# Patient Record
Sex: Female | Born: 1955 | Race: Black or African American | Hispanic: No | Marital: Single | Smoking: Former smoker
Health system: Southern US, Community
[De-identification: ages and names within clinical notes are randomized; demographics above are authoritative.]

## PROBLEM LIST (undated history)

## (undated) DIAGNOSIS — N840 Polyp of corpus uteri: Secondary | ICD-10-CM

## (undated) DIAGNOSIS — J45909 Unspecified asthma, uncomplicated: Secondary | ICD-10-CM

## (undated) DIAGNOSIS — M1990 Unspecified osteoarthritis, unspecified site: Secondary | ICD-10-CM

## (undated) DIAGNOSIS — M4316 Spondylolisthesis, lumbar region: Secondary | ICD-10-CM

## (undated) DIAGNOSIS — I10 Essential (primary) hypertension: Secondary | ICD-10-CM

## (undated) DIAGNOSIS — F1910 Other psychoactive substance abuse, uncomplicated: Secondary | ICD-10-CM

## (undated) DIAGNOSIS — J449 Chronic obstructive pulmonary disease, unspecified: Secondary | ICD-10-CM

## (undated) DIAGNOSIS — F419 Anxiety disorder, unspecified: Secondary | ICD-10-CM

## (undated) DIAGNOSIS — K8020 Calculus of gallbladder without cholecystitis without obstruction: Secondary | ICD-10-CM

## (undated) DIAGNOSIS — D649 Anemia, unspecified: Secondary | ICD-10-CM

## (undated) DIAGNOSIS — G8929 Other chronic pain: Secondary | ICD-10-CM

## (undated) DIAGNOSIS — F1410 Cocaine abuse, uncomplicated: Secondary | ICD-10-CM

## (undated) DIAGNOSIS — Z973 Presence of spectacles and contact lenses: Secondary | ICD-10-CM

## (undated) DIAGNOSIS — M21619 Bunion of unspecified foot: Secondary | ICD-10-CM

## (undated) DIAGNOSIS — L84 Corns and callosities: Secondary | ICD-10-CM

## (undated) DIAGNOSIS — R519 Headache, unspecified: Secondary | ICD-10-CM

## (undated) DIAGNOSIS — Z972 Presence of dental prosthetic device (complete) (partial): Secondary | ICD-10-CM

## (undated) DIAGNOSIS — A159 Respiratory tuberculosis unspecified: Secondary | ICD-10-CM

## (undated) DIAGNOSIS — F329 Major depressive disorder, single episode, unspecified: Secondary | ICD-10-CM

## (undated) DIAGNOSIS — R51 Headache: Secondary | ICD-10-CM

## (undated) DIAGNOSIS — F1010 Alcohol abuse, uncomplicated: Secondary | ICD-10-CM

## (undated) DIAGNOSIS — K219 Gastro-esophageal reflux disease without esophagitis: Secondary | ICD-10-CM

## (undated) DIAGNOSIS — G4700 Insomnia, unspecified: Secondary | ICD-10-CM

## (undated) DIAGNOSIS — F32A Depression, unspecified: Secondary | ICD-10-CM

## (undated) DIAGNOSIS — B1920 Unspecified viral hepatitis C without hepatic coma: Secondary | ICD-10-CM

## (undated) DIAGNOSIS — E785 Hyperlipidemia, unspecified: Secondary | ICD-10-CM

## (undated) HISTORY — PX: COLONOSCOPY: SHX174

## (undated) HISTORY — DX: Unspecified osteoarthritis, unspecified site: M19.90

## (undated) HISTORY — DX: Bunion of unspecified foot: M21.619

## (undated) HISTORY — PX: TUBAL LIGATION: SHX77

## (undated) HISTORY — DX: Major depressive disorder, single episode, unspecified: F32.9

## (undated) HISTORY — DX: Anemia, unspecified: D64.9

## (undated) HISTORY — DX: Other psychoactive substance abuse, uncomplicated: F19.10

## (undated) HISTORY — PX: MULTIPLE TOOTH EXTRACTIONS: SHX2053

## (undated) HISTORY — PX: FOOT SURGERY: SHX648

## (undated) HISTORY — DX: Depression, unspecified: F32.A

## (undated) HISTORY — DX: Corns and callosities: L84

## (undated) HISTORY — DX: Essential (primary) hypertension: I10

## (undated) HISTORY — DX: Hyperlipidemia, unspecified: E78.5

---

## 1983-10-11 DIAGNOSIS — A159 Respiratory tuberculosis unspecified: Secondary | ICD-10-CM

## 1983-10-11 HISTORY — DX: Respiratory tuberculosis unspecified: A15.9

## 2002-11-20 ENCOUNTER — Encounter: Payer: Self-pay | Admitting: Obstetrics & Gynecology

## 2002-11-20 ENCOUNTER — Ambulatory Visit (HOSPITAL_COMMUNITY): Admission: RE | Admit: 2002-11-20 | Discharge: 2002-11-20 | Payer: Self-pay | Admitting: Obstetrics & Gynecology

## 2002-12-26 ENCOUNTER — Encounter: Payer: Self-pay | Admitting: Obstetrics & Gynecology

## 2002-12-26 ENCOUNTER — Ambulatory Visit (HOSPITAL_COMMUNITY): Admission: RE | Admit: 2002-12-26 | Discharge: 2002-12-26 | Payer: Self-pay | Admitting: Obstetrics & Gynecology

## 2003-01-27 ENCOUNTER — Encounter: Admission: RE | Admit: 2003-01-27 | Discharge: 2003-01-27 | Payer: Self-pay | Admitting: Cardiology

## 2003-01-27 ENCOUNTER — Encounter: Payer: Self-pay | Admitting: Cardiology

## 2003-07-29 ENCOUNTER — Encounter: Admission: RE | Admit: 2003-07-29 | Discharge: 2003-07-29 | Payer: Self-pay | Admitting: Cardiology

## 2003-07-29 ENCOUNTER — Encounter: Payer: Self-pay | Admitting: Cardiology

## 2006-07-05 ENCOUNTER — Emergency Department (HOSPITAL_COMMUNITY): Admission: EM | Admit: 2006-07-05 | Discharge: 2006-07-05 | Payer: Self-pay | Admitting: Family Medicine

## 2006-07-17 ENCOUNTER — Ambulatory Visit: Payer: Self-pay | Admitting: Family Medicine

## 2006-07-24 ENCOUNTER — Ambulatory Visit: Payer: Self-pay | Admitting: Family Medicine

## 2006-07-25 ENCOUNTER — Ambulatory Visit: Payer: Self-pay | Admitting: *Deleted

## 2006-07-27 ENCOUNTER — Ambulatory Visit (HOSPITAL_COMMUNITY): Admission: RE | Admit: 2006-07-27 | Discharge: 2006-07-27 | Payer: Self-pay | Admitting: Family Medicine

## 2006-08-09 ENCOUNTER — Ambulatory Visit: Payer: Self-pay | Admitting: Family Medicine

## 2006-08-10 ENCOUNTER — Ambulatory Visit (HOSPITAL_COMMUNITY): Admission: RE | Admit: 2006-08-10 | Discharge: 2006-08-10 | Payer: Self-pay | Admitting: Family Medicine

## 2006-08-21 ENCOUNTER — Emergency Department (HOSPITAL_COMMUNITY): Admission: EM | Admit: 2006-08-21 | Discharge: 2006-08-21 | Payer: Self-pay | Admitting: Family Medicine

## 2006-08-24 ENCOUNTER — Ambulatory Visit: Payer: Self-pay | Admitting: Family Medicine

## 2006-09-28 ENCOUNTER — Ambulatory Visit: Payer: Self-pay | Admitting: Family Medicine

## 2006-09-29 ENCOUNTER — Ambulatory Visit (HOSPITAL_COMMUNITY): Admission: RE | Admit: 2006-09-29 | Discharge: 2006-09-29 | Payer: Self-pay | Admitting: Family Medicine

## 2006-11-09 ENCOUNTER — Ambulatory Visit: Payer: Self-pay | Admitting: Family Medicine

## 2006-11-27 ENCOUNTER — Ambulatory Visit: Payer: Self-pay | Admitting: Family Medicine

## 2006-12-06 ENCOUNTER — Ambulatory Visit: Payer: Self-pay | Admitting: Family Medicine

## 2006-12-07 ENCOUNTER — Ambulatory Visit (HOSPITAL_COMMUNITY): Admission: RE | Admit: 2006-12-07 | Discharge: 2006-12-07 | Payer: Self-pay | Admitting: Family Medicine

## 2007-01-26 ENCOUNTER — Ambulatory Visit: Payer: Self-pay | Admitting: Gastroenterology

## 2007-01-30 ENCOUNTER — Emergency Department (HOSPITAL_COMMUNITY): Admission: EM | Admit: 2007-01-30 | Discharge: 2007-01-30 | Payer: Self-pay | Admitting: Emergency Medicine

## 2007-02-16 ENCOUNTER — Ambulatory Visit: Payer: Self-pay | Admitting: Family Medicine

## 2007-02-20 ENCOUNTER — Ambulatory Visit (HOSPITAL_COMMUNITY): Admission: RE | Admit: 2007-02-20 | Discharge: 2007-02-20 | Payer: Self-pay | Admitting: Family Medicine

## 2007-03-22 ENCOUNTER — Ambulatory Visit: Payer: Self-pay | Admitting: Gastroenterology

## 2007-03-27 ENCOUNTER — Ambulatory Visit: Payer: Self-pay | Admitting: Gastroenterology

## 2007-05-29 ENCOUNTER — Emergency Department (HOSPITAL_COMMUNITY): Admission: EM | Admit: 2007-05-29 | Discharge: 2007-05-29 | Payer: Self-pay | Admitting: Emergency Medicine

## 2007-05-30 ENCOUNTER — Ambulatory Visit: Payer: Self-pay | Admitting: Family Medicine

## 2007-06-27 ENCOUNTER — Encounter (INDEPENDENT_AMBULATORY_CARE_PROVIDER_SITE_OTHER): Payer: Self-pay | Admitting: *Deleted

## 2007-07-25 ENCOUNTER — Ambulatory Visit: Payer: Self-pay | Admitting: Family Medicine

## 2007-08-22 ENCOUNTER — Encounter: Admission: RE | Admit: 2007-08-22 | Discharge: 2007-08-22 | Payer: Self-pay | Admitting: Neurosurgery

## 2007-10-16 ENCOUNTER — Emergency Department (HOSPITAL_COMMUNITY): Admission: EM | Admit: 2007-10-16 | Discharge: 2007-10-17 | Payer: Self-pay | Admitting: Emergency Medicine

## 2007-11-08 ENCOUNTER — Ambulatory Visit: Payer: Self-pay | Admitting: Family Medicine

## 2007-11-19 ENCOUNTER — Encounter: Admission: RE | Admit: 2007-11-19 | Discharge: 2007-11-19 | Payer: Self-pay | Admitting: Anesthesiology

## 2007-11-20 ENCOUNTER — Ambulatory Visit: Payer: Self-pay | Admitting: Anesthesiology

## 2007-12-17 ENCOUNTER — Encounter
Admission: RE | Admit: 2007-12-17 | Discharge: 2008-03-16 | Payer: Self-pay | Admitting: Physical Medicine & Rehabilitation

## 2008-01-08 ENCOUNTER — Ambulatory Visit: Payer: Self-pay | Admitting: Physical Medicine & Rehabilitation

## 2008-02-07 ENCOUNTER — Ambulatory Visit: Payer: Self-pay | Admitting: Physical Medicine & Rehabilitation

## 2008-02-28 ENCOUNTER — Encounter
Admission: RE | Admit: 2008-02-28 | Discharge: 2008-03-18 | Payer: Self-pay | Admitting: Physical Medicine & Rehabilitation

## 2008-03-14 ENCOUNTER — Emergency Department (HOSPITAL_COMMUNITY): Admission: EM | Admit: 2008-03-14 | Discharge: 2008-03-14 | Payer: Self-pay | Admitting: Emergency Medicine

## 2008-03-19 ENCOUNTER — Encounter
Admission: RE | Admit: 2008-03-19 | Discharge: 2008-06-09 | Payer: Self-pay | Admitting: Physical Medicine & Rehabilitation

## 2008-03-20 ENCOUNTER — Ambulatory Visit: Payer: Self-pay | Admitting: Physical Medicine & Rehabilitation

## 2008-04-02 ENCOUNTER — Other Ambulatory Visit: Payer: Self-pay

## 2008-04-02 ENCOUNTER — Inpatient Hospital Stay (HOSPITAL_COMMUNITY): Admission: AD | Admit: 2008-04-02 | Discharge: 2008-04-11 | Payer: Self-pay | Admitting: *Deleted

## 2008-04-03 ENCOUNTER — Ambulatory Visit: Payer: Self-pay | Admitting: *Deleted

## 2008-04-17 ENCOUNTER — Ambulatory Visit: Payer: Self-pay | Admitting: Physical Medicine & Rehabilitation

## 2008-06-06 ENCOUNTER — Ambulatory Visit: Payer: Self-pay | Admitting: Family Medicine

## 2008-06-06 LAB — CONVERTED CEMR LAB
AST: 26 units/L (ref 0–37)
Albumin: 3.9 g/dL (ref 3.5–5.2)
Alkaline Phosphatase: 99 units/L (ref 39–117)
Basophils Relative: 1 % (ref 0–1)
Eosinophils Absolute: 0.2 10*3/uL (ref 0.0–0.7)
Eosinophils Relative: 4 % (ref 0–5)
Free T4: 1.14 ng/dL (ref 0.89–1.80)
Glucose, Bld: 118 mg/dL — ABNORMAL HIGH (ref 70–99)
HCT: 41.3 % (ref 36.0–46.0)
Lymphs Abs: 2 10*3/uL (ref 0.7–4.0)
MCHC: 33.9 g/dL (ref 30.0–36.0)
MCV: 76.5 fL — ABNORMAL LOW (ref 78.0–100.0)
Neutrophils Relative %: 53 % (ref 43–77)
Platelets: 155 10*3/uL (ref 150–400)
Potassium: 4.2 meq/L (ref 3.5–5.3)
RDW: 14.8 % (ref 11.5–15.5)
Sodium: 140 meq/L (ref 135–145)
Total Bilirubin: 0.5 mg/dL (ref 0.3–1.2)
Total Protein: 7.2 g/dL (ref 6.0–8.3)
WBC: 5.5 10*3/uL (ref 4.0–10.5)

## 2008-06-09 ENCOUNTER — Ambulatory Visit: Payer: Self-pay | Admitting: Physical Medicine & Rehabilitation

## 2008-07-07 ENCOUNTER — Encounter
Admission: RE | Admit: 2008-07-07 | Discharge: 2008-07-07 | Payer: Self-pay | Admitting: Physical Medicine & Rehabilitation

## 2008-09-03 ENCOUNTER — Emergency Department (HOSPITAL_COMMUNITY): Admission: EM | Admit: 2008-09-03 | Discharge: 2008-09-03 | Payer: Self-pay | Admitting: Family Medicine

## 2008-11-25 ENCOUNTER — Ambulatory Visit: Payer: Self-pay | Admitting: Family Medicine

## 2008-12-15 ENCOUNTER — Emergency Department (HOSPITAL_COMMUNITY): Admission: EM | Admit: 2008-12-15 | Discharge: 2008-12-15 | Payer: Self-pay | Admitting: Emergency Medicine

## 2009-02-12 ENCOUNTER — Emergency Department (HOSPITAL_COMMUNITY): Admission: EM | Admit: 2009-02-12 | Discharge: 2009-02-12 | Payer: Self-pay | Admitting: Emergency Medicine

## 2009-02-23 ENCOUNTER — Ambulatory Visit: Payer: Self-pay | Admitting: Diagnostic Radiology

## 2009-02-23 ENCOUNTER — Emergency Department (HOSPITAL_BASED_OUTPATIENT_CLINIC_OR_DEPARTMENT_OTHER): Admission: EM | Admit: 2009-02-23 | Discharge: 2009-02-23 | Payer: Self-pay | Admitting: Emergency Medicine

## 2009-05-15 ENCOUNTER — Ambulatory Visit (HOSPITAL_COMMUNITY): Admission: RE | Admit: 2009-05-15 | Discharge: 2009-05-15 | Payer: Self-pay | Admitting: Family Medicine

## 2009-05-15 ENCOUNTER — Ambulatory Visit: Payer: Self-pay | Admitting: Family Medicine

## 2009-05-15 LAB — CONVERTED CEMR LAB
ALT: 19 units/L (ref 0–35)
AST: 22 units/L (ref 0–37)
Albumin: 3.9 g/dL (ref 3.5–5.2)
BUN: 7 mg/dL (ref 6–23)
CO2: 22 meq/L (ref 19–32)
Calcium: 9.1 mg/dL (ref 8.4–10.5)
Chloride: 108 meq/L (ref 96–112)
Potassium: 4.2 meq/L (ref 3.5–5.3)
Vit D, 25-Hydroxy: 53 ng/mL (ref 30–89)

## 2009-06-17 ENCOUNTER — Emergency Department (HOSPITAL_COMMUNITY): Admission: EM | Admit: 2009-06-17 | Discharge: 2009-06-17 | Payer: Self-pay | Admitting: Family Medicine

## 2009-06-18 HISTORY — PX: OTHER SURGICAL HISTORY: SHX169

## 2009-06-18 HISTORY — PX: METATARSAL OSTEOTOMY: SHX1641

## 2009-06-29 ENCOUNTER — Ambulatory Visit: Payer: Self-pay | Admitting: Family Medicine

## 2009-07-14 ENCOUNTER — Ambulatory Visit: Payer: Self-pay | Admitting: Family Medicine

## 2009-08-06 ENCOUNTER — Ambulatory Visit: Payer: Self-pay | Admitting: Family Medicine

## 2009-09-18 ENCOUNTER — Ambulatory Visit (HOSPITAL_COMMUNITY): Admission: RE | Admit: 2009-09-18 | Discharge: 2009-09-18 | Payer: Self-pay | Admitting: Obstetrics

## 2009-10-30 ENCOUNTER — Ambulatory Visit: Payer: Self-pay | Admitting: Family Medicine

## 2009-11-17 ENCOUNTER — Ambulatory Visit (HOSPITAL_COMMUNITY): Admission: RE | Admit: 2009-11-17 | Discharge: 2009-11-17 | Payer: Self-pay | Admitting: Family Medicine

## 2010-01-14 ENCOUNTER — Ambulatory Visit: Payer: Self-pay | Admitting: Family Medicine

## 2010-02-25 ENCOUNTER — Ambulatory Visit: Payer: Self-pay | Admitting: Family Medicine

## 2010-05-06 ENCOUNTER — Ambulatory Visit: Payer: Self-pay | Admitting: Family Medicine

## 2010-07-05 ENCOUNTER — Emergency Department (HOSPITAL_COMMUNITY): Admission: EM | Admit: 2010-07-05 | Discharge: 2010-07-05 | Payer: Self-pay | Admitting: Family Medicine

## 2010-08-12 ENCOUNTER — Emergency Department (HOSPITAL_COMMUNITY): Admission: EM | Admit: 2010-08-12 | Discharge: 2010-08-12 | Payer: Self-pay | Admitting: Family Medicine

## 2010-10-22 ENCOUNTER — Encounter (INDEPENDENT_AMBULATORY_CARE_PROVIDER_SITE_OTHER): Payer: Self-pay | Admitting: Family Medicine

## 2010-10-22 LAB — CONVERTED CEMR LAB
ALT: 23 units/L (ref 0–35)
AST: 22 units/L (ref 0–37)
Creatinine, Ser: 0.62 mg/dL (ref 0.40–1.20)
Microalb, Ur: 5.8 mg/dL — ABNORMAL HIGH (ref 0.00–1.89)
Sodium: 140 meq/L (ref 135–145)
Total Bilirubin: 0.3 mg/dL (ref 0.3–1.2)
Total Protein: 7.4 g/dL (ref 6.0–8.3)

## 2010-11-08 ENCOUNTER — Encounter
Admission: RE | Admit: 2010-11-08 | Discharge: 2010-11-09 | Payer: Self-pay | Source: Home / Self Care | Attending: Family Medicine | Admitting: Family Medicine

## 2010-11-11 ENCOUNTER — Ambulatory Visit: Payer: Medicaid Other | Attending: Family Medicine | Admitting: *Deleted

## 2010-11-11 DIAGNOSIS — E119 Type 2 diabetes mellitus without complications: Secondary | ICD-10-CM | POA: Insufficient documentation

## 2010-11-11 DIAGNOSIS — Z713 Dietary counseling and surveillance: Secondary | ICD-10-CM | POA: Insufficient documentation

## 2010-11-11 DIAGNOSIS — I10 Essential (primary) hypertension: Secondary | ICD-10-CM | POA: Insufficient documentation

## 2010-11-11 DIAGNOSIS — E669 Obesity, unspecified: Secondary | ICD-10-CM | POA: Insufficient documentation

## 2010-11-16 ENCOUNTER — Ambulatory Visit: Payer: Medicaid Other | Attending: Family Medicine | Admitting: Physical Therapy

## 2010-11-16 DIAGNOSIS — M545 Low back pain, unspecified: Secondary | ICD-10-CM | POA: Insufficient documentation

## 2010-11-16 DIAGNOSIS — IMO0001 Reserved for inherently not codable concepts without codable children: Secondary | ICD-10-CM | POA: Insufficient documentation

## 2010-11-16 DIAGNOSIS — M2569 Stiffness of other specified joint, not elsewhere classified: Secondary | ICD-10-CM | POA: Insufficient documentation

## 2010-11-23 ENCOUNTER — Ambulatory Visit: Payer: Medicaid Other | Admitting: Physical Therapy

## 2010-12-01 ENCOUNTER — Encounter: Payer: Self-pay | Admitting: Physical Therapy

## 2010-12-02 ENCOUNTER — Other Ambulatory Visit (HOSPITAL_COMMUNITY): Payer: Self-pay | Admitting: Family Medicine

## 2010-12-02 DIAGNOSIS — Z1231 Encounter for screening mammogram for malignant neoplasm of breast: Secondary | ICD-10-CM

## 2010-12-10 ENCOUNTER — Ambulatory Visit: Payer: Medicaid Other | Admitting: *Deleted

## 2010-12-15 ENCOUNTER — Ambulatory Visit
Admission: RE | Admit: 2010-12-15 | Discharge: 2010-12-15 | Disposition: A | Discharge status: Home / Self Care | Payer: Medicaid Other | Source: Ambulatory Visit | Attending: Family Medicine | Admitting: Family Medicine

## 2010-12-15 ENCOUNTER — Other Ambulatory Visit: Payer: Self-pay | Admitting: Family Medicine

## 2010-12-15 ENCOUNTER — Encounter: Payer: Medicaid Other | Attending: Family Medicine | Admitting: *Deleted

## 2010-12-15 DIAGNOSIS — Z713 Dietary counseling and surveillance: Secondary | ICD-10-CM | POA: Insufficient documentation

## 2010-12-15 DIAGNOSIS — I10 Essential (primary) hypertension: Secondary | ICD-10-CM | POA: Insufficient documentation

## 2010-12-15 DIAGNOSIS — E669 Obesity, unspecified: Secondary | ICD-10-CM | POA: Insufficient documentation

## 2010-12-15 DIAGNOSIS — E119 Type 2 diabetes mellitus without complications: Secondary | ICD-10-CM | POA: Insufficient documentation

## 2010-12-15 DIAGNOSIS — A159 Respiratory tuberculosis unspecified: Secondary | ICD-10-CM

## 2010-12-16 ENCOUNTER — Ambulatory Visit (HOSPITAL_COMMUNITY): Admission: RE | Admit: 2010-12-16 | Payer: Medicaid Other | Source: Ambulatory Visit

## 2011-01-14 LAB — GLUCOSE, CAPILLARY: Glucose-Capillary: 139 mg/dL — ABNORMAL HIGH (ref 70–99)

## 2011-01-18 LAB — RAPID URINE DRUG SCREEN, HOSP PERFORMED
Cocaine: POSITIVE — AB
Opiates: NOT DETECTED

## 2011-01-18 LAB — COMPREHENSIVE METABOLIC PANEL
ALT: 49 U/L — ABNORMAL HIGH (ref 0–35)
AST: 54 U/L — ABNORMAL HIGH (ref 0–37)
Albumin: 3.8 g/dL (ref 3.5–5.2)
Alkaline Phosphatase: 75 U/L (ref 39–117)
BUN: 6 mg/dL (ref 6–23)
CO2: 21 mEq/L (ref 19–32)
Calcium: 8.7 mg/dL (ref 8.4–10.5)
Chloride: 105 mEq/L (ref 96–112)
Creatinine, Ser: 0.54 mg/dL (ref 0.4–1.2)
GFR calc Af Amer: >60 mL/min (ref >60)
GFR calc non Af Amer: >60 mL/min (ref >60)
Glucose, Bld: 87 mg/dL (ref 70–99)
Potassium: 3.1 mEq/L — ABNORMAL LOW (ref 3.5–5.1)
Sodium: 136 mEq/L (ref 135–145)
Total Bilirubin: 0.5 mg/dL (ref 0.3–1.2)
Total Protein: 6.5 g/dL (ref 6.0–8.3)

## 2011-01-18 LAB — URINALYSIS, ROUTINE W REFLEX MICROSCOPIC
Nitrite: NEGATIVE
Protein, ur: NEGATIVE mg/dL
Urobilinogen, UA: 0.2 mg/dL (ref 0.0–1.0)

## 2011-01-18 LAB — CBC
HCT: 40.8 % (ref 36.0–46.0)
Platelets: 179 10*3/uL (ref 150–400)
RDW: 14.9 % (ref 11.5–15.5)

## 2011-01-18 LAB — DIFFERENTIAL
Basophils Absolute: 0.1 10*3/uL (ref 0.0–0.1)
Lymphocytes Relative: 45 % (ref 12–46)
Neutro Abs: 3 10*3/uL (ref 1.7–7.7)

## 2011-01-18 LAB — ETHANOL: Alcohol, Ethyl (B): 23 mg/dL — ABNORMAL HIGH (ref 0–10)

## 2011-01-18 LAB — POCT PREGNANCY, URINE: Preg Test, Ur: NEGATIVE

## 2011-01-18 LAB — DIGOXIN LEVEL: Digoxin Level: 0.2 ng/mL — ABNORMAL LOW (ref 0.8–2.0)

## 2011-02-10 ENCOUNTER — Ambulatory Visit: Payer: Medicaid Other | Admitting: *Deleted

## 2011-02-22 NOTE — Assessment & Plan Note (Signed)
Kara Kelley follows up today.  I last saw her on February 07, 2008.  She did  no show an appointment on Mar 04, 2008.  She did have some good results  with the right trochanteric bursa injection, which helped for at least a  month or so, but is starting to wear off.  She went to physical therapy  for a couple of visits, but because of some transportation issues did  not follow up for the scheduled 2 times a week for 8-week treatment  recommended by her therapist.   She has had no new medical complications in the interval time period.   Her past history is significant for C7-T1 facet arthritis seen on  cervical CT, and disk bulges at C5-C6 and C6-C7, but no central  stenosis.  She had some mild left C6-C7 foraminal stenosis.  Head CT was  negative.   She complains of some pain in bilateral hips making it difficult to lie  on her hips when she sleeps.   She has partial relief with Ultram ER 200 mg daily.   Her pain is rated at a 10/10, interfering with activity at 7/10 level.  Worse with walking, bending, and sitting.  Improves with medications and  injections.  She last worked in 2007, on disability since 2004.  Pain  interferes with bathing, meal prep, household duties, and shopping.   REVIEW OF SYSTEMS:  Positive for trouble walking, confusion, depression,  anxiety, constipation, poor appetite, shortness of breath, and limb  swelling.   Her blood pressure is 122/79, respirations 24, oxygen sating 90% on room  air.  In general, in no acute distress.  Orientation x3.  Affect is  anxious.  Gait, tends to shuffle but no evidence of toe drag or knee  instability.  Her body habitus is obese.   She has tenderness to palpation on bilateral upper trapezius area, as  well as lower lumbar area.  She has tenderness over the bilateral hip  area.  She has a good strength bilateral upper and lower extremities.  Normal deep tendon reflexes, upper and lower extremities.  Normal range  of motion,  upper and lower extremities.   Extremities show  no evidence of edema.   IMPRESSION:  1. Trochanteric bursitis, right greater than left side.  2. Cervical spondylosis without myelopathy.  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  3. Lumbar spondylosis without myelopathy.  4. Myofascial pain syndrome.   PLAN:  1. We will reinject her right hip today.  2. We will send her back to physical therapy.  3. Continue Ultram ER 200 mg p.o. daily.  Overall, would avoid      narcotic analgesics with history of alcohol abuse, and IV drug      abuse.  Denies any current use.     [name], M.D.  Electronically Signed    AEK/MedQ  D:  03/20/2008 13:54:55  T:  03/21/2008 03:39:48  Job #:  [id number]

## 2011-02-22 NOTE — Procedures (Signed)
Kara Kelley, Kara Kelley NO.:  [id number]   MEDICAL RECORD NO.:  [id number]           PATIENT TYPE:   LOCATION:                                 FACILITY:   PHYSICIAN:  [name], M.D.DATE OF BIRTH:  [birth year]   DATE OF PROCEDURE:  DATE OF DISCHARGE:                               OPERATIVE REPORT   INDICATIONS FOR PROCEDURE:  [name] complains of right hip pain.  Her  examination is significant for right trochanteric prominence pain to  palpation.  Pain is only partially responsive to medication management.   PROCEDURE IN DETAIL:  Informed consent was obtained after describing  risks and benefits of the procedure to the patient.  These include  bleeding, bruising, and infection.  She elects to proceed and has given  written consent.  The patient was placed in the left lateral decubitus  position, right eye greater trochanter marked and prepped with Betadine.  The area was entered with a 25-gauge, 3-inch needle.  Bone contact was  made.  A solution containing 1 mL of 40-mg/mL Depo-Medrol and 4 mL of 1%  lidocaine injected in a fan-like pattern after negative drawback for  blood.  The patient tolerated the procedure well.  Post-injection  instructions were given.  Return in one month.      [name], M.D.  Electronically Signed     AEK/MEDQ  D:  02/07/2008 17:23:54  T:  02/07/2008 18:47:49  Job:  [id number]

## 2011-02-22 NOTE — Procedures (Signed)
Kara Kelley, CARE NO.:  [id number]   MEDICAL RECORD NO.:  [id number]          PATIENT TYPE:  REC   LOCATION:  [facility]                         FACILITY:  [facility]   PHYSICIAN:  [name], M.D.DATE OF BIRTH:  March 25, 1956   DATE OF PROCEDURE:  DATE OF DISCHARGE:                               OPERATIVE REPORT   PROCEDURE:  Right hip trochanteric bursa injection.   INDICATION:  Right hip trochanteric bursitis has improved after prior  injection, but recurred, will need injection as she is starting with  physical therapy for TFL stretching and hip strengthening.   Her pain is persistent despite medication management and interferes with  activity including sleep.   PROCEDURE:  Informed consent was obtained after describing the risks and  benefits of the procedure with the patient.  These include bleeding,  bruising, and infection.  She elects to proceed and has given written  consent.  The patient was placed in the left lateral decubitus position.  The area marked and prepped with Betadine, entered with 25-gauge 3-1/2-  inch spinal needle.  Bone contact made.  Needle slightly withdrawn.  Solution containing 1 mL of 40 mL Depo-Medrol and 4 mL of 1% lidocaine  injected.  The patient tolerated the procedure well.  Postinjection  instructions given.      [name], M.D.  Electronically Signed     AEK/MEDQ  D:  04/17/2008 16:43:08  T:  04/18/2008 04:18:50  Job:  [id number]

## 2011-02-22 NOTE — Procedures (Signed)
NAMEZABRINA, Kara Kelley NO.:  [id number]   MEDICAL RECORD NO.:  [id number]          PATIENT TYPE:  REC   LOCATION:  [facility]                         FACILITY:  [facility]   PHYSICIAN:  [name], M.D.DATE OF BIRTH:  [birth year]   DATE OF PROCEDURE:  DATE OF DISCHARGE:                               OPERATIVE REPORT   Bilateral upper trapezius trigger point injection, right side done by  Dr. Elvera Lennox and left side done by myself.   INDICATION:  Myofascial pain syndrome of upper trapezius.  Pain is only  partially responsive to medication management and interferes with self-  care and mobility.   The patient is seated positionally.  Informed consent obtained.  Area  marked, prepped with Betadine, entered with 25-gauge inch and a half  needle inserted to 1-inch depth.  Positive twitch sign obtained at  multiple sites.  A 1 mL of lidocaine injected on each side.  The patient  tolerated procedure well.  Postinjection instructions given.      [name], M.D.  Electronically Signed     AEK/MEDQ  D:  06/09/2008 16:46:09  T:  06/10/2008 07:21:49  Job:  [id number]
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
## 2011-02-22 NOTE — Consult Note (Signed)
Consult requested for the management, for rehabilitation management of  back pain, neck pain, arm pain, hip pain and neck pain.  Onset of this  problem has been greater than five years ago.  She has no history of  trauma.  She has had no surgeries on her neck, or her back area, no  shoulder surgeries.  She has had neurosurgical evaluation by Dr. Gerlene Fee  in the fall of 2008 and she was not felt to be a good surgical  candidate.  She has had a CT of her cervical spine which I have reviewed  on image cast showing C7-T1 facet arthritis.  She has a bulge at C5-6  and C6-7 but no central stenosis.  She had mild left C6-7 foraminal  stenosis.  She has had a CT of the head done for headaches on October 16, 2007 during the ED visit which was negative for any acute process.  She  has had shoulder films bilaterally in November 2007 which were read as  negative.  She has had right hip films, April 19 of 2004, which was read  as negative.  She has had a MRI of the lumbar spine on 08/29/2003  showing degenerative disk disease L3-4 and L4-5.  There was facet  arthropathy at L4-5 but no evidence of foraminal or central stenosis.   Out of all these pain complaints she has had most problem with her neck  pain.  It goes across her upper shoulder area.  She does not have any  numbness or tingling in her hands and feet.  She has had no bowel or  bladder incontinence but has had some constipation.  Her average pain is  9/10, described as aching and constant interfering with activity at a  moderate to severe level.  Sleep is poor.  Pain interferes with shopping  room and household duties.   REVIEW OF SYSTEMS:  Positive for trouble walking, spasm, confusion,  depression, anxiety, negative for suicidal thoughts.  She has had  shortness of breath, wheezing, night sweats, weight gain.  She does  follow with her primary care physician on these issues.  She does have  COPD.   Past history also remarkable for  hepatitis C.  She has a history of  alcohol abuse.  She has a history of IV drug abuse.  No alcohol or IV  drug use for two years per her report.   Once seen by interventional pain, Dr. Stevphen Rochester on November 20, 2007.  She  had a UDS which showed the reported hydrocodone.  Dr. Stevphen Rochester did  recommend no narcotic analgesics given her prior history.   SOCIAL:  She is single, lives with her adult daughter.  Denies any  illegal drug use, no history of DUIs, no conviction of any crimes.   FAMILY HISTORY:  Heart disease.   EXAMINATION:  Blood pressure 116/82, pulse 87, respiratory rate 18,  oxygen saturations 99% room.  Is in  no acute distress, mood and affect  appropriate, orientation x3, gait is normal.   Neck has full range of motion. Cranial nerves 2 through 12 are intact.  She does have tenderness bilateral upper trapezius but not in the upper  back area.  Remainder of fibromyalgia tender points were negative.  She  has full range of motion at the shoulders, elbows, hands and wrists.  Has 5/5 strength in bilateral deltoid, biceps, triceps, grips.  She has  normal joint stability.  She had normal tone in the upper extremities.  In the lower extremities.  She has no evidence of peripheral edema.  She  has normal pulses.  She has no joint deformities.  She has no tenderness  to palpation in lower extremities at fibromyalgia points.  She is normal  joint stability, normal range of motion hips, knees and ankles.  5/5  strength bilateral hip flexors, knee extensors, ankle dorsiflexion,  great toe extensor.   Her lumbar range of motion is 75% forward flexion, 50% extension.  She  has pain at end range in both directions, however, more so in forward  flexion and extension.   Over the right hip she does have tenderness over the greater trochanter.   IMPRESSION:  1. Cervical spondylosis without myelopathy with cervical facet      syndrome.  2. Lumbar spondylosis without myelopathy.  3.  Myofascial pain, bilateral cervical trapezius.  4. Right trochanteric bursitis.   PLAN:  1. Will continue her Ultram ER started by Dr. Stevphen Rochester 200 mg a day.  2. A trigger point injections, bilateral upper traps.  3. Referral to physical therapy for cervical thoracic stabilization,      trial cervical traction home unit.  If successful, trial cervical      TENS unit home unit if successful.  4. I will see her back after she start physical therapy.  May need to      repeat hip film on the right given that she has had prior film 5      years ago.  No need for neck film at this point.  She may need some      imaging studies of the lumbar area.      [name], M.D.  Electronically Signed     AEK/MedQ  D:01/08/2008 12:57:59  T:01/08/2008 13:43:57  Job #:  [id number]
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
## 2011-02-22 NOTE — Procedures (Signed)
Kara Kelley, Kara Kelley NO.:  [id number]   MEDICAL RECORD NO.:  [id number]          PATIENT TYPE:  REC   LOCATION:  TPC                          FACILITY:  [facility]   PHYSICIAN:  [name], M.D.DATE OF BIRTH:  1956-10-05   DATE OF PROCEDURE:  DATE OF DISCHARGE:                               OPERATIVE REPORT   PROCEDURE:  Right hip trochanteric bursa injection.   INDICATION:  Right hip trochanteric bursitis previously relieved by  trochanteric bursa injection and only partially relieved by medication  management.   Informed consent was obtained after describing the risks and benefits of  the procedure with the patient.  These include bleeding, bruising, and  infection.  She elects to proceed and has given a written consent.  The  patient placed in left lateral decubitus position.  Area marked and  prepped with Betadine, entered with a 25 gauge 3-1/2-inch spinal needle  with stylet removed following negative drawback of  blood.  A solution  containing 1 mL of 40 mg/mL Depo-Medrol and 4 mL of 1% lidocaine  injected.  The patient tolerated the procedure well.  Post injection  instructions given.      [name], M.D.  Electronically Signed     AEK/MEDQ  D:  03/20/2008 13:56:04  T:  03/21/2008 03:57:37  Job:  [id number]

## 2011-02-22 NOTE — Assessment & Plan Note (Signed)
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  Ms. Blass's imaging  accompanies her, hard copy and reports are reviewed.  She has long  standing history of neck pain, suprascapular, levator scapular described  pain, difficulty with range of motion, bilateral arm pain, and diffuse  paralumbar discomfort, 7/10 subjective scale.  Dull, aching, throbbing,  no temporal relation of the day.  It is constant, interfering with sleep  and functional enhancements.  She does use a crutch, she has a right  foot injury, and this seems to aggravate her low back and her neck.  She  is followed by orthopedics.  She relates that she is currently not  working, does not see herself returning to work, and has some difficulty  with her normal activities.  Some numbness, bladder spasms, some  confusion and depression, but states no wish to harm self or others.  GI  is remarkable for some nausea, described as GERD, and she has trouble  taking some medications.  Also of note, performing a careful risk  inventory, notable hepatitis C and she is followed by primary care and  it sounds like gastroenterology, possibly hepatology, and states that  they are treating her cautiously.  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  She relates  that she is being monitored with her hydrocodone use, and is almost out,  but does find benefit from Tramadol.  She has not trialed  pharmacokinetically long acting agents such as Tramadol ER as an item of  diminished risk.   PAST MEDICAL HISTORY:  Also remarkable for early COPD, liver disease and  extensive arthritis.  She has also had an alcohol abuse history,  continues to smoke.  She is down to about 5 cigarettes a day and trying  to quit.   FAMILY HISTORY:  Lung disease, alcohol abuse, drug abuse.   SURGICAL HISTORY:  Noncontributory.  Fourteen point review  of systems  review.   REVIEW OF SYSTEMS, FAMILY AND SOCIAL HISTORY:  Otherwise noncontributory  to the pain problem.   PHYSICAL EXAMINATION:  Pleasant female, limping, using crutches, gait as  noted.  Affect is bright and engaging.  Appearance is normal, oriented  x3.  HEENT:  Remarkable for poor dentition.  CHEST:  Clear to auscultation and percussion with increased AP diameter.  ABDOMINAL EXAM:  Soft, nontender, benign.  No hepatosplenomegaly.  I  cannot feel a liver edge and she does not have any flank pain.  CARDIAC:  She has a regular rate and rhythm without murmur, rub or  gallop.  She has diffuse suprascapular paracervical and perilumbar myofascial  discomfort with positive cervicalfacetal compression test. Right and  left suboccipital compression test positive.  Side bending.  Range of  motion impaired secondary to pain.  She has good grip strength and  apparently good neurological presentation as I can find no obvious  deficits here.  This is a mixed mechanical diskogenic pain.   IMPRESSION:  1. 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  Degenerative spinal disease of lumbar spine.  2. History of intravenous drug abuse, previous drug abuse with      hepatitis C as consequence.  3. History of alcohol abuse.   PLAN:  1. Well health, modifiable features and health profile discussed such      as cigarette cessation  for best outcome.  She will maintain contact      with primary care.  2. I do not think she is an interventional candidate at this time.  We      want to maximize medical therapy, make sure she is followed by      primary care and her liver concerns are followed along as well.      This will take some time to get to know her.  3. I will start her on Ultram ER with cautions given.  I note she is      on an SSRI agent, but I think the risk or benefit is still in her      favor, although I reviewed this with her.  She  will stop the      hydrocodone and avoid acetaminophen and NSAIDs.  4. Follow up with her in one month.  Full informed consent UDS.  5. Consider muscle stimulator TENS unit for reeducation.  6. Maintain contact with primary care and she will let us know if      there are any further problems.  We will try to avoid controlled      substances if able, trying to use adjuncts and possible      interventional techniques.  Discharge instructions given.  No      barrier to communication.           ______________________________  Celene Kras, MD     HH/MedQ  D:  11/20/2007 10:39:14  T:  11/21/2007 21:21:41  Job #:  [id number]   cc:   Reinaldo Meeker, M.D.  Fax: [phone]

## 2011-02-22 NOTE — Assessment & Plan Note (Signed)
A 55 year old female.  She has a history of trochanteric bursitis as  well as tensor fasciae latae tightness.  I sent her for physical  therapy.  However, in the interval time, she had a relapse of her  alcoholism and started to smoking crack again and ended up in inpatient  drug rehab at [facility].  She has spent 31 days there and then was  discharged to outpatient where she follows up at [facility].  She has  a past history of cervical spondylosis without myelopathy as well as the  lumbar spondylosis without myelopathy.  She has myofascial pain in  bilateral cervical and trapezius area.   She has had trigger point injections in the past, January 08, 2008, which  was helpful.   Her current pain level is 8/10, interferes with activity at 5/10.  She  has spasms, confusion, depression, anxiety, nausea, constipation, and  wheezing.  She smokes a pack a day of cigarettes.  Denies any current  drug or alcohol use.  She is divorced.   PHYSICAL EXAMINATION:  VITAL SIGNS:  Her blood pressure initial 142/104,  recheck 129/95; pulse 74; respirations 18; and O2 sat 98% on room air.  GENERAL:  No acute distress.  Mood and affect appropriate.  She has  tenderness in bilateral upper trapezius.  She has range of motion in the  neck at 75% range, forward flexion, extension, lateral rotation, and  bending.  Her strength is 5/5 bilateral upper and lower extremities.  BACK:  Mild tenderness to palpation in lumbar paraspinal.   IMPRESSION:  1. Cervical myofascial pain, appears to be the most painful part.  2. Right hip trochanteric bursitis with tensor fascia latae tightness.      We will re-refer her to Physical Therapy given that she was not      able to go due to her drug rehab.  3. We will do some trigger-point injections today.  4. We will keep her on Ultram ER 200 mg per day.  No signs of early      refill request, certainly some potential for abuse but quite low      especially given the  drug of choice for this patient is not an      opiate.  We will monitor 30 days supply at a time.  We will see her      back in 30 days.      [name], M.D.  Electronically Signed     AEK/MedQ  D:  06/09/2008 16:44:54  T:  06/10/2008 07:31:36  Job #:  [id number]

## 2011-02-22 NOTE — Procedures (Signed)
NAMENEITA, LANDRIGAN NO.:  [id number]   MEDICAL RECORD NO.:  [id number]          PATIENT TYPE:  REC   LOCATION:  TPC                          FACILITY:  [facility]   PHYSICIAN:  [name], M.D.DATE OF BIRTH:  10/20/1955   DATE OF PROCEDURE:  01/08/2008  DATE OF DISCHARGE:                               OPERATIVE REPORT   PROCEDURE:  Bilateral cervical trapezius injection.   INDICATIONS:  Cervical myofascial pain identified by palpation of  trigger point mid clavicular line upper trap area.  Pain is only  partially responsive to medication management and other conservative  care and interferes with sleep and other activities.   Informed consent was obtained after describing risks and benefits of the  procedure to the patient.  These include bleeding, bruising and  infection.  She elects to proceed and has given written consent.  The  patient seated in chair, areas marked, prepped with Betadine and  alcohol, entered with 25 gauge needle, inch and a half, inserted to one  inch depth.  Positive twitch obtained, 1% lidocaine injected with a  trigger point deactivation performed.  This was repeated on the left  side.  The patient tolerated procedure well.  Post injection  instructions given.      [name], M.D.  Electronically Signed     AEK/MEDQ  D:  01/08/2008 13:00:10  T:  01/08/2008 13:35:47  Job:  [id number]

## 2011-04-05 ENCOUNTER — Inpatient Hospital Stay (INDEPENDENT_AMBULATORY_CARE_PROVIDER_SITE_OTHER)
Admission: RE | Admit: 2011-04-05 | Discharge: 2011-04-05 | Disposition: A | Discharge status: Home / Self Care | Payer: Medicaid Other | Source: Ambulatory Visit | Attending: Emergency Medicine | Admitting: Emergency Medicine

## 2011-04-05 DIAGNOSIS — L42 Pityriasis rosea: Secondary | ICD-10-CM

## 2011-05-21 ENCOUNTER — Inpatient Hospital Stay (HOSPITAL_COMMUNITY): Payer: Medicaid Other

## 2011-05-21 ENCOUNTER — Emergency Department (HOSPITAL_COMMUNITY): Payer: Medicaid Other

## 2011-05-21 ENCOUNTER — Inpatient Hospital Stay (EMERGENCY_DEPARTMENT_HOSPITAL)
Admission: AD | Admit: 2011-05-21 | Discharge: 2011-05-21 | Disposition: A | Discharge status: Home / Self Care | Payer: Medicaid Other | Source: Ambulatory Visit | Attending: Obstetrics & Gynecology | Admitting: Obstetrics & Gynecology

## 2011-05-21 ENCOUNTER — Emergency Department (HOSPITAL_COMMUNITY)
Admission: EM | Admit: 2011-05-21 | Discharge: 2011-05-21 | Disposition: A | Discharge status: Home / Self Care | Payer: Medicaid Other | Attending: Emergency Medicine | Admitting: Emergency Medicine

## 2011-05-21 ENCOUNTER — Encounter (HOSPITAL_COMMUNITY): Payer: Self-pay | Admitting: Obstetrics and Gynecology

## 2011-05-21 DIAGNOSIS — R109 Unspecified abdominal pain: Secondary | ICD-10-CM | POA: Insufficient documentation

## 2011-05-21 DIAGNOSIS — R1031 Right lower quadrant pain: Secondary | ICD-10-CM | POA: Insufficient documentation

## 2011-05-21 DIAGNOSIS — F341 Dysthymic disorder: Secondary | ICD-10-CM | POA: Insufficient documentation

## 2011-05-21 DIAGNOSIS — E119 Type 2 diabetes mellitus without complications: Secondary | ICD-10-CM | POA: Insufficient documentation

## 2011-05-21 DIAGNOSIS — R110 Nausea: Secondary | ICD-10-CM | POA: Insufficient documentation

## 2011-05-21 DIAGNOSIS — Z8619 Personal history of other infectious and parasitic diseases: Secondary | ICD-10-CM | POA: Insufficient documentation

## 2011-05-21 DIAGNOSIS — Z79899 Other long term (current) drug therapy: Secondary | ICD-10-CM | POA: Insufficient documentation

## 2011-05-21 HISTORY — DX: Unspecified viral hepatitis C without hepatic coma: B19.20

## 2011-05-21 LAB — WET PREP, GENITAL
Clue Cells Wet Prep HPF POC: NONE SEEN
Trich, Wet Prep: NONE SEEN

## 2011-05-21 LAB — COMPREHENSIVE METABOLIC PANEL
ALT: 65 U/L — ABNORMAL HIGH (ref 0–35)
AST: 42 U/L — ABNORMAL HIGH (ref 0–37)
Albumin: 3.6 g/dL (ref 3.5–5.2)
Alkaline Phosphatase: 111 U/L (ref 39–117)
BUN: 23 mg/dL (ref 6–23)
Chloride: 98 mEq/L (ref 96–112)
Potassium: 4.1 mEq/L (ref 3.5–5.1)
Sodium: 131 mEq/L — ABNORMAL LOW (ref 135–145)
Total Bilirubin: 0.3 mg/dL (ref 0.3–1.2)

## 2011-05-21 LAB — URINALYSIS, ROUTINE W REFLEX MICROSCOPIC
Bilirubin Urine: NEGATIVE
Glucose, UA: >1000 mg/dL — AB
Ketones, ur: NEGATIVE mg/dL
Leukocytes, UA: NEGATIVE
pH: 5.5 (ref 5.0–8.0)

## 2011-05-21 LAB — DIFFERENTIAL
Basophils Relative: 1 % (ref 0–1)
Monocytes Relative: 4 % (ref 3–12)
Neutro Abs: 3.3 10*3/uL (ref 1.7–7.7)
Neutrophils Relative %: 55 % (ref 43–77)

## 2011-05-21 LAB — GLUCOSE, CAPILLARY
Glucose-Capillary: 255 mg/dL — ABNORMAL HIGH (ref 70–99)
Glucose-Capillary: 371 mg/dL — ABNORMAL HIGH (ref 70–99)

## 2011-05-21 LAB — CBC
Hemoglobin: 13 g/dL (ref 12.0–15.0)
MCHC: 34.4 g/dL (ref 30.0–36.0)
Platelets: 167 10*3/uL (ref 150–400)
RBC: 4.86 MIL/uL (ref 3.87–5.11)

## 2011-05-21 LAB — URINE MICROSCOPIC-ADD ON

## 2011-05-21 MED ORDER — KETOROLAC TROMETHAMINE 60 MG/2ML IM SOLN
60.0000 mg | Freq: Once | INTRAMUSCULAR | Status: AC
  Administered 2011-05-21: 60 mg via INTRAMUSCULAR
  Filled 2011-05-21: qty 2

## 2011-05-21 MED ORDER — IOHEXOL 300 MG/ML  SOLN
125.0000 mL | Freq: Once | INTRAMUSCULAR | Status: AC | PRN
  Administered 2011-05-21: 125 mL via INTRAVENOUS

## 2011-05-21 NOTE — ED Provider Notes (Signed)
History     CSN: [id number] Arrival date & time: 05/21/2011 11:26 AM  Chief Complaint  Patient presents with  . Abdominal Pain   HPI Kara Kelley is a 55 y.o. female who presents to [facility] with right side abdominal pain. The pain started 3 days ago and has gotten worse. He last period was over a year ago and her last pap a year ago was normal. She describes the pain as sharp, cramping pain. She has nausea but no vomiting. She has a problem with constipation. She is not sexually active. The history was provided by the patient.     Past Medical History  Diagnosis Date  . Hepatitis C   . Diabetes mellitus   . Migraine     Past Surgical History  Procedure Date  . Cesarean section   . Foot surgery     No family history on file.  History  Substance Use Topics  . Smoking status: Current Everyday Smoker    Types: Cigarettes  . Smokeless tobacco: Not on file  . Alcohol Use: No    OB History    Grav Para Term Preterm Abortions TAB SAB Ect Mult Living   4 3   1 1    3       Review of Systems  Constitutional: Negative for fever.  Respiratory: Negative.   Cardiovascular: Negative.   Gastrointestinal: Positive for nausea and abdominal pain.  Genitourinary: Negative for dysuria, frequency, vaginal bleeding and vaginal discharge.  Musculoskeletal: Positive for back pain.  Skin: Negative.   Neurological: Negative for dizziness and headaches.    Physical Exam  BP 113/81  Pulse 92  Temp(Src) 98.1 F (36.7 C) (Oral)  Resp 18  Physical Exam  Nursing note and vitals reviewed. Constitutional: She is oriented to person, place, and time. She appears well-developed and well-nourished.  HENT:  Head: Normocephalic.  Eyes: EOM are normal.  Neck: Neck supple.  Pulmonary/Chest: Effort normal.  Abdominal: Soft. There is tenderness. There is no rebound and no guarding.  Genitourinary:       Small amount of white vaginal discharge. Right adnexal tenderness and fullness.     Musculoskeletal: Normal range of motion.  Neurological: She is alert and oriented to person, place, and time. No cranial nerve deficit.  Skin: Skin is warm and dry.    ED Course  Procedures Results for orders placed during the hospital encounter of 05/21/11 (from the past 24 hour(s))  URINALYSIS, ROUTINE W REFLEX MICROSCOPIC     Status: Abnormal   Collection Time   05/21/11 11:38 AM      Component Value Range   Color, Urine YELLOW  YELLOW    Appearance CLEAR  CLEAR    Specific Gravity, Urine <1.005 (*) 1.005 - 1.030    pH 5.5  5.0 - 8.0    Glucose, UA >1000 (*) NEGATIVE (mg/dL)   Hgb urine dipstick NEGATIVE  NEGATIVE    Bilirubin Urine NEGATIVE  NEGATIVE    Ketones, ur NEGATIVE  NEGATIVE (mg/dL)   Protein, ur NEGATIVE  NEGATIVE (mg/dL)   Urobilinogen, UA 0.2  0.0 - 1.0 (mg/dL)   Nitrite NEGATIVE  NEGATIVE    Leukocytes, UA NEGATIVE  NEGATIVE   URINE MICROSCOPIC-ADD ON     Status: Abnormal   Collection Time   05/21/11 11:38 AM      Component Value Range   Squamous Epithelial / LPF FEW (*) RARE    WBC, UA 0-2  <3 (WBC/hpf)   RBC / HPF  0-2  <3 (RBC/hpf)  POCT PREGNANCY, URINE     Status: Normal   Collection Time   05/21/11 11:42 AM      Component Value Range   Preg Test, Ur NEGATIVE    CBC     Status: Abnormal   Collection Time   05/21/11 12:40 PM      Component Value Range   WBC 6.0  4.0 - 10.5 (K/uL)   RBC 4.86  3.87 - 5.11 (MIL/uL)   Hemoglobin 13.0  12.0 - 15.0 (g/dL)   HCT 16.1  09.6 - 04.5 (%)   MCV 77.8 (*) 78.0 - 100.0 (fL)   MCH 26.7  26.0 - 34.0 (pg)   MCHC 34.4  30.0 - 36.0 (g/dL)   RDW 40.9  81.1 - 91.4 (%)   Platelets 167  150 - 400 (K/uL)  DIFFERENTIAL     Status: Abnormal   Collection Time   05/21/11 12:40 PM      Component Value Range   Neutrophils Relative 55  43 - 77 (%)   Neutro Abs 3.3  1.7 - 7.7 (K/uL)   Lymphocytes Relative 35  12 - 46 (%)   Lymphs Abs 2.1  0.7 - 4.0 (K/uL)   Monocytes Relative 4  3 - 12 (%)   Monocytes Absolute 0.2  0.1 - 1.0  (K/uL)   Eosinophils Relative 6 (*) 0 - 5 (%)   Eosinophils Absolute 0.4  0.0 - 0.7 (K/uL)   Basophils Relative 1  0 - 1 (%)   Basophils Absolute 0.0  0.0 - 0.1 (K/uL)  COMPREHENSIVE METABOLIC PANEL     Status: Abnormal   Collection Time   05/21/11 12:40 PM      Component Value Range   Sodium 131 (*) 135 - 145 (mEq/L)   Potassium 4.1  3.5 - 5.1 (mEq/L)   Chloride 98  96 - 112 (mEq/L)   CO2 21  19 - 32 (mEq/L)   Glucose, Bld 456 (*) 70 - 99 (mg/dL)   BUN 23  6 - 23 (mg/dL)   Creatinine, Ser 7.82  0.50 - 1.10 (mg/dL)   Calcium 95.6  8.4 - 10.5 (mg/dL)   Total Protein 7.6  6.0 - 8.3 (g/dL)   Albumin 3.6  3.5 - 5.2 (g/dL)   AST 42 (*) 0 - 37 (U/L)   ALT 65 (*) 0 - 35 (U/L)   Alkaline Phosphatase 111  39 - 117 (U/L)   Total Bilirubin 0.3  0.3 - 1.2 (mg/dL)   GFR calc non Af Amer 53 (*) >60 (mL/min)   GFR calc Af Amer >60  >60 (mL/min)  WET PREP, GENITAL     Status: Abnormal   Collection Time   05/21/11  1:15 PM      Component Value Range   Yeast, Wet Prep NONE SEEN  NONE SEEN    Trich, Wet Prep NONE SEEN  NONE SEEN    Clue Cells, Wet Prep NONE SEEN  NONE SEEN    WBC, Wet Prep HPF POC FEW (*) NONE SEEN     US Pelvis Complete   Status: Final result    Study Result     *RADIOLOGY REPORT*   Clinical Data: Pelvic pain   TRANSABDOMINAL AND TRANSVAGINAL ULTRASOUND OF PELVIS Technique:  Both transabdominal and transvaginal ultrasound examinations of the pelvis were performed. Transabdominal technique was performed for global imaging of the pelvis including uterus, ovaries, adnexal regions, and pelvic cul-de-sac.   Comparison: 12/26/2002 by report only  It was necessary to proceed with endovaginal exam following the transabdominal exam to visualize the endometrium and right ovary.   Findings:   Uterus: 32 x 35x68 mm. No focal lesion. Echotexture in the region of the fundus and somewhat heterogeneous.   Endometrium: 7.3 mm in thickness, incompletely visualized.   Right  ovary:  16 x 16 x 22 mm, unremarkable   Left ovary: 15 x 18 x 22 mm, unremarkable   Other findings: No free fluid.   IMPRESSION: 1.  Heterogeneous uterine echotexture in the fundus with incomplete visualization of the endometrium.  If there is clinical concern of adenomyosis, MR may be useful for further evaluation. 2.  Unremarkable ovaries.  No adnexal mass or free fluid.   Original Report Authenticated By: Osa Craver, M.D.     Imaging     US Pelvis Complete (Order #[id number]) on 05/21/2011 - Imaging Information      Imaging     Imaging Information       Signed by       Signed Date/Time   Phone Pager    Fayne Mediate DANIEL 05/21/2011   Consult with Dr. Macon Large and she does not feel the pain is GYN related. Will treat with toradol and have pt. Follow up with PCP.      Assessment: Abdominal pain                        Diabetes/hyperglycemia  Plan: Toradol 60 mg. IM           Discussed with patient ultrasound results and the fact that adenomyosis usually is associated with DUB and does not usually present this way. Discussed her elevated blood sugar and need for follow up. Discussed other possible reasons for abdominal pain. She states that she wants to go to [facility] for further evaluation. I will call the EDP. Patient's  Family is here with her and will drive her there.  Consult with Dr. Darrin Luis at [facility] to notify of possibility of patient coming there for evaluation.        [place], [place] 05/21/11 1447

## 2011-05-21 NOTE — Progress Notes (Signed)
Pt presents to [facility]U with complaints of right sided abdominal pain. Pt states the pain started a couple of days ago. No vaginal bleeding

## 2011-05-25 NOTE — ED Provider Notes (Signed)
Agree with above note.  Kara Kelley A 05/25/2011 9:17 AM

## 2011-06-04 ENCOUNTER — Inpatient Hospital Stay (INDEPENDENT_AMBULATORY_CARE_PROVIDER_SITE_OTHER)
Admission: RE | Admit: 2011-06-04 | Discharge: 2011-06-04 | Disposition: A | Discharge status: Home / Self Care | Payer: Medicaid Other | Source: Ambulatory Visit | Attending: Emergency Medicine | Admitting: Emergency Medicine

## 2011-06-04 DIAGNOSIS — E119 Type 2 diabetes mellitus without complications: Secondary | ICD-10-CM

## 2011-06-04 LAB — GLUCOSE, CAPILLARY: Glucose-Capillary: 359 mg/dL — ABNORMAL HIGH (ref 70–99)

## 2011-06-04 LAB — POCT I-STAT, CHEM 8
BUN: 11 mg/dL (ref 6–23)
Calcium, Ion: 1.24 mmol/L (ref 1.12–1.32)
Creatinine, Ser: 0.8 mg/dL (ref 0.50–1.10)
Hemoglobin: 15 g/dL (ref 12.0–15.0)
Sodium: 137 mEq/L (ref 135–145)
TCO2: 24 mmol/L (ref 0–100)

## 2011-06-05 ENCOUNTER — Emergency Department (HOSPITAL_COMMUNITY)
Admission: EM | Admit: 2011-06-05 | Discharge: 2011-06-06 | Disposition: A | Discharge status: Home / Self Care | Payer: Medicaid Other | Attending: Emergency Medicine | Admitting: Emergency Medicine

## 2011-06-05 DIAGNOSIS — Z8619 Personal history of other infectious and parasitic diseases: Secondary | ICD-10-CM | POA: Insufficient documentation

## 2011-06-05 DIAGNOSIS — E119 Type 2 diabetes mellitus without complications: Secondary | ICD-10-CM | POA: Insufficient documentation

## 2011-06-05 DIAGNOSIS — F17200 Nicotine dependence, unspecified, uncomplicated: Secondary | ICD-10-CM | POA: Insufficient documentation

## 2011-06-05 DIAGNOSIS — R748 Abnormal levels of other serum enzymes: Secondary | ICD-10-CM | POA: Insufficient documentation

## 2011-06-05 LAB — GLUCOSE, CAPILLARY

## 2011-06-05 LAB — DIFFERENTIAL
Basophils Absolute: 0.1 10*3/uL (ref 0.0–0.1)
Eosinophils Relative: 5 % (ref 0–5)
Lymphocytes Relative: 48 % — ABNORMAL HIGH (ref 12–46)
Lymphs Abs: 3.6 10*3/uL (ref 0.7–4.0)
Neutro Abs: 3.1 10*3/uL (ref 1.7–7.7)
Neutrophils Relative %: 42 % — ABNORMAL LOW (ref 43–77)

## 2011-06-05 LAB — COMPREHENSIVE METABOLIC PANEL
ALT: 69 U/L — ABNORMAL HIGH (ref 0–35)
AST: 61 U/L — ABNORMAL HIGH (ref 0–37)
Albumin: 3.5 g/dL (ref 3.5–5.2)
Calcium: 9 mg/dL (ref 8.4–10.5)
Chloride: 100 mEq/L (ref 96–112)
Creatinine, Ser: 0.97 mg/dL (ref 0.50–1.10)
Sodium: 133 mEq/L — ABNORMAL LOW (ref 135–145)

## 2011-06-05 LAB — URINALYSIS, ROUTINE W REFLEX MICROSCOPIC
Bilirubin Urine: NEGATIVE
Glucose, UA: >1000 mg/dL — AB
Hgb urine dipstick: NEGATIVE
Specific Gravity, Urine: 1.023 (ref 1.005–1.030)
pH: 7 (ref 5.0–8.0)

## 2011-06-05 LAB — URINE MICROSCOPIC-ADD ON

## 2011-06-05 LAB — CBC
HCT: 35 % — ABNORMAL LOW (ref 36.0–46.0)
Platelets: 182 10*3/uL (ref 150–400)
RBC: 4.59 MIL/uL (ref 3.87–5.11)
RDW: 13 % (ref 11.5–15.5)
WBC: 7.5 10*3/uL (ref 4.0–10.5)

## 2011-06-05 LAB — BLOOD GAS, VENOUS
Bicarbonate: 22.5 mEq/L (ref 20.0–24.0)
O2 Saturation: 92.8 %
pCO2, Ven: 36.4 mmHg — ABNORMAL LOW (ref 45.0–50.0)
pH, Ven: 7.409 — ABNORMAL HIGH (ref 7.250–7.300)
pO2, Ven: 64.5 mmHg — ABNORMAL HIGH (ref 30.0–45.0)

## 2011-06-06 LAB — GLUCOSE, CAPILLARY: Glucose-Capillary: 238 mg/dL — ABNORMAL HIGH (ref 70–99)

## 2011-06-30 LAB — I-STAT 8, (EC8 V) (CONVERTED LAB)
Acid-base deficit: 3 — ABNORMAL HIGH
Bicarbonate: 20.2
HCT: 44
Operator id: 282201
TCO2: 21
pCO2, Ven: 31.2 — ABNORMAL LOW

## 2011-06-30 LAB — POCT I-STAT CREATININE: Creatinine, Ser: 0.5

## 2011-07-07 LAB — DIFFERENTIAL
Basophils Relative: 0
Monocytes Relative: 4
Neutro Abs: 6.1
Neutrophils Relative %: 78 — ABNORMAL HIGH

## 2011-07-07 LAB — CBC
MCHC: 33.5
RBC: 5.17 — ABNORMAL HIGH
WBC: 7.9

## 2011-07-07 LAB — BASIC METABOLIC PANEL
CO2: 21
Calcium: 8.9
Creatinine, Ser: 0.63
GFR calc Af Amer: >60

## 2011-07-07 LAB — ETHANOL: Alcohol, Ethyl (B): 57 — ABNORMAL HIGH

## 2011-07-07 LAB — RAPID URINE DRUG SCREEN, HOSP PERFORMED
Amphetamines: NOT DETECTED
Tetrahydrocannabinol: NOT DETECTED

## 2011-07-07 LAB — TRICYCLICS SCREEN, URINE: TCA Scrn: POSITIVE — AB

## 2011-07-18 ENCOUNTER — Inpatient Hospital Stay (HOSPITAL_COMMUNITY)
Admission: AD | Admit: 2011-07-18 | Discharge: 2011-07-18 | Disposition: A | Discharge status: Home / Self Care | Payer: Medicaid Other | Source: Ambulatory Visit | Attending: Obstetrics & Gynecology | Admitting: Obstetrics & Gynecology

## 2011-07-18 ENCOUNTER — Encounter (HOSPITAL_COMMUNITY): Payer: Self-pay | Admitting: *Deleted

## 2011-07-18 DIAGNOSIS — B373 Candidiasis of vulva and vagina: Secondary | ICD-10-CM

## 2011-07-18 DIAGNOSIS — B3731 Acute candidiasis of vulva and vagina: Secondary | ICD-10-CM | POA: Insufficient documentation

## 2011-07-18 LAB — WET PREP, GENITAL
Clue Cells Wet Prep HPF POC: NONE SEEN
Trich, Wet Prep: NONE SEEN
Yeast Wet Prep HPF POC: NONE SEEN

## 2011-07-18 MED ORDER — FLUCONAZOLE 150 MG PO TABS: ORAL_TABLET | ORAL | Status: DC

## 2011-07-18 MED ORDER — NYSTATIN-TRIAMCINOLONE 100000-0.1 UNIT/GM-% EX CREA: TOPICAL_CREAM | CUTANEOUS | Status: DC

## 2011-07-18 NOTE — Progress Notes (Signed)
Pt having vaginal rash and irritation x 1 wk.  Denies vag bleeding or discharge.

## 2011-07-18 NOTE — ED Provider Notes (Signed)
History   Pt presents today c/o severe vag irritation and itching that has worsened over the past week. She states it has now become difficult to sit. She denies fever, abd pain, or any other sx at this time. She states she has been scratching with paper towels and anything she can to relieve her sx.  Chief Complaint  Patient presents with  . Rash   HPI  OB History    Grav Para Term Preterm Abortions TAB SAB Ect Mult Living   4 3   1 1    3       Past Medical History  Diagnosis Date  . Hepatitis C   . Diabetes mellitus   . Migraine     Past Surgical History  Procedure Date  . Cesarean section   . Foot surgery     No family history on file.  History  Substance Use Topics  . Smoking status: Current Everyday Smoker    Types: Cigarettes  . Smokeless tobacco: Not on file  . Alcohol Use: No    Allergies: No Known Allergies  Prescriptions prior to admission  Medication Sig Dispense Refill  . amitriptyline (ELAVIL) 100 MG tablet Take 100 mg by mouth at bedtime.        . ARIPiprazole (ABILIFY) 2 MG tablet Take 2 mg by mouth daily.        . metFORMIN (GLUCOPHAGE) 850 MG tablet Take 850 mg by mouth 2 (two) times daily with a meal.        . omeprazole (PRILOSEC) 20 MG capsule Take 20 mg by mouth daily.        [name] spironolactone (ALDACTONE) 50 MG tablet Take 50 mg by mouth daily.        [name] zolpidem (AMBIEN) 10 MG tablet Take 10 mg by mouth at bedtime as needed. For sleep         Review of Systems  Constitutional: Negative for fever.  Cardiovascular: Negative for chest pain.  Gastrointestinal: Negative for nausea, vomiting, abdominal pain, diarrhea and constipation.  Genitourinary: Negative for dysuria, urgency, frequency and hematuria.  Neurological: Negative for dizziness and headaches.  Psychiatric/Behavioral: Negative for depression and suicidal ideas.   Physical Exam   Blood pressure 144/108, pulse 84, temperature 98.5 F (36.9 C), temperature source Oral, resp. rate  16, height 5' (1.524 m), weight 192 lb (87.091 kg).  Physical Exam  Nursing note and vitals reviewed. Constitutional: She is oriented to person, place, and time. She appears well-developed and well-nourished. No distress.  HENT:  Head: Normocephalic and atraumatic.  Eyes: EOM are normal. Pupils are equal, round, and reactive to light.  GI: Soft. She exhibits no distension. There is no tenderness. There is no rebound and no guarding.  Genitourinary:    There is erythema and tenderness around the vagina. No bleeding around the vagina. Vaginal discharge found.  Neurological: She is alert and oriented to person, place, and time.  Skin: Skin is warm and dry. She is not diaphoretic.  Psychiatric: She has a normal mood and affect. Her behavior is normal. Judgment and thought content normal.    [facility] Course  Procedures  Wet prep and GC/Chlamydia cultures done. Viral cultures obtained.  Results for orders placed during the hospital encounter of 07/18/11 (from the past 24 hour(s))  WET PREP, GENITAL     Status: Abnormal   Collection Time   07/18/11  9:20 PM      Component Value Range   Yeast, Wet Prep NONE SEEN  NONE SEEN    Trich, Wet Prep NONE SEEN  NONE SEEN    Clue Cells, Wet Prep NONE SEEN  NONE SEEN    WBC, Wet Prep HPF POC FEW (*) NONE SEEN      Assessment and Plan  Yeast: despite no evidence on wet prep, pt clinically has yeast. Will tx with diflucan and mycollog II cream. Discussed diet, activity, risks, and precautions.  Clinton Gallant. Rice III, DrHSc, MPAS, PA-C  07/18/2011, 9:26 PM   Henrietta Hoover, PA 07/18/11 2156

## 2011-07-19 LAB — GC/CHLAMYDIA PROBE AMP, GENITAL: Chlamydia, DNA Probe: NEGATIVE

## 2011-07-20 LAB — HERPES SIMPLEX VIRUS CULTURE

## 2011-07-22 LAB — POCT CARDIAC MARKERS
CKMB, poc: <1 — ABNORMAL LOW
Operator id: 272551
Troponin i, poc: <0.05

## 2011-07-22 LAB — I-STAT 8, (EC8 V) (CONVERTED LAB)
BUN: 6
Bicarbonate: 24
Glucose, Bld: 84
pCO2, Ven: 44.3 — ABNORMAL LOW
pH, Ven: 7.341 — ABNORMAL HIGH

## 2011-07-22 LAB — URINALYSIS, ROUTINE W REFLEX MICROSCOPIC
Bilirubin Urine: NEGATIVE
Nitrite: NEGATIVE
Specific Gravity, Urine: 1.005
pH: 7.5

## 2011-07-22 LAB — URINE MICROSCOPIC-ADD ON

## 2011-07-25 ENCOUNTER — Emergency Department (HOSPITAL_COMMUNITY)
Admission: EM | Admit: 2011-07-25 | Discharge: 2011-07-25 | Discharge status: Against Medical Advice | Payer: Medicaid Other | Attending: Emergency Medicine | Admitting: Emergency Medicine

## 2011-07-25 DIAGNOSIS — R7309 Other abnormal glucose: Secondary | ICD-10-CM | POA: Insufficient documentation

## 2011-07-25 LAB — GLUCOSE, CAPILLARY: Glucose-Capillary: 502 mg/dL — ABNORMAL HIGH (ref 70–99)

## 2012-01-12 ENCOUNTER — Ambulatory Visit: Payer: Medicaid Other | Admitting: Gastroenterology

## 2012-01-13 ENCOUNTER — Ambulatory Visit: Payer: Medicaid Other | Admitting: Gastroenterology

## 2012-01-28 ENCOUNTER — Emergency Department (HOSPITAL_COMMUNITY): Payer: Medicaid Other

## 2012-01-28 ENCOUNTER — Encounter (HOSPITAL_COMMUNITY): Payer: Self-pay | Admitting: Emergency Medicine

## 2012-01-28 ENCOUNTER — Emergency Department (HOSPITAL_COMMUNITY)
Admission: EM | Admit: 2012-01-28 | Discharge: 2012-01-28 | Disposition: A | Discharge status: Home / Self Care | Payer: Medicaid Other | Attending: Emergency Medicine | Admitting: Emergency Medicine

## 2012-01-28 DIAGNOSIS — X500XXA Overexertion from strenuous movement or load, initial encounter: Secondary | ICD-10-CM | POA: Insufficient documentation

## 2012-01-28 DIAGNOSIS — M79609 Pain in unspecified limb: Secondary | ICD-10-CM | POA: Insufficient documentation

## 2012-01-28 DIAGNOSIS — Z794 Long term (current) use of insulin: Secondary | ICD-10-CM | POA: Insufficient documentation

## 2012-01-28 DIAGNOSIS — E119 Type 2 diabetes mellitus without complications: Secondary | ICD-10-CM | POA: Insufficient documentation

## 2012-01-28 DIAGNOSIS — Z79899 Other long term (current) drug therapy: Secondary | ICD-10-CM | POA: Insufficient documentation

## 2012-01-28 DIAGNOSIS — Z8619 Personal history of other infectious and parasitic diseases: Secondary | ICD-10-CM | POA: Insufficient documentation

## 2012-01-28 DIAGNOSIS — R262 Difficulty in walking, not elsewhere classified: Secondary | ICD-10-CM | POA: Insufficient documentation

## 2012-01-28 DIAGNOSIS — S93609A Unspecified sprain of unspecified foot, initial encounter: Secondary | ICD-10-CM

## 2012-01-28 DIAGNOSIS — F17200 Nicotine dependence, unspecified, uncomplicated: Secondary | ICD-10-CM | POA: Insufficient documentation

## 2012-01-28 MED ORDER — IBUPROFEN 800 MG PO TABS
800.0000 mg | ORAL_TABLET | Freq: Once | ORAL | Status: AC
  Administered 2012-01-28: 800 mg via ORAL
  Filled 2012-01-28: qty 1

## 2012-01-28 NOTE — ED Provider Notes (Signed)
Medical screening examination/treatment/procedure(s) were performed by non-physician practitioner and as supervising physician I was immediately available for consultation/collaboration.   Gwyneth Sprout, MD 01/28/12 1948

## 2012-01-28 NOTE — Progress Notes (Signed)
Orthopedic Tech Progress Note Patient Details:  Kara Kelley Mar 08, 1956 [id number]  Other Ortho Devices Type of Ortho Device: Postop boot Ortho Device Interventions: Application   Cammer, Mickie Bail 01/28/2012, 1:24 PM

## 2012-01-28 NOTE — Discharge Instructions (Signed)
Foot Sprain  The muscles and cord like structures which attach muscle to bone (tendons) that surround the feet are made up of units. A foot sprain can occur at the weakest spot in any of these units. This condition is most often caused by injury to or overuse of the foot, as from playing contact sports, or aggravating a previous injury, or from poor conditioning, or obesity.  SYMPTOMS  · Pain with movement of the foot.  · Tenderness and swelling at the injury site.  · Loss of strength is present in moderate or severe sprains.  THE THREE GRADES OR SEVERITY OF FOOT SPRAIN ARE:  · Mild (Grade I): Slightly pulled muscle without tearing of muscle or tendon fibers or loss of strength.  · Moderate (Grade II): Tearing of fibers in a muscle, tendon, or at the attachment to bone, with small decrease in strength.  · Severe (Grade III): Rupture of the muscle-tendon-bone attachment, with separation of fibers. Severe sprain requires surgical repair. Often repeating (chronic) sprains are caused by overuse. Sudden (acute) sprains are caused by direct injury or over-use.  DIAGNOSIS   Diagnosis of this condition is usually by your own observation. If problems continue, a caregiver may be required for further evaluation and treatment. X-rays may be required to make sure there are not breaks in the bones (fractures) present. Continued problems may require physical therapy for treatment.  PREVENTION  · Use strength and conditioning exercises appropriate for your sport.  · Warm up properly prior to working out.  · Use athletic shoes that are made for the sport you are participating in.  · Allow adequate time for healing. Early return to activities makes repeat injury more likely, and can lead to an unstable arthritic foot that can result in prolonged disability. Mild sprains generally heal in 3 to 10 days, with moderate and severe sprains taking 2 to 10 weeks. Your caregiver can help you determine the proper time required for  healing.  HOME CARE INSTRUCTIONS   · Apply ice to the injury for 15 to 20 minutes, 3 to 4 times per day. Put the ice in a plastic bag and place a towel between the bag of ice and your skin.  · An elastic wrap (like an Ace bandage) may be used to keep swelling down.  · Keep foot above the level of the heart, or at least raised on a footstool, when swelling and pain are present.  · Try to avoid use other than gentle range of motion while the foot is painful. Do not resume use until instructed by your caregiver. Then begin use gradually, not increasing use to the point of pain. If pain does develop, decrease use and continue the above measures, gradually increasing activities that do not cause discomfort, until you gradually achieve normal use.  · Use crutches if and as instructed, and for the length of time instructed.  · Keep injured foot and ankle wrapped between treatments.  · Massage foot and ankle for comfort and to keep swelling down. Massage from the toes up towards the knee.  · Only take over-the-counter or prescription medicines for pain, discomfort, or fever as directed by your caregiver.  SEEK IMMEDIATE MEDICAL CARE IF:   · Your pain and swelling increase, or pain is not controlled with medications.  · You have loss of feeling in your foot or your foot turns cold or blue.  · You develop new, unexplained symptoms, or an increase of the symptoms that brought you   to your caregiver.  MAKE SURE YOU:   · Understand these instructions.  · Will watch your condition.  · Will get help right away if you are not doing well or get worse.  Document Released: 03/18/2002 Document Revised: 09/15/2011 Document Reviewed: 05/15/2008  Exit[employer]® Patient Information ©2012 [employer].

## 2012-01-28 NOTE — ED Notes (Signed)
Pt. Stated, I twisted my rt. Foot last night and I can't even walk on it.

## 2012-01-28 NOTE — ED Provider Notes (Signed)
History     CSN: [id number]  Arrival date & time 01/28/12  1141   First MD Initiated Contact with Patient 01/28/12 1213      Chief Complaint  Patient presents with  . Foot Pain    (Consider location/radiation/quality/duration/timing/severity/associated sxs/prior treatment) Patient is a 56 y.o. female presenting with lower extremity pain. The history is provided by the patient.  Foot Pain This is a new problem. The current episode started yesterday. The problem occurs constantly. The problem has been gradually worsening. Pertinent negatives include no arthralgias, fatigue, fever, myalgias, numbness or weakness. Associated symptoms comments: Pt has difficulty walking this morning on her right foot.. The symptoms are aggravated by walking. She has tried nothing for the symptoms.   Pt had a twisting injury last night and fell on her right foot.  She c/o pain in the middle aspect of her right foot.  She is diabetic but denies any foot ulcers.  She does see a podiatrist.  She denies any EtOH intake during accident.  She doesn't wear very supportive shoes.  She does have h/o of bunionectomy on her right foot.  Past Medical History  Diagnosis Date  . Hepatitis C   . Diabetes mellitus   . Migraine     Past Surgical History  Procedure Date  . Cesarean section   . Foot surgery     No family history on file.  History  Substance Use Topics  . Smoking status: Current Everyday Smoker    Types: Cigarettes  . Smokeless tobacco: Not on file  . Alcohol Use: No    OB History    Grav Para Term Preterm Abortions TAB SAB Ect Mult Living   4 3   1 1    3       Review of Systems  Constitutional: Negative for fever and fatigue.  Musculoskeletal: Negative for myalgias and arthralgias.  Neurological: Negative for weakness and numbness.  All other systems reviewed and are negative.    Allergies  Review of patient's allergies indicates no known allergies.  Home Medications   Current  Outpatient Rx  Name Route Sig Dispense Refill  . AMITRIPTYLINE HCL 100 MG PO TABS Oral Take 100 mg by mouth at bedtime.      . ARIPIPRAZOLE 2 MG PO TABS Oral Take 2 mg by mouth daily.      [name] HYDROCODONE-ACETAMINOPHEN 5-500 MG PO TABS Oral Take 1 tablet by mouth every 6 (six) hours as needed. For pain.    . INSULIN ASPART 100 UNIT/ML [place] SOLN Subcutaneous Inject 16 Units into the skin 3 (three) times daily before meals.    . INSULIN GLARGINE 100 UNIT/ML [place] SOLN Subcutaneous Inject 30 Units into the skin at bedtime.    . OMEPRAZOLE 20 MG PO CPDR Oral Take 20 mg by mouth daily.      [name] SPIRONOLACTONE 50 MG PO TABS Oral Take 50 mg by mouth daily.        BP 112/73  Pulse 95  Temp(Src) 98.2 F (36.8 C) (Oral)  Resp 22  SpO2 100%  Physical Exam  Nursing note and vitals reviewed. Constitutional: She is oriented to person, place, and time. She appears well-developed and well-nourished.  HENT:  Head: Normocephalic and atraumatic.  Eyes: Conjunctivae are normal. Pupils are equal, round, and reactive to light.  Musculoskeletal:       Right ankle: She exhibits no swelling, no ecchymosis, no deformity and normal pulse. tenderness. Head of 5th metatarsal tenderness found. No lateral malleolus,  no medial malleolus, no AITFL, no CF ligament, no posterior TFL and no proximal fibula tenderness found. Achilles tendon normal.       Feet:  Neurological: She is alert and oriented to person, place, and time.    ED Course  Procedures (including critical care time)  Labs Reviewed - No data to display Dg Foot Complete Right  01/28/2012  *RADIOLOGY REPORT*  Clinical Data: Twisted right foot last night, now with generalized right foot pain  RIGHT FOOT COMPLETE - 3+ VIEW  Comparison: None.  Findings: Postsurgical changes of the first and second metatarsal heads.  No definite evidence of hardware failure or loosening. Chronic deformity of the tuft of the great toe.  No definite fracture or dislocation.  There is  mild degenerative change of the first MTP joint with joint space loss and subchondral sclerosis. No definite erosions. Regional soft tissues are normal.  IMPRESSION: Postsurgical change of the first and second metatarsal heads without acute findings.  Original Report Authenticated By: Waynard Reeds, M.D.     1. Foot sprain       MDM   - Reviewed Xray with pt which showed no acute abnormality.  Advised RICE and ibuprofen 800mg  po tid for 5-7 days.  Instructed pt to wear supportive shoes.  If sx persist, f/u with PCP or return to ED.         Lindley Magnus [place], [place] 01/28/12 [phone]

## 2012-02-11 ENCOUNTER — Emergency Department (HOSPITAL_COMMUNITY)
Admission: EM | Admit: 2012-02-11 | Discharge: 2012-02-12 | Disposition: A | Discharge status: Home / Self Care | Payer: Medicaid Other | Attending: Emergency Medicine | Admitting: Emergency Medicine

## 2012-02-11 ENCOUNTER — Encounter (HOSPITAL_COMMUNITY): Payer: Self-pay | Admitting: Family Medicine

## 2012-02-11 DIAGNOSIS — L0231 Cutaneous abscess of buttock: Secondary | ICD-10-CM | POA: Insufficient documentation

## 2012-02-11 DIAGNOSIS — Z794 Long term (current) use of insulin: Secondary | ICD-10-CM | POA: Insufficient documentation

## 2012-02-11 DIAGNOSIS — L0291 Cutaneous abscess, unspecified: Secondary | ICD-10-CM

## 2012-02-11 DIAGNOSIS — B1920 Unspecified viral hepatitis C without hepatic coma: Secondary | ICD-10-CM | POA: Insufficient documentation

## 2012-02-11 DIAGNOSIS — L03317 Cellulitis of buttock: Secondary | ICD-10-CM | POA: Insufficient documentation

## 2012-02-11 DIAGNOSIS — F17200 Nicotine dependence, unspecified, uncomplicated: Secondary | ICD-10-CM | POA: Insufficient documentation

## 2012-02-11 DIAGNOSIS — M1990 Unspecified osteoarthritis, unspecified site: Secondary | ICD-10-CM | POA: Insufficient documentation

## 2012-02-11 DIAGNOSIS — E119 Type 2 diabetes mellitus without complications: Secondary | ICD-10-CM | POA: Insufficient documentation

## 2012-02-11 HISTORY — DX: Unspecified osteoarthritis, unspecified site: M19.90

## 2012-02-11 NOTE — ED Notes (Signed)
Patient states that she has a "boil between my butt cheeks since yesterday."

## 2012-02-11 NOTE — ED Notes (Signed)
Pt presented to the ER with c/o "boil on the butt" states noted yesterday however today got bigger

## 2012-02-12 MED ORDER — DOXYCYCLINE HYCLATE 100 MG PO CAPS: 100.0000 mg | ORAL_CAPSULE | Freq: Two times a day (BID) | ORAL | Status: AC

## 2012-02-12 MED ORDER — HYDROCODONE-ACETAMINOPHEN 5-325 MG PO TABS: 1.0000 | ORAL_TABLET | Freq: Four times a day (QID) | ORAL | Status: AC | PRN

## 2012-02-12 NOTE — ED Provider Notes (Signed)
Medical screening examination/treatment/procedure(s) were performed by non-physician practitioner and as supervising physician I was immediately available for consultation/collaboration.   Sunnie Nielsen, MD 02/12/12 [phone]

## 2012-02-12 NOTE — ED Provider Notes (Signed)
History     CSN: [id number]  Arrival date & time 02/11/12  2055   First MD Initiated Contact with Patient 02/11/12 2309     HPI Reports an abscess that developed yesterday. Reports abscess located on her right medial buttock. States it is very tender. Denies fever, difficulty with bowel movements, drainage. Reports h/o similar episode before.   Patient is a 56 y.o. female presenting with abscess.  Abscess  This is a new problem. The onset was gradual. The problem occurs continuously. The problem has been rapidly worsening. The abscess is present on the right buttock. The problem is severe. The abscess is characterized by painfulness. It is unknown what she was exposed to. Pertinent negatives include no fever, no diarrhea, no vomiting, no rhinorrhea and no cough.    Past Medical History  Diagnosis Date  . Hepatitis C   . Diabetes mellitus   . Migraine   . Degenerative joint disease     Past Surgical History  Procedure Date  . Cesarean section   . Foot surgery     No family history on file.  History  Substance Use Topics  . Smoking status: Current Everyday Smoker -- 0.5 packs/day    Types: Cigarettes  . Smokeless tobacco: Not on file  . Alcohol Use: No    OB History    Grav Para Term Preterm Abortions TAB SAB Ect Mult Living   4 3   1 1    3       Review of Systems  Constitutional: Negative for fever and chills.  HENT: Negative for rhinorrhea.   Eyes: Negative for redness.  Respiratory: Negative for cough, shortness of breath and wheezing.   Cardiovascular: Negative for chest pain and palpitations.  Gastrointestinal: Negative for nausea, vomiting and diarrhea.  Skin:       Abscess  All other systems reviewed and are negative.    Allergies  Review of patient's allergies indicates no known allergies.  Home Medications   Current Outpatient Rx  Name Route Sig Dispense Refill  . AMITRIPTYLINE HCL 100 MG PO TABS Oral Take 100 mg by mouth at bedtime.      .  ARIPIPRAZOLE 2 MG PO TABS Oral Take 2 mg by mouth daily.      . INSULIN ASPART 100 UNIT/ML [place] SOLN Subcutaneous Inject 16 Units into the skin 3 (three) times daily before meals.    . INSULIN GLARGINE 100 UNIT/ML [place] SOLN Subcutaneous Inject 30 Units into the skin at bedtime.    . OMEPRAZOLE 20 MG PO CPDR Oral Take 20 mg by mouth daily.      [name] SPIRONOLACTONE 50 MG PO TABS Oral Take 50 mg by mouth daily.        BP 110/69  Pulse 92  Temp(Src) 97.6 F (36.4 C) (Oral)  Resp 16  SpO2 96%  Physical Exam  Vitals reviewed. Constitutional: She is oriented to person, place, and time. Vital signs are normal. She appears well-developed and well-nourished. No distress.  HENT:  Head: Normocephalic and atraumatic.  Eyes: Pupils are equal, round, and reactive to light.  Neck: Neck supple.  Pulmonary/Chest: Effort normal.  Neurological: She is alert and oriented to person, place, and time.  Skin: Skin is warm and dry. No rash noted. No erythema. No pallor.       1cm fluctuant abscess on right medial buttock. TTP and drains easily. No erythema or perirectal abscess  Psychiatric: She has a normal mood and affect. Her behavior is normal.  ED Course  Procedures  INCISION AND DRAINAGE Performed by: Thomasene Lot Consent: Verbal consent obtained. Risks and benefits: risks, benefits and alternatives were discussed Type: abscess  Body area: right buttock  Anesthesia: local infiltration  Local anesthetic: lidocaine 2% 2 epinephrine  Anesthetic total: 3 ml  Complexity: complex Blunt dissection to break up loculations  Drainage: purulent  Drainage amount: moderate  Packing material: 1/4 in iodoform gauze  Patient tolerance: Patient tolerated the procedure well with no immediate complications.    MDM    Place patient on doxycycline and give script for vicodin. Advised packing removal in 3 days and hot sitz baths. Pt agrees with plan and is ready for d/c    Thomasene Lot,  PA-C 02/12/12 0028  Thomasene Lot, PA-C 02/12/12 [phone]

## 2012-03-06 ENCOUNTER — Emergency Department (HOSPITAL_COMMUNITY)
Admission: EM | Admit: 2012-03-06 | Discharge: 2012-03-06 | Disposition: A | Discharge status: Home / Self Care | Payer: Medicaid Other | Attending: Emergency Medicine | Admitting: Emergency Medicine

## 2012-03-06 DIAGNOSIS — E119 Type 2 diabetes mellitus without complications: Secondary | ICD-10-CM | POA: Insufficient documentation

## 2012-03-06 DIAGNOSIS — R079 Chest pain, unspecified: Secondary | ICD-10-CM | POA: Insufficient documentation

## 2012-03-06 DIAGNOSIS — F17200 Nicotine dependence, unspecified, uncomplicated: Secondary | ICD-10-CM | POA: Insufficient documentation

## 2012-03-06 DIAGNOSIS — R1110 Vomiting, unspecified: Secondary | ICD-10-CM | POA: Insufficient documentation

## 2012-03-06 DIAGNOSIS — M1990 Unspecified osteoarthritis, unspecified site: Secondary | ICD-10-CM | POA: Insufficient documentation

## 2012-03-06 DIAGNOSIS — Z79899 Other long term (current) drug therapy: Secondary | ICD-10-CM | POA: Insufficient documentation

## 2012-03-06 DIAGNOSIS — Z8619 Personal history of other infectious and parasitic diseases: Secondary | ICD-10-CM | POA: Insufficient documentation

## 2012-03-06 DIAGNOSIS — Z794 Long term (current) use of insulin: Secondary | ICD-10-CM | POA: Insufficient documentation

## 2012-03-06 DIAGNOSIS — K2920 Alcoholic gastritis without bleeding: Secondary | ICD-10-CM | POA: Insufficient documentation

## 2012-03-06 DIAGNOSIS — R10816 Epigastric abdominal tenderness: Secondary | ICD-10-CM | POA: Insufficient documentation

## 2012-03-06 DIAGNOSIS — F1410 Cocaine abuse, uncomplicated: Secondary | ICD-10-CM

## 2012-03-06 LAB — URINALYSIS, ROUTINE W REFLEX MICROSCOPIC
Glucose, UA: >1000 mg/dL — AB
Leukocytes, UA: NEGATIVE
Nitrite: NEGATIVE
Protein, ur: 100 mg/dL — AB
pH: 5 (ref 5.0–8.0)

## 2012-03-06 LAB — RAPID URINE DRUG SCREEN, HOSP PERFORMED
Amphetamines: NOT DETECTED
Benzodiazepines: NOT DETECTED
Cocaine: POSITIVE — AB

## 2012-03-06 LAB — DIFFERENTIAL
Basophils Absolute: 0 10*3/uL (ref 0.0–0.1)
Lymphocytes Relative: 20 % (ref 12–46)
Lymphs Abs: 2.5 10*3/uL (ref 0.7–4.0)
Neutrophils Relative %: 77 % (ref 43–77)

## 2012-03-06 LAB — COMPREHENSIVE METABOLIC PANEL
ALT: 43 U/L — ABNORMAL HIGH (ref 0–35)
AST: 38 U/L — ABNORMAL HIGH (ref 0–37)
CO2: 19 mEq/L (ref 19–32)
Chloride: 101 mEq/L (ref 96–112)
GFR calc non Af Amer: >90 mL/min (ref >90)
Potassium: 4.5 mEq/L (ref 3.5–5.1)
Sodium: 139 mEq/L (ref 135–145)
Total Bilirubin: 0.5 mg/dL (ref 0.3–1.2)

## 2012-03-06 LAB — TYPE AND SCREEN

## 2012-03-06 LAB — LIPASE, BLOOD: Lipase: 47 U/L (ref 11–59)

## 2012-03-06 LAB — CBC
Platelets: 194 10*3/uL (ref 150–400)
RBC: 6.31 MIL/uL — ABNORMAL HIGH (ref 3.87–5.11)
WBC: 12.5 10*3/uL — ABNORMAL HIGH (ref 4.0–10.5)

## 2012-03-06 LAB — GLUCOSE, CAPILLARY: Glucose-Capillary: 255 mg/dL — ABNORMAL HIGH (ref 70–99)

## 2012-03-06 LAB — PROTIME-INR
INR: 0.88 (ref 0.00–1.49)
Prothrombin Time: 12.1 seconds (ref 11.6–15.2)

## 2012-03-06 MED ORDER — SODIUM CHLORIDE 0.9 % IV SOLN
1000.0000 mL | INTRAVENOUS | Status: DC
  Administered 2012-03-06: 1000 mL via INTRAVENOUS

## 2012-03-06 MED ORDER — CHLORDIAZEPOXIDE HCL 10 MG PO CAPS: 10.0000 mg | ORAL_CAPSULE | Freq: Three times a day (TID) | ORAL | Status: DC | PRN

## 2012-03-06 MED ORDER — THIAMINE HCL 100 MG/ML IJ SOLN
100.0000 mg | Freq: Once | INTRAMUSCULAR | Status: AC
  Administered 2012-03-06: 100 mg via INTRAVENOUS
  Filled 2012-03-06: qty 2

## 2012-03-06 MED ORDER — MORPHINE SULFATE 4 MG/ML IJ SOLN
4.0000 mg | Freq: Once | INTRAMUSCULAR | Status: AC
  Administered 2012-03-06: 4 mg via INTRAVENOUS
  Filled 2012-03-06: qty 1

## 2012-03-06 MED ORDER — SODIUM CHLORIDE 0.9 % IV SOLN
1000.0000 mL | Freq: Once | INTRAVENOUS | Status: AC
  Administered 2012-03-06: 1000 mL via INTRAVENOUS

## 2012-03-06 MED ORDER — ONDANSETRON HCL 4 MG/2ML IJ SOLN
4.0000 mg | Freq: Once | INTRAMUSCULAR | Status: AC
  Administered 2012-03-06: 4 mg via INTRAVENOUS
  Filled 2012-03-06: qty 2

## 2012-03-06 MED ORDER — GI COCKTAIL ~~LOC~~
30.0000 mL | Freq: Once | ORAL | Status: AC
  Administered 2012-03-06: 30 mL via ORAL
  Filled 2012-03-06: qty 30

## 2012-03-06 MED ORDER — ONDANSETRON 8 MG PO TBDP: 8.0000 mg | ORAL_TABLET | Freq: Three times a day (TID) | ORAL | Status: AC | PRN

## 2012-03-06 MED ORDER — LORAZEPAM 2 MG/ML IJ SOLN
1.0000 mg | Freq: Once | INTRAMUSCULAR | Status: AC
  Administered 2012-03-06: 1 mg via INTRAVENOUS
  Filled 2012-03-06: qty 1

## 2012-03-06 NOTE — ED Notes (Signed)
This RN went to lunch at 1505, pt was under the care of Fleet Contras RN at this time

## 2012-03-06 NOTE — ED Notes (Signed)
MD at bedside. 

## 2012-03-06 NOTE — ED Provider Notes (Signed)
History     CSN: [id number]  Arrival date & time 03/06/12  1444   First MD Initiated Contact with Patient 03/06/12 1600      Chief Complaint  Patient presents with  . Chest Pain  . Emesis   HPI Pt had been smoking crack all night and had been drinking alcohol.She last had a drink at 10am.  She had started to vomit before that but she kept on drinking beer.  Pt has pain up in her chest area. The pain is  5/10 and a burning and pressure sensation. She also complains of a headache.  No fevers. She does feel short of breath.  The pain in the epigastric chest area has been constant since 10am.  No history of heart or lung problems.  Pt has hepatitic c but usually does not have any problems with that.  No heart problems or PE history.  Nothing seems to help.   Past Medical History  Diagnosis Date  . Hepatitis C   . Diabetes mellitus   . Migraine   . Degenerative joint disease     Past Surgical History  Procedure Date  . Cesarean section   . Foot surgery     No family history on file.  History  Substance Use Topics  . Smoking status: Current Everyday Smoker -- 0.5 packs/day    Types: Cigarettes  . Smokeless tobacco: Not on file  . Alcohol Use: No    OB History    Grav Para Term Preterm Abortions TAB SAB Ect Mult Living   4 3   1 1    3       Review of Systems  All other systems reviewed and are negative.    Allergies  Review of patient's allergies indicates no known allergies.  Home Medications   Current Outpatient Rx  Name Route Sig Dispense Refill  . AMITRIPTYLINE HCL 100 MG PO TABS Oral Take 100 mg by mouth at bedtime.      . ARIPIPRAZOLE 2 MG PO TABS Oral Take 2 mg by mouth daily.      . INSULIN ASPART 100 UNIT/ML [place] SOLN Subcutaneous Inject 16 Units into the skin 3 (three) times daily before meals.    . INSULIN GLARGINE 100 UNIT/ML [place] SOLN Subcutaneous Inject 30 Units into the skin at bedtime.    . OMEPRAZOLE 20 MG PO CPDR Oral Take 20 mg by mouth daily.        [name] SPIRONOLACTONE 50 MG PO TABS Oral Take 50 mg by mouth daily.        BP 146/92  Pulse 115  Temp(Src) 98.6 F (37 C) (Oral)  Resp 18  SpO2 93%  Physical Exam  Nursing note and vitals reviewed. Constitutional: No distress.  HENT:  Head: Normocephalic and atraumatic.  Right Ear: External ear normal.  Left Ear: External ear normal.  Eyes: Conjunctivae are normal. Right eye exhibits no discharge. Left eye exhibits no discharge. No scleral icterus.  Neck: Neck supple. No tracheal deviation present.  Cardiovascular: Normal rate, regular rhythm and intact distal pulses.   Pulmonary/Chest: Effort normal and breath sounds normal. No stridor. No respiratory distress. She has no wheezes. She has no rales.  Abdominal: Soft. Bowel sounds are normal. She exhibits no distension. There is tenderness in the epigastric area. There is no rebound and no guarding.  Musculoskeletal: She exhibits no edema and no tenderness.  Neurological: She is alert. She has normal strength. No sensory deficit. Cranial nerve deficit:  no  gross defecits noted. She exhibits normal muscle tone. She displays no seizure activity. Coordination normal.  Skin: Skin is warm and dry. No rash noted.  Psychiatric: She has a normal mood and affect.    ED Course  Procedures (including critical care time)  Labs Reviewed  CBC - Abnormal; Notable for the following:    WBC 12.5 (*)    RBC 6.31 (*)    Hemoglobin 17.0 (*)    HCT 47.9 (*)    MCV 75.9 (*)    All other components within normal limits  DIFFERENTIAL - Abnormal; Notable for the following:    Neutro Abs 9.6 (*)    All other components within normal limits  URINALYSIS, ROUTINE W REFLEX MICROSCOPIC - Abnormal; Notable for the following:    Specific Gravity, Urine 1.032 (*)    Glucose, UA >1000 (*)    Hgb urine dipstick TRACE (*)    Ketones, ur 40 (*)    Protein, ur 100 (*)    All other components within normal limits  APTT - Abnormal; Notable for the following:     aPTT 22 (*)    All other components within normal limits  URINE RAPID DRUG SCREEN (HOSP PERFORMED) - Abnormal; Notable for the following:    Cocaine POSITIVE (*)    All other components within normal limits  URINE MICROSCOPIC-ADD ON - Abnormal; Notable for the following:    Squamous Epithelial / LPF MANY (*)    All other components within normal limits  GLUCOSE, CAPILLARY - Abnormal; Notable for the following:    Glucose-Capillary 255 (*)    All other components within normal limits  COMPREHENSIVE METABOLIC PANEL - Abnormal; Notable for the following:    Glucose, Bld 247 (*)    AST 38 (*)    ALT 43 (*)    All other components within normal limits  LIPASE, BLOOD  ETHANOL  PROTIME-INR  TYPE AND SCREEN  POCT I-STAT TROPONIN I  ABO/RH   No results found.   1. Alcoholic gastritis   2. Cocaine abuse       MDM  The patient was given IV fluids and anti-emetics.  She improved while in the emergency department. She is tolerating oral fluids and is no longer having any discomfort. She does not appear to have any evidence of acute myocardial ischemia associated with her cocaine abuse. There is no evidence to suggest pancreatitis I doubt biliary obstruction.  I suspect the patient's symptoms were related to her alcohol and cocaine abuse.  Patient will be discharged home on medications for nausea. I also give her prescription for Librium in case she has some withdrawal symptoms.        Celene Kras, MD 03/06/12 2027

## 2012-03-06 NOTE — ED Notes (Signed)
Pt was smoking crack earlier and was drinking 40oz beers and has chest pain epigastric area with some sob, states that it is now constant. Holding chest while in traige.

## 2012-03-06 NOTE — Discharge Instructions (Signed)
Alcoholic Gastritis You have alcoholic gastritis. This is an inflammation of the lining of the stomach. It is caused by drinking alcohol. The symptoms may include: burning abdominal pain, nausea, vomiting or even vomiting blood. It can be made worse by a poor diet. People who drink frequently often do not eat well. Taking aspirin or other anti-inflammatory medications increases stomach irritation and bleeding. These medicines should be avoided. Treatment is aimed at the cause. You have to stop drinking alcohol if you want to get better. Eat a healthy, well-balanced diet. You may take liquid antacids as needed. Your caregiver may prescribe medications to help heal the stomach lining. Take these as prescribed. PREVENTION   Anyone who has experienced alcoholic gastritis should consider that alcoholism may be an issue. Professional evaluation is highly recommended.   Although some people can recover without help, most need assistance. With treatment and support, many are able to stop drinking and rebuild their lives. Long-term recovery is possible.   Alcohol Addiction cannot be cured, but it can be treated successfully. Treatment centers are listed in telephone listings under:   Alcoholism and Addiction Treatment; Substance Abuse Treatment or Cocaine, Narcotics and Alcoholics Anonymous. Most hospitals and clinics can refer you to a specialized care center.   The [place] government maintains a toll-free number for treatment referrals: (279)556-1299 or (281) 161-9254 (TDD). They also maintain a website: http://findtreatment.RockToxic.pl. Other websites for more information are: www.mentalhealth.RockToxic.pl and GreatestFeeling.tn.   In [place], treatment resources are listed in each province. Listings are available under:   Oncologist for [employer] or similar titles.  SEEK IMMEDIATE MEDICAL CARE IF:   You develop severe abdominal pain, uncontrolled vomiting, or vomiting blood.   You blackout or have  fainting spells.   You develop seizures (this could be life threatening).   You develop bloody stools or stools that appear black or tarry.  Document Released: 11/03/2004 Document Revised: 09/15/2011 Document Reviewed: 09/30/2009 [facility] Patient Information 2012 [place], [place].Cocaine Abuse PROBLEMS FROM USING COCAINE:   Highly addictive.   Illegal.   Risk of sudden death.   Heart disease.   Irregular heart beat.   High blood pressure.   Damage to nose and lungs.   Severe agitation.   Hallucinations.   Violent behavior.   Paranoia.   Sexual dysfunction.  Most cocaine users deny that they have a problem with addiction. The biggest problem is admitting that you are dependent on cocaine. Those trying to quit using it may experience depression and withdrawal symptoms. Other withdrawal symptoms include fatigue, suicidal thoughts, sleepiness, restlessness, anxiety, and increased craving for cocaine. There are medications available to help prevent depression associated with stopping cocaine. Most users will find a support group or treatment program helpful in coming off and staying off cocaine. The best chance to cure cocaine addiction is to go into group therapy and to be in a drug-free environment. It is very important to develop healthy relationships and avoid socializing with people who use or deal drugs. Eat well, and give your body the proper rest and healthy exercise it needs. You may need medication to help treat withdrawal symptoms. Call your caregiver or a drug treatment center for more help.  You may also want to call the [facility] on Drug Abuse at 800-662-HELP in the [place] SEEK IMMEDIATE MEDICAL CARE IF:  You develop severe chest pain.   You develop shortness of breath.   You develop extreme agitation.  Document Released: 11/03/2004 Document Revised: 09/15/2011 Document Reviewed: 07/29/2009 [employer] Patient Information  [address] [place].

## 2012-03-06 NOTE — ED Notes (Signed)
This Rn back from [employer]

## 2012-03-09 ENCOUNTER — Emergency Department (HOSPITAL_COMMUNITY)
Admission: EM | Admit: 2012-03-09 | Discharge: 2012-03-09 | Disposition: A | Discharge status: Home / Self Care | Payer: Medicaid Other | Attending: Emergency Medicine | Admitting: Emergency Medicine

## 2012-03-09 ENCOUNTER — Encounter (HOSPITAL_COMMUNITY): Payer: Self-pay | Admitting: Emergency Medicine

## 2012-03-09 ENCOUNTER — Emergency Department (HOSPITAL_COMMUNITY): Payer: Medicaid Other

## 2012-03-09 DIAGNOSIS — K2970 Gastritis, unspecified, without bleeding: Secondary | ICD-10-CM

## 2012-03-09 DIAGNOSIS — E119 Type 2 diabetes mellitus without complications: Secondary | ICD-10-CM | POA: Insufficient documentation

## 2012-03-09 DIAGNOSIS — K2990 Gastroduodenitis, unspecified, without bleeding: Secondary | ICD-10-CM | POA: Insufficient documentation

## 2012-03-09 DIAGNOSIS — R1013 Epigastric pain: Secondary | ICD-10-CM | POA: Insufficient documentation

## 2012-03-09 DIAGNOSIS — Z79899 Other long term (current) drug therapy: Secondary | ICD-10-CM | POA: Insufficient documentation

## 2012-03-09 LAB — URINE MICROSCOPIC-ADD ON

## 2012-03-09 LAB — DIFFERENTIAL
Basophils Relative: 1 % (ref 0–1)
Eosinophils Absolute: 0.1 10*3/uL (ref 0.0–0.7)
Eosinophils Relative: 2 % (ref 0–5)
Monocytes Relative: 6 % (ref 3–12)
Neutrophils Relative %: 52 % (ref 43–77)

## 2012-03-09 LAB — COMPREHENSIVE METABOLIC PANEL
Albumin: 3.6 g/dL (ref 3.5–5.2)
Alkaline Phosphatase: 97 U/L (ref 39–117)
BUN: 7 mg/dL (ref 6–23)
Calcium: 9 mg/dL (ref 8.4–10.5)
Potassium: 3.3 mEq/L — ABNORMAL LOW (ref 3.5–5.1)
Total Protein: 7.3 g/dL (ref 6.0–8.3)

## 2012-03-09 LAB — CBC
Hemoglobin: 14.8 g/dL (ref 12.0–15.0)
MCH: 26.8 pg (ref 26.0–34.0)
MCHC: 34.9 g/dL (ref 30.0–36.0)
Platelets: 131 10*3/uL — ABNORMAL LOW (ref 150–400)

## 2012-03-09 LAB — URINALYSIS, ROUTINE W REFLEX MICROSCOPIC
Bilirubin Urine: NEGATIVE
Leukocytes, UA: NEGATIVE
Nitrite: NEGATIVE
Specific Gravity, Urine: 1.025 (ref 1.005–1.030)
Urobilinogen, UA: 1 mg/dL (ref 0.0–1.0)
pH: 6 (ref 5.0–8.0)

## 2012-03-09 LAB — LIPASE, BLOOD: Lipase: 35 U/L (ref 11–59)

## 2012-03-09 MED ORDER — SODIUM CHLORIDE 0.9 % IV SOLN: Freq: Once | INTRAVENOUS | Status: DC

## 2012-03-09 MED ORDER — PANTOPRAZOLE SODIUM 40 MG IV SOLR
40.0000 mg | Freq: Once | INTRAVENOUS | Status: AC
  Administered 2012-03-09: 40 mg via INTRAVENOUS
  Filled 2012-03-09: qty 40

## 2012-03-09 MED ORDER — SUCRALFATE 1 G PO TABS: 1.0000 g | ORAL_TABLET | Freq: Four times a day (QID) | ORAL | Status: DC

## 2012-03-09 MED ORDER — SODIUM CHLORIDE 0.9 % IV BOLUS (SEPSIS)
1000.0000 mL | Freq: Once | INTRAVENOUS | Status: AC
  Administered 2012-03-09: 1000 mL via INTRAVENOUS

## 2012-03-09 MED ORDER — ONDANSETRON HCL 4 MG/2ML IJ SOLN
4.0000 mg | Freq: Once | INTRAMUSCULAR | Status: AC
  Administered 2012-03-09: 4 mg via INTRAVENOUS
  Filled 2012-03-09: qty 2

## 2012-03-09 MED ORDER — PANTOPRAZOLE SODIUM 20 MG PO TBEC: 40.0000 mg | DELAYED_RELEASE_TABLET | Freq: Every day | ORAL | Status: DC

## 2012-03-09 MED ORDER — GI COCKTAIL ~~LOC~~
30.0000 mL | Freq: Once | ORAL | Status: AC
  Administered 2012-03-09: 30 mL via ORAL
  Filled 2012-03-09: qty 30

## 2012-03-09 NOTE — ED Notes (Signed)
Pt states she was seen here on Tuesday for abdominal pain, N/V, and chest pain. She was sent home with nausea medicine but is not able to get in with her doctor until 03/21/12 so they told her to come back here since her symptoms are persisting.

## 2012-03-09 NOTE — Discharge Instructions (Signed)
Gastritis Gastritis is an inflammation (the body's way of reacting to injury and/or infection) of the stomach. It is often caused by viral or bacterial (germ) infections. It can also be caused by chemicals (including alcohol) and medications. This illness may be associated with generalized malaise (feeling tired, not well), cramps, and fever. The illness may last 2 to 7 days. If symptoms of gastritis continue, gastroscopy (looking into the stomach with a telescope-like instrument), biopsy (taking tissue samples), and/or blood tests may be necessary to determine the cause. Antibiotics will not affect the illness unless there is a bacterial infection present. One common bacterial cause of gastritis is an organism known as H. Pylori. This can be treated with antibiotics. Other forms of gastritis are caused by too much acid in the stomach. They can be treated with medications such as H2 blockers and antacids. Home treatment is usually all that is needed. Young children will quickly become dehydrated (loss of body fluids) if vomiting and diarrhea are both present. Medications may be given to control nausea. Medications are usually not given for diarrhea unless especially bothersome. Some medications slow the removal of the virus from the gastrointestinal tract. This slows down the healing process. HOME CARE INSTRUCTIONS Home care instructions for nausea and vomiting:  For adults: drink small amounts of fluids often. Drink at least 2 quarts a day. Take sips frequently. Do not drink large amounts of fluid at one time. This may worsen the nausea.   Only take over-the-counter or prescription medicines for pain, discomfort, or fever as directed by your caregiver.   Drink clear liquids only. Those are anything you can see through such as water, broth, or soft drinks.   Once you are keeping clear liquids down, you may start full liquids, soups, juices, and ice cream or sherbet. Slowly add bland (plain, not spicy)  foods to your diet.  Home care instructions for diarrhea:  Diarrhea can be caused by bacterial infections or a virus. Your condition should improve with time, rest, fluids, and/or anti-diarrheal medication.   Until your diarrhea is under control, you should drink clear liquids often in small amounts. Clear liquids include: water, broth, jell-o water and weak tea.  Avoid:  Milk.   Fruits.   Tobacco.   Alcohol.   Extremely hot or cold fluids.   Too much intake of anything at one time.  When your diarrhea stops you may add the following foods, which help the stool to become more formed:  Rice.   Bananas.   Apples without skin.   Dry toast.  Once these foods are tolerated you may add low-fat yogurt and low-fat cottage cheese. They will help to restore the normal bacterial balance in your bowel. Wash your hands well to avoid spreading bacteria (germ) or virus. SEEK IMMEDIATE MEDICAL CARE IF:   You are unable to keep fluids down.   Vomiting or diarrhea become persistent (constant).   Abdominal pain develops, increases, or localizes. (Right sided pain can be appendicitis. Left sided pain in adults can be diverticulitis.)   You develop a fever (an oral temperature above 102 F (38.9 C)).   Diarrhea becomes excessive or contains blood or mucus.   You have excessive weakness, dizziness, fainting or extreme thirst.   You are not improving or you are getting worse.   You have any other questions or concerns.  Document Released: 09/20/2001 Document Revised: 09/15/2011 Document Reviewed: 09/26/2005 [facility] Patient Information 2012 [place], [place].Esophagitis Esophagitis is inflammation of the esophagus. It can involve  swelling, soreness, and pain in the esophagus. This condition can make it difficult and painful to swallow. CAUSES  Most causes of esophagitis are not serious. Many different factors can cause esophagitis, including:  Gastroesophageal reflux disease (GERD). This  is when acid from your stomach flows up into the esophagus.   Recurrent vomiting.   An allergic-type reaction.   Certain medicines, especially those that come in large pills.   Ingestion of harmful chemicals, such as household cleaning products.   Heavy alcohol use.   An infection of the esophagus.   Radiation treatment for cancer.   Certain diseases such as sarcoidosis, Crohn's disease, and scleroderma. These diseases may cause recurrent esophagitis.  SYMPTOMS   Trouble swallowing.   Painful swallowing.   Chest pain.   Difficulty breathing.   Nausea.   Vomiting.   Abdominal pain.  DIAGNOSIS  Your caregiver will take your history and do a physical exam. Depending upon what your caregiver finds, certain tests may also be done, including:  Barium X-ray. You will drink a solution that coats the esophagus, and X-rays will be taken.   Endoscopy. A lighted tube is put down the esophagus so your caregiver can examine the area.   Allergy tests. These can sometimes be arranged through follow-up visits.  TREATMENT  Treatment will depend on the cause of your esophagitis. In some cases, steroids or other medicines may be given to help relieve your symptoms or to treat the underlying cause of your condition. Medicines that may be recommended include:  Viscous lidocaine, to soothe the esophagus.   Antacids.   Acid reducers.   Proton pump inhibitors.   Antiviral medicines for certain viral infections of the esophagus.   Antifungal medicines for certain fungal infections of the esophagus.   Antibiotic medicines, depending on the cause of the esophagitis.  HOME CARE INSTRUCTIONS   Avoid foods and drinks that seem to make your symptoms worse.   Eat small, frequent meals instead of large meals.   Avoid eating for the 3 hours prior to your bedtime.   If you have trouble taking pills, use a pill splitter to decrease the size and likelihood of the pill getting stuck or  injuring the esophagus on the way down. Drinking water after taking a pill also helps.   Stop smoking if you smoke.   Maintain a healthy weight.   Wear loose-fitting clothing. Do not wear anything tight around your waist that causes pressure on your stomach.   Raise the head of your bed 6 to 8 inches with wood blocks to help you sleep. Extra pillows will not help.   Only take over-the-counter or prescription medicines as directed by your caregiver.  SEEK IMMEDIATE MEDICAL CARE IF:  You have severe chest pain that radiates into your arm, neck, or jaw.   You feel sweaty, dizzy, or lightheaded.   You have shortness of breath.   You vomit blood.   You have difficulty or pain with swallowing.   You have bloody or black, tarry stools.   You have a fever.   You have a burning sensation in the chest more than 3 times a week for more than 2 weeks.   You cannot swallow, drink, or eat.   You drool because you cannot swallow your saliva.  MAKE SURE YOU:  Understand these instructions.   Will watch your condition.   Will get help right away if you are not doing well or get worse.  Document Released: 11/03/2004 Document Revised:  09/15/2011 Document Reviewed: 05/27/2011 [facility] Patient Information 2012 [place], [place].

## 2012-03-09 NOTE — ED Notes (Signed)
RN request to obtain labs with the start of IV 

## 2012-03-09 NOTE — ED Notes (Signed)
Pt was here this past Tuesday for the same c/o chest pain which is worse when she tries to eat/drink.  Also c/o top of mouth and throat being raw.

## 2012-03-09 NOTE — ED Provider Notes (Signed)
History     CSN: [id number]  Arrival date & time 03/09/12  1604   First MD Initiated Contact with Patient 03/09/12 1900      Chief Complaint  Patient presents with  . Chest Pain    (Consider location/radiation/quality/duration/timing/severity/associated sxs/prior treatment) Patient is a 56 y.o. female presenting with chest pain. The history is provided by the patient.  Chest Pain    patient here with worsening abdominal pain described as burning sensation in her epigastric area that started 2 days ago after binge drinking beer. 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 Symptoms continue and are worse when she tries to eat or drink. No fever or vomiting. No black or bloody stools. Nausea noted. Sensation is described as burning which starts in the epigastric area and radiates up to her chest. No anginal type symptoms  Past Medical History  Diagnosis Date  . Hepatitis C   . Diabetes mellitus   . Migraine   . Degenerative joint disease     Past Surgical History  Procedure Date  . Cesarean section   . Foot surgery     History reviewed. No pertinent family history.  History  Substance Use Topics  . Smoking status: Current Everyday Smoker -- 0.5 packs/day    Types: Cigarettes  . Smokeless tobacco: Not on file  . Alcohol Use: No    OB History    Grav Para Term Preterm Abortions TAB SAB Ect Mult Living   4 3   1 1    3       Review of Systems  Cardiovascular: Positive for chest pain.  All other systems reviewed and are negative.    Allergies  Review of patient's allergies indicates no known allergies.  Home Medications   Current Outpatient Rx  Name Route Sig Dispense Refill  . AMITRIPTYLINE HCL 100 MG PO TABS Oral Take 100 mg by mouth at bedtime.      . ARIPIPRAZOLE 2 MG PO TABS Oral Take 2 mg by mouth daily.      . INSULIN ASPART 100 UNIT/ML [place] SOLN Subcutaneous Inject 16 Units into the skin 3 (three) times daily before meals.    .  INSULIN GLARGINE 100 UNIT/ML [place] SOLN Subcutaneous Inject 30 Units into the skin at bedtime.    [name] ONDANSETRON 8 MG PO TBDP Oral Take 1 tablet (8 mg total) by mouth every 8 (eight) hours as needed for nausea. 20 tablet 0    BP 121/80  Pulse 95  Temp(Src) 99.2 F (37.3 C) (Oral)  Resp 18  SpO2 97%  Physical Exam  Nursing note and vitals reviewed. Constitutional: She is oriented to person, place, and time. She appears well-developed and well-nourished.  Non-toxic appearance. No distress.  HENT:  Head: Normocephalic and atraumatic.  Eyes: Conjunctivae, EOM and lids are normal. Pupils are equal, round, and reactive to light.  Neck: Normal range of motion. Neck supple. No tracheal deviation present. No mass present.  Cardiovascular: Normal rate, regular rhythm and normal heart sounds.  Exam reveals no gallop.   No murmur heard. Pulmonary/Chest: Effort normal and breath sounds normal. No stridor. No respiratory distress. She has no decreased breath sounds. She has no wheezes. She has no rhonchi. She has no rales.  Abdominal: Soft. Normal appearance and bowel sounds are normal. She exhibits no distension. There is tenderness in the epigastric area. There is no rigidity, no rebound, no guarding and no CVA tenderness.  Musculoskeletal: Normal range of motion. She exhibits  no edema and no tenderness.  Neurological: She is alert and oriented to person, place, and time. She has normal strength. No cranial nerve deficit or sensory deficit. GCS eye subscore is 4. GCS verbal subscore is 5. GCS motor subscore is 6.  Skin: Skin is warm and dry. No abrasion and no rash noted.  Psychiatric: She has a normal mood and affect. Her speech is normal and behavior is normal.    ED Course  Procedures (including critical care time)   Labs Reviewed  CBC  DIFFERENTIAL  COMPREHENSIVE METABOLIC PANEL  LIPASE, BLOOD  URINALYSIS, ROUTINE W REFLEX MICROSCOPIC   No results found.   No diagnosis found.    MDM   Pt given meds for gastritis and feels better, repeat abd exam remains stable, will d/c on carafate and ppi        Toy Baker, MD 03/09/12 2124

## 2012-04-16 ENCOUNTER — Emergency Department (HOSPITAL_COMMUNITY)
Admission: EM | Admit: 2012-04-16 | Discharge: 2012-04-16 | Disposition: A | Discharge status: Home / Self Care | Payer: Medicaid Other | Attending: Emergency Medicine | Admitting: Emergency Medicine

## 2012-04-16 ENCOUNTER — Emergency Department (HOSPITAL_COMMUNITY): Payer: Medicaid Other

## 2012-04-16 ENCOUNTER — Encounter (HOSPITAL_COMMUNITY): Payer: Self-pay | Admitting: *Deleted

## 2012-04-16 DIAGNOSIS — Z794 Long term (current) use of insulin: Secondary | ICD-10-CM | POA: Insufficient documentation

## 2012-04-16 DIAGNOSIS — Y92009 Unspecified place in unspecified non-institutional (private) residence as the place of occurrence of the external cause: Secondary | ICD-10-CM | POA: Insufficient documentation

## 2012-04-16 DIAGNOSIS — S6990XA Unspecified injury of unspecified wrist, hand and finger(s), initial encounter: Secondary | ICD-10-CM

## 2012-04-16 DIAGNOSIS — Z79899 Other long term (current) drug therapy: Secondary | ICD-10-CM | POA: Insufficient documentation

## 2012-04-16 DIAGNOSIS — W2209XA Striking against other stationary object, initial encounter: Secondary | ICD-10-CM | POA: Insufficient documentation

## 2012-04-16 DIAGNOSIS — E119 Type 2 diabetes mellitus without complications: Secondary | ICD-10-CM | POA: Insufficient documentation

## 2012-04-16 DIAGNOSIS — F17200 Nicotine dependence, unspecified, uncomplicated: Secondary | ICD-10-CM | POA: Insufficient documentation

## 2012-04-16 MED ORDER — HYDROCODONE-ACETAMINOPHEN 5-325 MG PO TABS: ORAL_TABLET | ORAL | Status: AC

## 2012-04-16 MED ORDER — HYDROCODONE-ACETAMINOPHEN 5-325 MG PO TABS
1.0000 | ORAL_TABLET | Freq: Once | ORAL | Status: AC
  Administered 2012-04-16: 1 via ORAL
  Filled 2012-04-16: qty 1

## 2012-04-16 MED ORDER — IBUPROFEN 800 MG PO TABS: 800.0000 mg | ORAL_TABLET | Freq: Three times a day (TID) | ORAL | Status: AC | PRN

## 2012-04-16 NOTE — ED Notes (Signed)
Pt reports hitting her rt hand on the bathroom wall this a.m. When it was dark - pt reports noted some swelling to her palm that has not gone down. Pt w/ increased pain over the day w/ sharp shooting pains radiating to her elbow. CMS intact - no obvious deformities noted.

## 2012-04-16 NOTE — ED Notes (Signed)
Rx given x2 Pt ambulating independently w/ steady gait on d/c in no acute distress, A&Ox4. D/c instructions reviewed w/ pt and family - pt and family deny any further questions or concerns at present.  

## 2012-04-16 NOTE — ED Provider Notes (Signed)
History     CSN: [id number]  Arrival date & time 04/16/12  2031   First MD Initiated Contact with Patient 04/16/12 2114      Chief Complaint  Patient presents with  . Hand Injury    (Consider location/radiation/quality/duration/timing/severity/associated sxs/prior treatment) HPI Comments: Patient presents with complaint of right hand injury. Patient states that this morning she struck her right hand against the wall in her bathroom. She has had pain in her hand and wrist that shoots into her forearm since that time. Patient states that she took ibuprofen without any relief. She denies trouble moving her wrist, elbow, or shoulder. She denies other injuries. Movement makes the symptoms worse. Nothing makes the symptoms better. Onset was acute. Course is constant.  Patient is a 56 y.o. female presenting with hand injury. The history is provided by the patient.  Hand Injury  The incident occurred 12 to 24 hours ago. The incident occurred at home. The injury mechanism was a direct blow. The pain is present in the right hand. The quality of the pain is described as aching. The pain is moderate. The pain has been constant since the incident. She has tried NSAIDs for the symptoms. The treatment provided no relief.    Past Medical History  Diagnosis Date  . Hepatitis C   . Diabetes mellitus   . Migraine   . Degenerative joint disease     Past Surgical History  Procedure Date  . Cesarean section   . Foot surgery     History reviewed. No pertinent family history.  History  Substance Use Topics  . Smoking status: Current Everyday Smoker -- 0.5 packs/day    Types: Cigarettes  . Smokeless tobacco: Not on file  . Alcohol Use: No    OB History    Grav Para Term Preterm Abortions TAB SAB Ect Mult Living   4 3   1 1    3       Review of Systems  Constitutional: Negative for activity change.  HENT: Negative for neck pain.   Musculoskeletal: Positive for arthralgias. Negative for  back pain and joint swelling.  Skin: Negative for wound.  Neurological: Negative for weakness and numbness.    Allergies  Review of patient's allergies indicates no known allergies.  Home Medications   Current Outpatient Rx  Name Route Sig Dispense Refill  . ALBUTEROL SULFATE HFA 108 (90 BASE) MCG/ACT IN AERS Inhalation Inhale 2 puffs into the lungs every 6 (six) hours as needed. For shortness of breath.    . ARIPIPRAZOLE 2 MG PO TABS Oral Take 2 mg by mouth daily.      . INSULIN ASPART 100 UNIT/ML [place] SOLN Subcutaneous Inject 16 Units into the skin 3 (three) times daily before meals.    . INSULIN GLARGINE 100 UNIT/ML [place] SOLN Subcutaneous Inject 30 Units into the skin at bedtime.    . ADULT MULTIVITAMIN W/MINERALS CH Oral Take 1 tablet by mouth daily.    [name] NAPROXEN SODIUM 220 MG PO TABS Oral Take 440 mg by mouth 2 (two) times daily as needed. For pain.    [name] PANTOPRAZOLE SODIUM 40 MG PO TBEC Oral Take 40 mg by mouth daily.    . SUCRALFATE 1 G PO TABS Oral Take 1 tablet (1 g total) by mouth 4 (four) times daily. 30 tablet 0    BP 134/98  Pulse 85  Temp 98.7 F (37.1 C) (Oral)  Resp 18  SpO2 98%  Physical Exam  Nursing note  and vitals reviewed. Constitutional: She appears well-developed and well-nourished.  HENT:  Head: Normocephalic and atraumatic.  Eyes: Pupils are equal, round, and reactive to light.  Neck: Normal range of motion. Neck supple.  Cardiovascular: Exam reveals no decreased pulses.   Musculoskeletal: She exhibits tenderness. She exhibits no edema.       Right wrist: Normal. She exhibits normal range of motion and no tenderness (no snuffbox tenderness).       Right hand: She exhibits tenderness (Generalized, dorsum). She exhibits normal range of motion, no bony tenderness, normal capillary refill, no deformity and no swelling. normal sensation noted.  Neurological: She is alert. No sensory deficit.       Motor, sensation, and vascular distal to the injury is fully  intact.   Skin: Skin is warm and dry.  Psychiatric: She has a normal mood and affect.    ED Course  Procedures (including critical care time)  Labs Reviewed - No data to display Dg Hand Complete Right  04/16/2012  *RADIOLOGY REPORT*  Clinical Data: Injury  RIGHT HAND - COMPLETE 3+ VIEW  Comparison: None.  Findings: Post-traumatic changes of the fifth metacarpal.  No acute fracture.  No dislocation.  Unremarkable soft tissues. Osteopenia.  IMPRESSION: No acute bony pathology.  Original Report Authenticated By: Donavan Burnet, M.D.     1. Hand injury     10:27 PM Patient seen and examined. X-ray reviewed.   Vital signs reviewed and are as follows: Filed Vitals:   04/16/12 2050  BP: 134/98  Pulse: 85  Temp: 98.7 F (37.1 C)  Resp: 18   10:28 PM Patient was counseled on RICE protocol and told to rest injury, use ice for no longer than 15 minutes every hour, compress the area, and elevate above the level of their heart as much as possible to reduce swelling.  Questions answered.  Patient verbalized understanding.    Patient counseled on use of narcotic pain medications. Counseled not to combine these medications with others containing tylenol. Urged not to drink alcohol, drive, or perform any other activities that requires focus while taking these medications. The patient verbalizes understanding and agrees with the plan.   MDM  Hand injury. Full ROM, no neuro deficits. No anatomic snuff box tenderness. Conservative management indicated.         [place], [place] 04/18/12 [phone]

## 2012-04-20 NOTE — ED Provider Notes (Signed)
Medical screening examination/treatment/procedure(s) were performed by non-physician practitioner and as supervising physician I was immediately available for consultation/collaboration.    Hollis Tuller R Stephanee Barcomb, MD 04/20/12 1109 

## 2012-07-30 ENCOUNTER — Emergency Department (HOSPITAL_COMMUNITY)
Admission: EM | Admit: 2012-07-30 | Discharge: 2012-07-30 | Disposition: A | Discharge status: Home / Self Care | Payer: Medicaid Other | Attending: Emergency Medicine | Admitting: Emergency Medicine

## 2012-07-30 ENCOUNTER — Emergency Department (HOSPITAL_COMMUNITY): Payer: Medicaid Other

## 2012-07-30 ENCOUNTER — Encounter (HOSPITAL_COMMUNITY): Payer: Self-pay | Admitting: *Deleted

## 2012-07-30 DIAGNOSIS — J209 Acute bronchitis, unspecified: Secondary | ICD-10-CM

## 2012-07-30 DIAGNOSIS — Z87891 Personal history of nicotine dependence: Secondary | ICD-10-CM

## 2012-07-30 DIAGNOSIS — F17200 Nicotine dependence, unspecified, uncomplicated: Secondary | ICD-10-CM | POA: Insufficient documentation

## 2012-07-30 DIAGNOSIS — Z794 Long term (current) use of insulin: Secondary | ICD-10-CM | POA: Insufficient documentation

## 2012-07-30 DIAGNOSIS — E119 Type 2 diabetes mellitus without complications: Secondary | ICD-10-CM | POA: Insufficient documentation

## 2012-07-30 DIAGNOSIS — Z79899 Other long term (current) drug therapy: Secondary | ICD-10-CM | POA: Insufficient documentation

## 2012-07-30 DIAGNOSIS — E876 Hypokalemia: Secondary | ICD-10-CM

## 2012-07-30 DIAGNOSIS — R0602 Shortness of breath: Secondary | ICD-10-CM | POA: Insufficient documentation

## 2012-07-30 DIAGNOSIS — R062 Wheezing: Secondary | ICD-10-CM | POA: Insufficient documentation

## 2012-07-30 DIAGNOSIS — J441 Chronic obstructive pulmonary disease with (acute) exacerbation: Secondary | ICD-10-CM | POA: Insufficient documentation

## 2012-07-30 LAB — CBC
Hemoglobin: 13.7 g/dL (ref 12.0–15.0)
MCH: 26.4 pg (ref 26.0–34.0)
Platelets: 124 10*3/uL — ABNORMAL LOW (ref 150–400)
RBC: 5.19 MIL/uL — ABNORMAL HIGH (ref 3.87–5.11)
WBC: 5.5 10*3/uL (ref 4.0–10.5)

## 2012-07-30 LAB — COMPREHENSIVE METABOLIC PANEL
ALT: 63 U/L — ABNORMAL HIGH (ref 0–35)
AST: 49 U/L — ABNORMAL HIGH (ref 0–37)
Alkaline Phosphatase: 134 U/L — ABNORMAL HIGH (ref 39–117)
CO2: 25 mEq/L (ref 19–32)
Calcium: 9.4 mg/dL (ref 8.4–10.5)
GFR calc non Af Amer: >90 mL/min (ref >90)
Potassium: 3.1 mEq/L — ABNORMAL LOW (ref 3.5–5.1)
Sodium: 142 mEq/L (ref 135–145)

## 2012-07-30 LAB — APTT: aPTT: 27 seconds (ref 24–37)

## 2012-07-30 MED ORDER — AZITHROMYCIN 250 MG PO TABS: 250.0000 mg | ORAL_TABLET | Freq: Every day | ORAL | Status: DC

## 2012-07-30 MED ORDER — ALBUTEROL SULFATE HFA 108 (90 BASE) MCG/ACT IN AERS: 2.0000 | INHALATION_SPRAY | RESPIRATORY_TRACT | Status: DC | PRN

## 2012-07-30 MED ORDER — IPRATROPIUM BROMIDE 0.02 % IN SOLN
1.0000 mg | Freq: Once | RESPIRATORY_TRACT | Status: AC
  Administered 2012-07-30: 1 mg via RESPIRATORY_TRACT
  Filled 2012-07-30: qty 5

## 2012-07-30 MED ORDER — POTASSIUM CHLORIDE CRYS ER 20 MEQ PO TBCR
40.0000 meq | EXTENDED_RELEASE_TABLET | Freq: Once | ORAL | Status: AC
  Administered 2012-07-30: 40 meq via ORAL
  Filled 2012-07-30: qty 2

## 2012-07-30 MED ORDER — ALBUTEROL SULFATE (5 MG/ML) 0.5% IN NEBU
5.0000 mg | INHALATION_SOLUTION | Freq: Once | RESPIRATORY_TRACT | Status: AC
  Administered 2012-07-30: 5 mg via RESPIRATORY_TRACT
  Filled 2012-07-30: qty 1

## 2012-07-30 MED ORDER — PREDNISONE 20 MG PO TABS: 40.0000 mg | ORAL_TABLET | Freq: Every day | ORAL | Status: DC

## 2012-07-30 NOTE — ED Notes (Signed)
Patient awaiting results.

## 2012-07-30 NOTE — ED Notes (Signed)
Family at bedside. 

## 2012-07-30 NOTE — ED Notes (Signed)
Respiratory at bedside.

## 2012-07-30 NOTE — ED Provider Notes (Signed)
History     CSN: [id number]  Arrival date & time 07/30/12  1158   First MD Initiated Contact with Patient 07/30/12 1307      Chief Complaint  Patient presents with  . Hemoptysis    (Consider location/radiation/quality/duration/timing/severity/associated sxs/prior treatment) HPI Comments: Kara Kelley presents ambulatory for evaluation of a cough from home. She reports that she has had a runny nose, but denies fever, sore throat, chest pain, palpitations, or known recent sick contacts. She states she's had a cough for several days, but within the last 24 hours it is increased in its persistence and intensity. Yesterday she coughed and noted bloody sputum. She reports it was a small amount. It occurred 2 more times this morning prompting her to seek further medical attention. She has a distant history of tuberculosis and was treated at a [place] medical facility in the mid 1980s. She reports she completed a course of antibiotics that lasted a full year. During that time. She reports she also had a chronic cough, but also experienced night sweats, weight loss, and intermittent fever. She denies any of these complaints currently.  Patient is a 56 y.o. female presenting with cough. The history is provided by the patient. No language interpreter was used.  Cough This is a recurrent problem. The current episode started more than 2 days ago (About 5 days). The problem has not changed since onset.The cough is productive of bloody sputum (Became concerned when the cough was adductive of bloody sputum yesterday. Had 2 other episodes today.). There has been no fever. Associated symptoms include rhinorrhea, shortness of breath and wheezing. Pertinent negatives include no chest pain, no chills, no sweats, no weight loss, no ear congestion, no ear pain, no headaches, no sore throat, no myalgias and no eye redness. She has tried nothing for the symptoms. Her past medical history is significant for asthma. Past  medical history comments: Patient is also a smoker and has a distant history of tuberculosis. She received treatment in [place] in the mid 1980s..    Past Medical History  Diagnosis Date  . Hepatitis C   . Diabetes mellitus   . Migraine   . Degenerative joint disease     Past Surgical History  Procedure Date  . Cesarean section   . Foot surgery     History reviewed. No pertinent family history.  History  Substance Use Topics  . Smoking status: Current Every Day Smoker -- 0.5 packs/day    Types: Cigarettes  . Smokeless tobacco: Not on file  . Alcohol Use: No    OB History    Grav Para Term Preterm Abortions TAB SAB Ect Mult Living   4 3   1 1    3       Review of Systems  Constitutional: Negative for chills and weight loss.  HENT: Positive for congestion and rhinorrhea. Negative for ear pain, sore throat and trouble swallowing.   Eyes: Negative for redness.  Respiratory: Positive for cough, shortness of breath and wheezing.   Cardiovascular: Negative for chest pain.  Musculoskeletal: Negative for myalgias.  Neurological: Negative for headaches.  All other systems reviewed and are negative.    Allergies  Review of patient's allergies indicates no known allergies.  Home Medications   Current Outpatient Rx  Name Route Sig Dispense Refill  . ALBUTEROL SULFATE HFA 108 (90 BASE) MCG/ACT IN AERS Inhalation Inhale 2 puffs into the lungs every 6 (six) hours as needed. For shortness of breath.    [name]  ARIPIPRAZOLE 2 MG PO TABS Oral Take 2 mg by mouth daily.      . INSULIN ASPART 100 UNIT/ML [place] SOLN Subcutaneous Inject 16 Units into the skin 3 (three) times daily before meals.    . INSULIN GLARGINE 100 UNIT/ML [place] SOLN Subcutaneous Inject 30 Units into the skin at bedtime.    . ADULT MULTIVITAMIN W/MINERALS CH Oral Take 1 tablet by mouth daily.    [name] OMEPRAZOLE 20 MG PO CPDR Oral Take 20 mg by mouth daily.    [name] NAPROXEN SODIUM 220 MG PO TABS Oral Take 440 mg by mouth 2  (two) times daily as needed. For pain.      BP 156/99  Pulse 80  Temp 98 F (36.7 C) (Oral)  Resp 20  SpO2 98%  Physical Exam  Vitals reviewed. Constitutional: She is oriented to person, place, and time. She appears well-developed and well-nourished. No distress.  HENT:  Head: Normocephalic and atraumatic.  Right Ear: External ear normal.  Left Ear: External ear normal.  Nose: Nose normal.  Mouth/Throat: Oropharynx is clear and moist. No oropharyngeal exudate.  Eyes: Conjunctivae normal and EOM are normal. Pupils are equal, round, and reactive to light. Right eye exhibits no discharge. Left eye exhibits no discharge. No scleral icterus.  Neck: Normal range of motion. Neck supple. No JVD present. No tracheal deviation present.  Cardiovascular: Normal rate, regular rhythm, normal heart sounds and intact distal pulses.  Exam reveals no gallop and no friction rub.   No murmur heard. Pulmonary/Chest: Effort normal. No stridor. No respiratory distress. She has wheezes. She has no rales. She exhibits no tenderness.       No dullness to percussion  Abdominal: Soft. Bowel sounds are normal. She exhibits no distension and no mass. There is no tenderness. There is no rebound and no guarding.  Musculoskeletal: Normal range of motion. She exhibits no edema and no tenderness.  Lymphadenopathy:    She has no cervical adenopathy.  Neurological: She is alert and oriented to person, place, and time.  Skin: Skin is warm and dry. No rash noted. She is not diaphoretic. No erythema. No pallor.  Psychiatric: She has a normal mood and affect. Her behavior is normal.    ED Course  Procedures (including critical care time)  Labs Reviewed - No data to display Dg Chest 2 View  07/30/2012  *RADIOLOGY REPORT*  Clinical Data: Cough and hemoptysis.  CHEST - 2 VIEW  Comparison: 12/15/2010  Findings: The lungs are clear without evidence of edema, infiltrate, mass or pleural fluid.  There is a small  calcification in the peripheral left lung which is stable and likely represents a calcified granuloma.  There are stable minimal degenerative changes of the thoracic spine.  IMPRESSION: No active disease.   Original Report Authenticated By: Reola Calkins, M.D.      No diagnosis found.    MDM  Pt presents for evaluation of coughing and hemoptysis.  She has a distant hx of tuberculosis but states she completed treatment in the mid 80's.  She denies night sweats, fever, or weight loss.  Pt appears comfortable, NAD.  Note stable VS (sl BP elevation).  Plan CXR and symptomatic care.  Will also obtain basic labs and coags.  1650.  CXR demonstrates a calcified granuloma but no acute infiltrate or evidence of reactivated TB.  She reports cough is improved s/p albuterol.  Note no evidence of CHF and no coagulopathy.  She is a smoker and was wheezing  on her initial evaluation.  This has resolved.  Will treat for acute/chronic bronchitis.  Will refer to a local pulmonologist.  Potassium was administered prior to discharge home.      Tobin Chad, MD 07/30/12 [phone]

## 2012-07-30 NOTE — ED Notes (Signed)
Pt states "I started coughing up blood on yesterday and more today, I had TB in 1985."

## 2012-07-30 NOTE — ED Notes (Signed)
Mask placed on pt in triage

## 2012-08-02 ENCOUNTER — Emergency Department (HOSPITAL_COMMUNITY)
Admission: EM | Admit: 2012-08-02 | Discharge: 2012-08-02 | Disposition: A | Discharge status: Home / Self Care | Payer: Medicaid Other | Attending: Emergency Medicine | Admitting: Emergency Medicine

## 2012-08-02 ENCOUNTER — Encounter (HOSPITAL_COMMUNITY): Payer: Self-pay | Admitting: Emergency Medicine

## 2012-08-02 DIAGNOSIS — W261XXA Contact with sword or dagger, initial encounter: Secondary | ICD-10-CM | POA: Insufficient documentation

## 2012-08-02 DIAGNOSIS — G8929 Other chronic pain: Secondary | ICD-10-CM | POA: Insufficient documentation

## 2012-08-02 DIAGNOSIS — Z8619 Personal history of other infectious and parasitic diseases: Secondary | ICD-10-CM | POA: Insufficient documentation

## 2012-08-02 DIAGNOSIS — F17200 Nicotine dependence, unspecified, uncomplicated: Secondary | ICD-10-CM | POA: Insufficient documentation

## 2012-08-02 DIAGNOSIS — M1990 Unspecified osteoarthritis, unspecified site: Secondary | ICD-10-CM | POA: Insufficient documentation

## 2012-08-02 DIAGNOSIS — S61012A Laceration without foreign body of left thumb without damage to nail, initial encounter: Secondary | ICD-10-CM

## 2012-08-02 DIAGNOSIS — S61209A Unspecified open wound of unspecified finger without damage to nail, initial encounter: Secondary | ICD-10-CM | POA: Insufficient documentation

## 2012-08-02 DIAGNOSIS — W260XXA Contact with knife, initial encounter: Secondary | ICD-10-CM | POA: Insufficient documentation

## 2012-08-02 DIAGNOSIS — Y9389 Activity, other specified: Secondary | ICD-10-CM | POA: Insufficient documentation

## 2012-08-02 DIAGNOSIS — K219 Gastro-esophageal reflux disease without esophagitis: Secondary | ICD-10-CM | POA: Insufficient documentation

## 2012-08-02 DIAGNOSIS — Y929 Unspecified place or not applicable: Secondary | ICD-10-CM | POA: Insufficient documentation

## 2012-08-02 DIAGNOSIS — E119 Type 2 diabetes mellitus without complications: Secondary | ICD-10-CM | POA: Insufficient documentation

## 2012-08-02 DIAGNOSIS — G43909 Migraine, unspecified, not intractable, without status migrainosus: Secondary | ICD-10-CM | POA: Insufficient documentation

## 2012-08-02 HISTORY — DX: Other chronic pain: G89.29

## 2012-08-02 HISTORY — DX: Gastro-esophageal reflux disease without esophagitis: K21.9

## 2012-08-02 MED ORDER — TETANUS-DIPHTHERIA TOXOIDS TD 5-2 LFU IM INJ: 0.5000 mL | INJECTION | Freq: Once | INTRAMUSCULAR | Status: DC

## 2012-08-02 MED ORDER — TETANUS-DIPHTH-ACELL PERTUSSIS 5-2.5-18.5 LF-MCG/0.5 IM SUSP
INTRAMUSCULAR | Status: AC
  Administered 2012-08-02: 0.5 mL via INTRAMUSCULAR
  Filled 2012-08-02: qty 0.5

## 2012-08-02 MED ORDER — TETANUS-DIPHTH-ACELL PERTUSSIS 5-2.5-18.5 LF-MCG/0.5 IM SUSP
0.5000 mL | Freq: Once | INTRAMUSCULAR | Status: AC
  Administered 2012-08-02: 0.5 mL via INTRAMUSCULAR

## 2012-08-02 NOTE — ED Provider Notes (Signed)
History     CSN: [id number]  Arrival date & time 08/02/12  1929   First MD Initiated Contact with Patient 08/02/12 1930      Chief Complaint  Patient presents with  . Extremity Laceration    (Consider location/radiation/quality/duration/timing/severity/associated sxs/prior treatment) HPI Comments: Kara Kelley is a 56 y.o. Female who presents with complaint of a laceration to the left thumb. Pt was cutting chiecke when a knife slipped and cut her on the dorsal surface of left proximal thumb. Bleeding controlled. Pt states she has good sensation to the distal finger, full rom of all joints. Tetanus unknown. Pain worsened with palpation over the cut, better with holding it still. Pressure prior to the arrival.    Past Medical History  Diagnosis Date  . Hepatitis C   . Diabetes mellitus   . Migraine   . Degenerative joint disease   . Chronic pain   . GERD (gastroesophageal reflux disease)     Past Surgical History  Procedure Date  . Cesarean section   . Foot surgery     No family history on file.  History  Substance Use Topics  . Smoking status: Current Every Day Smoker -- 0.5 packs/day    Types: Cigarettes  . Smokeless tobacco: Not on file  . Alcohol Use: No    OB History    Grav Para Term Preterm Abortions TAB SAB Ect Mult Living   4 3   1 1    3       Review of Systems  Constitutional: Negative for fever and chills.  HENT: Negative for neck pain.   Respiratory: Negative.   Cardiovascular: Negative.   Skin: Positive for wound.  Neurological: Negative for weakness and numbness.  Hematological: Does not bruise/bleed easily.    Allergies  Review of patient's allergies indicates no known allergies.  Home Medications   Current Outpatient Rx  Name Route Sig Dispense Refill  . ALBUTEROL SULFATE HFA 108 (90 BASE) MCG/ACT IN AERS Inhalation Inhale 2 puffs into the lungs every 6 (six) hours as needed. For shortness of breath.    . ARIPIPRAZOLE 2 MG PO TABS  Oral Take 2 mg by mouth daily.      . AZITHROMYCIN 250 MG PO TABS Oral Take 250 mg by mouth daily. 500mg  PO day 1, then 250mg  PO daily until gone    . INSULIN ASPART 100 UNIT/ML [place] SOLN Subcutaneous Inject 16 Units into the skin 3 (three) times daily before meals.    . INSULIN GLARGINE 100 UNIT/ML [place] SOLN Subcutaneous Inject 30 Units into the skin at bedtime.    [name] NAPROXEN SODIUM 220 MG PO TABS Oral Take 440 mg by mouth 2 (two) times daily as needed. For pain.    [name] OMEPRAZOLE 20 MG PO CPDR Oral Take 20 mg by mouth daily.    [name] PREDNISONE 20 MG PO TABS Oral Take 40 mg by mouth daily.      BP 154/105  Pulse 87  Temp 99 F (37.2 C)  Resp 18  SpO2 97%  Physical Exam  Nursing note and vitals reviewed. Constitutional: She is oriented to person, place, and time. She appears well-developed and well-nourished. No distress.  Cardiovascular: Normal rate, regular rhythm and normal heart sounds.   Pulmonary/Chest: Effort normal and breath sounds normal. No respiratory distress. She has no wheezes. She has no rales.  Musculoskeletal:       1cm superficial laceration though the dorsal aspect of the left proximal phalanx of the  thumb. Hemostatic. Full ROM of the left thumb with 5/5 strength with flexion and extension. <2sec cap refill. Good sensation to the tip of the finger  Neurological: She is alert and oriented to person, place, and time.  Skin: Skin is warm and dry.    ED Course  Procedures (including critical care time)  Pt with very superficial laceration. Tendons and nerve intact with full rom and normal sensation to the distal finger. Pt did not want to get stitches. I irrigated wound thoroughly. Dermabond used for repair with a finger splint to prevent bending for several days. D/c home with follow up as needed.   1. Laceration of left thumb       MDM          Lottie Mussel, PA 08/03/12 [phone]

## 2012-08-02 NOTE — ED Notes (Addendum)
Pt c/o left hand thumb laceration that measures 2 cm in length. Pt reports 9/10 pain. Cap refill <3 seconds. Bleeding controlled and NAD.

## 2012-08-07 NOTE — ED Provider Notes (Signed)
Medical screening examination/treatment/procedure(s) were performed by non-physician practitioner and as supervising physician I was immediately available for consultation/collaboration.  Donnetta Hutching, MD 08/07/12 [phone]

## 2012-08-10 ENCOUNTER — Institutional Professional Consult (permissible substitution): Payer: Medicaid Other | Admitting: Internal Medicine

## 2012-08-17 ENCOUNTER — Institutional Professional Consult (permissible substitution): Payer: Medicaid Other | Admitting: Internal Medicine

## 2012-12-25 ENCOUNTER — Ambulatory Visit: Payer: Self-pay | Admitting: Podiatry

## 2012-12-26 ENCOUNTER — Ambulatory Visit: Payer: Self-pay | Admitting: Podiatry

## 2012-12-29 ENCOUNTER — Emergency Department (HOSPITAL_COMMUNITY)
Admission: EM | Admit: 2012-12-29 | Discharge: 2012-12-29 | Disposition: A | Discharge status: Home Health | Payer: Medicaid Other | Attending: Emergency Medicine | Admitting: Emergency Medicine

## 2012-12-29 ENCOUNTER — Encounter (HOSPITAL_COMMUNITY): Payer: Self-pay | Admitting: Nurse Practitioner

## 2012-12-29 DIAGNOSIS — L0291 Cutaneous abscess, unspecified: Secondary | ICD-10-CM

## 2012-12-29 DIAGNOSIS — Z79899 Other long term (current) drug therapy: Secondary | ICD-10-CM | POA: Insufficient documentation

## 2012-12-29 DIAGNOSIS — Z8679 Personal history of other diseases of the circulatory system: Secondary | ICD-10-CM | POA: Insufficient documentation

## 2012-12-29 DIAGNOSIS — K219 Gastro-esophageal reflux disease without esophagitis: Secondary | ICD-10-CM | POA: Insufficient documentation

## 2012-12-29 DIAGNOSIS — F17200 Nicotine dependence, unspecified, uncomplicated: Secondary | ICD-10-CM | POA: Insufficient documentation

## 2012-12-29 DIAGNOSIS — Z8619 Personal history of other infectious and parasitic diseases: Secondary | ICD-10-CM | POA: Insufficient documentation

## 2012-12-29 DIAGNOSIS — R739 Hyperglycemia, unspecified: Secondary | ICD-10-CM

## 2012-12-29 DIAGNOSIS — E1169 Type 2 diabetes mellitus with other specified complication: Secondary | ICD-10-CM | POA: Insufficient documentation

## 2012-12-29 DIAGNOSIS — Z794 Long term (current) use of insulin: Secondary | ICD-10-CM | POA: Insufficient documentation

## 2012-12-29 DIAGNOSIS — L02419 Cutaneous abscess of limb, unspecified: Secondary | ICD-10-CM | POA: Insufficient documentation

## 2012-12-29 DIAGNOSIS — Z8739 Personal history of other diseases of the musculoskeletal system and connective tissue: Secondary | ICD-10-CM | POA: Insufficient documentation

## 2012-12-29 DIAGNOSIS — G8929 Other chronic pain: Secondary | ICD-10-CM | POA: Insufficient documentation

## 2012-12-29 LAB — GLUCOSE, CAPILLARY
Glucose-Capillary: 454 mg/dL — ABNORMAL HIGH (ref 70–99)
Glucose-Capillary: 290 mg/dL — ABNORMAL HIGH (ref 70–99)

## 2012-12-29 MED ORDER — INSULIN ASPART 100 UNIT/ML ~~LOC~~ SOLN
16.0000 [IU] | Freq: Once | SUBCUTANEOUS | Status: AC
  Administered 2012-12-29: 16 [IU] via SUBCUTANEOUS
  Filled 2012-12-29: qty 1

## 2012-12-29 MED ORDER — LIDOCAINE-EPINEPHRINE 1 %-1:100000 IJ SOLN
20.0000 mL | Freq: Once | INTRAMUSCULAR | Status: AC
  Administered 2012-12-29: 1 mL via INTRADERMAL
  Filled 2012-12-29: qty 1

## 2012-12-29 MED ORDER — OXYCODONE-ACETAMINOPHEN 5-325 MG PO TABS
1.0000 | ORAL_TABLET | Freq: Once | ORAL | Status: AC
  Administered 2012-12-29: 1 via ORAL
  Filled 2012-12-29: qty 1

## 2012-12-29 MED ORDER — DIAZEPAM 5 MG PO TABS
5.0000 mg | ORAL_TABLET | Freq: Once | ORAL | Status: AC
  Administered 2012-12-29: 5 mg via ORAL
  Filled 2012-12-29: qty 1

## 2012-12-29 NOTE — ED Notes (Signed)
Dressing applied. 

## 2012-12-29 NOTE — ED Notes (Signed)
FSBS at 290

## 2012-12-29 NOTE — ED Notes (Signed)
cbg 454. Did not take her insulin today

## 2012-12-29 NOTE — ED Provider Notes (Signed)
History     CSN: [id number]  Arrival date & time 12/29/12  4098   First MD Initiated Contact with Patient 12/29/12 1934      Chief Complaint  Patient presents with  . Wound Infection    (Consider location/radiation/quality/duration/timing/severity/associated sxs/prior treatment) Patient is a 57 y.o. female presenting with abscess.  Abscess Location:  Leg Leg abscess location:  R hip Abscess quality: fluctuance, induration, painful and redness   Abscess quality: not draining and not weeping   Red streaking: no   Duration:  2 days Progression:  Worsening Pain details:    Quality:  Throbbing   Severity:  Severe   Timing:  Constant   Progression:  Worsening Context: diabetes   Relieved by:  Nothing Worsened by:  Nothing tried Associated symptoms: no fever, no nausea and no vomiting   Risk factors: prior abscess     Past Medical History  Diagnosis Date  . Hepatitis C   . Diabetes mellitus   . Migraine   . Degenerative joint disease   . Chronic pain   . GERD (gastroesophageal reflux disease)     Past Surgical History  Procedure Laterality Date  . Cesarean section    . Foot surgery      History reviewed. No pertinent family history.  History  Substance Use Topics  . Smoking status: Current Every Day Smoker -- 0.50 packs/day    Types: Cigarettes  . Smokeless tobacco: Not on file  . Alcohol Use: No    OB History   Grav Para Term Preterm Abortions TAB SAB Ect Mult Living   4 3   1 1    3       Review of Systems  Constitutional: Negative for fever.  HENT: Negative for congestion.   Respiratory: Negative for cough and shortness of breath.   Cardiovascular: Negative for chest pain.  Gastrointestinal: Negative for nausea, vomiting, abdominal pain and diarrhea.  Genitourinary: Negative for difficulty urinating.  All other systems reviewed and are negative.    Allergies  Review of patient's allergies indicates no known allergies.  Home Medications    Current Outpatient Rx  Name  Route  Sig  Dispense  Refill  . albuterol (PROVENTIL HFA;VENTOLIN HFA) 108 (90 BASE) MCG/ACT inhaler   Inhalation   Inhale 2 puffs into the lungs every 6 (six) hours as needed. For shortness of breath.         . ARIPiprazole (ABILIFY) 2 MG tablet   Oral   Take 2 mg by mouth daily.           . insulin aspart (NOVOLOG) 100 UNIT/ML injection   Subcutaneous   Inject 16 Units into the skin 3 (three) times daily before meals.         . insulin glargine (LANTUS) 100 UNIT/ML injection   Subcutaneous   Inject 30 Units into the skin at bedtime.         [name] omeprazole (PRILOSEC) 20 MG capsule   Oral   Take 20 mg by mouth daily.           BP 138/82  Pulse 88  Temp(Src) 98.8 F (37.1 C) (Oral)  Resp 18  SpO2 100%  Physical Exam  Nursing note and vitals reviewed. Constitutional: She is oriented to person, place, and time. She appears well-developed and well-nourished. No distress.  obese  HENT:  Head: Normocephalic and atraumatic.  Mouth/Throat: Oropharynx is clear and moist.  Eyes: Conjunctivae are normal. Pupils are equal, round, and reactive to  light. No scleral icterus.  Neck: Neck supple.  Cardiovascular: Normal rate, regular rhythm, normal heart sounds and intact distal pulses.   No murmur heard. Pulmonary/Chest: Effort normal. No stridor. No respiratory distress. She has wheezes. She has no rales.  Abdominal: Soft. Bowel sounds are normal. She exhibits no distension. There is no tenderness.  Musculoskeletal: Normal range of motion.  Neurological: She is alert and oriented to person, place, and time.  Skin: Skin is warm and dry. No rash noted.     Psychiatric: She has a normal mood and affect. Her behavior is normal.    ED Course  INCISION AND DRAINAGE Date/Time: 12/30/2012 1:02 AM Performed by: Rennis Petty Authorized by: Lear Ng. Consent: Verbal consent obtained. written consent obtained. Type: abscess Body  area: lower extremity Location details: right hip Local anesthetic: lidocaine 1% with epinephrine Scalpel size: 11 Incision type: single straight Complexity: simple Drainage: purulent Drainage amount: copious Wound treatment: wound left open Packing material: 1/4 in gauze Patient tolerance: Patient tolerated the procedure well with no immediate complications.   (including critical care time)  Labs Reviewed  GLUCOSE, CAPILLARY - Abnormal; Notable for the following:    Glucose-Capillary 454 (*)    All other components within normal limits  GLUCOSE, CAPILLARY - Abnormal; Notable for the following:    Glucose-Capillary 290 (*)    All other components within normal limits   No results found.   1. Abscess   2. Hyperglycemia       MDM   57 yo female with abscess to right thigh.  No systemic symptoms.  Also hyperglycemic.  "I didn't take either of my insulins today".  No signs/symptoms of DKA.  Will give home dose novolog and drain abscess.    Abscess drained successfully.      Rennis Petty, MD 12/30/12 [phone]

## 2012-12-29 NOTE — ED Notes (Signed)
MD at bedside. 

## 2012-12-29 NOTE — ED Provider Notes (Signed)
I saw and evaluated the patient, reviewed the resident's note and I agree with the findings and plan.  Pt with gluteal abscess on right side.  Tender to touch, likely need drainage.  I was available for I&D procedure by Dr. Loretha Stapler.  Please see his procedure note.    Pt's glucose is elevated likely due to combination of chronic history, pain and abscess infection.    Impression: Gluteal abscess  Gavin Pound. Marton Malizia, MD 12/29/12 2251

## 2012-12-29 NOTE — ED Notes (Addendum)
Pt with wound to R buttock 2 days ago, reports pain at site. Concerned because she is Diabetic.

## 2012-12-29 NOTE — ED Notes (Signed)
Doctor at bedside completing I and D

## 2012-12-29 NOTE — ED Notes (Signed)
Pt awaiting Procedure. Informed of deleays

## 2013-01-23 ENCOUNTER — Ambulatory Visit (INDEPENDENT_AMBULATORY_CARE_PROVIDER_SITE_OTHER): Payer: Medicaid Other | Admitting: Podiatry

## 2013-01-23 ENCOUNTER — Encounter: Payer: Self-pay | Admitting: Podiatry

## 2013-01-23 VITALS — BP 124/85 | HR 76 | Ht 60.0 in | Wt 208.0 lb

## 2013-01-23 DIAGNOSIS — M24576 Contracture, unspecified foot: Secondary | ICD-10-CM

## 2013-01-23 DIAGNOSIS — B351 Tinea unguium: Secondary | ICD-10-CM

## 2013-01-23 DIAGNOSIS — M7741 Metatarsalgia, right foot: Secondary | ICD-10-CM

## 2013-01-23 DIAGNOSIS — M7750 Other enthesopathy of unspecified foot: Secondary | ICD-10-CM

## 2013-01-23 DIAGNOSIS — M24573 Contracture, unspecified ankle: Secondary | ICD-10-CM

## 2013-01-23 DIAGNOSIS — M25579 Pain in unspecified ankle and joints of unspecified foot: Secondary | ICD-10-CM

## 2013-01-23 DIAGNOSIS — Q828 Other specified congenital malformations of skin: Secondary | ICD-10-CM

## 2013-01-23 DIAGNOSIS — L84 Corns and callosities: Secondary | ICD-10-CM | POA: Insufficient documentation

## 2013-01-23 DIAGNOSIS — M21969 Unspecified acquired deformity of unspecified lower leg: Secondary | ICD-10-CM | POA: Insufficient documentation

## 2013-01-23 DIAGNOSIS — M79609 Pain in unspecified limb: Secondary | ICD-10-CM

## 2013-01-23 DIAGNOSIS — L600 Ingrowing nail: Secondary | ICD-10-CM

## 2013-01-23 NOTE — Progress Notes (Signed)
SUBJECTIVE: 57 y.o. year old female presents complaining of foot pain R>L. Patient points having pain at the right great toe nail area, and deep callused skin under the ball of both feet.  Patient has had this problem for several years. This time patient is seeking a available treatment options.  Patient is a type II IDDM, and stated that her blood sugar is under control.   Reviewed and noted Medications, Allergies, Medical and Surgical histories.  REVIEW OF SYSTEMS: Constitutional: negative for chills, fatigue, fevers, malaise, night sweats and weight loss Eyes: Only wears corrective glasses. Ears, nose, mouth, throat, and face: negative Respiratory: negative Cardiovascular: negative Gastrointestinal: Stays constipated. Genitourinary:negative Integument/breast: negative Musculoskeletal:Arthritis and aching hands and toes. Neurological: negative Allergic/Immunologic: negative  OBJECTIVE: DERMATOLOGIC EXAMINATION: Nails: Thick hypertrophic nails x 9 (S/P Left great toe had done Sime procedure). Severely deformed nail on right hallux following previous total matrixectomy (Nail grew back deformed and painful). Skin: Severe Intractable Porokeratosis sub 5 bilateral, sub 2 right.  VASCULAR EXAMINATION OF LOWER LIMBS: Pedal pulses: All pedal pulses are palpable with normal pulsation.  Capillary Filling times within 3 seconds in all digits.  Edema: negative noted in the lower limbs bilateral.  Ischemic changes negative on bilateral. Temperature gradient from tibial crest to dorsum of foot is within normal bilateral.  NEUROLOGIC EXAMINATION OF THE LOWER LIMBS: Achilles DTR is present and within normal. Monofilament (Semmes-Weinstein 10-gm) sensory testing positive 6 out of 6, bilateral. Vibratory sensations(128Hz  turning fork) intact at medial and lateral forefoot bilateral.  Sharp and Dull discriminatory sensations at the plantar ball of hallux is intact bilateral.   MUSCULOSKELETAL  EXAMINATION: Rectus foot without gross deformity. Post surgical toe left from distal phalangeal resection in conjunction with total matrixectomy.  RADIOGRAPHIC FINDINGS: AP View Left: Post surgical foot with reduced medial eminence of first Metatarsal head, partially removed distal 1/2 of the distal phalanx, partially removed distal 1/3 of proximal phalanx of 5th digit, and presence of internal fixation screw at 5th Metatarsal head. Positive for laterally deviated 1st, 2nd, and 3rd digits. AP View Right: Rectus foot with mild degree of the first MCJ, presence of internal fixation screws at the first and 2nd Metatarsal head.  Lateral View Left: No significant abnormal findings other than internal fixation screw with partially removed bone from the 1st and 5th digit. Lateral View Right: Presence of bone spur projecting dorsally from the dorsal surface of the right distal phalanx beneath the nail plate.  Also seen fixation screws on 1st and 2nd Met.   ASSESSMENT: 1. Pain in foot R>L.  2. Intractable Porokeratosis 5th MPJ area bilateral, and sub 2nd MPJ right. 3. Deformed nail right hallux. 4. Pressure from 5 th Metatarsal head aggravating the plantar lesions.   PLAN: Reviewed all available treatment options. Debrided all deformed nails and calluses bilateral. Surgical options explained.  Patient requested surgical correction of the right foot problem. Consent form reviewed for 1. Phalangeal ostectomy with nail resection right hallux., 2. Excision plantar lesion right under 5th MPJ area, 3. Possible Metatarsal osteotomy 5th to relieve pressure is indicated.  Patient seemed to understand proposed procedure. All questions answered.

## 2013-01-25 ENCOUNTER — Encounter: Payer: Self-pay | Admitting: Podiatry

## 2013-01-30 DIAGNOSIS — M898X9 Other specified disorders of bone, unspecified site: Secondary | ICD-10-CM

## 2013-01-30 DIAGNOSIS — M21619 Bunion of unspecified foot: Secondary | ICD-10-CM

## 2013-01-30 HISTORY — PX: OSTEOTOMY: SHX137

## 2013-01-30 HISTORY — PX: OTHER SURGICAL HISTORY: SHX169

## 2013-02-04 ENCOUNTER — Encounter: Payer: Self-pay | Admitting: Podiatry

## 2013-02-06 ENCOUNTER — Encounter: Payer: Self-pay | Admitting: Podiatry

## 2013-02-06 ENCOUNTER — Ambulatory Visit (INDEPENDENT_AMBULATORY_CARE_PROVIDER_SITE_OTHER): Payer: Medicaid Other | Admitting: Podiatry

## 2013-02-06 VITALS — BP 133/85 | HR 82 | Temp 97.6°F | Ht 60.0 in | Wt 208.0 lb

## 2013-02-06 DIAGNOSIS — Z9889 Other specified postprocedural states: Secondary | ICD-10-CM | POA: Insufficient documentation

## 2013-02-06 HISTORY — DX: Other specified postprocedural states: Z98.890

## 2013-02-06 NOTE — Progress Notes (Signed)
5 days post op.  Patient walked in surgical shoe on right and with a cane.  Patient had 1). total right hallucal nail excision with resection of distal 1/2 of distal phalanx and skin plasty, 2). Excision plantar lesion under 5th MPJ area right foot. Right hallux is tightly wrapped with dry blood soaked gauze. Wound is clean and dry without edema, without erythema or drainage. Skin is well coapted. 5th MPJ plantar wound is clean and dry with small amount of blood in inner dressing. A: Normal wound healing without complication.  P: Both surgical wound cleansed with Betadine solution and Amerigel antibiotic ointment applied and re-dressed with sterile gauze and followed by Ace bandage.

## 2013-02-13 ENCOUNTER — Ambulatory Visit: Payer: Medicaid Other | Admitting: Podiatry

## 2013-02-18 ENCOUNTER — Other Ambulatory Visit: Payer: Self-pay | Admitting: Podiatry

## 2013-02-18 ENCOUNTER — Encounter: Payer: Self-pay | Admitting: Podiatry

## 2013-02-18 ENCOUNTER — Ambulatory Visit (INDEPENDENT_AMBULATORY_CARE_PROVIDER_SITE_OTHER): Payer: Medicaid Other | Admitting: Podiatry

## 2013-02-18 ENCOUNTER — Encounter (INDEPENDENT_AMBULATORY_CARE_PROVIDER_SITE_OTHER): Payer: Self-pay | Admitting: General Surgery

## 2013-02-18 ENCOUNTER — Ambulatory Visit (INDEPENDENT_AMBULATORY_CARE_PROVIDER_SITE_OTHER): Payer: Medicaid Other | Admitting: General Surgery

## 2013-02-18 VITALS — BP 109/78 | HR 85

## 2013-02-18 VITALS — BP 122/84 | HR 80 | Temp 97.6°F | Resp 16 | Ht 60.0 in | Wt 206.2 lb

## 2013-02-18 DIAGNOSIS — M25571 Pain in right ankle and joints of right foot: Secondary | ICD-10-CM

## 2013-02-18 DIAGNOSIS — L0231 Cutaneous abscess of buttock: Secondary | ICD-10-CM

## 2013-02-18 DIAGNOSIS — Z9889 Other specified postprocedural states: Secondary | ICD-10-CM

## 2013-02-18 DIAGNOSIS — M25579 Pain in unspecified ankle and joints of unspecified foot: Secondary | ICD-10-CM

## 2013-02-18 NOTE — Progress Notes (Signed)
Patient ID: Kara Kelley, female   DOB: 04/27/1956, 57 y.o.   MRN: [id number] The patient is a 57 year old female who approximately 2-3 weeks ago underwent a incision and drainage it was a long hospital for a right gluteal abscess. The patient states that after completing antibiotics and healing of the incision site, followed by spontaneous drainage. The patient has had no fevers while at home.   On exam: The patient does have approximately 3-4 cm area of induration approximately 1-2 cm superior lateral to the incision site.  I aspirated this with an 18-gauge needle there was no purulence it to be aspirated.  58 year old female with a healing right gluteal abscess. I believe the area is a healing incision and drainage site with granulation tissue formation I discussed with the patient should there bread, spontaneously began draining purulence, or start Having fevers, she should represent for possible I&D. I do not think she need antibiotics at this time.

## 2013-02-18 NOTE — Telephone Encounter (Signed)
Called into [facility] today 02/18/2013 Hydrocodone -Acetaminoph  7.5/325 mg  #60

## 2013-02-18 NOTE — Telephone Encounter (Signed)
Please call in Vicodin 7.5/325 #45 to take q 6-8hr prn pain.

## 2013-02-18 NOTE — Progress Notes (Signed)
Subjective: Patient came in surgical shoe on right and aid of a cane.  3 weeks post op, excision of nail and distal phalanx right hallux, excision of plantar lesion under 5th MPJ right. Stated that the great toe has been hurting for the last couple of days.  Objective:  Both surgical site is dry and well coapted. No redness, no edema, and no erythena noted from the painful right great toe. Assessment:  Normal post op wound without any signs of infection.  Found no apparent reason for the excess pain on the great toe. Plan:  Sutures removed. Selofix applied, followed by Ace wrap.  Patient is to keep the Selofix tape and can get wet. Must keep the tape dry.  Return in one week to monitor the pain.

## 2013-02-26 ENCOUNTER — Ambulatory Visit (INDEPENDENT_AMBULATORY_CARE_PROVIDER_SITE_OTHER): Payer: Medicaid Other | Admitting: Podiatry

## 2013-02-26 VITALS — BP 124/84 | HR 79

## 2013-02-26 DIAGNOSIS — Z9889 Other specified postprocedural states: Secondary | ICD-10-CM

## 2013-02-26 NOTE — Progress Notes (Signed)
4 weeks post op visit. Patient stated that the right foot is feeling better. All incisions well coapted and healed well. No edema or erythema noted. A: Satisfactory post op wound healing. P: Advised to continue to use Porous tapes, which were dispensed to re-enforce incision area for next 2 more weeks. Return as needed.

## 2013-02-26 NOTE — Patient Instructions (Addendum)
Post op wound. Healing well without complication. Continue to use porous tape over the incision next 3 weeks as instructed. Return in 3 weeks.

## 2013-03-19 ENCOUNTER — Ambulatory Visit (INDEPENDENT_AMBULATORY_CARE_PROVIDER_SITE_OTHER): Payer: Medicaid Other | Admitting: Podiatry

## 2013-03-19 ENCOUNTER — Encounter: Payer: Self-pay | Admitting: Podiatry

## 2013-03-19 VITALS — BP 112/71 | HR 81

## 2013-03-19 DIAGNOSIS — M25579 Pain in unspecified ankle and joints of unspecified foot: Secondary | ICD-10-CM

## 2013-03-19 DIAGNOSIS — M25571 Pain in right ankle and joints of right foot: Secondary | ICD-10-CM

## 2013-03-19 MED ORDER — HYDROCODONE-ACETAMINOPHEN 7.5-325 MG PO TABS: 1.0000 | ORAL_TABLET | Freq: Four times a day (QID) | ORAL | Status: DC | PRN

## 2013-03-19 NOTE — Progress Notes (Signed)
Stated that the big toe is doing fine. The bottom of the foot is hurting to walk. 7 week post op check. Surgical wounds are all healed. Right under 5th MPJ area has thick callus at surgical site along the old incision. Upon removal of callus under 5th wound healing appears as expected. Assessment: Wound healing as expected with some residual callus under 5th MPJ right. Plan: The lesion was debrided. Will monitor closely.

## 2013-04-09 ENCOUNTER — Encounter: Payer: Medicaid Other | Admitting: Podiatry

## 2013-05-21 ENCOUNTER — Ambulatory Visit: Payer: Medicaid Other | Admitting: Podiatry

## 2013-05-28 ENCOUNTER — Ambulatory Visit (INDEPENDENT_AMBULATORY_CARE_PROVIDER_SITE_OTHER): Payer: Medicaid Other | Admitting: Podiatry

## 2013-05-28 DIAGNOSIS — L600 Ingrowing nail: Secondary | ICD-10-CM

## 2013-05-28 DIAGNOSIS — M25571 Pain in right ankle and joints of right foot: Secondary | ICD-10-CM

## 2013-05-28 DIAGNOSIS — M21961 Unspecified acquired deformity of right lower leg: Secondary | ICD-10-CM

## 2013-05-28 DIAGNOSIS — M216X9 Other acquired deformities of unspecified foot: Secondary | ICD-10-CM

## 2013-05-28 DIAGNOSIS — M25579 Pain in unspecified ankle and joints of unspecified foot: Secondary | ICD-10-CM

## 2013-05-28 NOTE — Progress Notes (Signed)
Status post excision nail and matrix right hallux with resection of distal phalanx right great toe, and excision of plantar porokeratosis right foot. Patient points thick hard build up eschar under 5th MPJ right foot hurting bad and recurring porokeratosis over old incision at right great toe.  Objective: Hyperkeratosis under 5th MPJ from old incision scar right. New lesion over old incision of the right hallux. Pre-existing porokeratosis under 5th MPJ left foot.  Assessment: Painful porokeratosis secondary to at old skin incision site.  Plan: Right plantar lesion needs be debrided as needed. Lesion over the hallucal incision can be revised with excision of the small dorsal lesion. Reviewed this with patient. All painful lesions debrided. Patient stated that pain has subsided.

## 2013-06-04 ENCOUNTER — Ambulatory Visit (INDEPENDENT_AMBULATORY_CARE_PROVIDER_SITE_OTHER): Payer: Medicaid Other | Admitting: Podiatry

## 2013-06-04 DIAGNOSIS — M25879 Other specified joint disorders, unspecified ankle and foot: Secondary | ICD-10-CM | POA: Insufficient documentation

## 2013-06-04 DIAGNOSIS — IMO0002 Reserved for concepts with insufficient information to code with codable children: Secondary | ICD-10-CM

## 2013-06-04 DIAGNOSIS — M25579 Pain in unspecified ankle and joints of unspecified foot: Secondary | ICD-10-CM

## 2013-06-04 DIAGNOSIS — M25571 Pain in right ankle and joints of right foot: Secondary | ICD-10-CM

## 2013-06-04 MED ORDER — CEPHALEXIN 500 MG PO CAPS: 500.0000 mg | ORAL_CAPSULE | Freq: Three times a day (TID) | ORAL | Status: DC

## 2013-06-04 MED ORDER — HYDROCODONE-ACETAMINOPHEN 7.5-325 MG PO TABS: 1.0000 | ORAL_TABLET | Freq: Four times a day (QID) | ORAL | Status: DC | PRN

## 2013-06-04 NOTE — Progress Notes (Signed)
History of Present Illness: 56 year old female presents for scheduled surgery, excision of painful cystic lesion right great toe. Stated that her blood sugar is at about 135 now. She has had previous surgery over the right hallux. Excised deformed nail and nail bed, and matrix. Distal 1/3 of distal phalangeal bone was removed and skin flap was done. Over the dorsal center suture line there has been a hard inclusion cystic like lesion has formed and caused aggravating pain to the patient. It was determined that it may benefit to have the lesion removed.    Diagnosis: Painful inclusion cyst over old incision site dorsal aspect of the right hallux.  Procedure: Right toe was injected with 5ml of 1% Xylocaine with Epinephrine. Dorsal inclusion cyst(0.4cm) was outlined with #15 blade in size of 1.5 cm x 0.6 cm in elliptical shape horizontally along the outer surface from the old incision. Incision made deep down to the surface of distal phalangeal bone. Lesion was excised out. Cleansed the wound with Normal saline.  Closure placed with 4/0 and 5/0 Nylon. Betadine soaked sterile dressing applied. Rx. Keflex 500 mg tid x 10 days. Hydrocodone 7.5/325 q 6 hr prn pain.

## 2013-06-11 ENCOUNTER — Encounter: Payer: Medicaid Other | Admitting: Podiatry

## 2013-06-12 ENCOUNTER — Ambulatory Visit (INDEPENDENT_AMBULATORY_CARE_PROVIDER_SITE_OTHER): Payer: Medicaid Other | Admitting: Podiatry

## 2013-06-12 DIAGNOSIS — Z9889 Other specified postprocedural states: Secondary | ICD-10-CM

## 2013-06-12 NOTE — Progress Notes (Signed)
1 week since the lesion was cut out from right great toe old incision site. Patient stated that she did fine without any problem or pain. Wound was clean and healing well. Area cleansed with Iodine and dressing reapplied.  Will remove sutures next week.

## 2013-06-25 ENCOUNTER — Encounter: Payer: Self-pay | Admitting: Podiatry

## 2013-06-25 ENCOUNTER — Ambulatory Visit (INDEPENDENT_AMBULATORY_CARE_PROVIDER_SITE_OTHER): Payer: Medicaid Other | Admitting: Podiatry

## 2013-06-25 DIAGNOSIS — Z9889 Other specified postprocedural states: Secondary | ICD-10-CM

## 2013-06-25 NOTE — Patient Instructions (Addendum)
Sutures removed and covered with porous tape. Keep the tape for a few days and change as needed with dispensed supply( 5 pieces of porous tape). Use one piece each time changing the tape. Return in 3 weeks or sooner as needed.

## 2013-06-25 NOTE — Progress Notes (Signed)
Follow up on right great toe after removing skin lesion.  Suture is intact and edges are well coapted. Suture removed and placed with porous tape.  Sample supply dispensed and instruction given. Return in 3 weeks.

## 2013-07-02 ENCOUNTER — Encounter: Payer: Self-pay | Admitting: Podiatry

## 2013-07-02 ENCOUNTER — Ambulatory Visit (INDEPENDENT_AMBULATORY_CARE_PROVIDER_SITE_OTHER): Payer: Medicaid Other | Admitting: Podiatry

## 2013-07-02 VITALS — BP 121/85 | HR 74

## 2013-07-02 DIAGNOSIS — Q828 Other specified congenital malformations of skin: Secondary | ICD-10-CM | POA: Insufficient documentation

## 2013-07-02 DIAGNOSIS — M25572 Pain in left ankle and joints of left foot: Secondary | ICD-10-CM

## 2013-07-02 DIAGNOSIS — M25579 Pain in unspecified ankle and joints of unspecified foot: Secondary | ICD-10-CM

## 2013-07-02 NOTE — Patient Instructions (Addendum)
4 weeks post op removal of porokeratotic lesion at incision line right hallux distal dorsum. Debrided all calluses. Replace the area with dispensed porous tape to approximate and to prevent excess scar formation. Return in 2 weeks.

## 2013-07-02 NOTE — Progress Notes (Signed)
Subjective: 4 weeks post op since removal of porokeratotic eschar lesion from right hallux old incision site. Patient had changed the tape once.  Patient complained of pain under the 5th MPJ left and dystrophic nails.   Objective: Area show outer thick skin is gapping with dry base tissue at the old lesion site. Severe porokeratosis under 5th MPJ left x 2. Less and shallow porokeratotic lesion over incision site sub 5 right. Thick dystrophic nails x 8.  Assessment: Excess scar and gapping of outer incision layer at the lesion site right hallux dorsum. Painful porokeratosis sub 5 left, and eschar formation sub 5 right.  Plan: Area cleansed with Betadine and approximated with porous tape. All calluses debrided and relieved pressure. Patient is to change the tape as needed.  Return in 2 weeks.

## 2013-07-09 ENCOUNTER — Encounter (INDEPENDENT_AMBULATORY_CARE_PROVIDER_SITE_OTHER): Payer: Self-pay

## 2013-07-16 ENCOUNTER — Ambulatory Visit: Payer: Medicaid Other | Admitting: Podiatry

## 2013-07-17 ENCOUNTER — Encounter: Payer: Medicaid Other | Admitting: Podiatry

## 2013-07-31 ENCOUNTER — Ambulatory Visit (INDEPENDENT_AMBULATORY_CARE_PROVIDER_SITE_OTHER): Payer: Medicaid Other | Admitting: Podiatry

## 2013-07-31 ENCOUNTER — Encounter: Payer: Self-pay | Admitting: Podiatry

## 2013-07-31 VITALS — BP 119/81 | HR 74 | Ht 60.0 in | Wt 214.0 lb

## 2013-07-31 DIAGNOSIS — B351 Tinea unguium: Secondary | ICD-10-CM

## 2013-07-31 DIAGNOSIS — E1149 Type 2 diabetes mellitus with other diabetic neurological complication: Secondary | ICD-10-CM

## 2013-07-31 DIAGNOSIS — M25579 Pain in unspecified ankle and joints of unspecified foot: Secondary | ICD-10-CM

## 2013-07-31 DIAGNOSIS — M79609 Pain in unspecified limb: Secondary | ICD-10-CM

## 2013-07-31 DIAGNOSIS — R234 Changes in skin texture: Secondary | ICD-10-CM

## 2013-07-31 NOTE — Patient Instructions (Signed)
Seen for painful calluses. All painful lesions debrided. Return as needed.  

## 2013-07-31 NOTE — Progress Notes (Signed)
Seen for painful eschar over right great toe and under the 5th MPJ right. No edema or erythema noted. Pain from build up eschar over incision site. Assessment: Eschar from surgical incision. Painful Eschar. Plan: All painful lesions debrided. Return as needed.

## 2014-01-22 ENCOUNTER — Ambulatory Visit: Payer: Medicaid Other | Admitting: Podiatry

## 2014-01-27 ENCOUNTER — Other Ambulatory Visit (HOSPITAL_COMMUNITY): Payer: Self-pay | Admitting: Family

## 2014-01-27 DIAGNOSIS — Z1231 Encounter for screening mammogram for malignant neoplasm of breast: Secondary | ICD-10-CM

## 2014-01-29 ENCOUNTER — Encounter: Payer: Self-pay | Admitting: Podiatry

## 2014-01-29 ENCOUNTER — Ambulatory Visit (INDEPENDENT_AMBULATORY_CARE_PROVIDER_SITE_OTHER): Payer: Medicaid Other | Admitting: Podiatry

## 2014-01-29 VITALS — BP 116/77 | HR 72 | Ht 60.0 in | Wt 210.0 lb

## 2014-01-29 DIAGNOSIS — M79606 Pain in leg, unspecified: Secondary | ICD-10-CM | POA: Insufficient documentation

## 2014-01-29 DIAGNOSIS — M216X9 Other acquired deformities of unspecified foot: Secondary | ICD-10-CM

## 2014-01-29 DIAGNOSIS — M79609 Pain in unspecified limb: Secondary | ICD-10-CM

## 2014-01-29 DIAGNOSIS — Q828 Other specified congenital malformations of skin: Secondary | ICD-10-CM

## 2014-01-29 DIAGNOSIS — R234 Changes in skin texture: Secondary | ICD-10-CM

## 2014-01-29 NOTE — Progress Notes (Signed)
Been diabetic since 2011. Blood sugar is under control. Now it runs at 120. Pain on both feet from calluses and a growth in right great toe.   Objective: Multiple plantar porokeratosis, most prominent under 5th MPJ bilateral, left hallux, under 2nd MPJ bilateral. Recurring eschar on right hallux dorsomedial previous nail surgery site, forming 4 mm round hard painful porokeratotic lesion. Neurovascular status are within normal.  Assessment: Intractable porokeratosis multiple plantar bilateral.   Plan: All painful lesions debrided.

## 2014-01-29 NOTE — Patient Instructions (Signed)
Debrided painful calluses. Return in 3 month or as needed.

## 2014-02-27 ENCOUNTER — Ambulatory Visit (HOSPITAL_COMMUNITY)
Admission: RE | Admit: 2014-02-27 | Discharge: 2014-02-27 | Disposition: A | Discharge status: Home / Self Care | Payer: Medicaid Other | Source: Ambulatory Visit | Attending: Family | Admitting: Family

## 2014-02-27 DIAGNOSIS — Z1231 Encounter for screening mammogram for malignant neoplasm of breast: Secondary | ICD-10-CM | POA: Insufficient documentation

## 2014-05-07 ENCOUNTER — Encounter: Payer: Self-pay | Admitting: Podiatry

## 2014-05-07 ENCOUNTER — Ambulatory Visit (INDEPENDENT_AMBULATORY_CARE_PROVIDER_SITE_OTHER): Payer: Medicaid Other | Admitting: Podiatry

## 2014-05-07 VITALS — BP 136/85 | HR 86

## 2014-05-07 DIAGNOSIS — M79609 Pain in unspecified limb: Secondary | ICD-10-CM

## 2014-05-07 DIAGNOSIS — M79606 Pain in leg, unspecified: Secondary | ICD-10-CM

## 2014-05-07 DIAGNOSIS — Q828 Other specified congenital malformations of skin: Secondary | ICD-10-CM

## 2014-05-07 DIAGNOSIS — L600 Ingrowing nail: Secondary | ICD-10-CM

## 2014-05-07 NOTE — Patient Instructions (Signed)
Seen for pain in right toe. All nails and calluses debrided. Return as needed.

## 2014-05-07 NOTE — Progress Notes (Signed)
Subjective: 58 year old female presents complaining of pain on righ tgreat toe nail. Small piece of hard callus is growning back at medial border.  Objective: Plantar callus under 2nd MPJ bilateral, under 5th MPJ right. All nails are hypertrophic.  Neurovascular status are within normal. No gross osseous deformities.   Assessment: Painful calluses and nails.  Mycotic nails x 10.  Plan: Palliation prn. All nails and calluses debrided.

## 2014-05-13 ENCOUNTER — Encounter: Payer: Self-pay | Admitting: Gastroenterology

## 2014-05-19 ENCOUNTER — Encounter: Payer: Self-pay | Admitting: *Deleted

## 2014-05-22 ENCOUNTER — Ambulatory Visit (INDEPENDENT_AMBULATORY_CARE_PROVIDER_SITE_OTHER): Payer: Medicaid Other | Admitting: Gastroenterology

## 2014-05-22 ENCOUNTER — Encounter: Payer: Self-pay | Admitting: Gastroenterology

## 2014-05-22 VITALS — BP 100/70 | HR 70 | Ht 60.0 in | Wt 209.2 lb

## 2014-05-22 DIAGNOSIS — K219 Gastro-esophageal reflux disease without esophagitis: Secondary | ICD-10-CM | POA: Insufficient documentation

## 2014-05-22 DIAGNOSIS — K5900 Constipation, unspecified: Secondary | ICD-10-CM | POA: Insufficient documentation

## 2014-05-22 MED ORDER — LUBIPROSTONE 8 MCG PO CAPS: 8.0000 ug | ORAL_CAPSULE | Freq: Two times a day (BID) | ORAL | Status: DC

## 2014-05-22 NOTE — Patient Instructions (Addendum)
Please make a follow up visit with Dr. Rhea BeltonPyrtle  We have sent the following medications to your pharmacy for you to pick up at your convenience: Amitiza 8 mcg, please take one tablet by mouth twice daily with food

## 2014-05-22 NOTE — Progress Notes (Addendum)
05/22/2014 ELSPETH BLUCHER [id number] 03-18-1956   HISTORY OF PRESENT ILLNESS:  This is a 58 year old female who is new to our practice.  Has PMH of Hepatitis C, DM, chronic pain, alcoholism, depression, HLD, and several foot surgeries.  She is here today to discuss constipation.  She says that she had a colonoscopy last year by [facility], but we do not have those records.  She reports that she's had constipation for several years, which has worsened since she was placed on some of her medications.  Takes several medications including narcotics.  Currently only using magnesium citrate once a week or so, but was using it more frequently previously.  Does not have BM's without using magnesium citrate.  She also reports long-standing GERD.  Has been on several different medications for this in the past.  She is currently on omeprazole 20 mg daily, which she says works well for her.     Past Medical History  Diagnosis Date  . Hepatitis C   . Diabetes mellitus   . Migraine   . Degenerative joint disease   . Chronic pain   . GERD (gastroesophageal reflux disease)   . Bunion   . Callus   . Corns and callosities   . Hypertension   . Substance abuse     alcoholism  . Anemia   . Arthritis   . Depression   . Hyperlipidemia    Past Surgical History  Procedure Laterality Date  . Foot surgery    . Hammertoe repair Left 06/18/2009    Lt #5  . Phalangectomy Left 06/18/2009    LT #1  . Cotton osteotomy w/ graft Left 06/18/2009  . Metatarsal osteotomy Left 06/18/2009    #5  . Nail matrixectomy Left 06/18/2009    LT #1  . Phalangectomy Right 01/30/2013    Rt #1  . Osteotomy Right 01/30/2013    Rt #5  . Excision of benign lesion Right 01/30/2013    Rt Plantar  . Cesarean section      x3  . Colonoscopy      reports that she quit smoking about 2 months ago. Her smoking use included Cigarettes. She smoked 0.50 packs per day. She has never used smokeless tobacco. She reports that she does not  drink alcohol or use illicit drugs. family history includes Cirrhosis in some other family members; Diabetes in an other family member; Heart disease in her mother. There is no history of Colon cancer. No Known Allergies    Outpatient Encounter Prescriptions as of 05/22/2014  Medication Sig  . albuterol (PROVENTIL HFA;VENTOLIN HFA) 108 (90 BASE) MCG/ACT inhaler Inhale 2 puffs into the lungs every 6 (six) hours as needed. For shortness of breath.  . ARIPiprazole (ABILIFY) 2 MG tablet Take 2 mg by mouth daily.    [name] buPROPion (WELLBUTRIN) 100 MG tablet Take 100 mg by mouth 2 (two) times daily.  . cephALEXin (KEFLEX) 500 MG capsule Take 1 capsule (500 mg total) by mouth 3 (three) times daily.  . eszopiclone (LUNESTA) 2 MG TABS Take 3 mg by mouth at bedtime. Take immediately before bedtime  . gabapentin (NEURONTIN) 300 MG capsule Take 300 mg by mouth 3 (three) times daily.  [name] glucose blood test strip 1 each by Other route as needed for other. Use as instructed  . hydrOXYzine (VISTARIL) 25 MG capsule Take 25 mg by mouth 3 (three) times daily as needed for itching.  . insulin aspart (NOVOLOG) 100 UNIT/ML  injection Inject 16 Units into the skin 3 (three) times daily before meals.  . insulin detemir (LEVEMIR) 100 UNIT/ML injection Inject 10 Units into the skin at bedtime.  [name]. linagliptin (TRADJENTA) 5 MG TABS tablet Take 5 mg by mouth daily.  [name]. lisinopril (PRINIVIL,ZESTRIL) 10 MG tablet Take 10 mg by mouth daily.  . meloxicam (MOBIC) 7.5 MG tablet Take 7.5 mg by mouth daily.  [name]. omeprazole (PRILOSEC) 20 MG capsule Take 20 mg by mouth daily.  [name]. oxyCODONE-acetaminophen (PERCOCET) 10-325 MG per tablet Take 1 tablet by mouth every 8 (eight) hours as needed for pain.  [name]. spironolactone (ALDACTONE) 50 MG tablet Take 50 mg by mouth daily.  [name]. sulfamethoxazole-trimethoprim (BACTRIM DS) 800-160 MG per tablet Take 1 tablet by mouth 2 (two) times daily.  . Vitamin D, Ergocalciferol, (DRISDOL) 50000 UNITS CAPS Take  50,000 Units by mouth daily.  [name]. lubiprostone (AMITIZA) 8 MCG capsule Take 1 capsule (8 mcg total) by mouth 2 (two) times daily with a meal.  . [DISCONTINUED] traMADol (ULTRAM) 50 MG tablet Take 50 mg by mouth every 6 (six) hours as needed for pain.     REVIEW OF SYSTEMS  : All other systems reviewed and negative except where noted in the History of Present Illness.   PHYSICAL EXAM: BP 100/70  Pulse 70  Ht 5' (1.524 m)  Wt 209 lb 3.2 oz (94.892 kg)  BMI 40.86 kg/m2 General: Well developed black female in no acute distress Head: Normocephalic and atraumatic Eyes:  Sclerae anicteric, conjunctiva pink. Ears: Normal auditory acuity Lungs: Clear throughout to auscultation Heart: Regular rate and rhythm Abdomen: Soft, non-distended.  Normal bowel sounds.  Non-tender. Musculoskeletal: Symmetrical with no gross deformities  Skin: No lesions on visible extremities Extremities: No edema  Neurological: Alert oriented x 4, grossly non-focal Psychological:  Alert and cooperative. Normal mood and affect  ASSESSMENT AND PLAN: -Constipation:  Likely medication/narcotic induced.  Will start amitiza 8 mcg BID with food.  Can increased to 24 mcg BID if needed. -GERD:  Well-controlled on omeprazole 20 mg daily. -Hepatitis C:  Not addressed at today's visit. -History of alcoholism   *Will get records from [facility] regarding endoscopy reports, pathology, etc.  We are also going to request records for any recent labs from her PCP. *She will follow-up in approximately 6 weeks.  Addendum: Reviewed and agree with initial management. Plan to address hepatitis C at next visit and refer for treatment of chronic active infection Beverley FiedlerJay M Pyrtle, MD   Received records from [facility].  Patient had colonoscopy 11/2011 by Dr. Madilyn FiremanHayes with one 5 mm polyp removed from the transverse colon that was a tubular adenoma without high grade dysplasia.

## 2014-06-06 ENCOUNTER — Ambulatory Visit: Payer: Medicaid Other | Admitting: Endocrinology

## 2014-06-10 ENCOUNTER — Ambulatory Visit (INDEPENDENT_AMBULATORY_CARE_PROVIDER_SITE_OTHER): Payer: Medicaid Other | Admitting: Endocrinology

## 2014-06-10 ENCOUNTER — Encounter: Payer: Self-pay | Admitting: Endocrinology

## 2014-06-10 VITALS — BP 127/70 | HR 73 | Temp 98.9°F | Ht 60.0 in | Wt 212.0 lb

## 2014-06-10 DIAGNOSIS — E1029 Type 1 diabetes mellitus with other diabetic kidney complication: Secondary | ICD-10-CM | POA: Insufficient documentation

## 2014-06-10 LAB — HEMOGLOBIN A1C
Hgb A1c MFr Bld: 11.7 % — ABNORMAL HIGH (ref <5.7)
Mean Plasma Glucose: 289 mg/dL — ABNORMAL HIGH (ref <117)

## 2014-06-10 MED ORDER — ACCU-CHEK AVIVA PLUS W/DEVICE KIT: 1.0000 | PACK | Freq: Once | Status: DC

## 2014-06-10 MED ORDER — GLUCOSE BLOOD VI STRP: 1.0000 | ORAL_STRIP | Freq: Two times a day (BID) | Status: DC

## 2014-06-10 NOTE — Patient Instructions (Addendum)
good diet and exercise habits significanly improve the control of your diabetes.  please let me know if you wish to be referred to a dietician.  high blood sugar is very risky to your health.  you should see an eye doctor and dentist every year.  You are at higher than average risk for pneumonia and hepatitis-B.  You should be vaccinated against both.   controlling your blood pressure and cholesterol drastically reduces the damage diabetes does to your body.  this also applies to quitting smoking.  please discuss these with your doctor.  check your blood sugar twice a day.  vary the time of day when you check, between before the 3 meals, and at bedtime.  also check if you have symptoms of your blood sugar being too high or too low.  please keep a record of the readings and bring it to your next appointment here.  You can write it on any piece of paper.  please call us sooner if your blood sugar goes below 70, or if you have a lot of readings over 200.  i have sent a prescription to your pharmacy, for a meter and strips. we will need to take this complex situation in stages Please continue the same insulin for now.  You should take the humalog just before you eat, and only when you eat. Please come back for a follow-up appointment in 2 weeks.

## 2014-06-10 NOTE — Progress Notes (Signed)
Subjective:    Patient ID: Kara Kelley, female    DOB: 07/05/1956, 58 y.o.   MRN: [id number]  HPI pt states DM was dx'ed in 2010; she has mild if any neuropathy of the lower extremities, but she has associated nephropathy; she has been on insulin since 2012; pt says her diet is poor, but exercise is good; she has never had GDM, pancreatitis, severe hypoglycemia or DKA.  She seldom checks cbg's.  She says it varies from 45-300's.  It is lowest when she takes novolog, but does not eat.  She says she never misses the insulin.   Past Medical History  Diagnosis Date  . Hepatitis C   . Diabetes mellitus   . Migraine   . Degenerative joint disease   . Chronic pain   . GERD (gastroesophageal reflux disease)   . Bunion   . Callus   . Corns and callosities   . Hypertension   . Substance abuse     alcoholism  . Anemia   . Arthritis   . Depression   . Hyperlipidemia     Past Surgical History  Procedure Laterality Date  . Foot surgery    . Hammertoe repair Left 06/18/2009    Lt #5  . Phalangectomy Left 06/18/2009    LT #1  . Cotton osteotomy w/ graft Left 06/18/2009  . Metatarsal osteotomy Left 06/18/2009    #5  . Nail matrixectomy Left 06/18/2009    LT #1  . Phalangectomy Right 01/30/2013    Rt #1  . Osteotomy Right 01/30/2013    Rt #5  . Excision of benign lesion Right 01/30/2013    Rt Plantar  . Cesarean section      x3  . Colonoscopy      History   Social History  . Marital Status: Single    Spouse Name: N/A    Number of Children: 3  . Years of Education: N/A   Occupational History  . disablily    Social History Main Topics  . Smoking status: Former Smoker -- 0.50 packs/day    Types: Cigarettes    Quit date: 03/10/2014  . Smokeless tobacco: Never Used  . Alcohol Use: No  . Drug Use: No  . Sexual Activity: No   Other Topics Concern  . Not on file   Social History Narrative  . No narrative on file    Current Outpatient Prescriptions on File Prior to Visit    Medication Sig Dispense Refill  . albuterol (PROVENTIL HFA;VENTOLIN HFA) 108 (90 BASE) MCG/ACT inhaler Inhale 2 puffs into the lungs every 6 (six) hours as needed. For shortness of breath.      . ARIPiprazole (ABILIFY) 2 MG tablet Take 2 mg by mouth daily.        [name] buPROPion (WELLBUTRIN) 100 MG tablet Take 100 mg by mouth 2 (two) times daily.      . eszopiclone (LUNESTA) 2 MG TABS Take 3 mg by mouth at bedtime. Take immediately before bedtime      . gabapentin (NEURONTIN) 300 MG capsule Take 300 mg by mouth 3 (three) times daily.      [name] glucose blood test strip 1 each by Other route as needed for other. Use as instructed      . hydrOXYzine (VISTARIL) 25 MG capsule Take 25 mg by mouth 3 (three) times daily as needed for itching.      . insulin aspart (NOVOLOG) 100 UNIT/ML injection Inject 16 Units into the skin  3 (three) times daily before meals.      . insulin detemir (LEVEMIR) 100 UNIT/ML injection Inject 10 Units into the skin at bedtime.      [name] linagliptin (TRADJENTA) 5 MG TABS tablet Take 5 mg by mouth daily.      [name] lisinopril (PRINIVIL,ZESTRIL) 10 MG tablet Take 10 mg by mouth daily.      [name] lubiprostone (AMITIZA) 8 MCG capsule Take 1 capsule (8 mcg total) by mouth 2 (two) times daily with a meal.  60 capsule  2  . meloxicam (MOBIC) 7.5 MG tablet Take 7.5 mg by mouth daily.      [name] omeprazole (PRILOSEC) 20 MG capsule Take 20 mg by mouth daily.      [name] oxyCODONE-acetaminophen (PERCOCET) 10-325 MG per tablet Take 1 tablet by mouth every 8 (eight) hours as needed for pain.      [name] spironolactone (ALDACTONE) 50 MG tablet Take 50 mg by mouth daily.      [name] sulfamethoxazole-trimethoprim (BACTRIM DS) 800-160 MG per tablet Take 1 tablet by mouth 2 (two) times daily.      . Vitamin D, Ergocalciferol, (DRISDOL) 50000 UNITS CAPS Take 50,000 Units by mouth daily.       No current facility-administered medications on file prior to visit.    No Known Allergies  Family History  Problem Relation Age of  Onset  . Diabetes      mat great aunt  . Heart disease Mother   . Cirrhosis      mat great aunt  . Cirrhosis      mat great uncles x 2  . Colon cancer Neg Hx     BP 127/70  Pulse 73  Temp(Src) 98.9 F (37.2 C) (Oral)  Ht 5' (1.524 m)  Wt 212 lb (96.163 kg)  BMI 41.40 kg/m2  SpO2 97%   Review of Systems denies blurry vision, headache, chest pain, sob, n/v, urinary frequency, excessive diaphoresis, rhinorrhea.  She has leg cramps.  She has lost a few lbs.  She has frequent urination, depression, easy bruising, and cold intolerance.    Objective:   Physical Exam VS: see vs page GEN: no distress HEAD: head: no deformity eyes: no periorbital swelling, no proptosis external nose and ears are normal mouth: no lesion seen NECK: supple, thyroid is not enlarged CHEST WALL: no deformity LUNGS:  Clear to auscultation CV: reg rate and rhythm, no murmur ABD: abdomen is soft, nontender.  no hepatosplenomegaly.  not distended.  no hernia MUSCULOSKELETAL: muscle bulk and strength are grossly normal.  no obvious joint swelling.  gait is normal and steady EXTEMITIES: no deformity.  no ulcer on the feet.  feet are of normal color and temp.  no edema.  Both great toenails are surgically absent.  PULSES: dorsalis pedis intact bilat.  no carotid bruit. NEURO:  cn 2-12 grossly intact.   readily moves all 4's.  sensation is intact to touch on the feet.  SKIN:  Normal texture and temperature.  No rash or suspicious lesion is visible.   NODES:  None palpable at the neck.   PSYCH: alert, well-oriented.  Does not appear anxious nor depressed.  i reviewed electrocardiogram (2013)  Lab Results  Component Value Date   HGBA1C 11.7* 06/10/2014   i have reviewed the following outside records: Office notes     Assessment & Plan:  DM: severe exacerbation Depression, new to me: this complicates the rx of DM.  I encouraged pt to continue to work with  providers to optimize rx Weight loss, prob due to  severe hyperglycemia.     Patient is advised the following: Patient Instructions  good diet and exercise habits significanly improve the control of your diabetes.  please let me know if you wish to be referred to a dietician.  high blood sugar is very risky to your health.  you should see an eye doctor and dentist every year.  You are at higher than average risk for pneumonia and hepatitis-B.  You should be vaccinated against both.   controlling your blood pressure and cholesterol drastically reduces the damage diabetes does to your body.  this also applies to quitting smoking.  please discuss these with your doctor.  check your blood sugar twice a day.  vary the time of day when you check, between before the 3 meals, and at bedtime.  also check if you have symptoms of your blood sugar being too high or too low.  please keep a record of the readings and bring it to your next appointment here.  You can write it on any piece of paper.  please call us sooner if your blood sugar goes below 70, or if you have a lot of readings over 200.  i have sent a prescription to your pharmacy, for a meter and strips. we will need to take this complex situation in stages Please continue the same insulin for now.  You should take the humalog just before you eat, and only when you eat. Please come back for a follow-up appointment in 2 weeks.

## 2014-06-11 LAB — BASIC METABOLIC PANEL
BUN: 8 mg/dL (ref 6–23)
CO2: 23 meq/L (ref 19–32)
Calcium: 9 mg/dL (ref 8.4–10.5)
Chloride: 103 mEq/L (ref 96–112)
Creat: 0.77 mg/dL (ref 0.50–1.10)
GLUCOSE: 301 mg/dL — AB (ref 70–99)
POTASSIUM: 3.9 meq/L (ref 3.5–5.3)
Sodium: 135 mEq/L (ref 135–145)

## 2014-06-11 LAB — MICROALBUMIN / CREATININE URINE RATIO
Creatinine, Urine: 33.9 mg/dL
MICROALB UR: 0.5 mg/dL (ref 0.00–1.89)
Microalb Creat Ratio: 14.7 mg/g (ref 0.0–30.0)

## 2014-06-11 LAB — TSH: TSH: 0.936 u[IU]/mL (ref 0.350–4.500)

## 2014-06-27 ENCOUNTER — Encounter: Payer: Self-pay | Admitting: Endocrinology

## 2014-06-27 ENCOUNTER — Ambulatory Visit (INDEPENDENT_AMBULATORY_CARE_PROVIDER_SITE_OTHER): Payer: Medicaid Other | Admitting: Endocrinology

## 2014-06-27 VITALS — BP 114/66 | HR 82 | Temp 98.4°F | Wt 211.0 lb

## 2014-06-27 DIAGNOSIS — Z23 Encounter for immunization: Secondary | ICD-10-CM

## 2014-06-27 DIAGNOSIS — E1029 Type 1 diabetes mellitus with other diabetic kidney complication: Secondary | ICD-10-CM

## 2014-06-27 NOTE — Progress Notes (Signed)
Subjective:    Patient ID: Kara Kelley, female    DOB: 28-Aug-1956, 58 y.o.   MRN: [id number]  HPI Pt returns for f/u of diabetes mellitus:  DM type: Insulin-requiring type 2 Dx'ed: 4431 Complications: nephropathy Therapy: insulin since 2012 GDM: never DKA: never Severe hypoglycemia: never Pancreatitis: never Other info: she takes multiple daily injections  Interval history:  no cbg record, but states cbg has not been low since last ov here.  It varies from 97-130.   There is no trend throughout the day.  Polypharmacy: She says she misses 1 injection per day.  Past Medical History  Diagnosis Date  . Hepatitis C   . Diabetes mellitus   . Migraine   . Degenerative joint disease   . Chronic pain   . GERD (gastroesophageal reflux disease)   . Bunion   . Callus   . Corns and callosities   . Hypertension   . Substance abuse     alcoholism  . Anemia   . Arthritis   . Depression   . Hyperlipidemia     Past Surgical History  Procedure Laterality Date  . Foot surgery    . Hammertoe repair Left 06/18/2009    Lt #5  . Phalangectomy Left 06/18/2009    LT #1  . Cotton osteotomy w/ graft Left 06/18/2009  . Metatarsal osteotomy Left 06/18/2009    #5  . Nail matrixectomy Left 06/18/2009    LT #1  . Phalangectomy Right 01/30/2013    Rt #1  . Osteotomy Right 01/30/2013    Rt #5  . Excision of benign lesion Right 01/30/2013    Rt Plantar  . Cesarean section      x3  . Colonoscopy      History   Social History  . Marital Status: Single    Spouse Name: N/A    Number of Children: 3  . Years of Education: N/A   Occupational History  . disablily    Social History Main Topics  . Smoking status: Former Smoker -- 0.50 packs/day    Types: Cigarettes    Quit date: 03/10/2014  . Smokeless tobacco: Never Used  . Alcohol Use: No  . Drug Use: No  . Sexual Activity: No   Other Topics Concern  . Not on file   Social History Narrative  . No narrative on file    Current  Outpatient Prescriptions on File Prior to Visit  Medication Sig Dispense Refill  . albuterol (PROVENTIL HFA;VENTOLIN HFA) 108 (90 BASE) MCG/ACT inhaler Inhale 2 puffs into the lungs every 6 (six) hours as needed. For shortness of breath.      . ARIPiprazole (ABILIFY) 2 MG tablet Take 2 mg by mouth daily.        . Blood Glucose Monitoring Suppl (ACCU-CHEK AVIVA PLUS) W/DEVICE KIT 1 Device by Does not apply route once.  1 kit  0  . buPROPion (WELLBUTRIN) 100 MG tablet Take 100 mg by mouth 2 (two) times daily.      . eszopiclone (LUNESTA) 2 MG TABS Take 3 mg by mouth at bedtime. Take immediately before bedtime      . gabapentin (NEURONTIN) 300 MG capsule Take 300 mg by mouth 3 (three) times daily.      [name] glucose blood (ACCU-CHEK AVIVA) test strip 1 each by Other route 2 (two) times daily. And lancets 2/day 250.43  100 each  12  . glucose blood test strip 1 each by Other route as needed for  other. Use as instructed      . hydrOXYzine (VISTARIL) 25 MG capsule Take 25 mg by mouth 3 (three) times daily as needed for itching.      . insulin aspart (NOVOLOG) 100 UNIT/ML injection Inject 14 Units into the skin 3 (three) times daily before meals.       . insulin detemir (LEVEMIR) 100 UNIT/ML injection Inject 40 Units into the skin at bedtime.       [name] lisinopril (PRINIVIL,ZESTRIL) 10 MG tablet Take 10 mg by mouth daily.      [name] lubiprostone (AMITIZA) 8 MCG capsule Take 1 capsule (8 mcg total) by mouth 2 (two) times daily with a meal.  60 capsule  2  . meloxicam (MOBIC) 7.5 MG tablet Take 7.5 mg by mouth daily.      [name] omeprazole (PRILOSEC) 20 MG capsule Take 20 mg by mouth daily.      [name] oxyCODONE-acetaminophen (PERCOCET) 10-325 MG per tablet Take 1 tablet by mouth every 8 (eight) hours as needed for pain.      [name] spironolactone (ALDACTONE) 50 MG tablet Take 50 mg by mouth daily.      [name] sulfamethoxazole-trimethoprim (BACTRIM DS) 800-160 MG per tablet Take 1 tablet by mouth 2 (two) times daily.      . Vitamin D,  Ergocalciferol, (DRISDOL) 50000 UNITS CAPS Take 50,000 Units by mouth daily.       No current facility-administered medications on file prior to visit.    No Known Allergies  Family History  Problem Relation Age of Onset  . Diabetes      mat great aunt  . Heart disease Mother   . Cirrhosis      mat great aunt  . Cirrhosis      mat great uncles x 2  . Colon cancer Neg Hx     BP 114/66  Pulse 82  Temp(Src) 98.4 F (36.9 C) (Oral)  Wt 211 lb (95.709 kg)  SpO2 94%    Review of Systems She denies hypoglycemia and n/v    Objective:   Physical Exam VITAL SIGNS:  See vs page GENERAL: no distress SKIN:  Insulin injection sites at the triceps areas are normal   Lab Results  Component Value Date   HGBA1C 11.7* 06/10/2014      Assessment & Plan:  DM: apparently well-controlled Noncompliance with cbg recording: I'll work around this as best I can. Polypharmacy: this may be unnecessarily complicating rx of her DM.  Patient is advised the following: Patient Instructions  check your blood sugar twice a day.  vary the time of day when you check, between before the 3 meals, and at bedtime.  also check if you have symptoms of your blood sugar being too high or too low.  please keep a record of the readings and bring it to your next appointment here.  You can write it on any piece of paper.  please call us sooner if your blood sugar goes below 70, or if you have a lot of readings over 200.  i have sent a prescription to your pharmacy, for a meter and strips.   Please continue the same insulin.  Please come back for a follow-up appointment in 2 months.  Please stop taking the tradjenta.

## 2014-06-27 NOTE — Patient Instructions (Addendum)
check your blood sugar twice a day.  vary the time of day when you check, between before the 3 meals, and at bedtime.  also check if you have symptoms of your blood sugar being too high or too low.  please keep a record of the readings and bring it to your next appointment here.  You can write it on any piece of paper.  please call us sooner if your blood sugar goes below 70, or if you have a lot of readings over 200.  i have sent a prescription to your pharmacy, for a meter and strips.   Please continue the same insulin.  Please come back for a follow-up appointment in 2 months.  Please stop taking the tradjenta.

## 2014-07-18 ENCOUNTER — Encounter (HOSPITAL_COMMUNITY): Payer: Self-pay | Admitting: Emergency Medicine

## 2014-07-18 ENCOUNTER — Emergency Department (HOSPITAL_COMMUNITY)
Admission: EM | Admit: 2014-07-18 | Discharge: 2014-07-18 | Disposition: A | Discharge status: Home / Self Care | Payer: No Typology Code available for payment source | Attending: Emergency Medicine | Admitting: Emergency Medicine

## 2014-07-18 DIAGNOSIS — G43909 Migraine, unspecified, not intractable, without status migrainosus: Secondary | ICD-10-CM | POA: Diagnosis not present

## 2014-07-18 DIAGNOSIS — F329 Major depressive disorder, single episode, unspecified: Secondary | ICD-10-CM | POA: Diagnosis not present

## 2014-07-18 DIAGNOSIS — K219 Gastro-esophageal reflux disease without esophagitis: Secondary | ICD-10-CM | POA: Insufficient documentation

## 2014-07-18 DIAGNOSIS — Z79899 Other long term (current) drug therapy: Secondary | ICD-10-CM | POA: Insufficient documentation

## 2014-07-18 DIAGNOSIS — Z862 Personal history of diseases of the blood and blood-forming organs and certain disorders involving the immune mechanism: Secondary | ICD-10-CM | POA: Diagnosis not present

## 2014-07-18 DIAGNOSIS — E119 Type 2 diabetes mellitus without complications: Secondary | ICD-10-CM | POA: Diagnosis not present

## 2014-07-18 DIAGNOSIS — M1990 Unspecified osteoarthritis, unspecified site: Secondary | ICD-10-CM | POA: Diagnosis not present

## 2014-07-18 DIAGNOSIS — Z8619 Personal history of other infectious and parasitic diseases: Secondary | ICD-10-CM | POA: Diagnosis not present

## 2014-07-18 DIAGNOSIS — Z794 Long term (current) use of insulin: Secondary | ICD-10-CM | POA: Diagnosis not present

## 2014-07-18 DIAGNOSIS — I10 Essential (primary) hypertension: Secondary | ICD-10-CM | POA: Diagnosis not present

## 2014-07-18 DIAGNOSIS — T7421XA Adult sexual abuse, confirmed, initial encounter: Secondary | ICD-10-CM | POA: Diagnosis present

## 2014-07-18 DIAGNOSIS — Z87891 Personal history of nicotine dependence: Secondary | ICD-10-CM | POA: Insufficient documentation

## 2014-07-18 DIAGNOSIS — G8929 Other chronic pain: Secondary | ICD-10-CM | POA: Diagnosis not present

## 2014-07-18 DIAGNOSIS — Z872 Personal history of diseases of the skin and subcutaneous tissue: Secondary | ICD-10-CM | POA: Diagnosis not present

## 2014-07-18 MED ORDER — CEFPODOXIME PROXETIL 200 MG PO TABS
400.0000 mg | ORAL_TABLET | Freq: Once | ORAL | Status: DC
  Filled 2014-07-18: qty 2

## 2014-07-18 MED ORDER — CEFIXIME 400 MG PO TABS
ORAL_TABLET | ORAL | Status: AC
  Administered 2014-07-18: 400 mg via ORAL
  Filled 2014-07-18: qty 1

## 2014-07-18 MED ORDER — AZITHROMYCIN 1 G PO PACK
1.0000 g | PACK | Freq: Once | ORAL | Status: DC
  Administered 2014-07-18: 1 g via ORAL
  Filled 2014-07-18: qty 1

## 2014-07-18 MED ORDER — METRONIDAZOLE 500 MG PO TABS
2000.0000 mg | ORAL_TABLET | Freq: Once | ORAL | Status: DC
  Filled 2014-07-18: qty 4

## 2014-07-18 MED ORDER — LEVONORGESTREL 0.75 MG PO TABS
ORAL_TABLET | ORAL | Status: AC
  Filled 2014-07-18: qty 2

## 2014-07-18 MED ORDER — PROMETHAZINE HCL 25 MG PO TABS
ORAL_TABLET | ORAL | Status: AC
  Filled 2014-07-18: qty 3

## 2014-07-18 MED ORDER — CIPROFLOXACIN HCL 500 MG PO TABS: 500.0000 mg | ORAL_TABLET | Freq: Once | ORAL | Status: DC

## 2014-07-18 MED ORDER — CEFIXIME 400 MG PO TABS
400.0000 mg | ORAL_TABLET | Freq: Once | ORAL | Status: AC
  Administered 2014-07-18: 400 mg via ORAL
  Filled 2014-07-18: qty 1

## 2014-07-18 MED ORDER — METRONIDAZOLE 500 MG PO TABS
ORAL_TABLET | ORAL | Status: AC
  Filled 2014-07-18: qty 4

## 2014-07-18 MED ORDER — AZITHROMYCIN 1 G PO PACK: 1.0000 g | PACK | Freq: Once | ORAL | Status: DC

## 2014-07-18 MED ORDER — PROMETHAZINE HCL 25 MG PO TABS
25.0000 mg | ORAL_TABLET | Freq: Four times a day (QID) | ORAL | Status: DC | PRN
  Administered 2014-07-18: 25 mg via ORAL
  Filled 2014-07-18: qty 1

## 2014-07-18 MED ORDER — CEFIXIME 400 MG PO TABS
400.0000 mg | ORAL_TABLET | Freq: Once | ORAL | Status: DC
  Filled 2014-07-18: qty 1

## 2014-07-18 MED ORDER — AZITHROMYCIN 1 G PO PACK
PACK | ORAL | Status: AC
  Administered 2014-07-18: 1 g via ORAL
  Filled 2014-07-18: qty 1

## 2014-07-18 MED ORDER — CEFTRIAXONE SODIUM 250 MG IJ SOLR: 250.0000 mg | Freq: Once | INTRAMUSCULAR | Status: DC

## 2014-07-18 MED ORDER — METRONIDAZOLE 500 MG PO TABS
2000.0000 mg | ORAL_TABLET | Freq: Once | ORAL | Status: AC
  Administered 2014-07-18: 2000 mg via ORAL

## 2014-07-18 NOTE — ED Notes (Signed)
Sane RN Elnita Maxwellheryl notified & on the way to the bedside,

## 2014-07-18 NOTE — ED Notes (Signed)
Pt transported for Sane exam, Elnita MaxwellCheryl, RN will continue pt care

## 2014-07-18 NOTE — Discharge Instructions (Signed)
Sexual Assault or Rape  Sexual assault is any sexual activity that a person is forced, threatened, or coerced into participating in. It may or may not involve physical contact. You are being sexually abused if you are forced to have sexual contact of any kind. Sexual assault is called rape if penetration has occurred (vaginal, oral, or anal). Many times, sexual assaults are committed by a friend, relative, or associate. Sexual assault and rape are never the victim's fault.   Sexual assault can result in various health problems for the person who was assaulted. Some of these problems include:  · Physical injuries in the genital area or other areas of the body.  · Risk of unwanted pregnancy.  · Risk of sexually transmitted infections (STIs).  · Psychological problems such as anxiety, depression, or posttraumatic stress disorder.  WHAT STEPS SHOULD BE TAKEN AFTER A SEXUAL ASSAULT?  If you have been sexually assaulted, you should take the following steps as soon as possible:  · Go to a safe area as quickly as possible and call your local emergency services (911 in [place] Get away from the area where you have been attacked.    · Do not wash, shower, comb your hair, or clean any part of your body.    · Do not change your clothes.    · Do not remove or touch anything in the area where you were assaulted.    · Go to an emergency room for a complete physical exam. Get the necessary tests to protect yourself from STIs or pregnancy. You may be treated for an STI even if no signs of one are present. Emergency contraceptive medicines are also available to help prevent pregnancy, if this is desired. You may need to be examined by a specially trained health care provider.  · Have the health care provider collect evidence during the exam, even if you are not sure if you will file a report with the police.  · Find out how to file the correct papers with the authorities. This is important for all assaults, even if they were committed  by a family member or friend.  · Find out where you can get additional help and support, such as a local rape crisis center.  · Follow up with your health care provider as directed.    HOW CAN YOU REDUCE THE CHANCES OF SEXUAL ASSAULT?  Take the following steps to help reduce your chances of being sexually assaulted:  · Consider carrying mace or pepper spray for protection against an attacker.    · Consider taking a self-defense course.  · Do not try to fight off an attacker if he or she has a gun or knife.    · Be aware of your surroundings, what is happening around you, and who might be there.    · Be assertive, trust your instincts, and walk with confidence and direction.  · Be careful not to drink too much alcohol or use other intoxicants. These can reduce your ability to fight off an assault.  · Always lock your doors and windows. Be sure to have high-quality locks for your home.    · Do not let people enter your house if you do not know them.    · Get a home security system that has a siren if you are able.    · Protect the keys to your house and car. Do not lend them out. Do not put your name and address on them. If you lose them, get your locks changed.    · Always   lock your car and have your key ready to open the door before approaching the car.    · Park in a well-lit and busy area.  · Plan your driving routes so that you travel on well-lit and frequently used streets.   · Keep your car serviced. Always have at least half a tank of gas in it.    · Do not go into isolated areas alone. This includes open garages, empty buildings or offices, or public laundry rooms.    · Do not walk or jog alone, especially when it is dark.    · Never hitchhike.    · If your car breaks down, call the police for help on your cell phone and stay inside the car with your doors locked and windows up.    · If you are being followed, go to a busy area and call for help.    · If you are stopped by a police officer, especially one in  an unmarked police car, keep your door locked. Do not put your window down all the way. Ask the officer to show you identification first.    · Be aware of "date rape drugs" that can be placed in a drink when you are not looking. These drugs can make you unable to fight off an assault.  FOR MORE INFORMATION  · Office on Women's Health, U.S. Department of Health and Human Services: www.womenshealth.gov/violence-against-women/types-of-violence/sexual-assault-and-abuse.html  · National Sexual Assault Hotline: 1-800-656-HOPE (4673)  · National Domestic Violence Hotline: 1-800-799-SAFE (7233) or www.thehotline.org  Document Released: 09/23/2000 Document Revised: 05/29/2013 Document Reviewed: 02/27/2013  ExitCare® Patient Information ©2015 [employer]. This information is not intended to replace advice given to you by your health care provider. Make sure you discuss any questions you have with your health care provider.
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
## 2014-07-18 NOTE — ED Notes (Signed)
Jerrye Bushyheryl, Sane RN at bedside

## 2014-07-18 NOTE — SANE Note (Signed)
For all of the medications you have received:  AVOID HAVING SEXUAL CONTACT UNTIL A WEEK AFTER ALL TREATMENT.  IF YOU HAVE CONTACTED A SEXUALLY TRANSMITTED INFECTION, YOUR PARTNER CAN BECOME INFECTED.  Do not share any of these medications with others.  Store at room temperature, away from light and moisture.  Do not store in the bathroom.  Keep all medicines away from children and pets.  Do not flush medications down the toilet or pour them in the drain.  Properly discard (contact a pharmacy) when a medication is expired or no longer needed.  Possible side effects:    Report to your healthcare provider the following:  Allergic reactions such as skin rash, itching or hives, swelling of the face, lips, or tongue; confusion; nightmares; hallucinations; dark urine or difficulty passing urine; difficulty breathing, hearing loss, irregular heartbeat or chest pain; pale or black stools; redness, blistering, peeling or loosening of the skin including inside the mouth; white patches or sores in the mouth; yellowing of the eyes or skin; feeling anxious or agitated; fever, chills, cough, sore throat or body aches; vomiting within one hour of taking the medicine.  Report only if these become bothersome:  Diarrhea, dizziness, headache, stomach upset or vomiting, tooth discoloration, vaginal irritation, or numbness in part of your body.  Precautions:  Your healthcare provider (HCP) needs to know if you have any of the following conditions:  Kidney disease, liver disease, irregular heartbeat or heart disease, an unusual or allergic reaction to any medications, foods, dyes, preservatives, or if you are pregnant or trying to get pregnant, or are breastfeeding.  Tell your HCP if your symptoms do not improve.  Do not treat diarrhea with over-the-counter products.  Contact your HCP if you have diarrhea that lasts more than 2 days or if it is severe and watery.  Potential interactions:  Question your healthcare provider  if you are taking any of the following medications:  Lincomycin, amiodarone, antacids, cyclosporine (Gengraf, Neoral, Sandimmune), digoxin (Digitek, Lanoxin), dihydroergotamine or ergotamine, Cafergot, Ergomar, Migranal, magnesium, nelfinavir, phenytoin, warfarin (Coumadin), atorvastatin (Lipitor), cetirizine (Zyrtec), medications for HIV or AIDS (efavirenz, indinavir, nelfinavir, zidovudine, Retrovir, Videx, or Viracept), or for seizure (carbamaepine, hexobarbital, phenytoin, Carbartrol, Dilantin, Tegretol, phenobarbital), sodium tetradecyl sulfate, drug or herbal products that induce enzymes such as CYP3A4, amprenavir, bosentan, modafinil, nevirapine, ritonavir, griseofulvin, rifamycins including rifabutin, St. John's Wort, troleandomycin, topiramate, felbamate, alcohol, MAO inhibitors (Nardil, Parnate, Marplan, Eldepryl), trimethobenzamide, bromocriptine, certain antidepressants, certain antihistamines, epinephrine, levodopa, medications for sleep, mental health problems, and psychotic disturbances such as amitriptyline, doxepin, nortriptyline, phenylzine, selegiline, Elavil, Pamelor, Sinequan, or medications for Parkinson's Disease, stomach problems, muscle relaxants, narcotic pain medicines or sedatives, amprenavir oral solution, paclitaxel injection, ritonavir oral solution, sertraline oral solution, sulfamethoxazole-trimethoprim injection, disulfiram (Antabuse), cimetidine (Tagamet), lithium (Eskalith),.  SPECIFIC INSTRUCTIONS FOR EACH MEDICATION (YOU MAY HAVE BEEN GIVEN ALL OR SOME OF THESE:  Azithromycin  (liquid slurry) Also known as: Zithromax, Zmax, Z-pak  Uses:  This is a macrolide antibiotic.  It is used to treat or prevent certain kinds of bacterial infections, including Chlamydia.  This medication may be used for other other purposes, but will not work for viruses such as the cold or flu.  Cefixime  (big pill) Also known as:  Suprax  Uses:  This medication is known as a cephalosporin  antibiotic.  It is used to treat a wide variety of bacterial infections, including Gonorrhea,  Ceftriaxone (Injection/Shot) Also known as:  Rocephin  Uses:  This medication is known as a  cephalosporin antibiotic.  It is used to treat certain kinds of bacterial infections.  Metronidazole (4 pills at once) Also known as:  Flagyl or Helidac Therapy  Uses:  This medication is used to treat certain kinds of baterial and protozoal infections, including Trichomoniasis (otherwise known as Trichomonas or "Trick"), which is an infection of the sex organs in men and women).  Delay taking this medication if you have had any alcohol in the past 48 hours.  Avoid alcohol (including mouthwash and cough medicine) for 48 hours afterward.  Levonorgestrel (little pill(s)) Also known as:  Plan B or Next Choice  Uses:  This medication is used in women to prevent pregnancy after unprotected sex or after failure of another birth control method.  It is a progestin hormone that helps to prevent pregnancy by delaying ovulation (the release of an egg) and possibly changing the uterine & cervical mucus to make it more difficult for fertilization (when the egg and sperm meet), or for the fertilized egg to attach to the uterus (implantation).  Using this medicine will not not stop an existing pregnancy or protect you against Sexually Transmitted Infections (STIs).  This medication should not be used as a regular form of birth control.  This medication works best if taken with 72 hours (3 days) of unprotected sex.  If you vomit with 2 hours of taking the medication, consider calling a pharmacy about repeating the dose.  This medication can be used at any time during your menstrual cycle, and your period amount and timing can be irregular after taking it, during the first month or two.  If your period is more than 7 days late, you may want to take a pregnancy test.  Promethazine (pack of 3 for home use) Also known as:   Phenergan  Uses:  This medication is an antihistamine.  It can be used to treat allergic reactions and to treat or prevent nausea and vomiting.  It is also used to make you sleep and to help treat pain or nausea.  Take 1/2 to 1 whole tablet as needed for nausea or sleep.  Do not take doses any closer than 6 hours.  If unable to swallow the pill, it may be placed in the vagina or rectum with the same results (there may be some tingling noted).  Do not drive or be responsible for the care of young children as this medication will make you drowsy.

## 2014-07-24 ENCOUNTER — Ambulatory Visit: Payer: Medicaid Other | Admitting: Internal Medicine

## 2014-07-24 ENCOUNTER — Telehealth: Payer: Self-pay | Admitting: Endocrinology

## 2014-07-24 NOTE — Telephone Encounter (Signed)
Ov next available 

## 2014-07-24 NOTE — Telephone Encounter (Signed)
a1c is 13.7 and blood sugar was 377 platelets are at 117 and rbc is 5.35  [facility] calling to give us this info trajenta has been stopped but is non-compliant with blood sugar monitoring

## 2014-07-24 NOTE — Telephone Encounter (Signed)
Requested call back for pt to schedule appointment.

## 2014-07-24 NOTE — Telephone Encounter (Signed)
See below, Thanks!  

## 2014-07-31 ENCOUNTER — Ambulatory Visit: Payer: Medicaid Other | Admitting: *Deleted

## 2014-08-11 ENCOUNTER — Encounter (HOSPITAL_COMMUNITY): Payer: Self-pay | Admitting: Emergency Medicine

## 2014-08-15 ENCOUNTER — Encounter: Payer: Self-pay | Admitting: Podiatry

## 2014-08-15 ENCOUNTER — Ambulatory Visit (INDEPENDENT_AMBULATORY_CARE_PROVIDER_SITE_OTHER): Payer: Medicaid Other | Admitting: Podiatry

## 2014-08-15 VITALS — BP 140/84 | HR 68

## 2014-08-15 DIAGNOSIS — R234 Changes in skin texture: Secondary | ICD-10-CM

## 2014-08-15 DIAGNOSIS — L600 Ingrowing nail: Secondary | ICD-10-CM

## 2014-08-15 DIAGNOSIS — Q828 Other specified congenital malformations of skin: Secondary | ICD-10-CM

## 2014-08-15 DIAGNOSIS — M79604 Pain in right leg: Secondary | ICD-10-CM

## 2014-08-15 DIAGNOSIS — M79605 Pain in left leg: Secondary | ICD-10-CM

## 2014-08-15 DIAGNOSIS — B351 Tinea unguium: Secondary | ICD-10-CM | POA: Insufficient documentation

## 2014-08-15 NOTE — Progress Notes (Signed)
Subjective: 58 year old female presents complaining of pain on righ tgreat toe nail. Small piece of hard callus is growning back at medial border. Also request toe nails trimmed.   Objective: Plantar callus under 2nd MPJ bilateral, under 5th MPJ right. Abnormal nail growth at medial corner of right great toe. This toe has had previous total matrixectomy.  All nails are hypertrophic.  Neurovascular status are within normal. No gross osseous deformities.   Assessment: Painful calluses and nails.  Recurring nail right great toe and painful. Mycotic nails x 10.  Plan: Palliation prn. All nails and calluses debrided.  Will excise abnormal nail growth on the right great toe on next visit.

## 2014-08-15 NOTE — Patient Instructions (Signed)
Seen for painful feet. All nails, corns, and calluses debrided. Had painful nail piece on right great toe. We will do excisional procedure next month. Return in one month.

## 2014-08-27 ENCOUNTER — Ambulatory Visit: Payer: Medicaid Other | Admitting: Endocrinology

## 2014-09-16 ENCOUNTER — Encounter: Payer: Self-pay | Admitting: Podiatry

## 2014-09-16 ENCOUNTER — Ambulatory Visit (INDEPENDENT_AMBULATORY_CARE_PROVIDER_SITE_OTHER): Payer: Medicaid Other | Admitting: Podiatry

## 2014-09-16 VITALS — BP 155/94 | HR 67

## 2014-09-16 DIAGNOSIS — M79604 Pain in right leg: Secondary | ICD-10-CM

## 2014-09-16 DIAGNOSIS — L720 Epidermal cyst: Secondary | ICD-10-CM | POA: Insufficient documentation

## 2014-09-16 DIAGNOSIS — M795 Residual foreign body in soft tissue: Secondary | ICD-10-CM

## 2014-09-16 MED ORDER — OXYCODONE-ACETAMINOPHEN 7.5-325 MG PO TABS: 1.0000 | ORAL_TABLET | Freq: Three times a day (TID) | ORAL | Status: DC | PRN

## 2014-09-16 NOTE — Progress Notes (Signed)
Patient presents to have scheduled procedure on right great toe. Procedure done: Excision lesion right great toe dorsum, 1.5cm x 0.5cm. Dx. Painful porokeratosis with abnormal nail growth. Local used: Total 4ml of 50/50 mixture 0.5% Marcaine and 1% Xylocaine with epinephrine.  Patient tolerated well. Return in one week.

## 2014-09-16 NOTE — Patient Instructions (Signed)
Office surgery done. Keep bandage dry. Return in one week.

## 2014-09-17 ENCOUNTER — Encounter (HOSPITAL_COMMUNITY): Payer: Self-pay | Admitting: Family Medicine

## 2014-09-17 ENCOUNTER — Emergency Department (HOSPITAL_COMMUNITY)
Admission: EM | Admit: 2014-09-17 | Discharge: 2014-09-17 | Disposition: A | Discharge status: Home / Self Care | Payer: Medicaid Other | Attending: Emergency Medicine | Admitting: Emergency Medicine

## 2014-09-17 DIAGNOSIS — H00039 Abscess of eyelid unspecified eye, unspecified eyelid: Secondary | ICD-10-CM

## 2014-09-17 DIAGNOSIS — Z872 Personal history of diseases of the skin and subcutaneous tissue: Secondary | ICD-10-CM | POA: Insufficient documentation

## 2014-09-17 DIAGNOSIS — H05019 Cellulitis of unspecified orbit: Secondary | ICD-10-CM | POA: Insufficient documentation

## 2014-09-17 DIAGNOSIS — I10 Essential (primary) hypertension: Secondary | ICD-10-CM | POA: Insufficient documentation

## 2014-09-17 DIAGNOSIS — G8929 Other chronic pain: Secondary | ICD-10-CM | POA: Diagnosis not present

## 2014-09-17 DIAGNOSIS — Z862 Personal history of diseases of the blood and blood-forming organs and certain disorders involving the immune mechanism: Secondary | ICD-10-CM | POA: Insufficient documentation

## 2014-09-17 DIAGNOSIS — Z8619 Personal history of other infectious and parasitic diseases: Secondary | ICD-10-CM | POA: Diagnosis not present

## 2014-09-17 DIAGNOSIS — Z79899 Other long term (current) drug therapy: Secondary | ICD-10-CM | POA: Diagnosis not present

## 2014-09-17 DIAGNOSIS — Z794 Long term (current) use of insulin: Secondary | ICD-10-CM | POA: Insufficient documentation

## 2014-09-17 DIAGNOSIS — G43909 Migraine, unspecified, not intractable, without status migrainosus: Secondary | ICD-10-CM | POA: Insufficient documentation

## 2014-09-17 DIAGNOSIS — M1990 Unspecified osteoarthritis, unspecified site: Secondary | ICD-10-CM | POA: Insufficient documentation

## 2014-09-17 DIAGNOSIS — Z87891 Personal history of nicotine dependence: Secondary | ICD-10-CM | POA: Insufficient documentation

## 2014-09-17 DIAGNOSIS — E119 Type 2 diabetes mellitus without complications: Secondary | ICD-10-CM | POA: Diagnosis not present

## 2014-09-17 DIAGNOSIS — K219 Gastro-esophageal reflux disease without esophagitis: Secondary | ICD-10-CM | POA: Insufficient documentation

## 2014-09-17 DIAGNOSIS — F329 Major depressive disorder, single episode, unspecified: Secondary | ICD-10-CM | POA: Diagnosis not present

## 2014-09-17 DIAGNOSIS — R51 Headache: Secondary | ICD-10-CM | POA: Diagnosis present

## 2014-09-17 MED ORDER — DIPHENHYDRAMINE HCL 25 MG PO CAPS
50.0000 mg | ORAL_CAPSULE | Freq: Once | ORAL | Status: AC
  Administered 2014-09-17: 50 mg via ORAL
  Filled 2014-09-17: qty 2

## 2014-09-17 MED ORDER — CLINDAMYCIN HCL 150 MG PO CAPS: 300.0000 mg | ORAL_CAPSULE | Freq: Three times a day (TID) | ORAL | Status: DC

## 2014-09-17 NOTE — ED Notes (Signed)
Pt here for redness and swelling to face around eyes. sts also itching.

## 2014-09-17 NOTE — Discharge Instructions (Signed)
Periorbital Cellulitis °Periorbital cellulitis is a common infection that can affect the eyelid and the soft tissues that surround the eyeball. The infection may also affect the structures that produce and drain tears. It does not affect the eyeball itself. Natural tissue barriers usually prevent the spread of this infection to the eyeball and other deeper areas of the eye socket.      °CAUSES °· Bacterial infection. °· Long-term (chronic) sinus infections. °· An object (foreign body) stuck behind the eye. °· An injury that goes through the eyelid tissues. °· An injury that causes an infection, such as an insect sting. °· Fracture of the bone around the eye. °· Infections which have spread from the eyelid or other structures around the eye. °· Bite wounds. °· Inflammation or infection of the lining membranes of the brain (meningitis). °· An infection in the blood (septicemia). °· Dental infection (abscess). °· Viral infection (this is rare). °SYMPTOMS °Symptoms usually come on suddenly. °· Pain in the eye. °· Red, hot, and swollen eyelids and possibly cheeks. The swelling is sometimes bad enough that the eyelids cannot open. Some infections make the eyelids look purple. °· Fever and feeling generally ill. °· Pain when touching the area around the eye. °DIAGNOSIS  °Periorbital cellulitis can be diagnosed from an eye exam. In severe cases, your caregiver might suggest: °· Blood tests. °· Imaging tests (such as a CT scan) to examine the sinuses and the area around and behind the eyeball. °TREATMENT °If your caregiver feels that you do not have any signs of serious infection, treatment may include: °· Antibiotics. °· Nasal decongestants to reduce swelling. °· Referral to a dentist if it is suspected that the infection was caused by a prior tooth infection. °· Examination every day to make sure the problem is improving. °HOME CARE INSTRUCTIONS °· Take your antibiotics as directed. Finish them even if you start to feel  better. °· Some pain is normal with this condition. Take pain medicine as directed by your caregiver. Only take pain medicines approved by your caregiver. °· It is important to drink fluids. Drink enough water and fluids to keep your urine clear or pale yellow. °· Do not smoke. °· Rest and get plenty of sleep. °· Mild or moderate fevers generally have no long-term effects and often do not require treatment. °· If your caregiver has given you a follow-up appointment, it is very important to keep that appointment. Your caregiver will need to make sure that the infection is getting better. It is important to check that a more serious infection is not developing. °SEEK IMMEDIATE MEDICAL CARE IF: °· Your eyelids become more painful, red, warm, or swollen. °· You develop double vision or your vision becomes blurred or worsens in any way. °· You have trouble moving your eyes. °· The eye looks like it is popping out (proptosis). °· You develop a severe headache, severe neck pain, or neck stiffness. °· You develop repeated vomiting. °· You have a fever or persistent symptoms for more than 72 hours. °· You have a fever and your symptoms suddenly get worse. °MAKE SURE YOU: °· Understand these instructions. °· Will watch your condition. °· Will get help right away if you are not doing well or get worse. °Document Released: 10/29/2010 Document Revised: 12/19/2011 Document Reviewed: 10/29/2010 °[employer]® Patient Information ©2015 [employer]. This information is not intended to replace advice given to you by your health care provider. Make sure you discuss any questions you have with your health care provider. ° °

## 2014-09-17 NOTE — ED Provider Notes (Signed)
CSN: [id number]     Arrival date & time 09/17/14  1132 History  This chart was scribed for non-physician practitioner, Montine Circle, PA-C working with Fredia Sorrow, MD by Frederich Balding, ED scribe. This patient was seen in room TR10C/TR10C and the patient's care was started at 12:19 PM.    Chief Complaint  Patient presents with  . Facial Pain   The history is provided by the patient. No language interpreter was used.    HPI Comments: Kara Kelley is a 58 y.o. female with history of diabetes who presents to the Emergency Department complaining of upper eyelid itching that started 2 days ago. She has used Vaseline over her eyes with no relief. Reports associated bilateral eyelid swelling and redness that started this morning. Pt recently started using a different soap. States she has also had to stay with her daughter, who has 2 dogs. Denies fever. Pt's sugars have been around 100.   Past Medical History  Diagnosis Date  . Hepatitis C   . Diabetes mellitus   . Migraine   . Degenerative joint disease   . Chronic pain   . GERD (gastroesophageal reflux disease)   . Bunion   . Callus   . Corns and callosities   . Hypertension   . Substance abuse     alcoholism  . Anemia   . Arthritis   . Depression   . Hyperlipidemia    Past Surgical History  Procedure Laterality Date  . Foot surgery    . Hammertoe repair Left 06/18/2009    Lt #5  . Phalangectomy Left 06/18/2009    LT #1  . Cotton osteotomy w/ graft Left 06/18/2009  . Metatarsal osteotomy Left 06/18/2009    #5  . Nail matrixectomy Left 06/18/2009    LT #1  . Phalangectomy Right 01/30/2013    Rt #1  . Osteotomy Right 01/30/2013    Rt #5  . Excision of benign lesion Right 01/30/2013    Rt Plantar  . Cesarean section      x3  . Colonoscopy     Family History  Problem Relation Age of Onset  . Diabetes      mat great aunt  . Heart disease Mother   . Cirrhosis      mat great aunt  . Cirrhosis      mat great uncles x 2   . Colon cancer Neg Hx    History  Substance Use Topics  . Smoking status: Former Smoker -- 0.50 packs/day    Types: Cigarettes    Quit date: 03/10/2014  . Smokeless tobacco: Never Used  . Alcohol Use: No   OB History    Gravida Para Term Preterm AB TAB SAB Ectopic Multiple Living   '4 3   1 1    3     ' Review of Systems  Constitutional: Negative for fever.  HENT: Positive for facial swelling.   Eyes: Negative for redness.  Respiratory: Negative for shortness of breath.   Cardiovascular: Negative for chest pain.  Gastrointestinal: Negative for abdominal distention.  Musculoskeletal: Negative for gait problem.  Skin: Negative for rash.  Neurological: Negative for speech difficulty.  Psychiatric/Behavioral: Negative for confusion.   Allergies  Review of patient's allergies indicates no known allergies.  Home Medications   Prior to Admission medications   Medication Sig Start Date End Date Taking? Authorizing Provider  albuterol (PROVENTIL HFA;VENTOLIN HFA) 108 (90 BASE) MCG/ACT inhaler Inhale 2 puffs into the lungs every 6 (six)  hours as needed. For shortness of breath.    Historical Provider, MD  ARIPiprazole (ABILIFY) 2 MG tablet Take 2 mg by mouth daily.      Historical Provider, MD  Blood Glucose Monitoring Suppl (ACCU-CHEK AVIVA PLUS) W/DEVICE KIT 1 Device by Does not apply route once. 06/10/14   Renato Shin, MD  buPROPion (WELLBUTRIN) 100 MG tablet Take 100 mg by mouth 2 (two) times daily.    Historical Provider, MD  cholecalciferol (VITAMIN D) 1000 UNITS tablet Take 1,000 Units by mouth daily.    Historical Provider, MD  gabapentin (NEURONTIN) 300 MG capsule Take 300 mg by mouth 3 (three) times daily.    Historical Provider, MD  glucose blood (ACCU-CHEK AVIVA) test strip 1 each by Other route 2 (two) times daily. And lancets 2/day 250.43 06/10/14   Renato Shin, MD  glucose blood test strip 1 each by Other route as needed for other. Use as instructed    Historical Provider,  MD  insulin aspart (NOVOLOG) 100 UNIT/ML injection Inject 18 Units into the skin 3 (three) times daily before meals.     Historical Provider, MD  insulin detemir (LEVEMIR) 100 UNIT/ML injection Inject 50 Units into the skin at bedtime.     Historical Provider, MD  lisinopril (PRINIVIL,ZESTRIL) 10 MG tablet Take 10 mg by mouth daily.    Historical Provider, MD  omeprazole (PRILOSEC) 20 MG capsule Take 20 mg by mouth daily.    Historical Provider, MD  oxyCODONE-acetaminophen (PERCOCET) 7.5-325 MG per tablet Take 1 tablet by mouth every 8 (eight) hours as needed for pain. 09/16/14   Myeong Sheard, DPM  spironolactone (ALDACTONE) 50 MG tablet Take 50 mg by mouth daily.    Historical Provider, MD  zolpidem (AMBIEN) 10 MG tablet Take 10 mg by mouth at bedtime as needed for sleep.    Historical Provider, MD   BP 155/91 mmHg  Pulse 77  Temp(Src) 98.3 F (36.8 C)  Resp 18  SpO2 98%   Physical Exam  Constitutional: She is oriented to person, place, and time. She appears well-developed and well-nourished. No distress.  HENT:  Head: Normocephalic and atraumatic.  Mild periorbital erythema. No evidence of entrapment or abscess.   Eyes: Conjunctivae and EOM are normal.  Normal conjunctiva.   Cardiovascular: Normal rate and regular rhythm.   Pulmonary/Chest: Effort normal and breath sounds normal. No stridor. No respiratory distress.  Abdominal: She exhibits no distension.  Musculoskeletal: She exhibits no edema.  Neurological: She is alert and oriented to person, place, and time. No cranial nerve deficit.  Skin: Skin is warm and dry.  Psychiatric: She has a normal mood and affect.  Nursing note and vitals reviewed.   ED Course  Procedures (including critical care time)  DIAGNOSTIC STUDIES: Oxygen Saturation is 98% on RA, normal by my interpretation.    COORDINATION OF CARE: 12:22 PM-Discussed treatment plan which includes clindamycin and benadryl with pt at bedside and pt agreed to plan.  Return precautions given.   Labs Review Labs Reviewed - No data to display  Imaging Review No results found.   EKG Interpretation None      MDM   Final diagnoses:  Preseptal cellulitis, unspecified laterality    Patient with physical exam findings remarkable for probable preseptal cellulitis, but possibly just an allergic reaction. Will cover with clindamycin, and treat with Benadryl. Return precautions given. Patient understands and agrees with the plan. No evidence of periorbital cellulitis, entrapment, or deep space infection.  I personally performed the  services described in this documentation, which was scribed in my presence. The recorded information has been reviewed and is accurate.  Montine Circle, PA-C 09/17/14 1228  Fredia Sorrow, MD 09/18/14 [phone]

## 2014-09-23 ENCOUNTER — Ambulatory Visit (INDEPENDENT_AMBULATORY_CARE_PROVIDER_SITE_OTHER): Payer: Medicaid Other | Admitting: Podiatry

## 2014-09-23 ENCOUNTER — Encounter: Payer: Self-pay | Admitting: Podiatry

## 2014-09-23 DIAGNOSIS — L720 Epidermal cyst: Secondary | ICD-10-CM

## 2014-09-23 NOTE — Progress Notes (Signed)
One week follow up after right great toe excision lesion, remnant nail growth post surgical. Wound is dry and well coapted. Area cleansed with Betadine solution. Amerigel ointment dressing applied. Will keep the suture for another 2 weeks.

## 2014-09-23 NOTE — Patient Instructions (Signed)
Post op wound right great toe doing well. Return in 2 weeks.

## 2014-10-01 ENCOUNTER — Emergency Department (HOSPITAL_COMMUNITY)
Admission: EM | Admit: 2014-10-01 | Discharge: 2014-10-01 | Disposition: A | Discharge status: Home / Self Care | Payer: Medicaid Other | Attending: Emergency Medicine | Admitting: Emergency Medicine

## 2014-10-01 ENCOUNTER — Encounter (HOSPITAL_COMMUNITY): Payer: Self-pay | Admitting: Family Medicine

## 2014-10-01 DIAGNOSIS — F329 Major depressive disorder, single episode, unspecified: Secondary | ICD-10-CM | POA: Insufficient documentation

## 2014-10-01 DIAGNOSIS — Z872 Personal history of diseases of the skin and subcutaneous tissue: Secondary | ICD-10-CM | POA: Diagnosis not present

## 2014-10-01 DIAGNOSIS — I10 Essential (primary) hypertension: Secondary | ICD-10-CM | POA: Diagnosis not present

## 2014-10-01 DIAGNOSIS — Z87891 Personal history of nicotine dependence: Secondary | ICD-10-CM | POA: Insufficient documentation

## 2014-10-01 DIAGNOSIS — Z792 Long term (current) use of antibiotics: Secondary | ICD-10-CM | POA: Insufficient documentation

## 2014-10-01 DIAGNOSIS — E119 Type 2 diabetes mellitus without complications: Secondary | ICD-10-CM | POA: Diagnosis not present

## 2014-10-01 DIAGNOSIS — M1990 Unspecified osteoarthritis, unspecified site: Secondary | ICD-10-CM | POA: Diagnosis not present

## 2014-10-01 DIAGNOSIS — G43909 Migraine, unspecified, not intractable, without status migrainosus: Secondary | ICD-10-CM | POA: Diagnosis not present

## 2014-10-01 DIAGNOSIS — G8929 Other chronic pain: Secondary | ICD-10-CM | POA: Insufficient documentation

## 2014-10-01 DIAGNOSIS — Z79899 Other long term (current) drug therapy: Secondary | ICD-10-CM | POA: Insufficient documentation

## 2014-10-01 DIAGNOSIS — Z8619 Personal history of other infectious and parasitic diseases: Secondary | ICD-10-CM | POA: Insufficient documentation

## 2014-10-01 DIAGNOSIS — K219 Gastro-esophageal reflux disease without esophagitis: Secondary | ICD-10-CM | POA: Diagnosis not present

## 2014-10-01 DIAGNOSIS — Z794 Long term (current) use of insulin: Secondary | ICD-10-CM | POA: Diagnosis not present

## 2014-10-01 DIAGNOSIS — L089 Local infection of the skin and subcutaneous tissue, unspecified: Secondary | ICD-10-CM | POA: Diagnosis present

## 2014-10-01 DIAGNOSIS — L259 Unspecified contact dermatitis, unspecified cause: Secondary | ICD-10-CM | POA: Diagnosis not present

## 2014-10-01 DIAGNOSIS — Z862 Personal history of diseases of the blood and blood-forming organs and certain disorders involving the immune mechanism: Secondary | ICD-10-CM | POA: Insufficient documentation

## 2014-10-01 MED ORDER — PREDNISONE 20 MG PO TABS: 60.0000 mg | ORAL_TABLET | Freq: Every day | ORAL | Status: DC

## 2014-10-01 MED ORDER — HYDROXYZINE HCL 50 MG/ML IM SOLN
50.0000 mg | Freq: Four times a day (QID) | INTRAMUSCULAR | Status: DC | PRN
  Filled 2014-10-01: qty 1

## 2014-10-01 MED ORDER — PREDNISONE 20 MG PO TABS
60.0000 mg | ORAL_TABLET | Freq: Once | ORAL | Status: AC
  Administered 2014-10-01: 60 mg via ORAL
  Filled 2014-10-01: qty 3

## 2014-10-01 MED ORDER — HYDROXYZINE HCL 25 MG PO TABS: 25.0000 mg | ORAL_TABLET | Freq: Four times a day (QID) | ORAL | Status: DC | PRN

## 2014-10-01 MED ORDER — HYDROCORTISONE 1 % EX CREA: TOPICAL_CREAM | CUTANEOUS | Status: DC

## 2014-10-01 NOTE — Discharge Planning (Signed)
Kara Kelley J. Clydene Laming, RN, [place], [place] [phone] ED CM consulted to meet with patient concerning f/u care with PCP and patient does not have insurance. Pt presented to [facility] ED today with recurrent skin infections.  Met with patient at bedside, confirmed informaton. Pt  reports not having access to f/u care with PCP ans Medicaid. Discussed with patient importance and benefits of  establishing PCP, and not utilizing the ED for primary care needs. Pt verbalized understanding and is in agreement. Discussed other options, provided list of local  affordable PCPs.  Pt voiced  Interest in the [facility]. NCM made appt for 10/13/14 @ 3:00.  Pt aware of appt and states she will have no problem getting there.

## 2014-10-01 NOTE — ED Provider Notes (Signed)
TIME SEEN: 12:32 PM  CHIEF COMPLAINT: Facial rash  HPI: Pt is a 58 y.o. F history of hepatitis C, diabetes, hypertension, hyperlipidemia who presents to the emergency department with pruritic, red, raised rash around her eyes for the past 2-3 weeks. She was seen in the emergency department on 09/17/14 for the same was told she may have preseptal cellulitis versus an allergic reaction. She was started on clindamycin without any relief. She does report that she has been around her daughter's dogs recently and thinks this may be contributing. Patient reports that when she is not around her daughter's dogs for several days her rash resolves. She has been using Benadryl without relief from the itching. No eye pain, drainage, fevers, chills. No vision changes.  ROS: See HPI Constitutional: no fever  Eyes: no drainage  ENT: no runny nose   Cardiovascular:  no chest pain  Resp: no SOB  GI: no vomiting GU: no dysuria Integumentary:  rash  Allergy: no hives  Musculoskeletal: no leg swelling  Neurological: no slurred speech ROS otherwise negative  PAST MEDICAL HISTORY/PAST SURGICAL HISTORY:  Past Medical History  Diagnosis Date  . Hepatitis C   . Diabetes mellitus   . Migraine   . Degenerative joint disease   . Chronic pain   . GERD (gastroesophageal reflux disease)   . Bunion   . Callus   . Corns and callosities   . Hypertension   . Substance abuse     alcoholism  . Anemia   . Arthritis   . Depression   . Hyperlipidemia     MEDICATIONS:  Prior to Admission medications   Medication Sig Start Date End Date Taking? Authorizing Provider  albuterol (PROVENTIL HFA;VENTOLIN HFA) 108 (90 BASE) MCG/ACT inhaler Inhale 2 puffs into the lungs every 6 (six) hours as needed. For shortness of breath.    Historical Provider, MD  ARIPiprazole (ABILIFY) 2 MG tablet Take 2 mg by mouth daily.      Historical Provider, MD  Blood Glucose Monitoring Suppl (ACCU-CHEK AVIVA PLUS) W/DEVICE KIT 1 Device by  Does not apply route once. 06/10/14   Renato Shin, MD  buPROPion (WELLBUTRIN) 100 MG tablet Take 100 mg by mouth 2 (two) times daily.    Historical Provider, MD  cholecalciferol (VITAMIN D) 1000 UNITS tablet Take 1,000 Units by mouth daily.    Historical Provider, MD  clindamycin (CLEOCIN) 150 MG capsule Take 2 capsules (300 mg total) by mouth 3 (three) times daily. May dispense as 174m capsules 09/17/14   RMontine Circle PA-C  gabapentin (NEURONTIN) 300 MG capsule Take 300 mg by mouth 3 (three) times daily.    Historical Provider, MD  glucose blood (ACCU-CHEK AVIVA) test strip 1 each by Other route 2 (two) times daily. And lancets 2/day 250.43 06/10/14   SRenato Shin MD  glucose blood test strip 1 each by Other route as needed for other. Use as instructed    Historical Provider, MD  insulin aspart (NOVOLOG) 100 UNIT/ML injection Inject 18 Units into the skin 3 (three) times daily before meals.     Historical Provider, MD  insulin detemir (LEVEMIR) 100 UNIT/ML injection Inject 50 Units into the skin at bedtime.     Historical Provider, MD  lisinopril (PRINIVIL,ZESTRIL) 10 MG tablet Take 10 mg by mouth daily.    Historical Provider, MD  omeprazole (PRILOSEC) 20 MG capsule Take 20 mg by mouth daily.    Historical Provider, MD  oxyCODONE-acetaminophen (PERCOCET) 7.5-325 MG per tablet Take 1 tablet  by mouth every 8 (eight) hours as needed for pain. 09/16/14   Myeong Sheard, DPM  spironolactone (ALDACTONE) 50 MG tablet Take 50 mg by mouth daily.    Historical Provider, MD  zolpidem (AMBIEN) 10 MG tablet Take 10 mg by mouth at bedtime as needed for sleep.    Historical Provider, MD    ALLERGIES:  No Known Allergies  SOCIAL HISTORY:  History  Substance Use Topics  . Smoking status: Former Smoker -- 0.50 packs/day    Types: Cigarettes    Quit date: 03/10/2014  . Smokeless tobacco: Never Used  . Alcohol Use: No    FAMILY HISTORY: Family History  Problem Relation Age of Onset  . Diabetes       mat great aunt  . Heart disease Mother   . Cirrhosis      mat great aunt  . Cirrhosis      mat great uncles x 2  . Colon cancer Neg Hx     EXAM: BP 124/78 mmHg  Pulse 84  Temp(Src) 98.4 F (36.9 C) (Oral)  Resp 20  Wt 211 lb 1.6 oz (95.754 kg)  SpO2 100% CONSTITUTIONAL: Alert and oriented and responds appropriately to questions. Well-appearing; well-nourished HEAD: Normocephalic EYES: Conjunctivae clear, PERRL, extraocular movements intact, no photophobia, no pain with consensual light response ENT: normal nose; no rhinorrhea; moist mucous membranes; pharynx without lesions noted NECK: Supple, no meningismus, no LAD  CARD: RRR; S1 and S2 appreciated; no murmurs, no clicks, no rubs, no gallops RESP: Normal chest excursion without splinting or tachypnea; breath sounds clear and equal bilaterally; no wheezes, no rhonchi, no rales,  ABD/GI: Normal bowel sounds; non-distended; soft, non-tender, no rebound, no guarding BACK:  The back appears normal and is non-tender to palpation, there is no CVA tenderness EXT: Normal ROM in all joints; non-tender to palpation; no edema; normal capillary refill; no cyanosis    SKIN: Normal color for age and race; warm, slightly raised erythematous Papular rash around the eyes with minimal amount of edema of the upper eyelids bilaterally with no vesicles, no warmth or drainage or induration NEURO: Moves all extremities equally PSYCH: The patient's mood and manner are appropriate. Grooming and personal hygiene are appropriate.  MEDICAL DECISION MAKING: Patient here with what appears to be an allergic reaction around her eyes. Suspect contact dermatitis. She was treated recently for possible preseptal cellulitis reports no improvement. She has no fever, no warmth of this rash is red, raised and pruritic. I do not feel she needs any further antibiotics or imaging of her face. I feel she can be started on a steroid taper and given Vistaril for symptom relief.  She reports she does not have a primary care physician for follow-up. We'll consult case management.  ED PROGRESS: Case management has seen the patient. She has an appointment for outpatient follow-up with [facility] on January 4 at 3 PM. [facility] discharge with prescription for Vistaril to use as needed, prednisone taper. Have advised her not to put any cosmetics, lotions or soaps on her face in the meantime and to try to avoid her daughters pet dogs. Discussed return precautions. She verbalized understanding and is comfortable with plan.     [place], DO 10/01/14 1626

## 2014-10-01 NOTE — ED Notes (Signed)
Pt made aware to return if symptoms worsen or if any life threatening symptoms occur.   

## 2014-10-01 NOTE — ED Notes (Addendum)
Pt reports cellulitis surrounding bilateral eyes diagnosed on 09/17/14 at [facility].  Pt was told to follow up with dr. Omelia Blackwaterheaden and could not due to "his name being taken off the medicaid list."Pt reports increased pain, swelling and redness.  Pt took clindamycin.

## 2014-10-01 NOTE — ED Notes (Signed)
Pt here for redness, swelling and itching around eyes. Seen here recently for the same and not better after abx. sts was dx with cellulitis.

## 2014-10-01 NOTE — Discharge Instructions (Signed)

## 2014-10-07 ENCOUNTER — Ambulatory Visit (INDEPENDENT_AMBULATORY_CARE_PROVIDER_SITE_OTHER): Payer: Medicaid Other | Admitting: Podiatry

## 2014-10-07 ENCOUNTER — Encounter: Payer: Self-pay | Admitting: Podiatry

## 2014-10-07 VITALS — BP 118/72 | HR 83

## 2014-10-07 DIAGNOSIS — Q828 Other specified congenital malformations of skin: Secondary | ICD-10-CM

## 2014-10-07 DIAGNOSIS — M79606 Pain in leg, unspecified: Secondary | ICD-10-CM

## 2014-10-07 DIAGNOSIS — B351 Tinea unguium: Secondary | ICD-10-CM | POA: Diagnosis not present

## 2014-10-07 NOTE — Progress Notes (Signed)
Right great toe wound is healing well. Incision site well coapted. No edema or erythema. Wound is dry.  Sutures removed. Mefix tape applied.  All nails and calluses debrided as per request.  Return in 2 weeks to replace Mefix tape.

## 2014-10-07 NOTE — Patient Instructions (Signed)
Suture removed from right great toe. All nails and calluses debrided. Return in 2 weeks.

## 2014-10-13 ENCOUNTER — Inpatient Hospital Stay: Payer: Medicaid Other | Admitting: Family Medicine

## 2014-10-21 ENCOUNTER — Encounter: Payer: Self-pay | Admitting: Podiatry

## 2014-10-21 ENCOUNTER — Ambulatory Visit (INDEPENDENT_AMBULATORY_CARE_PROVIDER_SITE_OTHER): Payer: Medicaid Other | Admitting: Podiatry

## 2014-10-21 VITALS — BP 130/86 | HR 74

## 2014-10-21 DIAGNOSIS — L720 Epidermal cyst: Secondary | ICD-10-CM

## 2014-10-21 NOTE — Patient Instructions (Signed)
Post op wound right great toe. Healing well.  Keep it wrapped. Return as needed.

## 2014-10-21 NOTE — Progress Notes (Signed)
6 weeks post op wound. Right great toe wound is healing well. Incision site well coapted. No edema or erythema.  Wound is dry.  Mefix tape applied.  Return as needed.

## 2015-03-09 ENCOUNTER — Encounter (HOSPITAL_COMMUNITY): Payer: Self-pay | Admitting: *Deleted

## 2015-03-09 ENCOUNTER — Emergency Department (HOSPITAL_COMMUNITY)
Admission: EM | Admit: 2015-03-09 | Discharge: 2015-03-09 | Disposition: A | Discharge status: Home / Self Care | Payer: Medicaid Other | Attending: Emergency Medicine | Admitting: Emergency Medicine

## 2015-03-09 ENCOUNTER — Emergency Department (HOSPITAL_COMMUNITY): Payer: Medicaid Other

## 2015-03-09 DIAGNOSIS — G8929 Other chronic pain: Secondary | ICD-10-CM | POA: Insufficient documentation

## 2015-03-09 DIAGNOSIS — Z79899 Other long term (current) drug therapy: Secondary | ICD-10-CM | POA: Insufficient documentation

## 2015-03-09 DIAGNOSIS — I10 Essential (primary) hypertension: Secondary | ICD-10-CM | POA: Diagnosis not present

## 2015-03-09 DIAGNOSIS — Z862 Personal history of diseases of the blood and blood-forming organs and certain disorders involving the immune mechanism: Secondary | ICD-10-CM | POA: Diagnosis not present

## 2015-03-09 DIAGNOSIS — S3992XA Unspecified injury of lower back, initial encounter: Secondary | ICD-10-CM | POA: Diagnosis present

## 2015-03-09 DIAGNOSIS — S300XXA Contusion of lower back and pelvis, initial encounter: Secondary | ICD-10-CM

## 2015-03-09 DIAGNOSIS — Y998 Other external cause status: Secondary | ICD-10-CM | POA: Insufficient documentation

## 2015-03-09 DIAGNOSIS — Y9289 Other specified places as the place of occurrence of the external cause: Secondary | ICD-10-CM | POA: Diagnosis not present

## 2015-03-09 DIAGNOSIS — Y9389 Activity, other specified: Secondary | ICD-10-CM | POA: Diagnosis not present

## 2015-03-09 DIAGNOSIS — Z8619 Personal history of other infectious and parasitic diseases: Secondary | ICD-10-CM | POA: Diagnosis not present

## 2015-03-09 DIAGNOSIS — Z87891 Personal history of nicotine dependence: Secondary | ICD-10-CM | POA: Insufficient documentation

## 2015-03-09 DIAGNOSIS — Z7952 Long term (current) use of systemic steroids: Secondary | ICD-10-CM | POA: Diagnosis not present

## 2015-03-09 DIAGNOSIS — W108XXA Fall (on) (from) other stairs and steps, initial encounter: Secondary | ICD-10-CM | POA: Insufficient documentation

## 2015-03-09 DIAGNOSIS — E119 Type 2 diabetes mellitus without complications: Secondary | ICD-10-CM | POA: Diagnosis not present

## 2015-03-09 DIAGNOSIS — F329 Major depressive disorder, single episode, unspecified: Secondary | ICD-10-CM | POA: Diagnosis not present

## 2015-03-09 DIAGNOSIS — Z794 Long term (current) use of insulin: Secondary | ICD-10-CM | POA: Insufficient documentation

## 2015-03-09 DIAGNOSIS — G43909 Migraine, unspecified, not intractable, without status migrainosus: Secondary | ICD-10-CM | POA: Insufficient documentation

## 2015-03-09 DIAGNOSIS — Z872 Personal history of diseases of the skin and subcutaneous tissue: Secondary | ICD-10-CM | POA: Insufficient documentation

## 2015-03-09 DIAGNOSIS — M1990 Unspecified osteoarthritis, unspecified site: Secondary | ICD-10-CM | POA: Diagnosis not present

## 2015-03-09 DIAGNOSIS — W19XXXA Unspecified fall, initial encounter: Secondary | ICD-10-CM

## 2015-03-09 DIAGNOSIS — K219 Gastro-esophageal reflux disease without esophagitis: Secondary | ICD-10-CM | POA: Diagnosis not present

## 2015-03-09 MED ORDER — IBUPROFEN 800 MG PO TABS: 800.0000 mg | ORAL_TABLET | Freq: Three times a day (TID) | ORAL | Status: DC | PRN

## 2015-03-09 MED ORDER — HYDROCODONE-ACETAMINOPHEN 5-325 MG PO TABS: 1.0000 | ORAL_TABLET | Freq: Four times a day (QID) | ORAL | Status: DC | PRN

## 2015-03-09 NOTE — ED Provider Notes (Signed)
CSN: [id number]     Arrival date & time 03/09/15  1329 History   First MD Initiated Contact with Patient 03/09/15 1353     Chief Complaint  Patient presents with  . Fall     (Consider location/radiation/quality/duration/timing/severity/associated sxs/prior Treatment) HPI Patient presents to the emergency department with lower back pain following a fall that occurred on Saturday.  The patient states that she tripped down some stairs, landing against a brick stairs.  Patient states that the pain got worse over the last few days.  The patient states she did not take any medications at home prior to arrival.  Patient states movement and palpation makes the pain worse.  The patient denies abdominal pain, nausea, vomiting, weakness, dizziness, headache, blurred vision, neck pain, chest pain, shortness of breath or syncope.  Patient states that she has no tingling or numbness into her legs Past Medical History  Diagnosis Date  . Hepatitis C   . Diabetes mellitus   . Migraine   . Degenerative joint disease   . Chronic pain   . GERD (gastroesophageal reflux disease)   . Bunion   . Callus   . Corns and callosities   . Hypertension   . Substance abuse     alcoholism  . Anemia   . Arthritis   . Depression   . Hyperlipidemia    Past Surgical History  Procedure Laterality Date  . Foot surgery    . Hammertoe repair Left 06/18/2009    Lt #5  . Phalangectomy Left 06/18/2009    LT #1  . Cotton osteotomy w/ graft Left 06/18/2009  . Metatarsal osteotomy Left 06/18/2009    #5  . Nail matrixectomy Left 06/18/2009    LT #1  . Phalangectomy Right 01/30/2013    Rt #1  . Osteotomy Right 01/30/2013    Rt #5  . Excision of benign lesion Right 01/30/2013    Rt Plantar  . Cesarean section      x3  . Colonoscopy     Family History  Problem Relation Age of Onset  . Diabetes      mat great aunt  . Heart disease Mother   . Cirrhosis      mat great aunt  . Cirrhosis      mat great uncles x 2  . Colon  cancer Neg Hx    History  Substance Use Topics  . Smoking status: Former Smoker -- 0.50 packs/day    Types: Cigarettes    Quit date: 03/10/2014  . Smokeless tobacco: Never Used  . Alcohol Use: No   OB History    Gravida Para Term Preterm AB TAB SAB Ectopic Multiple Living   '4 3   1 1    3     ' Review of Systems   All other systems negative except as documented in the HPI. All pertinent positives and negatives as reviewed in the HPI. Allergies  Review of patient's allergies indicates no known allergies.  Home Medications   Prior to Admission medications   Medication Sig Start Date End Date Taking? Authorizing Provider  albuterol (PROVENTIL HFA;VENTOLIN HFA) 108 (90 BASE) MCG/ACT inhaler Inhale 2 puffs into the lungs every 6 (six) hours as needed. For shortness of breath.    Historical Provider, MD  ARIPiprazole (ABILIFY) 2 MG tablet Take 2 mg by mouth daily.      Historical Provider, MD  Blood Glucose Monitoring Suppl (ACCU-CHEK AVIVA PLUS) W/DEVICE KIT 1 Device by Does not apply route once.  06/10/14   Renato Shin, MD  buPROPion (WELLBUTRIN) 100 MG tablet Take 100 mg by mouth 2 (two) times daily.    Historical Provider, MD  clindamycin (CLEOCIN) 150 MG capsule Take 2 capsules (300 mg total) by mouth 3 (three) times daily. May dispense as 130m capsules 09/17/14   RMontine Circle PA-C  gabapentin (NEURONTIN) 300 MG capsule Take 300 mg by mouth 3 (three) times daily.    Historical Provider, MD  glucose blood (ACCU-CHEK AVIVA) test strip 1 each by Other route 2 (two) times daily. And lancets 2/day 250.43 06/10/14   SRenato Shin MD  hydrocortisone cream 1 % Apply to affected area 2 times daily 10/01/14   Kristen N Ward, DO  hydrOXYzine (ATARAX/VISTARIL) 25 MG tablet Take 1 tablet (25 mg total) by mouth every 6 (six) hours as needed for itching. 10/01/14   Kristen N Ward, DO  insulin aspart (NOVOLOG) 100 UNIT/ML injection Inject 18 Units into the skin 3 (three) times daily before meals.      Historical Provider, MD  insulin detemir (LEVEMIR) 100 UNIT/ML injection Inject 50 Units into the skin at bedtime.     Historical Provider, MD  omeprazole (PRILOSEC) 20 MG capsule Take 20 mg by mouth daily.    Historical Provider, MD  oxyCODONE-acetaminophen (PERCOCET) 7.5-325 MG per tablet Take 1 tablet by mouth every 8 (eight) hours as needed for pain. 09/16/14   Myeong O Sheard, DPM  predniSONE (DELTASONE) 20 MG tablet Take 3 tablets (60 mg total) by mouth daily. Take 60 mg a day (3 tablets) for 3 days then 40 mg a day (2 tablets) for 3 days then 20 mg a day (one tablet) for 3 days then take 10 mg a day (1/2 tablet) for 4 days and then stop 10/01/14   Kristen N Ward, DO  spironolactone (ALDACTONE) 50 MG tablet Take 50 mg by mouth daily.    Historical Provider, MD  zolpidem (AMBIEN) 10 MG tablet Take 10 mg by mouth at bedtime as needed for sleep.    Historical Provider, MD   BP 127/69 mmHg  Pulse 83  Temp(Src) 98 F (36.7 C) (Oral)  Resp 20  Wt 212 lb (96.163 kg)  SpO2 100% Physical Exam  Constitutional: She is oriented to person, place, and time. She appears well-developed and well-nourished. No distress.  HENT:  Head: Normocephalic and atraumatic.  Mouth/Throat: Oropharynx is clear and moist.  Eyes: Pupils are equal, round, and reactive to light.  Neck: Normal range of motion.  Cardiovascular: Normal rate, regular rhythm and normal heart sounds.  Exam reveals no gallop and no friction rub.   No murmur heard. Pulmonary/Chest: Effort normal and breath sounds normal. No respiratory distress.  Musculoskeletal:       Back:  Neurological: She is alert and oriented to person, place, and time. Coordination normal.  Skin: Skin is warm and dry. No rash noted. No erythema.  Nursing note and vitals reviewed.   ED Course  Procedures (including critical care time) Labs Review Labs Reviewed - No data to display  Imaging Review Dg Lumbar Spine Complete  03/09/2015   CLINICAL DATA:  Pt  complains of lower right back pain and right buttock pain radiating into right hip since falling down brick steps outside 2 days ago; pt states she landed on her right buttock; no prior history of injury or surgery  EXAM: LUMBAR SPINE - COMPLETE 4+ VIEW  COMPARISON:  None.  FINDINGS: No fracture. There is a grade 1 anterolisthesis of L4  on L5, degenerative in origin. There is moderate loss disc height at this level. There is a minimal anterolisthesis of L3 and L4. No other spondylolisthesis. Remaining disc spaces are well maintained.  There is facet degenerative change bilaterally at L4-L5 with facet joint widening. Main facet joints are relatively well preserved a  Soft tissues are unremarkable.  IMPRESSION: No fracture or acute finding. Degenerative changes as described most evident at L4-L5.   Electronically Signed   By: Lajean Manes M.D.   On: 03/09/2015 15:02   Dg Hip Unilat With Pelvis 2-3 Views Right  03/09/2015   CLINICAL DATA:  Right low back/buttock pain radiating to hip since fall 2 days ago  EXAM: RIGHT HIP (WITH PELVIS) 2-3 VIEWS  COMPARISON:  None.  FINDINGS: No fracture or dislocation is seen.  Bilateral hip joint spaces are preserved.  Visualized soft tissues are within normal limits.  IMPRESSION: No fracture or dislocation is seen.   Electronically Signed   By: Julian Hy M.D.   On: 03/09/2015 15:04   Patient be treated for lumbar strain and contusion.  Told to use ice and heat on her low back.  Patient is advised to return here as needed.  Told to follow-up with her primary care doctor  Dalia Heading, PA-C 03/09/15 [place], MD 03/16/15 [phone]

## 2015-03-09 NOTE — Discharge Instructions (Signed)
Return here as needed.  Follow-up with your primary care doctor.  Your x-rays did not show any abnormalities that are new from the fall

## 2015-03-09 NOTE — ED Notes (Signed)
Pt in stating she fell on Saturday, denies pain at that time but since then she has noted pain to lower back, right hip and bilateral legs, history of chronic pain, no distress noted

## 2015-03-09 NOTE — ED Notes (Signed)
Patient transported to X-ray 

## 2015-03-31 ENCOUNTER — Ambulatory Visit: Payer: Medicaid Other | Admitting: Podiatry

## 2015-04-21 ENCOUNTER — Ambulatory Visit (INDEPENDENT_AMBULATORY_CARE_PROVIDER_SITE_OTHER): Payer: Medicaid Other | Admitting: Podiatry

## 2015-04-21 ENCOUNTER — Encounter: Payer: Self-pay | Admitting: Podiatry

## 2015-04-21 VITALS — BP 118/82 | HR 89

## 2015-04-21 DIAGNOSIS — M79673 Pain in unspecified foot: Secondary | ICD-10-CM

## 2015-04-21 DIAGNOSIS — M79606 Pain in leg, unspecified: Secondary | ICD-10-CM

## 2015-04-21 DIAGNOSIS — B351 Tinea unguium: Secondary | ICD-10-CM | POA: Diagnosis not present

## 2015-04-21 NOTE — Progress Notes (Signed)
Subjective: 59 year old female presents complaining of pain under both feet from callus and also wants nails trimmed.   Objective: Plantar callus under 2nd MPJ bilateral, under 5th MPJ right. All nails are hypertrophic.  Neurovascular status are within normal. No gross osseous deformities.   Assessment: Painful calluses and nails.  Mycotic nails x 10.  Plan: Palliation prn. All nails and calluses debrided.

## 2015-04-21 NOTE — Patient Instructions (Signed)
Seen for hypertrophic nails. All nails debrided. Return in 3 months or as needed.  

## 2015-04-22 ENCOUNTER — Ambulatory Visit: Payer: Medicaid Other | Admitting: Podiatry

## 2015-06-18 ENCOUNTER — Emergency Department (HOSPITAL_COMMUNITY): Payer: Medicaid Other

## 2015-06-18 ENCOUNTER — Encounter (HOSPITAL_COMMUNITY): Payer: Self-pay | Admitting: Emergency Medicine

## 2015-06-18 ENCOUNTER — Emergency Department (HOSPITAL_COMMUNITY)
Admission: EM | Admit: 2015-06-18 | Discharge: 2015-06-18 | Disposition: A | Discharge status: Home / Self Care | Payer: Medicaid Other | Attending: Emergency Medicine | Admitting: Emergency Medicine

## 2015-06-18 DIAGNOSIS — Z794 Long term (current) use of insulin: Secondary | ICD-10-CM | POA: Insufficient documentation

## 2015-06-18 DIAGNOSIS — K219 Gastro-esophageal reflux disease without esophagitis: Secondary | ICD-10-CM | POA: Diagnosis not present

## 2015-06-18 DIAGNOSIS — K8020 Calculus of gallbladder without cholecystitis without obstruction: Secondary | ICD-10-CM | POA: Insufficient documentation

## 2015-06-18 DIAGNOSIS — G8929 Other chronic pain: Secondary | ICD-10-CM | POA: Diagnosis not present

## 2015-06-18 DIAGNOSIS — Z79899 Other long term (current) drug therapy: Secondary | ICD-10-CM | POA: Diagnosis not present

## 2015-06-18 DIAGNOSIS — Z872 Personal history of diseases of the skin and subcutaneous tissue: Secondary | ICD-10-CM | POA: Insufficient documentation

## 2015-06-18 DIAGNOSIS — E1165 Type 2 diabetes mellitus with hyperglycemia: Secondary | ICD-10-CM

## 2015-06-18 DIAGNOSIS — E785 Hyperlipidemia, unspecified: Secondary | ICD-10-CM | POA: Insufficient documentation

## 2015-06-18 DIAGNOSIS — F329 Major depressive disorder, single episode, unspecified: Secondary | ICD-10-CM | POA: Insufficient documentation

## 2015-06-18 DIAGNOSIS — M1990 Unspecified osteoarthritis, unspecified site: Secondary | ICD-10-CM | POA: Diagnosis not present

## 2015-06-18 DIAGNOSIS — Z862 Personal history of diseases of the blood and blood-forming organs and certain disorders involving the immune mechanism: Secondary | ICD-10-CM | POA: Insufficient documentation

## 2015-06-18 DIAGNOSIS — Z87891 Personal history of nicotine dependence: Secondary | ICD-10-CM | POA: Insufficient documentation

## 2015-06-18 DIAGNOSIS — R1011 Right upper quadrant pain: Secondary | ICD-10-CM | POA: Diagnosis present

## 2015-06-18 LAB — COMPREHENSIVE METABOLIC PANEL
ALK PHOS: 73 U/L (ref 38–126)
ALT: 26 U/L (ref 14–54)
AST: 33 U/L (ref 15–41)
Albumin: 3.8 g/dL (ref 3.5–5.0)
Anion gap: 14 (ref 5–15)
BUN: 8 mg/dL (ref 6–20)
CALCIUM: 10.3 mg/dL (ref 8.9–10.3)
CO2: 24 mmol/L (ref 22–32)
CREATININE: 0.9 mg/dL (ref 0.44–1.00)
Chloride: 97 mmol/L — ABNORMAL LOW (ref 101–111)
Glucose, Bld: 345 mg/dL — ABNORMAL HIGH (ref 65–99)
Potassium: 3.3 mmol/L — ABNORMAL LOW (ref 3.5–5.1)
Sodium: 135 mmol/L (ref 135–145)
Total Bilirubin: 1.1 mg/dL (ref 0.3–1.2)
Total Protein: 8 g/dL (ref 6.5–8.1)

## 2015-06-18 LAB — URINALYSIS, ROUTINE W REFLEX MICROSCOPIC
Bilirubin Urine: NEGATIVE
Hgb urine dipstick: NEGATIVE
KETONES UR: 40 mg/dL — AB
Leukocytes, UA: NEGATIVE
Nitrite: NEGATIVE
PROTEIN: NEGATIVE mg/dL
Specific Gravity, Urine: 1.017 (ref 1.005–1.030)
Urobilinogen, UA: 0.2 mg/dL (ref 0.0–1.0)
pH: 6.5 (ref 5.0–8.0)

## 2015-06-18 LAB — CBC WITH DIFFERENTIAL/PLATELET
BASOS PCT: 1 % (ref 0–1)
Basophils Absolute: 0.1 10*3/uL (ref 0.0–0.1)
EOS PCT: 1 % (ref 0–5)
Eosinophils Absolute: 0.1 10*3/uL (ref 0.0–0.7)
HCT: 40.6 % (ref 36.0–46.0)
HEMOGLOBIN: 14.1 g/dL (ref 12.0–15.0)
LYMPHS PCT: 30 % (ref 12–46)
Lymphs Abs: 1.7 10*3/uL (ref 0.7–4.0)
MCH: 26.3 pg (ref 26.0–34.0)
MCHC: 34.7 g/dL (ref 30.0–36.0)
MCV: 75.7 fL — AB (ref 78.0–100.0)
MONOS PCT: 9 % (ref 3–12)
Monocytes Absolute: 0.5 10*3/uL (ref 0.1–1.0)
NEUTROS PCT: 59 % (ref 43–77)
Neutro Abs: 3.4 10*3/uL (ref 1.7–7.7)
Platelets: 101 10*3/uL — ABNORMAL LOW (ref 150–400)
RBC: 5.36 MIL/uL — AB (ref 3.87–5.11)
RDW: 13.4 % (ref 11.5–15.5)
WBC: 5.8 10*3/uL (ref 4.0–10.5)

## 2015-06-18 LAB — CBG MONITORING, ED
GLUCOSE-CAPILLARY: 311 mg/dL — AB (ref 65–99)
Glucose-Capillary: 286 mg/dL — ABNORMAL HIGH (ref 65–99)

## 2015-06-18 LAB — TROPONIN I

## 2015-06-18 LAB — URINE MICROSCOPIC-ADD ON

## 2015-06-18 LAB — LIPASE, BLOOD: LIPASE: 23 U/L (ref 22–51)

## 2015-06-18 MED ORDER — PANTOPRAZOLE SODIUM 40 MG IV SOLR
40.0000 mg | INTRAVENOUS | Status: AC
  Administered 2015-06-18: 40 mg via INTRAVENOUS
  Filled 2015-06-18: qty 40

## 2015-06-18 MED ORDER — SODIUM CHLORIDE 0.9 % IV BOLUS (SEPSIS)
1000.0000 mL | Freq: Once | INTRAVENOUS | Status: AC
  Administered 2015-06-18: 1000 mL via INTRAVENOUS

## 2015-06-18 MED ORDER — INSULIN DETEMIR 100 UNIT/ML ~~LOC~~ SOLN
50.0000 [IU] | Freq: Once | SUBCUTANEOUS | Status: AC
  Administered 2015-06-18: 50 [IU] via SUBCUTANEOUS
  Filled 2015-06-18: qty 0.5

## 2015-06-18 MED ORDER — POTASSIUM CHLORIDE CRYS ER 20 MEQ PO TBCR
40.0000 meq | EXTENDED_RELEASE_TABLET | Freq: Once | ORAL | Status: AC
  Administered 2015-06-18: 40 meq via ORAL
  Filled 2015-06-18: qty 2

## 2015-06-18 MED ORDER — MORPHINE SULFATE (PF) 4 MG/ML IV SOLN
4.0000 mg | Freq: Once | INTRAVENOUS | Status: AC
  Administered 2015-06-18: 4 mg via INTRAVENOUS
  Filled 2015-06-18: qty 1

## 2015-06-18 MED ORDER — OMEPRAZOLE 20 MG PO CPDR: 20.0000 mg | DELAYED_RELEASE_CAPSULE | Freq: Every day | ORAL | Status: DC

## 2015-06-18 MED ORDER — ONDANSETRON HCL 4 MG/2ML IJ SOLN
4.0000 mg | Freq: Once | INTRAMUSCULAR | Status: AC
  Administered 2015-06-18: 4 mg via INTRAVENOUS
  Filled 2015-06-18: qty 2

## 2015-06-18 MED ORDER — PROMETHAZINE HCL 25 MG PO TABS: 25.0000 mg | ORAL_TABLET | Freq: Four times a day (QID) | ORAL | Status: DC | PRN

## 2015-06-18 MED ORDER — OXYCODONE-ACETAMINOPHEN 5-325 MG PO TABS: 1.0000 | ORAL_TABLET | ORAL | Status: DC | PRN

## 2015-06-18 NOTE — ED Notes (Signed)
Pt stated "It's been going on for 7 days, it started before that but it has gotten worse.  I tried to get my omeprazole from the drug store but they told me they had to re-submit it to the doctor."

## 2015-06-18 NOTE — ED Notes (Signed)
Patient transported to X-ray 

## 2015-06-18 NOTE — ED Provider Notes (Signed)
CSN: [id number]     Arrival date & time 06/18/15  1123 History   First MD Initiated Contact with Patient 06/18/15 1206     Chief Complaint  Patient presents with  . Back Pain  . Abdominal Pain     (Consider location/radiation/quality/duration/timing/severity/associated sxs/prior Treatment) HPI Comments: 59 year old female with extensive past medical history including GERD, hepatitis C, type 2 diabetes mellitus, hypertension, chronic pain who presents with abdominal pain and reflux. The patient states that 2 weeks ago she began having right upper quadrant and mid epigastric abdominal pain as well as severe heartburn. She has been off of her omeprazole for the past several weeks because she could not get a refill. She has taken Tums without relief. She states that the pain and burning discomfort radiate up into her chest. For the past several days, she has felt like food gets stuck in her throat and she has not wanted to eat. No difference solids versus liquids. She has been able to tolerate water. She has had nausea but no vomiting or fevers. She endorses constipation. No urinary symptoms. No shortness of breath.  Patient is a 59 y.o. female presenting with back pain and abdominal pain. The history is provided by the patient.  Back Pain Associated symptoms: abdominal pain   Abdominal Pain   Past Medical History  Diagnosis Date  . Hepatitis C   . Diabetes mellitus   . Migraine   . Degenerative joint disease   . Chronic pain   . GERD (gastroesophageal reflux disease)   . Bunion   . Callus   . Corns and callosities   . Hypertension   . Substance abuse     alcoholism  . Anemia   . Arthritis   . Depression   . Hyperlipidemia    Past Surgical History  Procedure Laterality Date  . Foot surgery    . Hammertoe repair Left 06/18/2009    Lt #5  . Phalangectomy Left 06/18/2009    LT #1  . Cotton osteotomy w/ graft Left 06/18/2009  . Metatarsal osteotomy Left 06/18/2009    #5  . Nail  matrixectomy Left 06/18/2009    LT #1  . Phalangectomy Right 01/30/2013    Rt #1  . Osteotomy Right 01/30/2013    Rt #5  . Excision of benign lesion Right 01/30/2013    Rt Plantar  . Cesarean section      x3  . Colonoscopy     Family History  Problem Relation Age of Onset  . Diabetes      mat great aunt  . Heart disease Mother   . Cirrhosis      mat great aunt  . Cirrhosis      mat great uncles x 2  . Colon cancer Neg Hx    Social History  Substance Use Topics  . Smoking status: Former Smoker -- 0.50 packs/day    Types: Cigarettes    Quit date: 03/10/2014  . Smokeless tobacco: Never Used  . Alcohol Use: No   OB History    Gravida Para Term Preterm AB TAB SAB Ectopic Multiple Living   '4 3   1 1    3     ' Review of Systems  Gastrointestinal: Positive for abdominal pain.  Musculoskeletal: Positive for back pain.    10 Systems reviewed and are negative for acute change except as noted in the HPI.   Allergies  Review of patient's allergies indicates no known allergies.  Home Medications  Prior to Admission medications   Medication Sig Start Date End Date Taking? Authorizing Provider  ABILIFY 10 MG tablet Take 10 mg by mouth daily. 05/20/15  Yes Historical Provider, MD  albuterol (PROVENTIL HFA;VENTOLIN HFA) 108 (90 BASE) MCG/ACT inhaler Inhale 2 puffs into the lungs every 6 (six) hours as needed. For shortness of breath.   Yes Historical Provider, MD  Blood Glucose Monitoring Suppl (ACCU-CHEK AVIVA PLUS) W/DEVICE KIT 1 Device by Does not apply route once. 06/10/14  Yes Renato Shin, MD  buPROPion (WELLBUTRIN XL) 300 MG 24 hr tablet Take 300 mg by mouth every morning. 05/14/15  Yes Historical Provider, MD  gabapentin (NEURONTIN) 300 MG capsule Take 300 mg by mouth 3 (three) times daily.   Yes Historical Provider, MD  glucose blood (ACCU-CHEK AVIVA) test strip 1 each by Other route 2 (two) times daily. And lancets 2/day 250.43 06/10/14  Yes Renato Shin, MD   HYDROcodone-acetaminophen (NORCO/VICODIN) 5-325 MG per tablet Take 1 tablet by mouth every 6 (six) hours as needed for moderate pain. 03/09/15  Yes Dalia Heading, PA-C  hydrOXYzine (ATARAX/VISTARIL) 25 MG tablet Take 1 tablet (25 mg total) by mouth every 6 (six) hours as needed for itching. 10/01/14  Yes Kristen N Ward, DO  hydrOXYzine (ATARAX/VISTARIL) 50 MG tablet Take 50 mg by mouth at bedtime. 05/18/15  Yes Historical Provider, MD  insulin aspart (NOVOLOG) 100 UNIT/ML injection Inject 18 Units into the skin 3 (three) times daily before meals.    Yes Historical Provider, MD  insulin detemir (LEVEMIR) 100 UNIT/ML injection Inject 50 Units into the skin at bedtime.    Yes Historical Provider, MD  omeprazole (PRILOSEC) 20 MG capsule Take 20 mg by mouth daily as needed (heartburn).    Yes Historical Provider, MD  polyethylene glycol (MIRALAX / GLYCOLAX) packet Take 17 g by mouth at bedtime as needed. 05/14/15  Yes Historical Provider, MD  spironolactone (ALDACTONE) 50 MG tablet Take 50 mg by mouth daily.   Yes Historical Provider, MD  traMADol (ULTRAM) 50 MG tablet Take 50-100 mg by mouth every 6 (six) hours as needed for moderate pain or severe pain.  05/25/15  Yes Historical Provider, MD  traZODone (DESYREL) 100 MG tablet Take 100 mg by mouth at bedtime. 05/18/15  Yes Historical Provider, MD  zolpidem (AMBIEN) 10 MG tablet Take 10 mg by mouth at bedtime as needed for sleep.   Yes Historical Provider, MD  hydrocortisone cream 1 % Apply to affected area 2 times daily Patient not taking: Reported on 06/18/2015 10/01/14   Kristen N Ward, DO  ibuprofen (ADVIL,MOTRIN) 800 MG tablet Take 1 tablet (800 mg total) by mouth every 8 (eight) hours as needed. Patient not taking: Reported on 06/18/2015 03/09/15   Dalia Heading, PA-C  oxyCODONE-acetaminophen (PERCOCET) 7.5-325 MG per tablet Take 1 tablet by mouth every 8 (eight) hours as needed for pain. Patient not taking: Reported on 06/18/2015 09/16/14   Myeong O  Sheard, DPM  predniSONE (DELTASONE) 20 MG tablet Take 3 tablets (60 mg total) by mouth daily. Take 60 mg a day (3 tablets) for 3 days then 40 mg a day (2 tablets) for 3 days then 20 mg a day (one tablet) for 3 days then take 10 mg a day (1/2 tablet) for 4 days and then stop Patient not taking: Reported on 06/18/2015 10/01/14   Kristen N Ward, DO   BP 135/72 mmHg  Pulse 82  Temp(Src) 97.8 F (36.6 C) (Oral)  Resp 20  SpO2 100% Physical  Exam  Constitutional: She is oriented to person, place, and time. She appears well-developed and well-nourished.  In mild distress due to pain  HENT:  Head: Normocephalic and atraumatic.  Moist mucous membranes  Eyes: Conjunctivae are normal. Pupils are equal, round, and reactive to light.  Neck: Neck supple.  Cardiovascular: Normal rate, regular rhythm and normal heart sounds.   No murmur heard. Pulmonary/Chest: Effort normal and breath sounds normal.  Occasional scattered wheezes  Abdominal: Soft. Bowel sounds are normal. She exhibits no distension.  RUQ and midepigastric tenderness w/ no rebound or guarding  Musculoskeletal: She exhibits no edema.  Neurological: She is alert and oriented to person, place, and time.  Fluent speech  Skin: Skin is warm and dry.  Psychiatric: Judgment normal.  distressed  Nursing note and vitals reviewed.   ED Course  Procedures (including critical care time) Labs Review Labs Reviewed  URINALYSIS, ROUTINE W REFLEX MICROSCOPIC (NOT AT [facility]) - Abnormal; Notable for the following:    Glucose, UA >1000 (*)    Ketones, ur 40 (*)    All other components within normal limits  CBC WITH DIFFERENTIAL/PLATELET - Abnormal; Notable for the following:    RBC 5.36 (*)    MCV 75.7 (*)    Platelets 101 (*)    All other components within normal limits  COMPREHENSIVE METABOLIC PANEL - Abnormal; Notable for the following:    Potassium 3.3 (*)    Chloride 97 (*)    Glucose, Bld 345 (*)    All other components within normal  limits  URINE MICROSCOPIC-ADD ON - Abnormal; Notable for the following:    Squamous Epithelial / LPF MANY (*)    Bacteria, UA FEW (*)    All other components within normal limits  CBG MONITORING, ED - Abnormal; Notable for the following:    Glucose-Capillary 286 (*)    All other components within normal limits  LIPASE, BLOOD  TROPONIN I  CBC WITH DIFFERENTIAL/PLATELET    Imaging Review Dg Chest 2 View  06/18/2015   CLINICAL DATA:  Chest burning for 2 weeks.  Prior history of TB.  EXAM: CHEST  2 VIEW  COMPARISON:  07/30/2012  FINDINGS: Cardiomediastinal silhouette is normal. Mediastinal contours appear intact. Aorta is torturous.  There is no evidence of focal airspace consolidation, pleural effusion or pneumothorax. Small calcification in the periphery of the left lung is again noted, likely representing calcified granuloma.  Osseous structures are without acute abnormality. Soft tissues are grossly normal.  IMPRESSION: No radiographic evidence of acute cardiopulmonary abnormality.   Electronically Signed   By: Fidela Salisbury M.D.   On: 06/18/2015 13:09   US Abdomen Complete  06/18/2015   CLINICAL DATA:  Five day history of right upper quadrant and epigastric abdominal pain. Current history of hepatitis-C and diabetes.  EXAM: ULTRASOUND ABDOMEN COMPLETE  COMPARISON:  CT abdomen and pelvis 05/21/2011. Abdominal ultrasound 2/8/1,011.  FINDINGS: Gallbladder: Numerous shadowing gallstones. No gallbladder wall thickening or pericholecystic fluid. Negative sonographic Murphy sign according to the ultrasound technologist, though the patient did receive pain medication prior to the examination.  Common bile duct:  Diameter: Approximately 5 mm.  Liver: Diffusely increased and coarsened echotexture without focal hepatic parenchymal abnormality. Patent portal vein with hepatopetal flow.  IVC: Patent.  Pancreas: While difficult to visualize in its entirety, visualized portions normal in appearance.  Spleen:  Normal size and echotexture without focal parenchymal abnormality.  Right Kidney: Length: Approximately 11.6 cm. No hydronephrosis. Well-preserved cortex. No shadowing calculi. Normal parenchymal  echotexture. No focal parenchymal abnormality.  Left Kidney: Length: Approximately 10.4 cm. No hydronephrosis. Well-preserved cortex. No shadowing calculi. Normal parenchymal echotexture. No focal parenchymal abnormality.  Abdominal aorta: Normal in caliber throughout its visualized course in the abdomen without evidence of significant atherosclerosis. Maximum diameter 2.5 cm.  Other findings: None.  IMPRESSION: 1. Cholelithiasis.  No sonographic evidence of acute cholecystitis. 2. Diffuse hepatic steatosis and/or hepatocellular disease. No focal hepatic parenchymal abnormality. 3. Otherwise normal examination.   Electronically Signed   By: Evangeline Dakin M.D.   On: 06/18/2015 14:15   I have personally reviewed and evaluated these  lab results as part of my medical decision-making.   EKG Interpretation   Date/Time:  Thursday June 18 2015 12:24:52 EDT Ventricular Rate:  75 PR Interval:  141 QRS Duration: 86 QT Interval:  384 QTC Calculation: 429 R Axis:   -30 Text Interpretation:  Sinus rhythm Left axis deviation Low voltage,  precordial leads Abnormal R-wave progression, late transition which is new  from previous Confirmed by Naman Spychalski MD, Keyshun Elpers [phone]) on 06/18/2015 12:28:55  PM     Medications  insulin detemir (LEVEMIR) injection 50 Units (not administered)  potassium chloride SA (K-DUR,KLOR-CON) CR tablet 40 mEq (not administered)  sodium chloride 0.9 % bolus 1,000 mL (0 mLs Intravenous Stopped 06/18/15 1400)  ondansetron (ZOFRAN) injection 4 mg (4 mg Intravenous Given 06/18/15 1244)  morphine 4 MG/ML injection 4 mg (4 mg Intravenous Given 06/18/15 1247)  pantoprazole (PROTONIX) injection 40 mg (40 mg Intravenous Given 06/18/15 1238)  sodium chloride 0.9 % bolus 1,000 mL (1,000 mLs Intravenous New  Bag/Given 06/18/15 1526)    MDM   Final diagnoses:  None   symptomatic cholelithiasis Hyperglycemia Abdominal pain GERD  59 year old female with multiple medical problems who presents with 2 weeks of heartburn and upper abdominal pain and several days of feeling like food gets stuck in her throat. Patient awake, alert, uncomfortable but nontoxic and in no acute distress. Vital signs unremarkable. Right upper quadrant and midepigastric tenderness on exam. Obtained above lab work to evaluate for biliary obstruction, liver pathology, or pancreatitis. Also obtained EKG, CXR, and troponin given reports of chest pain and medical comorbidities. Gave IVF, zofran, morphine, as well as protonix.   Obtained abdominal US to evaluate gallbladder. Ultrasound showed cholelithiasis without any evidence of cholecystitis. Chest x-ray unremarkable. EKG with abnormal R-wave progression through precordial leads but otherwise no ischemic changes.  Labwork notable for K 3.3, Glucose 345, AG 14, UA with glucose and some ketones. On further questioning, the patient states that she has not been taking her insulin because she has not been eating much. Gave the patient her home dose of Levemir as well as by mouth challenge with crackers and ginger ale. Also gave 54mq potassium orally. Her blood glucose has corrected with IV fluids and her bicarb is normal, which is reassuring against severe DKA. She has had no vomiting and I have educated her on the importance of continuing her insulin regimen at home. I have also emphasized the importance of close blood glucose monitoring today. She is otherwise well-appearing on reexamination and her pain is adequately controlled. Regarding her chest pain, the patient states that it feels like heartburn and the pain starts in her right upper quadrant/midepigastrium and radiates into her chest. Given that her pain has lasted for several weeks and her troponin is normal here, I feel that ACS is  very unlikely. Regarding her gallstones, I have instructed to follow-up with general surgery and I  have reviewed return precautions including any signs of cholecystitis. The patient and her family have voiced understanding of the follow-up plan as well as return precautions. Patient will be discharged after the completion of her 2nd L IVF and glucose recheck.  Sharlett Iles, MD 06/18/15 1600

## 2015-06-18 NOTE — ED Notes (Signed)
US at BS

## 2015-06-18 NOTE — ED Notes (Signed)
Lab canceled  CBC due to error, will redraw and reorder CBC.

## 2015-06-18 NOTE — ED Notes (Signed)
Pt states she feels as if when she eats there is something stuck in here throat, states she has not been able to eat anything due to stomach crampnig which is making her back hurt. Pt states its causing her not to be able to sleep.

## 2015-07-11 ENCOUNTER — Emergency Department (HOSPITAL_COMMUNITY)
Admission: EM | Admit: 2015-07-11 | Discharge: 2015-07-12 | Disposition: A | Discharge status: Home / Self Care | Payer: Medicaid Other | Attending: Emergency Medicine | Admitting: Emergency Medicine

## 2015-07-11 ENCOUNTER — Encounter (HOSPITAL_COMMUNITY): Payer: Self-pay

## 2015-07-11 DIAGNOSIS — I10 Essential (primary) hypertension: Secondary | ICD-10-CM | POA: Diagnosis not present

## 2015-07-11 DIAGNOSIS — Z008 Encounter for other general examination: Secondary | ICD-10-CM

## 2015-07-11 DIAGNOSIS — Z79899 Other long term (current) drug therapy: Secondary | ICD-10-CM | POA: Insufficient documentation

## 2015-07-11 DIAGNOSIS — Z862 Personal history of diseases of the blood and blood-forming organs and certain disorders involving the immune mechanism: Secondary | ICD-10-CM | POA: Insufficient documentation

## 2015-07-11 DIAGNOSIS — Z794 Long term (current) use of insulin: Secondary | ICD-10-CM | POA: Diagnosis not present

## 2015-07-11 DIAGNOSIS — M1990 Unspecified osteoarthritis, unspecified site: Secondary | ICD-10-CM | POA: Insufficient documentation

## 2015-07-11 DIAGNOSIS — R739 Hyperglycemia, unspecified: Secondary | ICD-10-CM

## 2015-07-11 DIAGNOSIS — G8929 Other chronic pain: Secondary | ICD-10-CM | POA: Insufficient documentation

## 2015-07-11 DIAGNOSIS — Z872 Personal history of diseases of the skin and subcutaneous tissue: Secondary | ICD-10-CM | POA: Insufficient documentation

## 2015-07-11 DIAGNOSIS — Z87891 Personal history of nicotine dependence: Secondary | ICD-10-CM | POA: Insufficient documentation

## 2015-07-11 DIAGNOSIS — E1165 Type 2 diabetes mellitus with hyperglycemia: Secondary | ICD-10-CM | POA: Insufficient documentation

## 2015-07-11 DIAGNOSIS — K219 Gastro-esophageal reflux disease without esophagitis: Secondary | ICD-10-CM | POA: Insufficient documentation

## 2015-07-11 DIAGNOSIS — F329 Major depressive disorder, single episode, unspecified: Secondary | ICD-10-CM | POA: Diagnosis not present

## 2015-07-11 LAB — CBC WITH DIFFERENTIAL/PLATELET
BASOS ABS: 0.1 10*3/uL (ref 0.0–0.1)
Basophils Relative: 1 %
Eosinophils Absolute: 0.1 10*3/uL (ref 0.0–0.7)
Eosinophils Relative: 2 %
HEMATOCRIT: 39.6 % (ref 36.0–46.0)
Hemoglobin: 13.4 g/dL (ref 12.0–15.0)
LYMPHS ABS: 2.4 10*3/uL (ref 0.7–4.0)
Lymphocytes Relative: 40 %
MCH: 26.3 pg (ref 26.0–34.0)
MCHC: 33.8 g/dL (ref 30.0–36.0)
MCV: 77.6 fL — AB (ref 78.0–100.0)
MONO ABS: 0.3 10*3/uL (ref 0.1–1.0)
MONOS PCT: 5 %
NEUTROS ABS: 3.2 10*3/uL (ref 1.7–7.7)
Neutrophils Relative %: 52 %
PLATELETS: 124 10*3/uL — AB (ref 150–400)
RBC: 5.1 MIL/uL (ref 3.87–5.11)
RDW: 14.9 % (ref 11.5–15.5)
WBC: 6.1 10*3/uL (ref 4.0–10.5)

## 2015-07-11 LAB — COMPREHENSIVE METABOLIC PANEL
ALBUMIN: 3.8 g/dL (ref 3.5–5.0)
ALT: 25 U/L (ref 14–54)
ANION GAP: 9 (ref 5–15)
AST: 29 U/L (ref 15–41)
Alkaline Phosphatase: 62 U/L (ref 38–126)
BILIRUBIN TOTAL: 0.5 mg/dL (ref 0.3–1.2)
BUN: 10 mg/dL (ref 6–20)
CO2: 20 mmol/L — ABNORMAL LOW (ref 22–32)
Calcium: 9 mg/dL (ref 8.9–10.3)
Chloride: 103 mmol/L (ref 101–111)
Creatinine, Ser: 1.12 mg/dL — ABNORMAL HIGH (ref 0.44–1.00)
GFR calc Af Amer: >60 mL/min (ref >60)
GFR, EST NON AFRICAN AMERICAN: 53 mL/min — AB (ref >60)
Glucose, Bld: 745 mg/dL (ref 65–99)
POTASSIUM: 4.4 mmol/L (ref 3.5–5.1)
Sodium: 132 mmol/L — ABNORMAL LOW (ref 135–145)
TOTAL PROTEIN: 7.2 g/dL (ref 6.5–8.1)

## 2015-07-11 LAB — SALICYLATE LEVEL: Salicylate Lvl: <4 mg/dL (ref 2.8–30.0)

## 2015-07-11 LAB — CBG MONITORING, ED: Glucose-Capillary: >600 mg/dL (ref 65–99)

## 2015-07-11 LAB — RAPID URINE DRUG SCREEN, HOSP PERFORMED
Amphetamines: NOT DETECTED
BENZODIAZEPINES: NOT DETECTED
Barbiturates: NOT DETECTED
COCAINE: POSITIVE — AB
Opiates: NOT DETECTED
Tetrahydrocannabinol: NOT DETECTED

## 2015-07-11 LAB — ETHANOL

## 2015-07-11 LAB — ACETAMINOPHEN LEVEL: Acetaminophen (Tylenol), Serum: <10 ug/mL — ABNORMAL LOW (ref 10–30)

## 2015-07-11 MED ORDER — LORAZEPAM 1 MG PO TABS: 0.0000 mg | ORAL_TABLET | Freq: Four times a day (QID) | ORAL | Status: DC

## 2015-07-11 MED ORDER — THIAMINE HCL 100 MG/ML IJ SOLN: 100.0000 mg | Freq: Every day | INTRAMUSCULAR | Status: DC

## 2015-07-11 MED ORDER — INSULIN ASPART 100 UNIT/ML ~~LOC~~ SOLN
12.0000 [IU] | Freq: Once | SUBCUTANEOUS | Status: AC
  Administered 2015-07-12: 12 [IU] via INTRAVENOUS
  Filled 2015-07-11: qty 1

## 2015-07-11 MED ORDER — SODIUM CHLORIDE 0.9 % IV BOLUS (SEPSIS)
1000.0000 mL | Freq: Once | INTRAVENOUS | Status: AC
Start: 2015-07-11 — End: 2015-07-11
  Administered 2015-07-11: 1000 mL via INTRAVENOUS

## 2015-07-11 MED ORDER — SODIUM CHLORIDE 0.9 % IV BOLUS (SEPSIS)
1000.0000 mL | Freq: Once | INTRAVENOUS | Status: AC
  Administered 2015-07-11: 1000 mL via INTRAVENOUS

## 2015-07-11 MED ORDER — LORAZEPAM 1 MG PO TABS: 0.0000 mg | ORAL_TABLET | Freq: Two times a day (BID) | ORAL | Status: DC

## 2015-07-11 MED ORDER — LORAZEPAM 2 MG/ML IJ SOLN
0.0000 mg | Freq: Four times a day (QID) | INTRAMUSCULAR | Status: DC
Start: 2015-07-11 — End: 2015-07-11

## 2015-07-11 MED ORDER — LORAZEPAM 2 MG/ML IJ SOLN: 0.0000 mg | Freq: Two times a day (BID) | INTRAMUSCULAR | Status: DC

## 2015-07-11 MED ORDER — VITAMIN B-1 100 MG PO TABS: 100.0000 mg | ORAL_TABLET | Freq: Every day | ORAL | Status: DC

## 2015-07-11 NOTE — ED Notes (Signed)
Pt awake and talking with her family member at the bedside.

## 2015-07-11 NOTE — ED Notes (Signed)
She is here to seek assistance in ceasing the use of alcohol and crack cocaine.  She states she drinks 3 "40's" per day and "I use crack from sunup to sundown".  She is in no distress.

## 2015-07-11 NOTE — ED Notes (Signed)
Patient is resting comfortably and will answer questions, but will sleep when undisturbed.

## 2015-07-11 NOTE — ED Provider Notes (Signed)
CSN: [id number]     Arrival date & time 07/11/15  1803 History   By signing my name below, I, Kara Kelley, attest that this documentation has been prepared under the direction and in the presence of Kara Carnes, PA-C Electronically Signed: Erling Kelley, ED Scribe. 07/11/2015. 8:32 PM.    Chief Complaint  Patient presents with  . Addiction Problem   The history is provided by the patient. No language interpreter was used.    HPI Comments: Kara Kelley is a 59 y.o. female with a h/o DM, hepatitis C, HTN, depression and ETOH abuse who presents to the Emergency Department for alcohol and crack cocaine detox. Pt states she has been using ETOH and crack cocaine for 33 years. She endorses that she uses crack cocaine all day and drinks approx 3 40oz drinks per day. Pt states she has done detox in the past, approximately 10 years ago at [facility]. She denies any SI, HI, hallucinations or other complaints.  She states [facility] and would like to go there for treatment, however she was told to come here for medical clearance first.  Patient denies physical complaints-- specifically no chest pain, SOB, N/V/D, fever, chills, sweats.  No changes in medical history.  VSS.  Past Medical History  Diagnosis Date  . Hepatitis C   . Diabetes mellitus   . Migraine   . Degenerative joint disease   . Chronic pain   . GERD (gastroesophageal reflux disease)   . Bunion   . Callus   . Corns and callosities   . Hypertension   . Substance abuse     alcoholism  . Anemia   . Arthritis   . Depression   . Hyperlipidemia    Past Surgical History  Procedure Laterality Date  . Foot surgery    . Hammertoe repair Left 06/18/2009    Lt #5  . Phalangectomy Left 06/18/2009    LT #1  . Cotton osteotomy w/ graft Left 06/18/2009  . Metatarsal osteotomy Left 06/18/2009    #5  . Nail matrixectomy Left 06/18/2009    LT #1  . Phalangectomy Right 01/30/2013    Rt #1  . Osteotomy Right 01/30/2013     Rt #5  . Excision of benign lesion Right 01/30/2013    Rt Plantar  . Cesarean section      x3  . Colonoscopy     Family History  Problem Relation Age of Onset  . Diabetes      mat great aunt  . Heart disease Mother   . Cirrhosis      mat great aunt  . Cirrhosis      mat great uncles x 2  . Colon cancer Neg Hx    Social History  Substance Use Topics  . Smoking status: Former Smoker -- 0.50 packs/day    Types: Cigarettes    Quit date: 03/10/2014  . Smokeless tobacco: Never Used  . Alcohol Use: No   OB History    Gravida Para Term Preterm AB TAB SAB Ectopic Multiple Living   _0 Review of Systems  Psychiatric/Behavioral: Negative for suicidal ideas and hallucinations.       Detox  All other systems reviewed and are negative.     Allergies  Review of patient's allergies indicates no known allergies.  Home Medications   Prior to Admission medications   Medication Sig Start Date End Date  Taking? Authorizing Provider  ABILIFY 10 MG tablet Take 10 mg by mouth daily. 05/20/15  Yes Historical Provider, MD  albuterol (PROVENTIL HFA;VENTOLIN HFA) 108 (90 BASE) MCG/ACT inhaler Inhale 2 puffs into the lungs every 6 (six) hours as needed. For shortness of breath.   Yes Historical Provider, MD  Blood Glucose Monitoring Suppl (ACCU-CHEK AVIVA PLUS) W/DEVICE KIT 1 Device by Does not apply route once. 06/10/14  Yes Renato Shin, MD  buPROPion (WELLBUTRIN XL) 300 MG 24 hr tablet Take 300 mg by mouth every morning. 05/14/15  Yes Historical Provider, MD  gabapentin (NEURONTIN) 300 MG capsule Take 300 mg by mouth 3 (three) times daily.   Yes Historical Provider, MD  glucose blood (ACCU-CHEK AVIVA) test strip 1 each by Other route 2 (two) times daily. And lancets 2/day 250.43 06/10/14  Yes Renato Shin, MD  hydrOXYzine (ATARAX/VISTARIL) 50 MG tablet Take 50 mg by mouth at bedtime. 05/18/15  Yes Historical Provider, MD  insulin aspart (NOVOLOG) 100 UNIT/ML injection Inject 18  Units into the skin 3 (three) times daily before meals.    Yes Historical Provider, MD  insulin detemir (LEVEMIR) 100 UNIT/ML injection Inject 50 Units into the skin at bedtime.    Yes Historical Provider, MD  omeprazole (PRILOSEC) 20 MG capsule Take 1 capsule (20 mg total) by mouth daily. 06/18/15  Yes Wenda Overland Little, MD  polyethylene glycol (MIRALAX / GLYCOLAX) packet Take 17 g by mouth at bedtime as needed. 05/14/15  Yes Historical Provider, MD  spironolactone (ALDACTONE) 50 MG tablet Take 50 mg by mouth daily.   Yes Historical Provider, MD  traMADol (ULTRAM) 50 MG tablet Take 50-100 mg by mouth every 6 (six) hours as needed for moderate pain or severe pain.  05/25/15  Yes Historical Provider, MD  traZODone (DESYREL) 100 MG tablet Take 100 mg by mouth at bedtime. 05/18/15  Yes Historical Provider, MD  zolpidem (AMBIEN) 10 MG tablet Take 10 mg by mouth at bedtime as needed for sleep.   Yes Historical Provider, MD  hydrOXYzine (ATARAX/VISTARIL) 25 MG tablet Take 1 tablet (25 mg total) by mouth every 6 (six) hours as needed for itching. Patient not taking: Reported on 07/11/2015 10/01/14   Kristen N Ward, DO  oxyCODONE-acetaminophen (PERCOCET) 5-325 MG per tablet Take 1 tablet by mouth every 4 (four) hours as needed for severe pain. Patient not taking: Reported on 07/11/2015 06/18/15   Sharlett Iles, MD  promethazine (PHENERGAN) 25 MG tablet Take 1 tablet (25 mg total) by mouth every 6 (six) hours as needed for nausea or vomiting. Patient not taking: Reported on 07/11/2015 06/18/15   Sharlett Iles, MD   Triage Vitals: BP 107/70 mmHg  Pulse 93  Temp(Src) 98.4 F (36.9 C) (Oral)  Resp 18  SpO2 98%  Physical Exam  Constitutional: She is oriented to person, place, and time. She appears well-developed and well-nourished. No distress.  HENT:  Head: Normocephalic and atraumatic.  Eyes: Conjunctivae and EOM are normal.  Neck: Neck supple. No tracheal deviation present.  Cardiovascular:  Normal rate.   Pulmonary/Chest: Effort normal. No respiratory distress.  Abdominal: Soft. Bowel sounds are normal. There is no tenderness. There is no guarding.  Musculoskeletal: Normal range of motion. She exhibits no edema.  Neurological: She is alert and oriented to person, place, and time. She displays no tremor. She displays no seizure activity. Gait normal.  Skin: Skin is warm and dry. She is not diaphoretic.  Psychiatric: She has a normal mood and affect. Her  behavior is normal. Thought content normal. She is not actively hallucinating. She expresses no suicidal plans and no homicidal plans.  Denies SI/HI/AVH  Nursing note and vitals reviewed.   ED Course  Procedures (including critical care time)  DIAGNOSTIC STUDIES: Oxygen Saturation is 98% on RA, normal by my interpretation.    Labs Review Labs Reviewed  CBC WITH DIFFERENTIAL/PLATELET - Abnormal; Notable for the following:    MCV 77.6 (*)    Platelets 124 (*)    All other components within normal limits  COMPREHENSIVE METABOLIC PANEL - Abnormal; Notable for the following:    Sodium 132 (*)    CO2 20 (*)    Glucose, Bld 745 (*)    Creatinine, Ser 1.12 (*)    GFR calc non Af Amer 53 (*)    All other components within normal limits  URINE RAPID DRUG SCREEN, HOSP PERFORMED - Abnormal; Notable for the following:    Cocaine POSITIVE (*)    All other components within normal limits  ACETAMINOPHEN LEVEL - Abnormal; Notable for the following:    Acetaminophen (Tylenol), Serum <10 (*)    All other components within normal limits  CBG MONITORING, ED - Abnormal; Notable for the following:    Glucose-Capillary >600 (*)    All other components within normal limits  CBG MONITORING, ED - Abnormal; Notable for the following:    Glucose-Capillary 259 (*)    All other components within normal limits  ETHANOL  SALICYLATE LEVEL    Imaging Review No results found. I have personally reviewed and evaluated these images and lab  results as part of my medical decision-making.   EKG Interpretation None      MDM   Final diagnoses:  Medical clearance for psychiatric admission  Hyperglycemia   59 year old female here requesting detox from alcohol and crack cocaine. She has been using these substances for the past 33 years. No SI/HI/AVH.  Requesting to go to [facility]-- daughter has called and stated bed will be ready on Monday, needs medical clearance labs which were drawn and reveal hyperglycemia of 745.  Anion gap remains WNL, bicarb slightly low at 20 however i do not feel this clinically represents DKA. On chart review, it appears her DM has been poorly controlled (likely some component of medication non-compliance).  Patient was fluid resuscitated with 2L IVF and IV insulin with improvement of glucose to 259.  Patient remains without any complaints at this time.  Continues tolerating PO without difficulty.  VSS.  Patient stable for discharge.  Given copies of lab work to begin treatment at [facility].  FU with PCP in interim if any problems occur.  Discussed plan with patient, he/she acknowledged understanding and agreed with plan of care.  Return precautions given for new or worsening symptoms.  I personally performed the services described in this documentation, which was scribed in my presence. The recorded information has been reviewed and is accurate.  Larene Pickett, PA-C 07/12/15 0154  Virgel Manifold, MD 07/16/15 [phone]

## 2015-07-12 LAB — CBG MONITORING, ED: GLUCOSE-CAPILLARY: 259 mg/dL — AB (ref 65–99)

## 2015-07-12 NOTE — Discharge Instructions (Signed)
Lab work attached on back. Go to [facility] when bed ready. Make sure to keep close eye on your blood sugar at home. Return here for new concerns.

## 2015-07-22 ENCOUNTER — Ambulatory Visit: Payer: Medicaid Other | Admitting: Podiatry

## 2015-08-25 ENCOUNTER — Emergency Department (HOSPITAL_BASED_OUTPATIENT_CLINIC_OR_DEPARTMENT_OTHER)
Admission: EM | Admit: 2015-08-25 | Discharge: 2015-08-25 | Disposition: A | Discharge status: Home / Self Care | Payer: Medicaid Other | Attending: Emergency Medicine | Admitting: Emergency Medicine

## 2015-08-25 ENCOUNTER — Encounter (HOSPITAL_BASED_OUTPATIENT_CLINIC_OR_DEPARTMENT_OTHER): Payer: Self-pay

## 2015-08-25 ENCOUNTER — Emergency Department (HOSPITAL_BASED_OUTPATIENT_CLINIC_OR_DEPARTMENT_OTHER): Payer: Medicaid Other

## 2015-08-25 DIAGNOSIS — Z79899 Other long term (current) drug therapy: Secondary | ICD-10-CM | POA: Diagnosis not present

## 2015-08-25 DIAGNOSIS — E119 Type 2 diabetes mellitus without complications: Secondary | ICD-10-CM | POA: Insufficient documentation

## 2015-08-25 DIAGNOSIS — Z8619 Personal history of other infectious and parasitic diseases: Secondary | ICD-10-CM | POA: Diagnosis not present

## 2015-08-25 DIAGNOSIS — Z862 Personal history of diseases of the blood and blood-forming organs and certain disorders involving the immune mechanism: Secondary | ICD-10-CM | POA: Diagnosis not present

## 2015-08-25 DIAGNOSIS — Z872 Personal history of diseases of the skin and subcutaneous tissue: Secondary | ICD-10-CM | POA: Insufficient documentation

## 2015-08-25 DIAGNOSIS — M5441 Lumbago with sciatica, right side: Secondary | ICD-10-CM

## 2015-08-25 DIAGNOSIS — G43909 Migraine, unspecified, not intractable, without status migrainosus: Secondary | ICD-10-CM | POA: Diagnosis not present

## 2015-08-25 DIAGNOSIS — Z87891 Personal history of nicotine dependence: Secondary | ICD-10-CM | POA: Insufficient documentation

## 2015-08-25 DIAGNOSIS — G8929 Other chronic pain: Secondary | ICD-10-CM | POA: Insufficient documentation

## 2015-08-25 DIAGNOSIS — F329 Major depressive disorder, single episode, unspecified: Secondary | ICD-10-CM | POA: Insufficient documentation

## 2015-08-25 DIAGNOSIS — M549 Dorsalgia, unspecified: Secondary | ICD-10-CM | POA: Diagnosis present

## 2015-08-25 DIAGNOSIS — I10 Essential (primary) hypertension: Secondary | ICD-10-CM | POA: Diagnosis not present

## 2015-08-25 DIAGNOSIS — K219 Gastro-esophageal reflux disease without esophagitis: Secondary | ICD-10-CM | POA: Insufficient documentation

## 2015-08-25 DIAGNOSIS — Z794 Long term (current) use of insulin: Secondary | ICD-10-CM | POA: Diagnosis not present

## 2015-08-25 HISTORY — DX: Cocaine abuse, uncomplicated: F14.10

## 2015-08-25 HISTORY — DX: Alcohol abuse, uncomplicated: F10.10

## 2015-08-25 HISTORY — DX: Calculus of gallbladder without cholecystitis without obstruction: K80.20

## 2015-08-25 MED ORDER — NAPROXEN 500 MG PO TABS: 500.0000 mg | ORAL_TABLET | Freq: Two times a day (BID) | ORAL | Status: DC

## 2015-08-25 MED ORDER — DEXAMETHASONE SODIUM PHOSPHATE 10 MG/ML IJ SOLN
10.0000 mg | Freq: Once | INTRAMUSCULAR | Status: AC
  Administered 2015-08-25: 10 mg via INTRAVENOUS
  Filled 2015-08-25: qty 1

## 2015-08-25 MED ORDER — KETOROLAC TROMETHAMINE 60 MG/2ML IM SOLN
60.0000 mg | Freq: Once | INTRAMUSCULAR | Status: AC
  Administered 2015-08-25: 60 mg via INTRAMUSCULAR
  Filled 2015-08-25: qty 2

## 2015-08-25 NOTE — ED Notes (Signed)
C/o lower back, right hip and leg pain-denies injury-hx of chronic back pain-pt at [facility] for ETOH abuse,crack use

## 2015-08-25 NOTE — ED Provider Notes (Signed)
CSN: [id number]     Arrival date & time 08/25/15  1541 History   First MD Initiated Contact with Patient 08/25/15 1606     Chief Complaint  Patient presents with  . Back Pain     (Consider location/radiation/quality/duration/timing/severity/associated sxs/prior Treatment) Patient is a 59 y.o. female presenting with back pain. The history is provided by the patient.  Back Pain Location:  Generalized Quality:  Stabbing and shooting Radiates to:  R thigh, R foot and R knee Pain severity:  Moderate Pain is:  Same all the time Onset quality:  Gradual Duration:  3 days Timing:  Constant Progression:  Worsening Chronicity:  Chronic Context: not falling, not occupational injury and not recent injury   Relieved by:  Nothing Worsened by:  Ambulation, bending and twisting Ineffective treatments:  NSAIDs Associated symptoms: leg pain   Associated symptoms: no abdominal pain, no bladder incontinence, no bowel incontinence, no dysuria, no fever, no numbness, no paresthesias, no pelvic pain, no perianal numbness, no tingling, no weakness and no weight loss     Past Medical History  Diagnosis Date  . Hepatitis C   . Diabetes mellitus   . Migraine   . Degenerative joint disease   . Chronic pain   . GERD (gastroesophageal reflux disease)   . Bunion   . Callus   . Corns and callosities   . Hypertension   . Substance abuse     alcoholism  . Anemia   . Arthritis   . Depression   . Hyperlipidemia   . ETOH abuse   . Cocaine abuse   . Gall stones    Past Surgical History  Procedure Laterality Date  . Foot surgery    . Hammertoe repair Left 06/18/2009    Lt #5  . Phalangectomy Left 06/18/2009    LT #1  . Cotton osteotomy w/ graft Left 06/18/2009  . Metatarsal osteotomy Left 06/18/2009    #5  . Nail matrixectomy Left 06/18/2009    LT #1  . Phalangectomy Right 01/30/2013    Rt #1  . Osteotomy Right 01/30/2013    Rt #5  . Excision of benign lesion Right 01/30/2013    Rt Plantar  .  Cesarean section      x3  . Colonoscopy     Family History  Problem Relation Age of Onset  . Diabetes      mat great aunt  . Heart disease Mother   . Cirrhosis      mat great aunt  . Cirrhosis      mat great uncles x 2  . Colon cancer Neg Hx    Social History  Substance Use Topics  . Smoking status: Former Smoker -- 0.50 packs/day    Types: Cigarettes    Quit date: 03/10/2014  . Smokeless tobacco: Never Used  . Alcohol Use: No     Comment: in rehab   OB History    Gravida Para Term Preterm AB TAB SAB Ectopic Multiple Living   '4 3   1 1    3     ' Review of Systems  Constitutional: Negative for fever and weight loss.  Gastrointestinal: Negative for abdominal pain and bowel incontinence.  Genitourinary: Negative for bladder incontinence, dysuria and pelvic pain.  Musculoskeletal: Positive for back pain.  Neurological: Negative for tingling, weakness, numbness and paresthesias.      Allergies  Review of patient's allergies indicates no known allergies.  Home Medications   Prior to Admission medications  Medication Sig Start Date End Date Taking? Authorizing Provider  ABILIFY 10 MG tablet Take 10 mg by mouth daily. 05/20/15   Historical Provider, MD  albuterol (PROVENTIL HFA;VENTOLIN HFA) 108 (90 BASE) MCG/ACT inhaler Inhale 2 puffs into the lungs every 6 (six) hours as needed. For shortness of breath.    Historical Provider, MD  Blood Glucose Monitoring Suppl (ACCU-CHEK AVIVA PLUS) W/DEVICE KIT 1 Device by Does not apply route once. 06/10/14   Renato Shin, MD  buPROPion (WELLBUTRIN XL) 300 MG 24 hr tablet Take 300 mg by mouth every morning. 05/14/15   Historical Provider, MD  gabapentin (NEURONTIN) 300 MG capsule Take 300 mg by mouth 3 (three) times daily.    Historical Provider, MD  glucose blood (ACCU-CHEK AVIVA) test strip 1 each by Other route 2 (two) times daily. And lancets 2/day 250.43 06/10/14   Renato Shin, MD  hydrOXYzine (ATARAX/VISTARIL) 25 MG tablet Take 1  tablet (25 mg total) by mouth every 6 (six) hours as needed for itching. Patient not taking: Reported on 07/11/2015 10/01/14   Kristen N Ward, DO  hydrOXYzine (ATARAX/VISTARIL) 50 MG tablet Take 50 mg by mouth at bedtime. 05/18/15   Historical Provider, MD  insulin aspart (NOVOLOG) 100 UNIT/ML injection Inject 18 Units into the skin 3 (three) times daily before meals.     Historical Provider, MD  insulin detemir (LEVEMIR) 100 UNIT/ML injection Inject 50 Units into the skin at bedtime.     Historical Provider, MD  naproxen (NAPROSYN) 500 MG tablet Take 1 tablet (500 mg total) by mouth 2 (two) times daily. 08/25/15   Gloriann Loan, PA-C  omeprazole (PRILOSEC) 20 MG capsule Take 1 capsule (20 mg total) by mouth daily. 06/18/15   Sharlett Iles, MD  polyethylene glycol (MIRALAX / GLYCOLAX) packet Take 17 g by mouth at bedtime as needed. 05/14/15   Historical Provider, MD  spironolactone (ALDACTONE) 50 MG tablet Take 50 mg by mouth daily.    Historical Provider, MD  traZODone (DESYREL) 100 MG tablet Take 100 mg by mouth at bedtime. 05/18/15   Historical Provider, MD   BP 128/75 mmHg  Pulse 74  Temp(Src) 98.2 F (36.8 C) (Oral)  Resp 18  Ht 5' (1.524 m)  Wt 209 lb (94.802 kg)  BMI 40.82 kg/m2  SpO2 100% Physical Exam  Constitutional: She is oriented to person, place, and time. She appears well-developed and well-nourished.  HENT:  Head: Normocephalic and atraumatic.  Mouth/Throat: Oropharynx is clear and moist.  Eyes: Conjunctivae are normal. Pupils are equal, round, and reactive to light.  Neck: Normal range of motion. Neck supple.  Cardiovascular: Normal rate, regular rhythm, normal heart sounds and intact distal pulses.   No murmur heard. Pulses:      Posterior tibial pulses are 2+ on the right side, and 2+ on the left side.  Pulmonary/Chest: Effort normal and breath sounds normal. No accessory muscle usage or stridor. No respiratory distress. She has no wheezes. She has no rhonchi. She has no  rales.  Abdominal: Soft. Bowel sounds are normal. She exhibits no distension. There is no tenderness.  Musculoskeletal:       Lumbar back: She exhibits decreased range of motion, tenderness and pain. She exhibits no deformity.       Back:       Legs: No spinous process tenderness.  No step offs.  Lymphadenopathy:    She has no cervical adenopathy.  Neurological: She is alert and oriented to person, place, and time.  Speech  clear without dysarthria.  Strength and sensation intact bilaterally throughout upper and lower extremities.  No saddle anesthesia.  Skin: Skin is warm and dry.  Psychiatric: She has a normal mood and affect. Her behavior is normal.    ED Course  Procedures (including critical care time) Labs Review Labs Reviewed - No data to display  Imaging Review Dg Lumbar Spine Complete  08/25/2015  CLINICAL DATA:  HX: Chronic L-spine pain but patient complains pain worsened over the past 3 days. Pt states she fell x 1 month ago. EXAM: LUMBAR SPINE - COMPLETE 4+ VIEW COMPARISON:  03/09/2015 FINDINGS: Vertebral body height maintained throughout. Negative for fracture. Mild narrowing of the L4-5 interspace with early grade 1 anterolisthesis. There is facet DJD most marked bilaterally L4-5. No pars defect. Patchy aortic calcifications. IMPRESSION: 1. Negative for fracture. 2. L4-5 grade 1 anterolisthesis probably secondary to facet DJD ; no pars defect. Electronically Signed   By: Lucrezia Europe M.D.   On: 08/25/2015 18:01   I have personally reviewed and evaluated these images and lab results as part of my medical decision-making.   EKG Interpretation None      MDM   Final diagnoses:  Bilateral low back pain with right-sided sciatica    Patient presents with low back pain with sciatica.  VSS.  No injury/trauma.  No red flags.  No neurological deficits.  Distal pulses intact.  No gait abnormalities.  Suspect acute on chronic low back pain with sciatica. Doubt cauda equina.   Doubt infectious process.  Doubt AAA.  Hx of distant fall.  Will obtain plain films.  Will give decadron and toradol for pain control.  Discussed return precautions to the ED. Follow up with PCP.     Gloriann Loan, PA-C 08/25/15 Vernelle Emerald  Gareth Morgan, MD 08/26/15 2114

## 2015-08-25 NOTE — ED Notes (Signed)
PA at bedside.

## 2015-08-25 NOTE — Discharge Instructions (Signed)

## 2015-08-25 NOTE — ED Notes (Signed)
[facility] non[facility]arcotic pain medication- EDPA Cheri FowlerKayla Rose notified

## 2015-09-05 ENCOUNTER — Encounter (HOSPITAL_BASED_OUTPATIENT_CLINIC_OR_DEPARTMENT_OTHER): Payer: Self-pay

## 2015-09-05 ENCOUNTER — Emergency Department (HOSPITAL_BASED_OUTPATIENT_CLINIC_OR_DEPARTMENT_OTHER)
Admission: EM | Admit: 2015-09-05 | Discharge: 2015-09-05 | Disposition: A | Discharge status: Home / Self Care | Payer: Medicaid Other | Attending: Emergency Medicine | Admitting: Emergency Medicine

## 2015-09-05 DIAGNOSIS — I10 Essential (primary) hypertension: Secondary | ICD-10-CM | POA: Insufficient documentation

## 2015-09-05 DIAGNOSIS — R350 Frequency of micturition: Secondary | ICD-10-CM | POA: Diagnosis not present

## 2015-09-05 DIAGNOSIS — Z794 Long term (current) use of insulin: Secondary | ICD-10-CM | POA: Insufficient documentation

## 2015-09-05 DIAGNOSIS — Z87891 Personal history of nicotine dependence: Secondary | ICD-10-CM | POA: Insufficient documentation

## 2015-09-05 DIAGNOSIS — G43909 Migraine, unspecified, not intractable, without status migrainosus: Secondary | ICD-10-CM | POA: Diagnosis not present

## 2015-09-05 DIAGNOSIS — Z872 Personal history of diseases of the skin and subcutaneous tissue: Secondary | ICD-10-CM | POA: Insufficient documentation

## 2015-09-05 DIAGNOSIS — G8929 Other chronic pain: Secondary | ICD-10-CM | POA: Insufficient documentation

## 2015-09-05 DIAGNOSIS — M5441 Lumbago with sciatica, right side: Secondary | ICD-10-CM | POA: Diagnosis not present

## 2015-09-05 DIAGNOSIS — F329 Major depressive disorder, single episode, unspecified: Secondary | ICD-10-CM | POA: Insufficient documentation

## 2015-09-05 DIAGNOSIS — Z791 Long term (current) use of non-steroidal anti-inflammatories (NSAID): Secondary | ICD-10-CM | POA: Insufficient documentation

## 2015-09-05 DIAGNOSIS — K219 Gastro-esophageal reflux disease without esophagitis: Secondary | ICD-10-CM | POA: Insufficient documentation

## 2015-09-05 DIAGNOSIS — M545 Low back pain: Secondary | ICD-10-CM | POA: Diagnosis present

## 2015-09-05 DIAGNOSIS — Z862 Personal history of diseases of the blood and blood-forming organs and certain disorders involving the immune mechanism: Secondary | ICD-10-CM | POA: Insufficient documentation

## 2015-09-05 DIAGNOSIS — M1990 Unspecified osteoarthritis, unspecified site: Secondary | ICD-10-CM | POA: Diagnosis not present

## 2015-09-05 DIAGNOSIS — Z79899 Other long term (current) drug therapy: Secondary | ICD-10-CM | POA: Diagnosis not present

## 2015-09-05 DIAGNOSIS — E119 Type 2 diabetes mellitus without complications: Secondary | ICD-10-CM | POA: Diagnosis not present

## 2015-09-05 LAB — CBG MONITORING, ED: GLUCOSE-CAPILLARY: 75 mg/dL (ref 65–99)

## 2015-09-05 LAB — URINALYSIS, ROUTINE W REFLEX MICROSCOPIC
Bilirubin Urine: NEGATIVE
Glucose, UA: NEGATIVE mg/dL
Hgb urine dipstick: NEGATIVE
Ketones, ur: NEGATIVE mg/dL
LEUKOCYTES UA: NEGATIVE
Nitrite: NEGATIVE
PROTEIN: NEGATIVE mg/dL
SPECIFIC GRAVITY, URINE: 1.013 (ref 1.005–1.030)
pH: 5.5 (ref 5.0–8.0)

## 2015-09-05 MED ORDER — KETOROLAC TROMETHAMINE 60 MG/2ML IM SOLN
60.0000 mg | Freq: Once | INTRAMUSCULAR | Status: AC
  Administered 2015-09-05: 60 mg via INTRAMUSCULAR
  Filled 2015-09-05: qty 2

## 2015-09-05 MED ORDER — DEXAMETHASONE 4 MG PO TABS
10.0000 mg | ORAL_TABLET | Freq: Once | ORAL | Status: AC
  Administered 2015-09-05: 10 mg via ORAL

## 2015-09-05 MED ORDER — DEXAMETHASONE 4 MG PO TABS
ORAL_TABLET | ORAL | Status: AC
  Administered 2015-09-05: 10 mg via ORAL
  Filled 2015-09-05: qty 3

## 2015-09-05 MED ORDER — CYCLOBENZAPRINE HCL 10 MG PO TABS: 10.0000 mg | ORAL_TABLET | Freq: Two times a day (BID) | ORAL | Status: DC | PRN

## 2015-09-05 MED ORDER — CYCLOBENZAPRINE HCL 10 MG PO TABS
10.0000 mg | ORAL_TABLET | Freq: Once | ORAL | Status: AC
  Administered 2015-09-05: 10 mg via ORAL
  Filled 2015-09-05: qty 1

## 2015-09-05 NOTE — ED Provider Notes (Signed)
CSN: [id number]     Arrival date & time 09/05/15  0263 History   First MD Initiated Contact with Patient 09/05/15 [phone]     Chief Complaint  Patient presents with  . Back Pain     (Consider location/radiation/quality/duration/timing/severity/associated sxs/prior Treatment) HPI Comments: Years of back pain off and on Steroid shot last time worked, wore off Had episode 10 days ago, improved Then came back a few days ago Across lower back down right hip and down leg Shooting pain, severe 10/10 Constant  [facility] for 2 more weeks (will be able to go to PCP)-was going to pain management, can't take narcotics now in [facility] No abd pain/dysuria +frequency No fevers, no hx of IVDU, steroid use No loss control of bowel or bladder, no recent falls    Patient is a 59 y.o. female presenting with back pain.  Back Pain Associated symptoms: no abdominal pain, no chest pain, no dysuria, no fever, no headaches, no numbness and no weakness     Past Medical History  Diagnosis Date  . Hepatitis C   . Diabetes mellitus   . Migraine   . Degenerative joint disease   . Chronic pain   . GERD (gastroesophageal reflux disease)   . Bunion   . Callus   . Corns and callosities   . Hypertension   . Substance abuse     alcoholism  . Anemia   . Arthritis   . Depression   . Hyperlipidemia   . ETOH abuse   . Cocaine abuse   . Gall stones    Past Surgical History  Procedure Laterality Date  . Foot surgery    . Hammertoe repair Left 06/18/2009    Lt #5  . Phalangectomy Left 06/18/2009    LT #1  . Cotton osteotomy w/ graft Left 06/18/2009  . Metatarsal osteotomy Left 06/18/2009    #5  . Nail matrixectomy Left 06/18/2009    LT #1  . Phalangectomy Right 01/30/2013    Rt #1  . Osteotomy Right 01/30/2013    Rt #5  . Excision of benign lesion Right 01/30/2013    Rt Plantar  . Cesarean section      x3  . Colonoscopy     Family History  Problem Relation Age of Onset  . Diabetes      mat great aunt   . Heart disease Mother   . Cirrhosis      mat great aunt  . Cirrhosis      mat great uncles x 2  . Colon cancer Neg Hx    Social History  Substance Use Topics  . Smoking status: Former Smoker -- 0.50 packs/day    Types: Cigarettes    Quit date: 03/10/2014  . Smokeless tobacco: Never Used  . Alcohol Use: No     Comment: in rehab   OB History    Gravida Para Term Preterm AB TAB SAB Ectopic Multiple Living   '4 3   1 1    3     ' Review of Systems  Constitutional: Negative for fever.  HENT: Negative for sore throat.   Eyes: Negative for visual disturbance.  Respiratory: Negative for cough and shortness of breath.   Cardiovascular: Negative for chest pain.  Gastrointestinal: Negative for nausea, vomiting and abdominal pain.  Genitourinary: Positive for frequency. Negative for dysuria, vaginal bleeding, vaginal discharge and difficulty urinating.  Musculoskeletal: Positive for back pain. Negative for neck pain.  Skin: Negative for rash.  Neurological: Negative for  syncope, weakness, numbness and headaches.      Allergies  Review of patient's allergies indicates no known allergies.  Home Medications   Prior to Admission medications   Medication Sig Start Date End Date Taking? Authorizing Provider  ABILIFY 10 MG tablet Take 10 mg by mouth daily. 05/20/15   Historical Provider, MD  albuterol (PROVENTIL HFA;VENTOLIN HFA) 108 (90 BASE) MCG/ACT inhaler Inhale 2 puffs into the lungs every 6 (six) hours as needed. For shortness of breath.    Historical Provider, MD  Blood Glucose Monitoring Suppl (ACCU-CHEK AVIVA PLUS) W/DEVICE KIT 1 Device by Does not apply route once. 06/10/14   Renato Shin, MD  buPROPion (WELLBUTRIN XL) 300 MG 24 hr tablet Take 300 mg by mouth every morning. 05/14/15   Historical Provider, MD  gabapentin (NEURONTIN) 300 MG capsule Take 300 mg by mouth 3 (three) times daily.    Historical Provider, MD  glucose blood (ACCU-CHEK AVIVA) test strip 1 each by Other route 2  (two) times daily. And lancets 2/day 250.43 06/10/14   Renato Shin, MD  hydrOXYzine (ATARAX/VISTARIL) 25 MG tablet Take 1 tablet (25 mg total) by mouth every 6 (six) hours as needed for itching. Patient not taking: Reported on 07/11/2015 10/01/14   Kristen N Ward, DO  hydrOXYzine (ATARAX/VISTARIL) 50 MG tablet Take 50 mg by mouth at bedtime. 05/18/15   Historical Provider, MD  insulin aspart (NOVOLOG) 100 UNIT/ML injection Inject 18 Units into the skin 3 (three) times daily before meals.     Historical Provider, MD  insulin detemir (LEVEMIR) 100 UNIT/ML injection Inject 50 Units into the skin at bedtime.     Historical Provider, MD  naproxen (NAPROSYN) 500 MG tablet Take 1 tablet (500 mg total) by mouth 2 (two) times daily. 08/25/15   Gloriann Loan, PA-C  omeprazole (PRILOSEC) 20 MG capsule Take 1 capsule (20 mg total) by mouth daily. 06/18/15   Sharlett Iles, MD  polyethylene glycol (MIRALAX / GLYCOLAX) packet Take 17 g by mouth at bedtime as needed. 05/14/15   Historical Provider, MD  spironolactone (ALDACTONE) 50 MG tablet Take 50 mg by mouth daily.    Historical Provider, MD  traZODone (DESYREL) 100 MG tablet Take 100 mg by mouth at bedtime. 05/18/15   Historical Provider, MD   BP 122/82 mmHg  Pulse 80  Temp(Src) 98.7 F (37.1 C) (Oral)  Resp 20  Ht 5' (1.524 m)  Wt 209 lb (94.802 kg)  BMI 40.82 kg/m2  SpO2 100% Physical Exam  Constitutional: She is oriented to person, place, and time. She appears well-developed and well-nourished. No distress.  HENT:  Head: Normocephalic and atraumatic.  Eyes: Conjunctivae and EOM are normal.  Neck: Normal range of motion.  Cardiovascular: Normal rate, regular rhythm, normal heart sounds and intact distal pulses.  Exam reveals no gallop and no friction rub.   No murmur heard. Pulmonary/Chest: Effort normal and breath sounds normal. No respiratory distress. She has no wheezes. She has no rales.  Abdominal: Soft. She exhibits no distension. There is no  tenderness. There is no guarding.  Musculoskeletal: She exhibits no edema.       Cervical back: She exhibits no tenderness and no bony tenderness.       Thoracic back: She exhibits no tenderness and no bony tenderness.       Lumbar back: She exhibits tenderness and bony tenderness.  Neurological: She is alert and oriented to person, place, and time. She has normal strength. No sensory deficit. GCS eye subscore  is 4. GCS verbal subscore is 5. GCS motor subscore is 6.  5/5 bilateral lower ext hip flexion/extension knee flexion/extension, dorsi/plantar flexion, great toe extension  Skin: Skin is warm and dry. No rash noted. She is not diaphoretic. No erythema.  Nursing note and vitals reviewed.   ED Course  Procedures (including critical care time) Labs Review Labs Reviewed  URINALYSIS, ROUTINE W REFLEX MICROSCOPIC (NOT AT [facility])  CBG MONITORING, ED    Imaging Review No results found. I have personally reviewed and evaluated these images and lab results as part of my medical decision-making.   EKG Interpretation None      MDM   Final diagnoses:  None   59yo female with history of DM, hepatitis C, ETOH abuse currently at [facility], back pain, presents with concern for back pain.  Patient has a normal neurologic exam and denies any urinary retention or overflow incontinence, stool incontinence, saddle anesthesia, fever, IV drug use, trauma, chronic steroid use or immunocompromise and have low suspicion suspicion for cauda equina, fracture, epidural abscess, or vertebral osteomyelitis.  Pt with urinary frequency with urinalysis showing no signs of UTI, glucosuria or ketonuria. Pt reports steroids helped with BP during last visit, and was given po decadron x1 and rx for flexeril for back pain.  Patient discharged in stable condition with understanding of reasons to return.    Gareth Morgan, MD 09/05/15 [phone]

## 2015-09-05 NOTE — ED Notes (Signed)
Patient here with ongoing lower back pain x 10-12 days. Seen lat week for same and now complains of return of pain and urinary frequency

## 2015-09-05 NOTE — Discharge Instructions (Signed)
Back Exercises °The following exercises strengthen the muscles that help to support the back. They also help to keep the lower back flexible. Doing these exercises can help to prevent back pain or lessen existing pain. °If you have back pain or discomfort, try doing these exercises 2-3 times each day or as told by your health care provider. When the pain goes away, do them once each day, but increase the number of times that you repeat the steps for each exercise (do more repetitions). If you do not have back pain or discomfort, do these exercises once each day or as told by your health care provider. °EXERCISES °Single Knee to Chest °Repeat these steps 3-5 times for each leg: °· Lie on your back on a firm bed or the floor with your legs extended. °· Bring one knee to your chest. Your other leg should stay extended and in contact with the floor. °· Hold your knee in place by grabbing your knee or thigh. °· Pull on your knee until you feel a gentle stretch in your lower back. °· Hold the stretch for 10-30 seconds. °· Slowly release and straighten your leg. °Pelvic Tilt °Repeat these steps 5-10 times: °· Lie on your back on a firm bed or the floor with your legs extended. °· Bend your knees so they are pointing toward the ceiling and your feet are flat on the floor. °· Tighten your lower abdominal muscles to press your lower back against the floor. This motion will tilt your pelvis so your tailbone points up toward the ceiling instead of pointing to your feet or the floor. °· With gentle tension and even breathing, hold this position for 5-10 seconds. °Cat-Cow °Repeat these steps until your lower back becomes more flexible: °· Get into a hands-and-knees position on a firm surface. Keep your hands under your shoulders, and keep your knees under your hips. You may place padding under your knees for comfort. °· Let your head hang down, and point your tailbone toward the floor so your lower back becomes rounded like the  back of a cat. °· Hold this position for 5 seconds. °· Slowly lift your head and point your tailbone up toward the ceiling so your back forms a sagging arch like the back of a cow. °· Hold this position for 5 seconds. °Press-Ups °Repeat these steps 5-10 times: °1. Lie on your abdomen (face-down) on the floor. °2. Place your palms near your head, about shoulder-width apart. °3. While you keep your back as relaxed as possible and keep your hips on the floor, slowly straighten your arms to raise the top half of your body and lift your shoulders. Do not use your back muscles to raise your upper torso. You may adjust the placement of your hands to make yourself more comfortable. °4. Hold this position for 5 seconds while you keep your back relaxed. °5. Slowly return to lying flat on the floor. °Bridges °Repeat these steps 10 times: °1. Lie on your back on a firm surface. °2. Bend your knees so they are pointing toward the ceiling and your feet are flat on the floor. °3. Tighten your buttocks muscles and lift your buttocks off of the floor until your waist is at almost the same height as your knees. You should feel the muscles working in your buttocks and the back of your thighs. If you do not feel these muscles, slide your feet 1-2 inches farther away from your buttocks. °4. Hold this position for 3-5   seconds. °5. Slowly lower your hips to the starting position, and allow your buttocks muscles to relax completely. °If this exercise is too easy, try doing it with your arms crossed over your chest. °Abdominal Crunches °Repeat these steps 5-10 times: °1. Lie on your back on a firm bed or the floor with your legs extended. °2. Bend your knees so they are pointing toward the ceiling and your feet are flat on the floor. °3. Cross your arms over your chest. °4. Tip your chin slightly toward your chest without bending your neck. °5. Tighten your abdominal muscles and slowly raise your trunk (torso) high enough to lift your  shoulder blades a tiny bit off of the floor. Avoid raising your torso higher than that, because it can put too much stress on your low back and it does not help to strengthen your abdominal muscles. °6. Slowly return to your starting position. °Back Lifts °Repeat these steps 5-10 times: °1. Lie on your abdomen (face-down) with your arms at your sides, and rest your forehead on the floor. °2. Tighten the muscles in your legs and your buttocks. °3. Slowly lift your chest off of the floor while you keep your hips pressed to the floor. Keep the back of your head in line with the curve in your back. Your eyes should be looking at the floor. °4. Hold this position for 3-5 seconds. °5. Slowly return to your starting position. °SEEK MEDICAL CARE IF: °· Your back pain or discomfort gets much worse when you do an exercise. °· Your back pain or discomfort does not lessen within 2 hours after you exercise. °If you have any of these problems, stop doing these exercises right away. Do not do them again unless your health care provider says that you can. °SEEK IMMEDIATE MEDICAL CARE IF: °· You develop sudden, severe back pain. If this happens, stop doing the exercises right away. Do not do them again unless your health care provider says that you can. °  °This information is not intended to replace advice given to you by your health care provider. Make sure you discuss any questions you have with your health care provider. °  °Document Released: 11/03/2004 Document Revised: 06/17/2015 Document Reviewed: 11/20/2014 °Elsevier Interactive Patient Education ©2016 [employer] ° °Chronic Back Pain ° When back pain lasts longer than 3 months, it is called chronic back pain. People with chronic back pain often go through certain periods that are more intense (flare-ups).  °CAUSES °Chronic back pain can be caused by wear and tear (degeneration) on different structures in your back. These structures include: °· The bones of your spine  (vertebrae) and the joints surrounding your spinal cord and nerve roots (facets). °· The strong, fibrous tissues that connect your vertebrae (ligaments). °Degeneration of these structures may result in pressure on your nerves. This can lead to constant pain. °HOME CARE INSTRUCTIONS °· Avoid bending, heavy lifting, prolonged sitting, and activities which make the problem worse. °· Take brief periods of rest throughout the day to reduce your pain. Lying down or standing usually is better than sitting while you are resting. °· Take over-the-counter or prescription medicines only as directed by your caregiver. °SEEK IMMEDIATE MEDICAL CARE IF:  °· You have weakness or numbness in one of your legs or feet. °· You have trouble controlling your bladder or bowels. °· You have nausea, vomiting, abdominal pain, shortness of breath, or fainting. °  °This information is not intended to replace advice given to you by   your health care provider. Make sure you discuss any questions you have with your health care provider. °  °Document Released: 11/03/2004 Document Revised: 12/19/2011 Document Reviewed: 03/16/2015 °Elsevier Interactive Patient Education ©2016 [employer] ° °

## 2015-10-29 ENCOUNTER — Other Ambulatory Visit: Payer: Medicaid Other

## 2015-10-29 DIAGNOSIS — B182 Chronic viral hepatitis C: Secondary | ICD-10-CM

## 2015-10-29 LAB — IRON: Iron: 93 ug/dL (ref 45–160)

## 2015-10-30 LAB — HEPATITIS A ANTIBODY, TOTAL: Hep A Total Ab: REACTIVE — AB

## 2015-10-30 LAB — HEPATITIS B SURFACE ANTIGEN: Hepatitis B Surface Ag: NEGATIVE

## 2015-10-30 LAB — PROTIME-INR
INR: 0.99 (ref <1.50)
Prothrombin Time: 13.2 seconds (ref 11.6–15.2)

## 2015-10-30 LAB — HIV ANTIBODY (ROUTINE TESTING W REFLEX): HIV: NONREACTIVE

## 2015-10-30 LAB — HEPATITIS B SURFACE ANTIBODY,QUALITATIVE: Hep B S Ab: POSITIVE — AB

## 2015-10-30 LAB — HEPATITIS B CORE ANTIBODY, TOTAL: Hep B Core Total Ab: NONREACTIVE

## 2015-10-30 LAB — ANA: Anti Nuclear Antibody(ANA): NEGATIVE

## 2015-11-03 LAB — HCV RNA,LIPA RFLX NS5A DRUG RESIST

## 2015-11-25 ENCOUNTER — Encounter: Payer: Medicaid Other | Admitting: Neurology

## 2015-12-01 ENCOUNTER — Ambulatory Visit (INDEPENDENT_AMBULATORY_CARE_PROVIDER_SITE_OTHER): Payer: Medicaid Other | Admitting: Neurology

## 2015-12-01 ENCOUNTER — Ambulatory Visit (INDEPENDENT_AMBULATORY_CARE_PROVIDER_SITE_OTHER): Payer: Self-pay | Admitting: Neurology

## 2015-12-01 DIAGNOSIS — Z0289 Encounter for other administrative examinations: Secondary | ICD-10-CM

## 2015-12-01 DIAGNOSIS — M79602 Pain in left arm: Secondary | ICD-10-CM

## 2015-12-01 NOTE — Progress Notes (Signed)
  [facility]    Provider:  Dr Lucia Gaskins Referring Provider: Arnette Felts, FNP Primary Care Physician:  No primary care provider on file.  HPI:  Kara Kelley is a 60 y.o. female here as a referral from Dr. Christell Constant for left upper extremity numbness and tingling of the hand.   Summary: Nerve Conduction studies were performed on the bilateral upper extremities.  ADM Ulnar and APB Median motor conductions were within normal limits with normal F wave latencies.  2nd-digit Median, 5th-digit Ulnar and radial sensory conductions were within normal limits.   EMG needle evaluation was performed on selected left upper extremity muscles: The left Deltoid, left Triceps, left Biceps, left Flexor Digitorum Profundus (ulnar), left Pronator Teres, left First Dorsal Interoseous, left Opponens Pollicis and left C8T1, C7/C8, C6C7 paraspinal muscles were normal.  Conclusion: This is a normal study. No electrophysiologic evidence for median or ulnar neuropathy or cervical radiculopathy.   [name], MD  [facility] [address] [place], [place] [address]  Phone [phone] Fax [phone]

## 2015-12-01 NOTE — Progress Notes (Signed)
See procedure note.

## 2015-12-06 NOTE — Procedures (Signed)
  [facility]    Provider:  Dr Tavious Griesinger Referring Provider: Moore, Janece, FNP Primary Care Physician:  No primary care provider on file.  HPI:  Kara Kelley is a 59 y.o. female here as a referral from Dr. Moore for left upper extremity numbness and tingling of the hand.   Summary: Nerve Conduction studies were performed on the bilateral upper extremities.  ADM Ulnar and APB Median motor conductions were within normal limits with normal F wave latencies.  2nd-digit Median, 5th-digit Ulnar and radial sensory conductions were within normal limits.   EMG needle evaluation was performed on selected left upper extremity muscles: The left Deltoid, left Triceps, left Biceps, left Flexor Digitorum Profundus (ulnar), left Pronator Teres, left First Dorsal Interoseous, left Opponens Pollicis and left C8T1, C7/C8, C6C7 paraspinal muscles were normal.  Conclusion: This is a normal study. No electrophysiologic evidence for median or ulnar neuropathy or cervical radiculopathy.   Kara Sasaki, MD  [facility] [address] [place], [place] [address]  Phone [phone] Fax [phone]  

## 2015-12-09 ENCOUNTER — Encounter: Payer: Self-pay | Admitting: Internal Medicine

## 2015-12-09 ENCOUNTER — Ambulatory Visit (INDEPENDENT_AMBULATORY_CARE_PROVIDER_SITE_OTHER): Payer: Medicaid Other | Admitting: Internal Medicine

## 2015-12-09 VITALS — BP 135/87 | HR 77 | Temp 98.0°F | Ht 60.0 in | Wt 222.0 lb

## 2015-12-09 DIAGNOSIS — B182 Chronic viral hepatitis C: Secondary | ICD-10-CM | POA: Insufficient documentation

## 2015-12-09 MED ORDER — ELBASVIR-GRAZOPREVIR 50-100 MG PO TABS: 1.0000 | ORAL_TABLET | Freq: Every day | ORAL | Status: DC

## 2015-12-09 NOTE — Patient Instructions (Signed)
Date 12/09/2015  Dear Ms Gaspar Skeeters, As discussed in the ID Clinic, your hepatitis C therapy will include the following medications:          Zepatier (elbasvir 50 mg/grazoprevir 100 mg) for 12 weeks               Please note that ALL MEDICATIONS WILL START ON THE SAME DATE for a total of 12 weeks. ---------------------------------------------------------------- Your HCV Treatment Start Date: TBA   Your HCV genotype:  1b    Liver Fibrosis: TBD    ---------------------------------------------------------------- YOUR PHARMACY CONTACT:   [facility] [address] Phone: [phone] Hours: Monday to Friday 7:30 am to 6:00 pm   Please always contact your pharmacy at least 3-4 business days before you run out of medications to ensure your next month's medication is ready or 1 week prior to running out if you receive it by mail.  Remember, each prescription is for 28 days. ---------------------------------------------------------------- GENERAL NOTES REGARDING YOUR HEPATITIS C MEDICATION:  ZEPATIER is available as a beige-colored, oval-shaped, film-coated tablet debossed with "770" on one side and plain on the other. Each tablet contains 50 mg elbasvir and 100 mg grazoprevir.  Common side effects of ZEPATIER when used without ribavirin include: - feeling tired -trouble sleeping - headache -diarrhea - nausea  Common side effects of ZEPATIER when used with ribavirin include: - low red blood cell counts (anemia) - feeling irritable - headache - stomach pain - feeling tired - depression - shortness of breath - joint pain - rash or itching  Please note that this only lists the most common side effects and is NOT a comprehensive list of the potential side effects of these medications. For more information, please review the drug information sheets that come with your medication package from the pharmacy.   ---------------------------------------------------------------- GENERAL HELPFUL HINTS ON HCV THERAPY: 1. Stay well-hydrated. 2. Notify the ID Clinic of any changes in your other over-the-counter/herbal or prescription medications. 3. If you miss a dose of your medication, take the missed dose as soon as you remember. Return to your regular time/dose schedule the next day.  4.  Do not stop taking your medications without first talking with your healthcare provider. 5.  You may take Tylenol (acetaminophen), as long as the dose is less than 2000 mg (OR no more than 4 tablets of the Tylenol Extra Strengths  tablet) in 24 hours. 6.  You will see our pharmacist-specialist within the first 2 weeks of starting your medication. 7.  You will need to obtain routine labs around week 4 and12 weeks after starting and then 3 to 6 months after finishing Zepatier.   8.  If ribavirin is part of your regimen, you also will have a lab visit within 2 weeks.   Staci Righter, MD  [facility] [address] [place] [address] [place], [place]  [address] [phone]

## 2015-12-09 NOTE — Progress Notes (Signed)
[facility] Disease   CC: consideration for treatment for chronic hepatitis C  HPI:  +Kara Kelley is a 60 y.o. female who presents for initial evaluation and management of chronic hepatitis C.  Patient tested positive over 10 years ago. Hepatitis C-associated risk factors present are: IV drug abuse (details: last used over 6 months ago). Patient denies history of blood transfusion, intranasal drug use. Patient has had other studies performed. Results: hepatitis C RNA by PCR, result: positive. Patient has not had prior treatment for Hepatitis C. Patient does not have a past history of liver disease. Patient does not have a family history of liver disease. Patient does not  have associated signs or symptoms related to liver disease.  Labs reviewed and confirm chronic hepatitis C with a positive viral load.   Records reviewed from PCP, on omeprazole 20 mg daily.       Patient does have documented immunity to Hepatitis A. Patient does have documented immunity to Hepatitis B.    Review of Systems:  Constitutional: negative for fatigue Gastrointestinal: negative for diarrhea Musculoskeletal: negative for myalgias and arthralgias All other systems reviewed and are negative      Past Medical History  Diagnosis Date  . Hepatitis C   . Diabetes mellitus   . Migraine   . Degenerative joint disease   . Chronic pain   . GERD (gastroesophageal reflux disease)   . Bunion   . Callus   . Corns and callosities   . Hypertension   . Substance abuse     alcoholism  . Anemia   . Arthritis   . Depression   . Hyperlipidemia   . ETOH abuse   . Cocaine abuse   . Gall stones     Prior to Admission medications   Medication Sig Start Date End Date Taking? Authorizing Provider  ABILIFY 10 MG tablet Take 10 mg by mouth daily. 05/20/15  Yes Historical Provider, MD  albuterol (PROVENTIL HFA;VENTOLIN HFA) 108 (90 BASE) MCG/ACT inhaler Inhale 2 puffs into the lungs every 6  (six) hours as needed. For shortness of breath.   Yes Historical Provider, MD  Blood Glucose Monitoring Suppl (ACCU-CHEK AVIVA PLUS) W/DEVICE KIT 1 Device by Does not apply route once. 06/10/14  Yes Renato Shin, MD  buPROPion (WELLBUTRIN XL) 300 MG 24 hr tablet Take 300 mg by mouth every morning. 05/14/15  Yes Historical Provider, MD  cyclobenzaprine (FLEXERIL) 10 MG tablet Take 1 tablet (10 mg total) by mouth 2 (two) times daily as needed for muscle spasms. 09/05/15  Yes Gareth Morgan, MD  gabapentin (NEURONTIN) 300 MG capsule Take 300 mg by mouth 3 (three) times daily.   Yes Historical Provider, MD  glucose blood (ACCU-CHEK AVIVA) test strip 1 each by Other route 2 (two) times daily. And lancets 2/day 250.43 06/10/14  Yes Renato Shin, MD  insulin aspart (NOVOLOG) 100 UNIT/ML injection Inject 18 Units into the skin 3 (three) times daily before meals.    Yes Historical Provider, MD  insulin detemir (LEVEMIR) 100 UNIT/ML injection Inject 50 Units into the skin at bedtime.    Yes Historical Provider, MD  naproxen (NAPROSYN) 500 MG tablet Take 1 tablet (500 mg total) by mouth 2 (two) times daily. 08/25/15  Yes Gloriann Loan, PA-C  omeprazole (PRILOSEC) 20 MG capsule Take 1 capsule (20 mg total) by mouth daily. 06/18/15  Yes Wenda Overland Little, MD  polyethylene glycol (MIRALAX / GLYCOLAX) packet Take 17 g by mouth at  bedtime as needed. 05/14/15  Yes Historical Provider, MD  spironolactone (ALDACTONE) 50 MG tablet Take 50 mg by mouth daily.   Yes Historical Provider, MD  traZODone (DESYREL) 100 MG tablet Take 100 mg by mouth at bedtime. 05/18/15  Yes Historical Provider, MD  Elbasvir-Grazoprevir (ZEPATIER) 50-100 MG TABS Take 1 tablet by mouth daily. 12/09/15   Thayer Headings, MD  hydrOXYzine (ATARAX/VISTARIL) 25 MG tablet Take 1 tablet (25 mg total) by mouth every 6 (six) hours as needed for itching. Patient not taking: Reported on 12/09/2015 10/01/14   Delice Bison Ward, DO    No Known Allergies  Social History    Substance Use Topics  . Smoking status: Former Smoker -- 0.50 packs/day    Types: Cigarettes    Quit date: 12/10/1915  . Smokeless tobacco: Never Used  . Alcohol Use: No     Comment: in rehab    Family History  Problem Relation Age of Onset  . Diabetes      mat great aunt  . Heart disease Mother   . Cirrhosis      mat great aunt  . Cirrhosis      mat great uncles x 2  . Colon cancer Neg Hx   no liver cancer   Objective:  Constitutional: in no apparent distress,  Filed Vitals:   12/09/15 1433  BP: 135/87  Pulse: 77  Temp: 98 F (36.7 C)   Eyes: anicteric Cardiovascular: Cor RRR and No murmurs Respiratory: CTA B; normal respiratory effort Gastrointestinal: Bowel sounds are normal, liver is not enlarged, spleen is not enlarged Musculoskeletal: peripheral pulses normal, no pedal edema, no clubbing or cyanosis Skin: negative for - jaundice, spider hemangioma, telangiectasia, palmar erythema, ecchymosis and atrophy; no porphyria cutanea tarda Lymphatic: no cervical lymphadenopathy   Laboratory Genotype: 1b HCV viral load:  Lab Results  Component Value Date   WBC 6.1 07/11/2015   HGB 13.4 07/11/2015   HCT 39.6 07/11/2015   MCV 77.6* 07/11/2015   PLT 124* 07/11/2015    Lab Results  Component Value Date   CREATININE 1.12* 07/11/2015   BUN 10 07/11/2015   NA 132* 07/11/2015   K 4.4 07/11/2015   CL 103 07/11/2015   CO2 20* 07/11/2015    Lab Results  Component Value Date   ALT 25 07/11/2015   AST 29 07/11/2015   ALKPHOS 62 07/11/2015     Labs and history reviewed and show CHILD-PUGH A  5-6 points: Child class A 7-9 points: Child class B 10-15 points: Child class C  Lab Results  Component Value Date   INR 0.99 10/29/2015   BILITOT 0.5 07/11/2015   ALBUMIN 3.8 07/11/2015     Assessment: New Patient with Chronic Hepatitis C genotype 1b, untreated.  I discussed with the patient the lab findings that confirm chronic hepatitis C as well as the natural  history and progression of disease including about 30% of people who develop cirrhosis of the liver if left untreated and once cirrhosis is established there is a 2-7% risk per year of liver cancer and liver failure.  I discussed the importance of treatment and benefits in reducing the risk, even if significant liver fibrosis exists.   Plan: 1) Patient counseled extensively on limiting acetaminophen to no more than 2 grams daily, avoidance of alcohol. 2) Transmission discussed with patient including sexual transmission, sharing razors and toothbrush.   3) Will need referral to gastroenterology if concern for cirrhosis 4) Will need referral for substance abuse counseling:  No.; Further work up to include urine drug screen  No.  Is currently in a program and tells me she is 6 months clean.  5) Will prescribe Zepatier for 12 weeks 6) Hepatitis A vaccine No. 7) Hepatitis B vaccine No. 8) Pneumovax vaccine if concern for cirrhosis 9) Further work up to include liver staging with elastography 10) NS5A test  No. 10) will follow up after starting medication

## 2016-01-25 ENCOUNTER — Ambulatory Visit (INDEPENDENT_AMBULATORY_CARE_PROVIDER_SITE_OTHER): Payer: Medicaid Other | Admitting: Podiatry

## 2016-01-25 ENCOUNTER — Encounter: Payer: Self-pay | Admitting: Podiatry

## 2016-01-25 VITALS — BP 157/89 | HR 77

## 2016-01-25 DIAGNOSIS — M79673 Pain in unspecified foot: Secondary | ICD-10-CM | POA: Diagnosis not present

## 2016-01-25 DIAGNOSIS — M2041 Other hammer toe(s) (acquired), right foot: Secondary | ICD-10-CM

## 2016-01-25 DIAGNOSIS — L720 Epidermal cyst: Secondary | ICD-10-CM

## 2016-01-25 DIAGNOSIS — R234 Changes in skin texture: Secondary | ICD-10-CM | POA: Diagnosis not present

## 2016-01-25 DIAGNOSIS — M2040 Other hammer toe(s) (acquired), unspecified foot: Secondary | ICD-10-CM

## 2016-01-25 DIAGNOSIS — Q828 Other specified congenital malformations of skin: Secondary | ICD-10-CM | POA: Diagnosis not present

## 2016-01-25 DIAGNOSIS — M79606 Pain in leg, unspecified: Secondary | ICD-10-CM

## 2016-01-25 NOTE — Patient Instructions (Signed)
Seen for pain on balls of both feet and 5th toe right. All painful lesions debrided.  Pre op consent form reviewed for hammer toe repair 5th right.

## 2016-01-25 NOTE — Progress Notes (Signed)
Subjective: 60 year old female presents complaining of pain under the balls of both feet and painful 5th toe right foot.   Objective: Plantar callus under 2nd MPJ bilateral, under 5th MPJ right. Severe contracted 5th digit with digital corn right foot with pain upon direct pressure. Elevated first ray bilateral. All nails are hypertrophic 2-5 bilateral.  Neurovascular status are within normal.  Radiographic examination of right foot reveal contracted 5th toe right. Post surgical internal fixation screws noted over the 1st and 2nd metatarsal neck area. No acute changes noted.  Assessment: Painful calluses under 2nd bilateral. IPK under 5th MPJ left. Painful corn 5th digit right. Contracted digit 5th right. Forefoot varus bilateral.  Mycotic nails x 8.  Plan: Reviewed clinical findings and available treatment options.  All nails and calluses debrided.  Patient request surgical correction of deformed toe 5th right. Surgery consent form was reviewed for Hammer toe repair 5th right.

## 2016-02-11 ENCOUNTER — Other Ambulatory Visit: Payer: Self-pay | Admitting: Podiatry

## 2016-02-11 MED ORDER — HYDROCODONE-IBUPROFEN 7.5-200 MG PO TABS: 1.0000 | ORAL_TABLET | Freq: Four times a day (QID) | ORAL | Status: DC | PRN

## 2016-02-12 DIAGNOSIS — M2040 Other hammer toe(s) (acquired), unspecified foot: Secondary | ICD-10-CM

## 2016-02-12 HISTORY — PX: OTHER SURGICAL HISTORY: SHX169

## 2016-02-18 ENCOUNTER — Ambulatory Visit (INDEPENDENT_AMBULATORY_CARE_PROVIDER_SITE_OTHER): Payer: Medicaid Other | Admitting: Podiatry

## 2016-02-18 ENCOUNTER — Encounter: Payer: Self-pay | Admitting: Podiatry

## 2016-02-18 VITALS — BP 156/84 | HR 74

## 2016-02-18 DIAGNOSIS — M2041 Other hammer toe(s) (acquired), right foot: Secondary | ICD-10-CM

## 2016-02-18 DIAGNOSIS — M79604 Pain in right leg: Secondary | ICD-10-CM

## 2016-02-18 NOTE — Patient Instructions (Addendum)
6 day post op following hammer toe repair 5th digit right, wound healing well with minimum edema. Post op X-ray show normal findings.

## 2016-02-18 NOTE — Progress Notes (Signed)
Subjective: 6 days post op (02/12/16), hammer toe repair 5th digit right foot. The toe is sore since bumping into her stove.  Patient also complaining of pain under the 5th MPJ area of left foot being very sore.   Past history include the same kind of lesion under the 5th MPJ on right foot, which was excised and no recurrent symptoms.  Objective: 5th digit right foot wound is clean and dry. No edema noted. Clustered porokeratotic lesion under 5th MPJ area left foot.   Post op X-ray:  5th digit right show removed proximal phalangeal head with absent middle phalanx 5th digit right.  Post surgical internal fixation screw noted at the 1st and 2nd metatarsal head. No acute changes in osseous or articular surface noted.   Assessment: 1. Normal post op wound 5th digit right. 2. IPK painful callus under 5th MPJ left.   Plan:  Wound cleansed with Iodine and dressed with DSD.  May benefit from Excision of the plantar lesion under 5th MPJ left foot. Return in one week.

## 2016-02-22 ENCOUNTER — Encounter: Payer: Self-pay | Admitting: *Deleted

## 2016-02-25 ENCOUNTER — Ambulatory Visit (INDEPENDENT_AMBULATORY_CARE_PROVIDER_SITE_OTHER): Payer: Medicaid Other | Admitting: Podiatry

## 2016-02-25 ENCOUNTER — Encounter: Payer: Self-pay | Admitting: Podiatry

## 2016-02-25 DIAGNOSIS — Z9889 Other specified postprocedural states: Secondary | ICD-10-CM

## 2016-02-25 NOTE — Patient Instructions (Signed)
2 weeks post op wound healing well. Sutures removed and placed Mefix tape. Return in 2 weeks.

## 2016-02-25 NOTE — Progress Notes (Signed)
2 weeks post op Hammer toe repair 5th right (02/12/16). Wound healing normal without complication. Suture removed and placed in Mefix tape with compression stockinet.  Ok to get wet. Return in 2 weeks. May discuss removal of left foot lesion (sub 5) plantar on next visit.

## 2016-03-01 ENCOUNTER — Telehealth: Payer: Self-pay | Admitting: *Deleted

## 2016-03-01 NOTE — Telephone Encounter (Signed)
Patient called triage to inquire about the status of her hepatitis C treatment.  Per Jeannette HowBetty Howard, Medicaid needs results of elastography before approving medication.  Patient asking for status of elastography prior authorization.  Andree CossHowell, Marvyn Torrez M, RN

## 2016-03-03 ENCOUNTER — Telehealth: Payer: Self-pay | Admitting: *Deleted

## 2016-03-03 NOTE — Telephone Encounter (Signed)
Patient notified of appt for ultrasound at [facility] on 03/30/16 at 10:45 AM. Nothing to eat or drink after midnight. This test has been prior authorized [id number] abd expires on 04/02/16. Kara MolaJacqueline Cockerham

## 2016-03-03 NOTE — Telephone Encounter (Signed)
Patient notified of appt for ultrasound. Kara Kelley

## 2016-03-17 ENCOUNTER — Ambulatory Visit (INDEPENDENT_AMBULATORY_CARE_PROVIDER_SITE_OTHER): Payer: Medicaid Other | Admitting: Podiatry

## 2016-03-17 ENCOUNTER — Encounter: Payer: Self-pay | Admitting: Podiatry

## 2016-03-17 DIAGNOSIS — M79605 Pain in left leg: Secondary | ICD-10-CM | POA: Diagnosis not present

## 2016-03-17 DIAGNOSIS — Q828 Other specified congenital malformations of skin: Secondary | ICD-10-CM

## 2016-03-17 DIAGNOSIS — R234 Changes in skin texture: Secondary | ICD-10-CM | POA: Diagnosis not present

## 2016-03-17 NOTE — Progress Notes (Signed)
Subjective: Status Post 4 weeks post op Hammer toe repair 5th right (02/12/16). Having severe pain under left foot. Patient request left foot callus be trimmed.  Objective: Wound healing normal without complication.  Assessment: Post surgical 5th toe right has healed well without complication. Severe porokeratosis under 5th MPJ left, 2nd MPJ right.  Plan: Reviewed findings and available treatment options.  All painful lesions debrided.

## 2016-03-17 NOTE — Patient Instructions (Signed)
Surgical site healed well. Painful callus trimmed. Return as needed.

## 2016-03-30 ENCOUNTER — Ambulatory Visit (HOSPITAL_COMMUNITY): Payer: Medicaid Other

## 2016-04-07 ENCOUNTER — Ambulatory Visit (INDEPENDENT_AMBULATORY_CARE_PROVIDER_SITE_OTHER): Payer: Medicaid Other | Admitting: Podiatry

## 2016-04-07 ENCOUNTER — Encounter: Payer: Self-pay | Admitting: Podiatry

## 2016-04-07 DIAGNOSIS — Z9889 Other specified postprocedural states: Secondary | ICD-10-CM

## 2016-04-07 NOTE — Patient Instructions (Signed)
Normal post op wound healing on 5th toe right with scab formation. Debrided. Keep it wrapped during the day. Remove wrap at night. Return as needed.

## 2016-04-07 NOTE — Progress Notes (Signed)
Patient complaining of sore toe 5th right, pain in callus under 5th MPJ left. 8 weeks S/P Hammer toe surgery 5th right (02/12/16). Normal post op wound healing with scab over incision site. Recurrent plantar callus left sub 5. All debrided. Compression stockinet dispensed to wear during the day on right foot. Return as needed.

## 2016-05-09 ENCOUNTER — Telehealth: Payer: Self-pay | Admitting: *Deleted

## 2016-05-09 NOTE — Telephone Encounter (Signed)
Patient's ultrasound appt for tomorrow needs to be cancelled, as more time is needed to prior authorize this test through Medicaid. Left message to return my call. Wendall Mola

## 2016-05-10 ENCOUNTER — Ambulatory Visit (HOSPITAL_COMMUNITY): Payer: Medicaid Other

## 2016-05-16 ENCOUNTER — Telehealth: Payer: Self-pay | Admitting: *Deleted

## 2016-05-16 NOTE — Telephone Encounter (Signed)
Prior authorization initiated for ultrasound elastography. Stated this office will be notified in 24 hours by fax or phone on decision. Kara Kelley

## 2016-05-18 NOTE — Telephone Encounter (Signed)
PA for ultrasound denied. Please advise if patient needs a fibrosure test. Wendall MolaJacqueline Rohnan Bartleson

## 2016-05-23 ENCOUNTER — Other Ambulatory Visit: Payer: Self-pay | Admitting: Internal Medicine

## 2016-05-23 DIAGNOSIS — B182 Chronic viral hepatitis C: Secondary | ICD-10-CM

## 2016-05-23 NOTE — Telephone Encounter (Signed)
Kara RhodesBetty, will this patients insurance require Fibrosure for approval?  She was denied elastography.  Or can we just get her approved without it? Thanks, Rob

## 2016-05-23 NOTE — Telephone Encounter (Addendum)
Left patient a voice mail to call the clinic to schedule a lab appt for a fibrosure test. She will need to be fasting for this test. Wendall MolaJacqueline Cockerham

## 2016-05-23 NOTE — Telephone Encounter (Signed)
Medicaid will require a Fibrosure.  Thanks.

## 2016-06-01 ENCOUNTER — Other Ambulatory Visit: Payer: Self-pay | Admitting: Nurse Practitioner

## 2016-06-01 DIAGNOSIS — Z1231 Encounter for screening mammogram for malignant neoplasm of breast: Secondary | ICD-10-CM

## 2016-06-01 NOTE — Telephone Encounter (Signed)
She scheduled a lab appt for 06/03/16. Kara Kelley

## 2016-06-03 ENCOUNTER — Other Ambulatory Visit: Payer: Medicaid Other

## 2016-06-10 ENCOUNTER — Ambulatory Visit: Payer: Medicaid Other

## 2016-06-15 ENCOUNTER — Other Ambulatory Visit: Payer: Medicaid Other

## 2016-06-15 ENCOUNTER — Other Ambulatory Visit: Payer: Self-pay | Admitting: Pharmacist Clinician (PhC)/ Clinical Pharmacy Specialist

## 2016-06-15 ENCOUNTER — Other Ambulatory Visit: Payer: Self-pay | Admitting: Internal Medicine

## 2016-06-15 DIAGNOSIS — B182 Chronic viral hepatitis C: Secondary | ICD-10-CM

## 2016-06-17 LAB — HEPATITIS C RNA QUANTITATIVE
HCV QUANT LOG: 5.3 {Log} — AB (ref <1.18)
HCV QUANT: 199049 [IU]/mL — AB (ref <15)

## 2016-06-21 ENCOUNTER — Ambulatory Visit (HOSPITAL_COMMUNITY): Payer: Medicaid Other

## 2016-06-23 LAB — LIVER FIBROSIS, FIBROTEST-ACTITEST

## 2016-06-28 ENCOUNTER — Other Ambulatory Visit: Payer: Self-pay | Admitting: Internal Medicine

## 2016-06-28 DIAGNOSIS — R1084 Generalized abdominal pain: Secondary | ICD-10-CM

## 2016-07-05 ENCOUNTER — Ambulatory Visit
Admission: RE | Admit: 2016-07-05 | Discharge: 2016-07-05 | Disposition: A | Discharge status: Home / Self Care | Payer: Medicaid Other | Source: Ambulatory Visit | Attending: Internal Medicine | Admitting: Internal Medicine

## 2016-07-05 DIAGNOSIS — R1084 Generalized abdominal pain: Secondary | ICD-10-CM

## 2016-07-13 ENCOUNTER — Ambulatory Visit: Payer: Medicaid Other

## 2016-07-25 ENCOUNTER — Other Ambulatory Visit: Payer: Self-pay | Admitting: General Surgery

## 2016-08-03 ENCOUNTER — Encounter (HOSPITAL_COMMUNITY): Payer: Self-pay

## 2016-08-08 ENCOUNTER — Encounter (HOSPITAL_COMMUNITY): Payer: Self-pay

## 2016-08-08 NOTE — Patient Instructions (Addendum)
Loma Sousaauline V Radcliffe  08/08/2016   Your procedure is scheduled on: 08/11/2016    [facility]  Entrance take [place]  elevators to 3rd floor to  Short Stay Center at   0530 AM.  Call this number if you have problems the morning of surgery [phone]   Remember: ONLY 1 PERSON MAY GO WITH YOU TO SHORT STAY TO GET  READY MORNING OF YOUR SURGERY.  Do not eat food or drink liquids :After Midnight.     Take these medicines the morning of surgery with A SIP OF WATER: Abilify, albuterol Inhaler If needed and bring, Wellbutrin, Prilosec  DO NOT TAKE ANY DIABETIC MEDICATIONS DAY OF YOUR SURGERY                               You may not have any metal on your body including hair pins and              piercings  Do not wear jewelry, make-up, lotions, powders or perfumes, deodorant             Do not wear nail polish.  Do not shave  48 hours prior to surgery.            Do not bring valuables to the hospital. [facility] IS NOT             RESPONSIBLE   FOR VALUABLES.  Contacts, dentures or bridgework may not be worn into surgery.       Patients discharged the day of surgery will not be allowed to drive home.  Name and phone number of your driver:  Special Instructions: N/A              Please read over the following fact sheets you were given: _____________________________________________________________________             [facility] - Preparing for Surgery Before surgery, you can play an important role.  Because skin is not sterile, your skin needs to be as free of germs as possible.  You can reduce the number of germs on your skin by washing with CHG (chlorahexidine gluconate) soap before surgery.  CHG is an antiseptic cleaner which kills germs and bonds with the skin to continue killing germs even after washing. Please DO NOT use if you have an allergy to CHG or antibacterial soaps.  If your skin becomes reddened/irritated stop using the CHG and inform  your nurse when you arrive at Short Stay. Do not shave (including legs and underarms) for at least 48 hours prior to the first CHG shower.  You may shave your face/neck. Please follow these instructions carefully:  1.  Shower with CHG Soap the night before surgery and the  morning of Surgery.  2.  If you choose to wash your hair, wash your hair first as usual with your  normal  shampoo.  3.  After you shampoo, rinse your hair and body thoroughly to remove the  shampoo.                           4.  Use CHG as you would any other liquid soap.  You can apply chg directly  to the skin and wash  Gently with a scrungie or clean washcloth.  5.  Apply the CHG Soap to your body ONLY FROM THE NECK DOWN.   Do not use on face/ open                           Wound or open sores. Avoid contact with eyes, ears mouth and genitals (private parts).                       Wash face,  Genitals (private parts) with your normal soap.             6.  Wash thoroughly, paying special attention to the area where your surgery  will be performed.  7.  Thoroughly rinse your body with warm water from the neck down.  8.  DO NOT shower/wash with your normal soap after using and rinsing off  the CHG Soap.                9.  Pat yourself dry with a clean towel.            10.  Wear clean pajamas.            11.  Place clean sheets on your bed the night of your first shower and do not  sleep with pets. Day of Surgery : Do not apply any lotions/deodorants the morning of surgery.  Please wear clean clothes to the hospital/surgery center.  FAILURE TO FOLLOW THESE INSTRUCTIONS MAY RESULT IN THE CANCELLATION OF YOUR SURGERY PATIENT SIGNATURE_________________________________  NURSE SIGNATURE__________________________________  ________________________________________________________________________ How to Manage Your Diabetes Before and After Surgery  Why is it important to control my blood sugar before and  after surgery? . Improving blood sugar levels before and after surgery helps healing and can limit problems. . A way of improving blood sugar control is eating a healthy diet by: o  Eating less sugar and carbohydrates o  Increasing activity/exercise o  Talking with your doctor about reaching your blood sugar goals . High blood sugars (greater than 180 mg/dL) can raise your risk of infections and slow your recovery, so you will need to focus on controlling your diabetes during the weeks before surgery. . Make sure that the doctor who takes care of your diabetes knows about your planned surgery including the date and location.  How do I manage my blood sugar before surgery? . Check your blood sugar at least 4 times a day, starting 2 days before surgery, to make sure that the level is not too high or low. o Check your blood sugar the morning of your surgery when you wake up and every 2 hours until you get to the Short Stay unit. . If your blood sugar is less than 70 mg/dL, you will need to treat for low blood sugar: o Do not take insulin. o Treat a low blood sugar (less than 70 mg/dL) with  cup of clear juice (cranberry or apple), 4 glucose tablets, OR glucose gel. o Recheck blood sugar in 15 minutes after treatment (to make sure it is greater than 70 mg/dL). If your blood sugar is not greater than 70 mg/dL on recheck, call [phone] for further instructions. . Report your blood sugar to the short stay nurse when you get to Short Stay.  . If you are admitted to the hospital after surgery: o Your blood sugar will be checked by the staff and you will probably be  given insulin after surgery (instead of oral diabetes medicines) to make sure you have good blood sugar levels. o The goal for blood sugar control after surgery is 80-180 mg/dL.   WHAT DO I DO ABOUT MY DIABETES MEDICATION?  [name]. Do not take oral diabetes medicines (pills) the morning of surgery.  . THE NIGHT BEFORE SURGERY, take  ____27_______ units of __LEVEMIR _________insulin.       . THE MORNING OF SURGERY, take ________0_____ units of __________insulin.  . The day of surgery, do not take other diabetes injectables, including Byetta (exenatide), Bydureon (exenatide ER), Victoza (liraglutide), or Trulicity (dulaglutide).  . If your CBG is greater than 220 mg/dL, you may take  of your sliding scale  . (correction) dose of insulin.  Patient Signature:  Date:   Nurse Signature:  Date:   Reviewed and Endorsed by [facility] Patient Education Committee, August 2015

## 2016-08-09 ENCOUNTER — Encounter (HOSPITAL_COMMUNITY)
Admission: RE | Admit: 2016-08-09 | Discharge: 2016-08-09 | Disposition: A | Discharge status: Home / Self Care | Payer: Medicaid Other | Source: Ambulatory Visit | Attending: General Surgery | Admitting: General Surgery

## 2016-08-09 ENCOUNTER — Encounter (HOSPITAL_COMMUNITY): Payer: Self-pay

## 2016-08-09 ENCOUNTER — Other Ambulatory Visit: Payer: Self-pay

## 2016-08-09 DIAGNOSIS — Z01818 Encounter for other preprocedural examination: Secondary | ICD-10-CM | POA: Diagnosis not present

## 2016-08-09 HISTORY — DX: Unspecified asthma, uncomplicated: J45.909

## 2016-08-09 LAB — CBC WITH DIFFERENTIAL/PLATELET
BASOS ABS: 0 10*3/uL (ref 0.0–0.1)
BASOS PCT: 0 %
Eosinophils Absolute: 0.1 10*3/uL (ref 0.0–0.7)
Eosinophils Relative: 2 %
HEMATOCRIT: 39.6 % (ref 36.0–46.0)
HEMOGLOBIN: 13.9 g/dL (ref 12.0–15.0)
LYMPHS PCT: 27 %
Lymphs Abs: 2.5 10*3/uL (ref 0.7–4.0)
MCH: 26.5 pg (ref 26.0–34.0)
MCHC: 35.1 g/dL (ref 30.0–36.0)
MCV: 75.6 fL — AB (ref 78.0–100.0)
Monocytes Absolute: 0.5 10*3/uL (ref 0.1–1.0)
Monocytes Relative: 5 %
NEUTROS ABS: 6 10*3/uL (ref 1.7–7.7)
Neutrophils Relative %: 66 %
Platelets: 142 10*3/uL — ABNORMAL LOW (ref 150–400)
RBC: 5.24 MIL/uL — ABNORMAL HIGH (ref 3.87–5.11)
RDW: 14.4 % (ref 11.5–15.5)
WBC: 9.1 10*3/uL (ref 4.0–10.5)

## 2016-08-09 LAB — COMPREHENSIVE METABOLIC PANEL
ALBUMIN: 4.1 g/dL (ref 3.5–5.0)
ALK PHOS: 76 U/L (ref 38–126)
ALT: 48 U/L (ref 14–54)
AST: 34 U/L (ref 15–41)
Anion gap: 6 (ref 5–15)
BILIRUBIN TOTAL: 0.6 mg/dL (ref 0.3–1.2)
BUN: 14 mg/dL (ref 6–20)
CALCIUM: 9.7 mg/dL (ref 8.9–10.3)
CO2: 26 mmol/L (ref 22–32)
CREATININE: 1.09 mg/dL — AB (ref 0.44–1.00)
Chloride: 107 mmol/L (ref 101–111)
GFR calc Af Amer: >60 mL/min (ref >60)
GFR, EST NON AFRICAN AMERICAN: 55 mL/min — AB (ref >60)
GLUCOSE: 285 mg/dL — AB (ref 65–99)
Potassium: 4.7 mmol/L (ref 3.5–5.1)
Sodium: 139 mmol/L (ref 135–145)
TOTAL PROTEIN: 7.7 g/dL (ref 6.5–8.1)

## 2016-08-09 LAB — URINALYSIS, ROUTINE W REFLEX MICROSCOPIC
BILIRUBIN URINE: NEGATIVE
GLUCOSE, UA: 500 mg/dL — AB
HGB URINE DIPSTICK: NEGATIVE
KETONES UR: NEGATIVE mg/dL
Leukocytes, UA: NEGATIVE
Nitrite: NEGATIVE
PROTEIN: 30 mg/dL — AB
Specific Gravity, Urine: 1.017 (ref 1.005–1.030)
pH: 6 (ref 5.0–8.0)

## 2016-08-09 LAB — URINE MICROSCOPIC-ADD ON

## 2016-08-09 LAB — GLUCOSE, CAPILLARY: Glucose-Capillary: 282 mg/dL — ABNORMAL HIGH (ref 65–99)

## 2016-08-09 NOTE — Progress Notes (Signed)
LOV- PCP-06/10/2016 on chart

## 2016-08-09 NOTE — Progress Notes (Signed)
CBC/DIFF/CMP and U/A done 08/09/16 faxed via EPIC to Dr Donell BeersByerly.

## 2016-08-10 LAB — HEMOGLOBIN A1C
Hgb A1c MFr Bld: 9.9 % — ABNORMAL HIGH (ref 4.8–5.6)
Mean Plasma Glucose: 237 mg/dL

## 2016-08-10 NOTE — Progress Notes (Signed)
HGBA! C done 08/09/2016 faxed via EPIC to Dr Donell BeersByerly

## 2016-08-11 ENCOUNTER — Ambulatory Visit (HOSPITAL_COMMUNITY)
Admission: RE | Admit: 2016-08-11 | Discharge: 2016-08-11 | Disposition: A | Discharge status: Home / Self Care | Payer: Medicaid Other | Source: Ambulatory Visit | Attending: General Surgery | Admitting: General Surgery

## 2016-08-11 ENCOUNTER — Ambulatory Visit (HOSPITAL_COMMUNITY): Payer: Medicaid Other

## 2016-08-11 ENCOUNTER — Ambulatory Visit (HOSPITAL_COMMUNITY): Payer: Medicaid Other | Admitting: Certified Registered Nurse Anesthetist

## 2016-08-11 ENCOUNTER — Encounter (HOSPITAL_COMMUNITY): Payer: Self-pay | Admitting: *Deleted

## 2016-08-11 ENCOUNTER — Encounter (HOSPITAL_COMMUNITY)
Admission: RE | Disposition: A | Discharge status: Home / Self Care | Payer: Self-pay | Source: Ambulatory Visit | Attending: General Surgery

## 2016-08-11 DIAGNOSIS — D649 Anemia, unspecified: Secondary | ICD-10-CM | POA: Diagnosis not present

## 2016-08-11 DIAGNOSIS — Z8249 Family history of ischemic heart disease and other diseases of the circulatory system: Secondary | ICD-10-CM | POA: Diagnosis not present

## 2016-08-11 DIAGNOSIS — F329 Major depressive disorder, single episode, unspecified: Secondary | ICD-10-CM | POA: Insufficient documentation

## 2016-08-11 DIAGNOSIS — K8010 Calculus of gallbladder with chronic cholecystitis without obstruction: Secondary | ICD-10-CM | POA: Diagnosis present

## 2016-08-11 DIAGNOSIS — G43909 Migraine, unspecified, not intractable, without status migrainosus: Secondary | ICD-10-CM | POA: Insufficient documentation

## 2016-08-11 DIAGNOSIS — Z79891 Long term (current) use of opiate analgesic: Secondary | ICD-10-CM | POA: Diagnosis not present

## 2016-08-11 DIAGNOSIS — F419 Anxiety disorder, unspecified: Secondary | ICD-10-CM | POA: Diagnosis not present

## 2016-08-11 DIAGNOSIS — N951 Menopausal and female climacteric states: Secondary | ICD-10-CM | POA: Diagnosis not present

## 2016-08-11 DIAGNOSIS — I10 Essential (primary) hypertension: Secondary | ICD-10-CM | POA: Insufficient documentation

## 2016-08-11 DIAGNOSIS — B1920 Unspecified viral hepatitis C without hepatic coma: Secondary | ICD-10-CM | POA: Insufficient documentation

## 2016-08-11 DIAGNOSIS — J449 Chronic obstructive pulmonary disease, unspecified: Secondary | ICD-10-CM | POA: Insufficient documentation

## 2016-08-11 DIAGNOSIS — Z794 Long term (current) use of insulin: Secondary | ICD-10-CM | POA: Insufficient documentation

## 2016-08-11 DIAGNOSIS — K219 Gastro-esophageal reflux disease without esophagitis: Secondary | ICD-10-CM | POA: Insufficient documentation

## 2016-08-11 DIAGNOSIS — Z419 Encounter for procedure for purposes other than remedying health state, unspecified: Secondary | ICD-10-CM

## 2016-08-11 DIAGNOSIS — E119 Type 2 diabetes mellitus without complications: Secondary | ICD-10-CM | POA: Insufficient documentation

## 2016-08-11 HISTORY — PX: CHOLECYSTECTOMY: SHX55

## 2016-08-11 LAB — GLUCOSE, CAPILLARY
GLUCOSE-CAPILLARY: 178 mg/dL — AB (ref 65–99)
GLUCOSE-CAPILLARY: 188 mg/dL — AB (ref 65–99)
Glucose-Capillary: 250 mg/dL — ABNORMAL HIGH (ref 65–99)

## 2016-08-11 SURGERY — LAPAROSCOPIC CHOLECYSTECTOMY WITH INTRAOPERATIVE CHOLANGIOGRAM
Anesthesia: General | Site: Abdomen

## 2016-08-11 MED ORDER — DEXAMETHASONE SODIUM PHOSPHATE 10 MG/ML IJ SOLN
INTRAMUSCULAR | Status: DC | PRN
  Administered 2016-08-11: 5 mg via INTRAVENOUS

## 2016-08-11 MED ORDER — ONDANSETRON HCL 4 MG/2ML IJ SOLN
INTRAMUSCULAR | Status: AC
  Filled 2016-08-11: qty 2

## 2016-08-11 MED ORDER — PROPOFOL 10 MG/ML IV BOLUS
INTRAVENOUS | Status: AC
  Filled 2016-08-11: qty 20

## 2016-08-11 MED ORDER — IOPAMIDOL (ISOVUE-300) INJECTION 61%
INTRAVENOUS | Status: DC | PRN
  Administered 2016-08-11: 10 mL via INTRAVENOUS

## 2016-08-11 MED ORDER — LACTATED RINGERS IR SOLN
Status: DC | PRN
  Administered 2016-08-11: 1
  Administered 2016-08-11: 10:00:00

## 2016-08-11 MED ORDER — HYDROMORPHONE HCL 1 MG/ML IJ SOLN
INTRAMUSCULAR | Status: AC
  Filled 2016-08-11: qty 1

## 2016-08-11 MED ORDER — IOPAMIDOL (ISOVUE-300) INJECTION 61%
INTRAVENOUS | Status: AC
  Filled 2016-08-11: qty 50

## 2016-08-11 MED ORDER — MIDAZOLAM HCL 5 MG/5ML IJ SOLN
INTRAMUSCULAR | Status: DC | PRN
  Administered 2016-08-11: 2 mg via INTRAVENOUS

## 2016-08-11 MED ORDER — HYDROMORPHONE HCL 1 MG/ML IJ SOLN
0.2500 mg | INTRAMUSCULAR | Status: DC | PRN
  Administered 2016-08-11 (×4): 0.5 mg via INTRAVENOUS

## 2016-08-11 MED ORDER — CEFAZOLIN SODIUM-DEXTROSE 2-4 GM/100ML-% IV SOLN
INTRAVENOUS | Status: AC
  Filled 2016-08-11: qty 100

## 2016-08-11 MED ORDER — LIDOCAINE-EPINEPHRINE 1 %-1:100000 IJ SOLN
INTRAMUSCULAR | Status: DC | PRN
  Administered 2016-08-11: 10 mL
  Administered 2016-08-11: 5 mL

## 2016-08-11 MED ORDER — MIDAZOLAM HCL 2 MG/2ML IJ SOLN
INTRAMUSCULAR | Status: AC
  Filled 2016-08-11: qty 2

## 2016-08-11 MED ORDER — CEFAZOLIN SODIUM-DEXTROSE 2-4 GM/100ML-% IV SOLN
2.0000 g | INTRAVENOUS | Status: AC
  Administered 2016-08-11: 2 g via INTRAVENOUS

## 2016-08-11 MED ORDER — MEPERIDINE HCL 50 MG/ML IJ SOLN: 6.2500 mg | INTRAMUSCULAR | Status: DC | PRN

## 2016-08-11 MED ORDER — FENTANYL CITRATE (PF) 100 MCG/2ML IJ SOLN
INTRAMUSCULAR | Status: AC
  Filled 2016-08-11: qty 2

## 2016-08-11 MED ORDER — FENTANYL CITRATE (PF) 100 MCG/2ML IJ SOLN
INTRAMUSCULAR | Status: DC | PRN
  Administered 2016-08-11 (×2): 50 ug via INTRAVENOUS
  Administered 2016-08-11: 100 ug via INTRAVENOUS
  Administered 2016-08-11 (×4): 50 ug via INTRAVENOUS

## 2016-08-11 MED ORDER — LIDOCAINE-EPINEPHRINE (PF) 1 %-1:200000 IJ SOLN
INTRAMUSCULAR | Status: AC
  Filled 2016-08-11: qty 30

## 2016-08-11 MED ORDER — ESMOLOL HCL 100 MG/10ML IV SOLN
INTRAVENOUS | Status: DC | PRN
  Administered 2016-08-11: 10 mg via INTRAVENOUS

## 2016-08-11 MED ORDER — SUGAMMADEX SODIUM 200 MG/2ML IV SOLN
INTRAVENOUS | Status: DC | PRN
  Administered 2016-08-11: 300 mg via INTRAVENOUS

## 2016-08-11 MED ORDER — LIDOCAINE 2% (20 MG/ML) 5 ML SYRINGE
INTRAMUSCULAR | Status: AC
  Filled 2016-08-11: qty 5

## 2016-08-11 MED ORDER — LACTATED RINGERS IV SOLN
INTRAVENOUS | Status: DC | PRN
  Administered 2016-08-11: 07:00:00 via INTRAVENOUS

## 2016-08-11 MED ORDER — ROCURONIUM BROMIDE 50 MG/5ML IV SOSY
PREFILLED_SYRINGE | INTRAVENOUS | Status: AC
  Filled 2016-08-11: qty 5

## 2016-08-11 MED ORDER — OXYCODONE HCL 5 MG PO TABS: 5.0000 mg | ORAL_TABLET | ORAL | 0 refills | Status: DC | PRN

## 2016-08-11 MED ORDER — SUCCINYLCHOLINE CHLORIDE 20 MG/ML IJ SOLN
INTRAMUSCULAR | Status: DC | PRN
  Administered 2016-08-11: 100 mg via INTRAVENOUS

## 2016-08-11 MED ORDER — BUPIVACAINE HCL (PF) 0.25 % IJ SOLN
INTRAMUSCULAR | Status: DC | PRN
  Administered 2016-08-11: 10 mL
  Administered 2016-08-11: 5 mL

## 2016-08-11 MED ORDER — BUPIVACAINE HCL (PF) 0.25 % IJ SOLN
INTRAMUSCULAR | Status: AC
  Filled 2016-08-11: qty 30

## 2016-08-11 MED ORDER — SUCCINYLCHOLINE CHLORIDE 20 MG/ML IJ SOLN
INTRAMUSCULAR | Status: AC
  Filled 2016-08-11: qty 1

## 2016-08-11 MED ORDER — DEXAMETHASONE SODIUM PHOSPHATE 10 MG/ML IJ SOLN
INTRAMUSCULAR | Status: AC
  Filled 2016-08-11: qty 1

## 2016-08-11 MED ORDER — LABETALOL HCL 5 MG/ML IV SOLN
INTRAVENOUS | Status: AC
  Filled 2016-08-11: qty 4

## 2016-08-11 MED ORDER — ROCURONIUM BROMIDE 100 MG/10ML IV SOLN
INTRAVENOUS | Status: DC | PRN
  Administered 2016-08-11: 40 mg via INTRAVENOUS
  Administered 2016-08-11: 20 mg via INTRAVENOUS
  Administered 2016-08-11: 10 mg via INTRAVENOUS

## 2016-08-11 MED ORDER — PROMETHAZINE HCL 25 MG/ML IJ SOLN: 6.2500 mg | INTRAMUSCULAR | Status: DC | PRN

## 2016-08-11 MED ORDER — LIDOCAINE HCL (CARDIAC) 20 MG/ML IV SOLN
INTRAVENOUS | Status: DC | PRN
Start: 2016-08-11 — End: 2016-08-11
  Administered 2016-08-11: 100 mg via INTRAVENOUS

## 2016-08-11 MED ORDER — ONDANSETRON HCL 4 MG/2ML IJ SOLN
INTRAMUSCULAR | Status: DC | PRN
  Administered 2016-08-11: 4 mg via INTRAVENOUS

## 2016-08-11 MED ORDER — LABETALOL HCL 5 MG/ML IV SOLN
INTRAVENOUS | Status: DC | PRN
  Administered 2016-08-11: 2.5 mg via INTRAVENOUS

## 2016-08-11 MED ORDER — PROPOFOL 10 MG/ML IV BOLUS
INTRAVENOUS | Status: DC | PRN
  Administered 2016-08-11: 130 mg via INTRAVENOUS

## 2016-08-11 MED ORDER — CHLORHEXIDINE GLUCONATE CLOTH 2 % EX PADS: 6.0000 | MEDICATED_PAD | Freq: Once | CUTANEOUS | Status: DC

## 2016-08-11 SURGICAL SUPPLY — 42 items
ADH SKN CLS APL DERMABOND .7 (GAUZE/BANDAGES/DRESSINGS) ×1
APPLIER CLIP ROT 10 11.4 M/L (STAPLE) ×2
APR CLP MED LRG 11.4X10 (STAPLE) ×1
BAG SPEC RTRVL LRG 6X4 10 (ENDOMECHANICALS) ×1
CHLORAPREP W/TINT 26ML (MISCELLANEOUS) ×2 IMPLANT
CLIP APPLIE ROT 10 11.4 M/L (STAPLE) ×1 IMPLANT
CLIP LIGATING HEMO O LOK GREEN (MISCELLANEOUS) IMPLANT
COVER MAYO STAND STRL (DRAPES) ×1 IMPLANT
COVER SURGICAL LIGHT HANDLE (MISCELLANEOUS) ×2 IMPLANT
DECANTER SPIKE VIAL GLASS SM (MISCELLANEOUS) ×2 IMPLANT
DERMABOND ADVANCED (GAUZE/BANDAGES/DRESSINGS) ×1
DERMABOND ADVANCED .7 DNX12 (GAUZE/BANDAGES/DRESSINGS) IMPLANT
DRAPE C-ARM 42X120 X-RAY (DRAPES) ×1 IMPLANT
ELECT L-HOOK LAP 45CM DISP (ELECTROSURGICAL)
ELECT PENCIL ROCKER SW 15FT (MISCELLANEOUS) ×2 IMPLANT
ELECT REM PT RETURN 9FT ADLT (ELECTROSURGICAL) ×2
ELECTRODE L-HOOK LAP 45CM DISP (ELECTROSURGICAL) IMPLANT
ELECTRODE REM PT RTRN 9FT ADLT (ELECTROSURGICAL) ×1 IMPLANT
GLOVE BIO SURGEON STRL SZ 6 (GLOVE) ×2 IMPLANT
GLOVE INDICATOR 6.5 STRL GRN (GLOVE) ×6 IMPLANT
GOWN STRL REUS W/TWL 2XL LVL3 (GOWN DISPOSABLE) ×2 IMPLANT
GOWN STRL REUS W/TWL XL LVL3 (GOWN DISPOSABLE) ×4 IMPLANT
HEMOSTAT SNOW SURGICEL 2X4 (HEMOSTASIS) IMPLANT
IRRIG SUCT STRYKERFLOW 2 WTIP (MISCELLANEOUS) ×2
IRRIGATION SUCT STRKRFLW 2 WTP (MISCELLANEOUS) ×1 IMPLANT
KIT BASIN OR (CUSTOM PROCEDURE TRAY) ×2 IMPLANT
L-HOOK LAP DISP 36CM (ELECTROSURGICAL)
LHOOK LAP DISP 36CM (ELECTROSURGICAL) IMPLANT
POSITIONER SURGICAL ARM (MISCELLANEOUS) IMPLANT
POUCH SPECIMEN RETRIEVAL 10MM (ENDOMECHANICALS) ×2 IMPLANT
SCISSORS LAP 5X35 DISP (ENDOMECHANICALS) ×2 IMPLANT
SET CHOLANGIOGRAPH MIX (MISCELLANEOUS) ×1 IMPLANT
SLEEVE XCEL OPT CAN 5 100 (ENDOMECHANICALS) ×2 IMPLANT
SUT MNCRL AB 4-0 PS2 18 (SUTURE) ×2 IMPLANT
TAPE CLOTH 4X10 WHT NS (GAUZE/BANDAGES/DRESSINGS) IMPLANT
TOWEL OR 17X26 10 PK STRL BLUE (TOWEL DISPOSABLE) ×2 IMPLANT
TOWEL OR NON WOVEN STRL DISP B (DISPOSABLE) ×2 IMPLANT
TRAY LAPAROSCOPIC (CUSTOM PROCEDURE TRAY) ×2 IMPLANT
TROCAR BLADELESS OPT 5 100 (ENDOMECHANICALS) ×2 IMPLANT
TROCAR XCEL BLUNT TIP 100MML (ENDOMECHANICALS) ×2 IMPLANT
TROCAR XCEL NON-BLD 11X100MML (ENDOMECHANICALS) ×2 IMPLANT
TUBING INSUF HEATED (TUBING) IMPLANT

## 2016-08-11 NOTE — H&P (Signed)
Kara Kelley 07/25/2016 9:28 AM Location: [facility] Patient #: [id number] DOB: 1956-10-09 Single / Language: [place] / Race: Black or African American Female   History of Present Illness [name]; 07/25/2016 10:49 AM) The patient is a 60 year old female who presents for evaluation of gall stones. Pt is sent by Dr. Madilyn Fireman for consultation regarding a diagnosis of gallstones and possible cholecystitis. The patient was diagnosed with gallstones around a year ago when she was having some right-sided abdominal pain. At that time, she was also having sounded like radicular pain from her back. She was not having other GI symptoms and the abdominal pain was self-limited. However, over the past few months she has started having increasing right-sided abdominal pain. She is having some severe attacks but in between, she is not pain-free. She has chronic soreness in the right midabdomen. She has nausea all of the time but no vomiting. She has had a decreased appetite. She denies constipation or diarrhea. She does have some belching. She denies bloating. She has not had any family history of gallbladder disease of which she is aware.  RUQ u/s IMPRESSION: 1. Contracted gallbladder filled with stones and exhibiting a positive sonographic Murphy's sign. This is consistent with acute cholecystitis. 2. No abnormality observed elsewhere within the abdomen. 3. These results will be called to the ordering clinician or representative by the Radiologist Assistant, and communication documented in the PACS or zVision Dashboard.   Other Problems [name], [name]; 07/25/2016 9:28 AM) Anxiety Disorder Asthma Back Pain Cholelithiasis Chronic Obstructive Lung Disease Depression Diabetes Mellitus Gastroesophageal Reflux Disease Hepatitis High blood pressure Migraine Headache  Past Surgical History [name], [name]; 07/25/2016 9:28 AM) Cesarean Section -  Multiple Hip Surgery Bilateral.  Diagnostic Studies History [name], [name]; 07/25/2016 9:28 AM) Colonoscopy 1-5 years ago Mammogram 1-3 years ago Pap Smear 1-5 years ago  Allergies [name], [name]; 07/25/2016 9:28 AM) No Known Drug Allergies10/16/2017  Medication History [name], [name]; 07/25/2016 9:31 AM) Abilify (10MG  Tablet, Oral) Active. Albuterol Sulfate HFA (108 (90 Base)MCG/ACT Aerosol Soln, Inhalation) Active. Accu-Chek SmartView (In Vitro) Active. BuPROPion HCl ER (XL) (300MG  Tablet ER 24HR, Oral) Active. Gabapentin (300MG  Tablet, Oral) Active. HydrOXYzine HCl (25MG  Tablet, Oral) Active. NovoLOG FlexPen (100UNIT/ML Soln Pen-inj, Subcutaneous) Active. Levemir FlexTouch (100UNIT/ML Soln Pen-inj, Subcutaneous) Active. Naproxen (500MG  Tablet, Oral) Active. MiraLax (Oral) Active. Spironolactone (50MG  Tablet, Oral) Active. Oxycodone-Acetaminophen (10-325MG  Tablet, Oral) Active. ARIPiprazole (5MG  Tablet, Oral) Active. Zolpidem Tartrate (10MG  Tablet, Oral) Active. Medications Reconciled  Social History [name], [place]; 07/25/2016 9:28 AM) Alcohol use Recently quit alcohol use. Tobacco use Current some day smoker.  Family History [name], [place]; 07/25/2016 9:28 AM) Alcohol Abuse Brother, Daughter, Father. Hypertension Mother.  Pregnancy / Birth History [name], [name]; 07/25/2016 9:28 AM) Age at menarche 16 years. Contraceptive History Oral contraceptives. Maternal age 42-20    Review of Systems [name] [name]; 07/25/2016 9:28 AM) Skin Present- Dryness. Not Present- Change in Wart/Mole, Hives, Jaundice, New Lesions, Non-Healing Wounds, Rash and Ulcer. HEENT Present- Wears glasses/contact lenses. Not Present- Earache, Hearing Loss, Hoarseness, Nose Bleed, Oral Ulcers, Ringing in the Ears, Seasonal Allergies, Sinus Pain, Sore Throat, Visual Disturbances and Yellow Eyes. Gastrointestinal Present- Abdominal Pain and Constipation.  Not Present- Bloating, Bloody Stool, Change in Bowel Habits, Chronic diarrhea, Difficulty Swallowing, Excessive gas, Gets full quickly at meals, Hemorrhoids, Indigestion, Nausea, Rectal Pain and Vomiting. Female Genitourinary Present- Nocturia. Not Present- Frequency, Painful Urination, Pelvic Pain and Urgency. Musculoskeletal Present- Back Pain. Not  Present- Joint Pain, Joint Stiffness, Muscle Pain, Muscle Weakness and Swelling of Extremities. Endocrine Present- Hot flashes. Not Present- Cold Intolerance, Excessive Hunger, Hair Changes, Heat Intolerance and New Diabetes.  Vitals [name] [name]; 07/25/2016 9:31 AM) 07/25/2016 9:31 AM Weight: 205 lb Height: 60in Body Surface Area: 1.89 m Body Mass Index: 40.04 kg/m  Temp.: 97.56F(Temporal)  Pulse: 68 (Regular)  BP: 132/74 (Sitting, Left Arm, Standard)       Physical Exam [name]; 07/25/2016 10:51 AM) General Mental Status-Alert. General Appearance-Consistent with stated age. Hydration-Well hydrated. Voice-Normal.  Head and Neck Head-normocephalic, atraumatic with no lesions or palpable masses. Trachea-midline. Thyroid Gland Characteristics - normal size and consistency.  Eye Eyeball - Bilateral-Extraocular movements intact. Sclera/Conjunctiva - Bilateral-No scleral icterus.  Chest and Lung Exam Chest and lung exam reveals -quiet, even and easy respiratory effort with no use of accessory muscles and on auscultation, normal breath sounds, no adventitious sounds and normal vocal resonance. Inspection Chest Wall - Normal. Back - normal.  Cardiovascular Cardiovascular examination reveals -normal heart sounds, regular rate and rhythm with no murmurs and normal pedal pulses bilaterally.  Abdomen Inspection Inspection of the abdomen reveals - No Hernias. Palpation/Percussion Palpation and Percussion of the abdomen reveal - Soft, No Rebound tenderness, No Rigidity (guarding) and No  hepatosplenomegaly. Note: mild RUQ tenderness. Auscultation Auscultation of the abdomen reveals - Bowel sounds normal.  Neurologic Neurologic evaluation reveals -alert and oriented x 3 with no impairment of recent or remote memory. Mental Status-Normal.  Musculoskeletal Global Assessment -Note: no gross deformities.  Normal Exam - Left-Upper Extremity Strength Normal and Lower Extremity Strength Normal. Normal Exam - Right-Upper Extremity Strength Normal and Lower Extremity Strength Normal.  Lymphatic Head & Neck  General Head & Neck Lymphatics: Bilateral - Description - Normal. Axillary  General Axillary Region: Bilateral - Description - Normal. Tenderness - Non Tender. Femoral & Inguinal  Generalized Femoral & Inguinal Lymphatics: Bilateral - Description - No Generalized lymphadenopathy.    Assessment & Plan [name]; 07/25/2016 10:54 AM) CHRONIC CHOLECYSTITIS WITH CALCULUS (K80.10) Impression: Pt has classic symptoms at this point of gallbladder disease. She appears to have chronic cholecystitis based on imaging, symptoms, and exam.  We will plan lap chole.  The surgical procedure was described to the patient in detail. The patient was given educational material. I discussed the incision type and location, the location of the gallbladder, the anatomy of the bile ducts and arteries, and the typical progression of surgery. I discussed the possibility of converting to an open operation. I advised of the risks of bleeding, infection, damage to other structures (such as the bile duct, intestine or liver), bile leak, need for other procedures or surgeries, and post op diarrhea/constipation. We discussed the risk of blood clot. We discussed the recovery period and post operative restrictions. The patient was advised against taking blood thinners the week before surgery. Current Plans You are being scheduled for surgery - Our schedulers will call you.  You should  hear from our office's scheduling department within 5 working days about the location, date, and time of surgery. We try to make accommodations for patient's preferences in scheduling surgery, but sometimes the OR schedule or the surgeon's schedule prevents [place] from making those accommodations.  If you have not heard from our office [phone] in 5 working days, call the office and ask for your surgeon's nurse.  If you have other questions about your diagnosis, plan, or surgery, call the office and ask for your surgeon's nurse.  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  I discussed that sometimes surgical pain is difficult to control when patients are on daily narcotic use. I would plan to use straight oxycodone for post op pain control to minimize tylenol overdose. TOBACCO USE (Z72.0) Impression: Patient smokes fewer than 1/2 pack per week. I discussed that eliminating smoking would minimize her risk for pneumonia and wound complications like infections and hernias.    Signed by Almond Lint, MD (07/25/2016 10:57 AM)

## 2016-08-11 NOTE — Op Note (Signed)
Laparoscopic Cholecystectomy with IOC Procedure Note  Indications: This patient presents with chronic cholecystitis and will undergo laparoscopic cholecystectomy.  Pre-operative Diagnosis: chronic calculous cholecystitis  Post-operative Diagnosis: Same  Surgeon: Almond LintBYERLY,Mekesha Solomon   Assistants: Darnell LevelGerkin, Todd  Anesthesia: General endotracheal anesthesia and local  ASA Class: 3  Procedure Details  The patient was seen again in the Holding Room. The risks, benefits, complications, treatment options, and expected outcomes were discussed with the patient. The possibilities of  bleeding, recurrent infection, damage to nearby structures, the need for additional procedures, failure to diagnose a condition, the possible need to convert to an open procedure, and creating a complication requiring transfusion or operation were discussed with the patient. The likelihood of improving the patient's symptoms with return to their baseline status is good.    The patient and/or family concurred with the proposed plan, giving informed consent. The site of surgery properly noted. The patient was taken to Operating Room, and the procedure verified as Laparoscopic Cholecystectomy with Intraoperative Cholangiogram. A Time Out was held and the above information confirmed.  Prior to the induction of general anesthesia, antibiotic prophylaxis was administered. General endotracheal anesthesia was then administered and tolerated well. After the induction, the abdomen was prepped with Chloraprep and draped in the sterile fashion. The patient was positioned in the supine position.  Local anesthetic agent was injected into the skin near the umbilicus and an incision made. We dissected down to the abdominal fascia with blunt dissection.  The fascia was incised vertically and we entered the peritoneal cavity bluntly.  A pursestring suture of 0-Vicryl was placed around the fascial opening.  The [facility] cannula was inserted and secured  with the stay suture.  Pneumoperitoneum was then created with CO2 and tolerated well without any adverse changes in the patient's vital signs. An 11-mm port was placed in the subxiphoid position.  Two 5-mm ports were placed in the right upper quadrant. All skin incisions were infiltrated with a local anesthetic agent before making the incision and placing the trocars.   We positioned the patient in reverse Trendelenburg, tilted slightly to the patient's left.  The gallbladder was identified, the fundus grasped and retracted cephalad. Adhesions were lysed bluntly and with the electrocautery where indicated, taking care not to injure any adjacent organs or viscus. The infundibulum was grasped and retracted laterally, exposing the peritoneum overlying the triangle of Calot. This was then divided and exposed in a blunt fashion. A critical view of the cystic duct and cystic artery was obtained.  The cystic duct was clearly identified and bluntly dissected circumferentially. The cystic duct was ligated with a clip distally.   An incision was made in the cystic duct and the [facility] cholangiogram catheter introduced. The catheter was secured using a clip. A cholangiogram was then attempted, but the catheter was kinked.  This was repositioned and a cholangiogram showed a long cystic duct, a common bile duct, and filling into the duodenum.  No filling defects were seen.  The cystic duct was then ligated with clips and divided. The cystic artery was identified, dissected free, ligated with clips and divided as well.   The gallbladder was dissected from the liver bed in retrograde fashion with the electrocautery. The gallbladder was removed and placed in an Endocatch bag.  The gallbladder and Endocatch bag were then removed through the umbilical port site.  The liver bed was irrigated and inspected. Hemostasis was achieved with the electrocautery. Copious irrigation was utilized and was repeatedly aspirated until clear.  We again inspected the right upper quadrant for hemostasis.  Pneumoperitoneum was released as we removed the trocars.   The pursestring suture was used to close the umbilical fascia.  4-0 Monocryl was used to close the skin.   The skin was cleaned and dry, and Dermabond was applied. The patient was then extubated and brought to the recovery room in stable condition. Instrument, sponge, and needle counts were correct at closure and at the conclusion of the case.   Findings: Intrahepatic gallbladder.  Tapered infundibulum and long cystic duct.  Chronic inflammation.    Estimated Blood Loss: min          Specimens: Gallbladder to pathology       Complications: None; patient tolerated the procedure well.         Disposition: PACU - hemodynamically stable.         Condition: stable

## 2016-08-11 NOTE — Transfer of Care (Signed)
Immediate Anesthesia Transfer of Care Note  Patient: Kara Kelley  Procedure(s) Performed: Procedure(s): LAPAROSCOPIC CHOLECYSTECTOMY WITH   INTRAOPERATIVE CHOLANGIOGRAM (N/A)  Patient Location: PACU  Anesthesia Type:General  Level of Consciousness:  sedated, patient cooperative and responds to stimulation  Airway & Oxygen Therapy:Patient Spontanous Breathing and Patient connected to face mask oxgen  Post-op Assessment:  Report given to PACU RN and Post -op Vital signs reviewed and stable  Post vital signs:  Reviewed and stable  Last Vitals:  Vitals:   08/11/16 0601  BP: 129/84  Pulse: 95  Resp: 18  Temp: 36.7 C    Complications: No apparent anesthesia complications

## 2016-08-11 NOTE — Interval H&P Note (Signed)
History and Physical Interval Note:  08/11/2016 7:44 AM  Kara Kelley  has presented today for surgery, with the diagnosis of chronic calculous cholecystitis  The various methods of treatment have been discussed with the patient and family. After consideration of risks, benefits and other options for treatment, the patient has consented to  Procedure(s): LAPAROSCOPIC CHOLECYSTECTOMY WITH POSSIBLE  INTRAOPERATIVE CHOLANGIOGRAM (N/A) as a surgical intervention .  The patient's history has been reviewed, patient examined, no change in status, stable for surgery.  I have reviewed the patient's chart and labs.  Questions were answered to the patient's satisfaction.     [name]

## 2016-08-11 NOTE — Progress Notes (Signed)
  August 11, 2016  Patient: Kara Kelley  Date of Birth: Jul 22, 1956  Date of Visit: 07/27/2016    To Whom It May Concern:  Tiffany (daughter) was with Rudean HittPauline Morin today for her surgery.  Sincerely,

## 2016-08-11 NOTE — Progress Notes (Signed)
Incentive spirometer given to pt

## 2016-08-11 NOTE — Anesthesia Procedure Notes (Signed)
Procedure Name: Intubation Date/Time: 08/11/2016 7:56 AM Performed by: Wynonia SoursWALKER, Frederich Montilla L Pre-anesthesia Checklist: Patient identified, Emergency Drugs available, Suction available, Patient being monitored and Timeout performed Patient Re-evaluated:Patient Re-evaluated prior to inductionOxygen Delivery Method: Circle system utilized Preoxygenation: Pre-oxygenation with 100% oxygen Intubation Type: IV induction Ventilation: Mask ventilation without difficulty Laryngoscope Size: Mac and 4 Grade View: Grade II Tube type: Oral Tube size: 7.0 mm Number of attempts: 1 Airway Equipment and Method: Stylet Placement Confirmation: ETT inserted through vocal cords under direct vision,  positive ETCO2,  CO2 detector and breath sounds checked- equal and bilateral Secured at: 22 cm Tube secured with: Tape Dental Injury: Teeth and Oropharynx as per pre-operative assessment  Comments: DL slightly difficult due to large tongue and large amount of hair on scalp.

## 2016-08-11 NOTE — Anesthesia Preprocedure Evaluation (Signed)
Anesthesia Evaluation  Patient identified by MRN, date of birth, ID band Patient awake    Reviewed: Allergy & Precautions, NPO status , Patient's Chart, lab work & pertinent test results  Airway Mallampati: II  TM Distance: >3 FB Neck ROM: Full    Dental no notable dental hx.    Pulmonary asthma , former smoker,    Pulmonary exam normal breath sounds clear to auscultation       Cardiovascular hypertension, Normal cardiovascular exam Rhythm:Regular Rate:Normal     Neuro/Psych negative neurological ROS  negative psych ROS   GI/Hepatic GERD  ,(+) Hepatitis -, C  Endo/Other  negative endocrine ROSdiabetes  Renal/GU negative Renal ROS     Musculoskeletal negative musculoskeletal ROS (+)   Abdominal   Peds  Hematology negative hematology ROS (+) anemia ,   Anesthesia Other Findings   Reproductive/Obstetrics negative OB ROS                             Anesthesia Physical Anesthesia Plan  ASA: III  Anesthesia Plan: General   Post-op Pain Management:    Induction: Intravenous  Airway Management Planned: Oral ETT  Additional Equipment:   Intra-op Plan:   Post-operative Plan: Extubation in OR  Informed Consent: I have reviewed the patients History and Physical, chart, labs and discussed the procedure including the risks, benefits and alternatives for the proposed anesthesia with the patient or authorized representative who has indicated his/her understanding and acceptance.   Dental advisory given  Plan Discussed with: CRNA  Anesthesia Plan Comments:         Anesthesia Quick Evaluation

## 2016-08-11 NOTE — Anesthesia Postprocedure Evaluation (Signed)
Anesthesia Post Note  Patient: Kara Kelley  Procedure(s) Performed: Procedure(s) (LRB): LAPAROSCOPIC CHOLECYSTECTOMY WITH   INTRAOPERATIVE CHOLANGIOGRAM (N/A)  Patient location during evaluation: PACU Anesthesia Type: General Level of consciousness: sedated and patient cooperative Pain management: pain level controlled Vital Signs Assessment: post-procedure vital signs reviewed and stable Respiratory status: spontaneous breathing Cardiovascular status: stable Anesthetic complications: no    Last Vitals:  Vitals:   08/11/16 0945 08/11/16 1000  BP: (!) 156/116 (!) 150/94  Pulse: 65 72  Resp: (!) 22 19  Temp:      Last Pain:  Vitals:   08/11/16 1000  TempSrc:   PainSc: 7                  Lewie LoronJohn Sade Mehlhoff

## 2016-08-11 NOTE — Discharge Instructions (Addendum)
General Anesthesia, Adult, Care After Refer to this sheet in the next few weeks. These instructions provide you with information on caring for yourself after your procedure. Your health care provider may also give you more specific instructions. Your treatment has been planned according to current medical practices, but problems sometimes occur. Call your health care provider if you have any problems or questions after your procedure. WHAT TO EXPECT AFTER THE PROCEDURE After the procedure, it is typical to experience:  Sleepiness.  Nausea and vomiting. HOME CARE INSTRUCTIONS  For the first 24 hours after general anesthesia:  Have a responsible person with you.  Do not drive a car. If you are alone, do not take public transportation.  Do not drink alcohol.  Do not take medicine that has not been prescribed by your health care provider.  Do not sign important papers or make important decisions.  You may resume a normal diet and activities as directed by your health care provider.  Change bandages (dressings) as directed.  If you have questions or problems that seem related to general anesthesia, call the hospital and ask for the anesthetist or anesthesiologist on call. SEEK MEDICAL CARE IF:  You have nausea and vomiting that continue the day after anesthesia.  You develop a rash. SEEK IMMEDIATE MEDICAL CARE IF:   You have difficulty breathing.  You have chest pain.  You have any allergic problems.   This information is not intended to replace advice given to you by your health care provider. Make sure you discuss any questions you have with your health care provider.   Document Released: 01/02/2001 Document Revised: 10/17/2014 Document Reviewed: 01/25/2012 Elsevier Interactive Patient Education 2016 [employer] [facility] Office Phone Number [phone]   POST OP INSTRUCTIONS  Always review your discharge instruction sheet given to you by the  facility where your surgery was performed.  IF YOU HAVE DISABILITY OR FAMILY LEAVE FORMS, YOU MUST BRING THEM TO THE OFFICE FOR PROCESSING.  DO NOT GIVE THEM TO YOUR DOCTOR.  1. A prescription for pain medication may be given to you upon discharge.  Take your pain medication as prescribed, if needed.  If narcotic pain medicine is not needed, then you may take acetaminophen (Tylenol) or ibuprofen (Advil) as needed. 2. Take your usually prescribed medications unless otherwise directed 3. If you need a refill on your pain medication, please contact your pharmacy.  They will contact our office to request authorization.  Prescriptions will not be filled after 5pm or on week-ends. 4. You should eat very light the first 24 hours after surgery, such as soup, crackers, pudding, etc.  Resume your normal diet the day after surgery 5. It is common to experience some constipation if taking pain medication after surgery.  Increasing fluid intake and taking a stool softener will usually help or prevent this problem from occurring.  A mild laxative (Milk of Magnesia or Miralax) should be taken according to package directions if there are no bowel movements after 48 hours. 6. You may shower in 48 hours.  The surgical glue will flake off in 2-3 weeks.   7. ACTIVITIES:  No strenuous activity or heavy lifting for 1 week.   a. You may drive when you no longer are taking prescription pain medication, you can comfortably wear a seatbelt, and you can safely maneuver your car and apply brakes. b. RETURN TO WORK:  __________to be determined._______________ Bonita QuinYou should see your doctor in the office for a follow-up appointment approximately three-four  weeks after your surgery.    WHEN TO CALL YOUR DOCTOR: 1. Fever over 101.0 2. Nausea and/or vomiting. 3. Extreme swelling or bruising. 4. Continued bleeding from incision. 5. Increased pain, redness, or drainage from the incision.  The clinic staff is available to answer your  questions during regular business hours.  Please dont hesitate to call and ask to speak to one of the nurses for clinical concerns.  If you have a medical emergency, go to the nearest emergency room or call 911.  [facility] is always on call at the hospital.  For further questions, please visit [email or web address]

## 2016-09-22 ENCOUNTER — Encounter: Payer: Self-pay | Admitting: *Deleted

## 2016-10-08 ENCOUNTER — Encounter (HOSPITAL_COMMUNITY): Payer: Self-pay | Admitting: Emergency Medicine

## 2016-10-08 ENCOUNTER — Emergency Department (HOSPITAL_COMMUNITY): Payer: Medicaid Other

## 2016-10-08 ENCOUNTER — Emergency Department (HOSPITAL_COMMUNITY)
Admission: EM | Admit: 2016-10-08 | Discharge: 2016-10-08 | Disposition: A | Discharge status: Home / Self Care | Payer: Medicaid Other | Attending: Emergency Medicine | Admitting: Emergency Medicine

## 2016-10-08 DIAGNOSIS — M25552 Pain in left hip: Secondary | ICD-10-CM | POA: Diagnosis not present

## 2016-10-08 DIAGNOSIS — E109 Type 1 diabetes mellitus without complications: Secondary | ICD-10-CM | POA: Insufficient documentation

## 2016-10-08 DIAGNOSIS — J45909 Unspecified asthma, uncomplicated: Secondary | ICD-10-CM | POA: Diagnosis not present

## 2016-10-08 DIAGNOSIS — M25562 Pain in left knee: Secondary | ICD-10-CM | POA: Diagnosis present

## 2016-10-08 DIAGNOSIS — I10 Essential (primary) hypertension: Secondary | ICD-10-CM | POA: Diagnosis not present

## 2016-10-08 DIAGNOSIS — W19XXXA Unspecified fall, initial encounter: Secondary | ICD-10-CM

## 2016-10-08 DIAGNOSIS — Z87891 Personal history of nicotine dependence: Secondary | ICD-10-CM | POA: Insufficient documentation

## 2016-10-08 MED ORDER — KETOROLAC TROMETHAMINE 60 MG/2ML IM SOLN
60.0000 mg | Freq: Once | INTRAMUSCULAR | Status: AC
  Administered 2016-10-08: 60 mg via INTRAMUSCULAR
  Filled 2016-10-08: qty 2

## 2016-10-08 MED ORDER — LIDOCAINE 5 % EX PTCH: 1.0000 | MEDICATED_PATCH | CUTANEOUS | 0 refills | Status: DC

## 2016-10-08 MED ORDER — MELOXICAM 7.5 MG PO TABS: 7.5000 mg | ORAL_TABLET | Freq: Every day | ORAL | 0 refills | Status: DC

## 2016-10-08 MED ORDER — HYDROCODONE-ACETAMINOPHEN 5-325 MG PO TABS: 1.0000 | ORAL_TABLET | ORAL | 0 refills | Status: DC | PRN

## 2016-10-08 NOTE — Discharge Instructions (Signed)
Take mobic daily.  Reserve vicodin for night time or if pain is severe.   Follow-up with your primary care doctor. Return to the ED for new or worsening symptoms.

## 2016-10-08 NOTE — ED Triage Notes (Signed)
Pt reports falling on her knee 3 days ago. Pt reports pain on her left side all the way up her leg into her hip. Pain 10/10. Pt able to bear weight but has tried to stay off of it.Pt denies blood thinners, did not hit head, no LOC.

## 2016-10-08 NOTE — ED Provider Notes (Signed)
[facility] DEPT Provider Note   CSN: [id number] Arrival date & time: 10/08/16  1143  By signing my name below, I, Higinio Plan, attest that this documentation has been prepared under the direction and in the presence of [name], PA-C.  Electronically Signed: Higinio Plan, ED Scribe. 10/08/16. 1:04 PM.  History   Chief Complaint Chief Complaint  Patient presents with  . Fall   The history is provided by the patient. No language interpreter was used.   HPI Comments: Kara Kelley is a 60 y.o. female with PMHx of DM, HTN, arthritis, chronic pain and substance abuse, who presents to the Emergency Department complaining of gradually worsening, 10/10, left leg pain s/p a fall that occurred 3 days ago. Pt reports she was walking on concrete 3 days ago when her left leg suddenly "gave out on her," causing her to fall onto her left knee. She denies any head injury or loss of consciousness after her fall. Pt now complains of left leg pain that begins in her left lower leg and radiates upwards into her left hip. She notes associated difficulty sleeping due to pain. She states she has applied heat to her left leg and taken Aleve with no relief. She denies pain in her right leg and PSHx to her left leg.   Past Medical History:  Diagnosis Date  . Anemia   . Arthritis   . Asthma   . Bunion   . Callus   . Chronic pain   . Cocaine abuse   . Corns and callosities   . Degenerative joint disease   . Depression   . Diabetes mellitus   . ETOH abuse   . Gall stones   . GERD (gastroesophageal reflux disease)   . Hepatitis C   . Hyperlipidemia   . Hypertension   . Substance abuse    alcoholism    Patient Active Problem List   Diagnosis Date Noted  . Chronic hepatitis C without hepatic coma ([place]) 12/09/2015  . Inclusion cyst 09/16/2014  . Onychomycosis 08/15/2014  . Type I (juvenile type) diabetes mellitus with renal manifestations, not stated as uncontrolled(250.41) 06/10/2014  .  Unspecified constipation 05/22/2014  . Esophageal reflux 05/22/2014  . Pain in lower limb 01/29/2014  . Eschar of multiple sites 07/31/2013  . Porokeratosis 07/02/2013  . Cyst of joint of ankle or foot 06/04/2013  . Status post foot surgery 02/06/2013  . Pain in joint, ankle and foot 01/23/2013  . Callus of foot 01/23/2013  . Ingrown nail 01/23/2013  . Deformity of metatarsal 01/23/2013    Past Surgical History:  Procedure Laterality Date  . CESAREAN SECTION     x3  . CHOLECYSTECTOMY N/A 08/11/2016   Procedure: LAPAROSCOPIC CHOLECYSTECTOMY WITH   INTRAOPERATIVE CHOLANGIOGRAM;  Surgeon: Stark Klein, MD;  Location: [facility] ORS;  Service: General;  Laterality: N/A;  . COLONOSCOPY    . Cotton Osteotomy w/ Graft Left 06/18/2009  . Excision of Benign Lesion Right 01/30/2013   Rt Plantar  . FOOT SURGERY    . HAMMER TOE REPAIR Right 02/12/2016   RIGHT #5  . Hammertoe Repair Left 06/18/2009   Lt #5  . METATARSAL OSTEOTOMY Left 06/18/2009   #5  . Nail Matrixectomy Left 06/18/2009   LT #1  . OSTEOTOMY Right 01/30/2013   Rt #5  . Phalangectomy Left 06/18/2009   LT #1  . Phalangectomy Right 01/30/2013   Rt #1    OB History    Gravida Para Term Preterm AB Living  _0 SAB TAB Ectopic Multiple Live Births     1           Home Medications    Prior to Admission medications   Medication Sig Start Date End Date Taking? Authorizing Provider  albuterol (PROVENTIL HFA;VENTOLIN HFA) 108 (90 BASE) MCG/ACT inhaler Inhale 2 puffs into the lungs every 6 (six) hours as needed. For shortness of breath.    Historical Provider, MD  ARIPiprazole (ABILIFY) 5 MG tablet Take 5 mg by mouth daily. 07/21/16   Historical Provider, MD  Blood Glucose Monitoring Suppl (ACCU-CHEK AVIVA PLUS) W/DEVICE KIT 1 Device by Does not apply route once. 06/10/14   Renato Shin, MD  buPROPion (WELLBUTRIN XL) 300 MG 24 hr tablet Take 300 mg by mouth every morning. 05/14/15   Historical Provider, MD  glucose blood (ACCU-CHEK  AVIVA) test strip 1 each by Other route 2 (two) times daily. And lancets 2/day 250.43 06/10/14   Renato Shin, MD  LEVEMIR FLEXTOUCH 100 UNIT/ML Pen Inject 55 Units into the skin at bedtime. 07/20/16   Historical Provider, MD  NOVOLOG FLEXPEN 100 UNIT/ML FlexPen Inject 18 Units into the skin 3 (three) times daily with meals. 07/20/16   Historical Provider, MD  omeprazole (PRILOSEC) 40 MG capsule Take 40 mg by mouth daily. 07/19/16   Historical Provider, MD  oxyCODONE (OXY IR/ROXICODONE) 5 MG immediate release tablet Take 1-2 tablets (5-10 mg total) by mouth every 4 (four) hours as needed for moderate pain, severe pain or breakthrough pain. 08/11/16   Stark Klein, MD  oxyCODONE-acetaminophen (PERCOCET) 10-325 MG tablet Take 1 tablet by mouth 4 (four) times daily as needed. For pain. 07/19/16   Historical Provider, MD  pregabalin (LYRICA) 75 MG capsule Take 75 mg by mouth 2 (two) times daily.    Historical Provider, MD  spironolactone (ALDACTONE) 50 MG tablet Take 50 mg by mouth daily.    Historical Provider, MD  traZODone (DESYREL) 100 MG tablet Take 100 mg by mouth at bedtime. 05/18/15   Historical Provider, MD  zolpidem (AMBIEN) 10 MG tablet Take 10 mg by mouth at bedtime as needed. 07/21/16   Historical Provider, MD    Family History Family History  Problem Relation Age of Onset  . Heart disease Mother   . Diabetes      mat great aunt  . Cirrhosis      mat great aunt  . Cirrhosis      mat great uncles x 2  . Colon cancer Neg Hx     Social History Social History  Substance Use Topics  . Smoking status: Former Smoker    Packs/day: 0.50    Types: Cigarettes    Quit date: 12/10/2015  . Smokeless tobacco: Never Used  . Alcohol use No     Comment: in rehab     Allergies   Patient has no known allergies.  Review of Systems Review of Systems  Musculoskeletal: Positive for arthralgias.  Neurological: Negative for syncope.  Psychiatric/Behavioral: Positive for sleep disturbance (due to  pain).  All other systems reviewed and are negative.  Physical Exam Updated Vital Signs BP (!) 159/104 (BP Location: Left Arm) Comment: Pt states not taking BP meds this am  Pulse 87   Temp 98 F (36.7 C) (Oral)   Resp 22   Ht 5' (1.524 m)   Wt 200 lb 8 oz (90.9 kg)   SpO2 97%   BMI 39.16 kg/m   Physical  Exam  Constitutional: She is oriented to person, place, and time. She appears well-developed and well-nourished.  HENT:  Head: Normocephalic and atraumatic.  Mouth/Throat: Oropharynx is clear and moist.  Eyes: Conjunctivae and EOM are normal. Pupils are equal, round, and reactive to light.  Neck: Normal range of motion.  Cardiovascular: Normal rate, regular rhythm and normal heart sounds.   Pulmonary/Chest: Effort normal and breath sounds normal.  Abdominal: Soft. Bowel sounds are normal.  Musculoskeletal: Normal range of motion.  Left leg overall normal in appearance without bruising or bony deformity, no leg shortening, normal range of motion of the left hip and knee but with some noted pain, no pain with range of motion of the ankle or toes. Leg is neurovascularly intact, patient has been ambulatory to the restroom here in the ED without assistance  Neurological: She is alert and oriented to person, place, and time.  Skin: Skin is warm and dry.  Psychiatric: She has a normal mood and affect.  Nursing note and vitals reviewed.  ED Treatments / Results  Labs (all labs ordered are listed, but only abnormal results are displayed) Labs Reviewed - No data to display  EKG  EKG Interpretation None       Radiology Dg Knee Complete 4 Views Left  Result Date: 10/08/2016 CLINICAL DATA:  [place] onto left knee three days ago, persistent pain on left knee and left hip; no previous injuries to the hip nor to the knee EXAM: LEFT KNEE - COMPLETE 4+ VIEW COMPARISON:  Pelvic radiograph 10/08/2016 FINDINGS: There is joint space narrowing in the medial compartment. No evidence acute  fracture dislocation. No joint effusion. IMPRESSION: Osteoarthritis of the medial compartment.  No acute findings. Electronically Signed   By: Suzy Bouchard M.D.   On: 10/08/2016 13:40   Dg Hip Unilat W Or W/o Pelvis 2-3 Views Left  Result Date: 10/08/2016 CLINICAL DATA:  [place] onto left knee three days ago, persistent pain on left knee and left hip; no previous injuries to the hip nor to the knee EXAM: DG HIP (WITH OR WITHOUT PELVIS) 2-3V LEFT COMPARISON:  None. FINDINGS: There is no evidence of hip fracture or dislocation. There is no evidence of arthropathy or other focal bone abnormality. IMPRESSION: Negative. Electronically Signed   By: Lajean Manes M.D.   On: 10/08/2016 13:37   Procedures Procedures (including critical care time)  Medications Ordered in ED Medications - No data to display  DIAGNOSTIC STUDIES:  Oxygen Saturation is 97% on RA, normal by my interpretation.    COORDINATION OF CARE:  12:53 PM Discussed treatment plan with pt at bedside and pt agreed to plan.  Initial Impression / Assessment and Plan / ED Course  I have reviewed the triage vital signs and the nursing notes.  Pertinent labs & imaging results that were available during my care of the patient were reviewed by me and considered in my medical decision making (see chart for details).  Clinical Course    60 year old female here with left leg pain after a fall 3 days ago. Pain mostly localized to the left knee and hip. No bony deformity, leg shortening, or neurologic deficit noted on exam.  X-rays with arthritis of the medial compartment of the knee, no acute fracture or dislocation. Hip films are normal. Patient was treated here with improvement of symptoms. She has been ambulatory in the emergency department with steady gait without assistance. Will treat symptomatically. I have recommended that she follow-up with her primary care doctor.  Discussed plan with patient and family, they acknowledged  understanding and agreed with plan of care.  Return precautions given for new or worsening symptoms.  I personally performed the services described in this documentation, which was scribed in my presence. The recorded information has been reviewed and is accurate.  Final Clinical Impressions(s) / ED Diagnoses   Final diagnoses:  Fall  Acute pain of left knee  Left hip pain    New Prescriptions Discharge Medication List as of 10/08/2016  2:34 PM    START taking these medications   Details  HYDROcodone-acetaminophen (NORCO/VICODIN) 5-325 MG tablet Take 1 tablet by mouth every 4 (four) hours as needed., Starting Sat 10/08/2016, Print    lidocaine (LIDODERM) 5 % Place 1 patch onto the skin daily. Remove & Discard patch within 12 hours or as directed by MD, Starting Sat 10/08/2016, Print    meloxicam (MOBIC) 7.5 MG tablet Take 1 tablet (7.5 mg total) by mouth daily., Starting Sat 10/08/2016, Print         Larene Pickett, PA-C 10/08/16 1451    Quintella Reichert, MD 10/11/16 [phone]

## 2016-10-09 ENCOUNTER — Encounter (HOSPITAL_COMMUNITY): Payer: Self-pay | Admitting: Emergency Medicine

## 2016-10-09 ENCOUNTER — Emergency Department (HOSPITAL_COMMUNITY)
Admission: EM | Admit: 2016-10-09 | Discharge: 2016-10-10 | Disposition: A | Discharge status: Home / Self Care | Payer: Medicaid Other | Attending: Emergency Medicine | Admitting: Emergency Medicine

## 2016-10-09 DIAGNOSIS — L0231 Cutaneous abscess of buttock: Secondary | ICD-10-CM | POA: Diagnosis present

## 2016-10-09 DIAGNOSIS — Z794 Long term (current) use of insulin: Secondary | ICD-10-CM

## 2016-10-09 DIAGNOSIS — Z79899 Other long term (current) drug therapy: Secondary | ICD-10-CM | POA: Diagnosis not present

## 2016-10-09 DIAGNOSIS — IMO0001 Reserved for inherently not codable concepts without codable children: Secondary | ICD-10-CM

## 2016-10-09 DIAGNOSIS — J45909 Unspecified asthma, uncomplicated: Secondary | ICD-10-CM | POA: Insufficient documentation

## 2016-10-09 DIAGNOSIS — L0291 Cutaneous abscess, unspecified: Secondary | ICD-10-CM

## 2016-10-09 DIAGNOSIS — E119 Type 2 diabetes mellitus without complications: Secondary | ICD-10-CM

## 2016-10-09 DIAGNOSIS — E109 Type 1 diabetes mellitus without complications: Secondary | ICD-10-CM | POA: Diagnosis not present

## 2016-10-09 DIAGNOSIS — I10 Essential (primary) hypertension: Secondary | ICD-10-CM | POA: Insufficient documentation

## 2016-10-09 NOTE — ED Triage Notes (Signed)
Pt. reports " Boil" skin abscess at buttocks with swelling onset last night .

## 2016-10-10 HISTORY — PX: BACK SURGERY: SHX140

## 2016-10-10 HISTORY — PX: LUMBAR FUSION: SHX111

## 2016-10-10 LAB — CBG MONITORING, ED: GLUCOSE-CAPILLARY: 99 mg/dL (ref 65–99)

## 2016-10-10 MED ORDER — CEPHALEXIN 500 MG PO CAPS: 500.0000 mg | ORAL_CAPSULE | Freq: Four times a day (QID) | ORAL | 0 refills | Status: DC

## 2016-10-10 MED ORDER — LIDOCAINE-EPINEPHRINE (PF) 2 %-1:200000 IJ SOLN
20.0000 mL | Freq: Once | INTRAMUSCULAR | Status: AC
  Administered 2016-10-10: 20 mL
  Filled 2016-10-10: qty 20

## 2016-10-10 NOTE — ED Provider Notes (Signed)
[facility] DEPT Provider Note   CSN: [id number] Arrival date & time: 10/09/16  2305   By signing my name below, I, Neta Mends, attest that this documentation has been prepared under the direction and in the presence of [facility], PA-C. Electronically Signed: Neta Mends, ED Scribe. 10/10/2016. 12:25 AM.   History   Chief Complaint Chief Complaint  Patient presents with  . Abscess    The history is provided by the patient. No language interpreter was used.    HPI Comments: Kara Kelley is a 61 y.o. female with PMHx of DM and HTN who presents to the Emergency Department complaining of a moderate, gradually worsening area of pain and swelling to the intergluteal cleft x 3 days. Pt states pain is exacerbated with palpation and direct pressure, and notes that the swelling worsened yesterday. Pt reports previous similar abscesses in other locations. Pt takes medication for her DM and HTN regularly, and notes no known allergies to medication. No alleviating factors noted. Pt denies fever, chills, nausea, vomiting.    Past Medical History:  Diagnosis Date  . Anemia   . Arthritis   . Asthma   . Bunion   . Callus   . Chronic pain   . Cocaine abuse   . Corns and callosities   . Degenerative joint disease   . Depression   . Diabetes mellitus   . ETOH abuse   . Gall stones   . GERD (gastroesophageal reflux disease)   . Hepatitis C   . Hyperlipidemia   . Hypertension   . Substance abuse    alcoholism    Patient Active Problem List   Diagnosis Date Noted  . Chronic hepatitis C without hepatic coma ([place]) 12/09/2015  . Inclusion cyst 09/16/2014  . Onychomycosis 08/15/2014  . Type I (juvenile type) diabetes mellitus with renal manifestations, not stated as uncontrolled(250.41) 06/10/2014  . Unspecified constipation 05/22/2014  . Esophageal reflux 05/22/2014  . Pain in lower limb 01/29/2014  . Eschar of multiple sites 07/31/2013  . Porokeratosis  07/02/2013  . Cyst of joint of ankle or foot 06/04/2013  . Status post foot surgery 02/06/2013  . Pain in joint, ankle and foot 01/23/2013  . Callus of foot 01/23/2013  . Ingrown nail 01/23/2013  . Deformity of metatarsal 01/23/2013    Past Surgical History:  Procedure Laterality Date  . CESAREAN SECTION     x3  . CHOLECYSTECTOMY N/A 08/11/2016   Procedure: LAPAROSCOPIC CHOLECYSTECTOMY WITH   INTRAOPERATIVE CHOLANGIOGRAM;  Surgeon: Stark Klein, MD;  Location: [facility] ORS;  Service: General;  Laterality: N/A;  . COLONOSCOPY    . Cotton Osteotomy w/ Graft Left 06/18/2009  . Excision of Benign Lesion Right 01/30/2013   Rt Plantar  . FOOT SURGERY    . HAMMER TOE REPAIR Right 02/12/2016   RIGHT #5  . Hammertoe Repair Left 06/18/2009   Lt #5  . METATARSAL OSTEOTOMY Left 06/18/2009   #5  . Nail Matrixectomy Left 06/18/2009   LT #1  . OSTEOTOMY Right 01/30/2013   Rt #5  . Phalangectomy Left 06/18/2009   LT #1  . Phalangectomy Right 01/30/2013   Rt #1    OB History    Gravida Para Term Preterm AB Living   _0 SAB TAB Ectopic Multiple Live Births     1             Home Medications    Prior to Admission  medications   Medication Sig Start Date End Date Taking? Authorizing Provider  albuterol (PROVENTIL HFA;VENTOLIN HFA) 108 (90 BASE) MCG/ACT inhaler Inhale 2 puffs into the lungs every 6 (six) hours as needed. For shortness of breath.    Historical Provider, MD  ARIPiprazole (ABILIFY) 5 MG tablet Take 5 mg by mouth daily. 07/21/16   Historical Provider, MD  Blood Glucose Monitoring Suppl (ACCU-CHEK AVIVA PLUS) W/DEVICE KIT 1 Device by Does not apply route once. 06/10/14   Renato Shin, MD  buPROPion (WELLBUTRIN XL) 300 MG 24 hr tablet Take 300 mg by mouth every morning. 05/14/15   Historical Provider, MD  cephALEXin (KEFLEX) 500 MG capsule Take 1 capsule (500 mg total) by mouth 4 (four) times daily. 10/10/16   Lizette Pazos, PA-C  glucose blood (ACCU-CHEK AVIVA) test strip 1 each  by Other route 2 (two) times daily. And lancets 2/day 250.43 06/10/14   Renato Shin, MD  HYDROcodone-acetaminophen (NORCO/VICODIN) 5-325 MG tablet Take 1 tablet by mouth every 4 (four) hours as needed. 10/08/16   Larene Pickett, PA-C  LEVEMIR FLEXTOUCH 100 UNIT/ML Pen Inject 55 Units into the skin at bedtime. 07/20/16   Historical Provider, MD  lidocaine (LIDODERM) 5 % Place 1 patch onto the skin daily. Remove & Discard patch within 12 hours or as directed by MD 10/08/16   Larene Pickett, PA-C  meloxicam (MOBIC) 7.5 MG tablet Take 1 tablet (7.5 mg total) by mouth daily. 10/08/16   Larene Pickett, PA-C  NOVOLOG FLEXPEN 100 UNIT/ML FlexPen Inject 18 Units into the skin 3 (three) times daily with meals. 07/20/16   Historical Provider, MD  omeprazole (PRILOSEC) 40 MG capsule Take 40 mg by mouth daily. 07/19/16   Historical Provider, MD  oxyCODONE (OXY IR/ROXICODONE) 5 MG immediate release tablet Take 1-2 tablets (5-10 mg total) by mouth every 4 (four) hours as needed for moderate pain, severe pain or breakthrough pain. 08/11/16   Stark Klein, MD  oxyCODONE-acetaminophen (PERCOCET) 10-325 MG tablet Take 1 tablet by mouth 4 (four) times daily as needed. For pain. 07/19/16   Historical Provider, MD  pregabalin (LYRICA) 75 MG capsule Take 75 mg by mouth 2 (two) times daily.    Historical Provider, MD  spironolactone (ALDACTONE) 50 MG tablet Take 50 mg by mouth daily.    Historical Provider, MD  traZODone (DESYREL) 100 MG tablet Take 100 mg by mouth at bedtime. 05/18/15   Historical Provider, MD  zolpidem (AMBIEN) 10 MG tablet Take 10 mg by mouth at bedtime as needed. 07/21/16   Historical Provider, MD    Family History Family History  Problem Relation Age of Onset  . Heart disease Mother   . Diabetes      mat great aunt  . Cirrhosis      mat great aunt  . Cirrhosis      mat great uncles x 2  . Colon cancer Neg Hx     Social History Social History  Substance Use Topics  . Smoking status: Former  Smoker    Packs/day: 0.50    Types: Cigarettes    Quit date: 12/10/2015  . Smokeless tobacco: Never Used  . Alcohol use No     Comment: in rehab     Allergies   Patient has no known allergies.   Review of Systems Review of Systems  Constitutional: Negative for chills and fever.  Skin:       Positive for abscess.  All other systems reviewed and are negative.  Physical Exam Updated Vital Signs BP (!) 166/102   Pulse 89   Temp 98.6 F (37 C) (Oral)   SpO2 100%   Physical Exam  Constitutional: She appears well-developed and well-nourished. No distress.  Awake, alert, nontoxic appearance  HENT:  Head: Normocephalic and atraumatic.  Mouth/Throat: Oropharynx is clear and moist. No oropharyngeal exudate.  Eyes: Conjunctivae are normal. No scleral icterus.  Neck: Normal range of motion. Neck supple.  Cardiovascular: Normal rate, regular rhythm and intact distal pulses.   Pulmonary/Chest: Effort normal and breath sounds normal. No respiratory distress. She has no wheezes.  Equal chest expansion  Abdominal: Soft. Bowel sounds are normal. She exhibits no mass. There is no tenderness. There is no rebound and no guarding.  Genitourinary:  Genitourinary Comments: Area of erythema and induration to the gluteal cleft with palpable area of fluctuance located along the left buttock No tenderness around the anal sphincter  Musculoskeletal: Normal range of motion. She exhibits no edema.  Neurological: She is alert.  Speech is clear and goal oriented Moves extremities without ataxia  Skin: Skin is warm and dry. She is not diaphoretic.  Psychiatric: She has a normal mood and affect.  Nursing note and vitals reviewed.    ED Treatments / Results  DIAGNOSTIC STUDIES:  Oxygen Saturation is 100% on RA, normal by my interpretation.    COORDINATION OF CARE:  12:25 AM Discussed treatment plan with pt at bedside and pt agreed to plan.   Labs (all labs ordered are listed, but only  abnormal results are displayed) Labs Reviewed  CBG MONITORING, ED     Procedures Procedures (including critical care time)   .Marland KitchenIncision and Drainage Date/Time: 10/10/2016 1:52 AM Performed by: Abigail Butts Authorized by: Abigail Butts   Consent:    Consent obtained:  Verbal   Consent given by:  Patient   Risks discussed:  Bleeding, incomplete drainage, pain and infection   Alternatives discussed:  No treatment Location:    Type:  Abscess   Location:  Anogenital   Anogenital location:  Gluteal cleft Pre-procedure details:    Skin preparation:  Betadine Anesthesia (see MAR for exact dosages):    Anesthesia method:  Local infiltration   Local anesthetic:  Lidocaine 2% WITH epi (63m) Procedure type:    Complexity:  Complex Procedure details:    Needle aspiration: no     Incision types:  Single straight   Scalpel blade:  11   Wound management:  Probed and deloculated, irrigated with saline and extensive cleaning   Drainage:  Bloody and purulent   Drainage amount:  Moderate   Wound treatment:  Wound left open   Packing materials:  1/4 in gauze Post-procedure details:    Patient tolerance of procedure:  Tolerated well, no immediate complications    EMERGENCY DEPARTMENT [place]FT TISSUE INTERPRETATION "Study: Limited Ultrasound of the noted body part in comments below"  INDICATIONS: Pain and Soft tissue infection Multiple views of the body part are obtained with a multi-frequency linear probe  PERFORMED BY:  Myself  IMAGES ARCHIVED?: Yes  SIDE:Midline  BODY PART: gluteal cleft  FINDINGS: Abcess present  LIMITATIONS:  Body Habitus  INTERPRETATION:  Abcess present  COMMENT:  Large abscess noted to the gluteal cleft, minimal cellulitis surrounding   Medications Ordered in ED Medications  lidocaine-EPINEPHrine (XYLOCAINE W/EPI) 2 %-1:200000 (PF) injection 20 mL (20 mLs Infiltration Given 10/10/16 0045)     Initial Impression / Assessment and  Plan / ED Course  I have reviewed the  triage vital signs and the nursing notes.  Pertinent labs & imaging results that were available during my care of the patient were reviewed by me and considered in my medical decision making (see chart for details).  Clinical Course     Patient with skin abscess amenable to incision and drainage.  Abscess was deep enough to warrant packing,  wound recheck in 2 days. Encouraged home warm soaks and flushing.  Mild signs of cellulitis is surrounding skin.  Will d/c to home.  Keflex given as patient is an insulin-dependent diabetic. Blood sugar 99 today. No fevers or chills.  Patient noted to be hypertensive in the emergency department.  No signs of hypertensive urgency.  Discussed with patient the need for close follow-up and management by their primary care physician.   Final Clinical Impressions(s) / ED Diagnoses   Final diagnoses:  Abscess  Insulin dependent diabetes mellitus (HCC)  Essential hypertension    New Prescriptions New Prescriptions   CEPHALEXIN (KEFLEX) 500 MG CAPSULE    Take 1 capsule (500 mg total) by mouth 4 (four) times daily.   I personally performed the services described in this documentation, which was scribed in my presence. The recorded information has been reviewed and is accurate.     Jarrett Soho Kolbie Lepkowski, PA-C 10/10/16 [place], DO 10/10/16 [id number]

## 2016-10-10 NOTE — ED Notes (Signed)
ED Provider at bedside. 

## 2016-10-10 NOTE — Discharge Instructions (Signed)
1. Medications: Ibuprofen or Tylenol for pain, Bactrim is her antibiotic, usual home medications 2. Treatment: rest, drink plenty of fluids, use warm compresses, flush abscess with warm water several times per day, complete antibiotic course 3. Follow Up: Please followup with your primary doctor in 2 days for wound check and discussion of your diagnoses and further evaluation after today's visit; if you do not have a primary care doctor use the resource guide provided to find one; Please return to the ER for fevers, chills, nausea, vomiting or other signs of infection

## 2016-10-13 ENCOUNTER — Emergency Department (HOSPITAL_COMMUNITY)
Admission: EM | Admit: 2016-10-13 | Discharge: 2016-10-14 | Disposition: A | Discharge status: Home / Self Care | Payer: Medicaid Other | Attending: Emergency Medicine | Admitting: Emergency Medicine

## 2016-10-13 ENCOUNTER — Encounter (HOSPITAL_COMMUNITY): Payer: Self-pay | Admitting: Emergency Medicine

## 2016-10-13 DIAGNOSIS — J45909 Unspecified asthma, uncomplicated: Secondary | ICD-10-CM | POA: Diagnosis not present

## 2016-10-13 DIAGNOSIS — Z4801 Encounter for change or removal of surgical wound dressing: Secondary | ICD-10-CM | POA: Diagnosis present

## 2016-10-13 DIAGNOSIS — Z79899 Other long term (current) drug therapy: Secondary | ICD-10-CM | POA: Insufficient documentation

## 2016-10-13 DIAGNOSIS — I10 Essential (primary) hypertension: Secondary | ICD-10-CM | POA: Diagnosis not present

## 2016-10-13 DIAGNOSIS — E109 Type 1 diabetes mellitus without complications: Secondary | ICD-10-CM | POA: Diagnosis not present

## 2016-10-13 DIAGNOSIS — Z09 Encounter for follow-up examination after completed treatment for conditions other than malignant neoplasm: Secondary | ICD-10-CM

## 2016-10-13 DIAGNOSIS — Z87891 Personal history of nicotine dependence: Secondary | ICD-10-CM | POA: Insufficient documentation

## 2016-10-13 MED ORDER — LIDOCAINE HCL (PF) 1 % IJ SOLN
2.0000 mL | Freq: Once | INTRAMUSCULAR | Status: DC
  Filled 2016-10-13: qty 30

## 2016-10-13 MED ORDER — OXYCODONE-ACETAMINOPHEN 5-325 MG PO TABS
1.0000 | ORAL_TABLET | ORAL | Status: AC | PRN
  Administered 2016-10-13 (×2): 1 via ORAL
  Filled 2016-10-13 (×2): qty 1

## 2016-10-13 NOTE — ED Provider Notes (Signed)
[facility] DEPT Provider Note   CSN: [id number] Arrival date & time: 10/13/16  2102  By signing my name below, I, Kara Kelley, attest that this documentation has been prepared under the direction and in the presence of Junius Creamer, NP-C. Electronically Signed: Norris Kelley , ED Scribe. 10/14/16. 12:31 AM.    History   Chief Complaint Chief Complaint  Patient presents with  . Wound Check    HPI Comments: Kara Kelley is a 61 y.o. female who presents to the Emergency Department complaining of abscess with onset x4 hours ago. Pt states that she was seen in December for an abscess to the gluteal cleft on the R side. The abscess was reportedly lanced and packed. Pt was seen today for a wound check and afterwards the area was packed. Pt has an abscess to the R sided gluteal cleft, incision is 1.5 inches wide with purulent drainage. Pt reportedly pulled the packing out when she went to use the restroom x4 hours ago and presents to have it repacked.   The history is provided by the patient. No language interpreter was used.    Past Medical History:  Diagnosis Date  . Anemia   . Arthritis   . Asthma   . Bunion   . Callus   . Chronic pain   . Cocaine abuse   . Corns and callosities   . Degenerative joint disease   . Depression   . Diabetes mellitus   . ETOH abuse   . Gall stones   . GERD (gastroesophageal reflux disease)   . Hepatitis C   . Hyperlipidemia   . Hypertension   . Substance abuse    alcoholism    Patient Active Problem List   Diagnosis Date Noted  . Chronic hepatitis C without hepatic coma ([place]) 12/09/2015  . Inclusion cyst 09/16/2014  . Onychomycosis 08/15/2014  . Type I (juvenile type) diabetes mellitus with renal manifestations, not stated as uncontrolled(250.41) 06/10/2014  . Unspecified constipation 05/22/2014  . Esophageal reflux 05/22/2014  . Pain in lower limb 01/29/2014  . Eschar of multiple sites 07/31/2013  . Porokeratosis 07/02/2013   . Cyst of joint of ankle or foot 06/04/2013  . Status post foot surgery 02/06/2013  . Pain in joint, ankle and foot 01/23/2013  . Callus of foot 01/23/2013  . Ingrown nail 01/23/2013  . Deformity of metatarsal 01/23/2013    Past Surgical History:  Procedure Laterality Date  . CESAREAN SECTION     x3  . CHOLECYSTECTOMY N/A 08/11/2016   Procedure: LAPAROSCOPIC CHOLECYSTECTOMY WITH   INTRAOPERATIVE CHOLANGIOGRAM;  Surgeon: Stark Klein, MD;  Location: [facility] ORS;  Service: General;  Laterality: N/A;  . COLONOSCOPY    . Cotton Osteotomy w/ Graft Left 06/18/2009  . Excision of Benign Lesion Right 01/30/2013   Rt Plantar  . FOOT SURGERY    . HAMMER TOE REPAIR Right 02/12/2016   RIGHT #5  . Hammertoe Repair Left 06/18/2009   Lt #5  . METATARSAL OSTEOTOMY Left 06/18/2009   #5  . Nail Matrixectomy Left 06/18/2009   LT #1  . OSTEOTOMY Right 01/30/2013   Rt #5  . Phalangectomy Left 06/18/2009   LT #1  . Phalangectomy Right 01/30/2013   Rt #1    OB History    Gravida Para Term Preterm AB Living   '4 3     1 3   ' SAB TAB Ectopic Multiple Live Births     1  Home Medications    Prior to Admission medications   Medication Sig Start Date End Date Taking? Authorizing Provider  albuterol (PROVENTIL HFA;VENTOLIN HFA) 108 (90 BASE) MCG/ACT inhaler Inhale 2 puffs into the lungs every 6 (six) hours as needed. For shortness of breath.   Yes Historical Provider, MD  ARIPiprazole (ABILIFY) 5 MG tablet Take 5 mg by mouth daily. 07/21/16  Yes Historical Provider, MD  buPROPion (WELLBUTRIN XL) 300 MG 24 hr tablet Take 300 mg by mouth every morning. 05/14/15  Yes Historical Provider, MD  cephALEXin (KEFLEX) 500 MG capsule Take 1 capsule (500 mg total) by mouth 4 (four) times daily. 10/10/16  Yes Hannah Muthersbaugh, PA-C  LEVEMIR FLEXTOUCH 100 UNIT/ML Pen Inject 55 Units into the skin at bedtime. 07/20/16  Yes Historical Provider, MD  meloxicam (MOBIC) 7.5 MG tablet Take 1 tablet (7.5 mg total) by  mouth daily. 10/08/16  Yes Larene Pickett, PA-C  NOVOLOG FLEXPEN 100 UNIT/ML FlexPen Inject 18 Units into the skin 3 (three) times daily with meals. 07/20/16  Yes Historical Provider, MD  omeprazole (PRILOSEC) 40 MG capsule Take 40 mg by mouth daily. 07/19/16  Yes Historical Provider, MD  oxyCODONE (OXY IR/ROXICODONE) 5 MG immediate release tablet Take 1-2 tablets (5-10 mg total) by mouth every 4 (four) hours as needed for moderate pain, severe pain or breakthrough pain. 08/11/16  Yes Stark Klein, MD  oxyCODONE-acetaminophen (PERCOCET) 10-325 MG tablet Take 1 tablet by mouth 4 (four) times daily as needed for pain.  09/14/16  Yes Historical Provider, MD  pregabalin (LYRICA) 75 MG capsule Take 75 mg by mouth 2 (two) times daily.   Yes Historical Provider, MD  spironolactone (ALDACTONE) 50 MG tablet Take 50 mg by mouth daily.   Yes Historical Provider, MD  traZODone (DESYREL) 100 MG tablet Take 100 mg by mouth at bedtime. 05/18/15  Yes Historical Provider, MD  zolpidem (AMBIEN) 10 MG tablet Take 10 mg by mouth at bedtime as needed for sleep.  07/21/16  Yes Historical Provider, MD  Blood Glucose Monitoring Suppl (ACCU-CHEK AVIVA PLUS) W/DEVICE KIT 1 Device by Does not apply route once. 06/10/14   Renato Shin, MD  glucose blood (ACCU-CHEK AVIVA) test strip 1 each by Other route 2 (two) times daily. And lancets 2/day 250.43 06/10/14   Renato Shin, MD  HYDROcodone-acetaminophen (NORCO/VICODIN) 5-325 MG tablet Take 1 tablet by mouth every 4 (four) hours as needed. Patient not taking: Reported on 10/13/2016 10/08/16   Larene Pickett, PA-C  lidocaine (LIDODERM) 5 % Place 1 patch onto the skin daily. Remove & Discard patch within 12 hours or as directed by MD Patient not taking: Reported on 10/13/2016 10/08/16   Larene Pickett, PA-C    Family History Family History  Problem Relation Age of Onset  . Heart disease Mother   . Diabetes      mat great aunt  . Cirrhosis      mat great aunt  . Cirrhosis      mat  great uncles x 2  . Colon cancer Neg Hx     Social History Social History  Substance Use Topics  . Smoking status: Former Smoker    Packs/day: 0.50    Types: Cigarettes    Quit date: 12/10/2015  . Smokeless tobacco: Never Used  . Alcohol use No     Comment: in rehab     Allergies   Patient has no known allergies.   Review of Systems Review of Systems  Constitutional: Negative for  chills and fever.  HENT: Negative for ear pain and sore throat.   Eyes: Negative for pain and visual disturbance.  Respiratory: Negative for cough and shortness of breath.   Cardiovascular: Negative for chest pain and palpitations.  Gastrointestinal: Negative for abdominal pain and vomiting.  Genitourinary: Negative for dysuria and hematuria.  Musculoskeletal: Negative for arthralgias and back pain.  Skin: Positive for wound (abscess to R gluteal cleft). Negative for color change and rash.  Neurological: Negative for seizures and syncope.  All other systems reviewed and are negative.    Physical Exam Updated Vital Signs BP (!) 149/101 (BP Location: Left Arm)   Pulse 91   Temp 98.1 F (36.7 C) (Oral)   Resp 20   Ht 5' 5.5" (1.664 m)   Wt 90.5 kg   SpO2 97%   BMI 32.68 kg/m   Physical Exam  Constitutional: She appears well-developed and well-nourished. No distress.  HENT:  Head: Normocephalic and atraumatic.  Eyes: Conjunctivae are normal.  Neck: Neck supple.  Cardiovascular: Normal rate and regular rhythm.   No murmur heard. Pulmonary/Chest: Effort normal and breath sounds normal. No respiratory distress.  Abdominal: Soft. There is no tenderness.  Musculoskeletal: She exhibits no edema.  Neurological: She is alert.  Skin: Skin is warm and dry.  Abscess in the gluteal cleft has been open and draining was repacked with a primary care physician today.  Packing was inadvertently pulled out.  There is a 1-1/2 inch linear opening with a small amount of purulent drainage, minimal  surrounding erythema  Psychiatric: She has a normal mood and affect.  Nursing note and vitals reviewed.    ED Treatments / Results   DIAGNOSTIC STUDIES: Oxygen Saturation is 97% on RA, adequate by my interpretation.   COORDINATION OF CARE: 12:31 AM-Discussed next steps with pt. Pt verbalized understanding and is agreeable with the plan.    Labs (all labs ordered are listed, but only abnormal results are displayed) Labs Reviewed - No data to display  EKG  EKG Interpretation None       Radiology No results found.  Procedures Procedures (including critical care time)  Medications Ordered in ED Medications  lidocaine (PF) (XYLOCAINE) 1 % injection 2 mL (not administered)  oxyCODONE-acetaminophen (PERCOCET/ROXICET) 5-325 MG per tablet 1 tablet (1 tablet Oral Given 10/13/16 2303)     Initial Impression / Assessment and Plan / ED Course  I have reviewed the triage vital signs and the nursing notes.  Pertinent labs & imaging results that were available during my care of the patient were reviewed by me and considered in my medical decision making (see chart for details).  Clinical Course    Existing IND abscess repacked with 6 images of 1/2 inch iodoform gauze    Final Clinical Impressions(s) / ED Diagnoses   Final diagnoses:  Encounter for recheck of abscess following incision and drainage    New Prescriptions New Prescriptions   No medications on file   I personally performed the services described in this documentation, which was scribed in my presence. The recorded information has been reviewed and is accurate.    Junius Creamer, NP 10/14/16 0028    Junius Creamer, NP 10/14/16 [id number]    Sherwood Gambler, MD 10/14/16 1725

## 2016-10-13 NOTE — ED Triage Notes (Signed)
Pt had abscess lanced this past Sunday; wound was repacked by PCP this afternoon; pt's daughter states " the doctor didn't pack it right" and that the packing came out; thick red/yellow drainage present

## 2016-10-14 NOTE — Discharge Instructions (Signed)
You wound was repacked.  Please be very careful not to pull this out again.  Keep your appointment is scheduled with your primary care physician.  He may shower or apply a warm compress to the area, but please do not sit in a tub or sitz bath

## 2016-10-21 ENCOUNTER — Encounter: Payer: Medicaid Other | Attending: Surgery | Admitting: Surgery

## 2016-10-21 DIAGNOSIS — L0231 Cutaneous abscess of buttock: Secondary | ICD-10-CM | POA: Diagnosis not present

## 2016-10-21 DIAGNOSIS — B1920 Unspecified viral hepatitis C without hepatic coma: Secondary | ICD-10-CM | POA: Insufficient documentation

## 2016-10-21 DIAGNOSIS — F17218 Nicotine dependence, cigarettes, with other nicotine-induced disorders: Secondary | ICD-10-CM | POA: Diagnosis not present

## 2016-10-21 DIAGNOSIS — E11622 Type 2 diabetes mellitus with other skin ulcer: Secondary | ICD-10-CM | POA: Insufficient documentation

## 2016-10-21 DIAGNOSIS — I10 Essential (primary) hypertension: Secondary | ICD-10-CM | POA: Insufficient documentation

## 2016-10-21 DIAGNOSIS — S31829A Unspecified open wound of left buttock, initial encounter: Secondary | ICD-10-CM | POA: Insufficient documentation

## 2016-10-21 DIAGNOSIS — X58XXXA Exposure to other specified factors, initial encounter: Secondary | ICD-10-CM | POA: Diagnosis not present

## 2016-10-21 DIAGNOSIS — Z794 Long term (current) use of insulin: Secondary | ICD-10-CM | POA: Diagnosis not present

## 2016-10-21 DIAGNOSIS — Z79899 Other long term (current) drug therapy: Secondary | ICD-10-CM | POA: Insufficient documentation

## 2016-10-21 NOTE — Progress Notes (Addendum)
last hemoglobin A1c in October 2017 was 9.9 has been fairly noncompliant with her visits to the PCP. she had an incision and drainage done in the ER and her PCP has asked her to follow-up with as. After review I have recommended: 1. The wound with half inch iodoform gauze and this is to be done daily. 2. Complete her course of antibiotics as given to her by the ER 3. good control of her diabetes mellitus and I have  asked her to work with her PCP to get this well- controlled 4. Adequate protein, vitamin C, vitamin A and zinc 5. I have spent 3 minutes discussing with her the need to completely give up smoking and I discussed the risks to wound healing in particular her questions have been answered and she will be compliant with her dressing changes Electronic Signature(s) Signed: 10/21/2016 9:04:09 AM By: Evlyn KannerBritto, Yulissa Needham MD, FACS Entered By: Evlyn KannerBritto, Erik Nessel on 10/21/2016 09:04:09 Kara Kelley, Kara V. ([id number]) -------------------------------------------------------------------------------- ROS/PFSH Details Patient Name: Kara Kelley, Kara V. Date of Service: 10/21/2016 8:00 AM Medical Record Number: [id number] Patient Account Number: [id number] Date of Birth/Sex: 1956-02-11 (60 y.o. Female) Treating RN: Curtis Sitesorthy, Joanna Primary Care Physician: Dorothyann PengSANDERS, ROBYN Other Clinician: Referring Physician: Arnette FeltsMOORE, JANECE Treating Physician/Extender: Rudene ReBritto, Lateisha Thurlow Weeks in Treatment: 0 Information Obtained From Patient Wound History Do you currently have one or more open woundso Yes How many open wounds do you currently haveo 1 Approximately how long have you had your woundso Dec 31 How have you been treating your wound(s) until nowo packing Has your wound(s) ever healed and then re-openedo No Have you had any lab work done in the past montho Yes Who ordered the lab work [facility] Have you tested positive for an antibiotic resistant organism (MRSA, VRE)o No Have you tested positive for osteomyelitis (bone infection)o No Have you had any tests for circulation on your legso No Have you had other problems associated with your woundso Infection, Swelling Constitutional Symptoms (General Health) Complaints and Symptoms: No Complaints or Symptoms Eyes Complaints and Symptoms: No Complaints or Symptoms Ear/Nose/Mouth/Throat Complaints and Symptoms: No Complaints or Symptoms Hematologic/Lymphatic Complaints and  Symptoms: No Complaints or Symptoms Respiratory Complaints and Symptoms: No Complaints or Symptoms Cardiovascular Kara Kelley, Kara V. ([id number]) Complaints and Symptoms: No Complaints or Symptoms Medical History: Positive for: Hypertension Gastrointestinal Complaints and Symptoms: No Complaints or Symptoms Medical History: Positive for: Hepatitis C Endocrine Complaints and Symptoms: No Complaints or Symptoms Medical History: Positive for: Type II Diabetes Treated with: Insulin, Oral agents Genitourinary Complaints and Symptoms: No Complaints or Symptoms Immunological Complaints and Symptoms: No Complaints or Symptoms Integumentary (Skin) Complaints and Symptoms: No Complaints or Symptoms Musculoskeletal Complaints and Symptoms: No Complaints or Symptoms Neurologic Complaints and Symptoms: No Complaints or Symptoms Oncologic Complaints and Symptoms: No Complaints or Symptoms Kara Kelley, Kara V. ([id number]) Medical History: Negative for: Received Chemotherapy; Received Radiation Psychiatric Complaints and Symptoms: No Complaints or Symptoms Immunizations Pneumococcal Vaccine: Received Pneumococcal Vaccination: No Immunization Notes: up to date Family and Social History Current every day smoker; Marital Status - Single; Alcohol Use: Never; Drug Use: No History; Caffeine Use: Never; Financial Concerns: No; Food, Clothing or Shelter Needs: No; Support System Lacking: No; Transportation Concerns: No; Advanced Directives: No; Patient does not want information on Advanced Directives Physician Affirmation I have reviewed and agree with the above information. Electronic Signature(s) Signed: 10/21/2016 4:00:20 PM By: Evlyn KannerBritto, Omie Ferger MD, FACS Signed: 10/21/2016 5:10:42 PM By: Curtis Sitesorthy, Joanna Entered By: Evlyn KannerBritto, Kailea Dannemiller on 10/21/2016 08:54:11 Kara Kelley, Kara V. ([id number]) --------------------------------------------------------------------------------  last hemoglobin A1c in October 2017 was 9.9 has been fairly noncompliant with her visits to the PCP. she had an incision and drainage done in the ER and her PCP has asked her to follow-up with as. After review I have recommended: 1. The wound with half inch iodoform gauze and this is to be done daily. 2. Complete her course of antibiotics as given to her by the ER 3. good control of her diabetes mellitus and I have  asked her to work with her PCP to get this well- controlled 4. Adequate protein, vitamin C, vitamin A and zinc 5. I have spent 3 minutes discussing with her the need to completely give up smoking and I discussed the risks to wound healing in particular her questions have been answered and she will be compliant with her dressing changes Electronic Signature(s) Signed: 10/21/2016 9:04:09 AM By: Evlyn KannerBritto, Yulissa Needham MD, FACS Entered By: Evlyn KannerBritto, Erik Nessel on 10/21/2016 09:04:09 Kara Kelley, Kara V. ([id number]) -------------------------------------------------------------------------------- ROS/PFSH Details Patient Name: Kara Kelley, Kara V. Date of Service: 10/21/2016 8:00 AM Medical Record Number: [id number] Patient Account Number: [id number] Date of Birth/Sex: 1956-02-11 (60 y.o. Female) Treating RN: Curtis Sitesorthy, Joanna Primary Care Physician: Dorothyann PengSANDERS, ROBYN Other Clinician: Referring Physician: Arnette FeltsMOORE, JANECE Treating Physician/Extender: Rudene ReBritto, Lateisha Thurlow Weeks in Treatment: 0 Information Obtained From Patient Wound History Do you currently have one or more open woundso Yes How many open wounds do you currently haveo 1 Approximately how long have you had your woundso Dec 31 How have you been treating your wound(s) until nowo packing Has your wound(s) ever healed and then re-openedo No Have you had any lab work done in the past montho Yes Who ordered the lab work [facility] Have you tested positive for an antibiotic resistant organism (MRSA, VRE)o No Have you tested positive for osteomyelitis (bone infection)o No Have you had any tests for circulation on your legso No Have you had other problems associated with your woundso Infection, Swelling Constitutional Symptoms (General Health) Complaints and Symptoms: No Complaints or Symptoms Eyes Complaints and Symptoms: No Complaints or Symptoms Ear/Nose/Mouth/Throat Complaints and Symptoms: No Complaints or Symptoms Hematologic/Lymphatic Complaints and  Symptoms: No Complaints or Symptoms Respiratory Complaints and Symptoms: No Complaints or Symptoms Cardiovascular Kara Kelley, Kara V. ([id number]) Complaints and Symptoms: No Complaints or Symptoms Medical History: Positive for: Hypertension Gastrointestinal Complaints and Symptoms: No Complaints or Symptoms Medical History: Positive for: Hepatitis C Endocrine Complaints and Symptoms: No Complaints or Symptoms Medical History: Positive for: Type II Diabetes Treated with: Insulin, Oral agents Genitourinary Complaints and Symptoms: No Complaints or Symptoms Immunological Complaints and Symptoms: No Complaints or Symptoms Integumentary (Skin) Complaints and Symptoms: No Complaints or Symptoms Musculoskeletal Complaints and Symptoms: No Complaints or Symptoms Neurologic Complaints and Symptoms: No Complaints or Symptoms Oncologic Complaints and Symptoms: No Complaints or Symptoms Kara Kelley, Kara V. ([id number]) Medical History: Negative for: Received Chemotherapy; Received Radiation Psychiatric Complaints and Symptoms: No Complaints or Symptoms Immunizations Pneumococcal Vaccine: Received Pneumococcal Vaccination: No Immunization Notes: up to date Family and Social History Current every day smoker; Marital Status - Single; Alcohol Use: Never; Drug Use: No History; Caffeine Use: Never; Financial Concerns: No; Food, Clothing or Shelter Needs: No; Support System Lacking: No; Transportation Concerns: No; Advanced Directives: No; Patient does not want information on Advanced Directives Physician Affirmation I have reviewed and agree with the above information. Electronic Signature(s) Signed: 10/21/2016 4:00:20 PM By: Evlyn KannerBritto, Omie Ferger MD, FACS Signed: 10/21/2016 5:10:42 PM By: Curtis Sitesorthy, Joanna Entered By: Evlyn KannerBritto, Kailea Dannemiller on 10/21/2016 08:54:11 Kara Kelley, Kara V. ([id number]) --------------------------------------------------------------------------------  fluctuance. No peri-wound warmth or erythema. No masses.[name] Psychiatric Judgement and insight Intact.. No evidence of depression, anxiety, or agitation.. Notes the patient has a abscess cavity to the left of the midline in the gluteal cleft away from the natal cleft. This is not a pilonidal sinus. The tract is fairly deep about 4-1/2 cm and extremely tender to probe. Electronic Signature(s) Signed: 10/21/2016 9:00:44 AM By: Evlyn Kanner MD, FACS Kara Sousa ([id number]) Entered By: Evlyn Kanner on 10/21/2016 09:00:44 Kara Sousa ([id number]) -------------------------------------------------------------------------------- Physician Orders Details Patient Name: Kara Sousa Date of Service: 10/21/2016 8:00 AM Medical Record Number: [id number] Patient Account Number: [id number] Date of Birth/Sex: November 01, 1955 (60 y.o. Female) Treating RN: Curtis Sites Primary Care Physician: Dorothyann Peng Other Clinician: Referring Physician: Arnette Felts Treating Physician/Extender: Rudene Re in Treatment: 0 Verbal / Phone Orders: Yes Clinician: Curtis Sites Read Back and Verified: Yes Diagnosis Coding Wound Cleansing Wound #1 Midline Sacrum o Clean wound with Normal Saline. Primary Wound Dressing Wound #1 Midline Sacrum o Iodoform packing Gauze Secondary Dressing Wound #1 Midline Sacrum o Dry Gauze o Boardered Foam Dressing - or telfa island Dressing Change  Frequency Wound #1 Midline Sacrum o Change dressing every other day. Follow-up Appointments Wound #1 Midline Sacrum o Return Appointment in 1 week. Additional Orders / Instructions Wound #1 Midline Sacrum o Increase protein intake. Notes Please add vitamin C and vitamin A and zinc supplements to your diet Electronic Signature(s) Signed: 10/21/2016 4:00:20 PM By: Evlyn Kanner MD, FACS Signed: 10/21/2016 5:10:42 PM By: Curtis Sites Entered By: Curtis Sites on 10/21/2016 08:53:29 Kara Sousa ([id number]) -------------------------------------------------------------------------------- Problem List Details Patient Name: Kara Sousa Date of Service: 10/21/2016 8:00 AM Medical Record Number: [id number] Patient Account Number: [id number] Date of Birth/Sex: 08-10-1956 (60 y.o. Female) Treating RN: Curtis Sites Primary Care Physician: Dorothyann Peng Other Clinician: Referring Physician: Arnette Felts Treating Physician/Extender: Rudene Re in Treatment: 0 Active Problems ICD-10 Encounter Code Description Active Date Diagnosis E11.622 Type 2 diabetes mellitus with other skin ulcer 10/21/2016 Yes L02.31 Cutaneous abscess of buttock 10/21/2016 Yes S31.829A Unspecified open wound of left buttock, initial encounter 10/21/2016 Yes F17.218 Nicotine dependence, cigarettes, with other nicotine- 10/21/2016 Yes induced disorders Inactive Problems Resolved Problems Electronic Signature(s) Signed: 10/21/2016 8:55:56 AM By: Evlyn Kanner MD, FACS Entered By: Evlyn Kanner on 10/21/2016 08:55:56 Kara Sousa ([id number]) -------------------------------------------------------------------------------- Progress Note Details Patient Name: Kara Sousa Date of Service: 10/21/2016 8:00 AM Medical Record Number: [id number] Patient Account Number: [id number] Date of Birth/Sex: [birth year] (60 y.o. Female) Treating RN: Curtis Sites Primary Care Physician: Dorothyann Peng Other Clinician: Referring Physician: Arnette Felts Treating Physician/Extender: Rudene Re in Treatment: 0 Subjective Chief Complaint Information obtained from Patient Patients presents for treatment of an open diabetic ulcer to the left gluteal region in the midline for about 2 weeks now History of Present Illness (HPI) The following HPI elements were documented for the patient's wound: Location: abscess in the left gluteal area near the midline Quality: Patient reports experiencing a sharp pain to affected area(s). Severity: Patient states wound are getting worse. Duration: Patient has had the wound for < 2 weeks prior to presenting for treatment Timing: Pain in wound is constant (hurts all the time) Context: The wound occurred when the patient at an abscess which was drained in the ER Modifying Factors: Other treatment(s) tried include:as been packing the wound and has been on Keflex Associated Signs and Symptoms: Patient reports having foul odor. 61 year old patient with a known past  last hemoglobin A1c in October 2017 was 9.9 has been fairly noncompliant with her visits to the PCP. she had an incision and drainage done in the ER and her PCP has asked her to follow-up with as. After review I have recommended: 1. The wound with half inch iodoform gauze and this is to be done daily. 2. Complete her course of antibiotics as given to her by the ER 3. good control of her diabetes mellitus and I have  asked her to work with her PCP to get this well- controlled 4. Adequate protein, vitamin C, vitamin A and zinc 5. I have spent 3 minutes discussing with her the need to completely give up smoking and I discussed the risks to wound healing in particular her questions have been answered and she will be compliant with her dressing changes Electronic Signature(s) Signed: 10/21/2016 9:04:09 AM By: Evlyn KannerBritto, Yulissa Needham MD, FACS Entered By: Evlyn KannerBritto, Erik Nessel on 10/21/2016 09:04:09 Kara Kelley, Kara V. ([id number]) -------------------------------------------------------------------------------- ROS/PFSH Details Patient Name: Kara Kelley, Kara V. Date of Service: 10/21/2016 8:00 AM Medical Record Number: [id number] Patient Account Number: [id number] Date of Birth/Sex: 1956-02-11 (60 y.o. Female) Treating RN: Curtis Sitesorthy, Joanna Primary Care Physician: Dorothyann PengSANDERS, ROBYN Other Clinician: Referring Physician: Arnette FeltsMOORE, JANECE Treating Physician/Extender: Rudene ReBritto, Lateisha Thurlow Weeks in Treatment: 0 Information Obtained From Patient Wound History Do you currently have one or more open woundso Yes How many open wounds do you currently haveo 1 Approximately how long have you had your woundso Dec 31 How have you been treating your wound(s) until nowo packing Has your wound(s) ever healed and then re-openedo No Have you had any lab work done in the past montho Yes Who ordered the lab work [facility] Have you tested positive for an antibiotic resistant organism (MRSA, VRE)o No Have you tested positive for osteomyelitis (bone infection)o No Have you had any tests for circulation on your legso No Have you had other problems associated with your woundso Infection, Swelling Constitutional Symptoms (General Health) Complaints and Symptoms: No Complaints or Symptoms Eyes Complaints and Symptoms: No Complaints or Symptoms Ear/Nose/Mouth/Throat Complaints and Symptoms: No Complaints or Symptoms Hematologic/Lymphatic Complaints and  Symptoms: No Complaints or Symptoms Respiratory Complaints and Symptoms: No Complaints or Symptoms Cardiovascular Kara Kelley, Kara V. ([id number]) Complaints and Symptoms: No Complaints or Symptoms Medical History: Positive for: Hypertension Gastrointestinal Complaints and Symptoms: No Complaints or Symptoms Medical History: Positive for: Hepatitis C Endocrine Complaints and Symptoms: No Complaints or Symptoms Medical History: Positive for: Type II Diabetes Treated with: Insulin, Oral agents Genitourinary Complaints and Symptoms: No Complaints or Symptoms Immunological Complaints and Symptoms: No Complaints or Symptoms Integumentary (Skin) Complaints and Symptoms: No Complaints or Symptoms Musculoskeletal Complaints and Symptoms: No Complaints or Symptoms Neurologic Complaints and Symptoms: No Complaints or Symptoms Oncologic Complaints and Symptoms: No Complaints or Symptoms Kara Kelley, Kara V. ([id number]) Medical History: Negative for: Received Chemotherapy; Received Radiation Psychiatric Complaints and Symptoms: No Complaints or Symptoms Immunizations Pneumococcal Vaccine: Received Pneumococcal Vaccination: No Immunization Notes: up to date Family and Social History Current every day smoker; Marital Status - Single; Alcohol Use: Never; Drug Use: No History; Caffeine Use: Never; Financial Concerns: No; Food, Clothing or Shelter Needs: No; Support System Lacking: No; Transportation Concerns: No; Advanced Directives: No; Patient does not want information on Advanced Directives Physician Affirmation I have reviewed and agree with the above information. Electronic Signature(s) Signed: 10/21/2016 4:00:20 PM By: Evlyn KannerBritto, Omie Ferger MD, FACS Signed: 10/21/2016 5:10:42 PM By: Curtis Sitesorthy, Joanna Entered By: Evlyn KannerBritto, Kailea Dannemiller on 10/21/2016 08:54:11 Kara Kelley, Kara V. ([id number]) --------------------------------------------------------------------------------  EUNA, ARMON ([id number]) Visit Report for 10/21/2016 Chief Complaint Document Details Patient Name: AMORITA, VANROSSUM. Date of Service: 10/21/2016 8:00 AM Medical Record Number: [id number] Patient Account Number: [id number] Date of Birth/Sex: 1956-08-19 (60 y.o. Female) Treating RN: Curtis Sites Primary Care Physician: Dorothyann Peng Other Clinician: Referring Physician: Arnette Felts Treating Physician/Extender: Rudene Re in Treatment: 0 Information Obtained from: Patient Chief Complaint Patients presents for treatment of an open diabetic ulcer to the left gluteal region in the midline for about 2 weeks now Electronic Signature(s) Signed: 10/21/2016 8:56:36 AM By: Evlyn Kanner MD, FACS Entered By: Evlyn Kanner on 10/21/2016 08:56:36 Kara Sousa ([id number]) -------------------------------------------------------------------------------- HPI Details Patient Name: Kara Sousa Date of Service: 10/21/2016 8:00 AM Medical Record Number: [id number] Patient Account Number: [id number] Date of Birth/Sex: [birth year] (60 y.o. Female) Treating RN: Curtis Sites Primary Care Physician: Dorothyann Peng Other Clinician: Referring Physician: Arnette Felts Treating Physician/Extender: Rudene Re in Treatment: 0 History of Present Illness Location: abscess in the left gluteal area near the midline Quality: Patient reports experiencing a sharp pain to affected area(s). Severity: Patient states wound are getting worse. Duration: Patient has had the wound for < 2 weeks prior to presenting for treatment Timing: Pain in wound is constant (hurts all the time) Context: The wound occurred when the patient at an abscess which was drained in the ER Modifying Factors: Other treatment(s) tried include:as been packing the wound and has been on Keflex Associated Signs and Symptoms: Patient reports having foul odor. HPI Description: 61 year old patient with a known past  medical history of diabetes mellitus and hypertension was seen in the ER on January 1 with a abscess in her left gluteal cleft area which required an IandD. She was placed on Keflex and asked to follow-up with her PCP Past medical history significant for diabetes mellitus, alcohol abuse, hepatitis C, substance abuse, cocaine abuse and chronic pain.She is that is post cholecystectomy, foot surgery, colonoscopy. She had smoked stop smoking in March 2017, but now continues to smoke about 5 cigarettes a day. As far as substance abuse goes she has been clean for 3 years. Electronic Signature(s) Signed: 10/21/2016 8:59:38 AM By: Evlyn Kanner MD, FACS Previous Signature: 10/21/2016 8:34:22 AM Version By: Evlyn Kanner MD, FACS Previous Signature: 10/21/2016 8:25:31 AM Version By: Evlyn Kanner MD, FACS Entered By: Evlyn Kanner on 10/21/2016 08:59:38 Kara Sousa ([id number]) -------------------------------------------------------------------------------- Physical Exam Details Patient Name: Kara Sousa Date of Service: 10/21/2016 8:00 AM Medical Record Number: [id number] Patient Account Number: [id number] Date of Birth/Sex: Sep 24, 1956 (60 y.o. Female) Treating RN: Curtis Sites Primary Care Physician: Dorothyann Peng Other Clinician: Referring Physician: Arnette Felts Treating Physician/Extender: Rudene Re in Treatment: 0 Constitutional . Pulse regular. Respirations normal and unlabored. Afebrile. . Eyes Nonicteric. Reactive to light. Ears, Nose, Mouth, and Throat Lips, teeth, and gums WNL.[name] Moist mucosa without lesions. Neck supple and nontender. No palpable supraclavicular or cervical adenopathy. Normal sized without goiter. Respiratory WNL. No retractions.. Cardiovascular Pedal Pulses WNL. No clubbing, cyanosis or edema. Gastrointestinal (GI) Abdomen without masses or tenderness.. No liver or spleen enlargement or tenderness.. Genitourinary (GU) No hydrocele,  spermatocele, tenderness of the cord, or testicular mass.[name] Penis without lesions.Renetta Chalk without lesions. No cystocele, or rectocele. Pelvic support intact, no discharge.[name] Urethra without masses, tenderness or scarring.[name] Lymphatic No adneopathy. No adenopathy. No adenopathy. Musculoskeletal Adexa without tenderness or enlargement.. Digits and nails w/o clubbing, cyanosis, infection, petechiae, ischemia, or inflammatory conditions.. Integumentary (Hair, Skin) No suspicious lesions. No crepitus or  last hemoglobin A1c in October 2017 was 9.9 has been fairly noncompliant with her visits to the PCP. she had an incision and drainage done in the ER and her PCP has asked her to follow-up with as. After review I have recommended: 1. The wound with half inch iodoform gauze and this is to be done daily. 2. Complete her course of antibiotics as given to her by the ER 3. good control of her diabetes mellitus and I have  asked her to work with her PCP to get this well- controlled 4. Adequate protein, vitamin C, vitamin A and zinc 5. I have spent 3 minutes discussing with her the need to completely give up smoking and I discussed the risks to wound healing in particular her questions have been answered and she will be compliant with her dressing changes Electronic Signature(s) Signed: 10/21/2016 9:04:09 AM By: Evlyn KannerBritto, Yulissa Needham MD, FACS Entered By: Evlyn KannerBritto, Erik Nessel on 10/21/2016 09:04:09 Kara Kelley, Kara V. ([id number]) -------------------------------------------------------------------------------- ROS/PFSH Details Patient Name: Kara Kelley, Kara V. Date of Service: 10/21/2016 8:00 AM Medical Record Number: [id number] Patient Account Number: [id number] Date of Birth/Sex: 1956-02-11 (60 y.o. Female) Treating RN: Curtis Sitesorthy, Joanna Primary Care Physician: Dorothyann PengSANDERS, ROBYN Other Clinician: Referring Physician: Arnette FeltsMOORE, JANECE Treating Physician/Extender: Rudene ReBritto, Lateisha Thurlow Weeks in Treatment: 0 Information Obtained From Patient Wound History Do you currently have one or more open woundso Yes How many open wounds do you currently haveo 1 Approximately how long have you had your woundso Dec 31 How have you been treating your wound(s) until nowo packing Has your wound(s) ever healed and then re-openedo No Have you had any lab work done in the past montho Yes Who ordered the lab work [facility] Have you tested positive for an antibiotic resistant organism (MRSA, VRE)o No Have you tested positive for osteomyelitis (bone infection)o No Have you had any tests for circulation on your legso No Have you had other problems associated with your woundso Infection, Swelling Constitutional Symptoms (General Health) Complaints and Symptoms: No Complaints or Symptoms Eyes Complaints and Symptoms: No Complaints or Symptoms Ear/Nose/Mouth/Throat Complaints and Symptoms: No Complaints or Symptoms Hematologic/Lymphatic Complaints and  Symptoms: No Complaints or Symptoms Respiratory Complaints and Symptoms: No Complaints or Symptoms Cardiovascular Kara Kelley, Kara V. ([id number]) Complaints and Symptoms: No Complaints or Symptoms Medical History: Positive for: Hypertension Gastrointestinal Complaints and Symptoms: No Complaints or Symptoms Medical History: Positive for: Hepatitis C Endocrine Complaints and Symptoms: No Complaints or Symptoms Medical History: Positive for: Type II Diabetes Treated with: Insulin, Oral agents Genitourinary Complaints and Symptoms: No Complaints or Symptoms Immunological Complaints and Symptoms: No Complaints or Symptoms Integumentary (Skin) Complaints and Symptoms: No Complaints or Symptoms Musculoskeletal Complaints and Symptoms: No Complaints or Symptoms Neurologic Complaints and Symptoms: No Complaints or Symptoms Oncologic Complaints and Symptoms: No Complaints or Symptoms Kara Kelley, Kara V. ([id number]) Medical History: Negative for: Received Chemotherapy; Received Radiation Psychiatric Complaints and Symptoms: No Complaints or Symptoms Immunizations Pneumococcal Vaccine: Received Pneumococcal Vaccination: No Immunization Notes: up to date Family and Social History Current every day smoker; Marital Status - Single; Alcohol Use: Never; Drug Use: No History; Caffeine Use: Never; Financial Concerns: No; Food, Clothing or Shelter Needs: No; Support System Lacking: No; Transportation Concerns: No; Advanced Directives: No; Patient does not want information on Advanced Directives Physician Affirmation I have reviewed and agree with the above information. Electronic Signature(s) Signed: 10/21/2016 4:00:20 PM By: Evlyn KannerBritto, Omie Ferger MD, FACS Signed: 10/21/2016 5:10:42 PM By: Curtis Sitesorthy, Joanna Entered By: Evlyn KannerBritto, Kailea Dannemiller on 10/21/2016 08:54:11 Kara Kelley, Kara V. ([id number]) --------------------------------------------------------------------------------

## 2016-10-22 NOTE — Progress Notes (Addendum)
Foul Odor After Cleansing: No Wound Bed Granulation Amount: Large (67-100%) Exposed Structure Granulation Quality: Red Fascia Exposed: No Necrotic Amount: None Present (0%) Fat Layer Exposed: No Kelley, Kara V. ([id number]) Tendon Exposed: No Muscle Exposed: No Joint Exposed: No Bone Exposed: No Limited to Skin Breakdown Periwound Skin Texture Texture Color No Abnormalities Noted: No No Abnormalities Noted: No Callus:  No Atrophie Blanche: No Crepitus: No Cyanosis: No Excoriation: No Ecchymosis: No Fluctuance: No Erythema: No Friable: No Hemosiderin Staining: No Induration: No Mottled: No Localized Edema: No Pallor: No Rash: No Rubor: No Scarring: No Temperature / Pain Moisture Temperature: No Abnormality No Abnormalities Noted: No Tenderness on Palpation: Yes Dry / Scaly: No Maceration: No Moist: No Wound Preparation Ulcer Cleansing: Rinsed/Irrigated with Saline Topical Anesthetic Applied: Other: lidocaine 4%, Electronic Signature(s) Signed: 10/21/2016 5:10:42 PM By: Curtis Sites Entered By: Curtis Sites on 10/21/2016 08:44:47 Kara Kelley ([id number]) -------------------------------------------------------------------------------- Vitals Details Patient Name: Kara Kelley Date of Service: 10/21/2016 8:00 AM Medical Record Number: [id number] Patient Account Number: [id number] Date of Birth/Sex: [birth year] (61 y.o. Female) Treating RN: Curtis Sites Primary Care Physician: Dorothyann Peng Other Clinician: Referring Physician: Arnette Felts Treating Physician/Extender: Rudene Re in Treatment: 0 Vital Signs Time Taken: 08:21 Temperature (F): 98.0 Height (in): 60 Pulse (bpm): 93 Source: Measured Respiratory Rate (breaths/min): 18 Weight (lbs): 199 Blood Pressure (mmHg): 145/81 Source: Measured Reference Range: 80 - 120 mg / dl Body Mass Index (BMI): 38.9 Electronic Signature(s) Signed: 10/21/2016 5:10:42 PM By: Curtis Sites Entered By: Curtis Sites on 10/21/2016 08:24:58  Foul Odor After Cleansing: No Wound Bed Granulation Amount: Large (67-100%) Exposed Structure Granulation Quality: Red Fascia Exposed: No Necrotic Amount: None Present (0%) Fat Layer Exposed: No Kelley, Kara V. ([id number]) Tendon Exposed: No Muscle Exposed: No Joint Exposed: No Bone Exposed: No Limited to Skin Breakdown Periwound Skin Texture Texture Color No Abnormalities Noted: No No Abnormalities Noted: No Callus:  No Atrophie Blanche: No Crepitus: No Cyanosis: No Excoriation: No Ecchymosis: No Fluctuance: No Erythema: No Friable: No Hemosiderin Staining: No Induration: No Mottled: No Localized Edema: No Pallor: No Rash: No Rubor: No Scarring: No Temperature / Pain Moisture Temperature: No Abnormality No Abnormalities Noted: No Tenderness on Palpation: Yes Dry / Scaly: No Maceration: No Moist: No Wound Preparation Ulcer Cleansing: Rinsed/Irrigated with Saline Topical Anesthetic Applied: Other: lidocaine 4%, Electronic Signature(s) Signed: 10/21/2016 5:10:42 PM By: Curtis Sites Entered By: Curtis Sites on 10/21/2016 08:44:47 Kara Kelley ([id number]) -------------------------------------------------------------------------------- Vitals Details Patient Name: Kara Kelley Date of Service: 10/21/2016 8:00 AM Medical Record Number: [id number] Patient Account Number: [id number] Date of Birth/Sex: [birth year] (61 y.o. Female) Treating RN: Curtis Sites Primary Care Physician: Dorothyann Peng Other Clinician: Referring Physician: Arnette Felts Treating Physician/Extender: Rudene Re in Treatment: 0 Vital Signs Time Taken: 08:21 Temperature (F): 98.0 Height (in): 60 Pulse (bpm): 93 Source: Measured Respiratory Rate (breaths/min): 18 Weight (lbs): 199 Blood Pressure (mmHg): 145/81 Source: Measured Reference Range: 80 - 120 mg / dl Body Mass Index (BMI): 38.9 Electronic Signature(s) Signed: 10/21/2016 5:10:42 PM By: Curtis Sites Entered By: Curtis Sites on 10/21/2016 08:24:58  No Periwound Skin Color: Atrophie Blanche: No N/A N/A Cyanosis: No Ecchymosis: No Erythema: No Hemosiderin Staining: No Mottled: No Pallor: No Rubor: No Temperature: No Abnormality N/A N/A Tenderness on Yes N/A N/A Palpation: Wound Preparation: Ulcer Cleansing: N/A N/A Rinsed/Irrigated with Saline Topical Anesthetic Applied: Other: lidocaine 4% Treatment Notes Electronic Signature(s) Signed: 10/21/2016 8:56:01 AM By: Evlyn Kanner MD, FACS Entered By: Evlyn Kanner on 10/21/2016 08:56:01 Kara Kelley ([id number]) -------------------------------------------------------------------------------- Multi-Disciplinary Care Plan Details Patient Name: Kara Kelley Date of Service: 10/21/2016 8:00 AM Medical Record Number: [id number] Patient Account Number: [id number] Date of Birth/Sex: [birth year] (61 y.o. Female) Treating RN: Curtis Sites Primary Care Isabellarose Kope: Dorothyann Peng Other Clinician: Referring [place] Breault: Arnette Felts Treating Macall Mccroskey/Extender: Rudene Re in Treatment: 0 Active Inactive Electronic Signature(s) Signed: 11/09/2016 1:32:28 PM By: Elliot Gurney RN, BSN, Kim RN, BSN Signed: 01/19/2017 4:57:27 PM By: Curtis Sites Previous Signature: 10/21/2016 5:10:42 PM Version By: Curtis Sites Entered By: Elliot Gurney RN, BSN, Kim on 11/09/2016 13:32:27 Kara Kelley  ([id number]) -------------------------------------------------------------------------------- Pain Assessment Details Patient Name: Kara Kelley Date of Service: 10/21/2016 8:00 AM Medical Record Number: [id number] Patient Account Number: [id number] Date of Birth/Sex: 04-03-1956 (61 y.o. Female) Treating RN: Curtis Sites Primary Care Physician: Dorothyann Peng Other Clinician: Referring Physician: Arnette Felts Treating Physician/Extender: Rudene Re in Treatment: 0 Active Problems Location of Pain Severity and Description of Pain Patient Has Paino Yes Site Locations Pain Location: Pain in Ulcers With Dressing Change: Yes Duration of the Pain. Constant / Intermittento Constant Pain Management and Medication Current Pain Management: Notes Topical or injectable lidocaine is offered to patient for acute pain when surgical debridement is performed. If needed, Patient is instructed to use over the counter pain medication for the following 24-48 hours after debridement. Wound care MDs do not prescribed pain medications. Patient has chronic pain or uncontrolled pain. Patient has been instructed to make an appointment with their Primary Care Physician for pain management. Electronic Signature(s) Signed: 10/21/2016 5:10:42 PM By: Curtis Sites Entered By: Curtis Sites on 10/21/2016 08:21:37 Kara Kelley ([id number]) -------------------------------------------------------------------------------- Patient/Caregiver Education Details Patient Name: Kara Kelley Date of Service: 10/21/2016 8:00 AM Medical Record Number: [id number] Patient Account Number: [id number] Date of Birth/Gender: 01/06/1956 (61 y.o. Female) Treating RN: Curtis Sites Primary Care Physician: Dorothyann Peng Other Clinician: Referring Physician: Arnette Felts Treating Physician/Extender: Rudene Re in Treatment: 0 Education Assessment Education Provided To: Patient and  Caregiver Education Topics Provided Wound/Skin Impairment: Handouts: Other: wound care as ordered Methods: Demonstration, Explain/Verbal Responses: State content correctly Electronic Signature(s) Signed: 10/21/2016 5:10:42 PM By: Curtis Sites Entered By: Curtis Sites on 10/21/2016 09:11:28 Kara Kelley ([id number]) -------------------------------------------------------------------------------- Wound Assessment Details Patient Name: Kara Kelley Date of Service: 10/21/2016 8:00 AM Medical Record Number: [id number] Patient Account Number: [id number] Date of Birth/Sex: May 04, 1956 (61 y.o. Female) Treating RN: Curtis Sites Primary Care Physician: Dorothyann Peng Other Clinician: Referring Physician: Arnette Felts Treating Physician/Extender: Rudene Re in Treatment: 0 Wound Status Wound Number: 1 Primary Cyst Etiology: Wound Location: Sacrum - Midline Wound Status: Open Wounding Event: Pimple Comorbid Hypertension, Hepatitis C, Type II Date Acquired: 10/09/2016 History: Diabetes Weeks Of Treatment: 0 Clustered Wound: No Photos Wound Measurements Length: (cm) 1.5 Width: (cm) 1 Depth: (cm) 4.5 Area: (cm) 1.178 Volume: (cm) 5.301 % Reduction in Area: 0% % Reduction in Volume: 0% Epithelialization: None Tunneling: No Undermining: No Wound Description Full Thickness Without Exposed Classification: Support Structures Wound Margin: Flat and Intact Exudate Large Amount: Exudate Type: Serous Exudate Color: amber  BRINDY, HIGGINBOTHAM ([id number]) Visit Report for 10/21/2016 Allergy List Details Patient Name: YUI, MULVANEY. Date of Service: 10/21/2016 8:00 AM Medical Record Number: [id number] Patient Account Number: [id number] Date of Birth/Sex: 09-07-1956 (61 y.o. Female) Treating RN: Curtis Sites Primary Care Physician: Dorothyann Peng Other Clinician: Referring Physician: Arnette Felts Treating Physician/Extender: Rudene Re in Treatment: 0 Allergies Active Allergies No Known Drug Allergies Allergy Notes Electronic Signature(s) Signed: 10/21/2016 5:10:42 PM By: Curtis Sites Entered By: Curtis Sites on 10/21/2016 08:21:13 Kara Kelley ([id number]) -------------------------------------------------------------------------------- Arrival Information Details Patient Name: Kara Kelley Date of Service: 10/21/2016 8:00 AM Medical Record Number: [id number] Patient Account Number: [id number] Date of Birth/Sex: [birth year] (61 y.o. Female) Treating RN: Curtis Sites Primary Care Physician: Dorothyann Peng Other Clinician: Referring Physician: Arnette Felts Treating Physician/Extender: Rudene Re in Treatment: 0 Visit Information Patient Arrived: Cane Arrival Time: 08:20 Accompanied By: self Transfer Assistance: None Patient Identification Verified: Yes Secondary Verification Process Completed: Yes Patient Has Alerts: Yes Patient Alerts: DMII Electronic Signature(s) Signed: 10/21/2016 5:10:42 PM By: Curtis Sites Entered By: Curtis Sites on 10/21/2016 08:20:55 Kara Kelley ([id number]) -------------------------------------------------------------------------------- Clinic Level of Care Assessment Details Patient Name: Kara Kelley Date of Service: 10/21/2016 8:00 AM Medical Record Number: [id number] Patient Account Number: [id number] Date of Birth/Sex: 01-05-1956 (61 y.o. Female) Treating RN: Curtis Sites Primary Care Physician: Dorothyann Peng Other Clinician: Referring Physician: Arnette Felts Treating Physician/Extender: Rudene Re in Treatment: 0 Clinic Level of Care Assessment Items TOOL 2 Quantity Score []  - Use when only an EandM is performed on the INITIAL visit 0 ASSESSMENTS - Nursing Assessment / Reassessment X - General Physical Exam (combine w/ comprehensive assessment (listed just 1 20 below) when performed on new pt. evals) X - Comprehensive Assessment (HX, ROS, Risk Assessments, Wounds Hx, etc.) 1 25 ASSESSMENTS - Wound and Skin Assessment / Reassessment X - Simple Wound Assessment / Reassessment - one wound 1 5 []  - Complex Wound Assessment / Reassessment - multiple wounds 0 []  - Dermatologic / Skin Assessment (not related to wound area) 0 ASSESSMENTS - Ostomy and/or Continence Assessment and Care []  - Incontinence Assessment and Management 0 []  - Ostomy Care Assessment and Management (repouching, etc.) 0 PROCESS - Coordination of Care X - Simple Patient / Family Education for ongoing care 1 15 []  - Complex (extensive) Patient / Family Education for ongoing care 0 X - Staff obtains Chiropractor, Records, Test Results / Process Orders 1 10 []  - Staff telephones HHA, Nursing Homes / Clarify orders / etc 0 []  - Routine Transfer to another Facility (non-emergent condition) 0 []  - Routine Hospital Admission (non-emergent condition) 0 []  - New Admissions / Manufacturing engineer / Ordering NPWT, Apligraf, etc. 0 []  - Emergency Hospital Admission (emergent condition) 0 X - Simple Discharge Coordination 1 10 JOLINE, ENCALADA V. ([id number]) []  - Complex (extensive) Discharge Coordination 0 PROCESS - Special Needs []  - Pediatric / Minor Patient Management 0 []  - Isolation Patient Management 0 []  - Hearing / Language / Visual special needs 0 []  - Assessment of Community assistance (transportation, D/C planning, etc.) 0 []  - Additional assistance / Altered mentation 0 []  - Support Surface(s) Assessment  (bed, cushion, seat, etc.) 0 INTERVENTIONS - Wound Cleansing / Measurement X - Wound Imaging (photographs - any number of wounds) 1 5 []  - Wound Tracing (instead of photographs) 0 X - Simple Wound Measurement - one wound 1 5 []  - Complex Wound Measurement - multiple wounds 0 X - Simple Wound Cleansing -

## 2016-10-22 NOTE — Progress Notes (Signed)
Kara Kelley, Reina V. ([id number]) Visit Report for 10/21/2016 Abuse/Suicide Risk Screen Details Patient Name: Kara Kelley, [name] Date of Service: 10/21/2016 8:00 AM Medical Record Number: [id number] Patient Account Number: [id number] Date of Birth/Sex: 1955-12-06 (61 y.o. Female) Treating RN: Curtis Sitesorthy, Joanna Primary Care Physician: Dorothyann PengSANDERS, ROBYN Other Clinician: Referring Physician: Arnette FeltsMOORE, JANECE Treating Physician/Extender: Rudene ReBritto, Errol Weeks in Treatment: 0 Abuse/Suicide Risk Screen Items Answer ABUSE/SUICIDE RISK SCREEN: Has anyone close to you tried to hurt or harm you recentlyo No Do you feel uncomfortable with anyone in your familyo No Has anyone forced you do things that you didnot want to doo No Do you have any thoughts of harming yourselfo No Patient displays signs or symptoms of abuse and/or neglect. No Electronic Signature(s) Signed: 10/21/2016 5:10:42 PM By: Curtis Sitesorthy, Joanna Entered By: Curtis Sitesorthy, Joanna on 10/21/2016 08:28:43 Kara SousaANION, Carla V. ([id number]) -------------------------------------------------------------------------------- Activities of Daily Living Details Patient Name: Kara SousaANION, Maham V. Date of Service: 10/21/2016 8:00 AM Medical Record Number: [id number] Patient Account Number: [id number] Date of Birth/Sex: 1955-12-06 (61 y.o. Female) Treating RN: Curtis Sitesorthy, Joanna Primary Care Physician: Dorothyann PengSANDERS, ROBYN Other Clinician: Referring Physician: Arnette FeltsMOORE, JANECE Treating Physician/Extender: Rudene ReBritto, Errol Weeks in Treatment: 0 Activities of Daily Living Items Answer Activities of Daily Living (Please select one for each item) Drive Automobile Completely Able Take Medications Completely Able Use Telephone Completely Able Care for Appearance Completely Able Use Toilet Completely Able Bath / Shower Completely Able Dress Self Completely Able Feed Self Completely Able Walk Completely Able Get In / Out Bed Completely Able Housework Completely Able Prepare Meals  Completely Able Handle Money Completely Able Shop for Self Completely Able Electronic Signature(s) Signed: 10/21/2016 5:10:42 PM By: Curtis Sitesorthy, Joanna Entered By: Curtis Sitesorthy, Joanna on 10/21/2016 08:29:03 Kara SousaANION, [name] ([id number]) -------------------------------------------------------------------------------- Education Assessment Details Patient Name: Kara SousaANION, Kara V. Date of Service: 10/21/2016 8:00 AM Medical Record Number: [id number] Patient Account Number: [id number] Date of Birth/Sex: 1955-12-06 (61 y.o. Female) Treating RN: Curtis Sitesorthy, Joanna Primary Care Physician: Dorothyann PengSANDERS, ROBYN Other Clinician: Referring Physician: Arnette FeltsMOORE, JANECE Treating Physician/Extender: Rudene ReBritto, Errol Weeks in Treatment: 0 Primary Learner Assessed: Patient Learning Preferences/Education Level/Primary Language Learning Preference: Explanation, Demonstration Highest Education Level: High School Preferred Language: English Cognitive Barrier Assessment/Beliefs Language Barrier: No Translator Needed: No Memory Deficit: No Emotional Barrier: No Cultural/Religious Beliefs Affecting Medical No Care: Physical Barrier Assessment Impaired Vision: No Impaired Hearing: No Decreased Hand dexterity: No Knowledge/Comprehension Assessment Knowledge Level: Medium Comprehension Level: Medium Ability to understand written Medium instructions: Ability to understand verbal Medium instructions: Motivation Assessment Anxiety Level: Calm Cooperation: Cooperative Education Importance: Acknowledges Need Interest in Health Problems: Asks Questions Perception: Coherent Willingness to Engage in Self- Medium Management Activities: Readiness to Engage in Self- Medium Management Activities: Electronic Signature(s) Kara Kelley, [name] ([id number]) Signed: 10/21/2016 5:10:42 PM By: Curtis Sitesorthy, Joanna Entered By: Curtis Sitesorthy, Joanna on 10/21/2016 08:29:38 Kara SousaANION, [name]  ([id number]) -------------------------------------------------------------------------------- Fall Risk Assessment Details Patient Name: Kara SousaANION, Kara V. Date of Service: 10/21/2016 8:00 AM Medical Record Number: [id number] Patient Account Number: [id number] Date of Birth/Sex: 1955-12-06 (61 y.o. Female) Treating RN: Curtis Sitesorthy, Joanna Primary Care Physician: Dorothyann PengSANDERS, ROBYN Other Clinician: Referring Physician: Arnette FeltsMOORE, JANECE Treating Physician/Extender: Rudene ReBritto, Errol Weeks in Treatment: 0 Fall Risk Assessment Items Have you had 2 or more falls in the last 12 monthso 0 No Have you had any fall that resulted in injury in the last 12 monthso 0 No FALL RISK ASSESSMENT: History of falling - immediate or within 3 months 25 Yes Secondary diagnosis 0 No Ambulatory aid None/bed rest/wheelchair/nurse 0 No Crutches/cane/walker 15 Yes Furniture 0  No IV Access/Saline Lock 0 No Gait/Training Normal/bed rest/immobile 0 No Weak 10 Yes Impaired 0 No Mental Status Oriented to own ability 0 Yes Electronic Signature(s) Signed: 10/21/2016 5:10:42 PM By: Curtis Sites Entered By: Curtis Sites on 10/21/2016 08:29:53 Kara Sousa ([id number]) -------------------------------------------------------------------------------- Nutrition Risk Assessment Details Patient Name: Kara Sousa Date of Service: 10/21/2016 8:00 AM Medical Record Number: [id number] Patient Account Number: [id number] Date of Birth/Sex: 09-08-1956 (61 y.o. Female) Treating RN: Curtis Sites Primary Care Physician: Dorothyann Peng Other Clinician: Referring Physician: Arnette Felts Treating Physician/Extender: Rudene Re in Treatment: 0 Height (in): 60 Weight (lbs): 199 Body Mass Index (BMI): 38.9 Nutrition Risk Assessment Items NUTRITION RISK SCREEN: I have an illness or condition that made me change the kind and/or 0 No amount of food I eat I eat fewer than two meals per day 0 No I eat few fruits and  vegetables, or milk products 0 No I have three or more drinks of beer, liquor or wine almost every day 0 No I have tooth or mouth problems that make it hard for me to eat 0 No I don't always have enough money to buy the food I need 0 No I eat alone most of the time 0 No I take three or more different prescribed or over-the-counter drugs a 1 Yes day Without wanting to, I have lost or gained 10 pounds in the last six 0 No months I am not always physically able to shop, cook and/or feed myself 0 No Nutrition Protocols Good Risk Protocol 0 No interventions needed Moderate Risk Protocol Electronic Signature(s) Signed: 10/21/2016 5:10:42 PM By: Curtis Sites Entered By: Curtis Sites on 10/21/2016 08:30:02

## 2016-10-27 ENCOUNTER — Encounter (HOSPITAL_BASED_OUTPATIENT_CLINIC_OR_DEPARTMENT_OTHER): Payer: Medicaid Other

## 2016-10-28 ENCOUNTER — Ambulatory Visit: Payer: Medicaid Other | Admitting: Surgery

## 2016-10-31 ENCOUNTER — Ambulatory Visit: Payer: Medicaid Other | Admitting: Surgery

## 2016-11-07 ENCOUNTER — Encounter: Payer: Self-pay | Admitting: Internal Medicine

## 2016-11-07 ENCOUNTER — Encounter (HOSPITAL_BASED_OUTPATIENT_CLINIC_OR_DEPARTMENT_OTHER): Payer: Medicaid Other

## 2016-11-15 ENCOUNTER — Encounter (HOSPITAL_BASED_OUTPATIENT_CLINIC_OR_DEPARTMENT_OTHER): Payer: Medicaid Other | Attending: Internal Medicine

## 2017-03-01 ENCOUNTER — Ambulatory Visit
Admission: RE | Admit: 2017-03-01 | Discharge: 2017-03-01 | Disposition: A | Discharge status: Home / Self Care | Payer: Medicaid Other | Source: Ambulatory Visit | Attending: Nurse Practitioner | Admitting: Nurse Practitioner

## 2017-03-01 ENCOUNTER — Other Ambulatory Visit: Payer: Self-pay | Admitting: Nurse Practitioner

## 2017-03-01 DIAGNOSIS — M79604 Pain in right leg: Secondary | ICD-10-CM

## 2017-03-08 ENCOUNTER — Other Ambulatory Visit: Payer: Self-pay | Admitting: Nurse Practitioner

## 2017-03-08 DIAGNOSIS — R1031 Right lower quadrant pain: Secondary | ICD-10-CM

## 2017-03-09 ENCOUNTER — Ambulatory Visit: Payer: Medicaid Other | Admitting: Podiatry

## 2017-03-14 ENCOUNTER — Ambulatory Visit
Admission: RE | Admit: 2017-03-14 | Discharge: 2017-03-14 | Disposition: A | Discharge status: Home / Self Care | Payer: Medicaid Other | Source: Ambulatory Visit | Attending: Nurse Practitioner | Admitting: Nurse Practitioner

## 2017-03-14 DIAGNOSIS — R1031 Right lower quadrant pain: Secondary | ICD-10-CM

## 2017-04-27 ENCOUNTER — Ambulatory Visit
Admission: RE | Admit: 2017-04-27 | Discharge: 2017-04-27 | Disposition: A | Discharge status: Home / Self Care | Payer: Medicaid Other | Source: Ambulatory Visit | Attending: Nurse Practitioner | Admitting: Nurse Practitioner

## 2017-04-27 DIAGNOSIS — Z1231 Encounter for screening mammogram for malignant neoplasm of breast: Secondary | ICD-10-CM

## 2017-05-02 ENCOUNTER — Encounter (HOSPITAL_COMMUNITY): Payer: Self-pay

## 2017-05-02 ENCOUNTER — Encounter: Payer: Self-pay | Admitting: Obstetrics & Gynecology

## 2017-05-02 ENCOUNTER — Ambulatory Visit (INDEPENDENT_AMBULATORY_CARE_PROVIDER_SITE_OTHER): Payer: Medicaid Other | Admitting: Obstetrics & Gynecology

## 2017-05-02 VITALS — BP 150/80 | HR 76 | Ht 60.0 in | Wt 191.4 lb

## 2017-05-02 DIAGNOSIS — M25551 Pain in right hip: Secondary | ICD-10-CM | POA: Diagnosis not present

## 2017-05-02 DIAGNOSIS — N9489 Other specified conditions associated with female genital organs and menstrual cycle: Secondary | ICD-10-CM | POA: Diagnosis present

## 2017-05-02 NOTE — Progress Notes (Signed)
Subjective:     Kara Kelley is a 61 y.o. female here for a routine exam. LMP ~10 years prev. Pt denies bleeding since that time.  Current complaints: pain in right groin for 2 months .  Now it is difficult to walk . Pt was told that it may be a lymph node on the right side.  The pt reports that she had a fall shortly before the pain began. She fell on her RIGHT hip. She said an x-ray was done (the x-ray was of the LEFT hip but the pt and her daughters are 100% sure that her fall was on her RIGHT hip).      Gynecologic History No LMP recorded. Patient is postmenopausal. Contraception: post menopausal status [facility] . Results were: normal Last mammogram: 04/27/2017. Results were: normal  Obstetric History OB History  Gravida Para Term Preterm AB Living  4 3     1 3   SAB TAB Ectopic Multiple Live Births    1          # Outcome Date GA Lbr Len/2nd Weight Sex Delivery Anes PTL Lv  4 TAB           3 Para           2 Para           1 Para              The following portions of the patient's history were reviewed and updated as appropriate: allergies, current medications, past family history, past medical history, past social history, past surgical history and problem list.  Review of Systems Pertinent items are noted in HPI.    Objective:  BP (!) 150/80   Pulse 76   Ht 5' (1.524 m)   Wt 191 lb 6.4 oz (86.8 kg)   BMI 37.38 kg/m   CONSTITUTIONAL: Well-developed, well-nourished female in no acute distress.  HENT:  Normocephalic, atraumatic EYES: Conjunctivae and EOM are normal. No scleral icterus.  NECK: Normal range of motion SKIN: Skin is warm and dry. No rash noted. Not diaphoretic.No pallor. NEUROLGIC: Alert and oriented to person, place, and time. Normal coordination.  GU: EGBUS: no lesions; there is no palpable LA of the right or the left groin   Vagina: no blood in vault Cervix: no lesion; no mucopurulent d/c Uterus: small, mobile Adnexa: no masses;  sl tender  Ext: there is tenderness to palpation over the RIGHT hip.    03/14/2017 CLINICAL DATA:  Initial evaluation for right lower quadrant pain for 2 weeks. History of prior C-section.  EXAM: TRANSABDOMINAL AND TRANSVAGINAL ULTRASOUND OF PELVIS  TECHNIQUE: Both transabdominal and transvaginal ultrasound examinations of the pelvis were performed. Transabdominal technique was performed for global imaging of the pelvis including uterus, ovaries, adnexal regions, and pelvic cul-de-sac. It was necessary to proceed with endovaginal exam following the transabdominal exam to visualize the the absent ovaries.  COMPARISON:  Prior CT from 05/22/2011.  FINDINGS: Uterus  Measurements: 7.2 x 3.3 x 4.2 cm. No fibroids or other mass visualized.  Endometrium  Thickness: 8.4 mm. Complex debris and/or fluid at the mid- superior aspect of the endometrial canal present (image 28). Possible associated feeding vessel (image 27).  Right ovary  Not visualized.  No adnexal mass.  Left ovary  Not visualized.  No adnexal mass.  Other findings  No abnormal free fluid.  IMPRESSION: 1. Focal complex echotexture and/or fluid within the mid and upper aspect of the endometrial canal  with possible associated vascularity. While this finding may reflect a benign endometrial polyp, possible endometrial malignancy could also have this appearance. Gynecologic referral with further evaluation with sonohysterography recommended. 2. No other acute abnormality within the pelvis. 3. Nonvisualization of the ovaries. No adnexal mass or abnormal free fluid.  Assessment:  Right hip/groin pain- I see no GYN pathology as the etiology of this.  Rec a RIGHT hip x-ray as it appears that the contralateral side was imaged.   Endometrial polyp/mass- given pts post menopausal state, rec eval. Pt has opted for hysteroscopy with eval and polypectomy.  I have tried to make it clear that this does not  appear to be at all related to the right hip pathology.      Plan:   F/u with primary care to discuss further eval of the hip pain.  Patient desires surgical management with hysteroscopy with polypectomy.  The risks of surgery were discussed in detail with the patient including but not limited to: bleeding which may require transfusion or reoperation; infection which may require prolonged hospitalization or re-hospitalization and antibiotic therapy; injury to bowel, bladder, ureters and major vessels or other surrounding organs; need for additional procedures including laparotomy; thromboembolic phenomenon, incisional problems and other postoperative or anesthesia complications.  Patient was told that the likelihood that her condition and symptoms will be treated effectively with this surgical management was very high; the postoperative expectations were also discussed in detail. The patient also understands the alternative treatment options which were discussed in full. All questions were answered.  She was told that she will be contacted by our surgical scheduler regarding the time and date of her surgery; routine preoperative instructions of having nothing to eat or drink after midnight on the day prior to surgery and also coming to the hospital 1 1/2 hours prior to her time of surgery were also emphasized.  She was told she may be called for a preoperative appointment about a week prior to surgery and will be given further preoperative instructions at that visit. Printed patient education handouts about the procedure were given to the patient to review at home.  Kara Kelley, M.D., [facility]

## 2017-05-02 NOTE — Patient Instructions (Signed)
Hysteroscopy  Hysteroscopy is a procedure used for looking inside the womb (uterus). It may be done for various reasons, including:  · To evaluate abnormal bleeding, fibroid (benign, noncancerous) tumors, polyps, scar tissue (adhesions), and possibly cancer of the uterus.  · To look for lumps (tumors) and other uterine growths.  · To look for causes of why a woman cannot get pregnant (infertility), causes of recurrent loss of pregnancy (miscarriages), or a lost intrauterine device (IUD).  · To perform a sterilization by blocking the fallopian tubes from inside the uterus.    In this procedure, a thin, flexible tube with a tiny light and camera on the end of it (hysteroscope) is used to look inside the uterus. A hysteroscopy should be done right after a menstrual period to be sure you are not pregnant.  LET YOUR HEALTH CARE PROVIDER KNOW ABOUT:  · Any allergies you have.  · All medicines you are taking, including vitamins, herbs, eye drops, creams, and over-the-counter medicines.  · Previous problems you or members of your family have had with the use of anesthetics.  · Any blood disorders you have.  · Previous surgeries you have had.  · Medical conditions you have.  RISKS AND COMPLICATIONS  Generally, this is a safe procedure. However, as with any procedure, complications can occur. Possible complications include:  · Putting a hole in the uterus.  · Excessive bleeding.  · Infection.  · Damage to the cervix.  · Injury to other organs.  · Allergic reaction to medicines.  · Too much fluid used in the uterus for the procedure.    BEFORE THE PROCEDURE  · Ask your health care provider about changing or stopping any regular medicines.  · Do not take aspirin or blood thinners for 1 week before the procedure, or as directed by your health care provider. These can cause bleeding.  · If you smoke, do not smoke for 2 weeks before the procedure.  · In some cases, a medicine is placed in the cervix the day before the procedure.  This medicine makes the cervix have a larger opening (dilate). This makes it easier for the instrument to be inserted into the uterus during the procedure.  · Do not eat or drink anything for at least 8 hours before the surgery.  · Arrange for someone to take you home after the procedure.  PROCEDURE  · You may be given a medicine to relax you (sedative). You may also be given one of the following:  ? A medicine that numbs the area around the cervix (local anesthetic).  ? A medicine that makes you sleep through the procedure (general anesthetic).  · The hysteroscope is inserted through the vagina into the uterus. The camera on the hysteroscope sends a picture to a TV screen. This gives the surgeon a good view inside the uterus.  · During the procedure, air or a liquid is put into the uterus, which allows the surgeon to see better.  · Sometimes, tissue is gently scraped from inside the uterus. These tissue samples are sent to a lab for testing.  What to expect after the procedure  · If you had a general anesthetic, you may be groggy for a couple hours after the procedure.  · If you had a local anesthetic, you will be able to go home as soon as you are stable and feel ready.  · You may have some cramping. This normally lasts for a couple days.  · You may   have bleeding, which varies from light spotting for a few days to menstrual-like bleeding for 3-7 days. This is normal.  · If your test results are not back during the visit, make an appointment with your health care provider to find out the results.  This information is not intended to replace advice given to you by your health care provider. Make sure you discuss any questions you have with your health care provider.  Document Released: 01/02/2001 Document Revised: 03/03/2016 Document Reviewed: 04/25/2013  Elsevier Interactive Patient Education © 2017 [employer]

## 2017-05-18 ENCOUNTER — Encounter: Payer: Medicaid Other | Admitting: Obstetrics & Gynecology

## 2017-05-24 ENCOUNTER — Other Ambulatory Visit: Payer: Self-pay | Admitting: Neurosurgery

## 2017-05-25 ENCOUNTER — Telehealth: Payer: Self-pay | Admitting: Obstetrics & Gynecology

## 2017-05-25 NOTE — Telephone Encounter (Signed)
Have questions regarding her surgery she said its on the 11th all I see is a appt with Dr.Harraway-Smith on the 12th.  Please Call patient

## 2017-05-29 NOTE — Telephone Encounter (Signed)
I called Kara Kelley and she states she has back surgery scheduled on 07/03/17 for a ruptured disc and that doctor wanted her to check with her gyn doctor if that surgery was too soon to be done after her gyn surgery on 06/20/17. Per review back surgery is PLIF L4 and L5. Informed her I will send message to Dr. Erin Fulling and we will get back to her, but call us if she doesn't hear back by end of week. She voices understanding.

## 2017-05-31 NOTE — Telephone Encounter (Signed)
Patient returned Linda's call. I passed on Dr Danne Harbor recommendation that she may have surgery within 48 hours of her gyn procedure. Patient voiced understanding and had no further questions.

## 2017-05-31 NOTE — Telephone Encounter (Signed)
Kara Rosenthal, MD  Gerome Apley, RN        In response to your question regarding this pt:   She can surgery within 48 hours of her GYN procedure procedure. If she was working, she would be able to return the next day.   clh-[id number]     I called Viviana and left a message I am calling her back with some information she requested, please call our office.

## 2017-06-13 ENCOUNTER — Encounter (HOSPITAL_COMMUNITY): Payer: Self-pay

## 2017-06-13 ENCOUNTER — Encounter (HOSPITAL_COMMUNITY)
Admission: RE | Admit: 2017-06-13 | Discharge: 2017-06-13 | Disposition: A | Discharge status: Home / Self Care | Payer: Medicaid Other | Source: Ambulatory Visit | Attending: Obstetrics & Gynecology | Admitting: Obstetrics & Gynecology

## 2017-06-13 DIAGNOSIS — M21969 Unspecified acquired deformity of unspecified lower leg: Secondary | ICD-10-CM | POA: Diagnosis not present

## 2017-06-13 DIAGNOSIS — I10 Essential (primary) hypertension: Secondary | ICD-10-CM | POA: Diagnosis not present

## 2017-06-13 DIAGNOSIS — B182 Chronic viral hepatitis C: Secondary | ICD-10-CM | POA: Insufficient documentation

## 2017-06-13 DIAGNOSIS — L84 Corns and callosities: Secondary | ICD-10-CM | POA: Insufficient documentation

## 2017-06-13 DIAGNOSIS — M25579 Pain in unspecified ankle and joints of unspecified foot: Secondary | ICD-10-CM | POA: Insufficient documentation

## 2017-06-13 DIAGNOSIS — E785 Hyperlipidemia, unspecified: Secondary | ICD-10-CM | POA: Diagnosis not present

## 2017-06-13 DIAGNOSIS — D649 Anemia, unspecified: Secondary | ICD-10-CM | POA: Insufficient documentation

## 2017-06-13 DIAGNOSIS — M25879 Other specified joint disorders, unspecified ankle and foot: Secondary | ICD-10-CM | POA: Insufficient documentation

## 2017-06-13 DIAGNOSIS — Z01812 Encounter for preprocedural laboratory examination: Secondary | ICD-10-CM | POA: Insufficient documentation

## 2017-06-13 DIAGNOSIS — K219 Gastro-esophageal reflux disease without esophagitis: Secondary | ICD-10-CM | POA: Diagnosis not present

## 2017-06-13 HISTORY — DX: Respiratory tuberculosis unspecified: A15.9

## 2017-06-13 HISTORY — DX: Headache, unspecified: R51.9

## 2017-06-13 HISTORY — DX: Chronic obstructive pulmonary disease, unspecified: J44.9

## 2017-06-13 HISTORY — DX: Anxiety disorder, unspecified: F41.9

## 2017-06-13 HISTORY — DX: Insomnia, unspecified: G47.00

## 2017-06-13 HISTORY — DX: Headache: R51

## 2017-06-13 LAB — COMPREHENSIVE METABOLIC PANEL
ALBUMIN: 3.9 g/dL (ref 3.5–5.0)
ALK PHOS: 112 U/L (ref 38–126)
ALT: 36 U/L (ref 14–54)
AST: 37 U/L (ref 15–41)
Anion gap: 9 (ref 5–15)
BILIRUBIN TOTAL: 0.8 mg/dL (ref 0.3–1.2)
BUN: 12 mg/dL (ref 6–20)
CALCIUM: 9.1 mg/dL (ref 8.9–10.3)
CO2: 25 mmol/L (ref 22–32)
Chloride: 105 mmol/L (ref 101–111)
Creatinine, Ser: 0.9 mg/dL (ref 0.44–1.00)
GFR calc Af Amer: >60 mL/min (ref >60)
GLUCOSE: 191 mg/dL — AB (ref 65–99)
Potassium: 4.2 mmol/L (ref 3.5–5.1)
Sodium: 139 mmol/L (ref 135–145)
TOTAL PROTEIN: 7.9 g/dL (ref 6.5–8.1)

## 2017-06-13 LAB — CBC
HEMATOCRIT: 34.5 % — AB (ref 36.0–46.0)
HEMOGLOBIN: 11.7 g/dL — AB (ref 12.0–15.0)
MCH: 25.6 pg — ABNORMAL LOW (ref 26.0–34.0)
MCHC: 33.9 g/dL (ref 30.0–36.0)
MCV: 75.5 fL — AB (ref 78.0–100.0)
Platelets: 101 10*3/uL — ABNORMAL LOW (ref 150–400)
RBC: 4.57 MIL/uL (ref 3.87–5.11)
RDW: 14.3 % (ref 11.5–15.5)
WBC: 5.2 10*3/uL (ref 4.0–10.5)

## 2017-06-13 NOTE — Patient Instructions (Addendum)
Your procedure is scheduled on:  Tuesday, Sept. 11, 2018  [facility] at:  8:15 AM  Pick up the phone at the desk and dial [phone].  Call this number if you have problems the morning of surgery: [phone].  Remember: Do NOT eat food or drink after:  Midnight Monday  Take these medicines the morning of surgery with a SIP OF WATER:  Lipitor, Wellbutrin, Lisinopril, Omeprazole  Take half (1/2) bedtime insulin dose the night before surgery  Do NOT take Metformin the evening before surgery  Bring Asthma Inhaler day of surgery  Stop ALL herbal medications at this time  Do NOT smoke the day of surgery.  Do NOT wear jewelry (body piercing), metal hair clips/bobby pins, make-up, artifical eyelashes or nail polish. Do NOT wear lotions, powders, or perfumes.  You may wear deodorant. Do NOT shave for 48 hours prior to surgery. Do NOT bring valuables to the hospital. Contacts, dentures, or bridgework may not be worn into surgery.  Have a responsible adult drive you home and stay with you for 24 hours after your procedure  Bring a copy of your healthcare power of attorney and living will documents.

## 2017-06-19 ENCOUNTER — Encounter (HOSPITAL_COMMUNITY): Payer: Self-pay | Admitting: *Deleted

## 2017-06-20 ENCOUNTER — Ambulatory Visit (HOSPITAL_COMMUNITY): Payer: Medicaid Other | Admitting: Anesthesiology

## 2017-06-20 ENCOUNTER — Ambulatory Visit (HOSPITAL_COMMUNITY)
Admission: AD | Admit: 2017-06-20 | Discharge: 2017-06-20 | Disposition: A | Discharge status: Home / Self Care | Payer: Medicaid Other | Source: Ambulatory Visit | Attending: Obstetrics & Gynecology | Admitting: Obstetrics & Gynecology

## 2017-06-20 ENCOUNTER — Encounter (HOSPITAL_COMMUNITY): Payer: Self-pay | Admitting: Emergency Medicine

## 2017-06-20 ENCOUNTER — Encounter (HOSPITAL_COMMUNITY)
Admission: AD | Disposition: A | Discharge status: Home / Self Care | Payer: Self-pay | Source: Ambulatory Visit | Attending: Obstetrics & Gynecology

## 2017-06-20 DIAGNOSIS — E785 Hyperlipidemia, unspecified: Secondary | ICD-10-CM | POA: Insufficient documentation

## 2017-06-20 DIAGNOSIS — K5900 Constipation, unspecified: Secondary | ICD-10-CM | POA: Insufficient documentation

## 2017-06-20 DIAGNOSIS — N950 Postmenopausal bleeding: Secondary | ICD-10-CM | POA: Diagnosis present

## 2017-06-20 DIAGNOSIS — Z79899 Other long term (current) drug therapy: Secondary | ICD-10-CM | POA: Insufficient documentation

## 2017-06-20 DIAGNOSIS — G43909 Migraine, unspecified, not intractable, without status migrainosus: Secondary | ICD-10-CM | POA: Insufficient documentation

## 2017-06-20 DIAGNOSIS — L600 Ingrowing nail: Secondary | ICD-10-CM | POA: Insufficient documentation

## 2017-06-20 DIAGNOSIS — Z7984 Long term (current) use of oral hypoglycemic drugs: Secondary | ICD-10-CM | POA: Insufficient documentation

## 2017-06-20 DIAGNOSIS — Z87891 Personal history of nicotine dependence: Secondary | ICD-10-CM | POA: Diagnosis not present

## 2017-06-20 DIAGNOSIS — J449 Chronic obstructive pulmonary disease, unspecified: Secondary | ICD-10-CM | POA: Insufficient documentation

## 2017-06-20 DIAGNOSIS — K219 Gastro-esophageal reflux disease without esophagitis: Secondary | ICD-10-CM | POA: Diagnosis not present

## 2017-06-20 DIAGNOSIS — G8929 Other chronic pain: Secondary | ICD-10-CM | POA: Diagnosis not present

## 2017-06-20 DIAGNOSIS — M1990 Unspecified osteoarthritis, unspecified site: Secondary | ICD-10-CM | POA: Insufficient documentation

## 2017-06-20 DIAGNOSIS — N840 Polyp of corpus uteri: Secondary | ICD-10-CM | POA: Diagnosis present

## 2017-06-20 DIAGNOSIS — Q828 Other specified congenital malformations of skin: Secondary | ICD-10-CM | POA: Insufficient documentation

## 2017-06-20 DIAGNOSIS — B351 Tinea unguium: Secondary | ICD-10-CM | POA: Diagnosis not present

## 2017-06-20 DIAGNOSIS — I10 Essential (primary) hypertension: Secondary | ICD-10-CM | POA: Diagnosis not present

## 2017-06-20 DIAGNOSIS — Z9889 Other specified postprocedural states: Secondary | ICD-10-CM

## 2017-06-20 DIAGNOSIS — B182 Chronic viral hepatitis C: Secondary | ICD-10-CM | POA: Insufficient documentation

## 2017-06-20 DIAGNOSIS — F329 Major depressive disorder, single episode, unspecified: Secondary | ICD-10-CM | POA: Insufficient documentation

## 2017-06-20 DIAGNOSIS — F419 Anxiety disorder, unspecified: Secondary | ICD-10-CM | POA: Insufficient documentation

## 2017-06-20 DIAGNOSIS — Z9049 Acquired absence of other specified parts of digestive tract: Secondary | ICD-10-CM | POA: Diagnosis not present

## 2017-06-20 DIAGNOSIS — G4700 Insomnia, unspecified: Secondary | ICD-10-CM | POA: Diagnosis not present

## 2017-06-20 DIAGNOSIS — E109 Type 1 diabetes mellitus without complications: Secondary | ICD-10-CM | POA: Diagnosis not present

## 2017-06-20 DIAGNOSIS — Z8249 Family history of ischemic heart disease and other diseases of the circulatory system: Secondary | ICD-10-CM | POA: Diagnosis not present

## 2017-06-20 DIAGNOSIS — Z833 Family history of diabetes mellitus: Secondary | ICD-10-CM | POA: Diagnosis not present

## 2017-06-20 DIAGNOSIS — D649 Anemia, unspecified: Secondary | ICD-10-CM | POA: Insufficient documentation

## 2017-06-20 DIAGNOSIS — Z8379 Family history of other diseases of the digestive system: Secondary | ICD-10-CM | POA: Insufficient documentation

## 2017-06-20 HISTORY — PX: HYSTEROSCOPY: SHX211

## 2017-06-20 LAB — GLUCOSE, CAPILLARY
GLUCOSE-CAPILLARY: 132 mg/dL — AB (ref 65–99)
Glucose-Capillary: 137 mg/dL — ABNORMAL HIGH (ref 65–99)

## 2017-06-20 SURGERY — HYSTEROSCOPY
Anesthesia: General | Site: Vagina

## 2017-06-20 MED ORDER — LACTATED RINGERS IV SOLN: INTRAVENOUS | Status: DC

## 2017-06-20 MED ORDER — MEPERIDINE HCL 25 MG/ML IJ SOLN: 6.2500 mg | INTRAMUSCULAR | Status: DC | PRN

## 2017-06-20 MED ORDER — FENTANYL CITRATE (PF) 100 MCG/2ML IJ SOLN: 25.0000 ug | INTRAMUSCULAR | Status: DC | PRN

## 2017-06-20 MED ORDER — KETOROLAC TROMETHAMINE 30 MG/ML IJ SOLN
INTRAMUSCULAR | Status: AC
  Filled 2017-06-20: qty 1

## 2017-06-20 MED ORDER — LACTATED RINGERS IV SOLN
INTRAVENOUS | Status: DC
  Administered 2017-06-20: 09:00:00 via INTRAVENOUS

## 2017-06-20 MED ORDER — BUPIVACAINE HCL (PF) 0.5 % IJ SOLN
INTRAMUSCULAR | Status: DC | PRN
  Administered 2017-06-20: 20 mL

## 2017-06-20 MED ORDER — PROPOFOL 10 MG/ML IV BOLUS
INTRAVENOUS | Status: DC | PRN
  Administered 2017-06-20: 170 mg via INTRAVENOUS

## 2017-06-20 MED ORDER — SCOPOLAMINE 1 MG/3DAYS TD PT72
1.0000 | MEDICATED_PATCH | Freq: Once | TRANSDERMAL | Status: DC
Start: 2017-06-20 — End: 2017-06-20

## 2017-06-20 MED ORDER — EPHEDRINE SULFATE 50 MG/ML IJ SOLN
INTRAMUSCULAR | Status: DC | PRN
  Administered 2017-06-20: 10 mg via INTRAVENOUS

## 2017-06-20 MED ORDER — EPHEDRINE 5 MG/ML INJ
INTRAVENOUS | Status: AC
  Filled 2017-06-20: qty 10

## 2017-06-20 MED ORDER — KETOROLAC TROMETHAMINE 30 MG/ML IJ SOLN
30.0000 mg | Freq: Once | INTRAMUSCULAR | Status: AC
  Administered 2017-06-20: 30 mg via INTRAVENOUS

## 2017-06-20 MED ORDER — METOCLOPRAMIDE HCL 5 MG/ML IJ SOLN: 10.0000 mg | Freq: Once | INTRAMUSCULAR | Status: DC | PRN

## 2017-06-20 MED ORDER — MIDAZOLAM HCL 2 MG/2ML IJ SOLN
INTRAMUSCULAR | Status: AC
  Filled 2017-06-20: qty 2

## 2017-06-20 MED ORDER — LIDOCAINE HCL (CARDIAC) 20 MG/ML IV SOLN
INTRAVENOUS | Status: AC
  Filled 2017-06-20: qty 5

## 2017-06-20 MED ORDER — FENTANYL CITRATE (PF) 100 MCG/2ML IJ SOLN
INTRAMUSCULAR | Status: DC | PRN
  Administered 2017-06-20 (×2): 50 ug via INTRAVENOUS

## 2017-06-20 MED ORDER — PROPOFOL 10 MG/ML IV BOLUS
INTRAVENOUS | Status: AC
  Filled 2017-06-20: qty 20

## 2017-06-20 MED ORDER — LIDOCAINE HCL (CARDIAC) 20 MG/ML IV SOLN
INTRAVENOUS | Status: DC | PRN
  Administered 2017-06-20: 60 mg via INTRAVENOUS

## 2017-06-20 MED ORDER — FENTANYL CITRATE (PF) 100 MCG/2ML IJ SOLN
INTRAMUSCULAR | Status: AC
  Filled 2017-06-20: qty 2

## 2017-06-20 MED ORDER — ONDANSETRON HCL 4 MG/2ML IJ SOLN
INTRAMUSCULAR | Status: AC
  Filled 2017-06-20: qty 2

## 2017-06-20 MED ORDER — BUPIVACAINE HCL (PF) 0.5 % IJ SOLN
INTRAMUSCULAR | Status: AC
  Filled 2017-06-20: qty 30

## 2017-06-20 MED ORDER — DEXAMETHASONE SODIUM PHOSPHATE 4 MG/ML IJ SOLN
INTRAMUSCULAR | Status: AC
  Filled 2017-06-20: qty 1

## 2017-06-20 MED ORDER — SODIUM CHLORIDE 0.9 % IR SOLN
Status: DC | PRN
  Administered 2017-06-20: 3000 mL

## 2017-06-20 MED ORDER — ONDANSETRON HCL 4 MG/2ML IJ SOLN
INTRAMUSCULAR | Status: DC | PRN
  Administered 2017-06-20: 4 mg via INTRAVENOUS

## 2017-06-20 SURGICAL SUPPLY — 13 items
CATH ROBINSON RED A/P 16FR (CATHETERS) ×2 IMPLANT
CLOTH BEACON ORANGE TIMEOUT ST (SAFETY) ×2 IMPLANT
CONTAINER PREFILL 10% NBF 60ML (FORM) ×1 IMPLANT
GLOVE BIO SURGEON STRL SZ7 (GLOVE) ×2 IMPLANT
GLOVE BIOGEL PI IND STRL 7.0 (GLOVE) ×2 IMPLANT
GLOVE BIOGEL PI INDICATOR 7.0 (GLOVE) ×2
GOWN STRL REUS W/TWL LRG LVL3 (GOWN DISPOSABLE) ×2 IMPLANT
GOWN STRL REUS W/TWL XL LVL3 (GOWN DISPOSABLE) ×2 IMPLANT
PACK VAGINAL MINOR WOMEN LF (CUSTOM PROCEDURE TRAY) ×2 IMPLANT
PAD OB MATERNITY 4.3X12.25 (PERSONAL CARE ITEMS) ×2 IMPLANT
TOWEL OR 17X24 6PK STRL BLUE (TOWEL DISPOSABLE) ×4 IMPLANT
TUBING AQUILEX INFLOW (TUBING) ×2 IMPLANT
TUBING AQUILEX OUTFLOW (TUBING) ×2 IMPLANT

## 2017-06-20 NOTE — Op Note (Addendum)
06/20/2017  9:49 AM  PATIENT:  Kara Kelley  61 y.o. female  PRE-OPERATIVE DIAGNOSIS:  Endometrial Polyp  POST-OPERATIVE DIAGNOSIS:  Endometrial Polyp  PROCEDURE:  Procedure(s): DILATION AND CURETTAGE, HYSTEROSCOPY w/ Polypectomy (N/A)  SURGEON:  Surgeon(s) and Role:    Willodean Rosenthal* Harraway-Smith, Zykeria Laguardia, MD - Primary  ANESTHESIA:   general and paracervical block  EBL:  No intake/output data recorded.  BLOOD ADMINISTERED:none  DRAINS: none   LOCAL MEDICATIONS USED:  MARCAINE     SPECIMEN:  Source of Specimen:  endometrial currettings and endometrial polyp  DISPOSITION OF SPECIMEN:  PATHOLOGY  COUNTS:  YES  TOURNIQUET:  * No tourniquets in log *  DICTATION: .Note written in EPIC  PLAN OF CARE: Discharge to home after PACU  PATIENT DISPOSITION:  PACU - hemodynamically stable.   Delay start of Pharmacological VTE agent (>24hrs) due to surgical blood loss or risk of bleeding: not applicable  Complications: none immediate  INDICATIONS: 61 y.o. [id number]  here for scheduled surgery for hysteroscopy with polypectomy.   Risks of surgery were discussed with the patient including but not limited to: bleeding which may require transfusion; infection which may require antibiotics; injury to uterus or surrounding organs; intrauterine scarring which may impair future fertility; need for additional procedures including laparotomy or laparoscopy; and other postoperative/anesthesia complications. Written informed consent was obtained.    FINDINGS:  A 8 week size uterus.  Diffuse proliferative endometrium.  4 cm polyp attached to the uterine fundus. Normal ostia bilaterally.  PROCEDURE DETAILS:  The patient was taken to the operating room where general anesthesia was administered with LMA and was found to be adequate.  After an adequate timeout was performed, she was placed in the dorsal lithotomy position and examined; then prepped and draped in the sterile manner.   Her bladder was  catheterized for an unmeasured amount of clear, yellow urine. A speculum was then placed in the patient's vagina.  A paracervical block was performed using .5% Marcaine in the amount of 20 cc at 5 and 7 o'clock.  A single tooth tenaculum was applied to the anterior lip of the cervix.   The cervix was sounded to 8cm and dilated manually with metal dilators to accommodate the 5 mm diagnostic hysteroscope.  Once the cervix was dilated, the hysteroscope was inserted under direct visualization using 1.5% glycine as a suspension medium.  The uterine cavity was carefully examined, both ostia were recognized, and diffusely proliferative endometrium was noted. A large endometrial polyp was noted coming from the fundus.  After further careful visualization of the uterine cavity, the hysteroscope was removed under direct visualization.  A sharp curettage was then performed to obtain a moderate amount of endometrial curettings. A Randall stone forceps was used and the polyp was removed in its entirety in two passes. The hysteroscope was reinserted and the cavity was found to be intact with no lesions or masses appreciated. The tenaculum was removed from the anterior lip of the cervix and the vaginal speculum was removed after noting good hemostasis.  The patient tolerated the procedure well and was taken to the recovery area awake, extubated and in stable condition.  The patient will be discharged to home as per PACU criteria.  Routine postoperative instructions given.    She will follow up in the clinic in 6 weeks for postoperative evaluation.  Kyeshia Zinn L. Harraway-Smith, M.D., [facility]FACOG

## 2017-06-20 NOTE — Brief Op Note (Signed)
06/20/2017  9:49 AM  PATIENT:  Kara Kelley  61 y.o. female  PRE-OPERATIVE DIAGNOSIS:  Endometrial Polyp  POST-OPERATIVE DIAGNOSIS:  Endometrial Polyp  PROCEDURE:  Procedure(s): DILATION AND CURETTAGE, HYSTEROSCOPY w/ Polypectomy (N/A)  SURGEON:  Surgeon(s) and Role:    Willodean Rosenthal* Harraway-Smith, Hue Frick, MD - Primary  ANESTHESIA:   general and paracervical block  EBL:  No intake/output data recorded.  BLOOD ADMINISTERED:none  DRAINS: none   LOCAL MEDICATIONS USED:  MARCAINE     SPECIMEN:  Source of Specimen:  endometrial currettings and endometrial polyp  DISPOSITION OF SPECIMEN:  PATHOLOGY  COUNTS:  YES  TOURNIQUET:  * No tourniquets in log *  DICTATION: .Note written in EPIC  PLAN OF CARE: Discharge to home after PACU  PATIENT DISPOSITION:  PACU - hemodynamically stable.   Delay start of Pharmacological VTE agent (>24hrs) due to surgical blood loss or risk of bleeding: not applicable  Complications: none immediate  Saheed Carrington L. Harraway-Smith, M.D., [facility] CoreFACOG

## 2017-06-20 NOTE — Transfer of Care (Signed)
Immediate Anesthesia Transfer of Care Note  Patient: Kara Kelley  Procedure(s) Performed: Procedure(s): DILATION AND CURETTAGE, HYSTEROSCOPY w/ Polypectomy (N/A)  Patient Location: PACU  Anesthesia Type:General  Level of Consciousness: awake, alert , oriented and patient cooperative  Airway & Oxygen Therapy: Patient Spontanous Breathing and Patient connected to nasal cannula oxygen  Post-op Assessment: Report given to RN and Post -op Vital signs reviewed and stable  Post vital signs: Reviewed and stable  Last Vitals:  Vitals:   06/20/17 0831  BP: 112/77  Pulse: 87  Resp: 16  Temp: 36.6 C  SpO2: 100%    Last Pain:  Vitals:   06/20/17 0831  TempSrc: Oral  PainSc: 8          Complications: No apparent anesthesia complications

## 2017-06-20 NOTE — Anesthesia Procedure Notes (Signed)
Procedure Name: LMA Insertion Date/Time: 06/20/2017 9:28 AM Performed by: Suella GroveMOORE, Gianny Killman C Pre-anesthesia Checklist: Patient identified, Emergency Drugs available, Suction available and Patient being monitored Patient Re-evaluated:Patient Re-evaluated prior to induction Oxygen Delivery Method: Circle system utilized and Simple face mask Preoxygenation: Pre-oxygenation with 100% oxygen Induction Type: IV induction and Inhalational induction Ventilation: Mask ventilation without difficulty LMA: LMA inserted LMA Size: 4.0 Grade View: Grade II Tube type: Oral Number of attempts: 2 Placement Confirmation: positive ETCO2 and breath sounds checked- equal and bilateral Tube secured with: Tape Dental Injury: Bloody posterior oropharynx and Injury to tongue  Difficulty Due To: Difficulty was unanticipated

## 2017-06-20 NOTE — Anesthesia Postprocedure Evaluation (Signed)
Anesthesia Post Note  Patient: Kara Kelley  Procedure(s) Performed: Procedure(s) (LRB): DILATION AND CURETTAGE, HYSTEROSCOPY w/ Polypectomy (N/A)     Patient location during evaluation: PACU Anesthesia Type: General Level of consciousness: awake and alert Pain management: pain level controlled Vital Signs Assessment: post-procedure vital signs reviewed and stable Respiratory status: spontaneous breathing, nonlabored ventilation and respiratory function stable Cardiovascular status: blood pressure returned to baseline and stable Postop Assessment: no signs of nausea or vomiting Anesthetic complications: no    Last Vitals:  Vitals:   06/20/17 1108 06/20/17 1127  BP:  134/75  Pulse:  65  Resp:  18  Temp: 36.7 C   SpO2:  97%    Last Pain:  Vitals:   06/20/17 1127  TempSrc:   PainSc: 2    Pain Goal:                 Beryle Lathehomas E Brock

## 2017-06-20 NOTE — Discharge Instructions (Signed)
Hysteroscopy, Care After °Refer to this sheet in the next few weeks. These instructions provide you with information on caring for yourself after your procedure. Your health care provider may also give you more specific instructions. Your treatment has been planned according to current medical practices, but problems sometimes occur. Call your health care provider if you have any problems or questions after your procedure. °What can I expect after the procedure? °After your procedure, it is typical to have the following: °· You may have some cramping. This normally lasts for a couple days. °· You may have bleeding. This can vary from light spotting for a few days to menstrual-like bleeding for 3-7 days. ° °Follow these instructions at home: °· Rest for the first 1-2 days after the procedure. °· Only take over-the-counter or prescription medicines as directed by your health care provider. Do not take aspirin. It can increase the chances of bleeding. °· Take showers instead of baths for 2 weeks or as directed by your health care provider. °· Do not drive for 24 hours or as directed. °· Do not drink alcohol while taking pain medicine. °· Do not use tampons, douche, or have sexual intercourse for 2 weeks or until your health care provider says it is okay. °· Take your temperature twice a day for 4-5 days. Write it down each time. °· Follow your health care provider's advice about diet, exercise, and lifting. °· If you develop constipation, you may: °? Take a mild laxative if your health care provider approves. °? Add bran foods to your diet. °? Drink enough fluids to keep your urine clear or pale yellow. °· Try to have someone with you or available to you for the first 24-48 hours, especially if you were given a general anesthetic. °· Follow up with your health care provider as directed. °Contact a health care provider if: °· You feel dizzy or lightheaded. °· You feel sick to your stomach (nauseous). °· You have  abnormal vaginal discharge. °· You have a rash. °· You have pain that is not controlled with medicine. °Get help right away if: °· You have bleeding that is heavier than a normal menstrual period. °· You have a fever. °· You have increasing cramps or pain, not controlled with medicine. °· You have new belly (abdominal) pain. °· You pass out. °· You have pain in the tops of your shoulders (shoulder strap areas). °· You have shortness of breath. °This information is not intended to replace advice given to you by your health care provider. Make sure you discuss any questions you have with your health care provider. °Document Released: 07/17/2013 Document Revised: 03/03/2016 Document Reviewed: 04/25/2013 °Elsevier Interactive Patient Education © 2017 [employer] ° ° °Post Anesthesia Home Care Instructions ° °Activity: °Get plenty of rest for the remainder of the day. A responsible individual must stay with you for 24 hours following the procedure.  °For the next 24 hours, DO NOT: °-Drive a car °-Operate machinery °-Drink alcoholic beverages °-Take any medication unless instructed by your physician °-Make any legal decisions or sign important papers. ° °Meals: °Start with liquid foods such as gelatin or soup. Progress to regular foods as tolerated. Avoid greasy, spicy, heavy foods. If nausea and/or vomiting occur, drink only clear liquids until the nausea and/or vomiting subsides. Call your physician if vomiting continues. ° °Special Instructions/Symptoms: °Your throat may feel dry or sore from the anesthesia or the breathing tube placed in your throat during surgery. If this causes discomfort, gargle   with warm salt water. The discomfort should disappear within 24 hours. ° °If you had a scopolamine patch placed behind your ear for the management of post- operative nausea and/or vomiting: ° °1. The medication in the patch is effective for 72 hours, after which it should be removed.  Wrap patch in a tissue and discard in  the trash. Wash hands thoroughly with soap and water. °2. You may remove the patch earlier than 72 hours if you experience unpleasant side effects which may include dry mouth, dizziness or visual disturbances. °3. Avoid touching the patch. Wash your hands with soap and water after contact with the patch. °  ° ° °

## 2017-06-20 NOTE — H&P (Signed)
Preoperative History and Physical  Kara Kelley is a 61 y.o. [phone] here for surgical management of PMPB.   Proposed surgery:  hysteroscopy with polypectomy  Past Medical History:  Diagnosis Date  . Anemia   . Anxiety   . Arthritis   . Asthma   . Bunion   . Callus   . Chronic pain   . Cocaine abuse   . COPD (chronic obstructive pulmonary disease) ([place])   . Corns and callosities   . Degenerative joint disease   . Depression   . Diabetes mellitus   . ETOH abuse   . Gall stones   . GERD (gastroesophageal reflux disease)   . Headache    history of Migraines  . Hepatitis C   . Hyperlipidemia   . Hypertension   . Insomnia   . Substance abuse    alcoholism  . Tuberculosis 1985   Past Surgical History:  Procedure Laterality Date  . CESAREAN SECTION     x3  . CHOLECYSTECTOMY N/A 08/11/2016   Procedure: LAPAROSCOPIC CHOLECYSTECTOMY WITH   INTRAOPERATIVE CHOLANGIOGRAM;  Surgeon: Stark Klein, MD;  Location: [facility];  Service: General;  Laterality: N/A;  . COLONOSCOPY    . Cotton Osteotomy w/ Graft Left 06/18/2009  . Excision of Benign Lesion Right 01/30/2013   Rt Plantar  . FOOT SURGERY    . HAMMER TOE REPAIR Right 02/12/2016   RIGHT #5  . Hammertoe Repair Left 06/18/2009   Lt #5  . METATARSAL OSTEOTOMY Left 06/18/2009   #5  . Nail Matrixectomy Left 06/18/2009   LT #1  . OSTEOTOMY Right 01/30/2013   Rt #5  . Phalangectomy Left 06/18/2009   LT #1  . Phalangectomy Right 01/30/2013   Rt #1   OB History    Gravida Para Term Preterm AB Living   '4 3     1 3   ' SAB TAB Ectopic Multiple Live Births     1           Patient denies any cervical dysplasia or STIs. Prescriptions Prior to Admission  Medication Sig Dispense Refill Last Dose  . ARIPiprazole (ABILIFY) 10 MG tablet Take 10 mg by mouth daily.  0   . atorvastatin (LIPITOR) 10 MG tablet Take 10 mg by mouth daily.   Past Week at Unknown time  . Blood Glucose Monitoring Suppl (ACCU-CHEK AVIVA PLUS) W/DEVICE KIT 1 Device  by Does not apply route once. 1 kit 0 Taking  . buPROPion (WELLBUTRIN XL) 300 MG 24 hr tablet Take 300 mg by mouth every morning.  0 06/20/2017 at Unknown time  . glucose blood (ACCU-CHEK AVIVA) test strip 1 each by Other route 2 (two) times daily. And lancets 2/day 250.43 (Patient taking differently: 1 each by Other route 3 (three) times daily. And lancets 2/day 250.43) 100 each 12 Taking  . LEVEMIR FLEXTOUCH 100 UNIT/ML Pen Inject 40 Units into the skin at bedtime.   0 06/19/2017 at Unknown time  . lisinopril (PRINIVIL,ZESTRIL) 2.5 MG tablet Take 2.5 mg by mouth daily.  0 06/20/2017 at Unknown time  . metFORMIN (GLUCOPHAGE) 500 MG tablet Take 500 mg by mouth 3 (three) times daily with meals.   Past Week at Unknown time  . Multiple Vitamin (MULTIVITAMIN WITH MINERALS) TABS tablet Take 1 tablet by mouth daily. Centrum Silver     . NOVOLOG FLEXPEN 100 UNIT/ML FlexPen Inject 18 Units into the skin 3 (three) times daily as needed for high blood sugar.   0 06/19/2017  at Unknown time  . omeprazole (PRILOSEC) 40 MG capsule Take 40 mg by mouth daily before breakfast.   0 06/20/2017 at Unknown time  . prazosin (MINIPRESS) 5 MG capsule Take 10 mg by mouth at bedtime.  0 Past Week at Unknown time  . spironolactone (ALDACTONE) 50 MG tablet Take 50 mg by mouth daily.   06/19/2017 at Unknown time  . traZODone (DESYREL) 150 MG tablet Take 150 mg by mouth at bedtime.  0 06/19/2017 at Unknown time  . albuterol (PROVENTIL HFA;VENTOLIN HFA) 108 (90 BASE) MCG/ACT inhaler Inhale 2 puffs into the lungs every 6 (six) hours as needed. For shortness of breath.   More than a month at Unknown time  . HYDROcodone-acetaminophen (NORCO/VICODIN) 5-325 MG tablet Take 1 tablet by mouth every 4 (four) hours as needed. (Patient taking differently: Take 1 tablet by mouth every 6 (six) hours as needed (for pain.). ) 6 tablet 0 More than a month at Unknown time    No Known Allergies Social History:   reports that she quit smoking about 2  months ago. Her smoking use included Cigarettes. She smoked 0.50 packs per day. She has never used smokeless tobacco. She reports that she does not drink alcohol or use drugs. Family History  Problem Relation Age of Onset  . Heart disease Mother   . Diabetes Unknown        mat great aunt  . Cirrhosis Unknown        mat great aunt  . Cirrhosis Unknown        mat great uncles x 2  . Colon cancer Neg Hx   . Breast cancer Neg Hx     Review of Systems: Noncontributory  PHYSICAL EXAM: Blood pressure 112/77, pulse 87, temperature 97.9 F (36.6 C), temperature source Oral, resp. rate 16, SpO2 100 %. General appearance - alert, well appearing, and in no distress Chest - clear to auscultation, no wheezes, rales or rhonchi, symmetric air entry Heart - normal rate and regular rhythm Abdomen - soft, nontender, nondistended, no masses or organomegaly Pelvic - examination not indicated Extremities - peripheral pulses normal, no pedal edema, no clubbing or cyanosis  Labs: Results for orders placed or performed during the hospital encounter of 06/13/17 (from the past 336 hour(s))  CBC   Collection Time: 06/13/17 11:15 AM  Result Value Ref Range   WBC 5.2 4.0 - 10.5 K/uL   RBC 4.57 3.87 - 5.11 MIL/uL   Hemoglobin 11.7 (L) 12.0 - 15.0 g/dL   HCT 34.5 (L) 36.0 - 46.0 %   MCV 75.5 (L) 78.0 - 100.0 fL   MCH 25.6 (L) 26.0 - 34.0 pg   MCHC 33.9 30.0 - 36.0 g/dL   RDW 14.3 11.5 - 15.5 %   Platelets 101 (L) 150 - 400 K/uL  Comprehensive metabolic panel   Collection Time: 06/13/17 11:15 AM  Result Value Ref Range   Sodium 139 135 - 145 mmol/L   Potassium 4.2 3.5 - 5.1 mmol/L   Chloride 105 101 - 111 mmol/L   CO2 25 22 - 32 mmol/L   Glucose, Bld 191 (H) 65 - 99 mg/dL   BUN 12 6 - 20 mg/dL   Creatinine, Ser 0.90 0.44 - 1.00 mg/dL   Calcium 9.1 8.9 - 10.3 mg/dL   Total Protein 7.9 6.5 - 8.1 g/dL   Albumin 3.9 3.5 - 5.0 g/dL   AST 37 15 - 41 U/L   ALT 36 14 - 54 U/L   Alkaline  Phosphatase 112  38 - 126 U/L   Total Bilirubin 0.8 0.3 - 1.2 mg/dL   GFR calc non Af Amer >60 >60 mL/min   GFR calc Af Amer >60 >60 mL/min   Anion gap 9 5 - 15    Imaging Studies: No results found.  Assessment: Patient Active Problem List   Diagnosis Date Noted  . Chronic hepatitis C without hepatic coma ([place]) 12/09/2015  . Inclusion cyst 09/16/2014  . Onychomycosis 08/15/2014  . Type I (juvenile type) diabetes mellitus with renal manifestations, not stated as uncontrolled(250.41) 06/10/2014  . Unspecified constipation 05/22/2014  . Esophageal reflux 05/22/2014  . Pain in lower limb 01/29/2014  . Eschar of multiple sites 07/31/2013  . Porokeratosis 07/02/2013  . Cyst of joint of ankle or foot 06/04/2013  . Status post foot surgery 02/06/2013  . Pain in joint, ankle and foot 01/23/2013  . Callus of foot 01/23/2013  . Ingrown nail 01/23/2013  . Deformity of metatarsal 01/23/2013    Plan: Patient will undergo surgical management with  hysteroscopy with polypectomy.   The risks of surgery were discussed in detail with the patient including but not limited to: bleeding which may require transfusion or reoperation; infection which may require antibiotics; injury to surrounding organs which may involve bowel, bladder, ureters ; need for additional procedures including laparoscopy or laparotomy; thromboembolic phenomenon, surgical site problems and other postoperative/anesthesia complications. Likelihood of success in alleviating the patient's condition was discussed. Routine postoperative instructions will be reviewed with the patient and her family in detail after surgery.  The patient concurred with the proposed plan, giving informed written consent for the surgery.  Patient has been NPO since last night she will remain NPO for procedure.  Anesthesia and OR aware.  Preoperative prophylactic antibiotics and SCDs ordered on call to the OR.  To OR when ready.  Beanca Kiester L. Harraway-Smith, M.D.,  [facility] 06/20/2017 9:05 AM

## 2017-06-20 NOTE — Anesthesia Preprocedure Evaluation (Addendum)
Anesthesia Evaluation  Patient identified by MRN, date of birth, ID band Patient awake    Reviewed: Allergy & Precautions, NPO status , Patient's Chart, lab work & pertinent test results  Airway Mallampati: II  TM Distance: >3 FB Neck ROM: Full    Dental no notable dental hx. (+) Edentulous Upper, Upper Dentures   Pulmonary asthma , COPD, former smoker,    Pulmonary exam normal breath sounds clear to auscultation       Cardiovascular hypertension, Pt. on medications Normal cardiovascular exam Rhythm:Regular Rate:Normal     Neuro/Psych  Headaches, Anxiety Depression    GI/Hepatic GERD  ,(+)     substance abuse  alcohol use and cocaine use, Hepatitis -, C  Endo/Other  diabetes  Renal/GU negative Renal ROS     Musculoskeletal  (+) Arthritis ,   Abdominal   Peds  Hematology negative hematology ROS (+) anemia ,   Anesthesia Other Findings   Reproductive/Obstetrics negative OB ROS                            Anesthesia Physical  Anesthesia Plan  ASA: III  Anesthesia Plan: General   Post-op Pain Management:    Induction: Intravenous  PONV Risk Score and Plan: 3 and Ondansetron, Dexamethasone, Midazolam and Treatment may vary due to age or medical condition  Airway Management Planned: LMA  Additional Equipment: None  Intra-op Plan:   Post-operative Plan: Extubation in OR  Informed Consent: I have reviewed the patients History and Physical, chart, labs and discussed the procedure including the risks, benefits and alternatives for the proposed anesthesia with the patient or authorized representative who has indicated his/her understanding and acceptance.   Dental advisory given  Plan Discussed with: CRNA  Anesthesia Plan Comments:       Anesthesia Quick Evaluation

## 2017-06-21 ENCOUNTER — Encounter (HOSPITAL_COMMUNITY): Payer: Self-pay | Admitting: Obstetrics & Gynecology

## 2017-06-21 ENCOUNTER — Ambulatory Visit: Payer: Medicaid Other | Admitting: Obstetrics & Gynecology

## 2017-06-26 ENCOUNTER — Telehealth: Payer: Self-pay

## 2017-06-26 NOTE — Telephone Encounter (Signed)
Pt called and stated that she has started to have vaginal bleeding again and is c/o right shoulder/neck pain.

## 2017-06-28 NOTE — Telephone Encounter (Signed)
Called pt and pt informed me that her vaginal bleeding has gotten better. But she is still continuing to have some neck and shoulder pain.  Pt states that she is taking Aleve that is giving her some relief.  Pt has an appt scheduled for tomorrow.   I recommended that she use a heating pad along with Aleve until she is evaluated by the provider tomorrow.   I confirmed with pt that she is not having any heart issues.  Pt states "no".  Pt stated understanding to the recommendation.

## 2017-06-29 ENCOUNTER — Ambulatory Visit (INDEPENDENT_AMBULATORY_CARE_PROVIDER_SITE_OTHER): Payer: Medicaid Other | Admitting: Obstetrics & Gynecology

## 2017-06-29 ENCOUNTER — Encounter: Payer: Self-pay | Admitting: Obstetrics & Gynecology

## 2017-06-29 VITALS — BP 122/81 | HR 79 | Ht 60.0 in | Wt 197.0 lb

## 2017-06-29 DIAGNOSIS — N840 Polyp of corpus uteri: Secondary | ICD-10-CM

## 2017-06-29 DIAGNOSIS — Z9889 Other specified postprocedural states: Secondary | ICD-10-CM

## 2017-06-29 NOTE — Progress Notes (Signed)
History:  61 y.o. [id number] here today for 2 week post op check. Pt reports some pain in her right should since surgery but, she denies other problems.  The following portions of the patient's history were reviewed and updated as appropriate: allergies, current medications, past family history, past medical history, past social history, past surgical history and problem list.  Review of Systems:  Pertinent items are noted in HPI.   Objective:  Physical Exam Blood pressure 122/81, pulse 79, height 5' (1.524 m), weight 197 lb (89.4 kg). CONSTITUTIONAL: Well-developed, well-nourished female in no acute distress.  HENT:  Normocephalic, atraumatic EYES: Conjunctivae and EOM are normal. No scleral icterus.  NECK: Normal range of motion SKIN: Skin is warm and dry. No rash noted. Not diaphoretic.No pallor. NEUROLGIC: Alert and oriented to person, place, and time. Normal coordination.  Pelvic: deferred  Labs and Imaging 06/20/2017 Diagnosis Endometrium, curettage, includes polyp - BENIGN ENDOCERVICAL-TYPE POLYP - NO HYPERPLASIA OR MALIGNANCY IDENTIFIED  Assessment & Plan:  2 week post op check reviewed surg path with pt and daughter   F/u in 1 year or sooner prn  Lycia Sachdeva L. Harraway-Smith, M.D., [facility]

## 2017-06-29 NOTE — Progress Notes (Signed)
Patient declines seeing Asher Muir today- has someone she sees already at [facility]. Dr Erin Fulling aware of score

## 2017-06-30 ENCOUNTER — Encounter (HOSPITAL_COMMUNITY): Payer: Self-pay | Admitting: *Deleted

## 2017-06-30 NOTE — Progress Notes (Signed)
Pt denies SOB, chest pain, and being under the care of a cardiologist. Pt denies having a stress test, echo and cardiac cath. Pt denies having a chest x ray within the last year. Pt made aware to stop taking Aspirin,vitamins, fish oil and herbal medications. Do not take any NSAIDs ie: Ibuprofen, Advil, Naproxen (Aleve), Motrin, BC and Goody Powder or any medication containing Aspirin. Pt made aware to take half dose of Levemir the night before surgery (20 units instead of 40) and no Metformin the morning of procedure. Pt verbalized understanding of all pre-op instructions.

## 2017-07-02 NOTE — Anesthesia Preprocedure Evaluation (Addendum)
Anesthesia Evaluation  Patient identified by MRN, date of birth, ID band Patient awake    Reviewed: Allergy & Precautions, NPO status , Patient's Chart, lab work & pertinent test results  History of Anesthesia Complications Negative for: history of anesthetic complications  Airway Mallampati: II  TM Distance: >3 FB Neck ROM: Full    Dental  (+) Edentulous Upper, Dental Advidsory Given   Pulmonary asthma , COPD, former smoker,    breath sounds clear to auscultation       Cardiovascular hypertension,  Rhythm:Regular Rate:Normal     Neuro/Psych  Headaches, PSYCHIATRIC DISORDERS    GI/Hepatic GERD  Medicated and Controlled,(+) Hepatitis -, C  Endo/Other  diabetes, Poorly Controlled, Type 2  Renal/GU      Musculoskeletal  (+) Arthritis ,   Abdominal   Peds  Hematology   Anesthesia Other Findings   Reproductive/Obstetrics                           Anesthesia Physical Anesthesia Plan  ASA: III  Anesthesia Plan: General   Post-op Pain Management:    Induction: Intravenous  PONV Risk Score and Plan: 4 or greater and Ondansetron, Dexamethasone, Midazolam, Scopolamine patch - Pre-op and Treatment may vary due to age or medical condition  Airway Management Planned: Oral ETT  Additional Equipment:   Intra-op Plan:   Post-operative Plan: Extubation in OR  Informed Consent: I have reviewed the patients History and Physical, chart, labs and discussed the procedure including the risks, benefits and alternatives for the proposed anesthesia with the patient or authorized representative who has indicated his/her understanding and acceptance.   Dental advisory given and Dental Advisory Given  Plan Discussed with: CRNA and Anesthesiologist  Anesthesia Plan Comments:        Anesthesia Quick Evaluation

## 2017-07-03 ENCOUNTER — Encounter (HOSPITAL_COMMUNITY): Payer: Self-pay

## 2017-07-03 ENCOUNTER — Inpatient Hospital Stay (HOSPITAL_COMMUNITY)
Admission: RE | Admit: 2017-07-03 | Discharge: 2017-07-04 | DRG: 455 | Disposition: A | Discharge status: Home / Self Care | Payer: Medicaid Other | Source: Ambulatory Visit | Attending: Neurosurgery | Admitting: Neurosurgery

## 2017-07-03 ENCOUNTER — Encounter (HOSPITAL_COMMUNITY)
Admission: RE | Disposition: A | Discharge status: Home / Self Care | Payer: Self-pay | Source: Ambulatory Visit | Attending: Neurosurgery

## 2017-07-03 ENCOUNTER — Inpatient Hospital Stay (HOSPITAL_COMMUNITY): Payer: Medicaid Other

## 2017-07-03 ENCOUNTER — Inpatient Hospital Stay (HOSPITAL_COMMUNITY): Payer: Medicaid Other | Admitting: Anesthesiology

## 2017-07-03 DIAGNOSIS — K219 Gastro-esophageal reflux disease without esophagitis: Secondary | ICD-10-CM | POA: Diagnosis present

## 2017-07-03 DIAGNOSIS — M4316 Spondylolisthesis, lumbar region: Secondary | ICD-10-CM | POA: Diagnosis present

## 2017-07-03 DIAGNOSIS — Z794 Long term (current) use of insulin: Secondary | ICD-10-CM

## 2017-07-03 DIAGNOSIS — I10 Essential (primary) hypertension: Secondary | ICD-10-CM | POA: Diagnosis present

## 2017-07-03 DIAGNOSIS — J449 Chronic obstructive pulmonary disease, unspecified: Secondary | ICD-10-CM | POA: Diagnosis present

## 2017-07-03 DIAGNOSIS — M5416 Radiculopathy, lumbar region: Secondary | ICD-10-CM | POA: Diagnosis present

## 2017-07-03 DIAGNOSIS — Z8249 Family history of ischemic heart disease and other diseases of the circulatory system: Secondary | ICD-10-CM

## 2017-07-03 DIAGNOSIS — M48061 Spinal stenosis, lumbar region without neurogenic claudication: Secondary | ICD-10-CM | POA: Diagnosis present

## 2017-07-03 DIAGNOSIS — E119 Type 2 diabetes mellitus without complications: Secondary | ICD-10-CM | POA: Diagnosis present

## 2017-07-03 DIAGNOSIS — Z87891 Personal history of nicotine dependence: Secondary | ICD-10-CM

## 2017-07-03 DIAGNOSIS — E785 Hyperlipidemia, unspecified: Secondary | ICD-10-CM | POA: Diagnosis present

## 2017-07-03 DIAGNOSIS — M5126 Other intervertebral disc displacement, lumbar region: Secondary | ICD-10-CM | POA: Diagnosis present

## 2017-07-03 DIAGNOSIS — M4310 Spondylolisthesis, site unspecified: Secondary | ICD-10-CM | POA: Diagnosis present

## 2017-07-03 DIAGNOSIS — Z419 Encounter for procedure for purposes other than remedying health state, unspecified: Secondary | ICD-10-CM

## 2017-07-03 DIAGNOSIS — M545 Low back pain: Secondary | ICD-10-CM | POA: Diagnosis present

## 2017-07-03 HISTORY — DX: Presence of spectacles and contact lenses: Z97.3

## 2017-07-03 HISTORY — DX: Presence of dental prosthetic device (complete) (partial): Z97.2

## 2017-07-03 HISTORY — DX: Polyp of corpus uteri: N84.0

## 2017-07-03 HISTORY — DX: Spondylolisthesis, lumbar region: M43.16

## 2017-07-03 LAB — TYPE AND SCREEN
ABO/RH(D): A POS
Antibody Screen: NEGATIVE

## 2017-07-03 LAB — COMPREHENSIVE METABOLIC PANEL
ALBUMIN: 3.5 g/dL (ref 3.5–5.0)
ALK PHOS: 102 U/L (ref 38–126)
ALT: 31 U/L (ref 14–54)
AST: 28 U/L (ref 15–41)
Anion gap: 7 (ref 5–15)
BILIRUBIN TOTAL: 0.2 mg/dL — AB (ref 0.3–1.2)
BUN: 16 mg/dL (ref 6–20)
CALCIUM: 9 mg/dL (ref 8.9–10.3)
CO2: 22 mmol/L (ref 22–32)
Chloride: 108 mmol/L (ref 101–111)
Creatinine, Ser: 0.85 mg/dL (ref 0.44–1.00)
GFR calc Af Amer: >60 mL/min (ref >60)
GFR calc non Af Amer: >60 mL/min (ref >60)
GLUCOSE: 91 mg/dL (ref 65–99)
Potassium: 4 mmol/L (ref 3.5–5.1)
Sodium: 137 mmol/L (ref 135–145)
TOTAL PROTEIN: 7.1 g/dL (ref 6.5–8.1)

## 2017-07-03 LAB — CBC WITH DIFFERENTIAL/PLATELET
BASOS ABS: 0 10*3/uL (ref 0.0–0.1)
BASOS PCT: 0 %
EOS ABS: 0.3 10*3/uL (ref 0.0–0.7)
EOS PCT: 6 %
HCT: 33.6 % — ABNORMAL LOW (ref 36.0–46.0)
Hemoglobin: 11.1 g/dL — ABNORMAL LOW (ref 12.0–15.0)
LYMPHS PCT: 44 %
Lymphs Abs: 2.3 10*3/uL (ref 0.7–4.0)
MCH: 25.1 pg — ABNORMAL LOW (ref 26.0–34.0)
MCHC: 33 g/dL (ref 30.0–36.0)
MCV: 75.8 fL — ABNORMAL LOW (ref 78.0–100.0)
MONO ABS: 0.3 10*3/uL (ref 0.1–1.0)
Monocytes Relative: 6 %
NEUTROS PCT: 44 %
Neutro Abs: 2.4 10*3/uL (ref 1.7–7.7)
Platelets: 85 10*3/uL — ABNORMAL LOW (ref 150–400)
RBC: 4.43 MIL/uL (ref 3.87–5.11)
RDW: 13.5 % (ref 11.5–15.5)
WBC: 5.3 10*3/uL (ref 4.0–10.5)

## 2017-07-03 LAB — GLUCOSE, CAPILLARY
Glucose-Capillary: 93 mg/dL (ref 65–99)
Glucose-Capillary: 136 mg/dL — ABNORMAL HIGH (ref 65–99)
Glucose-Capillary: 211 mg/dL — ABNORMAL HIGH (ref 65–99)
Glucose-Capillary: 343 mg/dL — ABNORMAL HIGH (ref 65–99)

## 2017-07-03 LAB — HEMOGLOBIN A1C
Hgb A1c MFr Bld: 7.1 % — ABNORMAL HIGH (ref 4.8–5.6)
Mean Plasma Glucose: 157.07 mg/dL

## 2017-07-03 LAB — ABO/RH: ABO/RH(D): A POS

## 2017-07-03 LAB — MRSA PCR SCREENING: MRSA BY PCR: NEGATIVE

## 2017-07-03 SURGERY — POSTERIOR LUMBAR FUSION 1 LEVEL
Anesthesia: General | Site: Back

## 2017-07-03 MED ORDER — PHENYLEPHRINE HCL 10 MG/ML IJ SOLN
INTRAMUSCULAR | Status: DC | PRN
  Administered 2017-07-03 (×2): 40 ug via INTRAVENOUS

## 2017-07-03 MED ORDER — TRAZODONE HCL 150 MG PO TABS
150.0000 mg | ORAL_TABLET | Freq: Every day | ORAL | Status: DC
  Administered 2017-07-03: 150 mg via ORAL
  Filled 2017-07-03: qty 1

## 2017-07-03 MED ORDER — SODIUM CHLORIDE 0.9% FLUSH: 3.0000 mL | INTRAVENOUS | Status: DC | PRN

## 2017-07-03 MED ORDER — LIDOCAINE HCL (CARDIAC) 20 MG/ML IV SOLN
INTRAVENOUS | Status: DC | PRN
  Administered 2017-07-03: 80 mg via INTRAVENOUS

## 2017-07-03 MED ORDER — SODIUM CHLORIDE 0.9% FLUSH
3.0000 mL | Freq: Two times a day (BID) | INTRAVENOUS | Status: DC
  Administered 2017-07-03: 3 mL via INTRAVENOUS

## 2017-07-03 MED ORDER — LISINOPRIL 2.5 MG PO TABS
2.5000 mg | ORAL_TABLET | Freq: Every day | ORAL | Status: DC
  Administered 2017-07-03: 2.5 mg via ORAL
  Filled 2017-07-03 (×2): qty 1

## 2017-07-03 MED ORDER — CHLORHEXIDINE GLUCONATE CLOTH 2 % EX PADS: 6.0000 | MEDICATED_PAD | Freq: Once | CUTANEOUS | Status: DC

## 2017-07-03 MED ORDER — SODIUM CHLORIDE 0.9 % IV SOLN: 250.0000 mL | INTRAVENOUS | Status: DC

## 2017-07-03 MED ORDER — DIAZEPAM 5 MG PO TABS
5.0000 mg | ORAL_TABLET | Freq: Four times a day (QID) | ORAL | Status: DC | PRN
  Administered 2017-07-03 (×2): 5 mg via ORAL
  Administered 2017-07-03: 10 mg via ORAL
  Filled 2017-07-03: qty 1
  Filled 2017-07-03: qty 2

## 2017-07-03 MED ORDER — ONDANSETRON HCL 4 MG/2ML IJ SOLN: 4.0000 mg | Freq: Four times a day (QID) | INTRAMUSCULAR | Status: DC | PRN

## 2017-07-03 MED ORDER — PHENOL 1.4 % MT LIQD: 1.0000 | OROMUCOSAL | Status: DC | PRN

## 2017-07-03 MED ORDER — ALBUTEROL SULFATE (2.5 MG/3ML) 0.083% IN NEBU: 3.0000 mL | INHALATION_SOLUTION | Freq: Four times a day (QID) | RESPIRATORY_TRACT | Status: DC | PRN

## 2017-07-03 MED ORDER — PANTOPRAZOLE SODIUM 40 MG PO TBEC
40.0000 mg | DELAYED_RELEASE_TABLET | Freq: Every day | ORAL | Status: DC
  Administered 2017-07-04: 40 mg via ORAL
  Filled 2017-07-03: qty 1

## 2017-07-03 MED ORDER — HYDROMORPHONE HCL 1 MG/ML IJ SOLN: 0.5000 mg | INTRAMUSCULAR | Status: DC | PRN

## 2017-07-03 MED ORDER — INSULIN ASPART 100 UNIT/ML ~~LOC~~ SOLN
0.0000 [IU] | Freq: Every day | SUBCUTANEOUS | Status: DC
  Administered 2017-07-03: 4 [IU] via SUBCUTANEOUS

## 2017-07-03 MED ORDER — VANCOMYCIN HCL 1000 MG IV SOLR
INTRAVENOUS | Status: DC | PRN
  Administered 2017-07-03: 1000 mg via TOPICAL

## 2017-07-03 MED ORDER — ONDANSETRON HCL 4 MG PO TABS: 4.0000 mg | ORAL_TABLET | Freq: Four times a day (QID) | ORAL | Status: DC | PRN

## 2017-07-03 MED ORDER — INSULIN ASPART 100 UNIT/ML ~~LOC~~ SOLN
0.0000 [IU] | Freq: Three times a day (TID) | SUBCUTANEOUS | Status: DC
  Administered 2017-07-04: 4 [IU] via SUBCUTANEOUS

## 2017-07-03 MED ORDER — MENTHOL 3 MG MT LOZG: 1.0000 | LOZENGE | OROMUCOSAL | Status: DC | PRN

## 2017-07-03 MED ORDER — PROPOFOL 10 MG/ML IV BOLUS
INTRAVENOUS | Status: AC
Start: 2017-07-03 — End: 2017-07-03
  Filled 2017-07-03: qty 20

## 2017-07-03 MED ORDER — PROPOFOL 10 MG/ML IV BOLUS
INTRAVENOUS | Status: DC | PRN
Start: 2017-07-03 — End: 2017-07-03
  Administered 2017-07-03: 150 mg via INTRAVENOUS

## 2017-07-03 MED ORDER — SURGIFOAM 100 EX MISC
CUTANEOUS | Status: DC | PRN
  Administered 2017-07-03: 09:00:00 via TOPICAL

## 2017-07-03 MED ORDER — OXYCODONE HCL 5 MG PO TABS
ORAL_TABLET | ORAL | Status: AC
  Filled 2017-07-03: qty 1

## 2017-07-03 MED ORDER — ONDANSETRON HCL 4 MG/2ML IJ SOLN
INTRAMUSCULAR | Status: DC | PRN
  Administered 2017-07-03: 4 mg via INTRAVENOUS

## 2017-07-03 MED ORDER — SCOPOLAMINE 1 MG/3DAYS TD PT72
MEDICATED_PATCH | TRANSDERMAL | Status: DC | PRN
  Administered 2017-07-03: 1 via TRANSDERMAL

## 2017-07-03 MED ORDER — THROMBIN 5000 UNITS EX SOLR
CUTANEOUS | Status: AC
  Filled 2017-07-03: qty 5000

## 2017-07-03 MED ORDER — THROMBIN 20000 UNITS EX SOLR
CUTANEOUS | Status: AC
  Filled 2017-07-03: qty 20000

## 2017-07-03 MED ORDER — ADULT MULTIVITAMIN W/MINERALS CH
1.0000 | ORAL_TABLET | Freq: Every day | ORAL | Status: DC
  Administered 2017-07-03 – 2017-07-04 (×2): 1 via ORAL
  Filled 2017-07-03 (×2): qty 1

## 2017-07-03 MED ORDER — HYDROMORPHONE HCL 1 MG/ML IJ SOLN
0.2500 mg | INTRAMUSCULAR | Status: DC | PRN
  Administered 2017-07-03 (×4): 0.5 mg via INTRAVENOUS

## 2017-07-03 MED ORDER — METFORMIN HCL 500 MG PO TABS
500.0000 mg | ORAL_TABLET | Freq: Three times a day (TID) | ORAL | Status: DC
  Administered 2017-07-03 – 2017-07-04 (×2): 500 mg via ORAL
  Filled 2017-07-03 (×2): qty 1

## 2017-07-03 MED ORDER — PHENYLEPHRINE HCL 10 MG/ML IJ SOLN
INTRAVENOUS | Status: DC | PRN
  Administered 2017-07-03: 25 ug/min via INTRAVENOUS

## 2017-07-03 MED ORDER — BUPROPION HCL ER (XL) 300 MG PO TB24
300.0000 mg | ORAL_TABLET | Freq: Every morning | ORAL | Status: DC
  Administered 2017-07-04: 300 mg via ORAL
  Filled 2017-07-03: qty 1

## 2017-07-03 MED ORDER — MIDAZOLAM HCL 2 MG/2ML IJ SOLN
INTRAMUSCULAR | Status: AC
  Filled 2017-07-03: qty 2

## 2017-07-03 MED ORDER — INSULIN ASPART 100 UNIT/ML ~~LOC~~ SOLN: 0.0000 [IU] | Freq: Three times a day (TID) | SUBCUTANEOUS | Status: DC

## 2017-07-03 MED ORDER — VANCOMYCIN HCL 1000 MG IV SOLR
INTRAVENOUS | Status: AC
  Filled 2017-07-03: qty 1000

## 2017-07-03 MED ORDER — INSULIN DETEMIR 100 UNIT/ML ~~LOC~~ SOLN
40.0000 [IU] | Freq: Every day | SUBCUTANEOUS | Status: DC
  Administered 2017-07-03: 40 [IU] via SUBCUTANEOUS
  Filled 2017-07-03: qty 0.4

## 2017-07-03 MED ORDER — FENTANYL CITRATE (PF) 250 MCG/5ML IJ SOLN
INTRAMUSCULAR | Status: AC
Start: 2017-07-03 — End: 2017-07-03
  Filled 2017-07-03: qty 5

## 2017-07-03 MED ORDER — FENTANYL CITRATE (PF) 100 MCG/2ML IJ SOLN
INTRAMUSCULAR | Status: DC | PRN
  Administered 2017-07-03 (×4): 50 ug via INTRAVENOUS

## 2017-07-03 MED ORDER — HYDROMORPHONE HCL 1 MG/ML IJ SOLN
INTRAMUSCULAR | Status: AC
  Filled 2017-07-03: qty 1

## 2017-07-03 MED ORDER — 0.9 % SODIUM CHLORIDE (POUR BTL) OPTIME
TOPICAL | Status: DC | PRN
  Administered 2017-07-03: 1000 mL

## 2017-07-03 MED ORDER — ATORVASTATIN CALCIUM 20 MG PO TABS
10.0000 mg | ORAL_TABLET | Freq: Every day | ORAL | Status: DC
  Administered 2017-07-03 – 2017-07-04 (×2): 10 mg via ORAL
  Filled 2017-07-03 (×2): qty 1

## 2017-07-03 MED ORDER — BUPIVACAINE HCL (PF) 0.25 % IJ SOLN
INTRAMUSCULAR | Status: AC
Start: 2017-07-03 — End: 2017-07-03
  Filled 2017-07-03: qty 30

## 2017-07-03 MED ORDER — BUPIVACAINE HCL (PF) 0.25 % IJ SOLN
INTRAMUSCULAR | Status: DC | PRN
  Administered 2017-07-03: 20 mL

## 2017-07-03 MED ORDER — SODIUM CHLORIDE 0.9 % IR SOLN
Status: DC | PRN
  Administered 2017-07-03: 09:00:00

## 2017-07-03 MED ORDER — CEFAZOLIN SODIUM-DEXTROSE 2-4 GM/100ML-% IV SOLN
2.0000 g | INTRAVENOUS | Status: AC
  Administered 2017-07-03: 2 g via INTRAVENOUS
  Filled 2017-07-03: qty 100

## 2017-07-03 MED ORDER — OXYCODONE HCL 5 MG PO TABS
5.0000 mg | ORAL_TABLET | Freq: Once | ORAL | Status: AC | PRN
  Administered 2017-07-03: 5 mg via ORAL

## 2017-07-03 MED ORDER — CEFAZOLIN SODIUM-DEXTROSE 1-4 GM/50ML-% IV SOLN
1.0000 g | Freq: Three times a day (TID) | INTRAVENOUS | Status: AC
  Administered 2017-07-03 (×2): 1 g via INTRAVENOUS
  Filled 2017-07-03 (×2): qty 50

## 2017-07-03 MED ORDER — DIAZEPAM 5 MG PO TABS
ORAL_TABLET | ORAL | Status: AC
  Filled 2017-07-03: qty 1

## 2017-07-03 MED ORDER — SPIRONOLACTONE 50 MG PO TABS
50.0000 mg | ORAL_TABLET | Freq: Every day | ORAL | Status: DC
  Administered 2017-07-04: 50 mg via ORAL
  Filled 2017-07-03: qty 1

## 2017-07-03 MED ORDER — INSULIN ASPART 100 UNIT/ML FLEXPEN
18.0000 [IU] | PEN_INJECTOR | Freq: Three times a day (TID) | SUBCUTANEOUS | Status: DC | PRN
Start: 2017-07-03 — End: 2017-07-03

## 2017-07-03 MED ORDER — DEXAMETHASONE SODIUM PHOSPHATE 10 MG/ML IJ SOLN
10.0000 mg | INTRAMUSCULAR | Status: AC
  Administered 2017-07-03: 10 mg via INTRAVENOUS
  Filled 2017-07-03: qty 1

## 2017-07-03 MED ORDER — HYDROCODONE-ACETAMINOPHEN 10-325 MG PO TABS
1.0000 | ORAL_TABLET | ORAL | Status: DC | PRN
  Administered 2017-07-03 – 2017-07-04 (×5): 2 via ORAL
  Filled 2017-07-03 (×5): qty 2

## 2017-07-03 MED ORDER — OXYCODONE HCL 5 MG/5ML PO SOLN
5.0000 mg | Freq: Once | ORAL | Status: AC | PRN
Start: 2017-07-03 — End: 2017-07-03

## 2017-07-03 MED ORDER — SUGAMMADEX SODIUM 200 MG/2ML IV SOLN
INTRAVENOUS | Status: DC | PRN
  Administered 2017-07-03: 300 mg via INTRAVENOUS

## 2017-07-03 MED ORDER — INSULIN ASPART 100 UNIT/ML ~~LOC~~ SOLN
0.0000 [IU] | Freq: Three times a day (TID) | SUBCUTANEOUS | Status: DC
Start: 2017-07-03 — End: 2017-07-03
  Administered 2017-07-03: 7 [IU] via SUBCUTANEOUS
  Administered 2017-07-03: 4 [IU] via SUBCUTANEOUS

## 2017-07-03 MED ORDER — PRAZOSIN HCL 2 MG PO CAPS
10.0000 mg | ORAL_CAPSULE | Freq: Every day | ORAL | Status: DC
  Filled 2017-07-03: qty 5

## 2017-07-03 MED ORDER — MIDAZOLAM HCL 5 MG/5ML IJ SOLN
INTRAMUSCULAR | Status: DC | PRN
  Administered 2017-07-03: 2 mg via INTRAVENOUS

## 2017-07-03 MED ORDER — ROCURONIUM BROMIDE 100 MG/10ML IV SOLN
INTRAVENOUS | Status: DC | PRN
  Administered 2017-07-03 (×2): 10 mg via INTRAVENOUS
  Administered 2017-07-03: 50 mg via INTRAVENOUS

## 2017-07-03 MED ORDER — SCOPOLAMINE 1 MG/3DAYS TD PT72
MEDICATED_PATCH | TRANSDERMAL | Status: AC
  Filled 2017-07-03: qty 1

## 2017-07-03 MED ORDER — ARTIFICIAL TEARS OPHTHALMIC OINT
TOPICAL_OINTMENT | OPHTHALMIC | Status: DC | PRN
  Administered 2017-07-03: 1 via OPHTHALMIC

## 2017-07-03 MED ORDER — LACTATED RINGERS IV SOLN
INTRAVENOUS | Status: DC
  Administered 2017-07-03 (×3): via INTRAVENOUS

## 2017-07-03 SURGICAL SUPPLY — 70 items
ADH SKN CLS APL DERMABOND .7 (GAUZE/BANDAGES/DRESSINGS) ×1
APL SKNCLS STERI-STRIP NONHPOA (GAUZE/BANDAGES/DRESSINGS) ×1
BAG DECANTER FOR FLEXI CONT (MISCELLANEOUS) ×2 IMPLANT
BENZOIN TINCTURE PRP APPL 2/3 (GAUZE/BANDAGES/DRESSINGS) ×2 IMPLANT
BLADE CLIPPER SURG (BLADE) IMPLANT
BUR CUTTER 7.0 ROUND (BURR) IMPLANT
BUR MATCHSTICK NEURO 3.0 LAGG (BURR) ×2 IMPLANT
CANISTER SUCT 3000ML PPV (MISCELLANEOUS) ×2 IMPLANT
CAP LCK SPNE (Orthopedic Implant) ×4 IMPLANT
CAP LOCK SPINE RADIUS (Orthopedic Implant) IMPLANT
CAP LOCKING (Orthopedic Implant) ×8 IMPLANT
CARTRIDGE OIL MAESTRO DRILL (MISCELLANEOUS) ×1 IMPLANT
CONT SPEC 4OZ CLIKSEAL STRL BL (MISCELLANEOUS) ×2 IMPLANT
COVER BACK TABLE 60X90IN (DRAPES) ×2 IMPLANT
DECANTER SPIKE VIAL GLASS SM (MISCELLANEOUS) ×2 IMPLANT
DERMABOND ADVANCED (GAUZE/BANDAGES/DRESSINGS) ×1
DERMABOND ADVANCED .7 DNX12 (GAUZE/BANDAGES/DRESSINGS) ×1 IMPLANT
DEVICE INTERBODY ELEVATE 23X8 (Cage) ×4 IMPLANT
DIFFUSER DRILL AIR PNEUMATIC (MISCELLANEOUS) ×2 IMPLANT
DRAPE C-ARM 42X72 X-RAY (DRAPES) ×4 IMPLANT
DRAPE HALF SHEET 40X57 (DRAPES) ×1 IMPLANT
DRAPE LAPAROTOMY 100X72X124 (DRAPES) ×2 IMPLANT
DRAPE POUCH INSTRU U-SHP 10X18 (DRAPES) ×2 IMPLANT
DRAPE SURG 17X23 STRL (DRAPES) ×8 IMPLANT
DRSG OPSITE POSTOP 4X6 (GAUZE/BANDAGES/DRESSINGS) ×1 IMPLANT
DURAPREP 26ML APPLICATOR (WOUND CARE) ×2 IMPLANT
ELECT REM PT RETURN 9FT ADLT (ELECTROSURGICAL) ×2
ELECTRODE REM PT RTRN 9FT ADLT (ELECTROSURGICAL) ×1 IMPLANT
EVACUATOR 1/8 PVC DRAIN (DRAIN) ×1 IMPLANT
GAUZE SPONGE 4X4 12PLY STRL (GAUZE/BANDAGES/DRESSINGS) IMPLANT
GAUZE SPONGE 4X4 16PLY XRAY LF (GAUZE/BANDAGES/DRESSINGS) IMPLANT
GLOVE BIO SURGEON STRL SZ8 (GLOVE) ×1 IMPLANT
GLOVE BIO SURGEON STRL SZ8.5 (GLOVE) ×1 IMPLANT
GLOVE BIOGEL PI IND STRL 6.5 (GLOVE) IMPLANT
GLOVE BIOGEL PI IND STRL 7.5 (GLOVE) IMPLANT
GLOVE BIOGEL PI INDICATOR 6.5 (GLOVE) ×2
GLOVE BIOGEL PI INDICATOR 7.5 (GLOVE) ×1
GLOVE ECLIPSE 9.0 STRL (GLOVE) ×5 IMPLANT
GLOVE EXAM NITRILE LRG STRL (GLOVE) IMPLANT
GLOVE EXAM NITRILE XL STR (GLOVE) IMPLANT
GLOVE EXAM NITRILE XS STR PU (GLOVE) IMPLANT
GLOVE SURG SS PI 6.5 STRL IVOR (GLOVE) ×4 IMPLANT
GOWN STRL REUS W/ TWL LRG LVL3 (GOWN DISPOSABLE) IMPLANT
GOWN STRL REUS W/ TWL XL LVL3 (GOWN DISPOSABLE) ×2 IMPLANT
GOWN STRL REUS W/TWL 2XL LVL3 (GOWN DISPOSABLE) IMPLANT
GOWN STRL REUS W/TWL LRG LVL3 (GOWN DISPOSABLE) ×4
GOWN STRL REUS W/TWL XL LVL3 (GOWN DISPOSABLE) ×6
KIT BASIN OR (CUSTOM PROCEDURE TRAY) ×2 IMPLANT
KIT ROOM TURNOVER OR (KITS) ×2 IMPLANT
MILL MEDIUM DISP (BLADE) ×2 IMPLANT
NEEDLE HYPO 22GX1.5 SAFETY (NEEDLE) ×2 IMPLANT
NS IRRIG 1000ML POUR BTL (IV SOLUTION) ×2 IMPLANT
OIL CARTRIDGE MAESTRO DRILL (MISCELLANEOUS) ×2
PACK LAMINECTOMY NEURO (CUSTOM PROCEDURE TRAY) ×2 IMPLANT
ROD RADIUS 40MM (Neuro Prosthesis/Implant) ×4 IMPLANT
ROD SPNL 40X5.5XNS TI RDS (Neuro Prosthesis/Implant) IMPLANT
SCREW 5.75X40M (Screw) ×2 IMPLANT
SCREW 5.75X45MM (Screw) ×2 IMPLANT
SPACER SPNL STD 23X8XSTRL (Cage) IMPLANT
SPCR SPNL STD 23X8XSTRL (Cage) ×2 IMPLANT
SPONGE SURGIFOAM ABS GEL 100 (HEMOSTASIS) ×2 IMPLANT
STRIP CLOSURE SKIN 1/2X4 (GAUZE/BANDAGES/DRESSINGS) ×3 IMPLANT
SUT VIC AB 0 CT1 18XCR BRD8 (SUTURE) ×2 IMPLANT
SUT VIC AB 0 CT1 8-18 (SUTURE) ×2
SUT VIC AB 2-0 CT1 18 (SUTURE) ×2 IMPLANT
SUT VIC AB 3-0 SH 8-18 (SUTURE) ×4 IMPLANT
TOWEL GREEN STERILE (TOWEL DISPOSABLE) ×2 IMPLANT
TOWEL GREEN STERILE FF (TOWEL DISPOSABLE) ×2 IMPLANT
TRAY FOLEY W/METER SILVER 16FR (SET/KITS/TRAYS/PACK) ×2 IMPLANT
WATER STERILE IRR 1000ML POUR (IV SOLUTION) ×2 IMPLANT

## 2017-07-03 NOTE — H&P (Signed)
Kara Kelley is an 61 y.o. female.   Chief Complaint: Back pain HPI: 61 year old female with severe back pain with radiation to her right lower extremity failing conservative management. Workup demonstrates evidence of a grade 1 L4-5 degenerative spondylolisthesis with associated right word disc herniation causing severe stenosis. Patient's failed conservative management and presents now for decompression and fusion in hopes of improving her symptoms.  Past Medical History:  Diagnosis Date  . Anemia   . Anxiety   . Arthritis   . Asthma   . Bunion   . Callus   . Chronic pain   . Cocaine abuse   . COPD (chronic obstructive pulmonary disease) ([place])   . Corns and callosities   . Degenerative joint disease   . Depression   . Diabetes mellitus   . Endometrial polyp   . ETOH abuse   . Gall stones   . GERD (gastroesophageal reflux disease)   . Headache    history of Migraines  . Hepatitis C    Hep C  . Hyperlipidemia   . Hypertension   . Insomnia   . Spondylolisthesis of lumbar region   . Substance abuse    alcoholism  . Tuberculosis 1985  . Wears dentures   . Wears glasses     Past Surgical History:  Procedure Laterality Date  . CESAREAN SECTION     x3  . CHOLECYSTECTOMY N/A 08/11/2016   Procedure: LAPAROSCOPIC CHOLECYSTECTOMY WITH   INTRAOPERATIVE CHOLANGIOGRAM;  Surgeon: Stark Klein, MD;  Location: [facility];  Service: General;  Laterality: N/A;  . COLONOSCOPY    . Cotton Osteotomy w/ Graft Left 06/18/2009  . Excision of Benign Lesion Right 01/30/2013   Rt Plantar  . FOOT SURGERY    . HAMMER TOE REPAIR Right 02/12/2016   RIGHT #5  . Hammertoe Repair Left 06/18/2009   Lt #5  . HYSTEROSCOPY N/A 06/20/2017   Procedure: DILATION AND CURETTAGE, HYSTEROSCOPY w/ Polypectomy;  Surgeon: Lavonia Drafts, MD;  Location: [facility];  Service: Gynecology;  Laterality: N/A;  . METATARSAL OSTEOTOMY Left 06/18/2009   #5  . MULTIPLE TOOTH EXTRACTIONS    . Nail Matrixectomy Left  06/18/2009   LT #1  . OSTEOTOMY Right 01/30/2013   Rt #5  . Phalangectomy Left 06/18/2009   LT #1  . Phalangectomy Right 01/30/2013   Rt #1  . TUBAL LIGATION      Family History  Problem Relation Age of Onset  . Heart disease Mother   . Diabetes Unknown        mat great aunt  . Cirrhosis Unknown        mat great aunt  . Cirrhosis Unknown        mat great uncles x 2  . Colon cancer Neg Hx   . Breast cancer Neg Hx    Social History:  reports that she quit smoking about 2 months ago. Her smoking use included Cigarettes. She smoked 0.50 packs per day. She has never used smokeless tobacco. She reports that she does not drink alcohol or use drugs.  Allergies: No Known Allergies  Medications Prior to Admission  Medication Sig Dispense Refill  . albuterol (PROVENTIL HFA;VENTOLIN HFA) 108 (90 BASE) MCG/ACT inhaler Inhale 2 puffs into the lungs every 6 (six) hours as needed. For shortness of breath.    [name] atorvastatin (LIPITOR) 10 MG tablet Take 10 mg by mouth daily.    . Blood Glucose Monitoring Suppl (ACCU-CHEK AVIVA PLUS) W/DEVICE KIT 1 Device by Does  not apply route once. 1 kit 0  . buPROPion (WELLBUTRIN XL) 300 MG 24 hr tablet Take 300 mg by mouth every morning.  0  . glucose blood (ACCU-CHEK AVIVA) test strip 1 each by Other route 2 (two) times daily. And lancets 2/day 250.43 (Patient taking differently: 1 each by Other route 3 (three) times daily. And lancets 2/day 250.43) 100 each 12  . HYDROcodone-acetaminophen (NORCO/VICODIN) 5-325 MG tablet Take 1 tablet by mouth every 4 (four) hours as needed. (Patient taking differently: Take 1 tablet by mouth every 6 (six) hours as needed (for pain.). ) 6 tablet 0  . LEVEMIR FLEXTOUCH 100 UNIT/ML Pen Inject 40 Units into the skin at bedtime.   0  . lisinopril (PRINIVIL,ZESTRIL) 2.5 MG tablet Take 2.5 mg by mouth daily.  0  . metFORMIN (GLUCOPHAGE) 500 MG tablet Take 500 mg by mouth 3 (three) times daily with meals.    . Multiple Vitamin  (MULTIVITAMIN WITH MINERALS) TABS tablet Take 1 tablet by mouth daily. Centrum Silver    . NOVOLOG FLEXPEN 100 UNIT/ML FlexPen Inject 18 Units into the skin 3 (three) times daily as needed for high blood sugar.   0  . omeprazole (PRILOSEC) 40 MG capsule Take 40 mg by mouth daily before breakfast.   0  . prazosin (MINIPRESS) 5 MG capsule Take 10 mg by mouth at bedtime.  0  . spironolactone (ALDACTONE) 50 MG tablet Take 50 mg by mouth daily.    . traZODone (DESYREL) 150 MG tablet Take 150 mg by mouth at bedtime.  0    Results for orders placed or performed during the hospital encounter of 07/03/17 (from the past 48 hour(s))  Glucose, capillary     Status: None   Collection Time: 07/03/17  6:36 AM  Result Value Ref Range   Glucose-Capillary 93 65 - 99 mg/dL  Type and screen     Status: None   Collection Time: 07/03/17  6:45 AM  Result Value Ref Range   ABO/RH(D) A POS    Antibody Screen NEG    Sample Expiration 07/06/2017   Comprehensive metabolic panel     Status: Abnormal   Collection Time: 07/03/17  6:46 AM  Result Value Ref Range   Sodium 137 135 - 145 mmol/L   Potassium 4.0 3.5 - 5.1 mmol/L   Chloride 108 101 - 111 mmol/L   CO2 22 22 - 32 mmol/L   Glucose, Bld 91 65 - 99 mg/dL   BUN 16 6 - 20 mg/dL   Creatinine, Ser 0.85 0.44 - 1.00 mg/dL   Calcium 9.0 8.9 - 10.3 mg/dL   Total Protein 7.1 6.5 - 8.1 g/dL   Albumin 3.5 3.5 - 5.0 g/dL   AST 28 15 - 41 U/L   ALT 31 14 - 54 U/L   Alkaline Phosphatase 102 38 - 126 U/L   Total Bilirubin 0.2 (L) 0.3 - 1.2 mg/dL   GFR calc non Af Amer >60 >60 mL/min   GFR calc Af Amer >60 >60 mL/min    Comment: (NOTE) The eGFR has been calculated using the CKD EPI equation. This calculation has not been validated in all clinical situations. eGFR's persistently <60 mL/min signify possible Chronic Kidney Disease.    Anion gap 7 5 - 15   No results found.  ROS review of systems negative. Blood pressure 110/64, pulse 82, temperature 98 F (36.7  C), temperature source Oral, resp. rate 17, height 5' (1.524 m), weight 89.4 kg (197 lb), SpO2  100 %. Physical Exam  patient is awake and alert. She is oriented and appropriate. Her speech is fluent. Judgment and insight are intact. Cranial nerve function normal bilaterally. Motor examination with weakness of her right-sided quadriceps muscle and right-sided anterior tibialis otherwise motor strength intact. Sensory examination was decreased sensation to pinprick and light touch in her right L4 and L5 dermatomes. Deep tendon reflexes normal active except her right patellar reflex is absent in both Achilles reflexes are absent. Examination head ears eyes and throat unremarkable. Chest and abdomen are benign. Extremities are free from injury deformity.  Assessment/Plan L4-5 degenerative spondylolisthesis with stenosis and radiculopathy. Plan bilateral L4-5 decompressive laminotomies and foraminotomies followed by posterior lumbar interbody fusion utilizing interbody cages, locally harvested autograft, and augmented with posterior lateral arthrodesis utilizing nonsegmental pedicle screw fixation and local autografting. Risks and benefits been explained. Patient wishes to proceed.  Charlie Pitter, MD 07/03/2017, 7:49 AM

## 2017-07-03 NOTE — Anesthesia Postprocedure Evaluation (Signed)
Anesthesia Post Note  Patient: Kara Kelley  Procedure(s) Performed: Procedure(s) (LRB): Posterior Lumbar Interbody Fusion Lumbar four-five  (N/A)     Patient location during evaluation: PACU Anesthesia Type: General Level of consciousness: awake and sedated Pain control: controlling pain was an issue. Vital Signs Assessment: post-procedure vital signs reviewed and stable Respiratory status: spontaneous breathing, nonlabored ventilation, respiratory function stable and patient connected to nasal cannula oxygen Cardiovascular status: blood pressure returned to baseline and stable Postop Assessment: no apparent nausea or vomiting Anesthetic complications: no    Last Vitals:  Vitals:   07/03/17 1155 07/03/17 1221  BP:  114/68  Pulse: 90 100  Resp:  18  Temp:  (!) 36.1 C  SpO2: 92% 96%    Last Pain:  Vitals:   07/03/17 1123  TempSrc:   PainSc: 10-Worst pain ever                 Carless Slatten,JAMES TERRILL

## 2017-07-03 NOTE — Transfer of Care (Signed)
Immediate Anesthesia Transfer of Care Note  Patient: Kara Kelley  Procedure(s) Performed: Procedure(s): Posterior Lumbar Interbody Fusion Lumbar four-five  (N/A)  Patient Location: PACU  Anesthesia Type:General  Level of Consciousness: awake, alert  and oriented  Airway & Oxygen Therapy: Patient Spontanous Breathing and Patient connected to face mask oxygen  Post-op Assessment: Report given to RN, Post -op Vital signs reviewed and stable and Patient moving all extremities X 4  Post vital signs: Reviewed and stable  Last Vitals:  Vitals:   07/03/17 0633  BP: 110/64  Pulse: 82  Resp: 17  Temp: 36.7 C  SpO2: 100%    Last Pain:  Vitals:   07/03/17 0633  TempSrc: Oral  PainSc: 8       Patients Stated Pain Goal: 3 (07/03/17 1610)  Complications: No apparent anesthesia complications

## 2017-07-03 NOTE — Evaluation (Signed)
Occupational Therapy Evaluation Patient Details Name: Kara Kelley MRN: [id number] DOB: [birth year] Today's Date: 07/03/2017    History of Present Illness Pt s/p PLIF at L4-L5 9/24. PMH: anxiety, ETOH abuse, DM, GERD, migraines, COPD, cocaine abuse, hep C, HTN PSH: mulitple foot surgeries, osteotomy, chole.   Clinical Impression   PTA, pt was independent with assistive devices for ADL and functional mobility. She currently requires min assist for LB ADL, min guard-min assist for toilet transfers, and supervision for standing grooming tasks. Pt able to verbalize 3/3 back precautions but requires cues to adhere to these during session. Initiated education concerning shower transfers with shower seat but pt will benefit from further reinforcement with RW next session. Additionally, plan to introduce AE for LB ADL next session. Do not anticipate need for further OT follow-up post-acute D/C but will continue to follow while admitted.     Follow Up Recommendations  No OT follow up;Supervision/Assistance - 24 hour    Equipment Recommendations  3 in 1 bedside commode;Tub/shower seat    Recommendations for Other Services       Precautions / Restrictions Precautions Precautions: Back Precaution Booklet Issued: Yes (comment) (provided by PT) Precaution Comments: Educated pt and family concerning back precautions related to ADL.  Required Braces or Orthoses: Spinal Brace Spinal Brace: Lumbar corset;Applied in sitting position Restrictions Weight Bearing Restrictions: No      Mobility Bed Mobility Overal bed mobility: Needs Assistance Bed Mobility: Rolling;Sidelying to Sit;Sit to Sidelying Rolling: Supervision Sidelying to sit: Min guard     Sit to sidelying: Min guard General bed mobility comments: Min guard assist for safety with VC's for technique to adhere to precautions.   Transfers Overall transfer level: Needs assistance Equipment used: None Transfers: Sit to/from  Stand Sit to Stand: Min guard         General transfer comment: increased time, slow, guarded but safe    Balance Overall balance assessment: Needs assistance Sitting-balance support: No upper extremity supported;Feet supported Sitting balance-Leahy Scale: Fair     Standing balance support: Single extremity supported;No upper extremity supported;During functional activity Standing balance-Leahy Scale: Fair Standing balance comment: Able to statically stand without UE support but requires at least single UE support for stability with dynamic standing tasks.                            ADL either performed or assessed with clinical judgement   ADL Overall ADL's : Needs assistance/impaired Eating/Feeding: Set up;Sitting   Grooming: Supervision/safety;Standing   Upper Body Bathing: Set up;Sitting   Lower Body Bathing: Minimal assistance;Sit to/from stand   Upper Body Dressing : Sitting;Supervision/safety Upper Body Dressing Details (indicate cue type and reason): VC's for technique to don/doff brace. Lower Body Dressing: Minimal assistance;Sit to/from stand   Toilet Transfer: Min guard;Ambulation;Comfort height toilet;Grab bars   Toileting- Clothing Manipulation and Hygiene: Min guard;Sit to/from stand   Tub/ Shower Transfer: Minimal assistance;Ambulation;3 in 1;Walk-in shower (without RW this session; will need to use RW on D/C)   Functional mobility during ADLs: Min guard;Minimal assistance General ADL Comments: Pt requiring up to min assist for functional mobility due to decreased stability. Would benefit from RW.      Vision Baseline Vision/History: Wears glasses Patient Visual Report: No change from baseline Vision Assessment?: No apparent visual deficits     Perception     Praxis      Pertinent Vitals/Pain Pain Assessment: 0-10 Pain Score: 8  Pain Location:  back/surgical site Pain Descriptors / Indicators: Sore Pain Intervention(s): Limited  activity within patient's tolerance;Monitored during session;Repositioned     Hand Dominance Right   Extremity/Trunk Assessment Upper Extremity Assessment Upper Extremity Assessment: Overall WFL for tasks assessed   Lower Extremity Assessment Lower Extremity Assessment: Generalized weakness   Cervical / Trunk Assessment Cervical / Trunk Assessment: Other exceptions Cervical / Trunk Exceptions: s/p spinal surgery   Communication Communication Communication: No difficulties   Cognition Arousal/Alertness: Awake/alert Behavior During Therapy: WFL for tasks assessed/performed Overall Cognitive Status: Within Functional Limits for tasks assessed                                     General Comments       Exercises     Shoulder Instructions      Home Living Family/patient expects to be discharged to:: Private residence Living Arrangements: Other relatives (sister) Available Help at Discharge: Family;Available 24 hours/day Type of Home: House Home Access: Stairs to enter [employer] of Steps: 4 Entrance Stairs-Rails: Can reach both Home Layout: One level     Bathroom Shower/Tub: Tub/shower unit;Walk-in shower   Bathroom Toilet: Handicapped height     Home Equipment: Cane - single point          Prior Functioning/Environment Level of Independence: Independent with assistive device(s)        Comments: used cane, wasn't able to drive due to pain        OT Problem List: Impaired balance (sitting and/or standing);Decreased knowledge of use of DME or AE;Decreased knowledge of precautions      OT Treatment/Interventions: Self-care/ADL training;Therapeutic exercise;Energy conservation;DME and/or AE instruction;Therapeutic activities;Patient/family education;Balance training    OT Goals(Current goals can be found in the care plan section) Acute Rehab OT Goals Patient Stated Goal: get back to walking OT Goal Formulation: With  patient/family Time For Goal Achievement: 07/17/17 Potential to Achieve Goals: Good ADL Goals Pt Will Perform Grooming: with modified independence;standing Pt Will Perform Upper Body Dressing: with modified independence;sitting (including brace) Pt Will Perform Lower Body Dressing: with min guard assist;with adaptive equipment;sit to/from stand Pt Will Transfer to Toilet: with modified independence;ambulating;bedside commode Pt Will Perform Toileting - Clothing Manipulation and hygiene: with modified independence;sit to/from stand Pt Will Perform Tub/Shower Transfer: Shower transfer;with modified independence;ambulating;shower seat;rolling walker  OT Frequency: Min 2X/week   Barriers to D/C:            Co-evaluation              AM-PAC PT "6 Clicks" Daily Activity     Outcome Measure Help from another person eating meals?: None Help from another person taking care of personal grooming?: A Little Help from another person toileting, which includes using toliet, bedpan, or urinal?: A Little Help from another person bathing (including washing, rinsing, drying)?: A Lot Help from another person to put on and taking off regular upper body clothing?: A Little Help from another person to put on and taking off regular lower body clothing?: A Lot 6 Click Score: 17   End of Session Equipment Utilized During Treatment: Rolling walker;Back brace  Activity Tolerance: Patient tolerated treatment well Patient left: in bed;with call bell/phone within reach;with family/visitor present  OT Visit Diagnosis: Other abnormalities of gait and mobility (R26.89)                Time: 1610-9604 OT Time Calculation (min): 19 min Charges:  OT  General Charges $OT Visit: 1 Visit OT Evaluation $OT Eval Moderate Complexity: 1 Mod G-Codes:     Doristine Section, MS OTR/L  Pager: [phone]   Rhylee Nunn A Jaki Hammerschmidt 07/03/2017, 4:55 PM

## 2017-07-03 NOTE — Op Note (Signed)
Date of procedure: 07/03/2017  Date of dictation: Same  Service: Neurosurgery  Preoperative diagnosis: Grade 1 L4-L5 degenerative spondylolisthesis with superimposed right L4-5 herniated nucleus pulposus with severe stenosis  Postoperative diagnosis: Same  Procedure Name: Bilateral L4-5 decompressive laminotomies and foraminotomies with microdiscectomies, more that would be required for simple interbody fusion alone.  L4-5 posterior lumbar interbody fusion utilizing interbody expandable cages and locally harvested autograft  L4-5 posterior lateral arthrodesis utilizing nonsegmental pedicle screw fixation and local autograft  Surgeon:Alaisha Eversley A.Kariya Lavergne, M.D.  Asst. Surgeon: Lovell Sheehan  Anesthesia: General  Indication: Patient is 61 year old female with severe back and bilateral lower external he symptoms right much greater than left. Workup demonstrates evidence of degenerative progressive grade 1 spondylolisthesis with associated disc herniation and severe stenosis. Patient presents now for decompression infusion in hopes of improving her symptoms.  Operative note: After induction of anesthesia, patient position prone onto Wilson frame and a properly padded. Lumbar region prepped and draped sterilely. Incision made overlying L4-5. Dissection performed bilaterally. Retractor placed. Fluoroscopy used. Levels confirmed. Decompressive laminotomies and foraminotomies performed using Leksell rongeurs and Kerrison rongeurs to remove the inferior two thirds of the lamina of L4 the pars and articularis of L4 and the inferior facet of L4 bilaterally. Superior facetectomies of L5 were performed bilaterally. Ligament flavum elevated and resected. Foraminotomies neck is of what was needed for fusion was then performed on course exiting L4 and L5 nerve roots bilaterally. Bilateral microdiscectomies and then performed removing all elements of the disc herniation. Disc spaces and prepared for interbody fusion. With  the distractor placed the patient's right side disc space was scraped clean on the left side. A 8 mm standard Medtronic expandable cage packed with locally harvested autograft was then impacted into place and expanded to its full extent. Disc space was then prepared on the contralateral side. Morselized autograft was packed and interspace. A second cage packed with autograft was then impacted into place and expanded to its full extent. Pedicles of L4 and L5 were identified using surface landmarks and intraoperative fluoroscopy. Superficial bone was removed overlying the pedicles of L4 and L5. Each pedicles and probed using pedicle awl each pedicle awl track was confirmed to be solidly within the bone. Each pedicle awl track was tapped and 5.75 mm radius brand screws [employer] were placed bilaterally at L4 and L5. Final images revealed good position of the screws and the cages at the proper upper level with normal alignment spine. Wound is irrigated one final time. Short segment titanium rod placed over the screw heads at L4 and L5. Locking caps placed over the screws. Locking caps and engaged with the construct are mild compression. Transverse processes and residual facets were decorticated. Morselize autograft packed posterior laterally. Gelfoam was placed over the laminotomy defects. Vancomycin powder was placed the deep wound space. Wounds and close in layers of Vicryl sutures. Steri-Strips and sterile dressing were applied. There were no apparent complications. Patient tolerated the procedure well and she returns to the recovery room postop.

## 2017-07-03 NOTE — Evaluation (Signed)
Physical Therapy Evaluation Patient Details Name: Kara Kelley MRN: [id number] DOB: [birth year] Today's Date: 07/03/2017   History of Present Illness  Pt s/p PLIF at L4-L5 9/24. PMH: anxiety, ETOH abuse, DM, GERD, migraines, COPD, cocaine abuse, hep C, HTN PSH: mulitple foot surgeries, osteotomy, chole.  Clinical Impression  Patient is s/p above surgery resulting in the deficits listed below (see PT Problem List). Pt tolerated mobility well. Patient will benefit from skilled PT to increase their independence and safety with mobility (while adhering to their precautions) to allow discharge to the venue listed below.     Follow Up Recommendations No PT follow up;Supervision/Assistance - 24 hour    Equipment Recommendations  3in1 (PT)    Recommendations for Other Services       Precautions / Restrictions Precautions Precautions: Back Precaution Booklet Issued: Yes (comment) Precaution Comments: family present, pt lethargic but verbalized understanding Required Braces or Orthoses: Spinal Brace Spinal Brace: Lumbar corset;Applied in sitting position Restrictions Weight Bearing Restrictions: No      Mobility  Bed Mobility Overal bed mobility: Needs Assistance Bed Mobility: Rolling;Sidelying to Sit;Sit to Sidelying Rolling: Min assist Sidelying to sit: Min assist     Sit to sidelying: Mod assist General bed mobility comments: v/c's for log roll technique to adhere to precautions. assist for LE management back into bed  Transfers Overall transfer level: Needs assistance Equipment used: None Transfers: Sit to/from Stand Sit to Stand: Min guard         General transfer comment: increased time, slow, guarded but safe  Ambulation/Gait Ambulation/Gait assistance: Min guard Ambulation Distance (Feet): 120 Feet Assistive device:  (pushed IV pole) Gait Pattern/deviations: Step-through pattern;Decreased stride length;Wide base of support Gait velocity: slow Gait velocity  interpretation: Below normal speed for age/gender General Gait Details: guarded but steady, pushed IV pole  Stairs            Wheelchair Mobility    Modified Rankin (Stroke Patients Only)       Balance Overall balance assessment: No apparent balance deficits (not formally assessed)                                           Pertinent Vitals/Pain Pain Assessment: 0-10 Pain Score: 10-Worst pain ever Pain Location: back/surgical site Pain Descriptors / Indicators: Sore Pain Intervention(s): Monitored during session    Home Living Family/patient expects to be discharged to:: Private residence Living Arrangements: Other relatives (sister) Available Help at Discharge: Family;Available 24 hours/day Type of Home: House Home Access: Stairs to enter Entrance Stairs-Rails: Can reach both Entrance Stairs-Number of Steps: 4 Home Layout: One level Home Equipment: Cane - single point      Prior Function Level of Independence: Independent with assistive device(s)         Comments: used cane, wasn't able to drive due to pain     Hand Dominance   Dominant Hand: Right    Extremity/Trunk Assessment   Upper Extremity Assessment Upper Extremity Assessment: Overall WFL for tasks assessed    Lower Extremity Assessment Lower Extremity Assessment: Generalized weakness    Cervical / Trunk Assessment Cervical / Trunk Assessment: Other exceptions (back surgery)  Communication   Communication: No difficulties  Cognition Arousal/Alertness: Lethargic;Suspect due to medications (but alert) Behavior During Therapy: WFL for tasks assessed/performed Overall Cognitive Status: Within Functional Limits for tasks assessed  General Comments      Exercises     Assessment/Plan    PT Assessment Patient needs continued PT services  PT Problem List Decreased strength;Decreased range of motion;Decreased  activity tolerance;Decreased balance;Decreased mobility;Decreased safety awareness;Decreased knowledge of precautions;Decreased knowledge of use of DME;Pain       PT Treatment Interventions DME instruction;Gait training;Stair training;Functional mobility training;Therapeutic activities;Therapeutic exercise;Balance training;Patient/family education    PT Goals (Current goals can be found in the Care Plan section)  Acute Rehab PT Goals Patient Stated Goal: get back to walking PT Goal Formulation: With patient Time For Goal Achievement: 07/10/17 Potential to Achieve Goals: Good    Frequency Min 5X/week   Barriers to discharge        Co-evaluation               AM-PAC PT "6 Clicks" Daily Activity  Outcome Measure Difficulty turning over in bed (including adjusting bedclothes, sheets and blankets)?: A Lot Difficulty moving from lying on back to sitting on the side of the bed? : A Little Difficulty sitting down on and standing up from a chair with arms (e.g., wheelchair, bedside commode, etc,.)?: A Little Help needed moving to and from a bed to chair (including a wheelchair)?: A Little Help needed walking in hospital room?: A Little Help needed climbing 3-5 steps with a railing? : A Lot 6 Click Score: 16    End of Session Equipment Utilized During Treatment: Gait belt;Back brace Activity Tolerance: Patient tolerated treatment well Patient left: in bed;with call bell/phone within reach;with family/visitor present Nurse Communication: Mobility status PT Visit Diagnosis: Unsteadiness on feet (R26.81);Pain;Difficulty in walking, not elsewhere classified (R26.2) Pain - part of body:  (back)    Time: 1420-1445 PT Time Calculation (min) (ACUTE ONLY): 25 min   Charges:   PT Evaluation $PT Eval Moderate Complexity: 1 Mod PT Treatments $Gait Training: 8-22 mins   PT G Codes:        Lewis Shock, PT, DPT Pager #: [phone] Office #: [phone]   Tawney Vanorman M  Emili Mcloughlin 07/03/2017, 2:57 PM

## 2017-07-03 NOTE — Anesthesia Procedure Notes (Signed)
Procedure Name: Intubation Date/Time: 07/03/2017 8:06 AM Performed by: Neldon Newport Pre-anesthesia Checklist: Timeout performed, Suction available, Patient being monitored, Emergency Drugs available and Patient identified Patient Re-evaluated:Patient Re-evaluated prior to induction Oxygen Delivery Method: Circle system utilized Preoxygenation: Pre-oxygenation with 100% oxygen Induction Type: IV induction Ventilation: Mask ventilation without difficulty and Oral airway inserted - appropriate to patient size Laryngoscope Size: Mac and 3 Grade View: Grade I Tube type: Oral Tube size: 7.0 mm Number of attempts: 1 Placement Confirmation: breath sounds checked- equal and bilateral,  positive ETCO2 and ETT inserted through vocal cords under direct vision Secured at: 21 cm Tube secured with: Tape Dental Injury: Teeth and Oropharynx as per pre-operative assessment

## 2017-07-03 NOTE — Brief Op Note (Signed)
07/03/2017  10:20 AM  PATIENT:  Loma Sousa  61 y.o. female  PRE-OPERATIVE DIAGNOSIS:  Spondylolisthesis  POST-OPERATIVE DIAGNOSIS:  Spondylolisthesis  PROCEDURE:  Procedure(s): Posterior Lumbar Interbody Fusion Lumbar four-five  (N/A)  SURGEON:  Surgeon(s) and Role:    * Julio Sicks, MD - Primary    * Tressie Stalker, MD - Assisting  PHYSICIAN ASSISTANT:   ASSISTANTS:    ANESTHESIA:   general  EBL:  Total I/O In: 1487.6 [I.V.:1487.6] Out: 450 [Urine:200; Blood:250]  BLOOD ADMINISTERED:none  DRAINS: none   LOCAL MEDICATIONS USED:  MARCAINE     SPECIMEN:  No Specimen  DISPOSITION OF SPECIMEN:  N/A  COUNTS:  YES  TOURNIQUET:  * No tourniquets in log *  DICTATION: .Dragon Dictation  PLAN OF CARE: Admit to inpatient   PATIENT DISPOSITION:  PACU - hemodynamically stable.   Delay start of Pharmacological VTE agent (>24hrs) due to surgical blood loss or risk of bleeding: yes

## 2017-07-04 LAB — GLUCOSE, CAPILLARY: Glucose-Capillary: 189 mg/dL — ABNORMAL HIGH (ref 65–99)

## 2017-07-04 MED ORDER — HYDROCODONE-ACETAMINOPHEN 10-325 MG PO TABS: 1.0000 | ORAL_TABLET | ORAL | 0 refills | Status: DC | PRN

## 2017-07-04 MED ORDER — DIAZEPAM 5 MG PO TABS: 5.0000 mg | ORAL_TABLET | Freq: Four times a day (QID) | ORAL | 0 refills | Status: DC | PRN

## 2017-07-04 NOTE — Progress Notes (Signed)
Inpatient Diabetes Program Recommendations  AACE/ADA: New Consensus Statement on Inpatient Glycemic Control (2015)  Target Ranges:  Prepandial:   less than 140 mg/dL      Peak postprandial:   less than 180 mg/dL (1-2 hours)      Critically ill patients:  140 - 180 mg/dL   Results for ALTHA, SWEITZER (MRN [id number]) as of 07/04/2017 08:14  Ref. Range 07/03/2017 06:36 07/03/2017 10:27 07/03/2017 12:17 07/03/2017 20:57 07/04/2017 07:31  Glucose-Capillary Latest Ref Range: 65 - 99 mg/dL 93 782 (H) 956 (H) 213 (H) 189 (H)   Review of Glycemic Control  Diabetes history: DM2 Outpatient Diabetes medications: Levemir 40 units QHS, Metformin 500 mg TID, Novolog 18 units TID with meals Current orders for Inpatient glycemic control: Levemir 40 units QHS, Novolog 0-20 units TID with meals, Novolog 0-5 units QHS, Metformin 500 mg TID  Inpatient Diabetes Program Recommendations: Insulin - Meal Coverage: While inpatient, please consider ordering Novolog 4 units TID with meals for meal coverage.  NOTE: Patient received Decadron 10 mg x 1 on 07/02/17 which is contributing to hyperglycemia.  Thanks, Orlando Penner, RN, MSN, CDE Diabetes Coordinator Inpatient Diabetes Program [phone] (Team Pager from 8am to 5pm)

## 2017-07-04 NOTE — Discharge Instructions (Signed)

## 2017-07-04 NOTE — Discharge Summary (Signed)
Physician Discharge Summary  Patient ID: Kara Kelley MRN: [id number] DOB/AGE: 01/06/1956 61 y.o.  Admit date: 07/03/2017 Discharge date: 07/04/2017  Admission Diagnoses:  Discharge Diagnoses:  Active Problems:   Degenerative spondylolisthesis   Discharged Condition: good  Hospital Course: Patient admitted to the hospital where she underwent uncomplicated L4-L5 decompression and fusion. Postoperatively doing well. Ambulating better. Standing straight or. Pain much improved. Ready for discharge home.  Consults:   Significant Diagnostic Studies:   Treatments:   Discharge Exam: Blood pressure (!) 95/57, pulse 93, temperature 98.7 F (37.1 C), temperature source Oral, resp. rate 18, height 5' (1.524 m), weight 89.4 kg (197 lb), SpO2 100 %. Awake and alert. Oriented and appropriate. Cranial nerve function intact. Motor and sensory function extremities normal. Wound clean and dry. Chest and abdomen benign.  Disposition: 01-Home or Self Care   Allergies as of 07/04/2017   No Known Allergies     Medication List    STOP taking these medications   HYDROcodone-acetaminophen 5-325 MG tablet Commonly known as:  NORCO/VICODIN Replaced by:  HYDROcodone-acetaminophen 10-325 MG tablet     TAKE these medications   ACCU-CHEK AVIVA PLUS w/Device Kit 1 Device by Does not apply route once.   albuterol 108 (90 Base) MCG/ACT inhaler Commonly known as:  PROVENTIL HFA;VENTOLIN HFA Inhale 2 puffs into the lungs every 6 (six) hours as needed. For shortness of breath.   atorvastatin 10 MG tablet Commonly known as:  LIPITOR Take 10 mg by mouth daily.   buPROPion 300 MG 24 hr tablet Commonly known as:  WELLBUTRIN XL Take 300 mg by mouth every morning.   diazepam 5 MG tablet Commonly known as:  VALIUM Take 1-2 tablets (5-10 mg total) by mouth every 6 (six) hours as needed for muscle spasms.   glucose blood test strip Commonly known as:  ACCU-CHEK AVIVA 1 each by Other route 2 (two)  times daily. And lancets 2/day 250.43 What changed:  when to take this  additional instructions   HYDROcodone-acetaminophen 10-325 MG tablet Commonly known as:  NORCO Take 1-2 tablets by mouth every 4 (four) hours as needed (breakthrough pain). Replaces:  HYDROcodone-acetaminophen 5-325 MG tablet   LEVEMIR FLEXTOUCH 100 UNIT/ML Pen Generic drug:  Insulin Detemir Inject 40 Units into the skin at bedtime.   lisinopril 2.5 MG tablet Commonly known as:  PRINIVIL,ZESTRIL Take 2.5 mg by mouth daily.   metFORMIN 500 MG tablet Commonly known as:  GLUCOPHAGE Take 500 mg by mouth 3 (three) times daily with meals.   multivitamin with minerals Tabs tablet Take 1 tablet by mouth daily. Centrum Silver   NOVOLOG FLEXPEN 100 UNIT/ML FlexPen Generic drug:  insulin aspart Inject 18 Units into the skin 3 (three) times daily as needed for high blood sugar.   omeprazole 40 MG capsule Commonly known as:  PRILOSEC Take 40 mg by mouth daily before breakfast.   prazosin 5 MG capsule Commonly known as:  MINIPRESS Take 10 mg by mouth at bedtime.   spironolactone 50 MG tablet Commonly known as:  ALDACTONE Take 50 mg by mouth daily.   traZODone 150 MG tablet Commonly known as:  DESYREL Take 150 mg by mouth at bedtime.            Durable Medical Equipment        Start     Ordered   07/03/17 1222  DME Walker rolling  Once    Question:  Patient needs a walker to treat with the following condition  Answer:  Degenerative spondylolisthesis   07/03/17 1221   07/03/17 1222  DME 3 n 1  Once     07/03/17 1221       Discharge Care Instructions        Start     Ordered   07/04/17 0000  diazepam (VALIUM) 5 MG tablet  Every 6 hours PRN     07/04/17 1016   07/04/17 0000  HYDROcodone-acetaminophen (NORCO) 10-325 MG tablet  Every 4 hours PRN     07/04/17 1016       Signed: Linlee Cromie A 07/04/2017, 10:17 AM

## 2017-07-04 NOTE — Progress Notes (Signed)
Physical Therapy Treatment Patient Details Name: Kara Kelley MRN: [id number] DOB: January 18, 1956 Today's Date: 07/04/2017    History of Present Illness Pt s/p PLIF at L4-L5 9/24. PMH: anxiety, ETOH abuse, DM, GERD, migraines, COPD, cocaine abuse, hep C, HTN PSH: mulitple foot surgeries, osteotomy, chole.    PT Comments    Patient progressing toward goals. Patient able to accurately recall back precautions. Patient able to perform all bed mobility with mod I and verbal cues for log roll technique. Patient ambulates with supervision for safety and is able to negotiate stairs with min assist. Patient instructed on proper use of cane. Patient with robust support system at home including 2 daughters who are CNA's. Patient is safe from a mobility standpoint to discharge to home. PT will continue to follow to maximize mobility.  Follow Up Recommendations  No PT follow up;Supervision/Assistance - 24 hour     Equipment Recommendations  3in1 (PT)    Recommendations for Other Services       Precautions / Restrictions Precautions Precautions: Back Precaution Booklet Issued: Yes (comment) Precaution Comments: Educated pt and family concerning back precautions related to ADL.  Required Braces or Orthoses: Spinal Brace Spinal Brace: Lumbar corset;Applied in sitting position Restrictions Weight Bearing Restrictions: No    Mobility  Bed Mobility Overal bed mobility: Needs Assistance Bed Mobility: Rolling;Sidelying to Sit Rolling: Modified independent (Device/Increase time) Sidelying to sit: Supervision (For safety)       General bed mobility comments: Patient required intermittent VCs for proper sequencing for log roll technique. Patient experienced a small increase in pain when rolling into sidelying, Patient used bed rails for assistance and educated on how to scoot while in supine without breaking precautions.   Transfers Overall transfer level: Needs assistance Equipment used:  Straight cane Transfers: Sit to/from Stand Sit to Stand: Supervision (For safety.)         General transfer comment: Increased time, slow, guarded but stable. Able to stand safely without cane.  Ambulation/Gait Ambulation/Gait assistance: Min guard (For safety. ) Ambulation Distance (Feet): 150 Feet Assistive device: Straight cane Gait Pattern/deviations: Step-through pattern;Step-to pattern;Decreased step length - right;Decreased step length - left;Decreased stride length;Trunk flexed Gait velocity: decreased Gait velocity interpretation: Below normal speed for age/gender General Gait Details: Patient VC'd on proper way to use cane. VC'd on sequencing and proper gait pattern.   Stairs Stairs: Yes   Stair Management: One rail Right;One rail Left;With cane Number of Stairs: 4 General stair comments: Patient VC'd on appropriate sequencing to negotiate stairs. Patient provided with min assist to prevent pt from posterior lean when unloading LE. Patient used UE on rail to pull forward and gain forward momentum when ascending stairs.  Wheelchair Mobility    Modified Rankin (Stroke Patients Only)       Balance Overall balance assessment: Needs assistance Sitting-balance support: No upper extremity supported;Feet supported Sitting balance-Leahy Scale: Fair Sitting balance - Comments: Able to manage and don brace in sitting.   Standing balance support: Single extremity supported;During functional activity Standing balance-Leahy Scale: Fair Standing balance comment: Able to statically stand without UE support but requires at least single UE support for stability with dynamic standing tasks.                             Cognition Arousal/Alertness: Awake/alert Behavior During Therapy: WFL for tasks assessed/performed Overall Cognitive Status: Within Functional Limits for tasks assessed  Exercises      General  Comments General comments (skin integrity, edema, etc.): Pt educated on general walking program.      Pertinent Vitals/Pain Pain Assessment: 0-10 Pain Score: 8  (By end of session.) Pain Location: back/surgical site Pain Descriptors / Indicators: Sore Pain Intervention(s): Limited activity within patient's tolerance;Monitored during session    Home Living                      Prior Function            PT Goals (current goals can now be found in the care plan section) Acute Rehab PT Goals Patient Stated Goal: get back to walking PT Goal Formulation: With patient Time For Goal Achievement: 07/10/17 Potential to Achieve Goals: Good Progress towards PT goals: Progressing toward goals    Frequency    Min 5X/week      PT Plan Current plan remains appropriate    Co-evaluation              AM-PAC PT "6 Clicks" Daily Activity  Outcome Measure  Difficulty turning over in bed (including adjusting bedclothes, sheets and blankets)?: A Little Difficulty moving from lying on back to sitting on the side of the bed? : A Little Difficulty sitting down on and standing up from a chair with arms (e.g., wheelchair, bedside commode, etc,.)?: A Little Help needed moving to and from a bed to chair (including a wheelchair)?: A Little Help needed walking in hospital room?: A Little Help needed climbing 3-5 steps with a railing? : A Little 6 Click Score: 18    End of Session Equipment Utilized During Treatment: Gait belt;Back brace Activity Tolerance: Patient tolerated treatment well Patient left: in bed;with call bell/phone within reach;with family/visitor present (Sitting up on EOB to drink coffee) Nurse Communication: Mobility status (Need for 3 in 1) PT Visit Diagnosis: Unsteadiness on feet (R26.81);Pain;Difficulty in walking, not elsewhere classified (R26.2) Pain - part of body:  (back)     Time: 1610-9604 PT Time Calculation (min) (ACUTE ONLY): 33 min  Charges:   $Gait Training: 23-37 mins                    G Codes:       Harue Pribble SPT   Lexis Potenza 07/04/2017, 10:12 AM

## 2017-07-04 NOTE — Therapy (Signed)
Occupational Therapy Treatment Patient Details Name: Kara Kelley MRN: [id number] DOB: 06-29-1956 Today's Date: 07/04/2017    History of present illness Pt s/p PLIF at L4-L5 9/24. PMH: anxiety, ETOH abuse, DM, GERD, migraines, COPD, cocaine abuse, hep C, HTN PSH: mulitple foot surgeries, osteotomy, chole.   OT comments  Focus of today's session on education for AE to increase independence with LB ADLs and review shower transfer technique. Pt able to use AE for LB addressing/bathing in sitting with supervision for safety. Provided educated on shower transfer technique and safe use of 3 in 1. Pt safe to d/c home when approved, OT will continue to follow acutely to address established goals.     Follow Up Recommendations  No OT follow up;Supervision/Assistance - 24 hour    Equipment Recommendations  3 in 1 bedside commode    Recommendations for Other Services      Precautions / Restrictions Precautions Precautions: Back Precaution Booklet Issued: Yes (comment) Precaution Comments: Pt able to recall 2/3 back precautions. Required Braces or Orthoses: Spinal Brace Spinal Brace: Lumbar corset;Applied in sitting position Restrictions Weight Bearing Restrictions: No       Mobility Bed Mobility Overal bed mobility: Needs Assistance Bed Mobility: Rolling;Sidelying to Sit Rolling: Modified independent (Device/Increase time) Sidelying to sit: Supervision (For safety)       General bed mobility comments: Pt sitting EOB with brace on upon OT arrival.   Transfers Overall transfer level: Needs assistance Equipment used: Straight cane Transfers: Sit to/from Stand Sit to Stand: Supervision (For safety.)         General transfer comment:     Balance Overall balance assessment: Needs assistance Sitting-balance support: No upper extremity supported;Feet supported Sitting balance-Leahy Scale: Good Sitting balance - Comments: Able to don/doff socks with AE sitting EOB with no  LOB   Standing balance support: Single extremity supported;During functional activity Standing balance-Leahy Scale: Fair Standing balance comment:                          ADL either performed or assessed with clinical judgement   ADL Overall ADL's : Needs assistance/impaired     Grooming: Supervision/safety;Standing;Oral care;Cueing for compensatory techniques Grooming Details (indicate cue type and reason): Educated pt on use of compensatory technique to avoid bending with brushing teeth     Lower Body Bathing: Minimal assistance;Sit to/from stand Lower Body Bathing Details (indicate cue type and reason): Pt educated on use of long handle sponge to increase independence with LB bathing   Upper Body Dressing Details (indicate cue type and reason): Reviewed brace wear schedule Lower Body Dressing: Minimal assistance;Sit to/from stand Lower Body Dressing Details (indicate cue type and reason): Pt educated on use of reacher and sock aide to increase independence with LB Dressing     Toileting- Clothing Manipulation and Hygiene: Minimal assistance;Sit to/from stand Toileting - Clothing Manipulation Details (indicate cue type and reason): Pt educated on AE to increase independence with peri care. Pt reports family can assist with this as needed.        General ADL Comments: Pt educated on AE to increase independence with ADLs. Pt issued a reacher and sock aide.     Vision   Vision Assessment?: No apparent visual deficits   Perception     Praxis      Cognition Arousal/Alertness: Awake/alert Behavior During Therapy: WFL for tasks assessed/performed Overall Cognitive Status: Within Functional Limits for tasks assessed  Exercises     Shoulder Instructions       General Comments Family present for all education provided.     Pertinent Vitals/ Pain       Pain Assessment: 0-10 Pain Score: 5  Pain  Location: back/surgical site Pain Descriptors / Indicators: Sore Pain Intervention(s): Premedicated before session;Monitored during session  Home Living Family/patient expects to be discharged to:: Private residence Living Arrangements: Other relatives (Pts sister) Available Help at Discharge: Family;Available 24 hours/day Type of Home: House Home Access: Stairs to enter [employer] of Steps: 4 Entrance Stairs-Rails: Can reach both Home Layout: One level     Bathroom Shower/Tub: Producer, television/film/video: Handicapped height Bathroom Accessibility: Yes How Accessible: Accessible via walker Home Equipment: Cane - single point          Prior Functioning/Environment              Frequency  Min 2X/week        Progress Toward Goals  OT Goals(current goals can now be found in the care plan section)  Progress towards OT goals: Progressing toward goals  Acute Rehab OT Goals Patient Stated Goal: To go home OT Goal Formulation: With patient/family Time For Goal Achievement: 07/17/17 Potential to Achieve Goals: Good  Plan Discharge plan remains appropriate    Co-evaluation                 AM-PAC PT "6 Clicks" Daily Activity     Outcome Measure   Help from another person eating meals?: None Help from another person taking care of personal grooming?: None Help from another person toileting, which includes using toliet, bedpan, or urinal?: A Little Help from another person bathing (including washing, rinsing, drying)?: A Little Help from another person to put on and taking off regular upper body clothing?: A Little Help from another person to put on and taking off regular lower body clothing?: A Little 6 Click Score: 20    End of Session Equipment Utilized During Treatment:  (AE)  OT Visit Diagnosis: Other abnormalities of gait and mobility (R26.89)   Activity Tolerance Patient tolerated treatment well   Patient Left in bed;with call  bell/phone within reach;with family/visitor present   Nurse Communication Mobility status        Time: 1610-9604 OT Time Calculation (min): 18 min  Charges:    Cammy Copa, OTS [phone]    Cammy Copa 07/04/2017, 10:30 AM

## 2017-07-04 NOTE — Progress Notes (Signed)
Patient is discharged from room 3C08 at this time. Alert and in stable condition. IV site d/c'd and instructions read to patient and daughter with understanding verbalized. Left unit via wheelchair with all belongings at side. 

## 2017-07-04 NOTE — Progress Notes (Signed)
OT Note - Addendum    07/04/17 1400  OT Visit Information  Last OT Received On 07/04/17  OT Time Calculation  OT Start Time (ACUTE ONLY) 1610  OT Stop Time (ACUTE ONLY) 0856  OT Time Calculation (min) 18 min  OT General Charges  $OT Visit 1 Visit  OT Treatments  $Self Care/Home Management  8-22 mins  [facility], OT/L  [phone] 07/04/2017

## 2017-07-07 MED FILL — Sodium Chloride IV Soln 0.9%: INTRAVENOUS | Qty: 1000 | Status: AC

## 2017-07-07 MED FILL — Heparin Sodium (Porcine) Inj 1000 Unit/ML: INTRAMUSCULAR | Qty: 30 | Status: AC

## 2017-07-17 ENCOUNTER — Emergency Department (HOSPITAL_COMMUNITY): Payer: Medicaid Other

## 2017-07-17 ENCOUNTER — Inpatient Hospital Stay (HOSPITAL_COMMUNITY)
Admission: EM | Admit: 2017-07-17 | Discharge: 2017-07-20 | DRG: 919 | Disposition: A | Discharge status: Home Health | Payer: Medicaid Other | Attending: Internal Medicine | Admitting: Internal Medicine

## 2017-07-17 ENCOUNTER — Encounter (HOSPITAL_COMMUNITY): Payer: Self-pay | Admitting: Emergency Medicine

## 2017-07-17 DIAGNOSIS — D62 Acute posthemorrhagic anemia: Secondary | ICD-10-CM

## 2017-07-17 DIAGNOSIS — R627 Adult failure to thrive: Secondary | ICD-10-CM | POA: Diagnosis present

## 2017-07-17 DIAGNOSIS — E871 Hypo-osmolality and hyponatremia: Secondary | ICD-10-CM | POA: Diagnosis present

## 2017-07-17 DIAGNOSIS — E860 Dehydration: Secondary | ICD-10-CM | POA: Diagnosis present

## 2017-07-17 DIAGNOSIS — E872 Acidosis, unspecified: Secondary | ICD-10-CM

## 2017-07-17 DIAGNOSIS — Z87891 Personal history of nicotine dependence: Secondary | ICD-10-CM | POA: Diagnosis not present

## 2017-07-17 DIAGNOSIS — Z7982 Long term (current) use of aspirin: Secondary | ICD-10-CM

## 2017-07-17 DIAGNOSIS — G9340 Encephalopathy, unspecified: Secondary | ICD-10-CM | POA: Diagnosis present

## 2017-07-17 DIAGNOSIS — N1832 Chronic kidney disease, stage 3b: Secondary | ICD-10-CM

## 2017-07-17 DIAGNOSIS — M545 Low back pain, unspecified: Secondary | ICD-10-CM

## 2017-07-17 DIAGNOSIS — Z8249 Family history of ischemic heart disease and other diseases of the circulatory system: Secondary | ICD-10-CM | POA: Diagnosis not present

## 2017-07-17 DIAGNOSIS — M1990 Unspecified osteoarthritis, unspecified site: Secondary | ICD-10-CM | POA: Diagnosis present

## 2017-07-17 DIAGNOSIS — E118 Type 2 diabetes mellitus with unspecified complications: Secondary | ICD-10-CM | POA: Diagnosis not present

## 2017-07-17 DIAGNOSIS — Z794 Long term (current) use of insulin: Secondary | ICD-10-CM

## 2017-07-17 DIAGNOSIS — F419 Anxiety disorder, unspecified: Secondary | ICD-10-CM | POA: Diagnosis present

## 2017-07-17 DIAGNOSIS — E1122 Type 2 diabetes mellitus with diabetic chronic kidney disease: Secondary | ICD-10-CM

## 2017-07-17 DIAGNOSIS — T8131XA Disruption of external operation (surgical) wound, not elsewhere classified, initial encounter: Secondary | ICD-10-CM | POA: Diagnosis not present

## 2017-07-17 DIAGNOSIS — R6510 Systemic inflammatory response syndrome (SIRS) of non-infectious origin without acute organ dysfunction: Secondary | ICD-10-CM

## 2017-07-17 DIAGNOSIS — A419 Sepsis, unspecified organism: Secondary | ICD-10-CM | POA: Diagnosis not present

## 2017-07-17 DIAGNOSIS — E119 Type 2 diabetes mellitus without complications: Secondary | ICD-10-CM

## 2017-07-17 DIAGNOSIS — B182 Chronic viral hepatitis C: Secondary | ICD-10-CM | POA: Diagnosis present

## 2017-07-17 DIAGNOSIS — G9341 Metabolic encephalopathy: Secondary | ICD-10-CM | POA: Diagnosis present

## 2017-07-17 DIAGNOSIS — F329 Major depressive disorder, single episode, unspecified: Secondary | ICD-10-CM | POA: Diagnosis present

## 2017-07-17 DIAGNOSIS — N179 Acute kidney failure, unspecified: Secondary | ICD-10-CM | POA: Diagnosis present

## 2017-07-17 DIAGNOSIS — E785 Hyperlipidemia, unspecified: Secondary | ICD-10-CM | POA: Diagnosis present

## 2017-07-17 DIAGNOSIS — I10 Essential (primary) hypertension: Secondary | ICD-10-CM | POA: Diagnosis present

## 2017-07-17 DIAGNOSIS — M4316 Spondylolisthesis, lumbar region: Secondary | ICD-10-CM | POA: Diagnosis present

## 2017-07-17 DIAGNOSIS — J449 Chronic obstructive pulmonary disease, unspecified: Secondary | ICD-10-CM | POA: Diagnosis present

## 2017-07-17 DIAGNOSIS — K219 Gastro-esophageal reflux disease without esophagitis: Secondary | ICD-10-CM | POA: Diagnosis present

## 2017-07-17 DIAGNOSIS — Z9049 Acquired absence of other specified parts of digestive tract: Secondary | ICD-10-CM | POA: Diagnosis not present

## 2017-07-17 DIAGNOSIS — E11319 Type 2 diabetes mellitus with unspecified diabetic retinopathy without macular edema: Secondary | ICD-10-CM

## 2017-07-17 DIAGNOSIS — T8149XA Infection following a procedure, other surgical site, initial encounter: Secondary | ICD-10-CM | POA: Diagnosis present

## 2017-07-17 DIAGNOSIS — Z981 Arthrodesis status: Secondary | ICD-10-CM

## 2017-07-17 DIAGNOSIS — E1165 Type 2 diabetes mellitus with hyperglycemia: Secondary | ICD-10-CM

## 2017-07-17 LAB — COMPREHENSIVE METABOLIC PANEL
ALT: 21 U/L (ref 14–54)
AST: 19 U/L (ref 15–41)
Albumin: 3.4 g/dL — ABNORMAL LOW (ref 3.5–5.0)
Alkaline Phosphatase: 130 U/L — ABNORMAL HIGH (ref 38–126)
Anion gap: 12 (ref 5–15)
BUN: 11 mg/dL (ref 6–20)
CHLORIDE: 98 mmol/L — AB (ref 101–111)
CO2: 21 mmol/L — ABNORMAL LOW (ref 22–32)
CREATININE: 1.32 mg/dL — AB (ref 0.44–1.00)
Calcium: 9 mg/dL (ref 8.9–10.3)
GFR calc Af Amer: 50 mL/min — ABNORMAL LOW (ref >60)
GFR, EST NON AFRICAN AMERICAN: 43 mL/min — AB (ref >60)
Glucose, Bld: 385 mg/dL — ABNORMAL HIGH (ref 65–99)
Potassium: 4.1 mmol/L (ref 3.5–5.1)
Sodium: 131 mmol/L — ABNORMAL LOW (ref 135–145)
Total Bilirubin: 0.5 mg/dL (ref 0.3–1.2)
Total Protein: 8 g/dL (ref 6.5–8.1)

## 2017-07-17 LAB — URINALYSIS, ROUTINE W REFLEX MICROSCOPIC
BILIRUBIN URINE: NEGATIVE
Glucose, UA: 50 mg/dL — AB
HGB URINE DIPSTICK: NEGATIVE
KETONES UR: NEGATIVE mg/dL
LEUKOCYTES UA: NEGATIVE
NITRITE: NEGATIVE
PH: 6 (ref 5.0–8.0)
PROTEIN: NEGATIVE mg/dL
SPECIFIC GRAVITY, URINE: 1.018 (ref 1.005–1.030)

## 2017-07-17 LAB — I-STAT CG4 LACTIC ACID, ED
LACTIC ACID, VENOUS: 3.06 mmol/L — AB (ref 0.5–1.9)
LACTIC ACID, VENOUS: 1.46 mmol/L (ref 0.5–1.9)

## 2017-07-17 LAB — CBC WITH DIFFERENTIAL/PLATELET
Basophils Absolute: 0 10*3/uL (ref 0.0–0.1)
Basophils Relative: 0 %
Eosinophils Absolute: 0.1 10*3/uL (ref 0.0–0.7)
Eosinophils Relative: 1 %
HEMATOCRIT: 28.8 % — AB (ref 36.0–46.0)
Hemoglobin: 9.5 g/dL — ABNORMAL LOW (ref 12.0–15.0)
LYMPHS PCT: 21 %
Lymphs Abs: 2.3 10*3/uL (ref 0.7–4.0)
MCH: 24.9 pg — ABNORMAL LOW (ref 26.0–34.0)
MCHC: 33 g/dL (ref 30.0–36.0)
MCV: 75.4 fL — AB (ref 78.0–100.0)
Monocytes Absolute: 0.4 10*3/uL (ref 0.1–1.0)
Monocytes Relative: 4 %
NEUTROS ABS: 8.4 10*3/uL — AB (ref 1.7–7.7)
Neutrophils Relative %: 74 %
PLATELETS: 195 10*3/uL (ref 150–400)
RBC: 3.82 MIL/uL — AB (ref 3.87–5.11)
RDW: 14.1 % (ref 11.5–15.5)
WBC: 11.2 10*3/uL — AB (ref 4.0–10.5)

## 2017-07-17 LAB — LIPASE, BLOOD: Lipase: 22 U/L (ref 11–51)

## 2017-07-17 MED ORDER — POLYETHYLENE GLYCOL 3350 17 G PO PACK
17.0000 g | PACK | Freq: Every day | ORAL | Status: DC
  Administered 2017-07-18 – 2017-07-20 (×3): 17 g via ORAL
  Filled 2017-07-17 (×3): qty 1

## 2017-07-17 MED ORDER — INSULIN ASPART 100 UNIT/ML ~~LOC~~ SOLN
0.0000 [IU] | SUBCUTANEOUS | Status: DC
  Administered 2017-07-18: 7 [IU] via SUBCUTANEOUS
  Administered 2017-07-18: 2 [IU] via SUBCUTANEOUS
  Administered 2017-07-18: 3 [IU] via SUBCUTANEOUS

## 2017-07-17 MED ORDER — ATORVASTATIN CALCIUM 10 MG PO TABS
10.0000 mg | ORAL_TABLET | Freq: Every day | ORAL | Status: DC
  Administered 2017-07-18 – 2017-07-20 (×3): 10 mg via ORAL
  Filled 2017-07-17 (×3): qty 1

## 2017-07-17 MED ORDER — VANCOMYCIN HCL IN DEXTROSE 750-5 MG/150ML-% IV SOLN
750.0000 mg | Freq: Two times a day (BID) | INTRAVENOUS | Status: DC
  Administered 2017-07-18: 750 mg via INTRAVENOUS
  Filled 2017-07-17 (×2): qty 150

## 2017-07-17 MED ORDER — IOPAMIDOL (ISOVUE-300) INJECTION 61%
100.0000 mL | Freq: Once | INTRAVENOUS | Status: AC | PRN
  Administered 2017-07-17: 100 mL via INTRAVENOUS

## 2017-07-17 MED ORDER — ONDANSETRON HCL 4 MG PO TABS: 4.0000 mg | ORAL_TABLET | Freq: Four times a day (QID) | ORAL | Status: DC | PRN

## 2017-07-17 MED ORDER — PIPERACILLIN-TAZOBACTAM 3.375 G IVPB
3.3750 g | Freq: Three times a day (TID) | INTRAVENOUS | Status: DC
  Administered 2017-07-18: 3.375 g via INTRAVENOUS
  Filled 2017-07-17 (×3): qty 50

## 2017-07-17 MED ORDER — SODIUM CHLORIDE 0.9 % IV BOLUS (SEPSIS)
1000.0000 mL | Freq: Once | INTRAVENOUS | Status: AC
  Administered 2017-07-17: 1000 mL via INTRAVENOUS

## 2017-07-17 MED ORDER — PIPERACILLIN-TAZOBACTAM 3.375 G IVPB 30 MIN
3.3750 g | Freq: Once | INTRAVENOUS | Status: AC
Start: 2017-07-17 — End: 2017-07-17
  Administered 2017-07-17: 3.375 g via INTRAVENOUS
  Filled 2017-07-17: qty 50

## 2017-07-17 MED ORDER — POLYETHYLENE GLYCOL 3350 17 G PO PACK: 17.0000 g | PACK | Freq: Every day | ORAL | Status: DC | PRN

## 2017-07-17 MED ORDER — HYDROCODONE-ACETAMINOPHEN 5-325 MG PO TABS
1.0000 | ORAL_TABLET | ORAL | Status: DC | PRN
  Administered 2017-07-18 (×4): 2 via ORAL
  Administered 2017-07-19: 1 via ORAL
  Administered 2017-07-19 (×3): 2 via ORAL
  Administered 2017-07-20 (×2): 1 via ORAL
  Administered 2017-07-20: 2 via ORAL
  Filled 2017-07-17: qty 2
  Filled 2017-07-17: qty 1
  Filled 2017-07-17 (×3): qty 2
  Filled 2017-07-17: qty 1
  Filled 2017-07-17 (×4): qty 2
  Filled 2017-07-17: qty 1

## 2017-07-17 MED ORDER — SODIUM CHLORIDE 0.9 % IV BOLUS (SEPSIS)
1000.0000 mL | Freq: Once | INTRAVENOUS | Status: AC
Start: 2017-07-17 — End: 2017-07-17
  Administered 2017-07-17: 1000 mL via INTRAVENOUS

## 2017-07-17 MED ORDER — TRAZODONE HCL 150 MG PO TABS: 150.0000 mg | ORAL_TABLET | Freq: Every day | ORAL | Status: DC

## 2017-07-17 MED ORDER — IOPAMIDOL (ISOVUE-300) INJECTION 61%
INTRAVENOUS | Status: AC
  Filled 2017-07-17: qty 100

## 2017-07-17 MED ORDER — ACETAMINOPHEN 325 MG PO TABS: 650.0000 mg | ORAL_TABLET | Freq: Four times a day (QID) | ORAL | Status: DC | PRN

## 2017-07-17 MED ORDER — ONDANSETRON HCL 4 MG/2ML IJ SOLN: 4.0000 mg | Freq: Four times a day (QID) | INTRAMUSCULAR | Status: DC | PRN

## 2017-07-17 MED ORDER — ALBUTEROL SULFATE HFA 108 (90 BASE) MCG/ACT IN AERS: 2.0000 | INHALATION_SPRAY | RESPIRATORY_TRACT | Status: DC | PRN

## 2017-07-17 MED ORDER — BISACODYL 10 MG RE SUPP: 10.0000 mg | Freq: Every day | RECTAL | Status: DC | PRN

## 2017-07-17 MED ORDER — SODIUM CHLORIDE 0.9 % IV SOLN
INTRAVENOUS | Status: DC
  Administered 2017-07-18: via INTRAVENOUS

## 2017-07-17 MED ORDER — VANCOMYCIN HCL IN DEXTROSE 1-5 GM/200ML-% IV SOLN
1000.0000 mg | Freq: Once | INTRAVENOUS | Status: AC
  Administered 2017-07-17: 1000 mg via INTRAVENOUS
  Filled 2017-07-17: qty 200

## 2017-07-17 MED ORDER — SENNA 8.6 MG PO TABS
1.0000 | ORAL_TABLET | Freq: Two times a day (BID) | ORAL | Status: DC
  Administered 2017-07-18 – 2017-07-20 (×5): 8.6 mg via ORAL
  Filled 2017-07-17 (×5): qty 1

## 2017-07-17 MED ORDER — ACETAMINOPHEN 650 MG RE SUPP: 650.0000 mg | Freq: Four times a day (QID) | RECTAL | Status: DC | PRN

## 2017-07-17 MED ORDER — ALBUTEROL SULFATE (2.5 MG/3ML) 0.083% IN NEBU: 2.5000 mg | INHALATION_SOLUTION | RESPIRATORY_TRACT | Status: DC | PRN

## 2017-07-17 MED ORDER — BUPROPION HCL ER (XL) 300 MG PO TB24
300.0000 mg | ORAL_TABLET | Freq: Every morning | ORAL | Status: DC
  Administered 2017-07-18 – 2017-07-20 (×3): 300 mg via ORAL
  Filled 2017-07-17 (×4): qty 1

## 2017-07-17 MED ORDER — INSULIN DETEMIR 100 UNIT/ML ~~LOC~~ SOLN
30.0000 [IU] | Freq: Every day | SUBCUTANEOUS | Status: DC
  Administered 2017-07-18 – 2017-07-19 (×3): 30 [IU] via SUBCUTANEOUS
  Filled 2017-07-17 (×3): qty 0.3

## 2017-07-17 NOTE — H&P (Signed)
Kara Kelley:[id number] DOB: 12-22-1955 DOA: 07/17/2017     PCP: Dorothyann Peng, MD   Outpatient Specialists: NS Dr. Jordan Likes Patient coming from:  home Lives   With family    Chief Complaint:  serosanguinous drainage from the wound  HPI: Kara Kelley is a 61 y.o. female with medical history significant of L4-5 spondylolisthesis  Sp posterior lumbar interbody fusion and autograft by Dr. Jordan Likes on 07/03/2017, asthma, DM 2, hx of polysubstance abuse, HTN    Presented with poor appetite and confusion ever since her surgery. She have not eaten for the past 3 days. Family gave her a bath today and noted drainage from the sight  reports chills and subjective fever No appetite severe back pain. denies urinary or bowel incontinence She has chronic right leg  weakness that is unchanged from prior to Lumbar fusion.    Regarding pertinent Chronic problems: DM 2 takes 40 units at night, reports good control   IN ER:  Temp (24hrs), Avg:98.7 F (37.1 C), Min:98.6 F (37 C), Max:98.8 F (37.1 C)      on arrival  ED Triage Vitals  Enc Vitals Group     BP 07/17/17 1443 101/62     Pulse Rate 07/17/17 1443 (!) 108     Resp 07/17/17 1443 16     Temp 07/17/17 1443 98.8 F (37.1 C)     Temp Source 07/17/17 1443 Oral     SpO2 07/17/17 1443 95 %     Weight 07/17/17 2020 196 lb (88.9 kg)     Height 07/17/17 2020 5' (1.524 m)     Head Circumference --      Peak Flow --      Pain Score --      Pain Loc --      Pain Edu? --      Excl. in GC? --     Latest RR24 100% BP 106/72 Lactic acid 3.06>1.06 Following Medications were ordered in ER: Medications  iopamidol (ISOVUE-300) 61 % injection (not administered)  sodium chloride 0.9 % bolus 1,000 mL (1,000 mLs Intravenous New Bag/Given 07/17/17 2035)    And  sodium chloride 0.9 % bolus 1,000 mL (not administered)  piperacillin-tazobactam (ZOSYN) IVPB 3.375 g (3.375 g Intravenous New Bag/Given 07/17/17 2027)  vancomycin (VANCOCIN) IVPB  1000 mg/200 mL premix (not administered)  sodium chloride 0.9 % bolus 1,000 mL (1,000 mLs Intravenous New Bag/Given 07/17/17 1811)  sodium chloride 0.9 % bolus 1,000 mL (1,000 mLs Intravenous New Bag/Given 07/17/17 1740)  iopamidol (ISOVUE-300) 61 % injection 100 mL (100 mLs Intravenous Contrast Given 07/17/17 1856)     ER provider discussed case with: Dr. Lovell Sheehan Who recommends: transfer to [facility] for NS evaluation We'll see patient in consult    Hospitalist was called for admission for Sepsis possibly due to Wound infection  Review of Systems:    Pertinent positives include: confusion  Constitutional:  No weight loss, night sweats, Fevers, chills, fatigue, weight loss  HEENT:  No headaches, Difficulty swallowing,Tooth/dental problems,Sore throat,  No sneezing, itching, ear ache, nasal congestion, post nasal drip,  Cardio-vascular:  No chest pain, Orthopnea, PND, anasarca, dizziness, palpitations.no Bilateral lower extremity swelling  GI:  No heartburn, indigestion, abdominal pain, nausea, vomiting, diarrhea, change in bowel habits, loss of appetite, melena, blood in stool, hematemesis Resp:  no shortness of breath at rest. No dyspnea on exertion, No excess mucus, no productive cough, No non-productive cough, No coughing up of blood.No change in color of  mucus.No wheezing. Skin:  no rash or lesions. No jaundice GU:  no dysuria, change in color of urine, no urgency or frequency. No straining to urinate.  No flank pain.  Musculoskeletal:  No joint pain or no joint swelling. No decreased range of motion. No back pain.  Psych:  No change in mood or affect. No depression or anxiety. No memory loss.  Neuro: no localizing neurological complaints, no tingling, no weakness, no double vision, no gait abnormality, no slurred speech, no   As per HPI otherwise 10 point review of systems negative.   Past Medical History: Past Medical History:  Diagnosis Date  . Anemia   . Anxiety   .  Arthritis   . Asthma   . Bunion   . Callus   . Chronic pain   . Cocaine abuse (HCC)   . COPD (chronic obstructive pulmonary disease) (HCC)   . Corns and callosities   . Degenerative joint disease   . Depression   . Diabetes mellitus   . Endometrial polyp   . ETOH abuse   . Gall stones   . GERD (gastroesophageal reflux disease)   . Headache    history of Migraines  . Hepatitis C    Hep C  . Hyperlipidemia   . Hypertension   . Insomnia   . Spondylolisthesis of lumbar region   . Substance abuse (HCC)    alcoholism  . Tuberculosis 1985  . Wears dentures   . Wears glasses    Past Surgical History:  Procedure Laterality Date  . CESAREAN SECTION     x3  . CHOLECYSTECTOMY N/A 08/11/2016   Procedure: LAPAROSCOPIC CHOLECYSTECTOMY WITH   INTRAOPERATIVE CHOLANGIOGRAM;  Surgeon: Almond Lint, MD;  Location: [facility];  Service: General;  Laterality: N/A;  . COLONOSCOPY    . Cotton Osteotomy w/ Graft Left 06/18/2009  . Excision of Benign Lesion Right 01/30/2013   Rt Plantar  . FOOT SURGERY    . HAMMER TOE REPAIR Right 02/12/2016   RIGHT #5  . Hammertoe Repair Left 06/18/2009   Lt #5  . HYSTEROSCOPY N/A 06/20/2017   Procedure: DILATION AND CURETTAGE, HYSTEROSCOPY w/ Polypectomy;  Surgeon: Willodean Rosenthal, MD;  Location: [facility];  Service: Gynecology;  Laterality: N/A;  . METATARSAL OSTEOTOMY Left 06/18/2009   #5  . MULTIPLE TOOTH EXTRACTIONS    . Nail Matrixectomy Left 06/18/2009   LT #1  . OSTEOTOMY Right 01/30/2013   Rt #5  . Phalangectomy Left 06/18/2009   LT #1  . Phalangectomy Right 01/30/2013   Rt #1  . TUBAL LIGATION       Social History:  Ambulatory  cane,    reports that she quit smoking about 3 months ago. Her smoking use included Cigarettes. She smoked 0.50 packs per day. She has never used smokeless tobacco. She reports that she does not drink alcohol or use drugs.  Allergies:  No Known Allergies  Family History:   Family History  Problem Relation Age of  Onset  . Heart disease Mother   . Diabetes Unknown        mat great aunt  . Cirrhosis Unknown        mat great aunt  . Cirrhosis Unknown        mat great uncles x 2  . Colon cancer Neg Hx   . Breast cancer Neg Hx     Medications: Prior to Admission medications   Medication Sig Start Date End Date Taking? Authorizing Provider  albuterol (PROVENTIL  HFA;VENTOLIN HFA) 108 (90 BASE) MCG/ACT inhaler Inhale 2 puffs into the lungs every 6 (six) hours as needed. For shortness of breath.   Yes [provider]  atorvastatin (LIPITOR) 10 MG tablet Take 10 mg by mouth daily.   Yes [provider]  buPROPion (WELLBUTRIN XL) 300 MG 24 hr tablet Take 300 mg by mouth every morning. 05/14/15  Yes [provider]  diazepam (VALIUM) 5 MG tablet Take 1-2 tablets (5-10 mg total) by mouth every 6 (six) hours as needed for muscle spasms. 07/04/17  Yes Pool, Sherilyn Cooter, MD  HYDROcodone-acetaminophen (NORCO) 10-325 MG tablet Take 1-2 tablets by mouth every 4 (four) hours as needed (breakthrough pain). 07/04/17  Yes Pool, Sherilyn Cooter, MD  LEVEMIR FLEXTOUCH 100 UNIT/ML Pen Inject 40 Units into the skin at bedtime.  07/20/16  Yes [provider]  lisinopril (PRINIVIL,ZESTRIL) 2.5 MG tablet Take 2.5 mg by mouth daily. 06/07/17  Yes [provider]  metFORMIN (GLUCOPHAGE) 500 MG tablet Take 500 mg by mouth 3 (three) times daily with meals.   Yes [provider]  Multiple Vitamin (MULTIVITAMIN WITH MINERALS) TABS tablet Take 1 tablet by mouth daily. Centrum Silver   Yes [provider]  NOVOLOG FLEXPEN 100 UNIT/ML FlexPen Inject 18 Units into the skin 3 (three) times daily as needed for high blood sugar.  07/20/16  Yes [provider]  omeprazole (PRILOSEC) 40 MG capsule Take 40 mg by mouth daily before breakfast.  07/19/16  Yes [provider]  prazosin (MINIPRESS) 5 MG capsule Take 10 mg by mouth at bedtime. 05/10/17  Yes [provider]    spironolactone (ALDACTONE) 50 MG tablet Take 50 mg by mouth daily.   Yes [provider]  traZODone (DESYREL) 150 MG tablet Take 150 mg by mouth at bedtime. 05/10/17  Yes [provider]  Blood Glucose Monitoring Suppl (ACCU-CHEK AVIVA PLUS) W/DEVICE KIT 1 Device by Does not apply route once. 06/10/14   Romero Belling, MD  glucose blood (ACCU-CHEK AVIVA) test strip 1 each by Other route 2 (two) times daily. And lancets 2/day 250.43 Patient taking differently: 1 each by Other route 3 (three) times daily. And lancets 2/day 250.43 06/10/14   Romero Belling, MD    Physical Exam: Patient Vitals for the past 24 hrs:  BP Temp Temp src Pulse Resp SpO2 Height Weight  07/17/17 2020 - - - - - - 5' (1.524 m) 88.9 kg (196 lb)  07/17/17 1923 (!) 106/43 - - 90 (!) 24 100 % - -  07/17/17 1916 (!) 89/79 - - 91 14 100 % - -  07/17/17 1830 (!) 107/92 - - 89 (!) 21 98 % - -  07/17/17 1811 - 98.6 F (37 C) Rectal - - - - -  07/17/17 1800 95/69 - - 91 (!) 24 99 % - -  07/17/17 1756 99/81 - - 95 (!) 22 100 % - -  07/17/17 1730 (!) 80/49 - - - (!) 29 - - -  07/17/17 1727 (!) 81/53 - - - (!) 21 - - -  07/17/17 1711 (!) 88/55 - - 91 16 94 % - -  07/17/17 1443 101/62 98.8 F (37.1 C) Oral (!) 108 16 95 % - -    1. General:  in No Acute distress  Chronically ill  -appearing 2. Psychological: Alert and   Oriented 3. Head/ENT:   Dry Mucous Membranes  Head Non traumatic, neck supple                     Poor Dentition 4. SKIN:   decreased Skin turgor,  Skin clean Dry and intact no rash 5. Heart: Regular rate and rhythm no  Murmur, no Rub or gallop 6. Lungs: no wheezes or crackles   7. Abdomen: Soft, non-tender, Non distended    obese  bowel sounds present 8. Lower extremities: no clubbing, cyanosis, or edema 9. Neurologically Grossly intact, moving all 4 extremities equally   10. MSK: Normal range of motion Lumbar region diffuse severe pain and crepitus  body mass index is  38.28 kg/m.  Labs on Admission:   Labs on Admission: I have personally reviewed following labs and imaging studies  CBC:  Recent Labs Lab 07/17/17 1634  WBC 11.2*  NEUTROABS 8.4*  HGB 9.5*  HCT 28.8*  MCV 75.4*  PLT 195   Basic Metabolic Panel:  Recent Labs Lab 07/17/17 1634  NA 131*  K 4.1  CL 98*  CO2 21*  GLUCOSE 385*  BUN 11  CREATININE 1.32*  CALCIUM 9.0   GFR: Estimated Creatinine Clearance: 45.6 mL/min (A) (by C-G formula based on SCr of 1.32 mg/dL (H)). Liver Function Tests:  Recent Labs Lab 07/17/17 1634  AST 19  ALT 21  ALKPHOS 130*  BILITOT 0.5  PROT 8.0  ALBUMIN 3.4*    Recent Labs Lab 07/17/17 1634  LIPASE 22   No results for input(s): AMMONIA in the last 168 hours. Coagulation Profile: No results for input(s): INR, PROTIME in the last 168 hours. Cardiac Enzymes: No results for input(s): CKTOTAL, CKMB, CKMBINDEX, TROPONINI in the last 168 hours. BNP (last 3 results) No results for input(s): PROBNP in the last 8760 hours. HbA1C: No results for input(s): HGBA1C in the last 72 hours. CBG: No results for input(s): GLUCAP in the last 168 hours. Lipid Profile: No results for input(s): CHOL, HDL, LDLCALC, TRIG, CHOLHDL, LDLDIRECT in the last 72 hours. Thyroid Function Tests: No results for input(s): TSH, T4TOTAL, FREET4, T3FREE, THYROIDAB in the last 72 hours. Anemia Panel: No results for input(s): VITAMINB12, FOLATE, FERRITIN, TIBC, IRON, RETICCTPCT in the last 72 hours. Urine analysis:    Component Value Date/Time   COLORURINE YELLOW 07/17/2017 1954   APPEARANCEUR CLEAR 07/17/2017 1954   LABSPEC 1.018 07/17/2017 1954   PHURINE 6.0 07/17/2017 1954   GLUCOSEU 50 (A) 07/17/2017 1954   HGBUR NEGATIVE 07/17/2017 1954   BILIRUBINUR NEGATIVE 07/17/2017 1954   KETONESUR NEGATIVE 07/17/2017 1954   PROTEINUR NEGATIVE 07/17/2017 1954   UROBILINOGEN 0.2 06/18/2015 1409   NITRITE NEGATIVE 07/17/2017 1954   LEUKOCYTESUR NEGATIVE 07/17/2017  1954   Sepsis Labs: @LABRCNTIP (procalcitonin:4,lacticidven:4) )No results found for this or any previous visit (from the past 240 hour(s)).    UA   no evidence of UTI     Lab Results  Component Value Date   HGBA1C 7.1 (H) 07/03/2017    Estimated Creatinine Clearance: 45.6 mL/min (A) (by C-G formula based on SCr of 1.32 mg/dL (H)).  BNP (last 3 results) No results for input(s): PROBNP in the last 8760 hours.   ECG REPORT Not obtain  [facility] Weights   07/17/17 2020  Weight: 88.9 kg (196 lb)    Cultures:    Component Value Date/Time   SDES VAGINA 07/18/2011 2120   SPECREQUEST NONE 07/18/2011 2120   CULT No Herpes Simplex Virus detected. 07/18/2011 2120   REPTSTATUS 07/20/2011 FINAL 07/18/2011 2120  Radiological Exams on Admission: Dg Chest 2 View  Result Date: 07/17/2017 CLINICAL DATA:  Lower abdominal pain and back pain. Problems since back surgery in September. EXAM: CHEST  2 VIEW COMPARISON:  06/18/2015 FINDINGS: Soft tissue fullness in the paratracheal region is similar to the previous examination and probably related to vascular structures. There is a chronic density or calcification in the lateral left lower lung. Heart size is within normal limits and stable. No focal airspace disease or pulmonary edema. Degenerative endplate changes in lower thoracic spine. No large pleural effusions. IMPRESSION: No active cardiopulmonary disease. Electronically Signed   By: Richarda Overlie M.D.   On: 07/17/2017 18:05   Ct Abdomen Pelvis W Contrast  Result Date: 07/17/2017 CLINICAL DATA:  Back surgery in September 24th. Patient has not been eating well. EXAM: CT ABDOMEN AND PELVIS WITH CONTRAST TECHNIQUE: Multidetector CT imaging of the abdomen and pelvis was performed using the standard protocol following bolus administration of intravenous contrast. CONTRAST:  ISOVUE-300 IOPAMIDOL (ISOVUE-300) INJECTION 61% COMPARISON:  05/21/2011 FINDINGS: Lower chest: Lung bases are clear.  Hepatobiliary: Gallbladder has been removed. Portal venous system is patent. No suspicious liver lesions. Pancreas: Normal appearance of the pancreas without inflammation or duct dilatation. Spleen: Normal appearance of spleen without enlargement. Adrenals/Urinary Tract: Normal adrenal glands. Tiny stone in the right kidney lower pole without hydronephrosis. Mild distention of the urinary bladder. Normal appearance the left kidney without hydronephrosis. Stomach/Bowel: Small hiatal hernia. No bowel dilatation or obstruction. No acute inflammatory changes in the bowel. Vascular/Lymphatic: Mild atherosclerotic disease in the aorta and iliac vessels without aneurysm. IVC and iliac veins are patent. No significant lymph node enlargement in the abdomen or pelvis. Reproductive: Uterus and bilateral adnexa are unremarkable. Other: There is an air-fluid collection in the subcutaneous tissues posterior to the lumbar spine and surgical level. This collection measures 11 x 4.4 x 7.1 cm. Air is the largest component to this collection. There is subcutaneous edema surrounding this collection. This collection does not extend into the paraspinal musculature. Again noted is a finger-like projection of tissue along the anterior abdominal cavity above the umbilicus. This could be associated with a ventral hernia. There is a new small nodular component just cephalad to the chronic larger finger-like projection. This could be associated with fat necrosis. No other suspicious abdominal nodules. There is an umbilical hernia containing fat. Musculoskeletal: Bilateral pedicle screw and rod fixation at L5-S1 with interbody device. Surgical hardware is intact. Again noted is a large Schmorl's node along the superior endplate of T12. IMPRESSION: Large gas-filled collection in the subcutaneous tissues posterior to the lumbar spine surgery site. This collection measures over 11 cm. Findings are concerning for a postoperative wound infection.  The gas does not track anterior to the vertebral bodies or spinal canal. Gas collection is relatively confined to the subcutaneous tissues. Nonobstructive right kidney stone. Chronic finger-like projection in the anterior abdomen possibly related to a ventral hernia and/or fat necrosis. Electronically Signed   By: Richarda Overlie M.D.   On: 07/17/2017 19:28    Chart has been reviewed    Assessment/Plan  a 61 y.o. female with medical history significant of L4-5 spondylolisthesis  Sp posterior lumbar interbody fusion and autograft by Dr. Jordan Likes on 07/03/2017, asthma, DM 2, hx of polysubstance abuse, HTN    Admitted for Sepsis due to possible postoperative wound infection  Present on Admission:  . Sepsis (HCC) most likely source being postoperative infection given large amount of gas present and significant pain with  palpation, cover broadly with vancomycin and Zosyn appreciate neurosurgical consult. Keep nothing by mouth for now for possible surgical intervention until has been evaluated by neurosurgery . Hyponatremia issue appears to be clinically hypovolemic will rehydrate and follow sodium obtain urine electrolytes  . Postoperative wound infection - ER provided discuss with neurosurgery who was seen in consult [facility], will  transfer keep nothing by mouth for now until evaluated by neurosurgery . AKI (acute kidney injury) (HCC) likely in the setting of dehydration obtain urine electrolytes . Dehydration rehydrate and follow . Acute encephalopathy -   - most likely multifactorial secondary to combination of  Infection mild dehydration secondary to decreased by mouth intake,  polypharmacy   - Will rehydrate   - treat underlining infection   - Hold contributing medications   - if no improvement may need further imaging to evaluate for CNS pathology  . Chronic hepatitis C without hepatic coma (HCC) - chronic check LFTs DM 2 -  - Order Sensitive  SSI   - continue home insulin regimen decreased to  30 units,  -  check TSH and HgA1C  - Hold by mouth medications    Other plan as per orders.  DVT prophylaxis:  SCD     Code Status:  FULL CODE  as per patient    Family Communication:   Family not at  Bedside   Disposition Plan:   likely will need placement for rehabilitation                              Consults called: Neuro surgery  Admission status:    inpatient       Level of care      SDU      I have spent a total of 56 min on this admission   Chabeli Barsamian 07/17/2017, 9:26 PM    [facility]  Pager [phone]   after 2 AM please page floor coverage PA If 7AM-7PM, please contact the day team taking care of the patient  [email or web address]  Password [email or web address]

## 2017-07-17 NOTE — ED Triage Notes (Signed)
Patient had back surgery on Sept 24th and patient been confused since. Patient not been eating since surgery and not eaten anything in 3 days.  When family went to give bath today noticed blood at surgical site.

## 2017-07-17 NOTE — ED Notes (Signed)
Patient using BR at time writer went to draw blood. Will re-attempt.

## 2017-07-17 NOTE — ED Provider Notes (Signed)
[facility] DEPT Provider Note   CSN: [id number] Arrival date & time: 07/17/17  1430     History   Chief Complaint Chief Complaint  Patient presents with  . Altered Mental Status  . bleeding surgical site  . not eating    HPI BRESLYN ABDO is a 61 y.o. female.  The history is provided by the patient, medical records and a relative. No language interpreter was used.  Illness  This is a new problem. The current episode started more than 2 days ago. The problem occurs constantly. The problem has been gradually worsening. Pertinent negatives include no chest pain, no abdominal pain, no headaches and no shortness of breath. Nothing aggravates the symptoms. Nothing relieves the symptoms. She has tried nothing for the symptoms. The treatment provided no relief.    Past Medical History:  Diagnosis Date  . Anemia   . Anxiety   . Arthritis   . Asthma   . Bunion   . Callus   . Chronic pain   . Cocaine abuse ([place])   . COPD (chronic obstructive pulmonary disease) ([place])   . Corns and callosities   . Degenerative joint disease   . Depression   . Diabetes mellitus   . Endometrial polyp   . ETOH abuse   . Gall stones   . GERD (gastroesophageal reflux disease)   . Headache    history of Migraines  . Hepatitis C    Hep C  . Hyperlipidemia   . Hypertension   . Insomnia   . Spondylolisthesis of lumbar region   . Substance abuse ([place])    alcoholism  . Tuberculosis 1985  . Wears dentures   . Wears glasses     Patient Active Problem List   Diagnosis Date Noted  . Degenerative spondylolisthesis 07/03/2017  . Post-menopausal bleeding 06/20/2017  . Chronic hepatitis C without hepatic coma ([place]) 12/09/2015  . Inclusion cyst 09/16/2014  . Onychomycosis 08/15/2014  . Type I (juvenile type) diabetes mellitus with renal manifestations, not stated as uncontrolled(250.41) 06/10/2014  . Unspecified constipation 05/22/2014  . Esophageal reflux 05/22/2014  . Pain in lower limb  01/29/2014  . Eschar of multiple sites 07/31/2013  . Porokeratosis 07/02/2013  . Cyst of joint of ankle or foot 06/04/2013  . History of endometrial ablation 02/06/2013  . Pain in joint, ankle and foot 01/23/2013  . Callus of foot 01/23/2013  . Ingrown nail 01/23/2013  . Deformity of metatarsal 01/23/2013    Past Surgical History:  Procedure Laterality Date  . CESAREAN SECTION     x3  . CHOLECYSTECTOMY N/A 08/11/2016   Procedure: LAPAROSCOPIC CHOLECYSTECTOMY WITH   INTRAOPERATIVE CHOLANGIOGRAM;  Surgeon: Stark Klein, MD;  Location: [facility];  Service: General;  Laterality: N/A;  . COLONOSCOPY    . Cotton Osteotomy w/ Graft Left 06/18/2009  . Excision of Benign Lesion Right 01/30/2013   Rt Plantar  . FOOT SURGERY    . HAMMER TOE REPAIR Right 02/12/2016   RIGHT #5  . Hammertoe Repair Left 06/18/2009   Lt #5  . HYSTEROSCOPY N/A 06/20/2017   Procedure: DILATION AND CURETTAGE, HYSTEROSCOPY w/ Polypectomy;  Surgeon: Lavonia Drafts, MD;  Location: [facility];  Service: Gynecology;  Laterality: N/A;  . METATARSAL OSTEOTOMY Left 06/18/2009   #5  . MULTIPLE TOOTH EXTRACTIONS    . Nail Matrixectomy Left 06/18/2009   LT #1  . OSTEOTOMY Right 01/30/2013   Rt #5  . Phalangectomy Left 06/18/2009   LT #1  . Phalangectomy Right  01/30/2013   Rt #1  . TUBAL LIGATION      OB History    Gravida Para Term Preterm AB Living   _0 SAB TAB Ectopic Multiple Live Births     1             Home Medications    Prior to Admission medications   Medication Sig Start Date End Date Taking? Authorizing Provider  albuterol (PROVENTIL HFA;VENTOLIN HFA) 108 (90 BASE) MCG/ACT inhaler Inhale 2 puffs into the lungs every 6 (six) hours as needed. For shortness of breath.   Yes [provider]  atorvastatin (LIPITOR) 10 MG tablet Take 10 mg by mouth daily.   Yes [provider]  buPROPion (WELLBUTRIN XL) 300 MG 24 hr tablet Take 300 mg by mouth every morning. 05/14/15  Yes [provider]  diazepam (VALIUM) 5 MG tablet Take 1-2 tablets (5-10 mg total) by mouth every 6 (six) hours as needed for muscle spasms. 07/04/17  Yes Pool, Mallie Mussel, MD  HYDROcodone-acetaminophen (NORCO) 10-325 MG tablet Take 1-2 tablets by mouth every 4 (four) hours as needed (breakthrough pain). 07/04/17  Yes Pool, Mallie Mussel, MD  LEVEMIR FLEXTOUCH 100 UNIT/ML Pen Inject 40 Units into the skin at bedtime.  07/20/16  Yes [provider]  lisinopril (PRINIVIL,ZESTRIL) 2.5 MG tablet Take 2.5 mg by mouth daily. 06/07/17  Yes [provider]  metFORMIN (GLUCOPHAGE) 500 MG tablet Take 500 mg by mouth 3 (three) times daily with meals.   Yes [provider]  Multiple Vitamin (MULTIVITAMIN WITH MINERALS) TABS tablet Take 1 tablet by mouth daily. Centrum Silver   Yes [provider]  NOVOLOG FLEXPEN 100 UNIT/ML FlexPen Inject 18 Units into the skin 3 (three) times daily as needed for high blood sugar.  07/20/16  Yes [provider]  omeprazole (PRILOSEC) 40 MG capsule Take 40 mg by mouth daily before breakfast.  07/19/16  Yes [provider]  prazosin (MINIPRESS) 5 MG capsule Take 10 mg by mouth at bedtime. 05/10/17  Yes [provider]  spironolactone (ALDACTONE) 50 MG tablet Take 50 mg by mouth daily.   Yes [provider]  traZODone (DESYREL) 150 MG tablet Take 150 mg by mouth at bedtime. 05/10/17  Yes [provider]  Blood Glucose Monitoring Suppl (ACCU-CHEK AVIVA PLUS) W/DEVICE KIT 1 Device by Does not apply route once. 06/10/14   Renato Shin, MD  glucose blood (ACCU-CHEK AVIVA) test strip 1 each by Other route 2 (two) times daily. And lancets 2/day 250.43 Patient taking differently: 1 each by Other route 3 (three) times daily. And lancets 2/day 250.43 06/10/14   Renato Shin, MD    Family History Family History  Problem Relation Age of Onset  . Heart disease Mother   . Diabetes Unknown        mat great aunt  . Cirrhosis Unknown          mat great aunt  . Cirrhosis Unknown        mat great uncles x 2  . Colon cancer Neg Hx   . Breast cancer Neg Hx     Social History Social History  Substance Use Topics  . Smoking status: Former Smoker    Packs/day: 0.50    Types: Cigarettes    Quit date: 04/10/2017  . Smokeless tobacco: Never Used  . Alcohol use No     Comment: in rehab     Allergies  Patient has no known allergies.   Review of Systems Review of Systems  Constitutional: Positive for chills, diaphoresis, fatigue and fever.  HENT: Negative for congestion.   Respiratory: Negative for cough, chest tightness, shortness of breath, wheezing and stridor.   Cardiovascular: Negative for chest pain and palpitations.  Gastrointestinal: Negative for abdominal pain, constipation, diarrhea and nausea.  Genitourinary: Negative for dysuria, flank pain and frequency.  Musculoskeletal: Positive for back pain. Negative for neck pain and neck stiffness.  Skin: Positive for wound.  Neurological: Positive for light-headedness. Negative for dizziness, weakness, numbness and headaches.  Psychiatric/Behavioral: Negative for agitation.     Physical Exam Updated Vital Signs BP 101/62 (BP Location: Left Arm)   Pulse (!) 108   Temp 98.8 F (37.1 C) (Oral)   Resp 16   SpO2 95%   Physical Exam  Constitutional: She is oriented to person, place, and time. She appears well-developed and well-nourished. No distress.  HENT:  Head: Normocephalic and atraumatic.  Nose: Nose normal.  Mouth/Throat: No oropharyngeal exudate.  Eyes: Pupils are equal, round, and reactive to light. Conjunctivae and EOM are normal.  Neck: Normal range of motion.  Cardiovascular: Normal rate and intact distal pulses.   No murmur heard. Pulmonary/Chest: Effort normal and breath sounds normal. No stridor. No respiratory distress. She has no wheezes. She exhibits no tenderness.  Abdominal: There is no tenderness.  Musculoskeletal: She exhibits  tenderness.       Lumbar back: She exhibits tenderness and pain.       Back:  Neurological: She is alert and oriented to person, place, and time. No cranial nerve deficit or sensory deficit. She exhibits normal muscle tone. GCS eye subscore is 4. GCS verbal subscore is 5. GCS motor subscore is 6.  No numbness or weakness in legs.   Skin: Skin is warm. Capillary refill takes less than 2 seconds. No rash noted. She is not diaphoretic. No erythema.  Psychiatric: She has a normal mood and affect.  Nursing note and vitals reviewed.    ED Treatments / Results  Labs (all labs ordered are listed, but only abnormal results are displayed) Labs Reviewed  CBC WITH DIFFERENTIAL/PLATELET - Abnormal; Notable for the following:       Result Value   WBC 11.2 (*)    RBC 3.82 (*)    Hemoglobin 9.5 (*)    HCT 28.8 (*)    MCV 75.4 (*)    MCH 24.9 (*)    Neutro Abs 8.4 (*)    All other components within normal limits  COMPREHENSIVE METABOLIC PANEL - Abnormal; Notable for the following:    Sodium 131 (*)    Chloride 98 (*)    CO2 21 (*)    Glucose, Bld 385 (*)    Creatinine, Ser 1.32 (*)    Albumin 3.4 (*)    Alkaline Phosphatase 130 (*)    GFR calc non Af Amer 43 (*)    GFR calc Af Amer 50 (*)    All other components within normal limits  URINALYSIS, ROUTINE W REFLEX MICROSCOPIC - Abnormal; Notable for the following:    Glucose, UA 50 (*)    All other components within normal limits  C-REACTIVE PROTEIN - Abnormal; Notable for the following:    CRP 20.1 (*)    All other components within normal limits  GLUCOSE, CAPILLARY - Abnormal; Notable for the following:    Glucose-Capillary 338 (*)    All other components within normal limits  I-STAT CG4 LACTIC  ACID, ED - Abnormal; Notable for the following:    Lactic Acid, Venous 3.06 (*)    All other components within normal limits  URINE CULTURE  CULTURE, BLOOD (ROUTINE X 2)  CULTURE, BLOOD (ROUTINE X 2)  LIPASE, BLOOD  SEDIMENTATION RATE    LACTIC ACID, PLASMA  LACTIC ACID, PLASMA  PROCALCITONIN  PROTIME-INR  APTT  HEMOGLOBIN A1C  RAPID URINE DRUG SCREEN, HOSP PERFORMED  SODIUM, URINE, RANDOM  OSMOLALITY, URINE  CREATININE, URINE, RANDOM  MAGNESIUM  PHOSPHORUS  TSH  COMPREHENSIVE METABOLIC PANEL  CBC  HIV ANTIBODY (ROUTINE TESTING)  I-STAT CG4 LACTIC ACID, ED    EKG  EKG Interpretation None       Radiology Dg Chest 2 View  Result Date: 07/17/2017 CLINICAL DATA:  Lower abdominal pain and back pain. Problems since back surgery in September. EXAM: CHEST  2 VIEW COMPARISON:  06/18/2015 FINDINGS: Soft tissue fullness in the paratracheal region is similar to the previous examination and probably related to vascular structures. There is a chronic density or calcification in the lateral left lower lung. Heart size is within normal limits and stable. No focal airspace disease or pulmonary edema. Degenerative endplate changes in lower thoracic spine. No large pleural effusions. IMPRESSION: No active cardiopulmonary disease. Electronically Signed   By: Markus Daft M.D.   On: 07/17/2017 18:05   Ct Abdomen Pelvis W Contrast  Result Date: 07/17/2017 CLINICAL DATA:  Back surgery in September 24th. Patient has not been eating well. EXAM: CT ABDOMEN AND PELVIS WITH CONTRAST TECHNIQUE: Multidetector CT imaging of the abdomen and pelvis was performed using the standard protocol following bolus administration of intravenous contrast. CONTRAST:  134m ISOVUE-300 IOPAMIDOL (ISOVUE-300) INJECTION 61% COMPARISON:  05/21/2011 FINDINGS: Lower chest: Lung bases are clear. Hepatobiliary: Gallbladder has been removed. Portal venous system is patent. No suspicious liver lesions. Pancreas: Normal appearance of the pancreas without inflammation or duct dilatation. Spleen: Normal appearance of spleen without enlargement. Adrenals/Urinary Tract: Normal adrenal glands. Tiny stone in the right kidney lower pole without hydronephrosis. Mild distention of  the urinary bladder. Normal appearance the left kidney without hydronephrosis. Stomach/Bowel: Small hiatal hernia. No bowel dilatation or obstruction. No acute inflammatory changes in the bowel. Vascular/Lymphatic: Mild atherosclerotic disease in the aorta and iliac vessels without aneurysm. IVC and iliac veins are patent. No significant lymph node enlargement in the abdomen or pelvis. Reproductive: Uterus and bilateral adnexa are unremarkable. Other: There is an air-fluid collection in the subcutaneous tissues posterior to the lumbar spine and surgical level. This collection measures 11 x 4.4 x 7.1 cm. Air is the largest component to this collection. There is subcutaneous edema surrounding this collection. This collection does not extend into the paraspinal musculature. Again noted is a finger-like projection of tissue along the anterior abdominal cavity above the umbilicus. This could be associated with a ventral hernia. There is a new small nodular component just cephalad to the chronic larger finger-like projection. This could be associated with fat necrosis. No other suspicious abdominal nodules. There is an umbilical hernia containing fat. Musculoskeletal: Bilateral pedicle screw and rod fixation at L5-S1 with interbody device. Surgical hardware is intact. Again noted is a large Schmorl's node along the superior endplate of TY30 IMPRESSION: Large gas-filled collection in the subcutaneous tissues posterior to the lumbar spine surgery site. This collection measures over 11 cm. Findings are concerning for a postoperative wound infection. The gas does not track anterior to the vertebral bodies or spinal canal. Gas collection is relatively confined to the  subcutaneous tissues. Nonobstructive right kidney stone. Chronic finger-like projection in the anterior abdomen possibly related to a ventral hernia and/or fat necrosis. Electronically Signed   By: Markus Daft M.D.   On: 07/17/2017 19:28     Procedures Procedures (including critical care time)  CRITICAL CARE Performed by: Gwenyth Allegra Tegeler Total critical care time: 35 minutes Critical care time was exclusive of separately billable procedures and treating other patients. Sepsis with hypotension.  Critical care was necessary to treat or prevent imminent or life-threatening deterioration. Critical care was time spent personally by me on the following activities: development of treatment plan with patient and/or surrogate as well as nursing, discussions with consultants, evaluation of patient's response to treatment, examination of patient, obtaining history from patient or surrogate, ordering and performing treatments and interventions, ordering and review of laboratory studies, ordering and review of radiographic studies, pulse oximetry and re-evaluation of patient's condition.   Medications Ordered in ED Medications  iopamidol (ISOVUE-300) 61 % injection (not administered)  insulin aspart (novoLOG) injection 0-9 Units (not administered)  polyethylene glycol (MIRALAX / GLYCOLAX) packet 17 g (17 g Oral Not Given 07/17/17 2115)  piperacillin-tazobactam (ZOSYN) IVPB 3.375 g (not administered)  vancomycin (VANCOCIN) IVPB 750 mg/150 ml premix (not administered)  atorvastatin (LIPITOR) tablet 10 mg (not administered)  traZODone (DESYREL) tablet 150 mg (not administered)  insulin detemir (LEVEMIR) injection 30 Units (not administered)  buPROPion (WELLBUTRIN XL) 24 hr tablet 300 mg (not administered)  acetaminophen (TYLENOL) tablet 650 mg (not administered)    Or  acetaminophen (TYLENOL) suppository 650 mg (not administered)  HYDROcodone-acetaminophen (NORCO/VICODIN) 5-325 MG per tablet 1-2 tablet (not administered)  ondansetron (ZOFRAN) tablet 4 mg (not administered)    Or  ondansetron (ZOFRAN) injection 4 mg (not administered)  0.9 %  sodium chloride infusion (not administered)  senna (SENOKOT) tablet 8.6 mg (not  administered)  polyethylene glycol (MIRALAX / GLYCOLAX) packet 17 g (not administered)  bisacodyl (DULCOLAX) suppository 10 mg (not administered)  albuterol (PROVENTIL) (2.5 MG/3ML) 0.083% nebulizer solution 2.5 mg (not administered)  sodium chloride 0.9 % bolus 1,000 mL (1,000 mLs Intravenous New Bag/Given 07/17/17 1811)  sodium chloride 0.9 % bolus 1,000 mL (0 mLs Intravenous Stopped 07/17/17 2041)  iopamidol (ISOVUE-300) 61 % injection 100 mL (100 mLs Intravenous Contrast Given 07/17/17 1856)  sodium chloride 0.9 % bolus 1,000 mL (0 mLs Intravenous Stopped 07/17/17 2133)    And  sodium chloride 0.9 % bolus 1,000 mL (0 mLs Intravenous Stopped 07/17/17 2244)  piperacillin-tazobactam (ZOSYN) IVPB 3.375 g (0 g Intravenous Stopped 07/17/17 2103)  vancomycin (VANCOCIN) IVPB 1000 mg/200 mL premix (0 mg Intravenous Stopping Infusion hung by another clincian 07/17/17 2314)     Initial Impression / Assessment and Plan / ED Course  I have reviewed the triage vital signs and the nursing notes.  Pertinent labs & imaging results that were available during my care of the patient were reviewed by me and considered in my medical decision making (see chart for details).     MYKAILA BLUNCK is a 61 y.o. female with a past medical history significant for diabetes, hyperlipidemia, asthma, COPD, hepatitis C, depression, GERD, and recent back surgery 2 weeks ago who presents with confusion, decreased oral intake, chills, worsening back pain, wound drainage, sleepiness, and fatigue. Patient is committed by family reports they called her PCP and were instructed to come to the ED to look for infection. Patient says that she has had back pain worsening since her back surgery. She reports as  a sharp and aching pain in her low back. Family reports that while getting her breath today, the bottom of the wound began draining a yellow and bloody discharge. This is new. Patient has tenderness surrounding the wound. Patient also  reports some constipation but denies diarrhea. She denies any urinary symptoms. She reports fatigue and chills but no fevers. She denies chest pain, palpitations, or shortness of breath. She denies abdominal pain. Family reports that she has had decreased oral intake since her surgery and has not eaten anything in the last 3 days. Patient is acting fatigued and is very sleepy. Patient describes her pain as a 7 out of 10 severity currently. Family reports the patient is acting confused and repeats the same questions frequently. This is a new problem for her.  On exam, patient is tenderness in the low back. Patient's wound is tender in the lower area. There is a small area of drainage. Drainage was not foul-smelling but did appear to be a yellow color. No significant bleeding seen on my exam. Patient's abdomen was nontender. Patient had normal sensation and strength and lower extremity. Lungs were clear chest is nontender. Patient is somnolent but arousable. Patient had no focal neurologic deficits on my exam. Patient was disoriented to month but knew her name and location.  Based on symptoms, occult infection and dehydration may be present. Patient will be fluids. Patient workup to look for pneumonia, UTI, or infection of the surgical site. Patient will have CT imaging to look for infection. Anticipate reassessment following workup.  8:18 PM Diagnostic laboratory testing showed elevated lactic 3.06. Rectal temperature was nonelevated. Leukocytosis present with what little, 11.2. Mild anemia. CMP showed elevation in creatinine to 1.32. Lipase not elevated. Chest x-ray revealed no evidence of pneumonia. rinalysis in process.  CT scan to look for postoperative infection revealed a large, 11 cm collection of gas filledin the area of the lumbar spine surgical site. This is concerning for postoperative wound infection according to radiology.  In light of this CT imaging, lactic acidosis, leukocytosis, worsened  back pain in the surgical site, yellow drainage, and hypotension, patient will be made a code sepsis and treated for presumed wound infection.   Neurosurgery was called. They looked at the imaging and were not convinced she has a wound infection. They recommended patient be admitted to the hospital service and transferred to [facility] and they will evaluate the patient as well.  Hospitalist team will be called for admission.   Final Clinical Impressions(s) / ED Diagnoses   Final diagnoses:  Sepsis, due to unspecified organism ([place])  Acute midline low back pain without sciatica  Lactic acidosis    Clinical Impression: 1. Sepsis, due to unspecified organism ([place])   2. Acute midline low back pain without sciatica   3. Lactic acidosis       Disposition: Admit to Hospitalist service    Tegeler, Gwenyth Allegra, MD 07/18/17 1556

## 2017-07-17 NOTE — ED Notes (Signed)
Nurse Tech for RM 1 notified writer that they would collect blood from patient.

## 2017-07-17 NOTE — ED Notes (Signed)
Patient aware we need urine, getting IV fluids at this time. Patient and family aware to call when she needs to urinate

## 2017-07-17 NOTE — ED Notes (Signed)
Notified Teegler, MD of I-stat Lactic result of 3.06 at 1732.

## 2017-07-17 NOTE — Progress Notes (Signed)
A consult was received from an ED physician for Vancomycin and Zosyn per pharmacy dosing.  The patient's profile has been reviewed for ht/wt/allergies/indication/available labs.    A one time order has already been placed by MD for Vancomycin 1g IV and Zosyn 3.375g (30 minute infusion).  Further antibiotics/pharmacy consults should be ordered by admitting physician if indicated.                       Thank you, Jamse Mead 07/17/2017  7:45 PM

## 2017-07-17 NOTE — ED Notes (Signed)
Notified Tegeler, MD of I-Stat Lactic results of 3.06 at 1732.

## 2017-07-17 NOTE — Progress Notes (Signed)
Pharmacy Antibiotic Note  Kara Kelley is a 61 y.o. female admitted on 07/17/2017 with sepsis due to possible post-operative wound infection.  Pharmacy has been consulted for Vancomycin and Zosyn dosing.  Plan:  Vancomycin 1g IV x 1 ordered in the ED. Continue with Vancomycin  IV q12h.   Plan for Vancomycin trough level at steady state.  Zosyn 3.375g IV x 1 over 30 minutes ordered in the ED. Continue with Zosyn 3.375g IV q8h (infuse over 4 hours).   Daily SCr while on above regimen.   Monitor renal function, cultures, clinical course.   Height: 5' (152.4 cm) Weight: 196 lb (88.9 kg) IBW/kg (Calculated) : 45.5  Temp (24hrs), Avg:98.7 F (37.1 C), Min:98.6 F (37 C), Max:98.8 F (37.1 C)   Recent Labs Lab 07/17/17 1634 07/17/17 1731 07/17/17 2048  WBC 11.2*  --   --   CREATININE 1.32*  --   --   LATICACIDVEN  --  3.06* 1.46    Estimated Creatinine Clearance: 45.6 mL/min (A) (by C-G formula based on SCr of 1.32 mg/dL (H)).    No Known Allergies  Antimicrobials this admission: 10/8 >> Vancomycin >> 10/8 >> Zosyn >>  Dose adjustments this admission: --  Microbiology results: 10/8 BCx: sent 10/8 UCx: sent    Thank you for allowing pharmacy to be a part of this patient's care.   Greer Pickerel, PharmD, BCPS Pager: [phone] 07/17/2017 9:17 PM

## 2017-07-18 DIAGNOSIS — R6510 Systemic inflammatory response syndrome (SIRS) of non-infectious origin without acute organ dysfunction: Secondary | ICD-10-CM

## 2017-07-18 LAB — COMPREHENSIVE METABOLIC PANEL
ALK PHOS: 104 U/L (ref 38–126)
ALT: 16 U/L (ref 14–54)
AST: 18 U/L (ref 15–41)
Albumin: 2.8 g/dL — ABNORMAL LOW (ref 3.5–5.0)
Anion gap: 8 (ref 5–15)
BILIRUBIN TOTAL: 0.5 mg/dL (ref 0.3–1.2)
BUN: 6 mg/dL (ref 6–20)
CALCIUM: 8.2 mg/dL — AB (ref 8.9–10.3)
CO2: 20 mmol/L — ABNORMAL LOW (ref 22–32)
CREATININE: 1 mg/dL (ref 0.44–1.00)
Chloride: 107 mmol/L (ref 101–111)
GFR calc Af Amer: >60 mL/min (ref >60)
Glucose, Bld: 327 mg/dL — ABNORMAL HIGH (ref 65–99)
POTASSIUM: 3.8 mmol/L (ref 3.5–5.1)
Sodium: 135 mmol/L (ref 135–145)
TOTAL PROTEIN: 6.6 g/dL (ref 6.5–8.1)

## 2017-07-18 LAB — GLUCOSE, CAPILLARY
GLUCOSE-CAPILLARY: 338 mg/dL — AB (ref 65–99)
Glucose-Capillary: 157 mg/dL — ABNORMAL HIGH (ref 65–99)
Glucose-Capillary: 108 mg/dL — ABNORMAL HIGH (ref 65–99)
Glucose-Capillary: 214 mg/dL — ABNORMAL HIGH (ref 65–99)
Glucose-Capillary: 283 mg/dL — ABNORMAL HIGH (ref 65–99)
Glucose-Capillary: 182 mg/dL — ABNORMAL HIGH (ref 65–99)

## 2017-07-18 LAB — RAPID URINE DRUG SCREEN, HOSP PERFORMED
Amphetamines: NOT DETECTED
BARBITURATES: NOT DETECTED
BENZODIAZEPINES: POSITIVE — AB
Cocaine: NOT DETECTED
Opiates: POSITIVE — AB
Tetrahydrocannabinol: NOT DETECTED

## 2017-07-18 LAB — PROTIME-INR
INR: 1.1
Prothrombin Time: 14.1 seconds (ref 11.4–15.2)

## 2017-07-18 LAB — SEDIMENTATION RATE: SED RATE: 71 mm/h — AB (ref 0–22)

## 2017-07-18 LAB — CBC
HEMATOCRIT: 25.2 % — AB (ref 36.0–46.0)
Hemoglobin: 8.3 g/dL — ABNORMAL LOW (ref 12.0–15.0)
MCH: 24.9 pg — ABNORMAL LOW (ref 26.0–34.0)
MCHC: 32.9 g/dL (ref 30.0–36.0)
MCV: 75.4 fL — ABNORMAL LOW (ref 78.0–100.0)
PLATELETS: 154 10*3/uL (ref 150–400)
RBC: 3.34 MIL/uL — ABNORMAL LOW (ref 3.87–5.11)
RDW: 13.9 % (ref 11.5–15.5)
WBC: 8.2 10*3/uL (ref 4.0–10.5)

## 2017-07-18 LAB — HEMOGLOBIN A1C
Hgb A1c MFr Bld: 7.6 % — ABNORMAL HIGH (ref 4.8–5.6)
MEAN PLASMA GLUCOSE: 171.42 mg/dL

## 2017-07-18 LAB — HIV ANTIBODY (ROUTINE TESTING W REFLEX): HIV SCREEN 4TH GENERATION: NONREACTIVE

## 2017-07-18 LAB — CREATININE, URINE, RANDOM: CREATININE, URINE: 65.11 mg/dL

## 2017-07-18 LAB — LACTIC ACID, PLASMA
Lactic Acid, Venous: 2.5 mmol/L (ref 0.5–1.9)
Lactic Acid, Venous: 1.4 mmol/L (ref 0.5–1.9)

## 2017-07-18 LAB — PROCALCITONIN: Procalcitonin: 0.24 ng/mL

## 2017-07-18 LAB — SODIUM, URINE, RANDOM: SODIUM UR: 19 mmol/L

## 2017-07-18 LAB — MAGNESIUM: Magnesium: 1.6 mg/dL — ABNORMAL LOW (ref 1.7–2.4)

## 2017-07-18 LAB — PHOSPHORUS: PHOSPHORUS: 2.5 mg/dL (ref 2.5–4.6)

## 2017-07-18 LAB — APTT: aPTT: 28 seconds (ref 24–36)

## 2017-07-18 LAB — TSH: TSH: 1.438 u[IU]/mL (ref 0.350–4.500)

## 2017-07-18 LAB — OSMOLALITY, URINE: OSMOLALITY UR: 191 mosm/kg — AB (ref 300–900)

## 2017-07-18 LAB — C-REACTIVE PROTEIN: CRP: 20.1 mg/dL — ABNORMAL HIGH (ref <1.0)

## 2017-07-18 MED ORDER — INSULIN ASPART 100 UNIT/ML ~~LOC~~ SOLN
0.0000 [IU] | Freq: Three times a day (TID) | SUBCUTANEOUS | Status: DC
  Administered 2017-07-18: 8 [IU] via SUBCUTANEOUS
  Administered 2017-07-19: 11 [IU] via SUBCUTANEOUS
  Administered 2017-07-19 – 2017-07-20 (×4): 3 [IU] via SUBCUTANEOUS

## 2017-07-18 MED ORDER — TRAZODONE HCL 50 MG PO TABS
100.0000 mg | ORAL_TABLET | Freq: Every day | ORAL | Status: DC
  Administered 2017-07-18: 100 mg via ORAL
  Filled 2017-07-18: qty 2

## 2017-07-18 MED ORDER — ADULT MULTIVITAMIN W/MINERALS CH
1.0000 | ORAL_TABLET | Freq: Every day | ORAL | Status: DC
  Administered 2017-07-18 – 2017-07-20 (×3): 1 via ORAL
  Filled 2017-07-18 (×3): qty 1

## 2017-07-18 MED ORDER — ACETAMINOPHEN 325 MG PO TABS: 650.0000 mg | ORAL_TABLET | Freq: Four times a day (QID) | ORAL | Status: DC | PRN

## 2017-07-18 MED ORDER — MAGNESIUM SULFATE 2 GM/50ML IV SOLN
2.0000 g | Freq: Once | INTRAVENOUS | Status: AC
  Administered 2017-07-18: 2 g via INTRAVENOUS
  Filled 2017-07-18: qty 50

## 2017-07-18 MED ORDER — DOXYCYCLINE HYCLATE 100 MG PO TABS
100.0000 mg | ORAL_TABLET | Freq: Two times a day (BID) | ORAL | Status: DC
  Filled 2017-07-18 (×2): qty 1

## 2017-07-18 MED ORDER — DOXYCYCLINE HYCLATE 100 MG PO TABS
100.0000 mg | ORAL_TABLET | Freq: Two times a day (BID) | ORAL | Status: DC
  Administered 2017-07-18 – 2017-07-20 (×5): 100 mg via ORAL
  Filled 2017-07-18 (×4): qty 1

## 2017-07-18 MED ORDER — INSULIN ASPART 100 UNIT/ML ~~LOC~~ SOLN: 0.0000 [IU] | Freq: Every day | SUBCUTANEOUS | Status: DC

## 2017-07-18 MED ORDER — ENOXAPARIN SODIUM 40 MG/0.4ML ~~LOC~~ SOLN
40.0000 mg | SUBCUTANEOUS | Status: DC
  Administered 2017-07-18: 40 mg via SUBCUTANEOUS
  Filled 2017-07-18: qty 0.4

## 2017-07-18 MED ORDER — SODIUM CHLORIDE 0.9 % IV SOLN
INTRAVENOUS | Status: DC
  Administered 2017-07-18 – 2017-07-19 (×3): via INTRAVENOUS

## 2017-07-18 MED ORDER — SODIUM CHLORIDE 0.9 % IV BOLUS (SEPSIS)
500.0000 mL | Freq: Once | INTRAVENOUS | Status: AC
  Administered 2017-07-18: 500 mL via INTRAVENOUS

## 2017-07-18 MED ORDER — ACETAMINOPHEN 650 MG RE SUPP: 650.0000 mg | Freq: Four times a day (QID) | RECTAL | Status: DC | PRN

## 2017-07-18 NOTE — Evaluation (Signed)
Physical Therapy Evaluation Patient Details Name: Kara Kelley MRN: [id number] DOB: [birth year] Today's Date: 07/18/2017   History of Present Illness  Pt is a 61 yo female admitted with serosanguinous discharge from surgical site and altered mental status s/p PLIF at L4-L5 9/24. PMH: anxiety, ETOH abuse, DM, GERD, migraines, COPD, cocaine abuse, hep C, HTN PSH: mulitple foot surgeries, osteotomy, chole.  Clinical Impression  Pt admitted with above diagnosis. Pt currently with functional limitations due to the deficits listed below (see PT Problem List). Pt limited by pain and weakness in her R LE with weightbearing. Pt currently, supervision with bed mobility and transfers and min guard for ambulation of 60 feet with RW. Pt will benefit from skilled PT to increase their independence and safety with mobility to allow discharge to the venue listed below.       Follow Up Recommendations Home health PT;Supervision/Assistance - 24 hour    Equipment Recommendations  Rolling walker with 5" wheels    Recommendations for Other Services       Precautions / Restrictions Precautions Precautions: Back Restrictions Weight Bearing Restrictions: No      Mobility  Bed Mobility Overal bed mobility: Needs Assistance     Sidelying to sit: Supervision       General bed mobility comments: supervision for safety, able to roll to side and push up with HoB elevated  Transfers Overall transfer level: Needs assistance Equipment used: Rolling walker (2 wheeled) Transfers: Sit to/from Stand Sit to Stand: Supervision         General transfer comment: supervision for safety, good powerup and steadying before reaching to RW  Ambulation/Gait Ambulation/Gait assistance: Min guard Ambulation Distance (Feet): 60 Feet Assistive device: Rolling walker (2 wheeled) Gait Pattern/deviations: Step-to pattern;Decreased stance time - right;Decreased step length - left;Decreased weight shift to  right;Shuffle;Antalgic Gait velocity: slowed Gait velocity interpretation: Below normal speed for age/gender General Gait Details: hands on min guard for safety, slow, steady cadence with decreasing weightbearing through R LE as pain increased with gait        Balance   Sitting-balance support: No upper extremity supported;Feet supported Sitting balance-Leahy Scale: Good     Standing balance support: No upper extremity supported Standing balance-Leahy Scale: Fair                               Pertinent Vitals/Pain Pain Assessment: 0-10 Pain Score: 7  Pain Location: lower back down right side into leg Pain Descriptors / Indicators: Aching;Sore Pain Intervention(s): Premedicated before session;Monitored during session;Limited activity within patient's tolerance    Home Living Family/patient expects to be discharged to:: Private residence Living Arrangements: Other relatives Available Help at Discharge: Family;Available 24 hours/day Type of Home: House Home Access: Stairs to enter Entrance Stairs-Rails: Can reach both Entrance Stairs-Number of Steps: 4 Home Layout: One level Home Equipment: Bedside commode;Cane - single point      Prior Function Level of Independence: Needs assistance   Gait / Transfers Assistance Needed: ambulates with cane  ADL's / Homemaking Assistance Needed: assist with bathing and dressing since spinal surgery           Extremity/Trunk Assessment   Upper Extremity Assessment Upper Extremity Assessment: Overall WFL for tasks assessed    Lower Extremity Assessment Lower Extremity Assessment: RLE deficits/detail RLE Deficits / Details: R LE ROM WFL, hip and knee flexion grossly 3-/5, knee extension, and ankle plantar/dorsiflexion grossly 3/5 RLE Sensation:  (intact)  Cervical / Trunk Assessment Cervical / Trunk Assessment: Other exceptions (s/p lumbar surgery)  Communication   Communication: No difficulties  Cognition  Arousal/Alertness: Awake/alert Behavior During Therapy: WFL for tasks assessed/performed Overall Cognitive Status: Within Functional Limits for tasks assessed                                        General Comments General comments (skin integrity, edema, etc.): VSS         Assessment/Plan    PT Assessment Patient needs continued PT services  PT Problem List Decreased strength;Decreased activity tolerance;Decreased mobility;Pain       PT Treatment Interventions DME instruction;Gait training;Stair training;Functional mobility training;Therapeutic activities;Therapeutic exercise;Balance training;Patient/family education    PT Goals (Current goals can be found in the Care Plan section)  Acute Rehab PT Goals Patient Stated Goal: feel better and move around without pain PT Goal Formulation: With patient Time For Goal Achievement: 08/01/17 Potential to Achieve Goals: Good    Frequency Min 3X/week    AM-PAC PT "6 Clicks" Daily Activity  Outcome Measure Difficulty turning over in bed (including adjusting bedclothes, sheets and blankets)?: A Little Difficulty moving from lying on back to sitting on the side of the bed? : A Little Difficulty sitting down on and standing up from a chair with arms (e.g., wheelchair, bedside commode, etc,.)?: A Little Help needed moving to and from a bed to chair (including a wheelchair)?: A Little Help needed walking in hospital room?: A Little Help needed climbing 3-5 steps with a railing? : A Lot 6 Click Score: 17    End of Session Equipment Utilized During Treatment: Gait belt Activity Tolerance: Patient limited by pain Patient left: in chair;with call bell/phone within reach Nurse Communication: Mobility status PT Visit Diagnosis: Unsteadiness on feet (R26.81);Other abnormalities of gait and mobility (R26.89);Muscle weakness (generalized) (M62.81);Difficulty in walking, not elsewhere classified (R26.2);Pain Pain - Right/Left:  Right Pain - part of body: Leg (back)    Time: 1610-9604 PT Time Calculation (min) (ACUTE ONLY): 22 min   Charges:   PT Evaluation $PT Eval Low Complexity: 1 Low     PT G Codes:        Calogero Geisen B. Beverely Risen PT, DPT Acute Rehabilitation  [phone] Pager [phone]    Elon Alas Fleet 07/18/2017, 4:03 PM

## 2017-07-18 NOTE — Progress Notes (Signed)
Patient arrived on the unit and is resting comfortably. I paged Dr. Lovell Sheehan with neurosurgery per order. He responded with " Who asked you to page me" and I told him I would look that up for him. He then responded " I guess that's irrelevant at this point, okay bye". I will continue to monitor the patient.

## 2017-07-18 NOTE — Progress Notes (Signed)
CRITICAL VALUE ALERT  Critical Value:  Lactic acid 2.5  Date & Time Notied: 07/18/17 0416  Provider Notified: K schoor  Orders Received/Actions taken: awaiting

## 2017-07-18 NOTE — Progress Notes (Signed)
Patient admitted with failure to thrive. She is now proximally 2 weeks status postL4-L5 decompression and fusion. Patient with complaints of back pain. No lower extremity pain. No new symptoms of numbness or weakness. No history of fever..patient recently with some serous drainage from the inferior aspect of her wound.  I reviewed the CT scan of her lumbar spine. This demonstrates some air within her superficial wound compartment consistent with drainage of a postoperative seroma. Nothing to suggest deep wound space involvement.  Wound with some minor Inferior dehiscence.No active drainage. No erythema No fluctuance. Straight leg raising negative bilaterally. Motor and sensory function intact.  No current evidence of active wound infection. Patient with some inferior wound dehiscence which should be treated with dry dressing changes.  I would discharge the patient home on prophylactic doxycycline for 2 weeks.  FU with me in one week.  OK for DC home.

## 2017-07-18 NOTE — Progress Notes (Signed)
TEAM 1 - Stepdown/ICU TEAM  GRADY LUCCI  YQM:[id number] DOB: Dec 11, 1955 DOA: 07/17/2017 PCP: Dorothyann Peng, MD    Brief Narrative:  61 y.o.femalewith a history of L4-5 spondylolisthesis s/p posterior lumbar interbody fusion and autograft by Dr. Jordan Likes on 07/03/2017, asthma, DM2, polysubstance abuse, and HTN who presented with poor appetite and confusion. She reportedly had not eaten for 3 days. Family gave her a bath and noted drainage from her surgical sight, though she had no localizing sx.  She has a chronic right leg weakness that was unchanged from prior to lumbar fusion.   Subjective: The patient reports that she feels much better.  She is alert and oriented and back to her baseline mental status.  She denies n/v, abdom pain, sob, or back pain.  She is tolerating her lunch at present.  She has not yet been up / ambulated.    Assessment & Plan:  SIRS - lactic acidosis  Corrected with simple volume resuscitation - no evidence of active infection - will dose w/ empiric abx at suggestion of NS but o/w no objective sign of sepsis - suspect this was simple but profound volume depletion due to extremely limited intake over 3+ days    Acute encephalopathy - delirium Appears to have been due primarily to severe DH and hyponatremia - rapidly resolved - follow 24hrs more to assure remains stable   Hyponatremia - Dehydration  Corrected w/ simple volume resuscitation   Recent Labs Lab 07/17/17 1634 07/18/17 0216  NA 131* 135    Hypomagnesemia Due to limited intake - supplement and follow  Acute kidney injury  Most c/w pre-renal azotemia - improving w/ volume expansion  Recent Labs Lab 07/17/17 1634 07/18/17 0216  CREATININE 1.32* 1.00    DM2 A1c 7.6 - CBG climbing w/ resumption of diet - adjust tx and follow   Chronic hepatitis C  DVT prophylaxis: lovenox  Code Status: FULL CODE Family Communication: spoke w/ 2 daughters at bedside   Disposition Plan:  possible d/c in 24-48hrs  Consultants:  NS  Procedures: none  Antimicrobials:  Zosyn 10/8 Vanc 10/8 > 10/9 Doxy 10/9 >  Objective: Blood pressure 104/62, pulse 84, temperature 97.7 F (36.5 C), temperature source Oral, resp. rate (!) 24, height 5' (1.524 m), weight 91.7 kg (202 lb 1.6 oz), SpO2 93 %.  Intake/Output Summary (Last 24 hours) at 07/18/17 1345 Last data filed at 07/18/17 1154  Gross per 24 hour  Intake             2500 ml  Output              950 ml  Net             1550 ml   Filed Weights   07/17/17 2020 07/17/17 2315  Weight: 88.9 kg (196 lb) 91.7 kg (202 lb 1.6 oz)    Examination: General: No acute respiratory distress Lungs: Clear to auscultation bilaterally without wheezes or crackles Cardiovascular: Regular rate and rhythm without murmur gallop or rub normal S1 and S2 Abdomen: Nontender, nondistended, soft, bowel sounds positive, no rebound, no ascites, no appreciable mass Extremities: No significant cyanosis, clubbing, or edema bilateral lower extremities  CBC:  Recent Labs Lab 07/17/17 1634 07/18/17 0216  WBC 11.2* 8.2  NEUTROABS 8.4*  --   HGB 9.5* 8.3*  HCT 28.8* 25.2*  MCV 75.4* 75.4*  PLT 195 154   Basic Metabolic Panel:  Recent Labs Lab 07/17/17 1634 07/18/17 0216  NA 131*  135  K 4.1 3.8  CL 98* 107  CO2 21* 20*  GLUCOSE 385* 327*  BUN 11 6  CREATININE 1.32* 1.00  CALCIUM 9.0 8.2*  MG  --  1.6*  PHOS  --  2.5   GFR: Estimated Creatinine Clearance: 61.2 mL/min (by C-G formula based on SCr of 1 mg/dL).  Liver Function Tests:  Recent Labs Lab 07/17/17 1634 07/18/17 0216  AST 19 18  ALT 21 16  ALKPHOS 130* 104  BILITOT 0.5 0.5  PROT 8.0 6.6  ALBUMIN 3.4* 2.8*    Recent Labs Lab 07/17/17 1634  LIPASE 22    Coagulation Profile:  Recent Labs Lab 07/18/17 0216  INR 1.10    HbA1C: Hgb A1c MFr Bld  Date/Time Value Ref Range Status  07/18/2017 02:16 AM 7.6 (H) 4.8 - 5.6 % Final    Comment:     (NOTE) Pre diabetes:          5.7%-6.4% Diabetes:              >6.4% Glycemic control for   <7.0% adults with diabetes   07/03/2017 06:46 AM 7.1 (H) 4.8 - 5.6 % Final    Comment:    (NOTE) Pre diabetes:          5.7%-6.4% Diabetes:              >6.4% Glycemic control for   <7.0% adults with diabetes     CBG:  Recent Labs Lab 07/18/17 0117 07/18/17 0521 07/18/17 0803 07/18/17 1152  GLUCAP 338* 157* 108* 214*     Scheduled Meds: . atorvastatin  10 mg Oral Daily  . buPROPion  300 mg Oral q morning - 10a  . doxycycline  100 mg Oral Q12H  . insulin aspart  0-9 Units Subcutaneous Q4H  . insulin detemir  30 Units Subcutaneous QHS  . multivitamin with minerals  1 tablet Oral Daily  . polyethylene glycol  17 g Oral Daily  . senna  1 tablet Oral BID  . traZODone  150 mg Oral QHS     LOS: 1 day   Lonia Blood, MD [facility] Office  [phone] Pager - Text Page per Amion as per below:  On-Call/Text Page:      [email or web address]      password [email or web address]  If 7PM-7AM, please contact night-coverage [email or web address] Password [email or web address] 07/18/2017, 1:45 PM

## 2017-07-18 NOTE — Progress Notes (Addendum)
Initial Nutrition Assessment  DOCUMENTATION CODES:   Obesity unspecified  INTERVENTION:   -MVI daily  NUTRITION DIAGNOSIS:   Increased nutrient needs related to wound healing as evidenced by estimated needs.  GOAL:   Patient will meet greater than or equal to 90% of their needs  MONITOR:   PO intake, Supplement acceptance, Labs, Weight trends, Skin, I & O's  REASON FOR ASSESSMENT:   Consult  (malnutrition)  ASSESSMENT:   61 y.o. female with medical history significant of L4-5 spondylolisthesis  Sp posterior lumbar interbody fusion and autograft by Dr. Jordan Likes on 07/03/2017, asthma, DM 2, hx of polysubstance abuse, HTN. Admitted with sepsis due to possible post-op wound infection.   Pt sleepy at time of visit and did not engage much with this RD during visit. She reports very poor intake over the past 3-4 days due to not feeling well (states "I haven't eaten anything'). Prior to this, she reports having a very hearty appetite. Her largest complaint during visit is that she is hungry because she hasn't eaten. Noted diet was just modified to carb modified and reports her daughter is coming to visit her and bring her food.   Pt denies any weight loss. Noted wt stability over the past year.   Nutrition-Focused physical exam completed. Findings are no fat depletion, no muscle depletion, and mild edema.   Albumin has a half-life of 21 days and is strongly affected by stress response and inflammatory process, therefore, do not expect to see an improvement in this lab value during acute hospitalization. When a patient presents with low albumin, it is likely skewed due to the acute inflammatory response. Note that low albumin is no longer used to diagnose malnutrition; [place] uses the new malnutrition guidelines published by the American Society for Parenteral and Enteral Nutrition (A.S.P.E.N.) and the Academy of Nutrition and Dietetics (AND).    Labs reviewed: CBGS: 108-338 (inpatient  orders for glycemic control are 0-9 units of insulin aspart q 4 hours and 30 units insulin determine q HS).   Last Hgb A1c: 7.6 (07/18/17); home DM medications are 40 units levemir q HS, 500 mg Metformin TID, 18 units novolog TID.   Diet Order:  Diet Carb Modified Fluid consistency: Thin; Room service appropriate? Yes  Skin:  Wound (see comment) (closed back incision)  Last BM:  07/15/17  Height:   Ht Readings from Last 1 Encounters:  07/17/17 5' (1.524 m)    Weight:   Wt Readings from Last 1 Encounters:  07/17/17 202 lb 1.6 oz (91.7 kg)    Ideal Body Weight:  45.5 kg  BMI:  Body mass index is 39.47 kg/m.  Estimated Nutritional Needs:   Kcal:  1650-1850  Protein:  75-90 grams  Fluid:  1.6-1.8 L  EDUCATION NEEDS:   Education needs no appropriate at this time  Frankie Scipio A. Mayford Knife, RD, LDN, CDE Pager: [phone] After hours Pager: [phone]

## 2017-07-18 NOTE — Care Management Note (Addendum)
Case Management Note  Patient Details  Name: Kara Kelley MRN: [id number] Date of Birth: [birth year]  Subjective/Objective:     Pt admitted with AMS - recent lumbar fusion surgery               Action/Plan:  PTA independent from home alone - pt plans to discharge to sisters home where sister can provide 24 hour supervision post discharge.  CM made tentative HHPT referral to agency of choice [facility] - referral accepted for both [facility] and DME.  Pt states she has ample help once discharged "both of my daughters will be around and they are aides so they can help me".  Pt states she has PCP and denied barriers to obtaining medications and paying for medications..  CM requested HH and DME orders  Expected Discharge Date:   (unknown)               Expected Discharge Plan:  Home/Self Care  In-House Referral:     Discharge planning Services  CM Consult  Post Acute Care Choice:    Choice offered to:     DME Arranged:  Walker rolling DME Agency:  [facility]  HH Arranged:  PT [place] Agency:  [facility]  Status of Service:  In process, will continue to follow  If discussed at Long Length of Stay Meetings, dates discussed:    Additional Comments:  Cherylann Parr, RN 07/18/2017, 4:20 PM

## 2017-07-19 DIAGNOSIS — E1165 Type 2 diabetes mellitus with hyperglycemia: Secondary | ICD-10-CM

## 2017-07-19 DIAGNOSIS — E118 Type 2 diabetes mellitus with unspecified complications: Secondary | ICD-10-CM

## 2017-07-19 DIAGNOSIS — D62 Acute posthemorrhagic anemia: Secondary | ICD-10-CM

## 2017-07-19 DIAGNOSIS — G9341 Metabolic encephalopathy: Secondary | ICD-10-CM

## 2017-07-19 LAB — CBC
HEMATOCRIT: 20.2 % — AB (ref 36.0–46.0)
Hemoglobin: 6.7 g/dL — CL (ref 12.0–15.0)
MCH: 24.7 pg — ABNORMAL LOW (ref 26.0–34.0)
MCHC: 33.2 g/dL (ref 30.0–36.0)
MCV: 74.5 fL — ABNORMAL LOW (ref 78.0–100.0)
PLATELETS: 150 10*3/uL (ref 150–400)
RBC: 2.71 MIL/uL — ABNORMAL LOW (ref 3.87–5.11)
RDW: 14.3 % (ref 11.5–15.5)
WBC: 5 10*3/uL (ref 4.0–10.5)

## 2017-07-19 LAB — COMPREHENSIVE METABOLIC PANEL
ALT: 15 U/L (ref 14–54)
AST: 16 U/L (ref 15–41)
Albumin: 2.4 g/dL — ABNORMAL LOW (ref 3.5–5.0)
Alkaline Phosphatase: 83 U/L (ref 38–126)
Anion gap: 9 (ref 5–15)
BILIRUBIN TOTAL: 0.4 mg/dL (ref 0.3–1.2)
CO2: 19 mmol/L — ABNORMAL LOW (ref 22–32)
CREATININE: 0.93 mg/dL (ref 0.44–1.00)
Calcium: 8.1 mg/dL — ABNORMAL LOW (ref 8.9–10.3)
Chloride: 110 mmol/L (ref 101–111)
Glucose, Bld: 269 mg/dL — ABNORMAL HIGH (ref 65–99)
POTASSIUM: 3.6 mmol/L (ref 3.5–5.1)
Sodium: 138 mmol/L (ref 135–145)
TOTAL PROTEIN: 5.6 g/dL — AB (ref 6.5–8.1)

## 2017-07-19 LAB — RETICULOCYTES
RBC.: 3.65 MIL/uL — ABNORMAL LOW (ref 3.87–5.11)
RETIC CT PCT: 1.3 % (ref 0.4–3.1)
Retic Count, Absolute: 47.5 10*3/uL (ref 19.0–186.0)

## 2017-07-19 LAB — HEMOGLOBIN AND HEMATOCRIT, BLOOD
HCT: 20.9 % — ABNORMAL LOW (ref 36.0–46.0)
HCT: 28 % — ABNORMAL LOW (ref 36.0–46.0)
Hemoglobin: 6.8 g/dL — CL (ref 12.0–15.0)
Hemoglobin: 9.3 g/dL — ABNORMAL LOW (ref 12.0–15.0)

## 2017-07-19 LAB — URINE CULTURE

## 2017-07-19 LAB — VITAMIN B12: Vitamin B-12: 1450 pg/mL — ABNORMAL HIGH (ref 180–914)

## 2017-07-19 LAB — FOLATE: Folate: 27 ng/mL (ref >5.9)

## 2017-07-19 LAB — MAGNESIUM: Magnesium: 1.6 mg/dL — ABNORMAL LOW (ref 1.7–2.4)

## 2017-07-19 LAB — GLUCOSE, CAPILLARY
GLUCOSE-CAPILLARY: 346 mg/dL — AB (ref 65–99)
GLUCOSE-CAPILLARY: 160 mg/dL — AB (ref 65–99)
Glucose-Capillary: 166 mg/dL — ABNORMAL HIGH (ref 65–99)
Glucose-Capillary: 156 mg/dL — ABNORMAL HIGH (ref 65–99)

## 2017-07-19 LAB — LACTIC ACID, PLASMA: Lactic Acid, Venous: 1.1 mmol/L (ref 0.5–1.9)

## 2017-07-19 LAB — PREPARE RBC (CROSSMATCH)

## 2017-07-19 LAB — FERRITIN: FERRITIN: 209 ng/mL (ref 11–307)

## 2017-07-19 LAB — IRON AND TIBC
Iron: 142 ug/dL (ref 28–170)
Saturation Ratios: 48 % — ABNORMAL HIGH (ref 10.4–31.8)
TIBC: 297 ug/dL (ref 250–450)
UIBC: 155 ug/dL

## 2017-07-19 MED ORDER — SODIUM CHLORIDE 0.9 % IV SOLN: Freq: Once | INTRAVENOUS | Status: DC

## 2017-07-19 MED ORDER — MAGNESIUM OXIDE 400 (241.3 MG) MG PO TABS
400.0000 mg | ORAL_TABLET | Freq: Two times a day (BID) | ORAL | Status: AC
  Administered 2017-07-19 (×2): 400 mg via ORAL
  Filled 2017-07-19 (×2): qty 1

## 2017-07-19 MED FILL — Fentanyl Citrate Preservative Free (PF) Inj 100 MCG/2ML: INTRAMUSCULAR | Qty: 2 | Status: AC

## 2017-07-19 NOTE — Progress Notes (Addendum)
PROGRESS NOTE    Kara Kelley  OZH:[id number] DOB: November 10, 1955 DOA: 07/17/2017 PCP: Dorothyann Peng, MD   Brief Narrative:  61 yo BF PMHx Anxiety, Depression,significant of L4-5 spondylolisthesis  S/P posterior lumbar interbody fusion/autograft by  Dr. Jordan Likes on 07/03/2017, asthma, DM type  2, Polysubstance abuse(cocaine, EtOH),EtOH abuse, Chronic Pain Syndrome,Hepatitis C positive,, HTN,HLD, Anemia   Presented with poor appetite and confusion ever since her surgery. She have not eaten for the past 3 days. Family gave her a bath today and noted drainage from the sight  reports chills and subjective fever No appetite severe back pain. denies urinary or bowel incontinence She has chronic right leg  weakness that is unchanged from prior to Lumbar fusion.     Subjective: 10/10 A/O 4,positive fatigue/sleepiness, negative SOB, negative CP. Positive L-spine pain but states significantly improved from prior surgery. Negative abdominal pain, negative N/V. Patient states chronically on Trazodone for insomnia, however per daughter during hospitalization patient has been trying to exit the bed, seeing people in the room that were not present (hallucinations) after Trazodone administered.     Assessment & Plan:   Active Problems:   Chronic hepatitis C without hepatic coma (HCC)   Sepsis (HCC)   Hyponatremia   Postoperative wound infection   AKI (acute kidney injury) (HCC)   Dehydration   DM type 2 (diabetes mellitus, type 2) (HCC)   Acute encephalopathy   SIRS - lactic acidosis  Corrected with simple volume resuscitation - no evidence of active infection - will dose w/ empiric abx at suggestion of NS but o/w no objective sign of sepsis - suspect this was simple but profound volume depletion due to extremely limited intake over 3+ days     Acute encephalopathy - delirium Appears to have been due primarily to severe DH and hyponatremia - rapidly resolved - follow 24hrs more to assure remains  stable    Hyponatremia - Dehydration  Corrected w/ simple volume resuscitation  -Resolved     Hypomagnesemia -magnesium oxide 400 mg BID   Acute renal failure -Most likely prerenal azotemia  Recent Labs Lab 07/17/17 1634 07/18/17 0216 07/19/17 0302  NA 131* 135 138  -Resolved    DM type 2 uncontrolled with complications A1c 7.6 - CBG climbing w/ resumption of diet - adjust tx and follow   Anemia(baseline Hg 9.5-11)@@@@ -10/10 Transfuse 2 unit PRBC -per patient and daughter on admission to [facility] on 10/8 significant blood loss from surgical site at L-spine therefore blood drop may be just equilibration. No obvious site of blood lost currently -occult blood pending -Anemia panel pending    DVT prophylaxis: SCD (secondary to acute blood loss anemia) Code Status: Full Family Communication: Daughter at bedside for discussion of plan of care Disposition Plan: TBD   Consultants:  Neurosurgery   Procedures/Significant Events:  None   I have personally reviewed and interpreted all radiology studies and my findings are as above.  VENTILATOR SETTINGS:    Cultures   Antimicrobials: Anti-infectives    Start     Stop   07/18/17 1445  doxycycline (VIBRA-TABS) tablet 100 mg         07/18/17 1015  doxycycline (VIBRA-TABS) tablet 100 mg  Status:  Discontinued     07/18/17 1441   07/18/17 0800  vancomycin (VANCOCIN) IVPB 750 mg/150 ml premix  Status:  Discontinued     07/18/17 1012   07/18/17 0200  piperacillin-tazobactam (ZOSYN) IVPB 3.375 g  Status:  Discontinued     07/18/17  1012   07/17/17 1945  piperacillin-tazobactam (ZOSYN) IVPB 3.375 g     07/17/17 2103   07/17/17 1945  vancomycin (VANCOCIN) IVPB 1000 mg/200 mL premix     07/17/17 2314       Devices    LINES / TUBES:      Continuous Infusions: . sodium chloride 75 mL/hr at 07/18/17 2209  . sodium chloride       Objective: Vitals:   07/19/17 0754 07/19/17 0818 07/19/17 0945 07/19/17  1013  BP: (!) 144/90  121/78 98/65  Pulse:  80    Resp:      Temp: 98.1 F (36.7 C)  98.2 F (36.8 C) 98.1 F (36.7 C)  TempSrc: Oral  Oral Oral  SpO2:    96%  Weight:      Height:        Intake/Output Summary (Last 24 hours) at 07/19/17 1104 Last data filed at 07/19/17 0600  Gross per 24 hour  Intake            547.5 ml  Output              300 ml  Net            247.5 ml   Filed Weights   07/17/17 2020 07/17/17 2315  Weight: 196 lb (88.9 kg) 202 lb 1.6 oz (91.7 kg)    Examination:  General: A/O 4, No acute respiratory distress Neck:  Negative scars, masses, torticollis, lymphadenopathy, JVD Lungs: Clear to auscultation bilaterally without wheezes or crackles Cardiovascular: Regular rate and rhythm without murmur gallop or rub normal S1 and S2 Abdomen: MORBIDLY obese, negative, abdominal pain, nondistended, positive soft, bowel sounds, no rebound, no ascites, no appreciable mass. Pain to palpation around L spine incision site could not express any discharge from site covered and clean. Extremities: No significant cyanosis, clubbing, or edema bilateral lower extremities Psychiatric:  Negative depression, negative anxiety, negative fatigue, negative mania  Central nervous system:  Cranial nerves II through XII intact, tongue/uvula midline, all extremities muscle strength 5/5, sensation intact throughout,  negative dysarthria, negative expressive aphasia, negative receptive aphasia.  .     Data Reviewed: Care during the described time interval was provided by me .  I have reviewed this patient's available data, including medical history, events of note, physical examination, and all test results as part of my evaluation.   CBC:  Recent Labs Lab 07/17/17 1634 07/18/17 0216 07/19/17 0302 07/19/17 0824  WBC 11.2* 8.2 5.0  --   NEUTROABS 8.4*  --   --   --   HGB 9.5* 8.3* 6.7* 6.8*  HCT 28.8* 25.2* 20.2* 20.9*  MCV 75.4* 75.4* 74.5*  --   PLT 195 154 150  --     Basic Metabolic Panel:  Recent Labs Lab 07/17/17 1634 07/18/17 0216 07/19/17 0302  NA 131* 135 138  K 4.1 3.8 3.6  CL 98* 107 110  CO2 21* 20* 19*  GLUCOSE 385* 327* 269*  BUN 11 6 <5*  CREATININE 1.32* 1.00 0.93  CALCIUM 9.0 8.2* 8.1*  MG  --  1.6* 1.6*  PHOS  --  2.5  --    GFR: Estimated Creatinine Clearance: 65.8 mL/min (by C-G formula based on SCr of 0.93 mg/dL). Liver Function Tests:  Recent Labs Lab 07/17/17 1634 07/18/17 0216 07/19/17 0302  AST 19 18 16   ALT 21 16 15   ALKPHOS 130* 104 83  BILITOT 0.5 0.5 0.4  PROT 8.0 6.6 5.6*  ALBUMIN  3.4* 2.8* 2.4*    Recent Labs Lab 07/17/17 1634  LIPASE 22   No results for input(s): AMMONIA in the last 168 hours. Coagulation Profile:  Recent Labs Lab 07/18/17 0216  INR 1.10   Cardiac Enzymes: No results for input(s): CKTOTAL, CKMB, CKMBINDEX, TROPONINI in the last 168 hours. BNP (last 3 results) No results for input(s): PROBNP in the last 8760 hours. HbA1C:  Recent Labs  07/18/17 0216  HGBA1C 7.6*   CBG:  Recent Labs Lab 07/18/17 0803 07/18/17 1152 07/18/17 1649 07/18/17 2111 07/19/17 0820  GLUCAP 108* 214* 283* 182* 166*   Lipid Profile: No results for input(s): CHOL, HDL, LDLCALC, TRIG, CHOLHDL, LDLDIRECT in the last 72 hours. Thyroid Function Tests:  Recent Labs  07/18/17 0216  TSH 1.438   Anemia Panel: No results for input(s): VITAMINB12, FOLATE, FERRITIN, TIBC, IRON, RETICCTPCT in the last 72 hours. Urine analysis:    Component Value Date/Time   COLORURINE YELLOW 07/17/2017 1954   APPEARANCEUR CLEAR 07/17/2017 1954   LABSPEC 1.018 07/17/2017 1954   PHURINE 6.0 07/17/2017 1954   GLUCOSEU 50 (A) 07/17/2017 1954   HGBUR NEGATIVE 07/17/2017 1954   BILIRUBINUR NEGATIVE 07/17/2017 1954   KETONESUR NEGATIVE 07/17/2017 1954   PROTEINUR NEGATIVE 07/17/2017 1954   UROBILINOGEN 0.2 06/18/2015 1409   NITRITE NEGATIVE 07/17/2017 1954   LEUKOCYTESUR NEGATIVE 07/17/2017 1954    Sepsis Labs: @LABRCNTIP (procalcitonin:4,lacticidven:4)  ) Recent Results (from the past 240 hour(s))  Blood culture (routine x 2)     Status: None (Preliminary result)   Collection Time: 07/17/17  4:35 PM  Result Value Ref Range Status   Specimen Description BLOOD RIGHT FOREARM  Final   Special Requests   Final    BOTTLES DRAWN AEROBIC AND ANAEROBIC Blood Culture adequate volume   Culture   Final    NO GROWTH < 24 HOURS Performed at [facility], [address] [place], [place] [address]    Report Status PENDING  Incomplete  Blood culture (routine x 2)     Status: None (Preliminary result)   Collection Time: 07/17/17  4:40 PM  Result Value Ref Range Status   Specimen Description LEFT ANTECUBITAL  Final   Special Requests IN PEDIATRIC BOTTLE Blood Culture adequate volume  Final   Culture   Final    NO GROWTH < 24 HOURS Performed at [facility], [address] [place], [place] [address]    Report Status PENDING  Incomplete         Radiology Studies: Dg Chest 2 View  Result Date: 07/17/2017 CLINICAL DATA:  Lower abdominal pain and back pain. Problems since back surgery in September. EXAM: CHEST  2 VIEW COMPARISON:  06/18/2015 FINDINGS: Soft tissue fullness in the paratracheal region is similar to the previous examination and probably related to vascular structures. There is a chronic density or calcification in the lateral left lower lung. Heart size is within normal limits and stable. No focal airspace disease or pulmonary edema. Degenerative endplate changes in lower thoracic spine. No large pleural effusions. IMPRESSION: No active cardiopulmonary disease. Electronically Signed   By: Richarda Overlie M.D.   On: 07/17/2017 18:05   Ct Abdomen Pelvis W Contrast  Result Date: 07/17/2017 CLINICAL DATA:  Back surgery in September 24th. Patient has not been eating well. EXAM: CT ABDOMEN AND PELVIS WITH CONTRAST TECHNIQUE: Multidetector CT imaging of the abdomen and pelvis  was performed using the standard protocol following bolus administration of intravenous contrast. CONTRAST:  ISOVUE-300  IOPAMIDOL (ISOVUE-300) INJECTION 61% COMPARISON:  05/21/2011 FINDINGS: Lower chest: Lung bases are clear. Hepatobiliary: Gallbladder has been removed. Portal venous system is patent. No suspicious liver lesions. Pancreas: Normal appearance of the pancreas without inflammation or duct dilatation. Spleen: Normal appearance of spleen without enlargement. Adrenals/Urinary Tract: Normal adrenal glands. Tiny stone in the right kidney lower pole without hydronephrosis. Mild distention of the urinary bladder. Normal appearance the left kidney without hydronephrosis. Stomach/Bowel: Small hiatal hernia. No bowel dilatation or obstruction. No acute inflammatory changes in the bowel. Vascular/Lymphatic: Mild atherosclerotic disease in the aorta and iliac vessels without aneurysm. IVC and iliac veins are patent. No significant lymph node enlargement in the abdomen or pelvis. Reproductive: Uterus and bilateral adnexa are unremarkable. Other: There is an air-fluid collection in the subcutaneous tissues posterior to the lumbar spine and surgical level. This collection measures 11 x 4.4 x 7.1 cm. Air is the largest component to this collection. There is subcutaneous edema surrounding this collection. This collection does not extend into the paraspinal musculature. Again noted is a finger-like projection of tissue along the anterior abdominal cavity above the umbilicus. This could be associated with a ventral hernia. There is a new small nodular component just cephalad to the chronic larger finger-like projection. This could be associated with fat necrosis. No other suspicious abdominal nodules. There is an umbilical hernia containing fat. Musculoskeletal: Bilateral pedicle screw and rod fixation at L5-S1 with interbody device. Surgical hardware is intact. Again noted is a large Schmorl's node along the  superior endplate of T12. IMPRESSION: Large gas-filled collection in the subcutaneous tissues posterior to the lumbar spine surgery site. This collection measures over 11 cm. Findings are concerning for a postoperative wound infection. The gas does not track anterior to the vertebral bodies or spinal canal. Gas collection is relatively confined to the subcutaneous tissues. Nonobstructive right kidney stone. Chronic finger-like projection in the anterior abdomen possibly related to a ventral hernia and/or fat necrosis. Electronically Signed   By: Richarda Overlie M.D.   On: 07/17/2017 19:28        Scheduled Meds: . atorvastatin  10 mg Oral Daily  . buPROPion  300 mg Oral q morning - 10a  . doxycycline  100 mg Oral Q12H  . enoxaparin (LOVENOX) injection  40 mg Subcutaneous Q24H  . insulin aspart  0-15 Units Subcutaneous TID WC  . insulin aspart  0-5 Units Subcutaneous QHS  . insulin detemir  30 Units Subcutaneous QHS  . multivitamin with minerals  1 tablet Oral Daily  . polyethylene glycol  17 g Oral Daily  . senna  1 tablet Oral BID  . traZODone  100 mg Oral QHS   Continuous Infusions: . sodium chloride 75 mL/hr at 07/18/17 2209  . sodium chloride       LOS: 2 days    Time spent: 40 minutes    Lorianne Malbrough, Roselind Messier, MD [facility] Pager [phone]   If 7PM-7AM, please contact night-coverage [email or web address] Password [email or web address] 07/19/2017, 11:04 AM

## 2017-07-19 NOTE — Evaluation (Signed)
Occupational Therapy Evaluation Patient Details Name: Kara Kelley MRN: [id number] DOB: [birth year] Today's Date: 07/19/2017    History of Present Illness Pt is a 61 yo female admitted with serosanguinous discharge from surgical site and altered mental status s/p PLIF at L4-L5 9/24. PMH: anxiety, ETOH abuse, DM, GERD, migraines, COPD, cocaine abuse, hep C, HTN PSH: mulitple foot surgeries, osteotomy, cholecystectomy.   Clinical Impression   Since recent L4-5 PLIF, pt has been ambulating with cane and daughter has been assisting with ADL tasks. Pt reports two recent falls walking in the hallway without assistive device and with no light. She currently requires VC's for adherence to back precautions throughout all ADL tasks as well as min guard assist for functional mobility during toilet transfers. Pt may benefit from home health OT follow-up post-acute D/C to maximize safety in home environment post-acute D/C as pt with difficulty safely navigating. OT will continue to follow while admitted and update D/C recommendations as appropriate.    Follow Up Recommendations  Home health OT;Supervision/Assistance - 24 hour    Equipment Recommendations   (RW 2-wheeled)    Recommendations for Other Services       Precautions / Restrictions Precautions Precautions: Back Precaution Comments: Able to recall 2/3 precautions Required Braces or Orthoses:  (no brace present or order; did have lumbar corsett last adm) Restrictions Weight Bearing Restrictions: No      Mobility Bed Mobility               General bed mobility comments: Pt sitting at EOB. Need to educate concerning log roll.  Transfers Overall transfer level: Needs assistance Equipment used: Rolling walker (2 wheeled) Transfers: Sit to/from Stand Sit to Stand: Min guard         General transfer comment: Cueing for safe use of RW.    Balance Overall balance assessment: Needs assistance Sitting-balance support: No  upper extremity supported;Feet supported Sitting balance-Leahy Scale: Good     Standing balance support: No upper extremity supported Standing balance-Leahy Scale: Fair Standing balance comment: UE support for dynamic standing tasks.                            ADL either performed or assessed with clinical judgement   ADL Overall ADL's : Needs assistance/impaired Eating/Feeding: Set up;Sitting   Grooming: Supervision/safety;Standing;Wash/dry hands;Cueing for safety Grooming Details (indicate cue type and reason): Cues for back precautions and keeping body within RW Upper Body Bathing: Set up;Sitting   Lower Body Bathing: Min guard;Sit to/from stand;Cueing for back precautions   Upper Body Dressing : Supervision/safety;Sitting   Lower Body Dressing: Min guard;Sit to/from stand   Toilet Transfer: Min guard;Ambulation;BSC;RW           Functional mobility during ADLs: Min guard;Rolling walker General ADL Comments: Pt with no brace present in room and no order in order set this admission. However, she did have orders for lumbar corsett following surgery on last admission.      Vision Baseline Vision/History: Wears glasses Patient Visual Report: No change from baseline Vision Assessment?: No apparent visual deficits Additional Comments: glasses not in room     Perception     Praxis      Pertinent Vitals/Pain Pain Assessment: Faces Faces Pain Scale: Hurts little more Pain Location: lower back Pain Descriptors / Indicators: Aching;Sore Pain Intervention(s): Monitored during session;Repositioned;Limited activity within patient's tolerance     Hand Dominance     Extremity/Trunk Assessment Upper Extremity Assessment Upper  Extremity Assessment: Overall WFL for tasks assessed   Lower Extremity Assessment Lower Extremity Assessment: Defer to PT evaluation   Cervical / Trunk Assessment Cervical / Trunk Assessment: Other exceptions Cervical / Trunk  Exceptions: s/p spinal surgery   Communication Communication Communication: No difficulties   Cognition Arousal/Alertness: Awake/alert Behavior During Therapy: WFL for tasks assessed/performed Overall Cognitive Status: Within Functional Limits for tasks assessed                                     General Comments  VSS throughout. No dizziness or lightheadedness reported. Asymptomatic despite low hemoglobin.     Exercises     Shoulder Instructions      Home Living Family/patient expects to be discharged to:: Private residence Living Arrangements: Other relatives Available Help at Discharge: Family;Available 24 hours/day Type of Home: House Home Access: Stairs to enter [employer] of Steps: 4 Entrance Stairs-Rails: Can reach both Home Layout: One level     Bathroom Shower/Tub: Producer, television/film/video: Handicapped height Bathroom Accessibility: Yes   Home Equipment: Bedside commode;Cane - single point;Adaptive equipment Adaptive Equipment: Reacher;Sock aid        Prior Functioning/Environment Level of Independence: Needs assistance  Gait / Transfers Assistance Needed: ambulates with cane; 2 falls since previous admission not using assistive device ADL's / Homemaking Assistance Needed: assist with bathing and dressing since spinal surgery            OT Problem List: Decreased activity tolerance;Impaired balance (sitting and/or standing);Decreased safety awareness;Decreased knowledge of use of DME or AE;Decreased knowledge of precautions;Pain      OT Treatment/Interventions: Self-care/ADL training;Therapeutic exercise;Energy conservation;DME and/or AE instruction;Therapeutic activities;Patient/family education;Balance training    OT Goals(Current goals can be found in the care plan section) Acute Rehab OT Goals Patient Stated Goal: feel better and move around without pain OT Goal Formulation: With patient/family Time For Goal  Achievement: 08/02/17 Potential to Achieve Goals: Good ADL Goals Pt Will Perform Grooming: with modified independence;standing Pt Will Perform Lower Body Dressing: with modified independence;sit to/from stand Pt Will Transfer to Toilet: with modified independence;ambulating;regular height toilet (with RW) Pt Will Perform Toileting - Clothing Manipulation and hygiene: with modified independence;sit to/from stand Pt Will Perform Tub/Shower Transfer: with modified independence;ambulating;Shower transfer;rolling walker;3 in 1 Additional ADL Goal #1: Pt will verbalize 3/3 back precautions with no verbal cues.   OT Frequency: Min 2X/week   Barriers to D/C:            Co-evaluation              AM-PAC PT "6 Clicks" Daily Activity     Outcome Measure Help from another person eating meals?: None Help from another person taking care of personal grooming?: A Little Help from another person toileting, which includes using toliet, bedpan, or urinal?: A Little Help from another person bathing (including washing, rinsing, drying)?: A Little Help from another person to put on and taking off regular upper body clothing?: A Little Help from another person to put on and taking off regular lower body clothing?: A Little 6 Click Score: 19   End of Session Equipment Utilized During Treatment: Rolling walker (no brace in room) Nurse Communication: Mobility status (cleared for participation)  Activity Tolerance: Patient tolerated treatment well Patient left: in bed;with call bell/phone within reach;with family/visitor present (sitting at EOB eating breakfast)  OT Visit Diagnosis: Other abnormalities of gait and mobility (  R26.89);Pain Pain - Right/Left:  (back) Pain - part of body:  (back)                Time: 1610-9604 OT Time Calculation (min): 23 min Charges:  OT General Charges $OT Visit: 1 Visit OT Evaluation $OT Eval Moderate Complexity: 1 Mod OT Treatments $Self Care/Home Management :  8-22 mins G-Codes:     Doristine Section, MS OTR/L  Pager: [phone]   Addison Whidbee A Tayson Schnelle 07/19/2017, 9:42 AM

## 2017-07-19 NOTE — Progress Notes (Signed)
Physical Therapy Treatment Patient Details Name: Kara Kelley MRN: [id number] DOB: Oct 16, 1955 Today's Date: 07/19/2017    History of Present Illness Pt is a 61 yo female admitted with serosanguinous discharge from surgical site and altered mental status s/p PLIF at L4-L5 9/24. PMH: anxiety, ETOH abuse, DM, GERD, migraines, COPD, cocaine abuse, hep C, HTN PSH: mulitple foot surgeries, osteotomy, cholecystectomy.    PT Comments    Pt is making good progress towards her goals today and is currently supervision for transfers and min guard for ambulation of 175 feet with RW. Pt limited by pain in her tail bone where she fell prior to admission asked RN to order Geo pad to disperse pressure while she is sitting in the recliner. Pt requires skilled PT to progress mobility and improve strength and endurance to safely navigate their discharge environment.    Follow Up Recommendations  Home health PT;Supervision/Assistance - 24 hour     Equipment Recommendations  Rolling walker with 5" wheels    Recommendations for Other Services       Precautions / Restrictions Restrictions Weight Bearing Restrictions: No    Mobility  Bed Mobility               General bed mobility comments: Pt sitting at EOB. Need to educate concerning log roll.  Transfers Overall transfer level: Needs assistance Equipment used: Rolling walker (2 wheeled) Transfers: Sit to/from Stand Sit to Stand: Supervision         General transfer comment: good power up from recliner and steadying in standing  Ambulation/Gait Ambulation/Gait assistance: Min guard Ambulation Distance (Feet): 175 Feet Assistive device: Rolling walker (2 wheeled) Gait Pattern/deviations: Step-to pattern;Decreased stance time - right;Decreased step length - left;Decreased weight shift to right;Shuffle;Antalgic Gait velocity: decreased Gait velocity interpretation: Below normal speed for age/gender General Gait Details: hands on min  guard for safety, slow, steady cadence with decreasing weightbearing through R LE as pain increased with gait       Balance Overall balance assessment: Needs assistance Sitting-balance support: No upper extremity supported;Feet supported Sitting balance-Leahy Scale: Good     Standing balance support: No upper extremity supported Standing balance-Leahy Scale: Fair                              Cognition Arousal/Alertness: Awake/alert Behavior During Therapy: WFL for tasks assessed/performed Overall Cognitive Status: Within Functional Limits for tasks assessed                                        Exercises      General Comments General comments (skin integrity, edema, etc.): VSS throughout       Pertinent Vitals/Pain Pain Assessment: 0-10 Pain Score: 7  Pain Location: tail bone, lower back and right leg Pain Descriptors / Indicators: Aching;Sore Pain Intervention(s): Monitored during session;Limited activity within patient's tolerance;Other (comment) (requested GeoMat for sitting in recliner)    Home Living Family/patient expects to be discharged to:: Private residence Living Arrangements: Other relatives Available Help at Discharge: Family;Available 24 hours/day Type of Home: House Home Access: Stairs to enter Entrance Stairs-Rails: Can reach both Home Layout: One level Home Equipment: Bedside commode;Cane - single point;Adaptive equipment      Prior Function Level of Independence: Needs assistance  Gait / Transfers Assistance Needed: ambulates with cane; 2 falls since previous admission not using assistive  device ADL's / Homemaking Assistance Needed: assist with bathing and dressing since spinal surgery     PT Goals (current goals can now be found in the care plan section) Acute Rehab PT Goals Patient Stated Goal: feel better and move around without pain PT Goal Formulation: With patient Time For Goal Achievement: 08/01/17 Potential  to Achieve Goals: Good Progress towards PT goals: Progressing toward goals    Frequency    Min 3X/week      PT Plan Current plan remains appropriate    Co-evaluation              AM-PAC PT "6 Clicks" Daily Activity  Outcome Measure  Difficulty turning over in bed (including adjusting bedclothes, sheets and blankets)?: A Little Difficulty moving from lying on back to sitting on the side of the bed? : A Little Difficulty sitting down on and standing up from a chair with arms (e.g., wheelchair, bedside commode, etc,.)?: A Little Help needed moving to and from a bed to chair (including a wheelchair)?: A Little Help needed walking in hospital room?: A Little Help needed climbing 3-5 steps with a railing? : A Lot 6 Click Score: 17    End of Session Equipment Utilized During Treatment: Gait belt Activity Tolerance: Patient tolerated treatment well Patient left: in chair;with call bell/phone within reach;with family/visitor present Nurse Communication: Mobility status PT Visit Diagnosis: Unsteadiness on feet (R26.81);Other abnormalities of gait and mobility (R26.89);Muscle weakness (generalized) (M62.81);Difficulty in walking, not elsewhere classified (R26.2);Pain Pain - Right/Left: Right Pain - part of body: Leg     Time: 1610-9604 PT Time Calculation (min) (ACUTE ONLY): 27 min  Charges:  $Gait Training: 23-37 mins                    G Codes:       Tomeeka Plaugher B. Beverely Risen PT, DPT Acute Rehabilitation  [phone] Pager [phone]     Elon Alas Fleet 07/19/2017, 2:56 PM

## 2017-07-19 NOTE — Progress Notes (Signed)
Notified MD of critical hgb 6.7

## 2017-07-19 NOTE — Progress Notes (Signed)
Inpatient Diabetes Program Recommendations  AACE/ADA: New Consensus Statement on Inpatient Glycemic Control (2015)  Target Ranges:  Prepandial:   less than 140 mg/dL      Peak postprandial:   less than 180 mg/dL (1-2 hours)      Critically ill patients:  140 - 180 mg/dL   Lab Results  Component Value Date   GLUCAP 166 (H) 07/19/2017   HGBA1C 7.6 (H) 07/18/2017    Review of Glycemic Control Results for SHAKEMIA, MADERA (MRN [id number]) as of 07/19/2017 11:28  Ref. Range 07/18/2017 08:03 07/18/2017 11:52 07/18/2017 16:49 07/18/2017 21:11 07/19/2017 08:20  Glucose-Capillary Latest Ref Range: 65 - 99 mg/dL 409 (H) 811 (H) 914 (H) 182 (H) 166 (H)   Diabetes history: DM type 2 Outpatient Diabetes medications: Levemir 40 units q HS, Metformin 500 mg tid with meals, Novolog flex pen 18 units tid with meals Current orders for Inpatient glycemic control: Novolog moderate tid with meals and HS, Levemir 30 units q HS Inpatient Diabetes Program Recommendations:   Please consider adding Novolog meal coverage 5 units tid with meals.    Thanks, Beryl Meager, RN, BC-ADM Inpatient Diabetes Coordinator Pager [phone] (8a-5p)

## 2017-07-20 DIAGNOSIS — E1165 Type 2 diabetes mellitus with hyperglycemia: Secondary | ICD-10-CM

## 2017-07-20 DIAGNOSIS — E872 Acidosis, unspecified: Secondary | ICD-10-CM

## 2017-07-20 DIAGNOSIS — D62 Acute posthemorrhagic anemia: Secondary | ICD-10-CM

## 2017-07-20 DIAGNOSIS — R6510 Systemic inflammatory response syndrome (SIRS) of non-infectious origin without acute organ dysfunction: Secondary | ICD-10-CM

## 2017-07-20 DIAGNOSIS — E11319 Type 2 diabetes mellitus with unspecified diabetic retinopathy without macular edema: Secondary | ICD-10-CM

## 2017-07-20 DIAGNOSIS — E1122 Type 2 diabetes mellitus with diabetic chronic kidney disease: Secondary | ICD-10-CM

## 2017-07-20 DIAGNOSIS — E118 Type 2 diabetes mellitus with unspecified complications: Secondary | ICD-10-CM

## 2017-07-20 DIAGNOSIS — N179 Acute kidney failure, unspecified: Secondary | ICD-10-CM

## 2017-07-20 LAB — BASIC METABOLIC PANEL
ANION GAP: 6 (ref 5–15)
BUN: <5 mg/dL — ABNORMAL LOW (ref 6–20)
CALCIUM: 8.4 mg/dL — AB (ref 8.9–10.3)
CO2: 21 mmol/L — AB (ref 22–32)
CREATININE: 0.77 mg/dL (ref 0.44–1.00)
Chloride: 110 mmol/L (ref 101–111)
Glucose, Bld: 243 mg/dL — ABNORMAL HIGH (ref 65–99)
Potassium: 3.4 mmol/L — ABNORMAL LOW (ref 3.5–5.1)
SODIUM: 137 mmol/L (ref 135–145)

## 2017-07-20 LAB — TYPE AND SCREEN
ABO/RH(D): A POS
Antibody Screen: NEGATIVE
Unit division: 0
Unit division: 0

## 2017-07-20 LAB — GLUCOSE, CAPILLARY
GLUCOSE-CAPILLARY: 176 mg/dL — AB (ref 65–99)
GLUCOSE-CAPILLARY: 167 mg/dL — AB (ref 65–99)

## 2017-07-20 LAB — BPAM RBC
Blood Product Expiration Date: 201810172359
Blood Product Expiration Date: 201810222359
ISSUE DATE / TIME: 201810100938
ISSUE DATE / TIME: 201810101520
Unit Type and Rh: 6200
Unit Type and Rh: 6200

## 2017-07-20 LAB — CBC
HCT: 27.7 % — ABNORMAL LOW (ref 36.0–46.0)
Hemoglobin: 9.2 g/dL — ABNORMAL LOW (ref 12.0–15.0)
MCH: 25.5 pg — ABNORMAL LOW (ref 26.0–34.0)
MCHC: 33.2 g/dL (ref 30.0–36.0)
MCV: 76.7 fL — ABNORMAL LOW (ref 78.0–100.0)
PLATELETS: 144 10*3/uL — AB (ref 150–400)
RBC: 3.61 MIL/uL — AB (ref 3.87–5.11)
RDW: 14.7 % (ref 11.5–15.5)
WBC: 5 10*3/uL (ref 4.0–10.5)

## 2017-07-20 LAB — MAGNESIUM: MAGNESIUM: 1.6 mg/dL — AB (ref 1.7–2.4)

## 2017-07-20 MED ORDER — INSULIN DETEMIR 100 UNIT/ML ~~LOC~~ SOLN
35.0000 [IU] | Freq: Every day | SUBCUTANEOUS | Status: DC
  Filled 2017-07-20: qty 0.35

## 2017-07-20 MED ORDER — DOXYCYCLINE HYCLATE 100 MG PO TABS: 100.0000 mg | ORAL_TABLET | Freq: Two times a day (BID) | ORAL | 0 refills | Status: DC

## 2017-07-20 NOTE — Care Management Note (Signed)
Case Management Note  Patient Details  Name: CORIANA ANGELLO MRN: [id number] Date of Birth: 05-26-1956  Subjective/Objective:     Pt admitted with AMS - recent lumbar fusion surgery               Action/Plan:  PTA independent from home alone - pt plans to discharge to sisters home where sister can provide 24 hour supervision post discharge.  CM made tentative HHPT referral to agency of choice [facility] - referral accepted for both [facility] and DME.  Pt states she has ample help once discharged "both of my daughters will be around and they are aides so they can help me".  Pt states she has PCP and denied barriers to obtaining medications and paying for medications..  CM requested HH and DME orders.  Expected Discharge Date:  07/20/17               Expected Discharge Plan:  Home/Self Care  In-House Referral:     Discharge planning Services  CM Consult  Post Acute Care Choice:    Choice offered to:     DME Arranged:  Walker rolling DME Agency:  [facility]  HH Arranged:  PT, OT [facility] Agency:  [facility]  Status of Service:  In process, will continue to follow  If discussed at Long Length of Stay Meetings, dates discussed:    Additional Comments:  Cherylann Parr, RN 07/20/2017, 2:39 PM

## 2017-07-20 NOTE — Progress Notes (Signed)
Pt. Was given discharge paperwork. Understood all instructions and has all belongings. Waiting for PT to deliver walker

## 2017-07-20 NOTE — Discharge Summary (Signed)
Physician Discharge Summary  Kara Kelley OVZ:[id number] DOB: 01-Jul-1956 DOA: 07/17/2017  PCP: Glendale Chard, MD  Admit date: 07/17/2017 Discharge date: 07/20/2017  Time spent: 35 minutes  Recommendations for Outpatient Follow-up:  SIRS/ Inferior Wound Dehiscence - lactic acidosis  -No evidence of active infection, however will complete 2 week course of prophylactic doxycycline per Neurosurgery recommendation. -S/P posterior lumbar interbody fusion/autograft  -Schedule follow-up appointment with Dr. Earnie Larsson neurosurgery in 2-4 weeks, S/P lumbar fusion/autograft with bleeding from surgical site.  Acute Encephalopathy -primarily secondary to severe dehydration and hyponatremia, with fluid resuscitation resolved   Hyponatremia -resolved    Hypomagnesemia -Magnesium oxide 400 mg BID   Acute renal failure -Most likely prerenal azotemia    Recent Labs Lab 07/17/17 1634 07/18/17 0216 07/19/17 0302 07/20/17 0259  CREATININE 1.32* 1.00 0.93 0.77  -at baseline  Diabetes type 2 uncontrolled with complications -41/2 Hemoglobin A1c=7.6  -Levemir 40 units QHS(home dose) -Metformin 500 mg TID(home dose) -NovoLog 18 units TID(home dose)   Acute blood loss Anemia(baseline Hg 9.5-11) -10/10 Transfuse 2 unit PRBC -Anemia panel pending -Schedule with Dr. Bryon Lions in 1-2 weeks acute blood loss anemia, SIRS, diabetes type 2 uncontrolled with complications      Discharge Diagnoses:  Active Problems:   Chronic hepatitis C without hepatic coma (HCC)   Sepsis (HCC)   Hyponatremia   Postoperative wound infection   AKI (acute kidney injury) ([place])   Dehydration   DM type 2 (diabetes mellitus, type 2) ([place])   Acute encephalopathy   Discharge Condition: stable  Diet recommendation: American diabetic Association  Filed Weights   07/17/17 2020 07/17/17 2315  Weight: 196 lb (88.9 kg) 202 lb 1.6 oz (91.7 kg)    History of present illness:  61 yo BF PMHx Anxiety,  Depression,significant of L4-5 spondylolisthesis  S/P posterior lumbar interbody fusion/autograft by  Dr. Earnie Larsson on 07/03/2017, asthma, DM type  2, Polysubstance abuse(cocaine, EtOH),EtOH abuse, Chronic Pain Syndrome,Hepatitis C positive,, HTN,HLD, Anemia   Presented with poor appetite and confusion ever since her surgery. She have not eaten for the past 3 days. Family gave her a bath today and noted drainage from the sight  reports chills and subjective fever No appetite severe back pain. denies urinary or bowel incontinence She has chronic right leg  weakness that is unchanged from prior to Lumbar fusion.   During this hospitalization patient was treated for SIRS, acute metabolic encephalopathy which was multifactorial to include acute blood loss anemia, dehydration Patient responded to fluid resuscitation as well as blood replacement.patient's surgical site evaluated by neurosurgery who cleared patient I..e no intervention required. Patient stable for discharge with close follow-up with neurosurgeon and PCP.[name]      Consultations: Neurosurgery   Cultures  10/8 blood NGTD 10/8 urine positive multiple species   Antibiotics Anti-infectives    Start     Ordered Stop   07/18/17 1445  doxycycline (VIBRA-TABS) tablet 100 mg     07/18/17 1441     07/18/17 1015  doxycycline (VIBRA-TABS) tablet 100 mg  Status:  Discontinued     07/18/17 1012 07/18/17 1441   07/18/17 0800  vancomycin (VANCOCIN) IVPB 750 mg/150 ml premix  Status:  Discontinued     07/17/17 2111 07/18/17 1012   07/18/17 0200  piperacillin-tazobactam (ZOSYN) IVPB 3.375 g  Status:  Discontinued     07/17/17 2111 07/18/17 1012   07/17/17 1945  piperacillin-tazobactam (ZOSYN) IVPB 3.375 g     07/17/17 1939 07/17/17 2103   07/17/17 1945  vancomycin (VANCOCIN) IVPB 1000 mg/200 mL premix     07/17/17 1939 07/17/17 2314       Discharge Exam: Vitals:   07/20/17 0425 07/20/17 0728 07/20/17 1157 07/20/17 1200  BP:  (!) 155/87  (!) 148/81 (!) 148/81  Pulse: 69 84 (!) 58 70  Resp:  16 14   Temp:  98.4 F (36.9 C) 97.6 F (36.4 C)   TempSrc:  Oral Oral   SpO2: 98% 96% 100% 100%  Weight:      Height:        General: A/O 4, negative acute respiratory distress Cardiovascular: regular rhythm and rate, negative murmurs rubs gallops, normal S1/S2 Respiratory: clear to auscultation bilateral,negative wheezes, negative crackles Abdomen: MORBIDLY obese, negative abdominal pain, negative distention, positive soft bowel sounds, no rebound, no ascites, pain to palpation around L spine incision site with scant serous fluid expressed (no sign of infection/bleed)  Discharge Instructions   Allergies as of 07/20/2017   No Known Allergies     Medication List    STOP taking these medications   lisinopril 2.5 MG tablet Commonly known as:  PRINIVIL,ZESTRIL   prazosin 5 MG capsule Commonly known as:  MINIPRESS   spironolactone 50 MG tablet Commonly known as:  ALDACTONE   traZODone 150 MG tablet Commonly known as:  DESYREL     TAKE these medications   ACCU-CHEK AVIVA PLUS w/Device Kit 1 Device by Does not apply route once.   albuterol 108 (90 Base) MCG/ACT inhaler Commonly known as:  PROVENTIL HFA;VENTOLIN HFA Inhale 2 puffs into the lungs every 6 (six) hours as needed. For shortness of breath.   atorvastatin 10 MG tablet Commonly known as:  LIPITOR Take 10 mg by mouth daily.   buPROPion 300 MG 24 hr tablet Commonly known as:  WELLBUTRIN XL Take 300 mg by mouth every morning.   diazepam 5 MG tablet Commonly known as:  VALIUM Take 1-2 tablets (5-10 mg total) by mouth every 6 (six) hours as needed for muscle spasms.   doxycycline 100 MG tablet Commonly known as:  VIBRA-TABS Take 1 tablet (100 mg total) by mouth every 12 (twelve) hours.   glucose blood test strip Commonly known as:  ACCU-CHEK AVIVA 1 each by Other route 2 (two) times daily. And lancets 2/day 250.43 What changed:  when to take  this  additional instructions   HYDROcodone-acetaminophen 10-325 MG tablet Commonly known as:  NORCO Take 1-2 tablets by mouth every 4 (four) hours as needed (breakthrough pain).   LEVEMIR FLEXTOUCH 100 UNIT/ML Pen Generic drug:  Insulin Detemir Inject 40 Units into the skin at bedtime.   metFORMIN 500 MG tablet Commonly known as:  GLUCOPHAGE Take 500 mg by mouth 3 (three) times daily with meals.   multivitamin with minerals Tabs tablet Take 1 tablet by mouth daily. Centrum Silver   NOVOLOG FLEXPEN 100 UNIT/ML FlexPen Generic drug:  insulin aspart Inject 18 Units into the skin 3 (three) times daily as needed for high blood sugar.   omeprazole 40 MG capsule Commonly known as:  PRILOSEC Take 40 mg by mouth daily before breakfast.            Durable Medical Equipment        Start     Ordered   07/18/17 1614  For home use only DME Walker rolling  Once    Question:  Patient needs a walker to treat with the following condition  Answer:  Mobility impaired   07/18/17 1614  No Known Allergies Follow-up Information    [facility] Follow up.   Why:  rolling walker  Contact information: [address] [place] [address] [phone]        [facility] Follow up.   Why:  physical therapy Contact information: [place] [address] [phone]        Earnie Larsson, MD. Schedule an appointment as soon as possible for a visit in 3 week(s).   Specialty:  Neurosurgery Why:  Schedule follow-up appointment with Dr. Earnie Larsson neurosurgery in 2-4 weeks, S/P lumbar fusion/autograft with bleeding from surgical site. Contact information: [address] [place] [address] [place] [address] [phone]        Glendale Chard, MD. Schedule an appointment as soon as possible for a visit in 2 week(s).   Specialty:  Internal Medicine Why:  Schedule with Dr. Bryon Lions in 1-2 weeks acute blood loss  anemia, SIRS, diabetes type 2 uncontrolled with complications Contact information: [address] [place] [address] [phone]        Please follow up.   Why:  OCT. 18, 2018    _0 :30 am           The results of significant diagnostics from this hospitalization (including imaging, microbiology, ancillary and laboratory) are listed below for reference.    Significant Diagnostic Studies: Dg Chest 2 View  Result Date: 07/17/2017 CLINICAL DATA:  Lower abdominal pain and back pain. Problems since back surgery in September. EXAM: CHEST  2 VIEW COMPARISON:  06/18/2015 FINDINGS: Soft tissue fullness in the paratracheal region is similar to the previous examination and probably related to vascular structures. There is a chronic density or calcification in the lateral left lower lung. Heart size is within normal limits and stable. No focal airspace disease or pulmonary edema. Degenerative endplate changes in lower thoracic spine. No large pleural effusions. IMPRESSION: No active cardiopulmonary disease. Electronically Signed   By: Markus Daft M.D.   On: 07/17/2017 18:05   Dg Lumbar Spine 2-3 Views  Result Date: 07/03/2017 CLINICAL DATA:  Posterior fusion L4-5 EXAM: DG C-ARM 61-120 MIN; LUMBAR SPINE - 2-3 VIEW COMPARISON:  None. FINDINGS: Two intraoperative spot images demonstrate changes of posterior fusion at L4-5. No visible hardware complicating feature. IMPRESSION: L4-5 fusion.  No visible complicating feature. Electronically Signed   By: Rolm Baptise M.D.   On: 07/03/2017 10:06   Ct Abdomen Pelvis W Contrast  Result Date: 07/17/2017 CLINICAL DATA:  Back surgery in September 24th. Patient has not been eating well. EXAM: CT ABDOMEN AND PELVIS WITH CONTRAST TECHNIQUE: Multidetector CT imaging of the abdomen and pelvis was performed using the standard protocol following bolus administration of intravenous contrast. CONTRAST:  156m ISOVUE-300 IOPAMIDOL (ISOVUE-300) INJECTION  61% COMPARISON:  05/21/2011 FINDINGS: Lower chest: Lung bases are clear. Hepatobiliary: Gallbladder has been removed. Portal venous system is patent. No suspicious liver lesions. Pancreas: Normal appearance of the pancreas without inflammation or duct dilatation. Spleen: Normal appearance of spleen without enlargement. Adrenals/Urinary Tract: Normal adrenal glands. Tiny stone in the right kidney lower pole without hydronephrosis. Mild distention of the urinary bladder. Normal appearance the left kidney without hydronephrosis. Stomach/Bowel: Small hiatal hernia. No bowel dilatation or obstruction. No acute inflammatory changes in the bowel. Vascular/Lymphatic: Mild atherosclerotic disease in the aorta and iliac vessels without aneurysm. IVC and iliac veins are patent. No significant lymph node enlargement in the abdomen or pelvis. Reproductive: Uterus and bilateral  adnexa are unremarkable. Other: There is an air-fluid collection in the subcutaneous tissues posterior to the lumbar spine and surgical level. This collection measures 11 x 4.4 x 7.1 cm. Air is the largest component to this collection. There is subcutaneous edema surrounding this collection. This collection does not extend into the paraspinal musculature. Again noted is a finger-like projection of tissue along the anterior abdominal cavity above the umbilicus. This could be associated with a ventral hernia. There is a new small nodular component just cephalad to the chronic larger finger-like projection. This could be associated with fat necrosis. No other suspicious abdominal nodules. There is an umbilical hernia containing fat. Musculoskeletal: Bilateral pedicle screw and rod fixation at L5-S1 with interbody device. Surgical hardware is intact. Again noted is a large Schmorl's node along the superior endplate of U72. IMPRESSION: Large gas-filled collection in the subcutaneous tissues posterior to the lumbar spine surgery site. This collection measures  over 11 cm. Findings are concerning for a postoperative wound infection. The gas does not track anterior to the vertebral bodies or spinal canal. Gas collection is relatively confined to the subcutaneous tissues. Nonobstructive right kidney stone. Chronic finger-like projection in the anterior abdomen possibly related to a ventral hernia and/or fat necrosis. Electronically Signed   By: Markus Daft M.D.   On: 07/17/2017 19:28   Dg C-arm 1-60 Min  Result Date: 07/03/2017 CLINICAL DATA:  Posterior fusion L4-5 EXAM: DG C-ARM 61-120 MIN; LUMBAR SPINE - 2-3 VIEW COMPARISON:  None. FINDINGS: Two intraoperative spot images demonstrate changes of posterior fusion at L4-5. No visible hardware complicating feature. IMPRESSION: L4-5 fusion.  No visible complicating feature. Electronically Signed   By: Rolm Baptise M.D.   On: 07/03/2017 10:06    Microbiology: Recent Results (from the past 240 hour(s))  Blood culture (routine x 2)     Status: None (Preliminary result)   Collection Time: 07/17/17  4:35 PM  Result Value Ref Range Status   Specimen Description BLOOD RIGHT FOREARM  Final   Special Requests   Final    BOTTLES DRAWN AEROBIC AND ANAEROBIC Blood Culture adequate volume   Culture   Final    NO GROWTH 2 DAYS Performed at [facility], [address], [place], [place] [address]    Report Status PENDING  Incomplete  Blood culture (routine x 2)     Status: None (Preliminary result)   Collection Time: 07/17/17  4:40 PM  Result Value Ref Range Status   Specimen Description LEFT ANTECUBITAL  Final   Special Requests IN PEDIATRIC BOTTLE Blood Culture adequate volume  Final   Culture   Final    NO GROWTH 2 DAYS Performed at [facility], [place] [address] [place], [place] [address]    Report Status PENDING  Incomplete  Urine culture     Status: Abnormal   Collection Time: 07/17/17  7:55 PM  Result Value Ref Range Status   Specimen Description URINE, RANDOM  Final   Special Requests NONE   Final   Culture MULTIPLE SPECIES PRESENT, SUGGEST RECOLLECTION (A)  Final   Report Status 07/19/2017 FINAL  Final     Labs: Basic Metabolic Panel:  Recent Labs Lab 07/17/17 1634 07/18/17 0216 07/19/17 0302 07/20/17 0259  NA 131* 135 138 137  K 4.1 3.8 3.6 3.4*  CL 98* 107 110 110  CO2 21* 20* 19* 21*  GLUCOSE 385* 327* 269* 243*  BUN 11 6 <5* <5*  CREATININE 1.32* 1.00 0.93 0.77  CALCIUM 9.0 8.2* 8.1* 8.4*  MG  --  1.6* 1.6* 1.6*  PHOS  --  2.5  --   --    Liver Function Tests:  Recent Labs Lab 07/17/17 1634 07/18/17 0216 07/19/17 0302  AST _0 ALT _1 ALKPHOS 130* 104 83  BILITOT 0.5 0.5 0.4  PROT 8.0 6.6 5.6*  ALBUMIN 3.4* 2.8* 2.4*    Recent Labs Lab 07/17/17 1634  LIPASE 22   No results for input(s): AMMONIA in the last 168 hours. CBC:  Recent Labs Lab 07/17/17 1634 07/18/17 0216 07/19/17 0302 07/19/17 0824 07/19/17 2050 07/20/17 0259  WBC 11.2* 8.2 5.0  --   --  5.0  NEUTROABS 8.4*  --   --   --   --   --   HGB 9.5* 8.3* 6.7* 6.8* 9.3* 9.2*  HCT 28.8* 25.2* 20.2* 20.9* 28.0* 27.7*  MCV 75.4* 75.4* 74.5*  --   --  76.7*  PLT 195 154 150  --   --  144*   Cardiac Enzymes: No results for input(s): CKTOTAL, CKMB, CKMBINDEX, TROPONINI in the last 168 hours. BNP: BNP (last 3 results) No results for input(s): BNP in the last 8760 hours.  ProBNP (last 3 results) No results for input(s): PROBNP in the last 8760 hours.  CBG:  Recent Labs Lab 07/19/17 1207 07/19/17 1702 07/19/17 2135 07/20/17 0726 07/20/17 1201  GLUCAP 346* 156* 160* 176* 167*       Signed:  Dia Crawford, MD [facility] [phone] pager

## 2017-07-22 LAB — CULTURE, BLOOD (ROUTINE X 2)
CULTURE: NO GROWTH
CULTURE: NO GROWTH
SPECIAL REQUESTS: ADEQUATE
Special Requests: ADEQUATE

## 2017-11-14 ENCOUNTER — Other Ambulatory Visit (HOSPITAL_COMMUNITY): Payer: Self-pay | Admitting: Gastroenterology

## 2017-11-14 DIAGNOSIS — Z8619 Personal history of other infectious and parasitic diseases: Secondary | ICD-10-CM

## 2017-11-17 ENCOUNTER — Ambulatory Visit (HOSPITAL_COMMUNITY): Admission: RE | Admit: 2017-11-17 | Payer: Medicaid Other | Source: Ambulatory Visit

## 2017-11-22 ENCOUNTER — Ambulatory Visit: Payer: Self-pay | Admitting: Podiatry

## 2017-11-24 ENCOUNTER — Ambulatory Visit (HOSPITAL_COMMUNITY)
Admission: RE | Admit: 2017-11-24 | Discharge: 2017-11-24 | Disposition: A | Discharge status: Home / Self Care | Payer: Medicaid Other | Source: Ambulatory Visit | Attending: Gastroenterology | Admitting: Gastroenterology

## 2017-11-24 DIAGNOSIS — Z8619 Personal history of other infectious and parasitic diseases: Secondary | ICD-10-CM | POA: Diagnosis not present

## 2017-11-24 DIAGNOSIS — Z9049 Acquired absence of other specified parts of digestive tract: Secondary | ICD-10-CM | POA: Insufficient documentation

## 2018-01-08 ENCOUNTER — Other Ambulatory Visit: Payer: Self-pay | Admitting: Nurse Practitioner

## 2018-01-08 ENCOUNTER — Ambulatory Visit
Admission: RE | Admit: 2018-01-08 | Discharge: 2018-01-08 | Disposition: A | Discharge status: Home / Self Care | Payer: Medicaid Other | Source: Ambulatory Visit | Attending: Nurse Practitioner | Admitting: Nurse Practitioner

## 2018-01-08 DIAGNOSIS — M545 Low back pain: Secondary | ICD-10-CM

## 2018-05-09 ENCOUNTER — Ambulatory Visit
Admission: RE | Admit: 2018-05-09 | Discharge: 2018-05-09 | Disposition: A | Discharge status: Home / Self Care | Payer: Medicaid Other | Source: Ambulatory Visit | Attending: Nurse Practitioner | Admitting: Nurse Practitioner

## 2018-05-09 ENCOUNTER — Other Ambulatory Visit: Payer: Self-pay | Admitting: Nurse Practitioner

## 2018-05-09 DIAGNOSIS — M549 Dorsalgia, unspecified: Secondary | ICD-10-CM

## 2018-05-09 DIAGNOSIS — R0602 Shortness of breath: Secondary | ICD-10-CM

## 2018-07-09 ENCOUNTER — Other Ambulatory Visit: Payer: Self-pay | Admitting: Nurse Practitioner

## 2018-07-09 DIAGNOSIS — Z1231 Encounter for screening mammogram for malignant neoplasm of breast: Secondary | ICD-10-CM

## 2018-07-11 ENCOUNTER — Telehealth: Payer: Self-pay

## 2018-07-11 NOTE — Telephone Encounter (Signed)
Pt called stating she would like a referral for a mammogram. [phone] [facility]

## 2018-07-11 NOTE — Telephone Encounter (Signed)
Why does she need a referral? Is she having a problem?

## 2018-07-16 ENCOUNTER — Telehealth: Payer: Self-pay

## 2018-07-16 ENCOUNTER — Other Ambulatory Visit: Payer: Self-pay | Admitting: Nurse Practitioner

## 2018-07-16 NOTE — Telephone Encounter (Signed)
Pt stated she received a letter stating she was due for a mammogram and when she went to schedule an appt she was told she needed a referral. Pt stated she is having some soreness in her breast. Kara Kelley

## 2018-07-17 NOTE — Telephone Encounter (Signed)
Patient has been scheduled for 07/19/18 at 9:30am. Kara Kelley

## 2018-07-19 ENCOUNTER — Encounter: Payer: Self-pay | Admitting: Nurse Practitioner

## 2018-07-19 ENCOUNTER — Other Ambulatory Visit: Payer: Self-pay

## 2018-07-19 ENCOUNTER — Ambulatory Visit: Payer: Medicaid Other | Admitting: Nurse Practitioner

## 2018-07-19 VITALS — BP 170/90 | HR 68 | Temp 98.2°F | Ht 59.5 in | Wt 195.6 lb

## 2018-07-19 DIAGNOSIS — Z794 Long term (current) use of insulin: Secondary | ICD-10-CM

## 2018-07-19 DIAGNOSIS — I10 Essential (primary) hypertension: Secondary | ICD-10-CM

## 2018-07-19 DIAGNOSIS — N644 Mastodynia: Secondary | ICD-10-CM | POA: Diagnosis not present

## 2018-07-19 DIAGNOSIS — E11649 Type 2 diabetes mellitus with hypoglycemia without coma: Secondary | ICD-10-CM | POA: Diagnosis not present

## 2018-07-19 DIAGNOSIS — Z23 Encounter for immunization: Secondary | ICD-10-CM

## 2018-07-19 MED ORDER — ATORVASTATIN CALCIUM 10 MG PO TABS: 10.0000 mg | ORAL_TABLET | Freq: Every day | ORAL | 0 refills | Status: DC

## 2018-07-19 MED ORDER — SPIRONOLACTONE 50 MG PO TABS: 50.0000 mg | ORAL_TABLET | Freq: Every day | ORAL | 1 refills | Status: DC

## 2018-07-19 MED ORDER — LISINOPRIL 2.5 MG PO TABS: 2.5000 mg | ORAL_TABLET | Freq: Every day | ORAL | 1 refills | Status: DC

## 2018-07-19 MED ORDER — METFORMIN HCL 500 MG PO TABS: 500.0000 mg | ORAL_TABLET | Freq: Two times a day (BID) | ORAL | 2 refills | Status: DC

## 2018-07-19 MED ORDER — OMEPRAZOLE 40 MG PO CPDR: 40.0000 mg | DELAYED_RELEASE_CAPSULE | Freq: Every day | ORAL | 0 refills | Status: DC

## 2018-07-19 NOTE — Patient Instructions (Addendum)
   Will send an order for diagnostic mammogram  Will start Ozempic 0.25 mg for 4 weeks then increase to 0.5 mg for 2 weeks the initial pen will last for 6 weeks.    May take Tylenol as needed for fever/muscle aches  Continue with levemir at 40 units nightly  Novolog will be taken on a sliding scale given to you.

## 2018-07-19 NOTE — Progress Notes (Addendum)
Subjective:     Patient ID: Kara Kelley , female    DOB: [birth year] , 62 y.o.   MRN: [id number]   Breast pain - one week of breast pain to right side.  Throbbing pain.  No dishcarge, no lumps.  Denies lifting or pulling. She fell on her back 2 weeks ago.  She reports she just slipped down while walking.    Back Pain  This is a chronic (She is going to the pain clinic.  She is scheduled for an epidural on 07/24/18.) problem. The current episode started more than 1 year ago. The pain is present in the lumbar spine. The quality of the pain is described as aching. The pain does not radiate. Treatments tried: Pain clinic/ oxycodone  The treatment provided mild relief.  Diabetes  She presents for her follow-up diabetic visit. She has type 2 diabetes mellitus. (She reports episodes of low blood sugars down to 60, she is only taking novolog 18 units average twice a day. ) Pertinent negatives for diabetes include no fatigue, no polydipsia and no polyphagia. Pertinent negatives for diabetic complications include no nephropathy or peripheral neuropathy. There are no known risk factors for coronary artery disease. She is compliant with treatment most of the time. Diabetic current diet: Pain management is sending her to a nutritionistt. When asked about meal planning, she reported none. Prior visit with dietitian: referred by pain management. She rarely participates in exercise. Her home blood glucose trend is fluctuating minimally. An ACE inhibitor/angiotensin II receptor blocker is being taken. She does not see a podiatrist.Eye exam is not current (she is to schedule an appt).  Hypertension       Past Medical History:  Diagnosis Date  . Anemia   . Anxiety   . Arthritis   . Asthma   . Bunion   . Callus   . Chronic pain   . Cocaine abuse ([place])   . COPD (chronic obstructive pulmonary disease) ([place])   . Corns and callosities   . Degenerative joint disease   . Depression   . Diabetes mellitus   .  Endometrial polyp   . ETOH abuse   . Gall stones   . GERD (gastroesophageal reflux disease)   . Headache    history of Migraines  . Hepatitis C    Hep C  . Hyperlipidemia   . Hypertension   . Insomnia   . Spondylolisthesis of lumbar region   . Substance abuse ([place])    alcoholism  . Tuberculosis 1985  . Wears dentures   . Wears glasses       Current Outpatient Medications:  .  albuterol (PROVENTIL HFA;VENTOLIN HFA) 108 (90 BASE) MCG/ACT inhaler, Inhale 2 puffs into the lungs every 6 (six) hours as needed. For shortness of breath., Disp: , Rfl:  .  atorvastatin (LIPITOR) 10 MG tablet, Take 10 mg by mouth daily., Disp: , Rfl:  .  Blood Glucose Monitoring Suppl (ACCU-CHEK AVIVA PLUS) W/DEVICE KIT, 1 Device by Does not apply route once., Disp: 1 kit, Rfl: 0 .  buPROPion (WELLBUTRIN XL) 300 MG 24 hr tablet, Take 300 mg by mouth every morning., Disp: , Rfl: 0 .  glucose blood (ACCU-CHEK AVIVA) test strip, 1 each by Other route 2 (two) times daily. And lancets 2/day 250.43 (Patient taking differently: 1 each by Other route 3 (three) times daily. And lancets 2/day 250.43), Disp: 100 each, Rfl: 12 .  LEVEMIR FLEXTOUCH 100 UNIT/ML Pen, INJECT 40 UNITS UNDER THE SKIN  EVERY DAY AT BEDTIME, Disp: 30 mL, Rfl: 0 .  metFORMIN (GLUCOPHAGE) 500 MG tablet, Take 500 mg by mouth 3 (three) times daily with meals., Disp: , Rfl:  .  NOVOLOG FLEXPEN 100 UNIT/ML FlexPen, Inject 18 Units into the skin 3 (three) times daily as needed for high blood sugar. , Disp: , Rfl: 0 .  omeprazole (PRILOSEC) 40 MG capsule, Take 40 mg by mouth daily before breakfast. , Disp: , Rfl: 0 .  oxycodone (OXY-IR) 5 MG capsule, Take 5 mg by mouth every 4 (four) hours as needed., Disp: , Rfl:    Review of Systems  Constitutional: Negative.  Negative for fatigue.  HENT: Negative.   Respiratory: Negative.   Cardiovascular: Negative.   Endocrine: Negative for polydipsia and polyphagia.  Musculoskeletal: Positive for back pain.    Skin: Negative.      Today's Vitals   07/19/18 0911  BP: (!) 170/90  Pulse: 68  Temp: 98.2 F (36.8 C)  TempSrc: Oral  Weight: 195 lb 9.6 oz (88.7 kg)  Height: 4' 11.5" (1.511 m)   Body mass index is 38.85 kg/m.   Objective:  Physical Exam  Constitutional: She appears well-developed and well-nourished.  Morbid obesity  HENT:  Head: Normocephalic.  Cardiovascular: Normal rate, regular rhythm and normal heart sounds.  Pulmonary/Chest: Effort normal and breath sounds normal. She exhibits no tenderness. Right breast exhibits tenderness. Right breast exhibits no skin change. No breast swelling, tenderness or discharge. Breasts are asymmetrical.    Psychiatric: She has a normal mood and affect.        Assessment And Plan:     1. Breast pain, right  Right breast tenderness to inner upper quadrant.  - MM DIAG BREAST TOMO BILATERAL  2. Type 2 diabetes mellitus with hypoglycemia without coma, with long-term current use of insulin (HCC) Chronic, fair control Continue with current medications - CMP14 + Anion Gap - Hemoglobin A1c  3. Essential hypertension  Chronic, poorly controlled  Advised her to make sure she is taking her medications regularly.  Continue with current medications - spironolactone (ALDACTONE) 50 MG tablet; Take 1 tablet (50 mg total) by mouth daily.  Dispense: 90 tablet; Refill: 1 - lisinopril (ZESTRIL) 2.5 MG tablet; Take 1 tablet (2.5 mg total) by mouth daily.  Dispense: 90 tablet; Refill: [place], FNP

## 2018-07-20 ENCOUNTER — Encounter: Payer: Self-pay | Admitting: Nurse Practitioner

## 2018-07-20 DIAGNOSIS — R252 Cramp and spasm: Secondary | ICD-10-CM | POA: Insufficient documentation

## 2018-07-23 ENCOUNTER — Other Ambulatory Visit: Payer: Self-pay | Admitting: Nurse Practitioner

## 2018-07-23 DIAGNOSIS — N644 Mastodynia: Secondary | ICD-10-CM

## 2018-07-25 ENCOUNTER — Ambulatory Visit
Admission: RE | Admit: 2018-07-25 | Discharge: 2018-07-25 | Disposition: A | Discharge status: Home / Self Care | Payer: Medicaid Other | Source: Ambulatory Visit | Attending: Nurse Practitioner | Admitting: Nurse Practitioner

## 2018-07-25 DIAGNOSIS — N644 Mastodynia: Secondary | ICD-10-CM

## 2018-07-26 ENCOUNTER — Encounter: Payer: Self-pay | Admitting: Nurse Practitioner

## 2018-09-08 IMAGING — DX DG HIP (WITH OR WITHOUT PELVIS) 2-3V*R*
2 series · 2 of 2 positions shown · non-contrast
Comparison: 03/09/2015

CLINICAL DATA: Right leg pain

EXAM:
DG HIP (WITH OR WITHOUT PELVIS) 2-3V RIGHT

[dg hip unilat w or w/o pelvis 2-3 views  (1 of 2)]
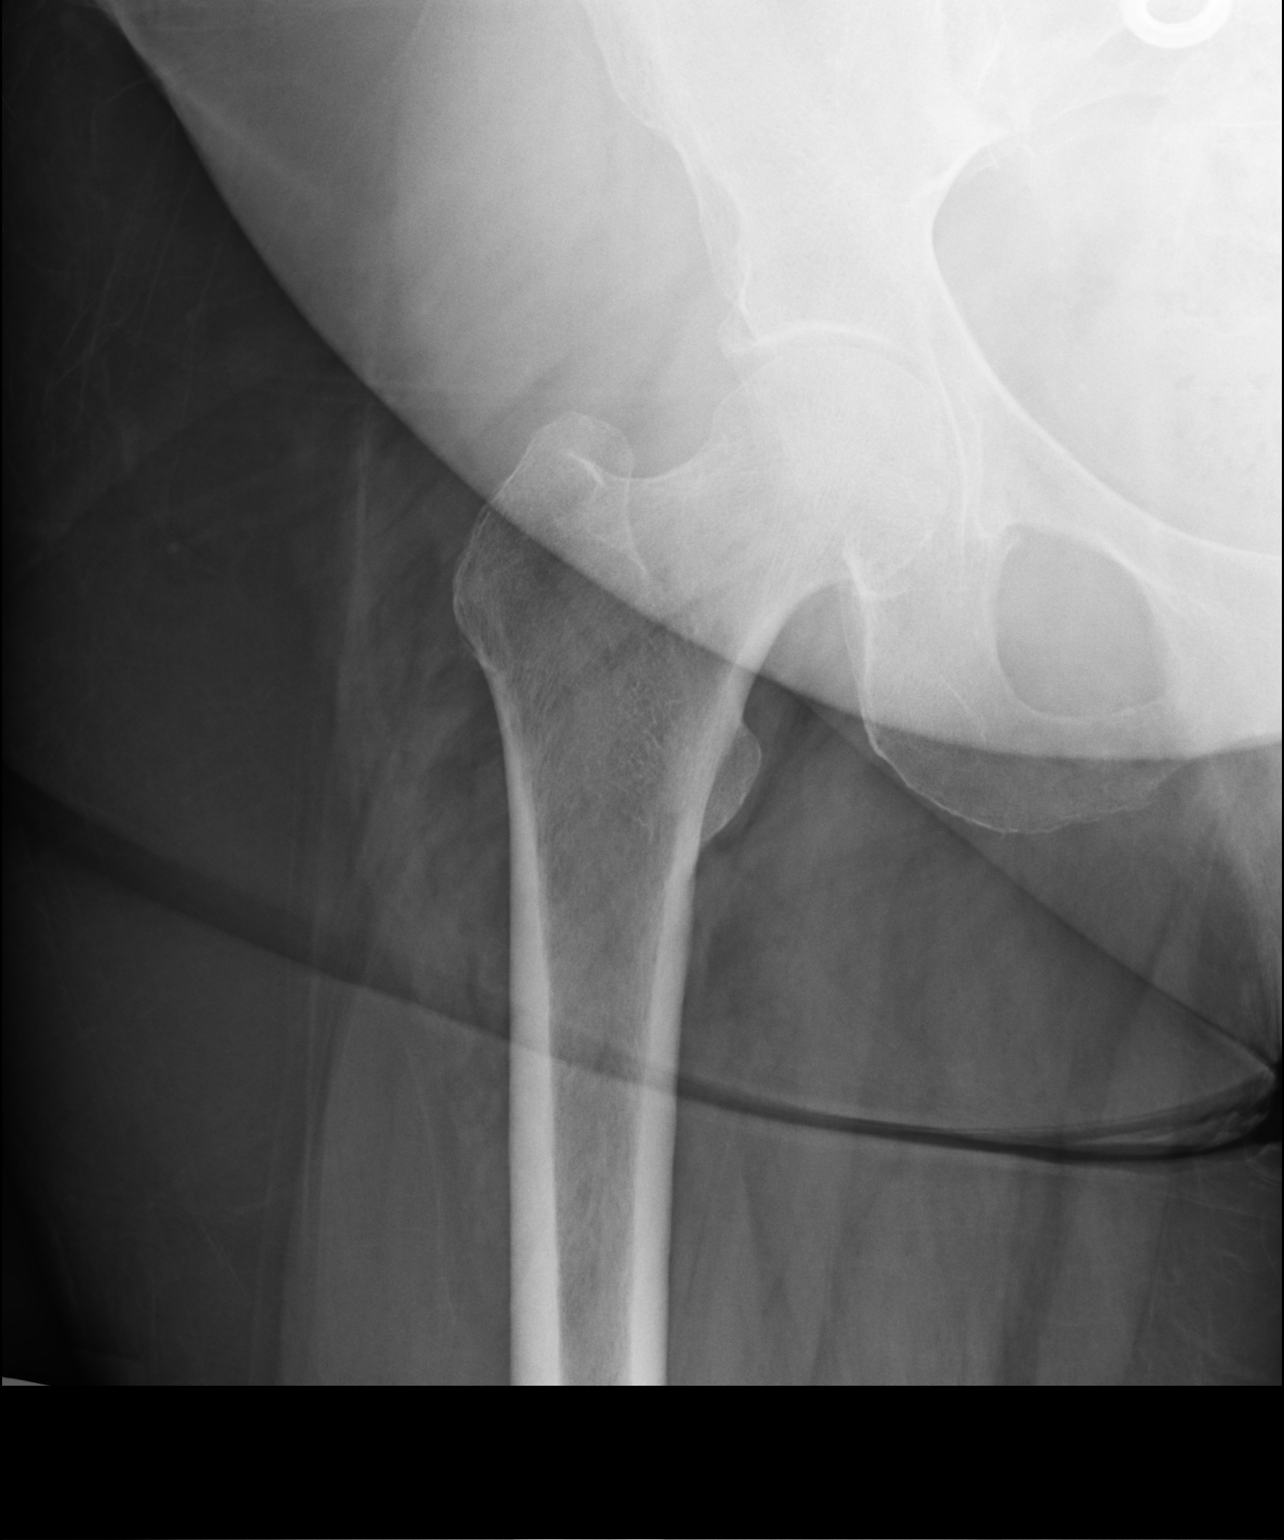

[dg hip unilat w or w/o pelvis 2-3 views  (2 of 2)]
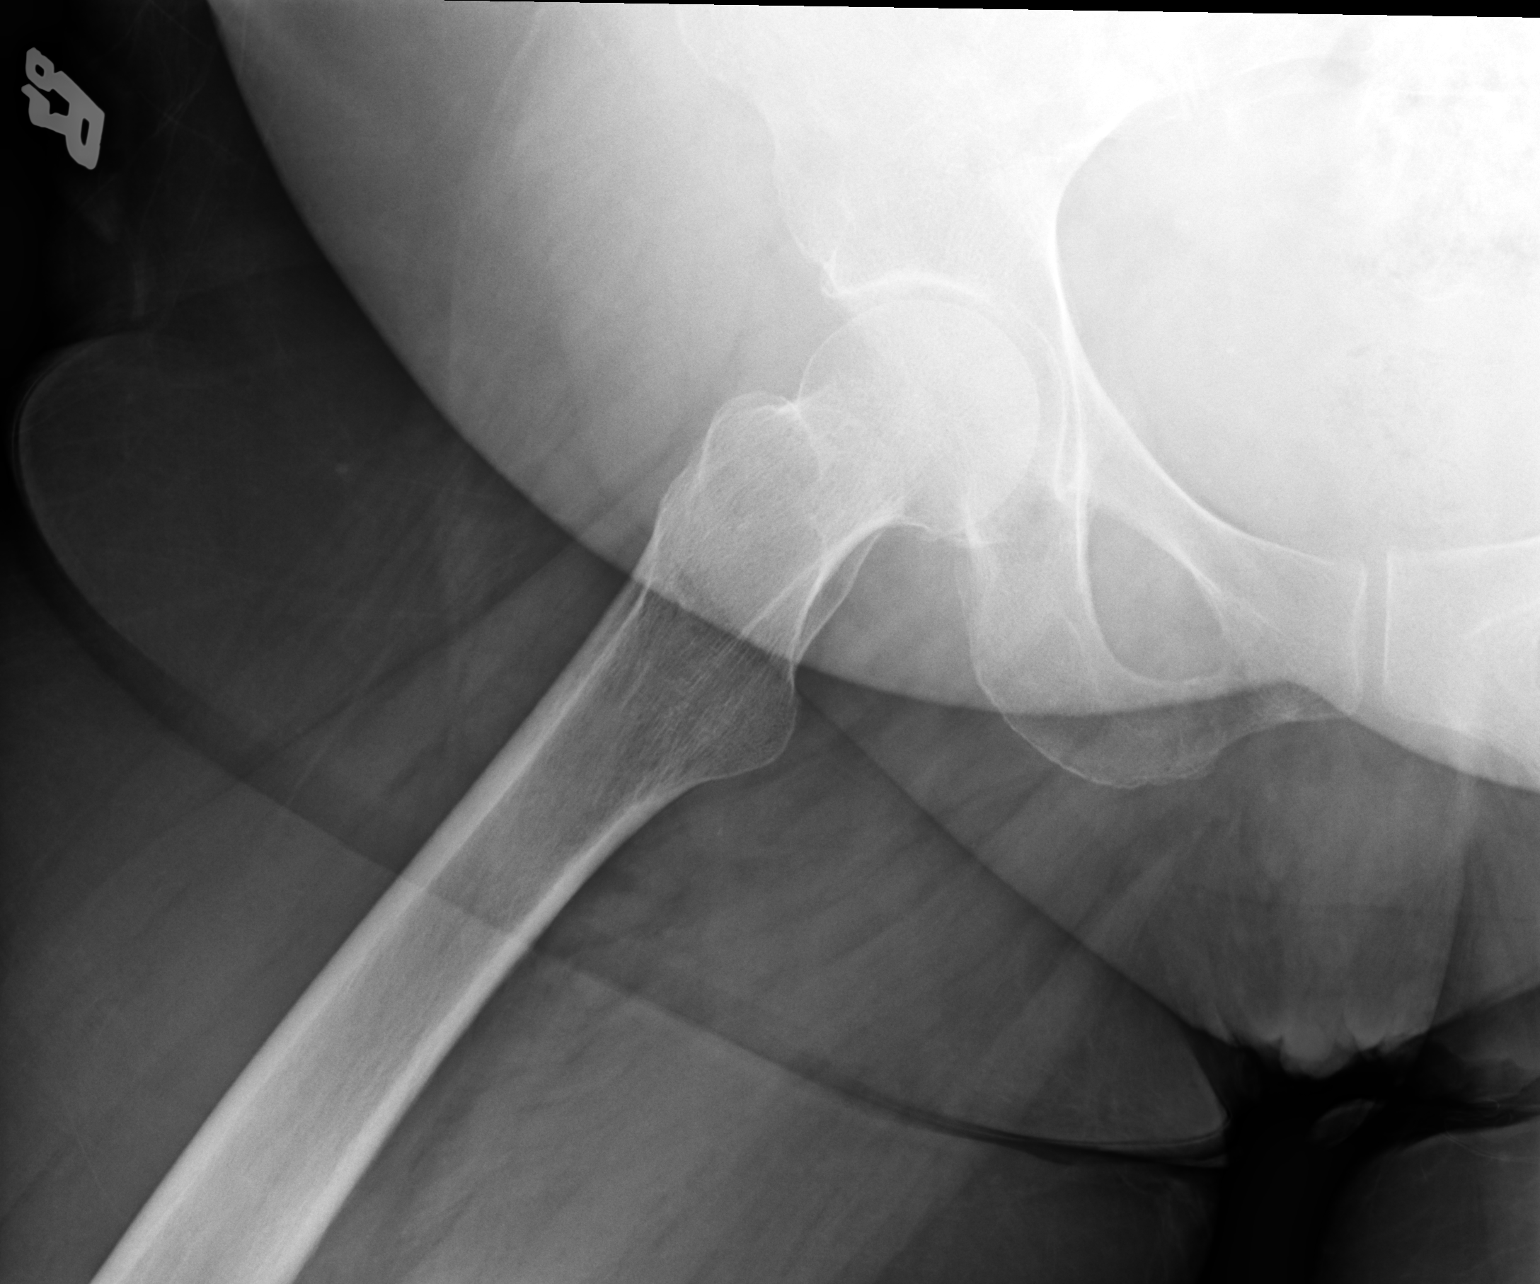

[2 of 2 positions shown; findings below may reference images not displayed]

FINDINGS: There is no evidence of hip fracture or dislocation. There is no
evidence of arthropathy or other focal bone abnormality.
IMPRESSION: Negative.

## 2018-09-11 ENCOUNTER — Telehealth: Payer: Self-pay | Admitting: Nurse Practitioner

## 2018-09-11 NOTE — Telephone Encounter (Signed)
PT STATED THAT SHE HAD MISSED CALL FROM OUR OFFICE, ADV HER WHERE I DID NOT SEE WHERE ANYONE HAD CONTACTED HER TODAY. PT OK

## 2018-10-01 ENCOUNTER — Other Ambulatory Visit: Payer: Self-pay | Admitting: Nurse Practitioner

## 2018-10-01 DIAGNOSIS — I10 Essential (primary) hypertension: Secondary | ICD-10-CM

## 2018-10-05 ENCOUNTER — Encounter: Payer: Self-pay | Admitting: Nurse Practitioner

## 2018-10-05 ENCOUNTER — Ambulatory Visit: Payer: Medicaid Other | Admitting: Nurse Practitioner

## 2018-10-05 VITALS — BP 140/80 | HR 70 | Temp 98.1°F | Ht 59.25 in | Wt 207.0 lb

## 2018-10-05 DIAGNOSIS — E11649 Type 2 diabetes mellitus with hypoglycemia without coma: Secondary | ICD-10-CM | POA: Diagnosis not present

## 2018-10-05 DIAGNOSIS — I10 Essential (primary) hypertension: Secondary | ICD-10-CM | POA: Insufficient documentation

## 2018-10-05 DIAGNOSIS — Z794 Long term (current) use of insulin: Secondary | ICD-10-CM | POA: Diagnosis not present

## 2018-10-05 DIAGNOSIS — J449 Chronic obstructive pulmonary disease, unspecified: Secondary | ICD-10-CM | POA: Insufficient documentation

## 2018-10-05 LAB — CMP14 + ANION GAP
A/G RATIO: 1.7 (ref 1.2–2.2)
ALK PHOS: 121 IU/L — AB (ref 39–117)
ALT: 11 IU/L (ref 0–32)
AST: 15 IU/L (ref 0–40)
Albumin: 4 g/dL (ref 3.6–4.8)
Anion Gap: 16 mmol/L (ref 10.0–18.0)
BILIRUBIN TOTAL: 0.2 mg/dL (ref 0.0–1.2)
BUN/Creatinine Ratio: 9 — ABNORMAL LOW (ref 12–28)
BUN: 10 mg/dL (ref 8–27)
CO2: 22 mmol/L (ref 20–29)
Calcium: 9.8 mg/dL (ref 8.7–10.3)
Chloride: 106 mmol/L (ref 96–106)
Creatinine, Ser: 1.12 mg/dL — ABNORMAL HIGH (ref 0.57–1.00)
GFR calc Af Amer: 62 mL/min/{1.73_m2} (ref >59)
GFR calc non Af Amer: 54 mL/min/{1.73_m2} — ABNORMAL LOW (ref >59)
GLOBULIN, TOTAL: 2.4 g/dL (ref 1.5–4.5)
Glucose: 103 mg/dL — ABNORMAL HIGH (ref 65–99)
POTASSIUM: 4.1 mmol/L (ref 3.5–5.2)
Sodium: 144 mmol/L (ref 134–144)
Total Protein: 6.4 g/dL (ref 6.0–8.5)

## 2018-10-05 LAB — CBC
Hematocrit: 35.5 % (ref 34.0–46.6)
Hemoglobin: 11.5 g/dL (ref 11.1–15.9)
MCH: 24.9 pg — ABNORMAL LOW (ref 26.6–33.0)
MCHC: 32.4 g/dL (ref 31.5–35.7)
MCV: 77 fL — ABNORMAL LOW (ref 79–97)
PLATELETS: 160 10*3/uL (ref 150–450)
RBC: 4.62 x10E6/uL (ref 3.77–5.28)
RDW: 15.1 % (ref 12.3–15.4)
WBC: 6.9 10*3/uL (ref 3.4–10.8)

## 2018-10-05 LAB — HEMOGLOBIN A1C
Est. average glucose Bld gHb Est-mCnc: 183 mg/dL
HEMOGLOBIN A1C: 8 % — AB (ref 4.8–5.6)

## 2018-10-05 MED ORDER — SPIRONOLACTONE 50 MG PO TABS: 50.0000 mg | ORAL_TABLET | Freq: Every day | ORAL | 1 refills | Status: DC

## 2018-10-05 MED ORDER — ATORVASTATIN CALCIUM 10 MG PO TABS: 10.0000 mg | ORAL_TABLET | Freq: Every day | ORAL | 0 refills | Status: DC

## 2018-10-05 MED ORDER — GLUCOSE BLOOD VI STRP: ORAL_STRIP | 3 refills | Status: DC

## 2018-10-05 MED ORDER — ALBUTEROL SULFATE HFA 108 (90 BASE) MCG/ACT IN AERS: 2.0000 | INHALATION_SPRAY | Freq: Four times a day (QID) | RESPIRATORY_TRACT | 1 refills | Status: DC | PRN

## 2018-10-05 MED ORDER — METFORMIN HCL 500 MG PO TABS: 500.0000 mg | ORAL_TABLET | Freq: Two times a day (BID) | ORAL | 2 refills | Status: DC

## 2018-10-05 MED ORDER — NOVOLOG FLEXPEN 100 UNIT/ML ~~LOC~~ SOPN: 18.0000 [IU] | PEN_INJECTOR | Freq: Three times a day (TID) | SUBCUTANEOUS | 0 refills | Status: DC | PRN

## 2018-10-05 MED ORDER — PEN NEEDLES 32G X 4 MM MISC: 1.0000 | Freq: Three times a day (TID) | 3 refills | Status: DC

## 2018-10-05 MED ORDER — OMEPRAZOLE 40 MG PO CPDR: 40.0000 mg | DELAYED_RELEASE_CAPSULE | Freq: Every day | ORAL | 0 refills | Status: DC

## 2018-10-05 MED ORDER — INSULIN DETEMIR 100 UNIT/ML FLEXPEN: 40.0000 [IU] | PEN_INJECTOR | Freq: Every day | SUBCUTANEOUS | 1 refills | Status: DC

## 2018-10-05 MED ORDER — ACCU-CHEK NANO SMARTVIEW W/DEVICE KIT: 1.0000 | PACK | Freq: Three times a day (TID) | 0 refills | Status: DC

## 2018-10-05 NOTE — Progress Notes (Signed)
Subjective:     Patient ID: Kara Kelley , female    DOB: 11/21/1955 , 62 y.o.   MRN: [id number]   Chief Complaint  Patient presents with  . Diabetes  . Hypertension    HPI  She is now living with her sister.  She is going to [place] for 2 months.    Diabetes  She presents for her follow-up diabetic visit. She has type 2 diabetes mellitus. Her disease course has been stable. Pertinent negatives for hypoglycemia include no confusion, dizziness, headaches, nervousness/anxiousness or sleepiness. There are no diabetic associated symptoms. Pertinent negatives for diabetes include no blurred vision, no chest pain, no polydipsia, no polyphagia and no polyuria. There are no hypoglycemic complications. Symptoms are stable. Risk factors for coronary artery disease include obesity and sedentary lifestyle. She is compliant with treatment all of the time. Her weight is stable. She is following a generally unhealthy diet. When asked about meal planning, she reported none. She has not had a previous visit with a dietitian. She never participates in exercise. There is no change in her home blood glucose trend. Her overall blood glucose range is 70-90 mg/dl. An ACE inhibitor/angiotensin II receptor blocker is being taken. She does not see a podiatrist.Eye exam is not current (she has an appt next week. ).  Hypertension  Pertinent negatives include no blurred vision, chest pain, headaches or palpitations.     Past Medical History:  Diagnosis Date  . Anemia   . Anxiety   . Arthritis   . Asthma   . Bunion   . Callus   . Chronic pain   . Cocaine abuse ([place])   . COPD (chronic obstructive pulmonary disease) ([place])   . Corns and callosities   . Degenerative joint disease   . Depression   . Diabetes mellitus   . Endometrial polyp   . ETOH abuse   . Gall stones   . GERD (gastroesophageal reflux disease)   . Headache    history of Migraines  . Hepatitis C    Hep C  . Hyperlipidemia   .  Hypertension   . Insomnia   . Spondylolisthesis of lumbar region   . Substance abuse ([place])    alcoholism  . Tuberculosis 1985  . Wears dentures   . Wears glasses      Family History  Problem Relation Age of Onset  . Heart disease Mother   . Diabetes Other        mat great aunt  . Cirrhosis Other        mat great aunt  . Cirrhosis Other        mat great uncles x 2  . Colon cancer Neg Hx   . Breast cancer Neg Hx      Current Outpatient Medications:  .  albuterol (PROVENTIL HFA;VENTOLIN HFA) 108 (90 BASE) MCG/ACT inhaler, Inhale 2 puffs into the lungs every 6 (six) hours as needed. For shortness of breath., Disp: , Rfl:  .  ARIPiprazole (ABILIFY) 5 MG tablet, Take 5 mg by mouth daily., Disp: , Rfl:  .  atorvastatin (LIPITOR) 10 MG tablet, Take 1 tablet (10 mg total) by mouth daily., Disp: 90 tablet, Rfl: 0 .  Blood Glucose Monitoring Suppl (ACCU-CHEK AVIVA PLUS) W/DEVICE KIT, 1 Device by Does not apply route once., Disp: 1 kit, Rfl: 0 .  Blood Glucose Monitoring Suppl (ACCU-CHEK NANO SMARTVIEW) w/Device KIT, by Does not apply route. Check blood sugars 2 times daily before breakfast and dinner,  Disp: , Rfl:  .  buPROPion (WELLBUTRIN XL) 300 MG 24 hr tablet, Take 300 mg by mouth every morning., Disp: , Rfl: 0 .  glucose blood (ACCU-CHEK AVIVA) test strip, 1 each by Other route 2 (two) times daily. And lancets 2/day 250.43 (Patient taking differently: 1 each by Other route 3 (three) times daily. And lancets 2/day 250.43), Disp: 100 each, Rfl: 12 .  LEVEMIR FLEXTOUCH 100 UNIT/ML Pen, INJECT 40 UNITS UNDER THE SKIN EVERY DAY AT BEDTIME, Disp: 30 mL, Rfl: 0 .  lisinopril (PRINIVIL,ZESTRIL) 2.5 MG tablet, TAKE 1 TABLET BY MOUTH EVERY DAY, Disp: 90 tablet, Rfl: 1 .  meloxicam (MOBIC) 15 MG tablet, TK 1 T PO QD, Disp: , Rfl: 0 .  metFORMIN (GLUCOPHAGE) 500 MG tablet, Take 1 tablet (500 mg total) by mouth 2 (two) times daily with a meal., Disp: 60 tablet, Rfl: 2 .  NOVOLOG FLEXPEN 100 UNIT/ML  FlexPen, Inject 18 Units into the skin 3 (three) times daily as needed for high blood sugar. , Disp: , Rfl: 0 .  omeprazole (PRILOSEC) 40 MG capsule, Take 1 capsule (40 mg total) by mouth daily before breakfast., Disp: 90 capsule, Rfl: 0 .  oxycodone (OXY-IR) 5 MG capsule, Take 5 mg by mouth every 4 (four) hours as needed., Disp: , Rfl:  .  spironolactone (ALDACTONE) 50 MG tablet, Take 1 tablet (50 mg total) by mouth daily., Disp: 90 tablet, Rfl: 1 .  traZODone (DESYREL) 100 MG tablet, Take 100 mg by mouth at bedtime., Disp: , Rfl:    No Known Allergies   Review of Systems  Eyes: Negative.  Negative for blurred vision.  Respiratory: Negative.   Cardiovascular: Negative.  Negative for chest pain, palpitations and leg swelling.  Gastrointestinal: Negative.   Endocrine: Negative.  Negative for polydipsia, polyphagia and polyuria.  Musculoskeletal: Negative.   Skin: Negative.   Neurological: Negative.  Negative for dizziness and headaches.  Psychiatric/Behavioral: Negative for confusion. The patient is not nervous/anxious.      Today's Vitals   10/05/18 1029  BP: 140/80  Pulse: 70  Temp: 98.1 F (36.7 C)  TempSrc: Oral  SpO2: 97%  Weight: 207 lb (93.9 kg)  Height: 4' 11.25" (1.505 m)   Body mass index is 41.46 kg/m.   Objective:  Physical Exam Constitutional:      Appearance: She is well-developed.  Neck:     Musculoskeletal: Normal range of motion and neck supple.  Cardiovascular:     Rate and Rhythm: Normal rate and regular rhythm.     Heart sounds: Normal heart sounds. No murmur.  Pulmonary:     Effort: Pulmonary effort is normal.     Breath sounds: Normal breath sounds.  Chest:     Chest wall: No tenderness.  Musculoskeletal: Normal range of motion.  Skin:    General: Skin is warm and dry.     Capillary Refill: Capillary refill takes less than 2 seconds.  Neurological:     General: No focal deficit present.     Mental Status: She is alert and oriented to person,  place, and time.  Psychiatric:        Mood and Affect: Mood normal.         Assessment And Plan:     1. Type 2 diabetes mellitus with hypoglycemia without coma, with long-term current use of insulin (HCC)  Chronic, improving, fair control  Continue with current medications  Encouraged to limit intake of sugary foods and drinks  Encouraged to increase  physical activity to 150 minutes per week - Hemoglobin A1c - Lipid panel  2. Essential hypertension . B/P is controlled.  [name] BMP ordered to check renal function.  . The importance of regular exercise and dietary modification was stressed to the patient.  . Stressed importance of losing ten percent of her body weight to help with B/P control.  . The weight loss would help with decreasing cardiac and cancer risk as well.  - CMP14 + Anion Gap - CBC  3. Chronic obstructive pulmonary disease, unspecified COPD type ([place])  Fair control.   Refilled her albuterol inhaler if symptoms worsen go to urgent care or ER.   No wheezing noted on exam       [name], FNP

## 2018-10-06 LAB — LIPID PANEL
Chol/HDL Ratio: 3 ratio (ref 0.0–4.4)
Cholesterol, Total: 152 mg/dL (ref 100–199)
HDL: 51 mg/dL (ref >39)
LDL Calculated: 84 mg/dL (ref 0–99)
Triglycerides: 84 mg/dL (ref 0–149)
VLDL Cholesterol Cal: 17 mg/dL (ref 5–40)

## 2018-10-22 ENCOUNTER — Ambulatory Visit: Payer: Medicaid Other | Admitting: Nurse Practitioner

## 2018-11-19 ENCOUNTER — Encounter: Payer: Self-pay | Admitting: Physical Therapy

## 2018-11-19 ENCOUNTER — Ambulatory Visit: Payer: Medicaid Other | Attending: Student | Admitting: Physical Therapy

## 2018-11-19 ENCOUNTER — Other Ambulatory Visit: Payer: Self-pay

## 2018-11-19 DIAGNOSIS — M62838 Other muscle spasm: Secondary | ICD-10-CM | POA: Insufficient documentation

## 2018-11-19 DIAGNOSIS — R293 Abnormal posture: Secondary | ICD-10-CM | POA: Diagnosis present

## 2018-11-19 DIAGNOSIS — M542 Cervicalgia: Secondary | ICD-10-CM | POA: Diagnosis present

## 2018-11-19 DIAGNOSIS — G44221 Chronic tension-type headache, intractable: Secondary | ICD-10-CM | POA: Insufficient documentation

## 2018-11-19 NOTE — Therapy (Addendum)
[place], [place], [address] Phone: [phone]   Fax:  [phone]  Physical Therapy Evaluation / Discharge  Patient Details  Name: Kara Kelley MRN: [id number] Date of Birth: March 17, 1956 Referring Provider (PT): Viona Gilmore D, NP   Encounter Date: 11/19/2018  PT End of Session - 11/19/18 1014    Visit Number  1    Number of Visits  13    Date for PT Re-Evaluation  01/14/19    Authorization Type  MCD: [place] at 4th visit    PT Start Time  1015    PT Stop Time  1048    PT Time Calculation (min)  33 min    Activity Tolerance  Patient tolerated treatment well    Behavior During Therapy  [facility] for tasks assessed/performed       Past Medical History:  Diagnosis Date  . Anemia   . Anxiety   . Arthritis   . Asthma   . Bunion   . Callus   . Chronic pain   . Cocaine abuse ([place])   . COPD (chronic obstructive pulmonary disease) ([place])   . Corns and callosities   . Degenerative joint disease   . Depression   . Diabetes mellitus   . Endometrial polyp   . ETOH abuse   . Gall stones   . GERD (gastroesophageal reflux disease)   . Headache    history of Migraines  . Hepatitis C    Hep C  . Hyperlipidemia   . Hypertension   . Insomnia   . Spondylolisthesis of lumbar region   . Substance abuse ([place])    alcoholism  . Tuberculosis 1985  . Wears dentures   . Wears glasses     Past Surgical History:  Procedure Laterality Date  . CESAREAN SECTION     x3  . CHOLECYSTECTOMY N/A 08/11/2016   Procedure: LAPAROSCOPIC CHOLECYSTECTOMY WITH   INTRAOPERATIVE CHOLANGIOGRAM;  Surgeon: Stark Klein, MD;  Location: [facility];  Service: General;  Laterality: N/A;  . COLONOSCOPY    . Cotton Osteotomy w/ Graft Left 06/18/2009  . Excision of Benign Lesion Right 01/30/2013   Rt Plantar  . FOOT SURGERY    . HAMMER TOE REPAIR Right 02/12/2016   RIGHT #5  . Hammertoe Repair Left 06/18/2009   Lt #5  .  HYSTEROSCOPY N/A 06/20/2017   Procedure: DILATION AND CURETTAGE, HYSTEROSCOPY w/ Polypectomy;  Surgeon: Lavonia Drafts, MD;  Location: [facility];  Service: Gynecology;  Laterality: N/A;  . LUMBAR FUSION  2018  . METATARSAL OSTEOTOMY Left 06/18/2009   #5  . MULTIPLE TOOTH EXTRACTIONS    . Nail Matrixectomy Left 06/18/2009   LT #1  . OSTEOTOMY Right 01/30/2013   Rt #5  . Phalangectomy Left 06/18/2009   LT #1  . Phalangectomy Right 01/30/2013   Rt #1  . TUBAL LIGATION      There were no vitals filed for this visit.   Subjective Assessment - 11/19/18 1019    Subjective  pt is a 63 y.o F with CC of neck and bil shoulder pain with referral down the back. pain started about a month ago with no specific onset. pain is referred to the R>L, since onset the pain seems to be worsening. she reportes HA that have strated since this began. pt notes hx or a pinched nerve in the neck that had resolved a couple years ago.     Limitations  Standing;Sitting;Walking    How  [place], [place], [address] Phone: [phone]   Fax:  [phone]  Physical Therapy Evaluation / Discharge  Patient Details  Name: Kara Kelley MRN: [id number] Date of Birth: March 17, 1956 Referring Provider (PT): Viona Gilmore D, NP   Encounter Date: 11/19/2018  PT End of Session - 11/19/18 1014    Visit Number  1    Number of Visits  13    Date for PT Re-Evaluation  01/14/19    Authorization Type  MCD: [place] at 4th visit    PT Start Time  1015    PT Stop Time  1048    PT Time Calculation (min)  33 min    Activity Tolerance  Patient tolerated treatment well    Behavior During Therapy  [facility] for tasks assessed/performed       Past Medical History:  Diagnosis Date  . Anemia   . Anxiety   . Arthritis   . Asthma   . Bunion   . Callus   . Chronic pain   . Cocaine abuse ([place])   . COPD (chronic obstructive pulmonary disease) ([place])   . Corns and callosities   . Degenerative joint disease   . Depression   . Diabetes mellitus   . Endometrial polyp   . ETOH abuse   . Gall stones   . GERD (gastroesophageal reflux disease)   . Headache    history of Migraines  . Hepatitis C    Hep C  . Hyperlipidemia   . Hypertension   . Insomnia   . Spondylolisthesis of lumbar region   . Substance abuse ([place])    alcoholism  . Tuberculosis 1985  . Wears dentures   . Wears glasses     Past Surgical History:  Procedure Laterality Date  . CESAREAN SECTION     x3  . CHOLECYSTECTOMY N/A 08/11/2016   Procedure: LAPAROSCOPIC CHOLECYSTECTOMY WITH   INTRAOPERATIVE CHOLANGIOGRAM;  Surgeon: Stark Klein, MD;  Location: [facility];  Service: General;  Laterality: N/A;  . COLONOSCOPY    . Cotton Osteotomy w/ Graft Left 06/18/2009  . Excision of Benign Lesion Right 01/30/2013   Rt Plantar  . FOOT SURGERY    . HAMMER TOE REPAIR Right 02/12/2016   RIGHT #5  . Hammertoe Repair Left 06/18/2009   Lt #5  .  HYSTEROSCOPY N/A 06/20/2017   Procedure: DILATION AND CURETTAGE, HYSTEROSCOPY w/ Polypectomy;  Surgeon: Lavonia Drafts, MD;  Location: [facility];  Service: Gynecology;  Laterality: N/A;  . LUMBAR FUSION  2018  . METATARSAL OSTEOTOMY Left 06/18/2009   #5  . MULTIPLE TOOTH EXTRACTIONS    . Nail Matrixectomy Left 06/18/2009   LT #1  . OSTEOTOMY Right 01/30/2013   Rt #5  . Phalangectomy Left 06/18/2009   LT #1  . Phalangectomy Right 01/30/2013   Rt #1  . TUBAL LIGATION      There were no vitals filed for this visit.   Subjective Assessment - 11/19/18 1019    Subjective  pt is a 63 y.o F with CC of neck and bil shoulder pain with referral down the back. pain started about a month ago with no specific onset. pain is referred to the R>L, since onset the pain seems to be worsening. she reportes HA that have strated since this began. pt notes hx or a pinched nerve in the neck that had resolved a couple years ago.     Limitations  Standing;Sitting;Walking    How  [place], [place], [address] Phone: [phone]   Fax:  [phone]  Physical Therapy Evaluation / Discharge  Patient Details  Name: Kara Kelley MRN: [id number] Date of Birth: March 17, 1956 Referring Provider (PT): Viona Gilmore D, NP   Encounter Date: 11/19/2018  PT End of Session - 11/19/18 1014    Visit Number  1    Number of Visits  13    Date for PT Re-Evaluation  01/14/19    Authorization Type  MCD: [place] at 4th visit    PT Start Time  1015    PT Stop Time  1048    PT Time Calculation (min)  33 min    Activity Tolerance  Patient tolerated treatment well    Behavior During Therapy  [facility] for tasks assessed/performed       Past Medical History:  Diagnosis Date  . Anemia   . Anxiety   . Arthritis   . Asthma   . Bunion   . Callus   . Chronic pain   . Cocaine abuse ([place])   . COPD (chronic obstructive pulmonary disease) ([place])   . Corns and callosities   . Degenerative joint disease   . Depression   . Diabetes mellitus   . Endometrial polyp   . ETOH abuse   . Gall stones   . GERD (gastroesophageal reflux disease)   . Headache    history of Migraines  . Hepatitis C    Hep C  . Hyperlipidemia   . Hypertension   . Insomnia   . Spondylolisthesis of lumbar region   . Substance abuse ([place])    alcoholism  . Tuberculosis 1985  . Wears dentures   . Wears glasses     Past Surgical History:  Procedure Laterality Date  . CESAREAN SECTION     x3  . CHOLECYSTECTOMY N/A 08/11/2016   Procedure: LAPAROSCOPIC CHOLECYSTECTOMY WITH   INTRAOPERATIVE CHOLANGIOGRAM;  Surgeon: Stark Klein, MD;  Location: [facility];  Service: General;  Laterality: N/A;  . COLONOSCOPY    . Cotton Osteotomy w/ Graft Left 06/18/2009  . Excision of Benign Lesion Right 01/30/2013   Rt Plantar  . FOOT SURGERY    . HAMMER TOE REPAIR Right 02/12/2016   RIGHT #5  . Hammertoe Repair Left 06/18/2009   Lt #5  .  HYSTEROSCOPY N/A 06/20/2017   Procedure: DILATION AND CURETTAGE, HYSTEROSCOPY w/ Polypectomy;  Surgeon: Lavonia Drafts, MD;  Location: [facility] [place] [facility];  Service: Gynecology;  Laterality: N/A;  . LUMBAR FUSION  2018  . METATARSAL OSTEOTOMY Left 06/18/2009   #5  . MULTIPLE TOOTH EXTRACTIONS    . Nail Matrixectomy Left 06/18/2009   LT #1  . OSTEOTOMY Right 01/30/2013   Rt #5  . Phalangectomy Left 06/18/2009   LT #1  . Phalangectomy Right 01/30/2013   Rt #1  . TUBAL LIGATION      There were no vitals filed for this visit.   Subjective Assessment - 11/19/18 1019    Subjective  pt is a 63 y.o F with CC of neck and bil shoulder pain with referral down the back. pain started about a month ago with no specific onset. pain is referred to the R>L, since onset the pain seems to be worsening. she reportes HA that have strated since this began. pt notes hx or a pinched nerve in the neck that had resolved a couple years ago.     Limitations  Standing;Sitting;Walking    How  restricitons, Decreased strength, Impaired UE functional use  Visit Diagnosis: Cervicalgia  Other muscle spasm  Chronic tension-type headache, intractable  Abnormal posture     Problem List Patient Active Problem List   Diagnosis Date Noted  . Essential hypertension 10/05/2018  . Chronic obstructive pulmonary disease ([place]) 10/05/2018  . Cramp and spasm 07/20/2018  . Lactic acidosis   . SIRS (systemic inflammatory response syndrome) ([facility])   . Acute renal failure ([place])   . Diabetes mellitus type 2, uncontrolled, with complications ([place])   . Acute blood loss anemia   . Sepsis ([place]) 07/17/2017  .  Hyponatremia 07/17/2017  . Postoperative wound infection 07/17/2017  . AKI (acute kidney injury) ([place]) 07/17/2017  . Dehydration 07/17/2017  . DM type 2 (diabetes mellitus, type 2) ([place]) 07/17/2017  . Acute encephalopathy 07/17/2017  . Degenerative spondylolisthesis 07/03/2017  . Post-menopausal bleeding 06/20/2017  . Chronic hepatitis C without hepatic coma ([place]) 12/09/2015  . Inclusion cyst 09/16/2014  . Onychomycosis 08/15/2014  . Type I (juvenile type) diabetes mellitus with renal manifestations, not stated as uncontrolled(250.41) 06/10/2014  . Unspecified constipation 05/22/2014  . Esophageal reflux 05/22/2014  . Pain in lower limb 01/29/2014  . Eschar of multiple sites 07/31/2013  . Porokeratosis 07/02/2013  . Cyst of joint of ankle or foot 06/04/2013  . History of endometrial ablation 02/06/2013  . Pain in joint, ankle and foot 01/23/2013  . Callus of foot 01/23/2013  . Ingrown nail 01/23/2013  . Deformity of metatarsal 01/23/2013   Starr Lake PT, DPT, LAT, ATC  11/19/18  11:00 AM      [facility] [address] [place], [place], [address] Phone: [phone]   Fax:  [phone]  Name: Kara Kelley MRN: [id number] Date of Birth: [birth year]     PHYSICAL THERAPY DISCHARGE SUMMARY  Visits from Start of Care: 1  Current functional level related to goals / functional outcomes: See goals   Remaining deficits: Pt is unable to attend physical therapy due to heading to [place] for a couple of months.    Education / Equipment: HEP,  Plan: Patient agrees to discharge.  Patient goals were not met. Patient is being discharged due to the patient's request.  ?????    Lorelai Huyser PT, DPT, LAT, ATC  12/07/18  12:21 PM  [place], [place], [address] Phone: [phone]   Fax:  [phone]  Physical Therapy Evaluation / Discharge  Patient Details  Name: Kara Kelley MRN: [id number] Date of Birth: March 17, 1956 Referring Provider (PT): Viona Gilmore D, NP   Encounter Date: 11/19/2018  PT End of Session - 11/19/18 1014    Visit Number  1    Number of Visits  13    Date for PT Re-Evaluation  01/14/19    Authorization Type  MCD: [place] at 4th visit    PT Start Time  1015    PT Stop Time  1048    PT Time Calculation (min)  33 min    Activity Tolerance  Patient tolerated treatment well    Behavior During Therapy  [facility] for tasks assessed/performed       Past Medical History:  Diagnosis Date  . Anemia   . Anxiety   . Arthritis   . Asthma   . Bunion   . Callus   . Chronic pain   . Cocaine abuse ([place])   . COPD (chronic obstructive pulmonary disease) ([place])   . Corns and callosities   . Degenerative joint disease   . Depression   . Diabetes mellitus   . Endometrial polyp   . ETOH abuse   . Gall stones   . GERD (gastroesophageal reflux disease)   . Headache    history of Migraines  . Hepatitis C    Hep C  . Hyperlipidemia   . Hypertension   . Insomnia   . Spondylolisthesis of lumbar region   . Substance abuse ([place])    alcoholism  . Tuberculosis 1985  . Wears dentures   . Wears glasses     Past Surgical History:  Procedure Laterality Date  . CESAREAN SECTION     x3  . CHOLECYSTECTOMY N/A 08/11/2016   Procedure: LAPAROSCOPIC CHOLECYSTECTOMY WITH   INTRAOPERATIVE CHOLANGIOGRAM;  Surgeon: Stark Klein, MD;  Location: [facility];  Service: General;  Laterality: N/A;  . COLONOSCOPY    . Cotton Osteotomy w/ Graft Left 06/18/2009  . Excision of Benign Lesion Right 01/30/2013   Rt Plantar  . FOOT SURGERY    . HAMMER TOE REPAIR Right 02/12/2016   RIGHT #5  . Hammertoe Repair Left 06/18/2009   Lt #5  .  HYSTEROSCOPY N/A 06/20/2017   Procedure: DILATION AND CURETTAGE, HYSTEROSCOPY w/ Polypectomy;  Surgeon: Lavonia Drafts, MD;  Location: [facility];  Service: Gynecology;  Laterality: N/A;  . LUMBAR FUSION  2018  . METATARSAL OSTEOTOMY Left 06/18/2009   #5  . MULTIPLE TOOTH EXTRACTIONS    . Nail Matrixectomy Left 06/18/2009   LT #1  . OSTEOTOMY Right 01/30/2013   Rt #5  . Phalangectomy Left 06/18/2009   LT #1  . Phalangectomy Right 01/30/2013   Rt #1  . TUBAL LIGATION      There were no vitals filed for this visit.   Subjective Assessment - 11/19/18 1019    Subjective  pt is a 63 y.o F with CC of neck and bil shoulder pain with referral down the back. pain started about a month ago with no specific onset. pain is referred to the R>L, since onset the pain seems to be worsening. she reportes HA that have strated since this began. pt notes hx or a pinched nerve in the neck that had resolved a couple years ago.     Limitations  Standing;Sitting;Walking    How

## 2018-11-20 ENCOUNTER — Other Ambulatory Visit: Payer: Self-pay | Admitting: Nurse Practitioner

## 2018-11-27 ENCOUNTER — Encounter: Payer: Self-pay | Admitting: Internal Medicine

## 2018-11-27 ENCOUNTER — Ambulatory Visit (INDEPENDENT_AMBULATORY_CARE_PROVIDER_SITE_OTHER): Payer: Medicaid Other | Admitting: Internal Medicine

## 2018-11-27 ENCOUNTER — Other Ambulatory Visit: Payer: Self-pay

## 2018-11-27 VITALS — BP 160/90 | HR 88 | Temp 98.2°F | Ht 59.8 in | Wt 216.4 lb

## 2018-11-27 DIAGNOSIS — R51 Headache: Secondary | ICD-10-CM

## 2018-11-27 DIAGNOSIS — M62838 Other muscle spasm: Secondary | ICD-10-CM | POA: Diagnosis not present

## 2018-11-27 DIAGNOSIS — M542 Cervicalgia: Secondary | ICD-10-CM | POA: Diagnosis not present

## 2018-11-27 DIAGNOSIS — M25511 Pain in right shoulder: Secondary | ICD-10-CM | POA: Diagnosis not present

## 2018-11-27 DIAGNOSIS — R519 Headache, unspecified: Secondary | ICD-10-CM

## 2018-11-27 DIAGNOSIS — I10 Essential (primary) hypertension: Secondary | ICD-10-CM

## 2018-11-27 MED ORDER — KETOROLAC TROMETHAMINE 60 MG/2ML IM SOLN
30.0000 mg | Freq: Once | INTRAMUSCULAR | Status: AC
  Administered 2018-11-27: 30 mg via INTRAMUSCULAR

## 2018-11-27 MED ORDER — BACLOFEN 10 MG PO TABS: 10.0000 mg | ORAL_TABLET | Freq: Three times a day (TID) | ORAL | 0 refills | Status: DC

## 2018-11-27 NOTE — Patient Instructions (Signed)
Take Tylenol 500 mg up to 1000 mg at once for pain, between Aleve doses if needed more pain relief.    Muscle Cramps and Spasms Muscle cramps and spasms are when muscles tighten by themselves. They usually get better within minutes. Muscle cramps are painful. They are usually stronger and last longer than muscle spasms. Muscle spasms may or may not be painful. They can last a few seconds or much longer. Cramps and spasms can affect any muscle, but they occur most often in the calf muscles of the leg. They are usually not caused by a serious problem. In many cases, the cause is not known. Some common causes include:  Doing more physical work or exercise than your body is ready for.  Using the muscles too much (overuse) by repeating certain movements too many times.  Staying in a certain position for a long time.  Playing a sport or doing an activity without preparing properly.  Using bad form or technique while playing a sport or doing an activity.  Not having enough water in your body (dehydration).  Injury.  Side effects of some medicines.  Low levels of the salts and minerals in your blood (electrolytes), such as low potassium or calcium. Follow these instructions at home: Managing pain and stiffness      Massage, stretch, and relax the muscle. Do this for many minutes at a time.  If told, put heat on tight or tense muscles as often as told by your doctor. Use the heat source that your doctor recommends, such as a moist heat pack or a heating pad. ? Place a towel between your skin and the heat source. ? Leave the heat on for 20-30 minutes. ? Remove the heat if your skin turns bright red. This is very important if you are not able to feel pain, heat, or cold. You may have a greater risk of getting burned.  If told, put ice on the affected area. This may help if you are sore or have pain after a cramp or spasm. ? Put ice in a plastic bag. ? Place a towel between your skin and  the bag. ? Leave the ice on for 20 minutes, 2-3 times a day.  Try taking hot showers or baths to help relax tight muscles. Eating and drinking  Drink enough fluid to keep your pee (urine) pale yellow.  Eat a healthy diet to help ensure that your muscles work well. This should include: ? Fruits and vegetables. ? Lean protein. ? Whole grains. ? Low-fat or nonfat dairy products. General instructions  If you are having cramps often, avoid intense exercise for several days.  Take over-the-counter and prescription medicines only as told by your doctor.  Watch for any changes in your symptoms.  Keep all follow-up visits as told by your doctor. This is important. Contact a doctor if:  Your cramps or spasms get worse or happen more often.  Your cramps or spasms do not get better with time. Summary  Muscle cramps and spasms are when muscles tighten by themselves. They usually get better within minutes.  Cramps and spasms occur most often in the calf muscles of the leg.  Massage, stretch, and relax the muscle. This may help the cramp or spasm go away.  Drink enough fluid to keep your pee (urine) pale yellow. This information is not intended to replace advice given to you by your health care provider. Make sure you discuss any questions you have with your health care  provider. Document Released: 09/08/2008 Document Revised: 02/19/2018 Document Reviewed: 02/19/2018 Elsevier Interactive Patient Education  2019 [employer].

## 2018-11-27 NOTE — Progress Notes (Signed)
Subjective:     Patient ID: Kara Kelley , female    DOB: 07-30-1956 , 63 y.o.   MRN: [id number]   Chief Complaint  Patient presents with  . Neck Pain    2 weeks-already see therapist for it - effecting sleep  . Shoulder Pain    rt is worse than left-constant aching pain    HPI Woke up in the middle of the night with increase pressure in her R occipital head and sometimes radiates to R temple. Aleeve eases the pain a little, but does not resolve it. The pressure is not present right now, but the pain is still present. This pressure keeps her awake. Today has nausea, has also noticed blurred vision off and on.   She also has bilateral shoulder pain x 2 weeks R>L and she denies an injury. Feels the pain is coming from her R neck to mid upper arm.  Pain is described as achy, worse to more R arm, nothing helps pain when changing position of her arm, and gets worser on her back.     Past Medical History:  Diagnosis Date  . Anemia   . Anxiety   . Arthritis   . Asthma   . Bunion   . Callus   . Chronic pain   . Cocaine abuse (HCC)   . COPD (chronic obstructive pulmonary disease) (HCC)   . Corns and callosities   . Degenerative joint disease   . Depression   . Diabetes mellitus   . Endometrial polyp   . ETOH abuse   . Gall stones   . GERD (gastroesophageal reflux disease)   . Headache    history of Migraines  . Hepatitis C    Hep C  . Hyperlipidemia   . Hypertension   . Insomnia   . Spondylolisthesis of lumbar region   . Substance abuse (HCC)    alcoholism  . Tuberculosis 1985  . Wears dentures   . Wears glasses      Family History  Problem Relation Age of Onset  . Heart disease Mother   . Diabetes Other        mat great aunt  . Cirrhosis Other        mat great aunt  . Cirrhosis Other        mat great uncles x 2  . Colon cancer Neg Hx   . Breast cancer Neg Hx      Current Outpatient Medications:  .  albuterol (PROVENTIL HFA;VENTOLIN HFA) 108 (90 Base)  MCG/ACT inhaler, Inhale 2 puffs into the lungs every 6 (six) hours as needed. For shortness of breath., Disp: 18 g, Rfl: 1 .  atorvastatin (LIPITOR) 10 MG tablet, Take 1 tablet (10 mg total) by mouth daily., Disp: 90 tablet, Rfl: 0 .  Blood Glucose Monitoring Suppl (ACCU-CHEK NANO SMARTVIEW) w/Device KIT, 1 each by Does not apply route 3 (three) times daily., Disp: 1 kit, Rfl: 0 .  buPROPion (WELLBUTRIN XL) 300 MG 24 hr tablet, Take 300 mg by mouth every morning., Disp: , Rfl: 0 .  glucose blood test strip, Use as instructed, Disp: 300 each, Rfl: 3 .  Insulin Detemir (LEVEMIR FLEXTOUCH) 100 UNIT/ML Pen, Inject 40 Units into the skin at bedtime., Disp: 15 pen, Rfl: 1 .  Insulin Pen Needle (PEN NEEDLES) 32G X 4 MM MISC, 1 each by Does not apply route 3 (three) times daily., Disp: 200 each, Rfl: 3 .  lisinopril (PRINIVIL,ZESTRIL) 2.5 MG tablet, TAKE 1  TABLET BY MOUTH EVERY DAY, Disp: 90 tablet, Rfl: 1 .  meloxicam (MOBIC) 15 MG tablet, TK 1 T PO QD, Disp: , Rfl: 0 .  metFORMIN (GLUCOPHAGE) 500 MG tablet, Take 1 tablet (500 mg total) by mouth 2 (two) times daily with a meal., Disp: 60 tablet, Rfl: 2 .  NOVOLOG FLEXPEN 100 UNIT/ML FlexPen, ADMINISTER 18 UNITS UNDER THE SKIN THREE TIMES DAILY AS NEEDED FOR HIGH BLOOD SUGAR, Disp: 15 mL, Rfl: 0 .  omeprazole (PRILOSEC) 40 MG capsule, Take 1 capsule (40 mg total) by mouth daily before breakfast., Disp: 90 capsule, Rfl: 0 .  oxycodone (OXY-IR) 5 MG capsule, Take 5 mg by mouth every 4 (four) hours as needed., Disp: , Rfl:  .  spironolactone (ALDACTONE) 50 MG tablet, Take 1 tablet (50 mg total) by mouth daily., Disp: 90 tablet, Rfl: 1 .  traZODone (DESYREL) 100 MG tablet, Take 100 mg by mouth at bedtime., Disp: , Rfl:    No Known Allergies   Review of Systems  Constitutional: Negative for chills, diaphoresis, fatigue and fever.  Respiratory: Negative for cough, chest tightness and shortness of breath.   Cardiovascular: Negative for chest pain.   Musculoskeletal: Positive for neck pain and neck stiffness. Negative for gait problem.  Skin: Negative for rash and wound.  Neurological: Positive for headaches. Negative for dizziness, syncope, facial asymmetry, speech difficulty, weakness and numbness.  Hematological: Negative for adenopathy.     Today's Vitals   11/27/18 1422  BP: (!) 160/90  Pulse: 88  Temp: 98.2 F (36.8 C)  TempSrc: Oral  SpO2: 97%  Weight: 216 lb 6.4 oz (98.2 kg)  Height: 4' 11.8" (1.519 m)   Body mass index is 42.55 kg/m.   Objective:  Physical Exam Vitals signs and nursing note reviewed.  Constitutional:      Appearance: She is obese. She is not ill-appearing, toxic-appearing or diaphoretic.     Comments: Appears in pain when she moves her head  HENT:     Head: Normocephalic and atraumatic.     Right Ear: External ear normal.     Left Ear: External ear normal.     Nose: Nose normal.  Eyes:     General: No scleral icterus.       Right eye: No discharge.        Left eye: No discharge.     Extraocular Movements: Extraocular movements intact.     Conjunctiva/sclera: Conjunctivae normal.     Pupils: Pupils are equal, round, and reactive to light.  Neck:     Musculoskeletal: Muscular tenderness present. No neck rigidity.     Vascular: No carotid bruit.     Comments: Has decreased rotation to her R due to stiffness and pain. Has pain during L lateral flexion and posterior flexion. Has decreased anterior flexion and felt pain on her R trap and occipital region.  Cardiovascular:     Rate and Rhythm: Normal rate and regular rhythm.     Heart sounds: No murmur.  Pulmonary:     Effort: Pulmonary effort is normal.     Breath sounds: Normal breath sounds.  Musculoskeletal:        General: Tenderness present. No swelling, deformity or signs of injury.     Comments: Has normal ROM of L arm and R arm, but extension provoked R upper shoulder pain. Has diffuse tenderness on bicep, deltoid and tricep. Her R  trap is very tight and tender, not as tender on the L.   Lymphadenopathy:  Cervical: No cervical adenopathy.  Skin:    General: Skin is warm and dry.     Coloration: Skin is not jaundiced or pale.     Findings: No bruising, erythema, lesion or rash.  Neurological:     Mental Status: She is alert and oriented to person, place, and time.     Sensory: No sensory deficit.     Motor: No weakness.     Deep Tendon Reflexes: Reflexes abnormal.     Comments: R upper arm reflex 2/4, L upper arm reflex +3/4  Psychiatric:        Mood and Affect: Mood normal.        Behavior: Behavior normal.        Thought Content: Thought content normal.        Judgment: Judgment normal.    NEURO- Unable to do tandem gait.     Assessment And Plan:    1. Acute intractable headache, unspecified headache type- I believe this is from neck spasms and may be causing occipital nerve inflammation.  - ketorolac (TORADOL) injection 30 mg - DG Cervical Spine Complete; Future  2. Muscle spasms of neck- acute. I will have her try alternation of heat on tight muscles and baclofen tablet. I showed her to do neck stretches after heat.   3. Cervicalgia- acute - DG Cervical Spine Complete; Future  4. Acute pain of right shoulder- acute - DG Shoulder Right; Future  FU 2 days for DM, possible trigger point injection if muscle spasms are not better.   Kara Isola RODRIGUEZ-SOUTHWORTH, PA-C

## 2018-11-28 ENCOUNTER — Ambulatory Visit
Admission: RE | Admit: 2018-11-28 | Discharge: 2018-11-28 | Disposition: A | Discharge status: Home / Self Care | Payer: Medicaid Other | Source: Ambulatory Visit | Attending: Internal Medicine | Admitting: Internal Medicine

## 2018-11-28 ENCOUNTER — Other Ambulatory Visit: Payer: Self-pay | Admitting: Internal Medicine

## 2018-11-28 DIAGNOSIS — R519 Headache, unspecified: Secondary | ICD-10-CM

## 2018-11-28 DIAGNOSIS — R51 Headache: Principal | ICD-10-CM

## 2018-11-28 DIAGNOSIS — M542 Cervicalgia: Secondary | ICD-10-CM

## 2018-11-28 DIAGNOSIS — M25511 Pain in right shoulder: Secondary | ICD-10-CM

## 2018-11-29 ENCOUNTER — Other Ambulatory Visit: Payer: Self-pay

## 2018-11-29 ENCOUNTER — Ambulatory Visit: Payer: Medicaid Other | Admitting: Internal Medicine

## 2018-11-29 ENCOUNTER — Encounter: Payer: Self-pay | Admitting: Internal Medicine

## 2018-11-29 VITALS — BP 150/78 | HR 74 | Temp 97.8°F | Ht 60.2 in | Wt 216.8 lb

## 2018-11-29 DIAGNOSIS — E1165 Type 2 diabetes mellitus with hyperglycemia: Secondary | ICD-10-CM | POA: Diagnosis not present

## 2018-11-29 DIAGNOSIS — M542 Cervicalgia: Secondary | ICD-10-CM

## 2018-11-29 DIAGNOSIS — I10 Essential (primary) hypertension: Secondary | ICD-10-CM | POA: Diagnosis not present

## 2018-11-29 DIAGNOSIS — M62838 Other muscle spasm: Secondary | ICD-10-CM | POA: Diagnosis not present

## 2018-11-29 DIAGNOSIS — M5412 Radiculopathy, cervical region: Secondary | ICD-10-CM

## 2018-11-29 MED ORDER — LISINOPRIL 10 MG PO TABS: 10.0000 mg | ORAL_TABLET | Freq: Every day | ORAL | 0 refills | Status: DC

## 2018-11-29 NOTE — Patient Instructions (Addendum)
Eat Wild caught salmon  And tilapia since this are healthier.  Eat only 1/2 cup for carbohydrates and eat vegetables with some kind of meat. Try no to eat much pork.  Taper off sweets slowly with the goal to be off sugars in the next month When you choose sweeteners use Stevia instead of splenda or nutrasweet or equal.   Today use ice for 15 mintutes on areas I injected you every 2 hours, then starting tomorrow, use heat for the same length of time and do stretches after the heat. But you only do the head 2-4 times a days for 5-7 days.   Trigger Point Injections Patient Information  Description: Trigger points are areas of muscle sensitive to touch which cause pain with movement, sometimes felt some distance from the site of palpation.  Usually the muscle containing these trigger points if felt as a tight band or knot.   The area of maximum tenderness or trigger point is identified, and after antiseptic preparation of the skin, a small needle is placed into this site.  Reproduction of the pain often occurs and numbing medicine (local anesthetic) is injected into the site, sometimes along with steroid preparation.  The entire block usually lasts less than 5 minutes.  Conditions which may be treated by trigger points:   Muscular pain and spasm  Nerve irritation  Preparation for the injection:  1. Do not eat any solid food or dairy products within 8 hours of your appointment. 2. You may drink clear liquids up to 3 hours before appointment.  Clear liquids include water, black coffee, juice or soda.  No milk or cream please. 3. You may take your regular medications, including pain medications, with a sip of water before your appointment.  Diabetics should hold regular insulin ( if take separately) and take 1/2 normal NPH dose the morning of the procedure.  Carry some sugar containing items with you to your appointment. 4. A driver must accompany you and be prepared to drive you home after your  procedure.  5. Bring all your current medications with you. 6. An IV may be inserted and sedation may be given at the discretion of the physician.  7. A blood pressure cuff, EKG, and other monitors will often be applied during the procedure.  Some patients may need to have extra oxygen administered for a short period. 8. You will be asked to provide medical information, including your allergies and medications, prior to the procedure.  We must know immediately if you are taking blood thinners (like Coumadin/Warfarin) or if you are allergic to IV iodine contrast (dye).  We must know if you could possibly be pregnant.  Possible side-effects:   Bleeding from needle site  Infection (rare, may require surgery)  Nerve injury (rare)  Numbness & tingling (temporary)  Punctured lung (if injection around chest)  Light-headedness (temporary)  Pain at injection site (several days)  Decreased blood pressure (rare, temporary)  Weakness in arm/leg (temporary)  Call if you experience:   Hive or difficulty breathing (go to the emergency room)  Inflammation or drainage at the injection site(s)  Please note:  Although the local anesthetic injected can often make your painful muscle feel good for several hours after the injection, the pain may return.  It takes 3-7 days for steroids to work.  You may not notice any pain relief for at least one week.  If effective, we will often do a series of injections spaced 3-6 weeks apart to maximally decrease  your pain.  If you have any questions please call [phone] [facility]

## 2018-11-29 NOTE — Progress Notes (Signed)
Subjective:     Patient ID: Kara Kelley , female    DOB: 1955-11-19 , 63 y.o.   MRN: [id number]   Chief Complaint  Patient presents with  . Hypertension    elevated bp on last visit    HPI  1- Here for FU HTN, she took her medication today and has been taking it every day. Her daughter takes her BP at home and it runs 130's/ 90's  2- FU DM, her glucose has been somewhere between 52- 220, has had a couple reading in the 80;s. She stopped  Eating sweets since we spoke last time, but admits it has been hard. She is ready to get on Ozempic as her PCP mentioned to her if glucose stayed elevated.   3- Here for FU R neck pain and xrays of R shoulder and neck. She will be going out of [place] for 3 [place]. She admits the Baclofen has helped and has been using heat, but forgot to do stretches. Still has a bad HA on R occipital region. Admits her neck is 50% better, but is interested in getting trigger point injections we discussed last visit. Her R neck pain still radiates pain to her R shoulder and mid upper arm. Denies weakness or paresthesia of her arms or hands.   Past Medical History:  Diagnosis Date  . Anemia   . Anxiety   . Arthritis   . Asthma   . Bunion   . Callus   . Chronic pain   . Cocaine abuse (HCC)   . COPD (chronic obstructive pulmonary disease) (HCC)   . Corns and callosities   . Degenerative joint disease   . Depression   . Diabetes mellitus   . Endometrial polyp   . ETOH abuse   . Gall stones   . GERD (gastroesophageal reflux disease)   . Headache    history of Migraines  . Hepatitis C    Hep C  . Hyperlipidemia   . Hypertension   . Insomnia   . Spondylolisthesis of lumbar region   . Substance abuse (HCC)    alcoholism  . Tuberculosis 1985  . Wears dentures   . Wears glasses      Family History  Problem Relation Age of Onset  . Heart disease Mother   . Diabetes Other        mat great aunt  . Cirrhosis Other        mat great aunt  . Cirrhosis  Other        mat great uncles x 2  . Colon cancer Neg Hx   . Breast cancer Neg Hx      Current Outpatient Medications:  .  albuterol (PROVENTIL HFA;VENTOLIN HFA) 108 (90 Base) MCG/ACT inhaler, Inhale 2 puffs into the lungs every 6 (six) hours as needed. For shortness of breath., Disp: 18 g, Rfl: 1 .  atorvastatin (LIPITOR) 10 MG tablet, Take 1 tablet (10 mg total) by mouth daily., Disp: 90 tablet, Rfl: 0 .  baclofen (LIORESAL) 10 MG tablet, Take 1 tablet (10 mg total) by mouth 3 (three) times daily., Disp: 30 tablet, Rfl: 0 .  Blood Glucose Monitoring Suppl (ACCU-CHEK NANO SMARTVIEW) w/Device KIT, 1 each by Does not apply route 3 (three) times daily., Disp: 1 kit, Rfl: 0 .  buPROPion (WELLBUTRIN XL) 300 MG 24 hr tablet, Take 300 mg by mouth every morning., Disp: , Rfl: 0 .  glucose blood test strip, Use as instructed, Disp: 300 each,  Rfl: 3 .  Insulin Detemir (LEVEMIR FLEXTOUCH) 100 UNIT/ML Pen, Inject 40 Units into the skin at bedtime., Disp: 15 pen, Rfl: 1 .  Insulin Pen Needle (PEN NEEDLES) 32G X 4 MM MISC, 1 each by Does not apply route 3 (three) times daily., Disp: 200 each, Rfl: 3 .  lisinopril (PRINIVIL,ZESTRIL) 2.5 MG tablet, TAKE 1 TABLET BY MOUTH EVERY DAY, Disp: 90 tablet, Rfl: 1 .  meloxicam (MOBIC) 15 MG tablet, TK 1 T PO QD, Disp: , Rfl: 0 .  metFORMIN (GLUCOPHAGE) 500 MG tablet, Take 1 tablet (500 mg total) by mouth 2 (two) times daily with a meal., Disp: 60 tablet, Rfl: 2 .  NOVOLOG FLEXPEN 100 UNIT/ML FlexPen, ADMINISTER 18 UNITS UNDER THE SKIN THREE TIMES DAILY AS NEEDED FOR HIGH BLOOD SUGAR, Disp: 15 mL, Rfl: 0 .  omeprazole (PRILOSEC) 40 MG capsule, Take 1 capsule (40 mg total) by mouth daily before breakfast., Disp: 90 capsule, Rfl: 0 .  oxycodone (OXY-IR) 5 MG capsule, Take 5 mg by mouth every 4 (four) hours as needed., Disp: , Rfl:  .  spironolactone (ALDACTONE) 50 MG tablet, Take 1 tablet (50 mg total) by mouth daily., Disp: 90 tablet, Rfl: 1 .  traZODone (DESYREL) 100  MG tablet, Take 100 mg by mouth at bedtime., Disp: , Rfl:    No Known Allergies   Review of Systems  Constitutional: Negative for appetite change, chills and fever.  Respiratory: Negative for cough, chest tightness and shortness of breath.   Cardiovascular: Negative for chest pain, palpitations and leg swelling.  Endocrine: Negative for polydipsia and polyphagia.  Genitourinary: Negative for difficulty urinating.  Musculoskeletal: Positive for arthralgias, neck pain and neck stiffness. Negative for gait problem and joint swelling.  Skin: Negative for rash.  Neurological: Positive for headaches. Negative for dizziness, tremors, seizures, syncope, speech difficulty, weakness, light-headedness and numbness.  Hematological: Negative for adenopathy.  Psychiatric/Behavioral: Negative for behavioral problems and confusion.     Today's Vitals   11/29/18 1204  BP: (!) 150/78  Pulse: 74  Temp: 97.8 F (36.6 C)  TempSrc: Oral  Weight: 216 lb 12.8 oz (98.3 kg)  Height: 5' 0.2" (1.529 m)   Body mass index is 42.06 kg/m.   Objective:  Physical Exam Vitals signs and nursing note reviewed.  Constitutional:      General: She is not in acute distress.    Appearance: Normal appearance. She is not toxic-appearing.     Comments: Does not seem as in much pain as 2 days ago.   HENT:     Head: Normocephalic and atraumatic.     Right Ear: External ear normal.     Left Ear: External ear normal.     Nose: Nose normal.  Eyes:     General: No scleral icterus.       Right eye: No discharge.        Left eye: No discharge.     Extraocular Movements: Extraocular movements intact.     Conjunctiva/sclera: Conjunctivae normal.     Pupils: Pupils are equal, round, and reactive to light.  Neck:     Musculoskeletal: Muscular tenderness present.     Vascular: No carotid bruit.     Comments: R trapezius is still tender, but not as tight as 2 days ago. I pointed 7 trigger point areas of pain where I did  trigger point injections.  Cardiovascular:     Rate and Rhythm: Normal rate and regular rhythm.     Pulses: Normal pulses.  Heart sounds: No murmur.  Pulmonary:     Effort: Pulmonary effort is normal.     Breath sounds: Normal breath sounds.  Musculoskeletal: Normal range of motion.        General: Tenderness present. No deformity.     Comments: R deltoid, bicep, and posterior shoulder is tender to palpation, ROM causes pain, but has normal ROM  Lymphadenopathy:     Cervical: No cervical adenopathy.  Skin:    General: Skin is warm and dry.     Findings: No rash.  Neurological:     Mental Status: She is alert and oriented to person, place, and time.     Sensory: No sensory deficit.     Motor: No weakness.     Coordination: Coordination normal.     Gait: Gait normal.     Deep Tendon Reflexes: Reflexes normal.  Psychiatric:        Mood and Affect: Mood normal.        Behavior: Behavior normal.        Thought Content: Thought content normal.        Judgment: Judgment normal.   PROCEDURE:  Verbal informed consent obtained from pt. 7 trigger point tender areas were marked. Each of them cleansed with alcohol before injecting 0. 5 ml of  1% lidocaine. Minimal bleeding occurred. Pt tolerated this well.    Recent Vital Signs   BP (!) 150/78 (BP Location: Left Arm, Patient Position: Sitting, Cuff Size: Normal)   Pulse 74   Temp 97.8 F (36.6 C) (Oral)   Ht 5' 0.2" (1.529 m)   Wt 216 lb 12.8 oz (98.3 kg)   BMI 42.06 kg/m    Past Medical History:  Diagnosis Date  . Anemia   . Anxiety   . Arthritis   . Asthma   . Bunion   . Callus   . Chronic pain   . Cocaine abuse (HCC)   . COPD (chronic obstructive pulmonary disease) (HCC)   . Corns and callosities   . Degenerative joint disease   . Depression   . Diabetes mellitus   . Endometrial polyp   . ETOH abuse   . Gall stones   . GERD (gastroesophageal reflux disease)   . Headache    history of Migraines  . Hepatitis C     Hep C  . Hyperlipidemia   . Hypertension   . Insomnia   . Spondylolisthesis of lumbar region   . Substance abuse (HCC)    alcoholism  . Tuberculosis 1985  . Wears dentures   . Wears glasses     Assessment And Plan:  .1. Cervicalgia- improved but not resolved.  - MR Cervical Spine Wo Contrast; Future  2. Muscle spasms of neck- slightly improved. Trigger point injections done today. Advised to apply ice on this area q 2h while awake today, then tomorrow may go back to heat and I reviewed the stretches she needs to do. I also placed an order for PT.   3. Essential hypertension- not well controlled, but her pain may be playing a part in this. I increased her medication to lisinopril 10 mg. Qd. FU 3/6 and needs to bring her BP cuff with her.   4. Uncontrolled type 2 diabetes mellitus with hyperglycemia (HCC) -chronic.    She was given Ozempinc education and received her fist dose today. FU 3/6 before she leaves town.   5. Cervical radiculopathy- acute. I reviewed her cervical spine xray which is showing foraminal stenosis  at C6-7 - MR Cervical Spine Wo Contrast; Future     Shaw Dobek RODRIGUEZ-SOUTHWORTH, PA-C

## 2018-11-30 ENCOUNTER — Ambulatory Visit: Payer: Medicaid Other | Admitting: Physical Therapy

## 2018-12-06 ENCOUNTER — Telehealth: Payer: Self-pay | Admitting: Internal Medicine

## 2018-12-06 NOTE — Telephone Encounter (Signed)
See note

## 2018-12-07 ENCOUNTER — Telehealth: Payer: Self-pay | Admitting: Physical Therapy

## 2018-12-07 ENCOUNTER — Ambulatory Visit: Payer: Medicaid Other | Admitting: Physical Therapy

## 2018-12-07 NOTE — Telephone Encounter (Signed)
Spoke with pt about missing today's appointment. She states she forgot, but is doing better today. Discussed she still had appointments scheduled which she stated she can't make because she is heading to [place] for a couple of months. I told her that we can go ahead and discharge her and when she returns if she is still dealing with the same issue she will need to get a new referral.

## 2018-12-08 ENCOUNTER — Other Ambulatory Visit: Payer: Medicaid Other

## 2018-12-12 ENCOUNTER — Telehealth: Payer: Self-pay | Admitting: Internal Medicine

## 2018-12-12 NOTE — Telephone Encounter (Signed)
I spoke with Arna Medici to have this re-scheduled since I could not speak with Dr Victorino Dike. I explained I have been sitting by my phone waiting on the call. Will be done tomorrow at 8:15 am with Dr Vear Clock. I gave them my cell phone # this time.

## 2018-12-13 ENCOUNTER — Encounter: Payer: Self-pay | Admitting: Internal Medicine

## 2018-12-13 ENCOUNTER — Other Ambulatory Visit: Payer: Self-pay

## 2018-12-13 ENCOUNTER — Telehealth: Payer: Self-pay | Admitting: Internal Medicine

## 2018-12-13 ENCOUNTER — Ambulatory Visit: Payer: Medicaid Other | Admitting: Internal Medicine

## 2018-12-13 ENCOUNTER — Other Ambulatory Visit: Payer: Self-pay | Admitting: Internal Medicine

## 2018-12-13 VITALS — BP 130/80 | HR 79 | Temp 98.2°F | Ht 59.8 in | Wt 207.4 lb

## 2018-12-13 DIAGNOSIS — M542 Cervicalgia: Secondary | ICD-10-CM

## 2018-12-13 DIAGNOSIS — B070 Plantar wart: Secondary | ICD-10-CM | POA: Diagnosis not present

## 2018-12-13 DIAGNOSIS — Z794 Long term (current) use of insulin: Secondary | ICD-10-CM

## 2018-12-13 DIAGNOSIS — I10 Essential (primary) hypertension: Secondary | ICD-10-CM | POA: Diagnosis not present

## 2018-12-13 DIAGNOSIS — E11649 Type 2 diabetes mellitus with hypoglycemia without coma: Secondary | ICD-10-CM

## 2018-12-13 MED ORDER — SEMAGLUTIDE(0.25 OR 0.5MG/DOS) 2 MG/1.5ML ~~LOC~~ SOPN: 0.5000 mg | PEN_INJECTOR | SUBCUTANEOUS | 1 refills | Status: DC

## 2018-12-13 MED ORDER — MELOXICAM 15 MG PO TABS: 15.0000 mg | ORAL_TABLET | Freq: Every day | ORAL | 1 refills | Status: DC

## 2018-12-13 MED ORDER — LISINOPRIL 10 MG PO TABS: 10.0000 mg | ORAL_TABLET | Freq: Every day | ORAL | 1 refills | Status: DC

## 2018-12-13 MED ORDER — ATORVASTATIN CALCIUM 10 MG PO TABS: 10.0000 mg | ORAL_TABLET | Freq: Every day | ORAL | 0 refills | Status: DC

## 2018-12-13 MED ORDER — SPIRONOLACTONE 50 MG PO TABS: 50.0000 mg | ORAL_TABLET | Freq: Every day | ORAL | 1 refills | Status: DC

## 2018-12-13 MED ORDER — BACLOFEN 10 MG PO TABS: 10.0000 mg | ORAL_TABLET | Freq: Three times a day (TID) | ORAL | 0 refills | Status: DC

## 2018-12-13 MED ORDER — METFORMIN HCL 500 MG PO TABS: 500.0000 mg | ORAL_TABLET | Freq: Two times a day (BID) | ORAL | 1 refills | Status: DC

## 2018-12-13 MED ORDER — ONDANSETRON HCL 4 MG PO TABS: 4.0000 mg | ORAL_TABLET | Freq: Every day | ORAL | 0 refills | Status: DC | PRN

## 2018-12-13 NOTE — Telephone Encounter (Signed)
Phone call to pt made.

## 2018-12-13 NOTE — Progress Notes (Signed)
Subjective:     Patient ID: Kara Kelley , female    DOB: 1956/04/18 , 63 y.o.   MRN: [id number]   Chief Complaint  Patient presents with  . Diabetes    HPI  Pt is here for FU R neck pain, HA and trial of Ozempic. She is at week 2 of the Ozempic and her glucose have been less than 200. The highest 172, and as low as 82. She does feel nauseous though she is not eating much each time she sits to eat. She has cut out eating sweets. She saw GI yesterday and was given samples of Linzes, but does not know the dose.  Her R neck pain feels better, not having HA's any more. R arm is not bothering her today. The Baclofen has helped a lot.   Past Medical History:  Diagnosis Date  . Anemia   . Anxiety   . Arthritis   . Asthma   . Bunion   . Callus   . Chronic pain   . Cocaine abuse (HCC)   . COPD (chronic obstructive pulmonary disease) (HCC)   . Corns and callosities   . Degenerative joint disease   . Depression   . Diabetes mellitus   . Endometrial polyp   . ETOH abuse   . Gall stones   . GERD (gastroesophageal reflux disease)   . Headache    history of Migraines  . Hepatitis C    Hep C  . Hyperlipidemia   . Hypertension   . Insomnia   . Spondylolisthesis of lumbar region   . Substance abuse (HCC)    alcoholism  . Tuberculosis 1985  . Wears dentures   . Wears glasses      Family History  Problem Relation Age of Onset  . Heart disease Mother   . Diabetes Other        mat great aunt  . Cirrhosis Other        mat great aunt  . Cirrhosis Other        mat great uncles x 2  . Colon cancer Neg Hx   . Breast cancer Neg Hx      Current Outpatient Medications:  .  buprenorphine-naloxone (SUBOXONE) 2-0.5 mg SUBL SL tablet, Place 1 tablet under the tongue daily., Disp: , Rfl:  .  albuterol (PROVENTIL HFA;VENTOLIN HFA) 108 (90 Base) MCG/ACT inhaler, Inhale 2 puffs into the lungs every 6 (six) hours as needed. For shortness of breath., Disp: 18 g, Rfl: 1 .  atorvastatin  (LIPITOR) 10 MG tablet, Take 1 tablet (10 mg total) by mouth daily., Disp: 90 tablet, Rfl: 0 .  baclofen (LIORESAL) 10 MG tablet, Take 1 tablet (10 mg total) by mouth 3 (three) times daily., Disp: 30 tablet, Rfl: 0 .  Blood Glucose Monitoring Suppl (ACCU-CHEK NANO SMARTVIEW) w/Device KIT, 1 each by Does not apply route 3 (three) times daily., Disp: 1 kit, Rfl: 0 .  buPROPion (WELLBUTRIN XL) 300 MG 24 hr tablet, Take 300 mg by mouth every morning., Disp: , Rfl: 0 .  glucose blood test strip, Use as instructed, Disp: 300 each, Rfl: 3 .  Insulin Detemir (LEVEMIR FLEXTOUCH) 100 UNIT/ML Pen, Inject 40 Units into the skin at bedtime., Disp: 15 pen, Rfl: 1 .  Insulin Pen Needle (PEN NEEDLES) 32G X 4 MM MISC, 1 each by Does not apply route 3 (three) times daily., Disp: 200 each, Rfl: 3 .  lisinopril (PRINIVIL,ZESTRIL) 10 MG tablet, Take 1 tablet (10  mg total) by mouth daily., Disp: 30 tablet, Rfl: 0 .  meloxicam (MOBIC) 15 MG tablet, TK 1 T PO QD, Disp: , Rfl: 0 .  metFORMIN (GLUCOPHAGE) 500 MG tablet, Take 1 tablet (500 mg total) by mouth 2 (two) times daily with a meal., Disp: 60 tablet, Rfl: 2 .  NOVOLOG FLEXPEN 100 UNIT/ML FlexPen, ADMINISTER 18 UNITS UNDER THE SKIN THREE TIMES DAILY AS NEEDED FOR HIGH BLOOD SUGAR, Disp: 15 mL, Rfl: 0 .  omeprazole (PRILOSEC) 40 MG capsule, Take 1 capsule (40 mg total) by mouth daily before breakfast., Disp: 90 capsule, Rfl: 0 .  oxycodone (OXY-IR) 5 MG capsule, Take 5 mg by mouth every 4 (four) hours as needed., Disp: , Rfl:  .  spironolactone (ALDACTONE) 50 MG tablet, Take 1 tablet (50 mg total) by mouth daily., Disp: 90 tablet, Rfl: 1 .  traZODone (DESYREL) 100 MG tablet, Take 100 mg by mouth at bedtime., Disp: , Rfl:    No Known Allergies   Review of Systems  Constitutional: Negative for chills, diaphoresis and fatigue.  HENT: Negative for congestion.   Respiratory: Negative for cough, chest tightness and shortness of breath.   Cardiovascular: Negative for  chest pain, palpitations and leg swelling.  Gastrointestinal: Positive for constipation and nausea. Negative for abdominal distention, abdominal pain, diarrhea and vomiting.  Endocrine: Negative for polydipsia and polyphagia.  Genitourinary: Negative for difficulty urinating.  Musculoskeletal: Positive for neck pain and neck stiffness.  Skin: Negative for rash and wound.  Neurological: Negative for numbness.     Today's Vitals   12/13/18 0841  BP: 130/80  Pulse: 79  Temp: 98.2 F (36.8 C)  TempSrc: Oral  Weight: 207 lb 6.4 oz (94.1 kg)  Height: 4' 11.8" (1.519 m)   Body mass index is 40.78 kg/m.   Objective:  Physical Exam   Constitutional: She is oriented to person, place, and time. She appears well-developed and well-nourished. No distress.  HENT:  Head: Normocephalic and atraumatic.  Right Ear: External ear normal.  Left Ear: External ear normal.  Nose: Nose normal.  Eyes: Conjunctivae are normal. Right eye exhibits no discharge. Left eye exhibits no discharge. No scleral icterus.  Neck: Neck supple. No thyromegaly present. Has mild tense and tenderness of R trap and rhomboid.  Cardiovascular: Normal rate and regular rhythm.  No murmur heard. Pulmonary/Chest: Effort normal and breath sounds normal. No respiratory distress.  Musculoskeletal: Normal range of motion. She exhibits no edema. Has a plantar warn on R central ball of foot and L lateral distal sole of foot. The L one is tender.  Lymphadenopathy:    She has no cervical adenopathy.  Neurological: She is alert and oriented to person, place, and time. UE reflexes +2/4 bilaterally. Has symmetric strength of both arms.  Skin: Skin is warm and dry. Capillary refill takes less than 2 seconds. No rash noted. She is not diaphoretic.  Psychiatric: She has a normal mood and affect. Her behavior is normal. Judgment and thought content normal.  Nursing note reviewed. Assessment And Plan:  1. Essential hypertension-  controlled.  - spironolactone (ALDACTONE) 50 MG tablet; Take 1 tablet (50 mg total) by mouth daily.  Dispense: 30 tablet; Refill: 1 - CMP14 + Anion Gap - CBC no Diff - Lipid Profile  2. Type 2 diabetes mellitus with hypoglycemia without coma, with long-term current use of insulin (HCC)- improving.  - metFORMIN (GLUCOPHAGE) 500 MG tablet; Take 1 tablet (500 mg total) by mouth 2 (two) times daily with a  meal.  Dispense: 60 tablet; Refill: 1 - Hemoglobin A1c - Lipid Profile  3. Cervicalgia- improving. Has no neurological deficit.   4. Plantar wart of both feet- acute. Will call her podiatrist when she is back in town to have this taken care of.    Breeona Waid RODRIGUEZ-SOUTHWORTH, PA-C

## 2018-12-13 NOTE — Patient Instructions (Addendum)
Carbohydrate Counting for Diabetes Mellitus, Adult  Carbohydrate counting is a method of keeping track of how many carbohydrates you eat. Eating carbohydrates naturally increases the amount of sugar (glucose) in the blood. Counting how many carbohydrates you eat helps keep your blood glucose within normal limits, which helps you manage your diabetes (diabetes mellitus). It is important to know how many carbohydrates you can safely have in each meal. This is different for every person. A diet and nutrition specialist (registered dietitian) can help you make a meal plan and calculate how many carbohydrates you should have at each meal and snack. Carbohydrates are found in the following foods:  Grains, such as breads and cereals.  Dried beans and soy products.  Starchy vegetables, such as potatoes, peas, and corn.  Fruit and fruit juices.  Milk and yogurt.  Sweets and snack foods, such as cake, cookies, candy, chips, and soft drinks. How do I count carbohydrates? There are two ways to count carbohydrates in food. You can use either of the methods or a combination of both. Reading "Nutrition Facts" on packaged food The "Nutrition Facts" list is included on the labels of almost all packaged foods and beverages in the [place] It includes:  The serving size.  Information about nutrients in each serving, including the grams (g) of carbohydrate per serving. To use the "Nutrition Facts":  Decide how many servings you will have.  Multiply the number of servings by the number of carbohydrates per serving.  The resulting number is the total amount of carbohydrates that you will be having. Learning standard serving sizes of other foods When you eat carbohydrate foods that are not packaged or do not include "Nutrition Facts" on the label, you need to measure the servings in order to count the amount of carbohydrates:  Measure the foods that you will eat with a food scale or measuring cup, if  needed.  Decide how many standard-size servings you will eat.  Multiply the number of servings by 15. Most carbohydrate-rich foods have about 15 g of carbohydrates per serving. ? For example, if you eat 8 oz (170 g) of strawberries, you will have eaten 2 servings and 30 g of carbohydrates (2 servings x 15 g = 30 g).  For foods that have more than one food mixed, such as soups and casseroles, you must count the carbohydrates in each food that is included. The following list contains standard serving sizes of common carbohydrate-rich foods. Each of these servings has about 15 g of carbohydrates:   hamburger bun or  English muffin.   oz (15 mL) syrup.   oz (14 g) jelly.  1 slice of bread.  1 six-inch tortilla.  3 oz (85 g) cooked rice or pasta.  4 oz (113 g) cooked dried beans.  4 oz (113 g) starchy vegetable, such as peas, corn, or potatoes.  4 oz (113 g) hot cereal.  4 oz (113 g) mashed potatoes or  of a large baked potato.  4 oz (113 g) canned or frozen fruit.  4 oz (120 mL) fruit juice.  4-6 crackers.  6 chicken nuggets.  6 oz (170 g) unsweetened dry cereal.  6 oz (170 g) plain fat-free yogurt or yogurt sweetened with artificial sweeteners.  8 oz (240 mL) milk.  8 oz (170 g) fresh fruit or one small piece of fruit.  24 oz (680 g) popped popcorn. Example of carbohydrate counting Sample meal  3 oz (85 g) chicken breast.  6 oz (170 g)   brown rice.  4 oz (113 g) corn.  8 oz (240 mL) milk.  8 oz (170 g) strawberries with sugar-free whipped topping. Carbohydrate calculation 1. Identify the foods that contain carbohydrates: ? Rice. ? Corn. ? Milk. ? Strawberries. 2. Calculate how many servings you have of each food: ? 2 servings rice. ? 1 serving corn. ? 1 serving milk. ? 1 serving strawberries. 3. Multiply each number of servings by 15 g: ? 2 servings rice x 15 g = 30 g. ? 1 serving corn x 15 g = 15 g. ? 1 serving milk x 15 g = 15 g. ? 1  serving strawberries x 15 g = 15 g. 4. Add together all of the amounts to find the total grams of carbohydrates eaten: ? 30 g + 15 g + 15 g + 15 g = 75 g of carbohydrates total. Summary  Carbohydrate counting is a method of keeping track of how many carbohydrates you eat.  Eating carbohydrates naturally increases the amount of sugar (glucose) in the blood.  Counting how many carbohydrates you eat helps keep your blood glucose within normal limits, which helps you manage your diabetes.  A diet and nutrition specialist (registered dietitian) can help you make a meal plan and calculate how many carbohydrates you should have at each meal and snack. This information is not intended to replace advice given to you by your health care provider. Make sure you discuss any questions you have with your health care provider. Document Released: 09/26/2005 Document Revised: 04/05/2017 Document Reviewed: 03/09/2016 Elsevier Interactive Patient Education  2019 [employer]  Plantar Warts Plantar warts are small growths on the bottom of the foot (sole). Warts are caused by a type of germ (virus). Most warts are not painful, and they usually do not cause problems. Sometimes, plantar warts can cause pain when you walk. Warts often go away on their own in time. They can also spread to other areas of the body. Treatments may be done if needed. What are the causes?  Plantar warts are caused by a germ that is called human papillomavirus (HPV). ? Walking barefoot can cause exposure to the germ, especially if your feet are wet. ? Warts happen when HPV attacks a break in the skin of the foot. What increases the risk?  Being between 61-9 years of age.  Using public showers or locker rooms.  Having a weakened body defense system (immune system). What are the signs or symptoms?   Flat or slightly raised growths that have a rough surface and look like a callus.  Pain when you use your foot to support your  body weight. How is this treated? In many cases, warts do not need treatment. Without treatment, they often go away with time. If treatment is needed or wanted, options may include:  Applying medicated solutions, creams, or patches to the wart. These make the skin soft so that layers will slowly shed away.  Freezing the wart with liquid nitrogen (cryotherapy).  Burning the wart with: ? Laser treatment. ? An electrified probe (electrocautery).  Injecting a medicine (Candida antigen) into the wart to help the body's defense system fight off the wart.  Having surgery to remove the wart.  Putting duct tape over the top of the wart (occlusion). You will leave the tape in place for as long as told by your doctor. Then you will replace it with a new strip of tape. This is done until the wart goes away. Repeat treatment may  be needed if you choose to remove warts. Warts sometimes go away and come back again. Follow these instructions at home: General instructions  Apply creams or solutions only as told by your doctor. Follow these steps if your doctor tells you to do so: ? Soak your foot in warm water. ? Remove the top layer of softened skin before you apply the medicine. You can use a pumice stone to remove the skin. ? After you apply the medicine, put a bandage over the area of the wart. ? Repeat the process every day or as told by your doctor.  Do not scratch or pick at a wart.  Wash your hands after you touch a wart.  If a wart hurts, try covering it with a bandage that has a hole in the middle.  Keep all follow-up visits as told by your doctor. This is important. How is this prevented?   Wear shoes and socks. Change your socks every day.  Keep your feet clean and dry.  Check your feet often.  Do not walk barefoot in: ? Shared locker rooms. ? Shower areas. ? Swimming pools.  Avoid direct contact with warts on other people. Contact a doctor if:  Your warts do not improve  after treatment.  You have redness, swelling, or pain at the site of a wart.  You have bleeding from a wart, and the bleeding does not stop when you put light pressure on the wart.  You have diabetes and you get a wart. Summary  Warts are small growths on the skin.  When warts happen on the bottom of the foot (sole), they are called plantar warts.  In many cases, warts do not need treatment.  Apply creams or solutions only as told by your doctor.  Do not scratch or pick at a wart. Wash your hands after you touch a wart. This information is not intended to replace advice given to you by your health care provider. Make sure you discuss any questions you have with your health care provider. Document Released: 10/29/2010 Document Revised: 04/24/2018 Document Reviewed: 12/22/2014 Elsevier Interactive Patient Education  2019 [employer].

## 2018-12-14 ENCOUNTER — Ambulatory Visit: Payer: Medicaid Other | Admitting: Physical Therapy

## 2018-12-14 LAB — CMP14 + ANION GAP
ALBUMIN: 4.4 g/dL (ref 3.8–4.8)
ALT: 14 IU/L (ref 0–32)
ANION GAP: 13 mmol/L (ref 10.0–18.0)
AST: 22 IU/L (ref 0–40)
Albumin/Globulin Ratio: 1.5 (ref 1.2–2.2)
Alkaline Phosphatase: 119 IU/L — ABNORMAL HIGH (ref 39–117)
BILIRUBIN TOTAL: 0.2 mg/dL (ref 0.0–1.2)
BUN / CREAT RATIO: 7 — AB (ref 12–28)
BUN: 9 mg/dL (ref 8–27)
CO2: 22 mmol/L (ref 20–29)
CREATININE: 1.26 mg/dL — AB (ref 0.57–1.00)
Calcium: 9.5 mg/dL (ref 8.7–10.3)
Chloride: 104 mmol/L (ref 96–106)
GFR calc non Af Amer: 46 mL/min/{1.73_m2} — ABNORMAL LOW (ref >59)
GFR, EST AFRICAN AMERICAN: 53 mL/min/{1.73_m2} — AB (ref >59)
GLOBULIN, TOTAL: 2.9 g/dL (ref 1.5–4.5)
Glucose: 82 mg/dL (ref 65–99)
Potassium: 4 mmol/L (ref 3.5–5.2)
Sodium: 139 mmol/L (ref 134–144)
TOTAL PROTEIN: 7.3 g/dL (ref 6.0–8.5)

## 2018-12-14 LAB — CBC
HEMATOCRIT: 35.3 % (ref 34.0–46.6)
Hemoglobin: 11.3 g/dL (ref 11.1–15.9)
MCH: 24.8 pg — ABNORMAL LOW (ref 26.6–33.0)
MCHC: 32 g/dL (ref 31.5–35.7)
MCV: 77 fL — AB (ref 79–97)
Platelets: 148 10*3/uL — ABNORMAL LOW (ref 150–450)
RBC: 4.56 x10E6/uL (ref 3.77–5.28)
RDW: 15.2 % (ref 11.7–15.4)
WBC: 6.9 10*3/uL (ref 3.4–10.8)

## 2018-12-14 LAB — HEMOGLOBIN A1C
Est. average glucose Bld gHb Est-mCnc: 154 mg/dL
Hgb A1c MFr Bld: 7 % — ABNORMAL HIGH (ref 4.8–5.6)

## 2018-12-14 LAB — LIPID PANEL
CHOL/HDL RATIO: 2.5 ratio (ref 0.0–4.4)
Cholesterol, Total: 101 mg/dL (ref 100–199)
HDL: 41 mg/dL (ref >39)
LDL CALC: 48 mg/dL (ref 0–99)
Triglycerides: 60 mg/dL (ref 0–149)
VLDL CHOLESTEROL CAL: 12 mg/dL (ref 5–40)

## 2018-12-18 ENCOUNTER — Encounter: Payer: Medicaid Other | Admitting: Physical Therapy

## 2018-12-19 ENCOUNTER — Other Ambulatory Visit: Payer: Self-pay | Admitting: Internal Medicine

## 2018-12-19 DIAGNOSIS — Z794 Long term (current) use of insulin: Principal | ICD-10-CM

## 2018-12-19 DIAGNOSIS — E11649 Type 2 diabetes mellitus with hypoglycemia without coma: Secondary | ICD-10-CM

## 2018-12-19 MED ORDER — METFORMIN HCL 500 MG PO TABS: ORAL_TABLET | ORAL | 0 refills | Status: DC

## 2018-12-24 ENCOUNTER — Telehealth: Payer: Self-pay

## 2018-12-24 NOTE — Telephone Encounter (Signed)
PA STARTED FOR OZEMPIC

## 2018-12-31 ENCOUNTER — Ambulatory Visit: Payer: Medicaid Other | Admitting: Nurse Practitioner

## 2019-01-21 ENCOUNTER — Telehealth: Payer: Self-pay

## 2019-01-21 NOTE — Telephone Encounter (Signed)
Patient called stating she called the pharmacy regarding her ozempic because it needs a PA I advised pt that Karilyn Cota PA-C faxed over the PA and we have to call medicaid to check the status of it so we will give her a call to let her know whether it was approved or not. YRL,RMA

## 2019-01-22 ENCOUNTER — Other Ambulatory Visit: Payer: Self-pay

## 2019-01-22 MED ORDER — INSULIN DETEMIR 100 UNIT/ML FLEXPEN: 40.0000 [IU] | PEN_INJECTOR | Freq: Every day | SUBCUTANEOUS | 1 refills | Status: DC

## 2019-01-23 ENCOUNTER — Other Ambulatory Visit: Payer: Self-pay | Admitting: Nurse Practitioner

## 2019-02-20 ENCOUNTER — Other Ambulatory Visit: Payer: Self-pay | Admitting: Nurse Practitioner

## 2019-03-06 ENCOUNTER — Other Ambulatory Visit: Payer: Self-pay

## 2019-03-06 ENCOUNTER — Encounter: Payer: Self-pay | Admitting: Nurse Practitioner

## 2019-03-06 ENCOUNTER — Ambulatory Visit (INDEPENDENT_AMBULATORY_CARE_PROVIDER_SITE_OTHER): Payer: Medicaid Other | Admitting: Nurse Practitioner

## 2019-03-06 VITALS — BP 137/81 | Temp 97.7°F

## 2019-03-06 DIAGNOSIS — K219 Gastro-esophageal reflux disease without esophagitis: Secondary | ICD-10-CM | POA: Diagnosis not present

## 2019-03-06 DIAGNOSIS — I10 Essential (primary) hypertension: Secondary | ICD-10-CM | POA: Diagnosis not present

## 2019-03-06 DIAGNOSIS — E1165 Type 2 diabetes mellitus with hyperglycemia: Secondary | ICD-10-CM

## 2019-03-06 DIAGNOSIS — E113293 Type 2 diabetes mellitus with mild nonproliferative diabetic retinopathy without macular edema, bilateral: Secondary | ICD-10-CM | POA: Insufficient documentation

## 2019-03-06 DIAGNOSIS — H35033 Hypertensive retinopathy, bilateral: Secondary | ICD-10-CM | POA: Insufficient documentation

## 2019-03-06 LAB — HM DIABETES EYE EXAM

## 2019-03-06 MED ORDER — OMEPRAZOLE 40 MG PO CPDR: 40.0000 mg | DELAYED_RELEASE_CAPSULE | Freq: Every day | ORAL | 0 refills | Status: DC

## 2019-03-06 MED ORDER — ONDANSETRON HCL 4 MG PO TABS: 4.0000 mg | ORAL_TABLET | Freq: Every day | ORAL | 1 refills | Status: AC | PRN

## 2019-03-06 MED ORDER — MELOXICAM 15 MG PO TABS: 15.0000 mg | ORAL_TABLET | Freq: Every day | ORAL | 0 refills | Status: DC

## 2019-03-06 MED ORDER — PEN NEEDLES 32G X 4 MM MISC: 1.0000 | Freq: Three times a day (TID) | 3 refills | Status: DC

## 2019-03-06 MED ORDER — LISINOPRIL 10 MG PO TABS: 10.0000 mg | ORAL_TABLET | Freq: Every day | ORAL | 1 refills | Status: DC

## 2019-03-06 MED ORDER — ATORVASTATIN CALCIUM 10 MG PO TABS: 10.0000 mg | ORAL_TABLET | Freq: Every day | ORAL | 0 refills | Status: DC

## 2019-03-06 MED ORDER — SEMAGLUTIDE(0.25 OR 0.5MG/DOS) 2 MG/1.5ML ~~LOC~~ SOPN: 0.5000 mg | PEN_INJECTOR | SUBCUTANEOUS | 1 refills | Status: DC

## 2019-03-06 MED ORDER — BACLOFEN 10 MG PO TABS: 10.0000 mg | ORAL_TABLET | Freq: Three times a day (TID) | ORAL | 1 refills | Status: AC | PRN

## 2019-03-06 NOTE — Progress Notes (Signed)
Virtual Visit via Video   This visit type was conducted due to national recommendations for restrictions regarding the COVID-19 Pandemic (e.g. social distancing) in an effort to limit this patient's exposure and mitigate transmission in our community.  Patients identity confirmed using two different identifiers.  This format is felt to be most appropriate for this patient at this time.  All issues noted in this document were discussed and addressed.  No physical exam was performed (except for noted visual exam findings with Video Visits).    Date:  03/06/2019   ID:  Kara Kelley, DOB Nov 06, 1955, MRN [id number]  Patient Location:  [facility]  Provider location:   Office    Chief Complaint:  Diabetes   History of Present Illness:    Kara Kelley is a 63 y.o. female who presents via video conferencing for a telehealth visit today.    The patient does not have symptoms concerning for COVID-19 infection (fever, chills, cough, or new shortness of breath).   Hyperlipidemia - she is in need of a refill on her cholesterol medications  Diabetes  She presents for her follow-up diabetic visit. She has type 2 diabetes mellitus. There are no hypoglycemic associated symptoms. There are no diabetic associated symptoms. There are no hypoglycemic complications. There are no diabetic complications. Risk factors for coronary artery disease include obesity, sedentary lifestyle, dyslipidemia, hypertension and diabetes mellitus. Current diabetic treatment includes oral agent (triple therapy). She is compliant with treatment most of the time. She is following a generally healthy diet. She has not had a previous visit with a dietitian. She rarely participates in exercise. (88 - 135) An ACE inhibitor/angiotensin II receptor blocker is being taken. Eye exam is current (03/06/2019 - next appt next year).  Hypertension  This is a chronic problem. The current episode started more than 1 year ago.  The problem is unchanged. Pertinent negatives include no anxiety. There are no associated agents to hypertension. Risk factors for coronary artery disease include dyslipidemia, obesity, sedentary lifestyle and diabetes mellitus. Past treatments include diuretics. There are no compliance problems.  There is no history of angina. There is no history of chronic renal disease.  Cough  This is a chronic problem. The current episode started more than 1 year ago. The problem has been unchanged. reflux     Past Medical History:  Diagnosis Date  . Anemia   . Anxiety   . Arthritis   . Asthma   . Bunion   . Callus   . Chronic pain   . Cocaine abuse ([place])   . COPD (chronic obstructive pulmonary disease) ([place])   . Corns and callosities   . Degenerative joint disease   . Depression   . Diabetes mellitus   . Endometrial polyp   . ETOH abuse   . Gall stones   . GERD (gastroesophageal reflux disease)   . Headache    history of Migraines  . Hepatitis C    Hep C  . Hyperlipidemia   . Hypertension   . Insomnia   . Spondylolisthesis of lumbar region   . Substance abuse ([place])    alcoholism  . Tuberculosis 1985  . Wears dentures   . Wears glasses    Past Surgical History:  Procedure Laterality Date  . CESAREAN SECTION     x3  . CHOLECYSTECTOMY N/A 08/11/2016   Procedure: LAPAROSCOPIC CHOLECYSTECTOMY WITH   INTRAOPERATIVE CHOLANGIOGRAM;  Surgeon: Stark Klein, MD;  Location: [facility];  Service: General;  Laterality: N/A;  . COLONOSCOPY    . Cotton Osteotomy w/ Graft Left 06/18/2009  . Excision of Benign Lesion Right 01/30/2013   Rt Plantar  . FOOT SURGERY    . HAMMER TOE REPAIR Right 02/12/2016   RIGHT #5  . Hammertoe Repair Left 06/18/2009   Lt #5  . HYSTEROSCOPY N/A 06/20/2017   Procedure: DILATION AND CURETTAGE, HYSTEROSCOPY w/ Polypectomy;  Surgeon: Lavonia Drafts, MD;  Location: [facility];  Service: Gynecology;  Laterality: N/A;  . LUMBAR FUSION  2018  . METATARSAL OSTEOTOMY Left  06/18/2009   #5  . MULTIPLE TOOTH EXTRACTIONS    . Nail Matrixectomy Left 06/18/2009   LT #1  . OSTEOTOMY Right 01/30/2013   Rt #5  . Phalangectomy Left 06/18/2009   LT #1  . Phalangectomy Right 01/30/2013   Rt #1  . TUBAL LIGATION       Current Meds  Medication Sig  . albuterol (PROVENTIL HFA;VENTOLIN HFA) 108 (90 Base) MCG/ACT inhaler Inhale 2 puffs into the lungs every 6 (six) hours as needed. For shortness of breath.  . Blood Glucose Monitoring Suppl (ACCU-CHEK NANO SMARTVIEW) w/Device KIT 1 each by Does not apply route 3 (three) times daily.  . buprenorphine-naloxone (SUBOXONE) 2-0.5 mg SUBL SL tablet Place 1 tablet under the tongue daily.  [name] buPROPion (WELLBUTRIN XL) 300 MG 24 hr tablet Take 300 mg by mouth every morning.  [name] glucose blood test strip Use as instructed  . Insulin Detemir (LEVEMIR FLEXTOUCH) 100 UNIT/ML Pen Inject 40 Units into the skin at bedtime.  [name] lisinopril (PRINIVIL,ZESTRIL) 10 MG tablet Take 1 tablet (10 mg total) by mouth daily.  . metFORMIN (GLUCOPHAGE) 500 MG tablet One qd with meals( dose reduced due to worsening creatinine)  . NOVOLOG FLEXPEN 100 UNIT/ML FlexPen ADMINISTER 18 UNITS UNDER THE SKIN THREE TIMES DAILY AS NEEDED FOR HIGH BLOOD SUGAR  . omeprazole (PRILOSEC) 40 MG capsule Take 1 capsule (40 mg total) by mouth daily before breakfast.  . Semaglutide,0.25 or 0.5MG/DOS, (OZEMPIC, 0.25 OR 0.5 MG/DOSE,) 2 MG/1.5ML SOPN Inject 0.5 mg into the skin once a week.  . spironolactone (ALDACTONE) 50 MG tablet Take 1 tablet (50 mg total) by mouth daily.  . traZODone (DESYREL) 100 MG tablet Take 100 mg by mouth at bedtime.  . [DISCONTINUED] atorvastatin (LIPITOR) 10 MG tablet Take 1 tablet (10 mg total) by mouth daily.  . [DISCONTINUED] baclofen (LIORESAL) 10 MG tablet Take 1 tablet (10 mg total) by mouth 3 (three) times daily.  . [DISCONTINUED] Insulin Pen Needle (PEN NEEDLES) 32G X 4 MM MISC 1 each by Does not apply route 3 (three) times daily.  . [DISCONTINUED]  meloxicam (MOBIC) 15 MG tablet Take 1 tablet (15 mg total) by mouth daily.  . [DISCONTINUED] ondansetron (ZOFRAN) 4 MG tablet Take 1 tablet (4 mg total) by mouth daily as needed for nausea or vomiting. From Ozempic     Allergies:   Patient has no known allergies.   Social History   Tobacco Use  . Smoking status: Former Smoker    Packs/day: 0.50    Types: Cigarettes    Last attempt to quit: 04/10/2017    Years since quitting: 1.9  . Smokeless tobacco: Current User  Substance Use Topics  . Alcohol use: No    Alcohol/week: 0.0 standard drinks    Comment: in rehab  . Drug use: No    Comment: in rehab     Family Hx: The patient's family history includes Cirrhosis in some other  family members; Diabetes in an other family member; Heart disease in her mother. There is no history of Colon cancer or Breast cancer.  ROS:   Please see the history of present illness.    Review of Systems  Constitutional: Negative.   Eyes: Negative.   Respiratory: Positive for cough.     All other systems reviewed and are negative.   Labs/Other Tests and Data Reviewed:    Recent Labs: 12/13/2018: ALT 14; BUN 9; Creatinine, Ser 1.26; Hemoglobin 11.3; Platelets 148; Potassium 4.0; Sodium 139   Recent Lipid Panel Lab Results  Component Value Date/Time   CHOL 101 12/13/2018 10:11 AM   TRIG 60 12/13/2018 10:11 AM   HDL 41 12/13/2018 10:11 AM   CHOLHDL 2.5 12/13/2018 10:11 AM   LDLCALC 48 12/13/2018 10:11 AM    Wt Readings from Last 3 Encounters:  12/13/18 207 lb 6.4 oz (94.1 kg)  11/29/18 216 lb 12.8 oz (98.3 kg)  11/27/18 216 lb 6.4 oz (98.2 kg)     Exam:    Vital Signs:  BP 137/81 (BP Location: Left Arm, Patient Position: Sitting, Cuff Size: Large)   Temp 97.7 F (36.5 C) (Oral)     Physical Exam  Constitutional: She is oriented to person, place, and time and well-developed, well-nourished, and in no distress.  Neurological: She is alert and oriented to person, place, and time.   Psychiatric: Mood, memory, affect and judgment normal.    ASSESSMENT & PLAN:    1. Gastroesophageal reflux disease, esophagitis presence not specified  Continue with omeprazole 40 mg daily - omeprazole (PRILOSEC) 40 MG capsule; Take 1 capsule (40 mg total) by mouth daily before breakfast.  Dispense: 90 capsule; Refill: 0  2. Uncontrolled type 2 diabetes mellitus with hyperglycemia (HCC)  Chronic, controlled  She was doing well with ozempic will try to get appoved  Continue with current medications  Encouraged to limit intake of sugary foods and drinks  Encouraged to increase physical activity to 150 minutes per week - Semaglutide,0.25 or 0.5MG/DOS, (OZEMPIC, 0.25 OR 0.5 MG/DOSE,) 2 MG/1.5ML SOPN; Inject 0.5 mg into the skin once a week.  Dispense: 3 pen; Refill: 1 - Hemoglobin A1c; Future - CMP14 + Anion Gap; Future - Lipid Profile; Future  3. Essential hypertension . No blood pressure this visit due to virtual . CMP ordered to check renal function.  . The importance of regular exercise and dietary modification was stressed to the patient.  - lisinopril (ZESTRIL) 10 MG tablet; Take 1 tablet (10 mg total) by mouth daily.  Dispense: 90 tablet; Refill: 1    COVID-19 Education: The signs and symptoms of COVID-19 were discussed with the patient and how to seek care for testing (follow up with PCP or arrange E-visit).  The importance of social distancing was discussed today.  Patient Risk:   After full review of this patients clinical status, I feel that they are at least moderate risk at this time.  Time:   Today, I have spent 15 minutes/ seconds with the patient with telehealth technology discussing above diagnoses.     Medication Adjustments/Labs and Tests Ordered: Current medicines are reviewed at length with the patient today.  Concerns regarding medicines are outlined above.   Tests Ordered: No orders of the defined types were placed in this encounter.   Medication  Changes: No orders of the defined types were placed in this encounter.   Disposition:  Follow up in 3 month(s)  Signed, [name], FNP

## 2019-03-07 ENCOUNTER — Other Ambulatory Visit: Payer: Medicaid Other

## 2019-03-07 ENCOUNTER — Other Ambulatory Visit: Payer: Self-pay

## 2019-03-07 DIAGNOSIS — E1165 Type 2 diabetes mellitus with hyperglycemia: Secondary | ICD-10-CM

## 2019-03-08 LAB — CMP14 + ANION GAP
ALT: 13 IU/L (ref 0–32)
AST: 17 IU/L (ref 0–40)
Albumin/Globulin Ratio: 1.7 (ref 1.2–2.2)
Albumin: 4.4 g/dL (ref 3.8–4.8)
Alkaline Phosphatase: 107 IU/L (ref 39–117)
Anion Gap: 17 mmol/L (ref 10.0–18.0)
BUN/Creatinine Ratio: 8 — ABNORMAL LOW (ref 12–28)
BUN: 10 mg/dL (ref 8–27)
Bilirubin Total: 0.3 mg/dL (ref 0.0–1.2)
CO2: 19 mmol/L — ABNORMAL LOW (ref 20–29)
Calcium: 9.5 mg/dL (ref 8.7–10.3)
Chloride: 105 mmol/L (ref 96–106)
Creatinine, Ser: 1.2 mg/dL — ABNORMAL HIGH (ref 0.57–1.00)
GFR calc Af Amer: 56 mL/min/{1.73_m2} — ABNORMAL LOW (ref >59)
GFR calc non Af Amer: 49 mL/min/{1.73_m2} — ABNORMAL LOW (ref >59)
Globulin, Total: 2.6 g/dL (ref 1.5–4.5)
Glucose: 103 mg/dL — ABNORMAL HIGH (ref 65–99)
Potassium: 4.7 mmol/L (ref 3.5–5.2)
Sodium: 141 mmol/L (ref 134–144)
Total Protein: 7 g/dL (ref 6.0–8.5)

## 2019-03-08 LAB — HEMOGLOBIN A1C
Est. average glucose Bld gHb Est-mCnc: 128 mg/dL
Hgb A1c MFr Bld: 6.1 % — ABNORMAL HIGH (ref 4.8–5.6)

## 2019-03-08 LAB — LIPID PANEL
Chol/HDL Ratio: 2.1 ratio (ref 0.0–4.4)
Cholesterol, Total: 105 mg/dL (ref 100–199)
HDL: 50 mg/dL (ref >39)
LDL Calculated: 42 mg/dL (ref 0–99)
Triglycerides: 64 mg/dL (ref 0–149)
VLDL Cholesterol Cal: 13 mg/dL (ref 5–40)

## 2019-03-15 ENCOUNTER — Encounter: Payer: Self-pay | Admitting: Nurse Practitioner

## 2019-03-18 NOTE — Progress Notes (Signed)
Okay we will continue to monitor

## 2019-03-19 ENCOUNTER — Telehealth: Payer: Self-pay

## 2019-03-19 NOTE — Telephone Encounter (Signed)
Patient called wanting to know why we were going to monitor her kidney functions and I returned her call and told her with her having diabetes It can lead to having kidney problems and we just want to monitor it and make sure it is ok. YRL,RMA

## 2019-04-02 ENCOUNTER — Other Ambulatory Visit: Payer: Self-pay

## 2019-04-02 MED ORDER — NOVOLOG FLEXPEN 100 UNIT/ML ~~LOC~~ SOPN: PEN_INJECTOR | SUBCUTANEOUS | 0 refills | Status: DC

## 2019-04-02 MED ORDER — LEVEMIR FLEXTOUCH 100 UNIT/ML ~~LOC~~ SOPN: 40.0000 [IU] | PEN_INJECTOR | Freq: Every day | SUBCUTANEOUS | 1 refills | Status: DC

## 2019-04-22 ENCOUNTER — Ambulatory Visit: Payer: Medicaid Other | Admitting: Nurse Practitioner

## 2019-05-27 ENCOUNTER — Other Ambulatory Visit: Payer: Self-pay

## 2019-05-27 DIAGNOSIS — I10 Essential (primary) hypertension: Secondary | ICD-10-CM

## 2019-05-27 MED ORDER — SPIRONOLACTONE 50 MG PO TABS: 50.0000 mg | ORAL_TABLET | Freq: Every day | ORAL | 1 refills | Status: DC

## 2019-06-06 ENCOUNTER — Ambulatory Visit: Payer: Medicaid Other | Admitting: Nurse Practitioner

## 2019-06-06 ENCOUNTER — Telehealth: Payer: Self-pay

## 2019-06-06 NOTE — Telephone Encounter (Signed)
Patient called wanting to reschedule her appointment because she is unable to make it today. I have left pt a v/m to call the office so we can reschedule it. YRL,RMA

## 2019-06-18 ENCOUNTER — Other Ambulatory Visit: Payer: Self-pay

## 2019-06-19 ENCOUNTER — Encounter: Payer: Self-pay | Admitting: Nurse Practitioner

## 2019-06-19 ENCOUNTER — Other Ambulatory Visit (HOSPITAL_COMMUNITY)
Admission: RE | Admit: 2019-06-19 | Discharge: 2019-06-19 | Disposition: A | Discharge status: Home / Self Care | Payer: Medicaid Other | Source: Ambulatory Visit | Attending: Nurse Practitioner | Admitting: Nurse Practitioner

## 2019-06-19 ENCOUNTER — Ambulatory Visit (INDEPENDENT_AMBULATORY_CARE_PROVIDER_SITE_OTHER): Payer: Medicaid Other | Admitting: Nurse Practitioner

## 2019-06-19 ENCOUNTER — Ambulatory Visit: Payer: Self-pay | Admitting: Nurse Practitioner

## 2019-06-19 VITALS — BP 160/90 | HR 72 | Temp 98.9°F | Ht 60.2 in | Wt 201.2 lb

## 2019-06-19 DIAGNOSIS — N183 Chronic kidney disease, stage 3 unspecified: Secondary | ICD-10-CM

## 2019-06-19 DIAGNOSIS — Z0000 Encounter for general adult medical examination without abnormal findings: Secondary | ICD-10-CM

## 2019-06-19 DIAGNOSIS — Z124 Encounter for screening for malignant neoplasm of cervix: Secondary | ICD-10-CM

## 2019-06-19 DIAGNOSIS — I129 Hypertensive chronic kidney disease with stage 1 through stage 4 chronic kidney disease, or unspecified chronic kidney disease: Secondary | ICD-10-CM | POA: Diagnosis not present

## 2019-06-19 DIAGNOSIS — Z23 Encounter for immunization: Secondary | ICD-10-CM

## 2019-06-19 DIAGNOSIS — E1165 Type 2 diabetes mellitus with hyperglycemia: Secondary | ICD-10-CM | POA: Insufficient documentation

## 2019-06-19 DIAGNOSIS — E1122 Type 2 diabetes mellitus with diabetic chronic kidney disease: Secondary | ICD-10-CM

## 2019-06-19 DIAGNOSIS — Z794 Long term (current) use of insulin: Secondary | ICD-10-CM

## 2019-06-19 DIAGNOSIS — I10 Essential (primary) hypertension: Secondary | ICD-10-CM

## 2019-06-19 LAB — POCT URINALYSIS DIPSTICK
Bilirubin, UA: NEGATIVE
Glucose, UA: NEGATIVE
Ketones, UA: NEGATIVE
Leukocytes, UA: NEGATIVE
Nitrite, UA: NEGATIVE
Protein, UA: POSITIVE — AB
Spec Grav, UA: 1.015 (ref 1.010–1.025)
Urobilinogen, UA: 0.2 E.U./dL
pH, UA: 6.5 (ref 5.0–8.0)

## 2019-06-19 LAB — POCT UA - MICROALBUMIN
Albumin/Creatinine Ratio, Urine, POC: 300
Creatinine, POC: 200 mg/dL
Microalbumin Ur, POC: 80 mg/L

## 2019-06-19 MED ORDER — LISINOPRIL 10 MG PO TABS: 10.0000 mg | ORAL_TABLET | Freq: Every day | ORAL | 1 refills | Status: DC

## 2019-06-19 MED ORDER — LEVEMIR FLEXTOUCH 100 UNIT/ML ~~LOC~~ SOPN: 40.0000 [IU] | PEN_INJECTOR | Freq: Every day | SUBCUTANEOUS | 1 refills | Status: DC

## 2019-06-19 NOTE — Progress Notes (Addendum)
Subjective:     Patient ID: Kara Kelley , female    DOB: Jun 13, 1956 , 63 y.o.   MRN: [id number]   Chief Complaint  Patient presents with  . Annual Exam    HPI The patient states she uses post menopausal status for birth control.  No LMP recorded. Patient is postmenopausal. Mammogram last done 07/2018.  Negative for: breast discharge, breast lump(s), breast pain and breast self exam.  Pertinent negatives include abnormal bleeding (hematology), anxiety, decreased libido, depression, difficulty falling sleep, dyspareunia, history of infertility, nocturia, sexual dysfunction, sleep disturbances, urinary incontinence, urinary urgency, vaginal discharge and vaginal itching. Diet regular. The patient states her exercise level is  minimally.     The patient's tobacco use is:  Social History   Tobacco Use  Smoking Status Former Smoker  . Packs/day: 0.50  . Types: Cigarettes  . Quit date: 04/10/2017  . Years since quitting: 2.1  Smokeless Tobacco Current User   She has been exposed to passive smoke. The patient's alcohol use is:  Social History   Substance and Sexual Activity  Alcohol Use No  . Alcohol/week: 0.0 standard drinks   Comment: in rehab   Additional information: Last pap unsure  Here for HM -  She is going to [facility]  Diabetes She presents for her follow-up diabetic visit. She has type 2 diabetes mellitus. Her disease course has been stable. There are no hypoglycemic associated symptoms. Pertinent negatives for hypoglycemia include no dizziness or headaches. There are no diabetic associated symptoms. There are no hypoglycemic complications. Symptoms are stable. There are no diabetic complications. Risk factors for coronary artery disease include obesity. Current diabetic treatment includes oral agent (dual therapy). She is compliant with treatment all of the time. She is following a generally unhealthy diet. When asked about meal planning, she reported none. She  never participates in exercise. (Has been up to 180 and the last 4 days has been reading error.) She does not see a podiatrist.Eye exam is current.     Past Medical History:  Diagnosis Date  . Anemia   . Anxiety   . Arthritis   . Asthma   . Bunion   . Callus   . Chronic pain   . Cocaine abuse ([place])   . COPD (chronic obstructive pulmonary disease) ([place])   . Corns and callosities   . Degenerative joint disease   . Depression   . Diabetes mellitus   . Endometrial polyp   . ETOH abuse   . Gall stones   . GERD (gastroesophageal reflux disease)   . Headache    history of Migraines  . Hepatitis C    Hep C  . Hyperlipidemia   . Hypertension   . Insomnia   . Spondylolisthesis of lumbar region   . Substance abuse ([place])    alcoholism  . Tuberculosis 1985  . Wears dentures   . Wears glasses      Family History  Problem Relation Age of Onset  . Heart disease Mother   . Diabetes Other        mat great aunt  . Cirrhosis Other        mat great aunt  . Cirrhosis Other        mat great uncles x 2  . Colon cancer Neg Hx   . Breast cancer Neg Hx      Current Outpatient Medications:  .  albuterol (PROVENTIL HFA;VENTOLIN HFA) 108 (90 Base) MCG/ACT inhaler, Inhale 2 puffs  into the lungs every 6 (six) hours as needed. For shortness of breath., Disp: 18 g, Rfl: 1 .  buPROPion (WELLBUTRIN XL) 300 MG 24 hr tablet, Take 300 mg by mouth every morning., Disp: , Rfl: 0 .  Insulin Detemir (LEVEMIR FLEXTOUCH) 100 UNIT/ML Pen, Inject 40 Units into the skin at bedtime., Disp: 15 pen, Rfl: 1 .  Insulin Pen Needle (PEN NEEDLES) 32G X 4 MM MISC, 1 each by Does not apply route 3 (three) times daily., Disp: 200 each, Rfl: 3 .  lisinopril (ZESTRIL) 10 MG tablet, Take 1 tablet (10 mg total) by mouth daily., Disp: 90 tablet, Rfl: 1 .  meloxicam (MOBIC) 15 MG tablet, Take 1 tablet (15 mg total) by mouth daily., Disp: 90 tablet, Rfl: 0 .  metFORMIN (GLUCOPHAGE) 500 MG tablet, One qd with meals( dose  reduced due to worsening creatinine), Disp: 1 tablet, Rfl: 0 .  NOVOLOG FLEXPEN 100 UNIT/ML FlexPen, ADMINISTER 18 UNITS UNDER THE SKIN THREE TIMES DAILY AS NEEDED FOR HIGH BLOOD SUGAR, Disp: 15 mL, Rfl: 0 .  omeprazole (PRILOSEC) 40 MG capsule, Take 1 capsule (40 mg total) by mouth daily before breakfast., Disp: 90 capsule, Rfl: 0 .  spironolactone (ALDACTONE) 50 MG tablet, Take 1 tablet (50 mg total) by mouth daily., Disp: 30 tablet, Rfl: 1 .  traZODone (DESYREL) 100 MG tablet, Take 100 mg by mouth at bedtime., Disp: , Rfl:  .  atorvastatin (LIPITOR) 10 MG tablet, Take 1 tablet (10 mg total) by mouth daily. (Patient not taking: Reported on 06/19/2019), Disp: 90 tablet, Rfl: 0 .  baclofen (LIORESAL) 10 MG tablet, Take 1 tablet (10 mg total) by mouth 3 (three) times daily as needed for muscle spasms. (Patient not taking: Reported on 06/19/2019), Disp: 30 tablet, Rfl: 1 .  Blood Glucose Monitoring Suppl (ACCU-CHEK NANO SMARTVIEW) w/Device KIT, 1 each by Does not apply route 3 (three) times daily. (Patient not taking: Reported on 06/19/2019), Disp: 1 kit, Rfl: 0 .  buprenorphine-naloxone (SUBOXONE) 2-0.5 mg SUBL SL tablet, Place 1 tablet under the tongue daily., Disp: , Rfl:  .  glucose blood test strip, Use as instructed (Patient not taking: Reported on 06/19/2019), Disp: 300 each, Rfl: 3 .  ondansetron (ZOFRAN) 4 MG tablet, Take 1 tablet (4 mg total) by mouth daily as needed for nausea or vomiting. From Ozempic (Patient not taking: Reported on 06/19/2019), Disp: 30 tablet, Rfl: 1 .  Semaglutide,0.25 or 0.5MG/DOS, (OZEMPIC, 0.25 OR 0.5 MG/DOSE,) 2 MG/1.5ML SOPN, Inject 0.5 mg into the skin once a week. (Patient not taking: Reported on 06/19/2019), Disp: 3 pen, Rfl: 1   No Known Allergies   Review of Systems  Constitutional: Negative.   HENT: Negative.   Eyes: Negative.   Respiratory: Negative.   Cardiovascular: Negative.   Gastrointestinal: Negative.   Endocrine: Negative.   Genitourinary: Negative.    Musculoskeletal: Negative.   Skin: Negative.   Allergic/Immunologic: Negative.   Neurological: Negative.  Negative for dizziness and headaches.  Hematological: Negative.   Psychiatric/Behavioral: Negative.      Today's Vitals   06/19/19 1120  BP: (!) 160/90  Pulse: 72  Temp: 98.9 F (37.2 C)  TempSrc: Oral  Weight: 201 lb 3.2 oz (91.3 kg)  Height: 5' 0.2" (1.529 m)  PainSc: 8   PainLoc: Back   Body mass index is 39.03 kg/m.   Objective:  Physical Exam Constitutional:      Appearance: Normal appearance. She is well-developed.  HENT:     Head: Normocephalic and  atraumatic.     Right Ear: Hearing, tympanic membrane, ear canal and external ear normal.     Left Ear: Hearing and external ear normal. There is impacted cerumen (hard wax present to canal).     Nose: Nose normal.     Mouth/Throat:     Mouth: Mucous membranes are moist.  Eyes:     General: Lids are normal.     Extraocular Movements: Extraocular movements intact.     Conjunctiva/sclera: Conjunctivae normal.     Pupils: Pupils are equal, round, and reactive to light.     Funduscopic exam:    Right eye: No papilledema.        Left eye: No papilledema.  Neck:     Musculoskeletal: Full passive range of motion without pain, normal range of motion and neck supple.     Thyroid: No thyroid mass.     Vascular: No carotid bruit.  Cardiovascular:     Rate and Rhythm: Normal rate and regular rhythm.     Pulses: Normal pulses.     Heart sounds: Normal heart sounds. No murmur.  Pulmonary:     Effort: Pulmonary effort is normal.     Breath sounds: Normal breath sounds.  Abdominal:     General: Abdomen is flat. Bowel sounds are normal.     Palpations: Abdomen is soft.  Genitourinary:    Vagina: Normal.     Cervix: Normal.     Adnexa: Right adnexa normal and left adnexa normal.       Right: No mass.    Musculoskeletal: Normal range of motion.        General: No swelling.     Right lower leg: No edema.     Left  lower leg: No edema.  Skin:    General: Skin is warm and dry.     Capillary Refill: Capillary refill takes less than 2 seconds.  Neurological:     General: No focal deficit present.     Mental Status: She is alert and oriented to person, place, and time.     Cranial Nerves: No cranial nerve deficit.     Sensory: No sensory deficit.  Psychiatric:        Mood and Affect: Mood normal.        Behavior: Behavior normal.        Thought Content: Thought content normal.        Judgment: Judgment normal.         Assessment And Plan:    1. Health maintenance examination . Behavior modifications discussed and diet history reviewed.   . Pt will continue to exercise regularly and modify diet with low GI, plant based foods and decrease intake of processed foods.  . Recommend intake of daily multivitamin, Vitamin D, and calcium.  . Recommend  for preventive screenings, as well as recommend immunizations that include influenza, TDAP  2. Essential hypertension . B/P is fair control.  . CMP ordered to check renal function.  . The importance of regular exercise and dietary modification was stressed to the patient.  . Stressed importance of losing ten percent of her body weight to help with B/P control.  . The weight loss would help with decreasing cardiac and cancer risk as well.  . EKG done, HR 72 no changes from previous - POCT Urinalysis Dipstick (81002) - POCT UA - Microalbumin - EKG 12-Lead - BMP8+eGFR - lisinopril (ZESTRIL) 10 MG tablet; Take 1 tablet (10 mg total) by mouth daily.  Dispense:  90 tablet; Refill: 1  3. Type 2 diabetes mellitus with stage 3 chronic kidney disease, with long-term current use of insulin (HCC)  Chronic, controlled  Continue with current medications  Encouraged to limit intake of sugary foods and drinks  Encouraged to increase physical activity to 150 minutes per week - Referral to Chronic Care Management Services - Hemoglobin A1c; Future - BMP8+eGFR -  Insulin Detemir (LEVEMIR FLEXTOUCH) 100 UNIT/ML Pen; Inject 40 Units into the skin at bedtime.  Dispense: 15 pen; Refill: 1  4. Need for influenza vaccination  Influenza vaccine given in office  Advised to take Tylenol as needed for muscle aches or fever - Flu Vaccine QUAD 6+ mos PF IM (Fluarix Quad PF)  5. Encounter for Papanicolaou smear of cervix  No abnormal findings on physical exam - Cytology -Pap Smear   [name], FNP    THE PATIENT IS ENCOURAGED TO PRACTICE SOCIAL DISTANCING DUE TO THE COVID-19 PANDEMIC.

## 2019-06-20 LAB — BMP8+EGFR
BUN/Creatinine Ratio: 9 — ABNORMAL LOW (ref 12–28)
BUN: 10 mg/dL (ref 8–27)
CO2: 24 mmol/L (ref 20–29)
Calcium: 9.9 mg/dL (ref 8.7–10.3)
Chloride: 106 mmol/L (ref 96–106)
Creatinine, Ser: 1.07 mg/dL — ABNORMAL HIGH (ref 0.57–1.00)
GFR calc Af Amer: 65 mL/min/{1.73_m2} (ref >59)
GFR calc non Af Amer: 56 mL/min/{1.73_m2} — ABNORMAL LOW (ref >59)
Glucose: 62 mg/dL — ABNORMAL LOW (ref 65–99)
Potassium: 3.9 mmol/L (ref 3.5–5.2)
Sodium: 141 mmol/L (ref 134–144)

## 2019-06-20 LAB — HEMOGLOBIN A1C
Est. average glucose Bld gHb Est-mCnc: 151 mg/dL
Hgb A1c MFr Bld: 6.9 % — ABNORMAL HIGH (ref 4.8–5.6)

## 2019-06-25 ENCOUNTER — Telehealth: Payer: Self-pay

## 2019-06-25 LAB — CYTOLOGY - PAP: Diagnosis: NEGATIVE

## 2019-06-25 NOTE — Telephone Encounter (Signed)
Patient called requesting a refill on her medications.  I HAVE RETURNED HER A CALL AND LEFT HER A V/M TO CALL THE PHARMACY AND HAVE THEM SEND OVER A REFILL REQUEST. Kara Kelley

## 2019-06-26 ENCOUNTER — Ambulatory Visit: Payer: Self-pay

## 2019-06-26 DIAGNOSIS — E1122 Type 2 diabetes mellitus with diabetic chronic kidney disease: Secondary | ICD-10-CM

## 2019-06-26 DIAGNOSIS — I10 Essential (primary) hypertension: Secondary | ICD-10-CM

## 2019-06-26 DIAGNOSIS — N183 Chronic kidney disease, stage 3 unspecified: Secondary | ICD-10-CM

## 2019-06-26 NOTE — Patient Instructions (Signed)
Visit Information  Goals Addressed            This Visit's Progress   . Assist with enrollment in Chronic Care Coordination Program and complete Social Determinants of Health Screen       Current Barriers:  [name] Knowledge Barriers related to resources and support available to address needs related to Chronic Care Management and challenges surrounding Social Determinants of Health  Clinical Social Work Clinical Goal(s):   Over the next 20 days, the patient will understand the role of the CCM team and work with SW to complete SDOH (Social Determinants of Health) screen.  Interventions:  Outbound call placed to the patient to introduce the Chronic Care Management program  Patient Education provided regarding referral placed by [name], FNP  Obtained verbal consent for enrollment  Scheduled follow up call to the patient on 06/27/2019 to conduct SDOH screening call  Provided education about SW role and plan to address identified SDOH challenge(s)  Collaboration with CCM team to communicate patient's enrollment into the program  Patient Self Care Activities:   Patient currently unable to independently manage chronic conditions   Initial goal documentation        Ms. Furey was given information about Care Management services today including:  1. Care Management services include personalized support from designated clinical staff supervised by her physician, including individualized plan of care and coordination with other care providers 2. 24/7 contact phone numbers for assistance for urgent and routine care needs. 3. The patient may stop CCM services at any time (effective at the end of the month) by phone call to the office staff.  Patient agreed to services and verbal consent obtained.   The patient verbalized understanding of instructions provided today and declined a print copy of patient instruction materials.   Telephone follow up appointment with care management  team member scheduled for:06/27/2019  Daneen Schick, BSW, CDP Social Worker, Certified Dementia Practitioner [facility] [phone]

## 2019-06-26 NOTE — Chronic Care Management (AMB) (Signed)
Care Management   Initial Visit Note  06/26/2019 Name: VANILLA HEATHERINGTON MRN: [id number] DOB: 28-Nov-1955  Referred by: [name], FNP Reason for referral : No chief complaint on file.   MATAYAH REYBURN is a 63 y.o. year old female who is a primary care patient of [name], [place]. The care management team was consulted for assistance with chronic disease management and care coordination needs.   Review of patient status, including review of consultants reports, relevant laboratory and other test results, and collaboration with appropriate care team members and the patient's provider was performed as part of comprehensive patient evaluation and provision of chronic care management services.    I initiated and established the plan of care for [name] during one on one collaboration with my clinical care management colleague Daneen Schick BSW who is also engaged with this patient to address social work needs.   Outpatient Encounter Medications as of 06/26/2019  Medication Sig  . albuterol (PROVENTIL HFA;VENTOLIN HFA) 108 (90 Base) MCG/ACT inhaler Inhale 2 puffs into the lungs every 6 (six) hours as needed. For shortness of breath.  [name] atorvastatin (LIPITOR) 10 MG tablet Take 1 tablet (10 mg total) by mouth daily. (Patient not taking: Reported on 06/19/2019)  . baclofen (LIORESAL) 10 MG tablet Take 1 tablet (10 mg total) by mouth 3 (three) times daily as needed for muscle spasms. (Patient not taking: Reported on 06/19/2019)  . Blood Glucose Monitoring Suppl (ACCU-CHEK NANO SMARTVIEW) w/Device KIT 1 each by Does not apply route 3 (three) times daily. (Patient not taking: Reported on 06/19/2019)  . buprenorphine-naloxone (SUBOXONE) 2-0.5 mg SUBL SL tablet Place 1 tablet under the tongue daily.  [name] buPROPion (WELLBUTRIN XL) 300 MG 24 hr tablet Take 300 mg by mouth every morning.  [name] glucose blood test strip Use as instructed (Patient not taking: Reported on 06/19/2019)  . Insulin Detemir  (LEVEMIR FLEXTOUCH) 100 UNIT/ML Pen Inject 40 Units into the skin at bedtime.  . Insulin Pen Needle (PEN NEEDLES) 32G X 4 MM MISC 1 each by Does not apply route 3 (three) times daily.  [name] lisinopril (ZESTRIL) 10 MG tablet Take 1 tablet (10 mg total) by mouth daily.  . meloxicam (MOBIC) 15 MG tablet Take 1 tablet (15 mg total) by mouth daily.  . metFORMIN (GLUCOPHAGE) 500 MG tablet One qd with meals( dose reduced due to worsening creatinine)  . NOVOLOG FLEXPEN 100 UNIT/ML FlexPen ADMINISTER 18 UNITS UNDER THE SKIN THREE TIMES DAILY AS NEEDED FOR HIGH BLOOD SUGAR  . omeprazole (PRILOSEC) 40 MG capsule Take 1 capsule (40 mg total) by mouth daily before breakfast.  . ondansetron (ZOFRAN) 4 MG tablet Take 1 tablet (4 mg total) by mouth daily as needed for nausea or vomiting. From Ozempic (Patient not taking: Reported on 06/19/2019)  . Semaglutide,0.25 or 0.5MG/DOS, (OZEMPIC, 0.25 OR 0.5 MG/DOSE,) 2 MG/1.5ML SOPN Inject 0.5 mg into the skin once a week. (Patient not taking: Reported on 06/19/2019)  . spironolactone (ALDACTONE) 50 MG tablet Take 1 tablet (50 mg total) by mouth daily.  . traZODone (DESYREL) 100 MG tablet Take 100 mg by mouth at bedtime.   No facility-administered encounter medications on file as of 06/26/2019.      Goals Addressed    . Assist with Chronic Care Management and Care Coordination needs       Current Barriers:  [name] Knowledge Barriers related to resources and support available to address needs related to Chronic Care Management and Care Coordination needs  Case Manager Clinical Goal(s):  [name] Over the next 30 days, patient will work with the CCM team to address needs related to Chronic Disease Management and Care Coordination needs  Interventions:  . Collaborated with BSW and initiated plan of care to address needs related to Chronic Care Management and Care Coordination needs  Patient Self Care Activities:  . Attends all scheduled provider appointments . Calls pharmacy for  medication refills . Calls provider office for new concerns or questions  Initial goal documentation \        Telephone follow up appointment with care management team member scheduled for: 07/31/19  Barb Merino, RN, BSN, CCM Care Management Coordinator [place] [facility]  Direct Phone: [phone]

## 2019-06-26 NOTE — Chronic Care Management (AMB) (Signed)
  Care Management    06/26/2019 Name: [name] MRN: [id number] DOB: 1955-11-13  Referred by: [name], FNP Reason for referral : Care Coordination   Kara Kelley is a 63 y.o. year old female who is a primary care patient of [name], [place]. The care management team was consulted for assistance with chronic disease management and care coordination needs.   Review of patient status, including review of consultants reports, relevant laboratory and other test results, and collaboration with appropriate care team members and the patient's provider was performed as part of comprehensive patient evaluation and provision of care management services.     Goals Addressed            This Visit's Progress   . Assist with enrollment in Chronic Care Coordination Program and complete Social Determinants of Health Screen       Current Barriers:  [name] Knowledge Barriers related to resources and support available to address needs related to Chronic Care Management and challenges surrounding Social Determinants of Health  Clinical Social Work Clinical Goal(s):   Over the next 20 days, the patient will understand the role of the CCM team and work with SW to complete SDOH (Social Determinants of Health) screen.  Interventions:  Outbound call placed to the patient to introduce the Chronic Care Management program  Patient Education provided regarding referral placed by [name], FNP  Obtained verbal consent for enrollment  Scheduled follow up call to the patient on 06/27/2019 to conduct SDOH screening call  Provided education about SW role and plan to address identified SDOH challenge(s)  Collaboration with CCM team to communicate patient's enrollment into the program  Patient Self Care Activities:   Patient currently unable to independently manage chronic conditions   Initial goal documentation          [name] was given information about Care Management services  today including:  1. Care Management services includes personalized support from designated clinical staff supervised by her physician, including individualized plan of care and coordination with other care providers 2. 24/7 contact phone numbers for assistance for urgent and routine care needs. 3. The patient may stop case management services at any time by phone call to the office staff.  Patient agreed to services and verbal consent obtained.    Follow up plan: Telephone follow up appointment with care management team member scheduled for:06/27/2019  Daneen Schick, BSW, CDP Social Worker, Certified Dementia Practitioner [place] [facility] [place] [facility] [phone]

## 2019-06-27 ENCOUNTER — Ambulatory Visit: Payer: Self-pay

## 2019-06-27 DIAGNOSIS — I10 Essential (primary) hypertension: Secondary | ICD-10-CM

## 2019-06-27 DIAGNOSIS — E1122 Type 2 diabetes mellitus with diabetic chronic kidney disease: Secondary | ICD-10-CM

## 2019-06-27 NOTE — Chronic Care Management (AMB) (Signed)
Care Management   Social Work Follow Up Note  06/27/2019 Name: Kara Kelley MRN: [id number] DOB: [birth year]  Kara Kelley is a 63 y.o. year old female who is a primary care patient of [name], [place]. The CCM team was consulted for assistance with disease management.   Review of patient status, including review of consultants reports, other relevant assessments, and collaboration with appropriate care team members and the patient's provider was performed as part of comprehensive patient evaluation and provision of chronic care management services.    SDOH (Social Determinants of Health) screening performed today: Food Insecurity . See Care Plan for related entries.   Advanced Directives Status: None at this time. See Care Plan for related entries.   Outpatient Encounter Medications as of 06/27/2019  Medication Sig  . albuterol (PROVENTIL HFA;VENTOLIN HFA) 108 (90 Base) MCG/ACT inhaler Inhale 2 puffs into the lungs every 6 (six) hours as needed. For shortness of breath.  [name] atorvastatin (LIPITOR) 10 MG tablet Take 1 tablet (10 mg total) by mouth daily. (Patient not taking: Reported on 06/19/2019)  . baclofen (LIORESAL) 10 MG tablet Take 1 tablet (10 mg total) by mouth 3 (three) times daily as needed for muscle spasms. (Patient not taking: Reported on 06/19/2019)  . Blood Glucose Monitoring Suppl (ACCU-CHEK NANO SMARTVIEW) w/Device KIT 1 each by Does not apply route 3 (three) times daily. (Patient not taking: Reported on 06/19/2019)  . buprenorphine-naloxone (SUBOXONE) 2-0.5 mg SUBL SL tablet Place 1 tablet under the tongue daily.  [name] buPROPion (WELLBUTRIN XL) 300 MG 24 hr tablet Take 300 mg by mouth every morning.  [name] glucose blood test strip Use as instructed (Patient not taking: Reported on 06/19/2019)  . Insulin Detemir (LEVEMIR FLEXTOUCH) 100 UNIT/ML Pen Inject 40 Units into the skin at bedtime.  . Insulin Pen Needle (PEN NEEDLES) 32G X 4 MM MISC 1 each by Does not apply route 3 (three)  times daily.  [name] lisinopril (ZESTRIL) 10 MG tablet Take 1 tablet (10 mg total) by mouth daily.  . meloxicam (MOBIC) 15 MG tablet Take 1 tablet (15 mg total) by mouth daily.  . metFORMIN (GLUCOPHAGE) 500 MG tablet One qd with meals( dose reduced due to worsening creatinine)  . NOVOLOG FLEXPEN 100 UNIT/ML FlexPen ADMINISTER 18 UNITS UNDER THE SKIN THREE TIMES DAILY AS NEEDED FOR HIGH BLOOD SUGAR  . omeprazole (PRILOSEC) 40 MG capsule Take 1 capsule (40 mg total) by mouth daily before breakfast.  . ondansetron (ZOFRAN) 4 MG tablet Take 1 tablet (4 mg total) by mouth daily as needed for nausea or vomiting. From Ozempic (Patient not taking: Reported on 06/19/2019)  . Semaglutide,0.25 or 0.5MG/DOS, (OZEMPIC, 0.25 OR 0.5 MG/DOSE,) 2 MG/1.5ML SOPN Inject 0.5 mg into the skin once a week. (Patient not taking: Reported on 06/19/2019)  . spironolactone (ALDACTONE) 50 MG tablet Take 1 tablet (50 mg total) by mouth daily.  . traZODone (DESYREL) 100 MG tablet Take 100 mg by mouth at bedtime.   No facility-administered encounter medications on file as of 06/27/2019.      Goals Addressed            This Visit's Progress     Patient Stated   . "I need to see a kidney specialist" (pt-stated)       Current Barriers:  . Limited knowledge of how to access specialist . Unable to verbalize plan for stage III CKD Diagnosis  Clinical Social Work Clinical Goal(s):  [name] Over the next 30 days the  patient will work with the CCM team to obtain a nephrology appointment.  Interventions: . Patient interviewed and appropriate assessments performed  . Determined that during a recent appointment with the patients primary care provider the patient was informed she would need to see a Nephrologist. . Performed chart review to confirm referral has been placed. Unable to locate referral within the patients EMR. . Collaboration with the patients primary provider via in-basket message to follow up on referral needs and inquire if  the referral would be placed electronically  . Advised the patient the CCM team would assist with obtaining a specialist appointment once a referral has been placed  Patient Self Care Activities:  . Attends all scheduled provider appointments . Calls pharmacy for medication refills . Performs ADL's independently  Initial goal documentation     . "I would like to learn more about Advance Directives" (pt-stated)       Current Barriers:  . Limited education about the importance of naming a healthcare power of attorney  Clinical Social Work Clinical Goal(s):  [name] Over the next 20 days, the patient will review mailed Advance Directive packet . Over the next 30 days, patient will verbalize basic understanding of Advanced Directives and importance of completion  Interventions: . Interviewed patient about Financial controller and provided education about the importance of completing advanced directives . Mailed the patient an Emergency planning/management officer . Advised patient to review information mailed by this SW . Scheduled follow up call to the patient over the next four weeks to assist with continued education and completion of packet if desired  Patient Self Care Activities:  . Attends all scheduled provider appointments . Calls provider office for new concerns or questions  Initial goal documentation        Other   . Assist with Chronic Case Management by assessing patients ability to independently manage chronic conditions       Current Barriers:  [name] Knowledge barriers related to the patients ability to manage Chronic Conditions including DM II, CKD III, and HTN  Clinical Social Work Clinical Goal(s):  [name] Over the next 30 days the patient will work with CCM RN Case Manager to establish an individualized care plan related to the management of patients identified chronic conditions.  Interventions: . Patient interviewed and appropriate assessments performed . Assessed for the patients  ability to verbalize current chronic medical conditions. The patient identifies current conditions as Diabetes, Kidney Disease, and Hypertension . Determined the patient has difficulty managing her blood glucose levels due to current diet and reports normal ranges of 110-120 but spikes of up to 200 . Informed by the patient she is prescribed Metformin as well as insulin. No cost concern identified . Assessed for patients knowledge of how to successfully manage Stage 3 Kidney Disease. [name] Determined the patient is aware she is in stage III and reports "I need to see a kidney specialist" The patient is unable to verbalize basic understanding of what Stage III Kidney Disease is or how it relates to her health other than saying "it can get worse if I don't get my diabetes under control" . Assessed for patients management of Hypertension . Determined the patient possesses a wrist blood pressure cuff but does not track her readings on a regular basis "I only check it when I feel like I need to but it was high at the doctor" . Confirmed the patient is prescribed blood pressure medication and does fill on a regular basis . Educated the  patient on the role of the Chronic Care Management RN Case Manager and her ability to establish an individualized care plan to assist the patient with the management of her health conditions . Scheduled an initial assessment with RN Case Manager for September 29th . Advised the patient to gather her medications and have them ready to review during this outreach call.   Patient Self Care Activities:  . Currently UNABLE TO independently manage chronic conditions as evidenced by patients self reported concerns with blood sugar readings and uncontrolled blood pressure.  Initial goal documentation     . COMPLETED: Assist with enrollment in Chronic Care Coordination Program and complete Social Determinants of Health Screen       Current Barriers:  [name] Knowledge Barriers related to  resources and support available to address needs related to Chronic Care Management and challenges surrounding Social Determinants of Health  Clinical Social Work Clinical Goal(s):   Over the next 20 days, the patient will understand the role of the CCM team and work with SW to complete SDOH (Social Determinants of Health) screen.  Interventions: Completed 06/27/2019  Outbound call placed to the patient to perform Social Determinants of Health screening  Determined the patient has difficulty affording food each month and often runs out  Assessed for current food insecurity resources: the patient reports she has applied for assistance through the Food and Nutrition Services program and currently receives $17 per month in benefits. Unfortunately, the patient still runs out of food often and reports relying on friends to supplement as well as occasionally accessing the [employer] for support  Determined the patient is not interested in receiving additional information on alternate food pantry locations as she already has resources  Encouraged the patient to contact SW if additional resources are desired at a later time  Patient Self Care Activities:   Patient currently unable to independently manage chronic conditions   Please see past updates          Follow Up Plan: SW will contact the patient over the next month to assist with Advance Directive completion.   Daneen Schick, BSW, CDP Social Worker, Certified Dementia Practitioner [place] [facility] [place] [facility] [phone]

## 2019-06-27 NOTE — Patient Instructions (Signed)
Visit Information  Goals Addressed            This Visit's Progress     Patient Stated   . "I need to see a kidney specialist" (pt-stated)       Current Barriers:  . Limited knowledge of how to access specialist . Unable to verbalize plan for stage III CKD Diagnosis  Clinical Social Work Clinical Goal(s):  [name]. Over the next 30 days the patient will work with the CCM team to obtain a nephrology appointment.  Interventions: . Patient interviewed and appropriate assessments performed  . Determined that during a recent appointment with the patients primary care provider the patient was informed she would need to see a Nephrologist. . Performed chart review to confirm referral has been placed. Unable to locate referral within the patients EMR. . Collaboration with the patients primary provider via in-basket message to follow up on referral needs and inquire if the referral would be placed electronically  . Advised the patient the CCM team would assist with obtaining a specialist appointment once a referral has been placed  Patient Self Care Activities:  . Attends all scheduled provider appointments . Calls pharmacy for medication refills . Performs ADL's independently  Initial goal documentation     . "I would like to learn more about Advance Directives" (pt-stated)       Current Barriers:  . Limited education about the importance of naming a healthcare power of attorney  Clinical Social Work Clinical Goal(s):  [name]. Over the next 20 days, the patient will review mailed Advance Directive packet . Over the next 30 days, patient will verbalize basic understanding of Advanced Directives and importance of completion  Interventions: . Interviewed patient about Designer, industrial/productAdvanced Directives and provided education about the importance of completing advanced directives . Mailed the patient an Engineer, productionAdvance Directive packet . Advised patient to review information mailed by this SW . Scheduled follow up call  to the patient over the next four weeks to assist with continued education and completion of packet if desired  Patient Self Care Activities:  . Attends all scheduled provider appointments . Calls provider office for new concerns or questions  Initial goal documentation        Other   . Assist with Chronic Case Management by assessing patients ability to independently manage chronic conditions       Current Barriers:  [name]. Knowledge barriers related to the patients ability to manage Chronic Conditions including DM II, CKD III, and HTN  Clinical Social Work Clinical Goal(s):  [name]. Over the next 30 days the patient will work with CCM RN Case Manager to establish an individualized care plan related to the management of patients identified chronic conditions.  Interventions: . Patient interviewed and appropriate assessments performed . Assessed for the patients ability to verbalize current chronic medical conditions. The patient identifies current conditions as Diabetes, Kidney Disease, and Hypertension . Determined the patient has difficulty managing her blood glucose levels due to current diet and reports normal ranges of 110-120 but spikes of up to 200 . Informed by the patient she is prescribed Metformin as well as insulin. No cost concern identified . Assessed for patients knowledge of how to successfully manage Stage 3 Kidney Disease. [name]. Determined the patient is aware she is in stage III and reports "I need to see a kidney specialist" The patient is unable to verbalize basic understanding of what Stage III Kidney Disease is or how it relates to her health other than saying "  it can get worse if I don't get my diabetes under control" . Assessed for patients management of Hypertension . Determined the patient possesses a wrist blood pressure cuff but does not track her readings on a regular basis "I only check it when I feel like I need to but it was high at the doctor" . Confirmed the patient  is prescribed blood pressure medication and does fill on a regular basis . Educated the patient on the role of the Chronic Care Management RN Case Manager and her ability to establish an individualized care plan to assist the patient with the management of her health conditions . Scheduled an initial assessment with RN Case Manager for September 29th . Advised the patient to gather her medications and have them ready to review during this outreach call.   Patient Self Care Activities:  . Currently UNABLE TO independently manage chronic conditions as evidenced by patients self reported concerns with blood sugar readings and uncontrolled blood pressure.  Initial goal documentation     . COMPLETED: Assist with enrollment in Chronic Care Coordination Program and complete Social Determinants of Health Screen       Current Barriers:  [name] Knowledge Barriers related to resources and support available to address needs related to Chronic Care Management and challenges surrounding Social Determinants of Health  Clinical Social Work Clinical Goal(s):   Over the next 20 days, the patient will understand the role of the CCM team and work with SW to complete SDOH (Social Determinants of Health) screen.  Interventions: Completed 06/27/2019  Outbound call placed to the patient to perform Social Determinants of Health screening  Determined the patient has difficulty affording food each month and often runs out  Assessed for current food insecurity resources: the patient reports she has applied for assistance through the Food and Nutrition Services program and currently receives $17 per month in benefits. Unfortunately, the patient still runs out of food often and reports relying on friends to supplement as well as occasionally accessing the [employer] for support  Determined the patient is not interested in receiving additional information on alternate food pantry locations as she already has  resources  Encouraged the patient to contact SW if additional resources are desired at a later time  Patient Self Care Activities:   Patient currently unable to independently manage chronic conditions   Please see past updates         The patient verbalized understanding of instructions provided today and declined a print copy of patient instruction materials.   SW will follow up with the patient over the next month to assist with Advance Directive completion.  Daneen Schick, BSW, CDP Social Worker, Certified Dementia Practitioner [place] [facility] [place] [facility] [phone]

## 2019-07-01 ENCOUNTER — Other Ambulatory Visit: Payer: Self-pay

## 2019-07-01 DIAGNOSIS — K219 Gastro-esophageal reflux disease without esophagitis: Secondary | ICD-10-CM

## 2019-07-01 DIAGNOSIS — I10 Essential (primary) hypertension: Secondary | ICD-10-CM

## 2019-07-01 DIAGNOSIS — E11649 Type 2 diabetes mellitus with hypoglycemia without coma: Secondary | ICD-10-CM

## 2019-07-01 DIAGNOSIS — Z794 Long term (current) use of insulin: Secondary | ICD-10-CM

## 2019-07-01 MED ORDER — SPIRONOLACTONE 50 MG PO TABS: 50.0000 mg | ORAL_TABLET | Freq: Every day | ORAL | 0 refills | Status: DC

## 2019-07-01 MED ORDER — OMEPRAZOLE 40 MG PO CPDR: 40.0000 mg | DELAYED_RELEASE_CAPSULE | Freq: Every day | ORAL | 0 refills | Status: DC

## 2019-07-01 MED ORDER — LISINOPRIL 10 MG PO TABS: 10.0000 mg | ORAL_TABLET | Freq: Every day | ORAL | 0 refills | Status: DC

## 2019-07-08 ENCOUNTER — Telehealth: Payer: Self-pay

## 2019-07-08 NOTE — Telephone Encounter (Signed)
Patient called requesting a copy of her med list.   I returned her call and advised her the copy will be up front for her. YRL,RMA

## 2019-07-09 ENCOUNTER — Encounter: Payer: Self-pay | Admitting: Nurse Practitioner

## 2019-07-09 ENCOUNTER — Ambulatory Visit: Payer: Self-pay

## 2019-07-09 ENCOUNTER — Ambulatory Visit: Payer: Medicaid Other | Admitting: Podiatry

## 2019-07-09 ENCOUNTER — Telehealth: Payer: Self-pay

## 2019-07-09 ENCOUNTER — Other Ambulatory Visit: Payer: Self-pay

## 2019-07-09 ENCOUNTER — Encounter: Payer: Self-pay | Admitting: Podiatry

## 2019-07-09 VITALS — BP 138/87 | HR 81

## 2019-07-09 DIAGNOSIS — T300 Burn of unspecified body region, unspecified degree: Secondary | ICD-10-CM | POA: Insufficient documentation

## 2019-07-09 DIAGNOSIS — Q6692 Congenital deformity of feet, unspecified, left foot: Secondary | ICD-10-CM

## 2019-07-09 DIAGNOSIS — N183 Type 2 diabetes mellitus with diabetic chronic kidney disease: Secondary | ICD-10-CM

## 2019-07-09 DIAGNOSIS — E1165 Type 2 diabetes mellitus with hyperglycemia: Secondary | ICD-10-CM | POA: Diagnosis not present

## 2019-07-09 DIAGNOSIS — Q828 Other specified congenital malformations of skin: Secondary | ICD-10-CM

## 2019-07-09 DIAGNOSIS — E1122 Type 2 diabetes mellitus with diabetic chronic kidney disease: Secondary | ICD-10-CM

## 2019-07-09 DIAGNOSIS — I10 Essential (primary) hypertension: Secondary | ICD-10-CM

## 2019-07-09 NOTE — Chronic Care Management (AMB) (Signed)
  Chronic Care Management   Outreach Note  07/09/2019 Name: Kara Kelley MRN: [id number] DOB: September 12, 1956  Referred by: [name], FNP Reason for referral : Care Coordination (INITIAL CC RNCM Telephone Outreach )   I attempted to contact [name] by telephone today to assess for CC RNCM needs, unfortunately [name] was unavailable to speak with me but is agreeable to a call back tomorrow. The patient was referred to the case management team by for assistance with care management and care coordination.   Follow Up Plan: Telephone follow up appointment with care management team member scheduled for: 07/10/19   Barb Merino, RN, BSN, CCM Care Management Coordinator [place] [facility]  Direct Phone: [phone]

## 2019-07-09 NOTE — Progress Notes (Signed)
This patient presents to the office for her painful callus both feet.  She has had callus for years and was treated by Dr.  Caffie Pinto.  She has not been treated for her callus for years.  She says she applied medicated drops to her painful callus.  She says her skin is now peeling off the outside ball of left foot.  She is diabetic.   She presents for evaluation and treatment of her diabetic feet.  Vascular  Dorsalis pedis and posterior tibial pulses are palpable  B/L.  Capillary return  WNL.  Temperature gradient is  WNL.  Skin turgor  WNL  Sensorium  Senn Weinstein monofilament wire  WNL. Normal tactile sensation.  Nail Exam  Patient has normal nails with no evidence of bacterial or fungal infection.  Orthopedic  Exam  Muscle tone and muscle strength  WNL.  No limitations of motion feet  B/L.  No crepitus or joint effusion noted.  Foot type is unremarkable and digits show no abnormalities.  Bony prominences are unremarkable.  Skin  No open lesions.  Normal skin texture and turgor. Pinch callus left foot.  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  Peeling necrotic skin noted sub 5th left.  Porokeratosis  B/L.  Skin burn.  IE.  Debride necrotic skin/callus  X 3 B/L.  RTC prn.   Gardiner Barefoot DPM

## 2019-07-10 ENCOUNTER — Telehealth: Payer: Self-pay

## 2019-07-11 NOTE — Progress Notes (Signed)
This encounter was created in error - please disregard.

## 2019-07-12 ENCOUNTER — Ambulatory Visit: Payer: Self-pay

## 2019-07-12 DIAGNOSIS — I10 Essential (primary) hypertension: Secondary | ICD-10-CM

## 2019-07-12 DIAGNOSIS — E11649 Type 2 diabetes mellitus with hypoglycemia without coma: Secondary | ICD-10-CM

## 2019-07-16 NOTE — Chronic Care Management (AMB) (Signed)
Care Management   Initial Visit Note  07/15/2019 Name: Kara Kelley MRN: [id number] DOB: [birth year]  Referred by: Kara Brine, FNP Reason for referral : Care Coordination (#2 CC RNCM Telephone Outreach)   Kara Kelley is a 63 y.o. year old female who is a primary care patient of Kara Kelley, [place]. The CCM team was consulted for assistance with chronic disease management and care coordination needs related to DMII CKD Stage III Smoking Cessation   Review of patient status, including review of consultants reports, relevant laboratory and other test results, and collaboration with appropriate care team members and the patient's provider was performed as part of comprehensive patient evaluation and provision of chronic care management services.    SDOH (Social Determinants of Health) screening performed today: Tobacco Use. See Care Plan for related entries.   Advanced Directives Status: N See Care Plan and Vynca application for related entries.   I spoke with Kara Kelley by telephone today to assess for CCM RN CM needs and a care plan was established.   Medications: Outpatient Encounter Medications as of 07/12/2019  Medication Sig  . Insulin Detemir (LEVEMIR FLEXTOUCH) 100 UNIT/ML Pen Inject 40 Units into the skin at bedtime. (Patient taking differently: Inject 35 Units into the skin at bedtime. )  . metFORMIN (GLUCOPHAGE) 500 MG tablet One qd with meals( dose reduced due to worsening creatinine)  . NOVOLOG FLEXPEN 100 UNIT/ML FlexPen ADMINISTER 18 UNITS UNDER THE SKIN THREE TIMES DAILY AS NEEDED FOR HIGH BLOOD SUGAR  . albuterol (PROVENTIL HFA;VENTOLIN HFA) 108 (90 Base) MCG/ACT inhaler Inhale 2 puffs into the lungs every 6 (six) hours as needed. For shortness of breath.  [name] atorvastatin (LIPITOR) 10 MG tablet Take 1 tablet (10 mg total) by mouth daily.  . baclofen (LIORESAL) 10 MG tablet Take 1 tablet (10 mg total) by mouth 3 (three) times daily as needed for muscle spasms.  .  Blood Glucose Monitoring Suppl (ACCU-CHEK NANO SMARTVIEW) w/Device KIT 1 each by Does not apply route 3 (three) times daily.  [name] buPROPion (WELLBUTRIN XL) 300 MG 24 hr tablet Take 300 mg by mouth every morning.  [name] glucose blood test strip Use as instructed  . Insulin Pen Needle (PEN NEEDLES) 32G X 4 MM MISC 1 each by Does not apply route 3 (three) times daily.  [name] lisinopril (ZESTRIL) 10 MG tablet Take 1 tablet (10 mg total) by mouth daily.  . meloxicam (MOBIC) 15 MG tablet Take 1 tablet (15 mg total) by mouth daily.  [name] omeprazole (PRILOSEC) 40 MG capsule Take 1 capsule (40 mg total) by mouth daily before breakfast.  . ondansetron (ZOFRAN) 4 MG tablet Take 1 tablet (4 mg total) by mouth daily as needed for nausea or vomiting. From Ozempic  . Semaglutide,0.25 or 0.5MG/DOS, (OZEMPIC, 0.25 OR 0.5 MG/DOSE,) 2 MG/1.5ML SOPN Inject 0.5 mg into the skin once a week.  . spironolactone (ALDACTONE) 50 MG tablet Take 1 tablet (50 mg total) by mouth daily.  . traZODone (DESYREL) 100 MG tablet Take 100 mg by mouth at bedtime.   No facility-administered encounter medications on file as of 07/12/2019.      Objective:  Lab Results  Component Value Date   HGBA1C 6.9 (H) 06/19/2019   HGBA1C 6.1 (H) 03/07/2019   HGBA1C 7.0 (H) 12/13/2018   Lab Results  Component Value Date   MICROALBUR 80 06/19/2019   LDLCALC 42 03/07/2019   CREATININE 1.07 (H) 06/19/2019   BP Readings from Last 3 Encounters:  07/09/19  138/87  06/19/19 (!) 160/90  03/06/19 137/81    Goals Addressed      Patient Stated   . "I need to learn how to eat better" (pt-stated)       Current Barriers:  [name] Knowledge Deficits related to diabetes disease process and Self Health Mangement  . A1C 6.9  Nurse Case Manager Clinical Goal(s):  [name] Over the next 60 days, patient will work with the CCM team to address needs related to diabetes disease education and improved Self Health management  . Over the next 90 days, patient will lower her A1C  <6.9  CCM RN CM Interventions:  07/15/19 call completed with patient   . Evaluation of current treatment plan related to diabetes and patient's adherence to plan as established by provider. . Provided education to patient re: patient's most recent A1C of 6.9 obtained on 06/19/19; discussed target A1C <5.6; education provided related to dietary recommendations including Meal Planning with using the Plate Method and lowering intake of carbohydrates; discussed the importance of implementing a routine exercise regimen; discussed ADA recommendations of 150 minutes per week; discussed importance of taking medications exactly as prescribed; discussed her recent blood glucose was noted to be 62; patient admits to not eating breakfast prior to labs; she routinely does not eat breakfast on a regular basis; patient is not checking her CBG's routinely, she does feel comfortable with using her glucometer and will start monitoring daily before meals and will log her readings  . Reviewed medications with patient and discussed indication, dosage and frequency of her prescribed diabetic medications; discussed embedded Pharm D Kara Kelley will review her current regimen and will discuss recommendations for any changes needed to get the best pharmacological management of her DM . Collaborated with embedded Pharm D Kara Kelley regarding outreach needed to patient to review her current pharmacological treatment regimen . Discussed plans with patient for ongoing care management follow up and provided patient with direct contact information for care management team . Provided patient with printed educational materials related to Diabetes disease management; Meal Planning using the Plate Method; DM Zone Safety Tool; Carb Counting . Advised patient, providing education and rationale, to check cbg daily before meals and record, calling the CCM team and or PCP provider for findings outside established parameters.  <80 and or >250   Patient Self Care Activities:  . Self administers medications as prescribed . Attends all scheduled provider appointments . Calls pharmacy for medication refills . Attends church or other social activities . Performs ADL's independently . Performs IADL's independently . Calls provider office for new concerns or questions  Initial goal documentation     . "I need to see a kidney specialist" (pt-stated)       Current Barriers:  . Limited knowledge of how to access specialist . Unable to verbalize plan for stage III CKD Diagnosis  Clinical Social Work Clinical Goal(s):  [name] Over the next 30 days the patient will work with the CCM team to obtain a nephrology appointment.  CCM RN CM Interventions: 07/15/19 call completed with patient  . Evaluation of current treatment plan related to stage III CKD and patient's adherence to plan as established by provider. . Provided education to patient re: basic disease process and potential cause for kidney decline/disease; discussed potential complications for CKD and best ways to manage this condition; discussed patient's most recent BMP obtained on 06/19/19 have slightly improved; discussed PCP provider Kara Brine, FNP will continue to manage this condition but will  consider referring to a Nephrologist if CKD progresses  . Reviewed medications with patient and discussed some medications including Metformin can effect renal function; discussed patient has a good understanding about the recent Metformin dosage decrease to 500 mg once daily due to elevated creatinine . Discussed plans with patient for ongoing care management follow up and provided patient with direct contact information for care management team . Provided patient with printed educational materials related to CKD  Patient Self Care Activities:  . Attends all scheduled provider appointments . Calls pharmacy for medication refills . Performs ADL's independently  Please see past updates  related to this goal by clicking on the "Past Updates" button in the selected goal      . "I would like to quit smoking" (pt-stated)       Current Barriers:  [name] Knowledge Deficits related to treatment management and resources related to how to quit smoking tobacco products  Nurse Case Manager Clinical Goal(s):  [name] Over the next 60 days, patient will verbalize understanding of plan for smoking cessation   CCM RN CM Interventions:  07/15/19 completed call with patient   . Evaluation of current treatment plan related to smoking cessation and patient's adherence to plan as established by provider. Nash Dimmer with embedded Pharm D Kara Kelley and PCP provider Kara Brine, FNP regarding pharmacological recommendations for smoking cessation  . Discussed plans with patient for ongoing care management follow up and provided patient with direct contact information for care management team . Provided patient with printed educational materials related to smoking cessation   Patient Self Care Activities:  . Self administers medications as prescribed . Attends all scheduled provider appointments . Calls pharmacy for medication refills . Attends church or other social activities . Performs ADL's independently . Performs IADL's independently . Calls provider office for new concerns or questions  Initial goal documentation       Other   . COMPLETED: Assist with Chronic Care Management and Care Coordination needs       Current Barriers:  [name] Knowledge Barriers related to resources and support available to address needs related to Chronic Care Management and Care Coordination needs  Case Manager Clinical Goal(s):  [name] Over the next 30 days, patient will work with the CCM team to address needs related to Chronic Disease Management and Care Coordination needs  CCM RN CM Interventions:  07/15/19 call completed with patient  . Care Plan established with patient - see other goals  Patient Self  Care Activities:  . Attends all scheduled provider appointments . Calls pharmacy for medication refills . Calls provider office for new concerns or questions  Initial goal documentation \        Plan:   Telephone follow up appointment with care management team member scheduled for: 08/14/19  Barb Merino, RN, BSN, CCM Care Management Coordinator [place] [facility]  Direct Phone: [phone]

## 2019-07-16 NOTE — Patient Instructions (Signed)
Visit Information  Goals Addressed      Patient Stated   . "I need to learn how to eat better" (pt-stated)       Current Barriers:  [name] Knowledge Deficits related to diabetes disease process and Self Health Mangement  . A1C 6.9  Nurse Case Manager Clinical Goal(s):  [name] Over the next 60 days, patient will work with the CCM team to address needs related to diabetes disease education and improved Self Health management  . Over the next 90 days, patient will lower her A1C <6.9  CCM RN CM Interventions:  07/15/19 call completed with patient   . Evaluation of current treatment plan related to diabetes and patient's adherence to plan as established by provider. . Provided education to patient re: patient's most recent A1C of 6.9 obtained on 06/19/19; discussed target A1C <5.6; education provided related to dietary recommendations including Meal Planning with using the Plate Method and lowering intake of carbohydrates; discussed the importance of implementing a routine exercise regimen; discussed ADA recommendations of 150 minutes per week; discussed importance of taking medications exactly as prescribed; discussed her recent blood glucose was noted to be 62; patient admits to not eating breakfast prior to labs; she routinely does not eat breakfast on a regular basis; patient is not checking her CBG's routinely, she does feel comfortable with using her glucometer and will start monitoring daily before meals and will log her readings  . Reviewed medications with patient and discussed indication, dosage and frequency of her prescribed diabetic medications; discussed embedded Pharm D Vanice Sarah will review her current regimen and will discuss recommendations for any changes needed to get the best pharmacological management of her DM . Collaborated with embedded Pharm D Vanice Sarah regarding outreach needed to patient to review her current pharmacological treatment regimen . Discussed plans with patient for  ongoing care management follow up and provided patient with direct contact information for care management team . Provided patient with printed educational materials related to Diabetes disease management; Meal Planning using the Plate Method; DM Zone Safety Tool; Carb Counting . Advised patient, providing education and rationale, to check cbg daily before meals and record, calling the CCM team and or PCP provider for findings outside established parameters.  <80 and or >250  Patient Self Care Activities:  . Self administers medications as prescribed . Attends all scheduled provider appointments . Calls pharmacy for medication refills . Attends church or other social activities . Performs ADL's independently . Performs IADL's independently . Calls provider office for new concerns or questions  Initial goal documentation     . "I need to see a kidney specialist" (pt-stated)       Current Barriers:  . Limited knowledge of how to access specialist . Unable to verbalize plan for stage III CKD Diagnosis  Clinical Social Work Clinical Goal(s):  [name] Over the next 30 days the patient will work with the CCM team to obtain a nephrology appointment.  CCM RN CM Interventions: 07/15/19 call completed with patient  . Evaluation of current treatment plan related to stage III CKD and patient's adherence to plan as established by provider. . Provided education to patient re: basic disease process and potential cause for kidney decline/disease; discussed potential complications for CKD and best ways to manage this condition; discussed patient's most recent BMP obtained on 06/19/19 have slightly improved; discussed PCP provider Arnette Felts, FNP will continue to manage this condition but will consider referring to a Nephrologist if CKD progresses  .  Reviewed medications with patient and discussed some medications including Metformin can effect renal function; discussed patient has a good understanding about  the recent Metformin dosage decrease to 500 mg once daily due to elevated creatinine . Discussed plans with patient for ongoing care management follow up and provided patient with direct contact information for care management team . Provided patient with printed educational materials related to CKD  Patient Self Care Activities:  . Attends all scheduled provider appointments . Calls pharmacy for medication refills . Performs ADL's independently  Please see past updates related to this goal by clicking on the "Past Updates" button in the selected goal      . "I would like to quit smoking" (pt-stated)       Current Barriers:  [name] Knowledge Deficits related to treatment management and resources related to how to quit smoking tobacco products  Nurse Case Manager Clinical Goal(s):  [name] Over the next 60 days, patient will verbalize understanding of plan for smoking cessation   CCM RN CM Interventions:  07/15/19 completed call with patient   . Evaluation of current treatment plan related to smoking cessation and patient's adherence to plan as established by provider. Nash Dimmer with embedded Pharm D Lottie Dawson and PCP provider [name], FNP regarding pharmacological recommendations for smoking cessation  . Discussed plans with patient for ongoing care management follow up and provided patient with direct contact information for care management team . Provided patient with printed educational materials related to smoking cessation   Patient Self Care Activities:  . Self administers medications as prescribed . Attends all scheduled provider appointments . Calls pharmacy for medication refills . Attends church or other social activities . Performs ADL's independently . Performs IADL's independently . Calls provider office for new concerns or questions  Initial goal documentation       Other   . COMPLETED: Assist with Chronic Care Management and Care Coordination needs        Current Barriers:  [name] Knowledge Barriers related to resources and support available to address needs related to Chronic Care Management and Care Coordination needs  Case Manager Clinical Goal(s):  [name] Over the next 30 days, patient will work with the CCM team to address needs related to Chronic Disease Management and Care Coordination needs  CCM RN CM Interventions:  07/15/19 call completed with patient  . Care Plan established with patient - see other goals  Patient Self Care Activities:  . Attends all scheduled provider appointments . Calls pharmacy for medication refills . Calls provider office for new concerns or questions  Initial goal documentation \       The patient verbalized understanding of instructions provided today and declined a print copy of patient instruction materials.   Telephone follow up appointment with care management team member scheduled for: 08/14/19  Barb Merino, RN, BSN, CCM Care Management Coordinator [place] [facility]  Direct Phone: [phone]

## 2019-07-17 ENCOUNTER — Ambulatory Visit: Payer: Self-pay

## 2019-07-17 DIAGNOSIS — Z794 Long term (current) use of insulin: Secondary | ICD-10-CM

## 2019-07-17 DIAGNOSIS — E11649 Type 2 diabetes mellitus with hypoglycemia without coma: Secondary | ICD-10-CM

## 2019-07-17 NOTE — Chronic Care Management (AMB) (Signed)
  Chronic Care Management   Social Work Note  07/17/2019 Name: Kara Kelley MRN: [id number] DOB: [birth year]  Outbound call to confirm receipt of advance directive packet. The patient denies receipt. SW will reschedule appointment and follow up with the patient in the next two weeks to review information.   During today's call the patient reports plans to visit urgent care to address an earache she has experienced for several days. The patient denies contact with primary provider office to establish an appointment. SW attempted to schedule an appointment for the patient to be seen in the practice, the patient declined stating "I already have an appointment at urgent care". SW will notify the patients provider of reported ear pain and plan.  Daneen Schick, BSW, CDP Social Worker, Certified Dementia Practitioner [place] [facility] [place] [facility] [phone]

## 2019-07-19 ENCOUNTER — Other Ambulatory Visit: Payer: Self-pay

## 2019-07-19 DIAGNOSIS — Z20822 Contact with and (suspected) exposure to covid-19: Secondary | ICD-10-CM

## 2019-07-21 LAB — NOVEL CORONAVIRUS, NAA: SARS-CoV-2, NAA: NOT DETECTED

## 2019-07-30 ENCOUNTER — Ambulatory Visit: Payer: Self-pay

## 2019-07-30 DIAGNOSIS — E11649 Type 2 diabetes mellitus with hypoglycemia without coma: Secondary | ICD-10-CM

## 2019-07-30 DIAGNOSIS — Z794 Long term (current) use of insulin: Secondary | ICD-10-CM

## 2019-07-30 DIAGNOSIS — I10 Essential (primary) hypertension: Secondary | ICD-10-CM

## 2019-07-30 NOTE — Chronic Care Management (AMB) (Signed)
  Care Management   Follow Up Note   07/30/2019 Name: Kara Kelley MRN: [id number] DOB: Mar 23, 1956  Referred by: [name], FNP Reason for referral : Care Coordination   Incoming call received from patient to confirm receipt of AD packet. Patient encouraged to contact SW with questions regarding document completion as needed. Patient stated understanding.   No follow up planned at this time by SW.  Daneen Schick, BSW, CDP Social Worker, Certified Dementia Practitioner [place] [facility] [place] [facility] [phone]

## 2019-07-30 NOTE — Chronic Care Management (AMB) (Signed)
Care Management   Follow Up Note   07/30/2019 Name: Kara Kelley MRN: [id number] DOB: 1956-10-06  Referred by: [name], FNP Reason for referral : Care Coordination   Kara Kelley is a 63 y.o. year old female who is a primary care patient of [name], [place]. The care management team was consulted for assistance with care management and care coordination needs.    Review of patient status, including review of consultants reports, relevant laboratory and other test results, and collaboration with appropriate care team members and the patient's provider was performed as part of comprehensive patient evaluation and provision of chronic care management services.    SDOH (Social Determinants of Health) screening performed today: None. See Care Plan for related entries.   Advanced Directives: N See Care Plan and Vynca application for related entries.   Goals Addressed            This Visit's Progress     Patient Stated   . COMPLETED: "I would like to learn more about Advance Directives" (pt-stated)       Current Barriers:  . Limited education about the importance of naming a healthcare power of attorney  Clinical Social Work Clinical Goal(s):  [name] Over the next 20 days, the patient will review mailed Advance Directive packet . Over the next 30 days, patient will verbalize basic understanding of Advanced Directives and importance of completion  Interventions: . Outbound call placed to the patient to confirm receipt of mailing . Patient denies receipt but states she has not been home for several days due to staying with her sister . SW attempted to obtain alternative mailing location to re-send materials - patient declined stating "I'll go over there today and check" . Encouraged patient to contact SW if materials were not received or if assistance with completion is desired . Provided education on process with notarizing documents once completed  Patient Self Care  Activities:  . Attends all scheduled provider appointments . Calls provider office for new concerns or questions  Please see past updates related to this goal by clicking on the "Past Updates" button in the selected goal         Other   . COMPLETED: Assist with Chronic Case Management by assessing patients ability to independently manage chronic conditions       Current Barriers:  [name] Knowledge barriers related to the patients ability to manage Chronic Conditions including DM II, CKD III, and HTN  Clinical Social Work Clinical Goal(s):  [name] Over the next 30 days the patient will work with CCM RN Case Manager to establish an individualized care plan related to the management of patients identified chronic conditions.  Interventions: . Patient interviewed and appropriate assessments performed . Assessed for the patients ability to verbalize current chronic medical conditions. The patient identifies current conditions as Diabetes, Kidney Disease, and Hypertension . Determined the patient has difficulty managing her blood glucose levels due to current diet and reports normal ranges of 110-120 but spikes of up to 200 . Informed by the patient she is prescribed Metformin as well as insulin. No cost concern identified . Assessed for patients knowledge of how to successfully manage Stage 3 Kidney Disease. [name] Determined the patient is aware she is in stage III and reports "I need to see a kidney specialist" The patient is unable to verbalize basic understanding of what Stage III Kidney Disease is or how it relates to her health other than saying "it can get worse if  I don't get my diabetes under control" . Assessed for patients management of Hypertension . Determined the patient possesses a wrist blood pressure cuff but does not track her readings on a regular basis "I only check it when I feel like I need to but it was high at the doctor" . Confirmed the patient is prescribed blood pressure medication  and does fill on a regular basis . Educated the patient on the role of the Chronic Care Management RN Case Manager and her ability to establish an individualized care plan to assist the patient with the management of her health conditions . Scheduled an initial assessment with RN Case Manager for September 29th . Advised the patient to gather her medications and have them ready to review during this outreach call.   Patient Self Care Activities:  . Currently UNABLE TO independently manage chronic conditions as evidenced by patients self reported concerns with blood sugar readings and uncontrolled blood pressure.  Initial goal documentation         No further SW follow up planned at this time. The patient will remain active with RN Case Manager,  Bevelyn Ngo, BSW, CDP Social Worker, Certified Dementia Practitioner [facility] Care Management [phone]

## 2019-07-30 NOTE — Patient Instructions (Signed)
Social Worker Visit Information  Goals we discussed today:  Goals Addressed            This Visit's Progress     Patient Stated   . COMPLETED: "I would like to learn more about Advance Directives" (pt-stated)       Current Barriers:  . Limited education about the importance of naming a healthcare power of attorney  Clinical Social Work Clinical Goal(s):  [name] Over the next 20 days, the patient will review mailed Advance Directive packet . Over the next 30 days, patient will verbalize basic understanding of Advanced Directives and importance of completion  Interventions: . Outbound call placed to the patient to confirm receipt of mailing . Patient denies receipt but states she has not been home for several days due to staying with her sister . SW attempted to obtain alternative mailing location to re-send materials - patient declined stating "I'll go over there today and check" . Encouraged patient to contact SW if materials were not received or if assistance with completion is desired . Provided education on process with notarizing documents once completed  Patient Self Care Activities:  . Attends all scheduled provider appointments . Calls provider office for new concerns or questions  Please see past updates related to this goal by clicking on the "Past Updates" button in the selected goal         Other   . COMPLETED: Assist with Chronic Case Management by assessing patients ability to independently manage chronic conditions       Current Barriers:  [name] Knowledge barriers related to the patients ability to manage Chronic Conditions including DM II, CKD III, and HTN  Clinical Social Work Clinical Goal(s):  [name] Over the next 30 days the patient will work with CCM RN Case Manager to establish an individualized care plan related to the management of patients identified chronic conditions.  Interventions: . Patient interviewed and appropriate assessments performed . Assessed for  the patients ability to verbalize current chronic medical conditions. The patient identifies current conditions as Diabetes, Kidney Disease, and Hypertension . Determined the patient has difficulty managing her blood glucose levels due to current diet and reports normal ranges of 110-120 but spikes of up to 200 . Informed by the patient she is prescribed Metformin as well as insulin. No cost concern identified . Assessed for patients knowledge of how to successfully manage Stage 3 Kidney Disease. [name] Determined the patient is aware she is in stage III and reports "I need to see a kidney specialist" The patient is unable to verbalize basic understanding of what Stage III Kidney Disease is or how it relates to her health other than saying "it can get worse if I don't get my diabetes under control" . Assessed for patients management of Hypertension . Determined the patient possesses a wrist blood pressure cuff but does not track her readings on a regular basis "I only check it when I feel like I need to but it was high at the doctor" . Confirmed the patient is prescribed blood pressure medication and does fill on a regular basis . Educated the patient on the role of the Chronic Care Management RN Case Manager and her ability to establish an individualized care plan to assist the patient with the management of her health conditions . Scheduled an initial assessment with RN Case Manager for September 29th . Advised the patient to gather her medications and have them ready to review during this outreach call.  Patient Self Care Activities:  . Currently UNABLE TO independently manage chronic conditions as evidenced by patients self reported concerns with blood sugar readings and uncontrolled blood pressure.  Initial goal documentation         Materials Provided: Verbal education about advance directive provided by phone  Follow Up Plan: No SW follow up planned at this time. Please call me with future  resource needs.   Daneen Schick, BSW, CDP Social Worker, Certified Dementia Practitioner [place] [facility] [place] Management [phone]

## 2019-07-31 ENCOUNTER — Telehealth: Payer: Self-pay

## 2019-08-08 ENCOUNTER — Telehealth: Payer: Self-pay

## 2019-08-08 ENCOUNTER — Ambulatory Visit: Payer: Self-pay

## 2019-08-08 DIAGNOSIS — Z794 Long term (current) use of insulin: Secondary | ICD-10-CM

## 2019-08-08 DIAGNOSIS — I10 Essential (primary) hypertension: Secondary | ICD-10-CM

## 2019-08-08 DIAGNOSIS — E11649 Type 2 diabetes mellitus with hypoglycemia without coma: Secondary | ICD-10-CM

## 2019-08-08 NOTE — Chronic Care Management (AMB) (Signed)
  Care Management   Follow Up Note   08/08/2019 Name: Kara Kelley MRN: [id number] DOB: [birth year]  Referred by: [name], FNP Reason for referral : Care Coordination   SW placed an unsuccessful outbound call to the patient in response to a recent voice message left requesting assistance with AD completion. SW left a HIPAA compliant voice message requesting a return call.   The care management team will reach out to the patient again over the next 10 days.   Daneen Schick, BSW, CDP Social Worker, Certified Dementia Practitioner [place] [facility] [place] Management [phone]

## 2019-08-13 ENCOUNTER — Telehealth: Payer: Self-pay

## 2019-08-14 ENCOUNTER — Ambulatory Visit (INDEPENDENT_AMBULATORY_CARE_PROVIDER_SITE_OTHER): Payer: Medicaid Other | Admitting: Podiatry

## 2019-08-14 ENCOUNTER — Telehealth: Payer: Self-pay

## 2019-08-14 ENCOUNTER — Other Ambulatory Visit: Payer: Self-pay

## 2019-08-14 ENCOUNTER — Encounter: Payer: Self-pay | Admitting: Podiatry

## 2019-08-14 DIAGNOSIS — Q828 Other specified congenital malformations of skin: Secondary | ICD-10-CM

## 2019-08-14 DIAGNOSIS — E1165 Type 2 diabetes mellitus with hyperglycemia: Secondary | ICD-10-CM

## 2019-08-14 DIAGNOSIS — M216X2 Other acquired deformities of left foot: Secondary | ICD-10-CM | POA: Diagnosis not present

## 2019-08-14 NOTE — Progress Notes (Signed)
This patient present to the office  with chief complaint of callus developing under the outside of ball of left  feet.  She says this callus has become painful walking and wearing her shoes. Patient has provided no  treatment or sought professional help.  She presents to the office for treatment of her painful callus.  Vascular  Dorsalis pedis and posterior tibial pulses are palpable  B/L.  Capillary return  WNL.  Temperature gradient is  WNL.  Skin turgor  WNL  Sensorium  Senn Weinstein monofilament wire  WNL. Normal tactile sensation.  Nail Exam  Patient has normal nails with no evidence of bacterial or fungal infection. S/P surgery probably symes procedure.  Orthopedic  Exam  Muscle tone and muscle strength  WNL.  No limitations of motion feet  B/L.  No crepitus or joint effusion noted.  Foot type is unremarkable and digits show no abnormalities.  Bony prominences are unremarkable.  Plantar flexed fifth metatarsal  Left foot.  Skin  No open lesions.  Normal skin texture and turgor.  Callus/porokeratosis  sub 5th  Left foot.  Callus left hallux.  Porokeratosis secondary plantar flexed fifth metatarsal  B/L  IE  Debride callus/porokeratosis.  Discussed condition with patient.  Patient desires a surgical consult for painful left foot. Padding applied.  Gardiner Barefoot DPM

## 2019-08-16 ENCOUNTER — Telehealth: Payer: Self-pay

## 2019-08-16 ENCOUNTER — Ambulatory Visit: Payer: Self-pay

## 2019-08-16 DIAGNOSIS — I10 Essential (primary) hypertension: Secondary | ICD-10-CM

## 2019-08-16 DIAGNOSIS — Z794 Long term (current) use of insulin: Secondary | ICD-10-CM

## 2019-08-16 DIAGNOSIS — E11649 Type 2 diabetes mellitus with hypoglycemia without coma: Secondary | ICD-10-CM

## 2019-08-16 NOTE — Chronic Care Management (AMB) (Signed)
   Care Management   Outreach Note  08/16/2019 Name: Kara Kelley MRN: [id number] DOB: 09/12/1956  Referred by: [name], FNP Reason for referral : Care Coordination   SW placed a second unsuccessful outbound call to the patient to assist with completion of advance directive packet. SW left a HIPAA compliant voice message requesting a return call.  Follow Up Plan: SW will follow up with the patient over the next two weeks.  Daneen Schick, BSW, CDP Social Worker, Certified Dementia Practitioner [place] [facility] [place] Management [phone]

## 2019-08-20 ENCOUNTER — Other Ambulatory Visit: Payer: Self-pay

## 2019-08-20 ENCOUNTER — Ambulatory Visit: Payer: Medicaid Other | Admitting: Podiatry

## 2019-08-20 ENCOUNTER — Ambulatory Visit (INDEPENDENT_AMBULATORY_CARE_PROVIDER_SITE_OTHER): Payer: Medicaid Other

## 2019-08-20 DIAGNOSIS — Q828 Other specified congenital malformations of skin: Secondary | ICD-10-CM

## 2019-08-20 DIAGNOSIS — M216X2 Other acquired deformities of left foot: Secondary | ICD-10-CM

## 2019-08-20 DIAGNOSIS — Z01818 Encounter for other preprocedural examination: Secondary | ICD-10-CM

## 2019-08-20 NOTE — Patient Instructions (Signed)
Pre-Operative Instructions  Congratulations, you have decided to take an important step towards improving your quality of life.  You can be assured that the doctors and staff at [facility] will be with you every step of the way.  Here are some important things you should know:  1. Plan to be at the surgery center/hospital at least 1 (one) hour prior to your scheduled time, unless otherwise directed by the surgical center/hospital staff.  You must have a responsible adult accompany you, remain during the surgery and drive you home.  Make sure you have directions to the surgical center/hospital to ensure you arrive on time. 2. If you are having surgery at [facility] or [facility], you will need a copy of your medical history and physical form from your family physician within one month prior to the date of surgery. We will give you a form for your primary physician to complete.  3. We make every effort to accommodate the date you request for surgery.  However, there are times where surgery dates or times have to be moved.  We will contact you as soon as possible if a change in schedule is required.   4. No aspirin/ibuprofen for one week before surgery.  If you are on aspirin, any non-steroidal anti-inflammatory medications (Mobic, Aleve, Ibuprofen) should not be taken seven (7) days prior to your surgery.  You make take Tylenol for pain prior to surgery.  5. Medications - If you are taking daily heart and blood pressure medications, seizure, reflux, allergy, asthma, anxiety, pain or diabetes medications, make sure you notify the surgery center/hospital before the day of surgery so they can tell you which medications you should take or avoid the day of surgery. 6. No food or drink after midnight the night before surgery unless directed otherwise by surgical center/hospital staff. 7. No alcoholic beverages 24-hours prior to surgery.  No smoking 24-hours prior or 24-hours after  surgery. 8. Wear loose pants or shorts. They should be loose enough to fit over bandages, boots, and casts. 9. Don't wear slip-on shoes. Sneakers are preferred. 10. Bring your boot with you to the surgery center/hospital.  Also bring crutches or a walker if your physician has prescribed it for you.  If you do not have this equipment, it will be provided for you after surgery. 11. If you have not been contacted by the surgery center/hospital by the day before your surgery, call to confirm the date and time of your surgery. 12. Leave-time from work may vary depending on the type of surgery you have.  Appropriate arrangements should be made prior to surgery with your employer. 13. Prescriptions will be provided immediately following surgery by your doctor.  Fill these as soon as possible after surgery and take the medication as directed. Pain medications will not be refilled on weekends and must be approved by the doctor. 14. Remove nail polish on the operative foot and avoid getting pedicures prior to surgery. 15. Wash the night before surgery.  The night before surgery wash the foot and leg well with water and the antibacterial soap provided. Be sure to pay special attention to beneath the toenails and in between the toes.  Wash for at least three (3) minutes. Rinse thoroughly with water and dry well with a towel.  Perform this wash unless told not to do so by your physician.  Enclosed: 1 Ice pack (please put in freezer the night before surgery)   1 Hibiclens skin cleaner     Pre-op instructions  If you have any questions regarding the instructions, please do not hesitate to call our office.  [place]: [address], , [place] [address] -- [phone]  [place]: [address] [place], [place] [address] -- [phone]  [place]: [address]  [place], [place] [address] -- [phone]   Website: [email or web address] 

## 2019-08-22 ENCOUNTER — Telehealth: Payer: Self-pay

## 2019-08-23 ENCOUNTER — Telehealth: Payer: Self-pay

## 2019-08-27 ENCOUNTER — Other Ambulatory Visit: Payer: Self-pay

## 2019-08-27 ENCOUNTER — Telehealth: Payer: Self-pay | Admitting: *Deleted

## 2019-08-27 MED ORDER — NOVOLOG FLEXPEN 100 UNIT/ML ~~LOC~~ SOPN: PEN_INJECTOR | SUBCUTANEOUS | 0 refills | Status: DC

## 2019-08-27 NOTE — Telephone Encounter (Addendum)
"  I want to know if Dr. Jacqualyn Posey wants me to have the blood work done before I schedule my surgery."  I am not sure.  I will ask him and give you a call back.

## 2019-08-27 NOTE — Progress Notes (Signed)
Subjective: 63 year old female presents the office today for surgical consultation.  She has had chronic pain to her left foot pointing to submetatarsal 5 she gets a thick callus to this area.  She tries to have the area trimmed and it comes right back causing discomfort.  She is tried routine debridements as well as shoe modifications and padding the insignificant improvement.  She wants to discuss surgical options.  She has previously had a bunionectomy as well as a tailor's bunionectomy on this foot.  Denies any systemic complaints such as fevers, chills, nausea, vomiting. No acute changes since last appointment, and no other complaints at this time.   Objective: AAO x3, NAD DP/PT pulses palpable bilaterally, CRT less than 3 seconds Hyperkeratotic lesion left foot submetatarsal 5.  There is prominent metatarsal head plantarly and plantar flexion of the fifth ray.  This is resulting in a painful lesion.  There is no other areas of tenderness identified. No open lesions or pre-ulcerative lesions.  No pain with calf compression, swelling, warmth, erythema  Assessment: Painful hyperkeratotic lesion with plantarflexed fifth ray  Plan: -All treatment options discussed with the patient including all alternatives, risks, complications.  -X-rays reviewed.  Hardware intact from prior surgery.  No evidence of acute fracture. -We discussed with conservative as well as surgical treatment options.  This time she wished to proceed with surgical intervention.  I discussed options for her including removal of the hardware and exostectomy, shaving of the plantar metatarsal head however she wants to proceed with metatarsal head excision.  We discussed risks of this. -We will plan for left foot fifth metatarsal head excision with hardware removal. -The incision placement as well as the postoperative course was discussed with the patient. I discussed risks of the surgery which include, but not limited to, infection,  bleeding, pain, swelling, need for further surgery, delayed or nonhealing, painful or ugly scar, numbness or sensation changes, over/under correction, recurrence, transfer lesions, further deformity, hardware failure, DVT/PE, loss of toe/foot. Patient understands these risks and wishes to proceed with surgery. The surgical consent was reviewed with the patient all 3 pages were signed. No promises or guarantees were given to the outcome of the procedure. All questions were answered to the best of my ability. Before the surgery the patient was encouraged to call the office if there is any further questions. The surgery will be performed at the [facility] on an outpatient basis. -Preoperative blood work ordered including A1c, CBC, CMP -Patient encouraged to call the office with any questions, concerns, change in symptoms.   Trula Slade DPM

## 2019-08-27 NOTE — Telephone Encounter (Signed)
I am calling you back.  Dr. Jacqualyn Posey wants you to do the bloodwork prior to your surgery date.  Do you want to go ahead and schedule your surgery date?  "I'll do the bloodwork test today.  Yes, let's go ahead and schedule it." Do you have a date that you would like?  "I'd like to do it after Christmas."  Dr. Jacqualyn Posey can do it on October 09, 2019.  "Okay, put me down for then."  Dr. Jacqualyn Posey wants you to wait until December to have the bloodwork done.  Do not do it today.  "Okay, I understand.  I'll do it in December.  What time will I need to be there for my surgery?  Will it be the morning time?"  Someone from the surgical center will give you a call a day or two prior to your surgery date and will give you your arrival time.  "Is the address for the surgery center [address]?"  Yes, that is the address for [facility].

## 2019-08-28 ENCOUNTER — Telehealth: Payer: Self-pay | Admitting: Pharmacist

## 2019-08-30 ENCOUNTER — Ambulatory Visit: Payer: Self-pay

## 2019-08-30 DIAGNOSIS — I10 Essential (primary) hypertension: Secondary | ICD-10-CM

## 2019-08-30 DIAGNOSIS — Z794 Long term (current) use of insulin: Secondary | ICD-10-CM

## 2019-08-30 DIAGNOSIS — E11649 Type 2 diabetes mellitus with hypoglycemia without coma: Secondary | ICD-10-CM

## 2019-08-30 NOTE — Chronic Care Management (AMB) (Signed)
Care Management   Follow Up Note   08/30/2019 Name: Kara Kelley MRN: [id number] DOB: 30-Jul-1956  Referred by: [name], FNP Reason for referral : Care Coordination   Kara Kelley is a 63 y.o. year old female who is a primary care patient of [name], [place]. The care management team was consulted for assistance with care management and care coordination needs.    Review of patient status, including review of consultants reports, relevant laboratory and other test results, and collaboration with appropriate care team members and the patient's provider was performed as part of comprehensive patient evaluation and provision of chronic care management services.    SW placed an outbound call to the patient in response to a message received requesting assistance in reviewing mailed materials. Determined the patient had questions regarding meal planning versus advance directive materials. During the conversation the patient stated she has yet to receive advance directive packet. Performed chart review to determine the patient had an [place] office visit. SW advised the patient to obtain materials from the practice and to contact SW if needed. SW collaborated with patients primary care team requesting material be provided at [place] visit.  Goals Addressed            This Visit's Progress     Patient Stated   . "I need to learn how to eat better" (pt-stated)       Current Barriers:  [name] Knowledge Deficits related to diabetes disease process and Self Health Mangement  . A1C 6.9  Nurse Case Manager Clinical Goal(s):  [name] Over the next 60 days, patient will work with the CCM team to address needs related to diabetes disease education and improved Self Health management  . Over the next 90 days, patient will lower her A1C <6.9  CCM RN CM Interventions:  07/15/19 call completed with patient   . Evaluation of current treatment plan related to diabetes and patient's adherence  to plan as established by provider. . Provided education to patient re: patient's most recent A1C of 6.9 obtained on 06/19/19; discussed target A1C <5.6; education provided related to dietary recommendations including Meal Planning with using the Plate Method and lowering intake of carbohydrates; discussed the importance of implementing a routine exercise regimen; discussed ADA recommendations of 150 minutes per week; discussed importance of taking medications exactly as prescribed; discussed her recent blood glucose was noted to be 62; patient admits to not eating breakfast prior to labs; she routinely does not eat breakfast on a regular basis; patient is not checking her CBG's routinely, she does feel comfortable with using her glucometer and will start monitoring daily before meals and will log her readings  . Reviewed medications with patient and discussed indication, dosage and frequency of her prescribed diabetic medications; discussed embedded Pharm D Kara Kelley will review her current regimen and will discuss recommendations for any changes needed to get the best pharmacological management of her DM . Collaborated with embedded Pharm D Kara Kelley regarding outreach needed to patient to review her current pharmacological treatment regimen . Discussed plans with patient for ongoing care management follow up and provided patient with direct contact information for care management team . Provided patient with printed educational materials related to Diabetes disease management; Meal Planning using the Plate Method; DM Zone Safety Tool; Carb Counting . Advised patient, providing education and rationale, to check cbg daily before meals and record, calling the CCM team and or PCP provider for findings outside established parameters.  <80  and or >250  CCM SW Interventions Completed with the patient 08/30/2019 . Communication with the patient whom confirms receipt of mailed resources o "I have the  information but I am confused about how to measure" . Advised the patient RN Case Manager Kara Kelley provided the patient with that information and would be better equipped to review guidelines . Collaboration with RN Case Manager requesting follow up with the patient regarding meal planning education  Patient Self Care Activities:  . Self administers medications as prescribed . Attends all scheduled provider appointments . Calls pharmacy for medication refills . Attends church or other social activities . Performs ADL's independently . Performs IADL's independently . Calls provider office for new concerns or questions  Initial goal documentation         No SW follow up planned at this time. SW has collaborated with RN Case Production designer, theatre/television/film regarding patient desire to review meal [place] [facility].  Bevelyn Ngo, BSW, CDP Social Worker, Certified Dementia Practitioner [facility] Care Management [phone]

## 2019-09-03 ENCOUNTER — Telehealth: Payer: Self-pay

## 2019-09-03 NOTE — Telephone Encounter (Signed)
Patient called wanting to know if she could get an appointment to get a shingles shot I advised her that we don't carry that shot here in the office she would have to go to the pharmacy and get it she was advised to go anytime just make sure she isnt sick and pt asked about covid testing she was advised she can go to the drive through at [place]

## 2019-09-03 NOTE — Telephone Encounter (Signed)
Okay I have made myself a note to give pt an advance directive packet when she comes into the office in dec. YRL,RMA

## 2019-09-03 NOTE — Telephone Encounter (Signed)
-----   Message from Octavio Manns sent at 09/02/2019 12:18 PM EST -----  ----- Message ----- From: Daneen Schick Sent: 08/30/2019  12:02 PM EST To: [name], FNP, Octavio Manns  Happy Friday!  Mrs Mooty would like to receive an advance directive packet. In effort to reduce letters I have let her know I would ask the office to give her one at her [place] in December. If the practice does not have hard copies please let me know and I can print one.  Thank you! Tillie Rung

## 2019-09-04 ENCOUNTER — Telehealth: Payer: Self-pay

## 2019-09-07 ENCOUNTER — Other Ambulatory Visit: Payer: Self-pay

## 2019-09-07 DIAGNOSIS — Z87891 Personal history of nicotine dependence: Secondary | ICD-10-CM | POA: Diagnosis not present

## 2019-09-07 DIAGNOSIS — I10 Essential (primary) hypertension: Secondary | ICD-10-CM | POA: Insufficient documentation

## 2019-09-07 DIAGNOSIS — Z79899 Other long term (current) drug therapy: Secondary | ICD-10-CM | POA: Insufficient documentation

## 2019-09-07 DIAGNOSIS — E119 Type 2 diabetes mellitus without complications: Secondary | ICD-10-CM | POA: Insufficient documentation

## 2019-09-07 DIAGNOSIS — R519 Headache, unspecified: Secondary | ICD-10-CM | POA: Diagnosis not present

## 2019-09-07 DIAGNOSIS — Z794 Long term (current) use of insulin: Secondary | ICD-10-CM | POA: Diagnosis not present

## 2019-09-07 DIAGNOSIS — J449 Chronic obstructive pulmonary disease, unspecified: Secondary | ICD-10-CM | POA: Diagnosis not present

## 2019-09-08 ENCOUNTER — Other Ambulatory Visit: Payer: Self-pay

## 2019-09-08 ENCOUNTER — Encounter (HOSPITAL_COMMUNITY): Payer: Self-pay | Admitting: Emergency Medicine

## 2019-09-08 ENCOUNTER — Emergency Department (HOSPITAL_COMMUNITY): Payer: Medicaid Other

## 2019-09-08 ENCOUNTER — Emergency Department (HOSPITAL_COMMUNITY)
Admission: EM | Admit: 2019-09-08 | Discharge: 2019-09-08 | Disposition: A | Discharge status: Home / Self Care | Payer: Medicaid Other | Attending: Emergency Medicine | Admitting: Emergency Medicine

## 2019-09-08 DIAGNOSIS — R519 Headache, unspecified: Secondary | ICD-10-CM

## 2019-09-08 LAB — BASIC METABOLIC PANEL
Anion gap: 9 (ref 5–15)
BUN: 18 mg/dL (ref 8–23)
CO2: 21 mmol/L — ABNORMAL LOW (ref 22–32)
Calcium: 9.6 mg/dL (ref 8.9–10.3)
Chloride: 107 mmol/L (ref 98–111)
Creatinine, Ser: 1.2 mg/dL — ABNORMAL HIGH (ref 0.44–1.00)
GFR calc Af Amer: 56 mL/min — ABNORMAL LOW (ref >60)
GFR calc non Af Amer: 49 mL/min — ABNORMAL LOW (ref >60)
Glucose, Bld: 106 mg/dL — ABNORMAL HIGH (ref 70–99)
Potassium: 3.9 mmol/L (ref 3.5–5.1)
Sodium: 137 mmol/L (ref 135–145)

## 2019-09-08 LAB — CBC
HCT: 34.1 % — ABNORMAL LOW (ref 36.0–46.0)
Hemoglobin: 10.9 g/dL — ABNORMAL LOW (ref 12.0–15.0)
MCH: 25.9 pg — ABNORMAL LOW (ref 26.0–34.0)
MCHC: 32 g/dL (ref 30.0–36.0)
MCV: 81 fL (ref 80.0–100.0)
Platelets: 166 10*3/uL (ref 150–400)
RBC: 4.21 MIL/uL (ref 3.87–5.11)
RDW: 15.2 % (ref 11.5–15.5)
WBC: 9.1 10*3/uL (ref 4.0–10.5)
nRBC: 0 % (ref 0.0–0.2)

## 2019-09-08 LAB — TROPONIN I (HIGH SENSITIVITY): Troponin I (High Sensitivity): <2 ng/L (ref <18)

## 2019-09-08 MED ORDER — SODIUM CHLORIDE 0.9% FLUSH: 3.0000 mL | Freq: Once | INTRAVENOUS | Status: DC

## 2019-09-08 MED ORDER — HYDROCODONE-ACETAMINOPHEN 5-325 MG PO TABS
1.0000 | ORAL_TABLET | Freq: Once | ORAL | Status: AC
  Administered 2019-09-08: 1 via ORAL
  Filled 2019-09-08: qty 1

## 2019-09-08 NOTE — ED Notes (Signed)
Pt states pain started as a left temporal headache that radiated to the right side of the head and traveled down the right side of her face and jaw to the right shoulder. Pt stated that the pain was associated with blurry vision that has since subsided. Pt denies pain in her chest. Pt states pain is In head, shoulder, kneck and right arm. Pt states she had mild nausea earlier that has since subsided, pt denies any vomiting.

## 2019-09-08 NOTE — ED Notes (Signed)
Pt transported to CT ?
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
## 2019-09-08 NOTE — ED Triage Notes (Signed)
Patient complaining of headache that going to her neck to her shoulders to her left arm. Patient states the blood pressure was high. Patient states it started about two hours ago.

## 2019-09-08 NOTE — Discharge Instructions (Addendum)
Please see your doctor if you have further headache pain.

## 2019-09-09 ENCOUNTER — Telehealth: Payer: Self-pay | Admitting: *Deleted

## 2019-09-09 ENCOUNTER — Telehealth: Payer: Self-pay

## 2019-09-09 NOTE — Telephone Encounter (Signed)
"  I'm scheduled for surgery on 10/09/19.  My foot is killing me.  So, I'm calling to see if he can do it sooner."  Dr. Jacqualyn Posey can do it on September 25, 2019.  "Can you move me to there?"  Yes, I will reschedule you to 09/25/2019.  "Will I still use the same paperwork?"  Yes, you will use the same paperwork.  Someone from the surgical center will give you a call a day or two prior to your surgery date and will give you your arrival time.  I called Caren Griffins at the surgical center and rescheduled Ms. Basham's surgery from 10/09/2019 to 09/25/2019.

## 2019-09-16 ENCOUNTER — Telehealth: Payer: Self-pay | Admitting: *Deleted

## 2019-09-16 NOTE — Telephone Encounter (Signed)
"  I'm scheduled for an appointment on December 16.  I received an email that's telling me to do something online.  I am computer illiterate."  No worries, you'll receive a call from someone at the surgery center.  That person will ask you for the needed information.  "Alright, thank you."

## 2019-09-17 ENCOUNTER — Telehealth: Payer: Self-pay

## 2019-09-17 ENCOUNTER — Telehealth (INDEPENDENT_AMBULATORY_CARE_PROVIDER_SITE_OTHER): Payer: Medicaid Other | Admitting: Nurse Practitioner

## 2019-09-17 ENCOUNTER — Other Ambulatory Visit: Payer: Self-pay

## 2019-09-17 DIAGNOSIS — I10 Essential (primary) hypertension: Secondary | ICD-10-CM

## 2019-09-17 DIAGNOSIS — Z87898 Personal history of other specified conditions: Secondary | ICD-10-CM | POA: Diagnosis not present

## 2019-09-17 DIAGNOSIS — R252 Cramp and spasm: Secondary | ICD-10-CM | POA: Diagnosis not present

## 2019-09-17 DIAGNOSIS — E11649 Type 2 diabetes mellitus with hypoglycemia without coma: Secondary | ICD-10-CM

## 2019-09-17 DIAGNOSIS — Z794 Long term (current) use of insulin: Secondary | ICD-10-CM

## 2019-09-17 DIAGNOSIS — Z09 Encounter for follow-up examination after completed treatment for conditions other than malignant neoplasm: Secondary | ICD-10-CM

## 2019-09-17 MED ORDER — MAGNESIUM 200 MG PO TABS: ORAL_TABLET | ORAL | 2 refills | Status: DC

## 2019-09-17 NOTE — Progress Notes (Signed)
Virtual Visit via Video   This visit type was conducted due to national recommendations for restrictions regarding the COVID-19 Pandemic (e.g. social distancing) in an effort to limit this patient's exposure and mitigate transmission in our community.  Due to her co-morbid illnesses, this patient is at least at moderate risk for complications without adequate follow up.  This format is felt to be most appropriate for this patient at this time.  All issues noted in this document were discussed and addressed.  A limited physical exam was performed with this format.    This visit type was conducted due to national recommendations for restrictions regarding the COVID-19 Pandemic (e.g. social distancing) in an effort to limit this patient's exposure and mitigate transmission in our community.  Patients identity confirmed using two different identifiers.  This format is felt to be most appropriate for this patient at this time.  All issues noted in this document were discussed and addressed.  No physical exam was performed (except for noted visual exam findings with Video Visits).    Date:  09/20/2019   ID:  Kara Kelley, DOB 09-Feb-1956, MRN [id number]  Patient Location:  Home - spoke with [name]  Provider location:   Office    Chief Complaint:  ER follow with headache and diabetes follow up  History of Present Illness:    Kara Kelley is a 63 y.o. female who presents via video conferencing for a telehealth visit today.    The patient does not have symptoms concerning for COVID-19 infection (fever, chills, cough, or new shortness of breath).   She had a visit at the ER for severe headache on 09/08/2019 with a CT scan being done and was normal.  No abnormal findings.  Since that time she is feeling better.    She is taking novolog as needed due to her blood sugars being low.   She is having leg spasms at night   Diabetes She presents for her follow-up diabetic visit. She  has type 2 diabetes mellitus. Her disease course has been stable. There are no hypoglycemic associated symptoms. Pertinent negatives for hypoglycemia include no dizziness or headaches. There are no diabetic associated symptoms. There are no hypoglycemic complications. Symptoms are stable. There are no diabetic complications. Risk factors for coronary artery disease include diabetes mellitus. She is compliant with treatment all of the time. When asked about meal planning, she reported none. (Highest her blood sugar is 149.  She reports they are dropping 50's.  ) An ACE inhibitor/angiotensin II receptor blocker is being taken. She does not see a podiatrist.Eye exam is not current.     Past Medical History:  Diagnosis Date  . Anemia   . Anxiety   . Arthritis   . Asthma   . Bunion   . Callus   . Chronic pain   . Cocaine abuse ([place])   . COPD (chronic obstructive pulmonary disease) ([place])   . Corns and callosities   . Degenerative joint disease   . Depression   . Diabetes mellitus   . Endometrial polyp   . ETOH abuse   . Gall stones   . GERD (gastroesophageal reflux disease)   . Headache    history of Migraines  . Hepatitis C    Hep C  . Hyperlipidemia   . Hypertension   . Insomnia   . Spondylolisthesis of lumbar region   . Substance abuse ([place])    alcoholism  . Tuberculosis 1985  . Wears  dentures   . Wears glasses    Past Surgical History:  Procedure Laterality Date  . CESAREAN SECTION     x3  . CHOLECYSTECTOMY N/A 08/11/2016   Procedure: LAPAROSCOPIC CHOLECYSTECTOMY WITH   INTRAOPERATIVE CHOLANGIOGRAM;  Surgeon: Stark Klein, MD;  Location: [facility];  Service: General;  Laterality: N/A;  . COLONOSCOPY    . Cotton Osteotomy w/ Graft Left 06/18/2009  . Excision of Benign Lesion Right 01/30/2013   Rt Plantar  . FOOT SURGERY    . HAMMER TOE REPAIR Right 02/12/2016   RIGHT #5  . Hammertoe Repair Left 06/18/2009   Lt #5  . HYSTEROSCOPY N/A 06/20/2017   Procedure: DILATION AND  CURETTAGE, HYSTEROSCOPY w/ Polypectomy;  Surgeon: Lavonia Drafts, MD;  Location: [facility];  Service: Gynecology;  Laterality: N/A;  . LUMBAR FUSION  2018  . METATARSAL OSTEOTOMY Left 06/18/2009   #5  . MULTIPLE TOOTH EXTRACTIONS    . Nail Matrixectomy Left 06/18/2009   LT #1  . OSTEOTOMY Right 01/30/2013   Rt #5  . Phalangectomy Left 06/18/2009   LT #1  . Phalangectomy Right 01/30/2013   Rt #1  . TUBAL LIGATION       Current Meds  Medication Sig  . albuterol (PROVENTIL HFA;VENTOLIN HFA) 108 (90 Base) MCG/ACT inhaler Inhale 2 puffs into the lungs every 6 (six) hours as needed. For shortness of breath.  . ARIPiprazole (ABILIFY) 10 MG tablet Take 10 mg by mouth daily.   . baclofen (LIORESAL) 10 MG tablet Take 1 tablet (10 mg total) by mouth 3 (three) times daily as needed for muscle spasms.  . Blood Glucose Monitoring Suppl (ACCU-CHEK NANO SMARTVIEW) w/Device KIT 1 each by Does not apply route 3 (three) times daily.  [name] buPROPion (WELLBUTRIN XL) 300 MG 24 hr tablet Take 300 mg by mouth every morning.  [name] CIPRODEX OTIC suspension SHAKE LQ AND INT 4 GTS IN AU BID X 7 DAYS  . cyclobenzaprine (FLEXERIL) 5 MG tablet TK 1 T PO Q 12 H  . glucose blood test strip Use as instructed  . Insulin Detemir (LEVEMIR FLEXTOUCH) 100 UNIT/ML Pen Inject 40 Units into the skin at bedtime. (Patient taking differently: Inject 35 Units into the skin at bedtime. )  . Insulin Pen Needle (PEN NEEDLES) 32G X 4 MM MISC 1 each by Does not apply route 3 (three) times daily.  [name] LINZESS 145 MCG CAPS capsule TK ONE C PO AT LEAST 30 MINUTES BEFORE THE FIRST MEAL OF THE DAY ON A EMPTY STOMACH  . lisinopril (ZESTRIL) 10 MG tablet Take 1 tablet (10 mg total) by mouth daily.  . meloxicam (MOBIC) 15 MG tablet Take 1 tablet (15 mg total) by mouth daily.  [name] NOVOLOG FLEXPEN 100 UNIT/ML FlexPen ADMINISTER 18 UNITS UNDER THE SKIN THREE TIMES DAILY AS NEEDED FOR HIGH BLOOD SUGAR  . omeprazole (PRILOSEC) 40 MG capsule Take 1 capsule (40  mg total) by mouth daily before breakfast.  . ondansetron (ZOFRAN) 4 MG tablet Take 1 tablet (4 mg total) by mouth daily as needed for nausea or vomiting. From Ozempic  . promethazine (PHENERGAN) 25 MG tablet TK 1 T PO Q 8 H PRF NAUSEA OR VOM  . Semaglutide,0.25 or 0.5MG/DOS, (OZEMPIC, 0.25 OR 0.5 MG/DOSE,) 2 MG/1.5ML SOPN Inject 0.5 mg into the skin once a week.  . spironolactone (ALDACTONE) 50 MG tablet Take 1 tablet (50 mg total) by mouth daily.  . SUBOXONE 2-0.5 MG FILM DISSOLVE 1 FILM UNDER THE TONGUE ONCE DAILY  .  traZODone (DESYREL) 150 MG tablet TK 1 T PO HS     Allergies:   Patient has no known allergies.   Social History   Tobacco Use  . Smoking status: Former Smoker    Packs/day: 0.50    Types: Cigarettes    Quit date: 04/10/2017    Years since quitting: 2.4  . Smokeless tobacco: Current User  Substance Use Topics  . Alcohol use: No    Alcohol/week: 0.0 standard drinks    Comment: in rehab  . Drug use: No    Comment: in rehab     Family Hx: The patient's family history includes Cirrhosis in some other family members; Diabetes in an other family member; Heart disease in her mother. There is no history of Colon cancer or Breast cancer.  ROS:   Please see the history of present illness.    Review of Systems  Constitutional: Negative.   Respiratory: Negative.   Cardiovascular: Negative.   Musculoskeletal: Negative.   Neurological: Negative for dizziness and headaches.    All other systems reviewed and are negative.   Labs/Other Tests and Data Reviewed:    Recent Labs: 03/07/2019: ALT 13 09/08/2019: BUN 18; Creatinine, Ser 1.20; Hemoglobin 10.9; Platelets 166; Potassium 3.9; Sodium 137   Recent Lipid Panel Lab Results  Component Value Date/Time   CHOL 105 03/07/2019 12:34 PM   TRIG 64 03/07/2019 12:34 PM   HDL 50 03/07/2019 12:34 PM   CHOLHDL 2.1 03/07/2019 12:34 PM   LDLCALC 42 03/07/2019 12:34 PM    Wt Readings from Last 3 Encounters:  09/08/19 210 lb  (95.3 kg)  06/19/19 201 lb 3.2 oz (91.3 kg)  12/13/18 207 lb 6.4 oz (94.1 kg)     Exam:    Vital Signs:  There were no vitals taken for this visit.    Physical Exam  Constitutional: She is oriented to person, place, and time and well-developed, well-nourished, and in no distress. No distress.  Pulmonary/Chest: Effort normal. No respiratory distress.  Neurological: She is alert and oriented to person, place, and time.  Psychiatric: Mood, memory, affect and judgment normal.    ASSESSMENT & PLAN:    1. Leg cramps  Intermittent leg cramps at night  Encouraged to drink a glass of water before bed and stay well hydrated  She is to also take magnesium with her evening meal - Magnesium 200 MG TABS; Take one tablet by mouth with evening meal.  Dispense: 30 tablet; Refill: 2 - Magnesium; Future  2. Type 2 diabetes mellitus with hypoglycemia without coma, with long-term current use of insulin (HCC)  Chronic, improving  She is reporting low blood sugars I have advised her to stop her novolog and to monitor her blood sugars  I will check her HgbA1c  Continue with current medications  Encouraged to limit intake of sugary foods and drinks  Encouraged to increase physical activity to 150 minutes per week - CMP14+EGFR - Lipid Profile - Hemoglobin A1c  3. Essential hypertension . Chronic, no blood pressure today she is at home and does not have a cuff  . CMP ordered to check renal function.  . The importance of regular exercise and dietary modification was stressed to the patient.   - CMP14+EGFR   COVID-19 Education: The signs and symptoms of COVID-19 were discussed with the patient and how to seek care for testing (follow up with PCP or arrange E-visit).  The importance of social distancing was discussed today.  Patient Risk:   After  full review of this patients clinical status, I feel that they are at least moderate risk at this time.  Time:   Today, I have spent 13  minutes/ seconds with the patient with telehealth technology discussing above diagnoses.     Medication Adjustments/Labs and Tests Ordered: Current medicines are reviewed at length with the patient today.  Concerns regarding medicines are outlined above.   Tests Ordered: Orders Placed This Encounter  Procedures  . CMP14+EGFR  . Lipid Profile  . Hemoglobin A1c  . Magnesium    Medication Changes: Meds ordered this encounter  Medications  . Magnesium 200 MG TABS    Sig: Take one tablet by mouth with evening meal.    Dispense:  30 tablet    Refill:  2    Disposition:  Follow up in 3 month(s)  Signed, [name], FNP

## 2019-09-18 ENCOUNTER — Other Ambulatory Visit: Payer: Self-pay

## 2019-09-19 ENCOUNTER — Ambulatory Visit: Payer: Medicaid Other | Admitting: Nurse Practitioner

## 2019-09-20 ENCOUNTER — Encounter: Payer: Self-pay | Admitting: Nurse Practitioner

## 2019-09-23 ENCOUNTER — Other Ambulatory Visit: Payer: Self-pay

## 2019-09-24 ENCOUNTER — Other Ambulatory Visit: Payer: Self-pay

## 2019-09-24 ENCOUNTER — Other Ambulatory Visit: Payer: Self-pay | Admitting: Nurse Practitioner

## 2019-09-24 ENCOUNTER — Other Ambulatory Visit: Payer: Medicaid Other

## 2019-09-24 ENCOUNTER — Telehealth: Payer: Self-pay | Admitting: Podiatry

## 2019-09-24 LAB — CBC WITH DIFFERENTIAL/PLATELET
Absolute Monocytes: 378 cells/uL (ref 200–950)
Basophils Absolute: 49 cells/uL (ref 0–200)
Basophils Relative: 0.7 %
Eosinophils Absolute: 350 cells/uL (ref 15–500)
Eosinophils Relative: 5 %
HCT: 31.9 % — ABNORMAL LOW (ref 35.0–45.0)
Hemoglobin: 10.4 g/dL — ABNORMAL LOW (ref 11.7–15.5)
Lymphs Abs: 2268 cells/uL (ref 850–3900)
MCH: 25.7 pg — ABNORMAL LOW (ref 27.0–33.0)
MCHC: 32.6 g/dL (ref 32.0–36.0)
MCV: 79 fL — ABNORMAL LOW (ref 80.0–100.0)
MPV: 10.4 fL (ref 7.5–12.5)
Monocytes Relative: 5.4 %
Neutro Abs: 3955 cells/uL (ref 1500–7800)
Neutrophils Relative %: 56.5 %
Platelets: 189 10*3/uL (ref 140–400)
RBC: 4.04 10*6/uL (ref 3.80–5.10)
RDW: 15.2 % — ABNORMAL HIGH (ref 11.0–15.0)
Total Lymphocyte: 32.4 %
WBC: 7 10*3/uL (ref 3.8–10.8)

## 2019-09-24 LAB — COMPLETE METABOLIC PANEL WITH GFR
AG Ratio: 1.3 (calc) (ref 1.0–2.5)
ALT: 16 U/L (ref 6–29)
AST: 17 U/L (ref 10–35)
Albumin: 4 g/dL (ref 3.6–5.1)
Alkaline phosphatase (APISO): 91 U/L (ref 37–153)
BUN/Creatinine Ratio: 11 (calc) (ref 6–22)
BUN: 15 mg/dL (ref 7–25)
CO2: 21 mmol/L (ref 20–32)
Calcium: 9.1 mg/dL (ref 8.6–10.4)
Chloride: 105 mmol/L (ref 98–110)
Creat: 1.32 mg/dL — ABNORMAL HIGH (ref 0.50–0.99)
GFR, Est African American: 50 mL/min/{1.73_m2} — ABNORMAL LOW (ref >=60)
GFR, Est Non African American: 43 mL/min/{1.73_m2} — ABNORMAL LOW (ref >=60)
Globulin: 3.2 g/dL (calc) (ref 1.9–3.7)
Glucose, Bld: 162 mg/dL — ABNORMAL HIGH (ref 65–139)
Potassium: 5.3 mmol/L (ref 3.5–5.3)
Sodium: 135 mmol/L (ref 135–146)
Total Bilirubin: 0.3 mg/dL (ref 0.2–1.2)
Total Protein: 7.2 g/dL (ref 6.1–8.1)

## 2019-09-24 LAB — HEMOGLOBIN A1C
Hgb A1c MFr Bld: 7.1 % of total Hgb — ABNORMAL HIGH (ref <5.7)
Mean Plasma Glucose: 157 (calc)
eAG (mmol/L): 8.7 (calc)

## 2019-09-24 NOTE — Telephone Encounter (Signed)
Attempted to call patient to go over her surgery tomorrow and to see if she had any additional questions or concerns. No answer.

## 2019-09-25 ENCOUNTER — Telehealth: Payer: Self-pay | Admitting: Sports Medicine

## 2019-09-25 ENCOUNTER — Encounter: Payer: Self-pay | Admitting: Podiatry

## 2019-09-25 ENCOUNTER — Other Ambulatory Visit: Payer: Self-pay | Admitting: Podiatry

## 2019-09-25 DIAGNOSIS — M21542 Acquired clubfoot, left foot: Secondary | ICD-10-CM

## 2019-09-25 LAB — MAGNESIUM: Magnesium: 1.7 mg/dL (ref 1.6–2.3)

## 2019-09-25 MED ORDER — HYDROCODONE-ACETAMINOPHEN 5-325 MG PO TABS: 1.0000 | ORAL_TABLET | Freq: Four times a day (QID) | ORAL | 0 refills | Status: DC | PRN

## 2019-09-25 MED ORDER — PROMETHAZINE HCL 25 MG PO TABS: 25.0000 mg | ORAL_TABLET | Freq: Three times a day (TID) | ORAL | 0 refills | Status: DC | PRN

## 2019-09-25 MED ORDER — DOXYCYCLINE HYCLATE 100 MG PO TABS: 100.0000 mg | ORAL_TABLET | Freq: Two times a day (BID) | ORAL | 0 refills | Status: DC

## 2019-09-25 NOTE — Progress Notes (Signed)
Postop pain medications sent.   On the medication list Suboxone was listed. She has not taken this in several months and she has not seen pain management in several months she reports. Prescribed vicodin with close motoring.

## 2019-09-25 NOTE — Telephone Encounter (Signed)
Patient called answering service stating that she is in a lot of pain took Norco 1 hour ago. I advised her to take OTC Motrin or Aleve in between for additional relief and continue with rest, ice, elevation, and if its still hurting to loosen ace wrap but do not remove surgical dressing. Advised patient to call back if pain is not better. Patient thanked me for calling her back. -Dr. Cannon Kettle

## 2019-09-26 ENCOUNTER — Telehealth: Payer: Self-pay | Admitting: *Deleted

## 2019-09-26 NOTE — Telephone Encounter (Signed)
Called and left a message for the patient to call me back and I was calling the patient to see how patient was doing after surgery with Dr Jacqualyn Posey on Wednesday September 25, 2019. Lattie Haw

## 2019-09-26 NOTE — Telephone Encounter (Signed)
error 

## 2019-09-30 ENCOUNTER — Other Ambulatory Visit: Payer: Self-pay

## 2019-09-30 ENCOUNTER — Ambulatory Visit (INDEPENDENT_AMBULATORY_CARE_PROVIDER_SITE_OTHER): Payer: Medicaid Other

## 2019-09-30 ENCOUNTER — Ambulatory Visit: Payer: Medicaid Other | Admitting: Podiatry

## 2019-09-30 DIAGNOSIS — M216X2 Other acquired deformities of left foot: Secondary | ICD-10-CM | POA: Diagnosis not present

## 2019-09-30 DIAGNOSIS — Z9889 Other specified postprocedural states: Secondary | ICD-10-CM

## 2019-10-01 ENCOUNTER — Other Ambulatory Visit: Payer: Self-pay | Admitting: Nurse Practitioner

## 2019-10-01 DIAGNOSIS — K219 Gastro-esophageal reflux disease without esophagitis: Secondary | ICD-10-CM

## 2019-10-06 NOTE — Progress Notes (Signed)
Subjective: Kara Kelley is a 63 y.o. is seen today in office s/p left foot 5th metatarsal head resection.  Overall he states that she is doing well and her pain is controlled.  She gets occasional sharp pains but not consistent.  She is trying to take pain medication.  Denies any systemic complaints such as fevers, chills, nausea, vomiting. No calf pain, chest pain, shortness of breath.   Objective: General: No acute distress, AAOx3  DP/PT pulses palpable 2/4, CRT < 3 sec to all digits.  Protective sensation intact. Motor function intact.  LEFT foot: Incision is well coapted without any evidence of dehiscence and sutures are intact. There is no surrounding erythema, ascending cellulitis, fluctuance, crepitus, malodor, drainage/purulence. There is minimal edema around the surgical site. There is minimal pain along the surgical site.  No other areas of tenderness to bilateral lower extremities.  No other open lesions or pre-ulcerative lesions.  No pain with calf compression, swelling, warmth, erythema.   Assessment and Plan:  Status post left foot fifth metatarsal head excision, doing well with no complications   -Treatment options discussed including all alternatives, risks, and complications -Antibiotic ointment and dressing applied.  Keep dressing clean, dry, intact. -Remain in surgical shoe -Ice/elevation -Pain medication as needed. -Monitor for any clinical signs or symptoms of infection and DVT/PE and directed to call the office immediately should any occur or go to the ER. -Follow-up as scheduled for possible suture removal or sooner if any problems arise. In the meantime, encouraged to call the office with any questions, concerns, change in symptoms.   Celesta Gentile, DPM

## 2019-10-08 ENCOUNTER — Encounter: Payer: Medicaid Other | Admitting: Podiatry

## 2019-10-08 ENCOUNTER — Telehealth: Payer: Self-pay

## 2019-10-08 NOTE — Telephone Encounter (Signed)
Patient called at 11:15am this morning, asking when is her next appointment. Advised that it was this morning at 8:15. She reports that she has had a death in the family and that's why she forgot her appointment. Can we work her in on Thursday? Her daughter is her ride and goes to work this afternoon at 1:00pm Please advise Thanks, Water quality scientist

## 2019-10-08 NOTE — Telephone Encounter (Signed)
Raymond Gurney, can we get the patient in on Thursday. Thanks Lattie Haw

## 2019-10-08 NOTE — Telephone Encounter (Signed)
Yes, that is fine to have her come in Thursday. I can also start early as we do not have a doctors meeting.

## 2019-10-10 ENCOUNTER — Ambulatory Visit (INDEPENDENT_AMBULATORY_CARE_PROVIDER_SITE_OTHER): Payer: Medicaid Other | Admitting: Podiatry

## 2019-10-10 ENCOUNTER — Other Ambulatory Visit: Payer: Self-pay

## 2019-10-10 DIAGNOSIS — Z9889 Other specified postprocedural states: Secondary | ICD-10-CM

## 2019-10-10 DIAGNOSIS — M216X2 Other acquired deformities of left foot: Secondary | ICD-10-CM

## 2019-10-10 NOTE — Progress Notes (Signed)
Subjective: Kara Kelley is a 63 y.o. is seen today in office s/p left foot 5th metatarsal head resection.  She presents here for possible suture removal.  She said that she is doing well.  Her pain is controlled.  She is trying not to take any pain medication.  She denies any increase in swelling or any discomfort.  She still in a surgical shoe.  Denies any systemic complaints such as fevers, chills, nausea, vomiting. No calf pain, chest pain, shortness of breath.   Objective: General: No acute distress, AAOx3  DP/PT pulses palpable 2/4, CRT < 3 sec to all digits.  Protective sensation intact. Motor function intact.  LEFT foot: Incision is well coapted without any evidence of dehiscence and sutures are intact.  I removed 1 suture however after removal there is still some motion across the incision.  There is no surrounding erythema there is no drainage or pus or any signs of infection.  No significant tenderness to palpation.  No other open lesions or pre-ulcerative lesions.  No pain with calf compression, swelling, warmth, erythema.   Assessment and Plan:  Status post left foot fifth metatarsal head excision, doing well with no complications   -Treatment options discussed including all alternatives, risks, and complications -Remove 1 suture today.  Cavus motion across the incision site left with sutures intact. Antibiotic ointment and dressing applied.  Keep dressing clean, dry, intact. -Remain in surgical shoe -Ice/elevation -Pain medication as needed. -Monitor for any clinical signs or symptoms of infection and DVT/PE and directed to call the office immediately should any occur or go to the ER. -Follow-up as scheduled for possible suture removal or sooner if any problems arise. In the meantime, encouraged to call the office with any questions, concerns, change in symptoms.   Celesta Gentile, DPM

## 2019-10-14 ENCOUNTER — Telehealth: Payer: Self-pay

## 2019-10-14 NOTE — Telephone Encounter (Signed)
PT CALLED TO CANCEL APPT STATED SHE WILL CALL BACK WHEN SHE IS READY TO RESCHEDULE

## 2019-10-15 ENCOUNTER — Ambulatory Visit: Payer: Medicaid Other | Admitting: Nurse Practitioner

## 2019-10-16 ENCOUNTER — Ambulatory Visit: Payer: Medicaid Other | Admitting: Nurse Practitioner

## 2019-10-17 ENCOUNTER — Other Ambulatory Visit: Payer: Self-pay | Admitting: Podiatry

## 2019-10-17 ENCOUNTER — Ambulatory Visit (INDEPENDENT_AMBULATORY_CARE_PROVIDER_SITE_OTHER): Payer: Medicaid Other

## 2019-10-17 ENCOUNTER — Ambulatory Visit: Payer: Medicaid Other | Admitting: Podiatry

## 2019-10-17 ENCOUNTER — Other Ambulatory Visit: Payer: Self-pay

## 2019-10-17 DIAGNOSIS — Q828 Other specified congenital malformations of skin: Secondary | ICD-10-CM

## 2019-10-17 DIAGNOSIS — Z9889 Other specified postprocedural states: Secondary | ICD-10-CM

## 2019-10-17 DIAGNOSIS — S99922A Unspecified injury of left foot, initial encounter: Secondary | ICD-10-CM

## 2019-10-17 DIAGNOSIS — M216X2 Other acquired deformities of left foot: Secondary | ICD-10-CM

## 2019-10-17 MED ORDER — HYDROCODONE-ACETAMINOPHEN 5-325 MG PO TABS: 1.0000 | ORAL_TABLET | Freq: Four times a day (QID) | ORAL | 0 refills | Status: DC | PRN

## 2019-10-21 ENCOUNTER — Other Ambulatory Visit: Payer: Self-pay

## 2019-10-21 ENCOUNTER — Ambulatory Visit (INDEPENDENT_AMBULATORY_CARE_PROVIDER_SITE_OTHER): Payer: Medicaid Other | Admitting: Podiatry

## 2019-10-21 DIAGNOSIS — Z4800 Encounter for change or removal of nonsurgical wound dressing: Secondary | ICD-10-CM

## 2019-10-21 NOTE — Progress Notes (Signed)
Subjective: Kara Kelley is a 64 y.o. is seen today in office s/p left foot 5th metatarsal head resection.  She presents today for suture removal.  Overall she states that she is doing well but she does have some discomfort at the surgical site.  She states that she did hit her foot on a doorway at home.  She is asking for refill of pain medication.  She is still been wearing the surgical shoe. Denies any systemic complaints such as fevers, chills, nausea, vomiting. No calf pain, chest pain, shortness of breath.   Objective: General: No acute distress, AAOx3  DP/PT pulses palpable 2/4, CRT < 3 sec to all digits.  Protective sensation intact. Motor function intact.  LEFT foot: Incision is well coapted without any evidence of dehiscence and sutures are intact.  Minimal swelling to the surgical site as well as mild discomfort of the surgical site but no other areas of discomfort. No other open lesions or pre-ulcerative lesions.  No pain with calf compression, swelling, warmth, erythema.   Assessment and Plan:  Status post left foot fifth metatarsal head excision, foot injury today  -Treatment options discussed including all alternatives, risks, and complications -X-rays obtained reviewed.  No evidence of acute fracture.  Status post fifth metatarsal head excision. -I removed the sutures today without any complications.  Steri-Strips were applied for reinforcement.  Antibiotic ointment and a dressing applied.  She can move the bandage and start the shower in 2 days as long as the incision is healing well.  For now continue in surgical shoe.  Continue to ice and elevate as well.  As she starts to feel better she can start to transition to regular shoe as tolerated next week.  Discussed gradual transition.  Refill pain medication today.  Return in about 3 weeks (around 11/07/2019) for post-op check .  Vivi Barrack DPM

## 2019-10-22 ENCOUNTER — Other Ambulatory Visit: Payer: Self-pay | Admitting: Nurse Practitioner

## 2019-10-22 ENCOUNTER — Other Ambulatory Visit: Payer: Self-pay

## 2019-10-22 ENCOUNTER — Encounter: Payer: Medicaid Other | Admitting: Podiatry

## 2019-10-22 DIAGNOSIS — N183 Chronic kidney disease, stage 3 unspecified: Secondary | ICD-10-CM

## 2019-10-22 DIAGNOSIS — E1122 Type 2 diabetes mellitus with diabetic chronic kidney disease: Secondary | ICD-10-CM

## 2019-10-22 MED ORDER — NOVOLOG FLEXPEN 100 UNIT/ML ~~LOC~~ SOPN: PEN_INJECTOR | SUBCUTANEOUS | 0 refills | Status: DC

## 2019-10-24 ENCOUNTER — Other Ambulatory Visit: Payer: Self-pay

## 2019-10-24 ENCOUNTER — Encounter: Payer: Self-pay | Admitting: Nurse Practitioner

## 2019-10-24 ENCOUNTER — Ambulatory Visit: Payer: Medicaid Other | Admitting: Nurse Practitioner

## 2019-10-24 VITALS — BP 132/80 | HR 72 | Temp 98.2°F | Ht 59.8 in | Wt 218.4 lb

## 2019-10-24 DIAGNOSIS — M79641 Pain in right hand: Secondary | ICD-10-CM

## 2019-10-24 DIAGNOSIS — R202 Paresthesia of skin: Secondary | ICD-10-CM | POA: Diagnosis not present

## 2019-10-24 DIAGNOSIS — B182 Chronic viral hepatitis C: Secondary | ICD-10-CM

## 2019-10-24 DIAGNOSIS — M25531 Pain in right wrist: Secondary | ICD-10-CM

## 2019-10-24 DIAGNOSIS — E1165 Type 2 diabetes mellitus with hyperglycemia: Secondary | ICD-10-CM | POA: Diagnosis not present

## 2019-10-24 MED ORDER — KETOROLAC TROMETHAMINE 60 MG/2ML IM SOLN
60.0000 mg | Freq: Once | INTRAMUSCULAR | Status: AC
  Administered 2019-10-24: 60 mg via INTRAMUSCULAR

## 2019-10-24 NOTE — Progress Notes (Signed)
This visit occurred during the SARS-CoV-2 public health emergency.  Safety protocols were in place, including screening questions prior to the visit, additional usage of staff PPE, and extensive cleaning of exam room while observing appropriate contact time as indicated for disinfecting solutions.  Subjective:     Patient ID: Kara Kelley , female    DOB: 01/13/1956 , 64 y.o.   MRN: [id number]   Chief Complaint  Patient presents with  . Hand Pain    patient stated her hand has been tingling and numb. she stated it is worsening at night     HPI  She has not been able to sleep due to the pain.  She is left handed.  Hand Pain  The incident occurred more than 1 week ago. There was no injury mechanism. Pain location: right hand pain. The quality of the pain is described as stabbing and shooting. Radiates to: up right arm. Associated symptoms include numbness and tingling. Pertinent negatives include no chest pain. Nothing aggravates the symptoms.     Past Medical History:  Diagnosis Date  . Anemia   . Anxiety   . Arthritis   . Asthma   . Bunion   . Callus   . Chronic pain   . Cocaine abuse ([place])   . COPD (chronic obstructive pulmonary disease) ([place])   . Corns and callosities   . Degenerative joint disease   . Depression   . Diabetes mellitus   . Endometrial polyp   . ETOH abuse   . Gall stones   . GERD (gastroesophageal reflux disease)   . Headache    history of Migraines  . Hepatitis C    Hep C  . Hyperlipidemia   . Hypertension   . Insomnia   . Spondylolisthesis of lumbar region   . Substance abuse ([place])    alcoholism  . Tuberculosis 1985  . Wears dentures   . Wears glasses      Family History  Problem Relation Age of Onset  . Heart disease Mother   . Diabetes Other        mat great aunt  . Cirrhosis Other        mat great aunt  . Cirrhosis Other        mat great uncles x 2  . Colon cancer Neg Hx   . Breast cancer Neg Hx      Current Outpatient  Medications:  .  Accu-Chek FastClix Lancets MISC, USE TO CHECK BLOOD GLUCOSE THREE TIMES DAILY, Disp: 102 each, Rfl: prn .  albuterol (PROVENTIL HFA;VENTOLIN HFA) 108 (90 Base) MCG/ACT inhaler, Inhale 2 puffs into the lungs every 6 (six) hours as needed. For shortness of breath., Disp: 18 g, Rfl: 1 .  ARIPiprazole (ABILIFY) 10 MG tablet, Take 10 mg by mouth daily. , Disp: , Rfl:  .  atorvastatin (LIPITOR) 10 MG tablet, Take 1 tablet (10 mg total) by mouth daily., Disp: 90 tablet, Rfl: 0 .  baclofen (LIORESAL) 10 MG tablet, Take 1 tablet (10 mg total) by mouth 3 (three) times daily as needed for muscle spasms., Disp: 30 tablet, Rfl: 1 .  Blood Glucose Monitoring Suppl (ACCU-CHEK NANO SMARTVIEW) w/Device KIT, 1 each by Does not apply route 3 (three) times daily., Disp: 1 kit, Rfl: 0 .  buPROPion (WELLBUTRIN XL) 300 MG 24 hr tablet, Take 300 mg by mouth every morning., Disp: , Rfl: 0 .  CIPRODEX OTIC suspension, SHAKE LQ AND INT 4 GTS IN AU BID X 7  DAYS, Disp: , Rfl:  .  cyclobenzaprine (FLEXERIL) 5 MG tablet, TK 1 T PO Q 12 H, Disp: , Rfl:  .  doxycycline (VIBRA-TABS) 100 MG tablet, Take 1 tablet (100 mg total) by mouth 2 (two) times daily., Disp: 14 tablet, Rfl: 0 .  glucose blood test strip, Use as instructed, Disp: 300 each, Rfl: 3 .  HYDROcodone-acetaminophen (NORCO/VICODIN) 5-325 MG tablet, Take 1 tablet by mouth every 6 (six) hours as needed., Disp: 15 tablet, Rfl: 0 .  Insulin Pen Needle (PEN NEEDLES) 32G X 4 MM MISC, 1 each by Does not apply route 3 (three) times daily., Disp: 200 each, Rfl: 3 .  LEVEMIR FLEXTOUCH 100 UNIT/ML Pen, INJECT 40 UNITS INTO SKIN AT BEDTIME, Disp: 15 mL, Rfl: 1 .  LINZESS 145 MCG CAPS capsule, TK ONE C PO AT LEAST 30 MINUTES BEFORE THE FIRST MEAL OF THE DAY ON A EMPTY STOMACH, Disp: , Rfl:  .  lisinopril (ZESTRIL) 10 MG tablet, Take 1 tablet (10 mg total) by mouth daily., Disp: 90 tablet, Rfl: 0 .  Magnesium 200 MG TABS, Take one tablet by mouth with evening meal.,  Disp: 30 tablet, Rfl: 2 .  meloxicam (MOBIC) 15 MG tablet, Take 1 tablet (15 mg total) by mouth daily., Disp: 90 tablet, Rfl: 0 .  metFORMIN (GLUCOPHAGE) 500 MG tablet, One qd with meals( dose reduced due to worsening creatinine), Disp: 1 tablet, Rfl: 0 .  NOVOLOG FLEXPEN 100 UNIT/ML FlexPen, ADMINISTER 18 UNITS UNDER THE SKIN THREE TIMES DAILY AS NEEDED FOR HIGH BLOOD SUGAR, Disp: 15 mL, Rfl: 0 .  omeprazole (PRILOSEC) 40 MG capsule, TAKE 1 CAPSULE(40 MG) BY MOUTH DAILY BEFORE BREAKFAST, Disp: 90 capsule, Rfl: 0 .  ondansetron (ZOFRAN) 4 MG tablet, Take 1 tablet (4 mg total) by mouth daily as needed for nausea or vomiting. From Ozempic, Disp: 30 tablet, Rfl: 1 .  promethazine (PHENERGAN) 25 MG tablet, TK 1 T PO Q 8 H PRF NAUSEA OR VOM, Disp: , Rfl:  .  promethazine (PHENERGAN) 25 MG tablet, Take 1 tablet (25 mg total) by mouth every 8 (eight) hours as needed for nausea or vomiting., Disp: 20 tablet, Rfl: 0 .  Semaglutide,0.25 or 0.5MG/DOS, (OZEMPIC, 0.25 OR 0.5 MG/DOSE,) 2 MG/1.5ML SOPN, Inject 0.5 mg into the skin once a week., Disp: 3 pen, Rfl: 1 .  spironolactone (ALDACTONE) 50 MG tablet, Take 1 tablet (50 mg total) by mouth daily., Disp: 90 tablet, Rfl: 0 .  traZODone (DESYREL) 150 MG tablet, TK 1 T PO HS, Disp: , Rfl:    No Known Allergies   Review of Systems  Constitutional: Negative.   Respiratory: Negative.   Cardiovascular: Negative for chest pain, palpitations and leg swelling.  Neurological: Positive for tingling and numbness. Negative for dizziness.  Psychiatric/Behavioral: Negative.      Today's Vitals   10/24/19 1208  BP: 132/80  Pulse: 72  Temp: 98.2 F (36.8 C)  TempSrc: Oral  Weight: 218 lb 6.4 oz (99.1 kg)  Height: 4' 11.8" (1.519 m)  PainSc: 8   PainLoc: Hand   Body mass index is 42.94 kg/m.   Objective:  Physical Exam Constitutional:      Appearance: Normal appearance.  Cardiovascular:     Rate and Rhythm: Normal rate and regular rhythm.     Pulses:  Normal pulses.     Heart sounds: Normal heart sounds. No murmur.  Pulmonary:     Effort: Pulmonary effort is normal. No respiratory distress.  Breath sounds: Normal breath sounds.  Skin:    Capillary Refill: Capillary refill takes less than 2 seconds.  Neurological:     General: No focal deficit present.     Mental Status: She is alert and oriented to person, place, and time.     Cranial Nerves: No cranial nerve deficit.         Assessment And Plan:     1. Right wrist pain  Mild swelling to right hand  Negative phalen, positive tinel's  Will treat with toradol, this could be an arthritis flare vs carpal tunnel  2. Right hand pain  See #1 - ketorolac (TORADOL) injection 60 mg  3. Paresthesias in right hand  Positive tinel's, prescription given for wrist splint if not better will refer to hand specialist - ketorolac (TORADOL) injection 60 mg  4. Uncontrolled type 2 diabetes mellitus with hyperglycemia (HCC)  Chronic, stable  She is to continue with ozempic  5. Chronic hepatitis C without hepatic coma ([place])  She has been treated and continues to be followed at the liver clinic   [name], FNP    THE PATIENT IS ENCOURAGED TO PRACTICE SOCIAL DISTANCING DUE TO THE COVID-19 PANDEMIC.

## 2019-10-25 NOTE — Progress Notes (Signed)
Patient was seen by Hadley Pen, [name] for a dressing changed. I did not see the patient. Incision was healing well without issues so declined an appointment by myself. Follow up as scheduled.

## 2019-10-31 ENCOUNTER — Telehealth: Payer: Self-pay | Admitting: Podiatry

## 2019-10-31 NOTE — Telephone Encounter (Signed)
Please advise. Kara Kelley 

## 2019-10-31 NOTE — Telephone Encounter (Signed)
I informed pt of Dr. Gabriel Rung 10/31/2019 4:27pm orders.

## 2019-10-31 NOTE — Telephone Encounter (Signed)
Yes, as long as the incision is healed. Dry thoroughly after and apply a small amount of antibiotic ointment

## 2019-10-31 NOTE — Telephone Encounter (Signed)
Pt called wanted to know if she could take a shoer since Dr .Ardelle Anton removed the stitches 10/22/19 pt stated that she was told that she could call and ask for lisa

## 2019-11-01 ENCOUNTER — Telehealth: Payer: Self-pay

## 2019-11-04 ENCOUNTER — Other Ambulatory Visit: Payer: Self-pay | Admitting: Nurse Practitioner

## 2019-11-04 DIAGNOSIS — I10 Essential (primary) hypertension: Secondary | ICD-10-CM

## 2019-11-07 ENCOUNTER — Ambulatory Visit (INDEPENDENT_AMBULATORY_CARE_PROVIDER_SITE_OTHER): Payer: Medicaid Other

## 2019-11-07 ENCOUNTER — Ambulatory Visit (INDEPENDENT_AMBULATORY_CARE_PROVIDER_SITE_OTHER): Payer: Medicaid Other | Admitting: Podiatry

## 2019-11-07 ENCOUNTER — Other Ambulatory Visit: Payer: Self-pay

## 2019-11-07 ENCOUNTER — Encounter: Payer: Self-pay | Admitting: Podiatry

## 2019-11-07 DIAGNOSIS — M216X2 Other acquired deformities of left foot: Secondary | ICD-10-CM | POA: Diagnosis not present

## 2019-11-07 DIAGNOSIS — Q828 Other specified congenital malformations of skin: Secondary | ICD-10-CM

## 2019-11-08 NOTE — Progress Notes (Signed)
Subjective: Kara Kelley is a 64 y.o. is seen today in office s/p left foot 5th metatarsal head resection.  She states that she is doing well insert does not cause any problems.  I did refill the pain medication for her but she states that she does not need it and she has not been taking this.  Chronic concerns show that the callus trimmed on the big toe today.  She still wearing surgical shoe and keeping the dressing on the surgical site but she states the incision is well-healed she has not had any swelling or redness or any drainage. Denies any systemic complaints such as fevers, chills, nausea, vomiting. No calf pain, chest pain, shortness of breath.   Objective: General: No acute distress, AAOx3  DP/PT pulses palpable 2/4, CRT < 3 sec to all digits.  Protective sensation intact. Motor function intact.  LEFT foot: Incision is well coapted without any evidence of dehiscence and the scar is well formed.  There is no significant pain of the surgical site.  There is minimal hyperkeratotic tissue submetatarsal 5 on the left foot as well as the medial hallux.  Upon debridement no underlying ulceration, drainage or any signs of infection are noted today.  No open lesions otherwise.  No other open lesions or pre-ulcerative lesions.  No pain with calf compression, swelling, warmth, erythema.   Assessment and Plan:  Status post left foot fifth metatarsal head excision, doing well  -Treatment options discussed including all alternatives, risks, and complications -To surgical standpoint she is doing well she is not having any issues.  I did debride the hyperkeratotic lesions on the left foot submetatarsal 5 as well as the hallux today without any complications or bleeding.  Discussed with her transition back to regular shoe.  Moisturizer to the foot. -Monitor for any clinical signs or symptoms of infection and directed to call the office immediately should any occur or go to the ER.  Return in about 3  weeks (around 11/28/2019).  Would like nails trimmed next appointment as well.  Vivi Barrack DPM

## 2019-11-16 ENCOUNTER — Other Ambulatory Visit: Payer: Self-pay | Admitting: Nurse Practitioner

## 2019-11-28 ENCOUNTER — Encounter: Payer: Medicaid Other | Admitting: Podiatry

## 2019-12-02 ENCOUNTER — Encounter: Payer: Medicaid Other | Admitting: Podiatry

## 2019-12-05 ENCOUNTER — Other Ambulatory Visit: Payer: Self-pay | Admitting: Nurse Practitioner

## 2019-12-10 LAB — HM DIABETES EYE EXAM

## 2019-12-19 ENCOUNTER — Ambulatory Visit: Payer: Medicaid Other | Admitting: Nurse Practitioner

## 2019-12-19 ENCOUNTER — Other Ambulatory Visit: Payer: Self-pay

## 2019-12-19 ENCOUNTER — Encounter: Payer: Self-pay | Admitting: Nurse Practitioner

## 2019-12-19 VITALS — BP 130/86 | HR 71 | Temp 98.1°F | Ht 59.8 in | Wt 218.6 lb

## 2019-12-19 DIAGNOSIS — R519 Headache, unspecified: Secondary | ICD-10-CM

## 2019-12-19 DIAGNOSIS — N644 Mastodynia: Secondary | ICD-10-CM

## 2019-12-19 DIAGNOSIS — N1832 Chronic kidney disease, stage 3b: Secondary | ICD-10-CM | POA: Diagnosis not present

## 2019-12-19 DIAGNOSIS — E1122 Type 2 diabetes mellitus with diabetic chronic kidney disease: Secondary | ICD-10-CM

## 2019-12-19 DIAGNOSIS — I10 Essential (primary) hypertension: Secondary | ICD-10-CM

## 2019-12-19 DIAGNOSIS — E1121 Type 2 diabetes mellitus with diabetic nephropathy: Secondary | ICD-10-CM

## 2019-12-19 DIAGNOSIS — I129 Hypertensive chronic kidney disease with stage 1 through stage 4 chronic kidney disease, or unspecified chronic kidney disease: Secondary | ICD-10-CM

## 2019-12-19 DIAGNOSIS — M62838 Other muscle spasm: Secondary | ICD-10-CM

## 2019-12-19 MED ORDER — LIRAGLUTIDE 18 MG/3ML ~~LOC~~ SOPN: 1.8000 mg | PEN_INJECTOR | Freq: Every day | SUBCUTANEOUS | 1 refills | Status: DC

## 2019-12-19 MED ORDER — CYCLOBENZAPRINE HCL 5 MG PO TABS: 5.0000 mg | ORAL_TABLET | Freq: Three times a day (TID) | ORAL | 1 refills | Status: DC | PRN

## 2019-12-19 NOTE — Progress Notes (Signed)
This visit occurred during the SARS-CoV-2 public health emergency.  Safety protocols were in place, including screening questions prior to the visit, additional usage of staff PPE, and extensive cleaning of exam room while observing appropriate contact time as indicated for disinfecting solutions.  Subjective:     Patient ID: Kara Kelley , female    DOB: 07/26/1956 , 64 y.o.   MRN: [id number]   Chief Complaint  Patient presents with  . Breast Pain    patient stated her right breast has been hurting for the past month and she tried to get a mammogram done but she needed to see [place] first   . Headache    HPI  Every now and then she will have a sharp pain to her right breast.  She reports as occurring for a while.  She has not received the Covid vaccine  Headache  This is a chronic problem. The current episode started more than 1 year ago. The problem has been unchanged. The pain is located in the occipital region. The pain radiates to the left neck and right neck. Pertinent negatives include no abdominal pain, coughing or dizziness. Nothing aggravates the symptoms. She has tried acetaminophen for the symptoms. The treatment provided mild relief. Her past medical history is significant for hypertension.  Diabetes She presents for her follow-up diabetic visit. She has type 2 diabetes mellitus. Her disease course has been stable. Hypoglycemia symptoms include headaches. Pertinent negatives for hypoglycemia include no dizziness or nervousness/anxiousness. There are no diabetic associated symptoms. Pertinent negatives for diabetes include no chest pain, no polydipsia, no polyphagia and no polyuria. There are no hypoglycemic complications. There are no diabetic complications. Risk factors for coronary artery disease include diabetes mellitus, obesity and sedentary lifestyle. Current diabetic treatment includes oral agent (dual therapy). She is compliant with treatment most of the time. She is  following a generally unhealthy diet. When asked about meal planning, she reported none. She has not had a previous visit with a dietitian. She rarely participates in exercise. (Blood sugar 80- 200s (once) after eating pizza.) An ACE inhibitor/angiotensin II receptor blocker is being taken. She does not see a podiatrist.Eye exam is current (she went to [place] [facility] last week and will be picking up new glasses).  Hypertension This is a chronic problem. The current episode started more than 1 year ago. The problem has been gradually improving since onset. The problem is controlled. Associated symptoms include headaches. Pertinent negatives include no chest pain or palpitations. Risk factors for coronary artery disease include diabetes mellitus, dyslipidemia, obesity, sedentary lifestyle and post-menopausal state. Past treatments include ACE inhibitors and diuretics. Compliance problems include exercise.  Hypertensive end-organ damage includes kidney disease. There is no history of angina. Identifiable causes of hypertension include chronic renal disease.     Past Medical History:  Diagnosis Date  . Anemia   . Anxiety   . Arthritis   . Asthma   . Bunion   . Callus   . Chronic pain   . Cocaine abuse ([place])   . COPD (chronic obstructive pulmonary disease) ([place])   . Corns and callosities   . Degenerative joint disease   . Depression   . Diabetes mellitus   . Endometrial polyp   . ETOH abuse   . Gall stones   . GERD (gastroesophageal reflux disease)   . Headache    history of Migraines  . Hepatitis C    Hep C  . Hyperlipidemia   . Hypertension   .  Insomnia   . Spondylolisthesis of lumbar region   . Substance abuse ([place])    alcoholism  . Tuberculosis 1985  . Wears dentures   . Wears glasses      Family History  Problem Relation Age of Onset  . Heart disease Mother   . Diabetes Other        mat great aunt  . Cirrhosis Other        mat great aunt  . Cirrhosis Other        mat  great uncles x 2  . Colon cancer Neg Hx   . Breast cancer Neg Hx      Current Outpatient Medications:  .  Accu-Chek FastClix Lancets MISC, USE TO CHECK BLOOD GLUCOSE THREE TIMES DAILY, Disp: 102 each, Rfl: prn .  albuterol (PROVENTIL HFA;VENTOLIN HFA) 108 (90 Base) MCG/ACT inhaler, Inhale 2 puffs into the lungs every 6 (six) hours as needed. For shortness of breath., Disp: 18 g, Rfl: 1 .  Blood Glucose Monitoring Suppl (ACCU-CHEK NANO SMARTVIEW) w/Device KIT, 1 each by Does not apply route 3 (three) times daily., Disp: 1 kit, Rfl: 0 .  buPROPion (WELLBUTRIN XL) 300 MG 24 hr tablet, Take 300 mg by mouth every morning., Disp: , Rfl: 0 .  cyclobenzaprine (FLEXERIL) 5 MG tablet, TK 1 T PO Q 12 H, Disp: , Rfl:  .  glucose blood test strip, Use as instructed, Disp: 300 each, Rfl: 3 .  Insulin Pen Needle (PEN NEEDLES) 32G X 4 MM MISC, 1 each by Does not apply route 3 (three) times daily., Disp: 200 each, Rfl: 3 .  LEVEMIR FLEXTOUCH 100 UNIT/ML Pen, INJECT 40 UNITS INTO SKIN AT BEDTIME, Disp: 15 mL, Rfl: 1 .  LINZESS 145 MCG CAPS capsule, TK ONE C PO AT LEAST 30 MINUTES BEFORE THE FIRST MEAL OF THE DAY ON A EMPTY STOMACH, Disp: , Rfl:  .  lisinopril (ZESTRIL) 10 MG tablet, Take 1 tablet (10 mg total) by mouth daily., Disp: 90 tablet, Rfl: 0 .  meloxicam (MOBIC) 15 MG tablet, Take 1 tablet (15 mg total) by mouth daily., Disp: 90 tablet, Rfl: 0 .  NOVOLOG FLEXPEN 100 UNIT/ML FlexPen, ADMINISTER 18 UNITS UNDER THE SKIN THREE TIMES DAILY AS NEEDED FOR HIGH BLOOD SUGAR, Disp: 15 mL, Rfl: 0 .  omeprazole (PRILOSEC) 40 MG capsule, TAKE 1 CAPSULE(40 MG) BY MOUTH DAILY BEFORE BREAKFAST, Disp: 90 capsule, Rfl: 0 .  spironolactone (ALDACTONE) 50 MG tablet, TAKE 1 TABLET(50 MG) BY MOUTH DAILY, Disp: 90 tablet, Rfl: 0 .  traZODone (DESYREL) 150 MG tablet, TK 1 T PO HS, Disp: , Rfl:  .  ARIPiprazole (ABILIFY) 10 MG tablet, Take 10 mg by mouth daily. , Disp: , Rfl:  .  atorvastatin (LIPITOR) 10 MG tablet, Take 1  tablet (10 mg total) by mouth daily. (Patient not taking: Reported on 12/19/2019), Disp: 90 tablet, Rfl: 0 .  baclofen (LIORESAL) 10 MG tablet, Take 1 tablet (10 mg total) by mouth 3 (three) times daily as needed for muscle spasms. (Patient not taking: Reported on 12/19/2019), Disp: 30 tablet, Rfl: 1 .  CIPRODEX OTIC suspension, SHAKE LQ AND INT 4 GTS IN AU BID X 7 DAYS, Disp: , Rfl:  .  doxycycline (VIBRA-TABS) 100 MG tablet, Take 1 tablet (100 mg total) by mouth 2 (two) times daily. (Patient not taking: Reported on 12/19/2019), Disp: 14 tablet, Rfl: 0 .  HYDROcodone-acetaminophen (NORCO/VICODIN) 5-325 MG tablet, Take 1 tablet by mouth every 6 (six) hours as needed. (  Patient not taking: Reported on 12/19/2019), Disp: 15 tablet, Rfl: 0 .  Magnesium 200 MG TABS, Take one tablet by mouth with evening meal. (Patient not taking: Reported on 12/19/2019), Disp: 30 tablet, Rfl: 2 .  metFORMIN (GLUCOPHAGE) 500 MG tablet, One qd with meals( dose reduced due to worsening creatinine) (Patient not taking: Reported on 12/19/2019), Disp: 1 tablet, Rfl: 0 .  ondansetron (ZOFRAN) 4 MG tablet, Take 1 tablet (4 mg total) by mouth daily as needed for nausea or vomiting. From Ozempic (Patient not taking: Reported on 12/19/2019), Disp: 30 tablet, Rfl: 1 .  promethazine (PHENERGAN) 25 MG tablet, TK 1 T PO Q 8 H PRF NAUSEA OR VOM, Disp: , Rfl:  .  promethazine (PHENERGAN) 25 MG tablet, Take 1 tablet (25 mg total) by mouth every 8 (eight) hours as needed for nausea or vomiting. (Patient not taking: Reported on 12/19/2019), Disp: 20 tablet, Rfl: 0 .  Semaglutide,0.25 or 0.5MG/DOS, (OZEMPIC, 0.25 OR 0.5 MG/DOSE,) 2 MG/1.5ML SOPN, Inject 0.5 mg into the skin once a week. (Patient not taking: Reported on 12/19/2019), Disp: 3 pen, Rfl: 1   No Known Allergies   Review of Systems  Constitutional: Negative.   Respiratory: Negative.  Negative for cough.   Cardiovascular: Negative.  Negative for chest pain, palpitations and leg swelling.   Gastrointestinal: Negative for abdominal pain.  Endocrine: Negative for polydipsia, polyphagia and polyuria.  Genitourinary:       Right intermittent breast pain  Musculoskeletal: Negative.   Skin: Negative.   Neurological: Positive for headaches. Negative for dizziness.  Psychiatric/Behavioral: Negative.  The patient is not nervous/anxious.      Today's Vitals   12/19/19 0837  BP: 130/86  Pulse: 71  Temp: 98.1 F (36.7 C)  TempSrc: Oral  Weight: 218 lb 9.6 oz (99.2 kg)  Height: 4' 11.8" (1.519 m)  PainSc: 8    Body mass index is 42.98 kg/m.   Objective:  Physical Exam Constitutional:      Appearance: She is well-developed.  Cardiovascular:     Rate and Rhythm: Normal rate and regular rhythm.  Pulmonary:     Effort: Pulmonary effort is normal. No respiratory distress.     Breath sounds: Normal breath sounds.  Chest:     Breasts:        Right: Tenderness (tenderness at 2 o'clock) present. No mass.   Musculoskeletal:        General: Tenderness (right neck area is tense) present.  Skin:    General: Skin is warm and dry.  Neurological:     Mental Status: She is alert and oriented to person, place, and time.  Psychiatric:        Mood and Affect: Mood normal. Mood is not anxious.        Speech: Speech normal.        Behavior: Behavior normal.         Assessment And Plan:     1. Type 2 diabetes mellitus with stage 3a chronic kidney disease, without long-term current use of insulin (HCC)  Chronic, stable  She is interested in starting Victoza due to her insurance not covering ozempic. Instructions given to her on how to take the medication  2. Essential hypertension . B/P is controlled.  . CMP ordered to check renal function.  . The importance of regular exercise and dietary modification was stressed to the patient.  . Stressed importance of losing ten percent of her body weight to help with B/P control.  . The  weight loss would help with decreasing cardiac  and cancer risk as well.   3. Breast pain, right  Tenderness to right breast at 2 o'clock - MM Digital Diagnostic Unilat R; Future  4. Acute nonintractable headache, unspecified headache type  Will provide with muscle relaxer as she has tightness to her right neck which seems to be tension related. - cyclobenzaprine (FLEXERIL) 5 MG tablet; Take 1 tablet (5 mg total) by mouth 3 (three) times daily as needed for muscle spasms.  Dispense: 30 tablet; Refill: 1  5. Muscle spasm  Encouraged to stay well hydrated with water and take as needed cyclobenzaprine - cyclobenzaprine (FLEXERIL) 5 MG tablet; Take 1 tablet (5 mg total) by mouth 3 (three) times daily as needed for muscle spasms.  Dispense: 30 tablet; Refill: 1   [name], FNP    THE PATIENT IS ENCOURAGED TO PRACTICE SOCIAL DISTANCING DUE TO THE COVID-19 PANDEMIC.

## 2019-12-20 ENCOUNTER — Other Ambulatory Visit: Payer: Self-pay | Admitting: Nurse Practitioner

## 2019-12-20 DIAGNOSIS — N644 Mastodynia: Secondary | ICD-10-CM

## 2019-12-20 LAB — CMP14+EGFR
ALT: 11 IU/L (ref 0–32)
AST: 17 IU/L (ref 0–40)
Albumin/Globulin Ratio: 1.6 (ref 1.2–2.2)
Albumin: 4.3 g/dL (ref 3.8–4.8)
Alkaline Phosphatase: 123 IU/L — ABNORMAL HIGH (ref 39–117)
BUN/Creatinine Ratio: 12 (ref 12–28)
BUN: 12 mg/dL (ref 8–27)
Bilirubin Total: 0.2 mg/dL (ref 0.0–1.2)
CO2: 22 mmol/L (ref 20–29)
Calcium: 9.5 mg/dL (ref 8.7–10.3)
Chloride: 109 mmol/L — ABNORMAL HIGH (ref 96–106)
Creatinine, Ser: 1.04 mg/dL — ABNORMAL HIGH (ref 0.57–1.00)
GFR calc Af Amer: 67 mL/min/{1.73_m2} (ref >59)
GFR calc non Af Amer: 58 mL/min/{1.73_m2} — ABNORMAL LOW (ref >59)
Globulin, Total: 2.7 g/dL (ref 1.5–4.5)
Glucose: 51 mg/dL — ABNORMAL LOW (ref 65–99)
Potassium: 3.7 mmol/L (ref 3.5–5.2)
Sodium: 145 mmol/L — ABNORMAL HIGH (ref 134–144)
Total Protein: 7 g/dL (ref 6.0–8.5)

## 2019-12-20 LAB — HEMOGLOBIN A1C
Est. average glucose Bld gHb Est-mCnc: 157 mg/dL
Hgb A1c MFr Bld: 7.1 % — ABNORMAL HIGH (ref 4.8–5.6)

## 2019-12-20 LAB — LIPID PANEL
Chol/HDL Ratio: 2.2 ratio (ref 0.0–4.4)
Cholesterol, Total: 110 mg/dL (ref 100–199)
HDL: 49 mg/dL (ref >39)
LDL Chol Calc (NIH): 46 mg/dL (ref 0–99)
Triglycerides: 71 mg/dL (ref 0–149)
VLDL Cholesterol Cal: 15 mg/dL (ref 5–40)

## 2019-12-26 ENCOUNTER — Other Ambulatory Visit: Payer: Self-pay | Admitting: Nurse Practitioner

## 2019-12-26 ENCOUNTER — Other Ambulatory Visit: Payer: Self-pay

## 2019-12-26 DIAGNOSIS — K219 Gastro-esophageal reflux disease without esophagitis: Secondary | ICD-10-CM

## 2019-12-26 MED ORDER — ATORVASTATIN CALCIUM 10 MG PO TABS: 10.0000 mg | ORAL_TABLET | Freq: Every day | ORAL | 0 refills | Status: DC

## 2019-12-30 ENCOUNTER — Other Ambulatory Visit: Payer: Self-pay | Admitting: Nurse Practitioner

## 2019-12-30 ENCOUNTER — Ambulatory Visit: Payer: Medicaid Other | Attending: Internal Medicine

## 2019-12-30 DIAGNOSIS — I10 Essential (primary) hypertension: Secondary | ICD-10-CM

## 2019-12-30 DIAGNOSIS — Z794 Long term (current) use of insulin: Secondary | ICD-10-CM

## 2019-12-30 DIAGNOSIS — Z23 Encounter for immunization: Secondary | ICD-10-CM

## 2019-12-30 DIAGNOSIS — E11649 Type 2 diabetes mellitus with hypoglycemia without coma: Secondary | ICD-10-CM

## 2019-12-30 NOTE — Progress Notes (Signed)
   Covid-19 Vaccination Clinic  Name:  SHAMIRA TOUTANT    MRN: [id number] DOB: 1956/10/05  12/30/2019  Ms. Bartl was observed post Covid-19 immunization for 15 minutes without incident. She was provided with Vaccine Information Sheet and instruction to access the V-Safe system.   Ms. Walpole was instructed to call 911 with any severe reactions post vaccine: [name] Difficulty breathing  . Swelling of face and throat  . A fast heartbeat  . A bad rash all over body  . Dizziness and weakness   Immunizations Administered    Name Date Dose VIS Date Route   Pfizer COVID-19 Vaccine 12/30/2019  8:18 AM 0.3 mL 09/20/2019 Intramuscular   Manufacturer: [employer], [employer]   Lot: [id number]   NDC: [address]-4

## 2020-01-02 ENCOUNTER — Ambulatory Visit: Payer: Medicaid Other | Admitting: Podiatry

## 2020-01-02 ENCOUNTER — Other Ambulatory Visit: Payer: Self-pay

## 2020-01-02 ENCOUNTER — Ambulatory Visit (INDEPENDENT_AMBULATORY_CARE_PROVIDER_SITE_OTHER): Payer: Medicaid Other

## 2020-01-02 DIAGNOSIS — M79674 Pain in right toe(s): Secondary | ICD-10-CM

## 2020-01-02 DIAGNOSIS — M79675 Pain in left toe(s): Secondary | ICD-10-CM | POA: Diagnosis not present

## 2020-01-02 DIAGNOSIS — M216X2 Other acquired deformities of left foot: Secondary | ICD-10-CM | POA: Diagnosis not present

## 2020-01-02 DIAGNOSIS — B351 Tinea unguium: Secondary | ICD-10-CM | POA: Diagnosis not present

## 2020-01-02 DIAGNOSIS — Q828 Other specified congenital malformations of skin: Secondary | ICD-10-CM

## 2020-01-02 DIAGNOSIS — E119 Type 2 diabetes mellitus without complications: Secondary | ICD-10-CM

## 2020-01-05 ENCOUNTER — Other Ambulatory Visit: Payer: Self-pay | Admitting: Nurse Practitioner

## 2020-01-05 NOTE — Progress Notes (Signed)
Subjective: 65 y.o. returns the office today for painful, elongated, thickened toenails which she cannot trim herself as well as for calluses to her feet.  She states that she has no other concerns and surgeries has been doing very well and she has no issues.  She is wearing regular shoe.  No swelling or redness and the scar is well formed.  Denies any systemic complaints such as fevers, chills, nausea, vomiting.   PCP: Arnette Felts, FNP   Objective: AAO 3, NAD DP/PT pulses palpable, CRT less than 3 seconds Nails hypertrophic, dystrophic, elongated, brittle, discolored 10. There is tenderness overlying the nails 1-5 bilaterally. There is no surrounding erythema or drainage along the nail sites. Hyperkeratotic lesion left medial hallux and minimally submetatarsal 5.  No ongoing ulceration drainage or signs of infection. Incision from the prior surgery well-healed.  There is no edema, erythema or any pain to the surgical site.   No open lesions or pre-ulcerative lesions are identified. No other areas of tenderness bilateral lower extremities. No overlying edema, erythema, increased warmth. No pain with calf compression, swelling, warmth, erythema.  Assessment: Patient presents with symptomatic onychomycosis  Plan: -Treatment options including alternatives, risks, complications were discussed -X-rays obtained reviewed.  No evidence of acute fracture.  Status post fifth metatarsal head excision. -Nails sharply debrided 10 without complication/bleeding. -Hyperkeratotic lesion subdebrided x2 without complications or bleeding.  Recommend moisturizer daily. -From a surgical standpoint she is doing well she is having no pain.  We will discharge her from the postoperative care.  She agrees with this plan has no further questions. -Discussed daily foot inspection. If there are any changes, to call the office immediately.  -Follow-up in 3 months or sooner if any problems are to arise. In the  meantime, encouraged to call the office with any questions, concerns, changes symptoms.  Ovid Curd, DPM

## 2020-01-06 ENCOUNTER — Other Ambulatory Visit: Payer: Self-pay | Admitting: Nurse Practitioner

## 2020-01-06 DIAGNOSIS — I10 Essential (primary) hypertension: Secondary | ICD-10-CM

## 2020-01-08 ENCOUNTER — Ambulatory Visit
Admission: RE | Admit: 2020-01-08 | Discharge: 2020-01-08 | Disposition: A | Discharge status: Home / Self Care | Payer: Medicaid Other | Source: Ambulatory Visit | Attending: Nurse Practitioner | Admitting: Nurse Practitioner

## 2020-01-08 ENCOUNTER — Other Ambulatory Visit: Payer: Self-pay

## 2020-01-08 DIAGNOSIS — N644 Mastodynia: Secondary | ICD-10-CM

## 2020-01-22 ENCOUNTER — Ambulatory Visit: Payer: Medicaid Other | Attending: Internal Medicine

## 2020-01-22 DIAGNOSIS — Z23 Encounter for immunization: Secondary | ICD-10-CM

## 2020-01-22 NOTE — Progress Notes (Signed)
   Covid-19 Vaccination Clinic  Name:  Kara Kelley    MRN: [id number] DOB: 1955-10-28  01/22/2020  Kara Kelley was observed post Covid-19 immunization for 15 minutes without incident. She was provided with Vaccine Information Sheet and instruction to access the V-Safe system.   Kara Kelley was instructed to call 911 with any severe reactions post vaccine: [name] Difficulty breathing  . Swelling of face and throat  . A fast heartbeat  . A bad rash all over body  . Dizziness and weakness   Immunizations Administered    Name Date Dose VIS Date Route   Pfizer COVID-19 Vaccine 01/22/2020  9:37 AM 0.3 mL 09/20/2019 Intramuscular   Manufacturer: [employer], [employer]   Lot: [id number]   NDC: [address]-3

## 2020-01-27 ENCOUNTER — Ambulatory Visit: Payer: Self-pay | Admitting: Nurse Practitioner

## 2020-02-18 ENCOUNTER — Other Ambulatory Visit: Payer: Self-pay | Admitting: Neurosurgery

## 2020-02-18 DIAGNOSIS — M5416 Radiculopathy, lumbar region: Secondary | ICD-10-CM

## 2020-03-03 ENCOUNTER — Ambulatory Visit
Admission: RE | Admit: 2020-03-03 | Discharge: 2020-03-03 | Disposition: A | Discharge status: Home / Self Care | Payer: Medicaid Other | Source: Ambulatory Visit | Attending: Neurosurgery | Admitting: Neurosurgery

## 2020-03-03 ENCOUNTER — Other Ambulatory Visit: Payer: Self-pay

## 2020-03-03 DIAGNOSIS — M5416 Radiculopathy, lumbar region: Secondary | ICD-10-CM

## 2020-03-07 ENCOUNTER — Emergency Department (HOSPITAL_COMMUNITY)
Admission: EM | Admit: 2020-03-07 | Discharge: 2020-03-07 | Disposition: A | Discharge status: Home / Self Care | Payer: Medicaid Other | Attending: Emergency Medicine | Admitting: Emergency Medicine

## 2020-03-07 ENCOUNTER — Encounter (HOSPITAL_COMMUNITY): Payer: Self-pay

## 2020-03-07 ENCOUNTER — Emergency Department (HOSPITAL_COMMUNITY): Payer: Medicaid Other

## 2020-03-07 ENCOUNTER — Other Ambulatory Visit: Payer: Self-pay

## 2020-03-07 DIAGNOSIS — J45909 Unspecified asthma, uncomplicated: Secondary | ICD-10-CM | POA: Diagnosis not present

## 2020-03-07 DIAGNOSIS — Y999 Unspecified external cause status: Secondary | ICD-10-CM | POA: Insufficient documentation

## 2020-03-07 DIAGNOSIS — M545 Low back pain: Secondary | ICD-10-CM | POA: Diagnosis not present

## 2020-03-07 DIAGNOSIS — Z79899 Other long term (current) drug therapy: Secondary | ICD-10-CM | POA: Insufficient documentation

## 2020-03-07 DIAGNOSIS — Z87891 Personal history of nicotine dependence: Secondary | ICD-10-CM | POA: Insufficient documentation

## 2020-03-07 DIAGNOSIS — W19XXXA Unspecified fall, initial encounter: Secondary | ICD-10-CM

## 2020-03-07 DIAGNOSIS — M25551 Pain in right hip: Secondary | ICD-10-CM | POA: Insufficient documentation

## 2020-03-07 DIAGNOSIS — Z794 Long term (current) use of insulin: Secondary | ICD-10-CM | POA: Diagnosis not present

## 2020-03-07 DIAGNOSIS — J449 Chronic obstructive pulmonary disease, unspecified: Secondary | ICD-10-CM | POA: Insufficient documentation

## 2020-03-07 DIAGNOSIS — E119 Type 2 diabetes mellitus without complications: Secondary | ICD-10-CM | POA: Diagnosis not present

## 2020-03-07 DIAGNOSIS — W010XXA Fall on same level from slipping, tripping and stumbling without subsequent striking against object, initial encounter: Secondary | ICD-10-CM | POA: Insufficient documentation

## 2020-03-07 DIAGNOSIS — Y9301 Activity, walking, marching and hiking: Secondary | ICD-10-CM | POA: Insufficient documentation

## 2020-03-07 MED ORDER — LIDOCAINE 5 % EX PTCH: 1.0000 | MEDICATED_PATCH | CUTANEOUS | 0 refills | Status: DC

## 2020-03-07 MED ORDER — ACETAMINOPHEN 325 MG PO TABS
650.0000 mg | ORAL_TABLET | Freq: Once | ORAL | Status: AC
  Administered 2020-03-07: 650 mg via ORAL
  Filled 2020-03-07: qty 2
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
## 2020-03-07 NOTE — Discharge Instructions (Signed)
You were seen today for fall.  Your x-rays are negative for fracture.  Your back pain should be treated with medicines such as ibuprofen or aleve and this back pain should get better over the next 2 weeks.  However if you develop severe or worsening pain, low back pain with fever, numbness, weakness or inability to walk or urinate, you should return to the ER immediately.  Please follow up with your doctor this week for a recheck if still having symptoms.  Medications are also useful to help with pain control.  A commonly prescribed medications includes acetaminophen.  This medication is generally safe, though you should not take more than 8 of the extra strength (500mg ) pills a day.  Non steroidal anti inflammatory medications including Ibuprofen and naproxen;  These medications help both pain and swelling and are very useful in treating back pain.  They should be taken with food, as they can cause stomach upset, and more seriously, stomach bleeding.    You will need to follow up with  Your primary healthcare provider in 1-2 weeks for reassessment.  Be aware that if you develop new symptoms, such as a fever, leg weakness, difficulty with or loss of control of your urine or bowels, abdominal pain, or more severe pain, you will need to seek medical attention and  / or return to the Emergency department.  If you do not have a doctor see the list below.

## 2020-03-07 NOTE — ED Triage Notes (Signed)
Pt presents with c/o fall that occurred last night. Pt reports she is hurting on her back and both of her legs. Pt denies any LOC with the fall and denies hitting her head.

## 2020-03-12 ENCOUNTER — Other Ambulatory Visit: Payer: Self-pay

## 2020-03-12 ENCOUNTER — Ambulatory Visit: Payer: Medicaid Other | Admitting: Nurse Practitioner

## 2020-03-12 VITALS — BP 130/80 | HR 82 | Temp 98.3°F | Ht 59.8 in | Wt 211.2 lb

## 2020-03-12 DIAGNOSIS — W19XXXD Unspecified fall, subsequent encounter: Secondary | ICD-10-CM

## 2020-03-12 DIAGNOSIS — E1121 Type 2 diabetes mellitus with diabetic nephropathy: Secondary | ICD-10-CM | POA: Diagnosis not present

## 2020-03-12 DIAGNOSIS — H9191 Unspecified hearing loss, right ear: Secondary | ICD-10-CM | POA: Insufficient documentation

## 2020-03-12 DIAGNOSIS — I10 Essential (primary) hypertension: Secondary | ICD-10-CM

## 2020-03-12 DIAGNOSIS — N1832 Chronic kidney disease, stage 3b: Secondary | ICD-10-CM

## 2020-03-12 DIAGNOSIS — M25542 Pain in joints of left hand: Secondary | ICD-10-CM

## 2020-03-12 DIAGNOSIS — E1122 Type 2 diabetes mellitus with diabetic chronic kidney disease: Secondary | ICD-10-CM

## 2020-03-12 DIAGNOSIS — M25541 Pain in joints of right hand: Secondary | ICD-10-CM

## 2020-03-12 NOTE — Progress Notes (Signed)
This visit occurred during the SARS-CoV-2 public health emergency.  Safety protocols were in place, including screening questions prior to the visit, additional usage of staff PPE, and extensive cleaning of exam room while observing appropriate contact time as indicated for disinfecting solutions.  Subjective:     Patient ID: Kara Kelley , female    DOB: [birth year] , 64 y.o.   MRN: [id number]   Chief Complaint  Patient presents with  . Diabetes  . Referral    ENT and f/u    HPI  She feels like she is unable to hear well.    Diabetes She presents for her follow-up diabetic visit. She has type 2 diabetes mellitus. There are no hypoglycemic associated symptoms. Pertinent negatives for hypoglycemia include no dizziness or headaches. There are no diabetic associated symptoms. Pertinent negatives for diabetes include no chest pain and no fatigue. There are no hypoglycemic complications. There are no diabetic complications. Risk factors for coronary artery disease include sedentary lifestyle and obesity. Current diabetic treatment includes oral agent (dual therapy). She has not had a previous visit with a dietitian. (This morning blood sugar was elevated) An ACE inhibitor/angiotensin II receptor blocker is being taken. She does not see a podiatrist.Eye exam is current.  Fall The accident occurred 3 to 5 days ago. Fall occurred: she hit her foot on the side of the bed while walking in the dark. She fell from a height of 3 to 5 ft. Point of impact: left side of her  Pain scale: soreness. The patient is experiencing no pain. Pertinent negatives include no headaches. She has tried nothing for the symptoms.     Past Medical History:  Diagnosis Date  . Anemia   . Anxiety   . Arthritis   . Asthma   . Bunion   . Callus   . Chronic pain   . Cocaine abuse ([place])   . COPD (chronic obstructive pulmonary disease) ([place])   . Corns and callosities   . Degenerative joint disease   . Depression   .  Diabetes mellitus   . Endometrial polyp   . ETOH abuse   . Gall stones   . GERD (gastroesophageal reflux disease)   . Headache    history of Migraines  . Hepatitis C    Hep C  . Hyperlipidemia   . Hypertension   . Insomnia   . Spondylolisthesis of lumbar region   . Substance abuse ([place])    alcoholism  . Tuberculosis 1985  . Wears dentures   . Wears glasses      Family History  Problem Relation Age of Onset  . Heart disease Mother   . Diabetes Other        mat great aunt  . Cirrhosis Other        mat great aunt  . Cirrhosis Other        mat great uncles x 2  . Colon cancer Neg Hx   . Breast cancer Neg Hx      Current Outpatient Medications:  .  Accu-Chek FastClix Lancets MISC, USE TO CHECK BLOOD GLUCOSE THREE TIMES DAILY, Disp: 102 each, Rfl: prn .  ACCU-CHEK GUIDE test strip, USE TO TEST FOUR TIMES DAILY, Disp: 300 strip, Rfl: 2 .  albuterol (PROVENTIL HFA;VENTOLIN HFA) 108 (90 Base) MCG/ACT inhaler, Inhale 2 puffs into the lungs every 6 (six) hours as needed. For shortness of breath., Disp: 18 g, Rfl: 1 .  ARIPiprazole (ABILIFY) 10 MG tablet, Take 10 mg  by mouth daily. , Disp: , Rfl:  .  atorvastatin (LIPITOR) 10 MG tablet, Take 1 tablet (10 mg total) by mouth daily., Disp: 90 tablet, Rfl: 0 .  Blood Glucose Monitoring Suppl (ACCU-CHEK NANO SMARTVIEW) w/Device KIT, 1 each by Does not apply route 3 (three) times daily., Disp: 1 kit, Rfl: 0 .  buPROPion (WELLBUTRIN XL) 300 MG 24 hr tablet, Take 300 mg by mouth every morning., Disp: , Rfl: 0 .  CIPRODEX OTIC suspension, SHAKE LQ AND INT 4 GTS IN AU BID X 7 DAYS, Disp: , Rfl:  .  cyclobenzaprine (FLEXERIL) 5 MG tablet, Take 1 tablet (5 mg total) by mouth 3 (three) times daily as needed for muscle spasms., Disp: 30 tablet, Rfl: 1 .  Insulin Pen Needle (PEN NEEDLES) 32G X 4 MM MISC, 1 each by Does not apply route 3 (three) times daily., Disp: 200 each, Rfl: 3 .  LEVEMIR FLEXTOUCH 100 UNIT/ML Pen, INJECT 40 UNITS INTO SKIN AT  BEDTIME, Disp: 15 mL, Rfl: 1 .  lidocaine (LIDODERM) 5 %, Place 1 patch onto the skin daily. Remove & Discard patch within 12 hours or as directed by MD, Disp: 30 patch, Rfl: 0 .  LINZESS 145 MCG CAPS capsule, TK ONE C PO AT LEAST 30 MINUTES BEFORE THE FIRST MEAL OF THE DAY ON A EMPTY STOMACH, Disp: , Rfl:  .  liraglutide (VICTOZA) 18 MG/3ML SOPN, Inject 0.3 mLs (1.8 mg total) into the skin daily., Disp: 9 pen, Rfl: 1 .  lisinopril (ZESTRIL) 10 MG tablet, TAKE 1 TABLET(10 MG) BY MOUTH DAILY, Disp: 90 tablet, Rfl: 0 .  meloxicam (MOBIC) 15 MG tablet, Take 1 tablet (15 mg total) by mouth daily., Disp: 90 tablet, Rfl: 0 .  metFORMIN (GLUCOPHAGE) 500 MG tablet, TAKE 1 TABLET(500 MG) BY MOUTH TWICE DAILY WITH A MEAL, Disp: 180 tablet, Rfl: 1 .  NOVOLOG FLEXPEN 100 UNIT/ML FlexPen, ADMINISTER 18 UNITS UNDER THE SKIN THREE TIMES DAILY AS NEEDED FOR HIGH BLOOD SUGAR, Disp: 15 mL, Rfl: 0 .  omeprazole (PRILOSEC) 40 MG capsule, TAKE 1 CAPSULE(40 MG) BY MOUTH DAILY BEFORE BREAKFAST, Disp: 90 capsule, Rfl: 0 .  promethazine (PHENERGAN) 25 MG tablet, TK 1 T PO Q 8 H PRF NAUSEA OR VOM, Disp: , Rfl:  .  spironolactone (ALDACTONE) 50 MG tablet, TAKE 1 TABLET(50 MG) BY MOUTH DAILY, Disp: 90 tablet, Rfl: 0 .  traZODone (DESYREL) 150 MG tablet, TK 1 T PO HS, Disp: , Rfl:  .  doxycycline (VIBRA-TABS) 100 MG tablet, Take 1 tablet (100 mg total) by mouth 2 (two) times daily. (Patient not taking: Reported on 12/19/2019), Disp: 14 tablet, Rfl: 0 .  HYDROcodone-acetaminophen (NORCO/VICODIN) 5-325 MG tablet, Take 1 tablet by mouth every 6 (six) hours as needed. (Patient not taking: Reported on 12/19/2019), Disp: 15 tablet, Rfl: 0 .  Magnesium 200 MG TABS, Take one tablet by mouth with evening meal. (Patient not taking: Reported on 12/19/2019), Disp: 30 tablet, Rfl: 2 .  promethazine (PHENERGAN) 25 MG tablet, Take 1 tablet (25 mg total) by mouth every 8 (eight) hours as needed for nausea or vomiting. (Patient not taking: Reported  on 12/19/2019), Disp: 20 tablet, Rfl: 0 .  Semaglutide,0.25 or 0.5MG/DOS, (OZEMPIC, 0.25 OR 0.5 MG/DOSE,) 2 MG/1.5ML SOPN, Inject 0.5 mg into the skin once a week. (Patient not taking: Reported on 12/19/2019), Disp: 3 pen, Rfl: 1   No Known Allergies   Review of Systems  Constitutional: Negative.  Negative for diaphoresis and fatigue.  Respiratory:  Negative.  Negative for cough.   Cardiovascular: Negative.  Negative for chest pain, palpitations and leg swelling.  Neurological: Negative for dizziness and headaches.  Psychiatric/Behavioral: Negative.      Today's Vitals   03/12/20 1113  BP: 130/80  Pulse: 82  Temp: 98.3 F (36.8 C)  TempSrc: Oral  Weight: 211 lb 3.2 oz (95.8 kg)  Height: 4' 11.8" (1.519 m)  PainSc: 8   PainLoc: Back   Body mass index is 41.52 kg/m.   Objective:  Physical Exam Vitals reviewed.  Constitutional:      Appearance: Normal appearance.  HENT:     Right Ear: Tympanic membrane, ear canal and external ear normal.     Left Ear: Hearing, tympanic membrane, ear canal and external ear normal.     Ears:     Right Rinne: AC > BC. Cardiovascular:     Rate and Rhythm: Normal rate and regular rhythm.     Pulses: Normal pulses.     Heart sounds: Normal heart sounds. No murmur.  Pulmonary:     Effort: Pulmonary effort is normal. No respiratory distress.     Breath sounds: Normal breath sounds.  Skin:    General: Skin is warm and dry.     Capillary Refill: Capillary refill takes less than 2 seconds.  Neurological:     General: No focal deficit present.     Mental Status: She is alert and oriented to person, place, and time.  Psychiatric:        Mood and Affect: Mood normal.        Behavior: Behavior normal.        Thought Content: Thought content normal.        Judgment: Judgment normal.         Assessment And Plan:     1. Type 2 diabetes mellitus with stage 3b chronic kidney disease, without long-term current use of insulin (HCC)  Chronic,    Will check HgbA1c - CMP14+EGFR - Hemoglobin A1c  2. Essential hypertension  Chronic, blood pressure is fairly controlled  Continue with current medications  3. Fall, subsequent encounter  She had a fall while walking in the dark in her bedroom, she is having right sided soreness to her hip. She was seen at the ER with xrays which were negative  4. Hearing loss of right ear, unspecified hearing loss type  AC greater than BC on right side  Will refer to ENT - Ambulatory referral to ENT  5. Arthralgia of both hands  Will check autoimmune panel  Encouraged to eat an autoimmune diet.  - Autoimmune Profile   [name], FNP    THE PATIENT IS ENCOURAGED TO PRACTICE SOCIAL DISTANCING DUE TO THE COVID-19 PANDEMIC.

## 2020-03-13 LAB — CMP14+EGFR
ALT: 28 IU/L (ref 0–32)
AST: 24 IU/L (ref 0–40)
Albumin/Globulin Ratio: 1.5 (ref 1.2–2.2)
Albumin: 4.3 g/dL (ref 3.8–4.8)
Alkaline Phosphatase: 135 IU/L — ABNORMAL HIGH (ref 48–121)
BUN/Creatinine Ratio: 13 (ref 12–28)
BUN: 14 mg/dL (ref 8–27)
Bilirubin Total: <0.2 mg/dL (ref 0.0–1.2)
CO2: 20 mmol/L (ref 20–29)
Calcium: 9.3 mg/dL (ref 8.7–10.3)
Chloride: 103 mmol/L (ref 96–106)
Creatinine, Ser: 1.11 mg/dL — ABNORMAL HIGH (ref 0.57–1.00)
GFR calc Af Amer: 62 mL/min/{1.73_m2} (ref >59)
GFR calc non Af Amer: 53 mL/min/{1.73_m2} — ABNORMAL LOW (ref >59)
Globulin, Total: 2.8 g/dL (ref 1.5–4.5)
Glucose: 158 mg/dL — ABNORMAL HIGH (ref 65–99)
Potassium: 4.8 mmol/L (ref 3.5–5.2)
Sodium: 139 mmol/L (ref 134–144)
Total Protein: 7.1 g/dL (ref 6.0–8.5)

## 2020-03-13 LAB — AUTOIMMUNE PROFILE
Anti Nuclear Antibody (ANA): NEGATIVE
Complement C3, Serum: 162 mg/dL (ref 82–167)
dsDNA Ab: 1 IU/mL (ref 0–9)

## 2020-03-13 LAB — HEMOGLOBIN A1C
Est. average glucose Bld gHb Est-mCnc: 160 mg/dL
Hgb A1c MFr Bld: 7.2 % — ABNORMAL HIGH (ref 4.8–5.6)

## 2020-03-13 LAB — SEDIMENTATION RATE: Sed Rate: 22 mm/hr (ref 0–40)

## 2020-03-15 ENCOUNTER — Encounter: Payer: Self-pay | Admitting: Nurse Practitioner

## 2020-03-18 ENCOUNTER — Other Ambulatory Visit: Payer: Self-pay

## 2020-03-18 ENCOUNTER — Ambulatory Visit: Payer: Self-pay

## 2020-03-18 ENCOUNTER — Telehealth: Payer: Self-pay

## 2020-03-18 DIAGNOSIS — M4310 Spondylolisthesis, site unspecified: Secondary | ICD-10-CM

## 2020-03-18 DIAGNOSIS — E1122 Type 2 diabetes mellitus with diabetic chronic kidney disease: Secondary | ICD-10-CM

## 2020-03-18 DIAGNOSIS — N1832 Chronic kidney disease, stage 3b: Secondary | ICD-10-CM

## 2020-03-18 DIAGNOSIS — I10 Essential (primary) hypertension: Secondary | ICD-10-CM

## 2020-03-18 DIAGNOSIS — W19XXXD Unspecified fall, subsequent encounter: Secondary | ICD-10-CM

## 2020-03-19 ENCOUNTER — Other Ambulatory Visit: Payer: Self-pay | Admitting: Neurosurgery

## 2020-03-20 NOTE — Chronic Care Management (AMB) (Signed)
Care Management   Follow Up Note   03/19/2020 Name: Kara Kelley MRN: [id number] DOB: 23-Jul-1956  Referred by: [name], FNP Reason for referral : Care Coordination (FU RN CM Call - DM, Fall )   Kara Kelley is a 64 y.o. year old female who is a primary care patient of [name], [place]. The CCM team was consulted for assistance with chronic disease management and care coordination needs.    Review of patient status, including review of consultants reports, relevant laboratory and other test results, and collaboration with appropriate care team members and the patient's provider was performed as part of comprehensive patient evaluation and provision of chronic care management services.    SDOH (Social Determinants of Health) assessments performed: Yes - no acute needs  See Care Plan activities for detailed interventions related to [place])    Placed outbound [place] RN CM follow up call to patient for a care plan follow up.   Outpatient Encounter Medications as of 03/18/2020  Medication Sig  . LEVEMIR FLEXTOUCH 100 UNIT/ML Pen INJECT 40 UNITS INTO SKIN AT BEDTIME  . liraglutide (VICTOZA) 18 MG/3ML SOPN Inject 0.3 mLs (1.8 mg total) into the skin daily.  . Accu-Chek FastClix Lancets MISC USE TO CHECK BLOOD GLUCOSE THREE TIMES DAILY  . ACCU-CHEK GUIDE test strip USE TO TEST FOUR TIMES DAILY  . albuterol (PROVENTIL HFA;VENTOLIN HFA) 108 (90 Base) MCG/ACT inhaler Inhale 2 puffs into the lungs every 6 (six) hours as needed. For shortness of breath.  . ARIPiprazole (ABILIFY) 10 MG tablet Take 10 mg by mouth daily.   [name] atorvastatin (LIPITOR) 10 MG tablet Take 1 tablet (10 mg total) by mouth daily.  . Blood Glucose Monitoring Suppl (ACCU-CHEK NANO SMARTVIEW) w/Device KIT 1 each by Does not apply route 3 (three) times daily.  [name] buPROPion (WELLBUTRIN XL) 300 MG 24 hr tablet Take 300 mg by mouth every morning.  [name] CIPRODEX OTIC suspension SHAKE LQ AND INT 4 GTS IN AU BID X 7 DAYS  .  cyclobenzaprine (FLEXERIL) 5 MG tablet Take 1 tablet (5 mg total) by mouth 3 (three) times daily as needed for muscle spasms.  [name] doxycycline (VIBRA-TABS) 100 MG tablet Take 1 tablet (100 mg total) by mouth 2 (two) times daily. (Patient not taking: Reported on 12/19/2019)  . HYDROcodone-acetaminophen (NORCO/VICODIN) 5-325 MG tablet Take 1 tablet by mouth every 6 (six) hours as needed. (Patient not taking: Reported on 12/19/2019)  . Insulin Pen Needle (PEN NEEDLES) 32G X 4 MM MISC 1 each by Does not apply route 3 (three) times daily.  [name] lidocaine (LIDODERM) 5 % Place 1 patch onto the skin daily. Remove & Discard patch within 12 hours or as directed by MD  . Rolan Lipa 145 MCG CAPS capsule TK ONE C PO AT LEAST 30 MINUTES BEFORE THE FIRST MEAL OF THE DAY ON A EMPTY STOMACH  . lisinopril (ZESTRIL) 10 MG tablet TAKE 1 TABLET(10 MG) BY MOUTH DAILY  . Magnesium 200 MG TABS Take one tablet by mouth with evening meal. (Patient not taking: Reported on 12/19/2019)  . meloxicam (MOBIC) 15 MG tablet Take 1 tablet (15 mg total) by mouth daily.  . metFORMIN (GLUCOPHAGE) 500 MG tablet TAKE 1 TABLET(500 MG) BY MOUTH TWICE DAILY WITH A MEAL (Patient not taking: Reported on 03/19/2020)  . NOVOLOG FLEXPEN 100 UNIT/ML FlexPen ADMINISTER 18 UNITS UNDER THE SKIN THREE TIMES DAILY AS NEEDED FOR HIGH BLOOD SUGAR  . omeprazole (PRILOSEC) 40 MG capsule TAKE 1 CAPSULE(40  MG) BY MOUTH DAILY BEFORE BREAKFAST  . promethazine (PHENERGAN) 25 MG tablet TK 1 T PO Q 8 H PRF NAUSEA OR VOM  . promethazine (PHENERGAN) 25 MG tablet Take 1 tablet (25 mg total) by mouth every 8 (eight) hours as needed for nausea or vomiting. (Patient not taking: Reported on 12/19/2019)  . Semaglutide,0.25 or 0.5MG/DOS, (OZEMPIC, 0.25 OR 0.5 MG/DOSE,) 2 MG/1.5ML SOPN Inject 0.5 mg into the skin once a week. (Patient not taking: Reported on 12/19/2019)  . spironolactone (ALDACTONE) 50 MG tablet TAKE 1 TABLET(50 MG) BY MOUTH DAILY  . traZODone (DESYREL) 150 MG tablet TK  1 T PO HS   No facility-administered encounter medications on file as of 03/18/2020.     Objective:  Lab Results  Component Value Date   HGBA1C 7.2 (H) 03/12/2020   HGBA1C 7.1 (H) 12/19/2019   HGBA1C 7.1 (H) 09/23/2019   Lab Results  Component Value Date   MICROALBUR 80 06/19/2019   LDLCALC 46 12/19/2019   CREATININE 1.11 (H) 03/12/2020   BP Readings from Last 3 Encounters:  03/12/20 130/80  03/07/20 (!) 152/83  12/19/19 130/86    Goals Addressed      Patient Stated   .  "I have to have back surgery" (pt-stated)        CARE PLAN ENTRY (see longitudinal plan of care for additional care plan information)  Current Barriers:  [name] Knowledge Deficits related to treatment for degenerative spondylosis  . Chronic Disease Management support and education needs related to Essential Hypertension, DMII, Degenerative Spondylosis, fall   Nurse Case Manager Clinical Goal(s):  [name] Over the next 30 days, patient will verbalize understanding of plan for treatment of chronic degenerative spondylosis with associated pain and recent fall   CCM RN CM Interventions:  03/19/20 call completed with patient  . Inter-disciplinary care team collaboration (see longitudinal plan of care) . Evaluation of current treatment plan related to chronic degenerative spondylosis and patient's adherence to plan as established by provider . Determined patient experienced a fall in her home that required an ED visit on 03/07/20 following an incident that she describes, "my legs just gave out from under me without warning" . Reviewed and discussed ED findings and patient was advised there was no acute injury but chronic progressive degenerative changes  . Reviewed medications with patient and discussed patient has Meloxicam and cyclobenzaprine ordered for pain, otherwise she does not wish to take narcotics . Determined patient f/u with Dr. [name], Neurosurgeon on 03/18/20 to review her lumbar CT results, she will require  surgical intervention  . Per chart review patient has a planned admission to [facility] on 04/03/20 to undergo a Posterior Lumbar Interbody Fusion to L5-S1 . Assessed for caregiver support post discharge and patient's daughter is available to assist her as needed, currently her daughter is assisting with transportation and ADL's/iADL's as needed   . Advised patient to notify MD promptly or go to the ED for new or worsening symptoms and or recurrent falls . Discussed plans with patient for ongoing care management follow up and provided patient with direct contact information for care management team  Patient Self Care Activities:  . Self administers medications as prescribed . Attends all scheduled provider appointments . Calls pharmacy for medication refills . Calls provider office for new concerns or questions  Initial goal documentation     .  "I need to learn how to eat better" (pt-stated)        Current Barriers:  .  Knowledge Deficits related to diabetes disease process and Self Health Mangement  . A1C 6.9  Nurse Case Manager Clinical Goal(s):  [name] Over the next 60 days, patient will work with the CCM team to address needs related to diabetes disease education and improved Self Health management  . Over the next 90 days, patient will lower her A1C <6.9  CCM RN CM Interventions:  03/19/20 call completed with patient  . Evaluation of current treatment plan related to diabetes and patient's adherence to plan as established by provider . Provided education to patient re: patient's most recent A1c has increased to 7.2 % obtained on 03/12/20; discussed target A1c <7.0; Education provided related to daily glycemic control, FBS 80-130, <180 after meals; Reiterated importance of following ADA dietary recommendations including Meal Planning with using the Plate Method and lowering intake of carbohydrates; discussed the importance of implementing a routine exercise regimen; 150  minutes per week; discussed importance of taking medications exactly as prescribed . Reviewed medications with patient and discussed indication, dosage and frequency of her prescribed diabetic medications;  o Levemir 100u/ml, injecting 40 units at bedtime o Liraglutide (Victoza) 18 mg/3 ml, injecting 0.3 ml (1.8 mg daily) o Previous treatments; Metformin; Ozempic  . Patient admits she would benefit from working with DM Nutritionist and would like a referral placed  . Discussed sending embedded Pharm D referral to review current DM medication regimen and make appropriate recommendations . Sent in basket message to PCP provider [name], FNP requesting a Nutritionist referral, sent Pharm D referral  . Advised patient, providing education and rationale, to check cbg 1-2 times daily before meals and record, calling the CCM team and or PCP provider for findings outside established parameters . Discussed plans with patient for ongoing care management follow up and provided patient with direct contact information for care management team  Patient Self Care Activities:  . Self administers medications as prescribed . Attends all scheduled provider appointments . Calls pharmacy for medication refills . Attends church or other social activities . Performs ADL's independently . Performs IADL's independently . Calls provider office for new concerns or questions  Please see past updates related to this goal by clicking on the "Past Updates" button in the selected goal        Plan:   Telephone follow up appointment with care management team member scheduled for: 04/01/20

## 2020-03-20 NOTE — Patient Instructions (Signed)
Visit Information  Goals Addressed            This Visit's Progress     Patient Stated   .         CARE PLAN ENTRY (see longitudinal plan of care for additional care plan information)  Current Barriers:  Kara Kelley Knowledge Deficits related to treatment for degenerative spondylosis  . Chronic Disease Management support and education needs related to Essential Hypertension, DMII, Degenerative Spondylosis, fall   Nurse Case Manager Clinical Goal(s):  Kara Kelley Over the next 30 days, patient will verbalize understanding of plan for treatment of chronic degenerative spondylosis with associated pain and recent fall   CCM RN CM Interventions:  03/19/20 call completed with patient  . Inter-disciplinary care team collaboration (see longitudinal plan of care) . Evaluation of current treatment plan related to chronic degenerative spondylosis and patient's adherence to plan as established by provider . Determined patient experienced a fall in her home that required an ED visit on 03/07/20 following an incident that she describes, "my legs just gave out from under me without warning" . Reviewed and discussed ED findings and patient was advised there was no acute injury but chronic progressive degenerative changes  . Reviewed medications with patient and discussed patient has Meloxicam and cyclobenzaprine ordered for pain, otherwise she does not wish to take narcotics . Determined patient f/u with Dr. Annette Stable Kelley, Neurosurgeon on 03/18/20 to review her lumbar CT results, she will require surgical intervention  . Per chart review patient has a planned admission to [facility] on 04/03/20 to undergo a Posterior Lumbar Interbody Fusion to L5-S1 . Assessed for caregiver support post discharge and patient's daughter is available to assist her as needed, currently her daughter is assisting with transportation and ADL's/iADL's as needed   . Advised patient to notify Kelley promptly or go to the ED for new or  worsening symptoms and or recurrent falls . Discussed plans with patient for ongoing care management follow up and provided patient with direct contact information for care management team  Patient Self Care Activities:  . Self administers medications as prescribed . Attends all scheduled provider appointments . Calls pharmacy for medication refills . Calls provider office for new concerns or questions  Initial goal documentation     .         Current Barriers:  Kara Kelley Knowledge Deficits related to diabetes disease process and Self Health Mangement  . A1C 6.9  Nurse Case Manager Clinical Goal(s):  Kara Kelley Over the next 60 days, patient will work with the CCM team to address needs related to diabetes disease education and improved Self Health management  . Over the next 90 days, patient will lower her A1C <6.9  CCM RN CM Interventions:  03/19/20 call completed with patient  . Evaluation of current treatment plan related to diabetes and patient's adherence to plan as established by provider . Provided education to patient re: patient's most recent A1c has increased to 7.2 % obtained on 03/12/20; discussed target A1c <7.0; Education provided related to daily glycemic control, FBS 80-130, <180 after meals; Reiterated importance of following ADA dietary recommendations including Meal Planning with using the Plate Method and lowering intake of carbohydrates; discussed the importance of implementing a routine exercise regimen; 150 minutes per week; discussed importance of taking medications exactly as prescribed . Reviewed medications with patient and discussed indication, dosage and frequency of her prescribed diabetic medications;  o Levemir 100u/ml, injecting 40 units at bedtime o Liraglutide (Victoza)  18 mg/3 ml, injecting 0.3 ml (1.8 mg daily) o Previous treatments; Metformin; Ozempic  . Patient admits she would benefit from working with DM Nutritionist and would like a referral placed  . Discussed  sending embedded Pharm D referral to review current DM medication regimen and make appropriate recommendations . Sent in basket message to PCP provider Arnette Felts, FNP requesting a Nutritionist referral, sent Pharm D referral  . Advised patient, providing education and rationale, to check cbg 1-2 times daily before meals and record, calling the CCM team and or PCP provider for findings outside established parameters . Discussed plans with patient for ongoing care management follow up and provided patient with direct contact information for care management team  Patient Self Care Activities:  . Self administers medications as prescribed . Attends all scheduled provider appointments . Calls pharmacy for medication refills . Attends church or other social activities . Performs ADL's independently . Performs IADL's independently . Calls provider office for new concerns or questions  Please see past updates related to this goal by clicking on the "Past Updates" button in the selected goal        Patient verbalizes understanding of instructions provided today.   Telephone follow up appointment with care management team member scheduled for: 04/01/20  [name], RN, BSN, CCM Care Management Coordinator [facility]  Direct Phone: [phone]

## 2020-03-23 ENCOUNTER — Telehealth: Payer: Self-pay | Admitting: Nurse Practitioner

## 2020-03-23 NOTE — Chronic Care Management (AMB) (Signed)
Care Management   Note  03/23/2020 Name: Kara Kelley MRN: [id number] DOB: 12-15-1955  Kara Kelley is a 64 y.o. year old female who is a primary care patient of Arnette Felts, FNP. Kara Kelley is currently enrolled in care management services. An additional referral for Pharm D was placed.   Follow up plan: Unsuccessful telephone outreach attempt made. A HIPPA compliant phone message was left for the patient providing contact information and requesting a return call.  If patient returns call to provider office, please advise to call Embedded Care Management Care Guide Kara Ponder LPN at [phone]  Kara Ponder, LPN Health Advisor, Embedded Care Coordination [facility] Health Care Management ??[email or web address] ??[phone]

## 2020-03-24 NOTE — Chronic Care Management (AMB) (Signed)
Care Management   Note  03/24/2020 Name: Kara Kelley MRN: [id number] DOB: 07-Jun-1956  Kara Kelley is a 65 y.o. year old female who is a primary care patient of Arnette Felts, FNP. Kara Kelley is currently enrolled in care management services. An additional referral for Pharm D was placed.   Follow up plan: Telephone appointment with care management team member scheduled for:04/17/2020  Elisha Ponder, LPN Health Advisor, Embedded Care Coordination [facility] Health Care Management ??[email or web address] ??[phone]

## 2020-03-27 ENCOUNTER — Ambulatory Visit: Payer: Self-pay

## 2020-03-27 DIAGNOSIS — E1122 Type 2 diabetes mellitus with diabetic chronic kidney disease: Secondary | ICD-10-CM

## 2020-03-27 DIAGNOSIS — I10 Essential (primary) hypertension: Secondary | ICD-10-CM

## 2020-03-27 NOTE — Chronic Care Management (AMB) (Signed)
Care Management    Social Work Follow Up Note  03/27/2020 Name: Kara Kelley MRN: [id number] DOB: 1956/03/07  Kara Kelley is a 64 y.o. year old female who is a primary care patient of [name], [place]. The CCM team was consulted for assistance with care coordination.   Review of patient status, including review of consultants reports, other relevant assessments, and collaboration with appropriate care team members and the patient's provider was performed as part of comprehensive patient evaluation and provision of chronic care management services.    SDOH (Social Determinants of Health) assessments performed: Yes; no acute challenges noted. SDOH Interventions     Most Recent Value  SDOH Interventions  Food Insecurity Interventions Intervention Not Indicated  Housing Interventions Intervention Not Indicated  Transportation Interventions Intervention Not Indicated       Outpatient Encounter Medications as of 03/27/2020  Medication Sig  . Accu-Chek FastClix Lancets MISC USE TO CHECK BLOOD GLUCOSE THREE TIMES DAILY  . ACCU-CHEK GUIDE test strip USE TO TEST FOUR TIMES DAILY  . albuterol (PROVENTIL HFA;VENTOLIN HFA) 108 (90 Base) MCG/ACT inhaler Inhale 2 puffs into the lungs every 6 (six) hours as needed. For shortness of breath. (Patient taking differently: Inhale 2 puffs into the lungs every 6 (six) hours as needed for wheezing or shortness of breath. )  . ARIPiprazole (ABILIFY) 10 MG tablet Take 10 mg by mouth daily.   [name] atorvastatin (LIPITOR) 10 MG tablet Take 1 tablet (10 mg total) by mouth daily.  . Blood Glucose Monitoring Suppl (ACCU-CHEK NANO SMARTVIEW) w/Device KIT 1 each by Does not apply route 3 (three) times daily.  [name] buPROPion (WELLBUTRIN XL) 300 MG 24 hr tablet Take 300 mg by mouth daily.   . Cholecalciferol (VITAMIN D) 50 MCG (2000 UT) CAPS Take 2,000 Units by mouth daily.  . cyclobenzaprine (FLEXERIL) 5 MG tablet Take 1 tablet (5 mg total) by mouth 3 (three) times  daily as needed for muscle spasms. (Patient not taking: Reported on 03/25/2020)  . diclofenac Sodium (VOLTAREN) 1 % GEL Apply 2 g topically 2 (two) times daily as needed (pain).  [name] EVENING PRIMROSE OIL PO Take 1,000 mg by mouth daily.  [name] gabapentin (NEURONTIN) 300 MG capsule Take 300 mg by mouth 3 (three) times daily as needed (pain).  . Insulin Pen Needle (PEN NEEDLES) 32G X 4 MM MISC 1 each by Does not apply route 3 (three) times daily.  [name] LEVEMIR FLEXTOUCH 100 UNIT/ML Pen INJECT 40 UNITS INTO SKIN AT BEDTIME (Patient taking differently: Inject 30 Units into the skin at bedtime. )  . lidocaine (LIDODERM) 5 % Place 1 patch onto the skin daily. Remove & Discard patch within 12 hours or as directed by MD (Patient taking differently: Place 1 patch onto the skin daily as needed (pain). Remove & Discard patch within 12 hours or as directed by MD)  . LINZESS 145 MCG CAPS capsule Take 145 mcg by mouth daily before breakfast.   . liraglutide (VICTOZA) 18 MG/3ML SOPN Inject 0.3 mLs (1.8 mg total) into the skin daily. (Patient not taking: Reported on 03/25/2020)  . lisinopril (ZESTRIL) 10 MG tablet TAKE 1 TABLET(10 MG) BY MOUTH DAILY (Patient taking differently: Take 10 mg by mouth daily. )  . Magnesium 200 MG TABS Take one tablet by mouth with evening meal. (Patient not taking: Reported on 12/19/2019)  . meloxicam (MOBIC) 15 MG tablet Take 1 tablet (15 mg total) by mouth daily. (Patient not taking: Reported on 03/25/2020)  . metFORMIN (  Care Management    Social Work Follow Up Note  03/27/2020 Name: Kara Kelley MRN: [id number] DOB: 1956/03/07  Kara Kelley is a 64 y.o. year old female who is a primary care patient of [name], [place]. The CCM team was consulted for assistance with care coordination.   Review of patient status, including review of consultants reports, other relevant assessments, and collaboration with appropriate care team members and the patient's provider was performed as part of comprehensive patient evaluation and provision of chronic care management services.    SDOH (Social Determinants of Health) assessments performed: Yes; no acute challenges noted. SDOH Interventions     Most Recent Value  SDOH Interventions  Food Insecurity Interventions Intervention Not Indicated  Housing Interventions Intervention Not Indicated  Transportation Interventions Intervention Not Indicated       Outpatient Encounter Medications as of 03/27/2020  Medication Sig  . Accu-Chek FastClix Lancets MISC USE TO CHECK BLOOD GLUCOSE THREE TIMES DAILY  . ACCU-CHEK GUIDE test strip USE TO TEST FOUR TIMES DAILY  . albuterol (PROVENTIL HFA;VENTOLIN HFA) 108 (90 Base) MCG/ACT inhaler Inhale 2 puffs into the lungs every 6 (six) hours as needed. For shortness of breath. (Patient taking differently: Inhale 2 puffs into the lungs every 6 (six) hours as needed for wheezing or shortness of breath. )  . ARIPiprazole (ABILIFY) 10 MG tablet Take 10 mg by mouth daily.   [name] atorvastatin (LIPITOR) 10 MG tablet Take 1 tablet (10 mg total) by mouth daily.  . Blood Glucose Monitoring Suppl (ACCU-CHEK NANO SMARTVIEW) w/Device KIT 1 each by Does not apply route 3 (three) times daily.  [name] buPROPion (WELLBUTRIN XL) 300 MG 24 hr tablet Take 300 mg by mouth daily.   . Cholecalciferol (VITAMIN D) 50 MCG (2000 UT) CAPS Take 2,000 Units by mouth daily.  . cyclobenzaprine (FLEXERIL) 5 MG tablet Take 1 tablet (5 mg total) by mouth 3 (three) times  daily as needed for muscle spasms. (Patient not taking: Reported on 03/25/2020)  . diclofenac Sodium (VOLTAREN) 1 % GEL Apply 2 g topically 2 (two) times daily as needed (pain).  [name] EVENING PRIMROSE OIL PO Take 1,000 mg by mouth daily.  [name] gabapentin (NEURONTIN) 300 MG capsule Take 300 mg by mouth 3 (three) times daily as needed (pain).  . Insulin Pen Needle (PEN NEEDLES) 32G X 4 MM MISC 1 each by Does not apply route 3 (three) times daily.  [name] LEVEMIR FLEXTOUCH 100 UNIT/ML Pen INJECT 40 UNITS INTO SKIN AT BEDTIME (Patient taking differently: Inject 30 Units into the skin at bedtime. )  . lidocaine (LIDODERM) 5 % Place 1 patch onto the skin daily. Remove & Discard patch within 12 hours or as directed by MD (Patient taking differently: Place 1 patch onto the skin daily as needed (pain). Remove & Discard patch within 12 hours or as directed by MD)  . LINZESS 145 MCG CAPS capsule Take 145 mcg by mouth daily before breakfast.   . liraglutide (VICTOZA) 18 MG/3ML SOPN Inject 0.3 mLs (1.8 mg total) into the skin daily. (Patient not taking: Reported on 03/25/2020)  . lisinopril (ZESTRIL) 10 MG tablet TAKE 1 TABLET(10 MG) BY MOUTH DAILY (Patient taking differently: Take 10 mg by mouth daily. )  . Magnesium 200 MG TABS Take one tablet by mouth with evening meal. (Patient not taking: Reported on 12/19/2019)  . meloxicam (MOBIC) 15 MG tablet Take 1 tablet (15 mg total) by mouth daily. (Patient not taking: Reported on 03/25/2020)  . metFORMIN (

## 2020-03-27 NOTE — Patient Instructions (Signed)
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 which put patient at high risk for admission  Social Work Clinical Goal(s):   Over the next 45 days, patient will verbalize basic understanding of Advanced Directives and importance of completion  CCM SW Interventions: Completed 03/27/20  Interviewed patient about Advanced Directives and provided education about the importance of completing advanced directives  Mailed the patient an Proofreader packet  Advised patient to review information mailed by this SW  Scheduled follow up call over the next month to assess goal progression  Patient Self Care Activities:   Is able to readily make contact with social support system  Can identify next of kin, power or attorney, guardian, or primary caregiver  Attends all scheduled provider appointments  Calls provider office for new concerns or questions           The care management team will reach out to the patient again over the next 25 days.   Bevelyn Ngo, BSW, CDP Social Worker, Certified Dementia Practitioner [facility] Care Management [phone]

## 2020-03-29 ENCOUNTER — Other Ambulatory Visit: Payer: Self-pay | Admitting: Nurse Practitioner

## 2020-03-31 ENCOUNTER — Other Ambulatory Visit: Payer: Self-pay

## 2020-03-31 ENCOUNTER — Encounter (HOSPITAL_COMMUNITY): Payer: Self-pay

## 2020-03-31 ENCOUNTER — Other Ambulatory Visit (HOSPITAL_COMMUNITY)
Admission: RE | Admit: 2020-03-31 | Discharge: 2020-03-31 | Disposition: A | Discharge status: Home / Self Care | Payer: Medicaid Other | Source: Ambulatory Visit | Attending: Neurosurgery | Admitting: Neurosurgery

## 2020-03-31 ENCOUNTER — Encounter (HOSPITAL_COMMUNITY)
Admission: RE | Admit: 2020-03-31 | Discharge: 2020-03-31 | Disposition: A | Discharge status: Home / Self Care | Payer: Medicaid Other | Source: Ambulatory Visit | Attending: Neurosurgery | Admitting: Neurosurgery

## 2020-03-31 DIAGNOSIS — Z20822 Contact with and (suspected) exposure to covid-19: Secondary | ICD-10-CM | POA: Diagnosis not present

## 2020-03-31 DIAGNOSIS — Z01812 Encounter for preprocedural laboratory examination: Secondary | ICD-10-CM | POA: Diagnosis present

## 2020-03-31 LAB — TYPE AND SCREEN
ABO/RH(D): A POS
Antibody Screen: NEGATIVE

## 2020-03-31 LAB — BASIC METABOLIC PANEL
Anion gap: 10 (ref 5–15)
BUN: 10 mg/dL (ref 8–23)
CO2: 21 mmol/L — ABNORMAL LOW (ref 22–32)
Calcium: 9.6 mg/dL (ref 8.9–10.3)
Chloride: 107 mmol/L (ref 98–111)
Creatinine, Ser: 1.24 mg/dL — ABNORMAL HIGH (ref 0.44–1.00)
GFR calc Af Amer: 54 mL/min — ABNORMAL LOW (ref >60)
GFR calc non Af Amer: 47 mL/min — ABNORMAL LOW (ref >60)
Glucose, Bld: 155 mg/dL — ABNORMAL HIGH (ref 70–99)
Potassium: 4.4 mmol/L (ref 3.5–5.1)
Sodium: 138 mmol/L (ref 135–145)

## 2020-03-31 LAB — GLUCOSE, CAPILLARY: Glucose-Capillary: 143 mg/dL — ABNORMAL HIGH (ref 70–99)

## 2020-03-31 LAB — CBC WITH DIFFERENTIAL/PLATELET
Abs Immature Granulocytes: 0.02 10*3/uL (ref 0.00–0.07)
Basophils Absolute: 0.1 10*3/uL (ref 0.0–0.1)
Basophils Relative: 1 %
Eosinophils Absolute: 0.3 10*3/uL (ref 0.0–0.5)
Eosinophils Relative: 4 %
HCT: 36 % (ref 36.0–46.0)
Hemoglobin: 11.5 g/dL — ABNORMAL LOW (ref 12.0–15.0)
Immature Granulocytes: 0 %
Lymphocytes Relative: 26 %
Lymphs Abs: 1.8 10*3/uL (ref 0.7–4.0)
MCH: 24.9 pg — ABNORMAL LOW (ref 26.0–34.0)
MCHC: 31.9 g/dL (ref 30.0–36.0)
MCV: 78.1 fL — ABNORMAL LOW (ref 80.0–100.0)
Monocytes Absolute: 0.3 10*3/uL (ref 0.1–1.0)
Monocytes Relative: 4 %
Neutro Abs: 4.4 10*3/uL (ref 1.7–7.7)
Neutrophils Relative %: 65 %
Platelets: 159 10*3/uL (ref 150–400)
RBC: 4.61 MIL/uL (ref 3.87–5.11)
RDW: 15 % (ref 11.5–15.5)
WBC: 6.7 10*3/uL (ref 4.0–10.5)
nRBC: 0 % (ref 0.0–0.2)

## 2020-03-31 LAB — SURGICAL PCR SCREEN
MRSA, PCR: NEGATIVE
Staphylococcus aureus: NEGATIVE

## 2020-03-31 LAB — SARS CORONAVIRUS 2 (TAT 6-24 HRS): SARS Coronavirus 2: NEGATIVE

## 2020-03-31 NOTE — Pre-Procedure Instructions (Addendum)
[id number] [place], [place] - [phone] AT [facility] & [address] [place] [address] Phone: [phone] Fax: [phone]      Your procedure is scheduled on Friday June 25th.  Report to [facility] Main Entrance "A" at 9:30 A.M., and check in at the Admitting office.  Call this number if you have problems the morning of surgery:  [phone]  Call [phone] if you have any questions prior to your surgery date Monday-Friday 8am-4pm    Remember:  Do not eat or drink after midnight the night before your surgery    Take these medicines the morning of surgery with A SIP OF WATER   ARIPiprazole (ABILIFY) 10 MG   atorvastatin (LIPITOR) 10 MG  buPROPion (WELLBUTRIN XL) 300 MG  omeprazole (PRILOSEC) 40 MG   gabapentin (NEURONTIN) 300 MG as needed for pain  promethazine (PHENERGAN) 25 MG   Albuterol as needed for shortness of breath    As of today, STOP taking any Aspirin (unless otherwise instructed by your surgeon) and Aspirin containing products, Aleve, Naproxen, Ibuprofen, Motrin, Advil, Goody's, BC's, all herbal medications, fish oil, and all vitamins.   WHAT DO I DO ABOUT MY DIABETES MEDICATION?   Novolog - is CBG is more than 220 take 1/2 of usual dose (9 Units) on day of surgery Novolog - No bedtime dose the night before surgery Levemir - Take 50% (15 units) of dinner/bedtime dose Levemir - Take 50% (15 units) of dose on day of surgery   . Do not take oral diabetes medicines (pills) the morning of surgery.  . The day of surgery, do not take other diabetes injectables, including Byetta (exenatide), Bydureon (exenatide ER), Victoza (liraglutide), or Trulicity (dulaglutide).  . If your CBG is greater than 220 mg/dL, you may take  of your sliding scale (correction) dose of insulin.   HOW TO MANAGE YOUR DIABETES BEFORE AND AFTER SURGERY  Why is it important to control my blood sugar before and after  surgery? . Improving blood sugar levels before and after surgery helps healing and can limit problems. . A way of improving blood sugar control is eating a healthy diet by: o  Eating less sugar and carbohydrates o  Increasing activity/exercise o  Talking with your doctor about reaching your blood sugar goals . High blood sugars (greater than 180 mg/dL) can raise your risk of infections and slow your recovery, so you will need to focus on controlling your diabetes during the weeks before surgery. . Make sure that the doctor who takes care of your diabetes knows about your planned surgery including the date and location.  How do I manage my blood sugar before surgery? . Check your blood sugar at least 4 times a day, starting 2 days before surgery, to make sure that the level is not too high or low. . Check your blood sugar the morning of your surgery when you wake up and every 2 hours until you get to the Short Stay unit. o If your blood sugar is less than 70 mg/dL, you will need to treat for low blood sugar: - Do not take insulin. - Treat a low blood sugar (less than 70 mg/dL) with  cup of clear juice (cranberry or apple), 4 glucose tablets, OR glucose gel. - Recheck blood sugar in 15 minutes after treatment (to make sure it is greater than 70 mg/dL). If your blood sugar is not greater than 70  mg/dL on recheck, call [phone] for further instructions. . Report your blood sugar to the short stay nurse when you get to Short Stay.  . If you are admitted to the hospital after surgery: o Your blood sugar will be checked by the staff and you will probably be given insulin after surgery (instead of oral diabetes medicines) to make sure you have good blood sugar levels. o The goal for blood sugar control after surgery is 80-180 mg/dL.                    Do not wear jewelry, make up, or nail polish            Do not wear lotions, powders, perfumes, or deodorant.            Do not shave 48 hours  prior to surgery.             Do not bring valuables to the hospital.            [facility] is not responsible for any belongings or valuables.  Do NOT Smoke (Tobacco/Vapping) or drink Alcohol 24 hours prior to your procedure If you use a CPAP at night, you may bring all equipment for your overnight stay.   Contacts, glasses, dentures or bridgework may not be worn into surgery.      For patients admitted to the hospital, discharge time will be determined by your treatment team.   Patients discharged the day of surgery will not be allowed to drive home, and someone needs to stay with them for 24 hours.    Special instructions:   [place]- Preparing For Surgery  Before surgery, you can play an important role. Because skin is not sterile, your skin needs to be as free of germs as possible. You can reduce the number of germs on your skin by washing with CHG (chlorahexidine gluconate) Soap before surgery.  CHG is an antiseptic cleaner which kills germs and bonds with the skin to continue killing germs even after washing.    Oral Hygiene is also important to reduce your risk of infection.  Remember - BRUSH YOUR TEETH THE MORNING OF SURGERY WITH YOUR REGULAR TOOTHPASTE  Please do not use if you have an allergy to CHG or antibacterial soaps. If your skin becomes reddened/irritated stop using the CHG.  Do not shave (including legs and underarms) for at least 48 hours prior to first CHG shower. It is OK to shave your face.  Please follow these instructions carefully.   1. Shower the NIGHT BEFORE SURGERY and the MORNING OF SURGERY with CHG Soap.   2. If you chose to wash your hair, wash your hair first as usual with your normal shampoo.  3. After you shampoo, rinse your hair and body thoroughly to remove the shampoo.  4. Use CHG as you would any other liquid soap. You can apply CHG directly to the skin and wash gently with a scrungie or a clean washcloth.   5. Apply the CHG Soap to your  body ONLY FROM THE NECK DOWN.  Do not use on open wounds or open sores. Avoid contact with your eyes, ears, mouth and genitals (private parts). Wash Face and genitals (private parts)  with your normal soap.   6. Wash thoroughly, paying special attention to the area where your surgery will be performed.  7. Thoroughly rinse your body with warm water from the neck down.  8. DO NOT shower/wash with your  normal soap after using and rinsing off the CHG Soap.  9. Pat yourself dry with a CLEAN TOWEL.  10. Wear CLEAN PAJAMAS to bed the night before surgery, wear comfortable clothes the morning of surgery  11. Place CLEAN SHEETS on your bed the night of your first shower and DO NOT SLEEP WITH PETS.   Day of Surgery:   Do not apply any deodorants/lotions.  Please wear clean clothes to the hospital/surgery center.   Remember to brush your teeth WITH YOUR REGULAR TOOTHPASTE.   Please read over the following fact sheets that you were given.

## 2020-03-31 NOTE — Progress Notes (Signed)
PCP - Penelope Galas FNP with [facility] Cardiologist - Denies  Chest x-ray - Not indicated EKG - 09/09/19 Stress Test - Denies ECHO - Denies Cardiac Cath - Denies  Sleep Study - Denies  DM - Yes Fasting Blood Sugar - 143 CBG @ PAT appt Averages 100's - 140's at home Checks Blood Sugar ___3__ times a day  COVID TEST- June 22nd directions given  Anesthesia review: No  Patient denies shortness of breath, fever, cough and chest pain at PAT appointment   All instructions explained to the patient, with a verbal understanding of the material. Patient agrees to go over the instructions while at home for a better understanding. Patient also instructed to self quarantine after being tested for COVID-19. The opportunity to ask questions was provided.

## 2020-04-01 ENCOUNTER — Ambulatory Visit: Payer: Self-pay

## 2020-04-01 ENCOUNTER — Telehealth: Payer: Self-pay

## 2020-04-01 DIAGNOSIS — M4310 Spondylolisthesis, site unspecified: Secondary | ICD-10-CM

## 2020-04-01 DIAGNOSIS — E1122 Type 2 diabetes mellitus with diabetic chronic kidney disease: Secondary | ICD-10-CM

## 2020-04-01 DIAGNOSIS — W19XXXD Unspecified fall, subsequent encounter: Secondary | ICD-10-CM

## 2020-04-01 DIAGNOSIS — I10 Essential (primary) hypertension: Secondary | ICD-10-CM

## 2020-04-01 DIAGNOSIS — N1832 Chronic kidney disease, stage 3b: Secondary | ICD-10-CM

## 2020-04-03 ENCOUNTER — Ambulatory Visit (HOSPITAL_COMMUNITY): Payer: Medicaid Other

## 2020-04-03 ENCOUNTER — Encounter (HOSPITAL_COMMUNITY)
Admission: RE | Disposition: A | Discharge status: Home / Self Care | Payer: Self-pay | Source: Home / Self Care | Attending: Neurosurgery

## 2020-04-03 ENCOUNTER — Observation Stay (HOSPITAL_COMMUNITY)
Admission: RE | Admit: 2020-04-03 | Discharge: 2020-04-04 | Disposition: A | Discharge status: Home / Self Care | Payer: Medicaid Other | Attending: Neurosurgery | Admitting: Neurosurgery

## 2020-04-03 ENCOUNTER — Ambulatory Visit (HOSPITAL_COMMUNITY): Payer: Medicaid Other | Admitting: Anesthesiology

## 2020-04-03 ENCOUNTER — Encounter (HOSPITAL_COMMUNITY): Payer: Self-pay | Admitting: Neurosurgery

## 2020-04-03 ENCOUNTER — Other Ambulatory Visit: Payer: Self-pay

## 2020-04-03 DIAGNOSIS — F329 Major depressive disorder, single episode, unspecified: Secondary | ICD-10-CM | POA: Diagnosis not present

## 2020-04-03 DIAGNOSIS — Z8619 Personal history of other infectious and parasitic diseases: Secondary | ICD-10-CM | POA: Diagnosis not present

## 2020-04-03 DIAGNOSIS — F419 Anxiety disorder, unspecified: Secondary | ICD-10-CM | POA: Diagnosis not present

## 2020-04-03 DIAGNOSIS — M5117 Intervertebral disc disorders with radiculopathy, lumbosacral region: Secondary | ICD-10-CM | POA: Diagnosis not present

## 2020-04-03 DIAGNOSIS — E785 Hyperlipidemia, unspecified: Secondary | ICD-10-CM | POA: Diagnosis not present

## 2020-04-03 DIAGNOSIS — Z6841 Body Mass Index (BMI) 40.0 and over, adult: Secondary | ICD-10-CM | POA: Insufficient documentation

## 2020-04-03 DIAGNOSIS — Z8249 Family history of ischemic heart disease and other diseases of the circulatory system: Secondary | ICD-10-CM | POA: Diagnosis not present

## 2020-04-03 DIAGNOSIS — Z8611 Personal history of tuberculosis: Secondary | ICD-10-CM | POA: Diagnosis not present

## 2020-04-03 DIAGNOSIS — G4700 Insomnia, unspecified: Secondary | ICD-10-CM | POA: Insufficient documentation

## 2020-04-03 DIAGNOSIS — D649 Anemia, unspecified: Secondary | ICD-10-CM | POA: Insufficient documentation

## 2020-04-03 DIAGNOSIS — E119 Type 2 diabetes mellitus without complications: Secondary | ICD-10-CM | POA: Insufficient documentation

## 2020-04-03 DIAGNOSIS — Z8379 Family history of other diseases of the digestive system: Secondary | ICD-10-CM | POA: Insufficient documentation

## 2020-04-03 DIAGNOSIS — Z87891 Personal history of nicotine dependence: Secondary | ICD-10-CM | POA: Diagnosis not present

## 2020-04-03 DIAGNOSIS — I10 Essential (primary) hypertension: Secondary | ICD-10-CM | POA: Diagnosis not present

## 2020-04-03 DIAGNOSIS — R519 Headache, unspecified: Secondary | ICD-10-CM | POA: Diagnosis not present

## 2020-04-03 DIAGNOSIS — J449 Chronic obstructive pulmonary disease, unspecified: Secondary | ICD-10-CM | POA: Diagnosis not present

## 2020-04-03 DIAGNOSIS — M5416 Radiculopathy, lumbar region: Secondary | ICD-10-CM | POA: Diagnosis present

## 2020-04-03 DIAGNOSIS — K219 Gastro-esophageal reflux disease without esophagitis: Secondary | ICD-10-CM | POA: Insufficient documentation

## 2020-04-03 DIAGNOSIS — Z419 Encounter for procedure for purposes other than remedying health state, unspecified: Secondary | ICD-10-CM

## 2020-04-03 DIAGNOSIS — Z981 Arthrodesis status: Secondary | ICD-10-CM | POA: Insufficient documentation

## 2020-04-03 DIAGNOSIS — M4807 Spinal stenosis, lumbosacral region: Secondary | ICD-10-CM | POA: Insufficient documentation

## 2020-04-03 DIAGNOSIS — Z794 Long term (current) use of insulin: Secondary | ICD-10-CM | POA: Insufficient documentation

## 2020-04-03 DIAGNOSIS — M1990 Unspecified osteoarthritis, unspecified site: Secondary | ICD-10-CM | POA: Diagnosis not present

## 2020-04-03 DIAGNOSIS — Z79899 Other long term (current) drug therapy: Secondary | ICD-10-CM | POA: Insufficient documentation

## 2020-04-03 DIAGNOSIS — Z833 Family history of diabetes mellitus: Secondary | ICD-10-CM | POA: Insufficient documentation

## 2020-04-03 DIAGNOSIS — Z9049 Acquired absence of other specified parts of digestive tract: Secondary | ICD-10-CM | POA: Diagnosis not present

## 2020-04-03 DIAGNOSIS — Z791 Long term (current) use of non-steroidal anti-inflammatories (NSAID): Secondary | ICD-10-CM | POA: Insufficient documentation

## 2020-04-03 LAB — GLUCOSE, CAPILLARY
Glucose-Capillary: 172 mg/dL — ABNORMAL HIGH (ref 70–99)
Glucose-Capillary: 166 mg/dL — ABNORMAL HIGH (ref 70–99)
Glucose-Capillary: 187 mg/dL — ABNORMAL HIGH (ref 70–99)
Glucose-Capillary: 314 mg/dL — ABNORMAL HIGH (ref 70–99)

## 2020-04-03 SURGERY — POSTERIOR LUMBAR FUSION 1 LEVEL
Anesthesia: General | Site: Spine Lumbar

## 2020-04-03 MED ORDER — OMEGA-3-ACID ETHYL ESTERS 1 G PO CAPS
1.0000 g | ORAL_CAPSULE | Freq: Every day | ORAL | Status: DC
  Filled 2020-04-03: qty 1

## 2020-04-03 MED ORDER — LIDOCAINE 2% (20 MG/ML) 5 ML SYRINGE
INTRAMUSCULAR | Status: DC | PRN
  Administered 2020-04-03: 100 mg via INTRAVENOUS

## 2020-04-03 MED ORDER — SEMAGLUTIDE(0.25 OR 0.5MG/DOS) 2 MG/1.5ML ~~LOC~~ SOPN: 0.5000 mg | PEN_INJECTOR | SUBCUTANEOUS | Status: DC

## 2020-04-03 MED ORDER — HYDROMORPHONE HCL 1 MG/ML IJ SOLN: 1.0000 mg | INTRAMUSCULAR | Status: DC | PRN

## 2020-04-03 MED ORDER — ATORVASTATIN CALCIUM 10 MG PO TABS: 10.0000 mg | ORAL_TABLET | Freq: Every day | ORAL | Status: DC

## 2020-04-03 MED ORDER — INSULIN ASPART 100 UNIT/ML ~~LOC~~ SOLN
18.0000 [IU] | Freq: Three times a day (TID) | SUBCUTANEOUS | Status: DC
  Administered 2020-04-03 – 2020-04-04 (×2): 18 [IU] via SUBCUTANEOUS

## 2020-04-03 MED ORDER — EPHEDRINE 5 MG/ML INJ
INTRAVENOUS | Status: AC
  Filled 2020-04-03: qty 10

## 2020-04-03 MED ORDER — PROPOFOL 10 MG/ML IV BOLUS
INTRAVENOUS | Status: AC
  Filled 2020-04-03: qty 40

## 2020-04-03 MED ORDER — PROPOFOL 10 MG/ML IV BOLUS
INTRAVENOUS | Status: DC | PRN
  Administered 2020-04-03: 30 mg via INTRAVENOUS
  Administered 2020-04-03: 170 mg via INTRAVENOUS

## 2020-04-03 MED ORDER — 0.9 % SODIUM CHLORIDE (POUR BTL) OPTIME
TOPICAL | Status: DC | PRN
  Administered 2020-04-03: 1000 mL

## 2020-04-03 MED ORDER — INSULIN ASPART 100 UNIT/ML ~~LOC~~ SOLN: 0.0000 [IU] | Freq: Three times a day (TID) | SUBCUTANEOUS | Status: DC

## 2020-04-03 MED ORDER — MIDAZOLAM HCL 2 MG/2ML IJ SOLN
INTRAMUSCULAR | Status: AC
  Filled 2020-04-03: qty 2

## 2020-04-03 MED ORDER — ONDANSETRON HCL 4 MG/2ML IJ SOLN
INTRAMUSCULAR | Status: AC
  Filled 2020-04-03: qty 2

## 2020-04-03 MED ORDER — LIDOCAINE 2% (20 MG/ML) 5 ML SYRINGE
INTRAMUSCULAR | Status: AC
  Filled 2020-04-03: qty 5

## 2020-04-03 MED ORDER — FLEET ENEMA 7-19 GM/118ML RE ENEM: 1.0000 | ENEMA | Freq: Once | RECTAL | Status: DC | PRN

## 2020-04-03 MED ORDER — MIDAZOLAM HCL 2 MG/2ML IJ SOLN
INTRAMUSCULAR | Status: DC | PRN
  Administered 2020-04-03: 2 mg via INTRAVENOUS

## 2020-04-03 MED ORDER — DIAZEPAM 5 MG PO TABS
5.0000 mg | ORAL_TABLET | Freq: Four times a day (QID) | ORAL | Status: DC | PRN
  Administered 2020-04-03 – 2020-04-04 (×3): 5 mg via ORAL
  Filled 2020-04-03 (×3): qty 1

## 2020-04-03 MED ORDER — KETOROLAC TROMETHAMINE 30 MG/ML IJ SOLN
INTRAMUSCULAR | Status: AC
  Filled 2020-04-03: qty 1

## 2020-04-03 MED ORDER — PHENOL 1.4 % MT LIQD: 1.0000 | OROMUCOSAL | Status: DC | PRN

## 2020-04-03 MED ORDER — PHENYLEPHRINE HCL-NACL 10-0.9 MG/250ML-% IV SOLN
INTRAVENOUS | Status: DC | PRN
Start: 2020-04-03 — End: 2020-04-03
  Administered 2020-04-03: 25 ug/min via INTRAVENOUS

## 2020-04-03 MED ORDER — OXYCODONE HCL 5 MG PO TABS
10.0000 mg | ORAL_TABLET | ORAL | Status: DC | PRN
  Administered 2020-04-03 – 2020-04-04 (×6): 10 mg via ORAL
  Filled 2020-04-03 (×6): qty 2

## 2020-04-03 MED ORDER — CHLORHEXIDINE GLUCONATE CLOTH 2 % EX PADS: 6.0000 | MEDICATED_PAD | Freq: Once | CUTANEOUS | Status: DC

## 2020-04-03 MED ORDER — VANCOMYCIN HCL 1000 MG IV SOLR
INTRAVENOUS | Status: DC | PRN
  Administered 2020-04-03: 1000 mg via TOPICAL

## 2020-04-03 MED ORDER — DEXAMETHASONE SODIUM PHOSPHATE 10 MG/ML IJ SOLN
10.0000 mg | Freq: Once | INTRAMUSCULAR | Status: AC
  Administered 2020-04-03: 10 mg via INTRAVENOUS
  Filled 2020-04-03: qty 1

## 2020-04-03 MED ORDER — BUPIVACAINE HCL (PF) 0.25 % IJ SOLN
INTRAMUSCULAR | Status: DC | PRN
  Administered 2020-04-03: 20 mL

## 2020-04-03 MED ORDER — VITAMIN E 180 MG (400 UNIT) PO CAPS
400.0000 [IU] | ORAL_CAPSULE | Freq: Every day | ORAL | Status: DC
  Filled 2020-04-03: qty 1

## 2020-04-03 MED ORDER — MENTHOL 3 MG MT LOZG
1.0000 | LOZENGE | OROMUCOSAL | Status: DC | PRN
  Filled 2020-04-03: qty 9

## 2020-04-03 MED ORDER — LINACLOTIDE 145 MCG PO CAPS
145.0000 ug | ORAL_CAPSULE | Freq: Every day | ORAL | Status: DC
  Filled 2020-04-03: qty 1

## 2020-04-03 MED ORDER — ACETAMINOPHEN 650 MG RE SUPP: 650.0000 mg | RECTAL | Status: DC | PRN

## 2020-04-03 MED ORDER — BUPROPION HCL ER (XL) 300 MG PO TB24
300.0000 mg | ORAL_TABLET | Freq: Every day | ORAL | Status: DC
  Administered 2020-04-04: 300 mg via ORAL
  Filled 2020-04-03: qty 1

## 2020-04-03 MED ORDER — OXYCODONE HCL 5 MG/5ML PO SOLN: 5.0000 mg | Freq: Once | ORAL | Status: DC | PRN

## 2020-04-03 MED ORDER — TRAZODONE HCL 150 MG PO TABS
150.0000 mg | ORAL_TABLET | Freq: Every day | ORAL | Status: DC
  Administered 2020-04-03: 150 mg via ORAL
  Filled 2020-04-03: qty 1

## 2020-04-03 MED ORDER — OMEGA 3 1200 MG PO CAPS: 1200.0000 mg | ORAL_CAPSULE | Freq: Every day | ORAL | Status: DC

## 2020-04-03 MED ORDER — ORAL CARE MOUTH RINSE: 15.0000 mL | Freq: Once | OROMUCOSAL | Status: AC

## 2020-04-03 MED ORDER — BISACODYL 10 MG RE SUPP: 10.0000 mg | Freq: Every day | RECTAL | Status: DC | PRN

## 2020-04-03 MED ORDER — FENTANYL CITRATE (PF) 100 MCG/2ML IJ SOLN
INTRAMUSCULAR | Status: DC | PRN
  Administered 2020-04-03: 50 ug via INTRAVENOUS
  Administered 2020-04-03: 100 ug via INTRAVENOUS
  Administered 2020-04-03 (×2): 50 ug via INTRAVENOUS

## 2020-04-03 MED ORDER — OXYCODONE HCL 5 MG PO TABS: 5.0000 mg | ORAL_TABLET | Freq: Once | ORAL | Status: DC | PRN

## 2020-04-03 MED ORDER — VITAMIN D 50 MCG (2000 UT) PO CAPS: 2000.0000 [IU] | ORAL_CAPSULE | Freq: Every day | ORAL | Status: DC

## 2020-04-03 MED ORDER — PANTOPRAZOLE SODIUM 40 MG PO TBEC
40.0000 mg | DELAYED_RELEASE_TABLET | Freq: Every day | ORAL | Status: DC
  Administered 2020-04-04: 40 mg via ORAL
  Filled 2020-04-03: qty 1

## 2020-04-03 MED ORDER — THROMBIN 20000 UNITS EX SOLR: CUTANEOUS | Status: DC | PRN

## 2020-04-03 MED ORDER — SODIUM CHLORIDE 0.9% FLUSH: 3.0000 mL | Freq: Two times a day (BID) | INTRAVENOUS | Status: DC

## 2020-04-03 MED ORDER — INSULIN DETEMIR 100 UNIT/ML ~~LOC~~ SOLN
30.0000 [IU] | Freq: Every day | SUBCUTANEOUS | Status: DC
  Administered 2020-04-03: 30 [IU] via SUBCUTANEOUS
  Filled 2020-04-03 (×2): qty 0.3

## 2020-04-03 MED ORDER — ROCURONIUM BROMIDE 10 MG/ML (PF) SYRINGE
PREFILLED_SYRINGE | INTRAVENOUS | Status: DC | PRN
  Administered 2020-04-03: 10 mg via INTRAVENOUS
  Administered 2020-04-03: 60 mg via INTRAVENOUS
  Administered 2020-04-03: 10 mg via INTRAVENOUS

## 2020-04-03 MED ORDER — ACETAMINOPHEN 500 MG PO TABS: 1000.0000 mg | ORAL_TABLET | Freq: Once | ORAL | Status: DC

## 2020-04-03 MED ORDER — SODIUM CHLORIDE 0.9 % IV SOLN: INTRAVENOUS | Status: DC | PRN

## 2020-04-03 MED ORDER — FENTANYL CITRATE (PF) 100 MCG/2ML IJ SOLN
INTRAMUSCULAR | Status: AC
  Filled 2020-04-03: qty 2

## 2020-04-03 MED ORDER — CHLORHEXIDINE GLUCONATE 0.12 % MT SOLN
15.0000 mL | Freq: Once | OROMUCOSAL | Status: AC
  Administered 2020-04-03: 15 mL via OROMUCOSAL
  Filled 2020-04-03: qty 15

## 2020-04-03 MED ORDER — DEXAMETHASONE SODIUM PHOSPHATE 10 MG/ML IJ SOLN
INTRAMUSCULAR | Status: AC
  Filled 2020-04-03: qty 1

## 2020-04-03 MED ORDER — PHENYLEPHRINE 40 MCG/ML (10ML) SYRINGE FOR IV PUSH (FOR BLOOD PRESSURE SUPPORT)
PREFILLED_SYRINGE | INTRAVENOUS | Status: AC
  Filled 2020-04-03: qty 10

## 2020-04-03 MED ORDER — HYDROCODONE-ACETAMINOPHEN 10-325 MG PO TABS: 1.0000 | ORAL_TABLET | ORAL | Status: DC | PRN

## 2020-04-03 MED ORDER — SODIUM CHLORIDE 0.9 % IV SOLN
250.0000 mL | INTRAVENOUS | Status: DC
  Administered 2020-04-03: 250 mL via INTRAVENOUS

## 2020-04-03 MED ORDER — THROMBIN 20000 UNITS EX SOLR
CUTANEOUS | Status: AC
  Filled 2020-04-03: qty 20000

## 2020-04-03 MED ORDER — LACTATED RINGERS IV SOLN: INTRAVENOUS | Status: DC

## 2020-04-03 MED ORDER — CEFAZOLIN SODIUM-DEXTROSE 2-4 GM/100ML-% IV SOLN
2.0000 g | INTRAVENOUS | Status: AC
  Administered 2020-04-03: 2 g via INTRAVENOUS
  Filled 2020-04-03: qty 100

## 2020-04-03 MED ORDER — PHENYLEPHRINE 40 MCG/ML (10ML) SYRINGE FOR IV PUSH (FOR BLOOD PRESSURE SUPPORT)
PREFILLED_SYRINGE | INTRAVENOUS | Status: DC | PRN
  Administered 2020-04-03: 80 ug via INTRAVENOUS

## 2020-04-03 MED ORDER — SPIRONOLACTONE 25 MG PO TABS
50.0000 mg | ORAL_TABLET | Freq: Every day | ORAL | Status: DC
  Filled 2020-04-03: qty 2

## 2020-04-03 MED ORDER — ONDANSETRON HCL 4 MG/2ML IJ SOLN: 4.0000 mg | Freq: Four times a day (QID) | INTRAMUSCULAR | Status: DC | PRN

## 2020-04-03 MED ORDER — ACETAMINOPHEN 325 MG PO TABS
650.0000 mg | ORAL_TABLET | ORAL | Status: DC | PRN
  Administered 2020-04-03 – 2020-04-04 (×2): 650 mg via ORAL
  Filled 2020-04-03 (×3): qty 2

## 2020-04-03 MED ORDER — SODIUM CHLORIDE 0.9% FLUSH: 3.0000 mL | INTRAVENOUS | Status: DC | PRN

## 2020-04-03 MED ORDER — FENTANYL CITRATE (PF) 100 MCG/2ML IJ SOLN
25.0000 ug | INTRAMUSCULAR | Status: DC | PRN
  Administered 2020-04-03 (×3): 50 ug via INTRAVENOUS

## 2020-04-03 MED ORDER — ARIPIPRAZOLE 10 MG PO TABS
10.0000 mg | ORAL_TABLET | Freq: Every day | ORAL | Status: DC
  Filled 2020-04-03: qty 1

## 2020-04-03 MED ORDER — ONDANSETRON HCL 4 MG PO TABS: 4.0000 mg | ORAL_TABLET | Freq: Four times a day (QID) | ORAL | Status: DC | PRN

## 2020-04-03 MED ORDER — SUGAMMADEX SODIUM 200 MG/2ML IV SOLN
INTRAVENOUS | Status: DC | PRN
  Administered 2020-04-03: 200 mg via INTRAVENOUS

## 2020-04-03 MED ORDER — CEFAZOLIN SODIUM-DEXTROSE 1-4 GM/50ML-% IV SOLN
1.0000 g | Freq: Three times a day (TID) | INTRAVENOUS | Status: AC
  Administered 2020-04-03 – 2020-04-04 (×2): 1 g via INTRAVENOUS
  Filled 2020-04-03 (×2): qty 50

## 2020-04-03 MED ORDER — POLYETHYLENE GLYCOL 3350 17 G PO PACK: 17.0000 g | PACK | Freq: Every day | ORAL | Status: DC | PRN

## 2020-04-03 MED ORDER — ONDANSETRON HCL 4 MG/2ML IJ SOLN
INTRAMUSCULAR | Status: DC | PRN
  Administered 2020-04-03: 4 mg via INTRAVENOUS

## 2020-04-03 MED ORDER — ALBUTEROL SULFATE HFA 108 (90 BASE) MCG/ACT IN AERS: 2.0000 | INHALATION_SPRAY | Freq: Four times a day (QID) | RESPIRATORY_TRACT | Status: DC | PRN

## 2020-04-03 MED ORDER — FENTANYL CITRATE (PF) 250 MCG/5ML IJ SOLN
INTRAMUSCULAR | Status: AC
  Filled 2020-04-03: qty 5

## 2020-04-03 MED ORDER — GABAPENTIN 300 MG PO CAPS
300.0000 mg | ORAL_CAPSULE | Freq: Three times a day (TID) | ORAL | Status: DC | PRN
  Administered 2020-04-04: 300 mg via ORAL
  Filled 2020-04-03: qty 1

## 2020-04-03 MED ORDER — VANCOMYCIN HCL 1000 MG IV SOLR
INTRAVENOUS | Status: AC
  Filled 2020-04-03: qty 1000

## 2020-04-03 MED ORDER — ROCURONIUM BROMIDE 10 MG/ML (PF) SYRINGE
PREFILLED_SYRINGE | INTRAVENOUS | Status: AC
  Filled 2020-04-03: qty 10

## 2020-04-03 MED ORDER — LISINOPRIL 10 MG PO TABS
10.0000 mg | ORAL_TABLET | Freq: Every day | ORAL | Status: DC
  Administered 2020-04-03: 10 mg via ORAL
  Filled 2020-04-03: qty 1

## 2020-04-03 MED ORDER — BUPIVACAINE HCL (PF) 0.25 % IJ SOLN
INTRAMUSCULAR | Status: AC
  Filled 2020-04-03: qty 30

## 2020-04-03 MED ORDER — PROMETHAZINE HCL 25 MG/ML IJ SOLN: 6.2500 mg | INTRAMUSCULAR | Status: DC | PRN

## 2020-04-03 SURGICAL SUPPLY — 62 items
ADH SKN CLS APL DERMABOND .7 (GAUZE/BANDAGES/DRESSINGS) ×1
APL SKNCLS STERI-STRIP NONHPOA (GAUZE/BANDAGES/DRESSINGS) ×1
BAG DECANTER FOR FLEXI CONT (MISCELLANEOUS) ×2 IMPLANT
BENZOIN TINCTURE PRP APPL 2/3 (GAUZE/BANDAGES/DRESSINGS) ×2 IMPLANT
BUR MATCHSTICK NEURO 3.0 LAGG (BURR) ×2 IMPLANT
CANISTER SUCT 3000ML PPV (MISCELLANEOUS) ×2 IMPLANT
CAP LCK SPNE (Orthopedic Implant) ×4 IMPLANT
CAP LOCK SPINE RADIUS (Orthopedic Implant) IMPLANT
CAP LOCKING (Orthopedic Implant) ×8 IMPLANT
CARTRIDGE OIL MAESTRO DRILL (MISCELLANEOUS) ×1 IMPLANT
CNTNR URN SCR LID CUP LEK RST (MISCELLANEOUS) ×1 IMPLANT
CONT SPEC 4OZ STRL OR WHT (MISCELLANEOUS) ×2
COVER BACK TABLE 60X90IN (DRAPES) ×2 IMPLANT
DERMABOND ADVANCED (GAUZE/BANDAGES/DRESSINGS) ×1
DERMABOND ADVANCED .7 DNX12 (GAUZE/BANDAGES/DRESSINGS) ×1 IMPLANT
DEVICE INTERBODY ELEVATE 9X23 (Cage) ×2 IMPLANT
DIFFUSER DRILL AIR PNEUMATIC (MISCELLANEOUS) ×2 IMPLANT
DRAPE C-ARM 42X72 X-RAY (DRAPES) ×4 IMPLANT
DRAPE HALF SHEET 40X57 (DRAPES) IMPLANT
DRAPE LAPAROTOMY 100X72X124 (DRAPES) ×2 IMPLANT
DRAPE SURG 17X23 STRL (DRAPES) ×8 IMPLANT
DRSG OPSITE POSTOP 4X6 (GAUZE/BANDAGES/DRESSINGS) ×2 IMPLANT
DRSG OPSITE POSTOP 4X8 (GAUZE/BANDAGES/DRESSINGS) ×1 IMPLANT
DURAPREP 26ML APPLICATOR (WOUND CARE) ×2 IMPLANT
ELECT REM PT RETURN 9FT ADLT (ELECTROSURGICAL) ×2
ELECTRODE REM PT RTRN 9FT ADLT (ELECTROSURGICAL) ×1 IMPLANT
EVACUATOR 1/8 PVC DRAIN (DRAIN) IMPLANT
GAUZE 4X4 16PLY RFD (DISPOSABLE) IMPLANT
GAUZE SPONGE 4X4 12PLY STRL (GAUZE/BANDAGES/DRESSINGS) IMPLANT
GLOVE BIO SURGEON STRL SZ 6.5 (GLOVE) ×2 IMPLANT
GLOVE BIOGEL PI IND STRL 6.5 (GLOVE) ×1 IMPLANT
GLOVE BIOGEL PI IND STRL 7.5 (GLOVE) IMPLANT
GLOVE BIOGEL PI INDICATOR 6.5 (GLOVE) ×1
GLOVE BIOGEL PI INDICATOR 7.5 (GLOVE) ×4
GLOVE ECLIPSE 9.0 STRL (GLOVE) ×4 IMPLANT
GLOVE EXAM NITRILE XL STR (GLOVE) IMPLANT
GOWN STRL REUS W/ TWL LRG LVL3 (GOWN DISPOSABLE) IMPLANT
GOWN STRL REUS W/ TWL XL LVL3 (GOWN DISPOSABLE) ×2 IMPLANT
GOWN STRL REUS W/TWL 2XL LVL3 (GOWN DISPOSABLE) IMPLANT
GOWN STRL REUS W/TWL LRG LVL3 (GOWN DISPOSABLE) ×4
GOWN STRL REUS W/TWL XL LVL3 (GOWN DISPOSABLE) ×4
KIT BASIN OR (CUSTOM PROCEDURE TRAY) ×2 IMPLANT
KIT TURNOVER KIT B (KITS) ×2 IMPLANT
MILL MEDIUM DISP (BLADE) ×2 IMPLANT
NEEDLE HYPO 22GX1.5 SAFETY (NEEDLE) ×2 IMPLANT
NS IRRIG 1000ML POUR BTL (IV SOLUTION) ×2 IMPLANT
OIL CARTRIDGE MAESTRO DRILL (MISCELLANEOUS) ×2
PACK LAMINECTOMY NEURO (CUSTOM PROCEDURE TRAY) ×2 IMPLANT
ROD RADIUS 35MM (Rod) ×1 IMPLANT
ROD RADIUS 40MM (Neuro Prosthesis/Implant) ×2 IMPLANT
ROD SPNL 40X5.5XNS TI RDS (Neuro Prosthesis/Implant) IMPLANT
SCREW 5.75X40M (Screw) ×2 IMPLANT
SPONGE SURGIFOAM ABS GEL 100 (HEMOSTASIS) ×2 IMPLANT
STRIP CLOSURE SKIN 1/2X4 (GAUZE/BANDAGES/DRESSINGS) ×3 IMPLANT
SUT VIC AB 0 CT1 18XCR BRD8 (SUTURE) ×2 IMPLANT
SUT VIC AB 0 CT1 8-18 (SUTURE) ×2
SUT VIC AB 2-0 CT1 18 (SUTURE) ×2 IMPLANT
SUT VIC AB 3-0 SH 8-18 (SUTURE) ×4 IMPLANT
TOWEL GREEN STERILE (TOWEL DISPOSABLE) ×2 IMPLANT
TOWEL GREEN STERILE FF (TOWEL DISPOSABLE) ×2 IMPLANT
TRAY FOLEY MTR SLVR 16FR STAT (SET/KITS/TRAYS/PACK) ×2 IMPLANT
WATER STERILE IRR 1000ML POUR (IV SOLUTION) ×2 IMPLANT

## 2020-04-03 NOTE — Transfer of Care (Signed)
Immediate Anesthesia Transfer of Care Note  Patient: Kara Kelley  Procedure(s) Performed: LUMBAR FIVE-SACRAL ONE POSTERIOR LUMBAR INTERBODY FUSION (N/A Spine Lumbar)  Patient Location: PACU  Anesthesia Type:General  Level of Consciousness: awake, alert , oriented and patient cooperative  Airway & Oxygen Therapy: Patient Spontanous Breathing and Patient connected to nasal cannula oxygen  Post-op Assessment: Report given to RN and Post -op Vital signs reviewed and stable  Post vital signs: Reviewed and stable  Last Vitals:  Vitals Value Taken Time  BP 166/89 04/03/20 1451  Temp    Pulse 91 04/03/20 1452  Resp 16 04/03/20 1452  SpO2 95 % 04/03/20 1452  Vitals shown include unvalidated device data.  Last Pain:  Vitals:   04/03/20 0933  TempSrc:   PainSc: 10-Worst pain ever         Complications: No complications documented.

## 2020-04-03 NOTE — Chronic Care Management (AMB) (Signed)
Care Management   Follow Up Note   04/02/2020 Name: Kara Kelley MRN: [id number] DOB: [birth year]  Referred by: [name], FNP Reason for referral : Chronic Care Management ([facility] RN CC Call - pre-surgery for lumbar fusion )   Kara Kelley is a 64 y.o. year old female who is a primary care patient of [name], [place]. The CCM team was consulted for assistance with chronic disease management and care coordination needs.    Review of patient status, including review of consultants reports, relevant laboratory and other test results, and collaboration with appropriate care team members and the patient's provider was performed as part of comprehensive patient evaluation and provision of chronic care management services.    SDOH (Social Determinants of Health) assessments performed: Yes - no acute needs See Care Plan activities for detailed interventions related to [place])   Placed outbound CCM RN CM call to patient for an update related her planned admission for back surgery.     No facility-administered encounter medications on file as of 04/01/2020.   Outpatient Encounter Medications as of 04/01/2020  Medication Sig  . Accu-Chek FastClix Lancets MISC USE TO CHECK BLOOD GLUCOSE THREE TIMES DAILY  . ACCU-CHEK GUIDE test strip USE TO TEST FOUR TIMES DAILY  . albuterol (PROVENTIL HFA;VENTOLIN HFA) 108 (90 Base) MCG/ACT inhaler Inhale 2 puffs into the lungs every 6 (six) hours as needed. For shortness of breath. (Patient taking differently: Inhale 2 puffs into the lungs every 6 (six) hours as needed for wheezing or shortness of breath. )  . ARIPiprazole (ABILIFY) 10 MG tablet Take 10 mg by mouth daily.   [name] atorvastatin (LIPITOR) 10 MG tablet Take 1 tablet (10 mg total) by mouth daily.  . Blood Glucose Monitoring Suppl (ACCU-CHEK NANO SMARTVIEW) w/Device KIT 1 each by Does not apply route 3 (three) times daily.  [name] buPROPion (WELLBUTRIN XL) 300 MG 24 hr tablet Take 300 mg by mouth daily.    . Cholecalciferol (VITAMIN D) 50 MCG (2000 UT) CAPS Take 2,000 Units by mouth daily.  . cyclobenzaprine (FLEXERIL) 5 MG tablet Take 1 tablet (5 mg total) by mouth 3 (three) times daily as needed for muscle spasms. (Patient not taking: Reported on 03/25/2020)  . diclofenac Sodium (VOLTAREN) 1 % GEL Apply 2 g topically 2 (two) times daily as needed (pain).  [name] EVENING PRIMROSE OIL PO Take 1,000 mg by mouth daily.  [name] gabapentin (NEURONTIN) 300 MG capsule Take 300 mg by mouth 3 (three) times daily as needed (pain).  . Insulin Pen Needle (PEN NEEDLES) 32G X 4 MM MISC 1 each by Does not apply route 3 (three) times daily.  [name] LEVEMIR FLEXTOUCH 100 UNIT/ML Pen INJECT 40 UNITS INTO SKIN AT BEDTIME (Patient taking differently: Inject 30 Units into the skin at bedtime. )  . lidocaine (LIDODERM) 5 % Place 1 patch onto the skin daily. Remove & Discard patch within 12 hours or as directed by MD (Patient taking differently: Place 1 patch onto the skin daily as needed (pain). Remove & Discard patch within 12 hours or as directed by MD)  . LINZESS 145 MCG CAPS capsule Take 145 mcg by mouth daily before breakfast.   . liraglutide (VICTOZA) 18 MG/3ML SOPN Inject 0.3 mLs (1.8 mg total) into the skin daily. (Patient not taking: Reported on 03/25/2020)  . lisinopril (ZESTRIL) 10 MG tablet TAKE 1 TABLET(10 MG) BY MOUTH DAILY (Patient taking differently: Take 10 mg by mouth daily. )  . Magnesium 200 MG TABS  Take one tablet by mouth with evening meal. (Patient not taking: Reported on 12/19/2019)  . meloxicam (MOBIC) 15 MG tablet Take 1 tablet (15 mg total) by mouth daily. (Patient not taking: Reported on 03/25/2020)  . metFORMIN (GLUCOPHAGE) 500 MG tablet TAKE 1 TABLET(500 MG) BY MOUTH TWICE DAILY WITH A MEAL (Patient not taking: Reported on 03/19/2020)  . NOVOLOG FLEXPEN 100 UNIT/ML FlexPen ADMINISTER 18 UNITS UNDER THE SKIN THREE TIMES DAILY AS NEEDED FOR HIGH BLOOD SUGAR (Patient taking differently: Inject 18 Units into the skin  3 (three) times daily with meals. )  . Omega 3 1200 MG CAPS Take 1,200 mg by mouth daily.  [name] omeprazole (PRILOSEC) 40 MG capsule TAKE 1 CAPSULE(40 MG) BY MOUTH DAILY BEFORE BREAKFAST (Patient taking differently: Take 40 mg by mouth daily before breakfast. )  . promethazine (PHENERGAN) 25 MG tablet Take 1 tablet (25 mg total) by mouth every 8 (eight) hours as needed for nausea or vomiting. (Patient not taking: Reported on 12/19/2019)  . Semaglutide,0.25 or 0.5MG/DOS, (OZEMPIC, 0.25 OR 0.5 MG/DOSE,) 2 MG/1.5ML SOPN Inject 0.5 mg into the skin once a week.  . spironolactone (ALDACTONE) 50 MG tablet TAKE 1 TABLET(50 MG) BY MOUTH DAILY (Patient taking differently: Take 50 mg by mouth daily. )  . traZODone (DESYREL) 150 MG tablet Take 150 mg by mouth at bedtime.   . vitamin E 180 MG (400 UNITS) capsule Take 400 Units by mouth daily.     Objective:  Lab Results  Component Value Date   HGBA1C 7.2 (H) 03/12/2020   HGBA1C 7.1 (H) 12/19/2019   HGBA1C 7.1 (H) 09/23/2019   Lab Results  Component Value Date   MICROALBUR 80 06/19/2019   LDLCALC 46 12/19/2019   CREATININE 1.24 (H) 03/31/2020   BP Readings from Last 3 Encounters:  04/03/20 (!) 149/79  03/31/20 (!) 150/82  03/12/20 130/80    Goals Addressed      Patient Stated   .  "I have to have back surgery" (pt-stated)        CARE PLAN ENTRY (see longitudinal plan of care for additional care plan information)  Current Barriers:  [name] Knowledge Deficits related to treatment for degenerative spondylosis  . Chronic Disease Management support and education needs related to Essential Hypertension, DMII, Degenerative Spondylosis, fall   Nurse Case Manager Clinical Goal(s):  [name] Over the next 30 days, patient will verbalize understanding of plan for treatment of chronic degenerative spondylosis with associated pain and recent fall   CCM RN CM Interventions:  04/02/20 call completed with patient  . Inter-disciplinary care team collaboration (see  longitudinal plan of care) . Evaluation of current treatment plan related to chronic degenerative spondylosis and patient's adherence to plan as established by provider . Determined patient's planned admission to [facility] on 04[facility]/20 to undergo a Posterior Lumbar Interbody Fusion to L5-S1 is still expected to happen . Determined patient understands her pre-operative instructions; Determined this is a planned overnight admission; Determined Ms. Buchanan's adult daughter's will be available to assist her as needed upon her discharge home . Discussed plans with patient for ongoing care management follow up and provided patient with direct contact information for care management team  Patient Self Care Activities:  . Self administers medications as prescribed . Attends all scheduled provider appointments . Calls pharmacy for medication refills . Calls provider office for new concerns or questions  Please see past updates related to this goal by clicking on the "Past Updates" button in the selected  goal        Plan:   Telephone follow up appointment with care management team member scheduled for: 04/06/20  Barb Merino, RN, BSN, CCM Care Management Coordinator [facility]  Direct Phone: [phone]

## 2020-04-03 NOTE — Progress Notes (Signed)
Orthopedic Tech Progress Note Patient Details:  Kara Kelley 1956/04/27 [id number] Called in order to [facility] for an ASPEN LUMBAR BRACE Patient ID: Kara Kelley, female   DOB: 1955/11/21, 64 y.o.   MRN: [id number]   [name] 04/03/2020, 3:25 PM

## 2020-04-03 NOTE — Anesthesia Procedure Notes (Signed)
Procedure Name: Intubation Date/Time: 04/03/2020 12:01 PM Performed by: Leonor Liv, CRNA Pre-anesthesia Checklist: Patient identified, Emergency Drugs available, Suction available and Patient being monitored Patient Re-evaluated:Patient Re-evaluated prior to induction Oxygen Delivery Method: Circle System Utilized Preoxygenation: Pre-oxygenation with 100% oxygen Induction Type: IV induction Ventilation: Mask ventilation without difficulty and Oral airway inserted - appropriate to patient size Laryngoscope Size: Mac and 3 Grade View: Grade I Tube type: Oral Tube size: 7.0 mm Number of attempts: 1 Airway Equipment and Method: Stylet and Oral airway Placement Confirmation: ETT inserted through vocal cords under direct vision,  positive ETCO2 and breath sounds checked- equal and bilateral Secured at: 21 cm Tube secured with: Tape Dental Injury: Teeth and Oropharynx as per pre-operative assessment

## 2020-04-03 NOTE — H&P (Signed)
Kara Kelley is an 64 y.o. female.   Chief Complaint: Back pain HPI: 64 year old female status post prior L4-5 decompression and fusion.  Patient with persistently worsening lower back pain with some intermittent radicular symptoms.  Work-up demonstrates evidence of progressive disc degeneration with some instability and severe facet arthropathy at L5-S1.  Patient presents now for lumbar decompression and fusion in hopes of improving her symptoms.  Past Medical History:  Diagnosis Date  . Anemia   . Anxiety   . Arthritis   . Asthma   . Bunion   . Callus   . Chronic pain   . Cocaine abuse ([place])   . COPD (chronic obstructive pulmonary disease) ([place])   . Corns and callosities   . Degenerative joint disease   . Depression   . Diabetes mellitus   . Endometrial polyp   . ETOH abuse   . Gall stones   . GERD (gastroesophageal reflux disease)   . Headache    history of Migraines  . Hepatitis C    Hep C  . Hyperlipidemia   . Hypertension   . Insomnia   . Spondylolisthesis of lumbar region   . Substance abuse ([place])    alcoholism  . Tuberculosis 1985  . Wears dentures   . Wears glasses     Past Surgical History:  Procedure Laterality Date  . BACK SURGERY  2018  . CESAREAN SECTION     x3  . CHOLECYSTECTOMY N/A 08/11/2016   Procedure: LAPAROSCOPIC CHOLECYSTECTOMY WITH   INTRAOPERATIVE CHOLANGIOGRAM;  Surgeon: Stark Klein, MD;  Location: [facility];  Service: General;  Laterality: N/A;  . COLONOSCOPY    . Cotton Osteotomy w/ Graft Left 06/18/2009  . Excision of Benign Lesion Right 01/30/2013   Rt Plantar  . FOOT SURGERY    . HAMMER TOE REPAIR Right 02/12/2016   RIGHT #5  . Hammertoe Repair Left 06/18/2009   Lt #5  . HYSTEROSCOPY N/A 06/20/2017   Procedure: DILATION AND CURETTAGE, HYSTEROSCOPY w/ Polypectomy;  Surgeon: Lavonia Drafts, MD;  Location: [facility];  Service: Gynecology;  Laterality: N/A;  . LUMBAR FUSION  2018  . METATARSAL OSTEOTOMY Left 06/18/2009   #5  .  MULTIPLE TOOTH EXTRACTIONS    . Nail Matrixectomy Left 06/18/2009   LT #1  . OSTEOTOMY Right 01/30/2013   Rt #5  . Phalangectomy Left 06/18/2009   LT #1  . Phalangectomy Right 01/30/2013   Rt #1  . TUBAL LIGATION      Family History  Problem Relation Age of Onset  . Heart disease Mother   . Diabetes Other        mat great aunt  . Cirrhosis Other        mat great aunt  . Cirrhosis Other        mat great uncles x 2  . Colon cancer Neg Hx   . Breast cancer Neg Hx    Social History:  reports that she quit smoking about 17 months ago. Her smoking use included cigarettes. She smoked 0.50 packs per day. She has never used smokeless tobacco. She reports that she does not drink alcohol and does not use drugs.  Allergies: No Known Allergies  Medications Prior to Admission  Medication Sig Dispense Refill  . ARIPiprazole (ABILIFY) 10 MG tablet Take 10 mg by mouth daily.     [name] buPROPion (WELLBUTRIN XL) 300 MG 24 hr tablet Take 300 mg by mouth daily.   0  . Cholecalciferol (VITAMIN D) 50  MCG (2000 UT) CAPS Take 2,000 Units by mouth daily.    [name] EVENING PRIMROSE OIL PO Take 1,000 mg by mouth daily.    [name] gabapentin (NEURONTIN) 300 MG capsule Take 300 mg by mouth 3 (three) times daily as needed (pain).    Ernest Mallick FLEXTOUCH 100 UNIT/ML Pen INJECT 40 UNITS INTO SKIN AT BEDTIME (Patient taking differently: Inject 30 Units into the skin at bedtime. ) 15 mL 1  . LINZESS 145 MCG CAPS capsule Take 145 mcg by mouth daily before breakfast.     . lisinopril (ZESTRIL) 10 MG tablet TAKE 1 TABLET(10 MG) BY MOUTH DAILY (Patient taking differently: Take 10 mg by mouth daily. ) 90 tablet 0  . NOVOLOG FLEXPEN 100 UNIT/ML FlexPen ADMINISTER 18 UNITS UNDER THE SKIN THREE TIMES DAILY AS NEEDED FOR HIGH BLOOD SUGAR (Patient taking differently: Inject 18 Units into the skin 3 (three) times daily with meals. ) 15 mL 0  . Omega 3 1200 MG CAPS Take 1,200 mg by mouth daily.    [name] omeprazole (PRILOSEC) 40 MG capsule TAKE 1  CAPSULE(40 MG) BY MOUTH DAILY BEFORE BREAKFAST (Patient taking differently: Take 40 mg by mouth daily before breakfast. ) 90 capsule 0  . Semaglutide,0.25 or 0.5MG/DOS, (OZEMPIC, 0.25 OR 0.5 MG/DOSE,) 2 MG/1.5ML SOPN Inject 0.5 mg into the skin once a week. 3 pen 1  . spironolactone (ALDACTONE) 50 MG tablet TAKE 1 TABLET(50 MG) BY MOUTH DAILY (Patient taking differently: Take 50 mg by mouth daily. ) 90 tablet 0  . traZODone (DESYREL) 150 MG tablet Take 150 mg by mouth at bedtime.     . vitamin E 180 MG (400 UNITS) capsule Take 400 Units by mouth daily.    . Accu-Chek FastClix Lancets MISC USE TO CHECK BLOOD GLUCOSE THREE TIMES DAILY 102 each prn  . ACCU-CHEK GUIDE test strip USE TO TEST FOUR TIMES DAILY 300 strip 2  . albuterol (PROVENTIL HFA;VENTOLIN HFA) 108 (90 Base) MCG/ACT inhaler Inhale 2 puffs into the lungs every 6 (six) hours as needed. For shortness of breath. (Patient taking differently: Inhale 2 puffs into the lungs every 6 (six) hours as needed for wheezing or shortness of breath. ) 18 g 1  . atorvastatin (LIPITOR) 10 MG tablet Take 1 tablet (10 mg total) by mouth daily. 90 tablet 0  . Blood Glucose Monitoring Suppl (ACCU-CHEK NANO SMARTVIEW) w/Device KIT 1 each by Does not apply route 3 (three) times daily. 1 kit 0  . cyclobenzaprine (FLEXERIL) 5 MG tablet Take 1 tablet (5 mg total) by mouth 3 (three) times daily as needed for muscle spasms. (Patient not taking: Reported on 03/25/2020) 30 tablet 1  . diclofenac Sodium (VOLTAREN) 1 % GEL Apply 2 g topically 2 (two) times daily as needed (pain).    . Insulin Pen Needle (PEN NEEDLES) 32G X 4 MM MISC 1 each by Does not apply route 3 (three) times daily. 200 each 3  . lidocaine (LIDODERM) 5 % Place 1 patch onto the skin daily. Remove & Discard patch within 12 hours or as directed by MD (Patient taking differently: Place 1 patch onto the skin daily as needed (pain). Remove & Discard patch within 12 hours or as directed by MD) 30 patch 0  .  liraglutide (VICTOZA) 18 MG/3ML SOPN Inject 0.3 mLs (1.8 mg total) into the skin daily. (Patient not taking: Reported on 03/25/2020) 9 pen 1  . Magnesium 200 MG TABS Take one tablet by mouth with evening meal. (Patient not taking:  Reported on 12/19/2019) 30 tablet 2  . meloxicam (MOBIC) 15 MG tablet Take 1 tablet (15 mg total) by mouth daily. (Patient not taking: Reported on 03/25/2020) 90 tablet 0  . metFORMIN (GLUCOPHAGE) 500 MG tablet TAKE 1 TABLET(500 MG) BY MOUTH TWICE DAILY WITH A MEAL (Patient not taking: Reported on 03/19/2020) 180 tablet 1  . promethazine (PHENERGAN) 25 MG tablet Take 1 tablet (25 mg total) by mouth every 8 (eight) hours as needed for nausea or vomiting. (Patient not taking: Reported on 12/19/2019) 20 tablet 0    Results for orders placed or performed during the hospital encounter of 04/03/20 (from the past 48 hour(s))  Glucose, capillary     Status: Abnormal   Collection Time: 04/03/20  9:17 AM  Result Value Ref Range   Glucose-Capillary 172 (H) 70 - 99 mg/dL    Comment: Glucose reference range applies only to samples taken after fasting for at least 8 hours.   No results found.  Pertinent items noted in HPI and remainder of comprehensive ROS otherwise negative.  Blood pressure 119/61, pulse 80, temperature 98.5 F (36.9 C), temperature source Tympanic, resp. rate 18, height _0  (1.499 m), weight 95.3 kg, SpO2 100 %.  Patient is awake and alert.  She is oriented and reasonably appropriate.  Cranial nerve function intact.  Motor and sensory function normal in both upper and lower extremities.  Reflexes normal active scepter Achilles reflexes are absent bilaterally.  Gait is antalgic.  Posture is normal.  Examination head ears eyes nose throat is unremarked.  Examination of the chest and abdomen is benign.  Extremities are free from injury or deformity. Assessment/Plan L5-S1 degenerative disc disease with facet arthropathy and foraminal stenosis with chronic back pain.   Plan bilateral L5-S1 decompressive laminotomies and foraminotomies followed by posterior lumbar interbody fusion utilizing interbody cages with local autograft and augmented with posterior lateral thesis fusion nonsegmental pedicle screw fixation.  Risks and benefits of been explained.  Patient wishes to proceed.  Mallie Mussel A Hodge Stachnik 04/03/2020, 11:39 AM

## 2020-04-03 NOTE — Anesthesia Preprocedure Evaluation (Addendum)
Anesthesia Evaluation  Patient identified by MRN, date of birth, ID band Patient awake    Reviewed: Allergy & Precautions, NPO status , Patient's Chart, lab work & pertinent test results  History of Anesthesia Complications Negative for: history of anesthetic complications  Airway Mallampati: II  TM Distance: >3 FB Neck ROM: Full    Dental  (+) Upper Dentures, Missing,    Pulmonary asthma , COPD, former smoker,    Pulmonary exam normal        Cardiovascular hypertension, Pt. on medications Normal cardiovascular exam     Neuro/Psych  Headaches, Anxiety Depression Lumbar stenosis    GI/Hepatic GERD  Medicated and Controlled,(+)     substance abuse (sober for >1 year (cocaine/EtOH))  , Hepatitis -, C  Endo/Other  diabetes, Type 2, Insulin DependentMorbid obesity  Renal/GU CRFRenal disease (Cr 1.24)  negative genitourinary   Musculoskeletal  (+) Arthritis ,   Abdominal   Peds  Hematology  (+) anemia , Hgb 11.5   Anesthesia Other Findings Day of surgery medications reviewed with patient.  Reproductive/Obstetrics negative OB ROS                            Anesthesia Physical Anesthesia Plan  ASA: III  Anesthesia Plan: General   Post-op Pain Management:    Induction: Intravenous  PONV Risk Score and Plan: 4 or greater and Treatment may vary due to age or medical condition, Ondansetron, Dexamethasone and Midazolam  Airway Management Planned: Oral ETT  Additional Equipment: None  Intra-op Plan:   Post-operative Plan: Extubation in OR  Informed Consent: I have reviewed the patients History and Physical, chart, labs and discussed the procedure including the risks, benefits and alternatives for the proposed anesthesia with the patient or authorized representative who has indicated his/her understanding and acceptance.     Dental advisory given  Plan Discussed with:  CRNA  Anesthesia Plan Comments:        Anesthesia Quick Evaluation

## 2020-04-03 NOTE — Anesthesia Postprocedure Evaluation (Signed)
Anesthesia Post Note  Patient: Kara Kelley  Procedure(s) Performed: LUMBAR FIVE-SACRAL ONE POSTERIOR LUMBAR INTERBODY FUSION (N/A Spine Lumbar)     Patient location during evaluation: PACU Anesthesia Type: General Level of consciousness: awake and alert and oriented Pain management: pain level controlled Vital Signs Assessment: post-procedure vital signs reviewed and stable Respiratory status: spontaneous breathing, nonlabored ventilation and respiratory function stable Cardiovascular status: blood pressure returned to baseline Postop Assessment: no apparent nausea or vomiting Anesthetic complications: no   No complications documented.  Last Vitals:  Vitals:   04/03/20 1524 04/03/20 1559  BP: 140/76 (!) 149/79  Pulse: 80 90  Resp: 20 19  Temp: 36.4 C 36.8 C  SpO2: 93%     Last Pain:  Vitals:   04/03/20 1524  TempSrc:   PainSc: 5                  Kaylyn Layer

## 2020-04-03 NOTE — Op Note (Signed)
Date of procedure: 04/03/2020  Date of dictation: Same  Service: Neurosurgery  Preoperative diagnosis: L5-S1 degenerative disc disease with severe facet arthropathy and marked foraminal stenosis with radiculopathy, adjacent level disease to lumbar fusion at L4-5.  Postoperative diagnosis: Same  Procedure Name: Bilateral L5-S1 decompressive laminotomies and foraminotomies, more than would be required for interbody fusion alone.  L5-S1 posterior lumbar interbody fusion utilizing interbody cages, locally harvested autograft  L5-S1 posterior lateral arthrodesis utilizing nonsegmental pedicle screw fixation and local autograft  Reexploration of L4-5 posterior lateral fusion with removal of hardware  Surgeon:Matson Welch A.Micheil Klaus, M.D.  Asst. Surgeon: Reinaldo Meeker, NP  Anesthesia: General  Indication: 64 year old female with remotely status post L4-5 decompression and fusion presents with worsening back and bilateral lower extremity symptoms failing conservative management.  Work-up demonstrates evidence of progressive disc degeneration with evidence of instability and severe facet arthropathy as well as marked foraminal stenosis at L5-S1.  Patient has failed conservative management she presents now for lumbar decompression and fusion in hopes of improving her symptoms.  Operative note: After induction of anesthesia, patient position prone on the Wilson frame and properly padded.  Lumbar region prepped and draped sterilely.  Incision made overlying L4-5 and S1.  Dissection performed bilaterally.  Retractor placed.  Fluoroscopy used.  Levels confirmed.  Previously placed pedicle screw instrumentation at L4-5 was dissected free, disassembled and the fusion was inspected.  Fusion was found to be solid at L4-5.  The L4 screws were removed.  The L5 screws were left in place.  Decompressive laminotomies and facetectomies were then performed bilaterally at L5-S1 to remove the inferior two thirds of the lamina of L5  the entire inferior facet and pars interarticularis of L5 and the majority of the superior articulating process of S1 as well as the S1 lamina.  Ligament flavum elevated and resected.  Foraminotomies completed on the course exiting L5 and S1 nerve roots bilaterally.  Bilateral discectomies then performed.  The space then prepared for interbody fusion.  With a distractor placed the patient's right side to space was cleaned of soft tissue.  9 mm extra lordotic interbody expandable cage from [employer] packed with locally harvested autograft was then impacted in place and then expanded to its full extent.  Distractor removed patient's right side.  Dissipates then prepared on the right side.  Soft tissue removed interspace.  Morselized autograft packed in the interspace.  Second cage packed with autograft was then impacted in place and expanded to its full extent.  Pedicles of S1 was identified using surface landmarks and intraoperative fluoroscopy and superficial bone around the pedicle was then removed using high-speed drill.  Pedicle was then probed using pedicle all each pedicle tract was then probed and found to be solidly within the bone.  Each pedicle tract was then tapped with a screw tap and then once again probed to confirm good trajectory.  5.75 mm radius brand screws from [employer] were placed bilaterally at S1.  Final images reveal good position of cages and the hardware at the proper upper level with normal alignment of the spine.  Short segment titanium rod placed through the screws at L5 and S1.  Locking caps placed over the screws and locking caps then engaged with a construct under compression.  Transverse processes and sacral ala were then decorticated.  Morselized autograft was packed posterior laterally.  Vancomycin powder was left in the deep wound space.  Wounds and closed in layers with Vicryl sutures.  Steri-Strips and sterile dressing were applied.  No apparent complications.  Patient  tolerated the procedure well and she returns to the recovery room postop.

## 2020-04-03 NOTE — Brief Op Note (Signed)
04/03/2020  2:35 PM  PATIENT:  Kara Kelley  64 y.o. female  PRE-OPERATIVE DIAGNOSIS:  Adjacent level disease - Stenosis  POST-OPERATIVE DIAGNOSIS:  Adjacent level disease - Stenosis  PROCEDURE:  Procedure(s): LUMBAR FIVE-SACRAL ONE POSTERIOR LUMBAR INTERBODY FUSION (N/A)  SURGEON:  Surgeon(s) and Role:    * Julio Sicks, MD - Primary  PHYSICIAN ASSISTANT:   ASSISTANTSMarland Mcalpine   ANESTHESIA:   general  EBL:  300 mL   BLOOD ADMINISTERED:none  DRAINS: none   LOCAL MEDICATIONS USED:  MARCAINE     SPECIMEN:  No Specimen  DISPOSITION OF SPECIMEN:  N/A  COUNTS:  YES  TOURNIQUET:  * No tourniquets in log *  DICTATION: .Dragon Dictation  PLAN OF CARE: Admit for overnight observation  PATIENT DISPOSITION:  PACU - hemodynamically stable.   Delay start of Pharmacological VTE agent (>24hrs) due to surgical blood loss or risk of bleeding: yes

## 2020-04-03 NOTE — Patient Instructions (Signed)
Visit Information  Goals Addressed      Patient Stated   .  "I have to have back surgery" (pt-stated)        CARE PLAN ENTRY (see longitudinal plan of care for additional care plan information)  Current Barriers:  [name] Knowledge Deficits related to treatment for degenerative spondylosis  . Chronic Disease Management support and education needs related to Essential Hypertension, DMII, Degenerative Spondylosis, fall   Nurse Case Manager Clinical Goal(s):  [name] Over the next 30 days, patient will verbalize understanding of plan for treatment of chronic degenerative spondylosis with associated pain and recent fall   CCM RN CM Interventions:  04/02/20 call completed with patient  . Inter-disciplinary care team collaboration (see longitudinal plan of care) . Evaluation of current treatment plan related to chronic degenerative spondylosis and patient's adherence to plan as established by provider . Determined patient's planned admission to [facility] on 04/03/20 to undergo a Posterior Lumbar Interbody Fusion to L5-S1 is still expected to happen . Determined patient understands her pre-operative instructions; Determined this is a planned overnight admission; Determined Ms. Faso's adult daughter's will be available to assist her as needed upon her discharge home . Discussed plans with patient for ongoing care management follow up and provided patient with direct contact information for care management team  Patient Self Care Activities:  . Self administers medications as prescribed . Attends all scheduled provider appointments . Calls pharmacy for medication refills . Calls provider office for new concerns or questions  Please see past updates related to this goal by clicking on the "Past Updates" button in the selected goal       Patient verbalizes understanding of instructions provided today.   Telephone follow up appointment with care management team member scheduled for:  04/06/20  [name], RN, BSN, CCM Care Management Coordinator [facility]  Direct Phone: [phone]

## 2020-04-04 DIAGNOSIS — M5117 Intervertebral disc disorders with radiculopathy, lumbosacral region: Secondary | ICD-10-CM | POA: Diagnosis not present

## 2020-04-04 LAB — GLUCOSE, CAPILLARY: Glucose-Capillary: 253 mg/dL — ABNORMAL HIGH (ref 70–99)

## 2020-04-04 NOTE — Evaluation (Signed)
Physical Therapy Evaluation Patient Details Name: Kara Kelley MRN: [id number] DOB: 1956/10/02 Today's Date: 04/04/2020   History of Present Illness  Pt is a 64 y/o female s/p PLIF L5-S1. PMH including but not limited to COPD, DM, HTN and substance abuse.  Clinical Impression  Pt presented sitting OOB in recliner chair, awake and willing to participate in therapy session. Prior to admission, pt reported that she ambulated with use of either a cane or RW and required assistance with ADLs. She lives alone but stated that her three daughters will be taking shifts to stay and care for her upon d/c. At the time of evaluation, pt overall moving well without the need for physical assistance. She tolerated hallway ambulation with use of RW and min guard to supervision for safety. She participated in stair training this session as well without difficulties. PT provided pt education re: back precautions with handout provided and a generalized walking program for pt to initiate upon d/c home. Pt expressed understanding. Plan is for pt to d/c home today with family support.     Follow Up Recommendations No PT follow up    Equipment Recommendations  3in1 (PT)    Recommendations for Other Services       Precautions / Restrictions Precautions Precautions: Back Precaution Booklet Issued: Yes (comment) Precaution Comments: reviewed with pt throughout; verbally reviewed log roll technique Required Braces or Orthoses: Spinal Brace Spinal Brace: Lumbar corset;Applied in sitting position Restrictions Weight Bearing Restrictions: No      Mobility  Bed Mobility               General bed mobility comments: pt received OOB in recliner chair  Transfers Overall transfer level: Needs assistance Equipment used: Rolling walker (2 wheeled) Transfers: Sit to/from Stand Sit to Stand: Supervision         General transfer comment: good technique utilized, supervision for  safety  Ambulation/Gait Ambulation/Gait assistance: Min Emergency planning/management officer (Feet): 75 Feet Assistive device: Rolling walker (2 wheeled) Gait Pattern/deviations: Step-through pattern;Decreased step length - right;Decreased step length - left;Decreased stride length Gait velocity: decreased   General Gait Details: pt with slow, cautious and guarded gait with mild instability but no overt LOB or need for physical assistance  Stairs Stairs: Yes Stairs assistance: Min guard Stair Management: Two rails;Step to pattern;Forwards Number of Stairs: 2 General stair comments: no instability or LOB, min guard for safety  Wheelchair Mobility    Modified Rankin (Stroke Patients Only)       Balance Overall balance assessment: Needs assistance Sitting-balance support: Feet supported Sitting balance-Leahy Scale: Good     Standing balance support: Bilateral upper extremity supported;Single extremity supported Standing balance-Leahy Scale: Poor                               Pertinent Vitals/Pain Pain Assessment: Faces Faces Pain Scale: Hurts even more Pain Location: back Pain Descriptors / Indicators: Sore Pain Intervention(s): Monitored during session;Repositioned    Home Living Family/patient expects to be discharged to:: Private residence Living Arrangements: Alone Available Help at Discharge: Family;Available 24 hours/day;Other (Comment) (daughters are planning to take shifts with pt at home) Type of Home: House Home Access: Stairs to enter Entrance Stairs-Rails: Doctor, general practice of Steps: flight Home Layout: One level Home Equipment: Environmental consultant - 2 wheels;Cane - single point      Prior Function Level of Independence: Needs assistance   Gait / Transfers Assistance Needed: ambulates with  a cane or RW  ADL's / Homemaking Assistance Needed: requires assistance with lower body dressing and bathing        Hand Dominance         Extremity/Trunk Assessment   Upper Extremity Assessment Upper Extremity Assessment: Defer to OT evaluation;Overall WFL for tasks assessed    Lower Extremity Assessment Lower Extremity Assessment: Overall WFL for tasks assessed    Cervical / Trunk Assessment Cervical / Trunk Assessment: Other exceptions Cervical / Trunk Exceptions: s/p lumbar sx  Communication   Communication: No difficulties  Cognition Arousal/Alertness: Awake/alert Behavior During Therapy: WFL for tasks assessed/performed Overall Cognitive Status: Within Functional Limits for tasks assessed                                        General Comments      Exercises     Assessment/Plan    PT Assessment Patient needs continued PT services  PT Problem List Decreased strength;Decreased range of motion;Decreased balance;Decreased activity tolerance;Decreased mobility;Decreased coordination;Decreased knowledge of use of DME;Decreased safety awareness;Decreased knowledge of precautions;Pain       PT Treatment Interventions DME instruction;Gait training;Functional mobility training;Therapeutic activities;Stair training;Therapeutic exercise;Balance training;Neuromuscular re-education;Patient/family education    PT Goals (Current goals can be found in the Care Plan section)  Acute Rehab PT Goals Patient Stated Goal: "home today" PT Goal Formulation: With patient Time For Goal Achievement: 04/11/20 Potential to Achieve Goals: Good    Frequency Min 5X/week   Barriers to discharge        Co-evaluation               AM-PAC PT "6 Clicks" Mobility  Outcome Measure Help needed turning from your back to your side while in a flat bed without using bedrails?: None Help needed moving from lying on your back to sitting on the side of a flat bed without using bedrails?: None Help needed moving to and from a bed to a chair (including a wheelchair)?: None Help needed standing up from a chair using  your arms (e.g., wheelchair or bedside chair)?: None Help needed to walk in hospital room?: None Help needed climbing 3-5 steps with a railing? : A Little 6 Click Score: 23    End of Session Equipment Utilized During Treatment: Back brace Activity Tolerance: Patient tolerated treatment well Patient left: in chair;with call bell/phone within reach Nurse Communication: Mobility status PT Visit Diagnosis: Other abnormalities of gait and mobility (R26.89);Pain Pain - part of body:  (back)    Time: 2202-5427 PT Time Calculation (min) (ACUTE ONLY): 16 min   Charges:   PT Evaluation $PT Eval Low Complexity: 1 Low          Eduard Clos, PT, DPT  Acute Rehabilitation Services Pager [phone] Office [place] 04/04/2020, 9:08 AM

## 2020-04-04 NOTE — Evaluation (Signed)
Occupational Therapy Evaluation Patient Details Name: Kara Kelley MRN: [id number] DOB: Dec 16, 1955 Today's Date: 04/04/2020    History of Present Illness Pt is a 64 y/o female s/p PLIF L5-S1. PMH including but not limited to COPD, DM, HTN and substance abuse.   Clinical Impression   Patient evaluated by Occupational Therapy with no further acute OT needs identified. All education has been completed and the patient has no further questions. All education completed.  See below for any follow-up Occupational Therapy or equipment needs. OT is signing off. Thank you for this referral.      Follow Up Recommendations  No OT follow up;Supervision - Intermittent    Equipment Recommendations  3 in 1 bedside commode    Recommendations for Other Services       Precautions / Restrictions Precautions Precautions: Back Precaution Booklet Issued: Yes (comment) Precaution Comments: reviewed with pt throughout; verbally reviewed log roll technique Required Braces or Orthoses: Spinal Brace Spinal Brace: Lumbar corset;Applied in sitting position Restrictions Weight Bearing Restrictions: No      Mobility Bed Mobility               General bed mobility comments: pt received OOB in recliner chair  Transfers Overall transfer level: Needs assistance Equipment used: Rolling walker (2 wheeled) Transfers: Sit to/from [facility] Sit to Stand: Supervision Stand pivot transfers: Supervision       General transfer comment: demonstrates good safety awareness     Balance Overall balance assessment: Needs assistance Sitting-balance support: Feet supported Sitting balance-Leahy Scale: Good     Standing balance support: During functional activity Standing balance-Leahy Scale: Fair                             ADL either performed or assessed with clinical judgement   ADL Overall ADL's : Needs assistance/impaired Eating/Feeding: Independent   Grooming:  Wash/dry hands;Wash/dry face;Oral care;Brushing hair;Supervision/safety;Standing Grooming Details (indicate cue type and reason): Pt instructed in proper technique  Upper Body Bathing: Set up;Supervision/ safety;Sitting   Lower Body Bathing: Supervison/ safety;Sit to/from stand Lower Body Bathing Details (indicate cue type and reason): Pt able to perform figure 4.  Instructed her in safe method for LB ADL, and issued toileting aid which she can use to access peri area for bathing  Upper Body Dressing : Set up;Supervision/safety;Sitting   Lower Body Dressing: Supervision/safety;Sit to/from stand Lower Body Dressing Details (indicate cue type and reason): able to perform figure 4  Toilet Transfer: Supervision/safety;Ambulation;Comfort height toilet   Toileting- Clothing Manipulation and Hygiene: Moderate assistance;Sit to/from stand Toileting - Clothing Manipulation Details (indicate cue type and reason): Pt unable to access posterior peri area without bending.  Issued her a toileting aid and instructed her in use    Tub/Shower Transfer Details (indicate cue type and reason): instructed pt in proper/safe transfer technique  Functional mobility during ADLs: Supervision/safety General ADL Comments: Pt able to state 3/3 back precautions      Vision         Perception     Praxis      Pertinent Vitals/Pain Pain Assessment: 0-10 Pain Score: 8  Faces Pain Scale: Hurts even more Pain Location: back Pain Descriptors / Indicators: Sore;Operative site guarding Pain Intervention(s): Monitored during session     Hand Dominance     Extremity/Trunk Assessment Upper Extremity Assessment Upper Extremity Assessment: Overall WFL for tasks assessed   Lower Extremity Assessment Lower Extremity Assessment: Overall WFL for tasks  assessed   Cervical / Trunk Assessment Cervical / Trunk Assessment: Other exceptions Cervical / Trunk Exceptions: s/p lumbar sx   Communication  Communication Communication: No difficulties   Cognition Arousal/Alertness: Awake/alert Behavior During Therapy: WFL for tasks assessed/performed Overall Cognitive Status: Within Functional Limits for tasks assessed                                     General Comments       Exercises     Shoulder Instructions      Home Living Family/patient expects to be discharged to:: Private residence Living Arrangements: Alone Available Help at Discharge: Family;Available 24 hours/day;Other (Comment) (daughters are planning to take shifts with pt at home) Type of Home: House Home Access: Stairs to enter [employer] of Steps: flight Entrance Stairs-Rails: Right;Left Home Layout: One level     Bathroom Shower/Tub: Tub/shower unit;Curtain         Home Equipment: Environmental consultant - 2 wheels;Cane - single point   Additional Comments: has a tub seat without a back available to use       Prior Functioning/Environment Level of Independence: Needs assistance  Gait / Transfers Assistance Needed: ambulates with a cane or RW ADL's / Homemaking Assistance Needed: requires assistance with lower body dressing and bathing            OT Problem List: Pain;Decreased knowledge of precautions;Decreased knowledge of use of DME or AE      OT Treatment/Interventions:      OT Goals(Current goals can be found in the care plan section) Acute Rehab OT Goals Patient Stated Goal: "home today" OT Goal Formulation: All assessment and education complete, DC therapy  OT Frequency:     Barriers to D/C:            Co-evaluation              AM-PAC OT "6 Clicks" Daily Activity     Outcome Measure Help from another person eating meals?: None Help from another person taking care of personal grooming?: A Little Help from another person toileting, which includes using toliet, bedpan, or urinal?: A Little Help from another person bathing (including washing, rinsing, drying)?: A  Little Help from another person to put on and taking off regular upper body clothing?: A Little Help from another person to put on and taking off regular lower body clothing?: A Little 6 Click Score: 19   End of Session Equipment Utilized During Treatment: Back brace Nurse Communication: Mobility status  Activity Tolerance: Patient tolerated treatment well Patient left: in chair;with call bell/phone within reach  OT Visit Diagnosis: Pain Pain - part of body:  (back )                Time: 0539-7673 OT Time Calculation (min): 27 min Charges:  OT General Charges $OT Visit: 1 Visit OT Evaluation $OT Eval Low Complexity: 1 Low OT Treatments $Self Care/Home Management : 8-22 mins  Eber Jones., OTR/L Acute Rehabilitation Services Pager [phone] Office [phone]   Jeani Hawking M 04/04/2020, 10:45 AM

## 2020-04-04 NOTE — Discharge Summary (Signed)
Physician Discharge Summary  Patient ID: Kara Kelley MRN: [id number] DOB/AGE: 62/31/1957 62 y.o.  Admit date: 04/03/2020 Discharge date: 04/04/2020  Admission Diagnoses:  Lumbar radiculopathy  Discharge Diagnoses:  Same Active Problems:   Lumbar radiculopathy   Discharged Condition: Stable  Hospital Course:  Kara Kelley is a 64 y.o. female who underwent elective L5-S1 PLIF and removal of L4 instrumentation.  She was admitted postoperatively to the spine unit.  There, she was mobilized with the help of physical therapy.  She was voiding well and her pain was well controlled with p.o. pain medications.  Her neurologic exam was the same as preoperatively.  Her dressing was clean dry and intact.  She was deemed ready for discharge home on 6/26.  Discharge instructions were given.  Pain medications were sent to her local pharmacy.  Treatments: Surgery -L5-S1 PLIF  Discharge Exam: Blood pressure (!) 103/57, pulse 86, temperature 97.6 F (36.4 C), temperature source Oral, resp. rate 18, height 4' 11" (1.499 m), weight 95.3 kg, SpO2 96 %. Awake, alert, oriented Speech fluent, appropriate CN grossly intact 5/5 BUE/BLE Wound c/d/i  Disposition: Discharge disposition: 01-Home or Self Care        Allergies as of 04/04/2020   No Known Allergies     Medication List    STOP taking these medications   meloxicam 15 MG tablet Commonly known as: MOBIC     TAKE these medications   Accu-Chek FastClix Lancets Misc USE TO CHECK BLOOD GLUCOSE THREE TIMES DAILY   Accu-Chek Guide test strip Generic drug: glucose blood USE TO TEST FOUR TIMES DAILY   Accu-Chek Nano SmartView w/Device Kit 1 each by Does not apply route 3 (three) times daily.   albuterol 108 (90 Base) MCG/ACT inhaler Commonly known as: VENTOLIN HFA Inhale 2 puffs into the lungs every 6 (six) hours as needed. For shortness of breath. What changed:   reasons to take this  additional instructions    ARIPiprazole 10 MG tablet Commonly known as: ABILIFY Take 10 mg by mouth daily.   atorvastatin 10 MG tablet Commonly known as: LIPITOR Take 1 tablet (10 mg total) by mouth daily.   buPROPion 300 MG 24 hr tablet Commonly known as: WELLBUTRIN XL Take 300 mg by mouth daily.   cyclobenzaprine 5 MG tablet Commonly known as: FLEXERIL Take 1 tablet (5 mg total) by mouth 3 (three) times daily as needed for muscle spasms.   EVENING PRIMROSE OIL PO Take 1,000 mg by mouth daily.   gabapentin 300 MG capsule Commonly known as: NEURONTIN Take 300 mg by mouth 3 (three) times daily as needed (pain).   Levemir FlexTouch 100 UNIT/ML FlexPen Generic drug: insulin detemir INJECT 40 UNITS INTO SKIN AT BEDTIME What changed: See the new instructions.   lidocaine 5 % Commonly known as: Lidoderm Place 1 patch onto the skin daily. Remove & Discard patch within 12 hours or as directed by MD What changed:   when to take this  reasons to take this   Linzess 145 MCG Caps capsule Generic drug: linaclotide Take 145 mcg by mouth daily before breakfast.   liraglutide 18 MG/3ML Sopn Commonly known as: VICTOZA Inject 0.3 mLs (1.8 mg total) into the skin daily.   lisinopril 10 MG tablet Commonly known as: ZESTRIL TAKE 1 TABLET(10 MG) BY MOUTH DAILY What changed: See the new instructions.   Magnesium 200 MG Tabs Take one tablet by mouth with evening meal.   metFORMIN 500 MG tablet Commonly known as: GLUCOPHAGE TAKE   1 TABLET(500 MG) BY MOUTH TWICE DAILY WITH A MEAL   NovoLOG FlexPen 100 UNIT/ML FlexPen Generic drug: insulin aspart ADMINISTER 18 UNITS UNDER THE SKIN THREE TIMES DAILY AS NEEDED FOR HIGH BLOOD SUGAR What changed: See the new instructions.   Omega 3 1200 MG Caps Take 1,200 mg by mouth daily.   omeprazole 40 MG capsule Commonly known as: PRILOSEC TAKE 1 CAPSULE(40 MG) BY MOUTH DAILY BEFORE BREAKFAST What changed: See the new instructions.   Pen Needles 32G X 4 MM Misc 1  each by Does not apply route 3 (three) times daily.   promethazine 25 MG tablet Commonly known as: PHENERGAN Take 1 tablet (25 mg total) by mouth every 8 (eight) hours as needed for nausea or vomiting.   Semaglutide(0.25 or 0.5MG/DOS) 2 MG/1.5ML Sopn Commonly known as: Ozempic (0.25 or 0.5 MG/DOSE) Inject 0.5 mg into the skin once a week.   spironolactone 50 MG tablet Commonly known as: ALDACTONE TAKE 1 TABLET(50 MG) BY MOUTH DAILY What changed: See the new instructions.   traZODone 150 MG tablet Commonly known as: DESYREL Take 150 mg by mouth at bedtime.   Vitamin D 50 MCG (2000 UT) Caps Take 2,000 Units by mouth daily.   vitamin E 180 MG (400 UNITS) capsule Take 400 Units by mouth daily.   Voltaren 1 % Gel Generic drug: diclofenac Sodium Apply 2 g topically 2 (two) times daily as needed (pain).            Durable Medical Equipment  (From admission, onward)         Start     Ordered   04/03/20 1557  DME Walker rolling  Once       Question:  Patient needs a walker to treat with the following condition  Answer:  Lumbar radiculopathy   04/03/20 1556   04/03/20 1557  DME 3 n 1  Once        04/03/20 1556          Follow-up Information    Earnie Larsson, MD Follow up.   Specialty: Neurosurgery Contact information: [address] [place] [address] [phone]               Signed: Vallarie Mare 04/04/2020, 9:16 AM

## 2020-04-04 NOTE — Plan of Care (Signed)
Patient alert and oriented, mae's well, voiding adequate amount of urine, swallowing without difficulty, no c/o pain at time of discharge. Patient discharged home with family. Script and discharged instructions given to patient. Patient and family stated understanding of instructions given. Patient has an appointment with Dr. Pool  

## 2020-04-04 NOTE — Discharge Instructions (Signed)
Wound Care Take a shower when you ready Keep incision covered and dry for two days.    Do not put any creams, lotions, or ointments on incision. Leave steri-strips on back.  They will fall off by themselves. Activity Walk each and every day, increasing distance each day. No lifting greater than 5 lbs.  Avoid excessive neck motion. No driving for 2 weeks; may ride as a passenger locally. If provided with back brace, wear when out of bed.  It is not necessary to wear brace in bed. Diet Resume your normal diet.  Return to Work Will be discussed at you follow up appointment. Call Your Doctor If Any of These Occur Redness, drainage, or swelling at the wound.  Temperature greater than 101 degrees. Severe pain not relieved by pain medication. Incision starts to come apart. Follow Up Appt Call today for appointment in 1-2 weeks ([phone]) or for problems.  If you have any hardware placed in your spine, you will need an x-ray before your appointment.

## 2020-04-06 ENCOUNTER — Telehealth: Payer: Self-pay

## 2020-04-06 ENCOUNTER — Ambulatory Visit: Payer: Self-pay

## 2020-04-06 ENCOUNTER — Other Ambulatory Visit: Payer: Self-pay | Admitting: Nurse Practitioner

## 2020-04-06 ENCOUNTER — Other Ambulatory Visit: Payer: Self-pay

## 2020-04-06 DIAGNOSIS — M25541 Pain in joints of right hand: Secondary | ICD-10-CM

## 2020-04-06 DIAGNOSIS — E1122 Type 2 diabetes mellitus with diabetic chronic kidney disease: Secondary | ICD-10-CM

## 2020-04-06 DIAGNOSIS — M4310 Spondylolisthesis, site unspecified: Secondary | ICD-10-CM

## 2020-04-06 DIAGNOSIS — I10 Essential (primary) hypertension: Secondary | ICD-10-CM

## 2020-04-07 ENCOUNTER — Ambulatory Visit: Payer: Medicaid Other | Admitting: Podiatry

## 2020-04-08 MED FILL — Sodium Chloride IV Soln 0.9%: INTRAVENOUS | Qty: 1000 | Status: AC

## 2020-04-08 MED FILL — Heparin Sodium (Porcine) Inj 1000 Unit/ML: INTRAMUSCULAR | Qty: 30 | Status: AC

## 2020-04-09 NOTE — Patient Instructions (Addendum)
Visit Information  Goals Addressed      Patient Stated   .  "I have to have back surgery" (pt-stated)        CARE PLAN ENTRY (see longitudinal plan of care for additional care plan information)  Current Barriers:  [name] Knowledge Deficits related to treatment for degenerative spondylosis  . Chronic Disease Management support and education needs related to Essential Hypertension, DMII, Degenerative Spondylosis, fall   Nurse Case Manager Clinical Goal(s):  [name] Over the next 30 days, patient will verbalize understanding of plan for treatment of chronic degenerative spondylosis with associated pain and recent fall   CCM RN CM Interventions:  04/02/20 call completed with patient  . Inter-disciplinary care team collaboration (see longitudinal plan of care) . Evaluation of current treatment plan related to chronic degenerative spondylosis and patient's adherence to plan as established by provider . Determined patient's planned admission to [facility] on 04/03/20 to undergo a Posterior Lumbar Interbody Fusion to L5-S1 is still expected to happen . Determined patient understands her pre-operative instructions; Determined this is a planned overnight admission; Determined Kara Kelley's adult daughter's will be available to assist her as needed upon her discharge home . Discussed plans with patient for ongoing care management follow up and provided patient with direct contact information for care management team 04/07/20 call completed with patient  . Evaluation of current treatment plan related to chronic degenerative spondylosis and patient's adherence to plan as established by provider . Determined patient underwent a Posterior Lumbar Interbody Fusion to L5-S1 on 04/03/20 with the following d/c instructions discussed and reviewed:  o Discharge Diagnoses:  o Same o Active Problems: o Lumbar radiculopathy o Discharged Condition: Stable o Hospital Course:  o Kara Kelley is a 64 y.o.  female who underwent elective L5-S1 PLIF and removal of L4 instrumentation.  She was admitted postoperatively to the spine unit.  There, she was mobilized with the help of physical therapy.  She was voiding well and her pain was well controlled with p.o. pain medications.  Her neurologic exam was the same as preoperatively.  Her dressing was clean dry and intact.  She was deemed ready for discharge home on 6/26.  Discharge instructions were given.  Pain medications were sent to her local pharmacy. Treatments: Surgery -L5-S1 PLIF o Discharge Exam: o Blood pressure (!) 103/57, pulse 86, temperature 97.6 F (36.4 C), temperature source Oral, resp. rate 18, height 4\' 11"  (1.499 m), weight 95.3 kg, SpO2 96 %. o Awake, alert, oriented o Speech fluent, appropriate o CN grossly intact o 5/5 BUE/BLE o Wound c/d/i o Disposition: Discharge disposition: 01-Home or Self Care o Medication List o  STOP taking these medications  o  meloxicam 15 MG tablet o Commonly known as: MOBIC o   o   o  DME  o 3 n 1   o   o    o    o   o Follow-up Information  o   o  o  o    o Pool, Agricultural consultant, MD Follow up.  o  Specialty: Neurosurgery o Contact information: o [address] o [address] [place] o [phone]  o  o   o  Determined patient has a post op f/u scheduled with Neurosurgery on 04/16/20 with Dr. 06/17/20 @1 :30 PM, her daughter will drive her to the appointment o Reviewed s/s suggestive of infection and or potential complication and when to call the doctor if needed  o Discussed plans with patient for ongoing care management follow up and provided patient with direct contact information for care management team  Patient Self Care Activities:  . Self administers medications as prescribed . Attends all scheduled provider appointments . Calls pharmacy for medication refills . Calls provider office for new concerns or questions  Please see past updates related to this goal  by clicking on the "Past Updates" button in the selected goal        Patient verbalizes understanding of instructions provided today.   Telephone follow up appointment with care management team member scheduled for: 05/21/20  [name], RN, BSN, CCM Care Management Coordinator [facility] Care Management/[facility]  Direct Phone: [phone]

## 2020-04-09 NOTE — Chronic Care Management (AMB) (Signed)
Care Management   Follow Up Note   04/08/2020 Name: Kara Kelley MRN: [id number] DOB: [birth year]  Referred by: [name], FNP Reason for referral : Chronic Care Management (FU RN Call - post surgery/discharge)   Kara Kelley is a 64 y.o. year old female who is a primary care patient of [name], [place]. The CCM team was consulted for assistance with chronic disease management and care coordination needs.    Review of patient status, including review of consultants reports, relevant laboratory and other test results, and collaboration with appropriate care team members and the patient's provider was performed as part of comprehensive patient evaluation and provision of chronic care management services.    SDOH (Social Determinants of Health) assessments performed: Yes - no acute challenges  See Care Plan activities for detailed interventions related to Home)   Placed outbound call to patient for CCM RN CM post surgical discharge follow up.     Outpatient Encounter Medications as of 04/06/2020  Medication Sig   Accu-Chek FastClix Lancets MISC USE TO CHECK BLOOD GLUCOSE THREE TIMES DAILY   ACCU-CHEK GUIDE test strip USE TO TEST FOUR TIMES DAILY   albuterol (PROVENTIL HFA;VENTOLIN HFA) 108 (90 Base) MCG/ACT inhaler Inhale 2 puffs into the lungs every 6 (six) hours as needed. For shortness of breath. (Patient taking differently: Inhale 2 puffs into the lungs every 6 (six) hours as needed for wheezing or shortness of breath. )   ARIPiprazole (ABILIFY) 10 MG tablet Take 10 mg by mouth daily.    atorvastatin (LIPITOR) 10 MG tablet Take 1 tablet (10 mg total) by mouth daily.   Blood Glucose Monitoring Suppl (ACCU-CHEK NANO SMARTVIEW) w/Device KIT 1 each by Does not apply route 3 (three) times daily.   buPROPion (WELLBUTRIN XL) 300 MG 24 hr tablet Take 300 mg by mouth daily.    Cholecalciferol (VITAMIN D) 50 MCG (2000 UT) CAPS Take 2,000 Units by mouth daily.    cyclobenzaprine (FLEXERIL) 5 MG tablet Take 1 tablet (5 mg total) by mouth 3 (three) times daily as needed for muscle spasms. (Patient not taking: Reported on 03/25/2020)   diclofenac Sodium (VOLTAREN) 1 % GEL Apply 2 g topically 2 (two) times daily as needed (pain).   EVENING PRIMROSE OIL PO Take 1,000 mg by mouth daily.   gabapentin (NEURONTIN) 300 MG capsule Take 300 mg by mouth 3 (three) times daily as needed (pain).   Insulin Pen Needle (PEN NEEDLES) 32G X 4 MM MISC 1 each by Does not apply route 3 (three) times daily.   LEVEMIR FLEXTOUCH 100 UNIT/ML Pen INJECT 40 UNITS INTO SKIN AT BEDTIME (Patient taking differently: Inject 30 Units into the skin at bedtime. )   lidocaine (LIDODERM) 5 % Place 1 patch onto the skin daily. Remove & Discard patch within 12 hours or as directed by MD (Patient taking differently: Place 1 patch onto the skin daily as needed (pain). Remove & Discard patch within 12 hours or as directed by MD)   LINZESS 145 MCG CAPS capsule Take 145 mcg by mouth daily before breakfast.    liraglutide (VICTOZA) 18 MG/3ML SOPN Inject 0.3 mLs (1.8 mg total) into the skin daily. (Patient not taking: Reported on 03/25/2020)   lisinopril (ZESTRIL) 10 MG tablet TAKE 1 TABLET(10 MG) BY MOUTH DAILY (Patient taking differently: Take 10 mg by mouth daily. )   Magnesium 200 MG TABS Take one tablet by mouth with evening meal. (Patient not taking: Reported on 12/19/2019)   metFORMIN (  GLUCOPHAGE) 500 MG tablet TAKE 1 TABLET(500 MG) BY MOUTH TWICE DAILY WITH A MEAL (Patient not taking: Reported on 03/19/2020)   NOVOLOG FLEXPEN 100 UNIT/ML FlexPen ADMINISTER 18 UNITS UNDER THE SKIN THREE TIMES DAILY AS NEEDED FOR HIGH BLOOD SUGAR (Patient taking differently: Inject 18 Units into the skin 3 (three) times daily with meals. )   Omega 3 1200 MG CAPS Take 1,200 mg by mouth daily.   omeprazole (PRILOSEC) 40 MG capsule TAKE 1 CAPSULE(40 MG) BY MOUTH DAILY BEFORE BREAKFAST (Patient taking differently:  Take 40 mg by mouth daily before breakfast. )   promethazine (PHENERGAN) 25 MG tablet Take 1 tablet (25 mg total) by mouth every 8 (eight) hours as needed for nausea or vomiting. (Patient not taking: Reported on 12/19/2019)   Semaglutide,0.25 or 0.5MG/DOS, (OZEMPIC, 0.25 OR 0.5 MG/DOSE,) 2 MG/1.5ML SOPN Inject 0.5 mg into the skin once a week.   spironolactone (ALDACTONE) 50 MG tablet TAKE 1 TABLET(50 MG) BY MOUTH DAILY (Patient taking differently: Take 50 mg by mouth daily. )   traZODone (DESYREL) 150 MG tablet Take 150 mg by mouth at bedtime.    vitamin E 180 MG (400 UNITS) capsule Take 400 Units by mouth daily.   No facility-administered encounter medications on file as of 04/06/2020.     Objective:   Lab Results  Component Value Date   HGBA1C 7.2 (H) 03/12/2020   HGBA1C 7.1 (H) 12/19/2019   HGBA1C 7.1 (H) 09/23/2019   Lab Results  Component Value Date   MICROALBUR 80 06/19/2019   LDLCALC 46 12/19/2019   CREATININE 1.24 (H) 03/31/2020   BP Readings from Last 3 Encounters:  04/04/20 (!) 103/57  03/31/20 (!) 150/82  03/12/20 130/80    Goals Addressed      Patient Stated     "I have to have back surgery" (pt-stated)        [place] (see longitudinal plan of care for additional care plan information)  Current Barriers:   Knowledge Deficits related to treatment for degenerative spondylosis   Chronic Disease Management support and education needs related to Essential Hypertension, DMII, Degenerative Spondylosis, fall   Nurse Case Manager Clinical Goal(s):   Over the next 30 days, patient will verbalize understanding of plan for treatment of chronic degenerative spondylosis with associated pain and recent fall   CCM RN CM Interventions:  04/02/20 call completed with patient   Inter-disciplinary care team collaboration (see longitudinal plan of care)  Evaluation of current treatment plan related to chronic degenerative spondylosis and patient's adherence to  plan as established by provider  Determined patient's planned admission to [facility] on 04/03/20 to undergo a Posterior Lumbar Interbody Fusion to L5-S1 is still expected to happen  Determined patient understands her pre-operative instructions; Determined this is a planned overnight admission; Determined Ms. Hinz's adult daughter's will be available to assist her as needed upon her discharge home  Discussed plans with patient for ongoing care management follow up and provided patient with direct contact information for care management team 04/07/20 call completed with patient   Evaluation of current treatment plan related to chronic degenerative spondylosis and patient's adherence to plan as established by provider  Determined patient underwent a Posterior Lumbar Interbody Fusion to L5-S1 on 04/03/20 with the following d/c instructions discussed and reviewed:  o Discharge Diagnoses:  o Same o Active Problems: o Lumbar radiculopathy o Discharged Condition: Stable o Hospital Course:  o KARLINA SUARES is a 64 y.o. female who underwent  elective L5-S1 PLIF and removal of L4 instrumentation.  She was admitted postoperatively to the spine unit.  There, she was mobilized with the help of physical therapy.  She was voiding well and her pain was well controlled with p.o. pain medications.  Her neurologic exam was the same as preoperatively.  Her dressing was clean dry and intact.  She was deemed ready for discharge home on 6/26.  Discharge instructions were given.  Pain medications were sent to her local pharmacy. Treatments: Surgery -L5-S1 PLIF o Discharge Exam: o Blood pressure (!) 103/57, pulse 86, temperature 97.6 F (36.4 C), temperature source Oral, resp. rate 18, height 4' 11" (1.499 m), weight 95.3 kg, SpO2 96 %. o Awake, alert, oriented o Speech fluent, appropriate o CN grossly intact o 5/5 BUE/BLE o Wound c/d/i o Disposition: Discharge disposition: 01-Home or Self  Care o Medication List o  STOP taking these medications  o  meloxicam 15 MG tablet o Commonly known as: MOBIC o   o   o  DME  o 3 n 1   o   o   Rolling Brewton, MD Follow up.  o  Specialty: Neurosurgery o Contact information: o [address] [place] [address] o [phone]  o  o   o  Determined patient has a post op f/u scheduled with Neurosurgery on 04/16/20 with Dr. Annette Stable _0 :30 PM, her daughter will drive her to the appointment o Reviewed s/s suggestive of infection and or potential complication and when to call the doctor if needed o Discussed plans with patient for ongoing care management follow up and provided patient with direct contact information for care management team  Patient Self Care Activities:   Self administers medications as prescribed  Attends all scheduled provider appointments  Calls pharmacy for medication refills  Calls provider office for new concerns or questions  Please see past updates related to this goal by clicking on the "Past Updates" button in the selected goal        Plan:   Telephone follow up appointment with care management team member scheduled for: 05/21/20  Barb Merino, RN, BSN, CCM Care Management Coordinator [place] Management/[facility]  Direct Phone: [phone]

## 2020-04-10 ENCOUNTER — Telehealth: Payer: Self-pay

## 2020-04-10 ENCOUNTER — Ambulatory Visit: Payer: Self-pay

## 2020-04-10 DIAGNOSIS — I10 Essential (primary) hypertension: Secondary | ICD-10-CM

## 2020-04-10 DIAGNOSIS — E1122 Type 2 diabetes mellitus with diabetic chronic kidney disease: Secondary | ICD-10-CM

## 2020-04-10 DIAGNOSIS — N1832 Chronic kidney disease, stage 3b: Secondary | ICD-10-CM

## 2020-04-10 NOTE — Chronic Care Management (AMB) (Signed)
  Chronic Care Management   Outreach Note  04/10/2020 Name: Kara Kelley MRN: [id number] DOB: 1956-10-08  Referred by: Arnette Felts, FNP Reason for referral : Care Coordination   SW placed an unsuccessful outbound call to the patient to assess progression of patient stated goal. SW left a HIPAA compliant voice message requesting a return call.  Follow Up Plan: The care management team will reach out to the patient again over the next 14 days.   Bevelyn Ngo, BSW, CDP Social Worker, Certified Dementia Practitioner [facility] / [facility] Care Management [phone]

## 2020-04-16 ENCOUNTER — Telehealth: Payer: Self-pay | Admitting: Nurse Practitioner

## 2020-04-16 NOTE — Chronic Care Management (AMB) (Signed)
  Care Management   Note  04/16/2020 Name: Kara Kelley MRN: [id number] DOB: [birth year]  Kara Kelley is a 64 y.o. year old female who is a primary care patient of Arnette Felts, FNP and is actively engaged with the care management team. I reached out to Kara Kelley by phone today to assist with re-scheduling an initial visit with the Pharmacist.  Follow up plan: Unsuccessful telephone outreach attempt made. A HIPPA compliant phone message was left for the patient providing contact information and requesting a return call. The care management team will reach out to the patient again over the next 7 days. If patient returns call to provider office, please advise to call Embedded Care Management Care Guide Gwenevere Ghazi at [phone].  Gwenevere Ghazi  Care Guide, Embedded Care Coordination [facility]  [place], [place] [address] Direct Dial: [phone] [email or web address]: [email or web address]

## 2020-04-17 ENCOUNTER — Telehealth: Payer: Medicaid Other

## 2020-04-17 NOTE — Chronic Care Management (AMB) (Signed)
  Care Management   Note  04/17/2020 Name: Kara Kelley MRN: [id number] DOB: Jan 18, 1956  Kara Kelley is a 64 y.o. year old female who is a primary care patient of Arnette Felts, FNP and is actively engaged with the care management team. I reached out to Kara Kelley by phone today to assist with re-scheduling an initial visit with the Pharmacist.  Follow up plan: Telephone appointment with care management team member scheduled for: 05/15/2020  [facility] Guide, Embedded Care Coordination [facility]  [place], [place] [address] Direct Dial: [phone] [email or web address]: [email or web address]

## 2020-04-21 ENCOUNTER — Ambulatory Visit: Payer: Medicaid Other

## 2020-04-21 DIAGNOSIS — E1122 Type 2 diabetes mellitus with diabetic chronic kidney disease: Secondary | ICD-10-CM

## 2020-04-21 DIAGNOSIS — I10 Essential (primary) hypertension: Secondary | ICD-10-CM

## 2020-04-21 NOTE — Patient Instructions (Signed)
Visit Information  Goals Addressed            This Visit's Progress   . Assist patient to become more knowledgable of Advance Directives   On track    CARE PLAN ENTRY (see longtitudinal plan of care for additional care plan information)  Current Barriers:  . Limited education about the importance of naming a healthcare power of attorney . Upcoming back surgery . 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 New 04/21/20 Over the next 60 days the patient will attend office visit to meet with SW to review Advance Directive packet and gain better understanding of importance of completion  CCM SW Interventions: Completed 04/21/20 . Successful outbound call placed to the patient to confirm receipt of mailed resource . Determined the patient has been staying with her daughter since recent surgery and is unsure if she has received or not . Discussed opportunity to meet with SW in clinic to review forms and allow SW to assist with completion . Determined the patient is interested in office visit and would like SW to contact over the next 3 weeks to schedule an in person appointment . SW will follow up with the patient over the next 3 weeks to schedule office visit  Patient Self Care Activities:  . Is able to readily make contact with social support system . Can identify next of kin, power or attorney, guardian, or primary caregiver . Attends all scheduled provider appointments . Calls provider office for new concerns or questions           The care management team will reach out to the patient again over the next 21 days.   Bevelyn Ngo, BSW, CDP Social Worker, Certified Dementia Practitioner [facility] Care Management [phone]

## 2020-04-21 NOTE — Chronic Care Management (AMB) (Signed)
Care Management    Social Work Follow Up Note  04/21/2020 Name: Kara Kelley MRN: [id number] DOB: [birth year]  Kara Kelley is a 64 y.o. year old female who is a primary care patient of [name], [place]. The CCM team was consulted for assistance with care coordination.   Review of patient status, including review of consultants reports, other relevant assessments, and collaboration with appropriate care team members and the patient's provider was performed as part of comprehensive patient evaluation and provision of chronic care management services.    SDOH (Social Determinants of Health) assessments performed: No    Outpatient Encounter Medications as of 04/21/2020  Medication Sig  . Accu-Chek FastClix Lancets MISC USE TO CHECK BLOOD GLUCOSE THREE TIMES DAILY  . ACCU-CHEK GUIDE test strip USE TO TEST FOUR TIMES DAILY  . albuterol (PROVENTIL HFA;VENTOLIN HFA) 108 (90 Base) MCG/ACT inhaler Inhale 2 puffs into the lungs every 6 (six) hours as needed. For shortness of breath. (Patient taking differently: Inhale 2 puffs into the lungs every 6 (six) hours as needed for wheezing or shortness of breath. )  . ARIPiprazole (ABILIFY) 10 MG tablet Take 10 mg by mouth daily.   [name] atorvastatin (LIPITOR) 10 MG tablet Take 1 tablet (10 mg total) by mouth daily.  . Blood Glucose Monitoring Suppl (ACCU-CHEK NANO SMARTVIEW) w/Device KIT 1 each by Does not apply route 3 (three) times daily.  [name] buPROPion (WELLBUTRIN XL) 300 MG 24 hr tablet Take 300 mg by mouth daily.   . Cholecalciferol (VITAMIN D) 50 MCG (2000 UT) CAPS Take 2,000 Units by mouth daily.  . cyclobenzaprine (FLEXERIL) 5 MG tablet Take 1 tablet (5 mg total) by mouth 3 (three) times daily as needed for muscle spasms. (Patient not taking: Reported on 03/25/2020)  . diclofenac Sodium (VOLTAREN) 1 % GEL Apply 2 g topically 2 (two) times daily as needed (pain).  [name] EVENING PRIMROSE OIL PO Take 1,000 mg by mouth daily.  [name] gabapentin (NEURONTIN) 300  MG capsule Take 300 mg by mouth 3 (three) times daily as needed (pain).  . Insulin Pen Needle (PEN NEEDLES) 32G X 4 MM MISC 1 each by Does not apply route 3 (three) times daily.  [name] LEVEMIR FLEXTOUCH 100 UNIT/ML Pen INJECT 40 UNITS INTO SKIN AT BEDTIME (Patient taking differently: Inject 30 Units into the skin at bedtime. )  . lidocaine (LIDODERM) 5 % Place 1 patch onto the skin daily. Remove & Discard patch within 12 hours or as directed by MD (Patient taking differently: Place 1 patch onto the skin daily as needed (pain). Remove & Discard patch within 12 hours or as directed by MD)  . LINZESS 145 MCG CAPS capsule Take 145 mcg by mouth daily before breakfast.   . liraglutide (VICTOZA) 18 MG/3ML SOPN Inject 0.3 mLs (1.8 mg total) into the skin daily. (Patient not taking: Reported on 03/25/2020)  . lisinopril (ZESTRIL) 10 MG tablet TAKE 1 TABLET(10 MG) BY MOUTH DAILY (Patient taking differently: Take 10 mg by mouth daily. )  . Magnesium 200 MG TABS Take one tablet by mouth with evening meal. (Patient not taking: Reported on 12/19/2019)  . metFORMIN (GLUCOPHAGE) 500 MG tablet TAKE 1 TABLET(500 MG) BY MOUTH TWICE DAILY WITH A MEAL (Patient not taking: Reported on 03/19/2020)  . NOVOLOG FLEXPEN 100 UNIT/ML FlexPen ADMINISTER 18 UNITS UNDER THE SKIN THREE TIMES DAILY AS NEEDED FOR HIGH BLOOD SUGAR (Patient taking differently: Inject 18 Units into the skin 3 (three) times daily with meals. )  .  Omega 3 1200 MG CAPS Take 1,200 mg by mouth daily.  [name] omeprazole (PRILOSEC) 40 MG capsule TAKE 1 CAPSULE(40 MG) BY MOUTH DAILY BEFORE BREAKFAST (Patient taking differently: Take 40 mg by mouth daily before breakfast. )  . promethazine (PHENERGAN) 25 MG tablet Take 1 tablet (25 mg total) by mouth every 8 (eight) hours as needed for nausea or vomiting. (Patient not taking: Reported on 12/19/2019)  . Semaglutide,0.25 or 0.5MG/DOS, (OZEMPIC, 0.25 OR 0.5 MG/DOSE,) 2 MG/1.5ML SOPN Inject 0.5 mg into the skin once a week.  .  spironolactone (ALDACTONE) 50 MG tablet TAKE 1 TABLET(50 MG) BY MOUTH DAILY (Patient taking differently: Take 50 mg by mouth daily. )  . traZODone (DESYREL) 150 MG tablet Take 150 mg by mouth at bedtime.   . vitamin E 180 MG (400 UNITS) capsule Take 400 Units by mouth daily.   No facility-administered encounter medications on file as of 04/21/2020.     Goals Addressed            This Visit's Progress   . Assist patient to become more knowledgable of Advance Directives   On track    [place] (see longtitudinal plan of care for additional care plan information)  Current Barriers:  . Limited education about the importance of naming a healthcare power of attorney . Upcoming back surgery . 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 New 04/21/20 Over the next 60 days the patient will attend office visit to meet with SW to review Advance Directive packet and gain better understanding of importance of completion  CCM SW Interventions: Completed 04/21/20 . Successful outbound call placed to the patient to confirm receipt of mailed resource . Determined the patient has been staying with her daughter since recent surgery and is unsure if she has received or not . Discussed opportunity to meet with SW in clinic to review forms and allow SW to assist with completion . Determined the patient is interested in office visit and would like SW to contact over the next 3 weeks to schedule an in person appointment . SW will follow up with the patient over the next 3 weeks to schedule office visit  Patient Self Care Activities:  . Is able to readily make contact with social support system . Can identify next of kin, power or attorney, guardian, or primary  caregiver . Attends all scheduled provider appointments . Calls provider office for new concerns or questions            Follow Up Plan: SW will follow up with patient by phone over the next 3 weeks.   Daneen Schick, BSW, CDP Social Worker, Certified Dementia Practitioner [place] / [place] Management [phone]

## 2020-04-27 ENCOUNTER — Other Ambulatory Visit: Payer: Self-pay | Admitting: Nurse Practitioner

## 2020-04-27 DIAGNOSIS — N183 Chronic kidney disease, stage 3 unspecified: Secondary | ICD-10-CM

## 2020-04-27 DIAGNOSIS — E1122 Type 2 diabetes mellitus with diabetic chronic kidney disease: Secondary | ICD-10-CM

## 2020-05-05 ENCOUNTER — Other Ambulatory Visit: Payer: Self-pay | Admitting: Nurse Practitioner

## 2020-05-08 ENCOUNTER — Ambulatory Visit: Payer: Medicaid Other

## 2020-05-08 DIAGNOSIS — E1122 Type 2 diabetes mellitus with diabetic chronic kidney disease: Secondary | ICD-10-CM

## 2020-05-08 DIAGNOSIS — I10 Essential (primary) hypertension: Secondary | ICD-10-CM

## 2020-05-08 NOTE — Patient Instructions (Signed)
Social Worker Visit Information  Goals we discussed today:  Goals Addressed            This Visit's Progress   . Assist patient to become more knowledgable of Advance Directives   On track    CARE PLAN ENTRY (see longtitudinal plan of care for additional care plan information)  Current Barriers:  . Limited education about the importance of naming a healthcare power of attorney . Upcoming back surgery . 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 New 04/21/20 Over the next 60 days the patient will attend office visit to meet with SW to review Advance Directive packet and gain better understanding of importance of completion  CCM SW Interventions: Completed 05/08/20 . Successful outbound call placed to the patient . Scheduled an office visit for August 11 at 10:00 am to assist patient with completion of Advance Directive packet  Patient Self Care Activities:  . Is able to readily make contact with social support system . Can identify next of kin, power or attorney, guardian, or primary caregiver . Attends all scheduled provider appointments . Calls provider office for new concerns or questions            Follow Up Plan: Appointment scheduled for SW to meet with client in provider office on: August 11   Bevelyn Ngo, [place], CDP Social Worker, Certified Dementia Practitioner [facility] Care Management [phone]

## 2020-05-08 NOTE — Chronic Care Management (AMB) (Signed)
Chronic Care Management    Social Work Follow Up Note  05/08/2020 Name: Kara Kelley MRN: [id number] DOB: 1956-04-19  Kara Kelley is a 64 y.o. year old female who is a primary care patient of [name], [place]. The CCM team was consulted for assistance with care coordination.   Review of patient status, including review of consultants reports, other relevant assessments, and collaboration with appropriate care team members and the patient's provider was performed as part of comprehensive patient evaluation and provision of chronic care management services.    SDOH (Social Determinants of Health) assessments performed: No    Outpatient Encounter Medications as of 05/08/2020  Medication Sig  . Accu-Chek FastClix Lancets MISC USE TO CHECK BLOOD GLUCOSE THREE TIMES DAILY  . ACCU-CHEK GUIDE test strip USE TO TEST FOUR TIMES DAILY  . albuterol (PROVENTIL HFA;VENTOLIN HFA) 108 (90 Base) MCG/ACT inhaler Inhale 2 puffs into the lungs every 6 (six) hours as needed. For shortness of breath. (Patient taking differently: Inhale 2 puffs into the lungs every 6 (six) hours as needed for wheezing or shortness of breath. )  . ARIPiprazole (ABILIFY) 10 MG tablet Take 10 mg by mouth daily.   [name] atorvastatin (LIPITOR) 10 MG tablet Take 1 tablet (10 mg total) by mouth daily.  . BD PEN NEEDLE NANO 2ND GEN 32G X 4 MM MISC USE THREE TIMES DAILY AS DIRECTED  . Blood Glucose Monitoring Suppl (ACCU-CHEK NANO SMARTVIEW) w/Device KIT 1 each by Does not apply route 3 (three) times daily.  [name] buPROPion (WELLBUTRIN XL) 300 MG 24 hr tablet Take 300 mg by mouth daily.   . Cholecalciferol (VITAMIN D) 50 MCG (2000 UT) CAPS Take 2,000 Units by mouth daily.  . cyclobenzaprine (FLEXERIL) 5 MG tablet Take 1 tablet (5 mg total) by mouth 3 (three) times daily as needed for muscle spasms. (Patient not taking: Reported on 03/25/2020)  . diclofenac Sodium (VOLTAREN) 1 % GEL Apply 2 g topically 2 (two) times daily as needed (pain).   [name] EVENING PRIMROSE OIL PO Take 1,000 mg by mouth daily.  [name] gabapentin (NEURONTIN) 300 MG capsule Take 300 mg by mouth 3 (three) times daily as needed (pain).  . insulin detemir (LEVEMIR FLEXTOUCH) 100 UNIT/ML FlexPen Inject 30 Units into the skin at bedtime.  . lidocaine (LIDODERM) 5 % Place 1 patch onto the skin daily. Remove & Discard patch within 12 hours or as directed by MD (Patient taking differently: Place 1 patch onto the skin daily as needed (pain). Remove & Discard patch within 12 hours or as directed by MD)  . LINZESS 145 MCG CAPS capsule Take 145 mcg by mouth daily before breakfast.   . liraglutide (VICTOZA) 18 MG/3ML SOPN Inject 0.3 mLs (1.8 mg total) into the skin daily. (Patient not taking: Reported on 03/25/2020)  . lisinopril (ZESTRIL) 10 MG tablet TAKE 1 TABLET(10 MG) BY MOUTH DAILY (Patient taking differently: Take 10 mg by mouth daily. )  . Magnesium 200 MG TABS Take one tablet by mouth with evening meal. (Patient not taking: Reported on 12/19/2019)  . metFORMIN (GLUCOPHAGE) 500 MG tablet TAKE 1 TABLET(500 MG) BY MOUTH TWICE DAILY WITH A MEAL (Patient not taking: Reported on 03/19/2020)  . NOVOLOG FLEXPEN 100 UNIT/ML FlexPen Inject 18 Units into the skin 3 (three) times daily with meals.  . Omega 3 1200 MG CAPS Take 1,200 mg by mouth daily.  [name] omeprazole (PRILOSEC) 20 MG capsule TAKE 1 CAPSULE BY MOUTH EVERY DAY 30 MINUTES TO 1 HOUR  BEFORE A MEAL  . omeprazole (PRILOSEC) 40 MG capsule TAKE 1 CAPSULE(40 MG) BY MOUTH DAILY BEFORE BREAKFAST (Patient taking differently: Take 40 mg by mouth daily before breakfast. )  . promethazine (PHENERGAN) 25 MG tablet Take 1 tablet (25 mg total) by mouth every 8 (eight) hours as needed for nausea or vomiting. (Patient not taking: Reported on 12/19/2019)  . Semaglutide,0.25 or 0.5MG/DOS, (OZEMPIC, 0.25 OR 0.5 MG/DOSE,) 2 MG/1.5ML SOPN Inject 0.5 mg into the skin once a week.  . spironolactone (ALDACTONE) 50 MG tablet TAKE 1 TABLET(50 MG) BY MOUTH  DAILY (Patient taking differently: Take 50 mg by mouth daily. )  . traZODone (DESYREL) 150 MG tablet Take 150 mg by mouth at bedtime.   . vitamin E 180 MG (400 UNITS) capsule Take 400 Units by mouth daily.   No facility-administered encounter medications on file as of 05/08/2020.     Goals Addressed            This Visit's Progress   . Assist patient to become more knowledgable of Advance Directives   On track    [place] (see longtitudinal plan of care for additional care plan information)  Current Barriers:  . Limited education about the importance of naming a healthcare power of attorney . Upcoming back surgery . 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 New 04/21/20 Over the next 60 days the patient will attend office visit to meet with SW to review Advance Directive packet and gain better understanding of importance of completion  CCM SW Interventions: Completed 05/08/20 . Successful outbound call placed to the patient . Scheduled an office visit for August 11 at 10:00 am to assist patient with completion of Advance Directive packet  Patient Self Care Activities:  . Is able to readily make contact with social support system . Can identify next of kin, power or attorney, guardian, or primary caregiver . Attends all scheduled provider appointments . Calls provider office for new concerns or questions            Follow Up Plan: Appointment scheduled for SW to meet with client in provider office on: August 11th.   Daneen Schick, BSW, CDP Social Worker, Certified Dementia Practitioner [place] [facility] [place] Management [phone]  Total time spent performing care coordination and/or care management activities with the patient by phone or  face to face = 8 minutes.

## 2020-05-15 ENCOUNTER — Telehealth: Payer: Medicaid Other

## 2020-05-15 NOTE — Chronic Care Management (AMB) (Deleted)
Chronic Care Management Pharmacy  Name: Kara Kelley  MRN: [id number] DOB: March 15, 1956  Chief Complaint/ HPI  Kara Kelley,  64 y.o. , female presents for their Initial CCM visit with the clinical pharmacist via telephone due to COVID-19 Pandemic.  PCP : [name], FNP  Their chronic conditions include: Hypertension, Diabetes and GERD  Office Visits: 03/12/20 OV: BP fairly controlled. Referred to ENT for hearing loss in right ear. 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 HgbA1c stable at 7.2%. Kidney function stable. Liver enzymes slightly elevated. Inflammatory markers normal.   12/19/19 OV: Kidney function improved. Cholesterol levels normal. HgbA1c stable. Interested in starting [place] due to insurance not covering [place]. Referred for diagnostic mammogram  Due to right breast pain/tenderness. Cyclobenzaprine 34m started for tension headache.   Consult Visits:  CCM Encounters:  Medications: Outpatient Encounter Medications as of 05/15/2020  Medication Sig  . Accu-Chek FastClix Lancets MISC USE TO CHECK BLOOD GLUCOSE THREE TIMES DAILY  . ACCU-CHEK GUIDE test strip USE TO TEST FOUR TIMES DAILY  . albuterol (PROVENTIL HFA;VENTOLIN HFA) 108 (90 Base) MCG/ACT inhaler Inhale 2 puffs into the lungs every 6 (six) hours as needed. For shortness of breath. (Patient taking differently: Inhale 2 puffs into the lungs every 6 (six) hours as needed for wheezing or shortness of breath. )  . ARIPiprazole (ABILIFY) 10 MG tablet Take 10 mg by mouth daily.   .Marland Kitchenatorvastatin (LIPITOR) 10 MG tablet Take 1 tablet (10 mg total) by mouth daily.  . BD PEN NEEDLE NANO 2ND GEN 32G X 4 MM MISC USE THREE TIMES DAILY AS DIRECTED  . Blood Glucose Monitoring Suppl (ACCU-CHEK NANO SMARTVIEW) w/Device KIT 1 each by Does not apply route 3 (three) times daily.  .Marland KitchenbuPROPion (WELLBUTRIN XL) 300 MG 24 hr tablet Take 300 mg by mouth daily.   . Cholecalciferol (VITAMIN D) 50 MCG (2000 UT) CAPS  Take 2,000 Units by mouth daily.  . cyclobenzaprine (FLEXERIL) 5 MG tablet Take 1 tablet (5 mg total) by mouth 3 (three) times daily as needed for muscle spasms. (Patient not taking: Reported on 03/25/2020)  . diclofenac Sodium (VOLTAREN) 1 % GEL Apply 2 g topically 2 (two) times daily as needed (pain).  .Marland KitchenEVENING PRIMROSE OIL PO Take 1,000 mg by mouth daily.  .Marland Kitchengabapentin (NEURONTIN) 300 MG capsule Take 300 mg by mouth 3 (three) times daily as needed (pain).  . insulin detemir (LEVEMIR FLEXTOUCH) 100 UNIT/ML FlexPen Inject 30 Units into the skin at bedtime.  . lidocaine (LIDODERM) 5 % Place 1 patch onto the skin daily. Remove & Discard patch within 12 hours or as directed by MD (Patient taking differently: Place 1 patch onto the skin daily as needed (pain). Remove & Discard patch within 12 hours or as directed by MD)  . LINZESS 145 MCG CAPS capsule Take 145 mcg by mouth daily before breakfast.   . liraglutide (VICTOZA) 18 MG/3ML SOPN Inject 0.3 mLs (1.8 mg total) into the skin daily. (Patient not taking: Reported on 03/25/2020)  . lisinopril (ZESTRIL) 10 MG tablet TAKE 1 TABLET(10 MG) BY MOUTH DAILY (Patient taking differently: Take 10 mg by mouth daily. )  . Magnesium 200 MG TABS Take one tablet by mouth with evening meal. (Patient not taking: Reported on 12/19/2019)  . metFORMIN (GLUCOPHAGE) 500 MG tablet TAKE 1 TABLET(500 MG) BY MOUTH TWICE DAILY WITH A MEAL (Patient not taking: Reported on 03/19/2020)  . NOVOLOG FLEXPEN 100 UNIT/ML FlexPen Inject 18 Units into  the skin 3 (three) times daily with meals.  . Omega 3 1200 MG CAPS Take 1,200 mg by mouth daily.  [name] omeprazole (PRILOSEC) 20 MG capsule TAKE 1 CAPSULE BY MOUTH EVERY DAY 30 MINUTES TO 1 HOUR BEFORE A MEAL  . omeprazole (PRILOSEC) 40 MG capsule TAKE 1 CAPSULE(40 MG) BY MOUTH DAILY BEFORE BREAKFAST (Patient taking differently: Take 40 mg by mouth daily before breakfast. )  . promethazine (PHENERGAN) 25 MG tablet Take 1 tablet (25 mg total) by  mouth every 8 (eight) hours as needed for nausea or vomiting. (Patient not taking: Reported on 12/19/2019)  . Semaglutide,0.25 or 0.5MG/DOS, (OZEMPIC, 0.25 OR 0.5 MG/DOSE,) 2 MG/1.5ML SOPN Inject 0.5 mg into the skin once a week.  . spironolactone (ALDACTONE) 50 MG tablet TAKE 1 TABLET(50 MG) BY MOUTH DAILY (Patient taking differently: Take 50 mg by mouth daily. )  . traZODone (DESYREL) 150 MG tablet Take 150 mg by mouth at bedtime.   . vitamin E 180 MG (400 UNITS) capsule Take 400 Units by mouth daily.   No facility-administered encounter medications on file as of 05/15/2020.    Current Diagnosis/Assessment:    Goals Addressed   None     Diabetes   A1c goal <7%  Recent Relevant Labs: Lab Results  Component Value Date/Time   HGBA1C 7.2 (H) 03/12/2020 11:40 AM   HGBA1C 7.1 (H) 12/19/2019 09:33 AM   MICROALBUR 80 06/19/2019 12:50 PM   MICROALBUR 0.50 06/10/2014 02:30 PM   MICROALBUR 5.80 (H) 10/22/2010 08:34 PM    Last diabetic Eye exam:  Lab Results  Component Value Date/Time   HMDIABEYEEXA No Retinopathy 12/10/2019 12:00 AM    Last diabetic Foot exam: Not on file   Checking BG: {CHL HP Blood Glucose Monitoring [phone]}  Recent FBG Readings: *** Recent pre-meal BG readings:  Recent 2hr PP BG readings:   Recent HS BG readings:   Patient has failed these meds in past: *** Patient is currently {CHL Controlled/[phone]} on the following medications: . Ozempic 0.93m weekly . Novolog 18 units three time daily . Metformin 5050mtwice daily . Victoza 1.23m51mnce daily . Levemir 30 units at bedtime  We discussed:  .   Plan Continue {CHL HP Upstream Pharmacy Plans:[phone]}   Hyperlipidemia   LDL goal < ***  Lipid Panel     Component Value Date/Time   CHOL 110 12/19/2019 0933   TRIG 71 12/19/2019 0933   HDL 49 12/19/2019 0933   LDLCALC 46 12/19/2019 0933    Hepatic Function Latest Ref Rng & Units 03/12/2020 12/19/2019 09/23/2019    Total Protein 6.0 - 8.5 g/dL 7.1 7.0 7.2  Albumin 3.8 - 4.8 g/dL 4.3 4.3 -  AST 0 - 40 IU/L '24 17 17  ' ALT 0 - 32 IU/L '28 11 16  ' Alk Phosphatase 48 - 121 IU/L 135(H) 123(H) -  Total Bilirubin 0.0 - 1.2 mg/dL <0.2 0.2 0.3     The ASCVD Risk score (GofBlacklakeet al., 2013) failed to calculate for the following reasons:   The valid total cholesterol range is 130 to 320 mg/dL   Patient has failed these meds in past: *** Patient is currently {CHL Controlled/[phone]} on the following medications:  . ***  We discussed:   .   Plan Continue {CHL HP Upstream Pharmacy Plans:[phone]}   Hypertension   BP goal is:  {CHL HP UPSTREAM Pharmacist BP ranges:[phone]}  Office blood pressures are  BP Readings from Last 3 Encounters:  04/04/20 (!) 103/57  03/31/20 (!) 150/82  03/12/20 130/80   Patient checks BP at home {CHL HP BP Monitoring Frequency:[phone]} Patient home BP readings are ranging: ***  Patient has failed these meds in the past: *** Patient is currently {CHL Controlled/Uncontrolled:[id number]} on the following medications:  . ***  We discussed: .   Plan Continue {CHL HP Upstream Pharmacy Plans:[id number]}   Osteopenia / Osteoporosis   Last DEXA Scan: ***   T-Score femoral neck: ***  T-Score total hip: ***  T-Score lumbar spine: ***  T-Score forearm radius: ***  10-year probability of major osteoporotic fracture: ***  10-year probability of hip fracture: ***  Vit D, 25-Hydroxy  Date Value Ref Range Status  05/15/2009 53 30 - 89 ng/mL Final    Comment:    See lab report for associated comment(s)     Patient {is;is not an osteoporosis candidate:23886}  Patient has failed these meds in past: *** Patient is currently {CHL Controlled/Uncontrolled:4755424016} on the following medications:  . ***  We discussed:  {Osteoporosis Counseling:23892}  Plan Continue {CHL HP Upstream Pharmacy Plans:7051318569}  Health Maintenance   Patient  is currently on the following medications:  . ***  We discussed:   .   Plan Continue {CHL HP Upstream Pharmacy FQPJD:[id number]}   Vaccines   Reviewed and discussed patient's vaccination history.    Immunization History  Administered Date(s) Administered  . Influenza,inj,Quad PF,6+ Mos 06/27/2014, 06/19/2019  . PFIZER SARS-COV-2 Vaccination 12/30/2019, 01/22/2020  . Tdap 08/02/2012    Plan Recommended patient receive *** vaccine in *** office/pharmacy.   Medication Management   Pt uses [facility] for all medications Uses pill box? {Yes or If no, why not?:20788} Pt endorses ***% compliance  We discussed: *** . Importance of taking each medications daily as directed  Plan {US Pharmacy IZIQ:76066}    Follow up: *** month phone visit  Jannette Fogo, PharmD Clinical Pharmacist [facility] [phone]

## 2020-05-20 ENCOUNTER — Ambulatory Visit: Payer: Medicaid Other

## 2020-05-21 ENCOUNTER — Telehealth: Payer: Self-pay

## 2020-05-21 ENCOUNTER — Telehealth: Payer: Medicaid Other

## 2020-05-21 NOTE — Telephone Encounter (Signed)
  Chronic Care Management   Outreach Note  05/21/2020 Name: Kara Kelley MRN: [id number] DOB: 05/07/1956  Referred by: Arnette Felts, FNP Reason for referral : No chief complaint on file.   An unsuccessful telephone outreach was attempted today. The patient was referred to the case management team for assistance with care management and care coordination.   Follow Up Plan: A HIPPA compliant phone message was left for the patient providing contact information and requesting a return call.  Telephone follow up appointment with care management team member scheduled for: 06/29/20  [name], RN, BSN, CCM Care Management Coordinator [facility] Care Management/[facility]  Direct Phone: [phone]

## 2020-05-22 ENCOUNTER — Ambulatory Visit: Payer: Medicaid Other

## 2020-05-22 DIAGNOSIS — E1122 Type 2 diabetes mellitus with diabetic chronic kidney disease: Secondary | ICD-10-CM

## 2020-05-22 DIAGNOSIS — N1832 Chronic kidney disease, stage 3b: Secondary | ICD-10-CM

## 2020-05-22 DIAGNOSIS — I10 Essential (primary) hypertension: Secondary | ICD-10-CM

## 2020-05-22 NOTE — Patient Instructions (Signed)
Social Worker Visit Information  Goals we discussed today:  Goals Addressed            This Visit's Progress   . COMPLETED: Assist patient to become more knowledgable of Advance Directives       CARE PLAN ENTRY (see longtitudinal plan of care for additional care plan information)  Current Barriers:  . Limited education about the importance of naming a healthcare power of attorney . Upcoming back surgery . 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 New 04/21/20 Over the next 60 days the patient will attend office visit to meet with SW to review Advance Directive packet and gain better understanding of importance of completion patient did not show for appointment  CCM SW Interventions: Completed 05/22/20 . Successful outbound call placed to the patient to discuss missed appointment planned for August 11 . Discussed plan for the patient to obtain Advance Directive form during next primary care provider appointment on 9/14 . Collaboration with Arnette Felts, FNP to request patient be provided an Advance Directive packet at next office visit . Goal closed  Patient Self Care Activities:  . Is able to readily make contact with social support system . Can identify next of kin, power or attorney, guardian, or primary caregiver . Attends all scheduled provider appointments . Calls provider office for new concerns or questions            Follow Up Plan: No SW follow up planned at this time. The patient will remain active with RN Care Manager.   Bevelyn Ngo, BSW, CDP Social Worker, Certified Dementia Practitioner [facility] Care Management [phone]

## 2020-05-22 NOTE — Chronic Care Management (AMB) (Signed)
Chronic Care Management    Social Work Follow Up Note  05/22/2020 Name: Kara Kelley MRN: [id number] DOB: 1956-05-09  Kara Kelley is a 64 y.o. year old female who is a primary care patient of Kara Kelley, [place]. The CCM team was consulted for assistance with care coordination.   Review of patient status, including review of consultants reports, other relevant assessments, and collaboration with appropriate care team members and the patient's provider was performed as part of comprehensive patient evaluation and provision of chronic care management services.    SDOH (Social Determinants of Health) assessments performed: No.    Outpatient Encounter Medications as of 05/22/2020  Medication Sig  . Accu-Chek FastClix Lancets MISC USE TO CHECK BLOOD GLUCOSE THREE TIMES DAILY  . ACCU-CHEK GUIDE test strip USE TO TEST FOUR TIMES DAILY  . albuterol (PROVENTIL HFA;VENTOLIN HFA) 108 (90 Base) MCG/ACT inhaler Inhale 2 puffs into the lungs every 6 (six) hours as needed. For shortness of breath. (Patient taking differently: Inhale 2 puffs into the lungs every 6 (six) hours as needed for wheezing or shortness of breath. )  . ARIPiprazole (ABILIFY) 10 MG tablet Take 10 mg by mouth daily.   [name] atorvastatin (LIPITOR) 10 MG tablet Take 1 tablet (10 mg total) by mouth daily.  . BD PEN NEEDLE NANO 2ND GEN 32G X 4 MM MISC USE THREE TIMES DAILY AS DIRECTED  . Blood Glucose Monitoring Suppl (ACCU-CHEK NANO SMARTVIEW) w/Device KIT 1 each by Does not apply route 3 (three) times daily.  [name] buPROPion (WELLBUTRIN XL) 300 MG 24 hr tablet Take 300 mg by mouth daily.   . Cholecalciferol (VITAMIN D) 50 MCG (2000 UT) CAPS Take 2,000 Units by mouth daily.  . cyclobenzaprine (FLEXERIL) 5 MG tablet Take 1 tablet (5 mg total) by mouth 3 (three) times daily as needed for muscle spasms. (Patient not taking: Reported on 03/25/2020)  . diclofenac Sodium (VOLTAREN) 1 % GEL Apply 2 g topically 2 (two) times daily as needed  (pain).  [name] EVENING PRIMROSE OIL PO Take 1,000 mg by mouth daily.  [name] gabapentin (NEURONTIN) 300 MG capsule Take 300 mg by mouth 3 (three) times daily as needed (pain).  . insulin detemir (LEVEMIR FLEXTOUCH) 100 UNIT/ML FlexPen Inject 30 Units into the skin at bedtime.  . lidocaine (LIDODERM) 5 % Place 1 patch onto the skin daily. Remove & Discard patch within 12 hours or as directed by MD (Patient taking differently: Place 1 patch onto the skin daily as needed (pain). Remove & Discard patch within 12 hours or as directed by MD)  . LINZESS 145 MCG CAPS capsule Take 145 mcg by mouth daily before breakfast.   . liraglutide (VICTOZA) 18 MG/3ML SOPN Inject 0.3 mLs (1.8 mg total) into the skin daily. (Patient not taking: Reported on 03/25/2020)  . lisinopril (ZESTRIL) 10 MG tablet TAKE 1 TABLET(10 MG) BY MOUTH DAILY (Patient taking differently: Take 10 mg by mouth daily. )  . Magnesium 200 MG TABS Take one tablet by mouth with evening meal. (Patient not taking: Reported on 12/19/2019)  . metFORMIN (GLUCOPHAGE) 500 MG tablet TAKE 1 TABLET(500 MG) BY MOUTH TWICE DAILY WITH A MEAL (Patient not taking: Reported on 03/19/2020)  . NOVOLOG FLEXPEN 100 UNIT/ML FlexPen Inject 18 Units into the skin 3 (three) times daily with meals.  . Omega 3 1200 MG CAPS Take 1,200 mg by mouth daily.  [name] omeprazole (PRILOSEC) 20 MG capsule TAKE 1 CAPSULE BY MOUTH EVERY DAY 30 MINUTES TO 1 HOUR  BEFORE A MEAL  . omeprazole (PRILOSEC) 40 MG capsule TAKE 1 CAPSULE(40 MG) BY MOUTH DAILY BEFORE BREAKFAST (Patient taking differently: Take 40 mg by mouth daily before breakfast. )  . promethazine (PHENERGAN) 25 MG tablet Take 1 tablet (25 mg total) by mouth every 8 (eight) hours as needed for nausea or vomiting. (Patient not taking: Reported on 12/19/2019)  . Semaglutide,0.25 or 0.5MG/DOS, (OZEMPIC, 0.25 OR 0.5 MG/DOSE,) 2 MG/1.5ML SOPN Inject 0.5 mg into the skin once a week.  . spironolactone (ALDACTONE) 50 MG tablet TAKE 1 TABLET(50 MG) BY  MOUTH DAILY (Patient taking differently: Take 50 mg by mouth daily. )  . traZODone (DESYREL) 150 MG tablet Take 150 mg by mouth at bedtime.   . vitamin E 180 MG (400 UNITS) capsule Take 400 Units by mouth daily.   No facility-administered encounter medications on file as of 05/22/2020.     Goals Addressed            This Visit's Progress   . COMPLETED: Assist patient to become more knowledgable of Advance Directives       CARE PLAN ENTRY (see longtitudinal plan of care for additional care plan information)  Current Barriers:  . Limited education about the importance of naming a healthcare power of attorney . Upcoming back surgery . 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 New 04/21/20 Over the next 60 days the patient will attend office visit to meet with SW to review Advance Directive packet and gain better understanding of importance of completion patient did not show for appointment  CCM SW Interventions: Completed 05/22/20 . Successful outbound call placed to the patient to discuss missed appointment planned for August 11 . Discussed plan for the patient to obtain Advance Directive form during next primary care provider appointment on 9/14 . Collaboration with Kara Brine, FNP to request patient be provided an Advance Directive packet at next office visit . Goal closed  Patient Self Care Activities:  . Is able to readily make contact with social support system . Can identify next of kin, power or attorney, guardian, or primary caregiver . Attends all scheduled provider appointments . Calls provider office for new concerns or questions            Follow Up Plan: No SW follow up planned at this time due to patient inability to progress  through SW goal. The patient will remain engaged with Consulting civil engineer.  Kara Kelley, BSW, CDP Social Worker, Certified Dementia Practitioner [facility] [phone]

## 2020-05-27 ENCOUNTER — Ambulatory Visit: Payer: Medicaid Other | Admitting: Skilled Nursing Facility1

## 2020-06-01 ENCOUNTER — Telehealth: Payer: Self-pay | Admitting: *Deleted

## 2020-06-01 NOTE — Chronic Care Management (AMB) (Signed)
°  Care Management   Note  06/01/2020 Name: Kara Kelley MRN: [id number] DOB: 04/05/1956  Kara Kelley is a 64 y.o. year old female who is a primary care patient of Arnette Felts, FNP and is actively engaged with the care management team. I reached out to Kara Kelley by phone today to assist with re-scheduling an initial visit with the Pharmacist.  Follow up plan: Unsuccessful telephone outreach attempt made.The care management team will reach out to the patient again over the next 7 days. If patient returns call to provider office, please advise to call Embedded Care Management Care Guide Gwenevere Ghazi at [phone]  [employer] [facility] [employer] Guide, Embedded Care Coordination [facility]  [place], [place] [address] Direct Dial: [phone] [email or web address] Website: [email or web address]

## 2020-06-02 ENCOUNTER — Other Ambulatory Visit: Payer: Self-pay

## 2020-06-02 DIAGNOSIS — E1165 Type 2 diabetes mellitus with hyperglycemia: Secondary | ICD-10-CM

## 2020-06-02 MED ORDER — OZEMPIC (0.25 OR 0.5 MG/DOSE) 2 MG/1.5ML ~~LOC~~ SOPN: 0.5000 mg | PEN_INJECTOR | SUBCUTANEOUS | 1 refills | Status: DC

## 2020-06-08 NOTE — Chronic Care Management (AMB) (Signed)
  Care Management   Note  06/08/2020 Name: Kara Kelley MRN: [id number] DOB: 15-Aug-1956  Kara Kelley is a 64 y.o. year old female who is a primary care patient of Arnette Felts, FNP and is actively engaged with the care management team. I reached out to Kara Kelley by phone today to assist with re-scheduling an initial visit with the Pharmacist.  Follow up plan: Telephone appointment with care management team member scheduled for: 06/23/2020  Gwenevere Ghazi  Care Guide, Embedded Care Coordination [facility]  [place], [place] [address] Direct Dial: [phone] [email or web address] Website: [email or web address]

## 2020-06-10 ENCOUNTER — Other Ambulatory Visit: Payer: Self-pay | Admitting: Nurse Practitioner

## 2020-06-10 DIAGNOSIS — K219 Gastro-esophageal reflux disease without esophagitis: Secondary | ICD-10-CM

## 2020-06-23 ENCOUNTER — Ambulatory Visit: Payer: Medicaid Other | Admitting: Nurse Practitioner

## 2020-06-23 ENCOUNTER — Ambulatory Visit: Payer: Medicaid Other

## 2020-06-23 ENCOUNTER — Encounter: Payer: Self-pay | Admitting: Nurse Practitioner

## 2020-06-23 ENCOUNTER — Other Ambulatory Visit: Payer: Self-pay

## 2020-06-23 VITALS — BP 122/78 | HR 80 | Temp 98.1°F | Ht 60.6 in | Wt 212.2 lb

## 2020-06-23 DIAGNOSIS — N1832 Chronic kidney disease, stage 3b: Secondary | ICD-10-CM

## 2020-06-23 DIAGNOSIS — E1121 Type 2 diabetes mellitus with diabetic nephropathy: Secondary | ICD-10-CM

## 2020-06-23 DIAGNOSIS — I10 Essential (primary) hypertension: Secondary | ICD-10-CM | POA: Diagnosis not present

## 2020-06-23 DIAGNOSIS — Z794 Long term (current) use of insulin: Secondary | ICD-10-CM

## 2020-06-23 DIAGNOSIS — Z0000 Encounter for general adult medical examination without abnormal findings: Secondary | ICD-10-CM

## 2020-06-23 DIAGNOSIS — Z20822 Contact with and (suspected) exposure to covid-19: Secondary | ICD-10-CM

## 2020-06-23 DIAGNOSIS — E1122 Type 2 diabetes mellitus with diabetic chronic kidney disease: Secondary | ICD-10-CM

## 2020-06-23 DIAGNOSIS — Z23 Encounter for immunization: Secondary | ICD-10-CM | POA: Diagnosis not present

## 2020-06-23 DIAGNOSIS — Z79899 Other long term (current) drug therapy: Secondary | ICD-10-CM

## 2020-06-23 DIAGNOSIS — B182 Chronic viral hepatitis C: Secondary | ICD-10-CM

## 2020-06-23 LAB — POCT UA - MICROALBUMIN
Creatinine, POC: 100 mg/dL
Microalbumin Ur, POC: 30 mg/L

## 2020-06-23 LAB — POCT URINALYSIS DIPSTICK
Bilirubin, UA: NEGATIVE
Glucose, UA: NEGATIVE
Ketones, UA: NEGATIVE
Nitrite, UA: NEGATIVE
Protein, UA: NEGATIVE
Spec Grav, UA: 1.01 (ref 1.010–1.025)
Urobilinogen, UA: 0.2 E.U./dL
pH, UA: 5.5 (ref 5.0–8.0)

## 2020-06-23 NOTE — Chronic Care Management (AMB) (Signed)
Chronic Care Management Pharmacy  Name: SHALAUNDA WEATHERHOLTZ  MRN: [id number] DOB: 09/08/1956  Chief Complaint/ HPI  [name],  64 y.o. , female presents for their Initial CCM visit with the clinical pharmacist In office.  PCP : [name], FNP  Their chronic conditions include: Hypertension and Diabetes  Office Visits: 03/12/20 OV: HgbA1c stable at 7.2%. Pain in both hands. Inflammatory markers normal. Kidney function stable. Liver enzymes slightly elevated. BP fairly well controlled. Referred to ENT for complaint of hearing loss in right ear.   12/19/19 OV: Presented with complaints of right breast pain and headaches. DM follow up. HgbA1c table at 7.1%. Pt interested in starting Victoza due to insurance not covering [place]. Kidney function better, increase water intake. Mammogram ordered for right breast pain. Start cyclobenzaprine 69m three times daily as needed for muscle spasms for tension in neck that may be causing headache.   Consult Visits: 04/03/20 L4-S1 decompression and fusion.   03/07/20 ED visit: Presented after fall last night. Xrays negative for fracture. Use lidocaine patches, Tylenol, and NSAIDs.  01/02/20 Podiatry OV w/ Dr. WJacqualyn Posey Toenails debrided. Hyperkeratotic lesion subdebrided.   CCM Encounters:  Medications: Outpatient Encounter Medications as of 06/23/2020  Medication Sig  . insulin detemir (LEVEMIR FLEXTOUCH) 100 UNIT/ML FlexPen Inject 30 Units into the skin at bedtime.  .Marland KitchenNOVOLOG FLEXPEN 100 UNIT/ML FlexPen Inject 18 Units into the skin 3 (three) times daily with meals.  . Semaglutide,0.25 or 0.5MG/DOS, (OZEMPIC, 0.25 OR 0.5 MG/DOSE,) 2 MG/1.5ML SOPN Inject 0.375 mLs (0.5 mg total) into the skin once a week.  . Accu-Chek FastClix Lancets MISC USE TO CHECK BLOOD GLUCOSE THREE TIMES DAILY  . ACCU-CHEK GUIDE test strip USE TO TEST FOUR TIMES DAILY  . albuterol (PROVENTIL HFA;VENTOLIN HFA) 108 (90 Base) MCG/ACT inhaler Inhale 2 puffs into the lungs  every 6 (six) hours as needed. For shortness of breath. (Patient taking differently: Inhale 2 puffs into the lungs every 6 (six) hours as needed for wheezing or shortness of breath. )  . ARIPiprazole (ABILIFY) 10 MG tablet Take 10 mg by mouth daily.   . BD PEN NEEDLE NANO 2ND GEN 32G X 4 MM MISC USE THREE TIMES DAILY AS DIRECTED  . Blood Glucose Monitoring Suppl (ACCU-CHEK NANO SMARTVIEW) w/Device KIT 1 each by Does not apply route 3 (three) times daily.  .Marland KitchenbuPROPion (WELLBUTRIN XL) 300 MG 24 hr tablet Take 300 mg by mouth daily.   . Cholecalciferol (VITAMIN D) 50 MCG (2000 UT) CAPS Take 2,000 Units by mouth daily.  . cyclobenzaprine (FLEXERIL) 5 MG tablet Take 1 tablet (5 mg total) by mouth 3 (three) times daily as needed for muscle spasms.  . diclofenac Sodium (VOLTAREN) 1 % GEL Apply 2 g topically 2 (two) times daily as needed (pain). (Patient not taking: Reported on 06/23/2020)  . EVENING PRIMROSE OIL PO Take 1,000 mg by mouth daily.  .Marland Kitchengabapentin (NEURONTIN) 300 MG capsule Take 300 mg by mouth 3 (three) times daily as needed (pain).  .Marland Kitchenlidocaine (LIDODERM) 5 % Place 1 patch onto the skin daily. Remove & Discard patch within 12 hours or as directed by MD (Patient not taking: Reported on 06/23/2020)  . LINZESS 145 MCG CAPS capsule Take 145 mcg by mouth daily before breakfast.   . lisinopril (ZESTRIL) 10 MG tablet TAKE 1 TABLET(10 MG) BY MOUTH DAILY (Patient taking differently: Take 10 mg by mouth daily. )  . Omega 3 1200 MG CAPS Take 1,200 mg by mouth daily.  .[name]  omeprazole (PRILOSEC) 40 MG capsule TAKE 1 CAPSULE(40 MG) BY MOUTH DAILY BEFORE BREAKFAST  . oxyCODONE-acetaminophen (PERCOCET/ROXICET) 5-325 MG tablet Take 1 tablet by mouth every 4 (four) hours as needed for severe pain.  [name] spironolactone (ALDACTONE) 50 MG tablet TAKE 1 TABLET(50 MG) BY MOUTH DAILY (Patient taking differently: Take 50 mg by mouth daily. )  . traZODone (DESYREL) 150 MG tablet Take 150 mg by mouth at bedtime.   . vitamin E  180 MG (400 UNITS) capsule Take 400 Units by mouth daily.   No facility-administered encounter medications on file as of 06/23/2020.    Current Diagnosis/Assessment:  SDOH Interventions     Most Recent Value  SDOH Interventions  Financial Strain Interventions Intervention Not Indicated  [Medications are affordable through Medicaid]      Goals Addressed            This Visit's Progress   . Pharmacy Care Plan       CARE PLAN ENTRY (see longitudinal plan of care for additional care plan information)  Current Barriers:  . Chronic Disease Management support, education, and care coordination needs related to Diabetes   Diabetes Lab Results  Component Value Date/Time   HGBA1C 7.2 (H) 03/12/2020 11:40 AM   HGBA1C 7.1 (H) 12/19/2019 09:33 AM   . Pharmacist Clinical Goal(s): o Over the next 90 days, patient will work with PharmD and providers to achieve A1c goal <7% . Current regimen:  o Levemir 30 units at bedtime o Novolog 18 units three times daily with meals o Ozempic 0.36m weekly . Interventions: o Provided dietary and exercise recommendations o Provided Daily Diabetes Meal Planning Guide while in office o Discussed appropriate goal fasting blood sugar (80-130) o Discussed appropriate goal for Hemoglobin A1c (less than 7%) o Provided patient education regarding the importance of eating when administering Novolog ("mealtime" insulin) o Determined patient is able to receive Ozempic from local pharmacy through insurance . Patient self care activities - Over the next 90 days, patient will: o Check blood sugar twice daily, document, and provide at future appointments o Contact provider with any episodes of hypoglycemia o Focus on eating consistent, well-balanced meals following the PLATE method (information provided) o Limit serving sizes of carbohydrates (rice, bread, pasta, potatoes, etc) o Limit sweets and sugary snacks o Start exercising for 15 minutes 2-3 times weekly  with a goal of 30 minutes 5 times weekly  Medication management . Pharmacist Clinical Goal(s): o Over the next 180 days, patient will work with PharmD and providers to maintain optimal medication adherence . Current pharmacy: [facility] . Interventions o Comprehensive medication review performed. o Continue current medication management strategy o Discussed medication synchronization, adherence packaging and delivery available with [facility] . Patient self care activities - Over the next 180 days, patient will: o Focus on medication adherence by use of pill box o Take medications as prescribed o Report any questions or concerns to PharmD and/or provider(s) o Contact PharmD if interested in utilizing services provided by [facility] pharmacy  Initial goal documentation        Diabetes   A1c goal <7%  Recent Relevant Labs: Lab Results  Component Value Date/Time   HGBA1C 7.2 (H) 03/12/2020 11:40 AM   HGBA1C 7.1 (H) 12/19/2019 09:33 AM   MICROALBUR 30 06/23/2020 09:53 AM   MICROALBUR 80 06/19/2019 12:50 PM    Last diabetic Eye exam:  Lab Results  Component Value Date/Time   HMDIABEYEEXA No Retinopathy 12/10/2019 12:00 AM    Last diabetic  Foot exam: No results found for: HMDIABFOOTEX   Checking BG: 2-3 times daily  Recent FBG Readings: 134 Recent pre-meal BG readings: 151 Recent 2hr PP BG readings:   Recent HS BG readings:   Patient has failed these meds in past: Tradjenta, Victoza, Metformin,  Patient is currently uncontrolled on the following medications: . Levemir 30 units at bedtime . Novolog 18 units three times daily with meals . Ozempic 0.65m weekly  We discussed:  . Pt states that her typical BG readings are 125-130 . Pt mentioned having a low BG reading of 50 a few weeks ago (took [employer] but did not eat) o Educated pt on the importance of eating when taking Novolog (mealtime insulin) o Pt states that she usually skips Novolog if she is not going to  eat o Advised pt on the importance of eating a consistent and balanced diet to stabilize blood sugar . Determined pt is still using Ozempic and it is covered by her insurance o Not on Medicaid's preferred drug list, but has been approved for this patient o Discussed dosing of Ozempic (supposed to be 0.556mweekly not 0.2544meekly) . FBG 80-130 . HgbA1c goal less than 7% . Diet extensively o Pt states that she has cut back on sweets and chips o Her diet has improved since moving into her own home (was staying with daughter for a while) o She mentions not having much of an appetite - Discussed the importance of eating consistent and well-balanced meals (even if they are small)  o Pt eats a variety of foods including fried chicken and fish (sometimes baked), hot dogs, shrimp, steamed broccoli, fruit, peanut butter o Recommend pt limit serving of carbohydrates o Discussed limiting portion sizes of unhealthy foods o Pt mentions that she loves chocolate - Recommend small amounts of dark chocolate o Provided Daily Diabetes Meal Planning Guide while in office today . Exercise extensively o Pt is walking some now, but not very much o [facility] and is going to start walking and going to water aerobics with her sister o Discussed the benefits of exercise to decrease HgbA1c o Recommend pt get 30 minutes of moderate intensity exercise daily 5 times a week (150 minutes total per week) - Advised pt to work up to exercise goal. Start slow with 15 minutes 2-3 times weekly  Plan Continue current medications   Medication Management   [facility] Uses pill box? Yes Pt endorses 100% compliance  We discussed:  . Importance of taking each medications daily as directed . Pt stopped smoking 8 months ago [place] Congratulated pt on her accomplishment! . Medication synchronization, adherence packaging, and delivery available with [facility]  pharmacy o Pt interested and will consider. Advised pt to call me if she decides to transfer  Plan Continue current medication management strategy   Follow up: No follow up planned at this time. Provided pt with my phone number to contact with any questions or concerns.   CouJannette FogoharmD Clinical Pharmacist [facility] [phone]

## 2020-06-23 NOTE — Progress Notes (Signed)
I,Yamilka Roman [employer] as a Education administrator for [employer], FNP.,have documented all relevant documentation on the behalf of [name], FNP,as directed by  [name], FNP while in the presence of [name], [place].  This visit occurred during the SARS-CoV-2 public health emergency.  Safety protocols were in place, including screening questions prior to the visit, additional usage of staff PPE, and extensive cleaning of exam room while observing appropriate contact time as indicated for disinfecting solutions.  Subjective:     Patient ID: Kara Kelley , female    DOB: 04-Mar-1956 , 64 y.o.   MRN: [id number]   Chief Complaint  Patient presents with  . Annual Exam    HPI  Here for HM   She had back surgery on July 25th and is doing well.   She was with her daughter at the ER for about 5 days and is concerned she may have been exposed to Covid.    Diabetes She presents for her follow-up diabetic visit. She has type 2 diabetes mellitus. Her disease course has been stable. There are no hypoglycemic associated symptoms. Pertinent negatives for hypoglycemia include no dizziness or headaches. There are no diabetic associated symptoms. Pertinent negatives for diabetes include no chest pain. There are no hypoglycemic complications. Symptoms are stable. There are no diabetic complications. Risk factors for coronary artery disease include obesity. Current diabetic treatment includes oral agent (dual therapy). She is compliant with treatment all of the time. She is following a generally unhealthy diet. When asked about meal planning, she reported none. She never participates in exercise. (This morning her blood sugar was 134 today) She does not see a podiatrist.Eye exam is current.     Past Medical History:  Diagnosis Date  . Anemia   . Anxiety   . Arthritis   . Asthma   . Bunion   . Callus   . Chronic pain   . Cocaine abuse ([place])   . COPD (chronic obstructive pulmonary disease) ([place])    . Corns and callosities   . Degenerative joint disease   . Depression   . Diabetes mellitus   . Endometrial polyp   . ETOH abuse   . Gall stones   . GERD (gastroesophageal reflux disease)   . Headache    history of Migraines  . Hepatitis C    Hep C  . Hyperlipidemia   . Hypertension   . Insomnia   . Spondylolisthesis of lumbar region   . Substance abuse ([place])    alcoholism  . Tuberculosis 1985  . Wears dentures   . Wears glasses      Family History  Problem Relation Age of Onset  . Heart disease Mother   . Diabetes Other        mat great aunt  . Cirrhosis Other        mat great aunt  . Cirrhosis Other        mat great uncles x 2  . Colon cancer Neg Hx   . Breast cancer Neg Hx      Current Outpatient Medications:  .  Accu-Chek FastClix Lancets MISC, USE TO CHECK BLOOD GLUCOSE THREE TIMES DAILY, Disp: 102 each, Rfl: prn .  ACCU-CHEK GUIDE test strip, USE TO TEST FOUR TIMES DAILY, Disp: 300 strip, Rfl: 2 .  albuterol (PROVENTIL HFA;VENTOLIN HFA) 108 (90 Base) MCG/ACT inhaler, Inhale 2 puffs into the lungs every 6 (six) hours as needed. For shortness of breath. (Patient taking differently: Inhale 2 puffs into  the lungs every 6 (six) hours as needed for wheezing or shortness of breath. ), Disp: 18 g, Rfl: 1 .  ARIPiprazole (ABILIFY) 10 MG tablet, Take 10 mg by mouth daily. , Disp: , Rfl:  .  BD PEN NEEDLE NANO 2ND GEN 32G X 4 MM MISC, USE THREE TIMES DAILY AS DIRECTED, Disp: 200 each, Rfl: 3 .  Blood Glucose Monitoring Suppl (ACCU-CHEK NANO SMARTVIEW) w/Device KIT, 1 each by Does not apply route 3 (three) times daily., Disp: 1 kit, Rfl: 0 .  buPROPion (WELLBUTRIN XL) 300 MG 24 hr tablet, Take 300 mg by mouth daily. , Disp: , Rfl: 0 .  Cholecalciferol (VITAMIN D) 50 MCG (2000 UT) CAPS, Take 2,000 Units by mouth daily., Disp: , Rfl:  .  cyclobenzaprine (FLEXERIL) 5 MG tablet, Take 1 tablet (5 mg total) by mouth 3 (three) times daily as needed for muscle spasms., Disp: 30  tablet, Rfl: 1 .  EVENING PRIMROSE OIL PO, Take 1,000 mg by mouth daily., Disp: , Rfl:  .  gabapentin (NEURONTIN) 300 MG capsule, Take 300 mg by mouth 3 (three) times daily as needed (pain)., Disp: , Rfl:  .  insulin detemir (LEVEMIR FLEXTOUCH) 100 UNIT/ML FlexPen, Inject 30 Units into the skin at bedtime., Disp: 15 pen, Rfl: 1 .  LINZESS 145 MCG CAPS capsule, Take 145 mcg by mouth daily before breakfast. , Disp: , Rfl:  .  lisinopril (ZESTRIL) 10 MG tablet, TAKE 1 TABLET(10 MG) BY MOUTH DAILY (Patient taking differently: Take 10 mg by mouth daily. ), Disp: 90 tablet, Rfl: 0 .  NOVOLOG FLEXPEN 100 UNIT/ML FlexPen, Inject 18 Units into the skin 3 (three) times daily with meals., Disp: 15 pen, Rfl: 1 .  Omega 3 1200 MG CAPS, Take 1,200 mg by mouth daily., Disp: , Rfl:  .  omeprazole (PRILOSEC) 40 MG capsule, TAKE 1 CAPSULE(40 MG) BY MOUTH DAILY BEFORE BREAKFAST, Disp: 90 capsule, Rfl: 0 .  oxyCODONE-acetaminophen (PERCOCET/ROXICET) 5-325 MG tablet, Take 1 tablet by mouth every 4 (four) hours as needed for severe pain., Disp: , Rfl:  .  Semaglutide,0.25 or 0.5MG/DOS, (OZEMPIC, 0.25 OR 0.5 MG/DOSE,) 2 MG/1.5ML SOPN, Inject 0.375 mLs (0.5 mg total) into the skin once a week., Disp: 9 mL, Rfl: 1 .  traZODone (DESYREL) 150 MG tablet, Take 150 mg by mouth at bedtime. , Disp: , Rfl:  .  vitamin E 180 MG (400 UNITS) capsule, Take 400 Units by mouth daily., Disp: , Rfl:  .  diclofenac Sodium (VOLTAREN) 1 % GEL, Apply 2 g topically 2 (two) times daily as needed (pain). (Patient not taking: Reported on 06/23/2020), Disp: , Rfl:  .  lidocaine (LIDODERM) 5 %, Place 1 patch onto the skin daily. Remove & Discard patch within 12 hours or as directed by MD (Patient not taking: Reported on 06/23/2020), Disp: 30 patch, Rfl: 0 .  spironolactone (ALDACTONE) 50 MG tablet, TAKE 1 TABLET(50 MG) BY MOUTH DAILY, Disp: 90 tablet, Rfl: 0   No Known Allergies    The patient states she uses post menopausal status for birth  control.Negative for Dysmenorrhea and Negative for Menorrhagia. Negative for: breast discharge, breast lump(s), breast pain and breast self exam. Associated symptoms include abnormal vaginal bleeding. Pertinent negatives include abnormal bleeding (hematology), anxiety, decreased libido, depression, difficulty falling sleep, dyspareunia, history of infertility, nocturia, sexual dysfunction, sleep disturbances, urinary incontinence, urinary urgency, vaginal discharge and vaginal itching. Diet regular; she has been staying off the sweets. The patient states her exercise level is  minimal.     . The patient's tobacco use is:  Social History   Tobacco Use  Smoking Status Former Smoker  . Packs/day: 0.50  . Types: Cigarettes  . Quit date: 2020  . Years since quitting: 1.7  Smokeless Tobacco Never Used  . She has been exposed to passive smoke. The patient's alcohol use is:  Social History   Substance and Sexual Activity  Alcohol Use No  . Alcohol/week: 0.0 standard drinks   Comment: in rehab  . Additional information: Last pap 06/19/2019, next one scheduled for 06/18/2022.     Review of Systems  Constitutional: Negative.   HENT: Negative.   Eyes: Negative.   Respiratory: Negative.   Cardiovascular: Negative.  Negative for chest pain, palpitations and leg swelling.  Gastrointestinal: Negative.   Endocrine: Negative.   Genitourinary: Negative.   Musculoskeletal: Negative.   Skin: Negative.   Allergic/Immunologic: Negative.   Neurological: Negative.  Negative for dizziness and headaches.  Hematological: Negative.   Psychiatric/Behavioral: Negative.      Today's Vitals   06/23/20 0907  BP: 122/78  Pulse: 80  Temp: 98.1 F (36.7 C)  TempSrc: Oral  Weight: 212 lb 3.2 oz (96.3 kg)  Height: 5' 0.6" (1.539 m)  PainSc: 0-No pain   Body mass index is 40.63 kg/m.   Objective:  Physical Exam Constitutional:      General: She is not in acute distress.    Appearance: Normal appearance.  She is well-developed. She is obese.  HENT:     Head: Normocephalic and atraumatic.     Right Ear: Hearing, tympanic membrane, ear canal and external ear normal. There is no impacted cerumen.     Left Ear: Hearing, tympanic membrane, ear canal and external ear normal. There is no impacted cerumen.     Nose:     Comments: Deferred - masked    Mouth/Throat:     Comments: Deferred - masked Eyes:     General: Lids are normal.     Extraocular Movements: Extraocular movements intact.     Conjunctiva/sclera: Conjunctivae normal.     Pupils: Pupils are equal, round, and reactive to light.     Funduscopic exam:    Right eye: No papilledema.        Left eye: No papilledema.  Neck:     Thyroid: No thyroid mass.     Vascular: No carotid bruit.  Cardiovascular:     Rate and Rhythm: Normal rate and regular rhythm.     Pulses: Normal pulses.     Heart sounds: Normal heart sounds. No murmur heard.   Pulmonary:     Effort: Pulmonary effort is normal.     Breath sounds: Normal breath sounds.  Abdominal:     General: Abdomen is flat. Bowel sounds are normal. There is no distension.     Palpations: Abdomen is soft.     Tenderness: There is no abdominal tenderness.  Genitourinary:    Rectum: Guaiac result negative.  Musculoskeletal:        General: No swelling. Normal range of motion.     Cervical back: Full passive range of motion without pain, normal range of motion and neck supple.     Right lower leg: No edema.     Left lower leg: No edema.  Skin:    General: Skin is warm and dry.     Capillary Refill: Capillary refill takes less than 2 seconds.  Neurological:     General: No focal deficit present.  Mental Status: She is alert and oriented to person, place, and time.     Cranial Nerves: No cranial nerve deficit.     Sensory: No sensory deficit.  Psychiatric:        Mood and Affect: Mood normal.        Behavior: Behavior normal.        Thought Content: Thought content normal.         Judgment: Judgment normal.         Assessment And Plan:     1. Encounter for general adult medical examination w/o abnormal findings . Behavior modifications discussed and diet history reviewed.   . Pt will continue to exercise regularly and modify diet with low GI, plant based foods and decrease intake of processed foods.  . Recommend intake of daily multivitamin, Vitamin D, and calcium.  . Recommend mammogram and colonoscopy for preventive screenings, as well as recommend immunizations that include influenza, TDAP  2. Type 2 diabetes mellitus with stage 3b chronic kidney disease, with long-term current use of insulin (HCC)  Chronic, fair control  Continue with current medications  Encouraged to limit intake of sugary foods and drinks  Encouraged to increase physical activity to 150 minutes per week as tolerated - POCT UA - Microalbumin - Hemoglobin A1c - CMP14+EGFR - Lipid panel  3. Essential hypertension . B/P is well controlled.  . CMP ordered to check renal function.  . The importance of regular exercise and dietary modification was stressed to the patient.  . Stressed importance of losing ten percent of her body weight to help with B/P control.  . The weight loss would help with decreasing cardiac and cancer risk as well.  . EKG done with SR with occasional ectopic ventricular beat HR 75 - POCT Urinalysis Dipstick (81002) - EKG 12-Lead  4. Need for influenza vaccination  Influenza vaccine administered  Encouraged to take Tylenol as needed for fever or muscle aches. - Flu Vaccine QUAD 6+ mos PF IM (Fluarix Quad PF)  5. Close exposure to COVID-19 virus  After patient in room with provider she shares she had been around a family member for a brief period of time who had been sick and she wanted to be tested for covid, denied any symptoms.   She is advised to quarantine until she has her results - Novel Coronavirus, NAA ([facility])  6. Chronic hepatitis C without  hepatic coma ([place])  Continue follow up at liver clinic  7. Other long term (current) drug therapy - CBC - TSH     Patient was given opportunity to ask questions. Patient verbalized understanding of the plan and was able to repeat key elements of the plan. All questions were answered to their satisfaction.    Teola Bradley, FNP, have reviewed all documentation for this visit. The documentation on 07/07/20 for the exam, diagnosis, procedures, and orders are all accurate and complete.  THE PATIENT IS ENCOURAGED TO PRACTICE SOCIAL DISTANCING DUE TO THE COVID-19 PANDEMIC.

## 2020-06-23 NOTE — Patient Instructions (Addendum)
Visit Information  Goals Addressed            This Visit's Progress   . Pharmacy Care Plan       CARE PLAN ENTRY (see longitudinal plan of care for additional care plan information)  Current Barriers:  . Chronic Disease Management support, education, and care coordination needs related to Diabetes   Diabetes Lab Results  Component Value Date/Time   HGBA1C 7.2 (H) 03/12/2020 11:40 AM   HGBA1C 7.1 (H) 12/19/2019 09:33 AM   . Pharmacist Clinical Goal(s): o Over the next 90 days, patient will work with PharmD and providers to achieve A1c goal <7% . Current regimen:  o Levemir 30 units at bedtime o Novolog 18 units three times daily with meals o Ozempic 0.5mg  weekly . Interventions: o Provided dietary and exercise recommendations o Provided Daily Diabetes Meal Planning Guide while in office o Discussed appropriate goal fasting blood sugar (80-130) o Discussed appropriate goal for Hemoglobin A1c (less than 7%) o Provided patient education regarding the importance of eating when administering Novolog ("mealtime" insulin) o Determined patient is able to receive Ozempic from local pharmacy through insurance . Patient self care activities - Over the next 90 days, patient will: o Check blood sugar twice daily, document, and provide at future appointments o Contact provider with any episodes of hypoglycemia o Focus on eating consistent, well-balanced meals following the PLATE method (information provided) o Limit serving sizes of carbohydrates (rice, bread, pasta, potatoes, etc) o Limit sweets and sugary snacks o Start exercising for 15 minutes 2-3 times weekly with a goal of 30 minutes 5 times weekly  Medication management . Pharmacist Clinical Goal(s): o Over the next 180 days, patient will work with PharmD and providers to maintain optimal medication adherence . Current pharmacy: [facility] . Interventions o Comprehensive medication review performed. o Continue current  medication management strategy o Discussed medication synchronization, adherence packaging and delivery available with [facility] . Patient self care activities - Over the next 180 days, patient will: o Focus on medication adherence by use of pill box o Take medications as prescribed o Report any questions or concerns to PharmD and/or provider(s) o Contact PharmD if interested in utilizing services provided by [facility]  Initial goal documentation        Kara Kelley was given information about Chronic Care Management services today including:  1. CCM service includes personalized support from designated clinical staff supervised by her physician, including individualized plan of care and coordination with other care providers 2. 24/7 contact phone numbers for assistance for urgent and routine care needs. 3. Standard insurance, coinsurance, copays and deductibles apply for chronic care management only during months in which we provide at least 20 minutes of these services. Most insurances cover these services at 100%, however patients may be responsible for any copay, coinsurance and/or deductible if applicable. This service may help you avoid the need for more expensive face-to-face services. 4. Only one practitioner may furnish and bill the service in a calendar month. 5. The patient may stop CCM services at any time (effective at the end of the month) by phone call to the office staff.  Patient agreed to services and verbal consent obtained.   The patient verbalized understanding of instructions provided today and agreed to receive a mailed copy of patient instruction and/or educational materials. No follow up planned at this time. Advised patient to contact PharmD with any further questions or concerns  Kara Kelley, PharmD Clinical Pharmacist [facility] [phone]   Diabetes Mellitus and Nutrition, Adult When you have diabetes (diabetes  mellitus), it is very important to have healthy eating habits because your blood sugar (glucose) levels are greatly affected by what you eat and drink. Eating healthy foods in the appropriate amounts, at about the same times every day, can help you:  Control your blood glucose.  Lower your risk of heart disease.  Improve your blood pressure.  Reach or maintain a healthy weight. Every person with diabetes is different, and each person has different needs for a meal plan. Your health care provider may recommend that you work with a diet and nutrition specialist (dietitian) to make a meal plan that is best for you. Your meal plan may vary depending on factors such as:  The calories you need.  The medicines you take.  Your weight.  Your blood glucose, blood pressure, and cholesterol levels.  Your activity level.  Other health conditions you have, such as heart or kidney disease. How do carbohydrates affect me? Carbohydrates, also called carbs, affect your blood glucose level more than any other type of food. Eating carbs naturally raises the amount of glucose in your blood. Carb counting is a method for keeping track of how many carbs you eat. Counting carbs is important to keep your blood glucose at a healthy level, especially if you use insulin or take certain oral diabetes medicines. It is important to know how many carbs you can safely have in each meal. This is different for every person. Your dietitian can help you calculate how many carbs you should have at each meal and for each snack. Foods that contain carbs include:  Bread, cereal, rice, pasta, and crackers.  Potatoes and corn.  Peas, beans, and lentils.  Milk and yogurt.  Fruit and juice.  Desserts, such as cakes, cookies, ice cream, and candy. How does alcohol affect me? Alcohol can cause a sudden decrease in blood glucose (hypoglycemia), especially if you use insulin or take certain oral diabetes medicines.  Hypoglycemia can be a life-threatening condition. Symptoms of hypoglycemia (sleepiness, dizziness, and confusion) are similar to symptoms of having too much alcohol. If your health care provider says that alcohol is safe for you, follow these guidelines:  Limit alcohol intake to no more than 1 drink per day for nonpregnant women and 2 drinks per day for men. One drink equals 12 oz of beer, 5 oz of wine, or 1 oz of hard liquor.  Do not drink on an empty stomach.  Keep yourself hydrated with water, diet soda, or unsweetened iced tea.  Keep in mind that regular soda, juice, and other mixers may contain a lot of sugar and must be counted as carbs. What are tips for following this plan?  Reading food labels  Start by checking the serving size on the "Nutrition Facts" label of packaged foods and drinks. The amount of calories, carbs, fats, and other nutrients listed on the label is based on one serving of the item. Many items contain more than one serving per package.  Check the total grams (g) of carbs in one serving. You can calculate the number of servings of carbs in one serving by dividing the total carbs by 15. For example, if a food has 30 g of total carbs, it would be equal to 2 servings of carbs.  Check the number of grams (g) of saturated and trans fats in one serving. Choose foods that have low or no amount of these fats.  Check the number of milligrams (mg) of salt (sodium) in one serving. Most people should limit total sodium intake to less than 2,300 mg per day.  Always check the nutrition information of foods labeled as "low-fat" or "nonfat". These foods may be higher in added sugar or refined carbs and should be avoided.  Talk to your dietitian to identify your daily goals for nutrients listed on the label. Shopping  Avoid buying canned, premade, or processed foods. These foods tend to be high in fat, sodium, and added sugar.  Shop around the outside edge of the grocery store.  This includes fresh fruits and vegetables, bulk grains, fresh meats, and fresh dairy. Cooking  Use low-heat cooking methods, such as baking, instead of high-heat cooking methods like deep frying.  Cook using healthy oils, such as olive, canola, or sunflower oil.  Avoid cooking with butter, cream, or high-fat meats. Meal planning  Eat meals and snacks regularly, preferably at the same times every day. Avoid going long periods of time without eating.  Eat foods high in fiber, such as fresh fruits, vegetables, beans, and whole grains. Talk to your dietitian about how many servings of carbs you can eat at each meal.  Eat 4-6 ounces (oz) of lean protein each day, such as lean meat, chicken, fish, eggs, or tofu. One oz of lean protein is equal to: ? 1 oz of meat, chicken, or fish. ? 1 egg. ?  cup of tofu.  Eat some foods each day that contain healthy fats, such as avocado, nuts, seeds, and fish. Lifestyle  Check your blood glucose regularly.  Exercise regularly as told by your health care provider. This may include: ? 150 minutes of moderate-intensity or vigorous-intensity exercise each week. This could be brisk walking, biking, or water aerobics. ? Stretching and doing strength exercises, such as yoga or weightlifting, at least 2 times a week.  Take medicines as told by your health care provider.  Do not use any products that contain nicotine or tobacco, such as cigarettes and e-cigarettes. If you need help quitting, ask your health care provider.  Work with a Veterinary surgeon or diabetes educator to identify strategies to manage stress and any emotional and social challenges. Questions to ask a health care provider  Do I need to meet with a diabetes educator?  Do I need to meet with a dietitian?  What number can I call if I have questions?  When are the best times to check my blood glucose? Where to find more information:  American Diabetes Association: diabetes.org  Academy of  Nutrition and Dietetics: www.eatright.[employer] of Diabetes and Digestive and Kidney Diseases (NIH): CarFlippers.tn Summary  A healthy meal plan will help you control your blood glucose and maintain a healthy lifestyle.  Working with a diet and nutrition specialist (dietitian) can help you make a meal plan that is best for you.  Keep in mind that carbohydrates (carbs) and alcohol have immediate effects on your blood glucose levels. It is important to count carbs and to use alcohol carefully. This information is not intended to replace advice given to you by your health care provider. Make sure you discuss any questions you have with your health care provider. Document Revised: 09/08/2017 Document Reviewed: 10/31/2016 Elsevier Patient Education  2020 Elsevier Inc.  Diabetes Mellitus and Exercise Exercising regularly is important for your overall health, especially when you have diabetes (diabetes mellitus). Exercising is not only about losing weight. It has many other health benefits, such as  increasing muscle strength and bone density and reducing body fat and stress. This leads to improved fitness, flexibility, and endurance, all of which result in better overall health. Exercise has additional benefits for people with diabetes, including:  Reducing appetite.  Helping to lower and control blood glucose.  Lowering blood pressure.  Helping to control amounts of fatty substances (lipids) in the blood, such as cholesterol and triglycerides.  Helping the body to respond better to insulin (improving insulin sensitivity).  Reducing how much insulin the body needs.  Decreasing the risk for heart disease by: ? Lowering cholesterol and triglyceride levels. ? Increasing the levels of good cholesterol. ? Lowering blood glucose levels. What is my activity plan? Your health care provider or certified diabetes educator can help you make a plan for the type and frequency of  exercise (activity plan) that works for you. Make sure that you:  Do at least 150 minutes of moderate-intensity or vigorous-intensity exercise each week. This could be brisk walking, biking, or water aerobics. ? Do stretching and strength exercises, such as yoga or weightlifting, at least 2 times a week. ? Spread out your activity over at least 3 days of the week.  Get some form of physical activity every day. ? Do not go more than 2 days in a row without some kind of physical activity. ? Avoid being inactive for more than 30 minutes at a time. Take frequent breaks to walk or stretch.  Choose a type of exercise or activity that you enjoy, and set realistic goals.  Start slowly, and gradually increase the intensity of your exercise over time. What do I need to know about managing my diabetes?   Check your blood glucose before and after exercising. ? If your blood glucose is 240 mg/dL (09.8 mmol/L) or higher before you exercise, check your urine for ketones. If you have ketones in your urine, do not exercise until your blood glucose returns to normal. ? If your blood glucose is 100 mg/dL (5.6 mmol/L) or lower, eat a snack containing 15-20 grams of carbohydrate. Check your blood glucose 15 minutes after the snack to make sure that your level is above 100 mg/dL (5.6 mmol/L) before you start your exercise.  Know the symptoms of low blood glucose (hypoglycemia) and how to treat it. Your risk for hypoglycemia increases during and after exercise. Common symptoms of hypoglycemia can include: ? Hunger. ? Anxiety. ? Sweating and feeling clammy. ? Confusion. ? Dizziness or feeling light-headed. ? Increased heart rate or palpitations. ? Blurry vision. ? Tingling or numbness around the mouth, lips, or tongue. ? Tremors or shakes. ? Irritability.  Keep a rapid-acting carbohydrate snack available before, during, and after exercise to help prevent or treat hypoglycemia.  Avoid injecting insulin into  areas of the body that are going to be exercised. For example, avoid injecting insulin into: ? The arms, when playing tennis. ? The legs, when jogging.  Keep records of your exercise habits. Doing this can help you and your health care provider adjust your diabetes management plan as needed. Write down: ? Food that you eat before and after you exercise. ? Blood glucose levels before and after you exercise. ? The type and amount of exercise you have done. ? When your insulin is expected to peak, if you use insulin. Avoid exercising at times when your insulin is peaking.  When you start a new exercise or activity, work with your health care provider to make sure the activity is safe for  you, and to adjust your insulin, medicines, or food intake as needed.  Drink plenty of water while you exercise to prevent dehydration or heat stroke. Drink enough fluid to keep your urine clear or pale yellow. Summary  Exercising regularly is important for your overall health, especially when you have diabetes (diabetes mellitus).  Exercising has many health benefits, such as increasing muscle strength and bone density and reducing body fat and stress.  Your health care provider or certified diabetes educator can help you make a plan for the type and frequency of exercise (activity plan) that works for you.  When you start a new exercise or activity, work with your health care provider to make sure the activity is safe for you, and to adjust your insulin, medicines, or food intake as needed. This information is not intended to replace advice given to you by your health care provider. Make sure you discuss any questions you have with your health care provider. Document Revised: 04/20/2017 Document Reviewed: 03/07/2016 Elsevier Patient Education  2020 [employer].

## 2020-06-23 NOTE — Patient Instructions (Addendum)
Health Maintenance, Female Adopting a healthy lifestyle and getting preventive care are important in promoting health and wellness. Ask your health care provider about:  The right schedule for you to have regular tests and exams.  Things you can do on your own to prevent diseases and keep yourself healthy. What should I know about diet, weight, and exercise? Eat a healthy diet   Eat a diet that includes plenty of vegetables, fruits, low-fat dairy products, and lean protein.  Do not eat a lot of foods that are high in solid fats, added sugars, or sodium. Maintain a healthy weight Body mass index (BMI) is used to identify weight problems. It estimates body fat based on height and weight. Your health care provider can help determine your BMI and help you achieve or maintain a healthy weight. Get regular exercise Get regular exercise. This is one of the most important things you can do for your health. Most adults should:  Exercise for at least 150 minutes each week. The exercise should increase your heart rate and make you sweat (moderate-intensity exercise).  Do strengthening exercises at least twice a week. This is in addition to the moderate-intensity exercise.  Spend less time sitting. Even light physical activity can be beneficial. Watch cholesterol and blood lipids Have your blood tested for lipids and cholesterol at 62 years of age, then have this test every 5 years. Have your cholesterol levels checked more often if:  Your lipid or cholesterol levels are high.  You are older than 62 years of age.  You are at high risk for heart disease. What should I know about cancer screening? Depending on your health history and family history, you may need to have cancer screening at various ages. This may include screening for:  Breast cancer.  Cervical cancer.  Colorectal cancer.  Skin cancer.  Lung cancer. What should I know about heart disease, diabetes, and high blood  pressure? Blood pressure and heart disease  High blood pressure causes heart disease and increases the risk of stroke. This is more likely to develop in people who have high blood pressure readings, are of African descent, or are overweight.  Have your blood pressure checked: ? Every 3-5 years if you are 18-39 years of age. ? Every year if you are 40 years old or older. Diabetes Have regular diabetes screenings. This checks your fasting blood sugar level. Have the screening done:  Once every three years after age 40 if you are at a normal weight and have a low risk for diabetes.  More often and at a younger age if you are overweight or have a high risk for diabetes. What should I know about preventing infection? Hepatitis B If you have a higher risk for hepatitis B, you should be screened for this virus. Talk with your health care provider to find out if you are at risk for hepatitis B infection. Hepatitis C Testing is recommended for:  Everyone born from 1945 through 1965.  Anyone with known risk factors for hepatitis C. Sexually transmitted infections (STIs)  Get screened for STIs, including gonorrhea and chlamydia, if: ? You are sexually active and are younger than 62 years of age. ? You are older than 62 years of age and your health care provider tells you that you are at risk for this type of infection. ? Your sexual activity has changed since you were last screened, and you are at increased risk for chlamydia or gonorrhea. Ask your health care provider if   you are at risk.  Ask your health care provider about whether you are at high risk for HIV. Your health care provider may recommend a prescription medicine to help prevent HIV infection. If you choose to take medicine to prevent HIV, you should first get tested for HIV. You should then be tested every 3 months for as long as you are taking the medicine. Pregnancy  If you are about to stop having your period (premenopausal) and  you may become pregnant, seek counseling before you get pregnant.  Take 400 to 800 micrograms (mcg) of folic acid every day if you become pregnant.  Ask for birth control (contraception) if you want to prevent pregnancy. Osteoporosis and menopause Osteoporosis is a disease in which the bones lose minerals and strength with aging. This can result in bone fractures. If you are 59 years old or older, or if you are at risk for osteoporosis and fractures, ask your health care provider if you should:  Be screened for bone loss.  Take a calcium or vitamin D supplement to lower your risk of fractures.  Be given hormone replacement therapy (HRT) to treat symptoms of menopause. Follow these instructions at home: Lifestyle  Do not use any products that contain nicotine or tobacco, such as cigarettes, e-cigarettes, and chewing tobacco. If you need help quitting, ask your health care provider.  Do not use street drugs.  Do not share needles.  Ask your health care provider for help if you need support or information about quitting drugs. Alcohol use  Do not drink alcohol if: ? Your health care provider tells you not to drink. ? You are pregnant, may be pregnant, or are planning to become pregnant.  If you drink alcohol: ? Limit how much you use to 0-1 drink a day. ? Limit intake if you are breastfeeding.  Be aware of how much alcohol is in your drink. In the [place] one drink equals one 12 oz bottle of beer (355 mL), one 5 oz glass of wine (148 mL), or one 1 oz glass of hard liquor (44 mL). General instructions  Schedule regular health, dental, and eye exams.  Stay current with your vaccines.  Tell your health care provider if: ? You often feel depressed. ? You have ever been abused or do not feel safe at home. Summary  Adopting a healthy lifestyle and getting preventive care are important in promoting health and wellness.  Follow your health care provider's instructions about healthy  diet, exercising, and getting tested or screened for diseases.  Follow your health care provider's instructions on monitoring your cholesterol and blood pressure. This information is not intended to replace advice given to you by your health care provider. Make sure you discuss any questions you have with your health care provider. Document Revised: 09/19/2018 Document Reviewed: 09/19/2018 Elsevier Patient Education  [employer].   Call Dr Melony [facility] [phone]

## 2020-06-24 LAB — CMP14+EGFR
ALT: 8 IU/L (ref 0–32)
AST: 10 IU/L (ref 0–40)
Albumin/Globulin Ratio: 1.5 (ref 1.2–2.2)
Albumin: 4.4 g/dL (ref 3.8–4.8)
Alkaline Phosphatase: 159 IU/L — ABNORMAL HIGH (ref 44–121)
BUN/Creatinine Ratio: 11 — ABNORMAL LOW (ref 12–28)
BUN: 14 mg/dL (ref 8–27)
Bilirubin Total: <0.2 mg/dL (ref 0.0–1.2)
CO2: 19 mmol/L — ABNORMAL LOW (ref 20–29)
Calcium: 9.4 mg/dL (ref 8.7–10.3)
Chloride: 105 mmol/L (ref 96–106)
Creatinine, Ser: 1.3 mg/dL — ABNORMAL HIGH (ref 0.57–1.00)
GFR calc Af Amer: 51 mL/min/{1.73_m2} — ABNORMAL LOW (ref >59)
GFR calc non Af Amer: 44 mL/min/{1.73_m2} — ABNORMAL LOW (ref >59)
Globulin, Total: 2.9 g/dL (ref 1.5–4.5)
Glucose: 177 mg/dL — ABNORMAL HIGH (ref 65–99)
Potassium: 4.7 mmol/L (ref 3.5–5.2)
Sodium: 139 mmol/L (ref 134–144)
Total Protein: 7.3 g/dL (ref 6.0–8.5)

## 2020-06-24 LAB — CBC
Hematocrit: 31.2 % — ABNORMAL LOW (ref 34.0–46.6)
Hemoglobin: 9.9 g/dL — ABNORMAL LOW (ref 11.1–15.9)
MCH: 23.9 pg — ABNORMAL LOW (ref 26.6–33.0)
MCHC: 31.7 g/dL (ref 31.5–35.7)
MCV: 75 fL — ABNORMAL LOW (ref 79–97)
Platelets: 217 10*3/uL (ref 150–450)
RBC: 4.15 x10E6/uL (ref 3.77–5.28)
RDW: 14.9 % (ref 11.7–15.4)
WBC: 5.6 10*3/uL (ref 3.4–10.8)

## 2020-06-24 LAB — HEMOGLOBIN A1C
Est. average glucose Bld gHb Est-mCnc: 220 mg/dL
Hgb A1c MFr Bld: 9.3 % — ABNORMAL HIGH (ref 4.8–5.6)

## 2020-06-24 LAB — LIPID PANEL
Chol/HDL Ratio: 3.3 ratio (ref 0.0–4.4)
Cholesterol, Total: 154 mg/dL (ref 100–199)
HDL: 47 mg/dL (ref >39)
LDL Chol Calc (NIH): 95 mg/dL (ref 0–99)
Triglycerides: 61 mg/dL (ref 0–149)
VLDL Cholesterol Cal: 12 mg/dL (ref 5–40)

## 2020-06-24 LAB — TSH: TSH: 2.36 u[IU]/mL (ref 0.450–4.500)

## 2020-06-25 LAB — NOVEL CORONAVIRUS, NAA: SARS-CoV-2, NAA: NOT DETECTED

## 2020-06-25 LAB — SARS-COV-2, NAA 2 DAY TAT

## 2020-06-29 ENCOUNTER — Telehealth: Payer: Medicaid Other

## 2020-06-29 ENCOUNTER — Telehealth: Payer: Self-pay

## 2020-06-29 NOTE — Telephone Encounter (Cosign Needed)
  Chronic Care Management   Outreach Note  06/29/2020 Name: [name] MRN: [id number] DOB: 04/15/1956  Referred by: Arnette Felts, FNP Reason for referral : Care Coordination ([facility] RN CM Call )   An unsuccessful telephone outreach was attempted today. The patient was referred to the case management team for assistance with care management and care coordination.   Follow Up Plan: A HIPAA compliant phone message was left for the patient providing contact information and requesting a return call.  Telephone follow up appointment with care management team member scheduled for: 07/21/20  [name], RN, BSN, CCM Care Management Coordinator [facility]  Direct Phone: [phone]

## 2020-07-02 ENCOUNTER — Other Ambulatory Visit: Payer: Self-pay | Admitting: Nurse Practitioner

## 2020-07-02 DIAGNOSIS — I10 Essential (primary) hypertension: Secondary | ICD-10-CM

## 2020-07-10 DIAGNOSIS — R12 Heartburn: Secondary | ICD-10-CM | POA: Insufficient documentation

## 2020-07-19 ENCOUNTER — Other Ambulatory Visit: Payer: Self-pay | Admitting: Nurse Practitioner

## 2020-07-19 DIAGNOSIS — I10 Essential (primary) hypertension: Secondary | ICD-10-CM

## 2020-07-21 ENCOUNTER — Telehealth: Payer: Medicaid Other

## 2020-07-24 ENCOUNTER — Telehealth: Payer: Self-pay

## 2020-07-24 ENCOUNTER — Telehealth: Payer: Medicaid Other

## 2020-07-24 NOTE — Telephone Encounter (Signed)
  Chronic Care Management   Outreach Note  07/24/2020 Name: Kara Kelley MRN: [id number] DOB: 22-Sep-1956  Referred by: Arnette Felts, FNP Reason for referral : Care Coordination   An unsuccessful telephone outreach was attempted today. The patient was referred to the case management team for assistance with care management and care coordination.   Follow Up Plan: The care management team will reach out to the patient again over the next 21 days.   Bevelyn Ngo, BSW, CDP Social Worker, Certified Dementia Practitioner [facility] Care Management [phone]

## 2020-07-31 ENCOUNTER — Other Ambulatory Visit: Payer: Self-pay | Admitting: Nurse Practitioner

## 2020-07-31 MED ORDER — OZEMPIC (1 MG/DOSE) 4 MG/3ML ~~LOC~~ SOPN: 1.0000 mg | PEN_INJECTOR | SUBCUTANEOUS | 1 refills | Status: DC

## 2020-07-31 NOTE — Progress Notes (Signed)
I am increasing her to 1 mg, she can use what she has left of the 0.5 mg then start the 1 mg weekly. I have sent to the pharmacy  Kindest Regards,   Arnette Felts, DNP, FNP-BC

## 2020-07-31 NOTE — Progress Notes (Signed)
Clarify if she has been taking the Ozempic and if so what dose?

## 2020-08-06 ENCOUNTER — Telehealth: Payer: Self-pay

## 2020-08-06 ENCOUNTER — Telehealth: Payer: Medicaid Other

## 2020-08-06 NOTE — Telephone Encounter (Signed)
°  Chronic Care Management   Outreach Note  08/06/2020 Name: CECELY RENGEL MRN: [id number] DOB: 1956-06-13  Referred by: Arnette Felts, FNP Reason for referral : Care Coordination   A second unsuccessful telephone outreach was attempted today. The patient was referred to the case management team for assistance with care management and care coordination.   Follow Up Plan: The care management team will reach out to the patient again over the next 21 days.   Bevelyn Ngo, BSW, CDP Social Worker, Certified Dementia Practitioner [facility] Care Management [phone]

## 2020-08-14 ENCOUNTER — Other Ambulatory Visit: Payer: Self-pay | Admitting: Nurse Practitioner

## 2020-08-14 DIAGNOSIS — K219 Gastro-esophageal reflux disease without esophagitis: Secondary | ICD-10-CM

## 2020-08-19 ENCOUNTER — Telehealth: Payer: Medicaid Other

## 2020-08-19 ENCOUNTER — Telehealth: Payer: Self-pay

## 2020-08-19 NOTE — Telephone Encounter (Cosign Needed Addendum)
  Care Management   Outreach Note  08/19/2020 Name: Kara Kelley MRN: [id number] DOB: [birth year]  Referred by: Arnette Felts, FNP Reason for referral : Chronic Care Management (CCM RNCM FU Call )   A second unsuccessful telephone outreach was attempted today. The patient was referred to the case management team for assistance with care management and care coordination. I spoke with [name] briefly today, unfortunately she stated she is not available to speak with me at this time but is agreeable to a call back at a later time.   Follow Up Plan: Telephone follow up appointment with care management team member scheduled for: 08/26/20  [name], RN, BSN, CCM Care Management Coordinator [facility] Care Management/[facility]  Direct Phone: [phone]

## 2020-08-25 ENCOUNTER — Ambulatory Visit: Payer: Medicaid Other

## 2020-08-25 DIAGNOSIS — N1832 Chronic kidney disease, stage 3b: Secondary | ICD-10-CM

## 2020-08-25 DIAGNOSIS — I10 Essential (primary) hypertension: Secondary | ICD-10-CM

## 2020-08-25 DIAGNOSIS — E1122 Type 2 diabetes mellitus with diabetic chronic kidney disease: Secondary | ICD-10-CM

## 2020-08-25 NOTE — Chronic Care Management (AMB) (Signed)
  Care Management   Follow Up Note   08/25/2020 Name: MILLICENT BLAZEJEWSKI MRN: [id number] DOB: 20-Nov-1955  Loma Sousa is enrolled in a Managed Medicaid plan: No. Outreach attempt today was successful.    Referred by: Arnette Felts, FNP Reason for referral : Care Coordination   PATRA GHERARDI is a 64 y.o. year old female who is a primary care patient of Arnette Felts, FNP. The care management team was consulted for assistance with care management and care coordination needs.    Review of patient status, including review of consultants reports, relevant laboratory and other test results, and collaboration with appropriate care team members and the patient's provider was performed as part of comprehensive patient evaluation and provision of chronic care management services.    SW placed a successful outbound call to the patient to assess for care coordination needs. The patient denies current SW needs but does report wanting to be seen this week by her provider for ongoing stomach pain. SW advised the patient to contact the office to scheduled an appointment.  The care management team will reach out to the patient again over the next 60 days.   Bevelyn Ngo, BSW, CDP Social Worker, Certified Dementia Practitioner [facility] Care Management [phone]

## 2020-08-26 ENCOUNTER — Telehealth: Payer: Medicaid Other

## 2020-08-27 ENCOUNTER — Ambulatory Visit: Payer: Medicaid Other | Admitting: Nurse Practitioner

## 2020-08-27 ENCOUNTER — Encounter: Payer: Self-pay | Admitting: Nurse Practitioner

## 2020-08-27 ENCOUNTER — Other Ambulatory Visit: Payer: Self-pay

## 2020-08-27 VITALS — BP 142/80 | HR 68 | Temp 98.3°F | Ht 60.6 in | Wt 209.0 lb

## 2020-08-27 DIAGNOSIS — R109 Unspecified abdominal pain: Secondary | ICD-10-CM | POA: Diagnosis not present

## 2020-08-27 NOTE — Progress Notes (Signed)
I,Yamilka Roman [employer] as a Education administrator for [employer], FNP.,have documented all relevant documentation on the behalf of [name], FNP,as directed by  [name], FNP while in the presence of [name], [place]. This visit occurred during the SARS-CoV-2 public health emergency.  Safety protocols were in place, including screening questions prior to the visit, additional usage of staff PPE, and extensive cleaning of exam room while observing appropriate contact time as indicated for disinfecting solutions.  Subjective:     Patient ID: Kara Kelley , female    DOB: 1955-10-27 , 64 y.o.   MRN: [id number]   Chief Complaint  Patient presents with  . Abdominal Pain    patient stated her stomach has been hurting for the past week. she stated she feels nauseous every morning.     HPI  Patient stated she has been having stomach pain for the past week.  Wt Readings from Last 3 Encounters: 08/27/20 : 209 lb (94.8 kg) 06/23/20 : 212 lb 3.2 oz (96.3 kg) 04/03/20 : 210 lb (95.3 kg) Abdominal Pain This is a new problem. The current episode started in the past 7 days. The onset quality is sudden. The problem occurs intermittently. The problem has been unchanged. The pain is located in the generalized abdominal region. The pain is at a severity of 7/10. The pain is moderate. The quality of the pain is burning. The abdominal pain does not radiate. Associated symptoms include nausea. Pertinent negatives include no constipation, diarrhea or vomiting. Associated symptoms comments: Last bowel movement this morning - normal. The pain is relieved by movement. She has tried nothing for the symptoms. Prior workup: Dr Shirley Muscat. There is no history of abdominal surgery or colon cancer.     Past Medical History:  Diagnosis Date  . Anemia   . Anxiety   . Arthritis   . Asthma   . Bunion   . Callus   . Chronic pain   . Cocaine abuse ([place])   . COPD (chronic obstructive pulmonary disease) ([place])   .  Corns and callosities   . Degenerative joint disease   . Depression   . Diabetes mellitus   . Endometrial polyp   . ETOH abuse   . Gall stones   . GERD (gastroesophageal reflux disease)   . Headache    history of Migraines  . Hepatitis C    Hep C  . Hyperlipidemia   . Hypertension   . Insomnia   . Spondylolisthesis of lumbar region   . Substance abuse ([place])    alcoholism  . Tuberculosis 1985  . Wears dentures   . Wears glasses      Family History  Problem Relation Age of Onset  . Heart disease Mother   . Diabetes Other        mat great aunt  . Cirrhosis Other        mat great aunt  . Cirrhosis Other        mat great uncles x 2  . Colon cancer Neg Hx   . Breast cancer Neg Hx      Current Outpatient Medications:  .  Accu-Chek FastClix Lancets MISC, USE TO CHECK BLOOD GLUCOSE THREE TIMES DAILY, Disp: 102 each, Rfl: prn .  ACCU-CHEK GUIDE test strip, USE TO TEST FOUR TIMES DAILY, Disp: 300 strip, Rfl: 2 .  albuterol (PROVENTIL HFA;VENTOLIN HFA) 108 (90 Base) MCG/ACT inhaler, Inhale 2 puffs into the lungs every 6 (six) hours as needed. For shortness of breath. (Patient  taking differently: Inhale 2 puffs into the lungs every 6 (six) hours as needed for wheezing or shortness of breath. ), Disp: 18 g, Rfl: 1 .  ARIPiprazole (ABILIFY) 10 MG tablet, Take 10 mg by mouth daily. , Disp: , Rfl:  .  BD PEN NEEDLE NANO 2ND GEN 32G X 4 MM MISC, USE THREE TIMES DAILY AS DIRECTED, Disp: 200 each, Rfl: 3 .  Blood Glucose Monitoring Suppl (ACCU-CHEK NANO SMARTVIEW) w/Device KIT, 1 each by Does not apply route 3 (three) times daily., Disp: 1 kit, Rfl: 0 .  buPROPion (WELLBUTRIN XL) 300 MG 24 hr tablet, Take 300 mg by mouth daily. , Disp: , Rfl: 0 .  Cholecalciferol (VITAMIN D) 50 MCG (2000 UT) CAPS, Take 2,000 Units by mouth daily., Disp: , Rfl:  .  cyclobenzaprine (FLEXERIL) 5 MG tablet, Take 1 tablet (5 mg total) by mouth 3 (three) times daily as needed for muscle spasms., Disp: 30 tablet,  Rfl: 1 .  EVENING PRIMROSE OIL PO, Take 1,000 mg by mouth daily., Disp: , Rfl:  .  gabapentin (NEURONTIN) 300 MG capsule, Take 300 mg by mouth 3 (three) times daily as needed (pain)., Disp: , Rfl:  .  insulin detemir (LEVEMIR FLEXTOUCH) 100 UNIT/ML FlexPen, Inject 30 Units into the skin at bedtime., Disp: 15 pen, Rfl: 1 .  LINZESS 145 MCG CAPS capsule, Take 145 mcg by mouth daily before breakfast. , Disp: , Rfl:  .  lisinopril (ZESTRIL) 10 MG tablet, TAKE 1 TABLET(10 MG) BY MOUTH DAILY, Disp: 90 tablet, Rfl: 0 .  NOVOLOG FLEXPEN 100 UNIT/ML FlexPen, Inject 18 Units into the skin 3 (three) times daily with meals., Disp: 15 pen, Rfl: 1 .  Omega 3 1200 MG CAPS, Take 1,200 mg by mouth daily., Disp: , Rfl:  .  omeprazole (PRILOSEC) 40 MG capsule, TAKE 1 CAPSULE(40 MG) BY MOUTH DAILY BEFORE BREAKFAST, Disp: 90 capsule, Rfl: 0 .  oxyCODONE-acetaminophen (PERCOCET/ROXICET) 5-325 MG tablet, Take 1 tablet by mouth every 4 (four) hours as needed for severe pain., Disp: , Rfl:  .  Semaglutide, 1 MG/DOSE, (OZEMPIC, 1 MG/DOSE,) 4 MG/3ML SOPN, Inject 1 mg into the skin once a week., Disp: 9 mL, Rfl: 1 .  spironolactone (ALDACTONE) 50 MG tablet, TAKE 1 TABLET(50 MG) BY MOUTH DAILY, Disp: 90 tablet, Rfl: 0 .  traZODone (DESYREL) 150 MG tablet, Take 150 mg by mouth at bedtime. , Disp: , Rfl:  .  vitamin E 180 MG (400 UNITS) capsule, Take 400 Units by mouth daily., Disp: , Rfl:  .  diclofenac Sodium (VOLTAREN) 1 % GEL, Apply 2 g topically 2 (two) times daily as needed (pain). (Patient not taking: Reported on 06/23/2020), Disp: , Rfl:  .  lidocaine (LIDODERM) 5 %, Place 1 patch onto the skin daily. Remove & Discard patch within 12 hours or as directed by MD (Patient not taking: Reported on 06/23/2020), Disp: 30 patch, Rfl: 0   No Known Allergies   Review of Systems  Constitutional: Negative.   HENT: Negative.   Eyes: Negative.   Respiratory: Negative.   Cardiovascular: Negative.   Gastrointestinal: Positive for  abdominal pain and nausea. Negative for constipation, diarrhea and vomiting.  Endocrine: Negative.   Genitourinary: Negative.   Musculoskeletal: Negative.   Skin: Negative.   Neurological: Negative.   Hematological: Negative.   Psychiatric/Behavioral: Negative.      Today's Vitals   08/27/20 0938  BP: (!) 142/80  Pulse: 68  Temp: 98.3 F (36.8 C)  TempSrc: Oral  Weight: 209 lb (94.8 kg)  Height: 5' 0.6" (1.539 m)  PainSc: 8    Body mass index is 40.01 kg/m.   Objective:  Physical Exam Constitutional:      General: She is not in acute distress.    Appearance: She is well-developed. She is obese.  Abdominal:     General: Bowel sounds are normal.     Palpations: Abdomen is soft. There is mass (mid abdomen with firm area ). There is no shifting dullness or hepatomegaly.     Tenderness: There is abdominal tenderness (mid abdomen above navel ).  Skin:    General: Skin is warm and dry.  Neurological:     Mental Status: She is alert.         Assessment And Plan:     1. Pain in abdomen on palpation  Tender abdomen to mid abdomen and felt firm area above navel  Will check abdomen ultrasound  She is encouraged if has vomiting or symptoms worsen to go to the ER for further evaluation - US Abdomen Complete; Future     Patient was given opportunity to ask questions. Patient verbalized understanding of the plan and was able to repeat key elements of the plan. All questions were answered to their satisfaction.    Teola Bradley, FNP, have reviewed all documentation for this visit. The documentation on 08/28/20 for the exam, diagnosis, procedures, and orders are all accurate and complete.  THE PATIENT IS ENCOURAGED TO PRACTICE SOCIAL DISTANCING DUE TO THE COVID-19 PANDEMIC.

## 2020-08-31 ENCOUNTER — Telehealth: Payer: Medicaid Other

## 2020-08-31 ENCOUNTER — Telehealth: Payer: Self-pay

## 2020-08-31 NOTE — Telephone Encounter (Cosign Needed)
  Chronic Care Management   Outreach Note  08/31/2020 Name: Kara Kelley MRN: [id number] DOB: 12-08-1955  Referred by: Arnette Felts, FNP Reason for referral : Chronic Care Management (#3 RNCM FU Call )   Third unsuccessful telephone outreach was attempted today. The patient was referred to the case management team for assistance with care management and care coordination. The patient's primary care provider has been notified of our unsuccessful attempts to make or maintain contact with the patient. The care management team is pleased to engage with this patient at any time in the future should he/she be interested in assistance from the care management team.   Follow Up Plan: We have been unable to make contact with the patient for follow up. The care management team is available to follow up with the patient after provider conversation with the patient regarding recommendation for care management engagement and subsequent re-referral to the care management team.   [name], RN, BSN, CCM Care Management Coordinator [facility] Care Management/[facility]  Direct Phone: [phone]

## 2020-09-10 ENCOUNTER — Other Ambulatory Visit: Payer: Self-pay

## 2020-09-10 ENCOUNTER — Ambulatory Visit: Payer: Medicaid Other | Admitting: Podiatry

## 2020-09-10 DIAGNOSIS — B351 Tinea unguium: Secondary | ICD-10-CM | POA: Diagnosis not present

## 2020-09-10 DIAGNOSIS — M2041 Other hammer toe(s) (acquired), right foot: Secondary | ICD-10-CM

## 2020-09-10 DIAGNOSIS — M62838 Other muscle spasm: Secondary | ICD-10-CM

## 2020-09-10 DIAGNOSIS — E1165 Type 2 diabetes mellitus with hyperglycemia: Secondary | ICD-10-CM | POA: Diagnosis not present

## 2020-09-10 DIAGNOSIS — M79674 Pain in right toe(s): Secondary | ICD-10-CM

## 2020-09-10 DIAGNOSIS — M79675 Pain in left toe(s): Secondary | ICD-10-CM

## 2020-09-10 DIAGNOSIS — M2042 Other hammer toe(s) (acquired), left foot: Secondary | ICD-10-CM

## 2020-09-10 DIAGNOSIS — R519 Headache, unspecified: Secondary | ICD-10-CM

## 2020-09-10 MED ORDER — CYCLOBENZAPRINE HCL 5 MG PO TABS: 5.0000 mg | ORAL_TABLET | Freq: Three times a day (TID) | ORAL | 1 refills | Status: DC | PRN

## 2020-09-15 ENCOUNTER — Other Ambulatory Visit: Payer: Self-pay | Admitting: Nurse Practitioner

## 2020-09-15 ENCOUNTER — Ambulatory Visit
Admission: RE | Admit: 2020-09-15 | Discharge: 2020-09-15 | Disposition: A | Discharge status: Home / Self Care | Payer: Medicaid Other | Source: Ambulatory Visit | Attending: Nurse Practitioner | Admitting: Nurse Practitioner

## 2020-09-15 DIAGNOSIS — R109 Unspecified abdominal pain: Secondary | ICD-10-CM

## 2020-09-15 DIAGNOSIS — R9389 Abnormal findings on diagnostic imaging of other specified body structures: Secondary | ICD-10-CM

## 2020-09-15 NOTE — Progress Notes (Signed)
Received results of her abdomen ultrasound that reveals a possible lesion to the supraumbilical area, recommend to order a CT scan with contrast. Order has been placed and made referral specialist aware. I called patient to inform of results and plan. Verbalizes understanding

## 2020-09-15 NOTE — Progress Notes (Signed)
Subjective: 64 year old female presents the office today for concerns of thick, elongated toenails which she cannot trim her self.  She gets an occasional stinging sensation in the left second toe.  She denies any recent injury or trauma denies any increase in swelling or redness to her feet.  Denies any ulcerations.  Her last A1c is increased to 9.3 on 06/23/2020. Denies any systemic complaints such as fevers, chills, nausea, vomiting. No acute changes since last appointment, and no other complaints at this time.   Objective: AAO x3, NAD DP/PT pulses palpable bilaterally, CRT less than 3 seconds Nails are hypertrophic, dystrophic, brittle, discolored, elongated 10. No surrounding redness or drainage. Tenderness nails 1-5 bilaterally. No open lesions or pre-ulcerative lesions are identified today. Hammertoes are present.  There is occasional stinging, discomfort the second toe.  There is no significant pain today and there is no open lesions or edema.  I think her symptoms are coming from the hammertoe, digital deformity. No pain with calf compression, swelling, warmth, erythema  Assessment: Symptomatic onychomycosis, hammertoe deformity; uncontrolled diabetes  Plan: -All treatment options discussed with the patient including all alternatives, risks, complications.  -Debrided the nails x10 without any complications or bleeding. -I dispensed various offloading pads for the second digit.  I think her symptoms are coming more from the contracture of the toe.  Discussed shoe modifications as well. -Glucose control. -Daily foot inspection. -Patient encouraged to call the office with any questions, concerns, change in symptoms.   Vivi Barrack DPM

## 2020-09-16 ENCOUNTER — Ambulatory Visit: Payer: Self-pay

## 2020-09-16 ENCOUNTER — Other Ambulatory Visit: Payer: Self-pay

## 2020-09-16 ENCOUNTER — Telehealth: Payer: Medicaid Other

## 2020-09-16 DIAGNOSIS — N1832 Chronic kidney disease, stage 3b: Secondary | ICD-10-CM

## 2020-09-16 DIAGNOSIS — E1122 Type 2 diabetes mellitus with diabetic chronic kidney disease: Secondary | ICD-10-CM

## 2020-09-16 DIAGNOSIS — L299 Pruritus, unspecified: Secondary | ICD-10-CM | POA: Insufficient documentation

## 2020-09-16 DIAGNOSIS — H6123 Impacted cerumen, bilateral: Secondary | ICD-10-CM | POA: Insufficient documentation

## 2020-09-16 DIAGNOSIS — I10 Essential (primary) hypertension: Secondary | ICD-10-CM

## 2020-09-16 NOTE — Chronic Care Management (AMB) (Signed)
Care Management   Follow Up Note   09/16/2020 Name: Kara Kelley MRN: [id number] DOB: [birth year]  Referred by: [name], FNP Reason for referral : Kara Kelley is a 64 y.o. year old female who is a primary care patient of [name], [place]. The CCM team was consulted for assistance with chronic disease management and care coordination needs.    Review of patient status, including review of consultants reports, relevant laboratory and other test results, and collaboration with appropriate care team members and the patient's provider was performed as part of comprehensive patient evaluation and provision of care management services.    We have been unable to make contact with the patient for follow up. The care management team is available to follow up with the patient after provider conversation with the patient regarding recommendation for care management engagement and subsequent re-referral to the care management team.   Outpatient Encounter Medications as of 09/16/2020  Medication Sig  . Accu-Chek FastClix Lancets MISC USE TO CHECK BLOOD GLUCOSE THREE TIMES DAILY  . ACCU-CHEK GUIDE test strip USE TO TEST FOUR TIMES DAILY  . albuterol (PROVENTIL HFA;VENTOLIN HFA) 108 (90 Base) MCG/ACT inhaler Inhale 2 puffs into the lungs every 6 (six) hours as needed. For shortness of breath. (Patient taking differently: Inhale 2 puffs into the lungs every 6 (six) hours as needed for wheezing or shortness of breath. )  . ARIPiprazole (ABILIFY) 10 MG tablet Take 10 mg by mouth daily.   . BD PEN NEEDLE NANO 2ND GEN 32G X 4 MM MISC USE THREE TIMES DAILY AS DIRECTED  . Blood Glucose Monitoring Suppl (ACCU-CHEK NANO SMARTVIEW) w/Device KIT 1 each by Does not apply route 3 (three) times daily.  [name] buPROPion (WELLBUTRIN XL) 300 MG 24 hr tablet Take 300 mg by mouth daily.   . Cholecalciferol (VITAMIN D) 50 MCG (2000 UT) CAPS Take 2,000 Units by mouth daily.  . cyclobenzaprine (FLEXERIL) 5 MG tablet  Take 1 tablet (5 mg total) by mouth 3 (three) times daily as needed for muscle spasms.  . diclofenac Sodium (VOLTAREN) 1 % GEL Apply 2 g topically 2 (two) times daily as needed (pain). (Patient not taking: Reported on 06/23/2020)  . EVENING PRIMROSE OIL PO Take 1,000 mg by mouth daily.  [name] gabapentin (NEURONTIN) 300 MG capsule Take 300 mg by mouth 3 (three) times daily as needed (pain).  . insulin detemir (LEVEMIR FLEXTOUCH) 100 UNIT/ML FlexPen Inject 30 Units into the skin at bedtime.  . lidocaine (LIDODERM) 5 % Place 1 patch onto the skin daily. Remove & Discard patch within 12 hours or as directed by MD (Patient not taking: Reported on 06/23/2020)  . LINZESS 145 MCG CAPS capsule Take 145 mcg by mouth daily before breakfast.   . lisinopril (ZESTRIL) 10 MG tablet TAKE 1 TABLET(10 MG) BY MOUTH DAILY  . NOVOLOG FLEXPEN 100 UNIT/ML FlexPen Inject 18 Units into the skin 3 (three) times daily with meals.  . Omega 3 1200 MG CAPS Take 1,200 mg by mouth daily.  [name] omeprazole (PRILOSEC) 40 MG capsule TAKE 1 CAPSULE(40 MG) BY MOUTH DAILY BEFORE BREAKFAST  . oxyCODONE-acetaminophen (PERCOCET/ROXICET) 5-325 MG tablet Take 1 tablet by mouth every 4 (four) hours as needed for severe pain.  . Semaglutide, 1 MG/DOSE, (OZEMPIC, 1 MG/DOSE,) 4 MG/3ML SOPN Inject 1 mg into the skin once a week.  . spironolactone (ALDACTONE) 50 MG tablet TAKE 1 TABLET(50 MG) BY MOUTH DAILY  . traZODone (DESYREL) 150 MG tablet Take 150 mg  by mouth at bedtime.   . vitamin E 180 MG (400 UNITS) capsule Take 400 Units by mouth daily.   No facility-administered encounter medications on file as of 09/16/2020.    Goals Addressed      Patient Stated   .  COMPLETED: "I have to have back surgery" (pt-stated)        CARE PLAN ENTRY (see longitudinal plan of care for additional care plan information)  Current Barriers:  [name] Knowledge Deficits related to treatment for degenerative spondylosis  . Chronic Disease Management support and education  needs related to Essential Hypertension, DMII, Degenerative Spondylosis, fall   Nurse Case Manager Clinical Goal(s):  [name] Over the next 30 days, patient will verbalize understanding of plan for treatment of chronic degenerative spondylosis with associated pain and recent fall   CCM RN CM Interventions:  09/16/20 call completed with patient  . Unable to reach patient for ongoing collaboration to assess for CCM needs, patient closed from CCM   Patient Self Care Activities:  . Self administers medications as prescribed . Attends all scheduled provider appointments . Calls pharmacy for medication refills . Calls provider office for new concerns or questions  Please see past updates related to this goal by clicking on the "Past Updates" button in the selected goal      .  COMPLETED: "I need to learn how to eat better" (pt-stated)        Current Barriers:  [name] Knowledge Deficits related to diabetes disease process and Self Health Mangement  . A1C 6.9  Nurse Case Manager Clinical Goal(s):  [name] Over the next 60 days, patient will work with the CCM team to address needs related to diabetes disease education and improved Self Health management  . Over the next 90 days, patient will lower her A1C <6.9  CCM RN CM Interventions:  03/19/20 call completed with patient  . Evaluation of current treatment plan related to diabetes and patient's adherence to plan as established by provider . Provided education to patient re: patient's most recent A1c has increased to 7.2 % obtained on 03/12/20; discussed target A1c <7.0; Education provided related to daily glycemic control, FBS 80-130, <180 after meals; Reiterated importance of following ADA dietary recommendations including Meal Planning with using the Plate Method and lowering intake of carbohydrates; discussed the importance of implementing a routine exercise regimen; 150 minutes per week; discussed importance of taking medications exactly as  prescribed . Reviewed medications with patient and discussed indication, dosage and frequency of her prescribed diabetic medications;  o Levemir 100u/ml, injecting 40 units at bedtime o Liraglutide (Victoza) 18 mg/3 ml, injecting 0.3 ml (1.8 mg daily) o Previous treatments; Metformin; Ozempic  . Patient admits she would benefit from working with DM Nutritionist and would like a referral placed  . Discussed sending embedded Pharm D referral to review current DM medication regimen and make appropriate recommendations . Sent in basket message to PCP provider [name], FNP requesting a Nutritionist referral, sent Pharm D referral  . Advised patient, providing education and rationale, to check cbg 1-2 times daily before meals and record, calling the CCM team and or PCP provider for findings outside established parameters . Discussed plans with patient for ongoing care management follow up and provided patient with direct contact information for care management team  09/16/20 Unable to reach patient for ongoing collaboration to assess for CCM needs, patient closed from CCM   Patient Self Care Activities:  . Self administers medications as prescribed . Attends  all scheduled provider appointments . Calls pharmacy for medication refills . Attends church or other social activities . Performs ADL's independently . Performs IADL's independently . Calls provider office for new concerns or questions  Please see past updates related to this goal by clicking on the "Past Updates" button in the selected goal      .  COMPLETED: "I need to see a kidney specialist" (pt-stated)        Current Barriers:  . Limited knowledge of how to access specialist . Unable to verbalize plan for stage III CKD Diagnosis  Clinical Social Work Clinical Goal(s):  [name] Over the next 30 days the patient will work with the CCM team to obtain a nephrology appointment.  CCM RN CM Interventions: 09/16/20 Unable to reach patient  for ongoing collaboration to assess for CCM needs, patient closed from CCM   Patient Self Care Activities:  . Attends all scheduled provider appointments . Calls pharmacy for medication refills . Performs ADL's independently  Please see past updates related to this goal by clicking on the "Past Updates" button in the selected goal      .  COMPLETED: "I would like to quit smoking" (pt-stated)        Current Barriers:  [name] Knowledge Deficits related to treatment management and resources related to how to quit smoking tobacco products  Nurse Case Manager Clinical Goal(s):  [name] Over the next 60 days, patient will verbalize understanding of plan for smoking cessation   CCM RN CM Interventions:  09/16/20 Unable to reach patient for ongoing collaboration to assess for CCM needs, patient closed from CCM   Patient Self Care Activities:  . Self administers medications as prescribed . Attends all scheduled provider appointments . Calls pharmacy for medication refills . Attends church or other social activities . Performs ADL's independently . Performs IADL's independently . Calls provider office for new concerns or questions  Initial goal documentation       Other   .  COMPLETED: Pharmacy Care Plan        CARE PLAN ENTRY (see longitudinal plan of care for additional care plan information)  Current Barriers:  . Chronic Disease Management support, education, and care coordination needs related to Diabetes   Diabetes Lab Results  Component Value Date/Time   HGBA1C 7.2 (H) 03/12/2020 11:40 AM   HGBA1C 7.1 (H) 12/19/2019 09:33 AM   . Pharmacist Clinical Goal(s): o Over the next 90 days, patient will work with PharmD and providers to achieve A1c goal <7% . Current regimen:  o Levemir 30 units at bedtime o Novolog 18 units three times daily with meals o Ozempic 0.$RemoveBef'5mg'[id number]$  weekly . Interventions: o Provided dietary and exercise recommendations o Provided Daily Diabetes Meal Planning Guide  while in office o Discussed appropriate goal fasting blood sugar (80-130) o Discussed appropriate goal for Hemoglobin A1c (less than 7%) o Provided patient education regarding the importance of eating when administering Novolog ("mealtime" insulin) o Determined patient is able to receive Ozempic from local pharmacy through insurance . Patient self care activities - Over the next 90 days, patient will: o Check blood sugar twice daily, document, and provide at future appointments o Contact provider with any episodes of hypoglycemia o Focus on eating consistent, well-balanced meals following the PLATE method (information provided) o Limit serving sizes of carbohydrates (rice, bread, pasta, potatoes, etc) o Limit sweets and sugary snacks o Start exercising for 15 minutes 2-3 times weekly with a goal of 30 minutes 5 times weekly  Medication management . Pharmacist Clinical Goal(s): o Over the next 180 days, patient will work with PharmD and providers to maintain optimal medication adherence . Current pharmacy: [facility] . Interventions o Comprehensive medication review performed. o Continue current medication management strategy o Discussed medication synchronization, adherence packaging and delivery available with [facility] . Patient self care activities - Over the next 180 days, patient will: o Focus on medication adherence by use of pill box o Take medications as prescribed o Report any questions or concerns to PharmD and/or provider(s) o Contact PharmD if interested in utilizing services provided by [facility] pharmacy  Initial goal documentation        Follow Up Plan:  No further follow up required  Barb Merino, RN, BSN, CCM Care Management Coordinator [place] [facility]  Direct Phone: [phone]

## 2020-09-23 ENCOUNTER — Ambulatory Visit (INDEPENDENT_AMBULATORY_CARE_PROVIDER_SITE_OTHER): Payer: Medicaid Other | Admitting: Nurse Practitioner

## 2020-09-23 ENCOUNTER — Encounter: Payer: Self-pay | Admitting: Nurse Practitioner

## 2020-09-23 ENCOUNTER — Other Ambulatory Visit: Payer: Self-pay

## 2020-09-23 VITALS — BP 136/72 | HR 79 | Temp 98.2°F | Ht 60.6 in | Wt 208.0 lb

## 2020-09-23 DIAGNOSIS — I10 Essential (primary) hypertension: Secondary | ICD-10-CM | POA: Diagnosis not present

## 2020-09-23 DIAGNOSIS — E1165 Type 2 diabetes mellitus with hyperglycemia: Secondary | ICD-10-CM

## 2020-09-23 NOTE — Progress Notes (Signed)
Rutherford Nail as a scribe for [name], FNP.,have documented all relevant documentation on the behalf of [name], FNP,as directed by  [name], FNP while in the presence of [name], [place]. This visit occurred during the SARS-CoV-2 public health emergency.  Safety protocols were in place, including screening questions prior to the visit, additional usage of staff PPE, and extensive cleaning of exam room while observing appropriate contact time as indicated for disinfecting solutions.  Subjective:     Patient ID: Kara Kelley , female    DOB: 04-18-1956 , 64 y.o.   MRN: [id number]   Chief Complaint  Patient presents with  . Diabetes    HPI  Pt here today for DM/bp check.   She is scheduled for 12/21 for a CT scan of her abdomen her ultrasound shows focal intraperitoneal supraumbilical shadowing mass lesion and question interval calcification. 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 She has type 2 diabetes mellitus. There are no hypoglycemic associated symptoms. Pertinent negatives for hypoglycemia include no dizziness. There are no diabetic associated symptoms. Pertinent negatives for diabetes include no chest pain, no fatigue, no polydipsia, no polyphagia and no polyuria. There are no hypoglycemic complications. There are no diabetic complications. Risk factors for coronary artery disease include sedentary lifestyle and obesity. Current diabetic treatment includes oral agent (dual therapy). She has not had a previous visit with a dietitian. (This morning her blood sugar was 131 last night was 113) An ACE inhibitor/angiotensin II receptor blocker is being taken. She does not see a  podiatrist.Eye exam is current.     Past Medical History:  Diagnosis Date  . Anemia   . Anxiety   . Arthritis   . Asthma   . Bunion   . Callus   . Chronic pain   . Cocaine abuse ([place])   . COPD (chronic obstructive pulmonary disease) ([place])   . Corns and callosities   . Degenerative joint disease   . Depression   . Diabetes mellitus   . Endometrial polyp   . ETOH abuse   . Gall stones   . GERD (gastroesophageal reflux disease)   . Headache    history of Migraines  . Hepatitis C    Hep C  . Hyperlipidemia   . Hypertension   . Insomnia   . Spondylolisthesis of lumbar region   . Substance abuse ([place])    alcoholism  . Tuberculosis 1985  . Wears dentures   . Wears glasses      Family History  Problem Relation Age of Onset  . Heart disease Mother   . Diabetes Other        mat great aunt  . Cirrhosis Other        mat great aunt  . Cirrhosis Other        mat great uncles x 2  . Colon cancer Neg Hx   . Breast cancer Neg Hx      Current Outpatient Medications:  .  Accu-Chek FastClix Lancets MISC, USE TO CHECK BLOOD GLUCOSE THREE TIMES DAILY, Disp: 102 each, Rfl: prn .  ACCU-CHEK GUIDE test strip, USE TO TEST FOUR TIMES DAILY, Disp: 300 strip,  Rfl: 2 .  albuterol (PROVENTIL HFA;VENTOLIN HFA) 108 (90 Base) MCG/ACT inhaler, Inhale 2 puffs into the lungs every 6 (six) hours as needed. For shortness of breath. (Patient taking differently: Inhale 2 puffs into the lungs every 6 (six) hours as needed for wheezing or shortness of breath.), Disp: 18 g, Rfl: 1 .  ARIPiprazole (ABILIFY) 10 MG tablet, Take 10 mg by mouth daily. , Disp: , Rfl:  .  BD PEN NEEDLE NANO 2ND GEN 32G X 4 MM MISC, USE THREE TIMES DAILY AS DIRECTED, Disp: 200 each, Rfl: 3 .  Blood Glucose Monitoring Suppl (ACCU-CHEK NANO SMARTVIEW) w/Device KIT, 1 each by Does not apply route 3 (three) times daily., Disp: 1 kit, Rfl: 0 .  buPROPion (WELLBUTRIN XL) 300 MG 24 hr tablet, Take 300 mg by mouth daily. , Disp: ,  Rfl: 0 .  Cholecalciferol (VITAMIN D) 50 MCG (2000 UT) CAPS, Take 2,000 Units by mouth daily., Disp: , Rfl:  .  cyclobenzaprine (FLEXERIL) 5 MG tablet, Take 1 tablet (5 mg total) by mouth 3 (three) times daily as needed for muscle spasms., Disp: 30 tablet, Rfl: 1 .  gabapentin (NEURONTIN) 300 MG capsule, Take 300 mg by mouth 3 (three) times daily as needed (pain)., Disp: , Rfl:  .  insulin detemir (LEVEMIR FLEXTOUCH) 100 UNIT/ML FlexPen, Inject 30 Units into the skin at bedtime., Disp: 15 pen, Rfl: 1 .  LINZESS 145 MCG CAPS capsule, Take 145 mcg by mouth daily before breakfast. , Disp: , Rfl:  .  lisinopril (ZESTRIL) 10 MG tablet, TAKE 1 TABLET(10 MG) BY MOUTH DAILY, Disp: 90 tablet, Rfl: 0 .  NOVOLOG FLEXPEN 100 UNIT/ML FlexPen, Inject 18 Units into the skin 3 (three) times daily with meals., Disp: 15 pen, Rfl: 1 .  omeprazole (PRILOSEC) 40 MG capsule, TAKE 1 CAPSULE(40 MG) BY MOUTH DAILY BEFORE BREAKFAST, Disp: 90 capsule, Rfl: 0 .  oxyCODONE-acetaminophen (PERCOCET/ROXICET) 5-325 MG tablet, Take 1 tablet by mouth every 4 (four) hours as needed for severe pain., Disp: , Rfl:  .  Semaglutide, 1 MG/DOSE, (OZEMPIC, 1 MG/DOSE,) 4 MG/3ML SOPN, Inject 1 mg into the skin once a week., Disp: 9 mL, Rfl: 1 .  spironolactone (ALDACTONE) 50 MG tablet, TAKE 1 TABLET(50 MG) BY MOUTH DAILY, Disp: 90 tablet, Rfl: 0 .  traZODone (DESYREL) 150 MG tablet, Take 150 mg by mouth at bedtime. , Disp: , Rfl:  .  EVENING PRIMROSE OIL PO, Take 1,000 mg by mouth daily. (Patient not taking: Reported on 09/23/2020), Disp: , Rfl:  .  Omega 3 1200 MG CAPS, Take 1,200 mg by mouth daily. (Patient not taking: Reported on 09/23/2020), Disp: , Rfl:  .  vitamin E 180 MG (400 UNITS) capsule, Take 400 Units by mouth daily. (Patient not taking: Reported on 09/23/2020), Disp: , Rfl:    No Known Allergies   Review of Systems  Constitutional: Negative.  Negative for fatigue.  HENT: Negative.   Respiratory: Negative.   Cardiovascular:  Negative.  Negative for chest pain, palpitations and leg swelling.  Gastrointestinal: Negative for abdominal pain, constipation, diarrhea and nausea.  Endocrine: Negative for polydipsia, polyphagia and polyuria.  Musculoskeletal: Negative.   Skin: Negative.   Neurological: Negative for dizziness.  Psychiatric/Behavioral: Negative.      Today's Vitals   09/23/20 1030  BP: 136/72  Pulse: 79  Temp: 98.2 F (36.8 C)  TempSrc: Oral  Weight: 208 lb (94.3 kg)  Height: 5' 0.6" (1.539 m)   Body mass index is 39.82 kg/m.  Wt Readings from Last 3 Encounters:  09/23/20 208 lb (94.3 kg)  08/27/20 209 lb (94.8 kg)  06/23/20 212 lb 3.2 oz (96.3 kg)   Objective:  Physical Exam Vitals reviewed.  Constitutional:      General: She is not in acute distress.    Appearance: She is well-developed. She is obese.  HENT:     Head: Normocephalic and atraumatic.  Eyes:     Pupils: Pupils are equal, round, and reactive to light.  Cardiovascular:     Rate and Rhythm: Normal rate and regular rhythm.     Pulses: Normal pulses.     Heart sounds: Normal heart sounds. No murmur heard.   Pulmonary:     Effort: Pulmonary effort is normal. No respiratory distress.     Breath sounds: Normal breath sounds. No wheezing.  Musculoskeletal:        General: Normal range of motion.  Skin:    General: Skin is warm and dry.     Capillary Refill: Capillary refill takes less than 2 seconds.  Neurological:     General: No focal deficit present.     Mental Status: She is alert and oriented to person, place, and time.     Cranial Nerves: No cranial nerve deficit.  Psychiatric:        Mood and Affect: Mood normal.        Behavior: Behavior normal.        Thought Content: Thought content normal.        Judgment: Judgment normal.         Assessment And Plan:     1. Uncontrolled type 2 diabetes mellitus with hyperglycemia (HCC)  Chronic, she reports her blood sugars have improved  Will check HgbA1c she  will have to come back to get labs.  She continues with levemir and novolog  2. Essential hypertension  Chronic, fair control  Continue with current medications     Patient was given opportunity to ask questions. Patient verbalized understanding of the plan and was able to repeat key elements of the plan. All questions were answered to their satisfaction.    Teola Bradley, FNP, have reviewed all documentation for this visit. The documentation on 09/23/20 for the exam, diagnosis, procedures, and orders are all accurate and complete.  THE PATIENT IS ENCOURAGED TO PRACTICE SOCIAL DISTANCING DUE TO THE COVID-19 PANDEMIC.

## 2020-09-23 NOTE — Patient Instructions (Signed)
Hypertension, Adult Hypertension is another name for high blood pressure. High blood pressure forces your heart to work harder to pump blood. This can cause problems over time. There are two numbers in a blood pressure reading. There is a top number (systolic) over a bottom number (diastolic). It is best to have a blood pressure that is below 120/80. Healthy choices can help lower your blood pressure, or you may need medicine to help lower it. What are the causes? The cause of this condition is not known. Some conditions may be related to high blood pressure. What increases the risk?  Smoking.  Having type 2 diabetes mellitus, high cholesterol, or both.  Not getting enough exercise or physical activity.  Being overweight.  Having too much fat, sugar, calories, or salt (sodium) in your diet.  Drinking too much alcohol.  Having long-term (chronic) kidney disease.  Having a family history of high blood pressure.  Age. Risk increases with age.  Race. You may be at higher risk if you are African American.  Gender. Men are at higher risk than women before age 45. After age 65, women are at higher risk than men.  Having obstructive sleep apnea.  Stress. What are the signs or symptoms?  High blood pressure may not cause symptoms. Very high blood pressure (hypertensive crisis) may cause: ? Headache. ? Feelings of worry or nervousness (anxiety). ? Shortness of breath. ? Nosebleed. ? A feeling of being sick to your stomach (nausea). ? Throwing up (vomiting). ? Changes in how you see. ? Very bad chest pain. ? Seizures. How is this treated?  This condition is treated by making healthy lifestyle changes, such as: ? Eating healthy foods. ? Exercising more. ? Drinking less alcohol.  Your health care provider may prescribe medicine if lifestyle changes are not enough to get your blood pressure under control, and if: ? Your top number is above 130. ? Your bottom number is above  80.  Your personal target blood pressure may vary. Follow these instructions at home: Eating and drinking   If told, follow the DASH eating plan. To follow this plan: ? Fill one half of your plate at each meal with fruits and vegetables. ? Fill one fourth of your plate at each meal with whole grains. Whole grains include whole-wheat pasta, brown rice, and whole-grain bread. ? Eat or drink low-fat dairy products, such as skim milk or low-fat yogurt. ? Fill one fourth of your plate at each meal with low-fat (lean) proteins. Low-fat proteins include fish, chicken without skin, eggs, beans, and tofu. ? Avoid fatty meat, cured and processed meat, or chicken with skin. ? Avoid pre-made or processed food.  Eat less than 1,500 mg of salt each day.  Do not drink alcohol if: ? Your doctor tells you not to drink. ? You are pregnant, may be pregnant, or are planning to become pregnant.  If you drink alcohol: ? Limit how much you use to:  0-1 drink a day for women.  0-2 drinks a day for men. ? Be aware of how much alcohol is in your drink. In the [place] one drink equals one 12 oz bottle of beer (355 mL), one 5 oz glass of wine (148 mL), or one 1 oz glass of hard liquor (44 mL). Lifestyle   Work with your doctor to stay at a healthy weight or to lose weight. Ask your doctor what the best weight is for you.  Get at least 30 minutes of exercise most   days of the week. This may include walking, swimming, or biking.  Get at least 30 minutes of exercise that strengthens your muscles (resistance exercise) at least 3 days a week. This may include lifting weights or doing Pilates.  Do not use any products that contain nicotine or tobacco, such as cigarettes, e-cigarettes, and chewing tobacco. If you need help quitting, ask your doctor.  Check your blood pressure at home as told by your doctor.  Keep all follow-up visits as told by your doctor. This is important. Medicines  Take over-the-counter  and prescription medicines only as told by your doctor. Follow directions carefully.  Do not skip doses of blood pressure medicine. The medicine does not work as well if you skip doses. Skipping doses also puts you at risk for problems.  Ask your doctor about side effects or reactions to medicines that you should watch for. Contact a doctor if you:  Think you are having a reaction to the medicine you are taking.  Have headaches that keep coming back (recurring).  Feel dizzy.  Have swelling in your ankles.  Have trouble with your vision. Get help right away if you:  Get a very bad headache.  Start to feel mixed up (confused).  Feel weak or numb.  Feel faint.  Have very bad pain in your: ? Chest. ? Belly (abdomen).  Throw up more than once.  Have trouble breathing. Summary  Hypertension is another name for high blood pressure.  High blood pressure forces your heart to work harder to pump blood.  For most people, a normal blood pressure is less than 120/80.  Making healthy choices can help lower blood pressure. If your blood pressure does not get lower with healthy choices, you may need to take medicine. This information is not intended to replace advice given to you by your health care provider. Make sure you discuss any questions you have with your health care provider. Document Revised: 06/06/2018 Document Reviewed: 06/06/2018 [employer] Patient Education  2020 [employer] Diabetes Mellitus and Exercise Exercising regularly is important for your overall health, especially when you have diabetes (diabetes mellitus). Exercising is not only about losing weight. It has many other health benefits, such as increasing muscle strength and bone density and reducing body fat and stress. This leads to improved fitness, flexibility, and endurance, all of which result in better overall health. Exercise has additional benefits for people with diabetes, including:  Reducing  appetite.  Helping to lower and control blood glucose.  Lowering blood pressure.  Helping to control amounts of fatty substances (lipids) in the blood, such as cholesterol and triglycerides.  Helping the body to respond better to insulin (improving insulin sensitivity).  Reducing how much insulin the body needs.  Decreasing the risk for heart disease by: ? Lowering cholesterol and triglyceride levels. ? Increasing the levels of good cholesterol. ? Lowering blood glucose levels. What is my activity plan? Your health care provider or certified diabetes educator can help you make a plan for the type and frequency of exercise (activity plan) that works for you. Make sure that you:  Do at least 150 minutes of moderate-intensity or vigorous-intensity exercise each week. This could be brisk walking, biking, or water aerobics. ? Do stretching and strength exercises, such as yoga or weightlifting, at least 2 times a week. ? Spread out your activity over at least 3 days of the week.  Get some form of physical activity every day. ? Do not go more than 2 days   in a row without some kind of physical activity. ? Avoid being inactive for more than 30 minutes at a time. Take frequent breaks to walk or stretch.  Choose a type of exercise or activity that you enjoy, and set realistic goals.  Start slowly, and gradually increase the intensity of your exercise over time. What do I need to know about managing my diabetes?   Check your blood glucose before and after exercising. ? If your blood glucose is 240 mg/dL (13.3 mmol/L) or higher before you exercise, check your urine for ketones. If you have ketones in your urine, do not exercise until your blood glucose returns to normal. ? If your blood glucose is 100 mg/dL (5.6 mmol/L) or lower, eat a snack containing 15-20 grams of carbohydrate. Check your blood glucose 15 minutes after the snack to make sure that your level is above 100 mg/dL (5.6 mmol/L)  before you start your exercise.  Know the symptoms of low blood glucose (hypoglycemia) and how to treat it. Your risk for hypoglycemia increases during and after exercise. Common symptoms of hypoglycemia can include: ? Hunger. ? Anxiety. ? Sweating and feeling clammy. ? Confusion. ? Dizziness or feeling light-headed. ? Increased heart rate or palpitations. ? Blurry vision. ? Tingling or numbness around the mouth, lips, or tongue. ? Tremors or shakes. ? Irritability.  Keep a rapid-acting carbohydrate snack available before, during, and after exercise to help prevent or treat hypoglycemia.  Avoid injecting insulin into areas of the body that are going to be exercised. For example, avoid injecting insulin into: ? The arms, when playing tennis. ? The legs, when jogging.  Keep records of your exercise habits. Doing this can help you and your health care provider adjust your diabetes management plan as needed. Write down: ? Food that you eat before and after you exercise. ? Blood glucose levels before and after you exercise. ? The type and amount of exercise you have done. ? When your insulin is expected to peak, if you use insulin. Avoid exercising at times when your insulin is peaking.  When you start a new exercise or activity, work with your health care provider to make sure the activity is safe for you, and to adjust your insulin, medicines, or food intake as needed.  Drink plenty of water while you exercise to prevent dehydration or heat stroke. Drink enough fluid to keep your urine clear or pale yellow. Summary  Exercising regularly is important for your overall health, especially when you have diabetes (diabetes mellitus).  Exercising has many health benefits, such as increasing muscle strength and bone density and reducing body fat and stress.  Your health care provider or certified diabetes educator can help you make a plan for the type and frequency of exercise (activity plan)  that works for you.  When you start a new exercise or activity, work with your health care provider to make sure the activity is safe for you, and to adjust your insulin, medicines, or food intake as needed. This information is not intended to replace advice given to you by your health care provider. Make sure you discuss any questions you have with your health care provider. Document Revised: 04/20/2017 Document Reviewed: 03/07/2016 Else[employer] Patient Education  2020 [employer]  

## 2020-09-29 ENCOUNTER — Other Ambulatory Visit: Payer: Medicaid Other

## 2020-10-04 ENCOUNTER — Other Ambulatory Visit: Payer: Self-pay | Admitting: Nurse Practitioner

## 2020-10-04 DIAGNOSIS — I10 Essential (primary) hypertension: Secondary | ICD-10-CM

## 2020-10-07 ENCOUNTER — Other Ambulatory Visit: Payer: Self-pay

## 2020-10-08 ENCOUNTER — Other Ambulatory Visit: Payer: Self-pay

## 2020-10-15 ENCOUNTER — Telehealth: Payer: Medicaid Other

## 2020-10-20 ENCOUNTER — Ambulatory Visit
Admission: RE | Admit: 2020-10-20 | Discharge: 2020-10-20 | Disposition: A | Discharge status: Home / Self Care | Payer: Medicaid Other | Source: Ambulatory Visit | Attending: Nurse Practitioner | Admitting: Nurse Practitioner

## 2020-10-20 ENCOUNTER — Other Ambulatory Visit: Payer: Self-pay

## 2020-10-20 DIAGNOSIS — R109 Unspecified abdominal pain: Secondary | ICD-10-CM

## 2020-10-20 DIAGNOSIS — R9389 Abnormal findings on diagnostic imaging of other specified body structures: Secondary | ICD-10-CM

## 2020-10-20 MED ORDER — IOPAMIDOL (ISOVUE-300) INJECTION 61%
80.0000 mL | Freq: Once | INTRAVENOUS | Status: AC | PRN
  Administered 2020-10-20: 80 mL via INTRAVENOUS

## 2020-11-21 ENCOUNTER — Emergency Department (HOSPITAL_COMMUNITY)
Admission: EM | Admit: 2020-11-21 | Discharge: 2020-11-21 | Disposition: A | Discharge status: Home / Self Care | Payer: Medicaid Other | Attending: Emergency Medicine | Admitting: Emergency Medicine

## 2020-11-21 ENCOUNTER — Emergency Department (HOSPITAL_COMMUNITY): Payer: Medicaid Other

## 2020-11-21 ENCOUNTER — Encounter (HOSPITAL_COMMUNITY): Payer: Self-pay | Admitting: Emergency Medicine

## 2020-11-21 DIAGNOSIS — Z794 Long term (current) use of insulin: Secondary | ICD-10-CM | POA: Diagnosis not present

## 2020-11-21 DIAGNOSIS — M546 Pain in thoracic spine: Secondary | ICD-10-CM

## 2020-11-21 DIAGNOSIS — N1832 Chronic kidney disease, stage 3b: Secondary | ICD-10-CM | POA: Diagnosis not present

## 2020-11-21 DIAGNOSIS — J45909 Unspecified asthma, uncomplicated: Secondary | ICD-10-CM | POA: Insufficient documentation

## 2020-11-21 DIAGNOSIS — Z79899 Other long term (current) drug therapy: Secondary | ICD-10-CM | POA: Diagnosis not present

## 2020-11-21 DIAGNOSIS — I129 Hypertensive chronic kidney disease with stage 1 through stage 4 chronic kidney disease, or unspecified chronic kidney disease: Secondary | ICD-10-CM | POA: Diagnosis not present

## 2020-11-21 DIAGNOSIS — Y92410 Unspecified street and highway as the place of occurrence of the external cause: Secondary | ICD-10-CM | POA: Diagnosis not present

## 2020-11-21 DIAGNOSIS — J449 Chronic obstructive pulmonary disease, unspecified: Secondary | ICD-10-CM | POA: Diagnosis not present

## 2020-11-21 DIAGNOSIS — E1122 Type 2 diabetes mellitus with diabetic chronic kidney disease: Secondary | ICD-10-CM | POA: Diagnosis not present

## 2020-11-21 DIAGNOSIS — R0789 Other chest pain: Secondary | ICD-10-CM | POA: Diagnosis present

## 2020-11-21 DIAGNOSIS — Z87891 Personal history of nicotine dependence: Secondary | ICD-10-CM | POA: Diagnosis not present

## 2020-11-21 MED ORDER — LIDOCAINE 5 % EX PTCH: 1.0000 | MEDICATED_PATCH | CUTANEOUS | 0 refills | Status: DC

## 2020-11-21 MED ORDER — OXYCODONE-ACETAMINOPHEN 5-325 MG PO TABS
1.0000 | ORAL_TABLET | Freq: Once | ORAL | Status: AC
  Administered 2020-11-21: 1 via ORAL
  Filled 2020-11-21: qty 1

## 2020-11-21 MED ORDER — METHOCARBAMOL 500 MG PO TABS
500.0000 mg | ORAL_TABLET | Freq: Once | ORAL | Status: AC
  Administered 2020-11-21: 500 mg via ORAL
  Filled 2020-11-21: qty 1
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
## 2020-11-21 NOTE — ED Notes (Signed)
Pt discharged from this ED in stable condition at this time. All discharge instructions and follow up care reviewed with pt with no further questions at this time. Pt ambulatory with steady gait, clear speech.  

## 2020-11-23 ENCOUNTER — Telehealth: Payer: Self-pay

## 2020-11-23 NOTE — Telephone Encounter (Signed)
Transition Care Management Follow-up Telephone Call  Date of discharge and from where: 11/21/20 from [facility]  How have you been since you were released from the hospital? Pt states that she is feeling okay, sore and still swollen but overall she is doing well.   Any questions or concerns? No  Items Reviewed:  Did the pt receive and understand the discharge instructions provided? Yes   Medications obtained and verified? Yes   Other? No   Any new allergies since your discharge? No   Dietary orders reviewed? N/A  Do you have support at home? Yes   Functional Questionnaire: (I = Independent and D = Dependent) ADLs: I  Bathing/Dressing- I  Meal Prep- I  Eating- I  Maintaining continence- I  Transferring/Ambulation- I  Managing Meds- I   Follow up appointments reviewed:   PCP Hospital f/u appt confirmed? Yes  Scheduled to see Arnette Felts, FNP on 12/03/20 @ 10:15am.  Are transportation arrangements needed? No   If their condition worsens, is the pt aware to call PCP or go to the Emergency Dept.? Yes  Was the patient provided with contact information for the PCP's office or ED? Yes  Was to pt encouraged to call back with questions or concerns? Yes

## 2020-11-24 ENCOUNTER — Other Ambulatory Visit: Payer: Self-pay | Admitting: Nurse Practitioner

## 2020-11-24 ENCOUNTER — Ambulatory Visit: Payer: Self-pay | Admitting: Nurse Practitioner

## 2020-11-24 DIAGNOSIS — I10 Essential (primary) hypertension: Secondary | ICD-10-CM

## 2020-11-24 DIAGNOSIS — K219 Gastro-esophageal reflux disease without esophagitis: Secondary | ICD-10-CM

## 2020-11-26 ENCOUNTER — Ambulatory Visit (INDEPENDENT_AMBULATORY_CARE_PROVIDER_SITE_OTHER): Payer: Medicaid Other | Admitting: Nurse Practitioner

## 2020-11-26 ENCOUNTER — Other Ambulatory Visit: Payer: Self-pay

## 2020-11-26 ENCOUNTER — Encounter: Payer: Self-pay | Admitting: Nurse Practitioner

## 2020-11-26 VITALS — BP 142/80 | HR 64 | Temp 98.2°F | Ht 60.6 in | Wt 209.6 lb

## 2020-11-26 DIAGNOSIS — M79601 Pain in right arm: Secondary | ICD-10-CM

## 2020-11-26 DIAGNOSIS — I10 Essential (primary) hypertension: Secondary | ICD-10-CM | POA: Diagnosis not present

## 2020-11-26 DIAGNOSIS — E1165 Type 2 diabetes mellitus with hyperglycemia: Secondary | ICD-10-CM | POA: Diagnosis not present

## 2020-11-26 DIAGNOSIS — M542 Cervicalgia: Secondary | ICD-10-CM | POA: Diagnosis not present

## 2020-11-26 LAB — CMP14+EGFR
ALT: 8 IU/L (ref 0–32)
AST: 11 IU/L (ref 0–40)
Albumin/Globulin Ratio: 1.6 (ref 1.2–2.2)
Albumin: 4.3 g/dL (ref 3.8–4.8)
Alkaline Phosphatase: 122 IU/L — ABNORMAL HIGH (ref 44–121)
BUN/Creatinine Ratio: 8 — ABNORMAL LOW (ref 12–28)
BUN: 10 mg/dL (ref 8–27)
Bilirubin Total: 0.2 mg/dL (ref 0.0–1.2)
CO2: 18 mmol/L — ABNORMAL LOW (ref 20–29)
Calcium: 9 mg/dL (ref 8.7–10.3)
Chloride: 105 mmol/L (ref 96–106)
Creatinine, Ser: 1.22 mg/dL — ABNORMAL HIGH (ref 0.57–1.00)
GFR calc Af Amer: 55 mL/min/{1.73_m2} — ABNORMAL LOW (ref >59)
GFR calc non Af Amer: 48 mL/min/{1.73_m2} — ABNORMAL LOW (ref >59)
Globulin, Total: 2.7 g/dL (ref 1.5–4.5)
Glucose: 137 mg/dL — ABNORMAL HIGH (ref 65–99)
Potassium: 4 mmol/L (ref 3.5–5.2)
Sodium: 139 mmol/L (ref 134–144)
Total Protein: 7 g/dL (ref 6.0–8.5)

## 2020-11-26 LAB — HEMOGLOBIN A1C
Est. average glucose Bld gHb Est-mCnc: 148 mg/dL
Hgb A1c MFr Bld: 6.8 % — ABNORMAL HIGH (ref 4.8–5.6)

## 2020-11-26 LAB — LIPID PANEL
Chol/HDL Ratio: 2.7 ratio (ref 0.0–4.4)
Cholesterol, Total: 152 mg/dL (ref 100–199)
HDL: 56 mg/dL (ref >39)
LDL Chol Calc (NIH): 84 mg/dL (ref 0–99)
Triglycerides: 60 mg/dL (ref 0–149)
VLDL Cholesterol Cal: 12 mg/dL (ref 5–40)

## 2020-11-26 MED ORDER — GVOKE HYPOPEN 2-PACK 1 MG/0.2ML ~~LOC~~ SOAJ: 1.0000 | SUBCUTANEOUS | 3 refills | Status: DC | PRN
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
## 2020-11-26 NOTE — Patient Instructions (Addendum)

## 2020-12-01 ENCOUNTER — Other Ambulatory Visit: Payer: Self-pay

## 2020-12-01 DIAGNOSIS — I10 Essential (primary) hypertension: Secondary | ICD-10-CM

## 2020-12-01 DIAGNOSIS — Z794 Long term (current) use of insulin: Secondary | ICD-10-CM

## 2020-12-01 DIAGNOSIS — K219 Gastro-esophageal reflux disease without esophagitis: Secondary | ICD-10-CM

## 2020-12-01 DIAGNOSIS — E1122 Type 2 diabetes mellitus with diabetic chronic kidney disease: Secondary | ICD-10-CM

## 2020-12-01 MED ORDER — LISINOPRIL 10 MG PO TABS: ORAL_TABLET | ORAL | 0 refills | Status: DC

## 2020-12-01 MED ORDER — OMEPRAZOLE 40 MG PO CPDR: DELAYED_RELEASE_CAPSULE | ORAL | 0 refills | Status: DC

## 2020-12-01 MED ORDER — GABAPENTIN 300 MG PO CAPS: 300.0000 mg | ORAL_CAPSULE | Freq: Three times a day (TID) | ORAL | 0 refills | Status: DC | PRN

## 2020-12-01 MED ORDER — BUPROPION HCL ER (XL) 300 MG PO TB24: 300.0000 mg | ORAL_TABLET | Freq: Every day | ORAL | 0 refills | Status: DC

## 2020-12-01 MED ORDER — NOVOLOG FLEXPEN 100 UNIT/ML ~~LOC~~ SOPN: 18.0000 [IU] | PEN_INJECTOR | Freq: Three times a day (TID) | SUBCUTANEOUS | 2 refills | Status: DC

## 2020-12-01 MED ORDER — LEVEMIR FLEXTOUCH 100 UNIT/ML ~~LOC~~ SOPN: 30.0000 [IU] | PEN_INJECTOR | Freq: Every day | SUBCUTANEOUS | 2 refills | Status: DC

## 2020-12-01 MED ORDER — OZEMPIC (1 MG/DOSE) 4 MG/3ML ~~LOC~~ SOPN: 1.0000 mg | PEN_INJECTOR | SUBCUTANEOUS | 1 refills | Status: DC

## 2020-12-03 ENCOUNTER — Ambulatory Visit: Payer: Self-pay | Admitting: Nurse Practitioner

## 2020-12-07 ENCOUNTER — Encounter: Payer: Self-pay | Admitting: Nurse Practitioner

## 2020-12-10 ENCOUNTER — Ambulatory Visit: Payer: Medicaid Other | Admitting: Podiatry

## 2020-12-14 ENCOUNTER — Other Ambulatory Visit: Payer: Self-pay

## 2020-12-14 DIAGNOSIS — N1832 Type 2 diabetes mellitus with diabetic chronic kidney disease: Secondary | ICD-10-CM

## 2020-12-14 DIAGNOSIS — E1122 Type 2 diabetes mellitus with diabetic chronic kidney disease: Secondary | ICD-10-CM

## 2020-12-14 DIAGNOSIS — Z794 Long term (current) use of insulin: Secondary | ICD-10-CM

## 2021-01-05 ENCOUNTER — Other Ambulatory Visit: Payer: Self-pay | Admitting: Nurse Practitioner

## 2021-01-25 ENCOUNTER — Other Ambulatory Visit: Payer: Self-pay | Admitting: Nurse Practitioner

## 2021-01-25 DIAGNOSIS — I10 Essential (primary) hypertension: Secondary | ICD-10-CM

## 2021-01-25 DIAGNOSIS — R519 Headache, unspecified: Secondary | ICD-10-CM

## 2021-01-25 DIAGNOSIS — M62838 Other muscle spasm: Secondary | ICD-10-CM

## 2021-01-27 ENCOUNTER — Other Ambulatory Visit: Payer: Self-pay

## 2021-01-27 DIAGNOSIS — N183 Type 2 diabetes mellitus with diabetic chronic kidney disease: Secondary | ICD-10-CM

## 2021-01-27 DIAGNOSIS — E1122 Type 2 diabetes mellitus with diabetic chronic kidney disease: Secondary | ICD-10-CM

## 2021-01-27 MED ORDER — LEVEMIR FLEXTOUCH 100 UNIT/ML ~~LOC~~ SOPN: 30.0000 [IU] | PEN_INJECTOR | Freq: Every day | SUBCUTANEOUS | 5 refills | Status: DC

## 2021-01-27 MED ORDER — NOVOLOG FLEXPEN 100 UNIT/ML ~~LOC~~ SOPN: 18.0000 [IU] | PEN_INJECTOR | Freq: Three times a day (TID) | SUBCUTANEOUS | 3 refills | Status: DC

## 2021-02-04 ENCOUNTER — Other Ambulatory Visit: Payer: Self-pay | Admitting: Nurse Practitioner

## 2021-02-05 NOTE — Telephone Encounter (Signed)
gabapentine 

## 2021-02-08 ENCOUNTER — Other Ambulatory Visit: Payer: Self-pay

## 2021-02-08 MED ORDER — GABAPENTIN 300 MG PO CAPS: 300.0000 mg | ORAL_CAPSULE | Freq: Three times a day (TID) | ORAL | 0 refills | Status: DC | PRN

## 2021-02-11 LAB — COMPREHENSIVE METABOLIC PANEL
Albumin: 4.4 (ref 3.5–5.0)
Calcium: 9.5 (ref 8.7–10.7)

## 2021-02-11 LAB — BASIC METABOLIC PANEL
BUN: 23 — AB (ref 4–21)
CO2: 24 — AB (ref 13–22)
Chloride: 106 (ref 99–108)
Creatinine: 1.4 — AB (ref 0.5–1.1)
Glucose: 170
Potassium: 5 (ref 3.4–5.3)
Sodium: 139 (ref 137–147)

## 2021-02-17 ENCOUNTER — Encounter: Payer: Self-pay | Admitting: Nurse Practitioner

## 2021-02-23 ENCOUNTER — Other Ambulatory Visit: Payer: Self-pay

## 2021-02-23 ENCOUNTER — Ambulatory Visit: Payer: Medicaid Other | Admitting: Podiatry

## 2021-02-23 ENCOUNTER — Ambulatory Visit: Payer: Medicaid Other | Admitting: Nurse Practitioner

## 2021-02-23 DIAGNOSIS — M79674 Pain in right toe(s): Secondary | ICD-10-CM

## 2021-02-23 DIAGNOSIS — M79675 Pain in left toe(s): Secondary | ICD-10-CM

## 2021-02-23 DIAGNOSIS — E1165 Type 2 diabetes mellitus with hyperglycemia: Secondary | ICD-10-CM

## 2021-02-23 DIAGNOSIS — B351 Tinea unguium: Secondary | ICD-10-CM

## 2021-02-28 NOTE — Progress Notes (Signed)
Subjective: 65 year old female presents the office today for concerns of thick, elongated toenails which she cannot trim her self.  She has some occasional discomfort at the tip of the left big toe.  There is no toenail there as it has been removed previously.  She denies recent injury or swelling.  No other concerns today.  Last A1c on November 26, 2020 was 6.8 Arnette Felts, FNP last seen November 26, 2020   Objective: AAO x3, NAD DP/PT pulses palpable bilaterally, CRT less than 3 seconds Nails are hypertrophic, dystrophic, brittle, discolored, elongated 8. No surrounding redness or drainage. Tenderness nails 2-5 bilaterally. No open lesions or pre-ulcerative lesions are identified today. There is a faint area of tenderness on the left hallux today but occasional have discomfort at the tip of the toe.  There is no area of pinpoint tenderness otherwise. No pain with calf compression, swelling, warmth, erythema  Assessment: Symptomatic onychomycosis, left heel pain  Plan: -All treatment options discussed with the patient including all alternatives, risks, complications.  -Debrided the nails x8 without any complications or bleeding. -I dispensed various offloading pads for the second digit.  I thi -Dispensed offloading pads for the hallux.  Discussed shoe modifications.   -Patient encouraged to call the office with any questions, concerns, change in symptoms.   Vivi Barrack DPM

## 2021-03-02 ENCOUNTER — Ambulatory Visit: Payer: Medicaid Other | Admitting: Nurse Practitioner

## 2021-03-04 ENCOUNTER — Ambulatory Visit: Payer: Medicaid Other | Admitting: Nurse Practitioner

## 2021-03-11 ENCOUNTER — Ambulatory Visit: Payer: Self-pay | Admitting: Nurse Practitioner

## 2021-03-23 ENCOUNTER — Ambulatory Visit: Payer: Medicaid Other | Admitting: Nurse Practitioner

## 2021-03-24 ENCOUNTER — Telehealth (INDEPENDENT_AMBULATORY_CARE_PROVIDER_SITE_OTHER): Payer: Medicaid Other | Admitting: Nurse Practitioner

## 2021-03-24 ENCOUNTER — Encounter: Payer: Self-pay | Admitting: Nurse Practitioner

## 2021-03-24 VITALS — BP 155/96 | HR 79

## 2021-03-24 DIAGNOSIS — N1832 Chronic kidney disease, stage 3b: Secondary | ICD-10-CM | POA: Diagnosis not present

## 2021-03-24 DIAGNOSIS — Z23 Encounter for immunization: Secondary | ICD-10-CM

## 2021-03-24 DIAGNOSIS — I129 Hypertensive chronic kidney disease with stage 1 through stage 4 chronic kidney disease, or unspecified chronic kidney disease: Secondary | ICD-10-CM

## 2021-03-24 DIAGNOSIS — Z794 Long term (current) use of insulin: Secondary | ICD-10-CM

## 2021-03-24 DIAGNOSIS — E1122 Type 2 diabetes mellitus with diabetic chronic kidney disease: Secondary | ICD-10-CM

## 2021-03-24 DIAGNOSIS — E113293 Type 2 diabetes mellitus with mild nonproliferative diabetic retinopathy without macular edema, bilateral: Secondary | ICD-10-CM

## 2021-03-24 MED ORDER — SHINGRIX 50 MCG/0.5ML IM SUSR: 0.5000 mL | Freq: Once | INTRAMUSCULAR | 0 refills | Status: AC

## 2021-03-24 NOTE — Progress Notes (Signed)
Virtual Visit via telephone, MyChart video failed   This visit type was conducted due to national recommendations for restrictions regarding the COVID-19 Pandemic (e.g. social distancing) in an effort to limit this patient's exposure and mitigate transmission in our community.  Due to her co-morbid illnesses, this patient is at least at moderate risk for complications without adequate follow up.  This format is felt to be most appropriate for this patient at this time.  All issues noted in this document were discussed and addressed.  A limited physical exam was performed with this format.    This visit type was conducted due to national recommendations for restrictions regarding the COVID-19 Pandemic (e.g. social distancing) in an effort to limit this patient's exposure and mitigate transmission in our community.  Patients identity confirmed using two different identifiers.  This format is felt to be most appropriate for this patient at this time.  All issues noted in this document were discussed and addressed.  No physical exam was performed (except for noted visual exam findings with Video Visits).    Date:  04/05/2021   ID:  Kara Kelley, DOB March 24, 1956, MRN [id number]  Patient Location:  Home - spoke with [name]  Provider location:   Office    Chief Complaint:  exposed to covid on Sunday, DM, and HTN  History of Present Illness:    Kara Kelley is a 65 y.o. female who presents via video conferencing for a telehealth visit today.    The patient does not have symptoms concerning for COVID-19 infection (fever, chills, cough, or new shortness of breath).   Virtual visit done to exposure to someone with Covid on Sunday. She is fully vaccinated and first covid booster. She is not currently having any symptoms.   She has been approved at the [facility] but has to pay her money  Will also do her diabetes and hypertension follow up. She will call for appt with  opthalmology  Diabetes She presents for her follow-up diabetic visit. She has type 2 diabetes mellitus. Her disease course has been stable. Pertinent negatives for hypoglycemia include no dizziness. Pertinent negatives for diabetes include no polydipsia, no polyphagia and no polyuria. There are no hypoglycemic complications. There are no diabetic complications. Risk factors for coronary artery disease include obesity, sedentary lifestyle, hypertension and diabetes mellitus. Current diabetic treatment includes oral agent (dual therapy). She is following a generally unhealthy diet. When asked about meal planning, she reported none. (The highest her blood sugar has been is 135.)  Hypertension This is a chronic problem. The current episode started more than 1 year ago. The problem has been gradually improving since onset. The problem is uncontrolled. Pertinent negatives include no shortness of breath. There are no associated agents to hypertension. Risk factors for coronary artery disease include obesity, sedentary lifestyle and diabetes mellitus. Past treatments include ACE inhibitors. There are no compliance problems.  There is no history of angina. There is no history of chronic renal disease.    Past Medical History:  Diagnosis Date   Anemia    Anxiety    Arthritis    Asthma    Bunion    Callus    Chronic pain    Cocaine abuse ([place])    COPD (chronic obstructive pulmonary disease) ([place])    Corns and callosities    Degenerative joint disease    Depression    Diabetes mellitus    Endometrial polyp    ETOH abuse    Gall  stones    GERD (gastroesophageal reflux disease)    Headache    history of Migraines   Hepatitis C    Hep C   Hyperlipidemia    Hypertension    Insomnia    Spondylolisthesis of lumbar region    Substance abuse ([place])    alcoholism   Tuberculosis 1985   Wears dentures    Wears glasses    Past Surgical History:  Procedure Laterality Date   BACK SURGERY  2018    CESAREAN SECTION     x3   CHOLECYSTECTOMY N/A 08/11/2016   Procedure: LAPAROSCOPIC CHOLECYSTECTOMY WITH   INTRAOPERATIVE CHOLANGIOGRAM;  Surgeon: Stark Klein, MD;  Location: [facility];  Service: General;  Laterality: N/A;   COLONOSCOPY     [place] Osteotomy w/ Graft Left 06/18/2009   Excision of Benign Lesion Right 01/30/2013   Rt Plantar   FOOT SURGERY     HAMMER TOE REPAIR Right 02/12/2016   RIGHT #5   Hammertoe Repair Left 06/18/2009   Lt #5   HYSTEROSCOPY N/A 06/20/2017   Procedure: DILATION AND CURETTAGE, HYSTEROSCOPY w/ Polypectomy;  Surgeon: Lavonia Drafts, MD;  Location: [facility];  Service: Gynecology;  Laterality: N/A;   LUMBAR FUSION  2018   METATARSAL OSTEOTOMY Left 06/18/2009   #5   MULTIPLE TOOTH EXTRACTIONS     Nail Matrixectomy Left 06/18/2009   LT #1   OSTEOTOMY Right 01/30/2013   Rt #5   Phalangectomy Left 06/18/2009   LT #1   Phalangectomy Right 01/30/2013   Rt #1   TUBAL LIGATION       Current Meds  Medication Sig   Accu-Chek FastClix Lancets MISC USE TO CHECK BLOOD GLUCOSE THREE TIMES DAILY   ACCU-CHEK GUIDE test strip USE TO TEST FOUR TIMES DAILY   albuterol (PROVENTIL HFA;VENTOLIN HFA) 108 (90 Base) MCG/ACT inhaler Inhale 2 puffs into the lungs every 6 (six) hours as needed. For shortness of breath. (Patient taking differently: Inhale 2 puffs into the lungs every 6 (six) hours as needed for wheezing or shortness of breath.)   ARIPiprazole (ABILIFY) 10 MG tablet Take 10 mg by mouth daily.    BD PEN NEEDLE NANO 2ND GEN 32G X 4 MM MISC USE THREE TIMES DAILY AS DIRECTED   Blood Glucose Monitoring Suppl (ACCU-CHEK NANO SMARTVIEW) w/Device KIT 1 each by Does not apply route 3 (three) times daily.   buPROPion (WELLBUTRIN XL) 300 MG 24 hr tablet Take 1 tablet (300 mg total) by mouth daily.   Cholecalciferol (VITAMIN D) 50 MCG (2000 UT) CAPS Take 2,000 Units by mouth daily.   gabapentin (NEURONTIN) 300 MG capsule Take 1 capsule (300 mg total) by mouth 3 (three) times daily  as needed (pain).   Glucagon (GVOKE HYPOPEN 2-PACK) 1 MG/0.2ML SOAJ Inject 1 each into the skin as needed. If blood sugar less than 60   insulin detemir (LEVEMIR FLEXTOUCH) 100 UNIT/ML FlexPen Inject 30 Units into the skin at bedtime.   LINZESS 145 MCG CAPS capsule Take 145 mcg by mouth daily before breakfast.    lisinopril (ZESTRIL) 10 MG tablet TAKE 1 TABLET(10 MG) BY MOUTH DAILY   NOVOLOG FLEXPEN 100 UNIT/ML FlexPen Inject 18 Units into the skin 3 (three) times daily with meals.   omeprazole (PRILOSEC) 40 MG capsule TAKE 1 CAPSULE(40 MG) BY MOUTH DAILY BEFORE BREAKFAST   spironolactone (ALDACTONE) 50 MG tablet TAKE 1 TABLET(50 MG) BY MOUTH DAILY   traZODone (DESYREL) 150 MG tablet Take 150 mg by mouth at bedtime.    [  EXPIRED] Zoster Vaccine Adjuvanted [facility] injection Inject 0.5 mLs into the muscle once for 1 dose.   [DISCONTINUED] cyclobenzaprine (FLEXERIL) 5 MG tablet TAKE 1 TABLET(5 MG) BY MOUTH THREE TIMES DAILY AS NEEDED FOR MUSCLE SPASMS   [DISCONTINUED] Semaglutide, 1 MG/DOSE, (OZEMPIC, 1 MG/DOSE,) 4 MG/3ML SOPN Inject 1 mg into the skin once a week.     Allergies:   Patient has no known allergies.   Social History   Tobacco Use   Smoking status: Former    Packs/day: 0.50    Pack years: 0.00    Types: Cigarettes    Quit date: 2020    Years since quitting: 2.4   Smokeless tobacco: Never  Vaping Use   Vaping Use: Never used  Substance Use Topics   Alcohol use: No    Alcohol/week: 0.0 standard drinks    Comment: in rehab   Drug use: No    Comment: in rehab     Family Hx: The patient's family history includes Cirrhosis in some other family members; Diabetes in an other family member; Heart disease in her mother. There is no history of Colon cancer or Breast cancer.  ROS:   Please see the history of present illness.    Review of Systems  Constitutional: Negative.   Respiratory: Negative.  Negative for shortness of breath.   Cardiovascular: Negative.    Neurological:  Negative for dizziness and tingling.  Endo/Heme/Allergies:  Negative for polydipsia and polyphagia.  Psychiatric/Behavioral: Negative.     All other systems reviewed and are negative.   Labs/Other Tests and Data Reviewed:    Recent Labs: 06/23/2020: Hemoglobin 9.9; Platelets 217; TSH 2.360 11/26/2020: ALT 8 02/11/2021: BUN 23; Creatinine 1.4; Potassium 5.0; Sodium 139   Recent Lipid Panel Lab Results  Component Value Date/Time   CHOL 152 11/26/2020 11:13 AM   TRIG 60 11/26/2020 11:13 AM   HDL 56 11/26/2020 11:13 AM   CHOLHDL 2.7 11/26/2020 11:13 AM   LDLCALC 84 11/26/2020 11:13 AM    Wt Readings from Last 3 Encounters:  11/26/20 209 lb 9.6 oz (95.1 kg)  09/23/20 208 lb (94.3 kg)  08/27/20 209 lb (94.8 kg)     Exam:    Vital Signs:  BP (!) 155/96   Pulse 79     Physical Exam Vitals reviewed.  Constitutional:      General: She is not in acute distress. Pulmonary:     Effort: Pulmonary effort is normal. No respiratory distress.  Neurological:     Mental Status: She is alert and oriented to person, place, and time.     Cranial Nerves: No cranial nerve deficit.     Motor: No weakness.  Psychiatric:        Mood and Affect: Mood and affect normal.        Behavior: Behavior normal.        Thought Content: Thought content normal.        Cognition and Memory: Memory normal.        Judgment: Judgment normal.    ASSESSMENT & PLAN:    1. Essential hypertension Chronic, blood pressure is slightly elevated this visit she has checked her blood pressure at home She is encouraged to limit intake of salt  2. Type 2 diabetes mellitus with stage 3b chronic kidney disease, with long-term current use of insulin (HCC) Chronic, controlled Continue with current medications Encouraged to limit intake of sugary foods and drinks Encouraged to increase physical activity to 150 minutes per week - Hemoglobin  A1c  3. Encounter for immunization Rx for shingrix sent to  pharmacy, she is aware to not get if she is not feeling well.  - Zoster Vaccine Adjuvanted [facility]) injection; Inject 0.5 mLs into the muscle once for 1 dose.  Dispense: 0.5 mL; Refill: 0  4. Mild nonproliferative diabetic retinopathy of both eyes without macular edema associated with type 2 diabetes mellitus ([place]) Continue follow up with opthalmologist   COVID-19 Education: The signs and symptoms of COVID-19 were discussed with the patient and how to seek care for testing (follow up with PCP or arrange E-visit).  The importance of social distancing was discussed today.  Patient Risk:   After full review of this patients clinical status, I feel that they are at least moderate risk at this time.  Time:   Today, I have spent 11 minutes/ seconds with the patient with telehealth technology discussing above diagnoses.     Medication Adjustments/Labs and Tests Ordered: Current medicines are reviewed at length with the patient today.  Concerns regarding medicines are outlined above.   Tests Ordered: Orders Placed This Encounter  Procedures   Hemoglobin A1c     Medication Changes: Meds ordered this encounter  Medications   Zoster Vaccine Adjuvanted [facility]) injection    Sig: Inject 0.5 mLs into the muscle once for 1 dose.    Dispense:  0.5 mL    Refill:  0     Disposition:  Follow up in 3 month(s) will come for labs next week  Signed, [name], FNP

## 2021-03-29 ENCOUNTER — Other Ambulatory Visit: Payer: Self-pay

## 2021-03-29 MED ORDER — OZEMPIC (1 MG/DOSE) 4 MG/3ML ~~LOC~~ SOPN: 1.0000 mg | PEN_INJECTOR | SUBCUTANEOUS | 1 refills | Status: DC

## 2021-04-04 ENCOUNTER — Other Ambulatory Visit: Payer: Self-pay | Admitting: Nurse Practitioner

## 2021-04-04 DIAGNOSIS — M62838 Other muscle spasm: Secondary | ICD-10-CM

## 2021-04-04 DIAGNOSIS — R519 Headache, unspecified: Secondary | ICD-10-CM

## 2021-04-06 ENCOUNTER — Other Ambulatory Visit: Payer: Self-pay

## 2021-04-06 MED ORDER — ONDANSETRON HCL 4 MG PO TABS: 4.0000 mg | ORAL_TABLET | Freq: Three times a day (TID) | ORAL | 0 refills | Status: DC | PRN

## 2021-04-20 ENCOUNTER — Other Ambulatory Visit: Payer: Self-pay | Admitting: Nurse Practitioner

## 2021-04-20 DIAGNOSIS — I10 Essential (primary) hypertension: Secondary | ICD-10-CM

## 2021-04-20 DIAGNOSIS — Z1231 Encounter for screening mammogram for malignant neoplasm of breast: Secondary | ICD-10-CM

## 2021-05-05 ENCOUNTER — Other Ambulatory Visit: Payer: Self-pay

## 2021-05-05 ENCOUNTER — Ambulatory Visit: Payer: Medicaid Other | Attending: Nurse Practitioner | Admitting: Physician Assistant

## 2021-05-05 NOTE — Progress Notes (Deleted)
Patient ID: Kara Kelley, female   DOB: 04/21/1956, 65 y.o.   MRN: [id number]

## 2021-05-12 ENCOUNTER — Other Ambulatory Visit: Payer: Self-pay

## 2021-05-12 MED ORDER — OZEMPIC (1 MG/DOSE) 4 MG/3ML ~~LOC~~ SOPN: 1.0000 mg | PEN_INJECTOR | SUBCUTANEOUS | 0 refills | Status: DC

## 2021-05-17 ENCOUNTER — Other Ambulatory Visit: Payer: Self-pay | Admitting: Nurse Practitioner

## 2021-05-17 DIAGNOSIS — K219 Gastro-esophageal reflux disease without esophagitis: Secondary | ICD-10-CM

## 2021-05-17 MED ORDER — OMEPRAZOLE 40 MG PO CPDR
DELAYED_RELEASE_CAPSULE | ORAL | 1 refills | Status: DC
Start: 2021-05-17 — End: 2021-10-12

## 2021-05-17 NOTE — Progress Notes (Signed)
Looks like the patient has transferred care to [facility], was to be seen on 7/27, no showed.

## 2021-05-20 ENCOUNTER — Other Ambulatory Visit: Payer: Self-pay

## 2021-05-20 DIAGNOSIS — I10 Essential (primary) hypertension: Secondary | ICD-10-CM

## 2021-05-20 MED ORDER — LISINOPRIL 10 MG PO TABS: ORAL_TABLET | ORAL | 0 refills | Status: DC

## 2021-05-24 ENCOUNTER — Other Ambulatory Visit: Payer: Self-pay

## 2021-05-25 ENCOUNTER — Ambulatory Visit: Payer: Medicaid Other | Admitting: Podiatry

## 2021-06-11 ENCOUNTER — Other Ambulatory Visit: Payer: Self-pay

## 2021-06-11 ENCOUNTER — Ambulatory Visit
Admission: RE | Admit: 2021-06-11 | Discharge: 2021-06-11 | Disposition: A | Discharge status: Home / Self Care | Payer: Medicaid Other | Source: Ambulatory Visit | Attending: Nurse Practitioner | Admitting: Nurse Practitioner

## 2021-06-11 DIAGNOSIS — Z1231 Encounter for screening mammogram for malignant neoplasm of breast: Secondary | ICD-10-CM

## 2021-06-15 ENCOUNTER — Other Ambulatory Visit: Payer: Self-pay | Admitting: Nurse Practitioner

## 2021-06-15 DIAGNOSIS — M62838 Other muscle spasm: Secondary | ICD-10-CM

## 2021-06-15 DIAGNOSIS — R519 Headache, unspecified: Secondary | ICD-10-CM

## 2021-06-15 MED ORDER — CYCLOBENZAPRINE HCL 5 MG PO TABS: ORAL_TABLET | ORAL | 1 refills | Status: DC

## 2021-06-22 LAB — BASIC METABOLIC PANEL
CO2: 22 (ref 13–22)
Chloride: 109 — AB (ref 99–108)
Creatinine: 1 (ref 0.5–1.1)
Glucose: 75
Potassium: 4.3 (ref 3.4–5.3)
Sodium: 141 (ref 137–147)

## 2021-06-22 LAB — LIPID PANEL
Cholesterol: 139 (ref 0–200)
HDL: 5 — AB (ref 35–70)
LDL Cholesterol: 74
Triglycerides: 72 (ref 40–160)

## 2021-06-22 LAB — HEPATIC FUNCTION PANEL
ALT: 14 (ref 7–35)
AST: 13 (ref 13–35)
Alkaline Phosphatase: 114 (ref 25–125)
Bilirubin, Total: 0.3

## 2021-06-22 LAB — CBC AND DIFFERENTIAL
HCT: 34 — AB (ref 36–46)
Hemoglobin: 11.2 — AB (ref 12.0–16.0)
Platelets: 191 (ref 150–399)
WBC: 5.6

## 2021-06-22 LAB — CBC: RBC: 4.49 (ref 3.87–5.11)

## 2021-06-24 ENCOUNTER — Encounter: Payer: Medicaid Other | Admitting: Nurse Practitioner

## 2021-06-29 ENCOUNTER — Ambulatory Visit (INDEPENDENT_AMBULATORY_CARE_PROVIDER_SITE_OTHER): Payer: Medicaid Other | Admitting: Nurse Practitioner

## 2021-06-29 ENCOUNTER — Other Ambulatory Visit: Payer: Self-pay

## 2021-06-29 ENCOUNTER — Encounter: Payer: Self-pay | Admitting: Nurse Practitioner

## 2021-06-29 VITALS — BP 110/80 | HR 98 | Temp 98.1°F | Ht 60.0 in | Wt 197.4 lb

## 2021-06-29 DIAGNOSIS — H6122 Impacted cerumen, left ear: Secondary | ICD-10-CM

## 2021-06-29 DIAGNOSIS — I10 Essential (primary) hypertension: Secondary | ICD-10-CM | POA: Diagnosis not present

## 2021-06-29 DIAGNOSIS — E1165 Type 2 diabetes mellitus with hyperglycemia: Secondary | ICD-10-CM

## 2021-06-29 DIAGNOSIS — Z0000 Encounter for general adult medical examination without abnormal findings: Secondary | ICD-10-CM | POA: Diagnosis not present

## 2021-06-29 DIAGNOSIS — Z23 Encounter for immunization: Secondary | ICD-10-CM

## 2021-06-29 DIAGNOSIS — R0602 Shortness of breath: Secondary | ICD-10-CM

## 2021-06-29 DIAGNOSIS — R519 Headache, unspecified: Secondary | ICD-10-CM

## 2021-06-29 LAB — CMP14+EGFR
ALT: 13 IU/L (ref 0–32)
AST: 14 IU/L (ref 0–40)
Albumin/Globulin Ratio: 1.5 (ref 1.2–2.2)
Albumin: 4.3 g/dL (ref 3.8–4.8)
Alkaline Phosphatase: 129 IU/L — ABNORMAL HIGH (ref 44–121)
BUN/Creatinine Ratio: 13 (ref 12–28)
BUN: 15 mg/dL (ref 8–27)
Bilirubin Total: 0.3 mg/dL (ref 0.0–1.2)
CO2: 20 mmol/L (ref 20–29)
Calcium: 9.8 mg/dL (ref 8.7–10.3)
Chloride: 109 mmol/L — ABNORMAL HIGH (ref 96–106)
Creatinine, Ser: 1.13 mg/dL — ABNORMAL HIGH (ref 0.57–1.00)
Globulin, Total: 2.8 g/dL (ref 1.5–4.5)
Glucose: 148 mg/dL — ABNORMAL HIGH (ref 65–99)
Potassium: 5.6 mmol/L — ABNORMAL HIGH (ref 3.5–5.2)
Sodium: 141 mmol/L (ref 134–144)
Total Protein: 7.1 g/dL (ref 6.0–8.5)
eGFR: 55 mL/min/{1.73_m2} — ABNORMAL LOW (ref >59)

## 2021-06-29 LAB — CBC
Hematocrit: 33.9 % — ABNORMAL LOW (ref 34.0–46.6)
Hemoglobin: 11.1 g/dL (ref 11.1–15.9)
MCH: 24.5 pg — ABNORMAL LOW (ref 26.6–33.0)
MCHC: 32.7 g/dL (ref 31.5–35.7)
MCV: 75 fL — ABNORMAL LOW (ref 79–97)
Platelets: 195 10*3/uL (ref 150–450)
RBC: 4.53 x10E6/uL (ref 3.77–5.28)
RDW: 13 % (ref 11.7–15.4)
WBC: 5.9 10*3/uL (ref 3.4–10.8)

## 2021-06-29 LAB — LIPID PANEL
Chol/HDL Ratio: 2.8 ratio (ref 0.0–4.4)
Cholesterol, Total: 141 mg/dL (ref 100–199)
HDL: 50 mg/dL (ref >39)
LDL Chol Calc (NIH): 75 mg/dL (ref 0–99)
Triglycerides: 85 mg/dL (ref 0–149)
VLDL Cholesterol Cal: 16 mg/dL (ref 5–40)

## 2021-06-29 LAB — HEMOGLOBIN A1C
Est. average glucose Bld gHb Est-mCnc: 151 mg/dL
Hgb A1c MFr Bld: 6.9 % — ABNORMAL HIGH (ref 4.8–5.6)

## 2021-06-29 MED ORDER — ALBUTEROL SULFATE HFA 108 (90 BASE) MCG/ACT IN AERS
2.0000 | INHALATION_SPRAY | Freq: Four times a day (QID) | RESPIRATORY_TRACT | 2 refills | Status: DC | PRN
Start: 2021-06-29 — End: 2023-12-15

## 2021-06-29 MED ORDER — KETOROLAC TROMETHAMINE 30 MG/ML IJ SOLN
30.0000 mg | Freq: Once | INTRAMUSCULAR | Status: AC
  Administered 2021-06-29: 30 mg via INTRAMUSCULAR

## 2021-06-29 NOTE — Patient Instructions (Signed)
Health Maintenance, Female Adopting a healthy lifestyle and getting preventive care are important in promoting health and wellness. Ask your health care provider about: The right schedule for you to have regular tests and exams. Things you can do on your own to prevent diseases and keep yourself healthy. What should I know about diet, weight, and exercise? Eat a healthy diet  Eat a diet that includes plenty of vegetables, fruits, low-fat dairy products, and lean protein. Do not eat a lot of foods that are high in solid fats, added sugars, or sodium. Maintain a healthy weight Body mass index (BMI) is used to identify weight problems. It estimates body fat based on height and weight. Your health care provider can help determine your BMI and help you achieve or maintain a healthy weight. Get regular exercise Get regular exercise. This is one of the most important things you can do for your health. Most adults should: Exercise for at least 150 minutes each week. The exercise should increase your heart rate and make you sweat (moderate-intensity exercise). Do strengthening exercises at least twice a week. This is in addition to the moderate-intensity exercise. Spend less time sitting. Even light physical activity can be beneficial. Watch cholesterol and blood lipids Have your blood tested for lipids and cholesterol at 63 years of age, then have this test every 5 years. Have your cholesterol levels checked more often if: Your lipid or cholesterol levels are high. You are older than 63 years of age. You are at high risk for heart disease. What should I know about cancer screening? Depending on your health history and family history, you may need to have cancer screening at various ages. This may include screening for: Breast cancer. Cervical cancer. Colorectal cancer. Skin cancer. Lung cancer. What should I know about heart disease, diabetes, and high blood pressure? Blood pressure and heart  disease High blood pressure causes heart disease and increases the risk of stroke. This is more likely to develop in people who have high blood pressure readings, are of African descent, or are overweight. Have your blood pressure checked: Every 3-5 years if you are 18-39 years of age. Every year if you are 40 years old or older. Diabetes Have regular diabetes screenings. This checks your fasting blood sugar level. Have the screening done: Once every three years after age 40 if you are at a normal weight and have a low risk for diabetes. More often and at a younger age if you are overweight or have a high risk for diabetes. What should I know about preventing infection? Hepatitis B If you have a higher risk for hepatitis B, you should be screened for this virus. Talk with your health care provider to find out if you are at risk for hepatitis B infection. Hepatitis C Testing is recommended for: Everyone born from 1945 through 1965. Anyone with known risk factors for hepatitis C. Sexually transmitted infections (STIs) Get screened for STIs, including gonorrhea and chlamydia, if: You are sexually active and are younger than 63 years of age. You are older than 63 years of age and your health care provider tells you that you are at risk for this type of infection. Your sexual activity has changed since you were last screened, and you are at increased risk for chlamydia or gonorrhea. Ask your health care provider if you are at risk. Ask your health care provider about whether you are at high risk for HIV. Your health care provider may recommend a prescription medicine   to help prevent HIV infection. If you choose to take medicine to prevent HIV, you should first get tested for HIV. You should then be tested every 3 months for as long as you are taking the medicine. Pregnancy If you are about to stop having your period (premenopausal) and you may become pregnant, seek counseling before you get  pregnant. Take 400 to 800 micrograms (mcg) of folic acid every day if you become pregnant. Ask for birth control (contraception) if you want to prevent pregnancy. Osteoporosis and menopause Osteoporosis is a disease in which the bones lose minerals and strength with aging. This can result in bone fractures. If you are 65 years old or older, or if you are at risk for osteoporosis and fractures, ask your health care provider if you should: Be screened for bone loss. Take a calcium or vitamin D supplement to lower your risk of fractures. Be given hormone replacement therapy (HRT) to treat symptoms of menopause. Follow these instructions at home: Lifestyle Do not use any products that contain nicotine or tobacco, such as cigarettes, e-cigarettes, and chewing tobacco. If you need help quitting, ask your health care provider. Do not use street drugs. Do not share needles. Ask your health care provider for help if you need support or information about quitting drugs. Alcohol use Do not drink alcohol if: Your health care provider tells you not to drink. You are pregnant, may be pregnant, or are planning to become pregnant. If you drink alcohol: Limit how much you use to 0-1 drink a day. Limit intake if you are breastfeeding. Be aware of how much alcohol is in your drink. In the [place] one drink equals one 12 oz bottle of beer (355 mL), one 5 oz glass of wine (148 mL), or one 1 oz glass of hard liquor (44 mL). General instructions Schedule regular health, dental, and eye exams. Stay current with your vaccines. Tell your health care provider if: You often feel depressed. You have ever been abused or do not feel safe at home. Summary Adopting a healthy lifestyle and getting preventive care are important in promoting health and wellness. Follow your health care provider's instructions about healthy diet, exercising, and getting tested or screened for diseases. Follow your health care provider's  instructions on monitoring your cholesterol and blood pressure. This information is not intended to replace advice given to you by your health care provider. Make sure you discuss any questions you have with your health care provider. Document Revised: 12/04/2020 Document Reviewed: 09/19/2018 Elsevier Patient Education  2022 Elsevier Inc.  

## 2021-06-29 NOTE — Progress Notes (Signed)
This visit occurred during the SARS-CoV-2 public health emergency.  Safety protocols were in place, including screening questions prior to the visit, additional usage of staff PPE, and extensive cleaning of exam room while observing appropriate contact time as indicated for disinfecting solutions.  Subjective:     Patient ID: Kara Kelley , female    DOB: 1956/02/07 , 65 y.o.   MRN: [id number]   Chief Complaint  Patient presents with   Annual Exam    HPI  The patient states she is post menopausal status   No LMP recorded. Patient is postmenopausal.. Negative for Dysmenorrhea and Negative for Menorrhagia. Negative for: breast discharge, breast lump(s), breast pain and breast self exam. Associated symptoms include abnormal vaginal bleeding. Pertinent negatives include abnormal bleeding (hematology), anxiety, decreased libido, depression, difficulty falling sleep, dyspareunia, history of infertility, nocturia, sexual dysfunction, sleep disturbances, urinary incontinence, urinary urgency, vaginal discharge and vaginal itching. Diet regular; she tries to avoid sweets and cut back on potato chips. The patient states her exercise level is minimal - she is going to the gym on Monday and Thursday - walking around the track, bike and gets in pool and sauna (stays about 15-30 minutes)  The patient's tobacco use is:  Social History   Tobacco Use  Smoking Status Former   Packs/day: 0.50   Types: Cigarettes   Quit date: 2020   Years since quitting: 2.7  Smokeless Tobacco Never   She has been exposed to passive smoke. The patient's alcohol use is:  Social History   Substance and Sexual Activity  Alcohol Use No   Alcohol/week: 0.0 standard drinks   Comment: in rehab   Additional information: Last pap 06/19/2019, next one scheduled for 06/18/2022.  Pt presents today for HM. She has seen Dr. Stann Mainland for her knee - she was given an injection - she is being treated for arthritis  Pt would like an  refill on her albuterol inhaler & zofran. Pt stated she is experiencing pain in her back, legs & knees. She rates pain 8/10. She would like to get her shingles shot today if she can.   She is not looking for a new PCP.  She also needs a new glucometer.   She is unable to take ibuprofen due to irritating her stomach.   Diabetes She presents for her follow-up diabetic visit. She has type 2 diabetes mellitus. Hypoglycemia symptoms include headaches (2 week history consistent headaches with yesterday being the worse - took tylenol without effect.). Pertinent negatives for hypoglycemia include no dizziness. Pertinent negatives for diabetes include no chest pain. She has not had a previous visit with a dietitian. She participates in exercise three times a week. Eye exam current: She is scheduled in October at [facility].    Past Medical History:  Diagnosis Date   Anemia    Anxiety    Arthritis    Asthma    Bunion    Callus    Chronic pain    Cocaine abuse ([place])    COPD (chronic obstructive pulmonary disease) (HCC)    Corns and callosities    Degenerative joint disease    Depression    Diabetes mellitus    Endometrial polyp    ETOH abuse    Gall stones    GERD (gastroesophageal reflux disease)    Headache    history of Migraines   Hepatitis C    Hep C   Hyperlipidemia    Hypertension    Insomnia    Spondylolisthesis  of lumbar region    Substance abuse ([place])    alcoholism   Tuberculosis 1985   Wears dentures    Wears glasses      Family History  Problem Relation Age of Onset   Heart disease Mother    Diabetes Other        mat great aunt   Cirrhosis Other        mat great aunt   Cirrhosis Other        mat great uncles x 2   Colon cancer Neg Hx    Breast cancer Neg Hx      Current Outpatient Medications:    Accu-Chek FastClix Lancets MISC, USE TO CHECK BLOOD GLUCOSE THREE TIMES DAILY, Disp: 102 each, Rfl: prn   ACCU-CHEK GUIDE test strip, USE TO TEST FOUR TIMES DAILY, Disp:  300 strip, Rfl: 2   BD PEN NEEDLE NANO 2ND GEN 32G X 4 MM MISC, USE THREE TIMES DAILY AS DIRECTED, Disp: 200 each, Rfl: 3   Blood Glucose Monitoring Suppl (ACCU-CHEK NANO SMARTVIEW) w/Device KIT, 1 each by Does not apply route 3 (three) times daily., Disp: 1 kit, Rfl: 0   Cholecalciferol (VITAMIN D) 50 MCG (2000 UT) CAPS, Take 2,000 Units by mouth daily., Disp: , Rfl:    cyclobenzaprine (FLEXERIL) 5 MG tablet, TAKE 1 TABLET(5 MG) BY MOUTH THREE TIMES DAILY AS NEEDED FOR MUSCLE SPASMS, Disp: 30 tablet, Rfl: 1   gabapentin (NEURONTIN) 300 MG capsule, TAKE 1 CAPSULE(300 MG) BY MOUTH THREE TIMES DAILY AS NEEDED FOR PAIN, Disp: 180 capsule, Rfl: 0   Glucagon (GVOKE HYPOPEN 2-PACK) 1 MG/0.2ML SOAJ, Inject 1 each into the skin as needed. If blood sugar less than 60, Disp: 0.4 mL, Rfl: 3   insulin detemir (LEVEMIR FLEXTOUCH) 100 UNIT/ML FlexPen, Inject 30 Units into the skin at bedtime., Disp: 15 mL, Rfl: 5   LINZESS 145 MCG CAPS capsule, Take 145 mcg by mouth daily before breakfast. , Disp: , Rfl:    lisinopril (ZESTRIL) 10 MG tablet, TAKE 1 TABLET(10 MG) BY MOUTH DAILY, Disp: 90 tablet, Rfl: 0   NOVOLOG FLEXPEN 100 UNIT/ML FlexPen, Inject 18 Units into the skin 3 (three) times daily with meals., Disp: 15 mL, Rfl: 3   omeprazole (PRILOSEC) 40 MG capsule, TAKE 1 CAPSULE(40 MG) BY MOUTH DAILY BEFORE BREAKFAST, Disp: 90 capsule, Rfl: 1   ondansetron (ZOFRAN) 4 MG tablet, Take 1 tablet (4 mg total) by mouth 3 (three) times daily as needed for nausea or vomiting., Disp: 20 tablet, Rfl: 0   Semaglutide, 1 MG/DOSE, (OZEMPIC, 1 MG/DOSE,) 4 MG/3ML SOPN, Inject 1 mg into the skin once a week., Disp: 9 mL, Rfl: 0   spironolactone (ALDACTONE) 50 MG tablet, TAKE 1 TABLET(50 MG) BY MOUTH DAILY, Disp: 90 tablet, Rfl: 0   traZODone (DESYREL) 150 MG tablet, Take 150 mg by mouth at bedtime. , Disp: , Rfl:    albuterol (VENTOLIN HFA) 108 (90 Base) MCG/ACT inhaler, Inhale 2 puffs into the lungs every 6 (six) hours as needed  for wheezing or shortness of breath., Disp: 18 g, Rfl: 2   ARIPiprazole (ABILIFY) 10 MG tablet, Take 10 mg by mouth daily. , Disp: , Rfl:    baclofen (LIORESAL) 10 MG tablet, Take 1 tablet (10 mg total) by mouth at bedtime for 3 days., Disp: 3 each, Rfl: 0   buPROPion (WELLBUTRIN XL) 300 MG 24 hr tablet, TAKE 1 TABLET(300 MG) BY MOUTH DAILY (Patient not taking: Reported on 06/29/2021), Disp: 90 tablet,  Rfl: 0   methylPREDNISolone (MEDROL DOSEPAK) 4 MG TBPK tablet, Take 1 row of tablets daily until complete., Disp: 21 tablet, Rfl: 0   promethazine (PHENERGAN) 25 MG tablet, Take 1 tablet (25 mg total) by mouth every 8 (eight) hours as needed for up to 3 days for nausea or vomiting., Disp: 9 tablet, Rfl: 0   No Known Allergies        Review of Systems  Constitutional: Negative.   HENT: Negative.    Eyes: Negative.   Respiratory: Negative.    Cardiovascular: Negative.  Negative for chest pain, palpitations and leg swelling.  Gastrointestinal: Negative.   Endocrine: Negative.   Genitourinary: Negative.   Musculoskeletal:  Positive for arthralgias (right knee and hip pain - she has an appt with Orthopedics).  Skin: Negative.   Allergic/Immunologic: Negative.   Neurological:  Positive for headaches (2 week history consistent headaches with yesterday being the worse - took tylenol without effect.). Negative for dizziness.  Hematological: Negative.   Psychiatric/Behavioral: Negative.      Today's Vitals   06/29/21 0925  BP: 110/80  Pulse: 98  Temp: 98.1 F (36.7 C)  Weight: 197 lb 6.4 oz (89.5 kg)  Height: 5' (1.524 m)  PainSc: 8    Body mass index is 38.55 kg/m.   Objective:  Physical Exam Constitutional:      General: She is not in acute distress.    Appearance: Normal appearance. She is well-developed. She is obese.  HENT:     Head: Normocephalic and atraumatic.     Right Ear: Hearing, tympanic membrane, ear canal and external ear normal. There is no impacted cerumen.      Left Ear: Hearing, tympanic membrane, ear canal and external ear normal. There is no impacted cerumen.     Nose:     Comments: Deferred - masked    Mouth/Throat:     Comments: Deferred - masked Eyes:     General: Lids are normal.     Extraocular Movements: Extraocular movements intact.     Conjunctiva/sclera: Conjunctivae normal.     Pupils: Pupils are equal, round, and reactive to light.     Funduscopic exam:    Right eye: No papilledema.        Left eye: No papilledema.  Neck:     Thyroid: No thyroid mass.     Vascular: No carotid bruit.  Cardiovascular:     Rate and Rhythm: Normal rate and regular rhythm.     Pulses: Normal pulses.     Heart sounds: Normal heart sounds. No murmur heard. Pulmonary:     Effort: Pulmonary effort is normal. No respiratory distress.     Breath sounds: Normal breath sounds. No wheezing.  Abdominal:     General: Abdomen is flat. Bowel sounds are normal. There is no distension.     Palpations: Abdomen is soft.     Tenderness: There is no abdominal tenderness.  Genitourinary:    Rectum: Guaiac result negative.  Musculoskeletal:        General: Tenderness (lower back and neck tenderness) present. No swelling. Normal range of motion.     Cervical back: Full passive range of motion without pain, normal range of motion and neck supple.     Right lower leg: No edema.     Left lower leg: No edema.  Skin:    General: Skin is warm and dry.     Capillary Refill: Capillary refill takes less than 2 seconds.  Neurological:  General: No focal deficit present.     Mental Status: She is alert and oriented to person, place, and time.     Cranial Nerves: No cranial nerve deficit.     Sensory: No sensory deficit.     Motor: No weakness.  Psychiatric:        Mood and Affect: Mood normal.        Behavior: Behavior normal.        Thought Content: Thought content normal.        Judgment: Judgment normal.        Assessment And Plan:    1. Encounter for  general adult medical examination w/o abnormal findings Behavior modifications discussed and diet history reviewed.   Pt will continue to exercise regularly and modify diet with low GI, plant based foods and decrease intake of processed foods.  Recommend intake of daily multivitamin, Vitamin D, and calcium.  Recommend for preventive screenings, as well as recommend immunizations that include influenza, TDAP, and Shingles - CMP14+EGFR - CBC  2. Essential hypertension Blood pressure is well controlled Continue current medications - POCT Urinalysis Dipstick (81002) - POCT UA - Microalbumin - EKG 12-Lead  3. Uncontrolled type 2 diabetes mellitus with hyperglycemia ([place]) She is to continue current medications Continue avoiding sugary foods and drinks - Hemoglobin A1c - Lipid panel - Ambulatory referral to Endocrinology  4. Encounter for immunization She was given shingrix today - Varicella-zoster vaccine IM (Shingrix)  5. Shortness of breath No abnormal findings with physical exam I have provided her with a rescue inhaler - albuterol (VENTOLIN HFA) 108 (90 Base) MCG/ACT inhaler; Inhale 2 puffs into the lungs every 6 (six) hours as needed for wheezing or shortness of breath.  Dispense: 18 g; Refill: 2  6. Impacted cerumen of left ear Firm cerumen to left ear, good results - Ear Lavage  7. Acute nonintractable headache, unspecified headache type Will treat with toradol, may be related to tension, she also has some neck discomfort - ketorolac (TORADOL) 30 MG/ML injection 30 mg      Patient was given opportunity to ask questions. Patient verbalized understanding of the plan and was able to repeat key elements of the plan. All questions were answered to their satisfaction.  [name], FNP   I, [name], FNP, have reviewed all documentation for this visit. The documentation on 07/02/21 for the exam, diagnosis, procedures, and orders are all accurate and complete.  THE  PATIENT IS ENCOURAGED TO PRACTICE SOCIAL DISTANCING DUE TO THE COVID-19 PANDEMIC.

## 2021-06-30 ENCOUNTER — Other Ambulatory Visit: Payer: Self-pay

## 2021-06-30 ENCOUNTER — Ambulatory Visit
Admission: EM | Admit: 2021-06-30 | Discharge: 2021-06-30 | Disposition: A | Discharge status: Home / Self Care | Payer: Medicaid Other | Attending: Emergency Medicine | Admitting: Emergency Medicine

## 2021-06-30 DIAGNOSIS — G44221 Chronic tension-type headache, intractable: Secondary | ICD-10-CM | POA: Diagnosis not present

## 2021-06-30 DIAGNOSIS — Z8669 Personal history of other diseases of the nervous system and sense organs: Secondary | ICD-10-CM | POA: Diagnosis not present

## 2021-06-30 DIAGNOSIS — M62838 Other muscle spasm: Secondary | ICD-10-CM

## 2021-06-30 MED ORDER — PROMETHAZINE HCL 25 MG PO TABS: 25.0000 mg | ORAL_TABLET | Freq: Three times a day (TID) | ORAL | 0 refills | Status: DC | PRN

## 2021-06-30 MED ORDER — SUMATRIPTAN SUCCINATE 6 MG/0.5ML ~~LOC~~ SOLN
6.0000 mg | Freq: Once | SUBCUTANEOUS | Status: AC
  Administered 2021-06-30: 6 mg via SUBCUTANEOUS

## 2021-06-30 MED ORDER — METHYLPREDNISOLONE SODIUM SUCC 125 MG IJ SOLR
125.0000 mg | Freq: Once | INTRAMUSCULAR | Status: AC
  Administered 2021-06-30: 125 mg via INTRAMUSCULAR

## 2021-06-30 MED ORDER — METHYLPREDNISOLONE 4 MG PO TBPK: ORAL_TABLET | ORAL | 0 refills | Status: DC

## 2021-06-30 MED ORDER — BACLOFEN 10 MG PO TABS: 10.0000 mg | ORAL_TABLET | Freq: Every day | ORAL | 0 refills | Status: AC
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
## 2021-06-30 NOTE — ED Triage Notes (Signed)
Pt reports headache for about two weeks, as well as neck pain. Pt does report light makes pain worse.   Pt reports that no medications are helping with headache.

## 2021-06-30 NOTE — Discharge Instructions (Addendum)
Thank you for coming to urgent care today.  Given the duration of your headache, I feel it is appropriate that you received an injection of steroid along with a follow-up medrol Dosepak to help resolve the inflammation that is most likely leading to your headache.    Because you have a history of migraine, even though you were only 65 years old at this time, I also feel it is appropriate to treat you with a antimigraine medication called Imitrex, this injection was provided to you as well.    Additionally, it would be a good idea to treat your nausea with some antinausea medication, promethazine, you can take 1 at bedtime tonight and repeat tomorrow 3 times daily as needed if you are still feeling nauseated.    Finally, I prescribed an muscle relaxer for you to address the tension this in your neck.  You can take 1 at bedtime for the next 3 days, this will also help you sleep and should make you feel very relaxed.    As we discussed, tomorrow if you do not have any meaningful improvement for over the next 5 days if you do not have a complete resolution of her headache, please feel free to follow-up with [place] or to go to primary or urgent care for further evaluation and treatment.  It is my opinion that after trying all of these remedies and having none of them work, it definitely would be appropriate to have imaging done of your head.

## 2021-07-02 ENCOUNTER — Encounter: Payer: Self-pay | Admitting: Nurse Practitioner

## 2021-07-13 ENCOUNTER — Other Ambulatory Visit: Payer: Self-pay | Admitting: Nurse Practitioner

## 2021-07-13 ENCOUNTER — Other Ambulatory Visit: Payer: Self-pay

## 2021-07-13 ENCOUNTER — Ambulatory Visit: Payer: Medicaid Other

## 2021-07-13 MED ORDER — ACCU-CHEK GUIDE VI STRP: ORAL_STRIP | 2 refills | Status: DC

## 2021-07-13 MED ORDER — ACCU-CHEK FASTCLIX LANCETS MISC: 99 refills | Status: DC

## 2021-07-13 MED ORDER — ACCU-CHEK NANO SMARTVIEW W/DEVICE KIT: PACK | 0 refills | Status: DC

## 2021-07-15 ENCOUNTER — Encounter: Payer: Self-pay | Admitting: Nurse Practitioner

## 2021-07-20 ENCOUNTER — Other Ambulatory Visit: Payer: Self-pay

## 2021-07-20 ENCOUNTER — Emergency Department (HOSPITAL_COMMUNITY)
Admission: EM | Admit: 2021-07-20 | Discharge: 2021-07-21 | Disposition: A | Discharge status: Home / Self Care | Payer: Medicaid Other | Attending: Physician Assistant | Admitting: Physician Assistant

## 2021-07-20 ENCOUNTER — Emergency Department (HOSPITAL_COMMUNITY): Payer: Medicaid Other

## 2021-07-20 ENCOUNTER — Ambulatory Visit
Admission: EM | Admit: 2021-07-20 | Discharge: 2021-07-20 | Disposition: A | Discharge status: Home / Self Care | Payer: Medicaid Other

## 2021-07-20 ENCOUNTER — Encounter (HOSPITAL_COMMUNITY): Payer: Self-pay | Admitting: Emergency Medicine

## 2021-07-20 DIAGNOSIS — Z5321 Procedure and treatment not carried out due to patient leaving prior to being seen by health care provider: Secondary | ICD-10-CM | POA: Insufficient documentation

## 2021-07-20 DIAGNOSIS — M25511 Pain in right shoulder: Secondary | ICD-10-CM | POA: Insufficient documentation

## 2021-07-20 DIAGNOSIS — R29898 Other symptoms and signs involving the musculoskeletal system: Secondary | ICD-10-CM | POA: Diagnosis not present

## 2021-07-20 DIAGNOSIS — R519 Headache, unspecified: Secondary | ICD-10-CM | POA: Diagnosis present

## 2021-07-20 LAB — CBC WITH DIFFERENTIAL/PLATELET
Abs Immature Granulocytes: 0.03 10*3/uL (ref 0.00–0.07)
Basophils Absolute: 0 10*3/uL (ref 0.0–0.1)
Basophils Relative: 1 %
Eosinophils Absolute: 0.1 10*3/uL (ref 0.0–0.5)
Eosinophils Relative: 2 %
HCT: 35.8 % — ABNORMAL LOW (ref 36.0–46.0)
Hemoglobin: 11.5 g/dL — ABNORMAL LOW (ref 12.0–15.0)
Immature Granulocytes: 0 %
Lymphocytes Relative: 26 %
Lymphs Abs: 2.1 10*3/uL (ref 0.7–4.0)
MCH: 24.7 pg — ABNORMAL LOW (ref 26.0–34.0)
MCHC: 32.1 g/dL (ref 30.0–36.0)
MCV: 77 fL — ABNORMAL LOW (ref 80.0–100.0)
Monocytes Absolute: 0.3 10*3/uL (ref 0.1–1.0)
Monocytes Relative: 3 %
Neutro Abs: 5.8 10*3/uL (ref 1.7–7.7)
Neutrophils Relative %: 68 %
Platelets: 173 10*3/uL (ref 150–400)
RBC: 4.65 MIL/uL (ref 3.87–5.11)
RDW: 14.5 % (ref 11.5–15.5)
WBC: 8.4 10*3/uL (ref 4.0–10.5)
nRBC: 0 % (ref 0.0–0.2)

## 2021-07-20 LAB — BASIC METABOLIC PANEL
Anion gap: 10 (ref 5–15)
BUN: 8 mg/dL (ref 8–23)
CO2: 20 mmol/L — ABNORMAL LOW (ref 22–32)
Calcium: 9 mg/dL (ref 8.9–10.3)
Chloride: 106 mmol/L (ref 98–111)
Creatinine, Ser: 1.18 mg/dL — ABNORMAL HIGH (ref 0.44–1.00)
GFR, Estimated: 52 mL/min — ABNORMAL LOW (ref >60)
Glucose, Bld: 109 mg/dL — ABNORMAL HIGH (ref 70–99)
Potassium: 4.2 mmol/L (ref 3.5–5.1)
Sodium: 136 mmol/L (ref 135–145)

## 2021-07-20 MED ORDER — ACETAMINOPHEN 325 MG PO TABS
650.0000 mg | ORAL_TABLET | Freq: Once | ORAL | Status: AC
  Administered 2021-07-20: 650 mg via ORAL
  Filled 2021-07-20: qty 2

## 2021-07-20 MED ORDER — ACCU-CHEK GUIDE ME W/DEVICE KIT: PACK | 1 refills | Status: DC

## 2021-07-20 MED ORDER — ACCU-CHEK GUIDE VI STRP: ORAL_STRIP | 2 refills | Status: DC

## 2021-07-20 MED ORDER — ACCU-CHEK SOFTCLIX LANCETS MISC: 2 refills | Status: DC

## 2021-07-20 NOTE — ED Provider Notes (Signed)
Emergency Medicine Provider Triage Evaluation Note  Kara Kelley , a 65 y.o. female  was evaluated in triage.  Pt complains of headache and rue pain/weakness. Also c/o neck pain. Recently tx with steroids with no relief.  Review of Systems  Positive: Headache, neck pain, rue pain/weakness Negative: Chest pain  Physical Exam  BP (!) 144/79   Pulse 83   Temp 98.6 F (37 C) (Oral)   Resp 18   Ht 4\' 11"  (1.499 m)   Wt 85 kg   SpO2 98%   BMI 37.85 kg/m  Gen:   Awake, no distress   Resp:  Normal effort  MSK:   Moves extremities without difficulty  Other:  Ttp over the right trapezius, decreased sensation to the rue compared to the left. No facial droop, clear speech. 5/5 strength to the ble  Medical Decision Making  Medically screening exam initiated at 8:32 PM.  Appropriate orders placed.  Kara Kelley was informed that the remainder of the evaluation will be completed by another provider, this initial triage assessment does not replace that evaluation, and the importance of remaining in the ED until their evaluation is complete.     Loma Sousa 07/20/21 2034    2035, MD 07/20/21 2207

## 2021-07-20 NOTE — ED Provider Notes (Signed)
Asked by staff to reevaluate the patient due to worsening pain.  The patient presents from urgent care for worsening headache.  Headache began posteriorly, but she has now endorsing a generalized headache.  She has been having pain radiating down her right arm and weakness.  She has some paresthesias in all 5 fingers on the right hand earlier today, but these have since resolved.  She does intermittently endorse worsening pain when she lifts her right arm.  She has had some gait instability, but reports that this has been ongoing for many months.  She also reports that she has been having difficulty with gripping objects in her right hand, but this is also been going on for many months.  The symptoms have not worsened over the last few days.  On my exam, she has no ataxia with ambulation.  Sensation is intact and equal with light touch to the upper and lower extremities.  She does have 3/5 weakness to the right arm as compared to the left arm.  5 out of 5 strength against resistance to the bilateral legs.  Will order Tylenol.  She will require further work-up and evaluation in the emergency department.   Frederik Pear A, PA-C 07/21/21 0000    Gilda Crease, MD 07/21/21 [phone]
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
## 2021-07-20 NOTE — ED Triage Notes (Signed)
Patient reports headache for 4 weeks , denies head injury , no emesis or photophobia , patient added right shoulder pain for 3 days .

## 2021-07-20 NOTE — ED Triage Notes (Signed)
Pt c/o headache onset a couple of weeks ago. States she was seen by urgent care and prescribed what was described as a tapering regime. States the medication was helpful but once she completed the course her headaches returned without relief using OTC medications.

## 2021-07-21 NOTE — ED Notes (Signed)
Pt. Left AMA  

## 2021-07-22 LAB — CBC AND DIFFERENTIAL
HCT: 31 — AB (ref 36–46)
Hemoglobin: 10.4 — AB (ref 12.0–16.0)
Platelets: 168 (ref 150–399)
WBC: 5.5

## 2021-07-22 LAB — CBC: RBC: 4.19 (ref 3.87–5.11)

## 2021-07-23 ENCOUNTER — Telehealth: Payer: Self-pay

## 2021-07-23 ENCOUNTER — Other Ambulatory Visit: Payer: Self-pay | Admitting: Gastroenterology

## 2021-07-23 DIAGNOSIS — R110 Nausea: Secondary | ICD-10-CM

## 2021-07-26 ENCOUNTER — Other Ambulatory Visit: Payer: Self-pay

## 2021-07-26 ENCOUNTER — Encounter (HOSPITAL_COMMUNITY): Payer: Self-pay

## 2021-07-26 ENCOUNTER — Emergency Department (HOSPITAL_COMMUNITY)
Admission: EM | Admit: 2021-07-26 | Discharge: 2021-07-27 | Disposition: A | Discharge status: Home / Self Care | Payer: Medicaid Other | Attending: Emergency Medicine | Admitting: Emergency Medicine

## 2021-07-26 DIAGNOSIS — M5412 Radiculopathy, cervical region: Secondary | ICD-10-CM | POA: Insufficient documentation

## 2021-07-26 DIAGNOSIS — E119 Type 2 diabetes mellitus without complications: Secondary | ICD-10-CM | POA: Insufficient documentation

## 2021-07-26 DIAGNOSIS — J45909 Unspecified asthma, uncomplicated: Secondary | ICD-10-CM | POA: Diagnosis not present

## 2021-07-26 DIAGNOSIS — J449 Chronic obstructive pulmonary disease, unspecified: Secondary | ICD-10-CM | POA: Diagnosis not present

## 2021-07-26 DIAGNOSIS — Z79899 Other long term (current) drug therapy: Secondary | ICD-10-CM | POA: Diagnosis not present

## 2021-07-26 DIAGNOSIS — Z794 Long term (current) use of insulin: Secondary | ICD-10-CM | POA: Insufficient documentation

## 2021-07-26 DIAGNOSIS — I10 Essential (primary) hypertension: Secondary | ICD-10-CM | POA: Diagnosis not present

## 2021-07-26 DIAGNOSIS — Z87891 Personal history of nicotine dependence: Secondary | ICD-10-CM | POA: Diagnosis not present

## 2021-07-26 DIAGNOSIS — R519 Headache, unspecified: Secondary | ICD-10-CM | POA: Diagnosis present

## 2021-07-26 NOTE — ED Triage Notes (Signed)
Patient c/o headache, right neck that radiates into the right shoulder x 2 weeks. Patient states she has slight blurred vision, nausea, loud noises, and sensitivity to light

## 2021-07-26 NOTE — ED Provider Notes (Signed)
Emergency Medicine Provider Triage Evaluation Note  Kara Kelley , a 65 y.o. female  was evaluated in triage.  Pt complains of headaches for the past month.  Patient has been seen multiple times and diagnosed with migraines.  Went to [facility] a week ago but left without being seen.  Review of Systems  Positive: Headaches Negative: Vomiting  Physical Exam  BP (!) 155/84 (BP Location: Right Arm)   Pulse 80   Temp 98.8 F (37.1 C) (Oral)   Resp 16   SpO2 99%  Gen:   Awake, no distress   Resp:  Normal effort  MSK:   Moves extremities without difficulty  Other:    Medical Decision Making  Medically screening exam initiated at 6:46 PM.  Appropriate orders placed.  Kara Kelley was informed that the remainder of the evaluation will be completed by another provider, this initial triage assessment does not replace that evaluation, and the importance of remaining in the ED until their evaluation is complete.    Had a negative CT 6 days ago   Saddie Benders, PA-C 07/26/21 1847    Pricilla Loveless, MD 07/28/21 [phone]

## 2021-07-27 MED ORDER — OXYCODONE-ACETAMINOPHEN 5-325 MG PO TABS
2.0000 | ORAL_TABLET | Freq: Once | ORAL | Status: AC
  Administered 2021-07-27: 2 via ORAL
  Filled 2021-07-27: qty 2

## 2021-07-27 MED ORDER — OXYCODONE-ACETAMINOPHEN 5-325 MG PO TABS: 1.0000 | ORAL_TABLET | ORAL | 0 refills | Status: DC | PRN

## 2021-07-27 NOTE — ED Provider Notes (Signed)
[facility] DEPT Provider Note: Georgena Spurling, MD, FACEP  CSN: [id number] MRN: [id number] ARRIVAL: 07/26/21 at [place]  Headache   HISTORY OF PRESENT ILLNESS  07/27/21 3:31 AM Kara Kelley is a 65 y.o. female who was previously on pain management but has not had any narcotic prescriptions since January of this year.  This is for back pain which is improved status post 2 operations by Dr. Annette Stable of neurosurgery.  The patient is here with about 3 weeks of pain in her head and neck.  The pain is located on the right side of her neck and radiates to her right occipital area as well as down to her right arm.  It is worse with movement of her neck.  She did have a CT scan of the head and cervical spine on 07/20/2021 that showed no acute intracranial abnormalities but diffuse degenerative changes of the cervical spine.  At that time she was having some weakness of the right upper extremity which has improved.  She rates her pain as a 10 out of 10, pounding in nature as well as having a sharp component.   Past Medical History:  Diagnosis Date   Anemia    Anxiety    Arthritis    Asthma    Bunion    Callus    Chronic pain    Cocaine abuse ([place])    COPD (chronic obstructive pulmonary disease) ([facility])    Corns and callosities    Degenerative joint disease    Depression    Diabetes mellitus    Endometrial polyp    ETOH abuse    Gall stones    GERD (gastroesophageal reflux disease)    Headache    history of Migraines   Hepatitis C    Hep C   Hyperlipidemia    Hypertension    Insomnia    Spondylolisthesis of lumbar region    Substance abuse ([place])    alcoholism   Tuberculosis 1985   Wears dentures    Wears glasses     Past Surgical History:  Procedure Laterality Date   BACK SURGERY  2018   CESAREAN SECTION     x3   CHOLECYSTECTOMY N/A 08/11/2016   Procedure: LAPAROSCOPIC CHOLECYSTECTOMY WITH   INTRAOPERATIVE CHOLANGIOGRAM;  Surgeon: Stark Klein, MD;  Location: [facility];  Service: General;  Laterality: N/A;   COLONOSCOPY     Cotton Osteotomy w/ Graft Left 06/18/2009   Excision of Benign Lesion Right 01/30/2013   Rt Plantar   FOOT SURGERY     HAMMER TOE REPAIR Right 02/12/2016   RIGHT #5   Hammertoe Repair Left 06/18/2009   Lt #5   HYSTEROSCOPY N/A 06/20/2017   Procedure: DILATION AND CURETTAGE, HYSTEROSCOPY w/ Polypectomy;  Surgeon: Lavonia Drafts, MD;  Location: [facility];  Service: Gynecology;  Laterality: N/A;   LUMBAR FUSION  2018   METATARSAL OSTEOTOMY Left 06/18/2009   #5   MULTIPLE TOOTH EXTRACTIONS     Nail Matrixectomy Left 06/18/2009   LT #1   OSTEOTOMY Right 01/30/2013   Rt #5   Phalangectomy Left 06/18/2009   LT #1   Phalangectomy Right 01/30/2013   Rt #1   TUBAL LIGATION      Family History  Problem Relation Age of Onset   Heart disease Mother    Diabetes Other        mat great aunt   Cirrhosis Other        mat  great aunt   Cirrhosis Other        mat great uncles x 2   Colon cancer Neg Hx    Breast cancer Neg Hx     Social History   Tobacco Use   Smoking status: Former    Packs/day: 0.50    Types: Cigarettes    Quit date: 2020    Years since quitting: 2.7   Smokeless tobacco: Never  Vaping Use   Vaping Use: Never used  Substance Use Topics   Alcohol use: No    Alcohol/week: 0.0 standard drinks    Comment: in rehab   Drug use: No    Comment: in rehab    Prior to Admission medications   Medication Sig Start Date End Date Taking? Authorizing Provider  oxyCODONE-acetaminophen (PERCOCET) 5-325 MG tablet Take 1 tablet by mouth every 4 (four) hours as needed. 07/27/21  Yes Leafy Motsinger, MD  Accu-Chek FastClix Lancets MISC Use as directed to check blood sugars 2 times per day dx: e11.65 07/13/21   [name], FNP  Accu-Chek Softclix Lancets lancets Use as instructed to check blood sugars twice daily E11.69 07/20/21   [name], FNP  albuterol (VENTOLIN HFA) 108 (90 Base) MCG/ACT  inhaler Inhale 2 puffs into the lungs every 6 (six) hours as needed for wheezing or shortness of breath. 06/29/21   [name], FNP  ARIPiprazole (ABILIFY) 10 MG tablet Take 10 mg by mouth daily.  03/26/19   [provider]  BD PEN NEEDLE NANO 2ND GEN 32G X 4 MM MISC USE THREE TIMES DAILY AS DIRECTED 05/06/20   [name], FNP  Blood Glucose Monitoring Suppl (ACCU-CHEK GUIDE ME) w/Device KIT Use to check blood sugars twice daily E11.69 07/20/21   [name], FNP  Blood Glucose Monitoring Suppl (ACCU-CHEK NANO SMARTVIEW) w/Device KIT Use as directed to check blood sugars 2 times per day dx: e11.65 07/13/21   [name], FNP  Cholecalciferol (VITAMIN D) 50 MCG (2000 UT) CAPS Take 2,000 Units by mouth daily.    [provider]  cyclobenzaprine (FLEXERIL) 5 MG tablet TAKE 1 TABLET(5 MG) BY MOUTH THREE TIMES DAILY AS NEEDED FOR MUSCLE SPASMS 06/15/21   [name], FNP  gabapentin (NEURONTIN) 300 MG capsule TAKE 1 CAPSULE(300 MG) BY MOUTH THREE TIMES DAILY AS NEEDED FOR PAIN 03/29/21   [name], FNP  Glucagon (GVOKE HYPOPEN 2-PACK) 1 MG/0.2ML SOAJ Inject 1 each into the skin as needed. If blood sugar less than 60 11/26/20   [name], FNP  glucose blood (ACCU-CHEK GUIDE) test strip Use as directed to check blood sugars 2 times per day dx: e11.69 07/20/21   [name], FNP  insulin detemir (LEVEMIR FLEXTOUCH) 100 UNIT/ML FlexPen Inject 30 Units into the skin at bedtime. 01/27/21   [name], FNP  LINZESS 145 MCG CAPS capsule Take 145 mcg by mouth daily before breakfast.  06/25/19   [provider]  lisinopril (ZESTRIL) 10 MG tablet TAKE 1 TABLET(10 MG) BY MOUTH DAILY 05/20/21   [name], FNP  methylPREDNISolone (MEDROL DOSEPAK) 4 MG TBPK tablet Take 1 row of tablets daily until complete. 06/30/21   Lynden Oxford Scales, PA-C  NOVOLOG FLEXPEN 100 UNIT/ML FlexPen Inject 18 Units into the skin 3 (three) times daily with meals. 01/27/21   [name], FNP   omeprazole (PRILOSEC) 40 MG capsule TAKE 1 CAPSULE(40 MG) BY MOUTH DAILY BEFORE BREAKFAST 05/17/21   [name], FNP  ondansetron (ZOFRAN) 4 MG tablet Take 1 tablet (4 mg  total) by mouth 3 (three) times daily as needed for nausea or vomiting. 04/06/21   [name], FNP  promethazine (PHENERGAN) 25 MG tablet Take 1 tablet (25 mg total) by mouth every 8 (eight) hours as needed for up to 3 days for nausea or vomiting. 06/30/21 07/03/21  Lynden Oxford Scales, PA-C  Semaglutide, 1 MG/DOSE, (OZEMPIC, 1 MG/DOSE,) 4 MG/3ML SOPN Inject 1 mg into the skin once a week. 05/12/21   [name], FNP  spironolactone (ALDACTONE) 50 MG tablet TAKE 1 TABLET(50 MG) BY MOUTH DAILY 04/20/21   [name], FNP  traZODone (DESYREL) 150 MG tablet Take 150 mg by mouth at bedtime.  07/16/19   [provider]    Allergies Patient has no known allergies.   REVIEW OF SYSTEMS  Negative except as noted here or in the History of Present Illness.   PHYSICAL EXAMINATION  Initial Vital Signs Blood pressure (!) 141/107, pulse 89, temperature 98.3 F (36.8 C), temperature source Oral, resp. rate 18, height _0  (1.499 m), weight 90.3 kg, SpO2 99 %.  Examination General: Well-developed, well-nourished female in no acute distress; appearance consistent with age of record HENT: normocephalic; atraumatic Eyes: Normal appearance Neck: supple; movement of the neck to the left reproduces the patient's pain Heart: regular rate and rhythm Lungs: clear to auscultation bilaterally Abdomen: soft; nondistended; nontender; bowel sounds present Extremities: No deformity; full range of motion; pulses normal Neurologic: Awake, alert and oriented; motor function intact in all extremities and symmetric; no facial droop Skin: Warm and dry Psychiatric: Normal mood and affect   RESULTS  Summary of this visit's results, reviewed and interpreted by myself:   EKG Interpretation  Date/Time:    Ventricular Rate:    PR  Interval:    QRS Duration:   QT Interval:    QTC Calculation:   R Axis:     Text Interpretation:         Laboratory Studies: No results found for this or any previous visit (from the past 24 hour(s)). Imaging Studies: No results found.  ED COURSE and MDM  Nursing notes, initial and subsequent vitals signs, including pulse oximetry, reviewed and interpreted by myself.  Vitals:   07/26/21 1842 07/26/21 1853 07/27/21 0250  BP: (!) 155/84  (!) 141/107  Pulse: 80  89  Resp: 16  18  Temp: 98.8 F (37.1 C)  98.3 F (36.8 C)  TempSrc: Oral  Oral  SpO2: 99%  99%  Weight:  90.3 kg   Height:  _1  (1.499 m)    Medications  oxyCODONE-acetaminophen (PERCOCET/ROXICET) 5-325 MG per tablet 2 tablet (has no administration in time range)    The patient's presentation is most consistent with cervical radiculopathy.  It is likely contributing to some occipital neuralgia affecting her scalp explaining why she is perceiving her pain as a headache.  The fact that the head and neck pain change with movement of the neck points more to the cervical etiology.  Her presentation is not consistent with migraines.  A stroke would not be expected to cause this pain.  We will refer back to Dr. Trenton Gammon for further evaluation as I suspect an MRI will be indicated.  PROCEDURES  Procedures   ED DIAGNOSES     ICD-10-CM   1. Cervical radiculopathy  M54.12          Rendell Thivierge, Jenny Reichmann, MD 07/27/21 0345

## 2021-07-27 NOTE — ED Notes (Signed)
Patient has no concerns after AVS has been reviewed and patient education provided. Patient discharged. 

## 2021-07-27 NOTE — ED Notes (Signed)
Pt refuse updated vitals

## 2021-07-28 ENCOUNTER — Other Ambulatory Visit: Payer: Self-pay | Admitting: Nurse Practitioner

## 2021-07-28 ENCOUNTER — Telehealth: Payer: Self-pay

## 2021-07-28 DIAGNOSIS — I10 Essential (primary) hypertension: Secondary | ICD-10-CM

## 2021-07-28 NOTE — Telephone Encounter (Signed)
Left pt vm to give the office a call back. Provider wants to know of pts orthopedic & if she has followed up after visiting ED on 07/26/2021.

## 2021-07-28 NOTE — Telephone Encounter (Signed)
Error

## 2021-07-29 ENCOUNTER — Ambulatory Visit
Admission: RE | Admit: 2021-07-29 | Discharge: 2021-07-29 | Disposition: A | Discharge status: Home / Self Care | Payer: Medicaid Other | Source: Ambulatory Visit | Attending: Gastroenterology | Admitting: Gastroenterology

## 2021-07-29 DIAGNOSIS — R110 Nausea: Secondary | ICD-10-CM

## 2021-08-05 ENCOUNTER — Other Ambulatory Visit: Payer: Self-pay | Admitting: Nurse Practitioner

## 2021-08-09 ENCOUNTER — Encounter: Payer: Self-pay | Admitting: Nurse Practitioner

## 2021-08-25 ENCOUNTER — Other Ambulatory Visit: Payer: Self-pay

## 2021-08-25 MED ORDER — OZEMPIC (1 MG/DOSE) 4 MG/3ML ~~LOC~~ SOPN: 1.0000 mg | PEN_INJECTOR | SUBCUTANEOUS | 0 refills | Status: DC

## 2021-09-09 NOTE — Progress Notes (Signed)
Erroneous encounter

## 2021-09-13 ENCOUNTER — Encounter: Payer: Medicaid Other | Admitting: Family

## 2021-09-13 DIAGNOSIS — Z7689 Persons encountering health services in other specified circumstances: Secondary | ICD-10-CM

## 2021-09-14 IMAGING — CR DG LUMBAR SPINE COMPLETE 4+V
5 series · 5 of 5 positions shown · non-contrast
Comparison: February 06, 2020

CLINICAL DATA: Pain following fall

EXAM:
LUMBAR SPINE - COMPLETE 4+ VIEW

[t lumbar spine ap]
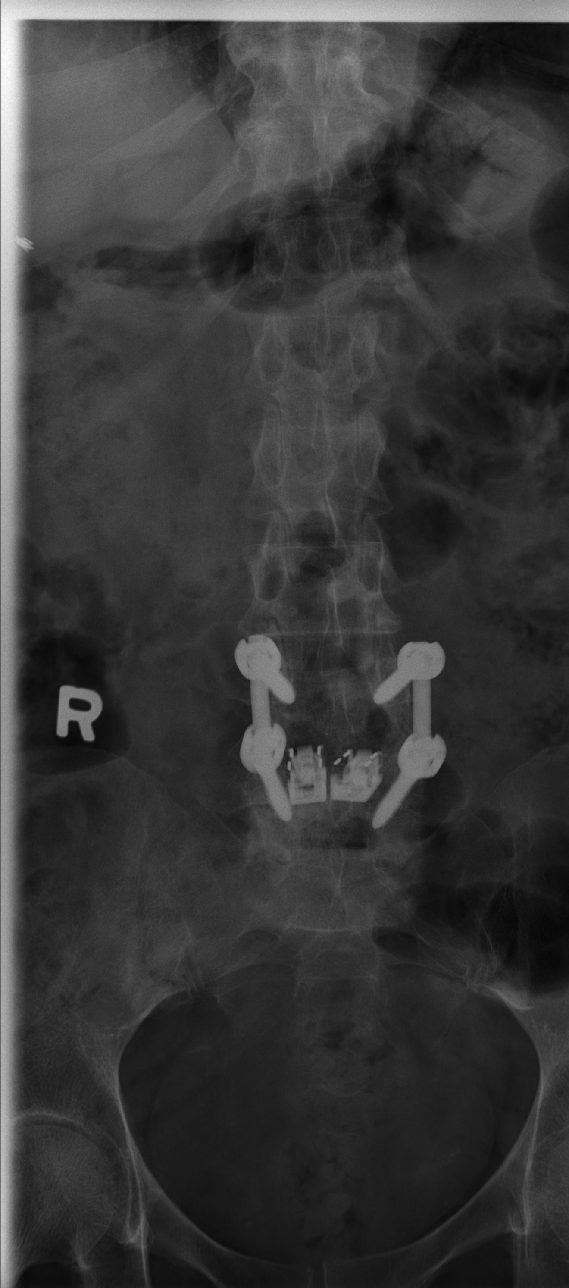

[t lumbar spine obl (1 of 2)]
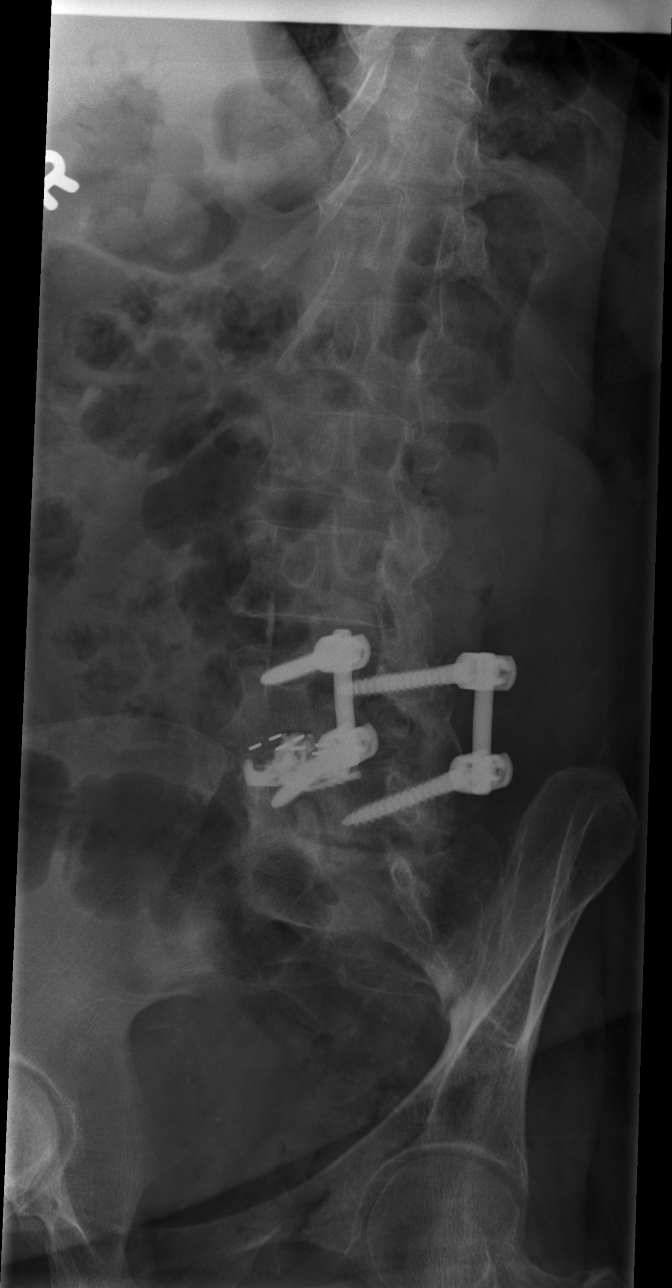

[t lumbar spine obl (2 of 2)]
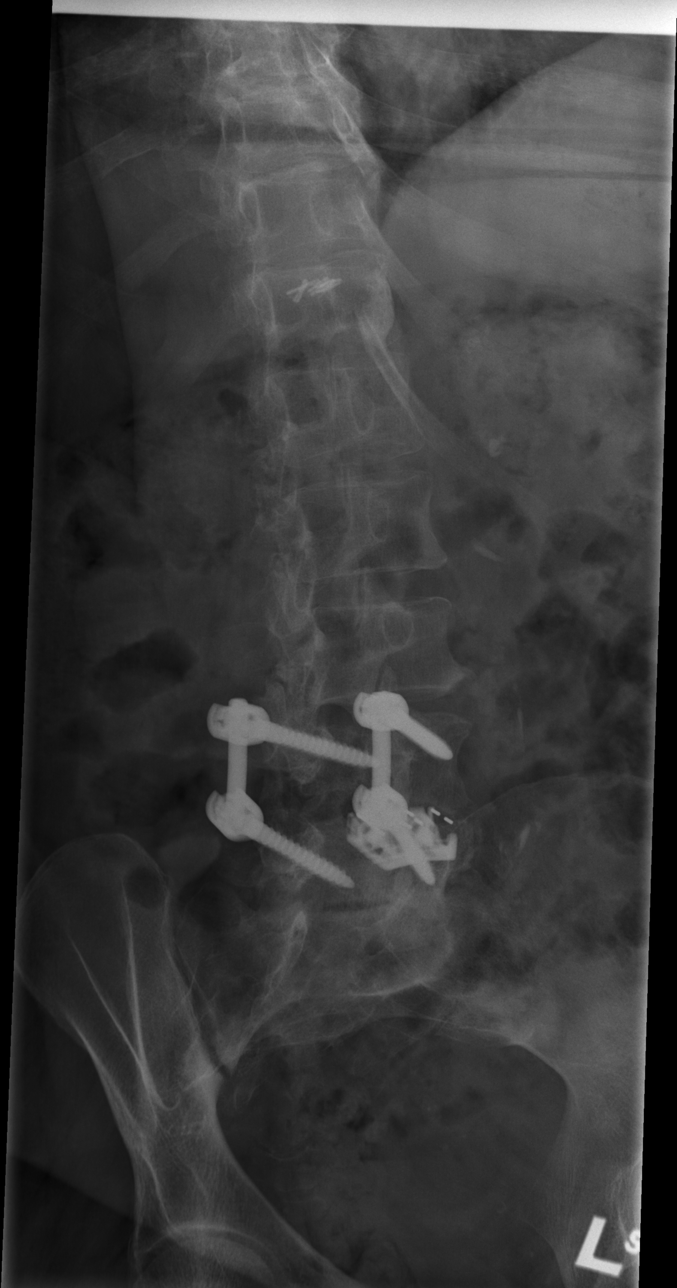

[t lumbar spine lat]
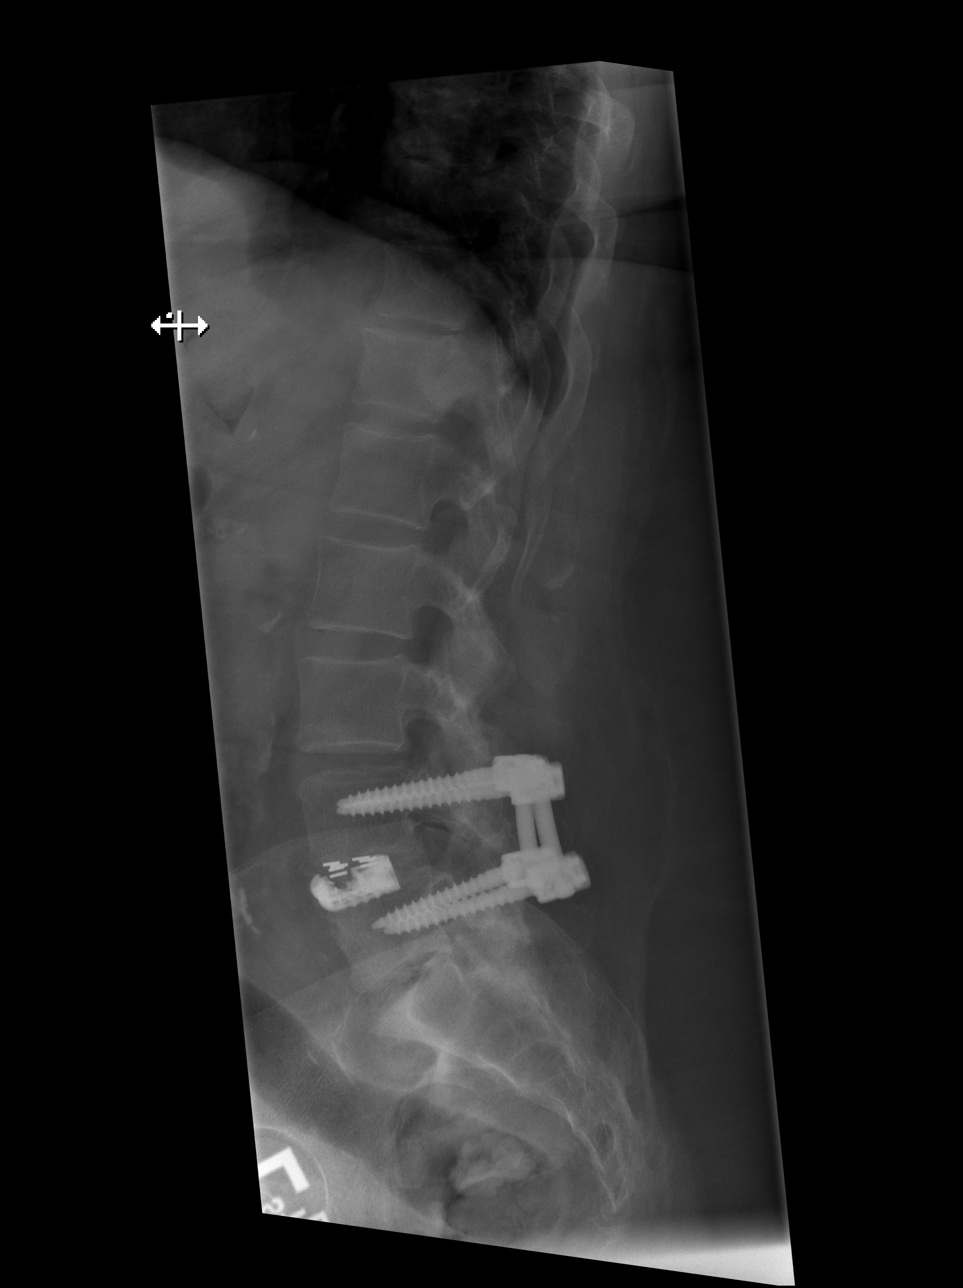

[t lumbar l-5 s-1 spot]
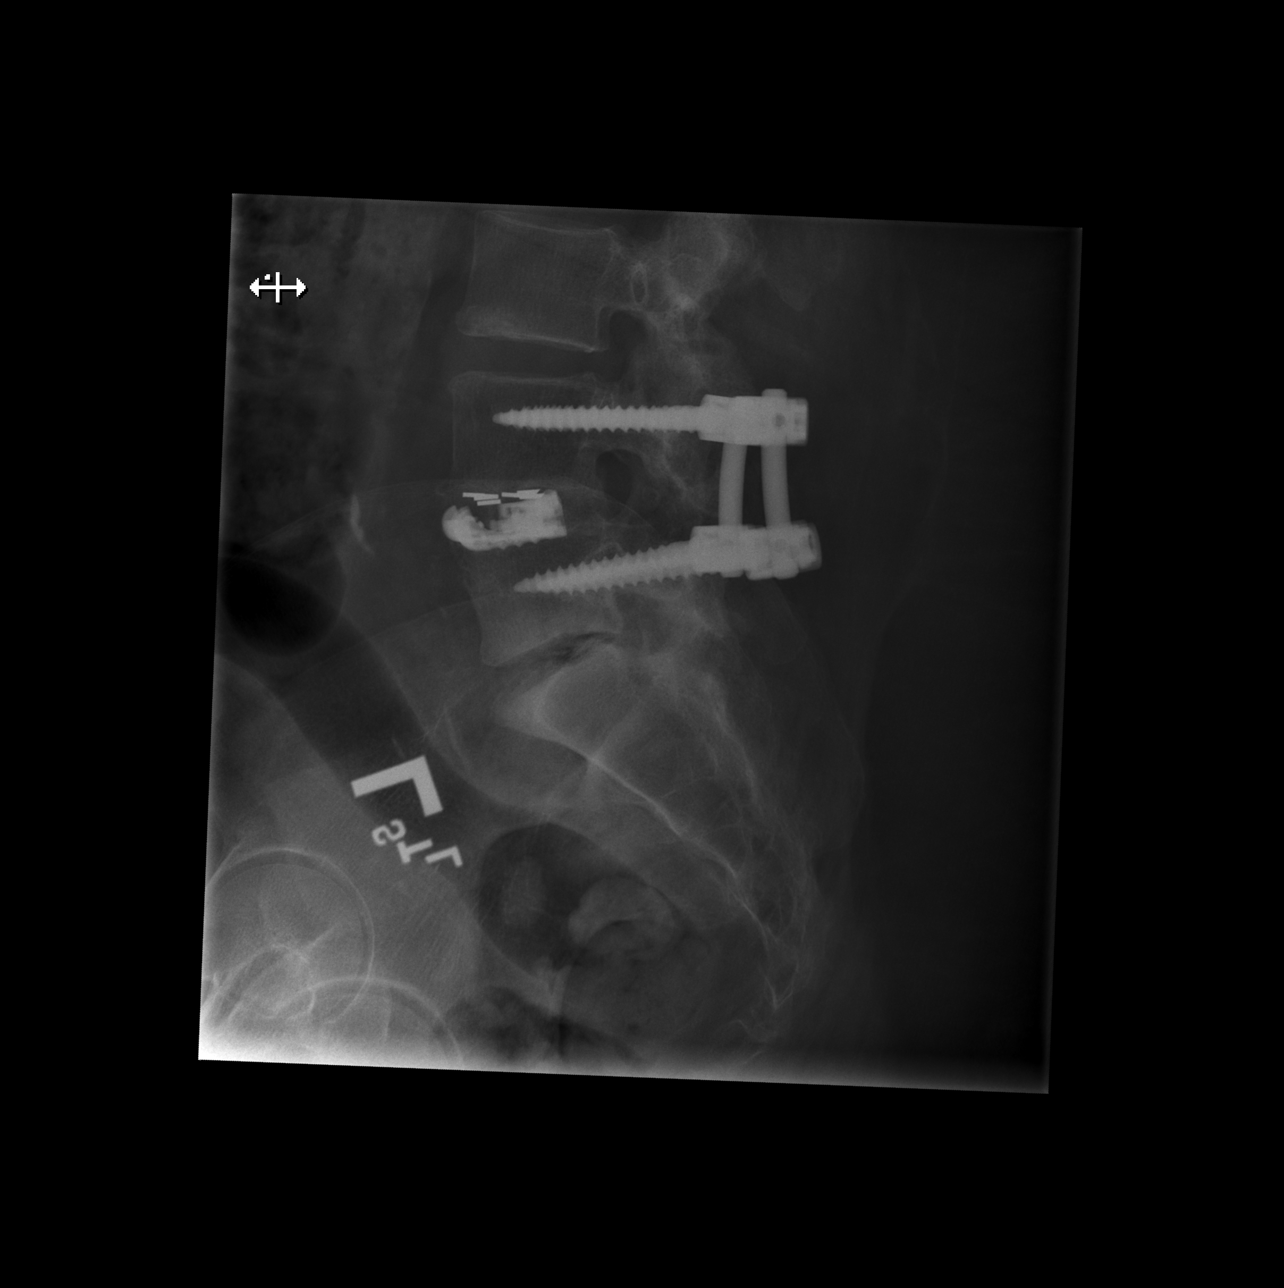

[5 of 5 positions shown; findings below may reference images not displayed]

FINDINGS: Frontal, lateral, spot lumbosacral lateral, and bilateral oblique
views were obtained. There are 5 non-rib-bearing lumbar type
vertebral bodies. There is postoperative posterior screw and plate
fixation at L4 and L5 with a disc spacer at L4-5. Support hardware
intact. No fracture or spondylolisthesis. There is moderate disc
space narrowing at L5-S1. There is slight disc space narrowing at
L3-4. Other disc spaces appear unremarkable with disc spacer noted
at L4-5. There is no appreciable facet arthropathy.
IMPRESSION: Postoperative changes at L4 and L5 with support hardware intact. No
fracture or spondylolisthesis. Foci of disc space narrowing at L3-4
and L5-S1, similar to prior study.

## 2021-09-20 ENCOUNTER — Encounter (HOSPITAL_COMMUNITY): Payer: Self-pay

## 2021-09-20 ENCOUNTER — Other Ambulatory Visit: Payer: Self-pay

## 2021-09-20 ENCOUNTER — Emergency Department (HOSPITAL_COMMUNITY): Payer: Medicaid Other

## 2021-09-20 ENCOUNTER — Emergency Department (HOSPITAL_COMMUNITY)
Admission: EM | Admit: 2021-09-20 | Discharge: 2021-09-21 | Disposition: A | Discharge status: Home / Self Care | Payer: Medicaid Other | Attending: Emergency Medicine | Admitting: Emergency Medicine

## 2021-09-20 DIAGNOSIS — R110 Nausea: Secondary | ICD-10-CM | POA: Insufficient documentation

## 2021-09-20 DIAGNOSIS — R42 Dizziness and giddiness: Secondary | ICD-10-CM | POA: Diagnosis not present

## 2021-09-20 DIAGNOSIS — R079 Chest pain, unspecified: Secondary | ICD-10-CM | POA: Insufficient documentation

## 2021-09-20 DIAGNOSIS — R519 Headache, unspecified: Secondary | ICD-10-CM | POA: Diagnosis not present

## 2021-09-20 DIAGNOSIS — Z5321 Procedure and treatment not carried out due to patient leaving prior to being seen by health care provider: Secondary | ICD-10-CM | POA: Diagnosis not present

## 2021-09-20 DIAGNOSIS — I10 Essential (primary) hypertension: Secondary | ICD-10-CM

## 2021-09-20 MED ORDER — LISINOPRIL 10 MG PO TABS: ORAL_TABLET | ORAL | 0 refills | Status: DC

## 2021-09-20 NOTE — ED Provider Notes (Signed)
Emergency Medicine Provider Triage Evaluation Note  Kara Kelley , a 65 y.o. female  was evaluated in triage.  Pt complains of headache and neck pain which has been an ongoing issue for her but she presented to the emergency room because she had dizziness earlier today.  She also reports fluttering in her chest which also has been going on for months but was more profound today.  She denies shortness of breath, chest pain, or lightheadedness.  She reports she was evaluated by Dr. Dutch Quint her neurosurgeon and had an MRI done last week for her head and neck pain.  Review of Systems  Positive: Headache, neck pain, dizziness, palpitations Negative: Chest pain, shortness of breath, fever  Physical Exam  BP (!) 157/89 (BP Location: Left Arm)   Pulse (!) 106   Temp 98.1 F (36.7 C) (Oral)   Resp 16   Ht 4\' 11"  (1.499 m)   Wt 90.7 kg   SpO2 96%   BMI 40.40 kg/m  Gen:   Awake, no distress   Resp:  Normal effort  MSK:   Moves extremities without difficulty  Other:    Medical Decision Making  Medically screening exam initiated at 9:07 PM.  Appropriate orders placed.  Kara Kelley was informed that the remainder of the evaluation will be completed by another provider, this initial triage assessment does not replace that evaluation, and the importance of remaining in the ED until their evaluation is complete.     Loma Sousa, PA-C 09/20/21 2109    2110, MD 09/26/21 [phone]

## 2021-09-20 NOTE — ED Triage Notes (Signed)
Pt reports with dizziness, headache, nausea, and chest pain x 2 weeks. Pt states that it became more worse today. Pt reports having an MRI done for her headaches previously.

## 2021-09-20 NOTE — ED Notes (Signed)
Unsuccessful at obtaining labs.

## 2021-09-23 ENCOUNTER — Other Ambulatory Visit: Payer: Self-pay | Admitting: Neurosurgery

## 2021-09-24 ENCOUNTER — Encounter: Payer: Self-pay | Admitting: Nurse Practitioner

## 2021-10-12 ENCOUNTER — Other Ambulatory Visit: Payer: Self-pay

## 2021-10-12 DIAGNOSIS — M62838 Other muscle spasm: Secondary | ICD-10-CM

## 2021-10-12 DIAGNOSIS — N183 Chronic kidney disease, stage 3 unspecified: Secondary | ICD-10-CM

## 2021-10-12 DIAGNOSIS — E1122 Type 2 diabetes mellitus with diabetic chronic kidney disease: Secondary | ICD-10-CM

## 2021-10-12 DIAGNOSIS — K219 Gastro-esophageal reflux disease without esophagitis: Secondary | ICD-10-CM

## 2021-10-12 DIAGNOSIS — R519 Headache, unspecified: Secondary | ICD-10-CM

## 2021-10-12 DIAGNOSIS — I10 Essential (primary) hypertension: Secondary | ICD-10-CM

## 2021-10-12 MED ORDER — HYDROXYZINE HCL 25 MG PO TABS: 25.0000 mg | ORAL_TABLET | Freq: Three times a day (TID) | ORAL | 1 refills | Status: DC | PRN

## 2021-10-12 MED ORDER — OZEMPIC (1 MG/DOSE) 4 MG/3ML ~~LOC~~ SOPN: 1.0000 mg | PEN_INJECTOR | SUBCUTANEOUS | 0 refills | Status: DC

## 2021-10-12 MED ORDER — BUPROPION HCL ER (XL) 300 MG PO TB24: 300.0000 mg | ORAL_TABLET | Freq: Every day | ORAL | 0 refills | Status: DC

## 2021-10-12 MED ORDER — GABAPENTIN 300 MG PO CAPS: ORAL_CAPSULE | ORAL | 0 refills | Status: DC

## 2021-10-12 MED ORDER — LISINOPRIL 10 MG PO TABS: ORAL_TABLET | ORAL | 0 refills | Status: DC

## 2021-10-12 MED ORDER — CYCLOBENZAPRINE HCL 5 MG PO TABS: ORAL_TABLET | ORAL | 1 refills | Status: DC

## 2021-10-12 MED ORDER — ONDANSETRON HCL 4 MG PO TABS: 4.0000 mg | ORAL_TABLET | Freq: Three times a day (TID) | ORAL | 0 refills | Status: DC | PRN

## 2021-10-12 MED ORDER — LEVEMIR FLEXTOUCH 100 UNIT/ML ~~LOC~~ SOPN: 30.0000 [IU] | PEN_INJECTOR | Freq: Every day | SUBCUTANEOUS | 5 refills | Status: DC

## 2021-10-12 MED ORDER — SPIRONOLACTONE 50 MG PO TABS: ORAL_TABLET | ORAL | 0 refills | Status: DC

## 2021-10-12 MED ORDER — OMEPRAZOLE 40 MG PO CPDR: DELAYED_RELEASE_CAPSULE | ORAL | 1 refills | Status: DC

## 2021-10-14 ENCOUNTER — Other Ambulatory Visit: Payer: Self-pay

## 2021-10-14 MED ORDER — GABAPENTIN 300 MG PO CAPS: ORAL_CAPSULE | ORAL | 0 refills | Status: DC

## 2021-10-18 NOTE — Pre-Procedure Instructions (Addendum)
Surgical Instructions    Your procedure is scheduled on Monday, January 16th.  Report to [facility] Main Entrance "A" at 6:00 A.M., then check in with the Admitting office.  Call this number if you have problems the morning of surgery:  [phone]   If you have any questions prior to your surgery date call [phone]: Open Monday-Friday 8am-4pm    Remember:  Do not eat or drink after midnight the night before your surgery   Take these medicines the morning of surgery with A SIP OF WATER  ARIPiprazole (ABILIFY) buPROPion (WELLBUTRIN XL)  LINZESS  omeprazole (PRILOSEC)   Take these medications as needed albuterol (VENTOLIN HFA) inhaler-please bring inhaler with you.  cyclobenzaprine (FLEXERIL) gabapentin (NEURONTIN)  hydrOXYzine (ATARAX)  ondansetron (ZOFRAN)  oxyCODONE-acetaminophen (PERCOCET)   As of today, STOP taking any Aspirin (unless otherwise instructed by your surgeon) Aleve, Naproxen, Ibuprofen, Motrin, Advil, Goody's, BC's, all herbal medications, fish oil, and all vitamins. This includes: meloxicam (MOBIC).           WHAT DO I DO ABOUT MY DIABETES MEDICATION?  THE NIGHT BEFORE SURGERY, take 15 units of insulin detemir (LEVEMIR FLEXTOUCH) insulin.      The day of surgery, do not take other diabetes injectables, including Semaglutide, 1 MG/DOSE, (OZEMPIC, 1 MG/DOSE,).  If your CBG is greater than 220 mg/dL, you may take  of your sliding scale (NOVOLOG FLEXPEN) dose of insulin.   HOW TO MANAGE YOUR DIABETES BEFORE AND AFTER SURGERY  Why is it important to control my blood sugar before and after surgery? Improving blood sugar levels before and after surgery helps healing and can limit problems. A way of improving blood sugar control is eating a healthy diet by:  Eating less sugar and carbohydrates  Increasing activity/exercise  Talking with your doctor about reaching your blood sugar goals High blood sugars (greater than 180 mg/dL) can raise your risk of  infections and slow your recovery, so you will need to focus on controlling your diabetes during the weeks before surgery. Make sure that the doctor who takes care of your diabetes knows about your planned surgery including the date and location.  How do I manage my blood sugar before surgery? Check your blood sugar at least 4 times a day, starting 2 days before surgery, to make sure that the level is not too high or low.  Check your blood sugar the morning of your surgery when you wake up and every 2 hours until you get to the Short Stay unit.  If your blood sugar is less than 70 mg/dL, you will need to treat for low blood sugar: Do not take insulin. Treat a low blood sugar (less than 70 mg/dL) with  cup of clear juice (cranberry or apple), 4 glucose tablets, OR glucose gel. Recheck blood sugar in 15 minutes after treatment (to make sure it is greater than 70 mg/dL). If your blood sugar is not greater than 70 mg/dL on recheck, call [phone] for further instructions. Report your blood sugar to the short stay nurse when you get to Short Stay.  If you are admitted to the hospital after surgery: Your blood sugar will be checked by the staff and you will probably be given insulin after surgery (instead of oral diabetes medicines) to make sure you have good blood sugar levels. The goal for blood sugar control after surgery is 80-180 mg/dL.  Do NOT Smoke (Tobacco/Vaping) or drink Alcohol 24 hours prior to your procedure.  If you use a  CPAP at night, you may bring all equipment for your overnight stay.   Contacts, glasses, piercing's, hearing aid's, dentures or partials may not be worn into surgery, please bring cases for these belongings.    For patients admitted to the hospital, discharge time will be determined by your treatment team.   Patients discharged the day of surgery will not be allowed to drive home, and someone needs to stay with them for 24 hours.  NO VISITORS WILL BE ALLOWED  IN PRE-OP WHERE PATIENTS GET READY FOR SURGERY.  ONLY 1 SUPPORT PERSON MAY BE PRESENT IN THE WAITING ROOM WHILE YOU ARE IN SURGERY.  IF YOU ARE TO BE ADMITTED, ONCE YOU ARE IN YOUR ROOM YOU WILL BE ALLOWED TWO (2) VISITORS.  Minor children may have two parents present. Special consideration for safety and communication needs will be reviewed on a case by case basis.   Special instructions:   [facility]- Preparing For Surgery  Before surgery, you can play an important role. Because skin is not sterile, your skin needs to be as free of germs as possible. You can reduce the number of germs on your skin by washing with CHG (chlorahexidine gluconate) Soap before surgery.  CHG is an antiseptic cleaner which kills germs and bonds with the skin to continue killing germs even after washing.    Oral Hygiene is also important to reduce your risk of infection.  Remember - BRUSH YOUR TEETH THE MORNING OF SURGERY WITH YOUR REGULAR TOOTHPASTE  Please do not use if you have an allergy to CHG or antibacterial soaps. If your skin becomes reddened/irritated stop using the CHG.  Do not shave (including legs and underarms) for at least 48 hours prior to first CHG shower. It is OK to shave your face.  Please follow these instructions carefully.   Shower the NIGHT BEFORE SURGERY and the MORNING OF SURGERY  If you chose to wash your hair, wash your hair first as usual with your normal shampoo.  After you shampoo, rinse your hair and body thoroughly to remove the shampoo.  Use CHG Soap as you would any other liquid soap. You can apply CHG directly to the skin and wash gently with a scrungie or a clean washcloth.   Apply the CHG Soap to your body ONLY FROM THE NECK DOWN.  Do not use on open wounds or open sores. Avoid contact with your eyes, ears, mouth and genitals (private parts). Wash Face and genitals (private parts)  with your normal soap.   Wash thoroughly, paying special attention to the area where your  surgery will be performed.  Thoroughly rinse your body with warm water from the neck down.  DO NOT shower/wash with your normal soap after using and rinsing off the CHG Soap.  Pat yourself dry with a CLEAN TOWEL.  Wear CLEAN PAJAMAS to bed the night before surgery  Place CLEAN SHEETS on your bed the night before your surgery  DO NOT SLEEP WITH PETS.   Day of Surgery: Shower with CHG soap. Do not wear jewelry, make up, nail polish, gel polish, artificial nails, or any other type of covering on natural nails including finger and toenails. If patients have artificial nails, gel coating, etc. that need to be removed by a nail salon please have this removed prior to surgery. Surgery may need to be canceled/delayed if the surgeon/ anesthesia feels like the patient is unable to be adequately monitored. Do not wear lotions, powders, perfumes, or deodorant. Do not  shave 48 hours prior to surgery.  Do not bring valuables to the hospital. [facility] is not responsible for any belongings or valuables. Wear Clean/Comfortable clothing the morning of surgery Remember to brush your teeth WITH YOUR REGULAR TOOTHPASTE.   Please read over the following fact sheets that you were given.   3 days prior to your procedure or After your COVID test   You are not required to quarantine however you are required to wear a well-fitting mask when you are out and around people not in your household. If your mask becomes wet or soiled, replace with a new one.   Wash your hands often with soap and water for 20 seconds or clean your hands with an alcohol-based hand sanitizer that contains at least 60% alcohol.   Do not share personal items.   Notify your provider:  o if you are in close contact with someone who has COVID  o or if you develop a fever of 100.4 or greater, sneezing, cough, sore throat, shortness of breath or body aches.

## 2021-10-19 ENCOUNTER — Other Ambulatory Visit: Payer: Self-pay

## 2021-10-19 ENCOUNTER — Encounter (HOSPITAL_COMMUNITY): Payer: Self-pay

## 2021-10-19 ENCOUNTER — Encounter (HOSPITAL_COMMUNITY)
Admission: RE | Admit: 2021-10-19 | Discharge: 2021-10-19 | Disposition: A | Discharge status: Home / Self Care | Payer: Medicaid Other | Source: Ambulatory Visit | Attending: Neurosurgery | Admitting: Neurosurgery

## 2021-10-19 VITALS — BP 132/73 | HR 86 | Temp 97.8°F | Resp 18 | Ht 59.0 in | Wt 188.6 lb

## 2021-10-19 DIAGNOSIS — Z01812 Encounter for preprocedural laboratory examination: Secondary | ICD-10-CM | POA: Diagnosis present

## 2021-10-19 DIAGNOSIS — K769 Liver disease, unspecified: Secondary | ICD-10-CM

## 2021-10-19 DIAGNOSIS — Z794 Long term (current) use of insulin: Secondary | ICD-10-CM | POA: Insufficient documentation

## 2021-10-19 DIAGNOSIS — I2510 Atherosclerotic heart disease of native coronary artery without angina pectoris: Secondary | ICD-10-CM

## 2021-10-19 DIAGNOSIS — E119 Type 2 diabetes mellitus without complications: Secondary | ICD-10-CM | POA: Insufficient documentation

## 2021-10-19 DIAGNOSIS — K759 Inflammatory liver disease, unspecified: Secondary | ICD-10-CM | POA: Insufficient documentation

## 2021-10-19 DIAGNOSIS — Z01818 Encounter for other preprocedural examination: Secondary | ICD-10-CM

## 2021-10-19 LAB — CBC
HCT: 31.4 % — ABNORMAL LOW (ref 36.0–46.0)
Hemoglobin: 10 g/dL — ABNORMAL LOW (ref 12.0–15.0)
MCH: 24.5 pg — ABNORMAL LOW (ref 26.0–34.0)
MCHC: 31.8 g/dL (ref 30.0–36.0)
MCV: 77 fL — ABNORMAL LOW (ref 80.0–100.0)
Platelets: 151 10*3/uL (ref 150–400)
RBC: 4.08 MIL/uL (ref 3.87–5.11)
RDW: 12.8 % (ref 11.5–15.5)
WBC: 6 10*3/uL (ref 4.0–10.5)
nRBC: 0 % (ref 0.0–0.2)

## 2021-10-19 LAB — COMPREHENSIVE METABOLIC PANEL
ALT: 19 U/L (ref 0–44)
AST: 17 U/L (ref 15–41)
Albumin: 3.7 g/dL (ref 3.5–5.0)
Alkaline Phosphatase: 82 U/L (ref 38–126)
Anion gap: 12 (ref 5–15)
BUN: 11 mg/dL (ref 8–23)
CO2: 21 mmol/L — ABNORMAL LOW (ref 22–32)
Calcium: 10.1 mg/dL (ref 8.9–10.3)
Chloride: 105 mmol/L (ref 98–111)
Creatinine, Ser: 1.01 mg/dL — ABNORMAL HIGH (ref 0.44–1.00)
GFR, Estimated: >60 mL/min (ref >60)
Glucose, Bld: 149 mg/dL — ABNORMAL HIGH (ref 70–99)
Potassium: 4.4 mmol/L (ref 3.5–5.1)
Sodium: 138 mmol/L (ref 135–145)
Total Bilirubin: 0.2 mg/dL — ABNORMAL LOW (ref 0.3–1.2)
Total Protein: 6.7 g/dL (ref 6.5–8.1)

## 2021-10-19 LAB — TYPE AND SCREEN
ABO/RH(D): A POS
Antibody Screen: NEGATIVE

## 2021-10-19 LAB — HEMOGLOBIN A1C
Hgb A1c MFr Bld: 6.9 % — ABNORMAL HIGH (ref 4.8–5.6)
Mean Plasma Glucose: 151.33 mg/dL

## 2021-10-19 LAB — SURGICAL PCR SCREEN
MRSA, PCR: NEGATIVE
Staphylococcus aureus: NEGATIVE

## 2021-10-19 LAB — GLUCOSE, CAPILLARY: Glucose-Capillary: 133 mg/dL — ABNORMAL HIGH (ref 70–99)

## 2021-10-19 NOTE — Progress Notes (Signed)
PCP - [name], FNP Cardiologist - denies  PPM/ICD - n/a  Chest x-ray - 09/20/21 EKG - 09/20/21 Stress Test - denies ECHO - denies Cardiac Cath - denies  Sleep Study - denies CPAP - denies  Fasting Blood Sugar - 120-140 Checks Blood Sugar __4___ times a day CBG at PAT: 133 Last A1C: 6.9 on 06/29/21, will collect A1C today.  Blood Thinner Instructions: n/a Aspirin Instructions: n/a  NPO  COVID TEST- Scheduled for Thursday, 10/21/21 at 0900.  Anesthesia review:   Patient denies shortness of breath, fever, cough and chest pain at PAT appointment   All instructions explained to the patient, with a verbal understanding of the material. Patient agrees to go over the instructions while at home for a better understanding. Patient also instructed to self quarantine after being tested for COVID-19. The opportunity to ask questions was provided.

## 2021-10-21 ENCOUNTER — Other Ambulatory Visit (HOSPITAL_COMMUNITY)
Admission: RE | Admit: 2021-10-21 | Discharge: 2021-10-21 | Disposition: A | Discharge status: Home / Self Care | Payer: Medicaid Other | Source: Ambulatory Visit | Attending: Neurosurgery | Admitting: Neurosurgery

## 2021-10-21 DIAGNOSIS — Z01818 Encounter for other preprocedural examination: Secondary | ICD-10-CM

## 2021-10-21 DIAGNOSIS — Z20822 Contact with and (suspected) exposure to covid-19: Secondary | ICD-10-CM | POA: Insufficient documentation

## 2021-10-21 DIAGNOSIS — Z01812 Encounter for preprocedural laboratory examination: Secondary | ICD-10-CM | POA: Insufficient documentation

## 2021-10-21 LAB — SARS CORONAVIRUS 2 (TAT 6-24 HRS): SARS Coronavirus 2: NEGATIVE

## 2021-10-26 ENCOUNTER — Inpatient Hospital Stay (HOSPITAL_COMMUNITY): Payer: Medicaid Other | Admitting: Physician Assistant

## 2021-10-26 ENCOUNTER — Inpatient Hospital Stay (HOSPITAL_COMMUNITY): Payer: Medicaid Other

## 2021-10-26 ENCOUNTER — Inpatient Hospital Stay (HOSPITAL_COMMUNITY): Payer: Medicaid Other | Admitting: Certified Registered Nurse Anesthetist

## 2021-10-26 ENCOUNTER — Other Ambulatory Visit: Payer: Self-pay

## 2021-10-26 ENCOUNTER — Encounter (HOSPITAL_COMMUNITY)
Admission: RE | Disposition: A | Discharge status: Home / Self Care | Payer: Self-pay | Source: Home / Self Care | Attending: Neurosurgery

## 2021-10-26 ENCOUNTER — Inpatient Hospital Stay (HOSPITAL_COMMUNITY)
Admission: RE | Admit: 2021-10-26 | Discharge: 2021-10-27 | DRG: 472 | Disposition: A | Discharge status: Home / Self Care | Payer: Medicaid Other | Attending: Neurosurgery | Admitting: Neurosurgery

## 2021-10-26 ENCOUNTER — Encounter (HOSPITAL_COMMUNITY): Payer: Self-pay | Admitting: Neurosurgery

## 2021-10-26 DIAGNOSIS — M4802 Spinal stenosis, cervical region: Secondary | ICD-10-CM | POA: Diagnosis present

## 2021-10-26 DIAGNOSIS — Z8611 Personal history of tuberculosis: Secondary | ICD-10-CM | POA: Diagnosis not present

## 2021-10-26 DIAGNOSIS — M50121 Cervical disc disorder at C4-C5 level with radiculopathy: Secondary | ICD-10-CM | POA: Diagnosis present

## 2021-10-26 DIAGNOSIS — Z791 Long term (current) use of non-steroidal anti-inflammatories (NSAID): Secondary | ICD-10-CM

## 2021-10-26 DIAGNOSIS — Z972 Presence of dental prosthetic device (complete) (partial): Secondary | ICD-10-CM | POA: Diagnosis not present

## 2021-10-26 DIAGNOSIS — J449 Chronic obstructive pulmonary disease, unspecified: Secondary | ICD-10-CM | POA: Diagnosis present

## 2021-10-26 DIAGNOSIS — E119 Type 2 diabetes mellitus without complications: Secondary | ICD-10-CM | POA: Diagnosis present

## 2021-10-26 DIAGNOSIS — E785 Hyperlipidemia, unspecified: Secondary | ICD-10-CM | POA: Diagnosis present

## 2021-10-26 DIAGNOSIS — M1990 Unspecified osteoarthritis, unspecified site: Secondary | ICD-10-CM | POA: Diagnosis present

## 2021-10-26 DIAGNOSIS — Z833 Family history of diabetes mellitus: Secondary | ICD-10-CM

## 2021-10-26 DIAGNOSIS — I10 Essential (primary) hypertension: Secondary | ICD-10-CM | POA: Diagnosis present

## 2021-10-26 DIAGNOSIS — K219 Gastro-esophageal reflux disease without esophagitis: Secondary | ICD-10-CM | POA: Diagnosis present

## 2021-10-26 DIAGNOSIS — M2578 Osteophyte, vertebrae: Secondary | ICD-10-CM | POA: Diagnosis present

## 2021-10-26 DIAGNOSIS — Z9851 Tubal ligation status: Secondary | ICD-10-CM | POA: Diagnosis not present

## 2021-10-26 DIAGNOSIS — Z8249 Family history of ischemic heart disease and other diseases of the circulatory system: Secondary | ICD-10-CM | POA: Diagnosis not present

## 2021-10-26 DIAGNOSIS — Z981 Arthrodesis status: Secondary | ICD-10-CM | POA: Diagnosis not present

## 2021-10-26 DIAGNOSIS — M47812 Spondylosis without myelopathy or radiculopathy, cervical region: Secondary | ICD-10-CM | POA: Diagnosis present

## 2021-10-26 DIAGNOSIS — M4722 Other spondylosis with radiculopathy, cervical region: Secondary | ICD-10-CM | POA: Diagnosis present

## 2021-10-26 DIAGNOSIS — Z79899 Other long term (current) drug therapy: Secondary | ICD-10-CM | POA: Diagnosis not present

## 2021-10-26 DIAGNOSIS — Z87891 Personal history of nicotine dependence: Secondary | ICD-10-CM

## 2021-10-26 DIAGNOSIS — M50021 Cervical disc disorder at C4-C5 level with myelopathy: Secondary | ICD-10-CM | POA: Diagnosis present

## 2021-10-26 DIAGNOSIS — M4712 Other spondylosis with myelopathy, cervical region: Secondary | ICD-10-CM | POA: Diagnosis present

## 2021-10-26 DIAGNOSIS — Z794 Long term (current) use of insulin: Secondary | ICD-10-CM

## 2021-10-26 DIAGNOSIS — Z419 Encounter for procedure for purposes other than remedying health state, unspecified: Secondary | ICD-10-CM

## 2021-10-26 HISTORY — PX: ANTERIOR CERVICAL DECOMP/DISCECTOMY FUSION: SHX1161

## 2021-10-26 LAB — GLUCOSE, CAPILLARY
Glucose-Capillary: 159 mg/dL — ABNORMAL HIGH (ref 70–99)
Glucose-Capillary: 179 mg/dL — ABNORMAL HIGH (ref 70–99)
Glucose-Capillary: 254 mg/dL — ABNORMAL HIGH (ref 70–99)
Glucose-Capillary: 227 mg/dL — ABNORMAL HIGH (ref 70–99)

## 2021-10-26 SURGERY — ANTERIOR CERVICAL DECOMPRESSION/DISCECTOMY FUSION 3 LEVELS
Anesthesia: General | Site: Neck

## 2021-10-26 MED ORDER — ACETAMINOPHEN 500 MG PO TABS: 1000.0000 mg | ORAL_TABLET | Freq: Once | ORAL | Status: DC | PRN

## 2021-10-26 MED ORDER — DEXMEDETOMIDINE (PRECEDEX) IN NS 20 MCG/5ML (4 MCG/ML) IV SYRINGE
PREFILLED_SYRINGE | INTRAVENOUS | Status: AC
  Filled 2021-10-26: qty 5

## 2021-10-26 MED ORDER — ONDANSETRON HCL 4 MG/2ML IJ SOLN
INTRAMUSCULAR | Status: AC
  Filled 2021-10-26: qty 2

## 2021-10-26 MED ORDER — CYCLOBENZAPRINE HCL 10 MG PO TABS
10.0000 mg | ORAL_TABLET | Freq: Three times a day (TID) | ORAL | Status: DC | PRN
  Administered 2021-10-26: 10 mg via ORAL
  Filled 2021-10-26: qty 1

## 2021-10-26 MED ORDER — SPIRONOLACTONE 25 MG PO TABS
50.0000 mg | ORAL_TABLET | Freq: Every day | ORAL | Status: DC
  Administered 2021-10-26 – 2021-10-27 (×2): 50 mg via ORAL
  Filled 2021-10-26 (×2): qty 2

## 2021-10-26 MED ORDER — SODIUM CHLORIDE 0.9 % IV SOLN
250.0000 mL | INTRAVENOUS | Status: DC
  Administered 2021-10-26: 250 mL via INTRAVENOUS

## 2021-10-26 MED ORDER — ONDANSETRON HCL 4 MG PO TABS: 4.0000 mg | ORAL_TABLET | Freq: Four times a day (QID) | ORAL | Status: DC | PRN

## 2021-10-26 MED ORDER — SUGAMMADEX SODIUM 200 MG/2ML IV SOLN
INTRAVENOUS | Status: DC | PRN
Start: 2021-10-26 — End: 2021-10-26
  Administered 2021-10-26: 200 mg via INTRAVENOUS

## 2021-10-26 MED ORDER — LINACLOTIDE 145 MCG PO CAPS
145.0000 ug | ORAL_CAPSULE | Freq: Every day | ORAL | Status: DC
  Administered 2021-10-27: 145 ug via ORAL
  Filled 2021-10-26: qty 1

## 2021-10-26 MED ORDER — SEMAGLUTIDE (1 MG/DOSE) 4 MG/3ML ~~LOC~~ SOPN: 1.0000 mg | PEN_INJECTOR | SUBCUTANEOUS | Status: DC

## 2021-10-26 MED ORDER — ACETAMINOPHEN 650 MG RE SUPP: 650.0000 mg | RECTAL | Status: DC | PRN

## 2021-10-26 MED ORDER — TRAZODONE HCL 150 MG PO TABS
150.0000 mg | ORAL_TABLET | Freq: Every day | ORAL | Status: DC
  Administered 2021-10-26: 150 mg via ORAL
  Filled 2021-10-26: qty 1

## 2021-10-26 MED ORDER — ADULT MULTIVITAMIN W/MINERALS CH
1.0000 | ORAL_TABLET | Freq: Every day | ORAL | Status: DC
  Administered 2021-10-27: 1 via ORAL
  Filled 2021-10-26: qty 1

## 2021-10-26 MED ORDER — FENTANYL CITRATE (PF) 100 MCG/2ML IJ SOLN
INTRAMUSCULAR | Status: AC
  Filled 2021-10-26: qty 2

## 2021-10-26 MED ORDER — THROMBIN 20000 UNITS EX SOLR
CUTANEOUS | Status: AC
  Filled 2021-10-26: qty 20000

## 2021-10-26 MED ORDER — OXYCODONE HCL 5 MG PO TABS
5.0000 mg | ORAL_TABLET | Freq: Once | ORAL | Status: AC | PRN
  Administered 2021-10-26: 5 mg via ORAL

## 2021-10-26 MED ORDER — OMEPRAZOLE 40 MG PO CPDR: DELAYED_RELEASE_CAPSULE | ORAL | 1 refills | Status: DC

## 2021-10-26 MED ORDER — OXYCODONE-ACETAMINOPHEN 5-325 MG PO TABS
1.0000 | ORAL_TABLET | ORAL | Status: DC | PRN
  Administered 2021-10-26 – 2021-10-27 (×4): 2 via ORAL
  Filled 2021-10-26 (×4): qty 2

## 2021-10-26 MED ORDER — HYDROMORPHONE HCL 1 MG/ML IJ SOLN: 1.0000 mg | INTRAMUSCULAR | Status: DC | PRN

## 2021-10-26 MED ORDER — OXYCODONE HCL 5 MG/5ML PO SOLN: 5.0000 mg | Freq: Once | ORAL | Status: AC | PRN

## 2021-10-26 MED ORDER — MIDAZOLAM HCL 2 MG/2ML IJ SOLN
INTRAMUSCULAR | Status: AC
  Filled 2021-10-26: qty 2

## 2021-10-26 MED ORDER — PANTOPRAZOLE SODIUM 40 MG PO TBEC
40.0000 mg | DELAYED_RELEASE_TABLET | Freq: Every day | ORAL | Status: DC
  Administered 2021-10-26 – 2021-10-27 (×2): 40 mg via ORAL
  Filled 2021-10-26 (×2): qty 1

## 2021-10-26 MED ORDER — GABAPENTIN 300 MG PO CAPS
300.0000 mg | ORAL_CAPSULE | Freq: Three times a day (TID) | ORAL | Status: DC
  Administered 2021-10-26 – 2021-10-27 (×3): 300 mg via ORAL
  Filled 2021-10-26 (×3): qty 1

## 2021-10-26 MED ORDER — PROMETHAZINE HCL 25 MG PO TABS
25.0000 mg | ORAL_TABLET | Freq: Three times a day (TID) | ORAL | Status: DC | PRN
  Filled 2021-10-26: qty 1

## 2021-10-26 MED ORDER — ACETAMINOPHEN 10 MG/ML IV SOLN
INTRAVENOUS | Status: DC | PRN
Start: 2021-10-26 — End: 2021-10-26
  Administered 2021-10-26: 1000 mg via INTRAVENOUS

## 2021-10-26 MED ORDER — LACTATED RINGERS IV SOLN: INTRAVENOUS | Status: DC

## 2021-10-26 MED ORDER — ARIPIPRAZOLE 10 MG PO TABS
10.0000 mg | ORAL_TABLET | Freq: Every day | ORAL | Status: DC
  Administered 2021-10-27: 10 mg via ORAL
  Filled 2021-10-26 (×2): qty 1

## 2021-10-26 MED ORDER — SODIUM CHLORIDE 0.9% FLUSH
3.0000 mL | Freq: Two times a day (BID) | INTRAVENOUS | Status: DC
  Administered 2021-10-26 (×2): 3 mL via INTRAVENOUS

## 2021-10-26 MED ORDER — ACETAMINOPHEN 10 MG/ML IV SOLN
INTRAVENOUS | Status: AC
  Filled 2021-10-26: qty 100

## 2021-10-26 MED ORDER — INSULIN DETEMIR 100 UNIT/ML ~~LOC~~ SOLN
30.0000 [IU] | Freq: Every day | SUBCUTANEOUS | Status: DC
  Administered 2021-10-26: 30 [IU] via SUBCUTANEOUS
  Filled 2021-10-26 (×2): qty 0.3

## 2021-10-26 MED ORDER — ROCURONIUM BROMIDE 10 MG/ML (PF) SYRINGE
PREFILLED_SYRINGE | INTRAVENOUS | Status: DC | PRN
  Administered 2021-10-26: 30 mg via INTRAVENOUS
  Administered 2021-10-26: 70 mg via INTRAVENOUS

## 2021-10-26 MED ORDER — ONDANSETRON HCL 4 MG PO TABS: 4.0000 mg | ORAL_TABLET | Freq: Three times a day (TID) | ORAL | Status: DC | PRN

## 2021-10-26 MED ORDER — ONDANSETRON HCL 4 MG/2ML IJ SOLN: 4.0000 mg | Freq: Four times a day (QID) | INTRAMUSCULAR | Status: DC | PRN

## 2021-10-26 MED ORDER — MENTHOL 3 MG MT LOZG: 1.0000 | LOZENGE | OROMUCOSAL | Status: DC | PRN

## 2021-10-26 MED ORDER — CHLORHEXIDINE GLUCONATE 0.12 % MT SOLN
15.0000 mL | Freq: Once | OROMUCOSAL | Status: AC
  Administered 2021-10-26: 15 mL via OROMUCOSAL
  Filled 2021-10-26: qty 15

## 2021-10-26 MED ORDER — PROPOFOL 10 MG/ML IV BOLUS
INTRAVENOUS | Status: AC
  Filled 2021-10-26: qty 20

## 2021-10-26 MED ORDER — ROCURONIUM BROMIDE 10 MG/ML (PF) SYRINGE
PREFILLED_SYRINGE | INTRAVENOUS | Status: AC
  Filled 2021-10-26: qty 10

## 2021-10-26 MED ORDER — LIDOCAINE 2% (20 MG/ML) 5 ML SYRINGE
INTRAMUSCULAR | Status: DC | PRN
  Administered 2021-10-26: 60 mg via INTRAVENOUS

## 2021-10-26 MED ORDER — INSULIN ASPART 100 UNIT/ML IJ SOLN
18.0000 [IU] | Freq: Three times a day (TID) | INTRAMUSCULAR | Status: DC
  Administered 2021-10-26 – 2021-10-27 (×2): 18 [IU] via SUBCUTANEOUS

## 2021-10-26 MED ORDER — CHLORHEXIDINE GLUCONATE CLOTH 2 % EX PADS: 6.0000 | MEDICATED_PAD | Freq: Once | CUTANEOUS | Status: DC

## 2021-10-26 MED ORDER — ALBUTEROL SULFATE (2.5 MG/3ML) 0.083% IN NEBU: 3.0000 mL | INHALATION_SOLUTION | Freq: Four times a day (QID) | RESPIRATORY_TRACT | Status: DC | PRN

## 2021-10-26 MED ORDER — DEXMEDETOMIDINE (PRECEDEX) IN NS 20 MCG/5ML (4 MCG/ML) IV SYRINGE
PREFILLED_SYRINGE | INTRAVENOUS | Status: DC | PRN
  Administered 2021-10-26 (×2): 8 ug via INTRAVENOUS

## 2021-10-26 MED ORDER — SODIUM CHLORIDE 0.9% FLUSH: 3.0000 mL | INTRAVENOUS | Status: DC | PRN

## 2021-10-26 MED ORDER — ORAL CARE MOUTH RINSE: 15.0000 mL | Freq: Once | OROMUCOSAL | Status: AC

## 2021-10-26 MED ORDER — 0.9 % SODIUM CHLORIDE (POUR BTL) OPTIME
TOPICAL | Status: DC | PRN
  Administered 2021-10-26: 1000 mL

## 2021-10-26 MED ORDER — FENTANYL CITRATE (PF) 250 MCG/5ML IJ SOLN
INTRAMUSCULAR | Status: AC
  Filled 2021-10-26: qty 5

## 2021-10-26 MED ORDER — CEFAZOLIN SODIUM-DEXTROSE 1-4 GM/50ML-% IV SOLN
1.0000 g | Freq: Three times a day (TID) | INTRAVENOUS | Status: AC
  Administered 2021-10-26 – 2021-10-27 (×2): 1 g via INTRAVENOUS
  Filled 2021-10-26 (×2): qty 50

## 2021-10-26 MED ORDER — FENTANYL CITRATE (PF) 100 MCG/2ML IJ SOLN
25.0000 ug | INTRAMUSCULAR | Status: DC | PRN
  Administered 2021-10-26 (×2): 50 ug via INTRAVENOUS

## 2021-10-26 MED ORDER — DEXAMETHASONE SODIUM PHOSPHATE 10 MG/ML IJ SOLN
INTRAMUSCULAR | Status: DC | PRN
  Administered 2021-10-26: 10 mg via INTRAVENOUS

## 2021-10-26 MED ORDER — DEXAMETHASONE SODIUM PHOSPHATE 10 MG/ML IJ SOLN
INTRAMUSCULAR | Status: AC
  Filled 2021-10-26: qty 1

## 2021-10-26 MED ORDER — ACETAMINOPHEN 325 MG PO TABS: 650.0000 mg | ORAL_TABLET | ORAL | Status: DC | PRN

## 2021-10-26 MED ORDER — PHENOL 1.4 % MT LIQD
1.0000 | OROMUCOSAL | Status: DC | PRN
  Administered 2021-10-27: 1 via OROMUCOSAL
  Filled 2021-10-26: qty 177

## 2021-10-26 MED ORDER — BUPROPION HCL ER (XL) 300 MG PO TB24
300.0000 mg | ORAL_TABLET | Freq: Every day | ORAL | Status: DC
  Administered 2021-10-27: 300 mg via ORAL
  Filled 2021-10-26: qty 1

## 2021-10-26 MED ORDER — OXYCODONE HCL 5 MG PO TABS
ORAL_TABLET | ORAL | Status: AC
  Filled 2021-10-26: qty 1

## 2021-10-26 MED ORDER — HYDROCODONE-ACETAMINOPHEN 5-325 MG PO TABS: 1.0000 | ORAL_TABLET | ORAL | Status: DC | PRN

## 2021-10-26 MED ORDER — ONDANSETRON HCL 4 MG/2ML IJ SOLN
INTRAMUSCULAR | Status: DC | PRN
Start: 2021-10-26 — End: 2021-10-26
  Administered 2021-10-26: 4 mg via INTRAVENOUS

## 2021-10-26 MED ORDER — ACETAMINOPHEN 160 MG/5ML PO SOLN: 1000.0000 mg | Freq: Once | ORAL | Status: DC | PRN

## 2021-10-26 MED ORDER — LIDOCAINE 2% (20 MG/ML) 5 ML SYRINGE
INTRAMUSCULAR | Status: AC
  Filled 2021-10-26: qty 5

## 2021-10-26 MED ORDER — PHENYLEPHRINE HCL-NACL 20-0.9 MG/250ML-% IV SOLN
INTRAVENOUS | Status: DC | PRN
  Administered 2021-10-26: 20 ug/min via INTRAVENOUS

## 2021-10-26 MED ORDER — PROPOFOL 10 MG/ML IV BOLUS
INTRAVENOUS | Status: DC | PRN
  Administered 2021-10-26: 200 mg via INTRAVENOUS

## 2021-10-26 MED ORDER — LISINOPRIL 10 MG PO TABS
10.0000 mg | ORAL_TABLET | Freq: Every day | ORAL | Status: DC
Start: 2021-10-26 — End: 2021-10-27
  Administered 2021-10-26 – 2021-10-27 (×2): 10 mg via ORAL
  Filled 2021-10-26 (×2): qty 1

## 2021-10-26 MED ORDER — HYDROXYZINE HCL 25 MG PO TABS
25.0000 mg | ORAL_TABLET | Freq: Three times a day (TID) | ORAL | Status: DC | PRN
  Administered 2021-10-26 – 2021-10-27 (×2): 25 mg via ORAL
  Filled 2021-10-26 (×2): qty 1

## 2021-10-26 MED ORDER — CEFAZOLIN SODIUM-DEXTROSE 2-4 GM/100ML-% IV SOLN
2.0000 g | INTRAVENOUS | Status: AC
  Administered 2021-10-26: 2 g via INTRAVENOUS
  Filled 2021-10-26: qty 100

## 2021-10-26 MED ORDER — FENTANYL CITRATE (PF) 250 MCG/5ML IJ SOLN
INTRAMUSCULAR | Status: DC | PRN
  Administered 2021-10-26 (×2): 50 ug via INTRAVENOUS
  Administered 2021-10-26: 100 ug via INTRAVENOUS
  Administered 2021-10-26: 50 ug via INTRAVENOUS

## 2021-10-26 MED ORDER — ACETAMINOPHEN 10 MG/ML IV SOLN: 1000.0000 mg | Freq: Once | INTRAVENOUS | Status: DC | PRN

## 2021-10-26 MED ORDER — THROMBIN 20000 UNITS EX SOLR: CUTANEOUS | Status: DC | PRN

## 2021-10-26 MED ORDER — MIDAZOLAM HCL 2 MG/2ML IJ SOLN
INTRAMUSCULAR | Status: DC | PRN
Start: 2021-10-26 — End: 2021-10-26
  Administered 2021-10-26: 2 mg via INTRAVENOUS

## 2021-10-26 SURGICAL SUPPLY — 55 items
APL SKNCLS STERI-STRIP NONHPOA (GAUZE/BANDAGES/DRESSINGS) ×1
BAG COUNTER SPONGE SURGICOUNT (BAG) ×2 IMPLANT
BAG DECANTER FOR FLEXI CONT (MISCELLANEOUS) ×2 IMPLANT
BAG SPNG CNTER NS LX DISP (BAG) ×1
BAND INSRT 18 STRL LF DISP RB (MISCELLANEOUS) ×2
BAND RUBBER #18 3X1/16 STRL (MISCELLANEOUS) ×4 IMPLANT
BENZOIN TINCTURE PRP APPL 2/3 (GAUZE/BANDAGES/DRESSINGS) ×2 IMPLANT
BIT DRILL 13 (BIT) ×1 IMPLANT
BUR MATCHSTICK NEURO 3.0 LAGG (BURR) ×2 IMPLANT
CAGE PEEK 6X14X11 (Cage) ×6 IMPLANT
CANISTER SUCT 3000ML PPV (MISCELLANEOUS) ×2 IMPLANT
CARTRIDGE OIL MAESTRO DRILL (MISCELLANEOUS) ×1 IMPLANT
DIFFUSER DRILL AIR PNEUMATIC (MISCELLANEOUS) ×2 IMPLANT
DRAPE C-ARM 42X72 X-RAY (DRAPES) ×4 IMPLANT
DRAPE LAPAROTOMY 100X72 PEDS (DRAPES) ×2 IMPLANT
DRAPE MICROSCOPE LEICA (MISCELLANEOUS) ×2 IMPLANT
DURAPREP 6ML APPLICATOR 50/CS (WOUND CARE) ×2 IMPLANT
ELECT COATED BLADE 2.86 ST (ELECTRODE) ×2 IMPLANT
ELECT REM PT RETURN 9FT ADLT (ELECTROSURGICAL) ×2
ELECTRODE REM PT RTRN 9FT ADLT (ELECTROSURGICAL) ×1 IMPLANT
EVACUATOR 1/8 PVC DRAIN (DRAIN) ×1 IMPLANT
GAUZE 4X4 16PLY ~~LOC~~+RFID DBL (SPONGE) IMPLANT
GAUZE SPONGE 4X4 12PLY STRL (GAUZE/BANDAGES/DRESSINGS) ×2 IMPLANT
GLOVE EXAM NITRILE XL STR (GLOVE) IMPLANT
GLOVE SURG LTX SZ9 (GLOVE) ×2 IMPLANT
GOWN STRL REUS W/ TWL LRG LVL3 (GOWN DISPOSABLE) IMPLANT
GOWN STRL REUS W/ TWL XL LVL3 (GOWN DISPOSABLE) IMPLANT
GOWN STRL REUS W/TWL 2XL LVL3 (GOWN DISPOSABLE) IMPLANT
GOWN STRL REUS W/TWL LRG LVL3 (GOWN DISPOSABLE)
GOWN STRL REUS W/TWL XL LVL3 (GOWN DISPOSABLE)
HALTER HD/CHIN CERV TRACTION D (MISCELLANEOUS) ×2 IMPLANT
HEMOSTAT POWDER KIT SURGIFOAM (HEMOSTASIS) ×2 IMPLANT
KIT BASIN OR (CUSTOM PROCEDURE TRAY) ×2 IMPLANT
KIT TURNOVER KIT B (KITS) ×2 IMPLANT
NDL SPNL 20GX3.5 QUINCKE YW (NEEDLE) ×1 IMPLANT
NEEDLE SPNL 20GX3.5 QUINCKE YW (NEEDLE) ×2 IMPLANT
NS IRRIG 1000ML POUR BTL (IV SOLUTION) ×2 IMPLANT
OIL CARTRIDGE MAESTRO DRILL (MISCELLANEOUS) ×2
PACK LAMINECTOMY NEURO (CUSTOM PROCEDURE TRAY) ×2 IMPLANT
PAD ARMBOARD 7.5X6 YLW CONV (MISCELLANEOUS) ×6 IMPLANT
PLATE 3 55XLCK NS SPNE CVD (Plate) IMPLANT
PLATE 3 ATLANTIS TRANS (Plate) ×2 IMPLANT
SCREW ST FIX 4 ATL 3120213 (Screw) ×8 IMPLANT
SPACER SPNL 11X14X6XPEEK CVD (Cage) IMPLANT
SPCR SPNL 11X14X6XPEEK CVD (Cage) ×3 IMPLANT
SPONGE INTESTINAL PEANUT (DISPOSABLE) ×2 IMPLANT
SPONGE SURGIFOAM ABS GEL 100 (HEMOSTASIS) ×2 IMPLANT
STRIP CLOSURE SKIN 1/2X4 (GAUZE/BANDAGES/DRESSINGS) ×2 IMPLANT
SUT VIC AB 3-0 SH 8-18 (SUTURE) ×2 IMPLANT
SUT VIC AB 4-0 RB1 18 (SUTURE) ×2 IMPLANT
TAPE CLOTH 4X10 WHT NS (GAUZE/BANDAGES/DRESSINGS) ×2 IMPLANT
TOWEL GREEN STERILE (TOWEL DISPOSABLE) ×2 IMPLANT
TOWEL GREEN STERILE FF (TOWEL DISPOSABLE) ×2 IMPLANT
TRAP SPECIMEN MUCUS 40CC (MISCELLANEOUS) ×2 IMPLANT
WATER STERILE IRR 1000ML POUR (IV SOLUTION) ×2 IMPLANT

## 2021-10-26 NOTE — Anesthesia Preprocedure Evaluation (Addendum)
Anesthesia Evaluation  Patient identified by MRN, date of birth, ID band Patient awake    Reviewed: Allergy & Precautions, NPO status , Patient's Chart, lab work & pertinent test results  History of Anesthesia Complications Negative for: history of anesthetic complications  Airway Mallampati: II  TM Distance: >3 FB Neck ROM: Full    Dental  (+) Dental Advisory Given, Upper Dentures, Partial Lower,    Pulmonary neg shortness of breath, asthma , neg sleep apnea, COPD,  COPD inhaler, neg recent URI, former smoker,    breath sounds clear to auscultation       Cardiovascular hypertension, Pt. on medications (-) angina(-) Past MI and (-) CHF  Rhythm:Regular     Neuro/Psych  Headaches, PSYCHIATRIC DISORDERS Anxiety Depression  Neuromuscular disease    GI/Hepatic GERD  ,(+)     substance abuse  alcohol use, Hepatitis -, CLab Results      Component                Value               Date                      ALT                      19                  10/19/2021                AST                      17                  10/19/2021                GGT                      CANCELED            06/15/2016                ALKPHOS                  82                  10/19/2021                BILITOT                  0.2 (L)             10/19/2021             treated   Endo/Other  diabetes, Type 2, Insulin Dependent  Renal/GU Renal InsufficiencyRenal disease     Musculoskeletal  (+) Arthritis ,   Abdominal   Peds  Hematology  (+) Blood dyscrasia, anemia , Lab Results      Component                Value               Date                      WBC                      6.0  10/19/2021                HGB                      10.0 (L)            10/19/2021                HCT                      31.4 (L)            10/19/2021                MCV                      77.0 (L)            10/19/2021                PLT                       151                 10/19/2021              Anesthesia Other Findings   Reproductive/Obstetrics                            Anesthesia Physical Anesthesia Plan  ASA: 3  Anesthesia Plan: General   Post-op Pain Management: Ofirmev IV (intra-op)   Induction: Intravenous  PONV Risk Score and Plan: 3 and Ondansetron and Dexamethasone  Airway Management Planned: Oral ETT  Additional Equipment: None  Intra-op Plan:   Post-operative Plan: Extubation in OR  Informed Consent: I have reviewed the patients History and Physical, chart, labs and discussed the procedure including the risks, benefits and alternatives for the proposed anesthesia with the patient or authorized representative who has indicated his/her understanding and acceptance.     Dental advisory given  Plan Discussed with: CRNA and Anesthesiologist  Anesthesia Plan Comments:         Anesthesia Quick Evaluation

## 2021-10-26 NOTE — Anesthesia Procedure Notes (Signed)
Procedure Name: Intubation Date/Time: 10/26/2021 10:28 AM Performed by: Reece Agar, CRNA Pre-anesthesia Checklist: Patient identified, Emergency Drugs available, Suction available and Patient being monitored Patient Re-evaluated:Patient Re-evaluated prior to induction Oxygen Delivery Method: Circle System Utilized Preoxygenation: Pre-oxygenation with 100% oxygen Induction Type: IV induction Ventilation: Mask ventilation without difficulty Laryngoscope Size: Glidescope and 3 Grade View: Grade I Tube type: Oral Number of attempts: 1 Airway Equipment and Method: Stylet Placement Confirmation: ETT inserted through vocal cords under direct vision, positive ETCO2 and breath sounds checked- equal and bilateral Secured at: 20 cm Tube secured with: Tape Dental Injury: Teeth and Oropharynx as per pre-operative assessment  Comments: Midline neck stabilization maintained throughout

## 2021-10-26 NOTE — Op Note (Signed)
Date of procedure: 10/26/2021  Date of dictation: Same  Service: Neurosurgery  Preoperative diagnosis: Cervical stenosis with myelopathy/radiculopathy  Postoperative diagnosis: Same  Procedure Name: C4-5, C5-6, C6-7 anterior cervical discectomy with interbody fusion utilizing interbody peek cages, local harvested autograft, and anterior plate instrumentation  Surgeon:Rossie Scarfone A.Treon Kehl, M.D.  Asst. Surgeon: Doran Durand, NP  Anesthesia: General  Indication: 66 year old female with progressive neck pain with radiating numbness paresthesias and weakness in both upper extremities.  Work-up demonstrates evidence of marked cervical disc generation with associated disc base collapse and spondylosis with significant cervical stenosis and cord compression with coexistent foraminal stenosis at C4-5, C5-6 and C6-7.  Patient presents now for 3 level anterior cervical decompression and fusion in hopes improving her symptoms.  Operative note: After induction of anesthesia, patient positioned supine with neck slightly extended and held in place with halter traction.  Patient's anterior cervical region prepped and draped sterilely.  Incision made overlying C5-6.  Dissection performed on the right.  Retractor placed.  Fluoroscopy used.  Levels confirmed.  Disc spaces at C4-5, C5-6 and C6-7 were incised.  Discectomy was then performed using various instruments down to level of the posterior annulus.  Microscope was brought to the field used throughout the remainder of the discectomies.  Remaining aspects of annulus and osteophytes removed using high-speed drill down to level the posterior longitudinal ligament.  Posterior logical medicine elevated and resected piecemeal fashion.  Underlying thecal sac was then identified.  Wide central decompression then performed undercutting the bodies of C4 and C5.  Decompression then proceeded each neural foramina.  Wide anterior foraminotomies were performed along course exiting C5  nerve roots bilaterally.  This point a very thorough decompression had been achieved.  There was no evidence of injury to the thecal sac or nerve roots.  Procedures then repeated at C5-6 and C6-7 again without complications.  Wounds was then irrigated.  Gelfoam was placed topically for hemostasis then removed.  [employer] anatomic peek cages were then impacted in place at all 3 levels.  Each cage had been packed with locally harvested autograft.  Atlantis translational plate was then placed over the C4, C5, C6 and C7 levels.  This then attached under fluoroscopic guidance using 13 mm fixed angle screws to each in all 4 levels.  All 8 screws given final tightening found to be solidly within the bone.  Locking screws engaged all levels.  Final images reveal good position the cages and the hardware with proper operative level with normal alignment of the spine.  Wounds then irrigated 1 final time.  Hemostasis was obtained using the bipolar cautery.  A medium Hemovac drain was left in the prevertebral space.  Wound is then closed in layers with Vicryl sutures.  Steri-Strips and sterile dressing were applied.  No apparent complications.  Patient tolerated the procedure well and she returns to the recovery room postop.

## 2021-10-26 NOTE — Progress Notes (Signed)
Orthopedic Tech Progress Note Patient Details:  Kara Kelley 04/01/1956 [id number]  PACU RN called requesting a SOFT COLLAR   Ortho Devices Type of Ortho Device: Soft collar Ortho Device/Splint Location: NECK Ortho Device/Splint Interventions: Ordered, Application   Post Interventions Patient Tolerated: Well Instructions Provided: Care of device  [name] 10/26/2021, 1:25 PM

## 2021-10-26 NOTE — H&P (Signed)
Kara Kelley is an 66 y.o. female.   Chief Complaint: Neck pain HPI: 66 year old female with chronic and progressively worsening neck pain with radiation to both upper extremities with associated numbness paresthesias and some weakness.  Work-up demonstrates evidence of severe multilevel cervical disc degeneration with associated spondylosis and significant stenosis with ongoing cord and exiting nerve root compression at C4-5, C5-6 and C6-7.  Patient presents now for 3 level anterior cervical discectomy and fusion in hopes improving her symptoms.  Past Medical History:  Diagnosis Date   Anemia    Anxiety    Arthritis    Asthma    Bunion    Callus    Chronic pain    Cocaine abuse ([place])    COPD (chronic obstructive pulmonary disease) (HCC)    Corns and callosities    Degenerative joint disease    Depression    Diabetes mellitus    Endometrial polyp    ETOH abuse    Gall stones    GERD (gastroesophageal reflux disease)    Headache    history of Migraines   Hepatitis C    Hep C   Hyperlipidemia    Hypertension    Insomnia    Spondylolisthesis of lumbar region    Substance abuse ([place])    alcoholism   Tuberculosis 1985   Wears dentures    Wears glasses     Past Surgical History:  Procedure Laterality Date   BACK SURGERY  2018   CESAREAN SECTION     x3   CHOLECYSTECTOMY N/A 08/11/2016   Procedure: LAPAROSCOPIC CHOLECYSTECTOMY WITH   INTRAOPERATIVE CHOLANGIOGRAM;  Surgeon: Stark Klein, MD;  Location: [facility];  Service: General;  Laterality: N/A;   COLONOSCOPY     Cotton Osteotomy w/ Graft Left 06/18/2009   Excision of Benign Lesion Right 01/30/2013   Rt Plantar   FOOT SURGERY     HAMMER TOE REPAIR Right 02/12/2016   RIGHT #5   Hammertoe Repair Left 06/18/2009   Lt #5   HYSTEROSCOPY N/A 06/20/2017   Procedure: DILATION AND CURETTAGE, HYSTEROSCOPY w/ Polypectomy;  Surgeon: Lavonia Drafts, MD;  Location: [facility];  Service: Gynecology;  Laterality: N/A;   LUMBAR  FUSION  2018   METATARSAL OSTEOTOMY Left 06/18/2009   #5   MULTIPLE TOOTH EXTRACTIONS     Nail Matrixectomy Left 06/18/2009   LT #1   OSTEOTOMY Right 01/30/2013   Rt #5   Phalangectomy Left 06/18/2009   LT #1   Phalangectomy Right 01/30/2013   Rt #1   TUBAL LIGATION      Family History  Problem Relation Age of Onset   Heart disease Mother    Diabetes Other        mat great aunt   Cirrhosis Other        mat great aunt   Cirrhosis Other        mat great uncles x 2   Colon cancer Neg Hx    Breast cancer Neg Hx    Social History:  reports that she quit smoking about 3 years ago. Her smoking use included cigarettes. She smoked an average of .5 packs per day. She has never used smokeless tobacco. She reports that she does not drink alcohol and does not use drugs.  Allergies: No Known Allergies  Medications Prior to Admission  Medication Sig Dispense Refill   albuterol (VENTOLIN HFA) 108 (90 Base) MCG/ACT inhaler Inhale 2 puffs into the lungs every 6 (six) hours as needed for wheezing  or shortness of breath. 18 g 2   ARIPiprazole (ABILIFY) 10 MG tablet Take 10 mg by mouth daily.      buPROPion (WELLBUTRIN XL) 300 MG 24 hr tablet Take 1 tablet (300 mg total) by mouth daily. 90 tablet 0   cyclobenzaprine (FLEXERIL) 5 MG tablet TAKE 1 TABLET(5 MG) BY MOUTH THREE TIMES DAILY AS NEEDED FOR MUSCLE SPASMS 30 tablet 1   gabapentin (NEURONTIN) 300 MG capsule TAKE 1 CAPSULE(300 MG) BY MOUTH THREE TIMES DAILY AS NEEDED FOR PAIN Strength: 300 mg 180 capsule 0   Glucagon (GVOKE HYPOPEN 2-PACK) 1 MG/0.2ML SOAJ Inject 1 each into the skin as needed. If blood sugar less than 60 0.4 mL 3   hydrOXYzine (ATARAX) 25 MG tablet Take 1 tablet (25 mg total) by mouth every 8 (eight) hours as needed for anxiety. 30 tablet 1   insulin detemir (LEVEMIR FLEXTOUCH) 100 UNIT/ML FlexPen Inject 30 Units into the skin at bedtime. 15 mL 5   LINZESS 145 MCG CAPS capsule Take 145 mcg by mouth daily before breakfast.       lisinopril (ZESTRIL) 10 MG tablet TAKE 1 TABLET(10 MG) BY MOUTH DAILY 90 tablet 0   meloxicam (MOBIC) 15 MG tablet Take 15 mg by mouth daily as needed for pain.     Multiple Vitamins-Minerals (MULTIVITAMIN WITH MINERALS) tablet Take 1 tablet by mouth daily. 50 +     NOVOLOG FLEXPEN 100 UNIT/ML FlexPen Inject 18 Units into the skin 3 (three) times daily with meals. 15 mL 3   omeprazole (PRILOSEC) 40 MG capsule TAKE 1 CAPSULE(40 MG) BY MOUTH DAILY BEFORE BREAKFAST 90 capsule 1   ondansetron (ZOFRAN) 4 MG tablet Take 1 tablet (4 mg total) by mouth 3 (three) times daily as needed for nausea or vomiting. 20 tablet 0   oxyCODONE-acetaminophen (PERCOCET) 5-325 MG tablet Take 1 tablet by mouth every 4 (four) hours as needed. 30 tablet 0   Semaglutide, 1 MG/DOSE, (OZEMPIC, 1 MG/DOSE,) 4 MG/3ML SOPN Inject 1 mg into the skin once a week. 9 mL 0   spironolactone (ALDACTONE) 50 MG tablet TAKE 1 TABLET(50 MG) BY MOUTH DAILY 90 tablet 0   traZODone (DESYREL) 150 MG tablet Take 150 mg by mouth at bedtime.      Accu-Chek FastClix Lancets MISC Use as directed to check blood sugars 2 times per day dx: e11.65 102 each prn   Accu-Chek Softclix Lancets lancets Use as instructed to check blood sugars twice daily E11.69 100 each 2   BD PEN NEEDLE NANO 2ND GEN 32G X 4 MM MISC USE THREE TIMES DAILY AS DIRECTED 200 each 3   Blood Glucose Monitoring Suppl (ACCU-CHEK GUIDE ME) w/Device KIT Use to check blood sugars twice daily E11.69 1 kit 1   Blood Glucose Monitoring Suppl (ACCU-CHEK NANO SMARTVIEW) w/Device KIT Use as directed to check blood sugars 2 times per day dx: e11.65 1 kit 0   glucose blood (ACCU-CHEK GUIDE) test strip Use as directed to check blood sugars 2 times per day dx: e11.69 300 strip 2   promethazine (PHENERGAN) 25 MG tablet Take 1 tablet (25 mg total) by mouth every 8 (eight) hours as needed for up to 3 days for nausea or vomiting. (Patient not taking: Reported on 10/12/2021) 9 tablet 0    Results for  orders placed or performed during the hospital encounter of 10/26/21 (from the past 48 hour(s))  Glucose, capillary     Status: Abnormal   Collection Time: 10/26/21  8:34 AM  Result Value Ref Range   Glucose-Capillary 159 (H) 70 - 99 mg/dL    Comment: Glucose reference range applies only to samples taken after fasting for at least 8 hours.   No results found.  Pertinent items noted in HPI and remainder of comprehensive ROS otherwise negative.  Blood pressure (!) 160/84, pulse (!) 109, temperature 98 F (36.7 C), temperature source Oral, resp. rate 18, height _0  (1.499 m), weight 85.3 kg, SpO2 95 %.  Patient is awake and alert.  She is oriented and appropriate.  Speech is fluent.  Judgment insight are intact.  Cranial nerve function normal bilateral.  Motor examination reveals some mild weakness of her grip and intrinsic strength bilaterally otherwise motor strength intact.  Sensory examination with some mild distal patchy sensory loss in both upper extremities.  Reflexes are normal active.  Gait is reasonably normal.  Posture is normal peer examination head ears eyes nose and throat is unremarked.  Chest and abdomen are benign.  Extremities are free of major deformity. Assessment/Plan C4-5, C5-6, C6-7 spondylosis with stenosis and ongoing neck pain and radiculopathy.  Plan C4-5, C5-6, C6-7 anterior cervical discectomy with interbody fusion utilizing interbody cages, local harvested autograft, and anterior plate instrumentation.  Risks and benefits been explained.  Patient wishes to proceed.  Mallie Mussel A Sheran Newstrom 10/26/2021, 9:48 AM

## 2021-10-26 NOTE — Transfer of Care (Signed)
Immediate Anesthesia Transfer of Care Note  Patient: Kara Kelley  Procedure(s) Performed: ACDF - C4-C5 - C5-C6 - C6-C7 (Neck)  Patient Location: PACU  Anesthesia Type:General  Level of Consciousness: awake and alert   Airway & Oxygen Therapy: Patient Spontanous Breathing and Patient connected to face mask oxygen  Post-op Assessment: Report given to RN, Post -op Vital signs reviewed and stable and Patient moving all extremities X 4  Post vital signs: Reviewed and stable  Last Vitals:  Vitals Value Taken Time  BP 148/69 10/26/21 1240  Temp    Pulse 103 10/26/21 1244  Resp 28 10/26/21 1244  SpO2 95 % 10/26/21 1244  Vitals shown include unvalidated device data.  Last Pain:  Vitals:   10/26/21 0918  TempSrc:   PainSc: 10-Worst pain ever      Patients Stated Pain Goal: 0 ([id number])  Complications: No notable events documented.

## 2021-10-26 NOTE — Brief Op Note (Signed)
10/26/2021  12:26 PM  PATIENT:  Kara Kelley  66 y.o. female  PRE-OPERATIVE DIAGNOSIS:  Stenosis  POST-OPERATIVE DIAGNOSIS:  Stenosis  PROCEDURE:  Procedure(s): ACDF - C4-C5 - C5-C6 - C6-C7 (N/A)  SURGEON:  Surgeon(s) and Role:    Earnie Larsson, MD - Primary  PHYSICIAN ASSISTANT:   ASSISTANTSLucia Gaskins   ANESTHESIA:   general  EBL:  200 mL   BLOOD ADMINISTERED:none  DRAINS: (med) Hemovact drain(s) in the prevertebral space with  Suction Open   LOCAL MEDICATIONS USED:  NONE  SPECIMEN:  No Specimen  DISPOSITION OF SPECIMEN:  N/A  COUNTS:  YES  TOURNIQUET:  * No tourniquets in log *  DICTATION: .Dragon Dictation  PLAN OF CARE: Admit to inpatient   PATIENT DISPOSITION:  PACU - hemodynamically stable.   Delay start of Pharmacological VTE agent (>24hrs) due to surgical blood loss or risk of bleeding: yes

## 2021-10-27 ENCOUNTER — Encounter (HOSPITAL_COMMUNITY): Payer: Self-pay | Admitting: Neurosurgery

## 2021-10-27 LAB — GLUCOSE, CAPILLARY: Glucose-Capillary: 121 mg/dL — ABNORMAL HIGH (ref 70–99)

## 2021-10-27 MED ORDER — OXYCODONE-ACETAMINOPHEN 5-325 MG PO TABS: 1.0000 | ORAL_TABLET | ORAL | 0 refills | Status: DC | PRN

## 2021-10-27 NOTE — TOC Progression Note (Signed)
Transition of Care [facility]) - Progression Note    Patient Details  Name: Kara Kelley MRN: [id number] Date of Birth: November 16, 1955  Transition of Care [facility]) CM/SW Contact  Brecklyn Galvis, Adria Devon, RN Phone Number: 10/27/2021, 10:28 AM  Clinical Narrative:       Transition of Care [facility]) Screening Note   Patient Details  Name: Kara Kelley Date of Birth: [birth year]   Transition of Care Department [facility]) has reviewed patient and no TOC needs have been identified at this time. We will continue to monitor patient advancement through interdisciplinary progression rounds. If new patient transition needs arise, please place a TOC consult.        Expected Discharge Plan and Services           Expected Discharge Date: 10/27/21                                     Social Determinants of Health (SDOH) Interventions    Readmission Risk Interventions No flowsheet data found.

## 2021-10-27 NOTE — Evaluation (Signed)
Physical Therapy Evaluation Patient Details Name: Kara Kelley MRN: [id number] DOB: Sep 30, 1956 Today's Date: 10/27/2021  History of Present Illness  66 y.o. female presents to [facility] hospital on 10/26/2021 with severe multilevel cervical disc degeneration with spondylosis and stenosis. Pt underwent C4-7 ACDF on 10/26/2021. PMH includes anxiety, OA, COPD, depression, HTN, TB.  Clinical Impression  Pt presents to PT with deficits in activity tolerance, balance, gait, functional mobility, power. Pt demonstrates increased sway when ambulating and reports a history of imbalance. PT recommends use of SPC for all out of bed mobility to improve stability at the time of discharge. Pt will benefit from frequent mobilization to aide in a return to independence.       Recommendations for follow up therapy are one component of a multi-disciplinary discharge planning process, led by the attending physician.  Recommendations may be updated based on patient status, additional functional criteria and insurance authorization.  Follow Up Recommendations No PT follow up    Assistance Recommended at Discharge Intermittent Supervision/Assistance  Patient can return home with the following  A little help with walking and/or transfers;Assistance with cooking/housework;A little help with bathing/dressing/bathroom    Equipment Recommendations None recommended by PT (pt owns [facility])  Recommendations for Other Services       Functional Status Assessment Patient has had a recent decline in their functional status and demonstrates the ability to make significant improvements in function in a reasonable and predictable amount of time.     Precautions / Restrictions Precautions Precautions: Fall;Cervical Precaution Booklet Issued: Yes (comment) Required Braces or Orthoses: Cervical Brace Cervical Brace: Soft collar Restrictions Weight Bearing Restrictions: No      Mobility  Bed Mobility Overal bed mobility: Needs  Assistance Bed Mobility: Rolling, Sidelying to Sit, Sit to Sidelying Rolling: Min assist Sidelying to sit: Min assist     Sit to sidelying: Min assist      Transfers Overall transfer level: Needs assistance Equipment used: None Transfers: Sit to/from Stand Sit to Stand: Supervision                Ambulation/Gait Ambulation/Gait assistance: Supervision Gait Distance (Feet): 120 Feet Assistive device: None Gait Pattern/deviations: Step-through pattern Gait velocity: reduced Gait velocity interpretation: <1.8 ft/sec, indicate of risk for recurrent falls   General Gait Details: pt with slowed step-through gait, increased lateral sway  Stairs Stairs: Yes Stairs assistance: Min guard Stair Management: One rail Left, Step to pattern Number of Stairs: 1    Wheelchair Mobility    Modified Rankin (Stroke Patients Only)       Balance Overall balance assessment: Needs assistance Sitting-balance support: No upper extremity supported, Feet supported Sitting balance-Leahy Scale: Good     Standing balance support: No upper extremity supported, During functional activity Standing balance-Leahy Scale: Fair                               Pertinent Vitals/Pain Pain Assessment Pain Assessment: 0-10 Pain Score: 10-Worst pain ever Pain Location: neck and entire upper body Pain Descriptors / Indicators: Sore Pain Intervention(s): Monitored during session    Home Living Family/patient expects to be discharged to:: Private residence Living Arrangements: Alone (dtr to stay 24/7 initially) Available Help at Discharge: Family;Available 24 hours/day Type of Home: Apartment Home Access: Stairs to enter Entrance Stairs-Rails: None Entrance Stairs-Number of Steps: 1   Home Layout: One level Home Equipment: Cane - single point;Rollator (4 wheels) Additional Comments: pt's daughter from [place]  is coming to stay with pt at d/c    Prior Function Prior Level of  Function : Independent/Modified Independent             Mobility Comments: pt reports PRN use of SPC ADLs Comments: indep, has license but does not drive     Hand Dominance   Dominant Hand: Right    Extremity/Trunk Assessment   Upper Extremity Assessment Upper Extremity Assessment: Defer to OT evaluation LUE Deficits / Details: ROM WFL, pt reports new pain/soreness LUE Sensation: WNL LUE Coordination: decreased fine motor    Lower Extremity Assessment Lower Extremity Assessment: Overall WFL for tasks assessed    Cervical / Trunk Assessment Cervical / Trunk Assessment: Neck Surgery  Communication   Communication: No difficulties  Cognition Arousal/Alertness: Awake/alert Behavior During Therapy: WFL for tasks assessed/performed, Restless Overall Cognitive Status: Impaired/Different from baseline Area of Impairment: Memory                     Memory: Decreased short-term memory                  General Comments General comments (skin integrity, edema, etc.): VSS on RA    Exercises     Assessment/Plan    PT Assessment Patient needs continued PT services  PT Problem List Decreased balance;Decreased activity tolerance;Decreased mobility;Decreased knowledge of precautions       PT Treatment Interventions DME instruction;Gait training;Stair training;Functional mobility training;Therapeutic activities;Therapeutic exercise;Neuromuscular re-education;Balance training;Patient/family education    PT Goals (Current goals can be found in the Care Plan section)  Acute Rehab PT Goals Patient Stated Goal: to go home PT Goal Formulation: With patient Time For Goal Achievement: 11/10/21 Potential to Achieve Goals: Good    Frequency Min 5X/week     Co-evaluation               AM-PAC PT "6 Clicks" Mobility  Outcome Measure Help needed turning from your back to your side while in a flat bed without using bedrails?: A Little Help needed moving from  lying on your back to sitting on the side of a flat bed without using bedrails?: A Little Help needed moving to and from a bed to a chair (including a wheelchair)?: A Little Help needed standing up from a chair using your arms (e.g., wheelchair or bedside chair)?: A Little Help needed to walk in hospital room?: A Little Help needed climbing 3-5 steps with a railing? : A Little 6 Click Score: 18    End of Session Equipment Utilized During Treatment: Cervical collar Activity Tolerance: Patient tolerated treatment well Patient left: in bed;with call bell/phone within reach;with family/visitor present Nurse Communication: Mobility status PT Visit Diagnosis: Unsteadiness on feet (R26.81)    Time: 3382-5053 PT Time Calculation (min) (ACUTE ONLY): 17 min   Charges:   PT Evaluation $PT Eval Low Complexity: 1 Low          Arlyss Gandy, PT, DPT Acute Rehabilitation Pager: [phone] Office [phone]   Arlyss Gandy 10/27/2021, 9:33 AM

## 2021-10-27 NOTE — Progress Notes (Signed)
Patient alert and oriented, mae's well, voiding adequate amount of urine, swallowing without difficulty, no c/o pain at time of discharge. Patient discharged home with family. Script and discharged instructions given to patient. Patient and family stated understanding of instructions given. Patient has an appointment with Dr. Pool in 2 weeks 

## 2021-10-27 NOTE — Anesthesia Postprocedure Evaluation (Signed)
Anesthesia Post Note  Patient: Kara Kelley  Procedure(s) Performed: ACDF - C4-C5 - C5-C6 - C6-C7 (Neck)     Patient location during evaluation: PACU Anesthesia Type: General Level of consciousness: sedated and patient cooperative Pain management: pain level controlled Vital Signs Assessment: post-procedure vital signs reviewed and stable Respiratory status: spontaneous breathing Cardiovascular status: stable Anesthetic complications: no   No notable events documented.  Last Vitals:  Vitals:   10/27/21 0728 10/27/21 0729  BP: (!) 113/52   Pulse: (!) 112 (!) 110  Resp: 16   Temp: 37.6 C   SpO2: 95% 95%    Last Pain:  Vitals:   10/27/21 0728  TempSrc: Oral  PainSc:                  [name]

## 2021-10-27 NOTE — Discharge Summary (Signed)
Physician Discharge Summary  Patient ID: Kara Kelley MRN: [id number] DOB/AGE: 02-10-1956 66 y.o.  Admit date: 10/26/2021 Discharge date: 10/27/2021  Admission Diagnoses:  Discharge Diagnoses:  Principal Problem:   Cervical spondylosis with myelopathy and radiculopathy   Discharged Condition: good  Hospital Course: Patient admitted to the hospital where she underwent uncomplicated 3 level anterior cervical decompression and fusion surgery.  Postoperatively doing well.  Preoperative neck and upper extremity symptoms improved.  Swallowing well.  Voice strong.  Ambulating without difficulty.  Ready for discharge home.  Consults:   Significant Diagnostic Studies:   Treatments:   Discharge Exam: Blood pressure (!) 113/52, pulse (!) 110, temperature 99.7 F (37.6 C), temperature source Oral, resp. rate 16, height _0  (1.499 m), weight 85.3 kg, SpO2 95 %. Awake and alert.  Oriented and appropriate.  Motor and sensory function intact.  Wound clean and dry.  Chest and abdomen benign.  Disposition: Discharge disposition: 01-Home or Self Care        Allergies as of 10/27/2021   No Known Allergies      Medication List     TAKE these medications    Accu-Chek FastClix Lancets Misc Use as directed to check blood sugars 2 times per day dx: e11.65   Accu-Chek Softclix Lancets lancets Use as instructed to check blood sugars twice daily E11.69   Accu-Chek Guide test strip Generic drug: glucose blood Use as directed to check blood sugars 2 times per day dx: e11.69   Accu-Chek Nano SmartView w/Device Kit Use as directed to check blood sugars 2 times per day dx: e11.65   Accu-Chek Guide Me w/Device Kit Use to check blood sugars twice daily E11.69   albuterol 108 (90 Base) MCG/ACT inhaler Commonly known as: VENTOLIN HFA Inhale 2 puffs into the lungs every 6 (six) hours as needed for wheezing or shortness of breath.   ARIPiprazole 10 MG tablet Commonly known as:  ABILIFY Take 10 mg by mouth daily.   BD Pen Needle Nano 2nd Gen 32G X 4 MM Misc Generic drug: Insulin Pen Needle USE THREE TIMES DAILY AS DIRECTED   buPROPion 300 MG 24 hr tablet Commonly known as: WELLBUTRIN XL Take 1 tablet (300 mg total) by mouth daily.   cyclobenzaprine 5 MG tablet Commonly known as: FLEXERIL TAKE 1 TABLET(5 MG) BY MOUTH THREE TIMES DAILY AS NEEDED FOR MUSCLE SPASMS   gabapentin 300 MG capsule Commonly known as: NEURONTIN TAKE 1 CAPSULE(300 MG) BY MOUTH THREE TIMES DAILY AS NEEDED FOR PAIN Strength: 300 mg   Gvoke HypoPen 2-Pack 1 MG/0.2ML Soaj Generic drug: Glucagon Inject 1 each into the skin as needed. If blood sugar less than 60   hydrOXYzine 25 MG tablet Commonly known as: ATARAX Take 1 tablet (25 mg total) by mouth every 8 (eight) hours as needed for anxiety.   Levemir FlexTouch 100 UNIT/ML FlexPen Generic drug: insulin detemir Inject 30 Units into the skin at bedtime.   Linzess 145 MCG Caps capsule Generic drug: linaclotide Take 145 mcg by mouth daily before breakfast.   lisinopril 10 MG tablet Commonly known as: ZESTRIL TAKE 1 TABLET(10 MG) BY MOUTH DAILY   meloxicam 15 MG tablet Commonly known as: MOBIC Take 15 mg by mouth daily as needed for pain.   multivitamin with minerals tablet Take 1 tablet by mouth daily. 50 +   NovoLOG FlexPen 100 UNIT/ML FlexPen Generic drug: insulin aspart Inject 18 Units into the skin 3 (three) times daily with meals.   omeprazole 40 MG  capsule Commonly known as: PRILOSEC TAKE 1 CAPSULE(40 MG) BY MOUTH DAILY BEFORE BREAKFAST   ondansetron 4 MG tablet Commonly known as: Zofran Take 1 tablet (4 mg total) by mouth 3 (three) times daily as needed for nausea or vomiting.   oxyCODONE-acetaminophen 5-325 MG tablet Commonly known as: Percocet Take 1 tablet by mouth every 4 (four) hours as needed.   Ozempic (1 MG/DOSE) 4 MG/3ML Sopn Generic drug: Semaglutide (1 MG/DOSE) Inject 1 mg into the skin once a  week.   promethazine 25 MG tablet Commonly known as: PHENERGAN Take 1 tablet (25 mg total) by mouth every 8 (eight) hours as needed for up to 3 days for nausea or vomiting.   spironolactone 50 MG tablet Commonly known as: ALDACTONE TAKE 1 TABLET(50 MG) BY MOUTH DAILY   traZODone 150 MG tablet Commonly known as: DESYREL Take 150 mg by mouth at bedtime.        Follow-up Information     Earnie Larsson, MD. Call.   Specialty: Neurosurgery Why: As needed, If symptoms worsen Contact information: [address] [place] [address] [phone]                 Signed: Charlie Pitter 10/27/2021, 9:56 AM

## 2021-10-27 NOTE — Discharge Instructions (Signed)
Wound Care Keep incision area dry.  You may remove outer bandage after 2 days and shower.   Do not put any creams, lotions, or ointments on incision. Leave steri-strips on neck.  They will fall off by themselves. Activity Walk each and every day, increasing distance each day. No lifting greater than 5 lbs.  Avoid excessive neck motion. No driving for 2 weeks; may ride as a passenger locally. Wear neck brace at all times except when showering. Diet Resume your normal diet.  Return to Work Will be discussed at you follow up appointment. Call Your Doctor If Any of These Occur Redness, drainage, or swelling at the wound.  Temperature greater than 101 degrees. Severe pain not relieved by pain medication. Increased difficulty swallowing.  Incision starts to come apart. Follow Up Appt Call  [phone])  for problems.  If you have any hardware placed in your spine, you will need an x-ray before your appointment.  

## 2021-10-27 NOTE — Evaluation (Signed)
Occupational Therapy Evaluation Patient Details Name: Kara Kelley MRN: [id number] DOB: July 13, 1956 Today's Date: 10/27/2021   History of Present Illness 66 y.o. female presents to [facility] hospital on 10/26/2021 with severe multilevel cervical disc degeneration with spondylosis and stenosis. Pt underwent C4-7 ACDF on 10/26/2021. PMH includes anxiety, OA, COPD, depression, HTN, TB.   Clinical Impression   Bety reports being indep PTA. She lives alone in an apartment with 3 STE, and her daughter from [place] is in town to stay with the pt at d/c. Pt verbalized understanding of cervical precautions, and benefited from verbal cues throughout to maintain during ADLs. Overall pt was supervision- min G for ADLs, bed mobility and ambulation without AD. Pt was observed with restless movements with sudden jerky movements of both UEs and LEs. Pt does not require further OT acutely. Recommend d/c to home without follow up OT.     Recommendations for follow up therapy are one component of a multi-disciplinary discharge planning process, led by the attending physician.  Recommendations may be updated based on patient status, additional functional criteria and insurance authorization.   Follow Up Recommendations  No OT follow up    Assistance Recommended at Discharge Frequent or constant Supervision/Assistance  Patient can return home with the following A little help with walking and/or transfers;A little help with bathing/dressing/bathroom;Help with stairs or ramp for entrance;Assist for transportation;Assistance with cooking/housework    Functional Status Assessment  Patient has had a recent decline in their functional status and demonstrates the ability to make significant improvements in function in a reasonable and predictable amount of time.  Equipment Recommendations  None recommended by OT    Recommendations for Other Services       Precautions / Restrictions Precautions Precautions:  Fall;Cervical Precaution Booklet Issued: Yes (comment) Required Braces or Orthoses: Cervical Brace Cervical Brace: Soft collar Restrictions Weight Bearing Restrictions: No      Mobility Bed Mobility Overal bed mobility: Needs Assistance Bed Mobility: Rolling, Sidelying to Sit, Sit to Sidelying Rolling: Supervision Sidelying to sit: Supervision     Sit to sidelying: Supervision General bed mobility comments: verbal cues for log roll    Transfers Overall transfer level: Needs assistance Equipment used: None Transfers: Sit to/from Stand Sit to Stand: Min guard           General transfer comment: for safety only      Balance Overall balance assessment: Needs assistance Sitting-balance support: Feet supported Sitting balance-Leahy Scale: Good     Standing balance support: No upper extremity supported, During functional activity Standing balance-Leahy Scale: Fair                             ADL either performed or assessed with clinical judgement   ADL Overall ADL's : Needs assistance/impaired Eating/Feeding: Sitting;Supervision/ safety;Cueing for compensatory techinques   Grooming: Supervision/safety;Cueing for compensatory techniques;Sitting   Upper Body Bathing: Supervision/ safety;Cueing for compensatory techniques;Sitting   Lower Body Bathing: Min guard;Cueing for compensatory techniques;Sit to/from stand   Upper Body Dressing : Supervision/safety;Cueing for compensatory techniques;Sitting   Lower Body Dressing: Supervision/safety;Cueing for compensatory techniques;Sit to/from stand   Toilet Transfer: Min guard;Ambulation   Toileting- Clothing Manipulation and Hygiene: Supervision/safety;Cueing for compensatory techniques;Sitting/lateral lean       Functional mobility during ADLs: Min guard General ADL Comments: reviewed cervical precautions, and cued for compensatory techniques to adhere to precautions during ADLs     Vision Baseline  Vision/History: 1 Wears glasses Ability to  See in Adequate Light: 0 Adequate Patient Visual Report: No change from baseline Vision Assessment?: No apparent visual deficits            Pertinent Vitals/Pain Pain Assessment Pain Assessment: Faces Pain Score: 10-Worst pain ever Faces Pain Scale: Hurts little more Pain Location: neck & LUE Pain Descriptors / Indicators: Discomfort Pain Intervention(s): Limited activity within patient's tolerance, Monitored during session     Hand Dominance Right   Extremity/Trunk Assessment Upper Extremity Assessment Upper Extremity Assessment: LUE deficits/detail LUE Deficits / Details: ROM WFL, pt reports new pain/soreness LUE Sensation: WNL LUE Coordination: decreased fine motor   Lower Extremity Assessment Lower Extremity Assessment: Defer to PT evaluation   Cervical / Trunk Assessment Cervical / Trunk Assessment: Neck Surgery   Communication Communication Communication: No difficulties   Cognition Arousal/Alertness: Awake/alert Behavior During Therapy: WFL for tasks assessed/performed, Restless Overall Cognitive Status: Within Functional Limits for tasks assessed         General Comments: pt with restles arm and leg movements throughout the entrie session     General Comments  VSS on RA, no family present            Home Living Family/patient expects to be discharged to:: Private residence Living Arrangements: Alone Available Help at Discharge: Family;Available 24 hours/day Type of Home: Apartment Home Access: Stairs to enter Entrance Stairs-Number of Steps: 3 Entrance Stairs-Rails: None Home Layout: One level     Bathroom Shower/Tub: Chief Strategy Officer: Handicapped height     Home Equipment: Medical laboratory scientific officer - single point;Rollator (4 wheels)   Additional Comments: pt's daughter from [place] is coming to stay with pt at d/c      Prior Functioning/Environment Prior Level of Function : Independent/Modified  Independent             Mobility Comments: no AD ADLs Comments: indep, has license but does not drive        OT Problem List: Decreased strength;Decreased activity tolerance;Decreased range of motion;Impaired balance (sitting and/or standing);Decreased safety awareness;Decreased knowledge of use of DME or AE;Decreased knowledge of precautions;Pain      OT Treatment/Interventions: Self-care/ADL training;Therapeutic exercise;Patient/family education;Balance training;DME and/or AE instruction;Therapeutic activities    OT Goals(Current goals can be found in the care plan section) Acute Rehab OT Goals Patient Stated Goal: home OT Goal Formulation: All assessment and education complete, DC therapy Time For Goal Achievement: 10/27/21  OT Frequency: Min 2X/week       AM-PAC OT "6 Clicks" Daily Activity     Outcome Measure Help from another person eating meals?: A Little Help from another person taking care of personal grooming?: A Little Help from another person toileting, which includes using toliet, bedpan, or urinal?: A Little Help from another person bathing (including washing, rinsing, drying)?: A Little Help from another person to put on and taking off regular upper body clothing?: A Little Help from another person to put on and taking off regular lower body clothing?: A Little 6 Click Score: 18   End of Session Equipment Utilized During Treatment: Cervical collar Nurse Communication: Mobility status  Activity Tolerance: Patient tolerated treatment well Patient left: in bed;with call bell/phone within reach  OT Visit Diagnosis: Unsteadiness on feet (R26.81);Other abnormalities of gait and mobility (R26.89);Pain;Muscle weakness (generalized) (M62.81)                Time: 6578-4696 OT Time Calculation (min): 17 min Charges:  OT General Charges $OT Visit: 1 Visit OT Evaluation $OT Eval Moderate Complexity:  1 Mod   Dwane Andres A Noelia Lenart 10/27/2021, 9:10 AM

## 2021-10-27 NOTE — Progress Notes (Signed)
Inpatient Diabetes Program Recommendations  AACE/ADA: New Consensus Statement on Inpatient Glycemic Control (2015)  Target Ranges:  Prepandial:   less than 140 mg/dL      Peak postprandial:   less than 180 mg/dL (1-2 hours)      Critically ill patients:  140 - 180 mg/dL   Lab Results  Component Value Date   GLUCAP 121 (H) 10/27/2021   HGBA1C 6.9 (H) 10/19/2021    Review of Glycemic Control  Latest Reference Range & Units 10/26/21 16:54 10/26/21 21:10 10/27/21 06:07  Glucose-Capillary 70 - 99 mg/dL 672 (H) 094 (H) 709 (H)  (H): Data is abnormally high Diabetes history: Type 2 DM Outpatient Diabetes medications: Levemir 30 units QHS, Novolog 18 units TID, Ozempic 1 mg qwk Current orders for Inpatient glycemic control: Levemir 30 units QHS, Novolog 18 units TID Decadron 10 mg x 1 Inpatient Diabetes Program Recommendations:    Consider adding Novolog 0-9 units TID & HS.   Thanks, Lujean Rave, MSN, RNC-OB Diabetes Coordinator [phone] (8a-5p)

## 2021-10-29 ENCOUNTER — Other Ambulatory Visit: Payer: Self-pay

## 2021-10-29 ENCOUNTER — Emergency Department (HOSPITAL_COMMUNITY): Payer: Medicaid Other

## 2021-10-29 ENCOUNTER — Encounter (HOSPITAL_COMMUNITY): Payer: Self-pay

## 2021-10-29 ENCOUNTER — Emergency Department (HOSPITAL_COMMUNITY)
Admission: EM | Admit: 2021-10-29 | Discharge: 2021-10-29 | Disposition: A | Discharge status: Home / Self Care | Payer: Medicaid Other | Attending: Emergency Medicine | Admitting: Emergency Medicine

## 2021-10-29 DIAGNOSIS — W07XXXA Fall from chair, initial encounter: Secondary | ICD-10-CM | POA: Insufficient documentation

## 2021-10-29 DIAGNOSIS — M79602 Pain in left arm: Secondary | ICD-10-CM | POA: Diagnosis not present

## 2021-10-29 DIAGNOSIS — Z5321 Procedure and treatment not carried out due to patient leaving prior to being seen by health care provider: Secondary | ICD-10-CM | POA: Insufficient documentation

## 2021-10-29 DIAGNOSIS — S0990XA Unspecified injury of head, initial encounter: Secondary | ICD-10-CM | POA: Insufficient documentation

## 2021-10-29 NOTE — ED Provider Triage Note (Signed)
Emergency Medicine Provider Triage Evaluation Note  Kara Kelley , a 66 y.o. female  was evaluated in triage.  Pt complains of fall.  Underwent C4-C7 anterior cervical discectomy with fusion on 10/26/2021.  She is currently in a soft collar.  Was using stool to get her bed tonight when she lost her footing and fell backwards, fell flat on her back on hardwood floor.  She did hit her head but denies loss of consciousness.  She has worsening pain in her neck and upper back between the shoulder blades.  She denies any focal numbness or tingling.  She is not currently on anticoagulation..  Review of Systems  Positive: Neck and back pain Negative: Numbness, tingling  Physical Exam  BP (!) 146/73 (BP Location: Right Arm)    Pulse (!) 106    Temp 98.3 F (36.8 C) (Oral)    Resp 17    SpO2 98%  Gen:   Awake, no distress   Resp:  Normal effort  MSK:   Moves extremities without difficulty  Other:  Soft collar in place, mild tenderness along lower cervical and upper thoracic spine, no acute deformity, normal strength and sensation BUE  Medical Decision Making  Medically screening exam initiated at 3:17 AM.  Appropriate orders placed.  TRUDA FOLAND was informed that the remainder of the evaluation will be completed by another provider, this initial triage assessment does not replace that evaluation, and the importance of remaining in the ED until their evaluation is complete.  Fall, neck and back pain.  Recent neck surgery 10/26/21 with Dr. Annette Stable.  Currently in collar.  AAOx3 without focal deficits at present.  Will obtain ct head/neck and thoracic spine films.   Larene Pickett, PA-C 10/29/21 [phone]

## 2021-10-29 NOTE — ED Triage Notes (Signed)
Pt presents to the ED from home via [facility] with complaints of a fall. Pt states she was getting into her bed and stepped up onto a step stool and slipped. Pt landed on her back on hard wood floor. Pt also just had neck surgery on 10/26/21 and has a soft neck brace in place. Pt states she has had left arm pain since the surgery. Pt states she hit the back of her hed but denies LOC.

## 2021-10-29 NOTE — ED Notes (Signed)
Called x 1 NO answer 

## 2021-11-02 ENCOUNTER — Ambulatory Visit: Payer: Medicaid Other | Admitting: Nurse Practitioner

## 2021-11-11 ENCOUNTER — Other Ambulatory Visit: Payer: Self-pay | Admitting: Nurse Practitioner
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
## 2021-11-15 ENCOUNTER — Ambulatory Visit (INDEPENDENT_AMBULATORY_CARE_PROVIDER_SITE_OTHER): Payer: Medicaid Other | Admitting: Family

## 2021-11-15 ENCOUNTER — Other Ambulatory Visit: Payer: Self-pay

## 2021-11-15 ENCOUNTER — Encounter: Payer: Self-pay | Admitting: Family

## 2021-11-15 VITALS — BP 126/78 | HR 109 | Temp 98.3°F | Resp 18 | Ht 60.0 in | Wt 178.0 lb

## 2021-11-15 DIAGNOSIS — Z1211 Encounter for screening for malignant neoplasm of colon: Secondary | ICD-10-CM | POA: Diagnosis not present

## 2021-11-15 DIAGNOSIS — M4722 Other spondylosis with radiculopathy, cervical region: Secondary | ICD-10-CM | POA: Diagnosis not present

## 2021-11-15 DIAGNOSIS — M4712 Other spondylosis with myelopathy, cervical region: Secondary | ICD-10-CM

## 2021-11-15 DIAGNOSIS — Z7689 Persons encountering health services in other specified circumstances: Secondary | ICD-10-CM | POA: Diagnosis not present

## 2021-11-15 NOTE — Progress Notes (Signed)
Pt presents to establish care, accompanied by daughter Jonelle Sidle pt states that she keeps falling, her balance is off, pt does have neurology

## 2021-11-15 NOTE — Patient Instructions (Signed)
Thank you for choosing Primary Care at [facility] for your medical home!    Kara Kelley was seen by Rema Fendt, NP today.   Kara Kelley's primary care provider is Ricky Stabs, NP.   For the best care possible,  you should try to see Ricky Stabs, NP whenever you come to clinic.   We look forward to seeing you again soon!  If you have any questions about your visit today,  please call us at [phone]  Or feel free to reach your provider via MyChart.    Keeping you healthy   Get these tests Blood pressure- Have your blood pressure checked once a year by your healthcare provider.  Normal blood pressure is 120/80. Weight- Have your body mass index (BMI) calculated to screen for obesity.  BMI is a measure of body fat based on height and weight. You can also calculate your own BMI at https://www.west-esparza.com/. Cholesterol- Have your cholesterol checked regularly starting at age 17, sooner may be necessary if you have diabetes, high blood pressure, if a family member developed heart diseases at an early age or if you smoke.  Chlamydia, HIV, and other sexual transmitted disease- Get screened each year until the age of 62 then within three months of each new sexual partner. Diabetes- Have your blood sugar checked regularly if you have high blood pressure, high cholesterol, a family history of diabetes or if you are overweight.   Get these vaccines Flu shot- Every fall. Tetanus shot- Every 10 years. Menactra- Single dose; prevents meningitis.   Take these steps Don't smoke- If you do smoke, ask your healthcare provider about quitting. For tips on how to quit, go to www.smokefree.gov or call 1-800-QUIT-NOW. Be physically active- Exercise 5 days a week for at least 30 minutes.  If you are not already physically active start slow and gradually work up to 30 minutes of moderate physical activity.  Examples of moderate activity include walking briskly, mowing the yard, dancing,  swimming bicycling, etc. Eat a healthy diet- Eat a variety of healthy foods such as fruits, vegetables, low fat milk, low fat cheese, yogurt, lean meats, poultry, fish, beans, tofu, etc.  For more information on healthy eating, go to www.thenutritionsource.org Drink alcohol in moderation- Limit alcohol intake two drinks or less a day.  Never drink and drive. Dentist- Brush and floss teeth twice daily; visit your dentis twice a year. Depression-Your emotional health is as important as your physical health.  If you're feeling down, losing interest in things you normally enjoy please talk with your healthcare provider. Gun Safety- If you keep a gun in your home, keep it unloaded and with the safety lock on.  Bullets should be stored separately. Helmet use- Always wear a helmet when riding a motorcycle, bicycle, rollerblading or skateboarding. Safe sex- If you may be exposed to a sexually transmitted infection, use a condom Seat belts- Seat bels can save your life; always wear one. Smoke/Carbon Monoxide detectors- These detectors need to be installed on the appropriate level of your home.  Replace batteries at least once a year. Skin Cancer- When out in the sun, cover up and use sunscreen SPF 15 or higher. Violence- If anyone is threatening or hurting you, please tell your healthcare provider.

## 2021-11-16 ENCOUNTER — Ambulatory Visit: Payer: Medicaid Other | Admitting: Family

## 2021-11-16 DIAGNOSIS — Z7689 Persons encountering health services in other specified circumstances: Secondary | ICD-10-CM

## 2021-11-23 ENCOUNTER — Ambulatory Visit: Payer: Medicaid Other | Admitting: Nurse Practitioner

## 2021-11-28 NOTE — Progress Notes (Unsigned)
Name: Kara Kelley  MRN/ DOB: [id number], 1956/01/13   Age/ Sex: 66 y.o., female    PCP: Rema Fendt, NP   Reason for Endocrinology Evaluation: Type 2 Diabetes Mellitus     Date of Initial Endocrinology Visit: 11/28/2021     PATIENT IDENTIFIER: Kara Kelley is a 66 y.o. female with a past medical history of T2DM, HTN,COPD, hx of cocaine use . The patient presented for initial endocrinology clinic visit on 11/28/2021 for consultative assistance with her diabetes management.    HPI: Kara Kelley was    Diagnosed with DM 2012 Prior Medications tried/Intolerance: *** Currently checking blood sugars *** x / day,  before breakfast and ***.  Hypoglycemia episodes : ***               Symptoms: ***                 Frequency: ***/  Hemoglobin A1c has ranged from 6.9% in 2023, peaking at 11.7% in 2015. Patient required assistance for hypoglycemia:  Patient has required hospitalization within the last 1 year from hyper or hypoglycemia:   In terms of diet, the patient ***   HOME DIABETES REGIMEN: Levemir  Novolog  Ozempic    Statin: {Yes/No:11203} ACE-I/ARB: yes Prior Diabetic Education: {Yes/No:11203}   METER DOWNLOAD SUMMARY: Date range evaluated: *** Fingerstick Blood Glucose Tests = *** Average Number Tests/Day = *** Overall Mean FS Glucose = *** Standard Deviation = ***  BG Ranges: Low = *** High = ***   Hypoglycemic Events/30 Days: BG < 50 = *** Episodes of symptomatic severe hypoglycemia = ***   DIABETIC COMPLICATIONS: Microvascular complications:  CKD III, 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, neuropathy  Last eye exam: Completed   Macrovascular complications:   Denies: CAD, PVD, CVA   PAST HISTORY: Past Medical History:  Past Medical History:  Diagnosis Date   Anemia    Anxiety    Arthritis    Asthma    Bunion    Callus    Chronic pain    Cocaine abuse (HCC)    COPD (chronic obstructive pulmonary disease) (HCC)    Corns and callosities     Degenerative joint disease    Depression    Diabetes mellitus    Endometrial polyp    ETOH abuse    Gall stones    GERD (gastroesophageal reflux disease)    Headache    history of Migraines   Hepatitis C    Hep C   Hyperlipidemia    Hypertension    Insomnia    Spondylolisthesis of lumbar region    Substance abuse (HCC)    alcoholism   Tuberculosis 1985   Wears dentures    Wears glasses    Past Surgical History:  Past Surgical History:  Procedure Laterality Date   ANTERIOR CERVICAL DECOMP/DISCECTOMY FUSION N/A 10/26/2021   Procedure: ACDF - C4-C5 - C5-C6 - C6-C7;  Surgeon: Julio Sicks, MD;  Location: [facility];  Service: Neurosurgery;  Laterality: N/A;   BACK SURGERY  2018   CESAREAN SECTION     x3   CHOLECYSTECTOMY N/A 08/11/2016   Procedure: LAPAROSCOPIC CHOLECYSTECTOMY WITH   INTRAOPERATIVE CHOLANGIOGRAM;  Surgeon: Almond Lint, MD;  Location: [facility];  Service: General;  Laterality: N/A;   COLONOSCOPY     Cotton Osteotomy w/ Graft Left 06/18/2009   Excision of Benign Lesion Right 01/30/2013   Rt Plantar   FOOT SURGERY     HAMMER TOE REPAIR Right 02/12/2016   RIGHT #  5   Hammertoe Repair Left 06/18/2009   Lt #5   HYSTEROSCOPY N/A 06/20/2017   Procedure: DILATION AND CURETTAGE, HYSTEROSCOPY w/ Polypectomy;  Surgeon: Willodean Rosenthal, MD;  Location: [facility];  Service: Gynecology;  Laterality: N/A;   LUMBAR FUSION  2018   METATARSAL OSTEOTOMY Left 06/18/2009   #5   MULTIPLE TOOTH EXTRACTIONS     Nail Matrixectomy Left 06/18/2009   LT #1   OSTEOTOMY Right 01/30/2013   Rt #5   Phalangectomy Left 06/18/2009   LT #1   Phalangectomy Right 01/30/2013   Rt #1   TUBAL LIGATION      Social History:  reports that she quit smoking about 3 years ago. Her smoking use included cigarettes. She smoked an average of .5 packs per day. She has never used smokeless tobacco. She reports that she does not drink alcohol and does not use drugs. Family History:  Family History  Problem Relation  Age of Onset   Heart disease Mother    Diabetes Other        mat great aunt   Cirrhosis Other        mat great aunt   Cirrhosis Other        mat great uncles x 2   Colon cancer Neg Hx    Breast cancer Neg Hx      HOME MEDICATIONS: Allergies as of 11/29/2021   No Known Allergies      Medication List        Accurate as of November 28, 2021  8:41 PM. If you have any questions, ask your nurse or doctor.          Accu-Chek FastClix Lancets Misc Use as directed to check blood sugars 2 times per day dx: e11.65   Accu-Chek Softclix Lancets lancets Use as instructed to check blood sugars twice daily E11.69   Accu-Chek Guide test strip Generic drug: glucose blood Use as directed to check blood sugars 2 times per day dx: e11.69   Accu-Chek Nano SmartView w/Device Kit Use as directed to check blood sugars 2 times per day dx: e11.65   Accu-Chek Guide Me w/Device Kit Use to check blood sugars twice daily E11.69   albuterol 108 (90 Base) MCG/ACT inhaler Commonly known as: VENTOLIN HFA Inhale 2 puffs into the lungs every 6 (six) hours as needed for wheezing or shortness of breath.   ARIPiprazole 10 MG tablet Commonly known as: ABILIFY Take 10 mg by mouth daily.   BD Pen Needle Nano 2nd Gen 32G X 4 MM Misc Generic drug: Insulin Pen Needle USE THREE TIMES DAILY AS DIRECTED   buPROPion 300 MG 24 hr tablet Commonly known as: WELLBUTRIN XL Take 1 tablet (300 mg total) by mouth daily.   cyclobenzaprine 5 MG tablet Commonly known as: FLEXERIL TAKE 1 TABLET(5 MG) BY MOUTH THREE TIMES DAILY AS NEEDED FOR MUSCLE SPASMS   gabapentin 300 MG capsule Commonly known as: NEURONTIN TAKE 1 CAPSULE(300 MG) BY MOUTH THREE TIMES DAILY AS NEEDED FOR PAIN Strength: 300 mg   Gvoke HypoPen 2-Pack 1 MG/0.2ML Soaj Generic drug: Glucagon Inject 1 each into the skin as needed. If blood sugar less than 60   hydrOXYzine 25 MG tablet Commonly known as: ATARAX Take 1 tablet (25 mg total) by  mouth every 8 (eight) hours as needed for anxiety.   Levemir FlexTouch 100 UNIT/ML FlexPen Generic drug: insulin detemir Inject 30 Units into the skin at bedtime.   Linzess 145 MCG Caps capsule Generic drug:  linaclotide Take 145 mcg by mouth daily before breakfast.   lisinopril 10 MG tablet Commonly known as: ZESTRIL TAKE 1 TABLET(10 MG) BY MOUTH DAILY   meloxicam 15 MG tablet Commonly known as: MOBIC Take 15 mg by mouth daily as needed for pain.   multivitamin with minerals tablet Take 1 tablet by mouth daily. 50 +   NovoLOG FlexPen 100 UNIT/ML FlexPen Generic drug: insulin aspart Inject 18 Units into the skin 3 (three) times daily with meals.   omeprazole 40 MG capsule Commonly known as: PRILOSEC TAKE 1 CAPSULE(40 MG) BY MOUTH DAILY BEFORE BREAKFAST   ondansetron 4 MG tablet Commonly known as: ZOFRAN TAKE 1 TABLET(4 MG) BY MOUTH THREE TIMES DAILY AS NEEDED FOR NAUSEA OR VOMITING   oxyCODONE-acetaminophen 5-325 MG tablet Commonly known as: Percocet Take 1 tablet by mouth every 4 (four) hours as needed.   Ozempic (1 MG/DOSE) 4 MG/3ML Sopn Generic drug: Semaglutide (1 MG/DOSE) Inject 1 mg into the skin once a week.   promethazine 25 MG tablet Commonly known as: PHENERGAN Take 1 tablet (25 mg total) by mouth every 8 (eight) hours as needed for up to 3 days for nausea or vomiting.   spironolactone 50 MG tablet Commonly known as: ALDACTONE TAKE 1 TABLET(50 MG) BY MOUTH DAILY   traZODone 150 MG tablet Commonly known as: DESYREL Take 150 mg by mouth at bedtime.         ALLERGIES: No Known Allergies   REVIEW OF SYSTEMS: A comprehensive ROS was conducted with the patient and is negative except as per HPI and below:  ROS    OBJECTIVE:   VITAL SIGNS: There were no vitals taken for this visit.   PHYSICAL EXAM:  General: Pt appears well and is in NAD  Hydration: Well-hydrated with moist mucous membranes and good skin turgor  HEENT: Head: Unremarkable with  good dentition. Oropharynx clear without exudate.  Eyes: External eye exam normal without stare, lid lag or exophthalmos.  EOM intact.  PERRL.  Neck: General: Supple without adenopathy or carotid bruits. Thyroid: Thyroid size normal.  No goiter or nodules appreciated. No thyroid bruit.  Lungs: Clear with good BS bilat with no rales, rhonchi, or wheezes  Heart: RRR with normal S1 and S2 and no gallops; no murmurs; no rub  Abdomen: Normoactive bowel sounds, soft, nontender, without masses or organomegaly palpable  Extremities:  Lower extremities - No pretibial edema. No lesions.  Skin: Normal texture and temperature to palpation. No rash noted. No Acanthosis nigricans/skin tags. No lipohypertrophy.  Neuro: MS is good with appropriate affect, pt is alert and Ox3    DM foot exam:    DATA REVIEWED:  Lab Results  Component Value Date   HGBA1C 6.9 (H) 10/19/2021   HGBA1C 6.9 (H) 06/29/2021   HGBA1C 6.8 (H) 11/26/2020   Lab Results  Component Value Date   MICROALBUR 30 06/23/2020   LDLCALC 75 06/29/2021   CREATININE 1.01 (H) 10/19/2021   Lab Results  Component Value Date   MICRALBCREAT 30-300 06/23/2020    Lab Results  Component Value Date   CHOL 141 06/29/2021   HDL 50 06/29/2021   LDLCALC 75 06/29/2021   TRIG 85 06/29/2021   CHOLHDL 2.8 06/29/2021        ASSESSMENT / PLAN / RECOMMENDATIONS:   1) Type 2 Diabetes Mellitus, Poorly controlled, With Neuropathic, retinopathic and KD III complications - Most recent A1c of 8.6 %. Goal A1c < 7.0 %.    Plan: GENERAL: ***  MEDICATIONS: ***  EDUCATION / INSTRUCTIONS: BG monitoring instructions: Patient is instructed to check her blood sugars *** times a day, ***. Call [facility] if: BG persistently < 70 or > 300. I reviewed the Rule of 15 for the treatment of hypoglycemia in detail with the patient. Literature supplied.   2) Diabetic complications:  Eye: Does *** have known diabetic retinopathy.  Neuro/  Feet: Does  have known diabetic peripheral neuropathy. Renal: Patient does  have known baseline CKD. She is  on an ACEI/ARB at present.  3) Lipids: Patient is *** on a statin.          Signed electronically by: Lyndle Herrlich, MD  [facility]  [facility] [address] [place], [place] [address] Phone: [phone] FAX: [phone]   CC: Rema Fendt, NP [address] [place] [address] Phone: [phone]  Fax: [phone]    Return to Endocrinology clinic as below: Future Appointments  Date Time Provider Department Center  11/29/2021  9:50 AM Gloriana Piltz, Konrad Dolores, MD [facility] None  12/01/2021  9:30 AM [facility] None  12/28/2021  8:00 AM Rema Fendt, NP [facility] None

## 2021-11-29 ENCOUNTER — Ambulatory Visit: Payer: Medicaid Other | Admitting: Internal Medicine

## 2021-11-30 ENCOUNTER — Ambulatory Visit: Payer: Medicaid Other | Admitting: Podiatry

## 2021-12-01 ENCOUNTER — Ambulatory Visit (INDEPENDENT_AMBULATORY_CARE_PROVIDER_SITE_OTHER): Payer: Medicaid Other

## 2021-12-01 DIAGNOSIS — Z23 Encounter for immunization: Secondary | ICD-10-CM | POA: Diagnosis not present

## 2021-12-09 ENCOUNTER — Telehealth: Payer: Self-pay

## 2021-12-09 NOTE — Telephone Encounter (Signed)
Greysen would like a call back about home health orders that were faxed over the other day.  ?

## 2021-12-09 NOTE — Progress Notes (Signed)
Virtual Visit via Telephone Note ? ?I connected with Kara Kelley, on 12/10/2021 at 9:00 AM by telephone due to the COVID-19 pandemic and verified that I am speaking with the correct person using two identifiers. ? ?Due to current restrictions/limitations of in-office visits due to the COVID-19 pandemic, this scheduled clinical appointment was converted to a telehealth visit. ?  ?Consent: ?I discussed the limitations, risks, security and privacy concerns of performing an evaluation and management service by telephone and the availability of in person appointments. I also discussed with the patient that there may be a patient responsible charge related to this service. The patient expressed understanding and agreed to proceed. ? ? ?Location of Patient: ?Home ? ?Location of Provider: ?[facility] ? ? ?Persons participating in Telemedicine visit: ?ZERA MARKWARDT ?Durene Fruits, NP ?Elmon Else, [name] ? ? ?History of Present Illness: ?Kara Kelley. Karim is a 66 year-old female who presents for [place] form completion. Requesting home services with activities of daily living. Patient with recent neck surgery 10/26/2021. Followed by Earnie Larsson, MD at [facility].  ? ?  ?Past Medical History:  ?Diagnosis Date  ? Anemia   ? Anxiety   ? Arthritis   ? Asthma   ? Bunion   ? Callus   ? Chronic pain   ? Cocaine abuse ([place])   ? COPD (chronic obstructive pulmonary disease) ([facility])   ? Corns and callosities   ? Degenerative joint disease   ? Depression   ? Diabetes mellitus   ? Endometrial polyp   ? ETOH abuse   ? Gall stones   ? GERD (gastroesophageal reflux disease)   ? Headache   ? history of Migraines  ? Hepatitis C   ? Hep C  ? Hyperlipidemia   ? Hypertension   ? Insomnia   ? Spondylolisthesis of lumbar region   ? Substance abuse ([place])   ? alcoholism  ? Tuberculosis 1985  ? Wears dentures   ? Wears glasses   ? ?No Known Allergies ? ?Current Outpatient  Medications on File Prior to Visit  ?Medication Sig Dispense Refill  ? Accu-Chek FastClix Lancets MISC Use as directed to check blood sugars 2 times per day dx: e11.65 102 each prn  ? Accu-Chek Softclix Lancets lancets Use as instructed to check blood sugars twice daily E11.69 100 each 2  ? albuterol (VENTOLIN HFA) 108 (90 Base) MCG/ACT inhaler Inhale 2 puffs into the lungs every 6 (six) hours as needed for wheezing or shortness of breath. 18 g 2  ? ARIPiprazole (ABILIFY) 10 MG tablet Take 10 mg by mouth daily.     ? BD PEN NEEDLE NANO 2ND GEN 32G X 4 MM MISC USE THREE TIMES DAILY AS DIRECTED 200 each 3  ? Blood Glucose Monitoring Suppl (ACCU-CHEK GUIDE ME) w/Device KIT Use to check blood sugars twice daily E11.69 1 kit 1  ? Blood Glucose Monitoring Suppl (ACCU-CHEK NANO SMARTVIEW) w/Device KIT Use as directed to check blood sugars 2 times per day dx: e11.65 1 kit 0  ? buPROPion (WELLBUTRIN XL) 300 MG 24 hr tablet Take 1 tablet (300 mg total) by mouth daily. 90 tablet 0  ? cyclobenzaprine (FLEXERIL) 5 MG tablet TAKE 1 TABLET(5 MG) BY MOUTH THREE TIMES DAILY AS NEEDED FOR MUSCLE SPASMS 30 tablet 1  ? gabapentin (NEURONTIN) 300 MG capsule TAKE 1 CAPSULE(300 MG) BY MOUTH THREE TIMES DAILY AS NEEDED FOR PAIN ?Strength: 300 mg 180 capsule  0  ? Glucagon (GVOKE HYPOPEN 2-PACK) 1 MG/0.2ML SOAJ Inject 1 each into the skin as needed. If blood sugar less than 60 0.4 mL 3  ? glucose blood (ACCU-CHEK GUIDE) test strip Use as directed to check blood sugars 2 times per day dx: e11.69 300 strip 2  ? hydrOXYzine (ATARAX) 25 MG tablet Take 1 tablet (25 mg total) by mouth every 8 (eight) hours as needed for anxiety. 30 tablet 1  ? insulin detemir (LEVEMIR FLEXTOUCH) 100 UNIT/ML FlexPen Inject 30 Units into the skin at bedtime. 15 mL 5  ? LINZESS 145 MCG CAPS capsule Take 145 mcg by mouth daily before breakfast.     ? lisinopril (ZESTRIL) 10 MG tablet TAKE 1 TABLET(10 MG) BY MOUTH DAILY 90 tablet 0  ? meloxicam (MOBIC) 15 MG tablet  Take 15 mg by mouth daily as needed for pain.    ? Multiple Vitamins-Minerals (MULTIVITAMIN WITH MINERALS) tablet Take 1 tablet by mouth daily. 50 +    ? NOVOLOG FLEXPEN 100 UNIT/ML FlexPen Inject 18 Units into the skin 3 (three) times daily with meals. 15 mL 3  ? omeprazole (PRILOSEC) 40 MG capsule TAKE 1 CAPSULE(40 MG) BY MOUTH DAILY BEFORE BREAKFAST 90 capsule 1  ? ondansetron (ZOFRAN) 4 MG tablet TAKE 1 TABLET(4 MG) BY MOUTH THREE TIMES DAILY AS NEEDED FOR NAUSEA OR VOMITING 20 tablet 0  ? oxyCODONE-acetaminophen (PERCOCET) 5-325 MG tablet Take 1 tablet by mouth every 4 (four) hours as needed. 30 tablet 0  ? promethazine (PHENERGAN) 25 MG tablet Take 1 tablet (25 mg total) by mouth every 8 (eight) hours as needed for up to 3 days for nausea or vomiting. (Patient not taking: Reported on 10/12/2021) 9 tablet 0  ? Semaglutide, 1 MG/DOSE, (OZEMPIC, 1 MG/DOSE,) 4 MG/3ML SOPN Inject 1 mg into the skin once a week. 9 mL 0  ? spironolactone (ALDACTONE) 50 MG tablet TAKE 1 TABLET(50 MG) BY MOUTH DAILY 90 tablet 0  ? traZODone (DESYREL) 150 MG tablet Take 150 mg by mouth at bedtime.     ? ?No current facility-administered medications on file prior to visit.  ? ? ?Observations/Objective: ?Alert and oriented x 3. Not in acute distress. Physical examination not completed as this is a telemedicine visit. ? ?Assessment and Plan: ?1. Encounter for completion of form with patient: ?- PCS form completed today in office.  ?- Follow-up with primary provider as scheduled. ? ? ?Follow Up Instructions: ?Follow-up with primary provider as scheduled. ?  ?Patient was given clear instructions to go to Emergency Department or return to medical center if symptoms don't improve, worsen, or new problems develop.The patient verbalized understanding. ? ?I discussed the assessment and treatment plan with the patient. The patient was provided an opportunity to ask questions and all were answered. The patient agreed with the plan and demonstrated an  understanding of the instructions. ?  ?The patient was advised to call back or seek an in-person evaluation if the symptoms worsen or if the condition fails to improve as anticipated. ? ? ?I provided 15 minutes total of non-face-to-face time during this encounter. ? ? ?Camillia Herter, NP  ?[facility] ?[place], [place] ?[phone] ?12/10/2021, 9:00 AM ?

## 2021-12-10 ENCOUNTER — Ambulatory Visit (INDEPENDENT_AMBULATORY_CARE_PROVIDER_SITE_OTHER): Payer: Medicaid Other | Admitting: Family

## 2021-12-10 ENCOUNTER — Other Ambulatory Visit: Payer: Self-pay

## 2021-12-10 DIAGNOSIS — Z0289 Encounter for other administrative examinations: Secondary | ICD-10-CM

## 2021-12-16 ENCOUNTER — Ambulatory Visit (INDEPENDENT_AMBULATORY_CARE_PROVIDER_SITE_OTHER): Payer: Medicaid Other | Admitting: Podiatry

## 2021-12-16 ENCOUNTER — Other Ambulatory Visit: Payer: Self-pay

## 2021-12-16 ENCOUNTER — Ambulatory Visit (INDEPENDENT_AMBULATORY_CARE_PROVIDER_SITE_OTHER): Payer: Medicaid Other

## 2021-12-16 DIAGNOSIS — S92335B Nondisplaced fracture of third metatarsal bone, left foot, initial encounter for open fracture: Secondary | ICD-10-CM | POA: Diagnosis not present

## 2021-12-16 DIAGNOSIS — S92321D Displaced fracture of second metatarsal bone, right foot, subsequent encounter for fracture with routine healing: Secondary | ICD-10-CM

## 2021-12-16 DIAGNOSIS — M79675 Pain in left toe(s): Secondary | ICD-10-CM

## 2021-12-16 DIAGNOSIS — S92302A Fracture of unspecified metatarsal bone(s), left foot, initial encounter for closed fracture: Secondary | ICD-10-CM

## 2021-12-17 ENCOUNTER — Telehealth: Payer: Medicaid Other | Admitting: Family

## 2021-12-21 NOTE — Progress Notes (Signed)
Subjective: ?66 year old female presents the office today with concerns of left foot injury that occurred a week ago.  She states that she lost her balance and she fell and her foot went underneath her.  She has discomfort to the top of her foot mostly.  No pain to the ankle. ? ?Last A1c was 7 last glucose was 124 ? ?Objective: ?AAO x3, NAD ?DP/PT pulses palpable bilaterally, CRT less than 3 seconds ?Mild tenderness palpation diffusely along the dorsal midfoot specifically she has tenderness along the third metatarsal.  Flexor, extensor tendons clinically.  Intact.  There is mild edema present to the foot there is no erythema or warmth.  There is no open lesions.  No pain with calf compression, swelling, warmth, erythema ? ?Assessment: ?Left third metatarsal base fracture without displacement ? ?Plan: ?-All treatment options discussed with the patient including all alternatives, risks, complications.  ?-X-rays obtained and reviewed.  There is evidence of fracture noted transverse along the third metatarsal base.  There is no significant displacement.  The Lisfranc joint appears to be aligned. ?-Given the fracture recommend mobilization.  Cam boot was dispensed.  Encouraged ice and elevation. ?-Patient encouraged to call the office with any questions, concerns, change in symptoms.  ? ?Vivi Barrack DPM ? ?
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
## 2021-12-27 ENCOUNTER — Telehealth: Payer: Self-pay | Admitting: *Deleted

## 2021-12-27 NOTE — Telephone Encounter (Signed)
Patient is calling because her boot is still not fitting correctly , has come in to have it adjusted and it  seemed fine when leaving the office but now she said that her foot is still slipping out of the boot. ?Told her to come in to get another pad to put around the ankle to support it since she could only find one pad, stated that she would try that. ?

## 2021-12-28 ENCOUNTER — Other Ambulatory Visit: Payer: Self-pay

## 2021-12-28 ENCOUNTER — Encounter: Payer: Self-pay | Admitting: Family

## 2021-12-28 ENCOUNTER — Ambulatory Visit (INDEPENDENT_AMBULATORY_CARE_PROVIDER_SITE_OTHER): Payer: Medicaid Other | Admitting: Family

## 2021-12-28 VITALS — BP 130/84 | HR 94 | Temp 98.3°F | Resp 18 | Ht 60.0 in | Wt 180.0 lb

## 2021-12-28 DIAGNOSIS — Z13220 Encounter for screening for lipoid disorders: Secondary | ICD-10-CM

## 2021-12-28 DIAGNOSIS — Z1329 Encounter for screening for other suspected endocrine disorder: Secondary | ICD-10-CM

## 2021-12-28 DIAGNOSIS — G8929 Other chronic pain: Secondary | ICD-10-CM

## 2021-12-28 DIAGNOSIS — I10 Essential (primary) hypertension: Secondary | ICD-10-CM | POA: Diagnosis not present

## 2021-12-28 DIAGNOSIS — M25561 Pain in right knee: Secondary | ICD-10-CM

## 2021-12-28 DIAGNOSIS — Z130 Encounter for screening for diseases of the blood and blood-forming organs and certain disorders involving the immune mechanism: Secondary | ICD-10-CM

## 2021-12-28 DIAGNOSIS — Z0001 Encounter for general adult medical examination with abnormal findings: Secondary | ICD-10-CM

## 2021-12-28 DIAGNOSIS — E1122 Type 2 diabetes mellitus with diabetic chronic kidney disease: Secondary | ICD-10-CM | POA: Diagnosis not present

## 2021-12-28 DIAGNOSIS — E119 Type 2 diabetes mellitus without complications: Secondary | ICD-10-CM | POA: Diagnosis not present

## 2021-12-28 DIAGNOSIS — N1832 Chronic kidney disease, stage 3b: Secondary | ICD-10-CM

## 2021-12-28 DIAGNOSIS — J45909 Unspecified asthma, uncomplicated: Secondary | ICD-10-CM

## 2021-12-28 DIAGNOSIS — J449 Chronic obstructive pulmonary disease, unspecified: Secondary | ICD-10-CM

## 2021-12-28 DIAGNOSIS — Z1211 Encounter for screening for malignant neoplasm of colon: Secondary | ICD-10-CM

## 2021-12-28 DIAGNOSIS — Z0000 Encounter for general adult medical examination without abnormal findings: Secondary | ICD-10-CM

## 2021-12-28 LAB — POCT GLYCOSYLATED HEMOGLOBIN (HGB A1C): Hemoglobin A1C: 7.2 % — AB (ref 4.0–5.6)

## 2021-12-28 MED ORDER — OZEMPIC (1 MG/DOSE) 4 MG/3ML ~~LOC~~ SOPN: 1.0000 mg | PEN_INJECTOR | SUBCUTANEOUS | 0 refills | Status: DC

## 2021-12-28 MED ORDER — BD PEN NEEDLE NANO 2ND GEN 32G X 4 MM MISC: 1.0000 | Freq: Every day | 0 refills | Status: DC

## 2021-12-28 MED ORDER — SPIRONOLACTONE 50 MG PO TABS: ORAL_TABLET | ORAL | 0 refills | Status: DC

## 2021-12-28 MED ORDER — LEVEMIR FLEXTOUCH 100 UNIT/ML ~~LOC~~ SOPN: 32.0000 [IU] | PEN_INJECTOR | Freq: Every day | SUBCUTANEOUS | 0 refills | Status: DC

## 2021-12-28 MED ORDER — LISINOPRIL 10 MG PO TABS: ORAL_TABLET | ORAL | 0 refills | Status: DC

## 2021-12-28 NOTE — Progress Notes (Signed)
Diabetes discussed in office.

## 2021-12-28 NOTE — Patient Instructions (Signed)

## 2021-12-28 NOTE — Progress Notes (Signed)
Pt presents for annual physical exam, pt complains of right knee pain and swelling  ?

## 2021-12-29 ENCOUNTER — Other Ambulatory Visit: Payer: Self-pay | Admitting: Family

## 2021-12-29 DIAGNOSIS — E0590 Thyrotoxicosis, unspecified without thyrotoxic crisis or storm: Secondary | ICD-10-CM | POA: Insufficient documentation

## 2021-12-29 DIAGNOSIS — Z130 Encounter for screening for diseases of the blood and blood-forming organs and certain disorders involving the immune mechanism: Secondary | ICD-10-CM

## 2021-12-29 DIAGNOSIS — E785 Hyperlipidemia, unspecified: Secondary | ICD-10-CM

## 2021-12-29 LAB — CBC
Hematocrit: 30.5 % — ABNORMAL LOW (ref 34.0–46.6)
Hemoglobin: 9.6 g/dL — ABNORMAL LOW (ref 11.1–15.9)
MCH: 23.9 pg — ABNORMAL LOW (ref 26.6–33.0)
MCHC: 31.5 g/dL (ref 31.5–35.7)
MCV: 76 fL — ABNORMAL LOW (ref 79–97)
Platelets: 186 10*3/uL (ref 150–450)
RBC: 4.01 x10E6/uL (ref 3.77–5.28)
RDW: 15.4 % (ref 11.7–15.4)
WBC: 6 10*3/uL (ref 3.4–10.8)

## 2021-12-29 LAB — TSH: TSH: <0.005 u[IU]/mL — ABNORMAL LOW (ref 0.450–4.500)

## 2021-12-29 LAB — LIPID PANEL
Chol/HDL Ratio: 2.6 ratio (ref 0.0–4.4)
Cholesterol, Total: 126 mg/dL (ref 100–199)
HDL: 49 mg/dL (ref >39)
LDL Chol Calc (NIH): 58 mg/dL (ref 0–99)
Triglycerides: 102 mg/dL (ref 0–149)
VLDL Cholesterol Cal: 19 mg/dL (ref 5–40)

## 2021-12-29 MED ORDER — ATORVASTATIN CALCIUM 10 MG PO TABS: 10.0000 mg | ORAL_TABLET | Freq: Every day | ORAL | 1 refills | Status: DC

## 2021-12-29 NOTE — Progress Notes (Signed)
Continue Atorvastatin for cholesterol maintenance.  ? ?Adding iron panel to further screen for anemia.  ? ?Referral to Endocrinology for further evaluation and management of hyperthyroidism. Their office should call patient within 2 weeks with appointment details.

## 2021-12-31 ENCOUNTER — Other Ambulatory Visit: Payer: Self-pay | Admitting: Family

## 2021-12-31 DIAGNOSIS — Z130 Encounter for screening for diseases of the blood and blood-forming organs and certain disorders involving the immune mechanism: Secondary | ICD-10-CM

## 2021-12-31 LAB — IRON,TIBC AND FERRITIN PANEL
Ferritin: 279 ng/mL — ABNORMAL HIGH (ref 15–150)
Iron Saturation: 28 % (ref 15–55)
Iron: 81 ug/dL (ref 27–139)
Total Iron Binding Capacity: 290 ug/dL (ref 250–450)
UIBC: 209 ug/dL (ref 118–369)

## 2021-12-31 LAB — SPECIMEN STATUS REPORT

## 2021-12-31 NOTE — Progress Notes (Signed)
Ferritin consistent with iron overload. Recheck in 4 weeks.

## 2022-01-03 ENCOUNTER — Ambulatory Visit: Payer: Self-pay | Admitting: *Deleted

## 2022-01-03 NOTE — Telephone Encounter (Signed)
Summary: Shingles vaccine advice  ? Pt is calling to ask when she was last in the office for her Shingles vaccine? Pt would like to know how long does she need to wait to receive her second vaccine? Can she receive it 01/24/22 when she comes in for her iron?   ?  ?Pt just checking to see when her first Shingles Vaccine was. It was 2/22, her upcoming appt is 01/24/22. Pt to ask for it that appt but understands if too early she will come back for it. ?

## 2022-01-04 ENCOUNTER — Other Ambulatory Visit: Payer: Self-pay

## 2022-01-04 ENCOUNTER — Encounter: Payer: Self-pay | Admitting: Internal Medicine

## 2022-01-04 ENCOUNTER — Ambulatory Visit: Payer: Medicaid Other | Admitting: Internal Medicine

## 2022-01-04 VITALS — BP 130/82 | HR 74 | Ht 60.0 in | Wt 185.0 lb

## 2022-01-04 DIAGNOSIS — E0590 Thyrotoxicosis, unspecified without thyrotoxic crisis or storm: Secondary | ICD-10-CM | POA: Diagnosis not present

## 2022-01-04 MED ORDER — METHIMAZOLE 5 MG PO TABS: 5.0000 mg | ORAL_TABLET | Freq: Every day | ORAL | 1 refills | Status: DC

## 2022-01-04 NOTE — Patient Instructions (Signed)
We recommend that you follow these hyperthyroidism instructions at home: ? ?1) Take Methimazole 5 mg one tablet  a day ?If you develop severe sore throat with high fevers OR develop unexplained yellowing of your skin, eyes, under your tongue, severe abdominal pain with nausea or vomiting --> then please get evaluated immediately. ? ?2) Get repeat thyroid labs 6 weeks .  ?It is ESSENTIAL to get follow-up labs to help avoid over or undertreatment of your hyperthyroidism - both of which can be dangerous to your health. ? ? ? ?

## 2022-01-04 NOTE — Progress Notes (Signed)
? ? ?Name: Kara Kelley  ?MRN/ DOB: [id number], 1955/10/29    ?Age/ Sex: 66 y.o., female   ? ?PCP: Camillia Herter, NP   ?Reason for Endocrinology Evaluation: Hyperthyroidism   ?   ?Date of Initial Endocrinology Evaluation: 01/04/2022   ? ? ?HPI: ?Kara Kelley is a 66 y.o. female with a past medical history of T2DM, COPD, and HTN. The patient presented for initial endocrinology clinic visit on 01/04/2022 for consultative assistance with her Hyperthyroidism.  ? ?Pt has been diagnosed with hyperthyroidism with a suppressed TSH at < 0.005 uIU/mL on 12/28/2021, no concomitant FT4  , during routine physical  ? ?Weight has ben fluctuating  ?Has occasional palpitations  ?Denies loose stools or diarrhea but has constipation  ?Has noted worsening tremors ~ 3 weeks ago  ?Has anxiety  ? ?No prior diagnosis of Osteoporosis  ?Has DJD  ?No prior dx of cardiac arrhythmia  ?Denies local neck swelling  ? ? ?No FH of thyroid disease  ? ? ? ?HISTORY:  ?Past Medical History:  ?Past Medical History:  ?Diagnosis Date  ? Anemia   ? Anxiety   ? Arthritis   ? Asthma   ? Bunion   ? Callus   ? Chronic pain   ? Cocaine abuse ([place])   ? COPD (chronic obstructive pulmonary disease) ([place])   ? Corns and callosities   ? Degenerative joint disease   ? Depression   ? Diabetes mellitus   ? Endometrial polyp   ? ETOH abuse   ? Gall stones   ? GERD (gastroesophageal reflux disease)   ? Headache   ? history of Migraines  ? Hepatitis C   ? Hep C  ? Hyperlipidemia   ? Hypertension   ? Insomnia   ? Spondylolisthesis of lumbar region   ? Substance abuse ([place])   ? alcoholism  ? Tuberculosis 1985  ? Wears dentures   ? Wears glasses   ? ?Past Surgical History:  ?Past Surgical History:  ?Procedure Laterality Date  ? ANTERIOR CERVICAL DECOMP/DISCECTOMY FUSION N/A 10/26/2021  ? Procedure: ACDF - C4-C5 - C5-C6 - C6-C7;  Surgeon: Earnie Larsson, MD;  Location: [facility];  Service: Neurosurgery;  Laterality: N/A;  ? BACK SURGERY  2018  ? CESAREAN SECTION    ? x3  ?  CHOLECYSTECTOMY N/A 08/11/2016  ? Procedure: LAPAROSCOPIC CHOLECYSTECTOMY WITH   INTRAOPERATIVE CHOLANGIOGRAM;  Surgeon: Stark Klein, MD;  Location: [facility];  Service: General;  Laterality: N/A;  ? COLONOSCOPY    ? Cotton Osteotomy w/ Graft Left 06/18/2009  ? Excision of Benign Lesion Right 01/30/2013  ? Rt Plantar  ? FOOT SURGERY    ? HAMMER TOE REPAIR Right 02/12/2016  ? RIGHT #5  ? Hammertoe Repair Left 06/18/2009  ? Lt #5  ? HYSTEROSCOPY N/A 06/20/2017  ? Procedure: DILATION AND CURETTAGE, HYSTEROSCOPY w/ Polypectomy;  Surgeon: Lavonia Drafts, MD;  Location: [facility];  Service: Gynecology;  Laterality: N/A;  ? LUMBAR FUSION  2018  ? METATARSAL OSTEOTOMY Left 06/18/2009  ? #5  ? MULTIPLE TOOTH EXTRACTIONS    ? Nail Matrixectomy Left 06/18/2009  ? LT #1  ? OSTEOTOMY Right 01/30/2013  ? Rt #5  ? Phalangectomy Left 06/18/2009  ? LT #1  ? Phalangectomy Right 01/30/2013  ? Rt #1  ? TUBAL LIGATION    ?  ?Social History:  reports that she quit smoking about 3 years ago. Her smoking use included cigarettes. She smoked an average of .5 packs  per day. She has never used smokeless tobacco. She reports that she does not drink alcohol and does not use drugs. ?Family History: family history includes Cirrhosis in some other family members; Diabetes in an other family member; Heart disease in her mother. ? ? ?HOME MEDICATIONS: ?Allergies as of 01/04/2022   ?No Known Allergies ?  ? ?  ?Medication List  ?  ? ?  ? Accurate as of January 04, 2022 11:13 AM. If you have any questions, ask your nurse or doctor.  ?  ?  ? ?  ? ?Accu-Chek FastClix Lancets Misc ?Use as directed to check blood sugars 2 times per day dx: e11.65 ?  ?Accu-Chek Softclix Lancets lancets ?Use as instructed to check blood sugars twice daily E11.69 ?  ?Accu-Chek Guide test strip ?Generic drug: glucose blood ?Use as directed to check blood sugars 2 times per day dx: e11.69 ?  ?Accu-Chek Nano SmartView w/Device Kit ?Use as directed to check blood sugars 2 times per day dx:  e11.65 ?  ?Accu-Chek Guide Me w/Device Kit ?Use to check blood sugars twice daily E11.69 ?  ?albuterol 108 (90 Base) MCG/ACT inhaler ?Commonly known as: VENTOLIN HFA ?Inhale 2 puffs into the lungs every 6 (six) hours as needed for wheezing or shortness of breath. ?  ?ARIPiprazole 10 MG tablet ?Commonly known as: ABILIFY ?Take 10 mg by mouth daily. ?  ?atorvastatin 10 MG tablet ?Commonly known as: LIPITOR ?Take 1 tablet (10 mg total) by mouth daily. ?  ?BD Pen Needle Nano 2nd Gen 32G X 4 MM Misc ?Generic drug: Insulin Pen Needle ?Inject 1 each into the skin daily. as directed ?  ?buPROPion 300 MG 24 hr tablet ?Commonly known as: WELLBUTRIN XL ?Take 1 tablet (300 mg total) by mouth daily. ?  ?cyclobenzaprine 5 MG tablet ?Commonly known as: FLEXERIL ?TAKE 1 TABLET(5 MG) BY MOUTH THREE TIMES DAILY AS NEEDED FOR MUSCLE SPASMS ?  ?gabapentin 300 MG capsule ?Commonly known as: NEURONTIN ?TAKE 1 CAPSULE(300 MG) BY MOUTH THREE TIMES DAILY AS NEEDED FOR PAIN ?Strength: 300 mg ?  ?Gvoke HypoPen 2-Pack 1 MG/0.2ML Soaj ?Generic drug: Glucagon ?Inject 1 each into the skin as needed. If blood sugar less than 60 ?  ?hydrOXYzine 25 MG tablet ?Commonly known as: ATARAX ?Take 1 tablet (25 mg total) by mouth every 8 (eight) hours as needed for anxiety. ?  ?Levemir FlexTouch 100 UNIT/ML FlexPen ?Generic drug: insulin detemir ?Inject 32 Units into the skin at bedtime. ?  ?Linzess 145 MCG Caps capsule ?Generic drug: linaclotide ?Take 145 mcg by mouth daily before breakfast. ?  ?lisinopril 10 MG tablet ?Commonly known as: ZESTRIL ?TAKE 1 TABLET(10 MG) BY MOUTH DAILY ?  ?meloxicam 15 MG tablet ?Commonly known as: MOBIC ?Take 15 mg by mouth daily as needed for pain. ?  ?methimazole 5 MG tablet ?Commonly known as: TAPAZOLE ?Take 1 tablet (5 mg total) by mouth daily. ?Started by: Dorita Sciara, MD ?  ?multivitamin with minerals tablet ?Take 1 tablet by mouth daily. 50 + ?  ?NovoLOG FlexPen 100 UNIT/ML FlexPen ?Generic drug: insulin  aspart ?Inject 18 Units into the skin 3 (three) times daily with meals. ?  ?omeprazole 40 MG capsule ?Commonly known as: PRILOSEC ?TAKE 1 CAPSULE(40 MG) BY MOUTH DAILY BEFORE BREAKFAST ?  ?ondansetron 4 MG tablet ?Commonly known as: ZOFRAN ?TAKE 1 TABLET(4 MG) BY MOUTH THREE TIMES DAILY AS NEEDED FOR NAUSEA OR VOMITING ?  ?oxyCODONE-acetaminophen 5-325 MG tablet ?Commonly known as: Percocet ?Take 1 tablet by  mouth every 4 (four) hours as needed. ?  ?Ozempic (1 MG/DOSE) 4 MG/3ML Sopn ?Generic drug: Semaglutide (1 MG/DOSE) ?Inject 1 mg into the skin once a week. ?  ?promethazine 25 MG tablet ?Commonly known as: PHENERGAN ?Take 1 tablet (25 mg total) by mouth every 8 (eight) hours as needed for up to 3 days for nausea or vomiting. ?  ?spironolactone 50 MG tablet ?Commonly known as: ALDACTONE ?TAKE 1 TABLET(50 MG) BY MOUTH DAILY ?  ?traZODone 150 MG tablet ?Commonly known as: DESYREL ?Take 150 mg by mouth at bedtime. ?  ? ?  ?  ? ? ?REVIEW OF SYSTEMS: ?A comprehensive ROS was conducted with the patient and is negative except as per HPI and below:  ?ROS  ? ? ? ?OBJECTIVE:  ?VS: BP 130/82 (BP Location: Left Arm, Patient Position: Sitting, Cuff Size: Large)   Pulse 74   Ht 5' (1.524 m)   Wt 185 lb (83.9 kg)   SpO2 96%   BMI 36.13 kg/m?   ? ?Wt Readings from Last 3 Encounters:  ?01/04/22 185 lb (83.9 kg)  ?12/28/21 180 lb (81.6 kg)  ?11/15/21 178 lb (80.7 kg)  ? ? ? ?EXAM: ?General: Pt appears well and is in NAD  ?Eyes: External eye exam normal without stare, lid lag or exophthalmos.  EOM intact.  P.  ?Neck: General: Supple without adenopathy. ?Thyroid: Thyroid size normal.  No goiter or nodules appreciated. Right Anterior incision noted with tenderness   ?Lungs: Clear with good BS bilat with no rales, rhonchi, or wheezes  ?Heart: Auscultation: RRR.  ?Abdomen: Normoactive bowel sounds, soft, nontender, without masses or organomegaly palpable  ?Extremities:  ?BL LE: No pretibial edema normal ROM and strength.  ?Mental  Status: Judgment, insight: Intact ?Orientation: Oriented to time, place, and person ?Mood and affect: No depression, anxiety, or agitation  ? ? ? ?DATA REVIEWED: ? ?  ? Latest Reference Range & Units 10/19/21 11:42

## 2022-01-06 ENCOUNTER — Ambulatory Visit: Payer: Medicaid Other | Admitting: Podiatry

## 2022-01-18 ENCOUNTER — Other Ambulatory Visit: Payer: Self-pay | Admitting: Nurse Practitioner

## 2022-01-24 ENCOUNTER — Other Ambulatory Visit (INDEPENDENT_AMBULATORY_CARE_PROVIDER_SITE_OTHER): Payer: Medicaid Other

## 2022-01-24 DIAGNOSIS — Z23 Encounter for immunization: Secondary | ICD-10-CM | POA: Diagnosis not present

## 2022-01-24 DIAGNOSIS — Z130 Encounter for screening for diseases of the blood and blood-forming organs and certain disorders involving the immune mechanism: Secondary | ICD-10-CM

## 2022-01-24 NOTE — Progress Notes (Signed)
Patient presents for vaccine injection today. Patient tolerated injection well and was observed without any concerns.  

## 2022-01-25 LAB — FERRITIN: Ferritin: 187 ng/mL — ABNORMAL HIGH (ref 15–150)

## 2022-01-25 NOTE — Progress Notes (Signed)
Iron load improved since 4 weeks ago.

## 2022-01-27 ENCOUNTER — Ambulatory Visit: Payer: Medicaid Other | Admitting: Podiatry

## 2022-01-31 ENCOUNTER — Ambulatory Visit (INDEPENDENT_AMBULATORY_CARE_PROVIDER_SITE_OTHER): Payer: Medicaid Other

## 2022-01-31 ENCOUNTER — Ambulatory Visit (INDEPENDENT_AMBULATORY_CARE_PROVIDER_SITE_OTHER): Payer: Medicaid Other | Admitting: Podiatry

## 2022-01-31 DIAGNOSIS — S92302A Fracture of unspecified metatarsal bone(s), left foot, initial encounter for closed fracture: Secondary | ICD-10-CM

## 2022-01-31 DIAGNOSIS — S92302D Fracture of unspecified metatarsal bone(s), left foot, subsequent encounter for fracture with routine healing: Secondary | ICD-10-CM | POA: Diagnosis not present

## 2022-02-01 NOTE — Progress Notes (Signed)
Subjective: ?66 year old female presents the office today for follow-up evaluation of the fracture of the left third metatarsal.  She tried the cam boot was not fitting appropriately we tried changing sizes and she states it is still causing discomfort so she stopped wearing this and she is been wearing a regular shoe.  She states that she still gets discomfort to her foot.  No recent injury or changes otherwise since I last saw her.  Occasional swelling. ? ?Last A1c was 7 ? ?Objective: ?AAO x3, NAD ?DP/PT pulses palpable bilaterally, CRT less than 3 seconds ?Mild tenderness palpation diffusely along the dorsal midfoot specifically still on the third metatarsal.  No other areas of pinpoint tenderness.  Flexor, extensor tendons appear to be intact clinically.  No pain in the ankle. ?No pain with calf compression, swelling, warmth, erythema ? ?Assessment: ?Left third metatarsal base fracture without displacement ? ?Plan: ?-All treatment options discussed with the patient including all alternatives, risks, complications.  ?-Repeat x-rays obtained reviewed.  3 views of the left foot were obtained.  Fracture of the third metatarsal base.  Lisfranc joint appears to be in good alignment.  No other evidence of acute fracture noted. ?-Still would recommend immobilization.  Surgical shoe was dispensed.  ABN was signed today. ?-Ice, elevation. ? ?Trula Slade DPM ?

## 2022-02-02 NOTE — Progress Notes (Deleted)
Synopsis: Referred for ACOS by Camillia Herter, NP  Subjective:   PATIENT ID: Kara Kelley GENDER: female DOB: Jan 03, 1956, MRN: [id number]  No chief complaint on file.  19yF with history of ACOS, HCV, DM, cocaine use, alcohol use referred for ACOS  Otherwise pertinent review of systems is negative.  Past Medical History:  Diagnosis Date   Anemia    Anxiety    Arthritis    Asthma    Bunion    Callus    Chronic pain    Cocaine abuse ([facility])    COPD (chronic obstructive pulmonary disease) ([facility])    Corns and callosities    Degenerative joint disease    Depression    Diabetes mellitus    Endometrial polyp    ETOH abuse    Gall stones    GERD (gastroesophageal reflux disease)    Headache    history of Migraines   Hepatitis C    Hep C   Hyperlipidemia    Hypertension    Insomnia    Spondylolisthesis of lumbar region    Substance abuse ([place])    alcoholism   Tuberculosis 1985   Wears dentures    Wears glasses      Family History  Problem Relation Age of Onset   Heart disease Mother    Diabetes Other        mat great aunt   Cirrhosis Other        mat great aunt   Cirrhosis Other        mat great uncles x 2   Colon cancer Neg Hx    Breast cancer Neg Hx      Past Surgical History:  Procedure Laterality Date   ANTERIOR CERVICAL DECOMP/DISCECTOMY FUSION N/A 10/26/2021   Procedure: ACDF - C4-C5 - C5-C6 - C6-C7;  Surgeon: Earnie Larsson, MD;  Location: [facility];  Service: Neurosurgery;  Laterality: N/A;   BACK SURGERY  2018   CESAREAN SECTION     x3   CHOLECYSTECTOMY N/A 08/11/2016   Procedure: LAPAROSCOPIC CHOLECYSTECTOMY WITH   INTRAOPERATIVE CHOLANGIOGRAM;  Surgeon: Stark Klein, MD;  Location: [facility];  Service: General;  Laterality: N/A;   COLONOSCOPY     Cotton Osteotomy w/ Graft Left 06/18/2009   Excision of Benign Lesion Right 01/30/2013   Rt Plantar   FOOT SURGERY     HAMMER TOE REPAIR Right 02/12/2016   RIGHT #5   Hammertoe Repair Left 06/18/2009   Lt #5    HYSTEROSCOPY N/A 06/20/2017   Procedure: DILATION AND CURETTAGE, HYSTEROSCOPY w/ Polypectomy;  Surgeon: Lavonia Drafts, MD;  Location: [facility];  Service: Gynecology;  Laterality: N/A;   LUMBAR FUSION  2018   METATARSAL OSTEOTOMY Left 06/18/2009   #5   MULTIPLE TOOTH EXTRACTIONS     Nail Matrixectomy Left 06/18/2009   LT #1   OSTEOTOMY Right 01/30/2013   Rt #5   Phalangectomy Left 06/18/2009   LT #1   Phalangectomy Right 01/30/2013   Rt #1   TUBAL LIGATION      Social History   Socioeconomic History   Marital status: Single    Spouse name: Not on file   Number of children: 3   Years of education: Not on file   Highest education level: Not on file  Occupational History   Occupation: disablily  Tobacco Use   Smoking status: Former    Packs/day: 0.50    Types: Cigarettes    Quit date: 2020    Years since  quitting: 3.3   Smokeless tobacco: Never  Vaping Use   Vaping Use: Never used  Substance and Sexual Activity   Alcohol use: No    Alcohol/week: 0.0 standard drinks    Comment: in rehab   Drug use: No    Comment: in rehab   Sexual activity: Not Currently    Birth control/protection: Post-menopausal  Other Topics Concern   Not on file  Social History Narrative   Not on file   Social Determinants of Health   Financial Resource Strain: Not on file  Food Insecurity: Not on file  Transportation Needs: Not on file  Physical Activity: Not on file  Stress: Not on file  Social Connections: Not on file  Intimate Partner Violence: Not on file     No Known Allergies   Outpatient Medications Prior to Visit  Medication Sig Dispense Refill   Accu-Chek FastClix Lancets MISC Use as directed to check blood sugars 2 times per day dx: e11.65 102 each prn   Accu-Chek Softclix Lancets lancets Use as instructed to check blood sugars twice daily E11.69 100 each 2   albuterol (VENTOLIN HFA) 108 (90 Base) MCG/ACT inhaler Inhale 2 puffs into the lungs every 6 (six) hours as needed  for wheezing or shortness of breath. 18 g 2   ARIPiprazole (ABILIFY) 10 MG tablet Take 10 mg by mouth daily.      atorvastatin (LIPITOR) 10 MG tablet Take 1 tablet (10 mg total) by mouth daily. 90 tablet 1   Blood Glucose Monitoring Suppl (ACCU-CHEK GUIDE ME) w/Device KIT Use to check blood sugars twice daily E11.69 1 kit 1   Blood Glucose Monitoring Suppl (ACCU-CHEK NANO SMARTVIEW) w/Device KIT Use as directed to check blood sugars 2 times per day dx: e11.65 1 kit 0   buPROPion (WELLBUTRIN XL) 300 MG 24 hr tablet Take 1 tablet (300 mg total) by mouth daily. 90 tablet 0   cyclobenzaprine (FLEXERIL) 5 MG tablet TAKE 1 TABLET(5 MG) BY MOUTH THREE TIMES DAILY AS NEEDED FOR MUSCLE SPASMS 30 tablet 1   gabapentin (NEURONTIN) 300 MG capsule TAKE 1 CAPSULE(300 MG) BY MOUTH THREE TIMES DAILY AS NEEDED FOR PAIN Strength: 300 mg 180 capsule 0   Glucagon (GVOKE HYPOPEN 2-PACK) 1 MG/0.2ML SOAJ Inject 1 each into the skin as needed. If blood sugar less than 60 0.4 mL 3   glucose blood (ACCU-CHEK GUIDE) test strip Use as directed to check blood sugars 2 times per day dx: e11.69 300 strip 2   hydrOXYzine (ATARAX) 25 MG tablet Take 1 tablet (25 mg total) by mouth every 8 (eight) hours as needed for anxiety. 30 tablet 1   insulin detemir (LEVEMIR FLEXTOUCH) 100 UNIT/ML FlexPen Inject 32 Units into the skin at bedtime. 30 mL 0   Insulin Pen Needle (BD PEN NEEDLE NANO 2ND GEN) 32G X 4 MM MISC Inject 1 each into the skin daily. as directed 200 each 0   LINZESS 145 MCG CAPS capsule Take 145 mcg by mouth daily before breakfast.      lisinopril (ZESTRIL) 10 MG tablet TAKE 1 TABLET(10 MG) BY MOUTH DAILY 90 tablet 0   meloxicam (MOBIC) 15 MG tablet Take 15 mg by mouth daily as needed for pain.     methimazole (TAPAZOLE) 5 MG tablet Take 1 tablet (5 mg total) by mouth daily. 90 tablet 1   Multiple Vitamins-Minerals (MULTIVITAMIN WITH MINERALS) tablet Take 1 tablet by mouth daily. 50 +     NOVOLOG FLEXPEN 100 UNIT/ML  FlexPen Inject 18 Units into the skin 3 (three) times daily with meals. 15 mL 3   omeprazole (PRILOSEC) 40 MG capsule TAKE 1 CAPSULE(40 MG) BY MOUTH DAILY BEFORE BREAKFAST 90 capsule 1   ondansetron (ZOFRAN) 4 MG tablet TAKE 1 TABLET(4 MG) BY MOUTH THREE TIMES DAILY AS NEEDED FOR NAUSEA OR VOMITING 20 tablet 0   oxyCODONE-acetaminophen (PERCOCET) 5-325 MG tablet Take 1 tablet by mouth every 4 (four) hours as needed. 30 tablet 0   promethazine (PHENERGAN) 25 MG tablet Take 1 tablet (25 mg total) by mouth every 8 (eight) hours as needed for up to 3 days for nausea or vomiting. (Patient not taking: Reported on 10/12/2021) 9 tablet 0   Semaglutide, 1 MG/DOSE, (OZEMPIC, 1 MG/DOSE,) 4 MG/3ML SOPN Inject 1 mg into the skin once a week. 9 mL 0   spironolactone (ALDACTONE) 50 MG tablet TAKE 1 TABLET(50 MG) BY MOUTH DAILY 90 tablet 0   traZODone (DESYREL) 150 MG tablet Take 150 mg by mouth at bedtime.      No facility-administered medications prior to visit.       Objective:   Physical Exam:  General appearance: 66 y.o., female, NAD, conversant  Eyes: anicteric sclerae; PERRL, tracking appropriately HENT: NCAT; MMM Neck: Trachea midline; no lymphadenopathy, no JVD Lungs: CTAB, no crackles, no wheeze, with normal respiratory effort CV: RRR, no murmur  Abdomen: Soft, non-tender; non-distended, BS present  Extremities: No peripheral edema, warm Skin: Normal turgor and texture; no rash Psych: Appropriate affect Neuro: Alert and oriented to person and place, no focal deficit     There were no vitals filed for this visit.   on *** LPM *** RA BMI Readings from Last 3 Encounters:  01/04/22 36.13 kg/m  12/28/21 35.15 kg/m  11/15/21 34.76 kg/m   Wt Readings from Last 3 Encounters:  01/04/22 185 lb (83.9 kg)  12/28/21 180 lb (81.6 kg)  11/15/21 178 lb (80.7 kg)     CBC    Component Value Date/Time   WBC 6.0 12/28/2021 0902   WBC 6.0 10/19/2021 1142   RBC 4.01 12/28/2021 0902   RBC  4.08 10/19/2021 1142   HGB 9.6 (L) 12/28/2021 0902   HCT 30.5 (L) 12/28/2021 0902   PLT 186 12/28/2021 0902   MCV 76 (L) 12/28/2021 0902   MCH 23.9 (L) 12/28/2021 0902   MCH 24.5 (L) 10/19/2021 1142   MCHC 31.5 12/28/2021 0902   MCHC 31.8 10/19/2021 1142   RDW 15.4 12/28/2021 0902   LYMPHSABS 2.1 07/20/2021 2038   MONOABS 0.3 07/20/2021 2038   EOSABS 0.1 07/20/2021 2038   BASOSABS 0.0 07/20/2021 2038     Chest Imaging: CXR 09/10/21 reviewed by me with ?LLL/retrocardiac opacity  CT A/P 10/20/20 with 47m solid LLL nodule   Pulmonary Functions Testing Results:     View : No data to display.          FeNO: ***  Pathology: ***  Echocardiogram: ***  Heart Catheterization: ***    Assessment & Plan:    Plan:      NMaryjane Hurter MD [facility] 02/02/2022 6:45 PM

## 2022-02-03 ENCOUNTER — Institutional Professional Consult (permissible substitution): Payer: Medicaid Other | Admitting: Student

## 2022-02-09 ENCOUNTER — Other Ambulatory Visit: Payer: Self-pay

## 2022-02-09 ENCOUNTER — Ambulatory Visit: Payer: Self-pay | Admitting: *Deleted

## 2022-02-09 NOTE — Telephone Encounter (Signed)
Reason for Disposition ? [1] Blood glucose < 70  mg/dL (3.9 mmol/L) or symptomatic, now improved with Care Advice AND [2] cause unknown ? ?Answer Assessment - Initial Assessment Questions ?1. SYMPTOMS: "What symptoms are you concerned about?" ?    Low glucose level ?2. ONSET:  "When did the symptoms start?" ?    Couple of day- always during the night ?3. BLOOD GLUCOSE: "What is your blood glucose level?"  ?    69, 113 ?4. USUAL RANGE: "What is your blood glucose level usually?" (e.g., usual fasting morning value, usual evening value) ?    135 ?5. TYPE 1 or 2:  "Do you know what type of diabetes you have?"  (e.g., Type 1, Type 2, Gestational; doesn't know)  ?    Type 2 ?6. INSULIN: "Do you take insulin?" "What type of insulin(s) do you use? What is the mode of delivery? (syringe, pen; injection or pump) "When did you last give yourself an insulin dose?" (i.e., time or hours/minutes ago) "How much did you give?" (i.e., how many units) ?    Yes- pen- yesterday dinnertime, 30 unit- didn't need last night ?7. DIABETES PILLS: "Do you take any pills for your diabetes?" ?    no ?8. OTHER SYMPTOMS: "Do you have any symptoms?" (e.g., fever, frequent urination, difficulty breathing, vomiting) ?    Frequent urination, COPD breathing, headache, nausea ?9. LOW BLOOD GLUCOSE TREATMENT: "What have you done so far to treat the low blood glucose level?" ?    Eat peppermint ?10. FOOD: "When did you last eat or drink?" ?      Last night- did have some water this morning ?11. ALONE: "Are you alone right now or is someone with you?"  ?      Daughter is in the home ?12. PREGNANCY: "Is there any chance you are pregnant?" "When was your last menstrual period?" ?      *No Answer* ? ?Protocols used: Diabetes - Low Blood Sugar-A-AH ? ?

## 2022-02-09 NOTE — Telephone Encounter (Signed)
?  Chief Complaint: low fasting glucose level in morning- 69- making patient nauseous, headache ?Symptoms:low levels in am for 2 days ?Frequency: 2 mornings ?Pertinent Negatives: Patient denies low levels during the day- patient states she sometimes does not need her insulin dosing ?Disposition: [] ED /[] Urgent Care (no appt availability in office) / [x] Appointment(In office/virtual)/ []  [facility]/ [] Home Care/ [] Refused Recommended Disposition /[] [facility]/ []  Follow-up with PCP ?Additional Notes: First available appointment scheduled- would like sooner appointment if office can see her before Monday   ?

## 2022-02-14 ENCOUNTER — Encounter: Payer: Self-pay | Admitting: Physician Assistant

## 2022-02-14 ENCOUNTER — Other Ambulatory Visit: Payer: Self-pay | Admitting: Family

## 2022-02-14 ENCOUNTER — Other Ambulatory Visit: Payer: Self-pay | Admitting: Physician Assistant

## 2022-02-14 ENCOUNTER — Ambulatory Visit (INDEPENDENT_AMBULATORY_CARE_PROVIDER_SITE_OTHER): Payer: Medicaid Other | Admitting: Physician Assistant

## 2022-02-14 VITALS — BP 135/85 | HR 84 | Temp 98.2°F | Resp 18 | Ht 59.0 in | Wt 191.0 lb

## 2022-02-14 DIAGNOSIS — M62838 Other muscle spasm: Secondary | ICD-10-CM

## 2022-02-14 DIAGNOSIS — E1122 Type 2 diabetes mellitus with diabetic chronic kidney disease: Secondary | ICD-10-CM

## 2022-02-14 DIAGNOSIS — Z794 Long term (current) use of insulin: Secondary | ICD-10-CM | POA: Diagnosis not present

## 2022-02-14 DIAGNOSIS — R519 Headache, unspecified: Secondary | ICD-10-CM

## 2022-02-14 DIAGNOSIS — N1832 Chronic kidney disease, stage 3b: Secondary | ICD-10-CM | POA: Diagnosis not present

## 2022-02-14 DIAGNOSIS — E162 Hypoglycemia, unspecified: Secondary | ICD-10-CM | POA: Diagnosis not present

## 2022-02-14 MED ORDER — METFORMIN HCL ER 500 MG PO TB24: 500.0000 mg | ORAL_TABLET | Freq: Every day | ORAL | 1 refills | Status: DC

## 2022-02-14 NOTE — Progress Notes (Signed)
? ?Established Patient Office Visit ? ?Subjective   ?Patient ID: Kara Kelley, female    DOB: January 06, 1956  Age: 66 y.o. MRN: [id number] ? ?Chief Complaint  ?Patient presents with  ? Hypoglycemia  ? ? ?States that she has been having episodes of hypoglycemia.  States that they tend to occur if she has used the NovoLog.  States that she has been using the NovoLog as a sliding scale.  States that if she uses the NovoLog before dinner as directed, she will have low blood glucose readings at night.  States that she woke up 1 evening and her blood glucose was 44.  States that she has been also having readings in the low 60s. ? ?States that her regimen currently is Levemir 30 units at bedtime, states that she did not increase this after her last office visit as directed.  States that she was not clear that she was supposed to increase this.  States that she has been using the NovoLog as a sliding scale instead of 18 units 3 times a day.  States that she continues taking the Ozempic once a week on Thursday. ? ?States that she did follow-up with endocrinology but states the only thing addressed was her thyroid issues. ? ?Does request restarting metformin, states that she was previously successful taking metformin, but states that she stopped taking it on her own due to "reading it can be bad for you" but does realize that she feels she was better controlled and it was easier for her to use. ? ? ?Past Medical History:  ?Diagnosis Date  ? Anemia   ? Anxiety   ? Arthritis   ? Asthma   ? Bunion   ? Callus   ? Chronic pain   ? Cocaine abuse (HCC)   ? COPD (chronic obstructive pulmonary disease) (HCC)   ? Corns and callosities   ? Degenerative joint disease   ? Depression   ? Diabetes mellitus   ? Endometrial polyp   ? ETOH abuse   ? Gall stones   ? GERD (gastroesophageal reflux disease)   ? Headache   ? history of Migraines  ? Hepatitis C   ? Hep C  ? Hyperlipidemia   ? Hypertension   ? Insomnia   ? Spondylolisthesis of lumbar  region   ? Substance abuse (HCC)   ? alcoholism  ? Tuberculosis 1985  ? Wears dentures   ? Wears glasses   ? ?Social History  ? ?Socioeconomic History  ? Marital status: Single  ?  Spouse name: Not on file  ? Number of children: 3  ? Years of education: Not on file  ? Highest education level: Not on file  ?Occupational History  ? Occupation: disablily  ?Tobacco Use  ? Smoking status: Former  ?  Packs/day: 0.50  ?  Types: Cigarettes  ?  Quit date: 2020  ?  Years since quitting: 3.3  ? Smokeless tobacco: Never  ?Vaping Use  ? Vaping Use: Never used  ?Substance and Sexual Activity  ? Alcohol use: No  ?  Alcohol/week: 0.0 standard drinks  ?  Comment: in rehab  ? Drug use: No  ?  Comment: in rehab  ? Sexual activity: Not Currently  ?  Birth control/protection: Post-menopausal  ?Other Topics Concern  ? Not on file  ?Social History Narrative  ? Not on file  ? ?Social Determinants of Health  ? ?Financial Resource Strain: Not on file  ?Food Insecurity: Not on file  ?  Transportation Needs: Not on file  ?Physical Activity: Not on file  ?Stress: Not on file  ?Social Connections: Not on file  ?Intimate Partner Violence: Not on file  ? ?Family History  ?Problem Relation Age of Onset  ? Heart disease Mother   ? Diabetes Other   ?     mat great aunt  ? Cirrhosis Other   ?     mat great aunt  ? Cirrhosis Other   ?     mat great uncles x 2  ? Colon cancer Neg Hx   ? Breast cancer Neg Hx   ? ?No Known Allergies ?  ? ?Review of Systems  ?Constitutional: Negative.   ?HENT: Negative.    ?Eyes:  Negative for blurred vision.  ?Respiratory:  Negative for shortness of breath.   ?Cardiovascular:  Negative for chest pain.  ?Gastrointestinal: Negative.   ?Genitourinary: Negative.   ?Musculoskeletal: Negative.   ?Skin: Negative.   ?Neurological:  Negative for dizziness, speech change, loss of consciousness and weakness.  ?Endo/Heme/Allergies: Negative.   ?Psychiatric/Behavioral: Negative.    ? ?  ?Objective:  ?  ? ?BP 135/85 (BP Location: Right  Arm, Patient Position: Sitting, Cuff Size: Normal)   Pulse 84   Temp 98.2 ?F (36.8 ?C) (Oral)   Resp 18   Ht 4\' 11"  (1.499 m)   Wt 191 lb (86.6 kg)   SpO2 92%   BMI 38.58 kg/m?  ? ? ?Physical Exam ?Vitals and nursing note reviewed.  ?Constitutional:   ?   Appearance: Normal appearance.  ?HENT:  ?   Head: Normocephalic and atraumatic.  ?   Right Ear: External ear normal.  ?   Left Ear: External ear normal.  ?   Nose: Nose normal.  ?   Mouth/Throat:  ?   Mouth: Mucous membranes are moist.  ?   Pharynx: Oropharynx is clear.  ?Eyes:  ?   Extraocular Movements: Extraocular movements intact.  ?   Conjunctiva/sclera: Conjunctivae normal.  ?   Pupils: Pupils are equal, round, and reactive to light.  ?Cardiovascular:  ?   Rate and Rhythm: Normal rate and regular rhythm.  ?   Pulses: Normal pulses.  ?   Heart sounds: Normal heart sounds.  ?Pulmonary:  ?   Effort: Pulmonary effort is normal.  ?   Breath sounds: Normal breath sounds.  ?Musculoskeletal:     ?   General: Normal range of motion.  ?   Cervical back: Normal range of motion and neck supple.  ?Skin: ?   General: Skin is warm and dry.  ?Neurological:  ?   General: No focal deficit present.  ?   Mental Status: She is alert and oriented to person, place, and time.  ?Psychiatric:     ?   Mood and Affect: Mood normal.     ?   Behavior: Behavior normal.     ?   Thought Content: Thought content normal.     ?   Judgment: Judgment normal.  ? ? ? ?  ?Assessment & Plan:  ? ?Problem List Items Addressed This Visit   ? ?  ? Endocrine  ? Type 2 diabetes mellitus with stage 3b chronic kidney disease, without long-term current use of insulin (HCC) - Primary  ? Relevant Medications  ? metFORMIN (GLUCOPHAGE-XR) 500 MG 24 hr tablet  ? ?1. Type 2 diabetes mellitus with stage 3b chronic kidney disease, with long-term current use of insulin (HCC) ?Patient does want to restart metformin at this  time and discontinue using the NovoLog.  Patient encouraged to take metformin with  breakfast, continue using Levemir at bedtime, however reduced to 25 units.  Patient encouraged to check blood glucose levels in the morning and at bedtime, keep a written log and bring to next office visit.  Patient education given on supportive care, red flags for prompt reevaluation. ? ?Patient given appointment to follow-up with PCP in 4 weeks. ?- metFORMIN (GLUCOPHAGE-XR) 500 MG 24 hr tablet; Take 1 tablet (500 mg total) by mouth daily with breakfast.  Dispense: 30 tablet; Refill: 1 ? ?2. Hypoglycemia ? ? ? ?I have reviewed the patient's medical history (PMH, PSH, Social History, Family History, Medications, and allergies) , and have been updated if relevant. I spent 30 minutes reviewing chart and  face to face time with patient. ? ? ? ?Return in about 4 weeks (around 03/14/2022) for with Ricky Stabs, NP at Primary Care at [facility].  ? ? ?Magdaline Zollars S Mayers, PA-C ? ?

## 2022-02-14 NOTE — Telephone Encounter (Signed)
Medication Refill - Medication: cyclobenzaprine (FLEXERIL) 5 MG tablet ? ?Also medication for nausea but not sure of the name / please advise / pt seen Cari today and she forgot to refill   ? ?Has the patient contacted their pharmacy? No. ?(Agent: If no, request that the patient contact the pharmacy for the refill. If patient does not wish to contact the pharmacy document the reason why and proceed with request.) ?(Agent: If yes, when and what did the pharmacy advise?) ? ?Preferred Pharmacy (with phone number or street name):  ?[facility] [phone] - [place], [place] - [address] Phone:  [phone]  ?Fax:  [phone]  ?  ? ?Has the patient been seen for an appointment in the last year OR does the patient have an upcoming appointment? Yes.   ? ?Agent: Please be advised that RX refills may take up to 3 business days. We ask that you follow-up with your pharmacy. ?

## 2022-02-14 NOTE — Progress Notes (Signed)
Patient has not eaten today. ?Patient has taken medication today. ?Patient reports Low CBG in the 40's and 60's. ?Patient would like to discuss DM medications. ? ?

## 2022-02-14 NOTE — Patient Instructions (Signed)
You are going to stop using the sliding scale NovoLog.  You are going to restart metformin once daily with breakfast.  You will continue the Levemir at night, but reduce to 25 units.  You will continue using the Ozempic once weekly. ? ?Please check your blood sugar levels twice daily, once in the morning before breakfast and then once before bed, keep a written log, and bring those with you to your follow-up appointment with Amy in 1 month. ? ?Please let us know if there is anything else we can do for you ? ?Kara Rad, PA-C ?Physician Assistant ?[facility] ?[email or web address] ? ? ?Hypoglycemia ?Hypoglycemia occurs when the level of sugar (glucose) in the blood is too low. Hypoglycemia can happen in people who have or do not have diabetes. It can develop quickly, and it can be a medical emergency. For most people, a blood glucose level below 70 mg/dL (3.9 mmol/L) is considered hypoglycemia. ?Glucose is a type of sugar that provides the body's main source of energy. Certain hormones (insulin and glucagon) control the level of glucose in the blood. Insulin lowers blood glucose, and glucagon raises blood glucose. Hypoglycemia can result from having too much insulin in the bloodstream, or from not eating enough food that contains glucose. You may also have reactive hypoglycemia, which happens within 4 hours after eating a meal. ?What are the causes? ?Hypoglycemia occurs most often in people who have diabetes and may be caused by: ?Diabetes medicine. ?Not eating enough, or not eating often enough. ?Increased physical activity. ?Drinking alcohol on an empty stomach. ?If you do not have diabetes, hypoglycemia may be caused by: ?A tumor in the pancreas. ?Not eating enough, or not eating for long periods at a time (fasting). ?A severe infection or illness. ?Problems after having bariatric surgery. ?Organ failure, such as kidney or liver failure. ?Certain  medicines. ?What increases the risk? ?Hypoglycemia is more likely to develop in people who: ?Have diabetes and take medicines to lower blood glucose. ?Abuse alcohol. ?Have a severe illness. ?What are the signs or symptoms? ?Symptoms vary depending on whether the condition is mild, moderate, or severe. ?Mild hypoglycemia ?Hunger. ?Sweating and feeling clammy. ?Dizziness or feeling light-headed. ?Sleepiness or restless sleep. ?Nausea. ?Increased heart rate. ?Headache. ?Blurry vision. ?Mood changes, such as irritability or anxiety. ?Tingling or numbness around the mouth, lips, or tongue. ?Moderate hypoglycemia ?Confusion and poor judgment. ?Behavior changes. ?Weakness. ?Irregular heartbeat. ?A change in coordination. ?Severe hypoglycemia ?Severe hypoglycemia is a medical emergency. It can cause: ?Fainting. ?Seizures. ?Loss of consciousness (coma). ?Death. ?How is this diagnosed? ?Hypoglycemia is diagnosed with a blood test to measure your blood glucose level. This blood test is done while you are having symptoms. Your health care provider may also do a physical exam and review your medical history. ?How is this treated? ?This condition can be treated by immediately eating or drinking something that contains sugar with 15 grams of fast-acting carbohydrate, such as: ?4 oz (120 mL) of fruit juice. ?4 oz (120 mL) of regular soda (not diet soda). ?Several pieces of hard candy. Check food labels to find out how many pieces to eat for 15 grams. ?1 Tbsp (15 mL) of sugar or honey. ?4 glucose tablets. ?1 tube of glucose gel. ?Treating hypoglycemia if you have diabetes ?If you are alert and able to swallow safely, follow the 15:15 rule: ?Take 15 grams of a fast-acting carbohydrate. Talk with your health care provider about how much you should take. Options for getting 15  grams of fast-acting carbohydrate include: ?Glucose tablets (take 4 tablets). ?Several pieces of hard candy. Check food labels to find out how many pieces to eat  for 15 grams. ?4 oz (120 mL) of fruit juice. ?4 oz (120 mL) of regular soda (not diet soda). ?1 Tbsp (15 mL) of sugar or honey. ?1 tube of glucose gel. ?Check your blood glucose 15 minutes after you take the carbohydrate. ?If the repeat blood glucose level is still at or below 70 mg/dL (3.9 mmol/L), take 15 grams of a carbohydrate again. ?If your blood glucose level does not increase above 70 mg/dL (3.9 mmol/L) after 3 tries, seek emergency medical care. ?After your blood glucose level returns to normal, eat a meal or a snack within 1 hour. ? ?Treating severe hypoglycemia ?Severe hypoglycemia is when your blood glucose level is below 54 mg/dL (3 mmol/L). Severe hypoglycemia is a medical emergency. Get medical help right away. ?If you have severe hypoglycemia and you cannot eat or drink, you will need to be given glucagon. A family member or close friend should learn how to check your blood glucose and how to give you glucagon. Ask your health care provider if you need to have an emergency glucagon kit available. ?Severe hypoglycemia may need to be treated in a hospital. The treatment may include getting glucose through an IV. You may also need treatment for the cause of your hypoglycemia. ?Follow these instructions at home: ? ?General instructions ?Take over-the-counter and prescription medicines only as told by your health care provider. ?Monitor your blood glucose as told by your health care provider. ?If you drink alcohol: ?Limit how much you have to: ?0-1 drink a day for women who are not pregnant. ?0-2 drinks a day for men. ?Know how much alcohol is in your drink. In the [place] one drink equals one 12 oz bottle of beer (355 mL), one 5 oz glass of wine (148 mL), or one 1? oz glass of hard liquor (44 mL). ?Be sure to eat food along with drinking alcohol. ?Be aware that alcohol is absorbed quickly and may have lingering effects that may result in hypoglycemia later. Be sure to do ongoing glucose monitoring. ?Keep  all follow-up visits. This is important. ?If you have diabetes: ?Always have a fast-acting carbohydrate (15 grams) option with you to treat low blood glucose. ?Follow your diabetes management plan as directed by your health care provider. Make sure you: ?Know the symptoms of hypoglycemia. It is important to treat it right away to prevent it from becoming severe. ?Check your blood glucose as often as told. Always check before and after exercise. ?Always check your blood glucose before you drive a motorized vehicle. ?Take your medicines as told. ?Follow your meal plan. Eat on time, and do not skip meals. ?Share your diabetes management plan with people in your workplace, school, and household. ?Carry a medical alert card or wear medical alert jewelry. ?Where to find more information ?American Diabetes Association: www.diabetes.org ?Contact a health care provider if: ?You have problems keeping your blood glucose in your target range. ?You have frequent episodes of hypoglycemia. ?Get help right away if: ?You continue to have hypoglycemia symptoms after eating or drinking something that contains 15 grams of fast-acting carbohydrate, and you cannot get your blood glucose above 70 mg/dL (3.9 mmol/L) while following the 15:15 rule. ?Your blood glucose is below 54 mg/dL (3 mmol/L). ?You have a seizure. ?You faint. ?These symptoms may represent a serious problem that is an emergency. Do not  wait to see if the symptoms will go away. Get medical help right away. Call your local emergency services (911 in the [place] Do not drive yourself to the hospital. ?Summary ?Hypoglycemia occurs when the level of sugar (glucose) in the blood is too low. ?Hypoglycemia can happen in people who have or do not have diabetes. It can develop quickly, and it can be a medical emergency. ?Make sure you know the symptoms of hypoglycemia and how to treat it. ?Always have a fast-acting carbohydrate option with you to treat low blood sugar. ?This  information is not intended to replace advice given to you by your health care provider. Make sure you discuss any questions you have with your health care provider. ?Document Revised: 08/27/2020 Document Reviewed: 1

## 2022-02-15 ENCOUNTER — Other Ambulatory Visit: Payer: Medicaid Other

## 2022-02-15 NOTE — Telephone Encounter (Signed)
Requested medication (s) are due for refill today: yes ? ?Requested medication (s) are on the active medication list: yes ? ?Last refill:  zofran - 11/15/21 #20 0 refills, flexeril- 10/12/21 #30 1 refills ? ?Future visit scheduled: yes in 1 month ? ?Notes to clinic:  not delegated per protocol, last ordered by [name], FNP. Do you want to refill Rx? ? ? ?  ?Requested Prescriptions  ?Pending Prescriptions Disp Refills  ? ondansetron (ZOFRAN) 4 MG tablet 20 tablet 0  ?  ? Not Delegated - Gastroenterology: Antiemetics - ondansetron Failed - 02/14/2022  5:43 PM  ?  ?  Failed - This refill cannot be delegated  ?  ?  Passed - AST in normal range and within 360 days  ?  AST  ?Date Value Ref Range Status  ?10/19/2021 17 15 - 41 U/L Final  ?  ?  ?  ?  Passed - ALT in normal range and within 360 days  ?  ALT  ?Date Value Ref Range Status  ?10/19/2021 19 0 - 44 U/L Final  ?06/15/2016 CANCELED    ?  Comment:  ?  Result canceled by the ancillary  ?  ?  ?  ?  Passed - Valid encounter within last 6 months  ?  Recent Outpatient Visits   ? ?      ? Yesterday Type 2 diabetes mellitus with stage 3b chronic kidney disease, with long-term current use of insulin ([place])  ? Primary Care at [place], PA-C  ? 1 month ago Annual physical exam  ? Primary Care at [facility], [place], NP  ? 2 months ago Encounter for completion of form with patient  ? [facility] Care at [facility], Amy J, NP  ? 3 months ago Encounter to establish care  ? Primary Care at [facility], Amy J, NP  ? 9 months ago   ? [place], [place], [place]  ? ?  ?  ?Future Appointments   ? ?        ? In 1 month Camillia Herter, NP Primary Care at [facility]  ? ?  ? ? ?  ?  ?  ? cyclobenzaprine (FLEXERIL) 5 MG tablet 30 tablet 1  ?  Sig: TAKE 1 TABLET(5 MG) BY MOUTH THREE TIMES DAILY AS NEEDED FOR MUSCLE SPASMS  ?  ? Not Delegated - Analgesics:  Muscle Relaxants Failed - 02/14/2022  5:43  PM  ?  ?  Failed - This refill cannot be delegated  ?  ?  Passed - Valid encounter within last 6 months  ?  Recent Outpatient Visits   ? ?      ? Yesterday Type 2 diabetes mellitus with stage 3b chronic kidney disease, with long-term current use of insulin ([place])  ? Primary Care at [place], PA-C  ? 1 month ago Annual physical exam  ? Primary Care at [facility], [place], NP  ? 2 months ago Encounter for completion of form with patient  ? [facility] Care at [facility], Amy J, NP  ? 3 months ago Encounter to establish care  ? Primary Care at [facility], Amy J, NP  ? 9 months ago   ? [place], [place], [place]  ? ?  ?  ?Future Appointments   ? ?        ?  In 1 month Camillia Herter, NP Primary Care at [facility]  ? ?  ? ? ?  ?  ?  ? ?

## 2022-02-22 ENCOUNTER — Other Ambulatory Visit: Payer: Self-pay | Admitting: Family

## 2022-02-22 DIAGNOSIS — M62838 Other muscle spasm: Secondary | ICD-10-CM

## 2022-02-22 DIAGNOSIS — R519 Headache, unspecified: Secondary | ICD-10-CM

## 2022-02-22 NOTE — Telephone Encounter (Signed)
Medication Refill - Medication: cyclobenzaprine (FLEXERIL) 5 MG tablet ? ?Has the patient contacted their pharmacy? Yes.   Pt told to contact provider ? ?Preferred Pharmacy (with phone number or street name):  ?[facility] [phone] - [place], [place] - [address] Phone:  [phone]  ?Fax:  [phone]  ?  ? ?Has the patient been seen for an appointment in the last year OR does the patient have an upcoming appointment? Yes.   ? ?Agent: Please be advised that RX refills may take up to 3 business days. We ask that you follow-up with your pharmacy. ? ?

## 2022-02-23 ENCOUNTER — Encounter: Payer: Self-pay | Admitting: Pulmonary Disease

## 2022-02-23 ENCOUNTER — Ambulatory Visit (INDEPENDENT_AMBULATORY_CARE_PROVIDER_SITE_OTHER): Payer: Medicaid Other | Admitting: Pulmonary Disease

## 2022-02-23 VITALS — BP 118/64 | HR 82 | Ht 59.0 in | Wt 182.0 lb

## 2022-02-23 DIAGNOSIS — Z87891 Personal history of nicotine dependence: Secondary | ICD-10-CM

## 2022-02-23 DIAGNOSIS — R053 Chronic cough: Secondary | ICD-10-CM | POA: Diagnosis not present

## 2022-02-23 DIAGNOSIS — J301 Allergic rhinitis due to pollen: Secondary | ICD-10-CM | POA: Diagnosis not present

## 2022-02-23 DIAGNOSIS — R0602 Shortness of breath: Secondary | ICD-10-CM | POA: Diagnosis not present

## 2022-02-23 MED ORDER — FLUTICASONE PROPIONATE 50 MCG/ACT NA SUSP: 1.0000 | Freq: Every day | NASAL | 2 refills | Status: DC

## 2022-02-23 NOTE — Telephone Encounter (Signed)
Requested medication (s) are due for refill today - no ? ?Requested medication (s) are on the active medication list -no ? ?Future visit scheduled -yes ? ?Last refill: unsure ? ?Notes to clinic: medication not on current medication list, outside provider Rx ? ?Requested Prescriptions  ?Pending Prescriptions Disp Refills  ? cyclobenzaprine (FLEXERIL) 5 MG tablet 30 tablet 1  ?  Sig: TAKE 1 TABLET(5 MG) BY MOUTH THREE TIMES DAILY AS NEEDED FOR MUSCLE SPASMS  ?  ? Not Delegated - Analgesics:  Muscle Relaxants Failed - 02/22/2022  3:55 PM  ?  ?  Failed - This refill cannot be delegated  ?  ?  Passed - Valid encounter within last 6 months  ?  Recent Outpatient Visits   ? ?      ? 1 week ago Type 2 diabetes mellitus with stage 3b chronic kidney disease, with long-term current use of insulin ([place])  ? Primary Care at [place], PA-C  ? 1 month ago Annual physical exam  ? Primary Care at [facility], [place], NP  ? 2 months ago Encounter for completion of form with patient  ? Primary Care at [facility], Amy J, NP  ? 3 months ago Encounter to establish care  ? Primary Care at [facility], Amy J, NP  ? 9 months ago   ? [place], [place], [place]  ? ?  ?  ?Future Appointments   ? ?        ? In 3 weeks Camillia Herter, NP Primary Care at [facility]  ? In 3 months Dewald, Cheryle Horsfall, MD [facility]  ? ?  ? ? ?  ?  ?  ? ? ? ?Requested Prescriptions  ?Pending Prescriptions Disp Refills  ? cyclobenzaprine (FLEXERIL) 5 MG tablet 30 tablet 1  ?  Sig: TAKE 1 TABLET(5 MG) BY MOUTH THREE TIMES DAILY AS NEEDED FOR MUSCLE SPASMS  ?  ? Not Delegated - Analgesics:  Muscle Relaxants Failed - 02/22/2022  3:55 PM  ?  ?  Failed - This refill cannot be delegated  ?  ?  Passed - Valid encounter within last 6 months  ?  Recent Outpatient Visits   ? ?      ? 1 week ago Type 2 diabetes mellitus with stage 3b chronic kidney disease, with  long-term current use of insulin ([place])  ? Primary Care at [place], PA-C  ? 1 month ago Annual physical exam  ? Primary Care at [facility], [place], NP  ? 2 months ago Encounter for completion of form with patient  ? Primary Care at [facility], Amy J, NP  ? 3 months ago Encounter to establish care  ? Primary Care at [facility], Amy J, NP  ? 9 months ago   ? [place], [place], [place]  ? ?  ?  ?Future Appointments   ? ?        ? In 3 weeks Camillia Herter, NP Primary Care at [facility]  ? In 3 months Dewald, Cheryle Horsfall, MD [facility]  ? ?  ? ? ?  ?  ?  ? ? ? ?

## 2022-02-23 NOTE — Progress Notes (Signed)
Synopsis: Referred in May 2023 for asthma by Durene Fruits, NP  Subjective:   PATIENT ID: Kara Kelley GENDER: female DOB: Nov 01, 1955, MRN: [id number]  HPI  Chief Complaint  Patient presents with   Consult    Referred by PCP for SOB. States this has been going on for years. It happens when she is sitting and with exertion.    Kara Kelley is a 66 year old woman, former smoker with history of DMII and GERD who is referred to pulmonary clinic for asthma.   She reports having shortness of breath, intermittent cough and wheezing for many years.  She is coughing up small amounts of clear phlegm.  She has increased coughing episodes at nighttime.  She continues to experience intermittent wheezing.  Triggers that bring on her symptoms include cigarette smoke exposure, hot humid days, strong perfumes/colognes or strong cleaning agents.  Spring allergies can also bother her breathing and lead to nasal congestion and drainage.  She is not currently taking any allergy medicines.  She can experience some nighttime awakenings due to cough but does not experience wheezing at that time.  She is currently taking lisinopril.  She has history of GERD and is currently taking omeprazole daily which controls her reflux well.  She uses as needed albuterol with little relief in her symptoms.  She is not currently on any maintenance inhaler.  She is a former smoker and quit 3 years ago.  She has a greater than 20-pack-year smoking history as she smoked from age [age], half a pack per day.  He reports being treated for tuberculosis in 1985 when she was living in [place].  Past Medical History:  Diagnosis Date   Anemia    Anxiety    Arthritis    Asthma    Bunion    Callus    Chronic pain    Cocaine abuse (HCC)    COPD (chronic obstructive pulmonary disease) (HCC)    Corns and callosities    Degenerative joint disease    Depression    Diabetes mellitus    Endometrial polyp    ETOH abuse     Gall stones    GERD (gastroesophageal reflux disease)    Headache    history of Migraines   Hepatitis C    Hep C   Hyperlipidemia    Hypertension    Insomnia    Spondylolisthesis of lumbar region    Substance abuse ([place])    alcoholism   Tuberculosis 1985   Wears dentures    Wears glasses      Family History  Problem Relation Age of Onset   Heart disease Mother    Diabetes Other        mat great aunt   Cirrhosis Other        mat great aunt   Cirrhosis Other        mat great uncles x 2   Colon cancer Neg Hx    Breast cancer Neg Hx      Social History   Socioeconomic History   Marital status: Single    Spouse name: Not on file   Number of children: 3   Years of education: Not on file   Highest education level: Not on file  Occupational History   Occupation: disablily  Tobacco Use   Smoking status: Former    Packs/day: 0.50    Types: Cigarettes    Quit date: 2020    Years since quitting: 3.3  Smokeless tobacco: Never  Vaping Use   Vaping Use: Never used  Substance and Sexual Activity   Alcohol use: No    Alcohol/week: 0.0 standard drinks    Comment: in rehab   Drug use: No    Comment: in rehab   Sexual activity: Not Currently    Birth control/protection: Post-menopausal  Other Topics Concern   Not on file  Social History Narrative   Not on file   Social Determinants of Health   Financial Resource Strain: Not on file  Food Insecurity: Not on file  Transportation Needs: Not on file  Physical Activity: Not on file  Stress: Not on file  Social Connections: Not on file  Intimate Partner Violence: Not on file     No Known Allergies   Outpatient Medications Prior to Visit  Medication Sig Dispense Refill   Accu-Chek FastClix Lancets MISC Use as directed to check blood sugars 2 times per day dx: e11.65 102 each prn   Accu-Chek Softclix Lancets lancets Use as instructed to check blood sugars twice daily E11.69 100 each 2   albuterol (VENTOLIN HFA) 108  (90 Base) MCG/ACT inhaler Inhale 2 puffs into the lungs every 6 (six) hours as needed for wheezing or shortness of breath. 18 g 2   ARIPiprazole (ABILIFY) 10 MG tablet Take 10 mg by mouth daily.      atorvastatin (LIPITOR) 10 MG tablet Take 1 tablet (10 mg total) by mouth daily. 90 tablet 1   Blood Glucose Monitoring Suppl (ACCU-CHEK GUIDE ME) w/Device KIT Use to check blood sugars twice daily E11.69 1 kit 1   Blood Glucose Monitoring Suppl (ACCU-CHEK NANO SMARTVIEW) w/Device KIT Use as directed to check blood sugars 2 times per day dx: e11.65 1 kit 0   buPROPion (WELLBUTRIN XL) 300 MG 24 hr tablet Take 1 tablet (300 mg total) by mouth daily. 90 tablet 0   Glucagon (GVOKE HYPOPEN 2-PACK) 1 MG/0.2ML SOAJ Inject 1 each into the skin as needed. If blood sugar less than 60 0.4 mL 3   glucose blood (ACCU-CHEK GUIDE) test strip Use as directed to check blood sugars 2 times per day dx: e11.69 300 strip 2   hydrOXYzine (ATARAX) 25 MG tablet Take 1 tablet (25 mg total) by mouth every 8 (eight) hours as needed for anxiety. 30 tablet 1   insulin detemir (LEVEMIR FLEXTOUCH) 100 UNIT/ML FlexPen Inject 32 Units into the skin at bedtime. 30 mL 0   Insulin Pen Needle (BD PEN NEEDLE NANO 2ND GEN) 32G X 4 MM MISC Inject 1 each into the skin daily. as directed 200 each 0   LINZESS 145 MCG CAPS capsule Take 145 mcg by mouth daily before breakfast.      lisinopril (ZESTRIL) 10 MG tablet TAKE 1 TABLET(10 MG) BY MOUTH DAILY 90 tablet 0   metFORMIN (GLUCOPHAGE-XR) 500 MG 24 hr tablet Take 1 tablet (500 mg total) by mouth daily with breakfast. 30 tablet 1   methimazole (TAPAZOLE) 5 MG tablet Take 1 tablet (5 mg total) by mouth daily. 90 tablet 1   Multiple Vitamins-Minerals (MULTIVITAMIN WITH MINERALS) tablet Take 1 tablet by mouth daily. 50 +     NOVOLOG FLEXPEN 100 UNIT/ML FlexPen Inject 18 Units into the skin 3 (three) times daily with meals. 15 mL 3   omeprazole (PRILOSEC) 40 MG capsule TAKE 1 CAPSULE(40 MG) BY MOUTH  DAILY BEFORE BREAKFAST 90 capsule 1   oxyCODONE-acetaminophen (PERCOCET) 5-325 MG tablet Take 1 tablet by mouth every 4 (four)  hours as needed. 30 tablet 0   Semaglutide, 1 MG/DOSE, (OZEMPIC, 1 MG/DOSE,) 4 MG/3ML SOPN Inject 1 mg into the skin once a week. 9 mL 0   spironolactone (ALDACTONE) 50 MG tablet TAKE 1 TABLET(50 MG) BY MOUTH DAILY 90 tablet 0   traZODone (DESYREL) 150 MG tablet Take 150 mg by mouth at bedtime.      cyclobenzaprine (FLEXERIL) 5 MG tablet TAKE 1 TABLET(5 MG) BY MOUTH THREE TIMES DAILY AS NEEDED FOR MUSCLE SPASMS (Patient not taking: Reported on 02/14/2022) 30 tablet 1   meloxicam (MOBIC) 15 MG tablet Take 15 mg by mouth daily as needed for pain. (Patient not taking: Reported on 02/14/2022)     ondansetron (ZOFRAN) 4 MG tablet TAKE 1 TABLET(4 MG) BY MOUTH THREE TIMES DAILY AS NEEDED FOR NAUSEA OR VOMITING (Patient not taking: Reported on 02/14/2022) 20 tablet 0   promethazine (PHENERGAN) 25 MG tablet Take 1 tablet (25 mg total) by mouth every 8 (eight) hours as needed for up to 3 days for nausea or vomiting. (Patient not taking: Reported on 10/12/2021) 9 tablet 0   No facility-administered medications prior to visit.   Review of Systems  Constitutional:  Negative for chills, fever, malaise/fatigue and weight loss.  HENT:  Negative for congestion, sinus pain and sore throat.   Eyes: Negative.   Respiratory:  Positive for cough, shortness of breath and wheezing. Negative for hemoptysis and sputum production.   Cardiovascular:  Negative for chest pain, palpitations, orthopnea, claudication and leg swelling.  Gastrointestinal:  Positive for heartburn. Negative for abdominal pain, nausea and vomiting.  Genitourinary: Negative.   Musculoskeletal:  Negative for joint pain and myalgias.  Skin:  Negative for rash.  Neurological:  Negative for weakness.  Endo/Heme/Allergies:  Positive for environmental allergies.  Psychiatric/Behavioral: Negative.     Objective:   Vitals:   02/23/22  1118  BP: 118/64  Pulse: 82  SpO2: 95%  Weight: 182 lb (82.6 kg)  Height: $Remove'4\' 11"'gUENcfr$  (1.499 m)   Physical Exam Constitutional:      General: She is not in acute distress.    Appearance: She is not ill-appearing.  HENT:     Head: Normocephalic and atraumatic.  Eyes:     General: No scleral icterus.    Conjunctiva/sclera: Conjunctivae normal.     Pupils: Pupils are equal, round, and reactive to light.  Cardiovascular:     Rate and Rhythm: Normal rate and regular rhythm.     Pulses: Normal pulses.     Heart sounds: Normal heart sounds. No murmur heard. Pulmonary:     Effort: Pulmonary effort is normal.     Breath sounds: Normal breath sounds. No wheezing, rhonchi or rales.  Abdominal:     General: Bowel sounds are normal.     Palpations: Abdomen is soft.  Musculoskeletal:     Right lower leg: No edema.     Left lower leg: No edema.  Lymphadenopathy:     Cervical: No cervical adenopathy.  Skin:    General: Skin is warm and dry.  Neurological:     General: No focal deficit present.     Mental Status: She is alert.  Psychiatric:        Mood and Affect: Mood normal.        Behavior: Behavior normal.        Thought Content: Thought content normal.        Judgment: Judgment normal.   CBC    Component Value Date/Time   WBC  6.0 12/28/2021 0902   WBC 6.0 10/19/2021 1142   RBC 4.01 12/28/2021 0902   RBC 4.08 10/19/2021 1142   HGB 9.6 (L) 12/28/2021 0902   HCT 30.5 (L) 12/28/2021 0902   PLT 186 12/28/2021 0902   MCV 76 (L) 12/28/2021 0902   MCH 23.9 (L) 12/28/2021 0902   MCH 24.5 (L) 10/19/2021 1142   MCHC 31.5 12/28/2021 0902   MCHC 31.8 10/19/2021 1142   RDW 15.4 12/28/2021 0902   LYMPHSABS 2.1 07/20/2021 2038   MONOABS 0.3 07/20/2021 2038   EOSABS 0.1 07/20/2021 2038   BASOSABS 0.0 07/20/2021 2038      Latest Ref Rng & Units 10/19/2021   11:42 AM 07/20/2021    8:38 PM 06/29/2021   11:04 AM  BMP  Glucose 70 - 99 mg/dL 149   109   148    BUN 8 - 23 mg/dL _0 Creatinine 0.44 - 1.00 mg/dL 1.01   1.18   1.13    BUN/Creat Ratio 12 - 28   13    Sodium 135 - 145 mmol/L 138   136   141    Potassium 3.5 - 5.1 mmol/L 4.4   4.2   5.6    Chloride 98 - 111 mmol/L 105   106   109    CO2 22 - 32 mmol/L _1 Calcium 8.9 - 10.3 mg/dL 10.1   9.0   9.8     Chest imaging: CXR 09/20/2021 Trachea is midline.   Cardiomediastinal contours and hilar structures are stable. Lung volumes are diminished. LEFT lower lobe airspace disease is noted. No gross interstitial thickening. No sign of pleural effusion. Added density over the spine on the lateral view.  PFT:     View : No data to display.          Labs:  Path:  Echo:  Heart Catheterization:  Assessment & Plan:   Chronic cough  Shortness of breath - Plan: Pulmonary Function Test  Allergic rhinitis due to pollen, unspecified seasonality - Plan: fluticasone (FLONASE) 50 MCG/ACT nasal spray  Former smoker - Plan: Ambulatory Referral for Lung Cancer Scre  Discussion: Kara Kelley is a 66 year old woman, former smoker with history of DMII and GERD who is referred to pulmonary clinic for asthma.   Her chronic cough may be related to postnasal drainage, GERD or side effect of her lisinopril.  She is to continue taking omeprazole daily and we discussed elevating the head of her bed to reduce nocturnal reflux.  We also discussed eating at least 2 hours prior to bedtime.  She is to speak with her primary care team about switching off lisinopril to a different blood pressure agent.  She is to start fluticasone nasal spray daily for her postnasal drainage.  We will refer her to our lung cancer screening team for further monitoring.  Follow-up in 3 months with pulmonary function test.  Freda Jackson, MD [facility] Office: [phone]   Current Outpatient Medications:    Accu-Chek FastClix Lancets MISC, Use as directed to check blood sugars 2 times  per day dx: e11.65, Disp: 102 each, Rfl: prn   Accu-Chek Softclix Lancets lancets, Use as instructed to check blood sugars twice daily E11.69, Disp: 100 each, Rfl: 2   albuterol (VENTOLIN HFA) 108 (90 Base) MCG/ACT inhaler, Inhale 2 puffs into the lungs every 6 (six) hours  as needed for wheezing or shortness of breath., Disp: 18 g, Rfl: 2   ARIPiprazole (ABILIFY) 10 MG tablet, Take 10 mg by mouth daily. , Disp: , Rfl:    atorvastatin (LIPITOR) 10 MG tablet, Take 1 tablet (10 mg total) by mouth daily., Disp: 90 tablet, Rfl: 1   Blood Glucose Monitoring Suppl (ACCU-CHEK GUIDE ME) w/Device KIT, Use to check blood sugars twice daily E11.69, Disp: 1 kit, Rfl: 1   Blood Glucose Monitoring Suppl (ACCU-CHEK NANO SMARTVIEW) w/Device KIT, Use as directed to check blood sugars 2 times per day dx: e11.65, Disp: 1 kit, Rfl: 0   buPROPion (WELLBUTRIN XL) 300 MG 24 hr tablet, Take 1 tablet (300 mg total) by mouth daily., Disp: 90 tablet, Rfl: 0   fluticasone (FLONASE) 50 MCG/ACT nasal spray, Place 1 spray into both nostrils daily., Disp: 16 g, Rfl: 2   Glucagon (GVOKE HYPOPEN 2-PACK) 1 MG/0.2ML SOAJ, Inject 1 each into the skin as needed. If blood sugar less than 60, Disp: 0.4 mL, Rfl: 3   glucose blood (ACCU-CHEK GUIDE) test strip, Use as directed to check blood sugars 2 times per day dx: e11.69, Disp: 300 strip, Rfl: 2   hydrOXYzine (ATARAX) 25 MG tablet, Take 1 tablet (25 mg total) by mouth every 8 (eight) hours as needed for anxiety., Disp: 30 tablet, Rfl: 1   insulin detemir (LEVEMIR FLEXTOUCH) 100 UNIT/ML FlexPen, Inject 32 Units into the skin at bedtime., Disp: 30 mL, Rfl: 0   Insulin Pen Needle (BD PEN NEEDLE NANO 2ND GEN) 32G X 4 MM MISC, Inject 1 each into the skin daily. as directed, Disp: 200 each, Rfl: 0   LINZESS 145 MCG CAPS capsule, Take 145 mcg by mouth daily before breakfast. , Disp: , Rfl:    lisinopril (ZESTRIL) 10 MG tablet, TAKE 1 TABLET(10 MG) BY MOUTH DAILY, Disp: 90 tablet, Rfl: 0    metFORMIN (GLUCOPHAGE-XR) 500 MG 24 hr tablet, Take 1 tablet (500 mg total) by mouth daily with breakfast., Disp: 30 tablet, Rfl: 1   methimazole (TAPAZOLE) 5 MG tablet, Take 1 tablet (5 mg total) by mouth daily., Disp: 90 tablet, Rfl: 1   Multiple Vitamins-Minerals (MULTIVITAMIN WITH MINERALS) tablet, Take 1 tablet by mouth daily. 50 +, Disp: , Rfl:    NOVOLOG FLEXPEN 100 UNIT/ML FlexPen, Inject 18 Units into the skin 3 (three) times daily with meals., Disp: 15 mL, Rfl: 3   omeprazole (PRILOSEC) 40 MG capsule, TAKE 1 CAPSULE(40 MG) BY MOUTH DAILY BEFORE BREAKFAST, Disp: 90 capsule, Rfl: 1   oxyCODONE-acetaminophen (PERCOCET) 5-325 MG tablet, Take 1 tablet by mouth every 4 (four) hours as needed., Disp: 30 tablet, Rfl: 0   Semaglutide, 1 MG/DOSE, (OZEMPIC, 1 MG/DOSE,) 4 MG/3ML SOPN, Inject 1 mg into the skin once a week., Disp: 9 mL, Rfl: 0   spironolactone (ALDACTONE) 50 MG tablet, TAKE 1 TABLET(50 MG) BY MOUTH DAILY, Disp: 90 tablet, Rfl: 0   traZODone (DESYREL) 150 MG tablet, Take 150 mg by mouth at bedtime. , Disp: , Rfl:

## 2022-02-23 NOTE — Patient Instructions (Addendum)
Recommend elevating the head of the bed 4-6 inches to reduce night time reflux disease. You could also try  wedge pillow.  ? ?Recommend eating dinner/snacks at least 2 hours before bedtime ? ?Continue to take omeprazole, 30 minutes before breakfast ? ?Recommend talking to your primary care doctor about switching off lisinopril as this can cause cough.  ? ?Use fluticasone nasal spray, 1 spray per nostril daily for nasal congestion and drainage ? ?We will refer you to our lung cancer screening team.  ? ?Continue albuterol inhaler as needed ? ?Follow up in 3 months with pulmonary function tests ? ? ?

## 2022-02-24 ENCOUNTER — Encounter: Payer: Self-pay | Admitting: Pulmonary Disease

## 2022-02-25 ENCOUNTER — Other Ambulatory Visit: Payer: Self-pay

## 2022-02-25 DIAGNOSIS — R519 Headache, unspecified: Secondary | ICD-10-CM

## 2022-02-25 DIAGNOSIS — M62838 Other muscle spasm: Secondary | ICD-10-CM

## 2022-02-25 MED ORDER — CYCLOBENZAPRINE HCL 5 MG PO TABS: 5.0000 mg | ORAL_TABLET | Freq: Three times a day (TID) | ORAL | 0 refills | Status: DC | PRN

## 2022-03-04 ENCOUNTER — Ambulatory Visit: Payer: Medicaid Other | Admitting: Podiatry

## 2022-03-14 NOTE — Progress Notes (Signed)
Erroneous encounter-disregard

## 2022-03-21 ENCOUNTER — Encounter: Payer: Medicaid Other | Admitting: Family

## 2022-03-21 DIAGNOSIS — I10 Essential (primary) hypertension: Secondary | ICD-10-CM

## 2022-03-21 DIAGNOSIS — E1122 Type 2 diabetes mellitus with diabetic chronic kidney disease: Secondary | ICD-10-CM

## 2022-03-24 ENCOUNTER — Ambulatory Visit: Payer: Medicaid Other | Admitting: Podiatry

## 2022-04-05 ENCOUNTER — Other Ambulatory Visit: Payer: Self-pay | Admitting: Otolaryngology

## 2022-04-05 DIAGNOSIS — R1314 Dysphagia, pharyngoesophageal phase: Secondary | ICD-10-CM | POA: Insufficient documentation

## 2022-04-07 ENCOUNTER — Telehealth: Payer: Medicaid Other | Admitting: Physician Assistant

## 2022-04-07 ENCOUNTER — Ambulatory Visit: Payer: Medicaid Other | Admitting: Podiatry

## 2022-04-07 ENCOUNTER — Ambulatory Visit: Payer: Self-pay

## 2022-04-07 NOTE — Progress Notes (Signed)
Contacted by this provider via MyChart and [facility] RN via phone to have her evaluated in person giving limited examination on a video and her severe abdominal symptoms.

## 2022-04-07 NOTE — Telephone Encounter (Signed)
  Chief Complaint: abdominal pain Symptoms: lower abdominal pain, cramping, intermittent 8/10, nausea, vomiting, constipated Frequency: 1 week Pertinent Negatives: Patient denies diarrhea Disposition: [] ED /[] Urgent Care (no appt availability in office) / [] Appointment(In office/virtual)/ [x]  [place] [facility]/ [] Home Care/ [] Refused Recommended Disposition /[] [place] [facility]/ []  Follow-up with PCP Additional Notes: pt states her symptoms started at beginning of week. Vomiting occurred Tuesday. LBM was yesterday with hard stools. Able to pass gas. Advised no appts available with practice. Scheduled virtual UC today at 1600.   Reason for Disposition  [1] MODERATE pain (e.g., interferes with normal activities) AND [2] pain comes and goes (cramps) AND [3] present > 24 hours  (Exception: pain with Vomiting or Diarrhea - see that Guideline)  Answer Assessment - Initial Assessment Questions 1. LOCATION: "Where does it hurt?"      Mainly at bottom  3. ONSET: "When did the pain begin?" (e.g., minutes, hours or days ago)      1 week  5. PATTERN "Does the pain come and go, or is it constant?"    - If constant: "Is it getting better, staying the same, or worsening?"      (Note: Constant means the pain never goes away completely; most serious pain is constant and it progresses)     - If intermittent: "How long does it last?" "Do you have pain now?"     (Note: Intermittent means the pain goes away completely between bouts)     Comes and goes 6. SEVERITY: "How bad is the pain?"  (e.g., Scale 1-10; mild, moderate, or severe)   - MILD (1-3): doesn't interfere with normal activities, abdomen soft and not tender to touch    - MODERATE (4-7): interferes with normal activities or awakens from sleep, abdomen tender to touch    - SEVERE (8-10): excruciating pain, doubled over, unable to do any normal activities      8 10. OTHER SYMPTOMS: "Do you have any other symptoms?" (e.g., back pain, diarrhea,  fever, urination pain, vomiting)       Vomting and nausea, started Tuesday  Protocols used: Abdominal Pain - [facility]

## 2022-04-07 NOTE — Telephone Encounter (Signed)
Called pt back d/t getting secure message from [place], [place] with virtual UC team. Based on pt's symptoms and pain level he advised pt that she needs in person eval and sent pt a Mychart message with the recommendation. I advised him I called pt and left detailed VM as well for pt go to UC to be seen and if any questions could call practice back.

## 2022-04-07 NOTE — Telephone Encounter (Signed)
Evisit scheduled for 04/07/22

## 2022-04-09 DIAGNOSIS — N1832 Chronic kidney disease, stage 3b: Secondary | ICD-10-CM | POA: Insufficient documentation

## 2022-04-11 ENCOUNTER — Encounter: Payer: Self-pay | Admitting: Family

## 2022-04-11 ENCOUNTER — Ambulatory Visit: Payer: Self-pay | Admitting: *Deleted

## 2022-04-11 ENCOUNTER — Ambulatory Visit: Payer: Medicaid Other | Admitting: Physician Assistant

## 2022-04-11 ENCOUNTER — Encounter: Payer: Medicaid Other | Admitting: Family

## 2022-04-11 DIAGNOSIS — Z860101 Personal history of adenomatous and serrated colon polyps: Secondary | ICD-10-CM

## 2022-04-11 DIAGNOSIS — K8020 Calculus of gallbladder without cholecystitis without obstruction: Secondary | ICD-10-CM | POA: Insufficient documentation

## 2022-04-11 DIAGNOSIS — E119 Type 2 diabetes mellitus without complications: Secondary | ICD-10-CM | POA: Insufficient documentation

## 2022-04-11 DIAGNOSIS — Z794 Long term (current) use of insulin: Secondary | ICD-10-CM | POA: Insufficient documentation

## 2022-04-11 DIAGNOSIS — R1031 Right lower quadrant pain: Secondary | ICD-10-CM | POA: Insufficient documentation

## 2022-04-11 DIAGNOSIS — K5904 Chronic idiopathic constipation: Secondary | ICD-10-CM | POA: Insufficient documentation

## 2022-04-11 DIAGNOSIS — E1165 Type 2 diabetes mellitus with hyperglycemia: Secondary | ICD-10-CM

## 2022-04-11 DIAGNOSIS — K8000 Calculus of gallbladder with acute cholecystitis without obstruction: Secondary | ICD-10-CM | POA: Insufficient documentation

## 2022-04-11 DIAGNOSIS — Z86010 Personal history of colonic polyps: Secondary | ICD-10-CM | POA: Insufficient documentation

## 2022-04-11 DIAGNOSIS — F1910 Other psychoactive substance abuse, uncomplicated: Secondary | ICD-10-CM | POA: Insufficient documentation

## 2022-04-11 HISTORY — DX: Calculus of gallbladder with acute cholecystitis without obstruction: K80.00

## 2022-04-11 HISTORY — DX: Calculus of gallbladder without cholecystitis without obstruction: K80.20

## 2022-04-11 HISTORY — DX: Personal history of adenomatous and serrated colon polyps: Z86.0101

## 2022-04-11 HISTORY — DX: Personal history of colonic polyps: Z86.010

## 2022-04-11 NOTE — Progress Notes (Signed)
Erroneous encounter-disregard

## 2022-04-11 NOTE — Progress Notes (Signed)
Acute Office Visit  Subjective:     Patient ID: Kara Kelley, female    DOB: 16-Feb-1956, 66 y.o.   MRN: [id number]  No chief complaint on file.   HPI Patient is in today for abdominal pain going on for approx 2 days. Symptoms include cramping, nausea and vomiting.    ROS      Objective:    There were no vitals taken for this visit.   Physical Exam  No results found for any visits on 04/11/22.      Assessment & Plan:   Problem List Items Addressed This Visit   None   No orders of the defined types were placed in this encounter.   No follow-ups on file.  Margorie John, [name]

## 2022-04-11 NOTE — Telephone Encounter (Signed)
  Chief Complaint: abd pain Symptoms: shoots across abd Frequency: constant Pertinent Negatives: Patient denies fever Disposition: [x] ED /[] Urgent Care (no appt availability in office) / [] Appointment(In office/virtual)/ []  [place] [facility]/ [] Home Care/ [] Refused Recommended Disposition /[] [place] [facility]/ []  Follow-up with PCP Additional Notes: Pt states she is going to get a ride to the ED or UC. I am not sure she will. She called last week and was told to go to UC and did not. I encouraged her and reiterated the importance of going.    Reason for Disposition  [1] SEVERE pain AND [2] age > 60 years  Answer Assessment - Initial Assessment Questions 1. LOCATION: "Where does it hurt?"      Mid abd 2. RADIATION: "Does the pain shoot anywhere else?" (e.g., chest, back)     sides 3. ONSET: "When did the pain begin?" (e.g., minutes, hours or days ago)      A week ago 4. SUDDEN: "Gradual or sudden onset?"     sudden 5. PATTERN "Does the pain come and go, or is it constant?"    - If constant: "Is it getting better, staying the same, or worsening?"      (Note: Constant means the pain never goes away completely; most serious pain is constant and it progresses)     - If intermittent: "How long does it last?" "Do you have pain now?"     (Note: Intermittent means the pain goes away completely between bouts)     constant 6. SEVERITY: "How bad is the pain?"  (e.g., Scale 1-10; mild, moderate, or severe)   - MILD (1-3): doesn't interfere with normal activities, abdomen soft and not tender to touch    - MODERATE (4-7): interferes with normal activities or awakens from sleep, abdomen tender to touch    - SEVERE (8-10): excruciating pain, doubled over, unable to do any normal activities      8 7. RECURRENT SYMPTOM: "Have you ever had this type of stomach pain before?" If Yes, ask: "When was the last time?" and "What happened that time?"      She called about it last week and was asked  to go to UC 8. CAUSE: "What do you think is causing the stomach pain?"     Unknown had bm yesterday and day before 9. RELIEVING/AGGRAVATING FACTORS: "What makes it better or worse?" (e.g., movement, antacids, bowel movement)      10. OTHER SYMPTOMS: "Do you have any other symptoms?" (e.g., back pain, diarrhea, fever, urination pain, vomiting)       Pt was vomiting last week, not now, somewhat nauseated. 11. PREGNANCY: "Is there any chance you are pregnant?" "When was your last menstrual period?"       na  Protocols used: Abdominal Pain - [facility]

## 2022-04-11 NOTE — Telephone Encounter (Signed)
Telephone visit scheduled for pt today 07/03

## 2022-04-14 ENCOUNTER — Other Ambulatory Visit: Payer: Self-pay | Admitting: Physician Assistant

## 2022-04-14 ENCOUNTER — Ambulatory Visit: Payer: Self-pay

## 2022-04-14 DIAGNOSIS — N1832 Chronic kidney disease, stage 3b: Secondary | ICD-10-CM

## 2022-04-14 NOTE — Telephone Encounter (Signed)
This encounter was created in error - please disregard.

## 2022-04-14 NOTE — Telephone Encounter (Signed)
  Chief Complaint: Abdominal pain  Symptoms: Mid and lower abdominal pain Frequency: 2+ weeks ago Pertinent Negatives: Patient denies fever, vomiting Disposition: [] ED /[x] Urgent Care (no appt availability in office) / [] Appointment(In office/virtual)/ []  [place] [facility]/ [] Home Care/ [] Refused Recommended Disposition /[] [place] [facility]/ []  Follow-up with PCP Additional Notes: Pt has had this pain for 2+ weeks. PT also needs to review current medications with provider. Pt cancelled virtual appt. To be seen in office.   Reason for Disposition  [1] MODERATE pain (e.g., interferes with normal activities) AND [2] pain comes and goes (cramps) AND [3] present > 24 hours  (Exception: pain with Vomiting or Diarrhea - see that Guideline)  Answer Assessment - Initial Assessment Questions 1. LOCATION: "Where does it hurt?"      Lower and middle abdomen 2. RADIATION: "Does the pain shoot anywhere else?" (e.g., chest, back)     no 3. ONSET: "When did the pain begin?" (e.g., minutes, hours or days ago)      2+ weeks ago 4. SUDDEN: "Gradual or sudden onset?"     sudden 5. PATTERN "Does the pain come and go, or is it constant?"    - If constant: "Is it getting better, staying the same, or worsening?"      (Note: Constant means the pain never goes away completely; most serious pain is constant and it progresses)     - If intermittent: "How long does it last?" "Do you have pain now?"     (Note: Intermittent means the pain goes away completely between bouts)     Intermittent 6. SEVERITY: "How bad is the pain?"  (e.g., Scale 1-10; mild, moderate, or severe)   - MILD (1-3): doesn't interfere with normal activities, abdomen soft and not tender to touch    - MODERATE (4-7): interferes with normal activities or awakens from sleep, abdomen tender to touch    - SEVERE (8-10): excruciating pain, doubled over, unable to do any normal activities      8/10 7. RECURRENT SYMPTOM: "Have you ever had  this type of stomach pain before?" If Yes, ask: "When was the last time?" and "What happened that time?"      no 8. CAUSE: "What do you think is causing the stomach pain?"     unsure 9. RELIEVING/AGGRAVATING FACTORS: "What makes it better or worse?" (e.g., movement, antacids, bowel movement)     no 10. OTHER SYMPTOMS: "Do you have any other symptoms?" (e.g., back pain, diarrhea, fever, urination pain, vomiting)       Hard stool 11. PREGNANCY: "Is there any chance you are pregnant?" "When was your last menstrual period?"       no  Protocols used: Abdominal Pain - [facility]

## 2022-04-14 NOTE — Telephone Encounter (Signed)
virtual was offered and pt accepted but did not answer call when provider called so would need to wait till 07/17

## 2022-04-15 NOTE — Telephone Encounter (Signed)
Requested Prescriptions  Pending Prescriptions Disp Refills  . metFORMIN (GLUCOPHAGE-XR) 500 MG 24 hr tablet [Pharmacy Med Name: METFORMIN ER 500MG 24HR TABS] 30 tablet 1    Sig: TAKE 1 TABLET(500 MG) BY MOUTH DAILY WITH BREAKFAST     Endocrinology:  Diabetes - Biguanides Failed - 04/14/2022  7:33 PM      Failed - Cr in normal range and within 360 days    Creat  Date Value Ref Range Status  09/23/2019 1.32 (H) 0.50 - 0.99 mg/dL Final    Comment:    For patients >53 years of age, the reference limit for Creatinine is approximately 13% higher for people identified as African-American. .    Creatinine, Ser  Date Value Ref Range Status  10/19/2021 1.01 (H) 0.44 - 1.00 mg/dL Final   Creatinine, POC  Date Value Ref Range Status  06/23/2020 100 mg/dL Final   Creatinine, Urine  Date Value Ref Range Status  07/17/2017 65.11 mg/dL Final    Comment:    Performed at [facility], [place] [address], [place], [place] [address]         Failed - B12 Level in normal range and within 720 days    Vitamin B-12  Date Value Ref Range Status  07/19/2017 1,450 (H) 180 - 914 pg/mL Final    Comment:    (NOTE) This assay is not validated for testing neonatal or myeloproliferative syndrome specimens for Vitamin B12 levels.          Passed - HBA1C is between 0 and 7.9 and within 180 days    Hemoglobin A1C  Date Value Ref Range Status  12/28/2021 7.2 (A) 4.0 - 5.6 % Final   Hgb A1c MFr Bld  Date Value Ref Range Status  10/19/2021 6.9 (H) 4.8 - 5.6 % Final    Comment:    (NOTE) Pre diabetes:          5.7%-6.4%  Diabetes:              >6.4%  Glycemic control for   <7.0% adults with diabetes          Passed - eGFR in normal range and within 360 days    GFR, Est African American  Date Value Ref Range Status  09/23/2019 50 (L) > OR = 60 mL/min/1.29m Final   GFR calc Af Amer  Date Value Ref Range Status  11/26/2020 55 (L) >59 mL/min/1.73 Final    Comment:    **In accordance with  recommendations from the NKF-ASN Task force,**   [facility] is in the process of updating its eGFR calculation to the   2021 CKD-EPI creatinine equation that estimates kidney function   without a race variable.    GFR, Est Non African American  Date Value Ref Range Status  09/23/2019 43 (L) > OR = 60 mL/min/1.763mFinal   GFR, Estimated  Date Value Ref Range Status  10/19/2021 >60 >60 mL/min Final    Comment:    (NOTE) Calculated using the CKD-EPI Creatinine Equation (2021)    eGFR  Date Value Ref Range Status  06/29/2021 55 (L) >59 mL/min/1.73 Final         Passed - Valid encounter within last 6 months    Recent Outpatient Visits          2 months ago Type 2 diabetes mellitus with stage 3b chronic kidney disease, with [facility]  [facility]  3 months ago Annual physical exam   Primary Care at [facility], [place], NP   4 months ago Encounter for completion of form with patient   Primary Care at [facility], Amy J, NP   5 months ago Encounter to establish care   Primary Care at [facility], Amy J, NP   11 months ago    [place], [place]      Future Appointments            In 1 week Camillia Herter, NP Primary Care at [facility]   In 1 month Erin Fulling, Cheryle Horsfall, MD [facility]           Passed - CBC within normal limits and completed in the last 12 months    WBC  Date Value Ref Range Status  12/28/2021 6.0 3.4 - 10.8 x10E3/uL Final  10/19/2021 6.0 4.0 - 10.5 K/uL Final   RBC  Date Value Ref Range Status  12/28/2021 4.01 3.77 - 5.28 x10E6/uL Final  10/19/2021 4.08 3.87 - 5.11 MIL/uL Final   Hemoglobin  Date Value Ref Range Status  12/28/2021 9.6 (L) 11.1 - 15.9 g/dL Final   Hematocrit  Date Value Ref Range Status  12/28/2021 30.5 (L) 34.0 - 46.6 % Final   MCHC  Date Value Ref Range Status   12/28/2021 31.5 31.5 - 35.7 g/dL Final  10/19/2021 31.8 30.0 - 36.0 g/dL Final   [facility]  Date Value Ref Range Status  12/28/2021 23.9 (L) 26.6 - 33.0 pg Final  10/19/2021 24.5 (L) 26.0 - 34.0 pg Final   MCV  Date Value Ref Range Status  12/28/2021 76 (L) 79 - 97 fL Final   No results found for: "PLTCOUNTKUC", "LABPLAT", "POCPLA" RDW  Date Value Ref Range Status  12/28/2021 15.4 11.7 - 15.4 % Final

## 2022-04-16 ENCOUNTER — Emergency Department (HOSPITAL_COMMUNITY)
Admission: EM | Admit: 2022-04-16 | Discharge: 2022-04-16 | Discharge status: Against Medical Advice | Payer: No Typology Code available for payment source | Source: Home / Self Care

## 2022-04-18 NOTE — Progress Notes (Signed)
Erroneous encounter-disregard

## 2022-04-19 ENCOUNTER — Ambulatory Visit: Payer: Medicaid Other | Admitting: Internal Medicine

## 2022-04-22 ENCOUNTER — Other Ambulatory Visit: Payer: Self-pay

## 2022-04-22 DIAGNOSIS — Z122 Encounter for screening for malignant neoplasm of respiratory organs: Secondary | ICD-10-CM

## 2022-04-22 DIAGNOSIS — Z87891 Personal history of nicotine dependence: Secondary | ICD-10-CM

## 2022-04-25 ENCOUNTER — Ambulatory Visit: Payer: Medicaid Other | Admitting: Family

## 2022-04-25 ENCOUNTER — Encounter: Payer: Medicaid Other | Admitting: Family

## 2022-04-25 DIAGNOSIS — K219 Gastro-esophageal reflux disease without esophagitis: Secondary | ICD-10-CM

## 2022-04-25 DIAGNOSIS — R109 Unspecified abdominal pain: Secondary | ICD-10-CM

## 2022-04-26 ENCOUNTER — Ambulatory Visit (INDEPENDENT_AMBULATORY_CARE_PROVIDER_SITE_OTHER): Payer: Medicaid Other | Admitting: Podiatry

## 2022-04-26 ENCOUNTER — Ambulatory Visit (INDEPENDENT_AMBULATORY_CARE_PROVIDER_SITE_OTHER): Payer: Medicaid Other

## 2022-04-26 DIAGNOSIS — M722 Plantar fascial fibromatosis: Secondary | ICD-10-CM

## 2022-04-26 DIAGNOSIS — M2042 Other hammer toe(s) (acquired), left foot: Secondary | ICD-10-CM | POA: Diagnosis not present

## 2022-04-26 DIAGNOSIS — M21619 Bunion of unspecified foot: Secondary | ICD-10-CM

## 2022-04-26 NOTE — Progress Notes (Deleted)
Patient ID: Kara Kelley, female    DOB: 08/05/1956  MRN: [id number]  CC: Abdominal Pain   Subjective: Kara Kelley is a 66 y.o. female who presents for abdominal pain.   Her concerns today include:  Referred to [facility] March 2023 for colon can   From [facility] 01/31/2022  Dear Kara Kelley, We hope this letter finds you well, despite repeated attempts we have been unable to reach you by telephone.    We received a referral for you from Dr. [name] .  We have been unable to reach you to schedule an appointment with one of our providers at [facility].  If you are still interested in scheduling an appointment, please call [phone].  We are here Monday-Friday 8 am-5 pm to assist you in your scheduling needs.     Estab with Gertie Fey in the past 2015 for GERD, constipation   Patient Active Problem List   Diagnosis Date Noted   Cholelithiasis without obstruction 04/11/2022   Chronic idiopathic constipation 04/11/2022   Gallbladder calculus with acute cholecystitis and no obstruction 04/11/2022   History of adenomatous polyp of colon 04/11/2022   Morbid obesity ([place]) 04/11/2022   Polysubstance abuse ([place]) 04/11/2022   Right lower quadrant pain 04/11/2022   Type 2 diabetes mellitus ([place]) 04/11/2022   Type II diabetes mellitus ([place]) 04/11/2022   Pharyngoesophageal dysphagia 04/05/2022   Hyperthyroidism 12/29/2021   Cervical spondylosis with myelopathy and radiculopathy 10/26/2021   Bilateral impacted cerumen 09/16/2020   Itching of ear 09/16/2020   Heartburn 07/10/2020   Lumbar radiculopathy 04/03/2020   Hearing loss of right ear 03/12/2020   Plantar flexed metatarsal bone of left foot 08/14/2019   Skin burn 07/09/2019   Uncontrolled type 2 diabetes mellitus with hyperglycemia ([place]) 06/19/2019   Hypertensive retinopathy of both eyes 03/06/2019   Mild nonproliferative diabetic retinopathy of both eyes ([place]) 03/06/2019   Essential hypertension  10/05/2018   Cramp and spasm 07/20/2018   Lactic acidosis    SIRS (systemic inflammatory response syndrome) ([place])    Acute renal failure ([place])    Type 2 diabetes mellitus with stage 3b chronic kidney disease, without long-term current use of insulin ([place])    Acute blood loss anemia    Sepsis ([place]) 07/17/2017   Hyponatremia 07/17/2017   Postoperative wound infection 07/17/2017   AKI (acute kidney injury) ([place]) 07/17/2017   Dehydration 07/17/2017   DM type 2 (diabetes mellitus, type 2) ([place]) 07/17/2017   Acute encephalopathy 07/17/2017   Degenerative spondylolisthesis 07/03/2017   Post-menopausal bleeding 06/20/2017   Chronic hepatitis C ([place]) 12/09/2015   Inclusion cyst 09/16/2014   Onychomycosis 08/15/2014   Type I (juvenile type) diabetes mellitus with renal manifestations, not stated as uncontrolled(250.41) 06/10/2014   Unspecified constipation 05/22/2014   Gastroesophageal reflux disease without esophagitis 05/22/2014   Pain in lower limb 01/29/2014   Eschar of multiple sites 07/31/2013   Porokeratosis 07/02/2013   Cyst of joint of ankle or foot 06/04/2013   History of endometrial ablation 02/06/2013   Pain in joint, ankle and foot 01/23/2013   Callus of foot 01/23/2013   Ingrown nail 01/23/2013   Deformity of metatarsal 01/23/2013     Current Outpatient Medications on File Prior to Visit  Medication Sig Dispense Refill   Accu-Chek FastClix Lancets MISC Use as directed to check blood sugars 2 times per day dx: e11.65 102 each prn   Accu-Chek Softclix Lancets lancets Use as instructed to check blood sugars twice daily E11.69 100 each  2   albuterol (VENTOLIN HFA) 108 (90 Base) MCG/ACT inhaler Inhale 2 puffs into the lungs every 6 (six) hours as needed for wheezing or shortness of breath. 18 g 2   ARIPiprazole (ABILIFY) 10 MG tablet Take 10 mg by mouth daily.      atorvastatin (LIPITOR) 10 MG tablet Take 1 tablet (10 mg total) by mouth daily. 90 tablet 1   Blood Glucose  Monitoring Suppl (ACCU-CHEK GUIDE ME) w/Device KIT Use to check blood sugars twice daily E11.69 1 kit 1   Blood Glucose Monitoring Suppl (ACCU-CHEK NANO SMARTVIEW) w/Device KIT Use as directed to check blood sugars 2 times per day dx: e11.65 1 kit 0   buPROPion (WELLBUTRIN XL) 300 MG 24 hr tablet Take 1 tablet (300 mg total) by mouth daily. 90 tablet 0   cyclobenzaprine (FLEXERIL) 5 MG tablet Take 1 tablet (5 mg total) by mouth 3 (three) times daily as needed for muscle spasms. 90 tablet 0   fluticasone (FLONASE) 50 MCG/ACT nasal spray Place 1 spray into both nostrils daily. 16 g 2   Glucagon (GVOKE HYPOPEN 2-PACK) 1 MG/0.2ML SOAJ Inject 1 each into the skin as needed. If blood sugar less than 60 0.4 mL 3   glucose blood (ACCU-CHEK GUIDE) test strip Use as directed to check blood sugars 2 times per day dx: e11.69 300 strip 2   hydrOXYzine (ATARAX) 25 MG tablet Take 1 tablet (25 mg total) by mouth every 8 (eight) hours as needed for anxiety. 30 tablet 1   insulin detemir (LEVEMIR FLEXTOUCH) 100 UNIT/ML FlexPen Inject 32 Units into the skin at bedtime. 30 mL 0   Insulin Pen Needle (BD PEN NEEDLE NANO 2ND GEN) 32G X 4 MM MISC Inject 1 each into the skin daily. as directed 200 each 0   LINZESS 145 MCG CAPS capsule Take 145 mcg by mouth daily before breakfast.      lisinopril (ZESTRIL) 10 MG tablet TAKE 1 TABLET(10 MG) BY MOUTH DAILY 90 tablet 0   metFORMIN (GLUCOPHAGE-XR) 500 MG 24 hr tablet TAKE 1 TABLET(500 MG) BY MOUTH DAILY WITH BREAKFAST 90 tablet 1   methimazole (TAPAZOLE) 5 MG tablet Take 1 tablet (5 mg total) by mouth daily. 90 tablet 1   Multiple Vitamins-Minerals (MULTIVITAMIN WITH MINERALS) tablet Take 1 tablet by mouth daily. 50 +     NOVOLOG FLEXPEN 100 UNIT/ML FlexPen Inject 18 Units into the skin 3 (three) times daily with meals. 15 mL 3   omeprazole (PRILOSEC) 40 MG capsule TAKE 1 CAPSULE(40 MG) BY MOUTH DAILY BEFORE BREAKFAST 90 capsule 1   oxyCODONE-acetaminophen (PERCOCET) 5-325 MG  tablet Take 1 tablet by mouth every 4 (four) hours as needed. 30 tablet 0   Semaglutide, 1 MG/DOSE, (OZEMPIC, 1 MG/DOSE,) 4 MG/3ML SOPN Inject 1 mg into the skin once a week. 9 mL 0   spironolactone (ALDACTONE) 50 MG tablet TAKE 1 TABLET(50 MG) BY MOUTH DAILY 90 tablet 0   traZODone (DESYREL) 150 MG tablet Take 150 mg by mouth at bedtime.      No current facility-administered medications on file prior to visit.    No Known Allergies  Social History   Socioeconomic History   Marital status: Single    Spouse name: Not on file   Number of children: 3   Years of education: Not on file   Highest education level: Not on file  Occupational History   Occupation: disablily  Tobacco Use   Smoking status: Former    Packs/day: 0.50  Types: Cigarettes    Quit date: 2020    Years since quitting: 3.5   Smokeless tobacco: Never  Vaping Use   Vaping Use: Never used  Substance and Sexual Activity   Alcohol use: No    Alcohol/week: 0.0 standard drinks of alcohol    Comment: in rehab   Drug use: No    Comment: in rehab   Sexual activity: Not Currently    Birth control/protection: Post-menopausal  Other Topics Concern   Not on file  Social History Narrative   Not on file   Social Determinants of Health   Financial Resource Strain: Medium Risk (06/23/2020)   Overall Financial Resource Strain (CARDIA)    Difficulty of Paying Living Expenses: Somewhat hard  Food Insecurity: No Food Insecurity (03/27/2020)   Hunger Vital Sign    Worried About Running Out of Food in the Last Year: Never true    Ran Out of Food in the Last Year: Never true  Transportation Needs: No Transportation Needs (03/27/2020)   PRAPARE - Hydrologist (Medical): No    Lack of Transportation (Non-Medical): No  Physical Activity: Not on file  Stress: Not on file  Social Connections: Not on file  Intimate Partner Violence: Not on file    Family History  Problem Relation Age of Onset    Heart disease Mother    Diabetes Other        mat great aunt   Cirrhosis Other        mat great aunt   Cirrhosis Other        mat great uncles x 2   Colon cancer Neg Hx    Breast cancer Neg Hx     Past Surgical History:  Procedure Laterality Date   ANTERIOR CERVICAL DECOMP/DISCECTOMY FUSION N/A 10/26/2021   Procedure: ACDF - C4-C5 - C5-C6 - C6-C7;  Surgeon: Earnie Larsson, MD;  Location: [facility];  Service: Neurosurgery;  Laterality: N/A;   BACK SURGERY  2018   CESAREAN SECTION     x3   CHOLECYSTECTOMY N/A 08/11/2016   Procedure: LAPAROSCOPIC CHOLECYSTECTOMY WITH   INTRAOPERATIVE CHOLANGIOGRAM;  Surgeon: Stark Klein, MD;  Location: [facility];  Service: General;  Laterality: N/A;   COLONOSCOPY     [place] Osteotomy w/ Graft Left 06/18/2009   Excision of Benign Lesion Right 01/30/2013   Rt Plantar   FOOT SURGERY     HAMMER TOE REPAIR Right 02/12/2016   RIGHT #5   Hammertoe Repair Left 06/18/2009   Lt #5   HYSTEROSCOPY N/A 06/20/2017   Procedure: DILATION AND CURETTAGE, HYSTEROSCOPY w/ Polypectomy;  Surgeon: Lavonia Drafts, MD;  Location: [facility];  Service: Gynecology;  Laterality: N/A;   LUMBAR FUSION  2018   METATARSAL OSTEOTOMY Left 06/18/2009   #5   MULTIPLE TOOTH EXTRACTIONS     Nail Matrixectomy Left 06/18/2009   LT #1   OSTEOTOMY Right 01/30/2013   Rt #5   Phalangectomy Left 06/18/2009   LT #1   Phalangectomy Right 01/30/2013   Rt #1   TUBAL LIGATION      ROS: Review of Systems Negative except as stated above  PHYSICAL EXAM: There were no vitals taken for this visit.  Physical Exam  {female adult master:310786} {female adult master:310785}     Latest Ref Rng & Units 10/19/2021   11:42 AM 07/20/2021    8:38 PM 06/29/2021   11:04 AM  CMP  Glucose 70 - 99 mg/dL 149  109  148  BUN 8 - 23 mg/dL _0 Creatinine 0.44 - 1.00 mg/dL 1.01  1.18  1.13   Sodium 135 - 145 mmol/L 138  136  141   Potassium 3.5 - 5.1 mmol/L 4.4  4.2  5.6   Chloride 98 - 111 mmol/L 105   106  109   CO2 22 - 32 mmol/L _1 Calcium 8.9 - 10.3 mg/dL 10.1  9.0  9.8   Total Protein 6.5 - 8.1 g/dL 6.7   7.1   Total Bilirubin 0.3 - 1.2 mg/dL 0.2   0.3   Alkaline Phos 38 - 126 U/L 82   129   AST 15 - 41 U/L 17   14   ALT 0 - 44 U/L 19   13    Lipid Panel     Component Value Date/Time   CHOL 126 12/28/2021 0902   TRIG 102 12/28/2021 0902   HDL 49 12/28/2021 0902   CHOLHDL 2.6 12/28/2021 0902   LDLCALC 58 12/28/2021 0902    CBC    Component Value Date/Time   WBC 6.0 12/28/2021 0902   WBC 6.0 10/19/2021 1142   RBC 4.01 12/28/2021 0902   RBC 4.08 10/19/2021 1142   HGB 9.6 (L) 12/28/2021 0902   HCT 30.5 (L) 12/28/2021 0902   PLT 186 12/28/2021 0902   MCV 76 (L) 12/28/2021 0902   MCH 23.9 (L) 12/28/2021 0902   MCH 24.5 (L) 10/19/2021 1142   MCHC 31.5 12/28/2021 0902   MCHC 31.8 10/19/2021 1142   RDW 15.4 12/28/2021 0902   LYMPHSABS 2.1 07/20/2021 2038   MONOABS 0.3 07/20/2021 2038   EOSABS 0.1 07/20/2021 2038   BASOSABS 0.0 07/20/2021 2038    ASSESSMENT AND PLAN:  There are no diagnoses linked to this encounter.   Patient was given the opportunity to ask questions.  Patient verbalized understanding of the plan and was able to repeat key elements of the plan. Patient was given clear instructions to go to Emergency Department or return to medical center if symptoms don't improve, worsen, or new problems develop.The patient verbalized understanding.   No orders of the defined types were placed in this encounter.    Requested Prescriptions    No prescriptions requested or ordered in this encounter    No follow-ups on file.  Camillia Herter, NP

## 2022-04-28 NOTE — Progress Notes (Signed)
Subjective: 66 year old female presents the office today for concerns of pain to her heels but also her main concern is pain to her left second toe the deformity is causing pain.  She tried shoe modification, offloading, padding without any significant resolution patient was discussed surgery options or other options to help with this.  She states the area that she had the fracture previously has been doing well.   Last A1c was 7.2 on December 28, 2021  Objective: AAO x3, NAD DP/PT pulses palpable bilaterally, CRT less than 3 seconds On the left foot there is no significant tenderness along the third metatarsal.  She does get tenderness to palpation on plantar medial tubercle of the calcaneus and insertion of plantar fascia bilaterally.  Her main concern today though is the pain on the left second toe.  There is deformity noted to the second toe with hallux abductus, bunion present.  Both of these are evidence that she is causing irritation. No pain with calf compression, erythema, warmth  Assessment: Bilateral foot pain, [place] fasciitis; left second toe deformity, bunion  Plan: -All treatment options discussed with the patient including all alternatives, risks, complications.  -Repeat x-rays obtained reviewed.  3 views of the bilateral feet  were obtained.  On the left foot the fracture appears to be consolidated.  Bunion is present monitor agree with hallux abductus present.  Lateral deviation of the lesser digits with hammertoe deformity mostly to the second toe. -Left foot third metatarsal fracture is doing well. -Regards to the bunion, left second toe deformity we discussed surgical versus conservative care.  She tried shoe modifications, offloading, padding without significant improvement she was to proceed with surgical intervention.  Discussed with her Joylene Grapes bunionectomy with hammertoe repair with K wire fixation of left second toe.  She was to proceed with this. -The incision  placement as well as the postoperative course was discussed with the patient. I discussed risks of the surgery which include, but not limited to, infection, bleeding, pain, swelling, need for further surgery, delayed or nonhealing, painful or ugly scar, numbness or sensation changes, over/under correction, recurrence, transfer lesions, further deformity, hardware failure, DVT/PE, loss of toe/foot. Patient understands these risks and wishes to proceed with surgery. The surgical consent was reviewed with the patient all 3 pages were signed. No promises or guarantees were given to the outcome of the procedure. All questions were answered to the best of my ability. Before the surgery the patient was encouraged to call the office if there is any further questions. The surgery will be performed at the [facility] on an outpatient basis. -In regards to the plantar fascia as well as the stretching, icing.  Discussed shoes and good arch support.   Vivi Barrack DPM

## 2022-05-06 ENCOUNTER — Ambulatory Visit: Payer: Self-pay

## 2022-05-06 ENCOUNTER — Ambulatory Visit: Payer: Medicaid Other | Admitting: Family

## 2022-05-06 DIAGNOSIS — R1084 Generalized abdominal pain: Secondary | ICD-10-CM

## 2022-05-06 DIAGNOSIS — K219 Gastro-esophageal reflux disease without esophagitis: Secondary | ICD-10-CM

## 2022-05-06 NOTE — Telephone Encounter (Signed)
[facility] agent attempted to connect pt. To triage and pt. Disconnected. Called back, unable to leave a message.

## 2022-05-09 NOTE — Progress Notes (Signed)
Patient ID: AARNA MIHALKO, female    DOB: 17-Sep-1956  MRN: [id number]  CC: Abdominal Pain   Subjective: Carrissa Taitano is a 66 y.o. female who presents for abdominal pain. She is accompanied by her daughter.   Her concerns today include:  Sharp stomach pain persisting and causing nausea. Reports she was unaware that Gastroenterology attempted to schedule an appointment with her. She is still taking Omeprazole. No further issues/concerns.   Patient Active Problem List   Diagnosis Date Noted   Cholelithiasis without obstruction 04/11/2022   Chronic idiopathic constipation 04/11/2022   Gallbladder calculus with acute cholecystitis and no obstruction 04/11/2022   History of adenomatous polyp of colon 04/11/2022   Morbid obesity ([place]) 04/11/2022   Polysubstance abuse ([place]) 04/11/2022   Right lower quadrant pain 04/11/2022   Type 2 diabetes mellitus ([place]) 04/11/2022   Type II diabetes mellitus ([place]) 04/11/2022   Pharyngoesophageal dysphagia 04/05/2022   Hyperthyroidism 12/29/2021   Cervical spondylosis with myelopathy and radiculopathy 10/26/2021   Bilateral impacted cerumen 09/16/2020   Itching of ear 09/16/2020   Heartburn 07/10/2020   Lumbar radiculopathy 04/03/2020   Hearing loss of right ear 03/12/2020   Plantar flexed metatarsal bone of left foot 08/14/2019   Skin burn 07/09/2019   Uncontrolled type 2 diabetes mellitus with hyperglycemia ([place]) 06/19/2019   Hypertensive retinopathy of both eyes 03/06/2019   Mild nonproliferative diabetic retinopathy of both eyes ([place]) 03/06/2019   Essential hypertension 10/05/2018   Cramp and spasm 07/20/2018   Lactic acidosis    SIRS (systemic inflammatory response syndrome) ([place])    Acute renal failure ([place])    Type 2 diabetes mellitus with stage 3b chronic kidney disease, without long-term current use of insulin ([place])    Acute blood loss anemia    Sepsis ([place]) 07/17/2017   Hyponatremia 07/17/2017   Postoperative wound infection  07/17/2017   AKI (acute kidney injury) ([place]) 07/17/2017   Dehydration 07/17/2017   DM type 2 (diabetes mellitus, type 2) ([place]) 07/17/2017   Acute encephalopathy 07/17/2017   Degenerative spondylolisthesis 07/03/2017   Post-menopausal bleeding 06/20/2017   Chronic hepatitis C ([place]) 12/09/2015   Inclusion cyst 09/16/2014   Onychomycosis 08/15/2014   Type I (juvenile type) diabetes mellitus with renal manifestations, not stated as uncontrolled(250.41) 06/10/2014   Unspecified constipation 05/22/2014   Gastroesophageal reflux disease without esophagitis 05/22/2014   Pain in lower limb 01/29/2014   Eschar of multiple sites 07/31/2013   Porokeratosis 07/02/2013   Cyst of joint of ankle or foot 06/04/2013   History of endometrial ablation 02/06/2013   Pain in joint, ankle and foot 01/23/2013   Callus of foot 01/23/2013   Ingrown nail 01/23/2013   Deformity of metatarsal 01/23/2013     Current Outpatient Medications on File Prior to Visit  Medication Sig Dispense Refill   Accu-Chek FastClix Lancets MISC Use as directed to check blood sugars 2 times per day dx: e11.65 102 each prn   Accu-Chek Softclix Lancets lancets Use as instructed to check blood sugars twice daily E11.69 100 each 2   albuterol (VENTOLIN HFA) 108 (90 Base) MCG/ACT inhaler Inhale 2 puffs into the lungs every 6 (six) hours as needed for wheezing or shortness of breath. 18 g 2   ARIPiprazole (ABILIFY) 10 MG tablet Take 10 mg by mouth daily.      atorvastatin (LIPITOR) 10 MG tablet Take 1 tablet (10 mg total) by mouth daily. 90 tablet 1   Blood Glucose Monitoring Suppl (ACCU-CHEK GUIDE ME) w/Device KIT  Use to check blood sugars twice daily E11.69 1 kit 1   Blood Glucose Monitoring Suppl (ACCU-CHEK NANO SMARTVIEW) w/Device KIT Use as directed to check blood sugars 2 times per day dx: e11.65 1 kit 0   buPROPion (WELLBUTRIN XL) 300 MG 24 hr tablet Take 1 tablet (300 mg total) by mouth daily. 90 tablet 0   cyclobenzaprine  (FLEXERIL) 5 MG tablet Take 1 tablet (5 mg total) by mouth 3 (three) times daily as needed for muscle spasms. 90 tablet 0   fluticasone (FLONASE) 50 MCG/ACT nasal spray Place 1 spray into both nostrils daily. 16 g 2   Glucagon (GVOKE HYPOPEN 2-PACK) 1 MG/0.2ML SOAJ Inject 1 each into the skin as needed. If blood sugar less than 60 0.4 mL 3   glucose blood (ACCU-CHEK GUIDE) test strip Use as directed to check blood sugars 2 times per day dx: e11.69 300 strip 2   hydrOXYzine (ATARAX) 25 MG tablet Take 1 tablet (25 mg total) by mouth every 8 (eight) hours as needed for anxiety. 30 tablet 1   insulin detemir (LEVEMIR FLEXTOUCH) 100 UNIT/ML FlexPen Inject 32 Units into the skin at bedtime. 30 mL 0   Insulin Pen Needle (BD PEN NEEDLE NANO 2ND GEN) 32G X 4 MM MISC Inject 1 each into the skin daily. as directed 200 each 0   LINZESS 145 MCG CAPS capsule Take 145 mcg by mouth daily before breakfast.      lisinopril (ZESTRIL) 10 MG tablet TAKE 1 TABLET(10 MG) BY MOUTH DAILY 90 tablet 0   metFORMIN (GLUCOPHAGE-XR) 500 MG 24 hr tablet TAKE 1 TABLET(500 MG) BY MOUTH DAILY WITH BREAKFAST 90 tablet 1   methimazole (TAPAZOLE) 5 MG tablet Take 1 tablet (5 mg total) by mouth daily. 90 tablet 1   Multiple Vitamins-Minerals (MULTIVITAMIN WITH MINERALS) tablet Take 1 tablet by mouth daily. 50 +     NOVOLOG FLEXPEN 100 UNIT/ML FlexPen Inject 18 Units into the skin 3 (three) times daily with meals. 15 mL 3   oxyCODONE-acetaminophen (PERCOCET) 5-325 MG tablet Take 1 tablet by mouth every 4 (four) hours as needed. 30 tablet 0   Semaglutide, 1 MG/DOSE, (OZEMPIC, 1 MG/DOSE,) 4 MG/3ML SOPN Inject 1 mg into the skin once a week. 9 mL 0   spironolactone (ALDACTONE) 50 MG tablet TAKE 1 TABLET(50 MG) BY MOUTH DAILY 90 tablet 0   traZODone (DESYREL) 150 MG tablet Take 150 mg by mouth at bedtime.      No current facility-administered medications on file prior to visit.    No Known Allergies  Social History   Socioeconomic  History   Marital status: Single    Spouse name: Not on file   Number of children: 3   Years of education: Not on file   Highest education level: Not on file  Occupational History   Occupation: disablily  Tobacco Use   Smoking status: Former    Packs/day: 0.75    Years: 47.00    Total pack years: 35.25    Types: Cigarettes    Quit date: 2020    Years since quitting: 3.5    Passive exposure: Past   Smokeless tobacco: Never  Vaping Use   Vaping Use: Never used  Substance and Sexual Activity   Alcohol use: No    Alcohol/week: 0.0 standard drinks of alcohol    Comment: in rehab   Drug use: No    Comment: in rehab   Sexual activity: Not Currently    Birth control/protection:  Post-menopausal  Other Topics Concern   Not on file  Social History Narrative   Not on file   Social Determinants of Health   Financial Resource Strain: Medium Risk (06/23/2020)   Overall Financial Resource Strain (CARDIA)    Difficulty of Paying Living Expenses: Somewhat hard  Food Insecurity: No Food Insecurity (03/27/2020)   Hunger Vital Sign    Worried About Running Out of Food in the Last Year: Never true    Ran Out of Food in the Last Year: Never true  Transportation Needs: No Transportation Needs (03/27/2020)   PRAPARE - Hydrologist (Medical): No    Lack of Transportation (Non-Medical): No  Physical Activity: Not on file  Stress: Not on file  Social Connections: Not on file  Intimate Partner Violence: Not on file    Family History  Problem Relation Age of Onset   Heart disease Mother    Diabetes Other        mat great aunt   Cirrhosis Other        mat great aunt   Cirrhosis Other        mat great uncles x 2   Colon cancer Neg Hx    Breast cancer Neg Hx     Past Surgical History:  Procedure Laterality Date   ANTERIOR CERVICAL DECOMP/DISCECTOMY FUSION N/A 10/26/2021   Procedure: ACDF - C4-C5 - C5-C6 - C6-C7;  Surgeon: Earnie Larsson, MD;  Location: [facility];   Service: Neurosurgery;  Laterality: N/A;   BACK SURGERY  2018   CESAREAN SECTION     x3   CHOLECYSTECTOMY N/A 08/11/2016   Procedure: LAPAROSCOPIC CHOLECYSTECTOMY WITH   INTRAOPERATIVE CHOLANGIOGRAM;  Surgeon: Stark Klein, MD;  Location: [facility];  Service: General;  Laterality: N/A;   COLONOSCOPY     [place] Osteotomy w/ Graft Left 06/18/2009   Excision of Benign Lesion Right 01/30/2013   Rt Plantar   FOOT SURGERY     HAMMER TOE REPAIR Right 02/12/2016   RIGHT #5   Hammertoe Repair Left 06/18/2009   Lt #5   HYSTEROSCOPY N/A 06/20/2017   Procedure: DILATION AND CURETTAGE, HYSTEROSCOPY w/ Polypectomy;  Surgeon: Lavonia Drafts, MD;  Location: [facility];  Service: Gynecology;  Laterality: N/A;   LUMBAR FUSION  2018   METATARSAL OSTEOTOMY Left 06/18/2009   #5   MULTIPLE TOOTH EXTRACTIONS     Nail Matrixectomy Left 06/18/2009   LT #1   OSTEOTOMY Right 01/30/2013   Rt #5   Phalangectomy Left 06/18/2009   LT #1   Phalangectomy Right 01/30/2013   Rt #1   TUBAL LIGATION      ROS: Review of Systems Negative except as stated above  PHYSICAL EXAM: BP 130/79 (BP Location: Right Arm, Patient Position: Sitting, Cuff Size: Normal)   Pulse 87   Temp 98.3 F (36.8 C)   Resp 16   Ht 4' 11.02" (1.499 m)   Wt 175 lb (79.4 kg)   SpO2 96%   BMI 35.33 kg/m   Physical Exam HENT:     Head: Normocephalic and atraumatic.  Eyes:     Extraocular Movements: Extraocular movements intact.     Conjunctiva/sclera: Conjunctivae normal.     Pupils: Pupils are equal, round, and reactive to light.  Cardiovascular:     Rate and Rhythm: Normal rate and regular rhythm.     Pulses: Normal pulses.     Heart sounds: Normal heart sounds.  Pulmonary:     Effort: Pulmonary effort  is normal.     Breath sounds: Normal breath sounds.  Musculoskeletal:     Cervical back: Normal range of motion and neck supple.  Neurological:     General: No focal deficit present.     Mental Status: She is alert and oriented to  person, place, and time.  Psychiatric:        Mood and Affect: Mood normal.        Behavior: Behavior normal.    Results for orders placed or performed in visit on 05/13/22  POCT URINALYSIS DIP (CLINITEK)  Result Value Ref Range   Color, UA yellow yellow   Clarity, UA cloudy (A) clear   Glucose, UA negative negative mg/dL   Bilirubin, UA negative negative   Ketones, POC UA negative negative mg/dL   Spec Grav, UA <=1.005 (A) 1.010 - 1.025   Blood, UA trace-intact (A) negative   pH, UA 5.5 5.0 - 8.0   POC PROTEIN,UA negative negative, trace   Urobilinogen, UA 0.2 0.2 or 1.0 E.U./dL   Nitrite, UA Negative Negative   Leukocytes, UA Small (1+) (A) Negative     ASSESSMENT AND PLAN: 1. Gastroesophageal reflux disease, unspecified whether esophagitis present - Continue Omeprazole as prescribed.  - Sucralfate as prescribed. Counseled on medication adherence and adverse effects. - Referral to Gastroenterology for further evaluation and management.  - Ambulatory referral to Gastroenterology - sucralfate (CARAFATE) 1 GM/10ML suspension; Take 10 mLs (1 g total) by mouth 3 (three) times daily with meals.  Dispense: 420 mL; Refill: 1 - omeprazole (PRILOSEC) 40 MG capsule; TAKE 1 CAPSULE(40 MG) BY MOUTH DAILY BEFORE BREAKFAST  Dispense: 90 capsule; Refill: 1  2. Chronic abdominal pain - Screening labs.  - Urinalysis no nitrites, trace leuks. Vaginal self-swab pending. - Referral to Gastroenterology for further evaluation and management.  - CMP14+EGFR - CBC - Lipase - Amylase - POCT URINALYSIS DIP (CLINITEK) - Cervicovaginal ancillary only - Ambulatory referral to Gastroenterology  3. Nausea without vomiting - Referral to Gastroenterology for further evaluation and management.  - Ambulatory referral to Gastroenterology  4. Colon cancer screening - Referral to Gastroenterology for further evaluation and management.  - Ambulatory referral to Gastroenterology   Patient was given the  opportunity to ask questions.  Patient verbalized understanding of the plan and was able to repeat key elements of the plan. Patient was given clear instructions to go to Emergency Department or return to medical center if symptoms don't improve, worsen, or new problems develop.The patient verbalized understanding.   Orders Placed This Encounter  Procedures   CMP14+EGFR   CBC   Lipase   Amylase   Ambulatory referral to Gastroenterology   POCT URINALYSIS DIP (CLINITEK)     Requested Prescriptions   Signed Prescriptions Disp Refills   sucralfate (CARAFATE) 1 GM/10ML suspension 420 mL 1    Sig: Take 10 mLs (1 g total) by mouth 3 (three) times daily with meals.   omeprazole (PRILOSEC) 40 MG capsule 90 capsule 1    Sig: TAKE 1 CAPSULE(40 MG) BY MOUTH DAILY BEFORE BREAKFAST    Follow-up with primary provider as scheduled.   Camillia Herter, NP

## 2022-05-10 ENCOUNTER — Ambulatory Visit: Payer: Medicaid Other | Admitting: Podiatry

## 2022-05-11 ENCOUNTER — Encounter: Payer: Self-pay | Admitting: Acute Care

## 2022-05-11 ENCOUNTER — Ambulatory Visit (INDEPENDENT_AMBULATORY_CARE_PROVIDER_SITE_OTHER): Payer: Medicaid Other | Admitting: Acute Care

## 2022-05-11 DIAGNOSIS — Z87891 Personal history of nicotine dependence: Secondary | ICD-10-CM | POA: Diagnosis not present

## 2022-05-11 NOTE — Patient Instructions (Signed)
Thank you for participating in the [facility] Screening Program. It was our pleasure to meet you today. We will call you with the results of your scan within the next few days. Your scan will be assigned a Lung RADS category score by the physicians reading the scans.  This Lung RADS score determines follow up scanning.  See below for description of categories, and follow up screening recommendations. We will be in touch to schedule your follow up screening annually or based on recommendations of our providers. We will fax a copy of your scan results to your Primary Care Physician, or the physician who referred you to the program, to ensure they have the results. Please call the office if you have any questions or concerns regarding your scanning experience or results.  Our office number is [phone]. Please speak with Denise Phelps, RN. , or  Denise Buckner RN, They are  our Lung Cancer Screening RN.'s If They are unavailable when you call, Please leave a message on the voice mail. We will return your call at our earliest convenience.This voice mail is monitored several times a day.  Remember, if your scan is normal, we will scan you annually as long as you continue to meet the criteria for the program. (Age 55-77, Current smoker or smoker who has quit within the last 15 years). If you are a smoker, remember, quitting is the single most powerful action that you can take to decrease your risk of lung cancer and other pulmonary, breathing related problems. We know quitting is hard, and we are here to help.  Please let us know if there is anything we can do to help you meet your goal of quitting. If you are a former smoker, congratulations. We are proud of you! Remain smoke free! Remember you can refer friends or family members through the number above.  We will screen them to make sure they meet criteria for the program. Thank you for helping us take better care of you by  participating in Lung Screening.  You can receive free nicotine replacement therapy ( patches, gum or mints) by calling 1-800-QUIT NOW. Please call so we can get you on the path to becoming  a non-smoker. I know it is hard, but you can do this!  Lung RADS Categories:  Lung RADS 1: no nodules or definitely non-concerning nodules.  Recommendation is for a repeat annual scan in 12 months.  Lung RADS 2:  nodules that are non-concerning in appearance and behavior with a very low likelihood of becoming an active cancer. Recommendation is for a repeat annual scan in 12 months.  Lung RADS 3: nodules that are probably non-concerning , includes nodules with a low likelihood of becoming an active cancer.  Recommendation is for a 6-month repeat screening scan. Often noted after an upper respiratory illness. We will be in touch to make sure you have no questions, and to schedule your 6-month scan.  Lung RADS 4 A: nodules with concerning findings, recommendation is most often for a follow up scan in 3 months or additional testing based on our provider's assessment of the scan. We will be in touch to make sure you have no questions and to schedule the recommended 3 month follow up scan.  Lung RADS 4 B:  indicates findings that are concerning. We will be in touch with you to schedule additional diagnostic testing based on our provider's  assessment of the scan.  Other options for assistance in smoking cessation (   As covered by your insurance benefits)  Hypnosis for smoking cessation  [employer] [phone]  Acupuncture for smoking cessation  [facility] [phone]   

## 2022-05-11 NOTE — Progress Notes (Signed)
Virtual Visit via Telephone Note  I connected with Kara Kelley on 05/11/22 at 10:00 AM EDT by telephone and verified that I am speaking with the correct person using two identifiers.  Location: Patient: At home Provider: [address] [place], [place], [address]    I discussed the limitations, risks, security and privacy concerns of performing an evaluation and management service by telephone and the availability of in person appointments. I also discussed with the patient that there may be a patient responsible charge related to this service. The patient expressed understanding and agreed to proceed.   Shared Decision Making Visit Lung Cancer Screening Program [phone])   Eligibility: Age 66 y.o. Pack Years Smoking History Calculation 35 pack year smoking history (# packs/per year x # years smoked) Recent History of coughing up blood  no Unexplained weight loss? no ( >Than 15 pounds within the last 6 months ) Prior History Lung / other cancer no (Diagnosis within the last 5 years already requiring surveillance chest CT Scans). Smoking Status Former Smoker Former Smokers: Years since quit: 3 years  Quit Date: 2020  Visit Components: Discussion included one or more decision making aids. yes Discussion included risk/benefits of screening. yes Discussion included potential follow up diagnostic testing for abnormal scans. yes Discussion included meaning and risk of over diagnosis. yes Discussion included meaning and risk of False Positives. yes Discussion included meaning of total radiation exposure. yes  Counseling Included: Importance of adherence to annual lung cancer LDCT screening. yes Impact of comorbidities on ability to participate in the program. yes Ability and willingness to under diagnostic treatment. yes  Smoking Cessation Counseling: Current Smokers:  Discussed importance of smoking cessation. yes Information about tobacco cessation classes and  interventions provided to patient. yes Patient provided with "ticket" for LDCT Scan. yes Symptomatic Patient. no  Counseling NA Diagnosis Code: Tobacco Use Z72.0 Asymptomatic Patient yes  Counseling (Intermediate counseling: > three minutes counseling) [id number] Former Smokers:  Discussed the importance of maintaining cigarette abstinence. yes Diagnosis Code: Personal History of Nicotine Dependence. X91.478 Information about tobacco cessation classes and interventions provided to patient. Yes Patient provided with "ticket" for LDCT Scan. yes Written Order for Lung Cancer Screening with LDCT placed in Epic. Yes (CT Chest Lung Cancer Screening Low Dose W/O CM) [id number] Z12.2-Screening of respiratory organs Z87.891-Personal history of nicotine dependence  I spent 25 minutes of face to face time/virtual visit time  with  Kara Kelley discussing the risks and benefits of lung cancer screening. We took the time to pause the power point at intervals to allow for questions to be asked and answered to ensure understanding. We discussed that she had taken the single most powerful action possible to decrease her risk of developing lung cancer when she quit smoking. I counseled her to remain smoke free, and to contact me if she ever had the desire to smoke again so that I can provide resources and tools to help support the effort to remain smoke free. We discussed the time and location of the scan, and that either  Abigail Miyamoto RN, Karlton Lemon, RN or I  or I will call / send a letter with the results within  24-72 hours of receiving them. She has the office contact information in the event she needs to speak with me,  she verbalized understanding of all of the above and had no further questions upon leaving the office.     I explained to the patient that there has been a  high incidence of coronary artery disease noted on these exams. I explained that this is a non-gated exam therefore degree or severity cannot  be determined. This patient is on statin therapy. I have asked the patient to follow-up with their PCP regarding any incidental finding of coronary artery disease and management with diet or medication as they feel is clinically indicated. The patient verbalized understanding of the above and had no further questions.     Bevelyn Ngo, NP 05/11/2022

## 2022-05-13 ENCOUNTER — Encounter: Payer: Self-pay | Admitting: Family

## 2022-05-13 ENCOUNTER — Ambulatory Visit (INDEPENDENT_AMBULATORY_CARE_PROVIDER_SITE_OTHER): Payer: Medicaid Other | Admitting: Family

## 2022-05-13 ENCOUNTER — Encounter: Payer: Self-pay | Admitting: Nurse Practitioner

## 2022-05-13 ENCOUNTER — Other Ambulatory Visit (HOSPITAL_COMMUNITY)
Admission: RE | Admit: 2022-05-13 | Discharge: 2022-05-13 | Disposition: A | Discharge status: Home / Self Care | Payer: Medicaid Other | Source: Ambulatory Visit | Attending: Family | Admitting: Family

## 2022-05-13 VITALS — BP 130/79 | HR 87 | Temp 98.3°F | Resp 16 | Ht 59.02 in | Wt 175.0 lb

## 2022-05-13 DIAGNOSIS — K219 Gastro-esophageal reflux disease without esophagitis: Secondary | ICD-10-CM

## 2022-05-13 DIAGNOSIS — R110 Nausea: Secondary | ICD-10-CM

## 2022-05-13 DIAGNOSIS — R109 Unspecified abdominal pain: Secondary | ICD-10-CM

## 2022-05-13 DIAGNOSIS — Z1211 Encounter for screening for malignant neoplasm of colon: Secondary | ICD-10-CM

## 2022-05-13 DIAGNOSIS — G8929 Other chronic pain: Secondary | ICD-10-CM | POA: Insufficient documentation

## 2022-05-13 LAB — POCT URINALYSIS DIP (CLINITEK)
Bilirubin, UA: NEGATIVE
Glucose, UA: NEGATIVE mg/dL
Ketones, POC UA: NEGATIVE mg/dL
Nitrite, UA: NEGATIVE
POC PROTEIN,UA: NEGATIVE
Spec Grav, UA: <=1.005 — AB (ref 1.010–1.025)
Urobilinogen, UA: 0.2 E.U./dL
pH, UA: 5.5 (ref 5.0–8.0)

## 2022-05-13 MED ORDER — SUCRALFATE 1 GM/10ML PO SUSP: 1.0000 g | Freq: Three times a day (TID) | ORAL | 1 refills | Status: DC

## 2022-05-13 MED ORDER — OMEPRAZOLE 40 MG PO CPDR: DELAYED_RELEASE_CAPSULE | ORAL | 1 refills | Status: DC

## 2022-05-13 NOTE — Progress Notes (Signed)
Pt presents for abdominal pain that has been going on for awhile, states she was not aware of [facility] contacting her in regards to matter

## 2022-05-14 ENCOUNTER — Other Ambulatory Visit: Payer: Self-pay | Admitting: Family

## 2022-05-14 DIAGNOSIS — D509 Iron deficiency anemia, unspecified: Secondary | ICD-10-CM

## 2022-05-14 LAB — CMP14+EGFR
ALT: 16 IU/L (ref 0–32)
AST: 19 IU/L (ref 0–40)
Albumin/Globulin Ratio: 1.8 (ref 1.2–2.2)
Albumin: 4.2 g/dL (ref 3.9–4.9)
Alkaline Phosphatase: 163 IU/L — ABNORMAL HIGH (ref 44–121)
BUN/Creatinine Ratio: 11 — ABNORMAL LOW (ref 12–28)
BUN: 10 mg/dL (ref 8–27)
Bilirubin Total: 0.3 mg/dL (ref 0.0–1.2)
CO2: 20 mmol/L (ref 20–29)
Calcium: 9.9 mg/dL (ref 8.7–10.3)
Chloride: 104 mmol/L (ref 96–106)
Creatinine, Ser: 0.89 mg/dL (ref 0.57–1.00)
Globulin, Total: 2.4 g/dL (ref 1.5–4.5)
Glucose: 115 mg/dL — ABNORMAL HIGH (ref 70–99)
Potassium: 4.6 mmol/L (ref 3.5–5.2)
Sodium: 138 mmol/L (ref 134–144)
Total Protein: 6.6 g/dL (ref 6.0–8.5)
eGFR: 72 mL/min/{1.73_m2} (ref >59)

## 2022-05-14 LAB — CBC
Hematocrit: 31.8 % — ABNORMAL LOW (ref 34.0–46.6)
Hemoglobin: 10.5 g/dL — ABNORMAL LOW (ref 11.1–15.9)
MCH: 24 pg — ABNORMAL LOW (ref 26.6–33.0)
MCHC: 33 g/dL (ref 31.5–35.7)
MCV: 73 fL — ABNORMAL LOW (ref 79–97)
Platelets: 206 10*3/uL (ref 150–450)
RBC: 4.38 x10E6/uL (ref 3.77–5.28)
RDW: 15.2 % (ref 11.7–15.4)
WBC: 6.4 10*3/uL (ref 3.4–10.8)

## 2022-05-14 LAB — LIPASE: Lipase: 31 U/L (ref 14–72)

## 2022-05-14 LAB — AMYLASE: Amylase: 76 U/L (ref 31–110)

## 2022-05-14 MED ORDER — IRON (FERROUS SULFATE) 325 (65 FE) MG PO TABS: 325.0000 mg | ORAL_TABLET | Freq: Every day | ORAL | 2 refills | Status: DC

## 2022-05-16 ENCOUNTER — Other Ambulatory Visit: Payer: Self-pay | Admitting: Family

## 2022-05-16 ENCOUNTER — Telehealth: Payer: Self-pay

## 2022-05-16 DIAGNOSIS — N760 Acute vaginitis: Secondary | ICD-10-CM | POA: Insufficient documentation

## 2022-05-16 DIAGNOSIS — B9689 Other specified bacterial agents as the cause of diseases classified elsewhere: Secondary | ICD-10-CM

## 2022-05-16 HISTORY — DX: Other specified bacterial agents as the cause of diseases classified elsewhere: B96.89

## 2022-05-16 LAB — CERVICOVAGINAL ANCILLARY ONLY
Bacterial Vaginitis (gardnerella): POSITIVE — AB
Candida Glabrata: NEGATIVE
Candida Vaginitis: NEGATIVE
Chlamydia: NEGATIVE
Comment: NEGATIVE
Comment: NEGATIVE
Comment: NEGATIVE
Comment: NEGATIVE
Comment: NEGATIVE
Comment: NORMAL
Neisseria Gonorrhea: NEGATIVE
Trichomonas: NEGATIVE

## 2022-05-16 MED ORDER — METRONIDAZOLE 500 MG PO TABS: 500.0000 mg | ORAL_TABLET | Freq: Two times a day (BID) | ORAL | 0 refills | Status: AC

## 2022-05-16 NOTE — Telephone Encounter (Signed)
Pt given lab results per notes of Ricky Stabs, NP on 05/14/22. Pt verbalized understanding and would like Rx sent to [facility].  [facility] [phone] - [place], [place] - [address] AT [facility] [address] Phone:  [phone]  Fax:  [phone]       Rema Fendt, NP  05/14/2022  8:40 AM EDT     - Kidney function normal. - Liver function normal.  - Amylase and Lipase normal. - Mild anemia. Ferrous sulfate prescribed

## 2022-05-16 NOTE — Telephone Encounter (Signed)
Iron supplement sent into requested pharmacy on 05/14/22

## 2022-05-17 ENCOUNTER — Other Ambulatory Visit: Payer: Self-pay | Admitting: Family

## 2022-05-17 DIAGNOSIS — Z1231 Encounter for screening mammogram for malignant neoplasm of breast: Secondary | ICD-10-CM

## 2022-05-18 ENCOUNTER — Ambulatory Visit
Admission: RE | Admit: 2022-05-18 | Discharge: 2022-05-18 | Disposition: A | Discharge status: Home / Self Care | Payer: Medicaid Other | Source: Ambulatory Visit | Attending: Family | Admitting: Family

## 2022-05-18 DIAGNOSIS — Z122 Encounter for screening for malignant neoplasm of respiratory organs: Secondary | ICD-10-CM

## 2022-05-18 DIAGNOSIS — Z87891 Personal history of nicotine dependence: Secondary | ICD-10-CM

## 2022-05-20 ENCOUNTER — Other Ambulatory Visit: Payer: Self-pay

## 2022-05-20 DIAGNOSIS — Z87891 Personal history of nicotine dependence: Secondary | ICD-10-CM

## 2022-05-20 DIAGNOSIS — Z122 Encounter for screening for malignant neoplasm of respiratory organs: Secondary | ICD-10-CM

## 2022-05-20 NOTE — Progress Notes (Signed)
T

## 2022-05-25 ENCOUNTER — Encounter: Payer: Self-pay | Admitting: Podiatry

## 2022-05-25 ENCOUNTER — Other Ambulatory Visit: Payer: Self-pay | Admitting: Podiatry

## 2022-05-25 DIAGNOSIS — M2042 Other hammer toe(s) (acquired), left foot: Secondary | ICD-10-CM

## 2022-05-25 DIAGNOSIS — M2012 Hallux valgus (acquired), left foot: Secondary | ICD-10-CM

## 2022-05-25 MED ORDER — CEPHALEXIN 500 MG PO CAPS: 500.0000 mg | ORAL_CAPSULE | Freq: Three times a day (TID) | ORAL | 0 refills | Status: DC

## 2022-05-25 MED ORDER — OXYCODONE-ACETAMINOPHEN 5-325 MG PO TABS: 1.0000 | ORAL_TABLET | ORAL | 0 refills | Status: DC | PRN

## 2022-05-25 MED ORDER — PROMETHAZINE HCL 25 MG PO TABS: 25.0000 mg | ORAL_TABLET | Freq: Three times a day (TID) | ORAL | 0 refills | Status: DC | PRN

## 2022-05-25 NOTE — Progress Notes (Signed)
Postop medications sent 

## 2022-05-27 ENCOUNTER — Other Ambulatory Visit: Payer: Self-pay

## 2022-05-27 DIAGNOSIS — E1122 Type 2 diabetes mellitus with diabetic chronic kidney disease: Secondary | ICD-10-CM

## 2022-05-27 MED ORDER — OZEMPIC (1 MG/DOSE) 4 MG/3ML ~~LOC~~ SOPN: 1.0000 mg | PEN_INJECTOR | SUBCUTANEOUS | 0 refills | Status: DC

## 2022-05-30 ENCOUNTER — Telehealth: Payer: Self-pay | Admitting: Family

## 2022-05-30 ENCOUNTER — Ambulatory Visit (INDEPENDENT_AMBULATORY_CARE_PROVIDER_SITE_OTHER): Payer: Medicaid Other | Admitting: *Deleted

## 2022-05-30 ENCOUNTER — Encounter: Payer: Medicaid Other | Admitting: Podiatry

## 2022-05-30 ENCOUNTER — Ambulatory Visit (INDEPENDENT_AMBULATORY_CARE_PROVIDER_SITE_OTHER): Payer: Medicaid Other

## 2022-05-30 VITALS — BP 170/76 | HR 107 | Temp 99.1°F

## 2022-05-30 DIAGNOSIS — M2042 Other hammer toe(s) (acquired), left foot: Secondary | ICD-10-CM

## 2022-05-30 DIAGNOSIS — Z9889 Other specified postprocedural states: Secondary | ICD-10-CM

## 2022-05-30 DIAGNOSIS — M2012 Hallux valgus (acquired), left foot: Secondary | ICD-10-CM | POA: Diagnosis not present

## 2022-05-30 NOTE — Telephone Encounter (Signed)
Clinical cytogeneticist confirmed Semaglutide, 1 MG/DOSE, (OZEMPIC, 1 MG/DOSE,) 4 MG/3ML SOPN was out of stock but will be in stock after 5 pm today and will have it ready for patient to pick up. Patient aware and informed.   [facility] [phone] - [place], [place] - [address] AT [address] Phone:  [phone]  Fax:  [phone]

## 2022-05-30 NOTE — Progress Notes (Signed)
Patient presents today for post op visit #1, patient of Dr. Ardelle Anton.  POV #1 DOS 05/25/2022 LT FOOT SURGICAL CORRECTION OF BUNION, (AUSTIN,AIKEN), HAMMERTOE REPAIR 2ND TOE  She presents in her walking boot ambulating with a cane. She states that the area of the big toe joint has been really sore the last few days. Denies any falls or injury to the foot. Foot is swollen. No signs of infection. No calf pain or shortness of breath. She is taking her pain medication and antibiotic regularly, as instructed.  BP: 170/76 P: 107 T: 99.1  Xrays taken today and reviewed by Dr. Ardelle Anton. He did take a look at her foot today as well.  Foot redressed today and placed her back in the boot. Reviewed icing and elevation. She will follow up with Dr. Ardelle Anton next week for POV#2.

## 2022-06-01 ENCOUNTER — Telehealth: Payer: Self-pay | Admitting: *Deleted

## 2022-06-01 NOTE — Telephone Encounter (Signed)
Patient is calling because her foot is hurting, leg is sore above the boot.  Spoke with patient and instructed her to keep foot elevated,place the icepk behind the knee, loosen ace wrap without disturbing the rest of the wrappings , keep boot on , take pain medicines 1 tablet q 4hours prn. Verbalized understanding.

## 2022-06-02 NOTE — Telephone Encounter (Signed)
Spoke with patient and she is not experiencing any leg pain/swelling/cramping,any signs of a blood clot. I recommended to patient that if this occurs to go to ER, verbalized understanding.

## 2022-06-02 NOTE — Telephone Encounter (Signed)
Patient is calling again , concerned that her foot is causing the entire ankle to be sore.  Returned call back to her,said that it feels better now.I instructed her to continue to ice, elevate the foot, take pain medicines as needed, loosen the ace wrap around the foot and leg,placed boot back on.  She verbalized understanding, will contact us if anything changes.

## 2022-06-06 ENCOUNTER — Ambulatory Visit (INDEPENDENT_AMBULATORY_CARE_PROVIDER_SITE_OTHER): Payer: Medicaid Other | Admitting: Pulmonary Disease

## 2022-06-06 ENCOUNTER — Encounter: Payer: Self-pay | Admitting: Pulmonary Disease

## 2022-06-06 VITALS — BP 134/70 | HR 92 | Temp 98.1°F | Ht 59.0 in | Wt 187.0 lb

## 2022-06-06 DIAGNOSIS — R0602 Shortness of breath: Secondary | ICD-10-CM

## 2022-06-06 DIAGNOSIS — J4540 Moderate persistent asthma, uncomplicated: Secondary | ICD-10-CM | POA: Diagnosis not present

## 2022-06-06 LAB — PULMONARY FUNCTION TEST
DL/VA % pred: 108 %
DL/VA: 4.71 ml/min/mmHg/L
DLCO cor % pred: 89 %
DLCO cor: 15.01 ml/min/mmHg
DLCO unc % pred: 80 %
DLCO unc: 13.48 ml/min/mmHg
FEF 25-75 Post: 1.18 L/sec
FEF 25-75 Pre: 1.48 L/sec
FEF2575-%Change-Post: -20 %
FEF2575-%Pred-Post: 62 %
FEF2575-%Pred-Pre: 77 %
FEV1-%Change-Post: -5 %
FEV1-%Pred-Post: 63 %
FEV1-%Pred-Pre: 67 %
FEV1-Post: 1.27 L
FEV1-Pre: 1.34 L
FEV1FVC-%Change-Post: -4 %
FEV1FVC-%Pred-Pre: 110 %
FEV6-%Change-Post: -2 %
FEV6-%Pred-Post: 60 %
FEV6-%Pred-Pre: 61 %
FEV6-Post: 1.51 L
FEV6-Pre: 1.54 L
FEV6FVC-%Pred-Post: 104 %
FEV6FVC-%Pred-Pre: 104 %
FVC-%Change-Post: -1 %
FVC-%Pred-Post: 59 %
FVC-%Pred-Pre: 60 %
FVC-Post: 1.55 L
FVC-Pre: 1.57 L
Post FEV1/FVC ratio: 82 %
Post FEV6/FVC ratio: 100 %
Pre FEV1/FVC ratio: 86 %
Pre FEV6/FVC Ratio: 100 %
RV % pred: 101 %
RV: 1.84 L
TLC % pred: 90 %
TLC: 3.88 L

## 2022-06-06 MED ORDER — FLUTICASONE-SALMETEROL 250-50 MCG/ACT IN AEPB: 1.0000 | INHALATION_SPRAY | Freq: Two times a day (BID) | RESPIRATORY_TRACT | 6 refills | Status: DC

## 2022-06-06 NOTE — Patient Instructions (Signed)
Start advair diskus inhaler 1 puff twice daily - rinse mouth out after each use  Continue to use albuterol inhaler as needed  Your breathing tests show a non-specific pulmonary function pattern which can be seen in COPD or asthma  Follow up in 3 months

## 2022-06-06 NOTE — Progress Notes (Unsigned)
Synopsis: Referred in May 2023 for asthma by Durene Fruits, NP  Subjective:   PATIENT ID: Kara Kelley GENDER: female DOB: 19-Sep-1956, MRN: [id number]  HPI  Chief Complaint  Patient presents with   Follow-up    PFT done today. Breathing is unchanged. She is coughing less-non prod.    Kara Kelley is a 66 year old woman, former smoker with history of DMII and GERD who returns to pulmonary clinic for asthma.   She reports she is coughing less since last visit with starting fluticasone nasal spray and elevating the head of her bed for GERD management.   PFTs today show non-specific pulmonary function pattern.   Initial OV 02/23/22 She reports having shortness of breath, intermittent cough and wheezing for many years.  She is coughing up small amounts of clear phlegm.  She has increased coughing episodes at nighttime.  She continues to experience intermittent wheezing.  Triggers that bring on her symptoms include cigarette smoke exposure, hot humid days, strong perfumes/colognes or strong cleaning agents.  Spring allergies can also bother her breathing and lead to nasal congestion and drainage.  She is not currently taking any allergy medicines.  She can experience some nighttime awakenings due to cough but does not experience wheezing at that time.  She is currently taking lisinopril.  She has history of GERD and is currently taking omeprazole daily which controls her reflux well.  She uses as needed albuterol with little relief in her symptoms.  She is not currently on any maintenance inhaler.  She is a former smoker and quit 3 years ago.  She has a greater than 20-pack-year smoking history as she smoked from age [age], half a pack per day.  He reports being treated for tuberculosis in 1985 when she was living in [place].  Past Medical History:  Diagnosis Date   Anemia    Anxiety    Arthritis    Asthma    Bunion    Callus    Chronic pain    Cocaine abuse (HCC)    COPD  (chronic obstructive pulmonary disease) (HCC)    Corns and callosities    Degenerative joint disease    Depression    Diabetes mellitus    Endometrial polyp    ETOH abuse    Gall stones    GERD (gastroesophageal reflux disease)    Headache    history of Migraines   Hepatitis C    Hep C   Hyperlipidemia    Hypertension    Insomnia    Spondylolisthesis of lumbar region    Substance abuse ([place])    alcoholism   Tuberculosis 1985   Wears dentures    Wears glasses      Family History  Problem Relation Age of Onset   Heart disease Mother    Diabetes Other        mat great aunt   Cirrhosis Other        mat great aunt   Cirrhosis Other        mat great uncles x 2   Colon cancer Neg Hx    Breast cancer Neg Hx      Social History   Socioeconomic History   Marital status: Single    Spouse name: Not on file   Number of children: 3   Years of education: Not on file   Highest education level: Not on file  Occupational History   Occupation: disablily  Tobacco Use   Smoking status:  Former    Packs/day: 0.75    Years: 47.00    Total pack years: 35.25    Types: Cigarettes    Quit date: 2020    Years since quitting: 3.6    Passive exposure: Past   Smokeless tobacco: Never  Vaping Use   Vaping Use: Never used  Substance and Sexual Activity   Alcohol use: No    Alcohol/week: 0.0 standard drinks of alcohol    Comment: in rehab   Drug use: No    Comment: in rehab   Sexual activity: Not Currently    Birth control/protection: Post-menopausal  Other Topics Concern   Not on file  Social History Narrative   Not on file   Social Determinants of Health   Financial Resource Strain: Medium Risk (06/23/2020)   Overall Financial Resource Strain (CARDIA)    Difficulty of Paying Living Expenses: Somewhat hard  Food Insecurity: No Food Insecurity (03/27/2020)   Hunger Vital Sign    Worried About Running Out of Food in the Last Year: Never true    Ran Out of Food in the Last  Year: Never true  Transportation Needs: No Transportation Needs (03/27/2020)   PRAPARE - Hydrologist (Medical): No    Lack of Transportation (Non-Medical): No  Physical Activity: Not on file  Stress: Not on file  Social Connections: Not on file  Intimate Partner Violence: Not on file     No Known Allergies   Outpatient Medications Prior to Visit  Medication Sig Dispense Refill   Accu-Chek FastClix Lancets MISC Use as directed to check blood sugars 2 times per day dx: e11.65 102 each prn   Accu-Chek Softclix Lancets lancets Use as instructed to check blood sugars twice daily E11.69 100 each 2   albuterol (VENTOLIN HFA) 108 (90 Base) MCG/ACT inhaler Inhale 2 puffs into the lungs every 6 (six) hours as needed for wheezing or shortness of breath. 18 g 2   ARIPiprazole (ABILIFY) 10 MG tablet Take 10 mg by mouth daily.      atorvastatin (LIPITOR) 10 MG tablet Take 1 tablet (10 mg total) by mouth daily. 90 tablet 1   Blood Glucose Monitoring Suppl (ACCU-CHEK GUIDE ME) w/Device KIT Use to check blood sugars twice daily E11.69 1 kit 1   Blood Glucose Monitoring Suppl (ACCU-CHEK NANO SMARTVIEW) w/Device KIT Use as directed to check blood sugars 2 times per day dx: e11.65 1 kit 0   buPROPion (WELLBUTRIN XL) 300 MG 24 hr tablet Take 1 tablet (300 mg total) by mouth daily. 90 tablet 0   cyclobenzaprine (FLEXERIL) 5 MG tablet Take 1 tablet (5 mg total) by mouth 3 (three) times daily as needed for muscle spasms. 90 tablet 0   fluticasone (FLONASE) 50 MCG/ACT nasal spray Place 1 spray into both nostrils daily. 16 g 2   Glucagon (GVOKE HYPOPEN 2-PACK) 1 MG/0.2ML SOAJ Inject 1 each into the skin as needed. If blood sugar less than 60 0.4 mL 3   glucose blood (ACCU-CHEK GUIDE) test strip Use as directed to check blood sugars 2 times per day dx: e11.69 300 strip 2   hydrOXYzine (ATARAX) 25 MG tablet Take 1 tablet (25 mg total) by mouth every 8 (eight) hours as needed for anxiety.  30 tablet 1   insulin detemir (LEVEMIR FLEXTOUCH) 100 UNIT/ML FlexPen Inject 32 Units into the skin at bedtime. 30 mL 0   Insulin Pen Needle (BD PEN NEEDLE NANO 2ND GEN) 32G X 4 MM  MISC Inject 1 each into the skin daily. as directed 200 each 0   Iron, Ferrous Sulfate, 325 (65 Fe) MG TABS Take 325 mg by mouth daily. 30 tablet 2   LINZESS 145 MCG CAPS capsule Take 145 mcg by mouth daily before breakfast.      lisinopril (ZESTRIL) 10 MG tablet TAKE 1 TABLET(10 MG) BY MOUTH DAILY 90 tablet 0   metFORMIN (GLUCOPHAGE-XR) 500 MG 24 hr tablet TAKE 1 TABLET(500 MG) BY MOUTH DAILY WITH BREAKFAST 90 tablet 1   methimazole (TAPAZOLE) 5 MG tablet Take 1 tablet (5 mg total) by mouth daily. 90 tablet 1   Multiple Vitamins-Minerals (MULTIVITAMIN WITH MINERALS) tablet Take 1 tablet by mouth daily. 50 +     NOVOLOG FLEXPEN 100 UNIT/ML FlexPen Inject 18 Units into the skin 3 (three) times daily with meals. 15 mL 3   omeprazole (PRILOSEC) 40 MG capsule TAKE 1 CAPSULE(40 MG) BY MOUTH DAILY BEFORE BREAKFAST 90 capsule 1   oxyCODONE-acetaminophen (PERCOCET) 5-325 MG tablet Take 1 tablet by mouth every 4 (four) hours as needed. 30 tablet 0   promethazine (PHENERGAN) 25 MG tablet Take 1 tablet (25 mg total) by mouth every 8 (eight) hours as needed for nausea or vomiting. 15 tablet 0   Semaglutide, 1 MG/DOSE, (OZEMPIC, 1 MG/DOSE,) 4 MG/3ML SOPN Inject 1 mg into the skin once a week. 9 mL 0   spironolactone (ALDACTONE) 50 MG tablet TAKE 1 TABLET(50 MG) BY MOUTH DAILY 90 tablet 0   traZODone (DESYREL) 150 MG tablet Take 150 mg by mouth at bedtime.      cephALEXin (KEFLEX) 500 MG capsule Take 1 capsule (500 mg total) by mouth 3 (three) times daily. 21 capsule 0   sucralfate (CARAFATE) 1 GM/10ML suspension Take 10 mLs (1 g total) by mouth 3 (three) times daily with meals. 420 mL 1   No facility-administered medications prior to visit.   Review of Systems  Constitutional:  Negative for chills, fever, malaise/fatigue and  weight loss.  HENT:  Negative for congestion, sinus pain and sore throat.   Eyes: Negative.   Respiratory:  Positive for cough, shortness of breath and wheezing. Negative for hemoptysis and sputum production.   Cardiovascular:  Negative for chest pain, palpitations, orthopnea, claudication and leg swelling.  Gastrointestinal:  Positive for heartburn. Negative for abdominal pain, nausea and vomiting.  Genitourinary: Negative.   Musculoskeletal:  Negative for joint pain and myalgias.  Skin:  Negative for rash.  Neurological:  Negative for weakness.  Endo/Heme/Allergies:  Positive for environmental allergies.  Psychiatric/Behavioral: Negative.      Objective:   Vitals:   06/06/22 1210  BP: 134/70  Pulse: 92  Temp: 98.1 F (36.7 C)  TempSrc: Oral  SpO2: 96%  Weight: 187 lb (84.8 kg)  Height: '4\' 11"'  (1.499 m)   Physical Exam Constitutional:      General: She is not in acute distress.    Appearance: She is not ill-appearing.  HENT:     Head: Normocephalic and atraumatic.  Cardiovascular:     Rate and Rhythm: Normal rate and regular rhythm.     Pulses: Normal pulses.     Heart sounds: Normal heart sounds. No murmur heard. Pulmonary:     Effort: Pulmonary effort is normal.     Breath sounds: Normal breath sounds. No wheezing, rhonchi or rales.  Musculoskeletal:     Right lower leg: No edema.     Left lower leg: No edema.  Skin:    General: Skin  is warm and dry.  Neurological:     General: No focal deficit present.     Mental Status: She is alert.  Psychiatric:        Mood and Affect: Mood normal.        Behavior: Behavior normal.        Thought Content: Thought content normal.        Judgment: Judgment normal.    CBC    Component Value Date/Time   WBC 6.4 05/13/2022 1030   WBC 6.0 10/19/2021 1142   RBC 4.38 05/13/2022 1030   RBC 4.08 10/19/2021 1142   HGB 10.5 (L) 05/13/2022 1030   HCT 31.8 (L) 05/13/2022 1030   PLT 206 05/13/2022 1030   MCV 73 (L) 05/13/2022  1030   MCH 24.0 (L) 05/13/2022 1030   MCH 24.5 (L) 10/19/2021 1142   MCHC 33.0 05/13/2022 1030   MCHC 31.8 10/19/2021 1142   RDW 15.2 05/13/2022 1030   LYMPHSABS 2.1 07/20/2021 2038   MONOABS 0.3 07/20/2021 2038   EOSABS 0.1 07/20/2021 2038   BASOSABS 0.0 07/20/2021 2038      Latest Ref Rng & Units 05/13/2022   10:30 AM 10/19/2021   11:42 AM 07/20/2021    8:38 PM  BMP  Glucose 70 - 99 mg/dL 115  149  109   BUN 8 - 27 mg/dL '10  11  8   ' Creatinine 0.57 - 1.00 mg/dL 0.89  1.01  1.18   BUN/Creat Ratio 12 - 28 11     Sodium 134 - 144 mmol/L 138  138  136   Potassium 3.5 - 5.2 mmol/L 4.6  4.4  4.2   Chloride 96 - 106 mmol/L 104  105  106   CO2 20 - 29 mmol/L '20  21  20   ' Calcium 8.7 - 10.3 mg/dL 9.9  10.1  9.0    Chest imaging: CXR 09/20/2021 Trachea is midline.   Cardiomediastinal contours and hilar structures are stable. Lung volumes are diminished. LEFT lower lobe airspace disease is noted. No gross interstitial thickening. No sign of pleural effusion. Added density over the spine on the lateral view.  PFT:    Latest Ref Rng & Units 06/06/2022   10:39 AM  PFT Results  FVC-Pre L 1.57   FVC-Predicted Pre % 60   FVC-Post L 1.55   FVC-Predicted Post % 59   Pre FEV1/FVC % % 86   Post FEV1/FCV % % 82   FEV1-Pre L 1.34   FEV1-Predicted Pre % 67   FEV1-Post L 1.27   DLCO uncorrected ml/min/mmHg 13.48   DLCO UNC% % 80   DLCO corrected ml/min/mmHg 15.01   DLCO COR %Predicted % 89   DLVA Predicted % 108   TLC L 3.88   TLC % Predicted % 90   RV % Predicted % 101   2023: Non-specific pulmonary function  Labs:  Path:  Echo:  Heart Catheterization:  Assessment & Plan:   Moderate persistent asthma without complication - Plan: fluticasone-salmeterol (ADVAIR DISKUS) 250-50 MCG/ACT AEPB  Discussion: Kara Kelley is a 66 year old woman, former smoker with history of DMII and GERD who returns to pulmonary clinic for asthma.   She is to start advair 250-74mg 1 puff  twice daily. We will consider montelukast at next visit if she continues to have uncontrolled asthma symptoms. She can use albuterol inhaler as needed.  If she continues to have cough, we will have her transitioned of Lisinopril.  She is to  continue taking omeprazole daily and elevate the head of her bed to reduce nocturnal reflux.  We also discussed eating at least 2 hours prior to bedtime.  She is to continue fluticasone nasal spray daily for her postnasal drainage.  She has been referred to our lung cancer screening program.  Follow-up in 3 months.  Freda Jackson, MD [facility] Office: [phone]   Current Outpatient Medications:    Accu-Chek FastClix Lancets MISC, Use as directed to check blood sugars 2 times per day dx: e11.65, Disp: 102 each, Rfl: prn   Accu-Chek Softclix Lancets lancets, Use as instructed to check blood sugars twice daily E11.69, Disp: 100 each, Rfl: 2   albuterol (VENTOLIN HFA) 108 (90 Base) MCG/ACT inhaler, Inhale 2 puffs into the lungs every 6 (six) hours as needed for wheezing or shortness of breath., Disp: 18 g, Rfl: 2   ARIPiprazole (ABILIFY) 10 MG tablet, Take 10 mg by mouth daily. , Disp: , Rfl:    atorvastatin (LIPITOR) 10 MG tablet, Take 1 tablet (10 mg total) by mouth daily., Disp: 90 tablet, Rfl: 1   Blood Glucose Monitoring Suppl (ACCU-CHEK GUIDE ME) w/Device KIT, Use to check blood sugars twice daily E11.69, Disp: 1 kit, Rfl: 1   Blood Glucose Monitoring Suppl (ACCU-CHEK NANO SMARTVIEW) w/Device KIT, Use as directed to check blood sugars 2 times per day dx: e11.65, Disp: 1 kit, Rfl: 0   buPROPion (WELLBUTRIN XL) 300 MG 24 hr tablet, Take 1 tablet (300 mg total) by mouth daily., Disp: 90 tablet, Rfl: 0   cyclobenzaprine (FLEXERIL) 5 MG tablet, Take 1 tablet (5 mg total) by mouth 3 (three) times daily as needed for muscle spasms., Disp: 90 tablet, Rfl: 0   fluticasone (FLONASE) 50 MCG/ACT nasal spray, Place 1 spray into both  nostrils daily., Disp: 16 g, Rfl: 2   fluticasone-salmeterol (ADVAIR DISKUS) 250-50 MCG/ACT AEPB, Inhale 1 puff into the lungs in the morning and at bedtime., Disp: 60 each, Rfl: 6   Glucagon (GVOKE HYPOPEN 2-PACK) 1 MG/0.2ML SOAJ, Inject 1 each into the skin as needed. If blood sugar less than 60, Disp: 0.4 mL, Rfl: 3   glucose blood (ACCU-CHEK GUIDE) test strip, Use as directed to check blood sugars 2 times per day dx: e11.69, Disp: 300 strip, Rfl: 2   hydrOXYzine (ATARAX) 25 MG tablet, Take 1 tablet (25 mg total) by mouth every 8 (eight) hours as needed for anxiety., Disp: 30 tablet, Rfl: 1   insulin detemir (LEVEMIR FLEXTOUCH) 100 UNIT/ML FlexPen, Inject 32 Units into the skin at bedtime., Disp: 30 mL, Rfl: 0   Insulin Pen Needle (BD PEN NEEDLE NANO 2ND GEN) 32G X 4 MM MISC, Inject 1 each into the skin daily. as directed, Disp: 200 each, Rfl: 0   Iron, Ferrous Sulfate, 325 (65 Fe) MG TABS, Take 325 mg by mouth daily., Disp: 30 tablet, Rfl: 2   LINZESS 145 MCG CAPS capsule, Take 145 mcg by mouth daily before breakfast. , Disp: , Rfl:    lisinopril (ZESTRIL) 10 MG tablet, TAKE 1 TABLET(10 MG) BY MOUTH DAILY, Disp: 90 tablet, Rfl: 0   metFORMIN (GLUCOPHAGE-XR) 500 MG 24 hr tablet, TAKE 1 TABLET(500 MG) BY MOUTH DAILY WITH BREAKFAST, Disp: 90 tablet, Rfl: 1   methimazole (TAPAZOLE) 5 MG tablet, Take 1 tablet (5 mg total) by mouth daily., Disp: 90 tablet, Rfl: 1   Multiple Vitamins-Minerals (MULTIVITAMIN WITH MINERALS) tablet, Take 1 tablet by mouth daily. 50 +, Disp: , Rfl:  NOVOLOG FLEXPEN 100 UNIT/ML FlexPen, Inject 18 Units into the skin 3 (three) times daily with meals., Disp: 15 mL, Rfl: 3   omeprazole (PRILOSEC) 40 MG capsule, TAKE 1 CAPSULE(40 MG) BY MOUTH DAILY BEFORE BREAKFAST, Disp: 90 capsule, Rfl: 1   oxyCODONE-acetaminophen (PERCOCET) 5-325 MG tablet, Take 1 tablet by mouth every 4 (four) hours as needed., Disp: 30 tablet, Rfl: 0   promethazine (PHENERGAN) 25 MG tablet, Take 1 tablet  (25 mg total) by mouth every 8 (eight) hours as needed for nausea or vomiting., Disp: 15 tablet, Rfl: 0   Semaglutide, 1 MG/DOSE, (OZEMPIC, 1 MG/DOSE,) 4 MG/3ML SOPN, Inject 1 mg into the skin once a week., Disp: 9 mL, Rfl: 0   spironolactone (ALDACTONE) 50 MG tablet, TAKE 1 TABLET(50 MG) BY MOUTH DAILY, Disp: 90 tablet, Rfl: 0   traZODone (DESYREL) 150 MG tablet, Take 150 mg by mouth at bedtime. , Disp: , Rfl:    ciprofloxacin (CIPRO) 500 MG tablet, Take 1 tablet (500 mg total) by mouth 2 (two) times daily for 3 days., Disp: 6 tablet, Rfl: 0   miconazole (MONISTAT 7 SIMPLY CURE) 2 % vaginal cream, Place 1 Applicatorful vaginally at bedtime., Disp: 45 g, Rfl: 0

## 2022-06-06 NOTE — Progress Notes (Signed)
Full PFT performed today. °

## 2022-06-06 NOTE — Patient Instructions (Signed)
Full PFT performed today. °

## 2022-06-07 ENCOUNTER — Encounter: Payer: Self-pay | Admitting: Pulmonary Disease

## 2022-06-07 ENCOUNTER — Telehealth: Payer: Medicaid Other | Admitting: Nurse Practitioner

## 2022-06-07 ENCOUNTER — Ambulatory Visit: Payer: Self-pay | Admitting: *Deleted

## 2022-06-07 DIAGNOSIS — N3000 Acute cystitis without hematuria: Secondary | ICD-10-CM | POA: Diagnosis not present

## 2022-06-07 DIAGNOSIS — T3695XA Adverse effect of unspecified systemic antibiotic, initial encounter: Secondary | ICD-10-CM | POA: Diagnosis not present

## 2022-06-07 DIAGNOSIS — B379 Candidiasis, unspecified: Secondary | ICD-10-CM | POA: Diagnosis not present

## 2022-06-07 MED ORDER — MICONAZOLE NITRATE 2 % VA CREA: 1.0000 | TOPICAL_CREAM | Freq: Every day | VAGINAL | 0 refills | Status: DC

## 2022-06-07 MED ORDER — CIPROFLOXACIN HCL 500 MG PO TABS: 500.0000 mg | ORAL_TABLET | Freq: Two times a day (BID) | ORAL | 0 refills | Status: AC

## 2022-06-07 NOTE — Progress Notes (Signed)
Virtual Visit Consent   [name], you are scheduled for a virtual visit with a [facility] provider today. Just as with appointments in the office, your consent must be obtained to participate. Your consent will be active for this visit and any virtual visit you may have with one of our providers in the next 365 days. If you have a MyChart account, a copy of this consent can be sent to you electronically.  As this is a virtual visit, video technology does not allow for your provider to perform a traditional examination. This may limit your provider's ability to fully assess your condition. If your provider identifies any concerns that need to be evaluated in person or the need to arrange testing (such as labs, EKG, etc.), we will make arrangements to do so. Although advances in technology are sophisticated, we cannot ensure that it will always work on either your end or our end. If the connection with a video visit is poor, the visit may have to be switched to a telephone visit. With either a video or telephone visit, we are not always able to ensure that we have a secure connection.  By engaging in this virtual visit, you consent to the provision of healthcare and authorize for your insurance to be billed (if applicable) for the services provided during this visit. Depending on your insurance coverage, you may receive a charge related to this service.  I need to obtain your verbal consent now. Are you willing to proceed with your visit today? Kara Kelley has provided verbal consent on 06/07/2022 for a virtual visit (video or telephone). Apolonio Schneiders, FNP  Date: 06/07/2022 4:28 PM  Virtual Visit via Video Note   I, Apolonio Schneiders, connected with  Kara Kelley  ([id number], 10-24-1955) on 06/07/22 at  4:30 PM EDT by a video-enabled telemedicine application and verified that I am speaking with the correct person using two identifiers.  Location: Patient: Virtual Visit Location Patient:  Home Provider: Virtual Visit Location Provider: Home Office   I discussed the limitations of evaluation and management by telemedicine and the availability of in person appointments. The patient expressed understanding and agreed to proceed.    History of Present Illness: Kara Kelley is a 66 y.o. who identifies as a female who was assigned female at birth, and is being seen today for burning with urination. This has been bothering for the past week.   She noted these symptoms started when she started using ferrous sulfate. She feels tenderness at her urethra    She did call her PCPs office and was triaged to [facility]  Denies a fever No new back pain  She has had some nausea without vomiting this has been chronic since her most recent surgery on her foot 2 weeks ago  She just finished Keflex that she was on post operatively and her symptoms started even while on that.   Urine is noted to be cloudy  Denies vaginal discharge   Problems:  Patient Active Problem List   Diagnosis Date Noted   Bacterial vaginosis 05/16/2022   Cholelithiasis without obstruction 04/11/2022   Chronic idiopathic constipation 04/11/2022   Gallbladder calculus with acute cholecystitis and no obstruction 04/11/2022   History of adenomatous polyp of colon 04/11/2022   Morbid obesity ([place]) 04/11/2022   Polysubstance abuse ([place]) 04/11/2022   Right lower quadrant pain 04/11/2022   Type 2 diabetes mellitus ([place]) 04/11/2022   Type II diabetes mellitus ([place]) 04/11/2022   Pharyngoesophageal  dysphagia 04/05/2022   Hyperthyroidism 12/29/2021   Cervical spondylosis with myelopathy and radiculopathy 10/26/2021   Bilateral impacted cerumen 09/16/2020   Itching of ear 09/16/2020   Heartburn 07/10/2020   Lumbar radiculopathy 04/03/2020   Hearing loss of right ear 03/12/2020   Plantar flexed metatarsal bone of left foot 08/14/2019   Skin burn 07/09/2019   Uncontrolled type 2 diabetes mellitus with hyperglycemia ([place])  06/19/2019   Hypertensive retinopathy of both eyes 03/06/2019   Mild nonproliferative diabetic retinopathy of both eyes ([place]) 03/06/2019   Essential hypertension 10/05/2018   Cramp and spasm 07/20/2018   Lactic acidosis    SIRS (systemic inflammatory response syndrome) ([place])    Acute renal failure ([place])    Type 2 diabetes mellitus with stage 3b chronic kidney disease, without long-term current use of insulin ([place])    Acute blood loss anemia    Sepsis ([place]) 07/17/2017   Hyponatremia 07/17/2017   Postoperative wound infection 07/17/2017   AKI (acute kidney injury) ([place]) 07/17/2017   Dehydration 07/17/2017   DM type 2 (diabetes mellitus, type 2) ([place]) 07/17/2017   Acute encephalopathy 07/17/2017   Degenerative spondylolisthesis 07/03/2017   Post-menopausal bleeding 06/20/2017   Chronic hepatitis C ([place]) 12/09/2015   Inclusion cyst 09/16/2014   Onychomycosis 08/15/2014   Type I (juvenile type) diabetes mellitus with renal manifestations, not stated as uncontrolled(250.41) 06/10/2014   Unspecified constipation 05/22/2014   Gastroesophageal reflux disease without esophagitis 05/22/2014   Pain in lower limb 01/29/2014   Eschar of multiple sites 07/31/2013   Porokeratosis 07/02/2013   Cyst of joint of ankle or foot 06/04/2013   History of endometrial ablation 02/06/2013   Pain in joint, ankle and foot 01/23/2013   Callus of foot 01/23/2013   Ingrown nail 01/23/2013   Deformity of metatarsal 01/23/2013    Allergies: No Known Allergies Medications:  Current Outpatient Medications:    Accu-Chek FastClix Lancets MISC, Use as directed to check blood sugars 2 times per day dx: e11.65, Disp: 102 each, Rfl: prn   Accu-Chek Softclix Lancets lancets, Use as instructed to check blood sugars twice daily E11.69, Disp: 100 each, Rfl: 2   albuterol (VENTOLIN HFA) 108 (90 Base) MCG/ACT inhaler, Inhale 2 puffs into the lungs every 6 (six) hours as needed for wheezing or shortness of breath., Disp: 18  g, Rfl: 2   ARIPiprazole (ABILIFY) 10 MG tablet, Take 10 mg by mouth daily. , Disp: , Rfl:    atorvastatin (LIPITOR) 10 MG tablet, Take 1 tablet (10 mg total) by mouth daily., Disp: 90 tablet, Rfl: 1   Blood Glucose Monitoring Suppl (ACCU-CHEK GUIDE ME) w/Device KIT, Use to check blood sugars twice daily E11.69, Disp: 1 kit, Rfl: 1   Blood Glucose Monitoring Suppl (ACCU-CHEK NANO SMARTVIEW) w/Device KIT, Use as directed to check blood sugars 2 times per day dx: e11.65, Disp: 1 kit, Rfl: 0   buPROPion (WELLBUTRIN XL) 300 MG 24 hr tablet, Take 1 tablet (300 mg total) by mouth daily., Disp: 90 tablet, Rfl: 0   cephALEXin (KEFLEX) 500 MG capsule, Take 1 capsule (500 mg total) by mouth 3 (three) times daily., Disp: 21 capsule, Rfl: 0   cyclobenzaprine (FLEXERIL) 5 MG tablet, Take 1 tablet (5 mg total) by mouth 3 (three) times daily as needed for muscle spasms., Disp: 90 tablet, Rfl: 0   fluticasone (FLONASE) 50 MCG/ACT nasal spray, Place 1 spray into both nostrils daily., Disp: 16 g, Rfl: 2   fluticasone-salmeterol (ADVAIR DISKUS) 250-50 MCG/ACT AEPB, Inhale 1 puff  into the lungs in the morning and at bedtime., Disp: 60 each, Rfl: 6   Glucagon (GVOKE HYPOPEN 2-PACK) 1 MG/0.2ML SOAJ, Inject 1 each into the skin as needed. If blood sugar less than 60, Disp: 0.4 mL, Rfl: 3   glucose blood (ACCU-CHEK GUIDE) test strip, Use as directed to check blood sugars 2 times per day dx: e11.69, Disp: 300 strip, Rfl: 2   hydrOXYzine (ATARAX) 25 MG tablet, Take 1 tablet (25 mg total) by mouth every 8 (eight) hours as needed for anxiety., Disp: 30 tablet, Rfl: 1   insulin detemir (LEVEMIR FLEXTOUCH) 100 UNIT/ML FlexPen, Inject 32 Units into the skin at bedtime., Disp: 30 mL, Rfl: 0   Insulin Pen Needle (BD PEN NEEDLE NANO 2ND GEN) 32G X 4 MM MISC, Inject 1 each into the skin daily. as directed, Disp: 200 each, Rfl: 0   Iron, Ferrous Sulfate, 325 (65 Fe) MG TABS, Take 325 mg by mouth daily., Disp: 30 tablet, Rfl: 2    LINZESS 145 MCG CAPS capsule, Take 145 mcg by mouth daily before breakfast. , Disp: , Rfl:    lisinopril (ZESTRIL) 10 MG tablet, TAKE 1 TABLET(10 MG) BY MOUTH DAILY, Disp: 90 tablet, Rfl: 0   metFORMIN (GLUCOPHAGE-XR) 500 MG 24 hr tablet, TAKE 1 TABLET(500 MG) BY MOUTH DAILY WITH BREAKFAST, Disp: 90 tablet, Rfl: 1   methimazole (TAPAZOLE) 5 MG tablet, Take 1 tablet (5 mg total) by mouth daily., Disp: 90 tablet, Rfl: 1   Multiple Vitamins-Minerals (MULTIVITAMIN WITH MINERALS) tablet, Take 1 tablet by mouth daily. 50 +, Disp: , Rfl:    NOVOLOG FLEXPEN 100 UNIT/ML FlexPen, Inject 18 Units into the skin 3 (three) times daily with meals., Disp: 15 mL, Rfl: 3   omeprazole (PRILOSEC) 40 MG capsule, TAKE 1 CAPSULE(40 MG) BY MOUTH DAILY BEFORE BREAKFAST, Disp: 90 capsule, Rfl: 1   oxyCODONE-acetaminophen (PERCOCET) 5-325 MG tablet, Take 1 tablet by mouth every 4 (four) hours as needed., Disp: 30 tablet, Rfl: 0   promethazine (PHENERGAN) 25 MG tablet, Take 1 tablet (25 mg total) by mouth every 8 (eight) hours as needed for nausea or vomiting., Disp: 15 tablet, Rfl: 0   Semaglutide, 1 MG/DOSE, (OZEMPIC, 1 MG/DOSE,) 4 MG/3ML SOPN, Inject 1 mg into the skin once a week., Disp: 9 mL, Rfl: 0   spironolactone (ALDACTONE) 50 MG tablet, TAKE 1 TABLET(50 MG) BY MOUTH DAILY, Disp: 90 tablet, Rfl: 0   sucralfate (CARAFATE) 1 GM/10ML suspension, Take 10 mLs (1 g total) by mouth 3 (three) times daily with meals., Disp: 420 mL, Rfl: 1   traZODone (DESYREL) 150 MG tablet, Take 150 mg by mouth at bedtime. , Disp: , Rfl:   Observations/Objective:  Telephone encounter unable to connect via video  Resting comfortably  at home.  Head is normocephalic, atraumatic.  No labored breathing.    Assessment and Plan: 1. Antibiotic-induced yeast infection  - miconazole (MONISTAT 7 SIMPLY CURE) 2 % vaginal cream; Place 1 Applicatorful vaginally at bedtime.  Dispense: 45 g; Refill: 0  2. Acute cystitis without hematuria  -  ciprofloxacin (CIPRO) 500 MG tablet; Take 1 tablet (500 mg total) by mouth 2 (two) times daily for 3 days.  Dispense: 6 tablet; Refill: 0    Drug choice due to multiple drug to drug interactions with other UTI options Follow Up Instructions: I discussed the assessment and treatment plan with the patient. The patient was provided an opportunity to ask questions and all were answered. The patient agreed with the  plan and demonstrated an understanding of the instructions.  A copy of instructions were sent to the patient via MyChart unless otherwise noted below.   The patient was advised to call back or seek an in-person evaluation if the symptoms worsen or if the condition fails to improve as anticipated.  Time:  I spent 10 minutes with the patient via telehealth technology discussing the above problems/concerns.    Apolonio Schneiders, FNP

## 2022-06-07 NOTE — Telephone Encounter (Signed)
Per pt."Pt called saying she is on ferrous sulfate and since then she is burning when she urinates and her urethra hurts.   CB@  [phone] "      Chief Complaint: Dysuria Symptoms: Frequency, urgency, burning with urination, urine cloudy.Pain lower abdomen.  Frequency: 1 week. Pertinent Negatives: Patient denies fever Disposition: [] ED /[] Urgent Care (no appt availability in office) / [] Appointment(In office/virtual)/ [x]  [facility]/ [] Home Care/ [] Refused Recommended Disposition /[] [facility]/ []  Follow-up with PCP Additional Notes: Care advise provided. [facility] Virtual secured for pt, assisted by ' with IT as online system down.  Reason for Disposition  Urinating more frequently than usual (i.e., frequency)  Answer Assessment - Initial Assessment Questions 1. SYMPTOM: "What's the main symptom you're concerned about?" (e.g., frequency, incontinence)     Burning with urination 2. ONSET: "When did the start?"     1 week ago 3. PAIN: "Is there any pain?" If Yes, ask: "How bad is it?" (Scale: 1-10; mild, moderate, severe)     Yes, burning 4. CAUSE: "What do you think is causing the symptoms?"     Maybe Iron 5. OTHER SYMPTOMS: "Do you have any other symptoms?" (e.g., blood in urine, fever, flank pain, pain with urination)     Stomach pain lower abdomen.  Protocols used: Urinary Symptoms-A-AH

## 2022-06-09 ENCOUNTER — Ambulatory Visit (INDEPENDENT_AMBULATORY_CARE_PROVIDER_SITE_OTHER): Payer: Medicaid Other | Admitting: Podiatry

## 2022-06-09 DIAGNOSIS — M79674 Pain in right toe(s): Secondary | ICD-10-CM | POA: Diagnosis not present

## 2022-06-09 DIAGNOSIS — M79675 Pain in left toe(s): Secondary | ICD-10-CM

## 2022-06-09 DIAGNOSIS — B351 Tinea unguium: Secondary | ICD-10-CM

## 2022-06-09 DIAGNOSIS — M2042 Other hammer toe(s) (acquired), left foot: Secondary | ICD-10-CM

## 2022-06-09 DIAGNOSIS — M2012 Hallux valgus (acquired), left foot: Secondary | ICD-10-CM

## 2022-06-10 ENCOUNTER — Ambulatory Visit: Payer: Medicaid Other | Admitting: Nurse Practitioner

## 2022-06-13 NOTE — Progress Notes (Signed)
Subjective: Chief Complaint  Patient presents with   Routine Post Op    POV2  Patient states her pain is an 8/10.       Kara Kelley is a 66 y.o. is seen today in office s/p left foot bunionectomy, secondary to hammertoe repair preformed on 05/25/2022.  States that she is having some discomfort.  She has had pain medicine from her back doctor.  Denies any systemic complaints such as fevers, chills, nausea, vomiting. No calf pain, chest pain, shortness of breath.  Asking for the nails be trimmed today as are thickened causing discomfort.  Objective: General: No acute distress, AAOx3  DP/PT pulses palpable 2/4, CRT < 3 sec to all digits.  Left foot: Incision is well coapted without any evidence of dehiscence with sutures intact mild. There is no surrounding erythema, ascending cellulitis, fluctuance, crepitus, malodor, drainage/purulence. There is mild edema around the surgical site. There is mild pain along the surgical site.  Toes are in rectus position. No other areas of tenderness to bilateral lower extremities.  Nails are hypertrophic, dystrophic, brittle, discolored, elongated 7. No surrounding redness or drainage. Tenderness nails 2-5 on the right and 3 through 5 on the left. No open lesions or pre-ulcerative lesions are identified today. No pain with calf compression, swelling, warmth, erythema.   Assessment and Plan:  Status post left foot surgery, doing well with no complications; onychosis  -Treatment options discussed including all alternatives, risks, and complications -Suture removed today without complications.  Incision is well coapted.  Medical management was applied.  Discussed that she can wash soap and water, dry thoroughly and apply a similar bandage if she will comfortable otherwise she can leave this dressing intact until follow-up. -Ice/elevation -Pain medication as needed -Sharp debridement nails x 7 without any complications or bleeding. -Monitor for any clinical  signs or symptoms of infection and DVT/PE and directed to call the office immediately should any occur or go to the ER. -Follow-up as scheduled or sooner if any problems arise. In the meantime, encouraged to call the office with any questions, concerns, change in symptoms.   X-ray's appointment  Ovid Curd, DPM

## 2022-06-14 ENCOUNTER — Ambulatory Visit: Payer: Medicaid Other

## 2022-06-14 ENCOUNTER — Telehealth: Payer: Self-pay | Admitting: *Deleted

## 2022-06-14 NOTE — Telephone Encounter (Signed)
Patient is calling to request a refill of medicine, in pain. Please advise

## 2022-06-15 NOTE — Telephone Encounter (Signed)
Spoke to the patient she stated that she will contact Dr. Dutch Quint for her refills.

## 2022-06-15 NOTE — Telephone Encounter (Signed)
Attempted to contact the patient, no answer/left a voicemail.

## 2022-06-23 ENCOUNTER — Encounter: Payer: Medicaid Other | Admitting: Podiatry

## 2022-06-24 ENCOUNTER — Encounter: Payer: Medicaid Other | Admitting: Podiatry

## 2022-06-27 ENCOUNTER — Telehealth: Payer: Self-pay | Admitting: *Deleted

## 2022-06-27 NOTE — Telephone Encounter (Signed)
Patient is calling because she feels that her pin is shifting, bent towards the left, in a lot of pain,please advise. Appointment is 06/28/22 @9 :15.

## 2022-06-27 NOTE — Telephone Encounter (Signed)
Patient has been called and given information per physician,verbalized understanding.

## 2022-06-28 ENCOUNTER — Encounter: Payer: Self-pay | Admitting: Podiatry

## 2022-06-28 ENCOUNTER — Ambulatory Visit (INDEPENDENT_AMBULATORY_CARE_PROVIDER_SITE_OTHER): Payer: Medicaid Other

## 2022-06-28 ENCOUNTER — Ambulatory Visit (INDEPENDENT_AMBULATORY_CARE_PROVIDER_SITE_OTHER): Payer: Medicaid Other | Admitting: Podiatry

## 2022-06-28 VITALS — Temp 98.9°F

## 2022-06-28 DIAGNOSIS — M2012 Hallux valgus (acquired), left foot: Secondary | ICD-10-CM | POA: Diagnosis not present

## 2022-06-28 DIAGNOSIS — M2042 Other hammer toe(s) (acquired), left foot: Secondary | ICD-10-CM

## 2022-06-28 DIAGNOSIS — Z9889 Other specified postprocedural states: Secondary | ICD-10-CM

## 2022-06-28 NOTE — Progress Notes (Signed)
Chief Complaint  Patient presents with   Post-op Follow-up    Patient is here for #3 post op visit.patient states that she has had some pain in the last couple of days.    Subjective:  Patient presents today status post bunionectomy with double osteotomy and hammertoe repair of the left foot performed by Dr. Jacqualyn Posey. DOS: 05/25/2022.  Patient states that she has noticed the percutaneous fixation pin to the second digit has backed out and is now protruding out of the toe.  She came in today to have it evaluated.  She states that she has been wearing the cam boot weightbearing as tolerated as instructed.  Past Medical History:  Diagnosis Date   Anemia    Anxiety    Arthritis    Asthma    Bunion    Callus    Chronic pain    Cocaine abuse (HCC)    COPD (chronic obstructive pulmonary disease) (HCC)    Corns and callosities    Degenerative joint disease    Depression    Diabetes mellitus    Endometrial polyp    ETOH abuse    Gall stones    GERD (gastroesophageal reflux disease)    Headache    history of Migraines   Hepatitis C    Hep C   Hyperlipidemia    Hypertension    Insomnia    Spondylolisthesis of lumbar region    Substance abuse ([place])    alcoholism   Tuberculosis 1985   Wears dentures    Wears glasses     Past Surgical History:  Procedure Laterality Date   ANTERIOR CERVICAL DECOMP/DISCECTOMY FUSION N/A 10/26/2021   Procedure: ACDF - C4-C5 - C5-C6 - C6-C7;  Surgeon: Earnie Larsson, MD;  Location: [facility];  Service: Neurosurgery;  Laterality: N/A;   BACK SURGERY  2018   CESAREAN SECTION     x3   CHOLECYSTECTOMY N/A 08/11/2016   Procedure: LAPAROSCOPIC CHOLECYSTECTOMY WITH   INTRAOPERATIVE CHOLANGIOGRAM;  Surgeon: Stark Klein, MD;  Location: [facility];  Service: General;  Laterality: N/A;   COLONOSCOPY     [place] Osteotomy w/ Graft Left 06/18/2009   Excision of Benign Lesion Right 01/30/2013   Rt Plantar   FOOT SURGERY     HAMMER TOE REPAIR Right 02/12/2016   RIGHT #5    Hammertoe Repair Left 06/18/2009   Lt #5   HYSTEROSCOPY N/A 06/20/2017   Procedure: DILATION AND CURETTAGE, HYSTEROSCOPY w/ Polypectomy;  Surgeon: Lavonia Drafts, MD;  Location: [facility];  Service: Gynecology;  Laterality: N/A;   LUMBAR FUSION  2018   METATARSAL OSTEOTOMY Left 06/18/2009   #5   MULTIPLE TOOTH EXTRACTIONS     Nail Matrixectomy Left 06/18/2009   LT #1   OSTEOTOMY Right 01/30/2013   Rt #5   Phalangectomy Left 06/18/2009   LT #1   Phalangectomy Right 01/30/2013   Rt #1   TUBAL LIGATION      No Known Allergies  Objective/Physical Exam Neurovascular status intact.  Skin incisions appear to be well coapted and healed.  No sign of infectious process noted. No dehiscence.  Negative for any appreciable edema as well.  Overall this is a well-healing surgical foot.  The percutaneous fixation pin is protruding out of the second toe about 2 inches.  Radiographic Exam:  Orthopedic hardware and osteotomies sites appear to be stable with routine healing with exception of the percutaneous fixation pin to the second digit which appears to have backed out. Also evidence of  prior surgery with absence of the fifth metatarsal head and head of the proximal phalanx  Assessment: 1. s/p bunionectomy with double osteotomy and hammertoe second digit left. DOS: 05/25/2022   Plan of Care:  1. Patient was evaluated. X-rays reviewed 2.  The percutaneous fixation pin was removed today without complication.  Antibiotic ointment and a light Band-Aid was applied 3.  Continue weightbearing as tolerated in the cam boot 4.  Patient may wash and shower and get the foot wet.   5.  Patient has a follow-up appointment scheduled already with Dr. Jacqualyn Posey, surgeon, on 07/21/2022.  Recommend that she keep that appointment for follow-up.  Continue weightbearing in the cam boot in the meantime.     Edrick Kins, DPM [facility]  Dr. Edrick Kins, DPM    [address], [place] [address]                Office [phone]  Fax [phone]

## 2022-06-29 ENCOUNTER — Telehealth: Payer: Self-pay | Admitting: *Deleted

## 2022-06-29 NOTE — Telephone Encounter (Signed)
Patient is calling to ask if she should get her foot wet or shower. Spoke with patient giving instructions from physician's epic note that it is ok to take a shower. She verbalized understanding.

## 2022-07-05 ENCOUNTER — Encounter: Payer: Medicaid Other | Admitting: Nurse Practitioner

## 2022-07-11 ENCOUNTER — Telehealth: Payer: Self-pay | Admitting: *Deleted

## 2022-07-11 NOTE — Telephone Encounter (Signed)
Patient is calling to ask if she may remove her cam boot before upcoming on the 12 th.  Explained to patient that per last office visit notes ,she is to remain in the boot until her appointment w/ physician, verbalized understanding.

## 2022-07-13 ENCOUNTER — Emergency Department (HOSPITAL_BASED_OUTPATIENT_CLINIC_OR_DEPARTMENT_OTHER): Payer: Medicaid Other

## 2022-07-13 ENCOUNTER — Encounter (HOSPITAL_BASED_OUTPATIENT_CLINIC_OR_DEPARTMENT_OTHER): Payer: Self-pay

## 2022-07-13 ENCOUNTER — Ambulatory Visit: Payer: Self-pay | Admitting: *Deleted

## 2022-07-13 ENCOUNTER — Emergency Department (HOSPITAL_BASED_OUTPATIENT_CLINIC_OR_DEPARTMENT_OTHER)
Admission: EM | Admit: 2022-07-13 | Discharge: 2022-07-13 | Discharge status: Against Medical Advice | Payer: Medicaid Other | Attending: Emergency Medicine | Admitting: Emergency Medicine

## 2022-07-13 ENCOUNTER — Other Ambulatory Visit: Payer: Self-pay

## 2022-07-13 DIAGNOSIS — R4781 Slurred speech: Secondary | ICD-10-CM | POA: Diagnosis present

## 2022-07-13 DIAGNOSIS — R4701 Aphasia: Secondary | ICD-10-CM | POA: Diagnosis not present

## 2022-07-13 DIAGNOSIS — R29810 Facial weakness: Secondary | ICD-10-CM | POA: Diagnosis not present

## 2022-07-13 DIAGNOSIS — Z5321 Procedure and treatment not carried out due to patient leaving prior to being seen by health care provider: Secondary | ICD-10-CM | POA: Diagnosis not present

## 2022-07-13 LAB — CBC
HCT: 36 % (ref 36.0–46.0)
Hemoglobin: 11.8 g/dL — ABNORMAL LOW (ref 12.0–15.0)
MCH: 23.6 pg — ABNORMAL LOW (ref 26.0–34.0)
MCHC: 32.8 g/dL (ref 30.0–36.0)
MCV: 71.9 fL — ABNORMAL LOW (ref 80.0–100.0)
Platelets: 201 10*3/uL (ref 150–400)
RBC: 5.01 MIL/uL (ref 3.87–5.11)
RDW: 15.1 % (ref 11.5–15.5)
WBC: 5.5 10*3/uL (ref 4.0–10.5)
nRBC: 0 % (ref 0.0–0.2)

## 2022-07-13 LAB — DIFFERENTIAL
Abs Immature Granulocytes: 0.01 10*3/uL (ref 0.00–0.07)
Basophils Absolute: 0 10*3/uL (ref 0.0–0.1)
Basophils Relative: 1 %
Eosinophils Absolute: 0.1 10*3/uL (ref 0.0–0.5)
Eosinophils Relative: 2 %
Immature Granulocytes: 0 %
Lymphocytes Relative: 37 %
Lymphs Abs: 2 10*3/uL (ref 0.7–4.0)
Monocytes Absolute: 0.3 10*3/uL (ref 0.1–1.0)
Monocytes Relative: 6 %
Neutro Abs: 3 10*3/uL (ref 1.7–7.7)
Neutrophils Relative %: 54 %

## 2022-07-13 LAB — COMPREHENSIVE METABOLIC PANEL
ALT: 20 U/L (ref 0–44)
AST: 19 U/L (ref 15–41)
Albumin: 4.6 g/dL (ref 3.5–5.0)
Alkaline Phosphatase: 157 U/L — ABNORMAL HIGH (ref 38–126)
Anion gap: 9 (ref 5–15)
BUN: 14 mg/dL (ref 8–23)
CO2: 21 mmol/L — ABNORMAL LOW (ref 22–32)
Calcium: 10 mg/dL (ref 8.9–10.3)
Chloride: 105 mmol/L (ref 98–111)
Creatinine, Ser: 0.99 mg/dL (ref 0.44–1.00)
GFR, Estimated: >60 mL/min (ref >60)
Glucose, Bld: 165 mg/dL — ABNORMAL HIGH (ref 70–99)
Potassium: 4.4 mmol/L (ref 3.5–5.1)
Sodium: 135 mmol/L (ref 135–145)
Total Bilirubin: 0.5 mg/dL (ref 0.3–1.2)
Total Protein: 8 g/dL (ref 6.5–8.1)

## 2022-07-13 LAB — PROTIME-INR
INR: 1 (ref 0.8–1.2)
Prothrombin Time: 13.4 seconds (ref 11.4–15.2)

## 2022-07-13 LAB — ETHANOL: Alcohol, Ethyl (B): <10 mg/dL (ref <10)

## 2022-07-13 LAB — APTT: aPTT: 27 seconds (ref 24–36)

## 2022-07-13 LAB — CBG MONITORING, ED: Glucose-Capillary: 165 mg/dL — ABNORMAL HIGH (ref 70–99)

## 2022-07-13 NOTE — Telephone Encounter (Signed)
  Chief Complaint: daughter not with patient and reports patient with left facial droop and slurred speech Symptoms: see above Frequency: since yesterday  Pertinent Negatives: Patient denies na  Disposition: [x] ED /[] Urgent Care (no appt availability in office) / [] Appointment(In office/virtual)/ []  [facility]/ [] Home Care/ [] Refused Recommended Disposition /[] [facility]/ []  Follow-up with PCP Additional Notes:   Instructed patient's daughter to call 911 and have patient to go to ED for evaluation now.      Reason for Disposition  Sounds like a life-threatening emergency to the triager  Answer Assessment - Initial Assessment Questions 1. SYMPTOM: "What is the main symptom you are concerned about?" (e.g., weakness, numbness)     Patient's daughter Jonelle Sidle not with patient. Reports left facial droop, slurred speech 2. ONSET: "When did this start?" (minutes, hours, days; while sleeping)     Yesterday  3. LAST NORMAL: "When was the last time you (the patient) were normal (no symptoms)?"     Unsure  4. PATTERN "Does this come and go, or has it been constant since it started?"  "Is it present now?"     Present now  5. CARDIAC SYMPTOMS: "Have you had any of the following symptoms: chest pain, difficulty breathing, palpitations?"     Na  6. NEUROLOGIC SYMPTOMS: "Have you had any of the following symptoms: headache, dizziness, vision loss, double vision, changes in speech, unsteady on your feet?"     Left facial droop  and slurred speech  7. OTHER SYMPTOMS: "Do you have any other symptoms?"     Yes see above  8. PREGNANCY: "Is there any chance you are pregnant?" "When was your last menstrual period?"     na  Protocols used: Neurologic Deficit-A-AH

## 2022-07-13 NOTE — ED Triage Notes (Signed)
Pt arrives with daughter. Pt last known well 4 days ago. Per daughter, pt is having slurred speech. Pt is also having right sided facial droop. Pt does have FROM bilaterally, sensation equal. Family initially attributed symptoms to anxiety attack.

## 2022-07-14 NOTE — Telephone Encounter (Signed)
Pt went to ED on yesterday

## 2022-07-17 NOTE — Progress Notes (Signed)
Erroneous encounter-disregard

## 2022-07-21 ENCOUNTER — Encounter: Payer: Medicaid Other | Admitting: Podiatry

## 2022-07-22 ENCOUNTER — Encounter: Payer: Medicaid Other | Admitting: Family

## 2022-07-27 ENCOUNTER — Telehealth: Payer: Self-pay | Admitting: Family

## 2022-07-27 NOTE — Telephone Encounter (Signed)
Copied from [place] [id number]. Topic: Appointment Scheduling - Scheduling Inquiry for Clinic >> Jul 27, 2022 12:51 PM Oley Balm E wrote: Reason for CRM: Pt wants to get the RSV, the pneumonia vaccine, and whatever else she is due for at her age. please advise

## 2022-07-27 NOTE — Telephone Encounter (Signed)
Pt needs to schedule an appt for immunization request

## 2022-08-02 ENCOUNTER — Ambulatory Visit: Payer: Medicaid Other

## 2022-08-02 ENCOUNTER — Ambulatory Visit (INDEPENDENT_AMBULATORY_CARE_PROVIDER_SITE_OTHER): Payer: Medicaid Other

## 2022-08-02 DIAGNOSIS — Z23 Encounter for immunization: Secondary | ICD-10-CM

## 2022-08-03 ENCOUNTER — Ambulatory Visit: Payer: Medicaid Other | Admitting: Family

## 2022-08-03 NOTE — Progress Notes (Deleted)
Patient ID: Kara Kelley, female    DOB: 1956-06-30  MRN: [id number]  CC: No chief complaint on file.   Subjective: Minta Fair is a 66 y.o. female who presents for  Her concerns today include:   Discuss RSV and Covid vaccines    Patient Active Problem List   Diagnosis Date Noted   Bacterial vaginosis 05/16/2022   Cholelithiasis without obstruction 04/11/2022   Chronic idiopathic constipation 04/11/2022   Gallbladder calculus with acute cholecystitis and no obstruction 04/11/2022   History of adenomatous polyp of colon 04/11/2022   Morbid obesity ([place]) 04/11/2022   Polysubstance abuse ([place]) 04/11/2022   Right lower quadrant pain 04/11/2022   Type 2 diabetes mellitus ([place]) 04/11/2022   Type II diabetes mellitus ([place]) 04/11/2022   Pharyngoesophageal dysphagia 04/05/2022   Hyperthyroidism 12/29/2021   Cervical spondylosis with myelopathy and radiculopathy 10/26/2021   Bilateral impacted cerumen 09/16/2020   Itching of ear 09/16/2020   Heartburn 07/10/2020   Lumbar radiculopathy 04/03/2020   Hearing loss of right ear 03/12/2020   Plantar flexed metatarsal bone of left foot 08/14/2019   Skin burn 07/09/2019   Uncontrolled type 2 diabetes mellitus with hyperglycemia ([place]) 06/19/2019   Hypertensive retinopathy of both eyes 03/06/2019   Mild nonproliferative diabetic retinopathy of both eyes ([place]) 03/06/2019   Essential hypertension 10/05/2018   Cramp and spasm 07/20/2018   Lactic acidosis    SIRS (systemic inflammatory response syndrome) ([place])    Acute renal failure ([place])    Type 2 diabetes mellitus with stage 3b chronic kidney disease, without long-term current use of insulin ([place])    Acute blood loss anemia    Sepsis ([place]) 07/17/2017   Hyponatremia 07/17/2017   Postoperative wound infection 07/17/2017   AKI (acute kidney injury) ([place]) 07/17/2017   Dehydration 07/17/2017   DM type 2 (diabetes mellitus, type 2) ([place]) 07/17/2017   Acute encephalopathy 07/17/2017    Degenerative spondylolisthesis 07/03/2017   Post-menopausal bleeding 06/20/2017   Chronic hepatitis C ([place]) 12/09/2015   Inclusion cyst 09/16/2014   Onychomycosis 08/15/2014   Type I (juvenile type) diabetes mellitus with renal manifestations, not stated as uncontrolled(250.41) 06/10/2014   Unspecified constipation 05/22/2014   Gastroesophageal reflux disease without esophagitis 05/22/2014   Pain in lower limb 01/29/2014   Eschar of multiple sites 07/31/2013   Porokeratosis 07/02/2013   Cyst of joint of ankle or foot 06/04/2013   History of endometrial ablation 02/06/2013   Pain in joint, ankle and foot 01/23/2013   Callus of foot 01/23/2013   Ingrown nail 01/23/2013   Deformity of metatarsal 01/23/2013     Current Outpatient Medications on File Prior to Visit  Medication Sig Dispense Refill   Accu-Chek FastClix Lancets MISC Use as directed to check blood sugars 2 times per day dx: e11.65 102 each prn   Accu-Chek Softclix Lancets lancets Use as instructed to check blood sugars twice daily E11.69 100 each 2   albuterol (VENTOLIN HFA) 108 (90 Base) MCG/ACT inhaler Inhale 2 puffs into the lungs every 6 (six) hours as needed for wheezing or shortness of breath. 18 g 2   ARIPiprazole (ABILIFY) 10 MG tablet Take 10 mg by mouth daily.      atorvastatin (LIPITOR) 10 MG tablet Take 1 tablet (10 mg total) by mouth daily. 90 tablet 1   Blood Glucose Monitoring Suppl (ACCU-CHEK GUIDE ME) w/Device KIT Use to check blood sugars twice daily E11.69 1 kit 1   Blood Glucose Monitoring Suppl (ACCU-CHEK NANO SMARTVIEW) w/Device KIT  Use as directed to check blood sugars 2 times per day dx: e11.65 1 kit 0   buPROPion (WELLBUTRIN XL) 300 MG 24 hr tablet Take 1 tablet (300 mg total) by mouth daily. 90 tablet 0   cyclobenzaprine (FLEXERIL) 5 MG tablet Take 1 tablet (5 mg total) by mouth 3 (three) times daily as needed for muscle spasms. 90 tablet 0   fluticasone (FLONASE) 50 MCG/ACT nasal spray Place 1 spray  into both nostrils daily. 16 g 2   fluticasone-salmeterol (ADVAIR DISKUS) 250-50 MCG/ACT AEPB Inhale 1 puff into the lungs in the morning and at bedtime. 60 each 6   Glucagon (GVOKE HYPOPEN 2-PACK) 1 MG/0.2ML SOAJ Inject 1 each into the skin as needed. If blood sugar less than 60 0.4 mL 3   glucose blood (ACCU-CHEK GUIDE) test strip Use as directed to check blood sugars 2 times per day dx: e11.69 300 strip 2   hydrOXYzine (ATARAX) 25 MG tablet Take 1 tablet (25 mg total) by mouth every 8 (eight) hours as needed for anxiety. 30 tablet 1   insulin detemir (LEVEMIR FLEXTOUCH) 100 UNIT/ML FlexPen Inject 32 Units into the skin at bedtime. 30 mL 0   Insulin Pen Needle (BD PEN NEEDLE NANO 2ND GEN) 32G X 4 MM MISC Inject 1 each into the skin daily. as directed 200 each 0   Iron, Ferrous Sulfate, 325 (65 Fe) MG TABS Take 325 mg by mouth daily. 30 tablet 2   LINZESS 145 MCG CAPS capsule Take 145 mcg by mouth daily before breakfast.      lisinopril (ZESTRIL) 10 MG tablet TAKE 1 TABLET(10 MG) BY MOUTH DAILY 90 tablet 0   metFORMIN (GLUCOPHAGE-XR) 500 MG 24 hr tablet TAKE 1 TABLET(500 MG) BY MOUTH DAILY WITH BREAKFAST 90 tablet 1   methimazole (TAPAZOLE) 5 MG tablet Take 1 tablet (5 mg total) by mouth daily. 90 tablet 1   miconazole (MONISTAT 7 SIMPLY CURE) 2 % vaginal cream Place 1 Applicatorful vaginally at bedtime. 45 g 0   Multiple Vitamins-Minerals (MULTIVITAMIN WITH MINERALS) tablet Take 1 tablet by mouth daily. 50 +     NOVOLOG FLEXPEN 100 UNIT/ML FlexPen Inject 18 Units into the skin 3 (three) times daily with meals. 15 mL 3   omeprazole (PRILOSEC) 40 MG capsule TAKE 1 CAPSULE(40 MG) BY MOUTH DAILY BEFORE BREAKFAST 90 capsule 1   oxyCODONE-acetaminophen (PERCOCET) 5-325 MG tablet Take 1 tablet by mouth every 4 (four) hours as needed. 30 tablet 0   promethazine (PHENERGAN) 25 MG tablet Take 1 tablet (25 mg total) by mouth every 8 (eight) hours as needed for nausea or vomiting. 15 tablet 0   Semaglutide,  1 MG/DOSE, (OZEMPIC, 1 MG/DOSE,) 4 MG/3ML SOPN Inject 1 mg into the skin once a week. 9 mL 0   spironolactone (ALDACTONE) 50 MG tablet TAKE 1 TABLET(50 MG) BY MOUTH DAILY 90 tablet 0   traZODone (DESYREL) 150 MG tablet Take 150 mg by mouth at bedtime.      No current facility-administered medications on file prior to visit.    No Known Allergies  Social History   Socioeconomic History   Marital status: Single    Spouse name: Not on file   Number of children: 3   Years of education: Not on file   Highest education level: Not on file  Occupational History   Occupation: disablily  Tobacco Use   Smoking status: Former    Packs/day: 0.75    Years: 47.00    Total pack  years: 35.25    Types: Cigarettes    Quit date: 2020    Years since quitting: 3.8    Passive exposure: Past   Smokeless tobacco: Never  Vaping Use   Vaping Use: Never used  Substance and Sexual Activity   Alcohol use: No    Alcohol/week: 0.0 standard drinks of alcohol    Comment: in rehab   Drug use: No    Comment: in rehab   Sexual activity: Not Currently    Birth control/protection: Post-menopausal  Other Topics Concern   Not on file  Social History Narrative   Not on file   Social Determinants of Health   Financial Resource Strain: Medium Risk (06/23/2020)   Overall Financial Resource Strain (CARDIA)    Difficulty of Paying Living Expenses: Somewhat hard  Food Insecurity: No Food Insecurity (03/27/2020)   Hunger Vital Sign    Worried About Running Out of Food in the Last Year: Never true    Ran Out of Food in the Last Year: Never true  Transportation Needs: No Transportation Needs (03/27/2020)   PRAPARE - Hydrologist (Medical): No    Lack of Transportation (Non-Medical): No  Physical Activity: Not on file  Stress: Not on file  Social Connections: Not on file  Intimate Partner Violence: Not on file    Family History  Problem Relation Age of Onset   Heart disease  Mother    Diabetes Other        mat great aunt   Cirrhosis Other        mat great aunt   Cirrhosis Other        mat great uncles x 2   Colon cancer Neg Hx    Breast cancer Neg Hx     Past Surgical History:  Procedure Laterality Date   ANTERIOR CERVICAL DECOMP/DISCECTOMY FUSION N/A 10/26/2021   Procedure: ACDF - C4-C5 - C5-C6 - C6-C7;  Surgeon: Earnie Larsson, MD;  Location: [facility];  Service: Neurosurgery;  Laterality: N/A;   BACK SURGERY  2018   CESAREAN SECTION     x3   CHOLECYSTECTOMY N/A 08/11/2016   Procedure: LAPAROSCOPIC CHOLECYSTECTOMY WITH   INTRAOPERATIVE CHOLANGIOGRAM;  Surgeon: Stark Klein, MD;  Location: [facility];  Service: General;  Laterality: N/A;   COLONOSCOPY     Cotton Osteotomy w/ Graft Left 06/18/2009   Excision of Benign Lesion Right 01/30/2013   Rt Plantar   FOOT SURGERY     HAMMER TOE REPAIR Right 02/12/2016   RIGHT #5   Hammertoe Repair Left 06/18/2009   Lt #5   HYSTEROSCOPY N/A 06/20/2017   Procedure: DILATION AND CURETTAGE, HYSTEROSCOPY w/ Polypectomy;  Surgeon: Lavonia Drafts, MD;  Location: [facility];  Service: Gynecology;  Laterality: N/A;   LUMBAR FUSION  2018   METATARSAL OSTEOTOMY Left 06/18/2009   #5   MULTIPLE TOOTH EXTRACTIONS     Nail Matrixectomy Left 06/18/2009   LT #1   OSTEOTOMY Right 01/30/2013   Rt #5   Phalangectomy Left 06/18/2009   LT #1   Phalangectomy Right 01/30/2013   Rt #1   TUBAL LIGATION      ROS: Review of Systems Negative except as stated above  PHYSICAL EXAM: There were no vitals taken for this visit.  Physical Exam  {female adult master:[id number]} {female adult master:[id number]}     Latest Ref Rng & Units 07/13/2022    6:15 PM 05/13/2022   10:30 AM 10/19/2021   11:42 AM  CMP  Glucose 70 - 99 mg/dL 165  115  149   BUN 8 - 23 mg/dL '14  10  11   ' Creatinine 0.44 - 1.00 mg/dL 0.99  0.89  1.01   Sodium 135 - 145 mmol/L 135  138  138   Potassium 3.5 - 5.1 mmol/L 4.4  4.6  4.4   Chloride 98 - 111 mmol/L 105  104  105   CO2  22 - 32 mmol/L '21  20  21   ' Calcium 8.9 - 10.3 mg/dL 10.0  9.9  10.1   Total Protein 6.5 - 8.1 g/dL 8.0  6.6  6.7   Total Bilirubin 0.3 - 1.2 mg/dL 0.5  0.3  0.2   Alkaline Phos 38 - 126 U/L 157  163  82   AST 15 - 41 U/L '19  19  17   ' ALT 0 - 44 U/L '20  16  19    ' Lipid Panel     Component Value Date/Time   CHOL 126 12/28/2021 0902   TRIG 102 12/28/2021 0902   HDL 49 12/28/2021 0902   CHOLHDL 2.6 12/28/2021 0902   LDLCALC 58 12/28/2021 0902    CBC    Component Value Date/Time   WBC 5.5 07/13/2022 1815   RBC 5.01 07/13/2022 1815   HGB 11.8 (L) 07/13/2022 1815   HGB 10.5 (L) 05/13/2022 1030   HCT 36.0 07/13/2022 1815   HCT 31.8 (L) 05/13/2022 1030   PLT 201 07/13/2022 1815   PLT 206 05/13/2022 1030   MCV 71.9 (L) 07/13/2022 1815   MCV 73 (L) 05/13/2022 1030   MCH 23.6 (L) 07/13/2022 1815   MCHC 32.8 07/13/2022 1815   RDW 15.1 07/13/2022 1815   RDW 15.2 05/13/2022 1030   LYMPHSABS 2.0 07/13/2022 1815   MONOABS 0.3 07/13/2022 1815   EOSABS 0.1 07/13/2022 1815   BASOSABS 0.0 07/13/2022 1815    ASSESSMENT AND PLAN:  There are no diagnoses linked to this encounter.   Patient was given the opportunity to ask questions.  Patient verbalized understanding of the plan and was able to repeat key elements of the plan. Patient was given clear instructions to go to Emergency Department or return to medical center if symptoms don't improve, worsen, or new problems develop.The patient verbalized understanding.   No orders of the defined types were placed in this encounter.    Requested Prescriptions    No prescriptions requested or ordered in this encounter    No follow-ups on file.  Camillia Herter, NP

## 2022-08-11 NOTE — Progress Notes (Signed)
Patient ID: Kara Kelley, female    DOB: 09-04-1956  MRN: [id number]  CC: Hip Pain   Subjective: Kara Kelley is a 66 y.o. female who presents for hip pain. She is accompanied by her cousin-sister Mikle Bosworth.   Her concerns today include:  Reports this morning she fell while coming out of the house. She did not hit her head. She fell onto bilateral knees. She was using a walker prior to fall. States "My legs gave out." Her cousin-sister had to call maintenance to come and help her off of the ground. Currently right-side lower back pain radiating to the right hip. Using a wheelchair today in office. She was established with Orthopedics in the past for chronic right knee pain. No further issues/concerns.   Patient Active Problem List   Diagnosis Date Noted   Bacterial vaginosis 05/16/2022   Cholelithiasis without obstruction 04/11/2022   Chronic idiopathic constipation 04/11/2022   Gallbladder calculus with acute cholecystitis and no obstruction 04/11/2022   History of adenomatous polyp of colon 04/11/2022   Morbid obesity ([place]) 04/11/2022   Polysubstance abuse ([place]) 04/11/2022   Right lower quadrant pain 04/11/2022   Type 2 diabetes mellitus ([place]) 04/11/2022   Type II diabetes mellitus ([place]) 04/11/2022   Pharyngoesophageal dysphagia 04/05/2022   Hyperthyroidism 12/29/2021   Cervical spondylosis with myelopathy and radiculopathy 10/26/2021   Bilateral impacted cerumen 09/16/2020   Itching of ear 09/16/2020   Heartburn 07/10/2020   Lumbar radiculopathy 04/03/2020   Hearing loss of right ear 03/12/2020   Plantar flexed metatarsal bone of left foot 08/14/2019   Skin burn 07/09/2019   Uncontrolled type 2 diabetes mellitus with hyperglycemia ([place]) 06/19/2019   Hypertensive retinopathy of both eyes 03/06/2019   Mild nonproliferative diabetic retinopathy of both eyes ([place]) 03/06/2019   Essential hypertension 10/05/2018   Cramp and spasm 07/20/2018   Lactic acidosis    SIRS  (systemic inflammatory response syndrome) ([place])    Acute renal failure ([place])    Type 2 diabetes mellitus with stage 3b chronic kidney disease, without long-term current use of insulin ([place])    Acute blood loss anemia    Sepsis ([place]) 07/17/2017   Hyponatremia 07/17/2017   Postoperative wound infection 07/17/2017   AKI (acute kidney injury) ([place]) 07/17/2017   Dehydration 07/17/2017   DM type 2 (diabetes mellitus, type 2) ([place]) 07/17/2017   Acute encephalopathy 07/17/2017   Degenerative spondylolisthesis 07/03/2017   Post-menopausal bleeding 06/20/2017   Chronic hepatitis C ([place]) 12/09/2015   Inclusion cyst 09/16/2014   Onychomycosis 08/15/2014   Type I (juvenile type) diabetes mellitus with renal manifestations, not stated as uncontrolled(250.41) 06/10/2014   Unspecified constipation 05/22/2014   Gastroesophageal reflux disease without esophagitis 05/22/2014   Pain in lower limb 01/29/2014   Eschar of multiple sites 07/31/2013   Porokeratosis 07/02/2013   Cyst of joint of ankle or foot 06/04/2013   History of endometrial ablation 02/06/2013   Pain in joint, ankle and foot 01/23/2013   Callus of foot 01/23/2013   Ingrown nail 01/23/2013   Deformity of metatarsal 01/23/2013     Current Outpatient Medications on File Prior to Visit  Medication Sig Dispense Refill   Accu-Chek FastClix Lancets MISC Use as directed to check blood sugars 2 times per day dx: e11.65 102 each prn   Accu-Chek Softclix Lancets lancets Use as instructed to check blood sugars twice daily E11.69 100 each 2   albuterol (VENTOLIN HFA) 108 (90 Base) MCG/ACT inhaler Inhale 2 puffs into the lungs every  6 (six) hours as needed for wheezing or shortness of breath. 18 g 2   ARIPiprazole (ABILIFY) 10 MG tablet Take 10 mg by mouth daily.      atorvastatin (LIPITOR) 10 MG tablet Take 1 tablet (10 mg total) by mouth daily. 90 tablet 1   Blood Glucose Monitoring Suppl (ACCU-CHEK GUIDE ME) w/Device KIT Use to check blood  sugars twice daily E11.69 1 kit 1   Blood Glucose Monitoring Suppl (ACCU-CHEK NANO SMARTVIEW) w/Device KIT Use as directed to check blood sugars 2 times per day dx: e11.65 1 kit 0   buPROPion (WELLBUTRIN XL) 300 MG 24 hr tablet Take 1 tablet (300 mg total) by mouth daily. 90 tablet 0   cyclobenzaprine (FLEXERIL) 5 MG tablet Take 1 tablet (5 mg total) by mouth 3 (three) times daily as needed for muscle spasms. 90 tablet 0   fluticasone (FLONASE) 50 MCG/ACT nasal spray Place 1 spray into both nostrils daily. 16 g 2   fluticasone-salmeterol (ADVAIR DISKUS) 250-50 MCG/ACT AEPB Inhale 1 puff into the lungs in the morning and at bedtime. 60 each 6   Glucagon (GVOKE HYPOPEN 2-PACK) 1 MG/0.2ML SOAJ Inject 1 each into the skin as needed. If blood sugar less than 60 0.4 mL 3   glucose blood (ACCU-CHEK GUIDE) test strip Use as directed to check blood sugars 2 times per day dx: e11.69 300 strip 2   hydrOXYzine (ATARAX) 25 MG tablet Take 1 tablet (25 mg total) by mouth every 8 (eight) hours as needed for anxiety. 30 tablet 1   insulin detemir (LEVEMIR FLEXTOUCH) 100 UNIT/ML FlexPen Inject 32 Units into the skin at bedtime. 30 mL 0   Insulin Pen Needle (BD PEN NEEDLE NANO 2ND GEN) 32G X 4 MM MISC Inject 1 each into the skin daily. as directed 200 each 0   Iron, Ferrous Sulfate, 325 (65 Fe) MG TABS Take 325 mg by mouth daily. 30 tablet 2   LINZESS 145 MCG CAPS capsule Take 145 mcg by mouth daily before breakfast.      lisinopril (ZESTRIL) 10 MG tablet TAKE 1 TABLET(10 MG) BY MOUTH DAILY 90 tablet 0   metFORMIN (GLUCOPHAGE-XR) 500 MG 24 hr tablet TAKE 1 TABLET(500 MG) BY MOUTH DAILY WITH BREAKFAST 90 tablet 1   methimazole (TAPAZOLE) 5 MG tablet Take 1 tablet (5 mg total) by mouth daily. 90 tablet 1   miconazole (MONISTAT 7 SIMPLY CURE) 2 % vaginal cream Place 1 Applicatorful vaginally at bedtime. 45 g 0   Multiple Vitamins-Minerals (MULTIVITAMIN WITH MINERALS) tablet Take 1 tablet by mouth daily. 50 +     NOVOLOG  FLEXPEN 100 UNIT/ML FlexPen Inject 18 Units into the skin 3 (three) times daily with meals. 15 mL 3   omeprazole (PRILOSEC) 40 MG capsule TAKE 1 CAPSULE(40 MG) BY MOUTH DAILY BEFORE BREAKFAST 90 capsule 1   oxyCODONE-acetaminophen (PERCOCET) 5-325 MG tablet Take 1 tablet by mouth every 4 (four) hours as needed. 30 tablet 0   promethazine (PHENERGAN) 25 MG tablet Take 1 tablet (25 mg total) by mouth every 8 (eight) hours as needed for nausea or vomiting. 15 tablet 0   Semaglutide, 1 MG/DOSE, (OZEMPIC, 1 MG/DOSE,) 4 MG/3ML SOPN Inject 1 mg into the skin once a week. 9 mL 0   spironolactone (ALDACTONE) 50 MG tablet TAKE 1 TABLET(50 MG) BY MOUTH DAILY 90 tablet 0   traZODone (DESYREL) 150 MG tablet Take 150 mg by mouth at bedtime.      No current facility-administered medications on  file prior to visit.    No Known Allergies  Social History   Socioeconomic History   Marital status: Single    Spouse name: Not on file   Number of children: 3   Years of education: Not on file   Highest education level: Not on file  Occupational History   Occupation: disablily  Tobacco Use   Smoking status: Former    Packs/day: 0.75    Years: 47.00    Total pack years: 35.25    Types: Cigarettes    Quit date: 2020    Years since quitting: 3.8    Passive exposure: Past   Smokeless tobacco: Never  Vaping Use   Vaping Use: Never used  Substance and Sexual Activity   Alcohol use: No    Alcohol/week: 0.0 standard drinks of alcohol    Comment: in rehab   Drug use: No    Comment: in rehab   Sexual activity: Not Currently    Birth control/protection: Post-menopausal  Other Topics Concern   Not on file  Social History Narrative   Not on file   Social Determinants of Health   Financial Resource Strain: Medium Risk (06/23/2020)   Overall Financial Resource Strain (CARDIA)    Difficulty of Paying Living Expenses: Somewhat hard  Food Insecurity: No Food Insecurity (03/27/2020)   Hunger Vital Sign     Worried About Running Out of Food in the Last Year: Never true    Ran Out of Food in the Last Year: Never true  Transportation Needs: No Transportation Needs (03/27/2020)   PRAPARE - Hydrologist (Medical): No    Lack of Transportation (Non-Medical): No  Physical Activity: Not on file  Stress: Not on file  Social Connections: Not on file  Intimate Partner Violence: Not on file    Family History  Problem Relation Age of Onset   Heart disease Mother    Diabetes Other        mat great aunt   Cirrhosis Other        mat great aunt   Cirrhosis Other        mat great uncles x 2   Colon cancer Neg Hx    Breast cancer Neg Hx     Past Surgical History:  Procedure Laterality Date   ANTERIOR CERVICAL DECOMP/DISCECTOMY FUSION N/A 10/26/2021   Procedure: ACDF - C4-C5 - C5-C6 - C6-C7;  Surgeon: Earnie Larsson, MD;  Location: [facility];  Service: Neurosurgery;  Laterality: N/A;   BACK SURGERY  2018   CESAREAN SECTION     x3   CHOLECYSTECTOMY N/A 08/11/2016   Procedure: LAPAROSCOPIC CHOLECYSTECTOMY WITH   INTRAOPERATIVE CHOLANGIOGRAM;  Surgeon: Stark Klein, MD;  Location: [facility];  Service: General;  Laterality: N/A;   COLONOSCOPY     [place]ton Osteotomy w/ Graft Left 06/18/2009   Excision of Benign Lesion Right 01/30/2013   Rt Plantar   FOOT SURGERY     HAMMER TOE REPAIR Right 02/12/2016   RIGHT #5   Hammertoe Repair Left 06/18/2009   Lt #5   HYSTEROSCOPY N/A 06/20/2017   Procedure: DILATION AND CURETTAGE, HYSTEROSCOPY w/ Polypectomy;  Surgeon: Lavonia Drafts, MD;  Location: [facility];  Service: Gynecology;  Laterality: N/A;   LUMBAR FUSION  2018   METATARSAL OSTEOTOMY Left 06/18/2009   #5   MULTIPLE TOOTH EXTRACTIONS     Nail Matrixectomy Left 06/18/2009   LT #1   OSTEOTOMY Right 01/30/2013   Rt #5   Phalangectomy  Left 06/18/2009   LT #1   Phalangectomy Right 01/30/2013   Rt #1   TUBAL LIGATION      ROS: Review of Systems Negative except as stated  above  PHYSICAL EXAM: BP 126/80 (BP Location: Right Arm, Patient Position: Sitting, Cuff Size: Normal)   Pulse 100   Temp 98.3 F (36.8 C)   Resp 16   Ht 4' 11.02" (1.499 m)   SpO2 94%   BMI 34.12 kg/m    Physical Exam HENT:     Head: Normocephalic and atraumatic.  Eyes:     Extraocular Movements: Extraocular movements intact.     Conjunctiva/sclera: Conjunctivae normal.     Pupils: Pupils are equal, round, and reactive to light.  Cardiovascular:     Rate and Rhythm: Normal rate and regular rhythm.     Pulses: Normal pulses.     Heart sounds: Normal heart sounds.  Pulmonary:     Effort: Pulmonary effort is normal.     Breath sounds: Normal breath sounds.  Musculoskeletal:     Right shoulder: Normal.     Left shoulder: Normal.     Right upper arm: Normal.     Left upper arm: Normal.     Right elbow: Normal.     Left elbow: Normal.     Right forearm: Normal.     Left forearm: Normal.     Right wrist: Normal.     Left wrist: Normal.     Right hand: Normal.     Left hand: Normal.     Cervical back: Normal, normal range of motion and neck supple.     Thoracic back: Normal.     Lumbar back: Tenderness present.     Right hip: Tenderness present.     Left hip: Normal.     Right upper leg: Normal.     Left upper leg: Normal.     Right knee: Normal.     Left knee: Normal.     Right lower leg: Normal.     Left lower leg: Normal.     Right ankle: Normal.     Left ankle: Normal.     Right foot: Normal.     Left foot: Normal.  Neurological:     General: No focal deficit present.     Mental Status: She is alert and oriented to person, place, and time.  Psychiatric:        Mood and Affect: Mood normal.        Behavior: Behavior normal.     ASSESSMENT AND PLAN: 1. Acute right-sided low back pain, unspecified whether sciatica present 2. Right hip pain 3. Chronic pain of right knee - Ketorolac injection and Triamcinolone Acetonide injection administered in office.   - Diagnostic lumbar spine for further evaluation. - Diagnostic right hip for further evaluation.  - Referral to Orthopedic Surgery for further evaluation and management.  - ketorolac (TORADOL) injection 60 mg - triamcinolone acetonide (KENALOG-40) injection 40 mg - DG Hip Unilat W OR W/O Pelvis Min 4 Views Right; Future - DG Lumbar Spine Complete; Future - Ambulatory referral to Orthopedic Surgery    Patient was given the opportunity to ask questions.  Patient verbalized understanding of the plan and was able to repeat key elements of the plan. Patient was given clear instructions to go to Emergency Department or return to medical center if symptoms don't improve, worsen, or new problems develop.The patient verbalized understanding.   Orders Placed This Encounter  Procedures  DG Lumbar Spine Complete   DG Hip Unilat W OR W/O Pelvis Min 4 Views Right   Ambulatory referral to Orthopedic Surgery    Follow-up with primary provider as scheduled.  Camillia Herter, NP

## 2022-08-12 ENCOUNTER — Ambulatory Visit: Payer: Medicaid Other | Admitting: Family

## 2022-08-19 ENCOUNTER — Encounter: Payer: Self-pay | Admitting: Family

## 2022-08-19 ENCOUNTER — Inpatient Hospital Stay (HOSPITAL_BASED_OUTPATIENT_CLINIC_OR_DEPARTMENT_OTHER)
Admission: EM | Admit: 2022-08-19 | Discharge: 2022-08-24 | DRG: 454 | Disposition: A | Discharge status: Rehabilitation Facility | Payer: Medicaid Other | Attending: Internal Medicine | Admitting: Internal Medicine

## 2022-08-19 ENCOUNTER — Ambulatory Visit (INDEPENDENT_AMBULATORY_CARE_PROVIDER_SITE_OTHER): Payer: Medicaid Other | Admitting: Family

## 2022-08-19 ENCOUNTER — Other Ambulatory Visit: Payer: Self-pay

## 2022-08-19 ENCOUNTER — Encounter (HOSPITAL_BASED_OUTPATIENT_CLINIC_OR_DEPARTMENT_OTHER): Payer: Self-pay | Admitting: Emergency Medicine

## 2022-08-19 ENCOUNTER — Ambulatory Visit: Payer: Self-pay | Admitting: *Deleted

## 2022-08-19 VITALS — BP 126/80 | HR 100 | Temp 98.3°F | Resp 16 | Ht 59.02 in

## 2022-08-19 DIAGNOSIS — F1910 Other psychoactive substance abuse, uncomplicated: Secondary | ICD-10-CM | POA: Diagnosis present

## 2022-08-19 DIAGNOSIS — M25551 Pain in right hip: Secondary | ICD-10-CM

## 2022-08-19 DIAGNOSIS — E669 Obesity, unspecified: Secondary | ICD-10-CM | POA: Diagnosis present

## 2022-08-19 DIAGNOSIS — F32A Depression, unspecified: Secondary | ICD-10-CM | POA: Diagnosis present

## 2022-08-19 DIAGNOSIS — E1122 Type 2 diabetes mellitus with diabetic chronic kidney disease: Secondary | ICD-10-CM

## 2022-08-19 DIAGNOSIS — G8929 Other chronic pain: Secondary | ICD-10-CM | POA: Diagnosis present

## 2022-08-19 DIAGNOSIS — Z8249 Family history of ischemic heart disease and other diseases of the circulatory system: Secondary | ICD-10-CM

## 2022-08-19 DIAGNOSIS — E0590 Thyrotoxicosis, unspecified without thyrotoxic crisis or storm: Secondary | ICD-10-CM | POA: Diagnosis present

## 2022-08-19 DIAGNOSIS — E1165 Type 2 diabetes mellitus with hyperglycemia: Secondary | ICD-10-CM | POA: Diagnosis present

## 2022-08-19 DIAGNOSIS — Z981 Arthrodesis status: Secondary | ICD-10-CM

## 2022-08-19 DIAGNOSIS — R29898 Other symptoms and signs involving the musculoskeletal system: Secondary | ICD-10-CM | POA: Diagnosis present

## 2022-08-19 DIAGNOSIS — J4489 Other specified chronic obstructive pulmonary disease: Secondary | ICD-10-CM | POA: Diagnosis present

## 2022-08-19 DIAGNOSIS — Z6834 Body mass index (BMI) 34.0-34.9, adult: Secondary | ICD-10-CM

## 2022-08-19 DIAGNOSIS — T380X5A Adverse effect of glucocorticoids and synthetic analogues, initial encounter: Secondary | ICD-10-CM | POA: Diagnosis present

## 2022-08-19 DIAGNOSIS — Z87891 Personal history of nicotine dependence: Secondary | ICD-10-CM

## 2022-08-19 DIAGNOSIS — E871 Hypo-osmolality and hyponatremia: Secondary | ICD-10-CM | POA: Diagnosis present

## 2022-08-19 DIAGNOSIS — R296 Repeated falls: Secondary | ICD-10-CM | POA: Diagnosis present

## 2022-08-19 DIAGNOSIS — E872 Acidosis, unspecified: Secondary | ICD-10-CM | POA: Diagnosis present

## 2022-08-19 DIAGNOSIS — Z23 Encounter for immunization: Secondary | ICD-10-CM | POA: Diagnosis not present

## 2022-08-19 DIAGNOSIS — E785 Hyperlipidemia, unspecified: Secondary | ICD-10-CM | POA: Diagnosis present

## 2022-08-19 DIAGNOSIS — M25561 Pain in right knee: Secondary | ICD-10-CM

## 2022-08-19 DIAGNOSIS — W19XXXA Unspecified fall, initial encounter: Secondary | ICD-10-CM | POA: Diagnosis present

## 2022-08-19 DIAGNOSIS — Z79899 Other long term (current) drug therapy: Secondary | ICD-10-CM

## 2022-08-19 DIAGNOSIS — K219 Gastro-esophageal reflux disease without esophagitis: Secondary | ICD-10-CM | POA: Diagnosis present

## 2022-08-19 DIAGNOSIS — I10 Essential (primary) hypertension: Secondary | ICD-10-CM | POA: Diagnosis present

## 2022-08-19 DIAGNOSIS — D509 Iron deficiency anemia, unspecified: Secondary | ICD-10-CM | POA: Diagnosis present

## 2022-08-19 DIAGNOSIS — M545 Low back pain, unspecified: Secondary | ICD-10-CM

## 2022-08-19 DIAGNOSIS — M1990 Unspecified osteoarthritis, unspecified site: Secondary | ICD-10-CM | POA: Diagnosis present

## 2022-08-19 DIAGNOSIS — Z7984 Long term (current) use of oral hypoglycemic drugs: Secondary | ICD-10-CM

## 2022-08-19 DIAGNOSIS — Z79890 Hormone replacement therapy (postmenopausal): Secondary | ICD-10-CM

## 2022-08-19 DIAGNOSIS — Z833 Family history of diabetes mellitus: Secondary | ICD-10-CM

## 2022-08-19 DIAGNOSIS — E861 Hypovolemia: Secondary | ICD-10-CM | POA: Diagnosis not present

## 2022-08-19 DIAGNOSIS — D638 Anemia in other chronic diseases classified elsewhere: Secondary | ICD-10-CM | POA: Diagnosis present

## 2022-08-19 DIAGNOSIS — J4540 Moderate persistent asthma, uncomplicated: Secondary | ICD-10-CM | POA: Diagnosis present

## 2022-08-19 DIAGNOSIS — E119 Type 2 diabetes mellitus without complications: Secondary | ICD-10-CM

## 2022-08-19 DIAGNOSIS — Z9181 History of falling: Secondary | ICD-10-CM

## 2022-08-19 DIAGNOSIS — N179 Acute kidney failure, unspecified: Secondary | ICD-10-CM | POA: Diagnosis present

## 2022-08-19 DIAGNOSIS — Z8611 Personal history of tuberculosis: Secondary | ICD-10-CM

## 2022-08-19 DIAGNOSIS — M5416 Radiculopathy, lumbar region: Secondary | ICD-10-CM | POA: Diagnosis present

## 2022-08-19 DIAGNOSIS — Z794 Long term (current) use of insulin: Secondary | ICD-10-CM

## 2022-08-19 DIAGNOSIS — F419 Anxiety disorder, unspecified: Secondary | ICD-10-CM | POA: Diagnosis present

## 2022-08-19 DIAGNOSIS — Z7951 Long term (current) use of inhaled steroids: Secondary | ICD-10-CM

## 2022-08-19 DIAGNOSIS — M48061 Spinal stenosis, lumbar region without neurogenic claudication: Principal | ICD-10-CM | POA: Diagnosis present

## 2022-08-19 DIAGNOSIS — M5126 Other intervertebral disc displacement, lumbar region: Secondary | ICD-10-CM | POA: Diagnosis present

## 2022-08-19 DIAGNOSIS — Y900 Blood alcohol level of less than 20 mg/100 ml: Secondary | ICD-10-CM | POA: Diagnosis present

## 2022-08-19 DIAGNOSIS — M5431 Sciatica, right side: Secondary | ICD-10-CM | POA: Diagnosis present

## 2022-08-19 DIAGNOSIS — E875 Hyperkalemia: Secondary | ICD-10-CM | POA: Diagnosis present

## 2022-08-19 MED ORDER — TRIAMCINOLONE ACETONIDE 40 MG/ML IJ SUSP
40.0000 mg | Freq: Once | INTRAMUSCULAR | Status: AC
  Administered 2022-08-19: 40 mg via INTRAMUSCULAR

## 2022-08-19 MED ORDER — KETOROLAC TROMETHAMINE 60 MG/2ML IM SOLN
60.0000 mg | Freq: Once | INTRAMUSCULAR | Status: AC
  Administered 2022-08-19: 60 mg via INTRAMUSCULAR

## 2022-08-19 NOTE — Progress Notes (Signed)
Pt presents for right hip pain -states that she is unable to walk without walker or wheelchair
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
## 2022-08-19 NOTE — ED Notes (Signed)
Charge RN was called out to lobby by family. Family concerned d/t long wait time. Discussed with family patients are roomed by acuity, when concerns for abnormal lab work or imaging. Informed pt is currently our longest wait and will be roomed as soon as possible. Family understands. Pt denies any additional needs at this time.

## 2022-08-19 NOTE — ED Notes (Addendum)
Per patient and family, legs have been getting weaker, has had episodes in the past  Denies any new pain or injury from recent fall, no loc

## 2022-08-19 NOTE — ED Triage Notes (Signed)
[name] from her sisters house.  Pt has fallen x3 today, [facility] and EMS ahs been to her house to assist with lifts 3 times today.  Pt reports she begins to get weak in both legs and then assists herself down to the ground. Pt reports her symptoms started 4 days ago after moving herself from one apartment to another apartment. Pt having reports pain shooting from her Right side down into her Right leg. Pt was seen at her PCP seen today and given a cortisone injection with no relief    BP 180/76 HR 100 97% RA

## 2022-08-20 ENCOUNTER — Observation Stay (HOSPITAL_COMMUNITY): Payer: Medicaid Other

## 2022-08-20 ENCOUNTER — Emergency Department (HOSPITAL_COMMUNITY): Payer: Medicaid Other

## 2022-08-20 ENCOUNTER — Emergency Department (HOSPITAL_BASED_OUTPATIENT_CLINIC_OR_DEPARTMENT_OTHER): Payer: Medicaid Other

## 2022-08-20 ENCOUNTER — Emergency Department (HOSPITAL_BASED_OUTPATIENT_CLINIC_OR_DEPARTMENT_OTHER): Payer: Medicaid Other | Admitting: Radiology

## 2022-08-20 DIAGNOSIS — E1165 Type 2 diabetes mellitus with hyperglycemia: Secondary | ICD-10-CM | POA: Diagnosis not present

## 2022-08-20 DIAGNOSIS — J4540 Moderate persistent asthma, uncomplicated: Secondary | ICD-10-CM | POA: Diagnosis not present

## 2022-08-20 DIAGNOSIS — I10 Essential (primary) hypertension: Secondary | ICD-10-CM

## 2022-08-20 DIAGNOSIS — D509 Iron deficiency anemia, unspecified: Secondary | ICD-10-CM | POA: Diagnosis not present

## 2022-08-20 DIAGNOSIS — Z79899 Other long term (current) drug therapy: Secondary | ICD-10-CM | POA: Diagnosis not present

## 2022-08-20 DIAGNOSIS — R296 Repeated falls: Secondary | ICD-10-CM

## 2022-08-20 DIAGNOSIS — E875 Hyperkalemia: Secondary | ICD-10-CM | POA: Diagnosis not present

## 2022-08-20 DIAGNOSIS — N1832 Chronic kidney disease, stage 3b: Secondary | ICD-10-CM

## 2022-08-20 DIAGNOSIS — F1910 Other psychoactive substance abuse, uncomplicated: Secondary | ICD-10-CM

## 2022-08-20 DIAGNOSIS — K219 Gastro-esophageal reflux disease without esophagitis: Secondary | ICD-10-CM | POA: Diagnosis not present

## 2022-08-20 DIAGNOSIS — R29898 Other symptoms and signs involving the musculoskeletal system: Secondary | ICD-10-CM | POA: Diagnosis present

## 2022-08-20 DIAGNOSIS — Z87891 Personal history of nicotine dependence: Secondary | ICD-10-CM | POA: Diagnosis not present

## 2022-08-20 DIAGNOSIS — E1122 Type 2 diabetes mellitus with diabetic chronic kidney disease: Secondary | ICD-10-CM

## 2022-08-20 DIAGNOSIS — E871 Hypo-osmolality and hyponatremia: Secondary | ICD-10-CM

## 2022-08-20 DIAGNOSIS — M48061 Spinal stenosis, lumbar region without neurogenic claudication: Secondary | ICD-10-CM | POA: Diagnosis present

## 2022-08-20 DIAGNOSIS — E0590 Thyrotoxicosis, unspecified without thyrotoxic crisis or storm: Secondary | ICD-10-CM | POA: Diagnosis not present

## 2022-08-20 DIAGNOSIS — D638 Anemia in other chronic diseases classified elsewhere: Secondary | ICD-10-CM | POA: Diagnosis not present

## 2022-08-20 DIAGNOSIS — Y900 Blood alcohol level of less than 20 mg/100 ml: Secondary | ICD-10-CM | POA: Diagnosis not present

## 2022-08-20 DIAGNOSIS — W19XXXA Unspecified fall, initial encounter: Secondary | ICD-10-CM | POA: Diagnosis present

## 2022-08-20 DIAGNOSIS — T380X5A Adverse effect of glucocorticoids and synthetic analogues, initial encounter: Secondary | ICD-10-CM | POA: Diagnosis not present

## 2022-08-20 DIAGNOSIS — N179 Acute kidney failure, unspecified: Secondary | ICD-10-CM

## 2022-08-20 DIAGNOSIS — M5431 Sciatica, right side: Secondary | ICD-10-CM | POA: Diagnosis not present

## 2022-08-20 DIAGNOSIS — F32A Depression, unspecified: Secondary | ICD-10-CM | POA: Diagnosis not present

## 2022-08-20 DIAGNOSIS — E872 Acidosis, unspecified: Secondary | ICD-10-CM | POA: Diagnosis not present

## 2022-08-20 DIAGNOSIS — Z6834 Body mass index (BMI) 34.0-34.9, adult: Secondary | ICD-10-CM | POA: Diagnosis not present

## 2022-08-20 DIAGNOSIS — M5416 Radiculopathy, lumbar region: Secondary | ICD-10-CM | POA: Diagnosis not present

## 2022-08-20 DIAGNOSIS — E861 Hypovolemia: Secondary | ICD-10-CM | POA: Diagnosis not present

## 2022-08-20 DIAGNOSIS — E669 Obesity, unspecified: Secondary | ICD-10-CM | POA: Diagnosis not present

## 2022-08-20 DIAGNOSIS — E785 Hyperlipidemia, unspecified: Secondary | ICD-10-CM | POA: Diagnosis not present

## 2022-08-20 DIAGNOSIS — Z794 Long term (current) use of insulin: Secondary | ICD-10-CM | POA: Diagnosis not present

## 2022-08-20 LAB — URINALYSIS, ROUTINE W REFLEX MICROSCOPIC
Bilirubin Urine: NEGATIVE
Glucose, UA: >=500 mg/dL — AB
Hgb urine dipstick: NEGATIVE
Ketones, ur: NEGATIVE mg/dL
Nitrite: NEGATIVE
Protein, ur: NEGATIVE mg/dL
Specific Gravity, Urine: 1.003 — ABNORMAL LOW (ref 1.005–1.030)
pH: 6 (ref 5.0–8.0)

## 2022-08-20 LAB — COMPREHENSIVE METABOLIC PANEL
ALT: 18 U/L (ref 0–44)
AST: 22 U/L (ref 15–41)
Albumin: 4 g/dL (ref 3.5–5.0)
Alkaline Phosphatase: 143 U/L — ABNORMAL HIGH (ref 38–126)
Anion gap: 9 (ref 5–15)
BUN: 28 mg/dL — ABNORMAL HIGH (ref 8–23)
CO2: 20 mmol/L — ABNORMAL LOW (ref 22–32)
Calcium: 9.1 mg/dL (ref 8.9–10.3)
Chloride: 104 mmol/L (ref 98–111)
Creatinine, Ser: 1.35 mg/dL — ABNORMAL HIGH (ref 0.44–1.00)
GFR, Estimated: 44 mL/min — ABNORMAL LOW (ref >60)
Glucose, Bld: 179 mg/dL — ABNORMAL HIGH (ref 70–99)
Potassium: 4.6 mmol/L (ref 3.5–5.1)
Sodium: 133 mmol/L — ABNORMAL LOW (ref 135–145)
Total Bilirubin: 0.3 mg/dL (ref 0.3–1.2)
Total Protein: 7 g/dL (ref 6.5–8.1)

## 2022-08-20 LAB — CBC
HCT: 31.7 % — ABNORMAL LOW (ref 36.0–46.0)
Hemoglobin: 10.5 g/dL — ABNORMAL LOW (ref 12.0–15.0)
MCH: 23.8 pg — ABNORMAL LOW (ref 26.0–34.0)
MCHC: 33.1 g/dL (ref 30.0–36.0)
MCV: 71.9 fL — ABNORMAL LOW (ref 80.0–100.0)
Platelets: 194 10*3/uL (ref 150–400)
RBC: 4.41 MIL/uL (ref 3.87–5.11)
RDW: 15.5 % (ref 11.5–15.5)
WBC: 7.4 10*3/uL (ref 4.0–10.5)
nRBC: 0 % (ref 0.0–0.2)

## 2022-08-20 LAB — DIFFERENTIAL
Abs Immature Granulocytes: 0.03 10*3/uL (ref 0.00–0.07)
Basophils Absolute: 0 10*3/uL (ref 0.0–0.1)
Basophils Relative: 0 %
Eosinophils Absolute: 0.2 10*3/uL (ref 0.0–0.5)
Eosinophils Relative: 3 %
Immature Granulocytes: 0 %
Lymphocytes Relative: 35 %
Lymphs Abs: 2.6 10*3/uL (ref 0.7–4.0)
Monocytes Absolute: 0.4 10*3/uL (ref 0.1–1.0)
Monocytes Relative: 6 %
Neutro Abs: 4 10*3/uL (ref 1.7–7.7)
Neutrophils Relative %: 56 %

## 2022-08-20 LAB — RAPID URINE DRUG SCREEN, HOSP PERFORMED
Amphetamines: NOT DETECTED
Barbiturates: NOT DETECTED
Benzodiazepines: NOT DETECTED
Cocaine: NOT DETECTED
Opiates: POSITIVE — AB
Tetrahydrocannabinol: NOT DETECTED

## 2022-08-20 LAB — PROTIME-INR
INR: 1 (ref 0.8–1.2)
Prothrombin Time: 12.6 seconds (ref 11.4–15.2)

## 2022-08-20 LAB — ETHANOL: Alcohol, Ethyl (B): 14 mg/dL — ABNORMAL HIGH (ref <10)

## 2022-08-20 LAB — T4, FREE: Free T4: 1.23 ng/dL — ABNORMAL HIGH (ref 0.61–1.12)

## 2022-08-20 LAB — APTT: aPTT: 28 seconds (ref 24–36)

## 2022-08-20 LAB — HEMOGLOBIN A1C
Hgb A1c MFr Bld: 7.4 % — ABNORMAL HIGH (ref 4.8–5.6)
Mean Plasma Glucose: 165.68 mg/dL

## 2022-08-20 LAB — HIV ANTIBODY (ROUTINE TESTING W REFLEX): HIV Screen 4th Generation wRfx: NONREACTIVE

## 2022-08-20 LAB — CBG MONITORING, ED: Glucose-Capillary: 256 mg/dL — ABNORMAL HIGH (ref 70–99)

## 2022-08-20 LAB — CK: Total CK: 410 U/L — ABNORMAL HIGH (ref 38–234)

## 2022-08-20 MED ORDER — DEXAMETHASONE SODIUM PHOSPHATE 4 MG/ML IJ SOLN
INTRAMUSCULAR | Status: AC
  Administered 2022-08-20: 4 mg via INTRAVENOUS
  Filled 2022-08-20: qty 1

## 2022-08-20 MED ORDER — OXYCODONE HCL 5 MG PO TABS
5.0000 mg | ORAL_TABLET | ORAL | Status: DC | PRN
  Administered 2022-08-20 – 2022-08-21 (×2): 5 mg via ORAL
  Filled 2022-08-20 (×2): qty 1

## 2022-08-20 MED ORDER — INSULIN DETEMIR 100 UNIT/ML ~~LOC~~ SOLN
25.0000 [IU] | Freq: Every day | SUBCUTANEOUS | Status: DC
  Administered 2022-08-20: 25 [IU] via SUBCUTANEOUS
  Filled 2022-08-20 (×2): qty 0.25

## 2022-08-20 MED ORDER — MORPHINE SULFATE (PF) 4 MG/ML IV SOLN
4.0000 mg | Freq: Once | INTRAVENOUS | Status: AC | PRN
  Administered 2022-08-20: 4 mg via INTRAVENOUS
  Filled 2022-08-20: qty 1

## 2022-08-20 MED ORDER — INSULIN ASPART 100 UNIT/ML IJ SOLN
0.0000 [IU] | Freq: Three times a day (TID) | INTRAMUSCULAR | Status: DC
  Administered 2022-08-20: 11 [IU] via SUBCUTANEOUS
  Administered 2022-08-21: 7 [IU] via SUBCUTANEOUS
  Administered 2022-08-21: 20 [IU] via SUBCUTANEOUS
  Administered 2022-08-21: 4 [IU] via SUBCUTANEOUS
  Administered 2022-08-22: 15 [IU] via SUBCUTANEOUS

## 2022-08-20 MED ORDER — ENOXAPARIN SODIUM 40 MG/0.4ML IJ SOSY
40.0000 mg | PREFILLED_SYRINGE | INTRAMUSCULAR | Status: DC
  Administered 2022-08-21: 40 mg via SUBCUTANEOUS
  Filled 2022-08-20: qty 0.4

## 2022-08-20 MED ORDER — MORPHINE SULFATE (PF) 4 MG/ML IV SOLN
4.0000 mg | Freq: Once | INTRAVENOUS | Status: AC
  Administered 2022-08-20: 4 mg via INTRAVENOUS
  Filled 2022-08-20: qty 1

## 2022-08-20 MED ORDER — ALBUTEROL SULFATE (2.5 MG/3ML) 0.083% IN NEBU: 2.5000 mg | INHALATION_SOLUTION | Freq: Four times a day (QID) | RESPIRATORY_TRACT | Status: DC | PRN

## 2022-08-20 MED ORDER — METHIMAZOLE 5 MG PO TABS
5.0000 mg | ORAL_TABLET | Freq: Every day | ORAL | Status: DC
  Administered 2022-08-20 – 2022-08-24 (×4): 5 mg via ORAL
  Filled 2022-08-20 (×6): qty 1

## 2022-08-20 MED ORDER — BUPROPION HCL ER (XL) 150 MG PO TB24
300.0000 mg | ORAL_TABLET | Freq: Every day | ORAL | Status: DC
  Administered 2022-08-20 – 2022-08-24 (×4): 300 mg via ORAL
  Filled 2022-08-20 (×2): qty 2
  Filled 2022-08-20: qty 1
  Filled 2022-08-20: qty 2

## 2022-08-20 MED ORDER — HYDRALAZINE HCL 20 MG/ML IJ SOLN
10.0000 mg | INTRAMUSCULAR | Status: DC | PRN
  Filled 2022-08-20: qty 1

## 2022-08-20 MED ORDER — DEXAMETHASONE SODIUM PHOSPHATE 4 MG/ML IJ SOLN
4.0000 mg | Freq: Four times a day (QID) | INTRAMUSCULAR | Status: DC
  Administered 2022-08-21 – 2022-08-22 (×7): 4 mg via INTRAVENOUS
  Filled 2022-08-20 (×10): qty 1

## 2022-08-20 MED ORDER — ACETAMINOPHEN 325 MG PO TABS: 650.0000 mg | ORAL_TABLET | Freq: Four times a day (QID) | ORAL | Status: DC | PRN

## 2022-08-20 MED ORDER — DEXAMETHASONE SODIUM PHOSPHATE 10 MG/ML IJ SOLN
10.0000 mg | Freq: Once | INTRAMUSCULAR | Status: AC
  Administered 2022-08-20: 10 mg via INTRAVENOUS
  Filled 2022-08-20: qty 1

## 2022-08-20 MED ORDER — FERROUS SULFATE 325 (65 FE) MG PO TABS
325.0000 mg | ORAL_TABLET | Freq: Every day | ORAL | Status: DC
  Administered 2022-08-20 – 2022-08-24 (×4): 325 mg via ORAL
  Filled 2022-08-20 (×4): qty 1

## 2022-08-20 MED ORDER — ARIPIPRAZOLE 10 MG PO TABS
10.0000 mg | ORAL_TABLET | Freq: Every day | ORAL | Status: DC
  Administered 2022-08-20 – 2022-08-24 (×4): 10 mg via ORAL
  Filled 2022-08-20 (×4): qty 1

## 2022-08-20 MED ORDER — HYDROXYZINE HCL 25 MG PO TABS
25.0000 mg | ORAL_TABLET | Freq: Three times a day (TID) | ORAL | Status: DC | PRN
  Administered 2022-08-20 – 2022-08-23 (×2): 25 mg via ORAL
  Filled 2022-08-20 (×2): qty 1

## 2022-08-20 MED ORDER — TRAMADOL HCL 50 MG PO TABS
50.0000 mg | ORAL_TABLET | Freq: Once | ORAL | Status: AC
  Administered 2022-08-20: 50 mg via ORAL
  Filled 2022-08-20: qty 1

## 2022-08-20 MED ORDER — ZOLPIDEM TARTRATE 5 MG PO TABS
5.0000 mg | ORAL_TABLET | Freq: Every day | ORAL | Status: DC
Start: 2022-08-20 — End: 2022-08-24
  Administered 2022-08-20 – 2022-08-23 (×4): 5 mg via ORAL
  Filled 2022-08-20 (×4): qty 1

## 2022-08-20 MED ORDER — FLUTICASONE PROPIONATE 50 MCG/ACT NA SUSP: 2.0000 | NASAL | Status: DC | PRN

## 2022-08-20 MED ORDER — ATORVASTATIN CALCIUM 10 MG PO TABS
10.0000 mg | ORAL_TABLET | Freq: Every day | ORAL | Status: DC
  Administered 2022-08-20 – 2022-08-24 (×4): 10 mg via ORAL
  Filled 2022-08-20 (×4): qty 1

## 2022-08-20 MED ORDER — TRAZODONE HCL 50 MG PO TABS
150.0000 mg | ORAL_TABLET | Freq: Every day | ORAL | Status: DC
  Administered 2022-08-20 – 2022-08-23 (×4): 150 mg via ORAL
  Filled 2022-08-20 (×4): qty 3

## 2022-08-20 MED ORDER — PANTOPRAZOLE SODIUM 40 MG PO TBEC
40.0000 mg | DELAYED_RELEASE_TABLET | Freq: Every day | ORAL | Status: DC
  Administered 2022-08-20 – 2022-08-24 (×4): 40 mg via ORAL
  Filled 2022-08-20 (×3): qty 1

## 2022-08-20 MED ORDER — ACETAMINOPHEN 650 MG RE SUPP: 650.0000 mg | Freq: Four times a day (QID) | RECTAL | Status: DC | PRN

## 2022-08-20 NOTE — ED Notes (Signed)
Patient is resting comfortably. 

## 2022-08-20 NOTE — H&P (Signed)
History and Physical    Patient: Kara Kelley LYY:[id number] DOB: [birth year] DOA: 08/19/2022 DOS: the patient was seen and examined on 08/20/2022 PCP: Camillia Herter, NP  Patient coming from: Home  Chief Complaint:  Chief Complaint  Patient presents with   Fall   HPI: Kara Kelley is a 66 y.o. female with medical history significant of hypertension, hyperlipidemia, moderate persistent asthma, DM type II, hyperthyroidism, GERD, and polysubstance abuse   who presents with complaints of leg weakness after having repeated falls.  Patient reports symptoms started about a month ago when she started having issues with her right hip.  Normally she gets around with use of a cane and lives alone.  Last 2 days she had been trying to use a walker to get around due to worsening lower extremity weakness.  However, yesterday had had 3 falls and was unable to stand even with use of the walker.  She has had prior C4-C7 ACDF in January 2023 by Dr. Trenton Gammon and prior L4-S1 decompression and fusion possibly back in 2018.  She reports having lower back pain with radiation down her right leg more so than her left.  Patient reports that it feels like her legs just give out which causes her to fall.  She states that she has had urinary frequency. Denies having any saddle anesthesia, incontinence, fever, chills, or dysuria symptoms.  Patient reports that she will be 4 years sober alcohol, tobacco, and drugs on October 13, 2022.   In the emergency department patient was noted to be tachypneic and tachycardic, but all other vital signs relatively maintained.  Labs noted sodium 133, BUN 28, creatinine 1.35, alkaline phosphatase 143, glucose 179, and ethanol level 14.  Urinalysis noted glucose, trace leukocytes, rare bacteria, and no other significant signs for infection.  CT scan of the head and lumbar spine did not note any acute abnormality.  MRI of the brain was negative for stroke.  MRI of the lumbar spine noted  decompression and fusion changes at L4-L5 and L5-S1 with no adverse features, and progressed severe segment disease at L3 with new or increased moderate sized broad-based leftward disc protrusion contributing to moderate to severe spinal stenosis, severe left foraminal and severe left lateral recess stenosis, and bladder distention with over 525 mL of urine present.  Neurosurgery had been consulted recommending steroids, PT, and pain control.  Patient has been given Decadron 10 g IV and pain medications.    Review of Systems: As mentioned in the history of present illness. All other systems reviewed and are negative. Past Medical History:  Diagnosis Date   Anemia    Anxiety    Arthritis    Asthma    Bunion    Callus    Chronic pain    Cocaine abuse ([facility] [place])    COPD (chronic obstructive pulmonary disease) (HCC)    Corns and callosities    Degenerative joint disease    Depression    Diabetes mellitus    Endometrial polyp    ETOH abuse    Gall stones    GERD (gastroesophageal reflux disease)    Headache    history of Migraines   Hepatitis C    Hep C   Hyperlipidemia    Hypertension    Insomnia    Spondylolisthesis of lumbar region    Substance abuse ([place])    alcoholism   Tuberculosis 1985   Wears dentures    Wears glasses    Past Surgical History:  Procedure  Laterality Date   ANTERIOR CERVICAL DECOMP/DISCECTOMY FUSION N/A 10/26/2021   Procedure: ACDF - C4-C5 - C5-C6 - C6-C7;  Surgeon: Earnie Larsson, MD;  Location: [facility];  Service: Neurosurgery;  Laterality: N/A;   BACK SURGERY  2018   CESAREAN SECTION     x3   CHOLECYSTECTOMY N/A 08/11/2016   Procedure: LAPAROSCOPIC CHOLECYSTECTOMY WITH   INTRAOPERATIVE CHOLANGIOGRAM;  Surgeon: Stark Klein, MD;  Location: [facility];  Service: General;  Laterality: N/A;   COLONOSCOPY     Cotton Osteotomy w/ Graft Left 06/18/2009   Excision of Benign Lesion Right 01/30/2013   Rt Plantar   FOOT SURGERY     HAMMER TOE REPAIR Right 02/12/2016   RIGHT  #5   Hammertoe Repair Left 06/18/2009   Lt #5   HYSTEROSCOPY N/A 06/20/2017   Procedure: DILATION AND CURETTAGE, HYSTEROSCOPY w/ Polypectomy;  Surgeon: Lavonia Drafts, MD;  Location: [facility];  Service: Gynecology;  Laterality: N/A;   LUMBAR FUSION  2018   METATARSAL OSTEOTOMY Left 06/18/2009   #5   MULTIPLE TOOTH EXTRACTIONS     Nail Matrixectomy Left 06/18/2009   LT #1   OSTEOTOMY Right 01/30/2013   Rt #5   Phalangectomy Left 06/18/2009   LT #1   Phalangectomy Right 01/30/2013   Rt #1   TUBAL LIGATION     Social History:  reports that she quit smoking about 3 years ago. Her smoking use included cigarettes. She has a 35.25 pack-year smoking history. She has been exposed to tobacco smoke. She has never used smokeless tobacco. She reports that she does not drink alcohol and does not use drugs.  No Known Allergies  Family History  Problem Relation Age of Onset   Heart disease Mother    Diabetes Other        mat great aunt   Cirrhosis Other        mat great aunt   Cirrhosis Other        mat great uncles x 2   Colon cancer Neg Hx    Breast cancer Neg Hx     Prior to Admission medications   Medication Sig Start Date End Date Taking? Authorizing Provider  Accu-Chek FastClix Lancets MISC Use as directed to check blood sugars 2 times per day dx: e11.65 07/13/21   [name], FNP  Accu-Chek Softclix Lancets lancets Use as instructed to check blood sugars twice daily E11.69 07/20/21   [name], FNP  albuterol (VENTOLIN HFA) 108 (90 Base) MCG/ACT inhaler Inhale 2 puffs into the lungs every 6 (six) hours as needed for wheezing or shortness of breath. 06/29/21   [name], FNP  ARIPiprazole (ABILIFY) 10 MG tablet Take 10 mg by mouth daily.  03/26/19   [provider]  atorvastatin (LIPITOR) 10 MG tablet Take 1 tablet (10 mg total) by mouth daily. 12/29/21 06/27/22  Camillia Herter, NP  Blood Glucose Monitoring Suppl (ACCU-CHEK GUIDE ME) w/Device KIT Use to check blood  sugars twice daily E11.69 07/20/21   [name], FNP  buPROPion (WELLBUTRIN XL) 300 MG 24 hr tablet Take 1 tablet (300 mg total) by mouth daily. 10/12/21   [name], FNP  cyclobenzaprine (FLEXERIL) 5 MG tablet Take 1 tablet (5 mg total) by mouth 3 (three) times daily as needed for muscle spasms. 02/25/22   Camillia Herter, NP  fluticasone (FLONASE) 50 MCG/ACT nasal spray Place 1 spray into both nostrils daily. 02/23/22   Freddi Starr, MD  fluticasone-salmeterol (ADVAIR DISKUS) 250-50 MCG/ACT AEPB  Inhale 1 puff into the lungs in the morning and at bedtime. 06/06/22   Freddi Starr, MD  Glucagon (GVOKE HYPOPEN 2-PACK) 1 MG/0.2ML SOAJ Inject 1 each into the skin as needed. If blood sugar less than 60 11/26/20   [name], FNP  glucose blood (ACCU-CHEK GUIDE) test strip Use as directed to check blood sugars 2 times per day dx: e11.69 07/20/21   [name], FNP  hydrOXYzine (ATARAX) 25 MG tablet Take 1 tablet (25 mg total) by mouth every 8 (eight) hours as needed for anxiety. 10/12/21   [name], FNP  insulin detemir (LEVEMIR FLEXTOUCH) 100 UNIT/ML FlexPen Inject 32 Units into the skin at bedtime. 12/28/21   Camillia Herter, NP  Iron, Ferrous Sulfate, 325 (65 Fe) MG TABS Take 325 mg by mouth daily. 05/14/22   Camillia Herter, NP  LINZESS 145 MCG CAPS capsule Take 145 mcg by mouth daily before breakfast.  06/25/19   [provider]  lisinopril (ZESTRIL) 10 MG tablet TAKE 1 TABLET(10 MG) BY MOUTH DAILY 12/28/21   Camillia Herter, NP  metFORMIN (GLUCOPHAGE-XR) 500 MG 24 hr tablet TAKE 1 TABLET(500 MG) BY MOUTH DAILY WITH BREAKFAST 04/15/22   Camillia Herter, NP  methimazole (TAPAZOLE) 5 MG tablet Take 1 tablet (5 mg total) by mouth daily. 01/04/22   Shamleffer, Melanie Crazier, MD  miconazole (MONISTAT 7 SIMPLY CURE) 2 % vaginal cream Place 1 Applicatorful vaginally at bedtime. 06/07/22   Apolonio Schneiders, FNP  Multiple Vitamins-Minerals (MULTIVITAMIN WITH MINERALS) tablet Take 1 tablet by  mouth daily. 50 +    [provider]  NOVOLOG FLEXPEN 100 UNIT/ML FlexPen Inject 18 Units into the skin 3 (three) times daily with meals. 01/27/21   [name], FNP  omeprazole (PRILOSEC) 40 MG capsule TAKE 1 CAPSULE(40 MG) BY MOUTH DAILY BEFORE BREAKFAST 05/13/22   Camillia Herter, NP  oxyCODONE-acetaminophen (PERCOCET) 5-325 MG tablet Take 1 tablet by mouth every 4 (four) hours as needed. 05/25/22   Trula Slade, DPM  promethazine (PHENERGAN) 25 MG tablet Take 1 tablet (25 mg total) by mouth every 8 (eight) hours as needed for nausea or vomiting. 05/25/22   Trula Slade, DPM  Semaglutide, 1 MG/DOSE, (OZEMPIC, 1 MG/DOSE,) 4 MG/3ML SOPN Inject 1 mg into the skin once a week. 05/27/22   Camillia Herter, NP  spironolactone (ALDACTONE) 50 MG tablet TAKE 1 TABLET(50 MG) BY MOUTH DAILY 12/28/21   Camillia Herter, NP  traZODone (DESYREL) 150 MG tablet Take 150 mg by mouth at bedtime.  07/16/19   [provider]    Physical Exam: Vitals:   08/20/22 0230 08/20/22 0300 08/20/22 0410 08/20/22 0920  BP: 134/63 138/63 135/74 136/86  Pulse: 91  94 (!) 103  Resp: (!) 26 (!) _0 Temp:   98.2 F (36.8 C) 98.1 F (36.7 C)  TempSrc:    Oral  SpO2: 96%  100% 98%   Exam  Constitutional: Obese female currently in no acute distress Eyes: PERRL, lids and conjunctivae normal ENMT: Mucous membranes are moist.  Neck: normal, supple,   Respiratory: clear to auscultation bilaterally, no wheezing, no crackles. Normal respiratory effort. No accessory muscle use.  Cardiovascular: Regular rate and rhythm, no murmurs / rubs / gallops. No extremity edema.  Abdomen: no tenderness, no masses palpated. No hepatosplenomegaly. Bowel sounds positive.  Musculoskeletal: no clubbing / cyanosis.  No joint deformity appreciated. Skin: no rashes, lesions, ulcers. No induration Neurologic: CN 2-12 grossly intact.  Sensation intact.  Strength 5/5 in both upper extremities.  Strength 3/5 in the  bilateral lower extremities. Psychiatric: Normal judgment and insight. Alert and oriented x 3. Normal mood.   Data Reviewed:  EKG noted sinus rhythm at 89 bpm.  Reviewed labs, imaging and pertinent records as noted above in HPI.  Assessment and Plan: Frequent falls secondary to leg weakness L3-L4 severe stenosis Patient presented after having multiple falls at home with reports of leg weakness.  No concern for stroke on MRI of the brain.  MRI of the lumbar spine noted new moderate-sized and broad-based leftward disc protrusion contributing to moderate to severe spinal stenosis, severe left foraminal and severe left lateral recess stenosis which a question in L3/L4 radiculitis.  Patient reports pain down the right leg more so than the left leg which is inconsistent with imaging. -Admit to a medical telemetry bed -Oxycodone as needed for pain -Steroids per neurosurgery recommendations -MRI of the thoracic spine per neurosurgery recommendations -PT/OT to evaluate and treat -Appreciate neurosurgery consultative services, will follow-up for any further recommendations  Acute kidney injury Patient presents with creatinine of 1.35 with BUN 28.  Baseline creatinine previously had been 0.8-1.  MRI noted bladder distention with over 525 mL of urine present.  Urinalysis did not show significant signs of infection and urinary frequency may be more likely related to uncontrolled diabetes.. -Check CK with reports of multiple falls -Check bladder scans every 8 hours to ensure no signs of urinary retention -Avoid nephrotoxic agents -Normal saline IV fluids  Hyponatremia Acute.  Sodium was 133.  Possibly hypovolemic hyponatremia. -Continue to monitor  Diabetes mellitus type 2 uncontrolled Home medication regimen includes Levemir 25 units nightly and NovoLog 15 units 3 times daily for elevated blood sugars, and semaglutide1 mg q. weekly. -Hypoglycemic protocols -Continue Levemir -CBGs before every  meal with resistant SSI -Adjust regimen as needed  Essential hypertension Home blood pressure regimen includes lisinopril 10 mg daily and spironolactone 50 mg daily. -Hold lisinopril and spironolactone due to AKI -Hydralazine IV  Microcytic hypochromic anemia Chronic.  Hemoglobin 10.5 which appears lower than prior.  Iron studies were noted to have elevated ferritin levels with iron levels within normal limits in 12/2021. -Continue iron supplement -Recheck CBC tomorrow morning  Hyperthyroidism -Add on free T4 -Continue methimazole  GERD -Continue pharmacy substitution of Protonix for omeprazole  History of polysubstance abuse Patient reports that she will be 4 years in remission from alcohol, tobacco, and illicit drug use in January.  UDS was positive for opiates, but she had received these while in the emergency department.  Alcohol level was noted to be 14, but patient denies any use.  Unclear if this could be falsely elevated.  Obesity BMI 34.34 kg/m2    Advance Care Planning:   Code Status: Full Code   Consults: Neurosurgery  Family Communication: None  Severity of Illness: The appropriate patient status for this patient is OBSERVATION. Observation status is judged to be reasonable and necessary in order to provide the required intensity of service to ensure the patient's safety. The patient's presenting symptoms, physical exam findings, and initial radiographic and laboratory data in the context of their medical condition is felt to place them at decreased risk for further clinical deterioration. Furthermore, it is anticipated that the patient will be medically stable for discharge from the hospital within 2 midnights of admission.   Author: Norval Morton, MD 08/20/2022 10:19 AM  For on call review [email or web address].

## 2022-08-20 NOTE — ED Provider Notes (Signed)
[facility] DEPT Provider Note   CSN: [id number] Arrival date & time: 08/19/22  1711     History  Chief Complaint  Patient presents with   Lytle Michaels    Kara Kelley is a 66 y.o. female.  HPI     This is a 66 year old female who presents with leg weakness.  Patient has had 3 falls today.  EMS reports that they were called out to the house 3 times to assist in lifting the patient.  Recently the patient states that she has been having pain in her back that radiates down her right leg.  She states that it has made her right leg weak and "my legs just give out."  She states that now she feels weak in both of her legs.  She has not had any arm weakness or speech difficulty.  She furthermore has not had any saddle anesthesia or urinary retention.  When asked if she feel that her leg is giving out secondary to pain or weakness, she cannot differentiate the 2.  She is concerned she may have had a stroke.  Family states that she seems to be much weaker than previous.  Home Medications Prior to Admission medications   Medication Sig Start Date End Date Taking? Authorizing Provider  Accu-Chek FastClix Lancets MISC Use as directed to check blood sugars 2 times per day dx: e11.65 07/13/21   [name], FNP  Accu-Chek Softclix Lancets lancets Use as instructed to check blood sugars twice daily E11.69 07/20/21   [name], FNP  albuterol (VENTOLIN HFA) 108 (90 Base) MCG/ACT inhaler Inhale 2 puffs into the lungs every 6 (six) hours as needed for wheezing or shortness of breath. 06/29/21   [name], FNP  ARIPiprazole (ABILIFY) 10 MG tablet Take 10 mg by mouth daily.  03/26/19   [provider]  atorvastatin (LIPITOR) 10 MG tablet Take 1 tablet (10 mg total) by mouth daily. 12/29/21 06/27/22  Camillia Herter, NP  Blood Glucose Monitoring Suppl (ACCU-CHEK GUIDE ME) w/Device KIT Use to check blood sugars twice daily E11.69 07/20/21   [name], FNP  Blood  Glucose Monitoring Suppl (ACCU-CHEK NANO SMARTVIEW) w/Device KIT Use as directed to check blood sugars 2 times per day dx: e11.65 07/13/21   [name], FNP  buPROPion (WELLBUTRIN XL) 300 MG 24 hr tablet Take 1 tablet (300 mg total) by mouth daily. 10/12/21   [name], FNP  cyclobenzaprine (FLEXERIL) 5 MG tablet Take 1 tablet (5 mg total) by mouth 3 (three) times daily as needed for muscle spasms. 02/25/22   Camillia Herter, NP  fluticasone (FLONASE) 50 MCG/ACT nasal spray Place 1 spray into both nostrils daily. 02/23/22   Freddi Starr, MD  fluticasone-salmeterol (ADVAIR DISKUS) 250-50 MCG/ACT AEPB Inhale 1 puff into the lungs in the morning and at bedtime. 06/06/22   Freddi Starr, MD  Glucagon (GVOKE HYPOPEN 2-PACK) 1 MG/0.2ML SOAJ Inject 1 each into the skin as needed. If blood sugar less than 60 11/26/20   [name], FNP  glucose blood (ACCU-CHEK GUIDE) test strip Use as directed to check blood sugars 2 times per day dx: e11.69 07/20/21   [name], FNP  hydrOXYzine (ATARAX) 25 MG tablet Take 1 tablet (25 mg total) by mouth every 8 (eight) hours as needed for anxiety. 10/12/21   [name], FNP  insulin detemir (LEVEMIR FLEXTOUCH) 100 UNIT/ML FlexPen Inject 32 Units into the skin at bedtime. 12/28/21   Camillia Herter, NP  Insulin Pen Needle (BD PEN NEEDLE NANO 2ND GEN) 32G X 4 MM MISC Inject 1 each into the skin daily. as directed 12/28/21   Camillia Herter, NP  Iron, Ferrous Sulfate, 325 (65 Fe) MG TABS Take 325 mg by mouth daily. 05/14/22   Camillia Herter, NP  LINZESS 145 MCG CAPS capsule Take 145 mcg by mouth daily before breakfast.  06/25/19   [provider]  lisinopril (ZESTRIL) 10 MG tablet TAKE 1 TABLET(10 MG) BY MOUTH DAILY 12/28/21   Camillia Herter, NP  metFORMIN (GLUCOPHAGE-XR) 500 MG 24 hr tablet TAKE 1 TABLET(500 MG) BY MOUTH DAILY WITH BREAKFAST 04/15/22   Camillia Herter, NP  methimazole (TAPAZOLE) 5 MG tablet Take 1 tablet (5 mg total) by mouth daily.  01/04/22   Shamleffer, Melanie Crazier, MD  miconazole (MONISTAT 7 SIMPLY CURE) 2 % vaginal cream Place 1 Applicatorful vaginally at bedtime. 06/07/22   Apolonio Schneiders, FNP  Multiple Vitamins-Minerals (MULTIVITAMIN WITH MINERALS) tablet Take 1 tablet by mouth daily. 50 +    [provider]  NOVOLOG FLEXPEN 100 UNIT/ML FlexPen Inject 18 Units into the skin 3 (three) times daily with meals. 01/27/21   [name], FNP  omeprazole (PRILOSEC) 40 MG capsule TAKE 1 CAPSULE(40 MG) BY MOUTH DAILY BEFORE BREAKFAST 05/13/22   Camillia Herter, NP  oxyCODONE-acetaminophen (PERCOCET) 5-325 MG tablet Take 1 tablet by mouth every 4 (four) hours as needed. 05/25/22   Trula Slade, DPM  promethazine (PHENERGAN) 25 MG tablet Take 1 tablet (25 mg total) by mouth every 8 (eight) hours as needed for nausea or vomiting. 05/25/22   Trula Slade, DPM  Semaglutide, 1 MG/DOSE, (OZEMPIC, 1 MG/DOSE,) 4 MG/3ML SOPN Inject 1 mg into the skin once a week. 05/27/22   Camillia Herter, NP  spironolactone (ALDACTONE) 50 MG tablet TAKE 1 TABLET(50 MG) BY MOUTH DAILY 12/28/21   Camillia Herter, NP  traZODone (DESYREL) 150 MG tablet Take 150 mg by mouth at bedtime.  07/16/19   [provider]      Allergies    Patient has no known allergies.    Review of Systems   Review of Systems  Constitutional:  Negative for fever.  Musculoskeletal:  Positive for back pain.  Neurological:  Positive for weakness. Negative for dizziness, speech difficulty and numbness.  All other systems reviewed and are negative.   Physical Exam Updated Vital Signs BP 134/63   Pulse 91   Temp 98.1 F (36.7 C)   Resp (!) 26   SpO2 96%  Physical Exam Vitals and nursing note reviewed.  Constitutional:      Appearance: She is well-developed. She is obese.     Comments: Chronically ill-appearing but nontoxic  HENT:     Head: Normocephalic and atraumatic.  Eyes:     Pupils: Pupils are equal, round, and reactive to light.   Cardiovascular:     Rate and Rhythm: Normal rate and regular rhythm.     Heart sounds: Normal heart sounds.  Pulmonary:     Effort: Pulmonary effort is normal. No respiratory distress.  Abdominal:     Palpations: Abdomen is soft.  Musculoskeletal:     Cervical back: Neck supple.  Skin:    General: Skin is warm and dry.  Neurological:     Mental Status: She is alert and oriented to person, place, and time.     Comments: Cranial nerves II through XII intact, 5 out of 5 strength bilateral upper extremities,  3 out of 5 strength proximal muscles of the right lower extremity, 4 out of 5 strength with plantar and dorsiflexion of the foot, drift noted     ED Results / Procedures / Treatments   Labs (all labs ordered are listed, but only abnormal results are displayed) Labs Reviewed  ETHANOL - Abnormal; Notable for the following components:      Result Value   Alcohol, Ethyl (B) 14 (*)    All other components within normal limits  CBC - Abnormal; Notable for the following components:   Hemoglobin 10.5 (*)    HCT 31.7 (*)    MCV 71.9 (*)    MCH 23.8 (*)    All other components within normal limits  COMPREHENSIVE METABOLIC PANEL - Abnormal; Notable for the following components:   Sodium 133 (*)    CO2 20 (*)    Glucose, Bld 179 (*)    BUN 28 (*)    Creatinine, Ser 1.35 (*)    Alkaline Phosphatase 143 (*)    GFR, Estimated 44 (*)    All other components within normal limits  PROTIME-INR  APTT  DIFFERENTIAL  RAPID URINE DRUG SCREEN, HOSP PERFORMED  URINALYSIS, ROUTINE W REFLEX MICROSCOPIC    EKG EKG Interpretation  Date/Time:  Saturday August 20 2022 00:13:22 EST Ventricular Rate:  89 PR Interval:  130 QRS Duration: 82 QT Interval:  376 QTC Calculation: 458 R Axis:   15 Text Interpretation: Sinus rhythm Confirmed by Thayer Jew [phone]) on 08/20/2022 3:18:10 AM  Radiology CT HEAD WO CONTRAST  Result Date: 08/20/2022 CLINICAL DATA:  Multiple falls. EXAM: CT  HEAD WITHOUT CONTRAST TECHNIQUE: Contiguous axial images were obtained from the base of the skull through the vertex without intravenous contrast. RADIATION DOSE REDUCTION: This exam was performed according to the departmental dose-optimization program which includes automated exposure control, adjustment of the mA and/or kV according to patient size and/or use of iterative reconstruction technique. COMPARISON:  July 13, 2022 FINDINGS: Brain: There is mild cerebral atrophy with widening of the extra-axial spaces and ventricular dilatation. There are areas of decreased attenuation within the white matter tracts of the supratentorial brain, consistent with microvascular disease changes. Vascular: No hyperdense vessel or unexpected calcification. Skull: Normal. Negative for fracture or focal lesion. Sinuses/Orbits: No acute finding. Other: None. IMPRESSION: 1. No acute intracranial abnormality. 2. Generalized cerebral atrophy and microvascular disease changes of the supratentorial brain. Electronically Signed   By: Virgina Norfolk M.D.   On: 08/20/2022 01:07   DG Lumbar Spine Complete  Result Date: 08/20/2022 CLINICAL DATA:  Fall, sciatica EXAM: LUMBAR SPINE - COMPLETE 6 VIEW COMPARISON:  02/11/2021 lumbar spine radiographs FINDINGS: Status post L4-L5 fusion with interbody disc spacers at L3-L4 and L4-L5. Remaining intervertebral disc heights are largely maintained. There is no evidence of lumbar spine fracture. Alignment is normal. IMPRESSION: No acute osseous abnormality. Electronically Signed   By: Merilyn Baba M.D.   On: 08/20/2022 00:56    Procedures Procedures    Medications Ordered in ED Medications  dexamethasone (DECADRON) injection 10 mg (10 mg Intravenous Given 08/20/22 0136)  traMADol (ULTRAM) tablet 50 mg (50 mg Oral Given 08/20/22 0136)    ED Course/ Medical Decision Making/ A&P                           Medical Decision Making Amount and/or Complexity of Data Reviewed Labs:  ordered. Radiology: ordered.  Risk Prescription drug management.   This patient  presents to the ED for concern of right leg weakness, this involves an extensive number of treatment options, and is a complaint that carries with it a high risk of complications and morbidity.  I considered the following differential and admission for this acute, potentially life threatening condition.  The differential diagnosis includes sciatica, cauda equina, stroke  MDM:    This is a 66 year old female who presents with right-sided leg weakness.  Has had multiple falls secondary to weakness.  She also reports some back pain with radiation into the leg which is classic for sciatica.  However, she also endorses weakness on the left which is inconsistent.  She denies significant injury.  X-ray showed no evidence of fracture.  On her initial evaluation, stroke evaluation was initiated.  Labs reviewed.  EtOH 14 which could contribute to frequent falls.  Additionally, she has some mild AKI.  CT scan showed no evidence of acute traumatic injury or head bleed.  Patient was given Decadron and pain medication.  I attempted to ambulate the patient and she was unable to bear weight and her legs buckled right greater than left with ambulation.  Feel this warrants more dedicated imaging.  Will order MRI lumbar spine and MRI brain to rule out stroke.  Dr. Dayna Barker excepting at [facility] emergency department.  (Labs, imaging, consults)  Labs: I Ordered, and personally interpreted labs.  The pertinent results include: CBC, CMP, EtOH  Imaging Studies ordered: I ordered imaging studies including CT head, lumbar spine films I independently visualized and interpreted imaging. I agree with the radiologist interpretation  Additional history obtained from family at bedside.  External records from outside source obtained and reviewed including prior evaluations  Cardiac Monitoring: The patient was maintained on a cardiac monitor.  I  personally viewed and interpreted the cardiac monitored which showed an underlying rhythm of: Sinus rhythm  Reevaluation: After the interventions noted above, I reevaluated the patient and found that they have :stayed the same  Social Determinants of Health:  lives with family  Disposition: Transfer for MRI  Co morbidities that complicate the patient evaluation  Past Medical History:  Diagnosis Date   Anemia    Anxiety    Arthritis    Asthma    Bunion    Callus    Chronic pain    Cocaine abuse ([place])    COPD (chronic obstructive pulmonary disease) ([place])    Corns and callosities    Degenerative joint disease    Depression    Diabetes mellitus    Endometrial polyp    ETOH abuse    Gall stones    GERD (gastroesophageal reflux disease)    Headache    history of Migraines   Hepatitis C    Hep C   Hyperlipidemia    Hypertension    Insomnia    Spondylolisthesis of lumbar region    Substance abuse ([place])    alcoholism   Tuberculosis 1985   Wears dentures    Wears glasses      Medicines Meds ordered this encounter  Medications   dexamethasone (DECADRON) injection 10 mg   traMADol (ULTRAM) tablet 50 mg    I have reviewed the patients home medicines and have made adjustments as needed  Problem List / ED Course: Problem List Items Addressed This Visit   None Visit Diagnoses     Right leg weakness    -  Primary   Sciatica of right side  Final Clinical Impression(s) / ED Diagnoses Final diagnoses:  Right leg weakness  Sciatica of right side    Rx / DC Orders ED Discharge Orders     None         Everitt Wenner, Barbette Hair, MD 08/20/22 0320

## 2022-08-20 NOTE — ED Notes (Signed)
Patient from [facility], needing MRI.  NAD.

## 2022-08-20 NOTE — Consult Note (Signed)
   Providing Compassionate, Quality Care - Together  Neurosurgery Consult  Referring physician: EDMD Reason for referral: Bilateral lower extremity weakness  Chief Complaint: Progressive lower extremity weakness, difficulty walking  History of Present Illness: This is a 66 year old female with a history of diabetes, hypothyroidism, cervical myelopathy, status post C4-7 ACDF in January 2023 by Dr. Jordan Likes, with a history of L4-S1 decompression and fusion years ago, with complaints of over the past few days progressive lower extremity weakness and difficulty walking.  She also had multiple falls due to this.  She denies any numbness tingling or radiating radiculopathy.  She states she has just had progressive weakness.  She had an MRI of her cervical spine in October which revealed no adjacent segment disease.  She denies any bowel or bladder changes.  She denies any incontinence.   Medications: I have reviewed the patient's current medications. Allergies: No Known Allergies  History reviewed. No pertinent family history. Social History:  has no history on file for tobacco use, alcohol use, and drug use.  ROS: All pertinent positives negatives listed HPI above  Physical Exam:  Vital signs in last 24 hours: Temp:  [98 F (36.7 C)-98.3 F (36.8 C)] 98 F (36.7 C) (07/25 1814) Pulse Rate:  [58-128] 65 (07/26 0746) Resp:  [11-18] 14 (07/26 0217) BP: (138-182)/(65-125) 153/88 (07/26 0700) SpO2:  [91 %-98 %] 96 % (07/26 0746) PE: Awake alert oriented x3, no acute distress PERRLA Face symmetric Speech fluent and appropriate Cranial nerves II through XII intact Bilateral upper extremity full strength throughout Bilateral lower extremity 3/5 proximally in hip flexors/knee extensor, 3/5 in dorsiflexion/EHL, 4/5 in plantarflexion No decrease sensation light touch bilateral lower extremities Slight hyperreflexia in the left lower extremity patellar and Achilles, with 2 beats of clonus, no  hyperreflexia in right lower extremity   Impression/Assessment:  66 year old female with  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  There is facet edema bilaterally.  I do not believe that this fully explains her bilateral lower extremity weakness and legs giving out therefore recommend MRI thoracic spine. -Patient is currently being admitted for further work-up.   Thank you for allowing me to participate in this patient's care.  Please do not hesitate to call with questions or concerns.   Monia Pouch, DO Neurosurgeon [facility] Cell: [phone]

## 2022-08-20 NOTE — ED Notes (Signed)
Pt to MRI

## 2022-08-20 NOTE — ED Notes (Signed)
Report given to ed charge RN woody and [facility] via phone.

## 2022-08-20 NOTE — ED Provider Notes (Signed)
7:48 AM Care assumed from Dr. Clayborne Dana.  At time of transfer of care, patient is waiting for results of MRI head and back looking for etiologies of the leg discomfort and difficulty with ambulation.  If work-up is reassuring, anticipate discharge with outpatient follow-up.  9:29 AM MRIs have returned.  MRI of the head does not show any evidence of acute stroke, just some old white matter changes.  The MRI of her lumbar spine did not show acute fracture dislocation and showed the previous surgery does not appear to have complications but there is progressive disease surrounding the area as well as some possible radiculitis.  When I examined the patient, she could not lift either leg off the bed.  She is not having any numbness.  The MRI also showed concern for possible retention although patient says she is able to urinate.  Due to her progressive weakness in both legs with progressive changes on her back imaging, we will speak with neurosurgery to discuss a plan.  As she cannot lift her legs off the bed and ambulates normally, anticipate she may need admission for PT/OT depending on what neurosurgery says.  We will give more pain medicine while we wait to discuss with neurosurgery.  10:05 AM Spoke to Dr. Jake Samples with neurosurgery who reviewed the images and case.  He agrees that he needs admission to medicine and they will see her in consultation.  He think she needs a trial of steroids to see if symptoms improve if not, she may need surgical management at that time.  We will call for admission to medicine for further monitoring and management as she went from being ambulatory to not so today.   Antwyne Pingree, Canary Brim, MD 08/20/22 [phone]

## 2022-08-20 NOTE — ED Notes (Signed)
Assisted pt to wheelchair. Pt self transferred from bed to chair with minimal assist. Pt then washed up in restroom independently. Linen on bed changed, pt in new gown.

## 2022-08-21 ENCOUNTER — Inpatient Hospital Stay (HOSPITAL_COMMUNITY): Payer: Medicaid Other

## 2022-08-21 ENCOUNTER — Observation Stay (HOSPITAL_COMMUNITY): Payer: Medicaid Other

## 2022-08-21 DIAGNOSIS — D638 Anemia in other chronic diseases classified elsewhere: Secondary | ICD-10-CM | POA: Diagnosis present

## 2022-08-21 DIAGNOSIS — Z981 Arthrodesis status: Secondary | ICD-10-CM | POA: Diagnosis not present

## 2022-08-21 DIAGNOSIS — T380X5A Adverse effect of glucocorticoids and synthetic analogues, initial encounter: Secondary | ICD-10-CM | POA: Diagnosis present

## 2022-08-21 DIAGNOSIS — E0590 Thyrotoxicosis, unspecified without thyrotoxic crisis or storm: Secondary | ICD-10-CM | POA: Diagnosis present

## 2022-08-21 DIAGNOSIS — M79604 Pain in right leg: Secondary | ICD-10-CM | POA: Diagnosis not present

## 2022-08-21 DIAGNOSIS — R296 Repeated falls: Secondary | ICD-10-CM | POA: Diagnosis present

## 2022-08-21 DIAGNOSIS — J4540 Moderate persistent asthma, uncomplicated: Secondary | ICD-10-CM | POA: Diagnosis present

## 2022-08-21 DIAGNOSIS — E861 Hypovolemia: Secondary | ICD-10-CM | POA: Diagnosis not present

## 2022-08-21 DIAGNOSIS — D72829 Elevated white blood cell count, unspecified: Secondary | ICD-10-CM | POA: Diagnosis not present

## 2022-08-21 DIAGNOSIS — E875 Hyperkalemia: Secondary | ICD-10-CM | POA: Diagnosis present

## 2022-08-21 DIAGNOSIS — M5416 Radiculopathy, lumbar region: Secondary | ICD-10-CM | POA: Diagnosis present

## 2022-08-21 DIAGNOSIS — Z79899 Other long term (current) drug therapy: Secondary | ICD-10-CM | POA: Diagnosis not present

## 2022-08-21 DIAGNOSIS — D509 Iron deficiency anemia, unspecified: Secondary | ICD-10-CM | POA: Diagnosis present

## 2022-08-21 DIAGNOSIS — I10 Essential (primary) hypertension: Secondary | ICD-10-CM | POA: Diagnosis present

## 2022-08-21 DIAGNOSIS — Z794 Long term (current) use of insulin: Secondary | ICD-10-CM | POA: Diagnosis not present

## 2022-08-21 DIAGNOSIS — R29898 Other symptoms and signs involving the musculoskeletal system: Secondary | ICD-10-CM | POA: Diagnosis not present

## 2022-08-21 DIAGNOSIS — E1122 Type 2 diabetes mellitus with diabetic chronic kidney disease: Secondary | ICD-10-CM | POA: Diagnosis not present

## 2022-08-21 DIAGNOSIS — Y900 Blood alcohol level of less than 20 mg/100 ml: Secondary | ICD-10-CM | POA: Diagnosis present

## 2022-08-21 DIAGNOSIS — E1169 Type 2 diabetes mellitus with other specified complication: Secondary | ICD-10-CM | POA: Diagnosis not present

## 2022-08-21 DIAGNOSIS — D649 Anemia, unspecified: Secondary | ICD-10-CM | POA: Diagnosis not present

## 2022-08-21 DIAGNOSIS — Z87891 Personal history of nicotine dependence: Secondary | ICD-10-CM | POA: Diagnosis not present

## 2022-08-21 DIAGNOSIS — M48061 Spinal stenosis, lumbar region without neurogenic claudication: Secondary | ICD-10-CM | POA: Diagnosis not present

## 2022-08-21 DIAGNOSIS — Z9889 Other specified postprocedural states: Secondary | ICD-10-CM | POA: Diagnosis not present

## 2022-08-21 DIAGNOSIS — F32A Depression, unspecified: Secondary | ICD-10-CM | POA: Diagnosis present

## 2022-08-21 DIAGNOSIS — M48062 Spinal stenosis, lumbar region with neurogenic claudication: Secondary | ICD-10-CM | POA: Diagnosis not present

## 2022-08-21 DIAGNOSIS — W19XXXA Unspecified fall, initial encounter: Secondary | ICD-10-CM | POA: Diagnosis not present

## 2022-08-21 DIAGNOSIS — E785 Hyperlipidemia, unspecified: Secondary | ICD-10-CM | POA: Diagnosis present

## 2022-08-21 DIAGNOSIS — K219 Gastro-esophageal reflux disease without esophagitis: Secondary | ICD-10-CM | POA: Diagnosis present

## 2022-08-21 DIAGNOSIS — Z6834 Body mass index (BMI) 34.0-34.9, adult: Secondary | ICD-10-CM | POA: Diagnosis not present

## 2022-08-21 DIAGNOSIS — E876 Hypokalemia: Secondary | ICD-10-CM | POA: Diagnosis not present

## 2022-08-21 DIAGNOSIS — E871 Hypo-osmolality and hyponatremia: Secondary | ICD-10-CM | POA: Diagnosis not present

## 2022-08-21 DIAGNOSIS — E872 Acidosis, unspecified: Secondary | ICD-10-CM | POA: Diagnosis present

## 2022-08-21 DIAGNOSIS — R5381 Other malaise: Secondary | ICD-10-CM | POA: Diagnosis not present

## 2022-08-21 DIAGNOSIS — E669 Obesity, unspecified: Secondary | ICD-10-CM | POA: Diagnosis present

## 2022-08-21 DIAGNOSIS — E1165 Type 2 diabetes mellitus with hyperglycemia: Secondary | ICD-10-CM | POA: Diagnosis present

## 2022-08-21 DIAGNOSIS — M5431 Sciatica, right side: Secondary | ICD-10-CM | POA: Diagnosis present

## 2022-08-21 DIAGNOSIS — N179 Acute kidney failure, unspecified: Secondary | ICD-10-CM | POA: Diagnosis present

## 2022-08-21 LAB — BASIC METABOLIC PANEL
Anion gap: 13 (ref 5–15)
BUN: 28 mg/dL — ABNORMAL HIGH (ref 8–23)
CO2: 15 mmol/L — ABNORMAL LOW (ref 22–32)
Calcium: 9.9 mg/dL (ref 8.9–10.3)
Chloride: 107 mmol/L (ref 98–111)
Creatinine, Ser: 1.18 mg/dL — ABNORMAL HIGH (ref 0.44–1.00)
GFR, Estimated: 52 mL/min — ABNORMAL LOW (ref >60)
Glucose, Bld: 280 mg/dL — ABNORMAL HIGH (ref 70–99)
Potassium: 6 mmol/L — ABNORMAL HIGH (ref 3.5–5.1)
Sodium: 135 mmol/L (ref 135–145)

## 2022-08-21 LAB — CBC
HCT: 33.9 % — ABNORMAL LOW (ref 36.0–46.0)
Hemoglobin: 11 g/dL — ABNORMAL LOW (ref 12.0–15.0)
MCH: 23.4 pg — ABNORMAL LOW (ref 26.0–34.0)
MCHC: 32.4 g/dL (ref 30.0–36.0)
MCV: 72.1 fL — ABNORMAL LOW (ref 80.0–100.0)
Platelets: 185 10*3/uL (ref 150–400)
RBC: 4.7 MIL/uL (ref 3.87–5.11)
RDW: 15.5 % (ref 11.5–15.5)
WBC: 7.5 10*3/uL (ref 4.0–10.5)
nRBC: 0 % (ref 0.0–0.2)

## 2022-08-21 LAB — GLUCOSE, CAPILLARY
Glucose-Capillary: 402 mg/dL — ABNORMAL HIGH (ref 70–99)
Glucose-Capillary: 207 mg/dL — ABNORMAL HIGH (ref 70–99)
Glucose-Capillary: 166 mg/dL — ABNORMAL HIGH (ref 70–99)
Glucose-Capillary: 290 mg/dL — ABNORMAL HIGH (ref 70–99)

## 2022-08-21 MED ORDER — INSULIN ASPART 100 UNIT/ML IJ SOLN
3.0000 [IU] | Freq: Three times a day (TID) | INTRAMUSCULAR | Status: DC
  Administered 2022-08-21 (×2): 3 [IU] via SUBCUTANEOUS

## 2022-08-21 MED ORDER — INSULIN DETEMIR 100 UNIT/ML ~~LOC~~ SOLN
15.0000 [IU] | Freq: Two times a day (BID) | SUBCUTANEOUS | Status: DC
  Administered 2022-08-21 – 2022-08-24 (×7): 15 [IU] via SUBCUTANEOUS
  Filled 2022-08-21 (×9): qty 0.15

## 2022-08-21 MED ORDER — SODIUM ZIRCONIUM CYCLOSILICATE 5 G PO PACK
5.0000 g | PACK | Freq: Two times a day (BID) | ORAL | Status: AC
  Administered 2022-08-21: 5 g via ORAL
  Filled 2022-08-21 (×2): qty 1
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
## 2022-08-21 NOTE — Progress Notes (Signed)
[facility]  PROGRESS NOTE  Kara Kelley [id number] DOB: 12/23/1955 DOA: 08/19/2022 PCP: Rema Fendt, NP   Brief HPI:   66 y.o. female with medical history significant of hypertension, hyperlipidemia, moderate persistent asthma, DM type II, hyperthyroidism, GERD, and polysubstance abuse   who presents with complaints of leg weakness after having repeated falls    She has had prior C4-C7 ACDF in January 2023 by Dr. Dutch Quint and prior L4-S1 decompression and fusion possibly back in 2018   MRI of the lumbar spine noted decompression and fusion changes at L4-L5 and L5-S1 with no adverse features, and progressed severe segment disease at L3 with new or increased moderate sized broad-based leftward disc protrusion contributing to moderate to severe spinal stenosis, severe left foraminal and severe left lateral recess stenosis, and bladder distention with over 525 mL of urine present.  Neurosurgery had been consulted recommending steroids, PT, and pain control   MRI of thoracic spine obtained, was unremarkable    Subjective   Patient seen and examined, still complains of right hip pain.   Assessment/Plan:    Bilateral lower extremity weakness -MRI lumbar spine noted decompression and fusion changes at L4-5 and L5-S1; progressed severe segment disease at L3 -MRI of thoracic spine was unremarkable -Neurosurgery following, neurosurgery is considering surgical intervention in the form of extension decompression and fusion at L3-4 pending her progression  Right hip pain -X-ray of the pelvis and right hip obtained today shows question irregularity of inferior pubic ramus fracture -We will obtain CT pelvis without contrast to confirm pelvic fracture   Acute kidney injury -Bladder distention at 525 mL -Creatinine has improved to 1.18 -We will check renal ultrasound   Hyperkalemia -Potassium is elevated at 6.0 -Unclear cause, Aldactone and lisinopril on hold -We will give 2  doses of Lokelma -Follow BMP in am  Diabetes mellitus type 2 -CBG was elevated due to Decadron -We will change Levemir to 15 units subcu twice daily -Continue sliding scale insulin with NovoLog -Start NovoLog 3 units 3 times daily meal coverage  Hypertension -Blood pressure stable Home medications on hold  Hyperthyroidism -Continue methimazole  GERD -Continue pharmacy substitution of Protonix for omeprazole   History of polysubstance abuse Patient reports that she will be 4 years in remission from alcohol, tobacco, and illicit drug use in January.  UDS was positive for opiates, but she had received these while in the emergency department.  Alcohol level was noted to be 14, but patient denies any use.  Unclear if this could be falsely elevated.  Medications     ARIPiprazole  10 mg Oral Daily   atorvastatin  10 mg Oral Daily   buPROPion  300 mg Oral Daily   dexamethasone (DECADRON) injection  4 mg Intravenous Q6H   enoxaparin (LOVENOX) injection  40 mg Subcutaneous Q24H   ferrous sulfate  325 mg Oral Daily   insulin aspart  0-20 Units Subcutaneous TID WC   insulin aspart  3 Units Subcutaneous TID WC   insulin detemir  15 Units Subcutaneous BID   methimazole  5 mg Oral Daily   pantoprazole  40 mg Oral Daily   traZODone  150 mg Oral QHS   zolpidem  5 mg Oral QHS     Data Reviewed:   CBG:  Recent Labs  Lab 08/20/22 1708 08/21/22 0822 08/21/22 1149  GLUCAP 256* 402* 207*    SpO2: 100 %    Vitals:   08/20/22 2200 08/20/22 2330 08/21/22 0823 08/21/22 1436  BP: (!) 137/117 (!) 146/77 (!) 140/77 134/77  Pulse: 99 (!) 107 94 95  Resp: (!) 22 16 18 18   Temp:  98 F (36.7 C) 98.2 F (36.8 C) 97.8 F (36.6 C)  TempSrc:      SpO2: 94% 93% 98% 100%  Weight:      Height:          Data Reviewed:  Basic Metabolic Panel: Recent Labs  Lab 08/20/22 0011 08/21/22 0212  NA 133* 135  K 4.6 6.0*  CL 104 107  CO2 20* 15*  GLUCOSE 179* 280*  BUN 28* 28*   CREATININE 1.35* 1.18*  CALCIUM 9.1 9.9    CBC: Recent Labs  Lab 08/20/22 0011 08/21/22 0212  WBC 7.4 7.5  NEUTROABS 4.0  --   HGB 10.5* 11.0*  HCT 31.7* 33.9*  MCV 71.9* 72.1*  PLT 194 185    LFT Recent Labs  Lab 08/20/22 0011  AST 22  ALT 18  ALKPHOS 143*  BILITOT 0.3  PROT 7.0  ALBUMIN 4.0     Antibiotics: Anti-infectives (From admission, onward)    None        DVT prophylaxis: Lovenox  Code Status: Full code  Family Communication: No family at bedside   CONSULTS neurosurgery   Objective    Physical Examination:   General-appears in no acute distress Heart-S1-S2, regular, no murmur auscultated Lungs-clear to auscultation bilaterally, no wheezing or crackles auscultated Abdomen-soft, nontender, no organomegaly Extremities-no edema in the lower extremities Right hip-tenderness at right hip Neuro-alert, oriented x3, no focal deficit noted  Status is: Inpatient:             13/11/23   [facility] If 7PM-7AM, please contact night-coverage at [email or web address], Office  [phone]   08/21/2022, 2:50 PM  LOS: 0 days

## 2022-08-21 NOTE — Progress Notes (Signed)
   Providing Compassionate, Quality Care - Together  NEUROSURGERY PROGRESS NOTE   S: No issues overnight.  States she has some slight improvement in her strength  O: EXAM:  BP (!) 140/77 (BP Location: Left Arm)   Pulse 94   Temp 98.2 F (36.8 C)   Resp 18   Ht 4\' 11"  (1.499 m)   Wt 77.1 kg   SpO2 98%   BMI 34.34 kg/m   Awake, alert, oriented  PERRL Speech fluent, appropriate  CNs grossly intact  5/5 BUE RLE HF 3/5, DF 3/5, PF 4-/5 LLE HF 4/5, DF 3/5, PF 4/5  ASSESSMENT:  66 y.o. female with   L3-4 severe stenosis with bilateral lower extremity radiculopathy and weakness  PLAN: -MRI T-spine obtained and reviewed.  No high-grade stenosis. -Continue pain control and therapies -May need surgical intervention in the form of extension decompression and fusion at L3-4 pending her progression    Thank you for allowing me to participate in this patient's care.  Please do not hesitate to call with questions or concerns.   79, DO Neurosurgeon [facility] Cell: [phone]

## 2022-08-21 NOTE — H&P (View-Only) (Signed)
[facility]  PROGRESS NOTE  Kara Kelley VOH:[id number] DOB: 12/23/1955 DOA: 08/19/2022 PCP: Rema Fendt, NP   Brief HPI:   66 y.o. female with medical history significant of hypertension, hyperlipidemia, moderate persistent asthma, DM type II, hyperthyroidism, GERD, and polysubstance abuse   who presents with complaints of leg weakness after having repeated falls    She has had prior C4-C7 ACDF in January 2023 by Dr. Dutch Quint and prior L4-S1 decompression and fusion possibly back in 2018   MRI of the lumbar spine noted decompression and fusion changes at L4-L5 and L5-S1 with no adverse features, and progressed severe segment disease at L3 with new or increased moderate sized broad-based leftward disc protrusion contributing to moderate to severe spinal stenosis, severe left foraminal and severe left lateral recess stenosis, and bladder distention with over 525 mL of urine present.  Neurosurgery had been consulted recommending steroids, PT, and pain control   MRI of thoracic spine obtained, was unremarkable    Subjective   Patient seen and examined, still complains of right hip pain.   Assessment/Plan:    Bilateral lower extremity weakness -MRI lumbar spine noted decompression and fusion changes at L4-5 and L5-S1; progressed severe segment disease at L3 -MRI of thoracic spine was unremarkable -Neurosurgery following, neurosurgery is considering surgical intervention in the form of extension decompression and fusion at L3-4 pending her progression  Right hip pain -X-ray of the pelvis and right hip obtained today shows question irregularity of inferior pubic ramus fracture -We will obtain CT pelvis without contrast to confirm pelvic fracture   Acute kidney injury -Bladder distention at 525 mL -Creatinine has improved to 1.18 -We will check renal ultrasound   Hyperkalemia -Potassium is elevated at 6.0 -Unclear cause, Aldactone and lisinopril on hold -We will give 2  doses of Lokelma -Follow BMP in am  Diabetes mellitus type 2 -CBG was elevated due to Decadron -We will change Levemir to 15 units subcu twice daily -Continue sliding scale insulin with NovoLog -Start NovoLog 3 units 3 times daily meal coverage  Hypertension -Blood pressure stable Home medications on hold  Hyperthyroidism -Continue methimazole  GERD -Continue pharmacy substitution of Protonix for omeprazole   History of polysubstance abuse Patient reports that she will be 4 years in remission from alcohol, tobacco, and illicit drug use in January.  UDS was positive for opiates, but she had received these while in the emergency department.  Alcohol level was noted to be 14, but patient denies any use.  Unclear if this could be falsely elevated.  Medications     ARIPiprazole  10 mg Oral Daily   atorvastatin  10 mg Oral Daily   buPROPion  300 mg Oral Daily   dexamethasone (DECADRON) injection  4 mg Intravenous Q6H   enoxaparin (LOVENOX) injection  40 mg Subcutaneous Q24H   ferrous sulfate  325 mg Oral Daily   insulin aspart  0-20 Units Subcutaneous TID WC   insulin aspart  3 Units Subcutaneous TID WC   insulin detemir  15 Units Subcutaneous BID   methimazole  5 mg Oral Daily   pantoprazole  40 mg Oral Daily   traZODone  150 mg Oral QHS   zolpidem  5 mg Oral QHS     Data Reviewed:   CBG:  Recent Labs  Lab 08/20/22 1708 08/21/22 0822 08/21/22 1149  GLUCAP 256* 402* 207*    SpO2: 100 %    Vitals:   08/20/22 2200 08/20/22 2330 08/21/22 0823 08/21/22 1436  BP: (!) 137/117 (!) 146/77 (!) 140/77 134/77  Pulse: 99 (!) 107 94 95  Resp: (!) 22 16 18 18   Temp:  98 F (36.7 C) 98.2 F (36.8 C) 97.8 F (36.6 C)  TempSrc:      SpO2: 94% 93% 98% 100%  Weight:      Height:          Data Reviewed:  Basic Metabolic Panel: Recent Labs  Lab 08/20/22 0011 08/21/22 0212  NA 133* 135  K 4.6 6.0*  CL 104 107  CO2 20* 15*  GLUCOSE 179* 280*  BUN 28* 28*   CREATININE 1.35* 1.18*  CALCIUM 9.1 9.9    CBC: Recent Labs  Lab 08/20/22 0011 08/21/22 0212  WBC 7.4 7.5  NEUTROABS 4.0  --   HGB 10.5* 11.0*  HCT 31.7* 33.9*  MCV 71.9* 72.1*  PLT 194 185    LFT Recent Labs  Lab 08/20/22 0011  AST 22  ALT 18  ALKPHOS 143*  BILITOT 0.3  PROT 7.0  ALBUMIN 4.0     Antibiotics: Anti-infectives (From admission, onward)    None        DVT prophylaxis: Lovenox  Code Status: Full code  Family Communication: No family at bedside   CONSULTS neurosurgery   Objective    Physical Examination:   General-appears in no acute distress Heart-S1-S2, regular, no murmur auscultated Lungs-clear to auscultation bilaterally, no wheezing or crackles auscultated Abdomen-soft, nontender, no organomegaly Extremities-no edema in the lower extremities Right hip-tenderness at right hip Neuro-alert, oriented x3, no focal deficit noted  Status is: Inpatient:             13/11/23   [facility] If 7PM-7AM, please contact night-coverage at [email or web address], Office  [phone]   08/21/2022, 2:50 PM  LOS: 0 days

## 2022-08-22 ENCOUNTER — Inpatient Hospital Stay (HOSPITAL_COMMUNITY): Payer: Medicaid Other | Admitting: Certified Registered Nurse Anesthetist

## 2022-08-22 ENCOUNTER — Other Ambulatory Visit: Payer: Self-pay

## 2022-08-22 ENCOUNTER — Encounter (HOSPITAL_COMMUNITY): Payer: Self-pay | Admitting: Internal Medicine

## 2022-08-22 ENCOUNTER — Encounter (HOSPITAL_COMMUNITY)
Admission: EM | Disposition: A | Discharge status: Rehabilitation Facility | Payer: Self-pay | Source: Home / Self Care | Attending: Family Medicine

## 2022-08-22 ENCOUNTER — Inpatient Hospital Stay (HOSPITAL_COMMUNITY): Payer: Medicaid Other

## 2022-08-22 DIAGNOSIS — Z87891 Personal history of nicotine dependence: Secondary | ICD-10-CM | POA: Diagnosis not present

## 2022-08-22 DIAGNOSIS — R296 Repeated falls: Secondary | ICD-10-CM

## 2022-08-22 DIAGNOSIS — M5416 Radiculopathy, lumbar region: Secondary | ICD-10-CM

## 2022-08-22 DIAGNOSIS — W19XXXA Unspecified fall, initial encounter: Secondary | ICD-10-CM | POA: Diagnosis not present

## 2022-08-22 DIAGNOSIS — M5431 Sciatica, right side: Secondary | ICD-10-CM | POA: Diagnosis not present

## 2022-08-22 DIAGNOSIS — M48061 Spinal stenosis, lumbar region without neurogenic claudication: Secondary | ICD-10-CM

## 2022-08-22 DIAGNOSIS — N179 Acute kidney failure, unspecified: Secondary | ICD-10-CM | POA: Diagnosis not present

## 2022-08-22 DIAGNOSIS — R29898 Other symptoms and signs involving the musculoskeletal system: Secondary | ICD-10-CM | POA: Diagnosis not present

## 2022-08-22 DIAGNOSIS — I10 Essential (primary) hypertension: Secondary | ICD-10-CM

## 2022-08-22 LAB — CBC
HCT: 29.9 % — ABNORMAL LOW (ref 36.0–46.0)
Hemoglobin: 10.2 g/dL — ABNORMAL LOW (ref 12.0–15.0)
MCH: 24.3 pg — ABNORMAL LOW (ref 26.0–34.0)
MCHC: 34.1 g/dL (ref 30.0–36.0)
MCV: 71.2 fL — ABNORMAL LOW (ref 80.0–100.0)
Platelets: 157 10*3/uL (ref 150–400)
RBC: 4.2 MIL/uL (ref 3.87–5.11)
RDW: 15.2 % (ref 11.5–15.5)
WBC: 7.7 10*3/uL (ref 4.0–10.5)
nRBC: 0 % (ref 0.0–0.2)

## 2022-08-22 LAB — BASIC METABOLIC PANEL
Anion gap: 7 (ref 5–15)
BUN: 33 mg/dL — ABNORMAL HIGH (ref 8–23)
CO2: 17 mmol/L — ABNORMAL LOW (ref 22–32)
Calcium: 9.3 mg/dL (ref 8.9–10.3)
Chloride: 110 mmol/L (ref 98–111)
Creatinine, Ser: 1.16 mg/dL — ABNORMAL HIGH (ref 0.44–1.00)
GFR, Estimated: 53 mL/min — ABNORMAL LOW (ref >60)
Glucose, Bld: 255 mg/dL — ABNORMAL HIGH (ref 70–99)
Potassium: 5.1 mmol/L (ref 3.5–5.1)
Sodium: 134 mmol/L — ABNORMAL LOW (ref 135–145)

## 2022-08-22 LAB — GLUCOSE, CAPILLARY
Glucose-Capillary: 139 mg/dL — ABNORMAL HIGH (ref 70–99)
Glucose-Capillary: 145 mg/dL — ABNORMAL HIGH (ref 70–99)
Glucose-Capillary: 145 mg/dL — ABNORMAL HIGH (ref 70–99)
Glucose-Capillary: 204 mg/dL — ABNORMAL HIGH (ref 70–99)
Glucose-Capillary: 221 mg/dL — ABNORMAL HIGH (ref 70–99)
Glucose-Capillary: 312 mg/dL — ABNORMAL HIGH (ref 70–99)
Glucose-Capillary: 502 mg/dL (ref 70–99)
Glucose-Capillary: 60 mg/dL — ABNORMAL LOW (ref 70–99)

## 2022-08-22 LAB — SURGICAL PCR SCREEN
MRSA, PCR: NEGATIVE
Staphylococcus aureus: NEGATIVE

## 2022-08-22 LAB — TYPE AND SCREEN
ABO/RH(D): A POS
Antibody Screen: NEGATIVE

## 2022-08-22 SURGERY — POSTERIOR LUMBAR FUSION 1 LEVEL
Anesthesia: General | Site: Back

## 2022-08-22 MED ORDER — 0.9 % SODIUM CHLORIDE (POUR BTL) OPTIME
TOPICAL | Status: DC | PRN
  Administered 2022-08-22: 1000 mL

## 2022-08-22 MED ORDER — CHLORHEXIDINE GLUCONATE 0.12 % MT SOLN: 15.0000 mL | Freq: Once | OROMUCOSAL | Status: AC

## 2022-08-22 MED ORDER — HYDROMORPHONE HCL 1 MG/ML IJ SOLN
0.2500 mg | INTRAMUSCULAR | Status: DC | PRN
  Administered 2022-08-22 (×2): 0.5 mg via INTRAVENOUS

## 2022-08-22 MED ORDER — SODIUM CHLORIDE 0.9% FLUSH: 3.0000 mL | INTRAVENOUS | Status: DC | PRN

## 2022-08-22 MED ORDER — INSULIN ASPART 100 UNIT/ML IJ SOLN: 6.0000 [IU] | Freq: Three times a day (TID) | INTRAMUSCULAR | Status: DC

## 2022-08-22 MED ORDER — SODIUM CHLORIDE 0.9 % IV SOLN: INTRAVENOUS | Status: AC

## 2022-08-22 MED ORDER — MIDAZOLAM HCL 2 MG/2ML IJ SOLN
INTRAMUSCULAR | Status: AC
  Filled 2022-08-22: qty 2

## 2022-08-22 MED ORDER — ROCURONIUM BROMIDE 10 MG/ML (PF) SYRINGE
PREFILLED_SYRINGE | INTRAVENOUS | Status: AC
  Filled 2022-08-22: qty 10

## 2022-08-22 MED ORDER — THROMBIN 20000 UNITS EX SOLR
CUTANEOUS | Status: AC
  Filled 2022-08-22: qty 20000

## 2022-08-22 MED ORDER — PHENOL 1.4 % MT LIQD: 1.0000 | OROMUCOSAL | Status: DC | PRN

## 2022-08-22 MED ORDER — BUPIVACAINE HCL (PF) 0.25 % IJ SOLN
INTRAMUSCULAR | Status: AC
  Filled 2022-08-22: qty 30

## 2022-08-22 MED ORDER — SODIUM CHLORIDE 0.9 % IV SOLN: 250.0000 mL | INTRAVENOUS | Status: DC

## 2022-08-22 MED ORDER — LIDOCAINE 2% (20 MG/ML) 5 ML SYRINGE
INTRAMUSCULAR | Status: AC
  Filled 2022-08-22: qty 5

## 2022-08-22 MED ORDER — INSULIN ASPART 100 UNIT/ML IJ SOLN: 0.0000 [IU] | Freq: Three times a day (TID) | INTRAMUSCULAR | Status: DC

## 2022-08-22 MED ORDER — CEFAZOLIN SODIUM-DEXTROSE 2-4 GM/100ML-% IV SOLN
INTRAVENOUS | Status: AC
  Filled 2022-08-22: qty 100

## 2022-08-22 MED ORDER — BUPIVACAINE HCL (PF) 0.25 % IJ SOLN
INTRAMUSCULAR | Status: DC | PRN
  Administered 2022-08-22: 20 mL

## 2022-08-22 MED ORDER — PHENYLEPHRINE HCL-NACL 20-0.9 MG/250ML-% IV SOLN
INTRAVENOUS | Status: DC | PRN
  Administered 2022-08-22: 20 ug/min via INTRAVENOUS
  Administered 2022-08-22: 80 ug via INTRAVENOUS

## 2022-08-22 MED ORDER — TRANEXAMIC ACID-NACL 1000-0.7 MG/100ML-% IV SOLN
INTRAVENOUS | Status: AC
  Filled 2022-08-22: qty 100

## 2022-08-22 MED ORDER — FENTANYL CITRATE (PF) 250 MCG/5ML IJ SOLN
INTRAMUSCULAR | Status: DC | PRN
  Administered 2022-08-22: 25 ug via INTRAVENOUS
  Administered 2022-08-22: 50 ug via INTRAVENOUS
  Administered 2022-08-22: 150 ug via INTRAVENOUS
  Administered 2022-08-22 (×2): 50 ug via INTRAVENOUS

## 2022-08-22 MED ORDER — OXYCODONE HCL 5 MG PO TABS: 10.0000 mg | ORAL_TABLET | ORAL | Status: DC | PRN

## 2022-08-22 MED ORDER — LIDOCAINE 2% (20 MG/ML) 5 ML SYRINGE
INTRAMUSCULAR | Status: DC | PRN
  Administered 2022-08-22: 100 mg via INTRAVENOUS

## 2022-08-22 MED ORDER — LACTATED RINGERS IV SOLN: INTRAVENOUS | Status: DC

## 2022-08-22 MED ORDER — INSULIN ASPART 100 UNIT/ML IJ SOLN: 3.0000 [IU] | Freq: Three times a day (TID) | INTRAMUSCULAR | Status: DC

## 2022-08-22 MED ORDER — CEFAZOLIN SODIUM-DEXTROSE 1-4 GM/50ML-% IV SOLN
1.0000 g | Freq: Three times a day (TID) | INTRAVENOUS | Status: AC
  Administered 2022-08-22 – 2022-08-23 (×2): 1 g via INTRAVENOUS
  Filled 2022-08-22 (×2): qty 50

## 2022-08-22 MED ORDER — MORPHINE SULFATE (PF) 2 MG/ML IV SOLN
2.0000 mg | INTRAVENOUS | Status: DC | PRN
  Administered 2022-08-22 – 2022-08-23 (×3): 2 mg via INTRAVENOUS
  Filled 2022-08-22 (×3): qty 1

## 2022-08-22 MED ORDER — FENTANYL CITRATE (PF) 250 MCG/5ML IJ SOLN
INTRAMUSCULAR | Status: AC
  Filled 2022-08-22: qty 5

## 2022-08-22 MED ORDER — INSULIN DETEMIR 100 UNIT/ML ~~LOC~~ SOLN: 10.0000 [IU] | Freq: Two times a day (BID) | SUBCUTANEOUS | Status: DC

## 2022-08-22 MED ORDER — ORAL CARE MOUTH RINSE: 15.0000 mL | Freq: Once | OROMUCOSAL | Status: AC

## 2022-08-22 MED ORDER — INSULIN ASPART 100 UNIT/ML IJ SOLN
0.0000 [IU] | INTRAMUSCULAR | Status: DC
  Administered 2022-08-22 (×2): 7 [IU] via SUBCUTANEOUS
  Administered 2022-08-23 (×3): 4 [IU] via SUBCUTANEOUS
  Administered 2022-08-23: 20 [IU] via SUBCUTANEOUS
  Administered 2022-08-23 (×2): 4 [IU] via SUBCUTANEOUS
  Administered 2022-08-24: 3 [IU] via SUBCUTANEOUS
  Administered 2022-08-24 (×2): 4 [IU] via SUBCUTANEOUS

## 2022-08-22 MED ORDER — HYDROCODONE-ACETAMINOPHEN 10-325 MG PO TABS: 1.0000 | ORAL_TABLET | ORAL | Status: DC | PRN

## 2022-08-22 MED ORDER — FLEET ENEMA 7-19 GM/118ML RE ENEM: 1.0000 | ENEMA | Freq: Once | RECTAL | Status: DC | PRN

## 2022-08-22 MED ORDER — SUGAMMADEX SODIUM 200 MG/2ML IV SOLN
INTRAVENOUS | Status: DC | PRN
  Administered 2022-08-22: 200 mg via INTRAVENOUS
  Administered 2022-08-22: 100 mg via INTRAVENOUS

## 2022-08-22 MED ORDER — SODIUM CHLORIDE 0.9% FLUSH
3.0000 mL | Freq: Two times a day (BID) | INTRAVENOUS | Status: DC
  Administered 2022-08-23 (×2): 3 mL via INTRAVENOUS

## 2022-08-22 MED ORDER — PROPOFOL 10 MG/ML IV BOLUS
INTRAVENOUS | Status: DC | PRN
  Administered 2022-08-22: 140 mg via INTRAVENOUS

## 2022-08-22 MED ORDER — INSULIN ASPART 100 UNIT/ML IJ SOLN: 0.0000 [IU] | Freq: Every day | INTRAMUSCULAR | Status: DC

## 2022-08-22 MED ORDER — HYDROMORPHONE HCL 1 MG/ML IJ SOLN
1.0000 mg | INTRAMUSCULAR | Status: DC | PRN
  Administered 2022-08-23 – 2022-08-24 (×5): 1 mg via INTRAVENOUS
  Filled 2022-08-22 (×5): qty 1

## 2022-08-22 MED ORDER — HYDROMORPHONE HCL 1 MG/ML IJ SOLN
INTRAMUSCULAR | Status: AC
  Filled 2022-08-22: qty 1

## 2022-08-22 MED ORDER — POLYETHYLENE GLYCOL 3350 17 G PO PACK: 17.0000 g | PACK | Freq: Every day | ORAL | Status: DC | PRN

## 2022-08-22 MED ORDER — MIDAZOLAM HCL 2 MG/2ML IJ SOLN
INTRAMUSCULAR | Status: DC | PRN
  Administered 2022-08-22: 2 mg via INTRAVENOUS

## 2022-08-22 MED ORDER — TRANEXAMIC ACID-NACL 1000-0.7 MG/100ML-% IV SOLN: 1000.0000 mg | INTRAVENOUS | Status: DC

## 2022-08-22 MED ORDER — PROPOFOL 10 MG/ML IV BOLUS
INTRAVENOUS | Status: AC
  Filled 2022-08-22: qty 20

## 2022-08-22 MED ORDER — CEFAZOLIN SODIUM-DEXTROSE 2-4 GM/100ML-% IV SOLN
2.0000 g | INTRAVENOUS | Status: AC
  Administered 2022-08-22: 2 g via INTRAVENOUS

## 2022-08-22 MED ORDER — THROMBIN 20000 UNITS EX SOLR: CUTANEOUS | Status: DC | PRN

## 2022-08-22 MED ORDER — ROCURONIUM BROMIDE 10 MG/ML (PF) SYRINGE
PREFILLED_SYRINGE | INTRAVENOUS | Status: DC | PRN
  Administered 2022-08-22: 50 mg via INTRAVENOUS
  Administered 2022-08-22: 30 mg via INTRAVENOUS
  Administered 2022-08-22: 20 mg via INTRAVENOUS

## 2022-08-22 MED ORDER — DIAZEPAM 5 MG PO TABS: 5.0000 mg | ORAL_TABLET | Freq: Four times a day (QID) | ORAL | Status: DC | PRN

## 2022-08-22 MED ORDER — VANCOMYCIN HCL 1000 MG IV SOLR
INTRAVENOUS | Status: DC | PRN
  Administered 2022-08-22: 1000 mg via TOPICAL

## 2022-08-22 MED ORDER — THROMBIN 5000 UNITS EX SOLR
CUTANEOUS | Status: AC
  Filled 2022-08-22: qty 5000

## 2022-08-22 MED ORDER — POLYETHYLENE GLYCOL 3350 17 G PO PACK
17.0000 g | PACK | Freq: Two times a day (BID) | ORAL | Status: AC
  Administered 2022-08-22 – 2022-08-23 (×3): 17 g via ORAL
  Filled 2022-08-22 (×2): qty 1

## 2022-08-22 MED ORDER — SUCCINYLCHOLINE CHLORIDE 200 MG/10ML IV SOSY
PREFILLED_SYRINGE | INTRAVENOUS | Status: DC | PRN
  Administered 2022-08-22: 160 mg via INTRAVENOUS

## 2022-08-22 MED ORDER — MENTHOL 3 MG MT LOZG: 1.0000 | LOZENGE | OROMUCOSAL | Status: DC | PRN

## 2022-08-22 MED ORDER — BISACODYL 10 MG RE SUPP: 10.0000 mg | Freq: Every day | RECTAL | Status: DC | PRN

## 2022-08-22 MED ORDER — ONDANSETRON HCL 4 MG/2ML IJ SOLN
INTRAMUSCULAR | Status: DC | PRN
  Administered 2022-08-22: 4 mg via INTRAVENOUS

## 2022-08-22 MED ORDER — DEXAMETHASONE SODIUM PHOSPHATE 10 MG/ML IJ SOLN
INTRAMUSCULAR | Status: DC | PRN
  Administered 2022-08-22: 10 mg via INTRAVENOUS

## 2022-08-22 MED ORDER — VANCOMYCIN HCL 1000 MG IV SOLR
INTRAVENOUS | Status: AC
  Filled 2022-08-22: qty 20

## 2022-08-22 MED ORDER — ONDANSETRON HCL 4 MG PO TABS: 4.0000 mg | ORAL_TABLET | Freq: Four times a day (QID) | ORAL | Status: DC | PRN

## 2022-08-22 MED ORDER — DEXTROSE 50 % IV SOLN
12.5000 g | INTRAVENOUS | Status: AC
  Administered 2022-08-22: 12.5 g via INTRAVENOUS
  Filled 2022-08-22: qty 50

## 2022-08-22 MED ORDER — SODIUM CHLORIDE 0.9 % IV BOLUS
1000.0000 mL | Freq: Once | INTRAVENOUS | Status: AC
  Administered 2022-08-22: 1000 mL via INTRAVENOUS

## 2022-08-22 MED ORDER — ONDANSETRON HCL 4 MG/2ML IJ SOLN: 4.0000 mg | Freq: Four times a day (QID) | INTRAMUSCULAR | Status: DC | PRN

## 2022-08-22 MED ORDER — CHLORHEXIDINE GLUCONATE 0.12 % MT SOLN
OROMUCOSAL | Status: AC
  Administered 2022-08-22: 15 mL via OROMUCOSAL
  Filled 2022-08-22: qty 15

## 2022-08-22 SURGICAL SUPPLY — 60 items
ADH SKN CLS APL DERMABOND .7 (GAUZE/BANDAGES/DRESSINGS) ×1
APL SKNCLS STERI-STRIP NONHPOA (GAUZE/BANDAGES/DRESSINGS) ×1
BAG COUNTER SPONGE SURGICOUNT (BAG) ×1 IMPLANT
BAG DECANTER FOR FLEXI CONT (MISCELLANEOUS) ×1 IMPLANT
BAG SPNG CNTER NS LX DISP (BAG) ×1
BENZOIN TINCTURE PRP APPL 2/3 (GAUZE/BANDAGES/DRESSINGS) ×1 IMPLANT
BLADE BONE MILL MEDIUM (MISCELLANEOUS) ×1 IMPLANT
BLADE CLIPPER SURG (BLADE) IMPLANT
BUR CUTTER 7.0 ROUND (BURR) IMPLANT
BUR MATCHSTICK NEURO 3.0 LAGG (BURR) ×1 IMPLANT
CAGE EXP CATALYFT 9 (Plate) IMPLANT
CANISTER SUCT 3000ML PPV (MISCELLANEOUS) ×1 IMPLANT
CAP LCK SPNE (Orthopedic Implant) ×4 IMPLANT
CAP LOCK SPINE RADIUS (Orthopedic Implant) IMPLANT
CAP LOCKING (Orthopedic Implant) ×4 IMPLANT
CNTNR URN SCR LID CUP LEK RST (MISCELLANEOUS) ×1 IMPLANT
CONT SPEC 4OZ STRL OR WHT (MISCELLANEOUS) ×1
COVER BACK TABLE 60X90IN (DRAPES) ×1 IMPLANT
DERMABOND ADVANCED .7 DNX12 (GAUZE/BANDAGES/DRESSINGS) ×1 IMPLANT
DRAPE C-ARM 42X72 X-RAY (DRAPES) ×2 IMPLANT
DRAPE HALF SHEET 40X57 (DRAPES) IMPLANT
DRAPE LAPAROTOMY 100X72X124 (DRAPES) ×1 IMPLANT
DRAPE SURG 17X23 STRL (DRAPES) ×4 IMPLANT
DRSG OPSITE POSTOP 4X6 (GAUZE/BANDAGES/DRESSINGS) ×1 IMPLANT
DURAPREP 26ML APPLICATOR (WOUND CARE) ×1 IMPLANT
ELECT REM PT RETURN 9FT ADLT (ELECTROSURGICAL) ×1
ELECTRODE REM PT RTRN 9FT ADLT (ELECTROSURGICAL) ×1 IMPLANT
EVACUATOR 1/8 PVC DRAIN (DRAIN) IMPLANT
GAUZE 4X4 16PLY ~~LOC~~+RFID DBL (SPONGE) IMPLANT
GAUZE SPONGE 4X4 12PLY STRL (GAUZE/BANDAGES/DRESSINGS) IMPLANT
GLOVE BIO SURGEON STRL SZ 6.5 (GLOVE) ×1 IMPLANT
GLOVE BIOGEL PI IND STRL 6.5 (GLOVE) ×1 IMPLANT
GLOVE ECLIPSE 9.0 STRL (GLOVE) ×2 IMPLANT
GLOVE EXAM NITRILE XL STR (GLOVE) IMPLANT
GOWN STRL REUS W/ TWL LRG LVL3 (GOWN DISPOSABLE) IMPLANT
GOWN STRL REUS W/ TWL XL LVL3 (GOWN DISPOSABLE) ×2 IMPLANT
GOWN STRL REUS W/TWL 2XL LVL3 (GOWN DISPOSABLE) IMPLANT
GOWN STRL REUS W/TWL LRG LVL3 (GOWN DISPOSABLE)
GOWN STRL REUS W/TWL XL LVL3 (GOWN DISPOSABLE) ×2
KIT BASIN OR (CUSTOM PROCEDURE TRAY) ×1 IMPLANT
KIT TURNOVER KIT B (KITS) ×1 IMPLANT
MILL MEDIUM DISP (BLADE) ×1 IMPLANT
NEEDLE HYPO 22GX1.5 SAFETY (NEEDLE) ×1 IMPLANT
NS IRRIG 1000ML POUR BTL (IV SOLUTION) ×1 IMPLANT
PACK LAMINECTOMY NEURO (CUSTOM PROCEDURE TRAY) ×1 IMPLANT
PUTTY GRAFTON DBF 6CC W/DELIVE (Putty) IMPLANT
ROD RADIUS 40MM (Neuro Prosthesis/Implant) ×2 IMPLANT
ROD SPNL 40X5.5XNS TI RDS (Neuro Prosthesis/Implant) IMPLANT
SCREW 5.75X40M (Screw) IMPLANT
SPIKE FLUID TRANSFER (MISCELLANEOUS) ×1 IMPLANT
SPONGE SURGIFOAM ABS GEL 100 (HEMOSTASIS) ×1 IMPLANT
STRIP CLOSURE SKIN 1/2X4 (GAUZE/BANDAGES/DRESSINGS) ×2 IMPLANT
SUT VIC AB 0 CT1 18XCR BRD8 (SUTURE) ×2 IMPLANT
SUT VIC AB 0 CT1 8-18 (SUTURE) ×1
SUT VIC AB 2-0 CT1 18 (SUTURE) ×1 IMPLANT
SUT VIC AB 3-0 SH 8-18 (SUTURE) ×2 IMPLANT
TOWEL GREEN STERILE (TOWEL DISPOSABLE) ×1 IMPLANT
TOWEL GREEN STERILE FF (TOWEL DISPOSABLE) ×1 IMPLANT
TRAY FOLEY MTR SLVR 16FR STAT (SET/KITS/TRAYS/PACK) ×1 IMPLANT
WATER STERILE IRR 1000ML POUR (IV SOLUTION) ×1 IMPLANT

## 2022-08-22 NOTE — Transfer of Care (Signed)
Immediate Anesthesia Transfer of Care Note  Patient: Kara Kelley  Procedure(s) Performed: Lumbar three-four Posterior Lumbar Interbody Fusion with autograft and pedicle screw fixation (Back)  Patient Location: PACU  Anesthesia Type:General  Level of Consciousness: awake and drowsy  Airway & Oxygen Therapy: Patient Spontanous Breathing  Post-op Assessment: Report given to RN and Post -op Vital signs reviewed and stable  Post vital signs: Reviewed and stable  Last Vitals:  Vitals Value Taken Time  BP 140/81 08/22/22 1800  Temp    Pulse 99 08/22/22 1805  Resp 22 08/22/22 1805  SpO2 96 % 08/22/22 1805  Vitals shown include unvalidated device data.  Last Pain:  Vitals:   08/22/22 1404  TempSrc: Oral  PainSc:       Patients Stated Pain Goal: 0 (08/20/22 2330)  Complications: No notable events documented.

## 2022-08-22 NOTE — Progress Notes (Addendum)
[facility]  PROGRESS NOTE  Kara Kelley CBJ:[id number] DOB: 12/31/1955 DOA: 08/19/2022 PCP: Rema Fendt, NP   Brief HPI:   66 y.o. female with medical history significant of hypertension, hyperlipidemia, moderate persistent asthma, DM type II, hyperthyroidism, GERD, and polysubstance abuse   who presents with complaints of leg weakness after having repeated falls    She has had prior C4-C7 ACDF in January 2023 by Dr. Dutch Quint and prior L4-S1 decompression and fusion possibly back in 2018   MRI of the lumbar spine noted decompression and fusion changes at L4-L5 and L5-S1 with no adverse features, and progressed severe segment disease at L3 with new or increased moderate sized broad-based leftward disc protrusion contributing to moderate to severe spinal stenosis, severe left foraminal and severe left lateral recess stenosis, and bladder distention with over 525 mL of urine present.  Neurosurgery had been consulted recommending steroids, PT, and pain control   MRI of thoracic spine obtained, was unremarkable    Subjective   Still complaining of right hip pain   Assessment/Plan:    Bilateral lower extremity weakness -MRI lumbar spine noted decompression and fusion changes at L4-5 and L5-S1; progressed severe segment disease at L3 -MRI of thoracic spine was unremarkable -Neurosurgery following, neurosurgery is considering surgical intervention in the form of extension decompression and fusion at L3-4 pending her progression. For surgery today per patient.  Right hip pain -X-ray of the pelvis and right hip obtained today shows question irregularity of inferior pubic ramus fracture CT of the head showed no acute bony abnormalities no right inferior pubic ramus fracture. Started on a bowel regimen.  Acute kidney injury With a baseline creatinine of less than 1 on admission 1.3. Creatinine is slowly improving continue IV fluid hydration recheck basic metabolic panel in the  morning.  Hyperkalemia: On admission potassium 6 was given 2 doses of Lokelma. Likely due to acute kidney injury due to hypovolemia, severe hyperglycemia in the setting of Decadron in the setting of Aldactone and lisinopril. Aldactone and lisinopril has been held. Potassium this morning is pending. Resume IV fluids and recheck a basic metabolic panel in the morning.  Diabetes mellitus type 2 In the setting of Decadron use. Patient is currently n.p.o. for surgical procedure as per patient. Change sliding scale to resistance.  Essential Hypertension Stable off antihypertensive medication continue to monitor.  Hyperthyroidism -Continue methimazole  GERD -Continue pharmacy substitution of Protonix for omeprazole   History of polysubstance abuse Patient reports that she will be 4 years in remission from alcohol, tobacco, and illicit drug use in January.  UDS was positive for opiates.   Medications     ARIPiprazole  10 mg Oral Daily   atorvastatin  10 mg Oral Daily   buPROPion  300 mg Oral Daily   dexamethasone (DECADRON) injection  4 mg Intravenous Q6H   ferrous sulfate  325 mg Oral Daily   insulin aspart  0-20 Units Subcutaneous TID WC   insulin aspart  3 Units Subcutaneous TID WC   insulin detemir  15 Units Subcutaneous BID   methimazole  5 mg Oral Daily   pantoprazole  40 mg Oral Daily   sodium zirconium cyclosilicate  5 g Oral BID   traZODone  150 mg Oral QHS   zolpidem  5 mg Oral QHS     Data Reviewed:   CBG:  Recent Labs  Lab 08/21/22 0822 08/21/22 1149 08/21/22 1626 08/21/22 2019 08/22/22 0809  GLUCAP 402* 207* 166* 290* 312*  SpO2: 100 %    Vitals:   08/21/22 1436 08/21/22 1955 08/22/22 0609 08/22/22 0800  BP: 134/77 (!) 133/57 138/75 131/79  Pulse: 95 89  96  Resp: 18 13 18 19   Temp: 97.8 F (36.6 C) 97.6 F (36.4 C)  98.2 F (36.8 C)  TempSrc:  Oral  Oral  SpO2: 100% 96% 98% 100%  Weight:      Height:          Data  Reviewed:  Basic Metabolic Panel: Recent Labs  Lab 08/20/22 0011 08/21/22 0212 08/22/22 0311  NA 133* 135 134*  K 4.6 6.0* 5.1  CL 104 107 110  CO2 20* 15* 17*  GLUCOSE 179* 280* 255*  BUN 28* 28* 33*  CREATININE 1.35* 1.18* 1.16*  CALCIUM 9.1 9.9 9.3     CBC: Recent Labs  Lab 08/20/22 0011 08/21/22 0212 08/22/22 0311  WBC 7.4 7.5 7.7  NEUTROABS 4.0  --   --   HGB 10.5* 11.0* 10.2*  HCT 31.7* 33.9* 29.9*  MCV 71.9* 72.1* 71.2*  PLT 194 185 157     LFT Recent Labs  Lab 08/20/22 0011  AST 22  ALT 18  ALKPHOS 143*  BILITOT 0.3  PROT 7.0  ALBUMIN 4.0      Antibiotics: Anti-infectives (From admission, onward)    None        DVT prophylaxis: Lovenox  Code Status: Full code  Family Communication: No family at bedside   CONSULTS neurosurgery   Objective    Physical Examination:  General exam: In no acute distress. Respiratory system: Good air movement and clear to auscultation. Cardiovascular system: S1 & S2 heard, RRR. No JVD. Gastrointestinal system: Abdomen is nondistended, soft and nontender.  Extremities: No pedal edema. Skin: No rashes, lesions or ulcers Psychiatry: Judgement and insight appear normal. Mood & affect appropriate.  Status is: Inpatient:             13/11/23   [facility] If 7PM-7AM, please contact night-coverage at [email or web address], Office  [phone]   08/22/2022, 10:09 AM  LOS: 1 day

## 2022-08-22 NOTE — Progress Notes (Signed)
Occupational Therapy Treatment Patient Details Name: Kara Kelley MRN: [id number] DOB: 08/10/1956 Today's Date: 08/22/2022   History of present illness The pt is a 66 yo female presenting 11/10 after x3 falls at home due to weakness in BLE. Imaging showed severe stenosis at L3-4 and moderate left and mild right foraminal stenosis at T9-T10 and T10-T11 without cord signal change. CT of pelvis shows questionable nondisplaced right inferior pubic ramus fracture. PMH includes: DM II, hypothyroidism, cervical myelopathy, C4-C7 ACDF in 10/2021, L4-S1 decompression and fusion.   OT comments  Pt to have back surgery today at 3pm. Assisted to reposition in bed with pt requesting to brush her teeth and wash her face, completed with set up at bed level.    Recommendations for follow up therapy are one component of a multi-disciplinary discharge planning process, led by the attending physician.  Recommendations may be updated based on patient status, additional functional criteria and insurance authorization.    Follow Up Recommendations  Acute inpatient rehab (3hours/day)     Assistance Recommended at Discharge Intermittent Supervision/Assistance  Patient can return home with the following  A lot of help with walking and/or transfers;A little help with bathing/dressing/bathroom;Assist for transportation   Equipment Recommendations  BSC/3in1    Recommendations for Other Services      Precautions / Restrictions Precautions Precautions: Fall;Back Restrictions Weight Bearing Restrictions: No       Mobility Bed Mobility Overal bed mobility: Needs Assistance             General bed mobility comments: min assist to pull up in bed in trendelenburg    Transfers                         Balance                                           ADL either performed or assessed with clinical judgement   ADL Overall ADL's : Needs assistance/impaired      Grooming: Wash/dry hands;Wash/dry face;Oral care;Bed level;Set up                                      Extremity/Trunk Assessment              Vision       Perception     Praxis      Cognition Arousal/Alertness: Awake/alert Behavior During Therapy: WFL for tasks assessed/performed Overall Cognitive Status: Within Functional Limits for tasks assessed                                          Exercises      Shoulder Instructions       General Comments      Pertinent Vitals/ Pain       Pain Assessment Pain Assessment: Faces Faces Pain Scale: Hurts even more Pain Location: back Pain Descriptors / Indicators: Grimacing, Guarding, Discomfort Pain Intervention(s): Monitored during session, Premedicated before session, Repositioned  Home Living  Prior Functioning/Environment              Frequency  Min 3X/week        Progress Toward Goals  OT Goals(current goals can now be found in the care plan section)  Progress towards OT goals: Not progressing toward goals - comment  Acute Rehab OT Goals OT Goal Formulation: With patient Time For Goal Achievement: 09/04/22 Potential to Achieve Goals: Good  Plan Discharge plan remains appropriate    Co-evaluation                 AM-PAC OT "6 Clicks" Daily Activity     Outcome Measure   Help from another person eating meals?: None Help from another person taking care of personal grooming?: A Little Help from another person toileting, which includes using toliet, bedpan, or urinal?: A Little Help from another person bathing (including washing, rinsing, drying)?: A Lot Help from another person to put on and taking off regular upper body clothing?: A Little Help from another person to put on and taking off regular lower body clothing?: A Lot 6 Click Score: 17    End of Session    OT Visit Diagnosis: Other  abnormalities of gait and mobility (R26.89);Muscle weakness (generalized) (M62.81)   Activity Tolerance Patient tolerated treatment well   Patient Left in bed;in CPM   Nurse Communication          Time: 0454-0981 OT Time Calculation (min): 10 min  Charges: OT General Charges $OT Visit: 1 Visit OT Treatments $Self Care/Home Management : 8-22 mins  Berna Spare, OTR/L Acute Rehabilitation Services Office: [phone]   Evern Bio 08/22/2022, 2:48 PM

## 2022-08-22 NOTE — Progress Notes (Signed)
Patient admitted with severe back and bilateral lower extremity pain numbness and weakness.  Work-up demonstrates evidence of marked adjacent level stenosis with severe thecal sac and nerve root compression at L3-4.  Patient with progressive weakness and inability to stand or ambulate effectively.  She has minimally responded to conservative measures.  Reviewed patient's MRI scan of her lumbar spine as well as x-rays and CT scans.  These demonstrate evidence of stable and solid appearance for prior decompression and fusion surgery at L4-5 and L5-S1.  She has marked adjacent level degeneration with a broad-based disc herniation worse off to the left side at L3-4.  This causes severe spinal stenosis and severe exiting nerve root compression bilateral L3 and L4 nerve roots.  Made of her lumbar spine appears healthy.  The patient has severe stenosis with severe ongoing pain numbness and weakness which is progressively worsening and becoming debilitating.  Given the degree of stenosis and her severe symptoms I think that is important to move forward urgently with decompression and fusion surgery.  I discussed the risks and benefits involved with bilateral L3-4 decompressive laminotomies and foraminotomies followed by posterior lumbar to body fusion utilizing interbody cages, local harvested autograft, and augmented with posterior LAD arthrodesis utilizing nonsegmental pedicle screw fixation and local autografting.  I discussed the risks involved with surgery including but not limited to risk anesthesia, bleeding, infection, CSF leak, nerve injury, fusion failure, tissue failure, continued pain, not benefit.  Patient has been given the option as questions.  She appears to understand.  She wishes proceed with surgery.

## 2022-08-22 NOTE — Interval H&P Note (Signed)
History and Physical Interval Note:  08/22/2022 2:54 PM  Kara Kelley  has presented today for surgery, with the diagnosis of lum stenosis.  The various methods of treatment have been discussed with the patient and family. After consideration of risks, benefits and other options for treatment, the patient has consented to  Procedure(s) with comments: POSTERIOR LUMBAR FUSION 1 LEVEL (N/A) - L3/4 as a surgical intervention.  The patient's history has been reviewed, patient examined, no change in status, stable for surgery.  I have reviewed the patient's chart and labs.  Questions were answered to the patient's satisfaction.     Kathaleen Maser Nissim Fleischer

## 2022-08-22 NOTE — Anesthesia Preprocedure Evaluation (Signed)
Anesthesia Evaluation  Patient identified by MRN, date of birth, ID band Patient awake    Reviewed: Allergy & Precautions, NPO status , Patient's Chart, lab work & pertinent test results  Airway Mallampati: II       Dental   Pulmonary asthma , COPD, former smoker   breath sounds clear to auscultation       Cardiovascular hypertension,  Rhythm:Regular Rate:Normal     Neuro/Psych  Headaches PSYCHIATRIC DISORDERS       Neuromuscular disease    GI/Hepatic ,GERD  ,,(+) Hepatitis -  Endo/Other  diabetes Hyperthyroidism   Renal/GU Renal disease     Musculoskeletal   Abdominal   Peds  Hematology   Anesthesia Other Findings   Reproductive/Obstetrics                             Anesthesia Physical Anesthesia Plan  ASA: 3  Anesthesia Plan: General   Post-op Pain Management:    Induction: Intravenous  PONV Risk Score and Plan: 3 and Ondansetron, Dexamethasone and Midazolam  Airway Management Planned: Oral ETT  Additional Equipment:   Intra-op Plan:   Post-operative Plan: Possible Post-op intubation/ventilation  Informed Consent: I have reviewed the patients History and Physical, chart, labs and discussed the procedure including the risks, benefits and alternatives for the proposed anesthesia with the patient or authorized representative who has indicated his/her understanding and acceptance.     Dental advisory given  Plan Discussed with: CRNA and Anesthesiologist  Anesthesia Plan Comments:        Anesthesia Quick Evaluation

## 2022-08-22 NOTE — Progress Notes (Signed)
? ?  Inpatient Rehab Admissions Coordinator : ? ?Per therapy recommendations, patient was screened for CIR candidacy by Yonis Carreon RN MSN.  At this time patient appears to be a potential candidate for CIR. I will place a rehab consult per protocol for full assessment. Please call me with any questions. ? ?Anjalina Bergevin RN MSN ?Admissions Coordinator ?[phone] ?  ?

## 2022-08-22 NOTE — Inpatient Diabetes Management (Signed)
Inpatient Diabetes Program Recommendations  AACE/ADA: New Consensus Statement on Inpatient Glycemic Control (2015)  Target Ranges:  Prepandial:   less than 140 mg/dL      Peak postprandial:   less than 180 mg/dL (1-2 hours)      Critically ill patients:  140 - 180 mg/dL   Lab Results  Component Value Date   GLUCAP 204 (H) 08/22/2022   HGBA1C 7.4 (H) 08/20/2022    Review of Glycemic Control  Latest Reference Range & Units 08/21/22 08:22 08/21/22 11:49 08/21/22 16:26 08/21/22 20:19 08/22/22 08:09 08/22/22 11:30  Glucose-Capillary 70 - 99 mg/dL 941 (H) 740 (H) 814 (H) 290 (H) 312 (H) 204 (H)   Diabetes history: DM 2 Outpatient Diabetes medications: Levemir 25 units qhs, Novolog 15 units tid prn for high glucose, Ozempic 1 mg weekly Current orders for Inpatient glycemic control:  Levemir 15 units bid Novolog 0-20 units Q4 hours  Decadron 4 mg Q6  Inpatient Diabetes Program Recommendations:    -  Increase Levemir to 18-20 units bid -  Consider adding Novolog 5 units tid meal coverage if eating >50% of meals  Thanks,  Christena Deem RN, MSN, BC-ADM Inpatient Diabetes Coordinator Team Pager [phone] (8a-5p)

## 2022-08-22 NOTE — Op Note (Signed)
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  L3-4 posterior lumbar interbody fusion utilizing interbody cages, local harvested autograft, and morselized allograft  L3-4 posterior lateral arthrodesis utilizing nonsegmental pedicle screw fixation and local autografting.  Surgeon:Edana Aguado A.Aanvi Voyles, M.D.  Asst. Surgeon: Doran Durand, NP  Anesthesia: General  Indication: 66 year old female status post remote L4-5 and L5-S1 decompression and fusion surgery presents with severe bilateral lower extremity weakness which is left her nonambulatory.  Work-up demonstrates evidence of a broad-based disc herniation at L3-4 with critical spinal stenosis.  Patient has failed conservative management including therapy and IV steroids through the weekend.  She remains unable to stand or ambulate.  She has difficulty with bowel and bladder function.  We decided move forward with decompression and fusion surgery at L3-4 in hopes of improving her symptoms.  Operative note: After induction of anesthesia, patient position prone onto Wilson frame appropriate padded.  Lumbar region prepped and draped sterilely.  Incision made overlying L3-4.  Dissection performed bilaterally.  Retractor placed.  Fluoroscopy used.  Levels confirmed.  Decompressive laminotomies and foraminotomies were performed using high-speed drill and Kerrison rongeurs to remove the inferior two thirds of the lamina of L3, the entire inferior facet and pars interarticularis of L3 bilaterally, the superior facet of L4 was removed bilaterally and the superior aspect the L4 lamina was removed.  Ligament flavum elevated and resected.  Wide decompressive foraminotomies along the course exiting L3  and L4 nerve roots was performed.  Patient had a large disc herniation at L3-4.  This was incised.  Discectomy was then performed without difficulty.  This patient then prepared for interbody fusion.  With a distractor placed patient's right side.  Disc space was then prepared on the left side.  Soft tissue removed in the interspace.  A 9 mm [employer] expandable cage was then impacted in place and expanded.  Distractor removed patient's right side.  Disc base prepared on the right side.  Morselized autograft packed in the interspace.  A second cage was then packed into place and expanded.  Pedicles of L3 and L4 were identified using surface landmarks and intraoperative fluoroscopy.  Superficial bone overlying the pedicle was then removed using high-speed drill.  Each pedicle was then probed using a pedicle all each pedicle all track was then probed and found to be solidly within the bone.  Each pedicle all track was tapped with a screw tap.  Screw tap hole was probed and found to be solidly within the bone.  5.75 mm radius brand screws from [employer] placed bilaterally at L3 and L4.  Final images reveal good position of the cages and hardware proper upper level with normal alignment of the spine.  Wound is then irrigated.  Gelfoam was placed topically for hemostasis after each cage had been packed with demineralized bone fibers.  Short segment titanium rod placed to the screw heads at L3 and L4.  Locking caps placed over the screws.  Locking caps then engaged with construct or compression.  Transverse processes of L3 and L4 decorticated.  Morselized autograft was packed posterior laterally for later fusion.  Vancomycin powder was placed in deep wound space.  Wound is then closed in layers with Vicryl sutures.  Steri-Strips and sterile dressing were applied.  No apparent complications.  Patient tolerated the  procedure well and she returns to the recovery room postop.

## 2022-08-22 NOTE — Brief Op Note (Signed)
08/19/2022 - 08/22/2022  5:42 PM  PATIENT:  Kara Kelley  66 y.o. female  PRE-OPERATIVE DIAGNOSIS:  lumbar stenosis  POST-OPERATIVE DIAGNOSIS:  Lumbar stenosis  PROCEDURE:  Procedure(s): Lumbar three-four Posterior Lumbar Interbody Fusion with autograft and pedicle screw fixation (N/A)  SURGEON:  Surgeon(s) and Role:    Julio Sicks, MD - Primary  PHYSICIAN ASSISTANT:   ASSISTANTSMarland Mcalpine   ANESTHESIA:   general  EBL:  350 mL   BLOOD ADMINISTERED:none  DRAINS: none   LOCAL MEDICATIONS USED:  MARCAINE     SPECIMEN:  No Specimen  DISPOSITION OF SPECIMEN:  N/A  COUNTS:  YES  TOURNIQUET:  * No tourniquets in log *  DICTATION: .Dragon Dictation  PLAN OF CARE: Admit to inpatient   PATIENT DISPOSITION:  PACU - hemodynamically stable.   Delay start of Pharmacological VTE agent (>24hrs) due to surgical blood loss or risk of bleeding: yes

## 2022-08-22 NOTE — Anesthesia Postprocedure Evaluation (Signed)
Anesthesia Post Note  Patient: Kara Kelley  Procedure(s) Performed: Lumbar three-four Posterior Lumbar Interbody Fusion with autograft and pedicle screw fixation (Back)     Patient location during evaluation: PACU Anesthesia Type: General Level of consciousness: awake Pain management: pain level controlled Vital Signs Assessment: post-procedure vital signs reviewed and stable Respiratory status: spontaneous breathing Cardiovascular status: stable Postop Assessment: no apparent nausea or vomiting Anesthetic complications: no   No notable events documented.  Last Vitals:  Vitals:   08/22/22 1815 08/22/22 1830  BP: (!) 151/94 (!) 144/73  Pulse: 98 90  Resp: 19 11  Temp:    SpO2: 96% 96%    Last Pain:  Vitals:   08/22/22 1835  TempSrc:   PainSc: 6                  Jerrick Farve

## 2022-08-22 NOTE — Anesthesia Procedure Notes (Addendum)
Procedure Name: Intubation Date/Time: 08/22/2022 3:16 PM  Performed by: Lelon Perla, CRNAPre-anesthesia Checklist: Patient identified, Emergency Drugs available, Suction available and Patient being monitored Patient Re-evaluated:Patient Re-evaluated prior to induction Oxygen Delivery Method: Circle System Utilized Preoxygenation: Pre-oxygenation with 100% oxygen Induction Type: IV induction and Rapid sequence Laryngoscope Size: Glidescope and 3 Grade View: Grade I Tube type: Oral Tube size: 7.0 mm Number of attempts: 1 Airway Equipment and Method: Stylet and Oral airway Placement Confirmation: ETT inserted through vocal cords under direct vision, positive ETCO2 and breath sounds checked- equal and bilateral Secured at: 21 cm Tube secured with: Tape Dental Injury: Teeth and Oropharynx as per pre-operative assessment  Comments: Elective glidescope s/p ACDF. RSI due to GLP1 agonist. SRNA K Venrick

## 2022-08-23 DIAGNOSIS — W19XXXA Unspecified fall, initial encounter: Secondary | ICD-10-CM | POA: Diagnosis not present

## 2022-08-23 DIAGNOSIS — I10 Essential (primary) hypertension: Secondary | ICD-10-CM | POA: Diagnosis not present

## 2022-08-23 DIAGNOSIS — E1122 Type 2 diabetes mellitus with diabetic chronic kidney disease: Secondary | ICD-10-CM | POA: Diagnosis not present

## 2022-08-23 DIAGNOSIS — M48061 Spinal stenosis, lumbar region without neurogenic claudication: Secondary | ICD-10-CM | POA: Diagnosis not present

## 2022-08-23 DIAGNOSIS — E0590 Thyrotoxicosis, unspecified without thyrotoxic crisis or storm: Secondary | ICD-10-CM

## 2022-08-23 LAB — BASIC METABOLIC PANEL
Anion gap: 12 (ref 5–15)
Anion gap: 11 (ref 5–15)
BUN: 34 mg/dL — ABNORMAL HIGH (ref 8–23)
BUN: 35 mg/dL — ABNORMAL HIGH (ref 8–23)
CO2: 18 mmol/L — ABNORMAL LOW (ref 22–32)
CO2: 16 mmol/L — ABNORMAL LOW (ref 22–32)
Calcium: 8.6 mg/dL — ABNORMAL LOW (ref 8.9–10.3)
Calcium: 8.5 mg/dL — ABNORMAL LOW (ref 8.9–10.3)
Chloride: 103 mmol/L (ref 98–111)
Chloride: 108 mmol/L (ref 98–111)
Creatinine, Ser: 1.39 mg/dL — ABNORMAL HIGH (ref 0.44–1.00)
Creatinine, Ser: 1.32 mg/dL — ABNORMAL HIGH (ref 0.44–1.00)
GFR, Estimated: 42 mL/min — ABNORMAL LOW (ref >60)
GFR, Estimated: 45 mL/min — ABNORMAL LOW (ref >60)
Glucose, Bld: 471 mg/dL — ABNORMAL HIGH (ref 70–99)
Glucose, Bld: 216 mg/dL — ABNORMAL HIGH (ref 70–99)
Potassium: 4.7 mmol/L (ref 3.5–5.1)
Potassium: 4.6 mmol/L (ref 3.5–5.1)
Sodium: 133 mmol/L — ABNORMAL LOW (ref 135–145)
Sodium: 135 mmol/L (ref 135–145)

## 2022-08-23 LAB — GLUCOSE, CAPILLARY
Glucose-Capillary: 153 mg/dL — ABNORMAL HIGH (ref 70–99)
Glucose-Capillary: 162 mg/dL — ABNORMAL HIGH (ref 70–99)
Glucose-Capillary: 166 mg/dL — ABNORMAL HIGH (ref 70–99)
Glucose-Capillary: 168 mg/dL — ABNORMAL HIGH (ref 70–99)
Glucose-Capillary: 172 mg/dL — ABNORMAL HIGH (ref 70–99)
Glucose-Capillary: 183 mg/dL — ABNORMAL HIGH (ref 70–99)
Glucose-Capillary: 462 mg/dL — ABNORMAL HIGH (ref 70–99)

## 2022-08-23 NOTE — Progress Notes (Signed)
This nurse came in to take patient to the bathroom and give her evening meds.  Meds were scanned but not given because patient wanted to go to the bathroom. Pain medicine was not given yet because patient wanted to wait until she was back in bed. This nurse called [facility] to let them know I was putting patient in standby for the bathroom.  Patient was seated on the toilet and this nurse noticed pillow cases was needed for the bed.  I instructed patient to wait on the toilet until I returned.  When I returned to room and opened the bathroom door, patient was off of toilet and was trying to flush toilet and fell on her bottom to the bathroom floor before I could reach her. Witnessed fall. Patient did not hit her head.  Staff was called and vitals were taked and cbg checked.  On call MD notified and family daughter Ms. Neva Seat notified.  Daughter of course very concerned and spoke with her mother on the phone.    Time: 0130 Patient's daughter arrived to unit and is extremely upset regarding fall incident. Requested charge nurse another nurse to care for her mother.

## 2022-08-23 NOTE — Clinical Note (Incomplete)
    OVERNIGHT PROGRESS REPORT  (Remote Coverage Notification) Notified by RN for Witnessed fall.   Patient was placed in bathroom seated on toilet. RN stepped out of bathroom and instructed patient to wait for her return as she straightened her bed and pillows. RN came back to see patient off of toilet trying to flush and fell to her bottom before she could reach her. Witnessed fall. Vitals in computer and cbg. Thank you.  5 mins Hi, ok , do you see any injuries ?  She did not hit her head?  Does she have any complaints ?  4 mins Mov't of extremities are normal or at baseline x 4 ?  2 mins SW Florene Glen, RN No visible injuries, no additional complaints from patient, and patient did not hit her head. Patient is at baseline a/o x4 and she is able to move all extremities.        Chinita Greenland MSNA MSN ACNPC-AG Acute Care Nurse Practitioner [facility]

## 2022-08-23 NOTE — PMR Pre-admission (Signed)
PMR Admission Coordinator Pre-Admission Assessment  Patient: Kara Kelley is an 66 y.o., female MRN: [id number] DOB: 03-Jan-1956 Height: _0  (149.9 cm) Weight: 77.1 kg  Insurance Information HMO:     PPO:      PCP:      IPA:      80/20:      OTHER:  PRIMARY: Medicaid [place] Access      Policy#: [id number]      Subscriber: pt  SADCY Benefits:  Phone #: passport one source online     Name: 11/14 Eff. Date: active     Deduct:       Out of Pocket Max:       Life Max:  CIR: per Medicaid      SNF: per medicaid Outpatient: per medicaid     Co-Pay:  Home Health: per medicaid      Co-Pay:  DME: per medicaid     Co-Pay:  Providers: in network  SECONDARY: none  Financial Counselor:       Phone#:   [employer], [employer]" for patients in Inpatient Rehabilitation Facilities with attached "Privacy Act [place] Records" was provided and verbally reviewed with: N/A  Emergency Contact Information Contact Information     Name Relation Home Work [place] Daughter [phone]  [phone]   Delos Haring Daughter   [phone]   Cromartie,Glinda Sister [phone]  [phone]      Current Medical History  Patient Admitting Diagnosis: Lumbar fusion  History of Present Illness: 66 year old female with history of HTN, HLD, Asthma, type 2 DM, hyperthyroidism, prior ACDF 1/23 and L4-S1 fusion in 2018,GERD and polysubstance abuse. ( 4 years in remission).  Presented on 08/19/22 with complaints of leg weakness after repeated falls. Symptoms began approximately a month ago when she began with issues with her right hip.   Xray of pelvis and right hip showed question irregularity of inferior pubic ramus fracture. MRI imaging of lumbar spine noted decompression and fusion changes at L4-L5 and L5-S1 with no adverse features, and progressed severe segment disease at L3 with new or increased moderate sized broad-based leftward disc protrusion  contributing to moderate to severe spinal stenosis, severe left foraminal and severe left lateral recess stenosis, and bladder distention with over 525 mL of urine present . Neurosurgery consulted. Recommended steroids and pain control.   On 11/13 underwent Lumbar 3-4 lumbar interbody fusion with autograft and pedical screw fixation. Postoperative with pain management needs.   Patient's medical record from [facility] has been reviewed by the rehabilitation admission coordinator and physician.  Past Medical History  Past Medical History:  Diagnosis Date   Anemia    Anxiety    Arthritis    Asthma    Bunion    Callus    Chronic pain    Cocaine abuse ([place])    COPD (chronic obstructive pulmonary disease) (HCC)    Corns and callosities    Degenerative joint disease    Depression    Diabetes mellitus    Endometrial polyp    ETOH abuse    Gall stones    GERD (gastroesophageal reflux disease)    Headache    history of Migraines   Hepatitis C    Hep C   Hyperlipidemia    Hypertension    Insomnia    Spondylolisthesis of lumbar region    Substance abuse ([place])    alcoholism   Tuberculosis 1985   Wears dentures    Wears glasses  Has the patient had major surgery during 100 days prior to admission? Yes  Family History   family history includes Cirrhosis in some other family members; Diabetes in an other family member; Heart disease in her mother.  Current Medications  Current Facility-Administered Medications:    0.9 %  sodium chloride infusion, 250 mL, Intravenous, Continuous, Pool, Henry, MD   acetaminophen (TYLENOL) tablet 650 mg, 650 mg, Oral, Q6H PRN **OR** acetaminophen (TYLENOL) suppository 650 mg, 650 mg, Rectal, Q6H PRN, Earnie Larsson, MD   albuterol (PROVENTIL) (2.5 MG/3ML) 0.083% nebulizer solution 2.5 mg, 2.5 mg, Nebulization, Q6H PRN, Pool, Mallie Mussel, MD   ARIPiprazole (ABILIFY) tablet 10 mg, 10 mg, Oral, Daily, Pool, Mallie Mussel, MD, 10 mg at 08/23/22 0816    atorvastatin (LIPITOR) tablet 10 mg, 10 mg, Oral, Daily, Pool, Mallie Mussel, MD, 10 mg at 08/23/22 0817   bisacodyl (DULCOLAX) suppository 10 mg, 10 mg, Rectal, Daily PRN, Earnie Larsson, MD   buPROPion (WELLBUTRIN XL) 24 hr tablet 300 mg, 300 mg, Oral, Daily, Pool, Mallie Mussel, MD, 300 mg at 08/23/22 0816   diazepam (VALIUM) tablet 5-10 mg, 5-10 mg, Oral, Q6H PRN, Earnie Larsson, MD   ferrous sulfate tablet 325 mg, 325 mg, Oral, Daily, Pool, Mallie Mussel, MD, 325 mg at 08/23/22 0816   fluticasone (FLONASE) 50 MCG/ACT nasal spray 2 spray, 2 spray, Each Nare, PRN, Earnie Larsson, MD   hydrALAZINE (APRESOLINE) injection 10 mg, 10 mg, Intravenous, Q4H PRN, Earnie Larsson, MD   HYDROcodone-acetaminophen (NORCO) 10-325 MG per tablet 1 tablet, 1 tablet, Oral, Q4H PRN, Earnie Larsson, MD   HYDROmorphone (DILAUDID) injection 1 mg, 1 mg, Intravenous, Q2H PRN, Earnie Larsson, MD, 1 mg at 08/23/22 0132   hydrOXYzine (ATARAX) tablet 25 mg, 25 mg, Oral, TID PRN, Earnie Larsson, MD, 25 mg at 08/20/22 2352   insulin aspart (novoLOG) injection 0-20 Units, 0-20 Units, Subcutaneous, Q4H, Pool, Mallie Mussel, MD, 4 Units at 08/23/22 1228   insulin detemir (LEVEMIR) injection 15 Units, 15 Units, Subcutaneous, BID, Pool, Mallie Mussel, MD, 15 Units at 08/23/22 0953   menthol-cetylpyridinium (CEPACOL) lozenge 3 mg, 1 lozenge, Oral, PRN **OR** phenol (CHLORASEPTIC) mouth spray 1 spray, 1 spray, Mouth/Throat, PRN, Pool, Mallie Mussel, MD   methimazole (TAPAZOLE) tablet 5 mg, 5 mg, Oral, Daily, Pool, Mallie Mussel, MD, 5 mg at 08/23/22 0954   morphine (PF) 2 MG/ML injection 2 mg, 2 mg, Intravenous, Q3H PRN, Earnie Larsson, MD, 2 mg at 08/23/22 1126   ondansetron (ZOFRAN) tablet 4 mg, 4 mg, Oral, Q6H PRN **OR** ondansetron (ZOFRAN) injection 4 mg, 4 mg, Intravenous, Q6H PRN, Pool, Mallie Mussel, MD   pantoprazole (PROTONIX) EC tablet 40 mg, 40 mg, Oral, Daily, Pool, Mallie Mussel, MD, 40 mg at 08/23/22 0815   polyethylene glycol (MIRALAX / GLYCOLAX) packet 17 g, 17 g, Oral, BID, Pool, Mallie Mussel, MD, 17 g at 08/23/22  0814   polyethylene glycol (MIRALAX / GLYCOLAX) packet 17 g, 17 g, Oral, Daily PRN, Earnie Larsson, MD   sodium chloride flush (NS) 0.9 % injection 3 mL, 3 mL, Intravenous, Q12H, Pool, Henry, MD, 3 mL at 08/23/22 0841   sodium chloride flush (NS) 0.9 % injection 3 mL, 3 mL, Intravenous, PRN, Earnie Larsson, MD   sodium phosphate (FLEET) 7-19 GM/118ML enema 1 enema, 1 enema, Rectal, Once PRN, Earnie Larsson, MD   traZODone (DESYREL) tablet 150 mg, 150 mg, Oral, QHS, Pool, Mallie Mussel, MD, 150 mg at 08/22/22 2133   zolpidem (AMBIEN) tablet 5 mg, 5 mg, Oral, QHS, Pool, Mallie Mussel, MD, 5 mg at 08/22/22 2133  Patients Current  Diet:  Diet Order             Diet regular Room service appropriate? Yes; Fluid consistency: Thin  Diet effective now                  Precautions / Restrictions Precautions Precautions: Fall, Back Precaution Booklet Issued: Yes (comment) Precaution Comments: reviewed back precautions, proper brace wear, and proper posture Spinal Brace: Lumbar corset, Applied in sitting position (may ambulate to bathroom without) Restrictions Weight Bearing Restrictions: No   Has the patient had 2 or more falls or a fall with injury in the past year? Yes  Prior Activity Level Community (5-7x/wk): Independent, driving  Prior Functional Level Self Care: Did the patient need help bathing, dressing, using the toilet or eating? Independent  Indoor Mobility: Did the patient need assistance with walking from room to room (with or without device)? Independent  Stairs: Did the patient need assistance with internal or external stairs (with or without device)? Independent  Functional Cognition: Did the patient need help planning regular tasks such as shopping or remembering to take medications? Independent  Patient Information Are you of Hispanic, Latino/a,or Spanish origin?: A. No, not of Hispanic, Latino/a, or Spanish origin What is your race?: B. Black or African American Do you need or want an  interpreter to communicate with a doctor or health care staff?: 0. No  Patient's Response To:  Health Literacy and Transportation Is the patient able to respond to health literacy and transportation needs?: Yes Health Literacy - How often do you need to have someone help you when you read instructions, pamphlets, or other written material from your doctor or pharmacy?: Never In the past 12 months, has lack of transportation kept you from medical appointments or from getting medications?: No In the past 12 months, has lack of transportation kept you from meetings, work, or from getting things needed for daily living?: No  Home Assistive Devices / [place]: Cane - single point, Rollator (4 wheels)  Prior Device Use: Indicate devices/aids used by the patient prior to current illness, exacerbation or injury? None of the above  Current Functional Level Cognition  Overall Cognitive Status: Within Functional Limits for tasks assessed Orientation Level: Oriented X4 General Comments: seems mildly slow to process but recently received morphine    Extremity Assessment (includes Sensation/Coordination)  Upper Extremity Assessment: Overall WFL for tasks assessed, Defer to OT evaluation  Lower Extremity Assessment: Defer to PT evaluation RLE Deficits / Details: most significant weakness at ankle DF, hip flexion, and hip abduction (grossly 3+/5), pt denies numbness or tingling in extremity. RLE Sensation: WNL RLE Coordination: decreased fine motor, decreased gross motor    ADLs  Overall ADL's : Needs assistance/impaired Eating/Feeding: Independent, Sitting Grooming: Set up, Bed level, Sitting Upper Body Bathing: Set up, Sitting Lower Body Bathing: Minimal assistance, Moderate assistance, Sit to/from stand Upper Body Dressing : Set up, Sitting Lower Body Dressing: Sit to/from stand, Minimal assistance, Moderate assistance, +2 for safety/equipment Toilet Transfer: Rolling walker (2  wheels), BSC/3in1, Minimal assistance, Ambulation, +2 for physical assistance, +2 for safety/equipment Toileting- Clothing Manipulation and Hygiene: Minimal assistance, Sit to/from stand Tub/Shower Transfer Details (indicate cue type and reason): TBD Functional mobility during ADLs: Minimal assistance, Rolling walker (2 wheels), +2 for physical assistance, +2 for safety/equipment General ADL Comments: Pt seen for OT treatment and re-eval following surgery. Current goals remain appropriate, will focus on PLOC and increased independence with functional mobility and ADL's    Mobility  Overal bed mobility: Needs Assistance Bed Mobility: Rolling, Sidelying to Sit Rolling: Mod assist Sidelying to sit: Max assist Supine to sit: Supervision, HOB elevated General bed mobility comments: vc's for precautions and sequencing. Mod A to bridge knees and complete grasp of far rail. Max A for LE"s off bed and elevation of trunk into sitting. Pt able to scoot to EOB once sitting    Transfers  Overall transfer level: Needs assistance Equipment used: Rolling walker (2 wheels) Transfers: Sit to/from Stand Sit to Stand: Min assist, +2 physical assistance, From elevated surface General transfer comment: pt unable to stand from bed in lowest position but was able to stand from elevated bed with min A +2 for safety, R knee unstable    Ambulation / Gait / Stairs / Wheelchair Mobility  Ambulation/Gait Ambulation/Gait assistance: Mod assist, +2 physical assistance Gait Distance (Feet): 4 Feet Assistive device: Rolling walker (2 wheels) Gait Pattern/deviations: Step-to pattern, Decreased stride length, Decreased step length - right, Decreased dorsiflexion - right, Shuffle, Knees buckling General Gait Details: knees buckling R>L, consistent mod A needed for support even with hands on RW. Buckling worsened with pivot steps Gait velocity: decreased Gait velocity interpretation: <1.31 ft/sec, indicative of household  ambulator    Posture / Balance Balance Overall balance assessment: Needs assistance Sitting-balance support: No upper extremity supported, Feet supported Sitting balance-Leahy Scale: Fair Standing balance support: Reliant on assistive device for balance, Bilateral upper extremity supported Standing balance-Leahy Scale: Poor Standing balance comment: dependent on BUE support and min-modA of 2    Special needs/care consideration    Previous Home Environment  Living Arrangements: Alone  Lives With: Alone Available Help at Discharge:  (3 daughters live local and will provide 24/7) Type of Home: Apartment Home Layout: One level Home Access: Ramped entrance Bathroom Shower/Tub: Chiropodist: Handicapped height Bathroom Accessibility: Yes How Accessible: Accessible via walker Home Care Services: No Additional Comments:  (local daughters work in [employer] information [employer] and can provide 24/7)  Discharge Living Setting Plans for Discharge Living Setting: Apartment Type of Home at Discharge: Apartment Discharge Home Layout: One level Discharge Home Access: [place] entrance Discharge Bathroom Shower/Tub: Tub/shower unit Discharge Bathroom Toilet: Handicapped height Discharge Bathroom Accessibility: Yes How Accessible: Accessible via walker Does the patient have any problems obtaining your medications?: No  Social/Family/Support Systems Patient Roles: Parent Contact Information: Alnisa, daughter Anticipated Caregiver: daughters Anticipated Caregiver's Contact Information: see contacts Ability/Limitations of Caregiver: none Caregiver Availability: 24/7 Discharge Plan Discussed with Primary Caregiver: Yes Is Caregiver In Agreement with Plan?: Yes Does Caregiver/Family have Issues with Lodging/Transportation while Pt is in Rehab?: No  Goals Patient/Family Goal for Rehab: supervision with PT and OT Expected length of stay: ELOS 7 to 10 days Pt/Family Agrees to Admission and  willing to participate: Yes Program Orientation Provided & Reviewed with Pt/Caregiver Including Roles  & Responsibilities: Yes  Decrease burden of Care through IP rehab admission: n/a  Possible need for SNF placement upon discharge: not anticipated  Patient Condition: I have reviewed medical records from [facility], spoken with  patient and daughter. I met with patient at the bedside for inpatient rehabilitation assessment.  Patient will benefit from ongoing PT and OT, can actively participate in 3 hours of therapy a day 5 days of the week, and can make measurable gains during the admission.  Patient will also benefit from the coordinated team approach during an Inpatient Acute Rehabilitation admission.  The patient will receive intensive therapy as well as Rehabilitation physician, nursing,  Education officer, museum, and care management interventions.  Due to bladder management, bowel management, safety, skin/wound care, disease management, medication administration, pain management, and patient education the patient requires 24 hour a day rehabilitation nursing.  The patient is currently *** with mobility and basic ADLs.  Discharge setting and therapy post discharge at home with home health is anticipated.  Patient has agreed to participate in the Acute Inpatient Rehabilitation Program and will admit today.  Preadmission Screen Completed By:  Cleatrice Burke, 08/23/2022 1:05 PM ______________________________________________________________________   Discussed status with Dr. [name] on *** at *** and received approval for admission today.  Admission Coordinator:  Cleatrice Burke, RN, time Marland KitchenSudie Grumbling ***   Assessment/Plan: Diagnosis: Does the need for close, 24 hr/day Medical supervision in concert with the patient's rehab needs make it unreasonable for this patient to be served in a less intensive setting? {yes_no_potentially:3041433} Co-Morbidities requiring supervision/potential  complications: *** Due to {due RJ:1884166}, does the patient require 24 hr/day rehab nursing? {yes_no_potentially:3041433} Does the patient require coordinated care of a physician, rehab nurse, PT, OT, and SLP to address physical and functional deficits in the context of the above medical diagnosis(es)? {yes_no_potentially:3041433} Addressing deficits in the following areas: {deficits:3041436} Can the patient actively participate in an intensive therapy program of at least 3 hrs of therapy 5 days a week? {yes_no_potentially:3041433} The potential for patient to make measurable gains while on inpatient rehab is {potential:3041437} Anticipated functional outcomes upon discharge from inpatient rehab: {functional outcomes:304600100} PT, {functional outcomes:304600100} OT, {functional outcomes:304600100} SLP Estimated rehab length of stay to reach the above functional goals is: *** Anticipated discharge destination: {anticipated dc setting:21604} 10. Overall Rehab/Functional Prognosis: {potential:3041437}   MD Signature: ***

## 2022-08-23 NOTE — Progress Notes (Signed)
Orthopedic Tech Progress Note Patient Details:  Kara Kelley 1955/12/01 [id number]  Ortho Devices Type of Ortho Device: Lumbar corsett Ortho Device/Splint Interventions: Ordered, Application, Adjustment  I sized the brace to the patient Post Interventions Patient Tolerated: Well Instructions Provided: Care of device, Adjustment of device  Trinna Post 08/23/2022, 5:12 AM

## 2022-08-23 NOTE — Progress Notes (Signed)
Postop day 1.  Patient with the expected amount of lower back pain.  Her radicular pain is improved.  Still feels generalized weakness in both lower extremities.  She is afebrile.  Her vital signs are stable.  Her urine output is good.  She is awake and alert.  She is sitting up in a chair looking reasonably comfortable.  Motor examination reveals intact motor strength to direct testing.  Patient with somewhat poor effort however.  Sensory examination nonfocal.  Wound clean and dry.  Chest and abdomen stable.  Overall progressing about as would be expected following lumbar decompression and fusion surgery.  Continue efforts at therapy and rehabilitation.

## 2022-08-23 NOTE — Progress Notes (Signed)
Physical Therapy Treatment and Re-evaluation since surgery Patient Details Name: Kara Kelley MRN: [id number] DOB: Nov 02, 1955 Today's Date: 08/23/2022   History of Present Illness The pt is a 66 yo female presenting 11/10 after x3 falls at home due to weakness in BLE. Pt underwent L3-4 PLIF on 08/22/22.  PMH includes: DM II, hypothyroidism, cervical myelopathy, C4-C7 ACDF in 10/2021, L4-S1 decompression and fusion, anxiety, OA, COPD, depression, HTN, TB.    PT Comments    Pt had L3-4 PLIF yesterday, function largely unchanged with continued buckling of R knee> L knee in standing so session performed as treatment with OT cotreating. Pt requires mod A to come to SL on R, max A for LE's off EOB and elevation of trunk into sitting. Pt taught back precautions as well as assisted with application of brace which she did feel helped relieve some back discomfort. Pt stood from elevated surface with min A +2. With mobility, pt with B knees buckling R>L, especially when pivoting and stepping bkwds to recliner. Continue to recommend AIR level therapy before return home. Goals set on initial evaluation still appropriate. PT will continue to follow.    Recommendations for follow up therapy are one component of a multi-disciplinary discharge planning process, led by the attending physician.  Recommendations may be updated based on patient status, additional functional criteria and insurance authorization.  Follow Up Recommendations  Acute inpatient rehab (3hours/day)     Assistance Recommended at Discharge Frequent or constant Supervision/Assistance  Patient can return home with the following A lot of help with bathing/dressing/bathroom;Assistance with cooking/housework;Direct supervision/assist for medications management;Direct supervision/assist for financial management;Assist for transportation;Help with stairs or ramp for entrance;Two people to help with walking and/or transfers   Equipment  Recommendations  Wheelchair (measurements PT);Wheelchair cushion (measurements PT);Rolling walker (2 wheels)    Recommendations for Other Services Rehab consult     Precautions / Restrictions Precautions Precautions: Fall;Back Precaution Booklet Issued: Yes (comment) Precaution Comments: reviewed back precautions, proper brace wear, and proper posture Required Braces or Orthoses: Spinal Brace Spinal Brace: Lumbar corset;Applied in sitting position (may ambulate to bathroom without) Restrictions Weight Bearing Restrictions: No     Mobility  Bed Mobility Overal bed mobility: Needs Assistance Bed Mobility: Rolling, Sidelying to Sit Rolling: Mod assist Sidelying to sit: Max assist       General bed mobility comments: vc's for precautions and sequencing. Mod A to bridge knees and complete grasp of far rail. Max A for LE"s off bed and elevation of trunk into sitting. Pt able to scoot to EOB once sitting    Transfers Overall transfer level: Needs assistance Equipment used: Rolling walker (2 wheels) Transfers: Sit to/from Stand Sit to Stand: Min assist, +2 physical assistance, From elevated surface           General transfer comment: pt unable to stand from bed in lowest position but was able to stand from elevated bed with min A +2 for safety, R knee unstable    Ambulation/Gait Ambulation/Gait assistance: Mod assist, +2 physical assistance Gait Distance (Feet): 4 Feet Assistive device: Rolling walker (2 wheels) Gait Pattern/deviations: Step-to pattern, Decreased stride length, Decreased step length - right, Decreased dorsiflexion - right, Shuffle, Knees buckling Gait velocity: decreased Gait velocity interpretation: <1.31 ft/sec, indicative of household ambulator   General Gait Details: knees buckling R>L, consistent mod A needed for support even with hands on RW. Buckling worsened with pivot steps   Stairs  Wheelchair Mobility    Modified Rankin  (Stroke Patients Only)       Balance Overall balance assessment: Needs assistance Sitting-balance support: No upper extremity supported, Feet supported Sitting balance-Leahy Scale: Fair     Standing balance support: Reliant on assistive device for balance, Bilateral upper extremity supported Standing balance-Leahy Scale: Poor Standing balance comment: dependent on BUE support and min-modA of 2                            Cognition Arousal/Alertness: Awake/alert Behavior During Therapy: WFL for tasks assessed/performed Overall Cognitive Status: Within Functional Limits for tasks assessed                                 General Comments: seems mildly slow to process but recently received morphine        Exercises General Exercises - Lower Extremity Ankle Circles/Pumps: AROM, Both, 10 reps, Seated, Supine Long Arc Quad: AROM, Right, 10 reps, Limitations Long Arc Quad Limitations: unable to extend through full ROM due to weakness    General Comments General comments (skin integrity, edema, etc.): VSS on RA. Daughter present in room. Pt left in recliner but many pillows placed to allow for upright sitting with pt's feet on floor at front edge of chair. Spoke with pt, daughter, and RN about assisting pt's hips back and feet up if pt began to fall asleep from pain meds      Pertinent Vitals/Pain Pain Assessment Pain Assessment: Faces Faces Pain Scale: Hurts even more Pain Location: back Pain Descriptors / Indicators: Grimacing, Guarding, Discomfort, Operative site guarding Pain Intervention(s): Limited activity within patient's tolerance, Monitored during session, Premedicated before session    Home Living                          Prior Function            PT Goals (current goals can now be found in the care plan section) Acute Rehab PT Goals Patient Stated Goal: return to walking independently PT Goal Formulation: With patient Time For  Goal Achievement: 09/04/22 Potential to Achieve Goals: Good Progress towards PT goals: Progressing toward goals    Frequency    Min 5X/week      PT Plan Frequency needs to be updated    Co-evaluation PT/OT/SLP Co-Evaluation/Treatment: Yes Reason for Co-Treatment: Complexity of the patient's impairments (multi-system involvement);For patient/therapist safety          AM-PAC PT "6 Clicks" Mobility   Outcome Measure  Help needed turning from your back to your side while in a flat bed without using bedrails?: A Little Help needed moving from lying on your back to sitting on the side of a flat bed without using bedrails?: A Lot Help needed moving to and from a bed to a chair (including a wheelchair)?: Total Help needed standing up from a chair using your arms (e.g., wheelchair or bedside chair)?: A Lot Help needed to walk in hospital room?: Total Help needed climbing 3-5 steps with a railing? : Total 6 Click Score: 10    End of Session Equipment Utilized During Treatment: Gait belt;Back brace Activity Tolerance: Patient tolerated treatment well Patient left: in chair;with call bell/phone within reach;with chair alarm set;with family/visitor present Nurse Communication: Mobility status PT Visit Diagnosis: Other abnormalities of gait and mobility (R26.89);Muscle weakness (generalized) (M62.81);Repeated falls (  R29.6);Pain Pain - Right/Left: Right Pain - part of body: Leg     Time: 1610-9604 PT Time Calculation (min) (ACUTE ONLY): 23 min  Charges:                        Lyanne Co, PT  Acute Rehab Services Secure chat preferred Office [phone]    Lawana Chambers Rayni Nemitz 08/23/2022, 10:47 AM

## 2022-08-23 NOTE — Progress Notes (Signed)
Occupational Therapy Treatment & Re-Evaluation Patient Details Name: Kara Kelley MRN: [id number] DOB: 10-30-1955 Today's Date: 08/23/2022   History of present illness The pt is a 65 yo female presenting 11/10 after x3 falls at home due to weakness in BLE. Pt underwent L3-4 PLIF on 08/22/22.  PMH includes: DM II, hypothyroidism, cervical myelopathy, C4-C7 ACDF in 10/2021, L4-S1 decompression and fusion, anxiety, OA, COPD, depression, HTN, TB.   OT comments  Pt had L3-4 PLIF yesterday, function largely unchanged with buckling of R knee >L knee in standing so session performed as treatment with PT co-treating. Pt was mod A to come to sidelying on R, max A to advance LE's off EOB and elevation of trunk into sitting. Verbally reviewed back precautions as well as assisted with application of brace. Pt performed sit to stand with min A +2. During ambulation to chair, pt was noted to have buckling of B knees (R>L), especially when pivoting and stepping backward to recliner. Continue to recommend AIR level OT before return home. Goals set on initial evaluation still appropriate. OT will continue to follow acutely with focus on maximizing independence with ADL's, self care and functional transfers.   Recommendations for follow up therapy are one component of a multi-disciplinary discharge planning process, led by the attending physician.  Recommendations may be updated based on patient status, additional functional criteria and insurance authorization.    Follow Up Recommendations  Acute inpatient rehab (3hours/day)     Assistance Recommended at Discharge Intermittent Supervision/Assistance  Patient can return home with the following  A lot of help with walking and/or transfers;A little help with bathing/dressing/bathroom;Assist for transportation   Equipment Recommendations  BSC/3in1    Recommendations for Other Services      Precautions / Restrictions Precautions Precautions:  Fall;Back Precaution Booklet Issued: Yes (comment) Precaution Comments: reviewed back precautions, proper brace wear, and proper posture Required Braces or Orthoses: Spinal Brace Spinal Brace: Lumbar corset;Applied in sitting position (may ambulate to bathroom without) Restrictions Weight Bearing Restrictions: No       Mobility Bed Mobility Overal bed mobility: Needs Assistance Bed Mobility: Rolling, Sidelying to Sit Rolling: Mod assist Sidelying to sit: Max assist       General bed mobility comments: vc's for precautions and sequencing. Mod A to bridge knees and complete grasp of far rail. Max A for LE"s off bed and elevation of trunk into sitting. Pt able to scoot to EOB once sitting    Transfers Overall transfer level: Needs assistance Equipment used: Rolling walker (2 wheels) Transfers: Sit to/from Stand Sit to Stand: Min assist, +2 physical assistance, From elevated surface     General transfer comment: pt unable to stand from bed in lowest position but was able to stand from elevated bed with min A +2 for safety, R knee unstable     Balance Overall balance assessment: Needs assistance Sitting-balance support: No upper extremity supported, Feet supported Sitting balance-Leahy Scale: Fair     Standing balance support: Reliant on assistive device for balance, Bilateral upper extremity supported Standing balance-Leahy Scale: Poor Standing balance comment: dependent on BUE support and min-modA of 2       ADL either performed or assessed with clinical judgement   ADL Overall ADL's : Needs assistance/impaired Eating/Feeding: Independent;Sitting   Grooming: Set up;Bed level;Sitting   Upper Body Bathing: Set up;Sitting   Lower Body Bathing: Minimal assistance;Moderate assistance;Sit to/from stand   Upper Body Dressing : Set up;Sitting   Lower Body Dressing: Sit to/from stand;Minimal assistance;Moderate  assistance;+2 for safety/equipment   Toilet Transfer:  Rolling walker (2 wheels);BSC/3in1;Minimal assistance;Ambulation;+2 for physical assistance;+2 for safety/equipment   Toileting- Clothing Manipulation and Hygiene: Minimal assistance;Sit to/from Nurse, children's Details (indicate cue type and reason): TBD Functional mobility during ADLs: Minimal assistance;Rolling walker (2 wheels);+2 for physical assistance;+2 for safety/equipment General ADL Comments: Pt seen for OT treatment and re-eval following surgery. Current goals remain appropriate, will focus on PLOC and increased independence with functional mobility and ADL's    Extremity/Trunk Assessment Upper Extremity Assessment Upper Extremity Assessment: Overall WFL for tasks assessed;Defer to OT evaluation   Lower Extremity Assessment Lower Extremity Assessment: Defer to PT evaluation        Vision Baseline Vision/History: 0 No visual deficits Ability to See in Adequate Light: 0 Adequate Patient Visual Report: No change from baseline Vision Assessment?: No apparent visual deficits          Cognition Arousal/Alertness: Awake/alert Behavior During Therapy: WFL for tasks assessed/performed Overall Cognitive Status: Within Functional Limits for tasks assessed     General Comments: seems mildly slow to process but recently received morphine              General Comments VSS on RA. Daughter present in room. Pt left in recliner but many pillows placed to allow for upright sitting with pt's feet on floor at front edge of chair. Spoke with pt, daughter, and RN about assisting pt's hips back and feet up if pt began to fall asleep from pain meds    Pertinent Vitals/ Pain       Pain Assessment Pain Assessment: Faces Faces Pain Scale: Hurts even more Pain Location: back Pain Descriptors / Indicators: Grimacing, Guarding, Discomfort, Operative site guarding Pain Intervention(s): Limited activity within patient's tolerance, Monitored during session, Premedicated before  session, Repositioned  Home Living  Please refer to initial OT Eval for details     Prior Functioning/Environment   Please refer to OT initial Eval for details   Frequency  Min 3X/week        Progress Toward Goals  OT Goals(current goals can now be found in the care plan section)  Progress towards OT goals: Progressing toward goals  Acute Rehab OT Goals Patient Stated Goal: Return to walking and increased independence OT Goal Formulation: With patient Time For Goal Achievement: 09/04/22 Potential to Achieve Goals: Good  Plan Discharge plan remains appropriate    Co-evaluation      Reason for Co-Treatment: Complexity of the patient's impairments (multi-system involvement);For patient/therapist safety      AM-PAC OT "6 Clicks" Daily Activity     Outcome Measure   Help from another person eating meals?: None Help from another person taking care of personal grooming?: A Little Help from another person toileting, which includes using toliet, bedpan, or urinal?: A Little Help from another person bathing (including washing, rinsing, drying)?: A Lot Help from another person to put on and taking off regular upper body clothing?: A Little Help from another person to put on and taking off regular lower body clothing?: A Lot 6 Click Score: 17    End of Session Equipment Utilized During Treatment: Rolling walker (2 wheels);Gait belt  OT Visit Diagnosis: Other abnormalities of gait and mobility (R26.89);Muscle weakness (generalized) (M62.81)   Activity Tolerance Patient tolerated treatment well   Patient Left in chair;with call bell/phone within reach;with chair alarm set;with family/visitor present   Nurse Communication Mobility status;Other (comment) (Pt up in chair, assist needed to reposition vs getting  back to bed secondary to back precuations. Lumbar corset in sitting)        Time: [id number] OT Time Calculation (min): 20 min  Charges: OT General Charges $OT  Visit: 1 Visit OT Treatments $Self Care/Home Management : 8-22 mins   Almyra Deforest, OTR/L 08/23/2022, 12:38 PM

## 2022-08-23 NOTE — Progress Notes (Signed)
    Inpatient Rehabilitation Admissions Coordinator   Met with patient and daughter at bedside and daughter by conference phone for rehab assessment. We discussed goals and expectations of a possible CIR admit. They prefer CIR for rehab. Family can provide expected caregiver support that is recommended. I await improved pain management before pursuing admit.  Please call me with any questions.   Danne Baxter, RN, MSN Rehab Admissions Coordinator [phone]

## 2022-08-23 NOTE — Progress Notes (Signed)
[facility]                                                                               Chinenye Katzenberger, is a 66 y.o. female, DOB - [birth year], YYT:[id number] Admit date - 08/19/2022    Outpatient Primary MD for the patient is Rema Fendt, NP  LOS - 2  days    Brief summary    66 y.o. female with medical history significant of hypertension, hyperlipidemia, moderate persistent asthma, DM type II, hyperthyroidism, GERD, and polysubstance abuse   who presents with complaints of leg weakness after having repeated falls     She has had prior C4-C7 ACDF in January 2023 by Dr. Dutch Quint and prior L4-S1 decompression and fusion possibly back in 2018    MRI of the lumbar spine noted decompression and fusion changes at L4-L5 and L5-S1 with no adverse features, and progressed severe segment disease at L3 with new or increased moderate sized broad-based leftward disc protrusion contributing to moderate to severe spinal stenosis, severe left foraminal and severe left lateral recess stenosis, and bladder distention with over 525 mL of urine present.  Neurosurgery had been consulted recommending steroids, PT, and pain control    MRI of thoracic spine obtained, was unremarkable   Assessment & Plan    Assessment and Plan:  Bilateral lower extremity weakness -MRI lumbar spine noted decompression and fusion changes at L4-5 and L5-S1; progressed severe segment disease at L3 -MRI of thoracic spine was unremarkable -Neurosurgery following, neurosurgery is considering surgical intervention in the form of extension decompression and fusion at L3-4 pending her progression. - patient underwent:   Bilateral L3-4 decompressive laminotomies and foraminotomies, more than would be required for simple interbody fusion alone.  L3-4 posterior lumbar interbody fusion utilizing interbody cages, local harvested autograft, and morselized allograft L3-4 posterior lateral arthrodesis utilizing nonsegmental  pedicle screw fixation and local autografting. Therapy eval recommending CIR.  CIR eval pending.    Right hip pain -X-ray of the pelvis and right hip obtained today shows question irregularity of inferior pubic ramus fracture CT of the head showed no acute bony abnormalities no right inferior pubic ramus fracture. Started on a bowel regimen.   Acute kidney injury Creatinine on admission is 1.3. suspect from pre renal causes   Hyperkalemia: On admission potassium 6 was given 2 doses of Lokelma. Aldactone and lisinopril has been held. Resolved.  Repeat K is 4.6 %.     Diabetes mellitus type 2 In the setting of Decadron use. CBG (last 3)  Recent Labs    08/23/22 0404 08/23/22 0734 08/23/22 1213  GLUCAP 162* 166* 183*   Resume SSI.  Hemoglobin A1c is 7.4%    Essential Hypertension Well controlled.    Hyperthyroidism -Continue methimazole   GERD Stable, on PPI.   Anemia of chronic disease:  Continue to monitor.     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  UDS was positive for opiates. Estimated body mass index is 34.34 kg/m as calculated from the following:   Height as of this encounter: 4\' 11"  (1.499 m).  Weight as of this encounter: 77.1 kg.  Code Status: full code.  DVT Prophylaxis:  SCD's Start: 08/22/22 2001   Level of Care: Level of care: Telemetry Medical Family Communication: Updated patient's family at bedside.   Disposition Plan:     Remains inpatient appropriate:  CIR placement.   Procedures:  None.   Consultants:   Dr Jordan Likes with neurosurgery  Antimicrobials:   Anti-infectives (From admission, onward)    Start     Dose/Rate Route Frequency Ordered Stop   08/22/22 2300  ceFAZolin (ANCEF) IVPB 1 g/50 mL premix        1 g 100 mL/hr over 30 Minutes Intravenous Every 8 hours 08/22/22 2001 08/23/22 0718   08/22/22 1601  vancomycin (VANCOCIN)  powder  Status:  Discontinued          As needed 08/22/22 1601 08/22/22 1754   08/22/22 1433  ceFAZolin (ANCEF) IVPB 2g/100 mL premix        2 g 200 mL/hr over 30 Minutes Intravenous 30 min pre-op 08/22/22 1433 08/22/22 1516   08/22/22 1420  ceFAZolin (ANCEF) 2-4 GM/100ML-% IVPB       Note to Pharmacy: Samuella Cota O: cabinet override      08/22/22 1420 08/22/22 1519        Medications  Scheduled Meds:  ARIPiprazole  10 mg Oral Daily   atorvastatin  10 mg Oral Daily   buPROPion  300 mg Oral Daily   ferrous sulfate  325 mg Oral Daily   insulin aspart  0-20 Units Subcutaneous Q4H   insulin detemir  15 Units Subcutaneous BID   methimazole  5 mg Oral Daily   pantoprazole  40 mg Oral Daily   polyethylene glycol  17 g Oral BID   sodium chloride flush  3 mL Intravenous Q12H   traZODone  150 mg Oral QHS   zolpidem  5 mg Oral QHS   Continuous Infusions:  sodium chloride     PRN Meds:.acetaminophen **OR** acetaminophen, albuterol, bisacodyl, diazepam, fluticasone, hydrALAZINE, HYDROcodone-acetaminophen, HYDROmorphone (DILAUDID) injection, hydrOXYzine, menthol-cetylpyridinium **OR** phenol, morphine injection, ondansetron **OR** ondansetron (ZOFRAN) IV, polyethylene glycol, sodium chloride flush, sodium phosphate    Subjective:   Kennidee Heyne was seen and examined today.  Some back pain.   Objective:   Vitals:   08/22/22 2013 08/23/22 0401 08/23/22 0736 08/23/22 1243  BP: 127/81 (!) 108/59 (!) 114/45 128/74  Pulse: 92 93 94 99  Resp: 15 (!) 25 20 20   Temp:   98 F (36.7 C) 97.6 F (36.4 C)  TempSrc:    Oral  SpO2: 94% 97%    Weight:      Height:        Intake/Output Summary (Last 24 hours) at 08/23/2022 1406 Last data filed at 08/23/2022 1217 Gross per 24 hour  Intake 2712.84 ml  Output 586 ml  Net 2126.84 ml   Filed Weights   08/20/22 1432  Weight: 77.1 kg     Exam General exam: Appears calm and comfortable  Respiratory system: Clear to auscultation.  Respiratory effort normal. Cardiovascular system: S1 & S2 heard, RRR. No JVD,  Gastrointestinal system: Abdomen is nondistended, soft and nontender.  Central nervous system: Alert and oriented. No focal neurological deficits. Extremities: Symmetric 5 x 5 power. Skin: No rashes, lesions or ulcers Psychiatry: Mood & affect appropriate.     Data Reviewed:  I have personally reviewed following labs and imaging studies   CBC Lab Results  Component Value Date   WBC 7.7 08/22/2022  RBC 4.20 08/22/2022   HGB 10.2 (L) 08/22/2022   HCT 29.9 (L) 08/22/2022   MCV 71.2 (L) 08/22/2022   MCH 24.3 (L) 08/22/2022   PLT 157 08/22/2022   MCHC 34.1 08/22/2022   RDW 15.2 08/22/2022   LYMPHSABS 2.6 08/20/2022   MONOABS 0.4 08/20/2022   EOSABS 0.2 08/20/2022   BASOSABS 0.0 08/20/2022     Last metabolic panel Lab Results  Component Value Date   NA 135 08/23/2022   K 4.6 08/23/2022   CL 108 08/23/2022   CO2 16 (L) 08/23/2022   BUN 35 (H) 08/23/2022   CREATININE 1.32 (H) 08/23/2022   GLUCOSE 216 (H) 08/23/2022   GFRNONAA 45 (L) 08/23/2022   GFRAA 55 (L) 11/26/2020   CALCIUM 8.5 (L) 08/23/2022   PHOS 2.5 07/18/2017   PROT 7.0 08/20/2022   ALBUMIN 4.0 08/20/2022   LABGLOB 2.4 05/13/2022   AGRATIO 1.8 05/13/2022   BILITOT 0.3 08/20/2022   ALKPHOS 143 (H) 08/20/2022   AST 22 08/20/2022   ALT 18 08/20/2022   ANIONGAP 11 08/23/2022    CBG (last 3)  Recent Labs    08/23/22 0404 08/23/22 0734 08/23/22 1213  GLUCAP 162* 166* 183*      Coagulation Profile: Recent Labs  Lab 08/20/22 0011  INR 1.0     Radiology Studies: DG Lumbar Spine 2-3 Views  Result Date: 08/22/2022 CLINICAL DATA:  [id number] Surgery, elective [id number] EXAM: LUMBAR SPINE - 2-3 VIEW COMPARISON:  None Available. FINDINGS: Multiple intraoperative fluoroscopic images during L3-L4 posterior and interbody fusion. Posterior fusion rod is not yet in place. Unchanged L5-S1 anterior posterior fusion and interbody fusion  at L4-L5. IMPRESSION: Intraoperative images during L3-L4 anterior posterior fusion. Electronically Signed   By: Caprice RenshawJacob  Kahn M.D.   On: 08/22/2022 19:23   DG C-Arm 1-60 Min-No Report  Result Date: 08/22/2022 Fluoroscopy was utilized by the requesting physician.  No radiographic interpretation.   CT PELVIS WO CONTRAST  Result Date: 08/21/2022 CLINICAL DATA:  Bone lesion, pelvis, incidental EXAM: CT PELVIS WITHOUT CONTRAST TECHNIQUE: Multidetector CT imaging of the pelvis was performed following the standard protocol without intravenous contrast. RADIATION DOSE REDUCTION: This exam was performed according to the departmental dose-optimization program which includes automated exposure control, adjustment of the mA and/or kV according to patient size and/or use of iterative reconstruction technique. COMPARISON:  Plain films earlier today FINDINGS: Urinary Tract: Ureters are decompressed. Urinary bladder grossly unremarkable. Bowel:  Visualized large and small bowel unremarkable. Vascular/Lymphatic: Aortoiliac atherosclerosis. No aneurysm or adenopathy. Reproductive:  Uterus and adnexa unremarkable.  No mass. Other:  No free fluid or free air. Musculoskeletal: No acute bony abnormality. Specifically, no fracture in the right inferior pubic ramus as questioned on plain film. Hip joints symmetric. Postoperative changes in the lower lumbar spine from posterior fusion. IMPRESSION: No acute bony abnormality. Specifically, no right inferior pubic ramus fracture as questioned on plain films. Aortic atherosclerosis. Electronically Signed   By: Charlett NoseKevin  Dover M.D.   On: 08/21/2022 20:18   US RENAL  Result Date: 08/21/2022 CLINICAL DATA:  Acute kidney injury EXAM: RENAL / URINARY TRACT ULTRASOUND COMPLETE COMPARISON:  None Available. FINDINGS: Right Kidney: Renal measurements: 9.4 x 4.6 x 4.6 cm = volume: 104 mL. Echogenicity within normal limits. No mass or hydronephrosis visualized. Left Kidney: Renal measurements:  10.0 x 5.0 x 4.7 cm = volume: 121 mL. Echogenicity within normal limits. No mass or hydronephrosis visualized. Bladder: Appears normal for degree of bladder distention. Other: None. IMPRESSION: No ultrasound abnormality  of the kidneys or bladder. No hydronephrosis. Electronically Signed   By: Jearld Lesch M.D.   On: 08/21/2022 15:58       Kathlen Mody M.D. [facility] 08/23/2022, 2:06 PM  Available via Epic secure chat 7am-7pm After 7 pm, please refer to night coverage provider listed on amion.

## 2022-08-23 NOTE — Progress Notes (Signed)
    OVERNIGHT PROGRESS REPORT                                                                 (Remote Coverage Notification)  Notified by RN for Witnessed fall.   Patient was placed in bathroom seated on toilet. RN stepped out of bathroom and instructed patient to wait for her return as she straightened her bed and pillows. RN came back to see patient off of toilet trying to flush and fell to her bottom before she could reach her. Witnessed fall.  Vitals in computer and cbg.    No visible injuries No additional complaints from patient. Patient did not hit her head.  Patient is at baseline A&O x4  Movement of all extremities x4 at baseline.    Patient is in no obvious or stated distress and will be observed per fall protocol.    Chinita Greenland MSNA MSN ACNPC-AG Acute Care Nurse Practitioner [facility]

## 2022-08-24 ENCOUNTER — Encounter (HOSPITAL_COMMUNITY): Payer: Self-pay | Admitting: Physical Medicine and Rehabilitation

## 2022-08-24 ENCOUNTER — Other Ambulatory Visit: Payer: Self-pay

## 2022-08-24 ENCOUNTER — Inpatient Hospital Stay (HOSPITAL_COMMUNITY)
Admission: RE | Admit: 2022-08-24 | Discharge: 2022-09-01 | DRG: 945 | Disposition: A | Discharge status: Home / Self Care | Payer: Medicaid Other | Source: Intra-hospital | Attending: Physical Medicine and Rehabilitation | Admitting: Physical Medicine and Rehabilitation

## 2022-08-24 DIAGNOSIS — Z6834 Body mass index (BMI) 34.0-34.9, adult: Secondary | ICD-10-CM

## 2022-08-24 DIAGNOSIS — M48061 Spinal stenosis, lumbar region without neurogenic claudication: Secondary | ICD-10-CM | POA: Diagnosis present

## 2022-08-24 DIAGNOSIS — E11649 Type 2 diabetes mellitus with hypoglycemia without coma: Secondary | ICD-10-CM | POA: Diagnosis present

## 2022-08-24 DIAGNOSIS — M5126 Other intervertebral disc displacement, lumbar region: Secondary | ICD-10-CM | POA: Diagnosis present

## 2022-08-24 DIAGNOSIS — E785 Hyperlipidemia, unspecified: Secondary | ICD-10-CM

## 2022-08-24 DIAGNOSIS — E669 Obesity, unspecified: Secondary | ICD-10-CM | POA: Diagnosis present

## 2022-08-24 DIAGNOSIS — M5416 Radiculopathy, lumbar region: Secondary | ICD-10-CM | POA: Diagnosis present

## 2022-08-24 DIAGNOSIS — Z7951 Long term (current) use of inhaled steroids: Secondary | ICD-10-CM

## 2022-08-24 DIAGNOSIS — F419 Anxiety disorder, unspecified: Secondary | ICD-10-CM | POA: Diagnosis present

## 2022-08-24 DIAGNOSIS — F1410 Cocaine abuse, uncomplicated: Secondary | ICD-10-CM | POA: Diagnosis present

## 2022-08-24 DIAGNOSIS — W19XXXA Unspecified fall, initial encounter: Secondary | ICD-10-CM | POA: Diagnosis not present

## 2022-08-24 DIAGNOSIS — Z79899 Other long term (current) drug therapy: Secondary | ICD-10-CM | POA: Diagnosis not present

## 2022-08-24 DIAGNOSIS — M170 Bilateral primary osteoarthritis of knee: Secondary | ICD-10-CM | POA: Diagnosis present

## 2022-08-24 DIAGNOSIS — M48062 Spinal stenosis, lumbar region with neurogenic claudication: Secondary | ICD-10-CM | POA: Diagnosis not present

## 2022-08-24 DIAGNOSIS — Z981 Arthrodesis status: Secondary | ICD-10-CM

## 2022-08-24 DIAGNOSIS — K5900 Constipation, unspecified: Secondary | ICD-10-CM | POA: Diagnosis present

## 2022-08-24 DIAGNOSIS — M25561 Pain in right knee: Secondary | ICD-10-CM | POA: Diagnosis present

## 2022-08-24 DIAGNOSIS — Z833 Family history of diabetes mellitus: Secondary | ICD-10-CM

## 2022-08-24 DIAGNOSIS — W19XXXD Unspecified fall, subsequent encounter: Secondary | ICD-10-CM | POA: Diagnosis present

## 2022-08-24 DIAGNOSIS — F32A Depression, unspecified: Secondary | ICD-10-CM | POA: Diagnosis present

## 2022-08-24 DIAGNOSIS — Z9889 Other specified postprocedural states: Secondary | ICD-10-CM | POA: Diagnosis not present

## 2022-08-24 DIAGNOSIS — F1910 Other psychoactive substance abuse, uncomplicated: Secondary | ICD-10-CM | POA: Diagnosis present

## 2022-08-24 DIAGNOSIS — E1122 Type 2 diabetes mellitus with diabetic chronic kidney disease: Secondary | ICD-10-CM | POA: Diagnosis not present

## 2022-08-24 DIAGNOSIS — M25562 Pain in left knee: Secondary | ICD-10-CM | POA: Diagnosis present

## 2022-08-24 DIAGNOSIS — Z794 Long term (current) use of insulin: Secondary | ICD-10-CM | POA: Diagnosis not present

## 2022-08-24 DIAGNOSIS — Z48811 Encounter for surgical aftercare following surgery on the nervous system: Secondary | ICD-10-CM

## 2022-08-24 DIAGNOSIS — G8929 Other chronic pain: Secondary | ICD-10-CM | POA: Diagnosis present

## 2022-08-24 DIAGNOSIS — Z8611 Personal history of tuberculosis: Secondary | ICD-10-CM

## 2022-08-24 DIAGNOSIS — Z7984 Long term (current) use of oral hypoglycemic drugs: Secondary | ICD-10-CM

## 2022-08-24 DIAGNOSIS — N390 Urinary tract infection, site not specified: Secondary | ICD-10-CM | POA: Diagnosis present

## 2022-08-24 DIAGNOSIS — E875 Hyperkalemia: Secondary | ICD-10-CM | POA: Diagnosis present

## 2022-08-24 DIAGNOSIS — D649 Anemia, unspecified: Secondary | ICD-10-CM | POA: Diagnosis present

## 2022-08-24 DIAGNOSIS — E0590 Thyrotoxicosis, unspecified without thyrotoxic crisis or storm: Secondary | ICD-10-CM | POA: Diagnosis present

## 2022-08-24 DIAGNOSIS — Z8249 Family history of ischemic heart disease and other diseases of the circulatory system: Secondary | ICD-10-CM

## 2022-08-24 DIAGNOSIS — E1169 Type 2 diabetes mellitus with other specified complication: Secondary | ICD-10-CM | POA: Diagnosis not present

## 2022-08-24 DIAGNOSIS — M549 Dorsalgia, unspecified: Secondary | ICD-10-CM | POA: Diagnosis present

## 2022-08-24 DIAGNOSIS — R6889 Other general symptoms and signs: Secondary | ICD-10-CM | POA: Diagnosis present

## 2022-08-24 DIAGNOSIS — I10 Essential (primary) hypertension: Secondary | ICD-10-CM | POA: Diagnosis present

## 2022-08-24 DIAGNOSIS — E1142 Type 2 diabetes mellitus with diabetic polyneuropathy: Secondary | ICD-10-CM | POA: Diagnosis present

## 2022-08-24 DIAGNOSIS — R410 Disorientation, unspecified: Secondary | ICD-10-CM | POA: Diagnosis present

## 2022-08-24 DIAGNOSIS — E872 Acidosis, unspecified: Secondary | ICD-10-CM | POA: Diagnosis present

## 2022-08-24 DIAGNOSIS — Z87891 Personal history of nicotine dependence: Secondary | ICD-10-CM

## 2022-08-24 DIAGNOSIS — N179 Acute kidney failure, unspecified: Secondary | ICD-10-CM | POA: Diagnosis present

## 2022-08-24 DIAGNOSIS — E876 Hypokalemia: Secondary | ICD-10-CM

## 2022-08-24 DIAGNOSIS — D509 Iron deficiency anemia, unspecified: Secondary | ICD-10-CM

## 2022-08-24 DIAGNOSIS — R5381 Other malaise: Principal | ICD-10-CM | POA: Diagnosis present

## 2022-08-24 DIAGNOSIS — E1165 Type 2 diabetes mellitus with hyperglycemia: Secondary | ICD-10-CM | POA: Diagnosis not present

## 2022-08-24 DIAGNOSIS — J4489 Other specified chronic obstructive pulmonary disease: Secondary | ICD-10-CM | POA: Diagnosis present

## 2022-08-24 DIAGNOSIS — K219 Gastro-esophageal reflux disease without esophagitis: Secondary | ICD-10-CM

## 2022-08-24 DIAGNOSIS — R269 Unspecified abnormalities of gait and mobility: Secondary | ICD-10-CM | POA: Diagnosis present

## 2022-08-24 LAB — GLUCOSE, CAPILLARY
Glucose-Capillary: 174 mg/dL — ABNORMAL HIGH (ref 70–99)
Glucose-Capillary: 173 mg/dL — ABNORMAL HIGH (ref 70–99)
Glucose-Capillary: 119 mg/dL — ABNORMAL HIGH (ref 70–99)
Glucose-Capillary: 147 mg/dL — ABNORMAL HIGH (ref 70–99)
Glucose-Capillary: 133 mg/dL — ABNORMAL HIGH (ref 70–99)

## 2022-08-24 MED ORDER — SORBITOL 70 % SOLN: 30.0000 mL | Freq: Every day | Status: DC | PRN

## 2022-08-24 MED ORDER — SODIUM CHLORIDE 0.9 % IV SOLN
250.0000 mL | INTRAVENOUS | Status: DC
  Administered 2022-08-24: 250 mL via INTRAVENOUS

## 2022-08-24 MED ORDER — GUAIFENESIN-DM 100-10 MG/5ML PO SYRP: 5.0000 mL | ORAL_SOLUTION | Freq: Four times a day (QID) | ORAL | Status: DC | PRN

## 2022-08-24 MED ORDER — PANTOPRAZOLE SODIUM 40 MG PO TBEC
40.0000 mg | DELAYED_RELEASE_TABLET | Freq: Every day | ORAL | Status: DC
  Administered 2022-08-25 – 2022-09-01 (×8): 40 mg via ORAL
  Filled 2022-08-24 (×8): qty 1

## 2022-08-24 MED ORDER — ACETAMINOPHEN 325 MG PO TABS
325.0000 mg | ORAL_TABLET | ORAL | Status: DC | PRN
  Administered 2022-08-28: 650 mg via ORAL
  Filled 2022-08-24 (×3): qty 2

## 2022-08-24 MED ORDER — INSULIN DETEMIR 100 UNIT/ML ~~LOC~~ SOLN: 15.0000 [IU] | Freq: Two times a day (BID) | SUBCUTANEOUS | 11 refills | Status: DC

## 2022-08-24 MED ORDER — POLYETHYLENE GLYCOL 3350 17 G PO PACK
17.0000 g | PACK | Freq: Every day | ORAL | Status: DC
  Administered 2022-08-24: 17 g via ORAL

## 2022-08-24 MED ORDER — SODIUM CHLORIDE 0.9 % IV SOLN: INTRAVENOUS | Status: DC

## 2022-08-24 MED ORDER — ARIPIPRAZOLE 10 MG PO TABS
10.0000 mg | ORAL_TABLET | Freq: Every day | ORAL | Status: DC
  Administered 2022-08-25 – 2022-09-01 (×8): 10 mg via ORAL
  Filled 2022-08-24 (×8): qty 1

## 2022-08-24 MED ORDER — METHIMAZOLE 5 MG PO TABS
5.0000 mg | ORAL_TABLET | Freq: Every day | ORAL | Status: DC
  Administered 2022-08-25 – 2022-09-01 (×8): 5 mg via ORAL
  Filled 2022-08-24 (×8): qty 1

## 2022-08-24 MED ORDER — SENNOSIDES-DOCUSATE SODIUM 8.6-50 MG PO TABS
1.0000 | ORAL_TABLET | Freq: Two times a day (BID) | ORAL | Status: DC
  Administered 2022-08-24: 1 via ORAL
  Filled 2022-08-24: qty 1

## 2022-08-24 MED ORDER — INSULIN ASPART 100 UNIT/ML IJ SOLN
0.0000 [IU] | INTRAMUSCULAR | Status: DC
  Administered 2022-08-24: 3 [IU] via SUBCUTANEOUS
  Administered 2022-08-25: 4 [IU] via SUBCUTANEOUS

## 2022-08-24 MED ORDER — TRAZODONE HCL 50 MG PO TABS
150.0000 mg | ORAL_TABLET | Freq: Every day | ORAL | Status: DC
  Administered 2022-08-24 – 2022-08-31 (×8): 150 mg via ORAL
  Filled 2022-08-24 (×8): qty 3

## 2022-08-24 MED ORDER — SODIUM CHLORIDE 0.9% FLUSH
3.0000 mL | Freq: Two times a day (BID) | INTRAVENOUS | Status: DC
  Administered 2022-08-24 – 2022-08-27 (×5): 3 mL via INTRAVENOUS

## 2022-08-24 MED ORDER — POLYETHYLENE GLYCOL 3350 17 G PO PACK
17.0000 g | PACK | Freq: Every day | ORAL | Status: DC
  Administered 2022-08-25 – 2022-08-31 (×6): 17 g via ORAL
  Filled 2022-08-24 (×8): qty 1

## 2022-08-24 MED ORDER — PROCHLORPERAZINE 25 MG RE SUPP: 12.5000 mg | Freq: Four times a day (QID) | RECTAL | Status: DC | PRN

## 2022-08-24 MED ORDER — POLYETHYLENE GLYCOL 3350 17 G PO PACK: 17.0000 g | PACK | Freq: Two times a day (BID) | ORAL | 0 refills | Status: DC

## 2022-08-24 MED ORDER — FERROUS SULFATE 325 (65 FE) MG PO TABS
325.0000 mg | ORAL_TABLET | Freq: Every day | ORAL | Status: DC
  Administered 2022-08-25 – 2022-09-01 (×8): 325 mg via ORAL
  Filled 2022-08-24 (×8): qty 1

## 2022-08-24 MED ORDER — BUPROPION HCL ER (XL) 300 MG PO TB24
300.0000 mg | ORAL_TABLET | Freq: Every day | ORAL | Status: DC
  Administered 2022-08-25 – 2022-09-01 (×8): 300 mg via ORAL
  Filled 2022-08-24 (×8): qty 1

## 2022-08-24 MED ORDER — ZOLPIDEM TARTRATE 5 MG PO TABS
5.0000 mg | ORAL_TABLET | Freq: Every day | ORAL | Status: DC
  Administered 2022-08-24 – 2022-08-31 (×8): 5 mg via ORAL
  Filled 2022-08-24 (×8): qty 1

## 2022-08-24 MED ORDER — SODIUM CHLORIDE 0.9% FLUSH: 3.0000 mL | INTRAVENOUS | Status: DC | PRN

## 2022-08-24 MED ORDER — PROCHLORPERAZINE MALEATE 5 MG PO TABS
5.0000 mg | ORAL_TABLET | Freq: Four times a day (QID) | ORAL | Status: DC | PRN
  Administered 2022-08-31: 5 mg via ORAL
  Filled 2022-08-24: qty 1

## 2022-08-24 MED ORDER — DIAZEPAM 5 MG PO TABS: 5.0000 mg | ORAL_TABLET | Freq: Four times a day (QID) | ORAL | Status: DC | PRN

## 2022-08-24 MED ORDER — HYDROCODONE-ACETAMINOPHEN 10-325 MG PO TABS
1.0000 | ORAL_TABLET | ORAL | Status: DC | PRN
  Administered 2022-08-24 – 2022-09-01 (×26): 1 via ORAL
  Filled 2022-08-24 (×26): qty 1

## 2022-08-24 MED ORDER — ALUM & MAG HYDROXIDE-SIMETH 200-200-20 MG/5ML PO SUSP
30.0000 mL | ORAL | Status: DC | PRN
  Administered 2022-08-27 – 2022-09-01 (×5): 30 mL via ORAL
  Filled 2022-08-24 (×5): qty 30

## 2022-08-24 MED ORDER — SENNOSIDES-DOCUSATE SODIUM 8.6-50 MG PO TABS: 1.0000 | ORAL_TABLET | Freq: Every evening | ORAL | Status: DC | PRN

## 2022-08-24 MED ORDER — FLEET ENEMA 7-19 GM/118ML RE ENEM: 1.0000 | ENEMA | Freq: Once | RECTAL | Status: DC | PRN

## 2022-08-24 MED ORDER — ATORVASTATIN CALCIUM 10 MG PO TABS
10.0000 mg | ORAL_TABLET | Freq: Every day | ORAL | Status: DC
  Administered 2022-08-25 – 2022-09-01 (×8): 10 mg via ORAL
  Filled 2022-08-24 (×8): qty 1

## 2022-08-24 MED ORDER — INSULIN DETEMIR 100 UNIT/ML ~~LOC~~ SOLN
15.0000 [IU] | Freq: Two times a day (BID) | SUBCUTANEOUS | Status: DC
  Administered 2022-08-24 – 2022-09-01 (×16): 15 [IU] via SUBCUTANEOUS
  Filled 2022-08-24 (×17): qty 0.15

## 2022-08-24 MED ORDER — SENNOSIDES-DOCUSATE SODIUM 8.6-50 MG PO TABS: 1.0000 | ORAL_TABLET | Freq: Two times a day (BID) | ORAL | 0 refills | Status: DC

## 2022-08-24 MED ORDER — ALBUTEROL SULFATE (2.5 MG/3ML) 0.083% IN NEBU: 2.5000 mg | INHALATION_SOLUTION | Freq: Four times a day (QID) | RESPIRATORY_TRACT | Status: DC | PRN

## 2022-08-24 MED ORDER — FLUTICASONE PROPIONATE 50 MCG/ACT NA SUSP: 2.0000 | Freq: Every day | NASAL | Status: DC | PRN

## 2022-08-24 MED ORDER — BISACODYL 10 MG RE SUPP: 10.0000 mg | Freq: Once | RECTAL | Status: DC

## 2022-08-24 MED ORDER — SENNOSIDES-DOCUSATE SODIUM 8.6-50 MG PO TABS
1.0000 | ORAL_TABLET | Freq: Two times a day (BID) | ORAL | Status: DC
  Administered 2022-08-24 – 2022-09-01 (×15): 1 via ORAL
  Filled 2022-08-24 (×16): qty 1

## 2022-08-24 MED ORDER — HYDROXYZINE HCL 25 MG PO TABS: 25.0000 mg | ORAL_TABLET | Freq: Three times a day (TID) | ORAL | Status: DC | PRN

## 2022-08-24 MED ORDER — DIAZEPAM 2 MG PO TABS: 5.0000 mg | ORAL_TABLET | Freq: Four times a day (QID) | ORAL | Status: DC | PRN

## 2022-08-24 MED ORDER — PROCHLORPERAZINE EDISYLATE 10 MG/2ML IJ SOLN: 5.0000 mg | Freq: Four times a day (QID) | INTRAMUSCULAR | Status: DC | PRN

## 2022-08-24 MED ORDER — METHOCARBAMOL 500 MG PO TABS
500.0000 mg | ORAL_TABLET | Freq: Four times a day (QID) | ORAL | Status: DC | PRN
  Administered 2022-08-24 – 2022-08-27 (×5): 500 mg via ORAL
  Filled 2022-08-24 (×5): qty 1

## 2022-08-24 NOTE — Progress Notes (Signed)
Pt admitted to room 4W17. Patient a/o x4 with no complaints. Patient assessment and hisotry complete. Pt friend present with her. Pt states family is aware she is present on Rehab. Glasses and cellphone noted at bedside, discussed hospital belongings policy. Honeycomb drsg to lower back, steri strips underneath with scant dressing noted to center of honeycomb dressing. Patient with no other skin concerns. Pt daughter came to visit near end of shift.

## 2022-08-24 NOTE — Progress Notes (Signed)
Jennye Boroughs, MD  Physician Physical Medicine and Rehabilitation   PMR Pre-admission    Signed   Date of Service: 08/23/2022  1:05 PM  Related encounter: ED to Hosp-Admission (Current) from 08/19/2022 in [place]      Show:Clear all _0 Written_1 Templated_2 Copied  Added by: _3 Cristina Gong, RN_4 Jennye Boroughs, MD  _5 Hover for details PMR Admission Coordinator Pre-Admission Assessment   Patient: Kara Kelley is an 66 y.o., female MRN: [id number] DOB: 09/03/1956 Height: _6  (149.9 cm) Weight: 77.1 kg   Insurance Information HMO:     PPO:      PCP:      IPA:      80/20:      OTHER:  PRIMARY: Medicaid [place] Access      Policy#: [id number]      Subscriber: pt  SADCY Benefits:  Phone #: passport one source online     Name: 11/14 Eff. Date: active     Deduct:       Out of Pocket Max:       Life Max:  CIR: per Medicaid      SNF: per medicaid Outpatient: per medicaid     Co-Pay:  Home Health: per medicaid      Co-Pay:  DME: per medicaid     Co-Pay:  Providers: in network  SECONDARY: none   Financial Counselor:       Phone#:    [employer]" for patients in Inpatient Rehabilitation Facilities with attached "Privacy Act [place] Records" was provided and verbally reviewed with: N/A   Emergency Contact Information Contact Information       Name Relation Home Work [place] Daughter [phone]   [phone]    Delos Haring Daughter     [phone]    Cromartie,Glinda Sister [phone]   [phone]         Current Medical History  Patient Admitting Diagnosis: Lumbar fusion   History of Present Illness: 66 year old female with history of HTN, HLD, Asthma, type 2 DM, hyperthyroidism, prior ACDF 1/23 and L4-S1 fusion in 2018,GERD and polysubstance abuse. ( 4 years in remission).  Presented on 08/19/22 with complaints of leg weakness  after repeated falls. Symptoms began approximately a month ago when she began with issues with her right hip.    Xray of pelvis and right hip showed question irregularity of inferior pubic ramus fracture. MRI imaging of lumbar spine noted decompression and fusion changes at L4-L5 and L5-S1 with no adverse features, and progressed severe segment disease at L3 with new or increased moderate sized broad-based leftward disc protrusion contributing to moderate to severe spinal stenosis, severe left foraminal and severe left lateral recess stenosis, and bladder distention with over 525 mL of urine present . Neurosurgery consulted. Recommended steroids and pain control.    On 11/13 underwent Lumbar 3-4 lumbar interbody fusion with autograft and pedical screw fixation. Postoperative with pain management needs.    Patient[facility] has been reviewed by the rehabilitation admission coordinator and physician.   Past Medical History      Past Medical History:  Diagnosis Date   Anemia     Anxiety     Arthritis     Asthma     Bunion     Callus     Chronic pain     Cocaine abuse ([place])     COPD (chronic obstructive pulmonary disease) ([place])  Corns and callosities     Degenerative joint disease     Depression     Diabetes mellitus     Endometrial polyp     ETOH abuse     Gall stones     GERD (gastroesophageal reflux disease)     Headache      history of Migraines   Hepatitis C      Hep C   Hyperlipidemia     Hypertension     Insomnia     Spondylolisthesis of lumbar region     Substance abuse ([place])      alcoholism   Tuberculosis 1985   Wears dentures     Wears glasses      Has the patient had major surgery during 100 days prior to admission? Yes   Family History   family history includes Cirrhosis in some other family members; Diabetes in an other family member; Heart disease in her mother.   Current Medications   Current Facility-Administered  Medications:    0.9 %  sodium chloride infusion, 250 mL, Intravenous, Continuous, Pool, Henry, MD   0.9 %  sodium chloride infusion, , Intravenous, Continuous, Regalado, Belkys A, MD, Last Rate: 75 mL/hr at 08/24/22 0706, New Bag at 08/24/22 5573   acetaminophen (TYLENOL) tablet 650 mg, 650 mg, Oral, Q6H PRN **OR** acetaminophen (TYLENOL) suppository 650 mg, 650 mg, Rectal, Q6H PRN, Pool, Mallie Mussel, MD   albuterol (PROVENTIL) (2.5 MG/3ML) 0.083% nebulizer solution 2.5 mg, 2.5 mg, Nebulization, Q6H PRN, Pool, Mallie Mussel, MD   ARIPiprazole (ABILIFY) tablet 10 mg, 10 mg, Oral, Daily, Pool, Mallie Mussel, MD, 10 mg at 08/24/22 0848   atorvastatin (LIPITOR) tablet 10 mg, 10 mg, Oral, Daily, Pool, Mallie Mussel, MD, 10 mg at 08/24/22 0847   bisacodyl (DULCOLAX) suppository 10 mg, 10 mg, Rectal, Once, Regalado, Belkys A, MD   buPROPion (WELLBUTRIN XL) 24 hr tablet 300 mg, 300 mg, Oral, Daily, Pool, Henry, MD, 300 mg at 08/24/22 0848   diazepam (VALIUM) tablet 5 mg, 5 mg, Oral, Q6H PRN, Regalado, Belkys A, MD   ferrous sulfate tablet 325 mg, 325 mg, Oral, Daily, Pool, Mallie Mussel, MD, 325 mg at 08/24/22 0848   fluticasone (FLONASE) 50 MCG/ACT nasal spray 2 spray, 2 spray, Each Nare, PRN, Earnie Larsson, MD   hydrALAZINE (APRESOLINE) injection 10 mg, 10 mg, Intravenous, Q4H PRN, Pool, Mallie Mussel, MD   HYDROcodone-acetaminophen (NORCO) 10-325 MG per tablet 1 tablet, 1 tablet, Oral, Q4H PRN, Pool, Mallie Mussel, MD   HYDROmorphone (DILAUDID) injection 1 mg, 1 mg, Intravenous, Q2H PRN, Earnie Larsson, MD, 1 mg at 08/24/22 0704   hydrOXYzine (ATARAX) tablet 25 mg, 25 mg, Oral, TID PRN, Earnie Larsson, MD, 25 mg at 08/23/22 1950   insulin aspart (novoLOG) injection 0-20 Units, 0-20 Units, Subcutaneous, Q4H, Pool, Mallie Mussel, MD, 4 Units at 08/24/22 0848   insulin detemir (LEVEMIR) injection 15 Units, 15 Units, Subcutaneous, BID, Pool, Mallie Mussel, MD, 15 Units at 08/24/22 0848   menthol-cetylpyridinium (CEPACOL) lozenge 3 mg, 1 lozenge, Oral, PRN **OR** phenol (CHLORASEPTIC)  mouth spray 1 spray, 1 spray, Mouth/Throat, PRN, Pool, Mallie Mussel, MD   methimazole (TAPAZOLE) tablet 5 mg, 5 mg, Oral, Daily, Pool, Mallie Mussel, MD, 5 mg at 08/24/22 0848   morphine (PF) 2 MG/ML injection 2 mg, 2 mg, Intravenous, Q3H PRN, Earnie Larsson, MD, 2 mg at 08/23/22 1126   ondansetron (ZOFRAN) tablet 4 mg, 4 mg, Oral, Q6H PRN **OR** ondansetron (ZOFRAN) injection 4 mg, 4 mg, Intravenous, Q6H PRN, Earnie Larsson, MD   pantoprazole (PROTONIX) EC  tablet 40 mg, 40 mg, Oral, Daily, Pool, Mallie Mussel, MD, 40 mg at 08/24/22 0848   polyethylene glycol (MIRALAX / GLYCOLAX) packet 17 g, 17 g, Oral, Daily, Regalado, Belkys A, MD   senna-docusate (Senokot-S) tablet 1 tablet, 1 tablet, Oral, BID, Regalado, Belkys A, MD   sodium chloride flush (NS) 0.9 % injection 3 mL, 3 mL, Intravenous, Q12H, Pool, Henry, MD, 3 mL at 08/23/22 2310   sodium chloride flush (NS) 0.9 % injection 3 mL, 3 mL, Intravenous, PRN, Earnie Larsson, MD   sodium phosphate (FLEET) 7-19 GM/118ML enema 1 enema, 1 enema, Rectal, Once PRN, Earnie Larsson, MD   traZODone (DESYREL) tablet 150 mg, 150 mg, Oral, QHS, Pool, Mallie Mussel, MD, 150 mg at 08/23/22 2308   zolpidem (AMBIEN) tablet 5 mg, 5 mg, Oral, QHS, Pool, Mallie Mussel, MD, 5 mg at 08/23/22 2309   Patients Current Diet:  Diet Order                  Diet - low sodium heart healthy             Diet regular Room service appropriate? Yes; Fluid consistency: Thin  Diet effective now                       Precautions / Restrictions Precautions Precautions: Fall, Back Precaution Booklet Issued: Yes (comment) Precaution Comments: reviewed back precautions, proper brace wear, and proper posture Spinal Brace: Lumbar corset, Applied in sitting position Restrictions Weight Bearing Restrictions: No    Has the patient had 2 or more falls or a fall with injury in the past year? Yes   Prior Activity Level Community (5-7x/wk): Independent, driving   Prior Functional Level Self Care: Did the patient need help  bathing, dressing, using the toilet or eating? Independent   Indoor Mobility: Did the patient need assistance with walking from room to room (with or without device)? Independent   Stairs: Did the patient need assistance with internal or external stairs (with or without device)? Independent   Functional Cognition: Did the patient need help planning regular tasks such as shopping or remembering to take medications? Independent   Patient Information Are you of Hispanic, Latino/a,or Spanish origin?: A. No, not of Hispanic, Latino/a, or Spanish origin What is your race?: B. Black or African American Do you need or want an interpreter to communicate with a doctor or health care staff?: 0. No   Patient's Response To:  Health Literacy and Transportation Is the patient able to respond to health literacy and transportation needs?: Yes Health Literacy - How often do you need to have someone help you when you read instructions, pamphlets, or other written material from your doctor or pharmacy?: Never In the past 12 months, has lack of transportation kept you from medical appointments or from getting medications?: No In the past 12 months, has lack of transportation kept you from meetings, work, or from getting things needed for daily living?: No   Home Assistive Devices / [place]: Cane - single point, Rollator (4 wheels)   Prior Device Use: Indicate devices/aids used by the patient prior to current illness, exacerbation or injury? None of the above   Current Functional Level Cognition   Overall Cognitive Status: Within Functional Limits for tasks assessed Orientation Level: Oriented X4 General Comments: Mild impulsive, pt had fall in bathroom overnight when RN left room momentarily to get linens, pt seen when family present to stand by for safety due to pt high  fall risk    Extremity Assessment (includes Sensation/Coordination)   Upper Extremity Assessment: Overall WFL for  tasks assessed, Defer to OT evaluation  Lower Extremity Assessment: Defer to PT evaluation RLE Deficits / Details: most significant weakness at ankle DF, hip flexion, and hip abduction (grossly 3+/5), pt denies numbness or tingling in extremity. RLE Sensation: WNL RLE Coordination: decreased fine motor, decreased gross motor     ADLs   Overall ADL's : Needs assistance/impaired Eating/Feeding: Independent, Sitting Grooming: Set up, Bed level, Sitting Upper Body Bathing: Set up, Sitting Lower Body Bathing: Minimal assistance, Moderate assistance, Sit to/from stand Upper Body Dressing : Set up, Sitting Lower Body Dressing: Sit to/from stand, Minimal assistance, Moderate assistance, +2 for safety/equipment Toilet Transfer: Rolling walker (2 wheels), BSC/3in1, Minimal assistance, Ambulation, +2 for physical assistance, +2 for safety/equipment Toileting- Clothing Manipulation and Hygiene: Minimal assistance, Sit to/from stand Tub/Shower Transfer Details (indicate cue type and reason): TBD Functional mobility during ADLs: Minimal assistance, Rolling walker (2 wheels), +2 for physical assistance, +2 for safety/equipment General ADL Comments: Pt seen for OT treatment and re-eval following surgery. Current goals remain appropriate, will focus on PLOC and increased independence with functional mobility and ADL's     Mobility   Overal bed mobility: Needs Assistance Bed Mobility: Rolling, Sidelying to Sit Rolling: Min assist Sidelying to sit: Mod assist, HOB elevated Supine to sit: Supervision, HOB elevated General bed mobility comments: vc's for precautions and sequencing. max multimodal cues for log roll technique and increased time to perform.     Transfers   Overall transfer level: Needs assistance Equipment used: Rolling walker (2 wheels) Transfers: Sit to/from Stand Sit to Stand: Mod assist, +2 safety/equipment, From elevated surface General transfer comment: pt unable to stand from bed in  lowest position but was able to stand from elevated bed with modA. Pt needs mod cues for safe technique when sitting     Ambulation / Gait / Stairs / Wheelchair Mobility   Ambulation/Gait Ambulation/Gait assistance: Min assist, +2 safety/equipment Gait Distance (Feet): 16 Feet Assistive device: Rolling walker (2 wheels) Gait Pattern/deviations: Step-to pattern, Decreased stride length, Decreased step length - right, Decreased dorsiflexion - right, Shuffle, Decreased dorsiflexion - left General Gait Details: mild LE instability initially but pt able to perform standing hip flexion x10 reps without significant buckling so performed gait trial out of room with close chair follow for safety. Pt able to indicate when she needed seated break with mod cues/check-ins from PTA. Pt needing some assist for RW management and step sequencing/directional cues. HR to 116 bpm with exertion. Gait velocity: decreased Gait velocity interpretation: <1.31 ft/sec, indicative of household ambulator     Posture / Balance Balance Overall balance assessment: Needs assistance Sitting-balance support: No upper extremity supported, Feet supported Sitting balance-Leahy Scale: Fair Standing balance support: Reliant on assistive device for balance, Bilateral upper extremity supported Standing balance-Leahy Scale: Poor Standing balance comment: RW and minA     Special needs/care consideration Fall in bathroom on acute 11/15 am Fall precautions    Previous Home Environment  Living Arrangements: Alone  Lives With: Alone Available Help at Discharge:  (3 daughters live local and will provide 24/7) Type of Home: Apartment Home Layout: One level Home Access: Ramped entrance Bathroom Shower/Tub: Tub/shower unit Bathroom Toilet: Handicapped height Bathroom Accessibility: Yes How Accessible: Accessible via walker Home Care Services: No Additional Comments:  (local daughters work in [employer] and can provide 24/7)    Discharge Living Setting Plans for Discharge Living Setting:  Apartment Type of Home at Discharge: Apartment Discharge Home Layout: One level Discharge Home Access: [place] entrance Discharge Bathroom Shower/Tub: Tub/shower unit Discharge Bathroom Toilet: Handicapped height Discharge Bathroom Accessibility: Yes How Accessible: Accessible via walker Does the patient have any problems obtaining your medications?: No   Social/Family/Support Systems Patient Roles: Parent Contact Information: Alnisa, daughter Anticipated Caregiver: daughters Anticipated Caregiver's Contact Information: see contacts Ability/Limitations of Caregiver: none Caregiver Availability: 24/7 Discharge Plan Discussed with Primary Caregiver: Yes Is Caregiver In Agreement with Plan?: Yes Does Caregiver/Family have Issues with Lodging/Transportation while Pt is in Rehab?: No   Goals Patient/Family Goal for Rehab: supervision with PT and OT Expected length of stay: ELOS 7 to 10 days Pt/Family Agrees to Admission and willing to participate: Yes Program Orientation Provided & Reviewed with Pt/Caregiver Including Roles  & Responsibilities: Yes   Decrease burden of Care through IP rehab admission: n/a   Possible need for SNF placement upon discharge: not anticipated   Patient Condition: I have reviewed medical records from [facility], spoken with  patient and daughter. I met with patient at the bedside for inpatient rehabilitation assessment.  Patient will benefit from ongoing PT and OT, can actively participate in 3 hours of therapy a day 5 days of the week, and can make measurable gains during the admission.  Patient will also benefit from the coordinated team approach during an Inpatient Acute Rehabilitation admission.  The patient will receive intensive therapy as well as Rehabilitation physician, nursing, social worker, and care management interventions.  Due to bladder management, bowel management, safety,  skin/wound care, disease management, medication administration, pain management, and patient education the patient requires 24 hour a day rehabilitation nursing.  The patient is currently mod assist overall with mobility and basic ADLs.  Discharge setting and therapy post discharge at home with home health is anticipated.  Patient has agreed to participate in the Acute Inpatient Rehabilitation Program and will admit today.   Preadmission Screen Completed By:  Cleatrice Burke, 08/24/2022 12:53 PM ______________________________________________________________________   Discussed status with Dr. Curlene Dolphin on 08/24/22 at 1254 and received approval for admission today.   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 Yes Co-Morbidities requiring supervision/potential complications: R hip pain, AKI, hyperkalemia, DM2, HTN, GERD, anemia Due to bladder management, bowel management, safety, skin/wound care, disease management, medication administration, pain management, and patient education, does the patient require 24 hr/day rehab nursing? Yes Does the patient require coordinated care of a physician, rehab nurse, PT, OT, and SLP to address physical and functional deficits in the context of the above medical diagnosis(es)? Yes Addressing deficits in the following areas: balance, endurance, locomotion, strength, transferring, bowel/bladder control, bathing, dressing, feeding, grooming, toileting, and psychosocial support Can the patient actively participate in an intensive therapy program of at least 3 hrs of therapy 5 days a week? Yes The potential for patient to make  measurable gains while on inpatient rehab is excellent Anticipated functional outcomes upon discharge from inpatient rehab: supervision PT, supervision OT, n/a SLP Estimated rehab length of stay to reach the above functional goals is: 7-10 Anticipated discharge destination: Home 10. Overall Rehab/Functional Prognosis: excellent     MD Signature: Jennye Boroughs  Revision History

## 2022-08-24 NOTE — Progress Notes (Signed)
Inpatient Rehabilitation Admissions Coordinator   CIR bed is available to admit today. I contacted her daughter, Epimenio Sarin by phone and she and patient are in agreement,. Dr Sunnie Nielsen in agreement to admit but to keep IVF overnight and recheck labs in the am . Dr Carlis Abbott made aware and in agreement. I will alert acute team and TOC to make the arrangements to admit to CIR today.  Ottie Glazier, RN, MSN Rehab Admissions Coordinator [phone] 08/24/2022 12:44 PM

## 2022-08-24 NOTE — Progress Notes (Signed)
Mobility Specialist Progress Note   08/24/22 1210  Mobility  Activity Transferred from chair to bed  Level of Assistance Minimal assist, patient does 75% or more  Assistive Device Front wheel walker  Distance Ambulated (ft) 4 ft  Activity Response Tolerated well  $Mobility charge 1 Mobility   RN requesting assistance to get pt from chair to bed d/t increasing discomfort. Required MinA to stand but mod verbals cues for sequencing secondary to impulsiveness. No faults during transfer, pt left in bed with all needs met, and call bell in reach.   Holland Falling Mobility Specialist Acute Rehab Office:  [phone]
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
## 2022-08-24 NOTE — Progress Notes (Signed)
Met with patient. Oriented to rehab. Somewhat confused. Wanted to get dressed. Discussed binder and A1c of 7.4. she was shocked that was that high. Notified staff that patient had arrived to unit. All needs met. Call bell in reach.

## 2022-08-24 NOTE — Discharge Summary (Signed)
Physician Discharge Summary   Patient: Kara Kelley MRN: [id number] DOB: [birth year]  Admit date:     08/19/2022  Discharge date: 08/24/22  Discharge Physician: [name]   PCP: Camillia Herter, NP   Recommendations at discharge:    Needs Bmet to follow bicarb level.  Adjust insulin regimen as needed.   Discharge Diagnoses: Principal Problem:   Falls, initial encounter Active Problems:   Leg weakness   Lumbar stenosis   AKI (acute kidney injury) ([place])   Hyponatremia   DM type 2 (diabetes mellitus, type 2) (HCC)   Essential hypertension   Microcytic hypochromic anemia   Hyperthyroidism   Gastroesophageal reflux disease without esophagitis   Polysubstance abuse ([place])   Frequent falls  Resolved Problems:   * No resolved hospital problems. *  Hospital Course: 66 year old with past medical history significant for hypertension, hyperlipidemia, moderate persistent asthma, diabetes type 2, hypothyroidism, GERD, polysubstance abuse who presents complaining of leg weakness after having repeated falls. She has had prior C4-C7 ACDF in January 2023 in January 2023 by Dr. Trenton Gammon and prior L 4-S 1 decompression and fusion possibly back in 2018.  MRI of the lumbar spine noted decompression and fusion changes at L4-L5 and L5-S1 with no adverse features and progressed to severe segment disease at L3 with new and increased moderate sized broad-based leftward disc protrusion contributing to moderate to severe spinal stenosis, severe left foraminal and severe left lateral recess stenosis and bladder distention with over 525 ml of urine.  Surgery was consulted, patient received a steroids.  Pain controlled.  Subsequently patient underwent bilateral L3-4 decompressive laminotomies and foraminotomies.  L3-4 posterior lumbar interbody fusion utilizing interbody cages, local harvested autograft, and morselized allograft L3-4 posterior lateral arthrodesis utilizing nonsegmental pedicle screw  fixation and local autografting.  Patient will be transferred to CIR today.   Assessment and Plan: 1-Bilateral lower extremity weakness: MRI Lumbar Spine: decompression and fusion changes at L4-5 and L5-S1; progressed severe segment disease at L3 -MRI of thoracic spine was unremarkable -Patient underwent bilateral L3-4 decompressive laminotomies and foraminotomies. L3-4 posterior lumbar interbody fusion utilizing interbody cages, local harvested autograft, and morselized allograft L3-4 posterior lateral arthrodesis utilizing nonsegmental pedicle screw fixation and local autografting. -Continue with PT, pain management, plan to transfer to CIR  2-Right hip pain: X-ray of the pelvis and right hip obtained showed questionable irregularity of the inferior pubic ramus fracture.  CT: No acute bony abnormality.  Specifically no right inferior pubic ramus fracture.  3-AKI: Prerenal.  Continue with IV fluids  Hyperkalemia: Received 2 doses of Lokelma.  Continue to hold spironolactone and lisinopril. Resolved  Diabetes type 2, hyperglycemia uncontrolled In the setting of steroids Continue with Levemir twice daily.  Continue with a sliding scale insulin at CIR  Metabolic acidosis; gap normal. Cbg glucose decreased.  Suspect hypovolemia. Started IV fluids. Plan to continue with fluids at rehab. Needs Bmet in am. Afebrile.   Hyperthyroidism: Continue with methimazole  Anemia of chronic disease: Monitor  GERD: Continue with PPI      Consultants: Neurosurgery  Procedures performed: Sx Disposition: Home Diet recommendation:  Discharge Diet Orders (From admission, onward)     Start     Ordered   08/24/22 0000  Diet - low sodium heart healthy        08/24/22 1244           Carb modified diet DISCHARGE MEDICATION: Allergies as of 08/24/2022   No Known Allergies      Medication  List     STOP taking these medications    acetaminophen 650 MG CR tablet Commonly known as:  TYLENOL   cyclobenzaprine 5 MG tablet Commonly known as: FLEXERIL   Levemir FlexTouch 100 UNIT/ML FlexPen Generic drug: insulin detemir Replaced by: insulin detemir 100 UNIT/ML injection   lisinopril 10 MG tablet Commonly known as: ZESTRIL   MELOXICAM PO   NovoLOG FlexPen 100 UNIT/ML FlexPen Generic drug: insulin aspart   oxyCODONE-acetaminophen 5-325 MG tablet Commonly known as: Percocet   spironolactone 50 MG tablet Commonly known as: ALDACTONE       TAKE these medications    Accu-Chek FastClix Lancets Misc Use as directed to check blood sugars 2 times per day dx: e11.65   Accu-Chek Softclix Lancets lancets Use as instructed to check blood sugars twice daily E11.69   Accu-Chek Guide Me w/Device Kit Use to check blood sugars twice daily E11.69   Accu-Chek Guide test strip Generic drug: glucose blood Use as directed to check blood sugars 2 times per day dx: e11.69   albuterol 108 (90 Base) MCG/ACT inhaler Commonly known as: VENTOLIN HFA Inhale 2 puffs into the lungs every 6 (six) hours as needed for wheezing or shortness of breath. What changed: when to take this   ARIPiprazole 10 MG tablet Commonly known as: ABILIFY Take 10 mg by mouth daily.   atorvastatin 10 MG tablet Commonly known as: LIPITOR Take 1 tablet (10 mg total) by mouth daily.   Belsomra 5 MG Tabs Generic drug: Suvorexant Take 5 mg by mouth at bedtime. What changed: Another medication with the same name was removed. Continue taking this medication, and follow the directions you see here.   buPROPion 300 MG 24 hr tablet Commonly known as: WELLBUTRIN XL Take 1 tablet (300 mg total) by mouth daily. What changed: when to take this   fluticasone 50 MCG/ACT nasal spray Commonly known as: FLONASE Place 1 spray into both nostrils daily. What changed:  how much to take when to take this reasons to take this   fluticasone-salmeterol 250-50 MCG/ACT Aepb Commonly known as: Advair  Diskus Inhale 1 puff into the lungs in the morning and at bedtime.   Gvoke HypoPen 2-Pack 1 MG/0.2ML Soaj Generic drug: Glucagon Inject 1 each into the skin as needed. If blood sugar less than 60   hydrOXYzine 25 MG tablet Commonly known as: ATARAX Take 1 tablet (25 mg total) by mouth every 8 (eight) hours as needed for anxiety. What changed: when to take this   insulin detemir 100 UNIT/ML injection Commonly known as: LEVEMIR Inject 0.15 mLs (15 Units total) into the skin 2 (two) times daily. Replaces: Levemir FlexTouch 100 UNIT/ML FlexPen   Iron (Ferrous Sulfate) 325 (65 Fe) MG Tabs Take 325 mg by mouth daily.   Linzess 145 MCG Caps capsule Generic drug: linaclotide Take 145 mcg by mouth daily before breakfast.   methimazole 5 MG tablet Commonly known as: TAPAZOLE Take 1 tablet (5 mg total) by mouth daily.   multivitamin with minerals tablet Take 1 tablet by mouth daily. Alive   omeprazole 40 MG capsule Commonly known as: PRILOSEC TAKE 1 CAPSULE(40 MG) BY MOUTH DAILY BEFORE BREAKFAST What changed:  how much to take how to take this when to take this additional instructions   Ozempic (1 MG/DOSE) 4 MG/3ML Sopn Generic drug: Semaglutide (1 MG/DOSE) Inject 1 mg into the skin once a week.   polyethylene glycol 17 g packet Commonly known as: MIRALAX / GLYCOLAX Take 17 g by  mouth 2 (two) times daily.   promethazine 25 MG tablet Commonly known as: PHENERGAN Take 1 tablet (25 mg total) by mouth every 8 (eight) hours as needed for nausea or vomiting. What changed: when to take this   senna-docusate 8.6-50 MG tablet Commonly known as: Senokot-S Take 1 tablet by mouth 2 (two) times daily.   traZODone 150 MG tablet Commonly known as: DESYREL Take 150 mg by mouth at bedtime.   VITAMIN D PO Take 1 capsule by mouth daily.               Durable Medical Equipment  (From admission, onward)           Start     Ordered   08/22/22 2001  DME Walker rolling   Once       Question:  Patient needs a walker to treat with the following condition  Answer:  Lumbar stenosis with neurogenic claudication   08/22/22 2001   08/22/22 2001  DME 3 n 1  Once        08/22/22 2001            Discharge Exam: Filed Weights   08/20/22 1432  Weight: 77.1 kg   General; NAD  Condition at discharge: stable  The results of significant diagnostics from this hospitalization (including imaging, microbiology, ancillary and laboratory) are listed below for reference.   Imaging Studies: DG Lumbar Spine 2-3 Views  Result Date: 08/22/2022 CLINICAL DATA:  [id number] Surgery, elective [id number] EXAM: LUMBAR SPINE - 2-3 VIEW COMPARISON:  None Available. FINDINGS: Multiple intraoperative fluoroscopic images during L3-L4 posterior and interbody fusion. Posterior fusion rod is not yet in place. Unchanged L5-S1 anterior posterior fusion and interbody fusion at L4-L5. IMPRESSION: Intraoperative images during L3-L4 anterior posterior fusion. Electronically Signed   By: Maurine Simmering M.D.   On: 08/22/2022 19:23   DG C-Arm 1-60 Min-No Report  Result Date: 08/22/2022 Fluoroscopy was utilized by the requesting physician.  No radiographic interpretation.   CT PELVIS WO CONTRAST  Result Date: 08/21/2022 CLINICAL DATA:  Bone lesion, pelvis, incidental EXAM: CT PELVIS WITHOUT CONTRAST TECHNIQUE: Multidetector CT imaging of the pelvis was performed following the standard protocol without intravenous contrast. RADIATION DOSE REDUCTION: This exam was performed according to the departmental dose-optimization program which includes automated exposure control, adjustment of the mA and/or kV according to patient size and/or use of iterative reconstruction technique. COMPARISON:  Plain films earlier today FINDINGS: Urinary Tract: Ureters are decompressed. Urinary bladder grossly unremarkable. Bowel:  Visualized large and small bowel unremarkable. Vascular/Lymphatic: Aortoiliac atherosclerosis. No  aneurysm or adenopathy. Reproductive:  Uterus and adnexa unremarkable.  No mass. Other:  No free fluid or free air. Musculoskeletal: No acute bony abnormality. Specifically, no fracture in the right inferior pubic ramus as questioned on plain film. Hip joints symmetric. Postoperative changes in the lower lumbar spine from posterior fusion. IMPRESSION: No acute bony abnormality. Specifically, no right inferior pubic ramus fracture as questioned on plain films. Aortic atherosclerosis. Electronically Signed   By: Rolm Baptise M.D.   On: 08/21/2022 20:18   US RENAL  Result Date: 08/21/2022 CLINICAL DATA:  Acute kidney injury EXAM: RENAL / URINARY TRACT ULTRASOUND COMPLETE COMPARISON:  None Available. FINDINGS: Right Kidney: Renal measurements: 9.4 x 4.6 x 4.6 cm = volume: 104 mL. Echogenicity within normal limits. No mass or hydronephrosis visualized. Left Kidney: Renal measurements: 10.0 x 5.0 x 4.7 cm = volume: 121 mL. Echogenicity within normal limits. No mass or hydronephrosis visualized. Bladder: Appears normal  for degree of bladder distention. Other: None. IMPRESSION: No ultrasound abnormality of the kidneys or bladder. No hydronephrosis. Electronically Signed   By: Delanna Ahmadi M.D.   On: 08/21/2022 15:58   DG HIP UNILAT WITH PELVIS 2-3 VIEWS RIGHT  Result Date: 08/21/2022 CLINICAL DATA:  Right hip pain EXAM: DG HIP (WITH OR WITHOUT PELVIS) 2-3V RIGHT COMPARISON:  None Available. FINDINGS: Irregularity in the right inferior pubic ramus may reflect nondisplaced fracture. No proximal femoral fracture. No subluxation or dislocation. Hip joints and SI joints symmetric. Postoperative changes in the lower lumbar spine. IMPRESSION: Questionable nondisplaced right inferior pubic ramus fracture. No proximal femoral fracture. Electronically Signed   By: Rolm Baptise M.D.   On: 08/21/2022 12:24   MR THORACIC SPINE WO CONTRAST  Result Date: 08/20/2022 CLINICAL DATA:  Myelopathy, acute, thoracic spine EXAM:  MRI THORACIC SPINE WITHOUT CONTRAST TECHNIQUE: Multiplanar, multisequence MR imaging of the thoracic spine was performed. No intravenous contrast was administered. COMPARISON:  None Available. FINDINGS: Alignment:  Normal. Vertebrae: Vertebral body heights are maintained. No marrow edema to suggest acute fracture or discitis/osteomyelitis. No suspicious bone lesions. Cord:  Normal cord signal. Paraspinal and other soft tissues: No acute findings. Disc levels: Multilevel degenerative disc disease with multilevel disc height loss, greatest in the mid to lower thoracic spine. Small disc bulges at T6-T7, T7-T8, T8-T9, T9-T10 and T10-T11 which mildly efface ventral CSF with mild canal stenosis at these levels. Multilevel disc bulging/endplate spurring and facet arthropathy results in foraminal stenosis that is greatest (moderate) on the left at T9-T10 and T10-T11 and mild on the right at these levels. IMPRESSION: 1. Moderate left and mild right foraminal stenosis at T9-T10 and T10-T11. 2. Mild multifocal canal stenosis due to small disc bulges, described above. 3. Normal cord signal. Electronically Signed   By: Margaretha Sheffield M.D.   On: 08/20/2022 17:21   MR LUMBAR SPINE WO CONTRAST  Result Date: 08/20/2022 CLINICAL DATA:  66 year old female with multiple falls. Neurologic deficit. Severe back pain. EXAM: MRI LUMBAR SPINE WITHOUT CONTRAST TECHNIQUE: Multiplanar, multisequence MR imaging of the lumbar spine was performed. No intravenous contrast was administered. COMPARISON:  [facility] lumbar MRI 02/25/2021. CT Abdomen and Pelvis 10/20/2020. FINDINGS: The examination had to be discontinued prior to completion due to patient pain, but only axial T1 weighted imaging was not obtained. Segmentation: normal on the prior CT which is the same numbering system used on the MRI last year. Alignment: Stable lumbar lordosis since last year. No significant scoliosis or spondylolisthesis. Vertebrae: Mild hardware  susceptibility artifact L4-L5 and L5-S1. This hardware configuration appears stable from the CT last year. Faint degenerative endplate marrow edema at L3-L4 posteriorly. Background bone marrow signal within normal limits. Chronic T12 superior endplate compression. No acute osseous abnormality identified. Conus medullaris and cauda equina: Conus extends to the T12-L1 level. No lower spinal cord or conus signal abnormality. Paraspinal and other soft tissues: Marked bladder distension (series 5, image 7) with bladder volume of at least 525 mL. Chronic postoperative changes to the lower lumbar paraspinal soft tissues with regressed postoperative subcutaneous seroma since last year. Small volume residual. Negative visible abdominal viscera. Disc levels: Visible lower thoracic levels through L1-L2 remain normal for age. L2-L3: Mild disc bulging. Moderate facet and ligament flavum hypertrophy and epidural lipomatosis appear progressed from last year. Mild new spinal stenosis now (series 6, image 17). L3-L4: Increased disc bulging and new or increased moderate size left lateral recess and foraminal broad-based disc extrusion (series 3, images 9-12 and  series 6, image 22). Moderate to severe facet and ligament flavum hypertrophy is increased. Trace new right facet joint fluid. Ongoing epidural lipomatosis. Progressed and severe left foraminal and left lateral recess stenosis (left L3 and L4 nerve levels, respectively). Progressed moderate to severe spinal stenosis. Increased mild to moderate right L3 foraminal stenosis. L4-L5: Decompression and fusion changes with solid posterior element arthrodesis here by CT last year. No convincing stenosis. L5-S1: Decompression and fusion changes with questionable arthrodesis here by CT last year. Residual endplate spurring, but no definite stenosis. IMPRESSION: 1. The examination had to be discontinued prior to completion due to patient pain, but only axial T1 weighted imaging was not  obtained. 2. Decompression and fusion changes at L4-L5 and L5-S1 with no adverse features, solid arthrodesis at the former by CT last year. 3. Progressed and now severe adjacent segment disease at L3-L4: New or increased moderate sized and broad-based leftward disc extrusion contributing to moderate to severe spinal stenosis, severe left foraminal and severe left lateral recess stenosis. Query Left L3 and/or L4 radiculitis. 4. And mild multifactorial spinal stenosis at L2-L3 has progressed since last year. 5. Marked bladder distension with bladder volume of at least 525 mL. Query urinary retention. Electronically Signed   By: Genevie Ann M.D.   On: 08/20/2022 08:42   MR BRAIN WO CONTRAST  Result Date: 08/20/2022 CLINICAL DATA:  66 year old female with multiple falls. Neurologic deficit. EXAM: MRI HEAD WITHOUT CONTRAST TECHNIQUE: Multiplanar, multiecho pulse sequences of the brain and surrounding structures were obtained without intravenous contrast. COMPARISON:  Head CT without contrast 0040 hours today. [facility] cervical spine MRI 09/14/2021. FINDINGS: Brain: Cavum septum pellucidum, normal variant. Cerebral volume is within normal limits for age. No restricted diffusion to suggest acute infarction. No midline shift, mass effect, evidence of mass lesion, ventriculomegaly, extra-axial collection or acute intracranial hemorrhage. Cervicomedullary junction and pituitary are within normal limits. Patchy and confluent bilateral cerebral white matter T2 and FLAIR hyperintensity in a fairly symmetric and nonspecific pattern. No cortical encephalomalacia or chronic cerebral blood products identified. Deep gray nuclei, brainstem and cerebellum appear negative. Vascular: Major intracranial vascular flow voids are preserved. Skull and upper cervical spine: Cervical ACDF since 2022 with mild hardware artifact now. Visualized bone marrow signal is within normal limits. Sinuses/Orbits: Negative. Other: Grossly  normal visible internal auditory structures. Mastoids are clear. Negative visible scalp and face. IMPRESSION: 1. No acute intracranial abnormality. 2. Moderate for age nonspecific cerebral white matter signal changes, most commonly due to chronic small vessel disease. Electronically Signed   By: Genevie Ann M.D.   On: 08/20/2022 08:33   CT HEAD WO CONTRAST  Result Date: 08/20/2022 CLINICAL DATA:  Multiple falls. EXAM: CT HEAD WITHOUT CONTRAST TECHNIQUE: Contiguous axial images were obtained from the base of the skull through the vertex without intravenous contrast. RADIATION DOSE REDUCTION: This exam was performed according to the departmental dose-optimization program which includes automated exposure control, adjustment of the mA and/or kV according to patient size and/or use of iterative reconstruction technique. COMPARISON:  July 13, 2022 FINDINGS: Brain: There is mild cerebral atrophy with widening of the extra-axial spaces and ventricular dilatation. There are areas of decreased attenuation within the white matter tracts of the supratentorial brain, consistent with microvascular disease changes. Vascular: No hyperdense vessel or unexpected calcification. Skull: Normal. Negative for fracture or focal lesion. Sinuses/Orbits: No acute finding. Other: None. IMPRESSION: 1. No acute intracranial abnormality. 2. Generalized cerebral atrophy and microvascular disease changes of the supratentorial brain. Electronically Signed  By: Virgina Norfolk M.D.   On: 08/20/2022 01:07   DG Lumbar Spine Complete  Result Date: 08/20/2022 CLINICAL DATA:  Fall, sciatica EXAM: LUMBAR SPINE - COMPLETE 6 VIEW COMPARISON:  02/11/2021 lumbar spine radiographs FINDINGS: Status post L4-L5 fusion with interbody disc spacers at L3-L4 and L4-L5. Remaining intervertebral disc heights are largely maintained. There is no evidence of lumbar spine fracture. Alignment is normal. IMPRESSION: No acute osseous abnormality. Electronically  Signed   By: Merilyn Baba M.D.   On: 08/20/2022 00:56    Microbiology: Results for orders placed or performed during the hospital encounter of 08/19/22  Surgical pcr screen     Status: None   Collection Time: 08/22/22  9:47 AM   Specimen: Nasal Mucosa; Nasal Swab  Result Value Ref Range Status   MRSA, PCR NEGATIVE NEGATIVE Final   Staphylococcus aureus NEGATIVE NEGATIVE Final    Comment: (NOTE) The Xpert SA Assay (FDA approved for NASAL specimens in patients 24 years of age and older), is one component of a comprehensive surveillance program. It is not intended to diagnose infection nor to guide or monitor treatment. Performed at [facility], [place] [address], [place], [place] [address]     Labs: CBC: Recent Labs  Lab 08/20/22 0011 08/21/22 0212 08/22/22 0311  WBC 7.4 7.5 7.7  NEUTROABS 4.0  --   --   HGB 10.5* 11.0* 10.2*  HCT 31.7* 33.9* 29.9*  MCV 71.9* 72.1* 71.2*  PLT 194 185 604   Basic Metabolic Panel: Recent Labs  Lab 08/20/22 0011 08/21/22 0212 08/22/22 0311 08/23/22 0058 08/23/22 0326  NA 133* 135 134* 133* 135  K 4.6 6.0* 5.1 4.7 4.6  CL 104 107 110 103 108  CO2 20* 15* 17* 18* 16*  GLUCOSE 179* 280* 255* 471* 216*  BUN 28* 28* 33* 34* 35*  CREATININE 1.35* 1.18* 1.16* 1.39* 1.32*  CALCIUM 9.1 9.9 9.3 8.6* 8.5*   Liver Function Tests: Recent Labs  Lab 08/20/22 0011  AST 22  ALT 18  ALKPHOS 143*  BILITOT 0.3  PROT 7.0  ALBUMIN 4.0   CBG: Recent Labs  Lab 08/23/22 2018 08/23/22 2333 08/24/22 0407 08/24/22 0739 08/24/22 1207  GLUCAP 172* 168* 174* 173* 119*    Discharge time spent: greater than 30 minutes.  Signed: [name], MD [facility] 08/24/2022

## 2022-08-24 NOTE — Progress Notes (Signed)
Postop day 2.  Pain significantly improved.  Patient able to stand and ambulate a little bit.  Lower extremities feel stronger and have much less pain.  She is afebrile her vital signs are stable.  Urine output is good.  She is awake and alert.  She is oriented and appropriate.  Motor and sensory function intact to direct testing.  Dressing clean and dry.  Abdomen soft.  Progressing well following L3-4 decompression and fusion surgery.  Continue efforts at mobilization.  Current plan for patient to be admitted to inpatient rehab for couple weeks for strengthening and reconditioning prior to discharge home.  Patient ready for rehab when bed available.

## 2022-08-24 NOTE — Progress Notes (Signed)
Physical Therapy Treatment Patient Details Name: Kara Kelley MRN: [id number] DOB: June 07, 1956 Today's Date: 08/24/2022   History of Present Illness The pt is a 66 yo female presenting 11/10 after x3 falls at home due to weakness in BLE. Pt underwent L3-4 PLIF on 08/22/22.  PMH includes: DM II, hypothyroidism, cervical myelopathy, C4-C7 ACDF in 10/2021, L4-S1 decompression and fusion, anxiety, OA, COPD, depression, HTN, TB.    PT Comments    Pt received in supine, agreeable to therapy session with encouragement due to drowsiness, pt following simple commands well with increased time to process. Pt benefits from multimodal cues for sequencing bed mobility and for back precaution compliance, pt with decreased carryover of safety cues with transfers. Pt needing up to modA for bed mobility and sit<>stand and needed +2 for safety with line mgmt/chair follow given hx of falls. Pt making progress toward goals, able to perform short gait trial today without significant LE buckling. Pt continues to benefit from PT services to progress toward functional mobility goals.    Recommendations for follow up therapy are one component of a multi-disciplinary discharge planning process, led by the attending physician.  Recommendations may be updated based on patient status, additional functional criteria and insurance authorization.  Follow Up Recommendations  Acute inpatient rehab (3hours/day)     Assistance Recommended at Discharge Frequent or constant Supervision/Assistance  Patient can return home with the following A lot of help with bathing/dressing/bathroom;Assistance with cooking/housework;Direct supervision/assist for medications management;Direct supervision/assist for financial management;Assist for transportation;Help with stairs or ramp for entrance;Two people to help with walking and/or transfers   Equipment Recommendations  Wheelchair (measurements PT);Wheelchair cushion (measurements PT);Rolling  walker (2 wheels)    Recommendations for Other Services Rehab consult     Precautions / Restrictions Precautions Precautions: Fall;Back Precaution Booklet Issued: Yes (comment) Precaution Comments: reviewed back precautions, proper brace wear, and proper posture Required Braces or Orthoses: Spinal Brace Spinal Brace: Lumbar corset;Applied in sitting position Restrictions Weight Bearing Restrictions: No     Mobility  Bed Mobility Overal bed mobility: Needs Assistance Bed Mobility: Rolling, Sidelying to Sit Rolling: Min assist Sidelying to sit: Mod assist, HOB elevated       General bed mobility comments: vc's for precautions and sequencing. max multimodal cues for log roll technique and increased time to perform.    Transfers Overall transfer level: Needs assistance Equipment used: Rolling walker (2 wheels) Transfers: Sit to/from Stand Sit to Stand: Mod assist, +2 safety/equipment, From elevated surface           General transfer comment: pt unable to stand from bed in lowest position but was able to stand from elevated bed with modA. Pt needs mod cues for safe technique when sitting    Ambulation/Gait Ambulation/Gait assistance: Min assist, +2 safety/equipment Gait Distance (Feet): 16 Feet Assistive device: Rolling walker (2 wheels) Gait Pattern/deviations: Step-to pattern, Decreased stride length, Decreased step length - right, Decreased dorsiflexion - right, Shuffle, Decreased dorsiflexion - left Gait velocity: decreased     General Gait Details: mild LE instability initially but pt able to perform standing hip flexion x10 reps without significant buckling so performed gait trial out of room with close chair follow for safety. Pt able to indicate when she needed seated break with mod cues/check-ins from PTA. Pt needing some assist for RW management and step sequencing/directional cues. HR to 116 bpm with exertion.   Stairs             Development worker, community  Modified Rankin (Stroke Patients Only)       Balance Overall balance assessment: Needs assistance Sitting-balance support: No upper extremity supported, Feet supported Sitting balance-Leahy Scale: Fair     Standing balance support: Reliant on assistive device for balance, Bilateral upper extremity supported Standing balance-Leahy Scale: Poor Standing balance comment: RW and minA                            Cognition Arousal/Alertness: Awake/alert Behavior During Therapy: WFL for tasks assessed/performed Overall Cognitive Status: Within Functional Limits for tasks assessed                                 General Comments: Mild impulsive, pt had fall in bathroom overnight when RN left room momentarily to get linens, pt seen when family present to stand by for safety due to pt high fall risk        Exercises General Exercises - Lower Extremity Ankle Circles/Pumps: AROM, Both, 10 reps, Seated, Supine Heel Slides: AAROM, Both, 5 reps, Supine    General Comments General comments (skin integrity, edema, etc.): HR to 116 bpm ambulation, 90's bpm resting; BP WFL post-exertion      Pertinent Vitals/Pain Pain Assessment Pain Assessment: Faces Faces Pain Scale: Hurts little more Pain Location: back Pain Descriptors / Indicators: Grimacing, Guarding, Discomfort, Operative site guarding Pain Intervention(s): Monitored during session, Limited activity within patient's tolerance, Repositioned, Patient requesting pain meds-RN notified    Home Living                          Prior Function            PT Goals (current goals can now be found in the care plan section) Acute Rehab PT Goals Patient Stated Goal: return to walking independently PT Goal Formulation: With patient Time For Goal Achievement: 09/04/22 Progress towards PT goals: Progressing toward goals    Frequency    Min 5X/week      PT Plan Current plan remains  appropriate    Co-evaluation              AM-PAC PT "6 Clicks" Mobility   Outcome Measure  Help needed turning from your back to your side while in a flat bed without using bedrails?: A Little Help needed moving from lying on your back to sitting on the side of a flat bed without using bedrails?: A Lot Help needed moving to and from a bed to a chair (including a wheelchair)?: A Lot Help needed standing up from a chair using your arms (e.g., wheelchair or bedside chair)?: A Lot Help needed to walk in hospital room?: Total (<20 ft) Help needed climbing 3-5 steps with a railing? : Total 6 Click Score: 11    End of Session Equipment Utilized During Treatment: Gait belt;Back brace Activity Tolerance: Patient tolerated treatment well Patient left: in chair;with call bell/phone within reach;with chair alarm set (family preparing to leave, NT/RN notified of pt up in chair) Nurse Communication: Mobility status;Precautions;Other (comment) (not to sit >1 hour for now for pt safety) PT Visit Diagnosis: Other abnormalities of gait and mobility (R26.89);Muscle weakness (generalized) (M62.81);Repeated falls (R29.6);Pain Pain - Right/Left: Right Pain - part of body: Leg     Time: 1032-1110 PT Time Calculation (min) (ACUTE ONLY): 38 min  Charges:  $Gait Training: 8-22 mins $Therapeutic Activity: 23-37  mins                     Houston Siren., PTA Acute Rehabilitation Services Secure Chat Preferred 9a-5:30pm Office: [place] 08/24/2022, 11:24 AM
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
## 2022-08-25 DIAGNOSIS — Z981 Arthrodesis status: Secondary | ICD-10-CM

## 2022-08-25 LAB — CBC WITH DIFFERENTIAL/PLATELET
Abs Immature Granulocytes: 0.07 10*3/uL (ref 0.00–0.07)
Basophils Absolute: 0 10*3/uL (ref 0.0–0.1)
Basophils Relative: 0 %
Eosinophils Absolute: 0.2 10*3/uL (ref 0.0–0.5)
Eosinophils Relative: 1 %
HCT: 23.9 % — ABNORMAL LOW (ref 36.0–46.0)
Hemoglobin: 8.2 g/dL — ABNORMAL LOW (ref 12.0–15.0)
Immature Granulocytes: 1 %
Lymphocytes Relative: 15 %
Lymphs Abs: 1.9 10*3/uL (ref 0.7–4.0)
MCH: 24.6 pg — ABNORMAL LOW (ref 26.0–34.0)
MCHC: 34.3 g/dL (ref 30.0–36.0)
MCV: 71.6 fL — ABNORMAL LOW (ref 80.0–100.0)
Monocytes Absolute: 0.8 10*3/uL (ref 0.1–1.0)
Monocytes Relative: 6 %
Neutro Abs: 9.5 10*3/uL — ABNORMAL HIGH (ref 1.7–7.7)
Neutrophils Relative %: 77 %
Platelets: 145 10*3/uL — ABNORMAL LOW (ref 150–400)
RBC: 3.34 MIL/uL — ABNORMAL LOW (ref 3.87–5.11)
RDW: 15 % (ref 11.5–15.5)
WBC: 12.4 10*3/uL — ABNORMAL HIGH (ref 4.0–10.5)
nRBC: 0 % (ref 0.0–0.2)

## 2022-08-25 LAB — MAGNESIUM: Magnesium: 1.6 mg/dL — ABNORMAL LOW (ref 1.7–2.4)

## 2022-08-25 LAB — URINALYSIS, ROUTINE W REFLEX MICROSCOPIC
Bacteria, UA: NONE SEEN
Bilirubin Urine: NEGATIVE
Glucose, UA: NEGATIVE mg/dL
Hgb urine dipstick: NEGATIVE
Ketones, ur: NEGATIVE mg/dL
Nitrite: NEGATIVE
Protein, ur: NEGATIVE mg/dL
Specific Gravity, Urine: 1.01 (ref 1.005–1.030)
pH: 5 (ref 5.0–8.0)

## 2022-08-25 LAB — COMPREHENSIVE METABOLIC PANEL
ALT: 28 U/L (ref 0–44)
AST: 24 U/L (ref 15–41)
Albumin: 2.6 g/dL — ABNORMAL LOW (ref 3.5–5.0)
Alkaline Phosphatase: 98 U/L (ref 38–126)
Anion gap: 8 (ref 5–15)
BUN: 11 mg/dL (ref 8–23)
CO2: 20 mmol/L — ABNORMAL LOW (ref 22–32)
Calcium: 8.5 mg/dL — ABNORMAL LOW (ref 8.9–10.3)
Chloride: 108 mmol/L (ref 98–111)
Creatinine, Ser: 0.96 mg/dL (ref 0.44–1.00)
GFR, Estimated: >60 mL/min (ref >60)
Glucose, Bld: 109 mg/dL — ABNORMAL HIGH (ref 70–99)
Potassium: 3.8 mmol/L (ref 3.5–5.1)
Sodium: 136 mmol/L (ref 135–145)
Total Bilirubin: 0.7 mg/dL (ref 0.3–1.2)
Total Protein: 5.5 g/dL — ABNORMAL LOW (ref 6.5–8.1)

## 2022-08-25 LAB — PHOSPHORUS: Phosphorus: 2.5 mg/dL (ref 2.5–4.6)

## 2022-08-25 LAB — GLUCOSE, CAPILLARY
Glucose-Capillary: 110 mg/dL — ABNORMAL HIGH (ref 70–99)
Glucose-Capillary: 168 mg/dL — ABNORMAL HIGH (ref 70–99)
Glucose-Capillary: 158 mg/dL — ABNORMAL HIGH (ref 70–99)

## 2022-08-25 MED ORDER — INSULIN ASPART 100 UNIT/ML IJ SOLN
0.0000 [IU] | Freq: Three times a day (TID) | INTRAMUSCULAR | Status: DC
  Administered 2022-08-25: 4 [IU] via SUBCUTANEOUS
  Administered 2022-08-25: 7 [IU] via SUBCUTANEOUS
  Administered 2022-08-26 (×2): 3 [IU] via SUBCUTANEOUS
  Administered 2022-08-26 – 2022-08-27 (×2): 4 [IU] via SUBCUTANEOUS
  Administered 2022-08-27: 3 [IU] via SUBCUTANEOUS
  Administered 2022-08-27: 4 [IU] via SUBCUTANEOUS
  Administered 2022-08-27 – 2022-08-28 (×3): 3 [IU] via SUBCUTANEOUS
  Administered 2022-08-28: 7 [IU] via SUBCUTANEOUS
  Administered 2022-08-29: 4 [IU] via SUBCUTANEOUS
  Administered 2022-08-29: 7 [IU] via SUBCUTANEOUS
  Administered 2022-08-29: 3 [IU] via SUBCUTANEOUS
  Administered 2022-08-30: 7 [IU] via SUBCUTANEOUS
  Administered 2022-08-30 (×3): 3 [IU] via SUBCUTANEOUS
  Administered 2022-08-31: 4 [IU] via SUBCUTANEOUS
  Administered 2022-08-31: 7 [IU] via SUBCUTANEOUS
  Administered 2022-08-31: 4 [IU] via SUBCUTANEOUS
  Administered 2022-08-31: 11 [IU] via SUBCUTANEOUS

## 2022-08-25 MED ORDER — INSULIN ASPART 100 UNIT/ML IJ SOLN: 0.0000 [IU] | Freq: Three times a day (TID) | INTRAMUSCULAR | Status: DC

## 2022-08-25 MED ORDER — SORBITOL 70 % SOLN
30.0000 mL | Freq: Once | Status: AC
  Administered 2022-08-25: 30 mL via ORAL
  Filled 2022-08-25: qty 30

## 2022-08-25 NOTE — Progress Notes (Signed)
Inpatient Rehabilitation Care Coordinator Assessment and Plan Patient Details  Name: Kara Kelley MRN: [id number] Date of Birth: Mar 05, 1956  Today's Date: 08/25/2022  Hospital Problems: Principal Problem:   Spinal stenosis, lumbar  Past Medical History:  Past Medical History:  Diagnosis Date   Anemia    Anxiety    Arthritis    Asthma    Bunion    Callus    Chronic pain    Cocaine abuse (HCC)    COPD (chronic obstructive pulmonary disease) (HCC)    Corns and callosities    Degenerative joint disease    Depression    Diabetes mellitus    Endometrial polyp    ETOH abuse    Gall stones    GERD (gastroesophageal reflux disease)    Headache    history of Migraines   Hepatitis C    Hep C   Hyperlipidemia    Hypertension    Insomnia    Spondylolisthesis of lumbar region    Substance abuse (HCC)    alcoholism   Tuberculosis 1985   Wears dentures    Wears glasses    Past Surgical History:  Past Surgical History:  Procedure Laterality Date   ANTERIOR CERVICAL DECOMP/DISCECTOMY FUSION N/A 10/26/2021   Procedure: ACDF - C4-C5 - C5-C6 - C6-C7;  Surgeon: Julio Sicks, MD;  Location: [facility] OR;  Service: Neurosurgery;  Laterality: N/A;   BACK SURGERY  2018   CESAREAN SECTION     x3   CHOLECYSTECTOMY N/A 08/11/2016   Procedure: LAPAROSCOPIC CHOLECYSTECTOMY WITH   INTRAOPERATIVE CHOLANGIOGRAM;  Surgeon: Almond Lint, MD;  Location: [facility];  Service: General;  Laterality: N/A;   COLONOSCOPY     Cotton Osteotomy w/ Graft Left 06/18/2009   Excision of Benign Lesion Right 01/30/2013   Rt Plantar   FOOT SURGERY     HAMMER TOE REPAIR Right 02/12/2016   RIGHT #5   Hammertoe Repair Left 06/18/2009   Lt #5   HYSTEROSCOPY N/A 06/20/2017   Procedure: DILATION AND CURETTAGE, HYSTEROSCOPY w/ Polypectomy;  Surgeon: Willodean Rosenthal, MD;  Location: [facility];  Service: Gynecology;  Laterality: N/A;   LUMBAR FUSION  2018   METATARSAL OSTEOTOMY Left 06/18/2009   #5   MULTIPLE TOOTH  EXTRACTIONS     Nail Matrixectomy Left 06/18/2009   LT #1   OSTEOTOMY Right 01/30/2013   Rt #5   Phalangectomy Left 06/18/2009   LT #1   Phalangectomy Right 01/30/2013   Rt #1   TUBAL LIGATION     Social History:  reports that she quit smoking about 3 years ago. Her smoking use included cigarettes. She has a 35.25 pack-year smoking history. She has been exposed to tobacco smoke. She has never used smokeless tobacco. She reports that she does not drink alcohol and does not use drugs.  Family / Support Systems Marital Status: Single Patient Roles: Parent, Other (Comment) (sibling) Children: Alnisa-daughter [phone]  Tiffany-daughter [phone] another daughter also-all are local Other Supports: Adelina Mings [phone] Anticipated Caregiver: Daughter's Ability/Limitations of Caregiver: All work, but will figure out care with work schedules Caregiver Availability: 24/7 Family Dynamics: Close with three daughter's and sister, she was doing well at home until she started falling. She hopes she can do well and become mod/i again  Social History Preferred language: English Religion: Pentecostal Cultural Background: No issues Education: HS Health Literacy - How often do you need to have someone help you when you read instructions, pamphlets, or other written material from your doctor or pharmacy?: Never  Writes: Yes Employment Status: Disabled Marine scientist Issues: No issues Guardian/Conservator: None-according to MD pt is capable of making her own decisions while here   Abuse/Neglect Abuse/Neglect Assessment Can Be Completed: Yes Physical Abuse: Denies Verbal Abuse: Denies Sexual Abuse: Denies Exploitation of patient/patient's resources: Denies Self-Neglect: Denies  Patient response to: Social Isolation - How often do you feel lonely or isolated from those around you?: Never  Emotional Status Pt's affect, behavior and adjustment status: Pt is motivated to recover from her  back surgery but is tired and in pain today. She hopes her day goes well and is somewhat nervous regarding first day on rehab. Recent Psychosocial Issues: other health issues Psychiatric History: Hx-depression/anxiety takes medications for this and she feels they are helpful Substance Abuse History: history of polysubstance abuse-pt reports quit ETOH and tobacco  Patient / Family Perceptions, Expectations & Goals Pt/Family understanding of illness & functional limitations: Pt is able to explain her back surgery and her precautions. She is having pain and hopes this will get better while here. She did talk with the MD and feels she has a good understanding of her treatment plan moving forward. Premorbid pt/family roles/activities: mom, sister, retiree, neighbor, church member Anticipated changes in roles/activities/participation: resume Pt/family expectations/goals: Pt states: " I hope to get better and be like I was before this."  [employer]: None Premorbid Home Care/DME Agencies: Other (Comment) (cane and rollator) Transportation available at discharge: pt drove prior to admission-will have daughter's Is the patient able to respond to transportation needs?: Yes In the past 12 months, has lack of transportation kept you from medical appointments or from getting medications?: No In the past 12 months, has lack of transportation kept you from meetings, work, or from getting things needed for daily living?: No Resource referrals recommended: Neuropsychology  Discharge Planning Living Arrangements: Alone Support Systems: Children, Other relatives, Friends/neighbors, Church/faith community Type of Residence: Private residence Insurance Resources: [employer] (specify county) Surveyor, quantity Resources: [employer] Financial Screen Referred: Previously completed Living Expenses: Psychologist, sport and exercise Management: Patient Does the patient have any problems obtaining your medications?: No Home  Management: self Patient/Family Preliminary Plans: Return home with daughter's coming in to assist and provide supervision level. She hopes she can get back to using a cane or rollator. With her leg weakness she was falling a lot prior to admission. Care Coordinator Barriers to Discharge: Insurance for SNF coverage Care Coordinator Anticipated Follow Up Needs: HH/OP  Clinical Impression Pleasant female who is motivated to do well and recover from her back surgery. Her daughter's are very involved and will assist with her care at discharge. She hopes she will not need any and does not want to burden her daughter's they do work. Await therapy evaluations  Lucy Chris 08/25/2022, 12:40 PM

## 2022-08-25 NOTE — Progress Notes (Signed)
Inpatient Rehabilitation Admission Medication Review by a Pharmacist  A complete drug regimen review was completed for this patient to identify any potential clinically significant medication issues.  High Risk Drug Classes Is patient taking? Indication by Medication  Antipsychotic Yes Aripiprazole fpr  Prochlorperazine for nausea   Anticoagulant No   Antibiotic No   Opioid Yes Hydrocodone for acute and chronic pain   Antiplatelet No   Hypoglycemics/insulin Yes Insulin aspart and insulin detemir for DMT2   Vasoactive Medication No   Chemotherapy No   Other Yes Atorvastatin for HLD Bupropion for depression Methimazole for hyperthyroidism Pantoprazole for GERD Trazodone and zolpidem for insomnia  Acetaminophen for mild pain Hydroxyzine for anxiety  Methocarbamol for muscle spasms      Type of Medication Issue Identified Description of Issue Recommendation(s)  Drug Interaction(s) (clinically significant)     Duplicate Therapy     Allergy     No Medication Administration End Date     Incorrect Dose     Additional Drug Therapy Needed  Home advair 250/50mg  for asthma held  Consider resuming   Significant med changes from prior encounter (inform family/care partners about these prior to discharge). DECREASED bupropion (patient stated taking 300mg  BID, which is above recommended max dose)  HELD home Advair discus, glucagon, linzess, lisinopril, meloxicam, metformin, multivitamin, promethazine, ozempic, suvorexant, spironolactone, vitamin D  Continue on decreased dose given stated home dose is above recommended max dosing  Resume home meds as clinically appropriate   Other  Methocarbamol may contribute to falls and it is not a home med Consider stopping methocarbamol. Can resuming home meloxicam for pain control     Clinically significant medication issues were identified that warrant physician communication and completion of prescribed/recommended actions by midnight of the  next day:  No  Name of provider notified for urgent issues identified:   Provider Method of Notification:     Pharmacist comments:   Time spent performing this drug regimen review (minutes):  25  , PharmD, BCPS, [facility] Clinical Pharmacist  Please check AMION for all [facility] phone numbers After 10:00 PM, call Main Pharmacy [phone]

## 2022-08-25 NOTE — Progress Notes (Signed)
Checked on patient to make sure she was comfortable and had no concerns. Patient resting, denies any needs, call bell in reach, bed alarm on.

## 2022-08-25 NOTE — Progress Notes (Signed)
Inpatient Rehabilitation Center Individual Statement of Services  Patient Name:  Kara Kelley  Date:  08/25/2022  Welcome to the Inpatient Rehabilitation Center.  Our goal is to provide you with an individualized program based on your diagnosis and situation, designed to meet your specific needs.  With this comprehensive rehabilitation program, you will be expected to participate in at least 3 hours of rehabilitation therapies Monday-Friday, with modified therapy programming on the weekends.  Your rehabilitation program will include the following services:  Physical Therapy (PT), Occupational Therapy (OT), 24 hour per day rehabilitation nursing, Neuropsychology, Care Coordinator, Rehabilitation Medicine, Nutrition Services, and Pharmacy Services  Weekly team conferences will be held on Tuesday to discuss your progress.  Your Inpatient Rehabilitation Care Coordinator will talk with you frequently to get your input and to update you on team discussions.  Team conferences with you and your family in attendance may also be held.  Expected length of stay: 7-10 days  Overall anticipated outcome: mod/I level  Depending on your progress and recovery, your program may change. Your Inpatient Rehabilitation Care Coordinator will coordinate services and will keep you informed of any changes. Your Inpatient Rehabilitation Care Coordinator's name and contact numbers are listed  below.  The following services may also be recommended but are not provided by the Inpatient Rehabilitation Center:  Driving Evaluations Home Health Rehabiltiation Services Outpatient Rehabilitation Services    Arrangements will be made to provide these services after discharge if needed.  Arrangements include referral to agencies that provide these services.  Your insurance has been verified to be:  Medicaid Your primary doctor is:  Amy Zonia Kief  Pertinent information will be shared with your doctor and your insurance  company.  Inpatient Rehabilitation Care Coordinator:  Dossie Der, [place] [phone] or Luna Glasgow  Information discussed with and copy given to patient by: Lucy Chris, 08/25/2022, 12:46 PM

## 2022-08-25 NOTE — Progress Notes (Signed)
Physical Therapy Assessment and Plan  Patient Details  Name: Kara Kelley MRN: [id number] Date of Birth: 1956/07/19  PT Diagnosis: Coordination disorder, Difficulty walking, Impaired sensation, Low back pain, and Post laminectomy syndrom Rehab Potential: Good ELOS: 7-10 days   Today's Date: 08/25/2022 PT Individual Time: 1601-0932 PT Individual Time Calculation (min): 72 min    Hospital Problem: Principal Problem:   Spinal stenosis, lumbar   Past Medical History:  Past Medical History:  Diagnosis Date   Anemia    Anxiety    Arthritis    Asthma    Bunion    Callus    Chronic pain    Cocaine abuse ([place])    COPD (chronic obstructive pulmonary disease) ([place])    Corns and callosities    Degenerative joint disease    Depression    Diabetes mellitus    Endometrial polyp    ETOH abuse    Gall stones    GERD (gastroesophageal reflux disease)    Headache    history of Migraines   Hepatitis C    Hep C   Hyperlipidemia    Hypertension    Insomnia    Spondylolisthesis of lumbar region    Substance abuse ([place])    alcoholism   Tuberculosis 1985   Wears dentures    Wears glasses    Past Surgical History:  Past Surgical History:  Procedure Laterality Date   ANTERIOR CERVICAL DECOMP/DISCECTOMY FUSION N/A 10/26/2021   Procedure: ACDF - C4-C5 - C5-C6 - C6-C7;  Surgeon: Earnie Larsson, MD;  Location: [facility];  Service: Neurosurgery;  Laterality: N/A;   BACK SURGERY  2018   CESAREAN SECTION     x3   CHOLECYSTECTOMY N/A 08/11/2016   Procedure: LAPAROSCOPIC CHOLECYSTECTOMY WITH   INTRAOPERATIVE CHOLANGIOGRAM;  Surgeon: Stark Klein, MD;  Location: [facility];  Service: General;  Laterality: N/A;   COLONOSCOPY     [place] Osteotomy w/ Graft Left 06/18/2009   Excision of Benign Lesion Right 01/30/2013   Rt Plantar   FOOT SURGERY     HAMMER TOE REPAIR Right 02/12/2016   RIGHT #5   Hammertoe Repair Left 06/18/2009   Lt #5   HYSTEROSCOPY N/A 06/20/2017   Procedure: DILATION AND  CURETTAGE, HYSTEROSCOPY w/ Polypectomy;  Surgeon: Lavonia Drafts, MD;  Location: [facility];  Service: Gynecology;  Laterality: N/A;   LUMBAR FUSION  2018   METATARSAL OSTEOTOMY Left 06/18/2009   #5   MULTIPLE TOOTH EXTRACTIONS     Nail Matrixectomy Left 06/18/2009   LT #1   OSTEOTOMY Right 01/30/2013   Rt #5   Phalangectomy Left 06/18/2009   LT #1   Phalangectomy Right 01/30/2013   Rt #1   TUBAL LIGATION      Assessment & Plan Clinical Impression: Patient is a 66 y.o. year old female who resented to [facility] emergency department on 11/10 after reporting several falls and bilateral lower extremity weakness.  She is status post L4-5 and L5-S1 decompression and fusion surgery in 2018 and in 2021, respectively.   She also had C4-C7 ACDF in January 2023 by Dr. Annette Stable.  Transferred to [facility]. Neurosurgery was consulted and MRI performed. She is followed by Dr. Trenton Gammon who noted severe bilateral lower extremity weakness which left her nonambulatory.  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  She had failed conservative management including therapy and IV steroids.  She she was unable to stand or ambulate.  She was also reporting difficulty with  bowel and bladder function.  She was taken to the operating room on 11/13 and underwent bilateral L3-4 decompressive laminectomies and foraminectomies, as well as posterior lumbar interbody fusion and arthrodesis. Treated for hyperkalemia.  Elevated serum creatinine consistent with prerenal acute kidney injury.  Her baseline creatinine is approximately 0.9-1.0.  She was started on IV fluids 100 cc/h and these will continue.  Good urine output.  She reports weakness in her legs L>R. She denies numbness or tingling.  She continues to pain in her back and legs. Her dressing is clean and dry per neurosurgery. The patient requires inpatient physical medicine and rehabilitation evaluations and treatment secondary  to dysfunction due to for spinal stenosis status post decompression.   Patient currently requires min with mobility secondary to muscle weakness, decreased cardiorespiratoy endurance, and decreased standing balance and decreased postural control.  Prior to hospitalization, patient was independent  with mobility and lived with Alone in a [place] home.  Home access is  Ramped entrance.  Patient will benefit from skilled PT intervention to maximize safe functional mobility, minimize fall risk, and decrease caregiver burden for planned discharge home with 24 hour supervision.  Anticipate patient will benefit from follow up [facility] at discharge.  PT - End of Session Activity Tolerance: Tolerates 30+ min activity with multiple rests Endurance Deficit: Yes Endurance Deficit Description: decreased PT Assessment Rehab Potential (ACUTE/IP ONLY): Good PT Barriers to Discharge: Other (comments) PT Barriers to Discharge Comments: alone, apartment with ramped entrance with 24/7 care from daughters PT Patient demonstrates impairments in the following area(s): Balance;Endurance;Motor;Pain;Sensory PT Transfers Functional Problem(s): Bed Mobility;Bed to Chair;Car PT Locomotion Functional Problem(s): Ambulation;Wheelchair Mobility PT Plan PT Intensity: Minimum of 1-2 x/day ,45 to 90 minutes PT Frequency: 5 out of 7 days PT Duration Estimated Length of Stay: 7-10 days PT Treatment/Interventions: Ambulation/gait training;Discharge planning;Functional mobility training;Therapeutic Activities;Balance/vestibular training;Disease management/prevention;Therapeutic Exercise;Wheelchair propulsion/positioning;DME/adaptive equipment instruction;Pain management;UE/LE Strength taining/ROM;Community reintegration;Patient/family education;Stair training;UE/LE Coordination activities PT Transfers Anticipated Outcome(s): Mod I PT Locomotion Anticipated Outcome(s): Mod I PT Recommendation Recommendations for Other Services:   (TBD) Follow Up Recommendations: Home health PT Patient destination: Home Equipment Recommended: To be determined Equipment Details: owns rollator and single point cane   PT Evaluation Precautions/Restrictions Precautions Precautions: Fall;Back Precaution Booklet Issued: Yes (comment) Precaution Comments: reviewed back precautions, proper brace wear, and proper posture Required Braces or Orthoses: Spinal Brace Spinal Brace: Lumbar corset;Applied in sitting position Restrictions Weight Bearing Restrictions: No Pain Interference Pain Interference Pain Effect on Sleep: 8. Unable to answer Pain Interference with Therapy Activities: 1. Rarely or not at all Pain Interference with Day-to-Day Activities: 2. Occasionally Home Living/Prior Functioning Home Living Available Help at Discharge: Family;Available 24 hours/day;Other (Comment) (2 daughter) Type of Home: Apartment Home Access: Ramped entrance Home Layout: One level Bathroom Shower/Tub: Chiropodist: Standard Bathroom Accessibility: Yes  Lives With: Alone Prior Function Level of Independence: Independent with basic ADLs;Independent with transfers;Independent with gait  Able to Take Stairs?: Yes Driving: Yes Vocation: On disability Cognition Overall Cognitive Status: Within Functional Limits for tasks assessed Arousal/Alertness: Awake/alert Orientation Level: Oriented X4 Attention: Sustained Memory: Impaired Memory Impairment: Decreased recall of new information Awareness: Impaired Problem Solving: Appears intact Safety/Judgment: Appears intact Sensation Sensation Light Touch: Impaired by gross assessment Proprioception: Appears Intact Additional Comments: grossly diminshed sensation to light touch on left Coordination Gross Motor Movements are Fluid and Coordinated: No Fine Motor Movements are Fluid and Coordinated: No Coordination and Movement Description: grossly uncoordinated due to strength  and activity tolerance deficits Finger  Nose Finger Test: L UE dysmetria Heel Shin Test: bilateral dysmetria Motor  Motor Motor: Other (comment) (generalized weakness and debility) Motor - Skilled Clinical Observations: generalized strength and balance/coordiantion deficits   Trunk/Postural Assessment  Cervical Assessment Cervical Assessment: Within Functional Limits Thoracic Assessment Thoracic Assessment: Within Functional Limits Lumbar Assessment Lumbar Assessment: Exceptions to [facility] (posterior pelvic tilt) Postural Control Postural Control: Deficits on evaluation Righting Reactions: delayed  Balance Balance Balance Assessed: Yes Static Sitting Balance Static Sitting - Balance Support: Feet supported Static Sitting - Level of Assistance: 5: Stand by assistance Dynamic Sitting Balance Dynamic Sitting - Balance Support: Feet supported;During functional activity Dynamic Sitting - Level of Assistance: 5: Stand by assistance Static Standing Balance Static Standing - Balance Support: Bilateral upper extremity supported Static Standing - Level of Assistance: 4: Min assist Dynamic Standing Balance Dynamic Standing - Balance Support: Bilateral upper extremity supported;During functional activity Dynamic Standing - Level of Assistance: 4: Min assist Extremity Assessment      RLE Assessment RLE Assessment: Exceptions to [facility] Strength Comments: hip flexion, hip extension and knee extension 3+/5. knee flexion 4/5, ankle DF 2/5, ankle PF 3/5 LLE Assessment LLE Assessment: Exceptions to [facility] Strength Comments: hip flexion, hip extension 3+/5,  knee extension 3-/5. knee flexion 4/5, ankle DF 2/5, ankle PF 3/5  Care Tool Care Tool Bed Mobility Roll left and right activity   Roll left and right assist level: Minimal Assistance - Patient > 75%    Sit to lying activity   Sit to lying assist level: Minimal Assistance - Patient > 75%    Lying to sitting on side of bed  activity   Lying to sitting on side of bed assist level: the ability to move from lying on the back to sitting on the side of the bed with no back support.: Contact Guard/Touching assist     Care Tool Transfers Sit to stand transfer   Sit to stand assist level: Contact Guard/Touching assist    Chair/bed transfer   Chair/bed transfer assist level: Minimal Assistance - Patient > 75%     Toilet transfer   Assist Level: Minimal Assistance - Patient > 75%    Car transfer   Car transfer assist level: Minimal Assistance - Patient > 75%      Care Tool Locomotion Ambulation   Assist level: Minimal Assistance - Patient > 75% Assistive device: Other (comment) (handrail) Max distance: 20 ft  Walk 10 feet activity   Assist level: Minimal Assistance - Patient > 75% Assistive device: Other (comment) (handrail)   Walk 50 feet with 2 turns activity Walk 50 feet with 2 turns activity did not occur: Safety/medical concerns (unable to perform due to strength and activity tolerance deficits)      Walk 150 feet activity Walk 150 feet activity did not occur: Safety/medical concerns (unable to perform due to strength and activity tolerance deficits)      Walk 10 feet on uneven surfaces activity   Assist level: Minimal Assistance - Patient > 75% Assistive device: Other (comment) (handrail)  Stairs   Assist level: Contact Guard/Touching assist Stairs assistive device: 2 hand rails Max number of stairs: 4 (6 inches)  Walk up/down 1 step activity   Walk up/down 1 step (curb) assist level: Contact Guard/Touching assist Walk up/down 1 step or curb assistive device: 2 hand rails  Walk up/down 4 steps activity   Walk up/down 4 steps assist level: Contact Guard/Touching assist Walk up/down 4 steps assistive device: 2 hand rails  Walk up/down 12 steps activity Walk up/down 12 steps activity did not occur: Safety/medical concerns (unable to perform due to activty tolerance deficits)      Pick up small  objects from floor Pick up small object from the floor (from standing position) activity did not occur: Safety/medical concerns (unable to perform due to balance/coordination deficits and spine precautions)      Wheelchair Is the patient using a wheelchair?: Yes Type of Wheelchair: Manual   Wheelchair assist level: Minimal Assistance - Patient > 75% Max wheelchair distance: 150 ft  Wheel 50 feet with 2 turns activity   Assist Level: Minimal Assistance - Patient > 75%  Wheel 150 feet activity   Assist Level: Minimal Assistance - Patient > 75%    Refer to Care Plan for Long Term Goals  SHORT TERM GOAL WEEK 1 PT Short Term Goal 1 (Week 1): STG=LTG due ELOS  Recommendations for other services: None   Skilled Therapeutic Intervention  Daily Treatment Session:  Pt received seated in w/c at bedside and agreeable to PT evaluation. PT obtained subjective history and assessed pain interference, sensation, coordination, strength and balance. Pt requires CGA with sit to stand and min A with stand pivot transfer with no AD. Pt transported to ortho gym for time management and energy conservation and requires min A for stand pivot car transfer. Pt ambulated ~20 ft up and down ramp with 1 handrail and requires min A for balance. Pt requires min A for sit to lying on mat and CGA with rolling and supine to sit edge of mat. Pt ambulated 78 feet with RW and requires CGA for safety. Pt propelled w/c ~150 ft with min A for obstacle negotiation. Pt navigated 4 steps with 2 HR's with CGA and declines further stair negotiation. Pt transported to room and left seated in w/c at bedside with all needs in reach and chair alarm on. PT updated safety plan and educated pt regarding CIR policies and procedures.    Mobility Bed Mobility Bed Mobility: Rolling Right;Rolling Left;Supine to Sit;Sit to Supine Rolling Right: Contact Guard/Touching assist Rolling Left: Contact Guard/Touching assist Supine to Sit: Contact  Guard/Touching assist Sit to Supine: Minimal Assistance - Patient > 75% Transfers Transfers: Sit to Stand;Stand to Sit;Stand Pivot Transfers Sit to Stand: Contact Guard/Touching assist Stand to Sit: Contact Guard/Touching assist Stand Pivot Transfers: Minimal Assistance - Patient > 75% Stand Pivot Transfer Details: Verbal cues for precautions/safety;Verbal cues for sequencing Transfer (Assistive device): None Locomotion  Gait Ambulation: Yes Gait Assistance: Minimal Assistance - Patient > 75% Gait Distance (Feet): 20 Feet (feet) Assistive device: Other (Comment) (handrail) Gait Assistance Details: Verbal cues for precautions/safety Gait Gait: Yes Gait Pattern: Impaired Gait Pattern: Decreased stance time - right;Decreased stance time - left;Shuffle;Narrow base of support Gait velocity: decreased Stairs / Additional Locomotion Stairs: Yes Stairs Assistance: Contact Guard/Touching assist Stair Management Technique: Two rails Number of Stairs: 4 Height of Stairs: 6 (inches) Ramp: Minimal Assistance - Patient >75% Curb: Nurse, mental health Mobility: Yes Wheelchair Propulsion: Both upper extremities Wheelchair Parts Management: Needs assistance Distance: 150 ft   Discharge Criteria: Patient will be discharged from PT if patient refuses treatment 3 consecutive times without medical reason, if treatment goals not met, if there is a change in medical status, if patient makes no progress towards goals or if patient is discharged from hospital.  The above assessment, treatment plan, treatment alternatives and goals were discussed and mutually agreed upon: by patient  Corey Harold  Len Childs PT, DPT  08/25/2022, 11:53 AM

## 2022-08-25 NOTE — Progress Notes (Signed)
Inpatient Rehabilitation  Patient information reviewed and entered into eRehab system by Siearra Amberg Castiel Lauricella, OTR/L, Rehab Quality Coordinator.   Information including medical coding, functional ability and quality indicators will be reviewed and updated through discharge.   

## 2022-08-25 NOTE — Progress Notes (Signed)
PROGRESS NOTE   Subjective/Complaints:  Pt reports pain "up and down"- right now is 8/10- hasn't asked ofr pain meds.   WBC up to 12.5-  And says doesn't feel "great' this Am- couldn't describe why- sounds a little cognitively confused.  Also hasn't had a BM in 1 week per pt- feels constipated ROS: . Pt denies SOB, abd pain, CP, N/V/ (+) C/D, and vision changes Except per HPI  Objective:   No results found. Recent Labs    08/25/22 0530  WBC 12.4*  HGB 8.2*  HCT 23.9*  PLT 145*   Recent Labs    08/23/22 0326 08/25/22 0530  NA 135 136  K 4.6 3.8  CL 108 108  CO2 16* 20*  GLUCOSE 216* 109*  BUN 35* 11  CREATININE 1.32* 0.96  CALCIUM 8.5* 8.5*    Intake/Output Summary (Last 24 hours) at 08/25/2022 1013 Last data filed at 08/25/2022 0845 Gross per 24 hour  Intake 205.64 ml  Output --  Net 205.64 ml        Physical Exam: Vital Signs Blood pressure (!) 145/70, pulse 100, temperature 98.5 F (36.9 C), temperature source Oral, resp. rate 16, height 4\' 11"  (1.499 m), weight 78 kg, SpO2 95 %.     General: awake, alert, appropriate, sitting up in bed; cannot find phone; NAD HENT: conjugate gaze; oropharynx moist CV: regular to borderline tachycardic  rate; no JVD Pulmonary: CTA B/L; no W/R/R- good air movement GI: more firm; NT; but somewhat distended; hypoactive BS Psychiatric: appropriate Neurological: Ox3- delayed responses- didn't recognize how to call for nursing Skin: Clean and intact without signs of breakdown, honeycomb dressing and foam border dressing on L spine clean and dry Neuro:  Alert and oriented x4, follows simple commands, CN 2-12 intact, speech fluent Sensation intact to LT in all 4 extremities Strength 4/5 in b/l UE Strength 1-2 proximal RLE and 3/5 ankle PF and 1-2/5 DF  Strength 2-3 proximal LLE and 3/5 ankle PF and 2/5 DF  FTN intact b/l Musculoskeletal: No abnormal tone noted,  no joint swelling noted  Assessment/Plan: 1. Functional deficits which require 3+ hours per day of interdisciplinary therapy in a comprehensive inpatient rehab setting. Physiatrist is providing close team supervision and 24 hour management of active medical problems listed below. Physiatrist and rehab team continue to assess barriers to discharge/monitor patient progress toward functional and medical goals  Care Tool:  Bathing              Bathing assist       Upper Body Dressing/Undressing Upper body dressing        Upper body assist      Lower Body Dressing/Undressing Lower body dressing            Lower body assist       Toileting Toileting    Toileting assist Assist for toileting: Contact Guard/Touching assist     Transfers Chair/bed transfer  Transfers assist           Locomotion Ambulation   Ambulation assist              Walk 10 feet activity   Assist  Walk 50 feet activity   Assist           Walk 150 feet activity   Assist           Walk 10 feet on uneven surface  activity   Assist           Wheelchair     Assist               Wheelchair 50 feet with 2 turns activity    Assist            Wheelchair 150 feet activity     Assist          Blood pressure (!) 145/70, pulse 100, temperature 98.5 F (36.9 C), temperature source Oral, resp. rate 16, height 4\' 11"  (1.499 m), weight 78 kg, SpO2 95 %.  Medical Problem List and Plan: 1. Functional deficits secondary to severe L3-4 severe stenosis s/p  L3-4 decompression and fusion surgery              -patient may shower cover incisions             -ELOS/Goals: 7-10 days, supervision with PT/OT             -Admit to CIR, PT/OT Evals  Con't CIR- Firs t day of evals-PT and OT 2.  Antithrombotics: -DVT/anticoagulation:  Mechanical: Sequential compression devices, below knee Bilateral lower extremitiesTED hose              -antiplatelet therapy: none 3. Pain Management: Tylenol, Norco, Valium             - Polysubstance abuse history, 4. Mood/Behavior/Sleep: LCSW to evaluate and provide emotional support             -depression: continue Abilify, Wellbutrin, trazodone             -anxiety: continue Atarax prn             -antipsychotic agents: n/a 5. Neuropsych/cognition: This patient is capable of making decisions on her own behalf. 6. Skin/Wound Care: routine skin care checks; monitor surgical incision- has honeycomb dressing in place 7. Fluids/Electrolytes/Nutrition: Routine Is and Os and follow-up chemistries 8: DM: CBGs q AC and q HS (home>>Levemir 32 units q HS, metformin 500 mg daily, Novolog 18 units TID with meals). Hypoglycemia was worsened by recent steroid treatment but appears to be improving.              -continue SSI q AC and q HS             -continue Levemir 15 units BID  11/16- BG's controlled- con't regimen 9: Obesity: BMI = 34.34 10: Hypertension: monitor TID and prn             -continue lisinopril 10 mg daily             -spironolactone on hold 11: Hyperlipidemia: continue Lipitor 10mg  daily 12: Asthma/COPD: continue albuterol inhaler prn 13: Hyperthyroidism: continue methimazole 14: Gastroesophageal reflux disease: continue Protonix 15: Insomnia: continue Ambien 5 mg q HS prn (on trazodone 150 mg q HS) 16: Anemia: hemoglobin stable at 10.2; follow-up CBC             -continue iron supplements 17: AKI, metabolic acidosis: pre-renal. Continue IVFs and follow-up BMP, last Cr 1.32  11/16- Cr back to baseline- con't to monitor 18. Hyperkalemia, improved with lokelma, spironolactone on hold             -Recheck levels  tomorrow  11/16- K+ 3.8- doing better 19. Confusion-with leukocytosis- mild to moderate  11/16- will check U/A and Cx esp since WBC is 12.6-  20. Constipation  11/16- LBM 1 week ago- said hasn't eatne much, but constipated- will do Sorbitol and SSE after therapy    I  spent a total of 36   minutes on total care today- >50% coordination of care- due to d/w PA-work up on confusion and leukocytosis and constipation   LOS: 1 days A FACE TO FACE EVALUATION WAS PERFORMED  Miguel Medal 08/25/2022, 10:13 AM

## 2022-08-25 NOTE — Discharge Summary (Signed)
Physician Discharge Summary  Patient ID: Kara Kelley MRN: [id number] DOB/AGE: 10/16/1955 66 y.o.  Admit date: 08/24/2022 Discharge date: 09/01/2022  Discharge Diagnoses:  Principal Problem:   Spinal stenosis, lumbar Active problems: Debility secondary to lumbar stenosis   Discharged Condition: good  Significant Diagnostic Studies:  Narrative & Impression  CLINICAL DATA:  Abdominal pain mainly in right lower quadrant.   EXAM: ABDOMEN - 2 VIEW   COMPARISON:  None Available.   FINDINGS: No evidence of dilated bowel loops or free intraperitoneal air. Right upper quadrant surgical clips are seen. Lumbar spine fusion hardware also noted.   IMPRESSION: No acute findings.     Electronically Signed   By: Marlaine Hind M.D.   On: 08/28/2022 17:24   Labs:  Basic Metabolic Panel: Recent Labs  Lab 08/28/22 1447 08/29/22 0449  NA 135 138  K 3.8 4.0  CL 103 109  CO2 20* 22  GLUCOSE 191* 153*  BUN 6* 7*  CREATININE 0.85 0.92  CALCIUM 8.8* 8.7*    CBC: Recent Labs  Lab 08/28/22 1447 08/29/22 0449  WBC 8.9 8.9  NEUTROABS 6.4  --   HGB 8.3* 7.7*  HCT 25.2* 23.3*  MCV 72.8* 72.6*  PLT 219 210    CBG: Recent Labs  Lab 08/30/22 2049 08/31/22 0613 08/31/22 1801 08/31/22 2108 09/01/22 0627  GLUCAP 228* 168* 204* 290* 89    Brief HPI:   Kara Kelley is a 66 y.o. female  who resented to [facility] emergency department on 11/10 after reporting several falls and bilateral lower extremity weakness.  She is status post L4-5 and L5-S1 decompression and fusion surgery in 2018 and in 2021, respectively.   She also had C4-C7 ACDF in January 2023 by Dr. Annette Stable.  Transferred to [facility]. Neurosurgery was consulted and MRI performed. She is followed by Dr. Trenton Gammon who noted severe bilateral lower extremity weakness which left her nonambulatory.  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  She had failed  conservative management including therapy and IV steroids.  She she was unable to stand or ambulate.  She was also reporting difficulty with bowel and bladder function.  She was taken to the operating room on 11/13 and underwent bilateral L3-4 decompressive laminectomies and foraminectomies, as well as posterior lumbar interbody fusion and arthrodesis. Treated for hyperkalemia.  Elevated serum creatinine consistent with prerenal acute kidney injury.  Her baseline creatinine is approximately 0.9-1.0.  She was started on IV fluids 100 cc/h and these will continue.    Hospital Course: Kara Kelley was admitted to rehab 08/24/2022 for inpatient therapies to consist of PT, ST and OT at least three hours five days a week. Past admission physiatrist, therapy team and rehab RN have worked together to provide customized collaborative inpatient rehab. Noted to have mild leukocytosis, confusion. Started on Keflex for 5 days on 26/71 for uncomplicated UTI. Surgical dressing removed with appearance of good healing. IVFs were discontinued on 11/18. Complained of abdominal and RLE pain on 11/18. Abdominal films negative.  Follow-up labs with normal WBC, BUN and Cr. Hemoglobin trending down and 7.7 on 11/20.  Blood pressures were monitored on TID basis and remained stable on lisinopril 10 mg.  Diabetes has been monitored with ac/hs CBG checks and SSI was use prn for tighter BS control. Levemir 15 units BID continued. Added 5 units of Novolog at discharge for meal coverage.   Rehab course: During patient's stay in rehab weekly team  conferences were held to monitor patient's progress, set goals and discuss barriers to discharge. At admission, patient required mod assist with basic self-care skills, min assist with mobility.  She has had improvement in activity tolerance, balance, postural control as well as ability to compensate for deficits. She has had improvement in functional use RUE/LUE  and RLE/LLE as well as  improvement in awareness. 11/22:  Pt started to stand up to RW prior to donning TLSO requiring cuing to remember to don it first. Sit<>stands using RW mod-I during session.  Gait ~172f to main therapy gym using RW mod-I - pt demos decreased B LE foot clearance during swing and slower but adequate gait speed - required 1x standing rest break due to pt feeling SOB, monitored SpO2 and HR to be WNL during.   Disposition: Home There are no questions and answers to display.      Diet: carb modified  Special Instructions: No driving, alcohol consumption or tobacco use.  Follow-up with orthopedic surgeon regarding knee pain.  Rec: carb counting and fingerstick glucose testing 4 times daily and follow-up with PCP.   30-35 minutes were spent on discharge planning and discharge summary. Discharge Instructions     Ambulatory referral to Occupational Therapy   Complete by: As directed    Eval and treat   Ambulatory referral to Physical Medicine Rehab   Complete by: As directed    Hospital follow-up   Ambulatory referral to Physical Therapy   Complete by: As directed    Eval and treat      Allergies as of 09/01/2022   No Known Allergies      Medication List     STOP taking these medications    fluticasone 50 MCG/ACT nasal spray Commonly known as: FLONASE       TAKE these medications    Accu-Chek Guide Me w/Device Kit Use to check blood sugars twice daily E11.69   Accu-Chek Guide test strip Generic drug: glucose blood Use as directed to check blood sugars 2 times per day dx: e11.69   Accu-Chek Softclix Lancets lancets Use as instructed to check blood sugars twice daily E11.69   Accu-Chek FastClix Lancets Misc Use as directed to check blood sugars 2 times per day dx: e11.65   acetaminophen 325 MG tablet Commonly known as: TYLENOL Take 1-2 tablets (325-650 mg total) by mouth every 4 (four) hours as needed for mild pain.   albuterol 108 (90 Base) MCG/ACT  inhaler Commonly known as: VENTOLIN HFA Inhale 2 puffs into the lungs every 6 (six) hours as needed for wheezing or shortness of breath. What changed: when to take this   ARIPiprazole 10 MG tablet Commonly known as: ABILIFY Take 10 mg by mouth daily.   atorvastatin 10 MG tablet Commonly known as: LIPITOR Take 1 tablet (10 mg total) by mouth daily.   Belsomra 5 MG Tabs Generic drug: Suvorexant Take 5 mg by mouth at bedtime.   buPROPion 300 MG 24 hr tablet Commonly known as: WELLBUTRIN XL Take 1 tablet (300 mg total) by mouth daily. What changed: when to take this   diclofenac Sodium 1 % Gel Commonly known as: VOLTAREN Apply 4 g topically 4 (four) times daily.   fluticasone furoate-vilanterol 200-25 MCG/ACT Aepb Commonly known as: BREO ELLIPTA May finish hospital dispensed container, then resume home Advair as prescribed.   fluticasone-salmeterol 250-50 MCG/ACT Aepb Commonly known as: Advair Diskus Inhale 1 puff into the lungs in the morning and at bedtime.   gabapentin 100  MG capsule Commonly known as: NEURONTIN Take 2 capsules (200 mg total) by mouth 3 (three) times daily.   Gvoke HypoPen 2-Pack 1 MG/0.2ML Soaj Generic drug: Glucagon Inject 1 each into the skin as needed. If blood sugar less than 60   hydrOXYzine 25 MG tablet Commonly known as: ATARAX Take 1 tablet (25 mg total) by mouth every 8 (eight) hours as needed for anxiety. What changed: when to take this   insulin aspart 100 UNIT/ML FlexPen Commonly known as: NOVOLOG Inject 5 Units into the skin 3 (three) times daily with meals.   insulin detemir 100 UNIT/ML injection Commonly known as: LEVEMIR Inject 0.15 mLs (15 Units total) into the skin 2 (two) times daily.   Iron (Ferrous Sulfate) 325 (65 Fe) MG Tabs Take 325 mg by mouth daily.   Linzess 145 MCG Caps capsule Generic drug: linaclotide Take 145 mcg by mouth daily before breakfast.   methimazole 5 MG tablet Commonly known as: TAPAZOLE Take 1  tablet (5 mg total) by mouth daily.   methocarbamol 500 MG tablet Commonly known as: ROBAXIN Take 1 tablet (500 mg total) by mouth every 6 (six) hours as needed for muscle spasms.   multivitamin with minerals tablet Take 1 tablet by mouth daily. Alive   omeprazole 40 MG capsule Commonly known as: PRILOSEC TAKE 1 CAPSULE(40 MG) BY MOUTH DAILY BEFORE BREAKFAST What changed:  how much to take how to take this when to take this additional instructions   Ozempic (1 MG/DOSE) 4 MG/3ML Sopn Generic drug: Semaglutide (1 MG/DOSE) Inject 1 mg into the skin once a week.   polyethylene glycol 17 g packet Commonly known as: MIRALAX / GLYCOLAX Take 17 g by mouth 2 (two) times daily.   promethazine 25 MG tablet Commonly known as: PHENERGAN Take 1 tablet (25 mg total) by mouth every 8 (eight) hours as needed for nausea or vomiting. What changed: when to take this   senna-docusate 8.6-50 MG tablet Commonly known as: Senokot-S Take 1 tablet by mouth 2 (two) times daily.   traZODone 100 MG tablet Commonly known as: DESYREL Take 1.5 tablets (150 mg total) by mouth at bedtime as needed for sleep. What changed:  medication strength when to take this reasons to take this   VITAMIN D PO Take 1 capsule by mouth daily.        Follow-up Information     Camillia Herter, NP Follow up.   Specialty: Nurse Practitioner Why: Call the office in 1-2 days to make arrangements for follow-up appointment. Contact information: [place] [address] [phone]         Courtney Heys, MD Follow up.   Specialty: Physical Medicine and Rehabilitation Why: office will call you to arrange your appt (sent) Contact information: [address] [address] [phone]         Earnie Larsson, MD Follow up.   Specialty: Neurosurgery Why: Call the office in 1-2 days to make arrangements for follow-up appointment. Contact information: [address] [place] [address] [phone]         Shamleffer, Melanie Crazier, MD. Schedule an appointment as soon as possible for a visit.   Specialties: Endocrinology, Radiology Why: Reschedule appointment. Follow-up thyroid issues ?  resume methimazole ? Contact information: [address] [place] [address] [phone]                 Signed: Barbie Banner  09/02/2022, 8:45 AM

## 2022-08-25 NOTE — Progress Notes (Signed)
Physical Therapy Session Note  Patient Details  Name: Kara Kelley MRN: [id number] Date of Birth: 02-Jan-1956  Today's Date: 08/25/2022 PT Individual Time: 1347-1430 PT Individual Time Calculation (min): 43 min   Short Term Goals: Week 1:  PT Short Term Goal 1 (Week 1): STG=LTG due ELOS  Skilled Therapeutic Interventions/Progress Updates:  Ambulation/gait training;Discharge planning;Functional mobility training;Therapeutic Activities;Balance/vestibular training;Disease management/prevention;Therapeutic Exercise;Wheelchair propulsion/positioning;DME/adaptive equipment instruction;Pain management;UE/LE Strength taining/ROM;Community reintegration;Patient/family education;Stair training;UE/LE Coordination activities   Therapy Documentation Precautions:  Precautions Precautions: Fall, Back Precaution Booklet Issued: Yes (comment) Precaution Comments: reviewed back precautions, proper brace wear, and proper posture Required Braces or Orthoses: Spinal Brace Spinal Brace: Lumbar corset, Applied in sitting position Restrictions Weight Bearing Restrictions: No  Pt received seated in w/c at bedside and agreeable to PT session. Pt without verbal complaints of pain during session. Pt transported by w/c to dayroom for time management and energy conservation. Pt ambulated ~5 ft with RW CGA to Nustep and performed for 10 minutes at level 6. Pt ambulated ~15 feet to mat table CGA and particpated in blocked practice of sit to stand transfers. Pt required 26 seconds to complete five time sit to stand which is >13 seconds indicating pt is at increased risk for falls.  Pt performed additional 5 sit to stand transfers with no AD. Pt attempted sit to stand without UE and unable to perform, plan to continue to build LE strength to better transfers prior to discharge. Pt returned to room and left seated in w/c at bedside with all needs in reach and safety alarm on.     Therapy/Group: Individual  Therapy  Truitt Leep Truitt Leep PT, DPT  08/25/2022, 2:03 PM

## 2022-08-25 NOTE — Plan of Care (Signed)
  Problem: RH Balance Goal: LTG Patient will maintain dynamic standing with ADLs (OT) Description: LTG:  Patient will maintain dynamic standing balance with assist during activities of daily living (OT)  Flowsheets (Taken 08/25/2022 1204) LTG: Pt will maintain dynamic standing balance during ADLs with: Independent with assistive device   Problem: Sit to Stand Goal: LTG:  Patient will perform sit to stand in prep for activites of daily living with assistance level (OT) Description: LTG:  Patient will perform sit to stand in prep for activites of daily living with assistance level (OT) Flowsheets (Taken 08/25/2022 1204) LTG: PT will perform sit to stand in prep for activites of daily living with assistance level: Independent with assistive device   Problem: RH Grooming Goal: LTG Patient will perform grooming w/assist,cues/equip (OT) Description: LTG: Patient will perform grooming with assist, with/without cues using equipment (OT) Flowsheets (Taken 08/25/2022 1204) LTG: Pt will perform grooming with assistance level of: Independent with assistive device    Problem: RH Bathing Goal: LTG Patient will bathe all body parts with assist levels (OT) Description: LTG: Patient will bathe all body parts with assist levels (OT) Flowsheets (Taken 08/25/2022 1204) LTG: Pt will perform bathing with assistance level/cueing: Independent with assistive device    Problem: RH Dressing Goal: LTG Patient will perform upper body dressing (OT) Description: LTG Patient will perform upper body dressing with assist, with/without cues (OT). Flowsheets (Taken 08/25/2022 1204) LTG: Pt will perform upper body dressing with assistance level of: Independent with assistive device Goal: LTG Patient will perform lower body dressing w/assist (OT) Description: LTG: Patient will perform lower body dressing with assist, with/without cues in positioning using equipment (OT) Flowsheets (Taken 08/25/2022 1204) LTG: Pt will  perform lower body dressing with assistance level of: Independent with assistive device   Problem: RH Simple Meal Prep Goal: LTG Patient will perform simple meal prep w/assist (OT) Description: LTG: Patient will perform simple meal prep with assistance, with/without cues (OT). Flowsheets (Taken 08/25/2022 1204) LTG: Pt will perform simple meal prep with assistance level of: Independent with assistive device   Problem: RH Light Housekeeping Goal: LTG Patient will perform light housekeeping w/assist (OT) Description: LTG: Patient will perform light housekeeping with assistance, with/without cues (OT). Flowsheets (Taken 08/25/2022 1204) LTG: Pt will perform light housekeeping with assistance level of: Independent with assistive device   Problem: RH Toilet Transfers Goal: LTG Patient will perform toilet transfers w/assist (OT) Description: LTG: Patient will perform toilet transfers with assist, with/without cues using equipment (OT) Flowsheets (Taken 08/25/2022 1204) LTG: Pt will perform toilet transfers with assistance level of: Independent with assistive device   Problem: RH Tub/Shower Transfers Goal: LTG Patient will perform tub/shower transfers w/assist (OT) Description: LTG: Patient will perform tub/shower transfers with assist, with/without cues using equipment (OT) Flowsheets (Taken 08/25/2022 1204) LTG: Pt will perform tub/shower stall transfers with assistance level of: Independent with assistive device

## 2022-08-25 NOTE — Evaluation (Signed)
Occupational Therapy Assessment and Plan  Patient Details  Name: JIMMYE WISNIESKI MRN: [id number] Date of Birth: [birth year]  OT Diagnosis: abnormal posture, cognitive deficits, muscle weakness (generalized), and pain in joint Rehab Potential: Rehab Potential (ACUTE ONLY): Good ELOS: 7-10 days   Today's Date: 08/25/2022 OT Individual Time: 1594-5859 OT Individual Time Calculation (min): 73 min     Hospital Problem: Principal Problem:   Spinal stenosis, lumbar   Past Medical History:  Past Medical History:  Diagnosis Date   Anemia    Anxiety    Arthritis    Asthma    Bunion    Callus    Chronic pain    Cocaine abuse ([place])    COPD (chronic obstructive pulmonary disease) ([place])    Corns and callosities    Degenerative joint disease    Depression    Diabetes mellitus    Endometrial polyp    ETOH abuse    Gall stones    GERD (gastroesophageal reflux disease)    Headache    history of Migraines   Hepatitis C    Hep C   Hyperlipidemia    Hypertension    Insomnia    Spondylolisthesis of lumbar region    Substance abuse ([place])    alcoholism   Tuberculosis 1985   Wears dentures    Wears glasses    Past Surgical History:  Past Surgical History:  Procedure Laterality Date   ANTERIOR CERVICAL DECOMP/DISCECTOMY FUSION N/A 10/26/2021   Procedure: ACDF - C4-C5 - C5-C6 - C6-C7;  Surgeon: Earnie Larsson, MD;  Location: [facility];  Service: Neurosurgery;  Laterality: N/A;   BACK SURGERY  2018   CESAREAN SECTION     x3   CHOLECYSTECTOMY N/A 08/11/2016   Procedure: LAPAROSCOPIC CHOLECYSTECTOMY WITH   INTRAOPERATIVE CHOLANGIOGRAM;  Surgeon: Stark Klein, MD;  Location: [facility];  Service: General;  Laterality: N/A;   COLONOSCOPY     Cotton Osteotomy w/ Graft Left 06/18/2009   Excision of Benign Lesion Right 01/30/2013   Rt Plantar   FOOT SURGERY     HAMMER TOE REPAIR Right 02/12/2016   RIGHT #5   Hammertoe Repair Left 06/18/2009   Lt #5   HYSTEROSCOPY N/A 06/20/2017   Procedure:  DILATION AND CURETTAGE, HYSTEROSCOPY w/ Polypectomy;  Surgeon: Lavonia Drafts, MD;  Location: [facility];  Service: Gynecology;  Laterality: N/A;   LUMBAR FUSION  2018   METATARSAL OSTEOTOMY Left 06/18/2009   #5   MULTIPLE TOOTH EXTRACTIONS     Nail Matrixectomy Left 06/18/2009   LT #1   OSTEOTOMY Right 01/30/2013   Rt #5   Phalangectomy Left 06/18/2009   LT #1   Phalangectomy Right 01/30/2013   Rt #1   TUBAL LIGATION      Assessment & Plan Clinical Impression: Jolinda Pinkstaff is a 66 year old female who presented to [facility] emergency department on 11/10 after reporting several falls and bilateral lower extremity weakness.  She is status post L4-5 and L5-S1 decompression and fusion surgery in 2018 and in 2021, respectively.   She also had C4-C7 ACDF in January 2023 by Dr. Annette Stable.  Transferred to [facility]. Neurosurgery was consulted and MRI performed. She is followed by Dr. Trenton Gammon who noted severe bilateral lower extremity weakness which left her nonambulatory.  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  She had failed conservative management including therapy and IV steroids.  She she was unable to stand or ambulate.  She was also reporting difficulty  with bowel and bladder function.  She was taken to the operating room on 11/13 and underwent bilateral L3-4 decompressive laminectomies and foraminectomies, as well as posterior lumbar interbody fusion and arthrodesis. Treated for hyperkalemia.  Elevated serum creatinine consistent with prerenal acute kidney injury.  Her baseline creatinine is approximately 0.9-1.0.  She was started on IV fluids 100 cc/h and these will continue.  Good urine output.  She reports weakness in her legs L>R. She denies numbness or tingling.  She continues to pain in her back and legs. Her dressing is clean and dry per neurosurgery. The patient requires inpatient physical medicine and rehabilitation evaluations and  treatment secondary to dysfunction due to for spinal stenosis status post decompression.  Patient transferred to CIR on 08/24/2022 .    Patient currently requires mod with basic self-care skills and IADL secondary to muscle weakness, impaired timing and sequencing and unbalanced muscle activation, decreased memory, and decreased standing balance, decreased postural control, and decreased balance strategies.  Prior to hospitalization, patient could complete BADL's and mobility  with independent  level but had falls prior to hospitalization.   Patient will benefit from skilled intervention to decrease level of assist with basic self-care skills, increase independence with basic self-care skills, and increase level of independence with iADL prior to discharge home with care partner.  Anticipate patient will require intermittent supervision and follow up home health.  OT - End of Session Activity Tolerance: Tolerates 30+ min activity with multiple rests Endurance Deficit: Yes Endurance Deficit Description: decreased OT Assessment Rehab Potential (ACUTE ONLY): Good OT Barriers to Discharge: Decreased caregiver support OT Barriers to Discharge Comments: only intermittent family support, pain OT Patient demonstrates impairments in the following area(s): Balance;Endurance;Motor;Cognition;Pain;Skin Integrity OT Basic ADL's Functional Problem(s): Grooming;Bathing;Dressing;Toileting OT Advanced ADL's Functional Problem(s): Simple Meal Preparation;Laundry;Light Housekeeping OT Transfers Functional Problem(s): Toilet;Tub/Shower OT Additional Impairment(s): None OT Plan OT Intensity: Minimum of 1-2 x/day, 45 to 90 minutes OT Frequency: 5 out of 7 days OT Duration/Estimated Length of Stay: 7-10 days OT Treatment/Interventions: Balance/vestibular training;Cognitive remediation/compensation;Community reintegration;Discharge planning;Disease mangement/prevention;DME/adaptive equipment instruction;Functional  mobility training;Neuromuscular re-education;Pain management;Patient/family education;Self Care/advanced ADL retraining;Psychosocial support;Skin care/wound managment;Therapeutic Activities;Therapeutic Exercise;UE/LE Strength taining/ROM;Wheelchair propulsion/positioning OT Self Feeding Anticipated Outcome(s): indep OT Basic Self-Care Anticipated Outcome(s): mod I OT Toileting Anticipated Outcome(s): mod I OT Bathroom Transfers Anticipated Outcome(s): mod I OT Recommendation Patient destination: Home Follow Up Recommendations: Home health OT Equipment Recommended: 3 in 1 bedside comode;Rolling walker with 5" wheels;Tub/shower bench Equipment Details: 3 in 1 commode, TTB, RW   OT Evaluation Precautions/Restrictions  Precautions Precautions: Fall;Back Precaution Booklet Issued: Yes (comment) Precaution Comments: reviewed back precautions, proper brace wear, and proper posture Required Braces or Orthoses: Spinal Brace Spinal Brace: Lumbar corset;Applied in sitting position Restrictions Weight Bearing Restrictions: No General Chart Reviewed: Yes Family/Caregiver Present: No   Pain Pain Assessment Pain Scale: 0-10 Pain Score: 6  Pain Type: Surgical pain Pain Location: Hip Pain Orientation: Right Pain Descriptors / Indicators: Aching;Sharp Pain Onset: On-going Patients Stated Pain Goal: 2 Pain Intervention(s): Pain med given for lower pain score than stated, per patient request;Repositioned;Elevated extremity Home Living/Prior [place] expects to be discharged to:: Private residence Living Arrangements: Alone Available Help at Discharge: Family, Available 24 hours/day, Other (Comment) Type of Home: Apartment Home Access: Ramped entrance Home Layout: One level Bathroom Shower/Tub: Optometrist: Yes  Lives With: Alone IADL History Homemaking Responsibilities: Yes Current License:  Yes Occupation: Unemployed Leisure and Hobbies: Engineer, technical sales music and church Prior  Function Level of Independence: Independent with basic ADLs, Independent with transfers, Independent with gait  Able to Take Stairs?: Yes Driving: Yes Vocation: On disability Vision Baseline Vision/History: 1 Wears glasses Ability to See in Adequate Light: 0 Adequate Patient Visual Report: No change from baseline Vision Assessment?: No apparent visual deficits Perception  Perception: Within Functional Limits Praxis Praxis: Intact Cognition Cognition Overall Cognitive Status: Within Functional Limits for tasks assessed Arousal/Alertness: Awake/alert Orientation Level: Person;Place;Situation Person: Oriented Place: Oriented Situation: Oriented Memory: Impaired Memory Impairment: Decreased recall of new information Attention: Sustained Awareness: Impaired Problem Solving: Appears intact Safety/Judgment: Appears intact Brief Interview for Mental Status (BIMS) Repetition of Three Words (First Attempt): 3 Temporal Orientation: Year: Correct Temporal Orientation: Month: Accurate within 5 days Temporal Orientation: Day: Correct Recall: "Sock": Yes, after cueing ("something to wear") Recall: "Blue": Yes, no cue required Recall: "Bed": Yes, no cue required BIMS Summary Score: 14 Sensation Sensation Light Touch: Impaired by gross assessment Hot/Cold: Appears Intact Proprioception: Appears Intact Stereognosis: Appears Intact Additional Comments: grossly diminshed sensation to light touch on left LE, intact B UE's Coordination Gross Motor Movements are Fluid and Coordinated: No Fine Motor Movements are Fluid and Coordinated: No Coordination and Movement Description: grossly uncoordinated due to strength and activity tolerance deficits Finger Nose Finger Test: L UE dysmetria Heel Shin Test: bilateral dysmetria 9 Hole Peg Test: TBA Motor  Motor Motor: Other (comment) Motor - Skilled Clinical  Observations: generalized strength and balance/coordiantion deficits  Trunk/Postural Assessment  Cervical Assessment Cervical Assessment: Within Functional Limits Thoracic Assessment Thoracic Assessment: Within Functional Limits Lumbar Assessment Lumbar Assessment: Exceptions to [facility] (posterior pelvic tilt) Postural Control Postural Control: Deficits on evaluation Righting Reactions: delayed  Balance Balance Balance Assessed: Yes Static Sitting Balance Static Sitting - Balance Support: Feet supported Static Sitting - Level of Assistance: 5: Stand by assistance Dynamic Sitting Balance Dynamic Sitting - Balance Support: Feet supported;During functional activity Dynamic Sitting - Level of Assistance: 5: Stand by assistance Static Standing Balance Static Standing - Balance Support: Bilateral upper extremity supported Static Standing - Level of Assistance: 4: Min assist Dynamic Standing Balance Dynamic Standing - Balance Support: Bilateral upper extremity supported;During functional activity Dynamic Standing - Level of Assistance: 4: Min assist Extremity/Trunk Assessment RUE Assessment RUE Assessment: Within Functional Limits LUE Assessment LUE Assessment: Within Functional Limits  Care Tool Care Tool Self Care Eating   Eating Assist Level: Set up assist    Oral Care    Oral Care Assist Level: Set up assist    Bathing   Body parts bathed by patient: Right arm;Left arm;Chest;Abdomen;Front perineal area;Right upper leg;Left upper leg;Face Body parts bathed by helper: Buttocks;Right lower leg;Left lower leg   Assist Level: Moderate Assistance - Patient 50 - 74%    Upper Body Dressing(including orthotics)   What is the patient wearing?: Orthosis;Pull over shirt   Assist Level: Moderate Assistance - Patient 50 - 74%    Lower Body Dressing (excluding footwear)   What is the patient wearing?: Pants Assist for lower body dressing: Moderate Assistance - Patient 50 - 74%     Putting on/Taking off footwear   What is the patient wearing?: Non-skid slipper socks Assist for footwear: Maximal Assistance - Patient 25 - 49%       Care Tool Toileting Toileting activity   Assist for toileting: Contact Guard/Touching assist     Care Tool Bed Mobility Roll left and right activity   Roll left and right assist level: Minimal Assistance - Patient > 75%  Sit to lying activity   Sit to lying assist level: Minimal Assistance - Patient > 75%    Lying to sitting on side of bed activity   Lying to sitting on side of bed assist level: the ability to move from lying on the back to sitting on the side of the bed with no back support.: Contact Guard/Touching assist     Care Tool Transfers Sit to stand transfer   Sit to stand assist level: Contact Guard/Touching assist    Chair/bed transfer   Chair/bed transfer assist level: Minimal Assistance - Patient > 75%     Toilet transfer   Assist Level: Minimal Assistance - Patient > 75%     Care Tool Cognition  Expression of Ideas and Wants Expression of Ideas and Wants: 4. Without difficulty (complex and basic) - expresses complex messages without difficulty and with speech that is clear and easy to understand  Understanding Verbal and Non-Verbal Content Understanding Verbal and Non-Verbal Content: 3. Usually understands - understands most conversations, but misses some part/intent of message. Requires cues at times to understand   Memory/Recall Ability Memory/Recall Ability : Current season;That he or she is in a hospital/hospital unit   Refer to Care Plan for [place] 1 OT Short Term Goal 1 (Week 1): STG=LTG's 2/2 LOS  Recommendations for other services: None    Skilled Therapeutic Intervention ADL ADL Eating: Set up Where Assessed-Eating: Bed level Grooming: Setup Where Assessed-Grooming: Wheelchair;Sitting at sink Upper Body Bathing: Minimal assistance Where Assessed-Upper Body  Bathing: Sitting at sink;Wheelchair Lower Body Bathing: Moderate assistance Where Assessed-Lower Body Bathing: Wheelchair;Sitting at sink;Standing at sink Upper Body Dressing: Setup Where Assessed-Upper Body Dressing: Wheelchair;Sitting at sink Lower Body Dressing: Moderate assistance Where Assessed-Lower Body Dressing: Sitting at sink;Standing at sink;Wheelchair Toileting: Minimal assistance Where Assessed-Toileting: Proofreader: Radiographer, therapeutic: Minimal Museum/gallery conservator Method: Stand pivot Tub/Shower Equipment: Facilities manager: Environmental education officer Method: Radiographer, therapeutic: Radio broadcast assistant ADL Comments: Pt with R hip pain limiting tolerance for AM self care routine, pt able to transfer to and from 3 in 1 with min A then back out of bed to w/c with min a. Requires mod A for LE management with bed mobility, sink side grooming wiht set up, UB bathing and dresisng with min a, TLSO mod-max A and LB bathing and dressing. Mobility  Bed Mobility Bed Mobility: Rolling Right;Rolling Left;Supine to Sit;Sit to Supine Rolling Right: Contact Guard/Touching assist Rolling Left: Contact Guard/Touching assist Supine to Sit: Contact Guard/Touching assist Sit to Supine: Minimal Assistance - Patient > 75% Transfers Sit to Stand: Contact Guard/Touching assist Stand to Sit: Contact Guard/Touching assist  OT treatment/Intervention:  Pt seen for full initial OT evaluation and training session this am.  OT introduced role of therapy and purpose of session.  Pt able to express all ideas and history with mild decreased STM and reported this since pain meds and improving. OT will continue to monitor need for ST.  Assessed UE, vision, sensation, cognition, bed level and throughout session as well as balance, and overall activity tolerance. See functional status for self care,  functional mobility and transfers using RW  and training with TLSO brace and joint protection. Care coord with nursing as pt connected to IV on bed to be switched to rolling pole for increased mobility.   OT recommending short rehab stay to maximize safety and independence with  rec for HHOT, 3 in 1 commode, RW and transfer tub bench. Pt left at end of session with chair alarm set up in recliner with tray table with needs and nurse call bell within reach.   Discharge Criteria: Patient will be discharged from OT if patient refuses treatment 3 consecutive times without medical reason, if treatment goals not met, if there is a change in medical status, if patient makes no progress towards goals or if patient is discharged from hospital.  The above assessment, treatment plan, treatment alternatives and goals were discussed and mutually agreed upon: by patient  Barnabas Lister 08/25/2022, 12:44 PM

## 2022-08-26 DIAGNOSIS — Z981 Arthrodesis status: Secondary | ICD-10-CM | POA: Diagnosis not present

## 2022-08-26 LAB — GLUCOSE, CAPILLARY
Glucose-Capillary: 201 mg/dL — ABNORMAL HIGH (ref 70–99)
Glucose-Capillary: 140 mg/dL — ABNORMAL HIGH (ref 70–99)
Glucose-Capillary: 183 mg/dL — ABNORMAL HIGH (ref 70–99)
Glucose-Capillary: 86 mg/dL (ref 70–99)
Glucose-Capillary: 144 mg/dL — ABNORMAL HIGH (ref 70–99)
Glucose-Capillary: 153 mg/dL — ABNORMAL HIGH (ref 70–99)

## 2022-08-26 LAB — URINE CULTURE: Culture: <10000 — AB

## 2022-08-26 MED ORDER — CEFADROXIL 500 MG PO CAPS
500.0000 mg | ORAL_CAPSULE | Freq: Two times a day (BID) | ORAL | Status: AC
  Administered 2022-08-26 – 2022-08-30 (×10): 500 mg via ORAL
  Filled 2022-08-26 (×11): qty 1

## 2022-08-26 MED ORDER — CEPHALEXIN 250 MG PO CAPS: 500.0000 mg | ORAL_CAPSULE | Freq: Three times a day (TID) | ORAL | Status: DC

## 2022-08-26 MED ORDER — FLUTICASONE FUROATE-VILANTEROL 200-25 MCG/ACT IN AEPB
1.0000 | INHALATION_SPRAY | Freq: Every day | RESPIRATORY_TRACT | Status: DC
  Administered 2022-08-26 – 2022-09-01 (×7): 1 via RESPIRATORY_TRACT
  Filled 2022-08-26: qty 28

## 2022-08-26 NOTE — Progress Notes (Signed)
Occupational Therapy Session Note  Patient Details  Name: Kara Kelley MRN: [id number] Date of Birth: 09-16-1956  Today's Date: 08/26/2022 OT Individual Time: 1100-1200 OT Individual Time Calculation (min): 60 min    Short Term Goals: Week 1:  OT Short Term Goal 1 (Week 1): STG=LTG's 2/2 LOS  Skilled Therapeutic Interventions/Progress Updates:   Pt in bed resting upon OT arrival fatigued from am OT and PT session, already performing am self care with earlier OT therefore shower plan deferred until tomorrow. Family arrived for visit and pt able to move from supine to sit with close S and use of bed rails. Pt agreeable to OT session with focus on activity tolerance and general strength training. Pt educated on risks of prolonged bed rest and open to recliner use vs bed. Family and pt visited while OT gathered supplies. Positioning with pillows for skin and joint protection as well as pain management. Pt tolerated yellow (light) tband 2 sets of 10 reps for scap, sh and elbow with brief rests between sets. Encouraged breathing with counting out loud during therex. Issued and trained in foam resistance cube medium level for therex between visits for overall strength and cdp mngt. Pt reported no further "episodes" of feeling confused or "nodding" off and family agrees. Pain 4/10 primarily R LE with relief with repositioning and meds.  Pt left at EOB with nurse call button and needs in place with family supervision.   Therapy Documentation Precautions:  Precautions Precautions: Fall, Back Precaution Booklet Issued: Yes (comment) Precaution Comments: reviewed back precautions, proper brace wear, and proper posture Required Braces or Orthoses: Spinal Brace Spinal Brace: Lumbar corset, Applied in sitting position Restrictions Weight Bearing Restrictions: No    Therapy/Group: Individual Therapy  Vicenta Dunning 08/26/2022, 7:54 AM

## 2022-08-26 NOTE — IPOC Note (Signed)
Overall Plan of Care [facility]) Patient Details Name: Kara Kelley MRN: [id number] DOB: 1956/03/19  Admitting Diagnosis: Spinal stenosis, lumbar  Hospital Problems: Principal Problem:   Spinal stenosis, lumbar     Functional Problem List: Nursing Bladder, Pain, Bowel, Perception, Edema, Safety, Sensory, Endurance, Medication Management, Motor, Skin Integrity  PT Balance, Endurance, Motor, Pain, Sensory  OT Balance, Endurance, Motor, Cognition, Pain, Skin Integrity  SLP    TR         Basic ADL's: OT Grooming, Bathing, Dressing, Toileting     Advanced  ADL's: OT Simple Meal Preparation, Laundry, Light Housekeeping     Transfers: PT Bed Mobility, Bed to Chair, Customer service manager, Tub/Shower     Locomotion: PT Ambulation, Wheelchair Mobility     Additional Impairments: OT None  SLP        TR      Anticipated Outcomes Item Anticipated Outcome  Self Feeding indep  Swallowing      Basic self-care  mod I  Toileting  mod I   Bathroom Transfers mod I  Bowel/Bladder  continent x 2  Transfers  Mod I  Locomotion  Mod I  Communication     Cognition     Pain  less than 4  Safety/Judgment  remain fall free while in rehab   Therapy Plan: PT Intensity: Minimum of 1-2 x/day ,45 to 90 minutes PT Frequency: 5 out of 7 days PT Duration Estimated Length of Stay: 7-10 days OT Intensity: Minimum of 1-2 x/day, 45 to 90 minutes OT Frequency: 5 out of 7 days OT Duration/Estimated Length of Stay: 7-10 days     Team Interventions: Nursing Interventions Patient/Family Education, Disease Management/Prevention, Skin Care/Wound Management, Discharge Planning, Bladder Management, Pain Management, Bowel Management, Medication Management  PT interventions Ambulation/gait training, Discharge planning, Functional mobility training, Therapeutic Activities, Balance/vestibular training, Disease management/prevention, Therapeutic Exercise, Wheelchair propulsion/positioning, DME/adaptive  equipment instruction, Pain management, UE/LE Strength taining/ROM, Community reintegration, Equities trader education, Museum/gallery curator, UE/LE Coordination activities  OT Interventions Warden/ranger, Cognitive remediation/compensation, Firefighter, Discharge planning, Disease mangement/prevention, Fish farm manager, Functional mobility training, Neuromuscular re-education, Pain management, Patient/family education, Self Care/advanced ADL retraining, Psychosocial support, Skin care/wound managment, Therapeutic Activities, Therapeutic Exercise, UE/LE Strength taining/ROM, Wheelchair propulsion/positioning  SLP Interventions    TR Interventions    SW/CM Interventions Discharge Planning, Psychosocial Support, Patient/Family Education   Barriers to Discharge MD  Medical stability, Home enviroment access/loayout, Wound care, Lack of/limited family support, Weight, and Weight bearing restrictions  Nursing Wound Care, Lack of/limited family support, Weight, Weight bearing restrictions, Medication compliance home alone 1 level apartment with ramp entrance  PT Other (comments) alone, apartment with ramped entrance with 24/7 care from daughters  OT Decreased caregiver support only intermittent family support, pain  SLP      SW Insurance for SNF coverage     Team Discharge Planning: Destination: PT-Home ,OT- Home , SLP-  Projected Follow-up: PT-Home health PT, OT-  Home health OT, SLP-  Projected Equipment Needs: PT-To be determined, OT- 3 in 1 bedside comode, Rolling walker with 5" wheels, Tub/shower bench, SLP-  Equipment Details: PT-owns rollator and single point cane, OT-3 in 1 commode, TTB, RW Patient/family involved in discharge planning: PT- Patient,  OT-Patient, SLP-   MD ELOS: 7-10 days Medical Rehab Prognosis:  Good Assessment: The patient has been admitted for CIR therapies with the diagnosis of s/p lumbar fusion due to spinal stenosis. The team  will be addressing functional mobility, strength, stamina, balance, safety, adaptive techniques and  equipment, self-care, bowel and bladder mgt, patient and caregiver education, wound care. Goals have been set at mod I. Anticipated discharge destination is home.        See Team Conference Notes for weekly updates to the plan of care

## 2022-08-26 NOTE — Progress Notes (Signed)
PROGRESS NOTE   Subjective/Complaints:  Pt reports when doesn't take pain meds, hurts more.  Had 2 large Bms yesterday after intervention.  Surgery was 4 days ago, so will keep honeycomb dressing.   Has UTI- will treat.  ROS:  Pt denies SOB, abd pain, CP, N/V/C/D, and vision changes  Except per HPI  Objective:   No results found. Recent Labs    08/25/22 0530  WBC 12.4*  HGB 8.2*  HCT 23.9*  PLT 145*   Recent Labs    08/25/22 0530  NA 136  K 3.8  CL 108  CO2 20*  GLUCOSE 109*  BUN 11  CREATININE 0.96  CALCIUM 8.5*    Intake/Output Summary (Last 24 hours) at 08/26/2022 0848 Last data filed at 08/25/2022 2349 Gross per 24 hour  Intake 1924.99 ml  Output --  Net 1924.99 ml        Physical Exam: Vital Signs Blood pressure 126/75, pulse 78, temperature 98.4 F (36.9 C), resp. rate 17, height 4\' 11"  (1.499 m), weight 79.5 kg, SpO2 99 %.      General: awake, alert, appropriate, sitting up in bed;  NAD HENT: conjugate gaze; oropharynx moist CV: regular rate; no JVD Pulmonary: CTA B/L; no W/R/R- good air movement GI: soft, NT, ND, (+)BS Psychiatric: appropriate- delayed responses; flat Neurological: still delayed- but problem solving a little better this AM Skin: Clean and intact without signs of breakdown, honeycomb dressing has a little spot 1/2 dime size that has dreid blood, but otherwise looks good Neuro:  Alert and oriented x4, follows simple commands, CN 2-12 intact, speech fluent Sensation intact to LT in all 4 extremities Strength 4/5 in b/l UE Strength 1-2 proximal RLE and 3/5 ankle PF and 1-2/5 DF  Strength 2-3 proximal LLE and 3/5 ankle PF and 2/5 DF  FTN intact b/l Musculoskeletal: No abnormal tone noted, no joint swelling noted  Assessment/Plan: 1. Functional deficits which require 3+ hours per day of interdisciplinary therapy in a comprehensive inpatient rehab  setting. Physiatrist is providing close team supervision and 24 hour management of active medical problems listed below. Physiatrist and rehab team continue to assess barriers to discharge/monitor patient progress toward functional and medical goals  Care Tool:  Bathing    Body parts bathed by patient: Right arm, Left arm, Chest, Abdomen, Front perineal area, Right upper leg, Left upper leg, Face   Body parts bathed by helper: Buttocks, Right lower leg, Left lower leg     Bathing assist Assist Level: Moderate Assistance - Patient 50 - 74%     Upper Body Dressing/Undressing Upper body dressing   What is the patient wearing?: Orthosis, Pull over shirt    Upper body assist Assist Level: Moderate Assistance - Patient 50 - 74%    Lower Body Dressing/Undressing Lower body dressing      What is the patient wearing?: Pants     Lower body assist Assist for lower body dressing: Moderate Assistance - Patient 50 - 74%     Toileting Toileting    Toileting assist Assist for toileting: Contact Guard/Touching assist     Transfers Chair/bed transfer  Transfers assist     Chair/bed transfer  assist level: Minimal Assistance - Patient > 75%     Locomotion Ambulation   Ambulation assist      Assist level: Minimal Assistance - Patient > 75% Assistive device: Other (comment) (handrail) Max distance: 20 ft   Walk 10 feet activity   Assist     Assist level: Minimal Assistance - Patient > 75% Assistive device: Other (comment) (handrail)   Walk 50 feet activity   Assist Walk 50 feet with 2 turns activity did not occur: Safety/medical concerns (unable to perform due to strength and activity tolerance deficits)         Walk 150 feet activity   Assist Walk 150 feet activity did not occur: Safety/medical concerns (unable to perform due to strength and activity tolerance deficits)         Walk 10 feet on uneven surface  activity   Assist     Assist level:  Minimal Assistance - Patient > 75% Assistive device: Other (comment) (handrail)   Wheelchair     Assist Is the patient using a wheelchair?: Yes Type of Wheelchair: Manual    Wheelchair assist level: Minimal Assistance - Patient > 75% Max wheelchair distance: 150 ft    Wheelchair 50 feet with 2 turns activity    Assist        Assist Level: Minimal Assistance - Patient > 75%   Wheelchair 150 feet activity     Assist      Assist Level: Minimal Assistance - Patient > 75%   Blood pressure 126/75, pulse 78, temperature 98.4 F (36.9 C), resp. rate 17, height 4\' 11"  (1.499 m), weight 79.5 kg, SpO2 99 %.  Medical Problem List and Plan: 1. 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  Antithrombotics: -DVT/anticoagulation:  Mechanical: Sequential compression devices, below knee Bilateral lower extremitiesTED hose             -antiplatelet therapy: none 3. Pain Management: Tylenol, Norco, Valium             - Polysubstance abuse history,  11/17- Pain elevated when doesn't take meds 4. Mood/Behavior/Sleep: LCSW to evaluate and provide emotional support             -depression: continue Abilify, Wellbutrin, trazodone             -anxiety: continue Atarax prn             -antipsychotic agents: n/a 5. Neuropsych/cognition: This patient is capable of making decisions on her own behalf. 6. Skin/Wound Care: routine skin care checks; monitor surgical incision- has honeycomb dressing in place  11/17- Surgery was 11/13- so will wait to remove honeycomb dressing- a little sanguinous drainage in 1 small spot, but otherwise looks good.  7. Fluids/Electrolytes/Nutrition: Routine Is and Os and follow-up chemistries 8: DM: CBGs q AC and q HS  (home>>Levemir 32 units q HS, metformin 500 mg daily, Novolog 18 units TID with meals). Hypoglycemia was worsened by recent steroid treatment but appears to be improving.              -continue SSI q AC and q HS             -  continue Levemir 15 units BID  11/16- BG's controlled- con't regimen  11/17- CBG's 140-168 except 1 spike to 201- will monitor for trend 9: Obesity: BMI = 34.34 10: Hypertension: monitor TID and prn             -continue lisinopril 10 mg daily             -spironolactone on hold 11: Hyperlipidemia: continue Lipitor 10mg  daily 12: Asthma/COPD: continue albuterol inhaler prn 13: Hyperthyroidism: continue methimazole 14: Gastroesophageal reflux disease: continue Protonix 15: Insomnia: continue Ambien 5 mg q HS prn (on trazodone 150 mg q HS) 16: Anemia: hemoglobin stable at 10.2; follow-up CBC             -continue iron supplements 17: AKI, metabolic acidosis: pre-renal. Continue IVFs and follow-up BMP, last Cr 1.32  11/16- Cr back to baseline- con't to monitor 18. Hyperkalemia, improved with lokelma, spironolactone on hold             -Recheck levels tomorrow  11/16- K+ 3.8- doing better 19. Confusion-with leukocytosis- mild to moderate  11/16- will check U/A and Cx esp since WBC is 12.6-   11/17- U/A shows Moderate leukocytes, no bacteria, but 21-50 WBCs- due ot Sx's. Will start Keflex 500 mg q8 hours x 5 days since not a complicated UTI.  20. Constipation  11/16- LBM 1 week ago- said hasn't eatne much, but constipated- will do Sorbitol and SSE after therapy   11/17-had 2 med-large BM's yesterday- feels better   I spent a total of 35   minutes on total care today- >50% coordination of care- due to treating UTI after speaking with PA as well as IPOC   LOS: 2 days A FACE TO FACE EVALUATION WAS PERFORMED  Keontae Levingston 08/26/2022, 8:48 AM

## 2022-08-26 NOTE — Progress Notes (Signed)
Patient ID: Kara Kelley, female   DOB: [birth year], 66 y.o.   MRN: [id number]  Discussed equipment needs with pt and she was agreeable to order rolling walker, 3 in 1 and tube bench via [employer].

## 2022-08-26 NOTE — Progress Notes (Signed)
Occupational Therapy Session Note  Patient Details  Name: Kara Kelley MRN: [id number] Date of Birth: Jul 20, 1956  Today's Date: 08/26/2022 OT Individual Time: 9191-6606 OT Individual Time Calculation (min): 75 min    Short Term Goals: Week 1:  OT Short Term Goal 1 (Week 1): STG=LTG's 2/2 LOS  Skilled Therapeutic Interventions/Progress Updates:    Upon OT arrival, pt semi recumbent in bed reporting 7/10 pain in back and is agreeable to OT treatment. Treatment intervention with a focus self care retraining and strengthening. Pt completes full sponge bath ADL at the levels below. Educated on AE and pt completes dressing with AE. Pt was transported down to therapy gym via w/c and total A and completes light strengtheing with 2lb dumbbell seated in w/c for 1x10 reps. Pt completes shoulder felx/ext, elbow flex/ext, supination/pronation, shoulder abd/add, and chest press. Min verbal cues required for form. Pt was transported back to her room via w/c and total A and left in w/c at end of session with all needs met and sister at bedside.   Therapy Documentation Precautions:  Precautions Precautions: Fall, Back Precaution Booklet Issued: Yes (comment) Precaution Comments: reviewed back precautions, proper brace wear, and proper posture Required Braces or Orthoses: Spinal Brace Spinal Brace: Lumbar corset, Applied in sitting position Restrictions Weight Bearing Restrictions: No  ADL: ADL Eating: Not assessed Where Assessed-Eating: Bed level Grooming: Supervision/safety Where Assessed-Grooming: Wheelchair, Sitting at sink Upper Body Bathing: Supervision/safety Where Assessed-Upper Body Bathing: Sitting at sink, Wheelchair Lower Body Bathing: Minimal assistance (feet) Where Assessed-Lower Body Bathing: Wheelchair, Sitting at sink, Standing at sink Upper Body Dressing: Setup, Supervision/safety Where Assessed-Upper Body Dressing: Wheelchair, Sitting at sink Lower Body Dressing: Minimal  assistance (to donn L sock using sock aid) Where Assessed-Lower Body Dressing: Sitting at sink, Standing at sink, Wheelchair Toileting: Contact guard Where Assessed-Toileting: Glass blower/designer: Therapist, music Method: Arts development officer: Energy manager: Unable to assess Tub/Shower Transfer Method: Stand pivot Tub/Shower Equipment: Facilities manager: Environmental education officer Method: Unable to assess [employer]: Transfer tub bench ADL Comments: Pt completes sponge bath ADL    Therapy/Group: Individual Therapy  Artie Mcintyre 08/26/2022, 8:21 AM

## 2022-08-26 NOTE — Progress Notes (Signed)
Physical Therapy Session Note  Patient Details  Name: Kara Kelley MRN: [id number] Date of Birth: 1956-01-11  Today's Date: 08/26/2022 PT Individual Time: 0930-1030 PT Individual Time Calculation (min): 60 min   Short Term Goals: Week 1:  PT Short Term Goal 1 (Week 1): STG=LTG due ELOS  Skilled Therapeutic Interventions/Progress Updates:    Pt seated in w/c on arrival and agreeable to therapy. Pt reports 10/10 pain in back, RN provided medication. Pt also reports discomfort in her RLE, independent of position. Denies numbness, burning, tingling, but "it feels funny." Pt also reports burning overnight where her skin touches the mattress. Consulted RN and Microbiologist. Pt transported to therapy gym for time management and energy conservation. Pt performed Sit to stand to RW with min A fading to CGA for cues for hand placement and power up at beginning of session. Pt performed 3 x 8 squats for BLE strength and warm up prior to gait. Extended rest breaks d/t fatigue and pain. Pt ambulated x 26 ft and 20 ft. During second bout, pt asked to use bathroom. Pt ambulated into bathroom for continent urine void, CGA for 3/3 toileting tasks. Hand hygiene in standing with CGA. Pt returned to room and performed min A ambulatory transfer to bed. Min A sit>supine for BLE management. Pt remained in bed at end of session and was left with all needs in reach and alarm active.   Therapy Documentation Precautions:  Precautions Precautions: Fall, Back Precaution Booklet Issued: Yes (comment) Precaution Comments: reviewed back precautions, proper brace wear, and proper posture Required Braces or Orthoses: Spinal Brace Spinal Brace: Lumbar corset, Applied in sitting position Restrictions Weight Bearing Restrictions: No General:       Therapy/Group: Individual Therapy  Juluis Rainier 08/26/2022, 9:45 AM

## 2022-08-27 DIAGNOSIS — N179 Acute kidney failure, unspecified: Secondary | ICD-10-CM | POA: Diagnosis not present

## 2022-08-27 DIAGNOSIS — E1169 Type 2 diabetes mellitus with other specified complication: Secondary | ICD-10-CM

## 2022-08-27 DIAGNOSIS — E875 Hyperkalemia: Secondary | ICD-10-CM

## 2022-08-27 DIAGNOSIS — I10 Essential (primary) hypertension: Secondary | ICD-10-CM | POA: Diagnosis not present

## 2022-08-27 LAB — GLUCOSE, CAPILLARY
Glucose-Capillary: 179 mg/dL — ABNORMAL HIGH (ref 70–99)
Glucose-Capillary: 129 mg/dL — ABNORMAL HIGH (ref 70–99)
Glucose-Capillary: 126 mg/dL — ABNORMAL HIGH (ref 70–99)
Glucose-Capillary: 164 mg/dL — ABNORMAL HIGH (ref 70–99)

## 2022-08-27 NOTE — Progress Notes (Signed)
Physical Therapy Session Note  Patient Details  Name: Kara Kelley MRN: [id number] Date of Birth: May 12, 1956  Today's Date: 08/27/2022 PT Individual Time: 1415-1525 PT Individual Time Calculation (min): 70 min   Short Term Goals: Week 1:  PT Short Term Goal 1 (Week 1): STG=LTG due ELOS  Skilled Therapeutic Interventions/Progress Updates:  Pt was seen bedside in the pm. Pt transferred supine to edge of bed with side rail and min A with verbal cues. Pt maintained sitting balance with S. Pt performed all sit to stand and stand pivot transfers with rolling walker and c/g. Pt ambulated 75 feet x 2 with rolling walker and c/g. Pt performed hip flex and LAQs, 3 sets x 10 reps each seated in w/c for LE strengthening. Pt performed cone taps and alternating cone taps 3 sets x 10 reps each. Pt returned to room following treatment. Pt ambulated 15 feet to recliner with rolling walker and c/g. Pt left sitting up in recliner with chair alarm and all needs within reach.   Therapy Documentation Precautions:  Precautions Precautions: Fall, Back Precaution Booklet Issued: Yes (comment) Precaution Comments: reviewed back precautions, proper brace wear, and proper posture Required Braces or Orthoses: Spinal Brace Spinal Brace: Lumbar corset, Applied in sitting position Restrictions Weight Bearing Restrictions: No General:    Pain: Pt c/o 7/10 back pain.     Therapy/Group: Individual Therapy  Rayford Halsted 08/27/2022, 3:26 PM

## 2022-08-27 NOTE — Progress Notes (Signed)
Pt slept well last night. PO intake of water of plus NS @ 100 Continuously with the exception of 30- 40 minutes when Pt ambulated with assistant with RW plus RN without difficulty. Pt does complain of R knee Pain and request Voltaren gel for relief from MD.

## 2022-08-27 NOTE — Progress Notes (Signed)
Physical Therapy Session Note  Patient Details  Name: Kara Kelley MRN: [id number] Date of Birth: 1956/06/28  Today's Date: 08/27/2022 PT Individual Time: 4580-9983 PT Individual Time Calculation (min): 43 min   Short Term Goals: Week 1:  PT Short Term Goal 1 (Week 1): STG=LTG due ELOS  Skilled Therapeutic Interventions/Progress Updates:  Pt was seen bedside in the am. Pt sitting on edge of bed. Pt donned lumbar corset at edge of bed. Pt performed all sit to stand and stand pivot transfers with rolling walker and c/g. Pt transported to rehab gym. Pt performed step taps and alternating step taps, 3 sets x 10 reps each. Pt ambulated 100 feet x 2 with rolling walker and c/g with verbal cues. Pt returned to room following treatment. Pt transferred to edge of bed with rolling walker and c/g. Doffed lumbar corset at edge of bed. Pt transferred edge of bed to supine with c/g and verbal cues. Pt left sitting up in bed with all needs within reach and bed alarm.   Therapy Documentation Precautions:  Precautions Precautions: Fall, Back Precaution Booklet Issued: Yes (comment) Precaution Comments: reviewed back precautions, proper brace wear, and proper posture Required Braces or Orthoses: Spinal Brace Spinal Brace: Lumbar corset, Applied in sitting position Restrictions Weight Bearing Restrictions: No General:    Pain: Pain Assessment Pain Scale: 0-10 Pain Score: 7 Pain Type: Surgical pain Pain Location: Back Pain Orientation: Lower Pain Descriptors / Indicators: Aching;Constant Pain Frequency: Constant Pain Onset: On-going Patients Stated Pain Goal: 2 Pain Intervention(s): Medication (See eMAR)   Therapy/Group: Individual Therapy  Rayford Halsted 08/27/2022, 12:29 PM

## 2022-08-27 NOTE — Progress Notes (Addendum)
PROGRESS NOTE   Subjective/Complaints:  Reports that she has pain shooting down the from of her right leg. She had similar pain in the past thought to be sciatica. She also has some pain around lower abdomen.  She has had this for few days and says she thought it would go away.   ROS:  Pt denies SOB, HA,  cough, CP, N/V/C/D, and vision changes  Except per HPI  Objective:   No results found. Recent Labs    08/25/22 0530  WBC 12.4*  HGB 8.2*  HCT 23.9*  PLT 145*    Recent Labs    08/25/22 0530  NA 136  K 3.8  CL 108  CO2 20*  GLUCOSE 109*  BUN 11  CREATININE 0.96  CALCIUM 8.5*     Intake/Output Summary (Last 24 hours) at 08/28/2022 1345 Last data filed at 08/28/2022 0900 Gross per 24 hour  Intake 457.98 ml  Output --  Net 457.98 ml        Physical Exam: Vital Signs Blood pressure (!) 137/58, pulse 89, temperature 98.7 F (37.1 C), resp. rate 18, height 4\' 11"  (1.499 m), weight 79.5 kg, SpO2 98 %.      General: awake, alert, appropriate, sitting up in bed;  NAD HENT: conjugate gaze; oropharynx moist CV: regular rate; no JVD Pulmonary: CTA B/L; no W/R/R-nonlabored breathing, normal rate  GI: soft, ND, normal active BS Mild tenderness lower abdomen/R inguinal area, no rebound or guarding Psychiatric: appropriate- delayed responses; a little flat Neurological: still delayed- but problem solving a little better this AM Skin: Clean and intact without signs of breakdown, honeycomb dressing has a little spot 1/2 dime size that has dreid blood, but otherwise looks good Neuro:  Alert and oriented x4, follows simple commands, CN 2-12 intact, speech fluent Sensation intact to LT in all 4 extremities Strength 4/5 in b/l UE Strength 1-2 proximal RLE and 3/5 ankle PF and 1-2/5 DF  Strength 2-3 proximal LLE and 3/5 ankle PF and 2/5 DF  FTN intact b/l Musculoskeletal: No abnormal tone noted, no joint  swelling noted  Assessment/Plan: 1. Functional deficits which require 3+ hours per day of interdisciplinary therapy in a comprehensive inpatient rehab setting. Physiatrist is providing close team supervision and 24 hour management of active medical problems listed below. Physiatrist and rehab team continue to assess barriers to discharge/monitor patient progress toward functional and medical goals  Care Tool:  Bathing    Body parts bathed by patient: Right arm, Left arm, Chest, Abdomen, Front perineal area, Right upper leg, Left upper leg, Face   Body parts bathed by helper: Buttocks, Right lower leg, Left lower leg     Bathing assist Assist Level: Moderate Assistance - Patient 50 - 74%     Upper Body Dressing/Undressing Upper body dressing   What is the patient wearing?: Orthosis, Pull over shirt    Upper body assist Assist Level: Minimal Assistance - Patient > 75%    Lower Body Dressing/Undressing Lower body dressing      What is the patient wearing?: Pants     Lower body assist Assist for lower body dressing: Minimal Assistance - Patient > 75%  Toileting Toileting    Toileting assist Assist for toileting: Supervision/Verbal cueing     Transfers Chair/bed transfer  Transfers assist     Chair/bed transfer assist level: Contact Guard/Touching assist     Locomotion Ambulation   Ambulation assist      Assist level: Contact Guard/Touching assist Assistive device: Walker-rolling Max distance: 100   Walk 10 feet activity   Assist     Assist level: Contact Guard/Touching assist Assistive device: Walker-rolling   Walk 50 feet activity   Assist Walk 50 feet with 2 turns activity did not occur: Safety/medical concerns (unable to perform due to strength and activity tolerance deficits)  Assist level: Contact Guard/Touching assist Assistive device: Walker-rolling    Walk 150 feet activity   Assist Walk 150 feet activity did not occur:  Safety/medical concerns (unable to perform due to strength and activity tolerance deficits)         Walk 10 feet on uneven surface  activity   Assist     Assist level: Minimal Assistance - Patient > 75% Assistive device: Other (comment) (handrail)   Wheelchair     Assist Is the patient using a wheelchair?: Yes Type of Wheelchair: Manual    Wheelchair assist level: Minimal Assistance - Patient > 75% Max wheelchair distance: 150 ft    Wheelchair 50 feet with 2 turns activity    Assist        Assist Level: Minimal Assistance - Patient > 75%   Wheelchair 150 feet activity     Assist      Assist Level: Minimal Assistance - Patient > 75%   Blood pressure (!) 137/58, pulse 89, temperature 98.7 F (37.1 C), resp. rate 18, height 4\' 11"  (1.499 m), weight 79.5 kg, SpO2 98 %.  Medical Problem List and Plan: 1. 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  Antithrombotics: -DVT/anticoagulation:  Mechanical: Sequential compression devices, below knee Bilateral lower extremitiesTED hose             -antiplatelet therapy: none 3. Pain Management: Tylenol, Norco, Valium             - Polysubstance abuse history,  11/17- Pain elevated when doesn't take meds 4. Mood/Behavior/Sleep: LCSW to evaluate and provide emotional support             -depression: continue Abilify, Wellbutrin, trazodone             -anxiety: continue Atarax prn             -antipsychotic agents: n/a 5. Neuropsych/cognition: This patient is capable of making decisions on her own behalf. 6. Skin/Wound Care: routine skin care checks; monitor surgical incision- has honeycomb dressing in place  11/17- Surgery was 11/13- so will wait to remove honeycomb dressing- a  little sanguinous drainage in 1 small spot, but otherwise looks good.  7. Fluids/Electrolytes/Nutrition: Routine Is and Os and follow-up chemistries 8: DM: CBGs q AC and q HS (home>>Levemir 32 units q HS, metformin 500 mg daily, Novolog 18 units TID with meals). Hypoglycemia was worsened by recent steroid treatment but appears to be improving.              -  continue SSI q AC and q HS             -continue Levemir 15 units BID  11/16- BG's controlled- con't regimen  11/17- CBG's 140-168 except 1 spike to 201- will monitor for trend  -11/19 well controlled, continue to monitor 9: Obesity: BMI = 34.34 10: Hypertension: monitor TID and prn             -continue lisinopril 10 mg daily             -spironolactone on hold  -SBP occasionally is a little elevated, DBP a little decreased at times, continue current regimen and monitor 11: Hyperlipidemia: continue Lipitor 10mg  daily 12: Asthma/COPD: continue albuterol inhaler prn 13: Hyperthyroidism: continue methimazole 14: Gastroesophageal reflux disease: continue Protonix 15: Insomnia: continue Ambien 5 mg q HS prn (on trazodone 150 mg q HS) 16: Anemia: hemoglobin stable at 10.2; follow-up CBC             -continue iron supplements 17: AKI, metabolic acidosis: pre-renal. Continue IVFs and follow-up BMP, last Cr 1.32  11/16- Cr back to baseline- con't to monitor  11/18 discontinue IV fluids recheck BUN/Cr today 18. Hyperkalemia, improved with lokelma, spironolactone on hold             -Recheck levels tomorrow  11/16- K+ 3.8- doing better  -Recheck today 19. Confusion-with leukocytosis- mild to moderate  11/16- will check U/A and Cx esp since WBC is 12.6-   11/17- U/A shows Moderate leukocytes, no bacteria, but 21-50 WBCs- due ot Sx's. Will start Keflex 500 mg q8 hours x 5 days since not a complicated UTI.   -Recheck WBC 20. Constipation  11/16- LBM 1 week ago- said hasn't eatne much, but constipated- will do Sorbitol and SSE after therapy    11/17-had 2 med-large BM's yesterday- feels better   -11/19 LBM today, check abdominal xray 21. R leg and Lower abdominal R inguinal pain, says says its associated with pain shooting down her leg, reports she had BM today, reports eating ok without significant N/V  -will check labs today, abdominal xray  -possibly neuropathic pain, consider gabapentin     LOS: 3 days A FACE TO FACE EVALUATION WAS PERFORMED  Fanny Dance 08/27/2022, 4:23 PM

## 2022-08-27 NOTE — Progress Notes (Signed)
Occupational Therapy Session Note  Patient Details  Name: Kara Kelley MRN: [id number] Date of Birth: 16-Mar-1956  Today's Date: 08/27/2022 OT Individual Time: 3299-2426 OT Individual Time Calculation (min): 75 min    Short Term Goals: Week 1:  OT Short Term Goal 1 (Week 1): STG=LTG's 2/2 LOS  Skilled Therapeutic Interventions/Progress Updates:    Pt seated at EOB with dtr just leaving upon OT arrival. Pain reported less in LE's this am 6/10. Full dressing retraining with AE use with min cues to integrate as pt already bathed and toileted with dtr and nursing student. Required set up for UB dressing and min A for orthosis donning. Min A with AE for LB dressing incl socks and slip on shoes. Transported via w/c for time management to demo apt space for tub transfer bench training. Pt prefers shower seat vs TTB however at this time pt unable to safely make SLS step over tub lip. Required min cues and min a for TTB transfer. Educated pt in potential need for [facility] shower head and suction grab bar option and pt agreed. Issued LHS for shower training next visit if agreeable. Pt then transported via w/c to day room gym for NuStep for 3 sets of 2 min on L1. Rests in between. CGA on and off via SPT with RW. Once back in room, pt requested toileting and amb with RW with CGA. Reg toilet with grab bars and peri hygiene and clothing mngt with CGA. Stood sink side with CGA for hand washing, See flowsheets for data. Amb back to bed with CGA and doffed TLSO with S. Pt left EOB as per request with bed exit alarm set, needs and nurse call bell in reach.   Therapy Documentation Precautions:  Precautions Precautions: Fall, Back Precaution Booklet Issued: Yes (comment) Precaution Comments: reviewed back precautions, proper brace wear, and proper posture Required Braces or Orthoses: Spinal Brace Spinal Brace: Lumbar corset, Applied in sitting position Restrictions Weight Bearing Restrictions:  No    Therapy/Group: Individual Therapy  Vicenta Dunning 08/27/2022, 7:47 AM

## 2022-08-28 ENCOUNTER — Inpatient Hospital Stay (HOSPITAL_COMMUNITY): Payer: Medicaid Other

## 2022-08-28 DIAGNOSIS — I10 Essential (primary) hypertension: Secondary | ICD-10-CM | POA: Diagnosis not present

## 2022-08-28 DIAGNOSIS — N179 Acute kidney failure, unspecified: Secondary | ICD-10-CM | POA: Diagnosis not present

## 2022-08-28 DIAGNOSIS — E875 Hyperkalemia: Secondary | ICD-10-CM | POA: Diagnosis not present

## 2022-08-28 DIAGNOSIS — D72829 Elevated white blood cell count, unspecified: Secondary | ICD-10-CM

## 2022-08-28 DIAGNOSIS — M79604 Pain in right leg: Secondary | ICD-10-CM

## 2022-08-28 DIAGNOSIS — M48062 Spinal stenosis, lumbar region with neurogenic claudication: Secondary | ICD-10-CM | POA: Diagnosis not present

## 2022-08-28 LAB — CBC WITH DIFFERENTIAL/PLATELET
Abs Immature Granulocytes: 0.05 10*3/uL (ref 0.00–0.07)
Basophils Absolute: 0 10*3/uL (ref 0.0–0.1)
Basophils Relative: 0 %
Eosinophils Absolute: 0.3 10*3/uL (ref 0.0–0.5)
Eosinophils Relative: 3 %
HCT: 25.2 % — ABNORMAL LOW (ref 36.0–46.0)
Hemoglobin: 8.3 g/dL — ABNORMAL LOW (ref 12.0–15.0)
Immature Granulocytes: 1 %
Lymphocytes Relative: 18 %
Lymphs Abs: 1.6 10*3/uL (ref 0.7–4.0)
MCH: 24 pg — ABNORMAL LOW (ref 26.0–34.0)
MCHC: 32.9 g/dL (ref 30.0–36.0)
MCV: 72.8 fL — ABNORMAL LOW (ref 80.0–100.0)
Monocytes Absolute: 0.5 10*3/uL (ref 0.1–1.0)
Monocytes Relative: 6 %
Neutro Abs: 6.4 10*3/uL (ref 1.7–7.7)
Neutrophils Relative %: 72 %
Platelets: 219 10*3/uL (ref 150–400)
RBC: 3.46 MIL/uL — ABNORMAL LOW (ref 3.87–5.11)
RDW: 15 % (ref 11.5–15.5)
WBC: 8.9 10*3/uL (ref 4.0–10.5)
nRBC: 0 % (ref 0.0–0.2)

## 2022-08-28 LAB — COMPREHENSIVE METABOLIC PANEL
ALT: 22 U/L (ref 0–44)
AST: 18 U/L (ref 15–41)
Albumin: 2.8 g/dL — ABNORMAL LOW (ref 3.5–5.0)
Alkaline Phosphatase: 103 U/L (ref 38–126)
Anion gap: 12 (ref 5–15)
BUN: 6 mg/dL — ABNORMAL LOW (ref 8–23)
CO2: 20 mmol/L — ABNORMAL LOW (ref 22–32)
Calcium: 8.8 mg/dL — ABNORMAL LOW (ref 8.9–10.3)
Chloride: 103 mmol/L (ref 98–111)
Creatinine, Ser: 0.85 mg/dL (ref 0.44–1.00)
GFR, Estimated: >60 mL/min (ref >60)
Glucose, Bld: 191 mg/dL — ABNORMAL HIGH (ref 70–99)
Potassium: 3.8 mmol/L (ref 3.5–5.1)
Sodium: 135 mmol/L (ref 135–145)
Total Bilirubin: 0.4 mg/dL (ref 0.3–1.2)
Total Protein: 6.6 g/dL (ref 6.5–8.1)

## 2022-08-28 LAB — GLUCOSE, CAPILLARY
Glucose-Capillary: 134 mg/dL — ABNORMAL HIGH (ref 70–99)
Glucose-Capillary: 110 mg/dL — ABNORMAL HIGH (ref 70–99)
Glucose-Capillary: 142 mg/dL — ABNORMAL HIGH (ref 70–99)
Glucose-Capillary: 206 mg/dL — ABNORMAL HIGH (ref 70–99)

## 2022-08-28 MED ORDER — GABAPENTIN 100 MG PO CAPS
100.0000 mg | ORAL_CAPSULE | Freq: Three times a day (TID) | ORAL | Status: DC
  Administered 2022-08-28 (×2): 100 mg via ORAL
  Filled 2022-08-28 (×3): qty 1

## 2022-08-28 NOTE — Progress Notes (Signed)
Occupational Therapy Session Note  Patient Details  Name: Kara Kelley MRN: [id number] Date of Birth: 16-Jul-1956  Today's Date: 08/28/2022 OT Individual Time: 1100-1200 OT Individual Time Calculation (min): 60 min  OT Individual Time: 1445-1530 OT Individual Time Calculation (min): 45 min    Short Term Goals: Week 1:  OT Short Term Goal 1 (Week 1): STG=LTG's 2/2 LOS  Skilled Therapeutic Interventions/Progress Updates:     AM Session: Pt received in room sitting EOB with family member present. Pt reported she was tired, but was willing to participate in skilled therapy session. Pt reporting she was experiencing pain in R side 7/10- Rn notified. Pt donned TLSO sitting EOB with min A to tighten abdominal straps.   Pt sit>stand with RW close supervision. Pt ambulated within her room close supervision with her RW. Pt transported to therapy gym in wc total A.   Pt participate in skilled OT session with a focus on activity tolerance and dynamic standing balance. Pt completed standing corn hole game without AD CGA. Pt demonstrated LOB x1 and was able to self correct with min HHA. Following corn hole game, Pt retrieved bean bags using reacher and ambulating with RW. Pt educated on safety during functional mobility and reacher use. Pt demonstrating good teach back completing task with close supervision. Pt provided rest break following activity.   Pt reported need to use RM and was transported back to room total A in wc. Pt completed 3/3 toileting tasks with supervision, standing at sink to wash her hands.   Pt requested assistance with looking up a reacher online. Pt educated on options of where to purchase a reacher. Pt used her phone to call family member to relay education to them with plans of family member to Corporate treasurer. Pt was left sitting EOB with bed alarm on, call bell in reach, and all needs met.   PM Session: Pt received sitting EOB. Pt reporting 7/10 continuous pain in abdomin  but was willing to participate in therapy session. Pt reporting she discussed abdominal pains with MD and will be receiving x-rays soon. Pt stating she is feeling less anxious after speaking with MD. Pt donned TLSO sitting EOB with supervision. Pt transported to therapy gym in wc total A to prevent increase in abdominal pain.   Pt completed BITS activities (trail making B, bell cancellation, and image memory) standing with RW close supervision. Pt able to maintain dynamic standing balance for 4.5-5 minutes during tasks with rest breaks following. Pt able to maintain dynamic standing balance with single arm support, reaching outside BOS with RW close supervision. Pt was transported back to her room total A in wc and left resting in chair with seat belt alarm on, call bell in reach, and DTR present.   Therapy Documentation Precautions:  Precautions Precautions: Fall, Back Precaution Booklet Issued: Yes (comment) Precaution Comments: reviewed back precautions, proper brace wear, and proper posture Required Braces or Orthoses: Spinal Brace Spinal Brace: Lumbar corset, Applied in sitting position Restrictions Weight Bearing Restrictions: No General:     ADL: ADL Eating: Not assessed Where Assessed-Eating: Bed level Grooming: Supervision/safety Where Assessed-Grooming: Wheelchair, Sitting at sink Upper Body Bathing: Supervision/safety Where Assessed-Upper Body Bathing: Sitting at sink, Wheelchair Lower Body Bathing: Minimal assistance (feet) Where Assessed-Lower Body Bathing: Wheelchair, Sitting at sink, Standing at sink Upper Body Dressing: Setup, Supervision/safety Where Assessed-Upper Body Dressing: Wheelchair, Sitting at sink Lower Body Dressing: Minimal assistance (to donn L sock using sock aid) Where Assessed-Lower Body Dressing: Sitting at  sink, Standing at sink, Wheelchair Toileting: Contact guard Where Assessed-Toileting: Glass blower/designer: Therapist, music  Method: Arts development officer: Energy manager: Unable to assess Tub/Shower Transfer Method: Stand pivot Tub/Shower Equipment: Facilities manager: Environmental education officer Method: Unable to assess [employer]: Transfer tub bench ADL Comments: Pt completes sponge bath ADL  Therapy/Group: Individual Therapy  Janey Genta 08/28/2022, 11:33 AM

## 2022-08-28 NOTE — Progress Notes (Signed)
Pt with complaint of excruciating pain on the right lower quadrant, inguinal area. Stated pain started 3 days ago, but states she was waiting to see of it would resolve. Pt's daughter on the phone states that a family member had appendicitis and concerned that pt may be experiencing the same thing. Pain med and Maalox given for comfort and Dr. Natale Lay made aware. Awaiting orders at this time.    Marylu Lund, RN

## 2022-08-28 NOTE — Progress Notes (Signed)
Physical Therapy Session Note  Patient Details  Name: PARNEET GLANTZ MRN: [id number] Date of Birth: [birth year]  Today's Date: 08/28/2022 PT Missed Time: 30 Minutes Missed Time Reason: Pain  Short Term Goals: Week 1:  PT Short Term Goal 1 (Week 1): STG=LTG due ELOS  Skilled Therapeutic Interventions/Progress Updates:     MD rounding with patient on PT arrival. Pt reports increased radicular pain to RLE and missed 30 minutes of skilled PT due to pain. Plan to make up as able.   Therapy Documentation Precautions:  Precautions Precautions: Fall, Back Precaution Booklet Issued: Yes (comment) Precaution Comments: reviewed back precautions, proper brace wear, and proper posture Required Braces or Orthoses: Spinal Brace Spinal Brace: Lumbar corset, Applied in sitting position Restrictions Weight Bearing Restrictions: No      Therapy/Group: Individual Therapy  Truitt Leep Truitt Leep PT, DPT  08/28/2022, 2:01 PM

## 2022-08-28 NOTE — Progress Notes (Addendum)
PROGRESS NOTE   Subjective/Complaints:  Reports that she has pain shooting down the from of her right leg. She had similar pain in the past thought to be sciatica. She also has some pain around lower right abdomen/inguinal area and is concerned about appendix.  She has had this for few days and says she thought it would go away.   ROS:  Pt denies SOB, HA,  cough, CP, N/V/C/D, and vision changes  Except per HPI  Objective:   No results found. No results for input(s): "WBC", "HGB", "HCT", "PLT" in the last 72 hours.  No results for input(s): "NA", "K", "CL", "CO2", "GLUCOSE", "BUN", "CREATININE", "CALCIUM" in the last 72 hours.   Intake/Output Summary (Last 24 hours) at 08/28/2022 1409 Last data filed at 08/28/2022 0900 Gross per 24 hour  Intake 457.98 ml  Output --  Net 457.98 ml         Physical Exam: Vital Signs Blood pressure (!) 160/83, pulse 91, temperature 98.2 F (36.8 C), resp. rate 18, height 4\' 11"  (1.499 m), weight 79.5 kg, SpO2 100 %.      General: awake, alert, appropriate, sitting up in bed;  NAD HENT: conjugate gaze; oropharynx moist CV: regular rate; no JVD Pulmonary: CTA B/L; no W/R/R-nonlabored breathing, normal rate  GI: soft, ND, normal active BS Mild tenderness R lower abdomen/R inguinal area, no rebound or guarding Psychiatric: appropriate- delayed responses; a little flat Neurological: still delayed- but problem solving a little better this AM Skin: Clean and intact without signs of breakdown, honeycomb dressing has a little spot 1/2 dime size that has dreid blood, but otherwise looks good Neuro:  Alert and oriented x4, follows simple commands, CN 2-12 intact, speech fluent Sensation intact to LT in all 4 extremities Strength 4/5 in b/l UE Strength 1-2 proximal RLE and 3/5 ankle PF and 1-2/5 DF  Strength 2-3 proximal LLE and 3/5 ankle PF and 2/5 DF  FTN intact b/l Musculoskeletal: No  abnormal tone noted, no joint swelling noted  Assessment/Plan: 1. Functional deficits which require 3+ hours per day of interdisciplinary therapy in a comprehensive inpatient rehab setting. Physiatrist is providing close team supervision and 24 hour management of active medical problems listed below. Physiatrist and rehab team continue to assess barriers to discharge/monitor patient progress toward functional and medical goals  Care Tool:  Bathing    Body parts bathed by patient: Right arm, Left arm, Chest, Abdomen, Front perineal area, Right upper leg, Left upper leg, Face   Body parts bathed by helper: Buttocks, Right lower leg, Left lower leg     Bathing assist Assist Level: Moderate Assistance - Patient 50 - 74%     Upper Body Dressing/Undressing Upper body dressing   What is the patient wearing?: Orthosis, Pull over shirt    Upper body assist Assist Level: Minimal Assistance - Patient > 75%    Lower Body Dressing/Undressing Lower body dressing      What is the patient wearing?: Pants     Lower body assist Assist for lower body dressing: Minimal Assistance - Patient > 75%     Toileting Toileting    Toileting assist Assist for toileting: Supervision/Verbal cueing  Transfers Chair/bed transfer  Transfers assist     Chair/bed transfer assist level: Contact Guard/Touching assist     Locomotion Ambulation   Ambulation assist      Assist level: Contact Guard/Touching assist Assistive device: Walker-rolling Max distance: 100   Walk 10 feet activity   Assist     Assist level: Contact Guard/Touching assist Assistive device: Walker-rolling   Walk 50 feet activity   Assist Walk 50 feet with 2 turns activity did not occur: Safety/medical concerns (unable to perform due to strength and activity tolerance deficits)  Assist level: Contact Guard/Touching assist Assistive device: Walker-rolling    Walk 150 feet activity   Assist Walk 150 feet  activity did not occur: Safety/medical concerns (unable to perform due to strength and activity tolerance deficits)         Walk 10 feet on uneven surface  activity   Assist     Assist level: Minimal Assistance - Patient > 75% Assistive device: Other (comment) (handrail)   Wheelchair     Assist Is the patient using a wheelchair?: Yes Type of Wheelchair: Manual    Wheelchair assist level: Minimal Assistance - Patient > 75% Max wheelchair distance: 150 ft    Wheelchair 50 feet with 2 turns activity    Assist        Assist Level: Minimal Assistance - Patient > 75%   Wheelchair 150 feet activity     Assist      Assist Level: Minimal Assistance - Patient > 75%   Blood pressure (!) 160/83, pulse 91, temperature 98.2 F (36.8 C), resp. rate 18, height 4\' 11"  (1.499 m), weight 79.5 kg, SpO2 100 %.  Medical Problem List and Plan: 1. 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  Antithrombotics: -DVT/anticoagulation:  Mechanical: Sequential compression devices, below knee Bilateral lower extremitiesTED hose             -antiplatelet therapy: none 3. Pain Management: Tylenol, Norco, Valium             - Polysubstance abuse history,  11/17- Pain elevated when doesn't take meds 4. Mood/Behavior/Sleep: LCSW to evaluate and provide emotional support             -depression: continue Abilify, Wellbutrin, trazodone             -anxiety: continue Atarax prn             -antipsychotic agents: n/a 5. Neuropsych/cognition: This patient is capable of making decisions on her own behalf. 6. Skin/Wound Care: routine skin care checks; monitor surgical incision- has honeycomb dressing in place  11/17- Surgery was 11/13- so will wait to remove  honeycomb dressing- a little sanguinous drainage in 1 small spot, but otherwise looks good.  7. Fluids/Electrolytes/Nutrition: Routine Is and Os and follow-up chemistries 8: DM: CBGs q AC and q HS (home>>Levemir 32 units q HS, metformin 500 mg daily, Novolog 18 units TID with meals). Hypoglycemia was worsened by recent steroid treatment but appears to be improving.              -continue SSI q AC and  q HS             -continue Levemir 15 units BID  11/16- BG's controlled- con't regimen  11/17- CBG's 140-168 except 1 spike to 201- will monitor for trend  -11/19 well controlled, continue to monitor 9: Obesity: BMI = 34.34 10: Hypertension: monitor TID and prn             -continue lisinopril 10 mg daily             -spironolactone on hold  -SBP occasionally is a little elevated, DBP a little decreased at times, continue current regimen and monitor 11: Hyperlipidemia: continue Lipitor 10mg  daily 12: Asthma/COPD: continue albuterol inhaler prn 13: Hyperthyroidism: continue methimazole 14: Gastroesophageal reflux disease: continue Protonix 15: Insomnia: continue Ambien 5 mg q HS prn (on trazodone 150 mg q HS) 16: Anemia: hemoglobin stable at 10.2; follow-up CBC             -continue iron supplements 17: AKI, metabolic acidosis: pre-renal. Continue IVFs and follow-up BMP, last Cr 1.32  11/16- Cr back to baseline- con't to monitor  11/18 discontinue IV fluids recheck BUN/Cr today 18. Hyperkalemia, improved with lokelma, spironolactone on hold             -Recheck levels tomorrow  11/16- K+ 3.8- doing better  -Recheck today 19. Confusion-with leukocytosis- mild to moderate  11/16- will check U/A and Cx esp since WBC is 12.6-   11/17- U/A shows Moderate leukocytes, no bacteria, but 21-50 WBCs- due ot Sx's. Will start Keflex 500 mg q8 hours x 5 days since not a complicated UTI.   -Recheck WBC 20. Constipation  11/16- LBM 1 week ago- said hasn't eatne much, but constipated- will do Sorbitol and  SSE after therapy   11/17-had 2 med-large BM's yesterday- feels better   -11/19 LBM today, check abdominal xray 21. R leg and Lower abdominal R inguinal pain, says says its associated with pain shooting down her leg-reports this is similar to prior sciatica, reports she had BM today  -order RLE U/S to rule out appendicitis although do not think very likely-not available for adults  -will check labs today, abdominal xray  -possibly neuropathic pain, start gabapentin 100mg  TID  -addendum- WBC down to 8.9, pt has been eating, do not suspect appendicitis at this time but if pain worsens can check CT scan      LOS: 4 days A FACE TO FACE EVALUATION WAS PERFORMED  12/19 08/28/2022, 2:09 PM

## 2022-08-28 NOTE — Progress Notes (Signed)
Physical Therapy Session Note  Patient Details  Name: Kara Kelley MRN: [id number] Date of Birth: [birth year]  Today's Date: 08/28/2022 PT Individual Time: 0900-1000 PT Individual Time Calculation (min): 60 min   Short Term Goals: Week 1:  PT Short Term Goal 1 (Week 1): STG=LTG due ELOS  Skilled Therapeutic Interventions/Progress Updates:      Therapy Documentation Precautions:  Precautions Precautions: Fall, Back Precaution Booklet Issued: Yes (comment) Precaution Comments: reviewed back precautions, proper brace wear, and proper posture Required Braces or Orthoses: Spinal Brace Spinal Brace: Lumbar corset, Applied in sitting position Restrictions Weight Bearing Restrictions: No  Pt received semi-reclined in bed and agreeable to PT session. Pt with unrated R LE pain, pre-medicated. Pt requires supervision with supine>sit>stand and gait ~8 ft to toilet. Pt continent of bowel and bladder and requires supervision for peri-care and upper/lower body dressing. Pt ambulated ~75 ft to dayroom with supervision for safety with verbal cues for step length and pt able to progress to step-through gait pattern. Pt required sitting rest break and ambulated additional 190 ft with RW. Pt attempted six minute walk test following seated rest break and ambulated 100 ft in 1 min and 27 seconds before requiring sitting rest break. Pt transitioned to strength training and performed alternating 4 inch step taps with bilateral 2.5# ankle weights 3 x 10 with visual cues for technique. Pt performed 2 x 8 of mini squats and requires supervision for dynamic balance along with verbal and tactile cues for forms. Pt ambulated to room with RW and supervision for safety and left seated edge of bed with alarm on and all needs within reach.   Therapy/Group: Individual Therapy  Truitt Leep Truitt Leep PT, DPT  08/28/2022, 7:49 AM

## 2022-08-29 DIAGNOSIS — Z981 Arthrodesis status: Secondary | ICD-10-CM | POA: Diagnosis not present

## 2022-08-29 LAB — CBC
HCT: 23.3 % — ABNORMAL LOW (ref 36.0–46.0)
Hemoglobin: 7.7 g/dL — ABNORMAL LOW (ref 12.0–15.0)
MCH: 24 pg — ABNORMAL LOW (ref 26.0–34.0)
MCHC: 33 g/dL (ref 30.0–36.0)
MCV: 72.6 fL — ABNORMAL LOW (ref 80.0–100.0)
Platelets: 210 10*3/uL (ref 150–400)
RBC: 3.21 MIL/uL — ABNORMAL LOW (ref 3.87–5.11)
RDW: 15 % (ref 11.5–15.5)
WBC: 8.9 10*3/uL (ref 4.0–10.5)
nRBC: 0 % (ref 0.0–0.2)

## 2022-08-29 LAB — BASIC METABOLIC PANEL
Anion gap: 7 (ref 5–15)
BUN: 7 mg/dL — ABNORMAL LOW (ref 8–23)
CO2: 22 mmol/L (ref 22–32)
Calcium: 8.7 mg/dL — ABNORMAL LOW (ref 8.9–10.3)
Chloride: 109 mmol/L (ref 98–111)
Creatinine, Ser: 0.92 mg/dL (ref 0.44–1.00)
GFR, Estimated: >60 mL/min (ref >60)
Glucose, Bld: 153 mg/dL — ABNORMAL HIGH (ref 70–99)
Potassium: 4 mmol/L (ref 3.5–5.1)
Sodium: 138 mmol/L (ref 135–145)

## 2022-08-29 LAB — GLUCOSE, CAPILLARY
Glucose-Capillary: 125 mg/dL — ABNORMAL HIGH (ref 70–99)
Glucose-Capillary: 112 mg/dL — ABNORMAL HIGH (ref 70–99)
Glucose-Capillary: 229 mg/dL — ABNORMAL HIGH (ref 70–99)
Glucose-Capillary: 156 mg/dL — ABNORMAL HIGH (ref 70–99)

## 2022-08-29 MED ORDER — METHOCARBAMOL 500 MG PO TABS
500.0000 mg | ORAL_TABLET | Freq: Four times a day (QID) | ORAL | Status: DC
  Administered 2022-08-29 – 2022-09-01 (×13): 500 mg via ORAL
  Filled 2022-08-29 (×13): qty 1

## 2022-08-29 MED ORDER — GABAPENTIN 100 MG PO CAPS
200.0000 mg | ORAL_CAPSULE | Freq: Three times a day (TID) | ORAL | Status: DC
  Administered 2022-08-29 – 2022-09-01 (×10): 200 mg via ORAL
  Filled 2022-08-29 (×11): qty 2

## 2022-08-29 MED ORDER — DICLOFENAC SODIUM 1 % EX GEL
4.0000 g | Freq: Four times a day (QID) | CUTANEOUS | Status: DC
  Administered 2022-08-29 – 2022-09-01 (×6): 4 g via TOPICAL
  Filled 2022-08-29 (×2): qty 100

## 2022-08-29 MED FILL — Heparin Sodium (Porcine) Inj 1000 Unit/ML: INTRAMUSCULAR | Qty: 30 | Status: AC

## 2022-08-29 MED FILL — Sodium Chloride IV Soln 0.9%: INTRAVENOUS | Qty: 2000 | Status: AC

## 2022-08-29 NOTE — Progress Notes (Signed)
Occupational Therapy Session Note  Patient Details  Name: Kara Kelley MRN: [id number] Date of Birth: May 28, 1956  Today's Date: 08/29/2022 OT Individual Time: 0900-1000 OT Individual Time Calculation (min): 60 min    Short Term Goals: Week 1:  OT Short Term Goal 1 (Week 1): STG=LTG's 2/2 LOS  Skilled Therapeutic Interventions/Progress Updates:   Pt seen for AM OT session. Reports her overall LE pain is improving and MD added alternative pain meds to assist management. Pt at EOB ready to complete dressing part ofelf care routine but had bathed earlier and toileted with NT. OT provided use of reacher (daughter has purchased online for home use) and issued sock aide. Pt now mod I to use AE for doffing and donning LB clothing over feet and close S for sit to/from stand to pull up. Donning TLSO with mod I. Transported to demo tub room for [employer]. Performed sit to stand with S, RW amb 15 ft from hallway to tub shower, on/off TTB with S. Instructed in H2O mngt techniques for falls prevention as well as suction cup grab bar placement as family purchasing those for pt. Once back in room, pt left w/c level with chair exit alarm set, nurse call button and needs in reach.   Therapy Documentation Precautions:  Precautions Precautions: Fall, Back Precaution Booklet Issued: Yes (comment) Precaution Comments: reviewed back precautions, proper brace wear, and proper posture Required Braces or Orthoses: Spinal Brace Spinal Brace: Lumbar corset, Applied in sitting position Restrictions Weight Bearing Restrictions: No    Therapy/Group: Individual Therapy  Vicenta Dunning 08/29/2022, 7:47 AM

## 2022-08-29 NOTE — Progress Notes (Signed)
Physical Therapy Session Note  Patient Details  Name: Kara Kelley MRN: [id number] Date of Birth: 1956/10/01  Today's Date: 08/29/2022 PT Individual Time: 1346-1445 PT Individual Time Calculation (min): 59 min   Short Term Goals: Week 1:  PT Short Term Goal 1 (Week 1): STG=LTG due ELOS  Skilled Therapeutic Interventions/Progress Updates:      Therapy Documentation Precautions:  Precautions Precautions: Fall, Back Precaution Booklet Issued: Yes (comment) Precaution Comments: reviewed back precautions, proper brace wear, and proper posture Required Braces or Orthoses: Spinal Brace Spinal Brace: Lumbar corset, Applied in sitting position Restrictions Weight Bearing Restrictions: No  Pt agreeable to PT session and reports 8/10 low back and RLE pain, nursing notified and provided medication in session. Pt (S) for safety with bed mobility, sit to stand and gait ~100 ft to dayroom with RW. Pt ambulated additional 150 ft to main gym with bilateral 1.5# ankle weights and requires verbal cueing for increased foot clearance. Pt requires 16 seconds to perform five time sit to stand assessment with no UE support, demonstrating improvement from initial trial on 11/16 of 26 seconds. Pt navigated 12 steps with 2 HR's, car transfer ( stand pivot) and ramp with RW with supervision for safety. Pt ambulated from ortho gym to room (S) and left seated in w/c at bedside with all needs in reach.     Therapy/Group: Individual Therapy  Truitt Leep Truitt Leep PT, DPT  08/29/2022, 7:47 AM

## 2022-08-29 NOTE — Progress Notes (Addendum)
DC'd order for ultra sound per PA. Per patient she had a little cramping earlier but  that has resolved and stated she has no abdomen pain at this time. Notified on coming nurse Tilden Dome, LPN.

## 2022-08-29 NOTE — Progress Notes (Signed)
PROGRESS NOTE   Subjective/Complaints:  Pt reports still having RLE pain- which started a few days ago- thinks it's nerve pain.  Pain also in back has gotten worse- due to tight muscles in back.  Also B/L knees hurting- agreeable to voltaren gel for arthritis/  Thought was Saturday LBM last night.   ROS:  Pt denies SOB, abd pain, CP, N/V/C/D, and vision changes  Except per HPI  Objective:   DG Abd 2 Views  Result Date: 08/28/2022 CLINICAL DATA:  Abdominal pain mainly in right lower quadrant. EXAM: ABDOMEN - 2 VIEW COMPARISON:  None Available. FINDINGS: No evidence of dilated bowel loops or free intraperitoneal air. Right upper quadrant surgical clips are seen. Lumbar spine fusion hardware also noted. IMPRESSION: No acute findings. Electronically Signed   By: Danae Orleans M.D.   On: 08/28/2022 17:24   Recent Labs    08/28/22 1447 08/29/22 0449  WBC 8.9 8.9  HGB 8.3* 7.7*  HCT 25.2* 23.3*  PLT 219 210   Recent Labs    08/28/22 1447 08/29/22 0449  NA 135 138  K 3.8 4.0  CL 103 109  CO2 20* 22  GLUCOSE 191* 153*  BUN 6* 7*  CREATININE 0.85 0.92  CALCIUM 8.8* 8.7*    Intake/Output Summary (Last 24 hours) at 08/29/2022 0901 Last data filed at 08/28/2022 1700 Gross per 24 hour  Intake 360 ml  Output --  Net 360 ml        Physical Exam: Vital Signs Blood pressure (!) 151/75, pulse 86, temperature 98.2 F (36.8 C), resp. rate 18, height 4\' 11"  (1.499 m), weight 79.5 kg, SpO2 99 %.       General: awake, alert, appropriate, sitting up EOB eating; NAD HENT: conjugate gaze; oropharynx moist CV: regular rate; no JVD Pulmonary: CTA B/L; no W/R/R- good air movement GI: soft, NT, ND, (+)BS- no rebound;  Psychiatric: appropriate Neurological: delayed responses- thought was Saturday Skin: Clean and intact without signs of breakdown, honeycomb dressing has a little spot 1/2 dime size that has dried blood,  but otherwise looks good- no changes Neuro:  Alert and oriented x4, follows simple commands, CN 2-12 intact, speech fluent Sensation intact to LT in all 4 extremities Strength 4/5 in b/l UE Strength 1-2 proximal RLE and 3/5 ankle PF and 1-2/5 DF  Strength 2-3 proximal LLE and 3/5 ankle PF and 2/5 DF  FTN intact b/l Musculoskeletal: No abnormal tone noted, no joint swelling noted  Assessment/Plan: 1. Functional deficits which require 3+ hours per day of interdisciplinary therapy in a comprehensive inpatient rehab setting. Physiatrist is providing close team supervision and 24 hour management of active medical problems listed below. Physiatrist and rehab team continue to assess barriers to discharge/monitor patient progress toward functional and medical goals  Care Tool:  Bathing    Body parts bathed by patient: Right arm, Left arm, Chest, Abdomen, Front perineal area, Right upper leg, Left upper leg, Face   Body parts bathed by helper: Buttocks, Right lower leg, Left lower leg     Bathing assist Assist Level: Moderate Assistance - Patient 50 - 74%     Upper Body Dressing/Undressing Upper body dressing  What is the patient wearing?: Orthosis, Pull over shirt    Upper body assist Assist Level: Minimal Assistance - Patient > 75%    Lower Body Dressing/Undressing Lower body dressing      What is the patient wearing?: Pants     Lower body assist Assist for lower body dressing: Minimal Assistance - Patient > 75%     Toileting Toileting    Toileting assist Assist for toileting: Supervision/Verbal cueing     Transfers Chair/bed transfer  Transfers assist     Chair/bed transfer assist level: Contact Guard/Touching assist     Locomotion Ambulation   Ambulation assist      Assist level: Contact Guard/Touching assist Assistive device: Walker-rolling Max distance: 100   Walk 10 feet activity   Assist     Assist level: Contact Guard/Touching  assist Assistive device: Walker-rolling   Walk 50 feet activity   Assist Walk 50 feet with 2 turns activity did not occur: Safety/medical concerns (unable to perform due to strength and activity tolerance deficits)  Assist level: Contact Guard/Touching assist Assistive device: Walker-rolling    Walk 150 feet activity   Assist Walk 150 feet activity did not occur: Safety/medical concerns (unable to perform due to strength and activity tolerance deficits)         Walk 10 feet on uneven surface  activity   Assist     Assist level: Minimal Assistance - Patient > 75% Assistive device: Other (comment) (handrail)   Wheelchair     Assist Is the patient using a wheelchair?: Yes Type of Wheelchair: Manual    Wheelchair assist level: Minimal Assistance - Patient > 75% Max wheelchair distance: 150 ft    Wheelchair 50 feet with 2 turns activity    Assist        Assist Level: Minimal Assistance - Patient > 75%   Wheelchair 150 feet activity     Assist      Assist Level: Minimal Assistance - Patient > 75%   Blood pressure (!) 151/75, pulse 86, temperature 98.2 F (36.8 C), resp. rate 18, height 4\' 11"  (1.499 m), weight 79.5 kg, SpO2 99 %.  Medical Problem List and Plan: 1. 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  Antithrombotics: -DVT/anticoagulation:  Mechanical: Sequential compression devices, below knee Bilateral lower extremitiesTED hose             -antiplatelet therapy: none 3. Pain Management: Tylenol, Norco, Valium             - Polysubstance abuse history,  11/17- Pain elevated when doesn't take meds  11/20- will scheduled robaxin QID; add voltaren gel QID for B/L knees and  increase gabapentin 200 mg TID 4. Mood/Behavior/Sleep: LCSW to evaluate and provide emotional support             -depression: continue Abilify, Wellbutrin, trazodone             -anxiety: continue Atarax prn             -antipsychotic agents: n/a 5. Neuropsych/cognition: This patient is capable of making decisions on  her own behalf. 6. Skin/Wound Care: routine skin care checks; monitor surgical incision- has honeycomb dressing in place  11/17- Surgery was 11/13- so will wait to remove honeycomb dressing- a little sanguinous drainage in 1 small spot, but otherwise looks good.   11/20- will change ot dry dressing 7. Fluids/Electrolytes/Nutrition: Routine Is and Os and follow-up chemistries 8: DM: CBGs q AC and q HS (home>>Levemir 32 units q HS, metformin 500 mg daily, Novolog 18 units TID with meals). Hypoglycemia was worsened by recent steroid treatment but appears to be improving.              -continue SSI q AC and q HS             -continue Levemir 15 units BID  11/16- BG's controlled- con't regimen  11/17- CBG's 140-168 except 1 spike to 201- will monitor for trend  -11/19 well controlled, continue to monitor  11/20- BG's a little labile today 9: Obesity: BMI = 34.34 10: Hypertension: monitor TID and prn             -continue lisinopril 10 mg daily             -spironolactone on hold  -SBP occasionally is a little elevated, DBP a little decreased at times, continue current regimen and monitor  11/20- BP running high 130s and 150s systolic this AM- will give 1 more day before changing.  11: Hyperlipidemia: continue Lipitor 10mg  daily 12: Asthma/COPD: continue albuterol inhaler prn 13: Hyperthyroidism: continue methimazole 14: Gastroesophageal reflux disease: continue Protonix 15: Insomnia: continue Ambien 5 mg q HS prn (on trazodone 150 mg q HS) 16: Anemia: hemoglobin stable at 10.2; follow-up CBC             -continue iron supplements 17: AKI, metabolic acidosis: pre-renal. Continue  IVFs and follow-up BMP, last Cr 1.32  11/16- Cr back to baseline- con't to monitor  11/18 discontinue IV fluids recheck BUN/Cr today 18. Hyperkalemia, improved with lokelma, spironolactone on hold             -Recheck levels tomorrow  11/16- K+ 3.8- doing better  -Recheck today  11/20- K+ 4.0 19. Confusion-with leukocytosis- mild to moderate  11/16- will check U/A and Cx esp since WBC is 12.6-   11/17- U/A shows Moderate leukocytes, no bacteria, but 21-50 WBCs- due ot Sx's. Will start Keflex 500 mg q8 hours x 5 days since not a complicated UTI.   -Recheck WBC  11/20- WBC 8.9 20. Constipation  11/16- LBM 1 week ago- said hasn't eatne much, but constipated- will do Sorbitol and SSE after therapy   11/17-had 2 med-large BM's yesterday- feels better   -11/19 LBM today, check abdominal xray  11/20- LBM last night 21. R leg and Lower abdominal R inguinal pain, says says its associated with pain shooting down her leg-reports this is similar to prior sciatica, reports she had BM today  -order RLE U/S to rule out appendicitis although do not think very likely-not available for adults  -will check labs today, abdominal xray  -possibly neuropathic pain, start gabapentin 100mg  TID  -addendum- WBC down to 8.9, pt has been eating, do not suspect appendicitis at this time but if pain worsens can check CT scan 11/20- will increase gabapentin 200 mg TID - KUB looks fine- no acute findings.   I spent a total of 35   minutes on total care today- >50% coordination of care- due to education on Pain and changing so many meds  LOS: 5 days A FACE TO FACE EVALUATION WAS PERFORMED  Justyne Roell 08/29/2022, 9:01 AM

## 2022-08-29 NOTE — Progress Notes (Signed)
Occupational Therapy Session Note  Patient Details  Name: Kara Kelley MRN: [id number] Date of Birth: [birth year]  Today's Date: 08/29/2022 OT Individual Time: 1045-1200 OT Individual Time Calculation (min): 75 min    Short Term Goals: Week 1:  OT Short Term Goal 1 (Week 1): STG=LTG's 2/2 LOS  Skilled Therapeutic Interventions/Progress Updates:    Upon OT arrival, pt seated in w/c reporting pain in back and is agreeable to OT treatment. Treatment intervention with a focus on IADL retraining, strengthening, and endurance. Pt donns sweatshirt independently and was transported to ADL apartment via w/c and total A. Pt completes stand step transfer with RW and SBA and ambulates within kitchen to retrieve items from cabinets, drawers, appliances, and pantry. Pt able to perform task with SBA with one episode of LOB requiring Min A to correct. Pt educated on importance of slowing down when completing tasks to reduce risk of falls and pt receptive. Pt requires one seated rest break after performing task and then ambulates to return all items to their designated places with SBA. Seated rest break required. Pt then engages in bed making task placing blanket, comforter, and pillow cases onto bed while ambulating with the RW. Pt completes with SBA and requires 2 seated rest breaks. Pt was transported to dayroom gym via w/c and total A and sits at tabletop to replicate 2 complex graphic designs using B UE and 1.5lb wrist weights. Pt replicates first design without difficulty and second design with min difficulty. Pt drops 3 pegs during task. Increased time required to complete. Pt returns all pegs back to the bucket and was transported back to her room via w/c and total A. Pt left in w/c at end of session with all needs met and safety measures in place.   Therapy Documentation Precautions:  Precautions Precautions: Fall, Back Precaution Booklet Issued: Yes (comment) Precaution Comments: reviewed back  precautions, proper brace wear, and proper posture Required Braces or Orthoses: Spinal Brace Spinal Brace: Lumbar corset, Applied in sitting position Restrictions Weight Bearing Restrictions: No   Therapy/Group: Individual Therapy  Marvetta Gibbons 08/29/2022, 11:50 AM

## 2022-08-30 LAB — GLUCOSE, CAPILLARY
Glucose-Capillary: 130 mg/dL — ABNORMAL HIGH (ref 70–99)
Glucose-Capillary: 125 mg/dL — ABNORMAL HIGH (ref 70–99)
Glucose-Capillary: 125 mg/dL — ABNORMAL HIGH (ref 70–99)
Glucose-Capillary: 228 mg/dL — ABNORMAL HIGH (ref 70–99)

## 2022-08-30 NOTE — Progress Notes (Signed)
Physical Therapy Session Note  Patient Details  Name: Kara Kelley MRN: [id number] Date of Birth: Feb 09, 1956  Today's Date: 08/30/2022 PT Individual Time: 7858-8502, 7741-2878, 6767-2094  PT Individual Time Calculation (min): 45 min , 39 min, 29 min   Short Term Goals: Week 1:  PT Short Term Goal 1 (Week 1): STG=LTG due ELOS  Skilled Therapeutic Interventions/Progress Updates:      Therapy Documentation Precautions:  Precautions Precautions: Fall, Back Precaution Booklet Issued: Yes (comment) Precaution Comments: reviewed back precautions, proper brace wear, and proper posture Required Braces or Orthoses: Spinal Brace Spinal Brace: Lumbar corset, Applied in sitting position Restrictions Weight Bearing Restrictions: No  Treatment Session 1:  Pt agreeable to PT session with emphasis on dynamic sitting/standing balance and high level gait training. Pt with unrated low back pain, pre-medicated and pt requires supervision for upper/lower body dressing. Pt ambulated to dayroom with supervision for safety with RW. Pt ambulated without an AD with CGA for balance and intermittent HHA and negotiated agility ladder to facilitate increased foot clearance with gait 10 ft x 4. Pt ambulated to ~300 ft to with RW room and left seated edge of bed with alarm on and all needs within reach. PT adjusted recently delivered RW for pt to prepare for discharge.    Treatment Session 2:  Pt agreeable to additional PT session to make up for missed minutes from weekend. Pt without verbal complaints of pain and ambulated ~200 ft to main gym. Pt performed 3 x 10 body weight squats with ball with visual cues for form. Pt requires close (S) for safety for dynamic standing balance in normal and narrow stance with throwing and catching ball from rebounder 4 x 30 seconds. Pt requires CGA for dynamic standing balance on airex pad without UE support 2 x 1 min. Pt ambulated to room and left seated edge of bed with alarm  on and all needs within reach.    Treatment Session 3:  Pt agreeable to PT session with emphasis on transfers and strength training. Pt requires supervision for ambulation ~150 ft to main gym and with sit to lying. Pt performed gluteal bridges 1 x 5 and reported increased back pain and pt repositioned for comfort. Pt performed green theraband resisted sit to stands 2 x 10 with no UE's or AD with supervision for safety. Pt ambulated to room and left seated in recliner at bedside with all needs in reach and alarm on.    Therapy/Group: Individual Therapy  Truitt Leep Truitt Leep PT, DPT  08/30/2022, 7:37 AM

## 2022-08-30 NOTE — Progress Notes (Signed)
PROGRESS NOTE   Subjective/Complaints:  Pt reports all over pain much better this AM- knees and RLE.  However having abd pain and nausea this AM- was given meds- was very helpful.  LBM yesterday   ROS:  Pt denies SOB, abd pain, CP, N/V/C/D, and vision changes  Except per HPI  Objective:   DG Abd 2 Views  Result Date: 08/28/2022 CLINICAL DATA:  Abdominal pain mainly in right lower quadrant. EXAM: ABDOMEN - 2 VIEW COMPARISON:  None Available. FINDINGS: No evidence of dilated bowel loops or free intraperitoneal air. Right upper quadrant surgical clips are seen. Lumbar spine fusion hardware also noted. IMPRESSION: No acute findings. Electronically Signed   By: Danae Orleans M.D.   On: 08/28/2022 17:24   Recent Labs    08/28/22 1447 08/29/22 0449  WBC 8.9 8.9  HGB 8.3* 7.7*  HCT 25.2* 23.3*  PLT 219 210   Recent Labs    08/28/22 1447 08/29/22 0449  NA 135 138  K 3.8 4.0  CL 103 109  CO2 20* 22  GLUCOSE 191* 153*  BUN 6* 7*  CREATININE 0.85 0.92  CALCIUM 8.8* 8.7*    Intake/Output Summary (Last 24 hours) at 08/30/2022 2119 Last data filed at 08/29/2022 1756 Gross per 24 hour  Intake 297 ml  Output --  Net 297 ml        Physical Exam: Vital Signs Blood pressure (!) 126/55, pulse 81, temperature 98 F (36.7 C), resp. rate 18, height 4\' 11"  (1.499 m), weight 79.5 kg, SpO2 96 %.        General: awake, alert, appropriate, sitting up EOB; also saw walking with PT with RW; NAD HENT: conjugate gaze; oropharynx moist CV: regular rate; no JVD Pulmonary: CTA B/L; no W/R/R- good air movement GI: soft, NT, ND, (+)BS Psychiatric: appropriate Neurological: Ox3- slightly delayed responses  Skin: Clean and intact without signs of breakdown, honeycomb dressing has a little spot 1/2 dime size that has dried blood, but otherwise looks good- no changes Neuro:  Alert and oriented x4, follows simple commands, CN  2-12 intact, speech fluent Sensation intact to LT in all 4 extremities Strength 4/5 in b/l UE Strength 1-2 proximal RLE and 3/5 ankle PF and 1-2/5 DF  Strength 2-3 proximal LLE and 3/5 ankle PF and 2/5 DF  FTN intact b/l Musculoskeletal: No abnormal tone noted, no joint swelling noted  Assessment/Plan: 1. Functional deficits which require 3+ hours per day of interdisciplinary therapy in a comprehensive inpatient rehab setting. Physiatrist is providing close team supervision and 24 hour management of active medical problems listed below. Physiatrist and rehab team continue to assess barriers to discharge/monitor patient progress toward functional and medical goals  Care Tool:  Bathing    Body parts bathed by patient: Right arm, Left arm, Chest, Abdomen, Front perineal area, Right upper leg, Left upper leg, Face   Body parts bathed by helper: Buttocks, Right lower leg, Left lower leg     Bathing assist Assist Level: Moderate Assistance - Patient 50 - 74%     Upper Body Dressing/Undressing Upper body dressing   What is the patient wearing?: Orthosis, Pull over shirt    Upper  body assist Assist Level: Set up assist    Lower Body Dressing/Undressing Lower body dressing      What is the patient wearing?: Pants, Underwear/pull up     Lower body assist Assist for lower body dressing: Set up assist     Toileting Toileting    Toileting assist Assist for toileting: Supervision/Verbal cueing Assistive Device Comment: walker   Transfers Chair/bed transfer  Transfers assist     Chair/bed transfer assist level: Contact Guard/Touching assist     Locomotion Ambulation   Ambulation assist      Assist level: Contact Guard/Touching assist Assistive device: Walker-rolling Max distance: 100   Walk 10 feet activity   Assist     Assist level: Contact Guard/Touching assist Assistive device: Walker-rolling   Walk 50 feet activity   Assist Walk 50 feet with 2  turns activity did not occur: Safety/medical concerns (unable to perform due to strength and activity tolerance deficits)  Assist level: Contact Guard/Touching assist Assistive device: Walker-rolling    Walk 150 feet activity   Assist Walk 150 feet activity did not occur: Safety/medical concerns (unable to perform due to strength and activity tolerance deficits)         Walk 10 feet on uneven surface  activity   Assist     Assist level: Minimal Assistance - Patient > 75% Assistive device: Other (comment) (handrail)   Wheelchair     Assist Is the patient using a wheelchair?: Yes Type of Wheelchair: Manual    Wheelchair assist level: Minimal Assistance - Patient > 75% Max wheelchair distance: 150 ft    Wheelchair 50 feet with 2 turns activity    Assist        Assist Level: Minimal Assistance - Patient > 75%   Wheelchair 150 feet activity     Assist      Assist Level: Minimal Assistance - Patient > 75%   Blood pressure (!) 126/55, pulse 81, temperature 98 F (36.7 C), resp. rate 18, height 4\' 11"  (1.499 m), weight 79.5 kg, SpO2 96 %.  Medical Problem List and Plan: 1. 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  Antithrombotics: -DVT/anticoagulation:  Mechanical: Sequential compression devices, below knee Bilateral lower extremitiesTED hose             -antiplatelet therapy: none 3. Pain Management: Tylenol, Norco, Valium             - Polysubstance abuse history,  11/17- Pain elevated when doesn't take meds  11/20- will scheduled robaxin QID; add voltaren gel QID for B/L knees and increase gabapentin 200 mg TID  11/21- Much better this AM 4. Mood/Behavior/Sleep: LCSW to evaluate and provide  emotional support             -depression: continue Abilify, Wellbutrin, trazodone             -anxiety: continue Atarax prn             -antipsychotic agents: n/a 5. Neuropsych/cognition: This patient is capable of making decisions on her own behalf. 6. Skin/Wound Care: routine skin care checks; monitor surgical  incision- has honeycomb dressing in place  11/17- Surgery was 11/13- so will wait to remove honeycomb dressing- a little sanguinous drainage in 1 small spot, but otherwise looks good.   11/20- will change ot dry dressing 7. Fluids/Electrolytes/Nutrition: Routine Is and Os and follow-up chemistries 8: DM: CBGs q AC and q HS (home>>Levemir 32 units q HS, metformin 500 mg daily, Novolog 18 units TID with meals). Hypoglycemia was worsened by recent steroid treatment but appears to be improving.              -continue SSI q AC and q HS             -continue Levemir 15 units BID  11/16- BG's controlled- con't regimen  11/17- CBG's 140-168 except 1 spike to 201- will monitor for trend  -11/19 well controlled, continue to monitor  11/21- CBGs well controlled- con't regimen 9: Obesity: BMI = 34.34 10: Hypertension: monitor TID and prn             -continue lisinopril 10 mg daily             -spironolactone on hold  -SBP occasionally is a little elevated, DBP a little decreased at times, continue current regimen and monitor  11/20- BP running high 130s and 150s systolic this AM- will give 1 more day before changing. 11/21- BP much better- con't regimen  11: Hyperlipidemia: continue Lipitor 10mg  daily 12: Asthma/COPD: continue albuterol inhaler prn 13: Hyperthyroidism: continue methimazole 14: Gastroesophageal reflux disease: continue Protonix 15: Insomnia: continue Ambien 5 mg q HS prn (on trazodone 150 mg q HS) 16: Anemia: hemoglobin stable at 10.2; follow-up CBC             -continue iron supplements 17: AKI, metabolic acidosis: pre-renal. Continue IVFs and follow-up BMP, last Cr  1.32  11/16- Cr back to baseline- con't to monitor  11/18 discontinue IV fluids recheck BUN/Cr today 11/21- BUN 7 and Cr 0.92 18. Hyperkalemia, improved with lokelma, spironolactone on hold             -Recheck levels tomorrow  11/16- K+ 3.8- doing better  -Recheck today  11/20- K+ 4.0 19. Confusion-with leukocytosis- mild to moderate  11/16- will check U/A and Cx esp since WBC is 12.6-   11/17- U/A shows Moderate leukocytes, no bacteria, but 21-50 WBCs- due ot Sx's. Will start Keflex 500 mg q8 hours x 5 days since not a complicated UTI.   -Recheck WBC  11/20- WBC 8.9 20. Constipation  11/16- LBM 1 week ago- said hasn't eatne much, but constipated- will do Sorbitol and SSE after therapy   11/17-had 2 med-large BM's yesterday- feels better   -11/19 LBM today, check abdominal xray  11/20- LBM last night  11/21- LBM- LBM yesterday 21. R leg and Lower abdominal R inguinal pain, says says its associated with pain shooting down her leg-reports this is similar to prior sciatica, reports she had BM today  -order RLE U/S to rule out appendicitis although do not think very likely-not available for adults  -will check labs today, abdominal xray  -possibly neuropathic pain, start gabapentin 100mg  TID  -addendum- WBC down to 8.9, pt has been eating, do not suspect appendicitis at this time but if pain worsens can check CT scan 11/20- will increase gabapentin 200 mg TID - KUB looks fine- no acute findings.  11/21- nerve pain better this AM 22. Nausea  11/21- got nausea meds this Am- much better  I spent a total of  36    minutes on total care today- >50% coordination of care- due to team conference and d/w PT.    LOS: 6 days A FACE TO FACE EVALUATION WAS PERFORMED  Lynett Brasil 08/30/2022, 9:17 AM

## 2022-08-30 NOTE — Patient Care Conference (Signed)
Inpatient RehabilitationTeam Conference and Plan of Care Update Date: 08/30/2022   Time: 11:49 AM    Patient Name: Kara Kelley      Medical Record Number: [id number]  Date of Birth: [birth year] Sex: Female         Room/Bed: 4W17C/4W17C-01 Payor Info: Payor: MEDICAID [place] / Plan: MEDICAID [place] ACCESS / Product Type: *No Product type* /    Admit Date/Time:  08/24/2022  5:21 PM  Primary Diagnosis:  Spinal stenosis, lumbar  Hospital Problems: Principal Problem:   Spinal stenosis, lumbar    Expected Discharge Date: Expected Discharge Date: 09/01/22  Team Members Present: Physician leading conference: Dr. Genice Rouge Social Worker Present: Cecile Sheerer, LCSWA Nurse Present: Vedia Pereyra, RN PT Present: Truitt Leep, PT OT Present: Valetta Fuller, OT PPS Coordinator present : Fae Pippin, SLP     Current Status/Progress Goal Weekly Team Focus  Bowel/Bladder   Continent of b/b   Remain continent   Assist with toileting as needed    Swallow/Nutrition/ Hydration               ADL's   S for LB self care, mod I use of AE, mod I TLSO donning/doffing, I for UB self care   mod I   complete OT training in prep for d/c    Mobility   (S) bed mobility, transfers, gait x 300 ft and steps x 12 with 2 HR's, car transfers, ramp navigation   Mod I  activity tolerance, strength, pt/fam education    Communication                Safety/Cognition/ Behavioral Observations               Pain   Pain to back and right leg. Scheduled meds and prn Norco available   Pain <3/10   monitor for effectiveness and evaluate further need    Skin   Surgical incision   Remainder of skin to remain intact  assess Qshift and prn      Discharge Planning:  D/c to home alone with PRn support from dtr who will check in on her as she works. DME ordered- 3in1 BSC and TTB.   Team Discussion: Spinal lumbar stenosis. Continent B/B. Increased pain to lower back and right leg  managed with PRN medications. Incision has steri strips. Voltaren gel and gabapentin added for pain control. Nausea, labs, and B/P improved. CBGs okay. LSO brace when OOB.  Patient on target to meet rehab goals: yes, ambulating 300 feet and able to take 12 ste  *See Care Plan and progress notes for long and short-term goals.   Revisions to Treatment Plan:  Medication adjustments, monitor labs Teaching Needs: Medications, safety, gait/transfer training, etc.   Current Barriers to Discharge: Decreased caregiver support and Weight bearing restrictions  Possible Resolutions to Barriers: DME ordered, nursing education, out patient therapy     Medical Summary Current Status: nasuea- better- continent B/B- increased back/RLE pain- increased gabapentin and added voltaren gel for knee pain- incision has steristrips- looks OK  Barriers to Discharge: Weight bearing restrictions;Uncontrolled Pain  Barriers to Discharge Comments: has more support- daughters coming to check on her- home alone- she's at supervision- Possible Resolutions to [employer] Focus: at goal level - d/c 11/23- mod I to supervision now- doesn't need family ed- if she can get there, outpt therapies   Continued Need for Acute Rehabilitation Level of Care: The patient requires daily medical management by a physician with specialized training in physical  medicine and rehabilitation for the following reasons: Direction of a multidisciplinary physical rehabilitation program to maximize functional independence : Yes Medical management of patient stability for increased activity during participation in an intensive rehabilitation regime.: Yes Analysis of laboratory values and/or radiology reports with any subsequent need for medication adjustment and/or medical intervention. : Yes   I attest that I was present, lead the team conference, and concur with the assessment and plan of the team.   Jearld Adjutant 08/30/2022, 6:22  PM

## 2022-08-30 NOTE — Progress Notes (Signed)
Patient ID: Kara Kelley, female   DOB: 1956/02/04, 66 y.o.   MRN: [id number]  This SW covering for primary SW, [place].   SW met with pt in room, and updated pt dtr [name] as pt was on the phone with her. SW provided updates from team conference, ,and d/c date is tomorrow. SW discussed outpatient PT/OT. Preferred outpatient location is [facility]. All DME ordered and in room. Family will pick her up tomorrow. No questions/concerns reported.   Loralee Pacas, MSW, [place] Office: [phone] Cell: [phone] Fax: [phone]

## 2022-08-30 NOTE — Progress Notes (Signed)
Occupational Therapy Session Note  Patient Details  Name: Kara Kelley MRN: [id number] Date of Birth: January 10, 1956  Today's Date: 08/30/2022 OT Individual Time: [id number] 1415-1530 OT Individual Time Calculation (min): 90 min (picked up 30 min missed prior) 75 min    Short Term Goals: Week 1:  OT Short Term Goal 1 (Week 1): STG=LTG's 2/2 LOS  Skilled Therapeutic Interventions/Progress Updates:    1st Session:  Pt seen for am self care retraining. Preparation for Team Conf with OT most likely collaborating with team for pt discharge this week due to readiness and significant increased function. Pt agreeable to full shower routine and OT provided covering for waterproofing back incision. Pt amb with RW in and out of bathroom with mod I. On and off shower chair with mod I. Bathing with mod I with AE. Amb back to EOB for full dressing routine with AE with mod I. Donned TLSO mod I. Amb to recliner and trial with regular sock donning with sock aide mod I. Stood sink side for oral care up to 4 minutes. Standing level cane ex 12 reps x 2 sets for scap, sh, elbow. 4/10 pain reported overall. Left pt in recliner with chair pad alarm, call button and needs in reach.    2nd Session:  Pt reported increased back pain from last therapy visit. Nursing had just medicated pt and pt reported 6/190. OT suggesting use of ice pack upon return from planned outing for simulating community outing on 1st floor to gift shop and accessing various surfaces for sitting and positioning with focus on standing and activity tolerance as well. Pt for d/c Th therefore Grad Day tomorrow. Pt demo mod I for donning shoes. Amb 25 ft to w/c aned then OT transported pt to 1st floor and back for time management and joint protection. Parked w/c outside gift shop and pt able to amb with S around shopping spaces navigating other shoppers and items. Pt with seated rest after 10 minute amb. Pt then able to amb in atrium and access arm chair  and booth with S. Once back on unit pt instructed in 20 min use of ice pack to back with skin barrier. Pt demonstrated teach back.  Pain reported at 5/10 subsequently.  Left pt up in recliner with chair pad alarm, needs and nurse call button in reach.   Therapy Documentation Precautions:  Precautions Precautions: Fall, Back Precaution Booklet Issued: Yes (comment) Precaution Comments: reviewed back precautions, proper brace wear, and proper posture Required Braces or Orthoses: Spinal Brace Spinal Brace: Lumbar corset, Applied in sitting position Restrictions Weight Bearing Restrictions: No   Therapy/Group: Individual Therapy  Vicenta Dunning 08/30/2022, 7:47 AM

## 2022-08-31 ENCOUNTER — Other Ambulatory Visit: Payer: Self-pay | Admitting: Physician Assistant

## 2022-08-31 DIAGNOSIS — Z9889 Other specified postprocedural states: Secondary | ICD-10-CM

## 2022-08-31 DIAGNOSIS — E785 Hyperlipidemia, unspecified: Secondary | ICD-10-CM

## 2022-08-31 LAB — GLUCOSE, CAPILLARY
Glucose-Capillary: 168 mg/dL — ABNORMAL HIGH (ref 70–99)
Glucose-Capillary: 204 mg/dL — ABNORMAL HIGH (ref 70–99)
Glucose-Capillary: 290 mg/dL — ABNORMAL HIGH (ref 70–99)

## 2022-08-31 MED ORDER — INSULIN ASPART 100 UNIT/ML FLEXPEN: 5.0000 [IU] | PEN_INJECTOR | Freq: Three times a day (TID) | SUBCUTANEOUS | 0 refills | Status: DC

## 2022-08-31 MED ORDER — IRON (FERROUS SULFATE) 325 (65 FE) MG PO TABS: 325.0000 mg | ORAL_TABLET | Freq: Every day | ORAL | 2 refills | Status: DC

## 2022-08-31 MED ORDER — INSULIN DETEMIR 100 UNIT/ML ~~LOC~~ SOLN: 15.0000 [IU] | Freq: Two times a day (BID) | SUBCUTANEOUS | 11 refills | Status: DC

## 2022-08-31 MED ORDER — DICLOFENAC SODIUM 1 % EX GEL: 4.0000 g | Freq: Four times a day (QID) | CUTANEOUS | Status: DC

## 2022-08-31 MED ORDER — TRAZODONE HCL 100 MG PO TABS: 150.0000 mg | ORAL_TABLET | Freq: Every evening | ORAL | 0 refills | Status: DC | PRN

## 2022-08-31 MED ORDER — GABAPENTIN 100 MG PO CAPS: 200.0000 mg | ORAL_CAPSULE | Freq: Three times a day (TID) | ORAL | 0 refills | Status: DC

## 2022-08-31 MED ORDER — HYDROXYZINE HCL 25 MG PO TABS: 25.0000 mg | ORAL_TABLET | Freq: Three times a day (TID) | ORAL | 1 refills | Status: DC | PRN

## 2022-08-31 MED ORDER — FLUTICASONE FUROATE-VILANTEROL 200-25 MCG/ACT IN AEPB: INHALATION_SPRAY | RESPIRATORY_TRACT | Status: DC

## 2022-08-31 MED ORDER — ACCU-CHEK FASTCLIX LANCETS MISC: 99 refills | Status: DC

## 2022-08-31 MED ORDER — PROMETHAZINE HCL 25 MG PO TABS: 25.0000 mg | ORAL_TABLET | Freq: Three times a day (TID) | ORAL | 0 refills | Status: DC | PRN

## 2022-08-31 MED ORDER — METHOCARBAMOL 500 MG PO TABS: 500.0000 mg | ORAL_TABLET | Freq: Four times a day (QID) | ORAL | 1 refills | Status: DC | PRN

## 2022-08-31 MED ORDER — ATORVASTATIN CALCIUM 10 MG PO TABS: 10.0000 mg | ORAL_TABLET | Freq: Every day | ORAL | 0 refills | Status: DC

## 2022-08-31 MED ORDER — OMEPRAZOLE 40 MG PO CPDR: DELAYED_RELEASE_CAPSULE | ORAL | 1 refills | Status: DC

## 2022-08-31 MED ORDER — ACETAMINOPHEN 325 MG PO TABS: 325.0000 mg | ORAL_TABLET | ORAL | Status: DC | PRN

## 2022-08-31 NOTE — Progress Notes (Signed)
Physical Therapy Discharge Summary  Patient Details  Name: Kara Kelley MRN: [id number] Date of Birth: 09/29/1956  Date of Discharge from PT service:August 31, 2022  Today's Date: 08/31/2022 PT Individual Time: 1000-1100 PT Individual Time Calculation (min): 60 min    Patient has met 7 of 7 long term goals due to improved activity tolerance, improved balance, improved postural control, increased strength, increased range of motion, ability to compensate for deficits, and improved coordination.  Patient to discharge at an ambulatory level Modified Independent using RW.    Reasons goals not met: N/A, wheelchair goal not applicable because pt D/Cing at mod-I ambulatory level using RW  Recommendation:  Patient will benefit from ongoing skilled PT services in outpatient setting to continue to advance safe functional mobility, address ongoing impairments in strength, activity tolerance, and minimize fall risk.  Equipment: RW  Reasons for discharge: treatment goals met and discharge from hospital  Patient/family agrees with progress made and goals achieved: Yes  Daily Treatment Session:  Pt agreeable to PT and with unrated low back pain, pre-medicated. Pt mod I with sit to stand, gait x 10 ft to bathroom and with toilet transfer. PT assessed pain interference, sensation, strength, and coordination. Pt ambulated mod I to dayroom ~150 ft with RW. Pt mod I for dynamic standing balance while engaging in corn hole and dribbling/passing basketball. Pt ambulated to room mod I and left seated in recliner at bedside. Pt made modified independent in room.    PT Discharge Pain Interference Pain Interference Pain Effect on Sleep: 3. Frequently Pain Interference with Therapy Activities: 2. Occasionally Pain Interference with Day-to-Day Activities: 2. Occasionally Cognition Overall Cognitive Status: Within Functional Limits for tasks assessed Arousal/Alertness: Awake/alert Orientation Level:  Oriented X4 Memory: Appears intact Awareness: Appears intact Problem Solving: Appears intact Safety/Judgment: Appears intact Sensation Sensation Light Touch: Appears Intact Proprioception: Appears Intact Coordination Gross Motor Movements are Fluid and Coordinated: Yes Fine Motor Movements are Fluid and Coordinated: Yes Coordination and Movement Description: grossly coordinated due to improvements in strength and activity tolerance Finger Nose Finger Test: intact Heel Shin Test: intact Motor  Motor Motor: Within Functional Limits Motor - Skilled Clinical Observations: grossly coordinated and improved strength  Mobility Bed Mobility Bed Mobility: Rolling Right;Rolling Left;Sit to Supine;Supine to Sit Rolling Right: Independent Rolling Left: Independent Supine to Sit: Independent with assistive device Sit to Supine: Independent with assistive device Transfers Transfers: Sit to Stand;Stand to Sit;Stand Pivot Transfers Sit to Stand: Independent with assistive device Stand to Sit: Independent with assistive device Stand Pivot Transfers: Independent with assistive device Transfer (Assistive device): Rolling walker Locomotion  Gait Ambulation: Yes Gait Assistance: Independent with assistive device Gait Distance (Feet): 300 Feet (ft) Assistive device: Rolling walker Gait Gait: Yes Gait Pattern: Within Functional Limits Gait Pattern: Shuffle Gait velocity: decreased Stairs / Additional Locomotion Stairs: Yes Stairs Assistance: Independent with assistive device Stair Management Technique: Two rails Ramp: Independent with assistive device Curb: Independent with assistive device Pick up small object from the floor assist level: Independent with assistive device Wheelchair Mobility Wheelchair Mobility: No  Trunk/Postural Assessment  Cervical Assessment Cervical Assessment: Within Functional Limits Thoracic Assessment Thoracic Assessment: Within Functional Limits Lumbar  Assessment Lumbar Assessment: Exceptions to [facility] (posterior pelvic tilt) Postural Control Postural Control: Within Functional Limits  Balance Balance Balance Assessed: Yes Static Sitting Balance Static Sitting - Balance Support: Feet supported Static Sitting - Level of Assistance: 7: Independent Dynamic Sitting Balance Dynamic Sitting - Balance Support: Feet supported;During functional activity Dynamic Sitting -  Level of Assistance: 6: Modified independent (Device/Increase time) Static Standing Balance Static Standing - Balance Support: Bilateral upper extremity supported;During functional activity Static Standing - Level of Assistance: 6: Modified independent (Device/Increase time) Dynamic Standing Balance Dynamic Standing - Balance Support: Bilateral upper extremity supported;During functional activity Dynamic Standing - Level of Assistance: 6: Modified independent (Device/Increase time) Extremity Assessment  RLE Assessment RLE Assessment: Exceptions to [facility] General Strength Comments: grossly 4/5 except hip flexion 3+/5 LLE Assessment General Strength Comments: grossly 4/5 except hip flexion 3+/5   Tanja Port PT, DPT  08/31/2022, 10:17 AM

## 2022-08-31 NOTE — Discharge Instructions (Addendum)
Inpatient Rehab Discharge Instructions  Kara Kelley Discharge date and time: 09/01/2022  Activities/Precautions/ Functional Status: Activity: no lifting, driving, or strenuous exercise until cleared by MD Diet: diabetic diet Wound Care: keep wound clean and dry Functional status:  ___ No restrictions     ___ Walk up steps independently ___ 24/7 supervision/assistance   ___ Walk up steps with assistance __x_ Intermittent supervision/assistance  ___ Bathe/dress independently ___ Walk with walker     ___ Bathe/dress with assistance ___ Walk Independently    ___ Shower independently ___ Walk with assistance    _x__ Shower with assistance __x_ No alcohol     ___ Return to work/school ________  Special Instructions: No driving, alcohol consumption or tobacco use.  Recommend carb counting and do your finger sticks 4 times a day and record. Bring this information with you to follow-up appointment with primary care provider.   COMMUNITY REFERRALS UPON DISCHARGE:    Outpatient: PT      OT                 Agency:[facility]     Phone:[phone]              Appointment Date/Time:*Please expect follow-up within 7-10 business days to schedule your appointment. If you have not received follow-up, be sure to contact the site directly.*   Medical Equipment/Items Ordered:3in1 bedside commode, tub transfer bench, and rolling walker                                                 Agency/Supplier:[facility] [phone]   My questions have been answered and I understand these instructions. I will adhere to these goals and the provided educational materials after my discharge from the hospital.  Patient/Caregiver Signature _______________________________ Date __________  Clinician Signature _______________________________________ Date __________  Please bring this form and your medication list with you to all your follow-up doctor's appointments.

## 2022-08-31 NOTE — Progress Notes (Signed)
Occupational Therapy Discharge Summary  Patient Details  Name: Kara Kelley MRN: [id number] Date of Birth: 10/11/1955  Date of Discharge from OT service:August 31, 2022  Today's Date: 08/31/2022 OT Individual Time: 0800-0845 1st session, 1330-1430 2nd session  OT Individual Time Calculation (min): 45 min , 60 min    Patient has met 12 of 12 long term goals due to improved activity tolerance, improved balance, postural control, ability to compensate for deficits, and improved awareness.  Patient to discharge at overall Modified Independent level.  Patient's care partner is independent to provide the necessary physical assistance at discharge.    Reasons goals not met: n/a  Recommendation:  Patient will benefit from ongoing skilled OT services in outpatient setting to continue to advance functional skills in the area of BADL, iADL, and Reduce care partner burden.  Equipment: TTB, RW, 3 in 1, LH sponge and sock aide   Reasons for discharge: treatment goals met  Patient/family agrees with progress made and goals achieved: Yes  OT Discharge Precautions/Restrictions  Precautions Precautions: Fall;Back Precaution Booklet Issued: Yes (comment) Precaution Comments: reviewed back precautions, proper brace wear, and proper posture Required Braces or Orthoses: Spinal Brace Spinal Brace: Lumbar corset;Applied in sitting position Restrictions Weight Bearing Restrictions: No   Pain Pain Assessment Pain Scale: 0-10 Pain Score: 6  Pain Type: Surgical pain Pain Location: Leg Pain Orientation: Right Pain Descriptors / Indicators: Aching Pain Frequency: Constant Pain Onset: On-going Patients Stated Pain Goal: 4 Pain Intervention(s): Pain med given for lower pain score than stated, per patient request;Cold applied;Emotional support;Repositioned;Relaxation Multiple Pain Sites: No ADL ADL Equipment Provided: Sock aid, Long-handled sponge Eating: Independent Where Assessed-Eating:  Edge of bed Grooming: Independent Where Assessed-Grooming: Standing at sink Upper Body Bathing: Modified independent Where Assessed-Upper Body Bathing: Shower Lower Body Bathing: Modified independent Where Assessed-Lower Body Bathing: Shower Upper Body Dressing: Modified independent (Device) Where Assessed-Upper Body Dressing: Edge of bed Lower Body Dressing: Independent Where Assessed-Lower Body Dressing: Edge of bed Toileting: Modified independent Where Assessed-Toileting: Toilet Toilet Transfer: Modified independent Toilet Transfer Method: Stand pivot Toilet Transfer Equipment: Bedside commode Tub/Shower Transfer: Modified independent Tub/Shower Transfer Method: Stand pivot Tub/Shower Equipment: Transfer tub bench Walk-In Shower Transfer: Modified independent Walk-In Shower Transfer Method: Stand pivot Walk-In Shower Equipment: Shower seat with back ADL Comments: Pt mod I for all BADL's usign AE and DME. TLSO brace with mod I Vision Baseline Vision/History: 1 Wears glasses Patient Visual Report: No change from baseline Vision Assessment?: No apparent visual deficits Perception  Perception: Within Functional Limits Praxis Praxis: Intact Cognition Cognition Overall Cognitive Status: Within Functional Limits for tasks assessed Memory: Appears intact Attention: Sustained Awareness: Appears intact Problem Solving: Appears intact Safety/Judgment: Appears intact Comments: STM deficits resolved Brief Interview for Mental Status (BIMS) Repetition of Three Words (First Attempt): 3 Temporal Orientation: Year: Correct Temporal Orientation: Month: Accurate within 5 days Temporal Orientation: Day: Correct Recall: "Sock": Yes, no cue required Recall: "Blue": Yes, no cue required Recall: "Bed": Yes, no cue required BIMS Summary Score: 15 Sensation Sensation Light Touch: Appears Intact Hot/Cold: Appears Intact Stereognosis: Appears Intact Coordination Gross Motor Movements  are Fluid and Coordinated: Yes Fine Motor Movements are Fluid and Coordinated: Yes Coordination and Movement Description: grossly coordinated due to improvements in strength and activity tolerance Finger Nose Finger Test: intact Heel Shin Test: intact 9 Hole Peg Test: n/a 2/2 no FMC deficits Motor  Motor Motor: Within Functional Limits Motor - Skilled Clinical Observations: grossly coordinated and improved strength Mobility  Bed Mobility Bed   Occupational Therapy Discharge Summary  Patient Details  Name: Kara Kelley MRN: [id number] Date of Birth: 10/11/1955  Date of Discharge from OT service:August 31, 2022  Today's Date: 08/31/2022 OT Individual Time: 0800-0845 1st session, 1330-1430 2nd session  OT Individual Time Calculation (min): 45 min , 60 min    Patient has met 12 of 12 long term goals due to improved activity tolerance, improved balance, postural control, ability to compensate for deficits, and improved awareness.  Patient to discharge at overall Modified Independent level.  Patient's care partner is independent to provide the necessary physical assistance at discharge.    Reasons goals not met: n/a  Recommendation:  Patient will benefit from ongoing skilled OT services in outpatient setting to continue to advance functional skills in the area of BADL, iADL, and Reduce care partner burden.  Equipment: TTB, RW, 3 in 1, LH sponge and sock aide   Reasons for discharge: treatment goals met  Patient/family agrees with progress made and goals achieved: Yes  OT Discharge Precautions/Restrictions  Precautions Precautions: Fall;Back Precaution Booklet Issued: Yes (comment) Precaution Comments: reviewed back precautions, proper brace wear, and proper posture Required Braces or Orthoses: Spinal Brace Spinal Brace: Lumbar corset;Applied in sitting position Restrictions Weight Bearing Restrictions: No   Pain Pain Assessment Pain Scale: 0-10 Pain Score: 6  Pain Type: Surgical pain Pain Location: Leg Pain Orientation: Right Pain Descriptors / Indicators: Aching Pain Frequency: Constant Pain Onset: On-going Patients Stated Pain Goal: 4 Pain Intervention(s): Pain med given for lower pain score than stated, per patient request;Cold applied;Emotional support;Repositioned;Relaxation Multiple Pain Sites: No ADL ADL Equipment Provided: Sock aid, Long-handled sponge Eating: Independent Where Assessed-Eating:  Edge of bed Grooming: Independent Where Assessed-Grooming: Standing at sink Upper Body Bathing: Modified independent Where Assessed-Upper Body Bathing: Shower Lower Body Bathing: Modified independent Where Assessed-Lower Body Bathing: Shower Upper Body Dressing: Modified independent (Device) Where Assessed-Upper Body Dressing: Edge of bed Lower Body Dressing: Independent Where Assessed-Lower Body Dressing: Edge of bed Toileting: Modified independent Where Assessed-Toileting: Toilet Toilet Transfer: Modified independent Toilet Transfer Method: Stand pivot Toilet Transfer Equipment: Bedside commode Tub/Shower Transfer: Modified independent Tub/Shower Transfer Method: Stand pivot Tub/Shower Equipment: Transfer tub bench Walk-In Shower Transfer: Modified independent Walk-In Shower Transfer Method: Stand pivot Walk-In Shower Equipment: Shower seat with back ADL Comments: Pt mod I for all BADL's usign AE and DME. TLSO brace with mod I Vision Baseline Vision/History: 1 Wears glasses Patient Visual Report: No change from baseline Vision Assessment?: No apparent visual deficits Perception  Perception: Within Functional Limits Praxis Praxis: Intact Cognition Cognition Overall Cognitive Status: Within Functional Limits for tasks assessed Memory: Appears intact Attention: Sustained Awareness: Appears intact Problem Solving: Appears intact Safety/Judgment: Appears intact Comments: STM deficits resolved Brief Interview for Mental Status (BIMS) Repetition of Three Words (First Attempt): 3 Temporal Orientation: Year: Correct Temporal Orientation: Month: Accurate within 5 days Temporal Orientation: Day: Correct Recall: "Sock": Yes, no cue required Recall: "Blue": Yes, no cue required Recall: "Bed": Yes, no cue required BIMS Summary Score: 15 Sensation Sensation Light Touch: Appears Intact Hot/Cold: Appears Intact Stereognosis: Appears Intact Coordination Gross Motor Movements  are Fluid and Coordinated: Yes Fine Motor Movements are Fluid and Coordinated: Yes Coordination and Movement Description: grossly coordinated due to improvements in strength and activity tolerance Finger Nose Finger Test: intact Heel Shin Test: intact 9 Hole Peg Test: n/a 2/2 no FMC deficits Motor  Motor Motor: Within Functional Limits Motor - Skilled Clinical Observations: grossly coordinated and improved strength Mobility  Bed Mobility Bed

## 2022-08-31 NOTE — Progress Notes (Signed)
Patient ID: Kara Kelley, female   DOB: July 28, 1956, 66 y.o.   MRN: [id number]  SW faxed outpatient PT/OT referral to [facility] ([phone]/f:[phone]).  Cecile Sheerer, MSW, LCSWA Office: [phone] Cell: [phone] Fax: [phone]

## 2022-08-31 NOTE — Progress Notes (Signed)
Physical Therapy Session Note  Patient Details  Name: TAWANDA SCHALL MRN: [id number] Date of Birth: 1956/09/05  Today's Date: 08/31/2022 PT Individual Time: 1650-1730 PT Individual Time Calculation (min): 40 min   Short Term Goals: Week 1:  PT Short Term Goal 1 (Week 1): STG=LTG due ELOS  Skilled Therapeutic Interventions/Progress Updates:    Pt received sitting in recliner and agreeable to therapy session. Pt already mod-I in room using RW per primary therapy team. Pt started to stand up to RW prior to donning TLSO requiring cuing to remember to don it first. Sit<>stands using RW mod-I during session.   Gait ~123ft to main therapy gym using RW mod-I - pt demos decreased B LE foot clearance during swing and slower but adequate gait speed - required 1x standing rest break due to pt feeling SOB, monitored SpO2 and HR to be WNL during.  Therapist provided pt with the following HEP and led pt through the 1st set of the exercises and then had pt lead herself through 2nd set using the printout provided to ensure pt understanding.  Access Code: [email or web address] URL: [email or web address] Date: 08/31/2022 Prepared by: Casimiro Needle  Exercises - Sit to Stand  - 1 x daily - 7 x weekly - 2 sets - 10 reps - Heel Raises with Counter Support  - 1 x daily - 7 x weekly - 2 sets - 15 reps - Standing Hip Abduction with Counter Support  - 1 x daily - 7 x weekly - 2 sets - 10-15 reps - Standing March with Counter Support  - 1 x daily - 7 x weekly - 2 sets - 20 reps - Walking  - 1 x daily - 7 x weekly - 2 sets - 2 minutes hold  Therapist providing cuing throughout for proper form/technique and education on how to set-up environment at home to ensure her safety. Pt demos excellent understanding of exercises and denies any increased pain.  Gait back to room using RW mod-I; however, approximately half of the distance pt started experiencing pain near L flank area, performed standing rest break with  focus on breathing and relaxation for pain management. At end of session, pt left seated in recliner with needs in reach and room set-up for pt to move around mod-I using RW.    Therapy Documentation Precautions:  Precautions Precautions: Fall, Back Precaution Booklet Issued: Yes (comment) Precaution Comments: reviewed back precautions, proper brace wear, and proper posture Required Braces or Orthoses: Spinal Brace Spinal Brace: Lumbar corset, Applied in sitting position Restrictions Weight Bearing Restrictions: Yes   Pain: Reports she is having her "normal" amount of pain - no interventions needed during session. Started experiencing L flank pain while walking back from gym - details above.    Therapy/Group: Individual Therapy  Ginny Forth , PT, DPT, NCS, CSRS 08/31/2022, 4:31 PM

## 2022-08-31 NOTE — Progress Notes (Signed)
PROGRESS NOTE   Subjective/Complaints:  LBM yesterday Pain still there, but not "that bad".   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 Recent Labs    08/28/22 1447 08/29/22 0449  WBC 8.9 8.9  HGB 8.3* 7.7*  HCT 25.2* 23.3*  PLT 219 210   Recent Labs    08/28/22 1447 08/29/22 0449  NA 135 138  K 3.8 4.0  CL 103 109  CO2 20* 22  GLUCOSE 191* 153*  BUN 6* 7*  CREATININE 0.85 0.92  CALCIUM 8.8* 8.7*    Intake/Output Summary (Last 24 hours) at 08/31/2022 [id number] Last data filed at 08/30/2022 1900 Gross per 24 hour  Intake 480 ml  Output --  Net 480 ml        Physical Exam: Vital Signs Blood pressure (!) 158/72, pulse 81, temperature 98.4 F (36.9 C), temperature source Oral, resp. rate 18, height 4\' 11"  (1.499 m), weight 79.5 kg, SpO2 95 %.        General: awake, alert, appropriate, sitting at end of bed with feet heading to top of bed; NAD HENT: conjugate gaze; oropharynx moist CV: regular rate; no JVD Pulmonary: CTA B/L; no W/R/R- good air movement GI: soft, NT, ND, (+)BS Psychiatric: appropriate Neurological: Ox3  Skin: Clean and intact without signs of breakdown, honeycomb dressing has a little spot 1/2 dime size that has dried blood, but otherwise looks good- no changes Neuro:  Alert and oriented x4, follows simple commands, CN 2-12 intact, speech fluent Sensation intact to LT in all 4 extremities Strength 4/5 in b/l UE Strength 1-2 proximal RLE and 3/5 ankle PF and 1-2/5 DF  Strength 2-3 proximal LLE and 3/5 ankle PF and 2/5 DF  FTN intact b/l Musculoskeletal: No abnormal tone noted, no joint swelling noted  Assessment/Plan: 1. Functional deficits which require 3+ hours per day of interdisciplinary therapy in a comprehensive inpatient rehab setting. Physiatrist is providing close  team supervision and 24 hour management of active medical problems listed below. Physiatrist and rehab team continue to assess barriers to discharge/monitor patient progress toward functional and medical goals  Care Tool:  Bathing    Body parts bathed by patient: Right arm, Left arm, Chest, Abdomen, Front perineal area, Right upper leg, Left upper leg, Face, Right lower leg, Left lower leg, Buttocks   Body parts bathed by helper: Buttocks, Right lower leg, Left lower leg     Bathing assist Assist Level: Independent with assistive device     Upper Body Dressing/Undressing Upper body dressing   What is the patient wearing?: Orthosis, Pull over shirt    Upper body assist Assist Level: Independent    Lower Body Dressing/Undressing Lower body dressing      What is the patient wearing?: Pants, Underwear/pull up     Lower body assist Assist for lower body dressing: Independent with assitive device     Toileting Toileting    Toileting assist Assist for toileting: Independent Assistive Device Comment: walker   Transfers Chair/bed transfer  Transfers assist     Chair/bed transfer assist  level: Contact Guard/Touching assist     Locomotion Ambulation   Ambulation assist      Assist level: Contact Guard/Touching assist Assistive device: Walker-rolling Max distance: 100   Walk 10 feet activity   Assist     Assist level: Contact Guard/Touching assist Assistive device: Walker-rolling   Walk 50 feet activity   Assist Walk 50 feet with 2 turns activity did not occur: Safety/medical concerns (unable to perform due to strength and activity tolerance deficits)  Assist level: Contact Guard/Touching assist Assistive device: Walker-rolling    Walk 150 feet activity   Assist Walk 150 feet activity did not occur: Safety/medical concerns (unable to perform due to strength and activity tolerance deficits)         Walk 10 feet on uneven surface   activity   Assist     Assist level: Minimal Assistance - Patient > 75% Assistive device: Other (comment) (handrail)   Wheelchair     Assist Is the patient using a wheelchair?: Yes Type of Wheelchair: Manual    Wheelchair assist level: Minimal Assistance - Patient > 75% Max wheelchair distance: 150 ft    Wheelchair 50 feet with 2 turns activity    Assist        Assist Level: Minimal Assistance - Patient > 75%   Wheelchair 150 feet activity     Assist      Assist Level: Minimal Assistance - Patient > 75%   Blood pressure (!) 158/72, pulse 81, temperature 98.4 F (36.9 C), temperature source Oral, resp. rate 18, height 4\' 11"  (1.499 m), weight 79.5 kg, SpO2 95 %.  Medical Problem List and Plan: 1. 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  Antithrombotics: -DVT/anticoagulation:  Mechanical: Sequential compression devices, below knee Bilateral lower extremitiesTED hose             -antiplatelet therapy: none 3. Pain Management: Tylenol, Norco, Valium             - Polysubstance abuse history,  11/17- Pain elevated when doesn't take meds  11/20- will scheduled robaxin QID; add voltaren gel QID for B/L knees and increase gabapentin 200 mg TID  11/21- Much better this AM  11/22- discussed with pt, can have 7 days of pain meds, but will not give more- due to hx of polysubstance abuse- has been clean since 10/13/2017 per pt 4. Mood/Behavior/Sleep: LCSW to evaluate and provide emotional support             -depression: continue Abilify, Wellbutrin, trazodone             -anxiety: continue Atarax prn             -antipsychotic agents: n/a 5. Neuropsych/cognition: This patient is capable of making decisions on her own behalf. 6. Skin/Wound  Care: routine skin care checks; monitor surgical incision- has honeycomb dressing in place  11/17- Surgery was 11/13- so will wait to remove honeycomb dressing- a little sanguinous drainage in 1 small spot, but otherwise looks good.   11/20- will change ot dry dressing 7. Fluids/Electrolytes/Nutrition: Routine Is and Os and follow-up chemistries 8: DM: CBGs q AC and q  HS (home>>Levemir 32 units q HS, metformin 500 mg daily, Novolog 18 units TID with meals). Hypoglycemia was worsened by recent steroid treatment but appears to be improving.              -continue SSI q AC and q HS             -continue Levemir 15 units BID  11/16- BG's controlled- con't regimen  11/17- CBG's 140-168 except 1 spike to 201- will monitor for trend  -11/19 well controlled, continue to monitor  11/21- CBGs well controlled- con't regimen 9: Obesity: BMI = 34.34 10: Hypertension: monitor TID and prn             -continue lisinopril 10 mg daily             -spironolactone on hold  -SBP occasionally is a little elevated, DBP a little decreased at times, continue current regimen and monitor  11/20- BP running high 130s and 150s systolic this AM- will give 1 more day before changing. 11/21- BP much better- con't regimen 11/22- BP running slightly high- con't regimen  11: Hyperlipidemia: continue Lipitor 10mg  daily 12: Asthma/COPD: continue albuterol inhaler prn 13: Hyperthyroidism: continue methimazole 14: Gastroesophageal reflux disease: continue Protonix 15: Insomnia: continue Ambien 5 mg q HS prn (on trazodone 150 mg q HS) 16: Anemia: hemoglobin stable at 10.2; follow-up CBC             -continue iron supplements 17: AKI, metabolic acidosis: pre-renal. Continue IVFs and follow-up BMP, last Cr 1.32  11/16- Cr back to baseline- con't to monitor  11/18 discontinue IV fluids recheck BUN/Cr today 11/21- BUN 7 and Cr 0.92 18. Hyperkalemia, improved with lokelma, spironolactone on hold             -Recheck levels  tomorrow  11/16- K+ 3.8- doing better  -Recheck today  11/20- K+ 4.0 19. Confusion-with leukocytosis- mild to moderate  11/16- will check U/A and Cx esp since WBC is 12.6-   11/17- U/A shows Moderate leukocytes, no bacteria, but 21-50 WBCs- due ot Sx's. Will start Keflex 500 mg q8 hours x 5 days since not a complicated UTI.   -Recheck WBC  11/20- WBC 8.9 20. Constipation  11/16- LBM 1 week ago- said hasn't eatne much, but constipated- will do Sorbitol and SSE after therapy   11/17-had 2 med-large BM's yesterday- feels better   -11/19 LBM today, check abdominal xray  11/20- LBM last night  11/21- LBM- LBM yesterday  11/22- going regularly  21. R leg and Lower abdominal R inguinal pain, says says its associated with pain shooting down her leg-reports this is similar to prior sciatica, reports she had BM today  -order RLE U/S to rule out appendicitis although do not think very likely-not available for adults  -will check labs today, abdominal xray  -possibly neuropathic pain, start gabapentin 100mg  TID  -addendum- WBC down to 8.9, pt has been eating, do not suspect appendicitis at this time but if pain worsens can check CT scan 11/20- will increase gabapentin 200 mg TID - KUB looks fine- no acute findings.  11/21- nerve pain better this AM 22. Nausea  11/21- got nausea meds this Am- much better  11/22- Nausea resolved this AM   LOS: 7 days A FACE TO FACE EVALUATION WAS PERFORMED  Kara Kelley 08/31/2022, 9:09 AM

## 2022-09-01 LAB — GLUCOSE, CAPILLARY: Glucose-Capillary: 89 mg/dL (ref 70–99)

## 2022-09-01 NOTE — Progress Notes (Signed)
Inpatient Rehabilitation Discharge Medication Review by a Pharmacist  A complete drug regimen review was completed for this patient to identify any potential clinically significant medication issues.  High Risk Drug Classes Is patient taking? Indication by Medication  Antipsychotic Yes Prochloperazine- N/V, abilify-depression  Anticoagulant No   Antibiotic No   Opioid No   Antiplatelet No   Hypoglycemics/insulin Yes Aspart and detemir: T2DM  Vasoactive Medication No   Chemotherapy No   Other Yes Atorvastatin for HLD Bupropion for depression Methimazole for hyperthyroidism Pantoprazole for GERD Trazodone and zolpidem for insomnia  Acetaminophen for mild pain Hydroxyzine for anxiety  Methocarbamol for muscle spasms  Gabapentin-neuropathy     Type of Medication Issue Identified Description of Issue Recommendation(s)  Drug Interaction(s) (clinically significant)     Duplicate Therapy     Allergy     No Medication Administration End Date     Incorrect Dose     Additional Drug Therapy Needed     Significant med changes from prior encounter (inform family/care partners about these prior to discharge). HELD home Advair discus, glucagon, linzess, lisinopril, meloxicam, metformin, multivitamin, promethazine, ozempic, suvorexant, spironolactone, vitamin D  Resumed on home medication list for outpatient  Other       Clinically significant medication issues were identified that warrant physician communication and completion of prescribed/recommended actions by midnight of the next day:  No  Name of provider notified for urgent issues identified:   Provider Method of Notification:     Pharmacist comments:   Time spent performing this drug regimen review (minutes):  30   Kara Kelley, PharmD. [facility] Acute Care PGY-1 09/01/2022 11:59 AM

## 2022-09-01 NOTE — Progress Notes (Signed)
Inpatient Rehabilitation Care Coordinator Discharge Note   Patient Details  Name: Kara Kelley MRN: [id number] Date of Birth: 06-Sep-1956   Discharge location: D/c to home  Length of Stay: 7 days  Discharge activity level: ambulatory level Modified Independent using RW  Home/community participation: Limited  Patient response WK:[id number] Literacy - How often do you need to have someone help you when you read instructions, pamphlets, or other written material from your doctor or pharmacy?: Never  Patient response OT:RRNHAF Isolation - How often do you feel lonely or isolated from those around you?: Never  Services provided included: MD, RD, PT, OT, SLP, RN, CM, Pharmacy, Neuropsych, SW, TR  Financial Services:  Financial Services Utilized: Medicaid    Choices offered to/list presented to: Yes  Follow-up services arranged:  Outpatient, DME    Outpatient Servicies: [facility] for Outpatient PT/OT DME : [employer] for 3in1 bedside commode, tub transfer bench, and rolling walker    Patient response to transportation need: Is the patient able to respond to transportation needs?: Yes In the past 12 months, has lack of transportation kept you from medical appointments or from getting medications?: No In the past 12 months, has lack of transportation kept you from meetings, work, or from getting things needed for daily living?: No   Comments (or additional information):  Patient/Family verbalized understanding of follow-up arrangements:  Yes  Individual responsible for coordination of the follow-up plan: contact pt or pt dtr Kara Kelley  Confirmed correct DME delivered: Kara Kelley 09/01/2022    Kara Kelley

## 2022-09-01 NOTE — Progress Notes (Signed)
PROGRESS NOTE   Subjective/Complaints:  Pt reports knees bothering her, but plans on seeing her Orthopedist after d/c.   Back isn't too bad.   ROS:  Pt denies SOB, abd pain, CP, N/V/C/D, and vision changes  Except per HPI  Objective:   No results found. No results for input(s): "WBC", "HGB", "HCT", "PLT" in the last 72 hours.  No results for input(s): "NA", "K", "CL", "CO2", "GLUCOSE", "BUN", "CREATININE", "CALCIUM" in the last 72 hours.   Intake/Output Summary (Last 24 hours) at 09/01/2022 0821 Last data filed at 08/31/2022 2030 Gross per 24 hour  Intake 840 ml  Output --  Net 840 ml        Physical Exam: Vital Signs Blood pressure (!) 150/72, pulse 80, temperature 98.2 F (36.8 C), temperature source Oral, resp. rate 18, height 4\' 11"  (1.499 m), weight 79.5 kg, SpO2 100 %.         General: awake, alert, appropriate, sitting EOB- eating breakfast, NAD HENT: conjugate gaze; oropharynx moist CV: regular rate; no JVD Pulmonary: CTA B/L; no W/R/R- good air movement GI: soft, NT, ND, (+)BS Psychiatric: appropriate Neurological: Ox3   Skin: dry dressing- looks good- no bleeding, no drainage, no erythema Neuro:  Alert and oriented x4, follows simple commands, CN 2-12 intact, speech fluent Sensation intact to LT in all 4 extremities Strength 4/5 in b/l UE Strength 1-2 proximal RLE and 3/5 ankle PF and 1-2/5 DF  Strength 2-3 proximal LLE and 3/5 ankle PF and 2/5 DF  FTN intact b/l Musculoskeletal: No abnormal tone noted, no joint swelling noted  Assessment/Plan: 1. Functional deficits which require 3+ hours per day of interdisciplinary therapy in a comprehensive inpatient rehab setting. Physiatrist is providing close team supervision and 24 hour management of active medical problems listed below. Physiatrist and rehab team continue to assess barriers to discharge/monitor patient progress toward  functional and medical goals  Care Tool:  Bathing    Body parts bathed by patient: Right arm, Left arm, Chest, Abdomen, Front perineal area, Right upper leg, Left upper leg, Face, Right lower leg, Left lower leg, Buttocks   Body parts bathed by helper: Buttocks, Right lower leg, Left lower leg     Bathing assist Assist Level: Independent with assistive device     Upper Body Dressing/Undressing Upper body dressing   What is the patient wearing?: Orthosis, Pull over shirt    Upper body assist Assist Level: Independent    Lower Body Dressing/Undressing Lower body dressing      What is the patient wearing?: Pants, Underwear/pull up     Lower body assist Assist for lower body dressing: Independent with assitive device     Toileting Toileting    Toileting assist Assist for toileting: Independent Assistive Device Comment: walker   Transfers Chair/bed transfer  Transfers assist     Chair/bed transfer assist level: Independent with assistive device Chair/bed transfer assistive device:   Ambulation assist      Assist level: Independent with assistive device Assistive device: Walker-rolling Max distance: 300 ft   Walk 10 feet activity   Assist     Assist level: Independent with assistive device Assistive  device: Walker-rolling   Walk 50 feet activity   Assist Walk 50 feet with 2 turns activity did not occur: Safety/medical concerns (unable to perform due to strength and activity tolerance deficits)  Assist level: Independent with assistive device Assistive device: Walker-rolling    Walk 150 feet activity   Assist Walk 150 feet activity did not occur: Safety/medical concerns (unable to perform due to strength and activity tolerance deficits)  Assist level: Independent with assistive device Assistive device: Walker-rolling    Walk 10 feet on uneven surface  activity   Assist     Assist level: Independent with  assistive device Assistive device: Walker-rolling   Wheelchair     Assist Is the patient using a wheelchair?: No Type of Wheelchair: Manual    Wheelchair assist level: Minimal Assistance - Patient > 75% Max wheelchair distance: 150 ft    Wheelchair 50 feet with 2 turns activity    Assist        Assist Level: Minimal Assistance - Patient > 75%   Wheelchair 150 feet activity     Assist      Assist Level: Minimal Assistance - Patient > 75%   Blood pressure (!) 150/72, pulse 80, temperature 98.2 F (36.8 C), temperature source Oral, resp. rate 18, height 4\' 11"  (1.499 m), weight 79.5 kg, SpO2 100 %.  Medical Problem List and Plan: 1. 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  Antithrombotics: -DVT/anticoagulation:  Mechanical: Sequential compression devices, below knee Bilateral lower extremitiesTED hose             -antiplatelet therapy: none 3. Pain Management: Tylenol, Norco, Valium             - Polysubstance abuse history,  11/17- Pain elevated when doesn't take meds  11/20- will scheduled robaxin QID; add voltaren gel QID for B/L knees and increase gabapentin 200 mg TID  11/21- Much better this AM  11/22- discussed with pt, can have 7 days of pain meds, but will not give more- due to hx of polysubstance abuse- has been clean since 10/13/2017 per pt  11/23- needs to f/u with her Ortho for B/L knee pain 4. Mood/Behavior/Sleep: LCSW to evaluate and provide emotional support             -depression: continue Abilify, Wellbutrin, trazodone             -anxiety: continue Atarax prn             -antipsychotic agents: n/a 5. Neuropsych/cognition: This patient is capable of making decisions on her own behalf. 6. Skin/Wound Care: routine skin care checks; monitor surgical  incision- has honeycomb dressing in place  11/17- Surgery was 11/13- so will wait to remove honeycomb dressing- a little sanguinous drainage in 1 small spot, but otherwise looks good.   11/20- will change ot dry dressing 7. Fluids/Electrolytes/Nutrition: Routine Is and Os and follow-up chemistries 8: DM: CBGs q AC and q HS (home>>Levemir 32 units q HS, metformin 500 mg daily, Novolog 18 units TID with meals). Hypoglycemia was worsened by recent steroid treatment but appears to be improving.              -continue  SSI q AC and q HS             -continue Levemir 15 units BID  11/16- BG's controlled- con't regimen  11/17- CBG's 140-168 except 1 spike to 201- will monitor for trend  -11/19 well controlled, continue to monitor  11/21- CBGs well controlled- con't regimen 9: Obesity: BMI = 34.34 10: Hypertension: monitor TID and prn             -continue lisinopril 10 mg daily             -spironolactone on hold  -SBP occasionally is a little elevated, DBP a little decreased at times, continue current regimen and monitor  11/20- BP running high 130s and 150s systolic this AM- will give 1 more day before changing. 11/21- BP much better- con't regimen 11/22- BP running slightly high- con't regimen  11: Hyperlipidemia: continue Lipitor 10mg  daily 12: Asthma/COPD: continue albuterol inhaler prn 13: Hyperthyroidism: continue methimazole 14: Gastroesophageal reflux disease: continue Protonix 15: Insomnia: continue Ambien 5 mg q HS prn (on trazodone 150 mg q HS) 16: Anemia: hemoglobin stable at 10.2; follow-up CBC             -continue iron supplements 17: AKI, metabolic acidosis: pre-renal. Continue IVFs and follow-up BMP, last Cr 1.32  11/16- Cr back to baseline- con't to monitor  11/18 discontinue IV fluids recheck BUN/Cr today 11/21- BUN 7 and Cr 0.92 18. Hyperkalemia, improved with lokelma, spironolactone on hold             -Recheck levels tomorrow  11/16- K+ 3.8- doing better  -Recheck  today  11/20- K+ 4.0 19. Confusion-with leukocytosis- mild to moderate  11/16- will check U/A and Cx esp since WBC is 12.6-   11/17- U/A shows Moderate leukocytes, no bacteria, but 21-50 WBCs- due ot Sx's. Will start Keflex 500 mg q8 hours x 5 days since not a complicated UTI.   -Recheck WBC  11/20- WBC 8.9 20. Constipation  11/16- LBM 1 week ago- said hasn't eatne much, but constipated- will do Sorbitol and SSE after therapy   11/17-had 2 med-large BM's yesterday- feels better   -11/19 LBM today, check abdominal xray  11/20- LBM last night  11/21- LBM- LBM yesterday  11/22- going regularly  21. R leg and Lower abdominal R inguinal pain, says says its associated with pain shooting down her leg-reports this is similar to prior sciatica, reports she had BM today  -order RLE U/S to rule out appendicitis although do not think very likely-not available for adults  -will check labs today, abdominal xray  -possibly neuropathic pain, start gabapentin 100mg  TID  -addendum- WBC down to 8.9, pt has been eating, do not suspect appendicitis at this time but if pain worsens can check CT scan 11/20- will increase gabapentin 200 mg TID - KUB looks fine- no acute findings.  11/21- nerve pain better this AM 22. Nausea  11/21- got nausea meds this Am- much better  11/22- Nausea resolved this AM   LOS: 8 days A FACE TO FACE EVALUATION WAS PERFORMED  Kara Kelley 09/01/2022, 8:21 AM

## 2022-09-02 NOTE — Progress Notes (Signed)
Patient called this afternoon asking for hydrocodone as given to her while in rehab unit for hip and back pain. She had back surgery by Dr. Jordan Likes on 08/19/2022. I did not prescribe any narcotic on 08/31/2022, the day before discharge. (She went home on Thanksgiving Day.) According to PDMP, she had oxycodone 5/325 #60 filled on 07/25/2022. She tells me today she cannot find the bottle. I encouraged her to find the bottle as she may need to turn that in the case another provider will prescribe hydrocodone. She verbalizes understanding.

## 2022-09-05 ENCOUNTER — Telehealth: Payer: Self-pay

## 2022-09-05 ENCOUNTER — Other Ambulatory Visit: Payer: Self-pay | Admitting: Pharmacist

## 2022-09-05 MED ORDER — LEVEMIR FLEXTOUCH 100 UNIT/ML ~~LOC~~ SOPN: 15.0000 [IU] | PEN_INJECTOR | Freq: Two times a day (BID) | SUBCUTANEOUS | 0 refills | Status: DC

## 2022-09-05 NOTE — Telephone Encounter (Signed)
Transition Care Management Follow-up Telephone Call Date of discharge and from where: 08/31/2022, [facility] How have you been since you were released from the hospital? She said its " rough " sometimes getting around but she is doing better Any questions or concerns? Yes- She stated that she was not given a levemir flexpen and instead was given vial and syringes and she is not able to use them.  She has the novolog flexpen.   Items Reviewed: Did the pt receive and understand the discharge instructions provided? Yes  Medications obtained and verified? Yes - she said she has all of her medications as well as a working glucometer but needs a levemr flexpen. She did not have any questions about the med regime,  She reported that her blood sugar this morning was 123.  Other? No  Any new allergies since your discharge? No  Dietary orders reviewed? Yes Do you have support at home? Yes - her sister comes to check on her daily and her daughter checks on her in the afternoon.   Home Care and Equipment/Supplies: Were home health services ordered? no If so, what is the name of the agency? N/a  Has the agency set up a time to come to the patient's home? not applicable Were any new equipment or medical supplies ordered?  Yes: RW, BSC, TTB What is the name of the medical supply agency? [employer]  Were you able to get the supplies/equipment? yes Do you have any questions related to the use of the equipment or supplies? No  Functional Questionnaire: (I = Independent and D = Dependent) ADLs: ambulates with RW. Her sister and daughter assist with ADLS as needed.    Follow up appointments reviewed:  PCP Hospital f/u appt confirmed? Yes  Scheduled to see Ricky Stabs, NP - 09/15/2022.  Specialist Hospital f/u appt confirmed? Yes  Scheduled to see outpatient neurorehab - 09/08/2022/ needs to schedule an appointment with PMR, neurology and endocrinology.  Are transportation  arrangements needed? No  If their condition worsens, is the pt aware to call PCP or go to the Emergency Dept.? Yes Was the patient provided with contact information for the PCP's office or ED? Yes Was to pt encouraged to call back with questions or concerns? Yes

## 2022-09-06 ENCOUNTER — Other Ambulatory Visit: Payer: Self-pay | Admitting: Pharmacist

## 2022-09-06 MED ORDER — "INSULIN SYRINGE 30G X 5/16"" 0.3 ML MISC": 2 refills | Status: DC

## 2022-09-07 NOTE — Telephone Encounter (Signed)
Noted. The patient was planning to use the flexpen instead of the vials/ syringes

## 2022-09-08 ENCOUNTER — Ambulatory Visit: Payer: Medicaid Other | Attending: Physician Assistant | Admitting: Physical Therapy

## 2022-09-08 ENCOUNTER — Encounter: Payer: Self-pay | Admitting: Physical Therapy

## 2022-09-08 ENCOUNTER — Other Ambulatory Visit: Payer: Self-pay

## 2022-09-08 VITALS — BP 142/88

## 2022-09-08 DIAGNOSIS — M25562 Pain in left knee: Secondary | ICD-10-CM

## 2022-09-08 DIAGNOSIS — M6281 Muscle weakness (generalized): Secondary | ICD-10-CM | POA: Diagnosis not present

## 2022-09-08 DIAGNOSIS — M48062 Spinal stenosis, lumbar region with neurogenic claudication: Secondary | ICD-10-CM | POA: Insufficient documentation

## 2022-09-08 DIAGNOSIS — M5459 Other low back pain: Secondary | ICD-10-CM

## 2022-09-08 DIAGNOSIS — M25561 Pain in right knee: Secondary | ICD-10-CM | POA: Diagnosis present

## 2022-09-08 DIAGNOSIS — R2689 Other abnormalities of gait and mobility: Secondary | ICD-10-CM

## 2022-09-08 NOTE — Therapy (Signed)
OUTPATIENT PHYSICAL THERAPY NEURO EVALUATION   Patient Name: Kara Kelley MRN: [id number] DOB:[birth year], 66 y.o., female Today's Date: 09/08/2022   PCP: Rema Fendt, NP REFERRING PROVIDER: Milinda Antis, PA-C  END OF SESSION:  PT End of Session - 09/08/22 1545     Visit Number 1    Number of Visits 9   8+eval   Date for PT Re-Evaluation 11/11/22   pushed out due to delay in scheduling   Authorization Type Traditional Medicaid    Authorization - Visit Number 27    PT Start Time 1530    PT Stop Time 1620    PT Time Calculation (min) 50 min    Equipment Utilized During Treatment Other (comment)   TLSO   Activity Tolerance Patient tolerated treatment well    Behavior During Therapy WFL for tasks assessed/performed             Past Medical History:  Diagnosis Date   Anemia    Anxiety    Arthritis    Asthma    Bunion    Callus    Chronic pain    Cocaine abuse (HCC)    COPD (chronic obstructive pulmonary disease) (HCC)    Corns and callosities    Degenerative joint disease    Depression    Diabetes mellitus    Endometrial polyp    ETOH abuse    Gall stones    GERD (gastroesophageal reflux disease)    Headache    history of Migraines   Hepatitis C    Hep C   Hyperlipidemia    Hypertension    Insomnia    Spondylolisthesis of lumbar region    Substance abuse (HCC)    alcoholism   Tuberculosis 1985   Wears dentures    Wears glasses    Past Surgical History:  Procedure Laterality Date   ANTERIOR CERVICAL DECOMP/DISCECTOMY FUSION N/A 10/26/2021   Procedure: ACDF - C4-C5 - C5-C6 - C6-C7;  Surgeon: Julio Sicks, MD;  Location: [facility];  Service: Neurosurgery;  Laterality: N/A;   BACK SURGERY  2018   CESAREAN SECTION     x3   CHOLECYSTECTOMY N/A 08/11/2016   Procedure: LAPAROSCOPIC CHOLECYSTECTOMY WITH   INTRAOPERATIVE CHOLANGIOGRAM;  Surgeon: Almond Lint, MD;  Location: [facility];  Service: General;  Laterality: N/A;   COLONOSCOPY     Cotton Osteotomy  w/ Graft Left 06/18/2009   Excision of Benign Lesion Right 01/30/2013   Rt Plantar   FOOT SURGERY     HAMMER TOE REPAIR Right 02/12/2016   RIGHT #5   Hammertoe Repair Left 06/18/2009   Lt #5   HYSTEROSCOPY N/A 06/20/2017   Procedure: DILATION AND CURETTAGE, HYSTEROSCOPY w/ Polypectomy;  Surgeon: Willodean Rosenthal, MD;  Location: [facility];  Service: Gynecology;  Laterality: N/A;   LUMBAR FUSION  2018   METATARSAL OSTEOTOMY Left 06/18/2009   #5   MULTIPLE TOOTH EXTRACTIONS     Nail Matrixectomy Left 06/18/2009   LT #1   OSTEOTOMY Right 01/30/2013   Rt #5   Phalangectomy Left 06/18/2009   LT #1   Phalangectomy Right 01/30/2013   Rt #1   TUBAL LIGATION     Patient Active Problem List   Diagnosis Date Noted   Spinal stenosis, lumbar 08/24/2022   Falls, initial encounter 08/20/2022   Frequent falls 08/20/2022   Leg weakness 08/20/2022   Lumbar stenosis 08/20/2022   Microcytic hypochromic anemia 08/20/2022   Bacterial vaginosis 05/16/2022   Cholelithiasis without obstruction 04/11/2022  Chronic idiopathic constipation 04/11/2022   Gallbladder calculus with acute cholecystitis and no obstruction 04/11/2022   History of adenomatous polyp of colon 04/11/2022   Morbid obesity (HCC) 04/11/2022   Polysubstance abuse (HCC) 04/11/2022   Right lower quadrant pain 04/11/2022   Type 2 diabetes mellitus (HCC) 04/11/2022   Type II diabetes mellitus (HCC) 04/11/2022   Pharyngoesophageal dysphagia 04/05/2022   Hyperthyroidism 12/29/2021   Cervical spondylosis with myelopathy and radiculopathy 10/26/2021   Bilateral impacted cerumen 09/16/2020   Itching of ear 09/16/2020   Heartburn 07/10/2020   Lumbar radiculopathy 04/03/2020   Hearing loss of right ear 03/12/2020   Plantar flexed metatarsal bone of left foot 08/14/2019   Skin burn 07/09/2019   Uncontrolled type 2 diabetes mellitus with hyperglycemia (HCC) 06/19/2019   Hypertensive retinopathy of both eyes 03/06/2019   Mild nonproliferative  diabetic retinopathy of both eyes (HCC) 03/06/2019   Essential hypertension 10/05/2018   Cramp and spasm 07/20/2018   Lactic acidosis    SIRS (systemic inflammatory response syndrome) (HCC)    Acute renal failure (HCC)    Type 2 diabetes mellitus with stage 3b chronic kidney disease, without long-term current use of insulin (HCC)    Acute blood loss anemia    Sepsis (HCC) 07/17/2017   Hyponatremia 07/17/2017   Postoperative wound infection 07/17/2017   AKI (acute kidney injury) (HCC) 07/17/2017   Dehydration 07/17/2017   DM type 2 (diabetes mellitus, type 2) (HCC) 07/17/2017   Acute encephalopathy 07/17/2017   Degenerative spondylolisthesis 07/03/2017   Post-menopausal bleeding 06/20/2017   Chronic hepatitis C (HCC) 12/09/2015   Inclusion cyst 09/16/2014   Onychomycosis 08/15/2014   Type I (juvenile type) diabetes mellitus with renal manifestations, not stated as uncontrolled(250.41) 06/10/2014   Unspecified constipation 05/22/2014   Gastroesophageal reflux disease without esophagitis 05/22/2014   Pain in lower limb 01/29/2014   Eschar of multiple sites 07/31/2013   Porokeratosis 07/02/2013   Cyst of joint of ankle or foot 06/04/2013   History of endometrial ablation 02/06/2013   Pain in joint, ankle and foot 01/23/2013   Callus of foot 01/23/2013   Ingrown nail 01/23/2013   Deformity of metatarsal 01/23/2013    ONSET DATE: 08/31/2022 (referral date)  REFERRING DIAG: M48.062 (ICD-10-CM) - Spinal stenosis of lumbar region with neurogenic claudication  THERAPY DIAG:  Muscle weakness (generalized)  Other low back pain  Other abnormalities of gait and mobility  Acute pain of right knee  Acute pain of left knee  Rationale for Evaluation and Treatment: Rehabilitation  SUBJECTIVE:  SUBJECTIVE STATEMENT: "I've been doing the exercises from rehab a lot and not having too many issues.  I lose my balance occasionally, but use my rollator to prevent falling." Pt accompanied by: family member-sister dropped pt off (primary transportation)  PERTINENT HISTORY: GERD, DM II w/ retinopathy, Stage 3b CKD, HTN, right hearing loss, polysubstance abuse, hx of falls  "Presented to [facility] emergency department on 11/10 after reporting several falls and bilateral lower extremity weakness.  She is status post L4-5 and L5-S1 decompression and fusion surgery in 2018 and in 2021, respectively.   She also had C4-C7 ACDF in January 2023 by Dr. Jordan Likes.  Transferred to [facility]. Neurosurgery was consulted and MRI performed. She is followed by Dr. Dutch Quint who noted severe bilateral lower extremity weakness which left her nonambulatory.  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  She had failed conservative management including therapy and IV steroids.  She she was unable to stand or ambulate.  She was also reporting difficulty with bowel and bladder function.  She was taken to the operating room on 11/13 and underwent bilateral L3-4 decompressive laminectomies and foraminectomies, as well as posterior lumbar interbody fusion and arthrodesis."  PAIN:  Are you having pain? Yes: NPRS scale: 7/10 Pain location: low back and bilateral knees Pain description: sore, achy Aggravating factors: "I can't tell" Relieving factors: elevating legs  PRECAUTIONS: Back, Fall, and Other: TLSO-NO BENDING/LIFTING/TWISTING-PT to reach out to MD regarding wear time as not able to locate in chart, incision is healing but sensitive  WEIGHT BEARING RESTRICTIONS: No  FALLS: Has patient fallen in last 6 months? Yes. Number of falls 3; right leg was giving out when she was using a cane, and general imbalance  LIVING ENVIRONMENT: Lives with: lives alone Lives in:  House/apartment Stairs: No Has following equipment at home: Single point cane, Environmental consultant - 2 wheeled, Environmental consultant - 4 wheeled, and shower chair  PLOF: Independent with household mobility with device, Independent with community mobility with device, and Needs assistance with homemaking  PATIENT GOALS: "To get my balance back and be in control of my walking."  OBJECTIVE:   DIAGNOSTIC FINDINGS: Upcoming imaging of lumbar spine scheduled  COGNITION: Overall cognitive status: Within functional limits for tasks assessed   SENSATION: WFL  COORDINATION: LE RAMs:  WFL bilaterally Heel-to-shin  EDEMA:  None noted in BLE  MUSCLE TONE: None noted in BLE  POSTURE: rounded shoulders and forward head  LOWER EXTREMITY ROM:     Active  Right Eval Left Eval  Hip flexion WNL WNL  Hip extension    Hip abduction    Hip adduction    Hip internal rotation    Hip external rotation    Knee flexion    Knee extension " "  Ankle dorsiflexion " "  Ankle plantarflexion    Ankle inversion    Ankle eversion     (Blank rows = not tested)  LOWER EXTREMITY MMT:    MMT Right Eval Left Eval  Hip flexion 3+/5 3+/5  Hip extension    Hip abduction    Hip adduction    Hip internal rotation    Hip external rotation    Knee flexion    Knee extension 3+/5 3+/5  Ankle dorsiflexion 4-/5 4-/5  Ankle plantarflexion    Ankle inversion    Ankle eversion    (Blank rows = not tested)  BED MOBILITY:  Sit to supine Complete Independence Supine to sit Complete Independence  Rolling to Right Complete Independence Rolling to Left Complete Independence Pt is log rolling. TRANSFERS: Assistive device utilized: Environmental consultant - 4 wheeled  Sit to stand: Modified independence Stand to sit: Modified independence Chair to chair: Modified independence Floor: Total A-calls EMS  GAIT: Gait pattern: step through pattern, decreased arm swing- Right, decreased arm swing- Left, decreased step length- Right, decreased step  length- Left, decreased stride length, decreased hip/knee flexion- Right, decreased hip/knee flexion- Left, lateral hip instability, and decreased trunk rotation Distance walked: 462' + various other clinic distances Assistive device utilized: Environmental consultant - 4 wheeled Level of assistance: Modified independence Comments: Pt ambulates w/ mild left trendelenburg and reports mild cramping and tiredness in BLE.  FUNCTIONAL TESTS:  5 times sit to stand: 16.13 sec w/ hands on knees 6 minute walk test: 61' w/ rollator 10 meter walk test: TBD Berg Balance Scale: TBD  PATIENT SURVEYS:  None completed.  TODAY'S TREATMENT:                                                                                                                              DATE: N/A    PATIENT EDUCATION: Education details: PT POC, assessments used and to be used, and goals to be set. Person educated: Patient Education method: Explanation Education comprehension: verbalized understanding  HOME EXERCISE PROGRAM: Pt is doing squats and kickbacks at sink and walking.  Will established new HEP.  GOALS: Goals reviewed with patient? Yes  SHORT TERM GOALS: Target date: 10/07/2022  Pt will be independent with strength and balance HEP. Baseline:  To be established. Goal status: INITIAL  2.  Pt will report pain at rest of </= 5/10 in low back and legs to demonstrate subjective improvement in quality of life. Baseline: 7/10 Goal status: INITIAL  3.  Pt will decrease 5xSTS to </=13 seconds w/o UE support in order to demonstrate decreased risk for falls and improved functional bilateral LE strength and power. Baseline: 16.13 sec w/ hands on knees Goal status: INITIAL  4.  to be assessed w/ goal set as appropriate. Baseline: To be assessed. Goal status: INITIAL  5.  BERG to be assessed w/ goal set as appropriate. Baseline: To be assessed. Goal status: INITIAL  6.  DGI to be assessed at STG deadline if appropriate w/  LTG set. Baseline: To be determined. Goal status: INITIAL  LONG TERM GOALS: Target date: 11/04/2022  Pt will maintain walking program greater than or equal to 4 days a week to increase ambulatory tolerance and general aerobic capacity. Baseline: To be established. Goal status: INITIAL  2.  Pt will ambulate >/= 600 feet on using LRAD to demonstrate improved functional endurance for home and community participation. Baseline: 60' w/ rollator Goal status: INITIAL  3.  to be assessed w/ goal set as appropriate. Baseline: To be assessed. Goal status: INITIAL  4.  BERG to be assessed w/ goal set as appropriate. Baseline: To be assessed. Goal  status: INITIAL  5.  DGI goal if needed. Baseline: To be determined at STG deadline. Goal status: INITIAL  ASSESSMENT:  CLINICAL IMPRESSION: Patient is a 66 y.o. female who was seen today for physical therapy evaluation and treatment for lumbar stenosis s/p revision of decompression and fusion.  Pt has a significant PMH of bilateral L3-4 decompressive laminectomies and foraminectomies with posterior lumbar interbody fusion, HTN, DMII w/ retinopathy, Stage 3b CKD, polysubstance abuse, and falls.  Identified impairments include generalized LE weakness, reliance on rollator for safe independence, imbalance, low back and knee pain, and decreased activity tolerance.  Evaluation via the following assessment tools: 5xSTS indicates elevated fall risk.  Her ambulatory distance of 462' discontinuously during indicates lowered upright activity tolerance than baseline.  She would benefit from skilled PT to address impairments as noted and progress towards long term goals.  OBJECTIVE IMPAIRMENTS: Abnormal gait, decreased activity tolerance, decreased balance, decreased endurance, decreased strength, improper body mechanics, postural dysfunction, and pain.   ACTIVITY LIMITATIONS: carrying, lifting, bending, squatting, stairs, transfers, and  locomotion level  PARTICIPATION LIMITATIONS: meal prep, cleaning, laundry, driving, shopping, and community activity  PERSONAL FACTORS: Age, Fitness, Past/current experiences, Time since onset of injury/illness/exacerbation, and 1-2 comorbidities: HTN, CKD  are also affecting patient's functional outcome.   REHAB POTENTIAL: Good  CLINICAL DECISION MAKING: Evolving/moderate complexity  EVALUATION COMPLEXITY: Moderate  PLAN:  PT FREQUENCY: 1x/week (will likely inc to 2x/week at re-cert as pt comes out of brace)  PT DURATION: 8 weeks  PLANNED INTERVENTIONS: Therapeutic exercises, Therapeutic activity, Neuromuscular re-education, Balance training, Gait training, Patient/Family education, Self Care, Stair training, Vestibular training, Orthotic/Fit training, DME instructions, scar mobilization, Manual therapy, and Re-evaluation  PLAN FOR NEXT SESSION: , BERG-set goals, initiate HEP for LE strength and balance.   Sadie Haber, PT, DPT 09/08/2022, 4:50 PM

## 2022-09-09 ENCOUNTER — Other Ambulatory Visit: Payer: Self-pay | Admitting: Family

## 2022-09-09 MED ORDER — ACCU-CHEK GUIDE VI STRP: ORAL_STRIP | 2 refills | Status: DC

## 2022-09-09 NOTE — Telephone Encounter (Signed)
Requested Prescriptions  Pending Prescriptions Disp Refills   glucose blood (ACCU-CHEK GUIDE) test strip 300 strip 2    Sig: Use as directed to check blood sugars 2 times per day dx: e11.69     Endocrinology: Diabetes - Testing Supplies Passed - 09/09/2022  2:16 PM      Passed - Valid encounter within last 12 months    Recent Outpatient Visits           3 weeks ago Acute right-sided low back pain, unspecified whether sciatica present   Primary Care at [facility], Amy J, NP   3 months ago Chronic abdominal pain   Primary Care at [facility], Amy J, NP   6 months ago Type 2 diabetes mellitus with stage 3b chronic kidney disease, with long-term current use of insulin [facility])   Primary Care at [facility], Kasandra Knudsen, PA-C   8 months ago Annual physical exam   Primary Care at [facility], Amy J, NP   9 months ago Encounter for completion of form with patient   Primary Care at [facility], Salomon Fick, NP       Future Appointments             In 6 days Rema Fendt, NP Primary Care at [facility]

## 2022-09-09 NOTE — Progress Notes (Unsigned)
TRANSITION OF CARE VISIT   Primary Care Provider (PCP):         Ricky Stabs, NP                                                          [facility] at [facility] [address]       [place],  [place]  [address]        Phone: [phone]      Date of Admission: 08/24/2022  Date of Discharge: 09/01/2022  Transitions of Care Call: 09/05/2022  Discharged from: [facility]   Discharge Diagnosis:  Principal Problem:   Spinal stenosis, lumbar Active problems: Debility secondary to lumbar stenosis   Summary of Admission per MD note: Hospital Course: Kara Kelley was admitted to rehab 08/24/2022 for inpatient therapies to consist of PT, ST and OT at least three hours five days a week. Past admission physiatrist, therapy team and rehab RN have worked together to provide customized collaborative inpatient rehab. Noted to have mild leukocytosis, confusion. Started on Keflex for 5 days on 11/17 for uncomplicated UTI. Surgical dressing removed with appearance of good healing. IVFs were discontinued on 11/18. Complained of abdominal and RLE pain on 11/18. Abdominal films negative.  Follow-up labs with normal WBC, BUN and Cr. Hemoglobin trending down and 7.7 on 11/20.   Blood pressures were monitored on TID basis and remained stable on lisinopril 10 mg.   Diabetes has been monitored with ac/hs CBG checks and SSI was use prn for tighter BS control. Levemir 15 units BID continued. Added 5 units of Novolog at discharge for meal coverage.   Rehab course: During patient's stay in rehab weekly team conferences were held to monitor patient's progress, set goals and discuss barriers to discharge. At admission, patient required mod assist with basic self-care skills, min assist with mobility.   She has had improvement in activity tolerance, balance, postural control as well as ability to compensate for deficits.  She has had improvement in functional use RUE/LUE  and RLE/LLE as well as improvement in awareness. 11/22:  Pt started to stand up to RW prior to donning TLSO requiring cuing to remember to don it first. Sit<>stands using RW mod-I during session.  Gait ~160ft to main therapy gym using RW mod-I - pt demos decreased B LE foot clearance during swing and slower but adequate gait speed - required 1x standing rest break due to pt feeling SOB, monitored SpO2 and HR to be WNL during.     Special Instructions: No driving, alcohol consumption or tobacco use.   Follow-up with orthopedic surgeon regarding knee pain.   Rec: carb counting and fingerstick glucose testing 4 times daily and follow-up with PCP.  Follow-Ups Follow up with Genice Rouge, MD (Physical Medicine and Rehabilitation); office will call you to arrange your appt (sent) Follow up with Julio Sicks, MD (Neurosurgery); Call the office in 1-2 days to make arrangements for follow-up appointment. Schedule an appointment with Shamleffer, Konrad Dolores, MD (Endocrinology); Reschedule appointment. Follow-up thyroid issues ?  resume methimazole ?  Today's visit 09/14/2022: Presents for hospital discharge follow-up. She is accompanied by her daughter. Stable since hospital discharge.  Has not scheduled follow-ups with Neurosurgery, Physical Medicine/Rehab, OT, and Endocrinology as of yet. She does have appointments scheduled  with PT.  Doing well on blood pressure and diabetes medications. Needs refills on Ozempic. Reports she has an upcoming appointment with orthopedist for knee pain. Reports urine cloudy with odor. No further issues/concerns.   Patient/Caregiver self-reported problems/concerns: see above  MEDICATIONS  Medication Reconciliation conducted with patient/caregiver? (Yes/ No): yes  New medications prescribed/discontinued upon discharge? (Yes/No): yes   Barriers identified related to medications: no   LABS  Lab Reviewed  (Yes/No/NA): yes  PHYSICAL EXAM:   Today's Vitals   09/15/22 1008 09/15/22 1027  BP: (!) 146/88 (!) 147/84  Pulse: 87   Resp: 16   Temp: 98.3 F (36.8 C)   SpO2: 93%   Weight: 183 lb (83 kg)   Height: 4' 11.02" (1.499 m)   PainSc: 0-No pain    Body mass index is 36.94 kg/m.  Physical Exam HENT:     Head: Normocephalic and atraumatic.  Eyes:     Extraocular Movements: Extraocular movements intact.     Conjunctiva/sclera: Conjunctivae normal.     Pupils: Pupils are equal, round, and reactive to light.  Cardiovascular:     Rate and Rhythm: Normal rate and regular rhythm.     Pulses: Normal pulses.     Heart sounds: Normal heart sounds.  Pulmonary:     Effort: Pulmonary effort is normal.     Breath sounds: Normal breath sounds.  Musculoskeletal:     Cervical back: Normal range of motion and neck supple.     Comments: Lumbar brace in place.  Neurological:     General: No focal deficit present.     Mental Status: She is alert and oriented to person, place, and time.  Psychiatric:        Mood and Affect: Mood normal.        Behavior: Behavior normal.    ASSESSMENT AND PLAN: 1. Hospital discharge follow-up - Reviewed hospital course, current medications, ensured proper follow-up in place, and addressed concerns.   2. Spinal stenosis of lumbar region, unspecified whether neurogenic claudication present 3. Debility - Keep all scheduled appointments with Physical Therapy.  - Referral to Neurosurgery, Physical Medicine Rehab, and Occupational Therapy for further evaluation/management.  - Ambulatory referral to Physical Medicine Rehab - Ambulatory referral to Occupational Therapy - Ambulatory referral to Neurosurgery  4. Hyperthyroidism - Referral to Endocrinology for further evaluation/management. - Ambulatory referral to Endocrinology  5. Type 2 diabetes mellitus with stage 3b chronic kidney disease, with long-term current use of insulin (HCC) - Hemoglobin A1c 7.4%  on 08/20/2022. - Continue Levemir and Novolog as prescribed. No refills needed as of present.  - Continue Semaglutide as prescribed.  - Discussed the importance of healthy eating habits, low-carbohydrate diet, low-sugar diet, regular aerobic exercise (at least 150 minutes a week as tolerated) and medication compliance to achieve or maintain control of diabetes. - Referral to Endocrinology for further evaluation/management.  - Ambulatory referral to Endocrinology - Semaglutide, 1 MG/DOSE, (OZEMPIC, 1 MG/DOSE,) 4 MG/3ML SOPN; Inject 1 mg into the skin once a week.  Dispense: 9 mL; Refill: 0 - Insulin Pen Needle (PEN NEEDLES) 31G X 8 MM MISC; UAD  Dispense: 100 each; Refill: 0  6. Primary hypertension - Blood pressure relative to goal. - Continue Lisinopril as prescribed.  - Counseled on blood pressure goal of less than 130/80, low-sodium, DASH diet, medication compliance, and 150 minutes of moderate intensity exercise per week as tolerated. Counseled on medication adherence and adverse effects. - Follow-up with primary provider in 3 months or sooner if  needed.   7. Hyperlipidemia, unspecified hyperlipidemia type - Continue Atorvastatin as prescribed.  - Follow-up with primary provider as scheduled.   8. Lower urinary tract symptoms - Routine screening.  - POCT URINALYSIS DIP (CLINITEK); Future - Cervicovaginal ancillary only   PATIENT EDUCATION PROVIDED: See AVS   FOLLOW-UP (Include any further testing or referrals):  - Keep all scheduled appointments with PT.  - Referral to Neurosurgery.  - Referral to Physical Medicine Rehab.  - Referral to OT.  - Referral to Endocrinology.  - Keep all scheduled appointments with Orthopedics.  - Follow-up with primary provider as scheduled.   Patient was given clear instructions to go to Emergency Department or return to medical center if symptoms don't improve, worsen, or new problems develop.The patient verbalized understanding.

## 2022-09-09 NOTE — Telephone Encounter (Signed)
Copied from CRM [id number]. Topic: General - Other >> Sep 09, 2022 11:40 AM Everette C wrote: Reason for CRM: Medication Refill - Medication: glucose blood (ACCU-CHEK GUIDE) test strip [[id number]]  Has the patient contacted their pharmacy? Yes.  The patient has been directed to contact their PCP  (Agent: If no, request that the patient contact the pharmacy for the refill. If patient does not wish to contact the pharmacy document the reason why and proceed with request.) (Agent: If yes, when and what did the pharmacy advise?)  Preferred Pharmacy (with phone number or street name): [facility] [phone] - [place], [place] - [address] AT [facility] [address] [place] [address] Phone: [phone] Fax: [phone] Hours: Not open 24 hours   Has the patient been seen for an appointment in the last year OR does the patient have an upcoming appointment? Yes.    Agent: Please be advised that RX refills may take up to 3 business days. We ask that you follow-up with your pharmacy.

## 2022-09-15 ENCOUNTER — Encounter: Payer: Self-pay | Admitting: Family

## 2022-09-15 ENCOUNTER — Ambulatory Visit (INDEPENDENT_AMBULATORY_CARE_PROVIDER_SITE_OTHER): Payer: Medicaid Other | Admitting: Family

## 2022-09-15 ENCOUNTER — Other Ambulatory Visit (HOSPITAL_COMMUNITY)
Admission: RE | Admit: 2022-09-15 | Discharge: 2022-09-15 | Disposition: A | Discharge status: Home / Self Care | Payer: Medicaid Other | Source: Ambulatory Visit | Attending: Family | Admitting: Family

## 2022-09-15 ENCOUNTER — Other Ambulatory Visit: Payer: Self-pay | Admitting: Family Medicine

## 2022-09-15 VITALS — BP 147/84 | HR 87 | Temp 98.3°F | Resp 16 | Ht 59.02 in | Wt 183.0 lb

## 2022-09-15 DIAGNOSIS — Z794 Long term (current) use of insulin: Secondary | ICD-10-CM

## 2022-09-15 DIAGNOSIS — E0590 Thyrotoxicosis, unspecified without thyrotoxic crisis or storm: Secondary | ICD-10-CM | POA: Diagnosis not present

## 2022-09-15 DIAGNOSIS — Z09 Encounter for follow-up examination after completed treatment for conditions other than malignant neoplasm: Secondary | ICD-10-CM

## 2022-09-15 DIAGNOSIS — R5381 Other malaise: Secondary | ICD-10-CM

## 2022-09-15 DIAGNOSIS — E1122 Type 2 diabetes mellitus with diabetic chronic kidney disease: Secondary | ICD-10-CM

## 2022-09-15 DIAGNOSIS — E785 Hyperlipidemia, unspecified: Secondary | ICD-10-CM

## 2022-09-15 DIAGNOSIS — M48061 Spinal stenosis, lumbar region without neurogenic claudication: Secondary | ICD-10-CM

## 2022-09-15 DIAGNOSIS — R399 Unspecified symptoms and signs involving the genitourinary system: Secondary | ICD-10-CM

## 2022-09-15 DIAGNOSIS — N1832 Chronic kidney disease, stage 3b: Secondary | ICD-10-CM

## 2022-09-15 DIAGNOSIS — E1165 Type 2 diabetes mellitus with hyperglycemia: Secondary | ICD-10-CM

## 2022-09-15 DIAGNOSIS — I10 Essential (primary) hypertension: Secondary | ICD-10-CM

## 2022-09-15 MED ORDER — OZEMPIC (1 MG/DOSE) 4 MG/3ML ~~LOC~~ SOPN: 1.0000 mg | PEN_INJECTOR | SUBCUTANEOUS | 0 refills | Status: DC

## 2022-09-15 MED ORDER — PEN NEEDLES 31G X 8 MM MISC: 0 refills | Status: DC

## 2022-09-15 NOTE — Telephone Encounter (Signed)
Unable to refill per protocol, last refill by provider 09/05/22 for 15 mL. Will refuse duplicate request.  Requested Prescriptions  Pending Prescriptions Disp Refills   LEVEMIR FLEXPEN 100 UNIT/ML FlexPen [Pharmacy Med Name: LEVEMIR FLEXPEN INJECTION 3ML] 15 mL 0    Sig: ADMINISTER 15 UNITS UNDER THE SKIN TWICE DAILY     Endocrinology:  Diabetes - Insulins Passed - 09/15/2022  8:05 AM      Passed - HBA1C is between 0 and 7.9 and within 180 days    Hgb A1c MFr Bld  Date Value Ref Range Status  08/20/2022 7.4 (H) 4.8 - 5.6 % Final    Comment:    (NOTE) Pre diabetes:          5.7%-6.4%  Diabetes:              >6.4%  Glycemic control for   <7.0% adults with diabetes          Passed - Valid encounter within last 6 months    Recent Outpatient Visits           Today Hospital discharge follow-up   Primary Care at [facility] Amy J, NP   3 weeks ago Acute right-sided low back pain, unspecified whether sciatica present   Primary Care at [facility], Amy J, NP   4 months ago Chronic abdominal pain   Primary Care at [facility], Amy J, NP   7 months ago Type 2 diabetes mellitus with stage 3b chronic kidney disease, with long-term current use of insulin [facility])   Primary Care at [facility], Kasandra Knudsen, PA-C   8 months ago Annual physical exam   Primary Care at [facility], Salomon Fick, NP

## 2022-09-15 NOTE — Progress Notes (Signed)
.  Pt presents for transition of care   -needs refill on Ozempic

## 2022-09-16 ENCOUNTER — Ambulatory Visit: Payer: Medicaid Other | Attending: Physician Assistant | Admitting: Physical Therapy

## 2022-09-16 ENCOUNTER — Other Ambulatory Visit: Payer: Self-pay | Admitting: Family

## 2022-09-16 DIAGNOSIS — M6281 Muscle weakness (generalized): Secondary | ICD-10-CM | POA: Insufficient documentation

## 2022-09-16 DIAGNOSIS — B9689 Other specified bacterial agents as the cause of diseases classified elsewhere: Secondary | ICD-10-CM

## 2022-09-16 DIAGNOSIS — R2689 Other abnormalities of gait and mobility: Secondary | ICD-10-CM | POA: Diagnosis present

## 2022-09-16 DIAGNOSIS — M48062 Spinal stenosis, lumbar region with neurogenic claudication: Secondary | ICD-10-CM | POA: Insufficient documentation

## 2022-09-16 DIAGNOSIS — B3731 Acute candidiasis of vulva and vagina: Secondary | ICD-10-CM | POA: Insufficient documentation

## 2022-09-16 DIAGNOSIS — M5459 Other low back pain: Secondary | ICD-10-CM | POA: Diagnosis present

## 2022-09-16 DIAGNOSIS — M25561 Pain in right knee: Secondary | ICD-10-CM | POA: Insufficient documentation

## 2022-09-16 DIAGNOSIS — M25562 Pain in left knee: Secondary | ICD-10-CM | POA: Insufficient documentation

## 2022-09-16 HISTORY — DX: Acute candidiasis of vulva and vagina: B37.31

## 2022-09-16 LAB — CERVICOVAGINAL ANCILLARY ONLY
Bacterial Vaginitis (gardnerella): POSITIVE — AB
Candida Glabrata: NEGATIVE
Candida Vaginitis: POSITIVE — AB
Chlamydia: NEGATIVE
Comment: NEGATIVE
Comment: NEGATIVE
Comment: NEGATIVE
Comment: NEGATIVE
Comment: NEGATIVE
Comment: NORMAL
Neisseria Gonorrhea: NEGATIVE
Trichomonas: NEGATIVE

## 2022-09-16 MED ORDER — FLUCONAZOLE 150 MG PO TABS: 150.0000 mg | ORAL_TABLET | Freq: Once | ORAL | 0 refills | Status: AC

## 2022-09-16 MED ORDER — METRONIDAZOLE 500 MG PO TABS: 500.0000 mg | ORAL_TABLET | Freq: Two times a day (BID) | ORAL | 0 refills | Status: AC

## 2022-09-16 NOTE — Therapy (Signed)
OUTPATIENT PHYSICAL THERAPY NEURO TREATMENT   Patient Name: Kara Kelley MRN: [id number] DOB:11-30-1955, 66 y.o., female Today's Date: 09/16/2022   PCP: Rema FendtStephens, Amy J, NP REFERRING PROVIDER: Milinda AntisSetzer, Sandra J, PA-C  END OF SESSION:  PT End of Session - 09/16/22 1103     Visit Number 2    Number of Visits 9   8+eval   Date for PT Re-Evaluation 11/11/22   pushed out due to delay in scheduling   Authorization Type Traditional Medicaid    PT Start Time 1100    PT Stop Time 1142    PT Time Calculation (min) 42 min    Equipment Utilized During Treatment Other (comment);Gait belt   TLSO   Activity Tolerance Patient tolerated treatment well    Behavior During Therapy WFL for tasks assessed/performed              Past Medical History:  Diagnosis Date   Anemia    Anxiety    Arthritis    Asthma    Bunion    Callus    Chronic pain    Cocaine abuse (HCC)    COPD (chronic obstructive pulmonary disease) (HCC)    Corns and callosities    Degenerative joint disease    Depression    Diabetes mellitus    Endometrial polyp    ETOH abuse    Gall stones    GERD (gastroesophageal reflux disease)    Headache    history of Migraines   Hepatitis C    Hep C   Hyperlipidemia    Hypertension    Insomnia    Spondylolisthesis of lumbar region    Substance abuse (HCC)    alcoholism   Tuberculosis 1985   Wears dentures    Wears glasses    Past Surgical History:  Procedure Laterality Date   ANTERIOR CERVICAL DECOMP/DISCECTOMY FUSION N/A 10/26/2021   Procedure: ACDF - C4-C5 - C5-C6 - C6-C7;  Surgeon: Julio SicksPool, Henry, MD;  Location: [facility] OR;  Service: Neurosurgery;  Laterality: N/A;   BACK SURGERY  2018   CESAREAN SECTION     x3   CHOLECYSTECTOMY N/A 08/11/2016   Procedure: LAPAROSCOPIC CHOLECYSTECTOMY WITH   INTRAOPERATIVE CHOLANGIOGRAM;  Surgeon: Almond LintFaera Byerly, MD;  Location: [facility] ORS;  Service: General;  Laterality: N/A;   COLONOSCOPY     Cotton Osteotomy w/ Graft Left 06/18/2009    Excision of Benign Lesion Right 01/30/2013   Rt Plantar   FOOT SURGERY     HAMMER TOE REPAIR Right 02/12/2016   RIGHT #5   Hammertoe Repair Left 06/18/2009   Lt #5   HYSTEROSCOPY N/A 06/20/2017   Procedure: DILATION AND CURETTAGE, HYSTEROSCOPY w/ Polypectomy;  Surgeon: Willodean RosenthalHarraway-Smith, Carolyn, MD;  Location: [facility] ORS;  Service: Gynecology;  Laterality: N/A;   LUMBAR FUSION  2018   METATARSAL OSTEOTOMY Left 06/18/2009   #5   MULTIPLE TOOTH EXTRACTIONS     Nail Matrixectomy Left 06/18/2009   LT #1   OSTEOTOMY Right 01/30/2013   Rt #5   Phalangectomy Left 06/18/2009   LT #1   Phalangectomy Right 01/30/2013   Rt #1   TUBAL LIGATION     Patient Active Problem List   Diagnosis Date Noted   Spinal stenosis, lumbar 08/24/2022   Falls, initial encounter 08/20/2022   Frequent falls 08/20/2022   Leg weakness 08/20/2022   Lumbar stenosis 08/20/2022   Microcytic hypochromic anemia 08/20/2022   Bacterial vaginosis 05/16/2022   Cholelithiasis without obstruction 04/11/2022   Chronic idiopathic constipation 04/11/2022  Gallbladder calculus with acute cholecystitis and no obstruction 04/11/2022   History of adenomatous polyp of colon 04/11/2022   Morbid obesity (HCC) 04/11/2022   Polysubstance abuse (HCC) 04/11/2022   Right lower quadrant pain 04/11/2022   Type 2 diabetes mellitus (HCC) 04/11/2022   Type II diabetes mellitus (HCC) 04/11/2022   Pharyngoesophageal dysphagia 04/05/2022   Hyperthyroidism 12/29/2021   Cervical spondylosis with myelopathy and radiculopathy 10/26/2021   Bilateral impacted cerumen 09/16/2020   Itching of ear 09/16/2020   Heartburn 07/10/2020   Lumbar radiculopathy 04/03/2020   Hearing loss of right ear 03/12/2020   Plantar flexed metatarsal bone of left foot 08/14/2019   Skin burn 07/09/2019   Uncontrolled type 2 diabetes mellitus with hyperglycemia (HCC) 06/19/2019   Hypertensive retinopathy of both eyes 03/06/2019   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) 03/06/2019   Essential hypertension 10/05/2018   Cramp and spasm 07/20/2018   Lactic acidosis    SIRS (systemic inflammatory response syndrome) (HCC)    Acute renal failure (HCC)    Type 2 diabetes mellitus with stage 3b chronic kidney disease, without long-term current use of insulin (HCC)    Acute blood loss anemia    Sepsis (HCC) 07/17/2017   Hyponatremia 07/17/2017   Postoperative wound infection 07/17/2017   AKI (acute kidney injury) (HCC) 07/17/2017   Dehydration 07/17/2017   DM type 2 (diabetes mellitus, type 2) (HCC) 07/17/2017   Acute encephalopathy 07/17/2017   Degenerative spondylolisthesis 07/03/2017   Post-menopausal bleeding 06/20/2017   Chronic hepatitis C (HCC) 12/09/2015   Inclusion cyst 09/16/2014   Onychomycosis 08/15/2014   Type I (juvenile type) diabetes mellitus with renal manifestations, not stated as uncontrolled(250.41) 06/10/2014   Unspecified constipation 05/22/2014   Gastroesophageal reflux disease without esophagitis 05/22/2014   Pain in lower limb 01/29/2014   Eschar of multiple sites 07/31/2013   Porokeratosis 07/02/2013   Cyst of joint of ankle or foot 06/04/2013   History of endometrial ablation 02/06/2013   Pain in joint, ankle and foot 01/23/2013   Callus of foot 01/23/2013   Ingrown nail 01/23/2013   Deformity of metatarsal 01/23/2013    ONSET DATE: 08/31/2022 (referral date)  REFERRING DIAG: M48.062 (ICD-10-CM) - Spinal stenosis of lumbar region with neurogenic claudication  THERAPY DIAG:  Muscle weakness (generalized)  Other low back pain  Other abnormalities of gait and mobility  Acute pain of right knee  Acute pain of left knee  Spinal stenosis of lumbar region with neurogenic claudication  Rationale for Evaluation and Treatment: Rehabilitation  SUBJECTIVE:  SUBJECTIVE STATEMENT: Pt reports that today is a "bad day" pain-wise and expresses frustration with her pain management. Pt reports she had her meds sorted prior to being admitted to the hospital where they changed everything. Pt reports she saw her PCP earlier this week and mentioned her frustration with her current medication regimen but pt unsure if her meds are going to be changed. Pt encouraged to follow-up with her PCP regarding her medications. Pt also unable to recall all follow-up appointments that are supposed to be made, printed out follow-up list from her doctor appointment yesterday.  Pt accompanied by: family member-daughter dropped pt off (sister is primary transportation)  PERTINENT HISTORY: GERD, DM II w/ retinopathy, Stage 3b CKD, HTN, right hearing loss, polysubstance abuse, hx of falls  "Presented to [facility] emergency department on 11/10 after reporting several falls and bilateral lower extremity weakness.  She is status post L4-5 and L5-S1 decompression and fusion surgery in 2018 and in 2021, respectively.   She also had C4-C7 ACDF in January 2023 by Dr. Jordan Likes.  Transferred to [facility]. Neurosurgery was consulted and MRI performed. She is followed by Dr. Dutch Quint who noted severe bilateral lower extremity weakness which left her nonambulatory.  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  She had failed conservative management including therapy and IV steroids.  She she was unable to stand or ambulate.  She was also reporting difficulty with bowel and bladder function.  She was taken to the operating room on 11/13 and underwent bilateral L3-4 decompressive laminectomies and foraminectomies, as well as posterior lumbar interbody fusion and arthrodesis."  PAIN:  Are you having pain? Yes: NPRS scale: 9/10 Pain location: low back and bilateral knees Pain description: sore, achy Aggravating  factors: "I can't tell" Relieving factors: elevating legs  PRECAUTIONS: Back, Fall, and Other: TLSO-NO BENDING/LIFTING/TWISTING-PT to reach out to MD regarding wear time as not able to locate in chart, incision is healing but sensitive  WEIGHT BEARING RESTRICTIONS: No  FALLS: Has patient fallen in last 6 months? Yes. Number of falls 3; right leg was giving out when she was using a cane, and general imbalance  LIVING ENVIRONMENT: Lives with: lives alone Lives in: House/apartment Stairs: No Has following equipment at home: Single point cane, Environmental consultant - 2 wheeled, Environmental consultant - 4 wheeled, and shower chair  PLOF: Independent with household mobility with device, Independent with community mobility with device, and Needs assistance with homemaking  PATIENT GOALS: "To get my balance back and be in control of my walking."  OBJECTIVE:   DIAGNOSTIC FINDINGS: Upcoming imaging of lumbar spine scheduled   TODAY'S TREATMENT:                                                                                                                               THER ACT:  [facility] PT Assessment - 09/16/22 1115       Ambulation/Gait   Gait velocity  32.8 ft over 19.53 sec = 1.68 ft/sec      Standardized Balance Assessment   Standardized Balance Assessment Berg Balance Test;Dynamic Gait Index      Berg Balance Test   Sit to Stand Able to stand without using hands and stabilize independently    Standing Unsupported Able to stand safely 2 minutes    Sitting with Back Unsupported but Feet Supported on Floor or Stool Able to sit safely and securely 2 minutes    Stand to Sit Sits safely with minimal use of hands    Transfers Able to transfer safely, minor use of hands    Standing Unsupported with Eyes Closed Able to stand 10 seconds with supervision    Standing Unsupported with Feet Together Able to place feet together independently and stand for 1 minute with supervision    From Standing, Reach Forward with  Outstretched Arm Can reach forward >5 cm safely (2")    From Standing Position, Pick up Object from Floor Unable to try/needs assist to keep balance    From Standing Position, Turn to Look Behind Over each Shoulder Turn sideways only but maintains balance   spinal precautions, unable to twist   Turn 360 Degrees Able to turn 360 degrees safely but slowly    Standing Unsupported, Alternately Place Feet on Step/Stool Able to complete >2 steps/needs minimal assist    Standing Unsupported, One Foot in Front Able to take small step independently and hold 30 seconds    Standing on One Leg Tries to lift leg/unable to hold 3 seconds but remains standing independently    Total Score 36    Berg comment: 36/56, high fall risk      Dynamic Gait Index   Level Surface Mild Impairment    Change in Gait Speed Moderate Impairment    Gait with Horizontal Head Turns Mild Impairment    Gait with Vertical Head Turns Mild Impairment    Gait and Pivot Turn Mild Impairment    Step Over Obstacle Moderate Impairment    Step Around Obstacles Severe Impairment    Steps Moderate Impairment    Total Score 11             PATIENT EDUCATION: Education details: OM results, continue HEP from hospital and will add to it next visit Person educated: Patient Education method: Explanation Education comprehension: verbalized understanding  HOME EXERCISE PROGRAM: Pt is doing squats and kickbacks at sink and walking.  Will establish new HEP.   GOALS: Goals reviewed with patient? Yes  SHORT TERM GOALS: Target date: 10/07/2022  Pt will be independent with strength and balance HEP. Baseline:  To be established. Goal status: INITIAL  2.  Pt will report pain at rest of </= 5/10 in low back and legs to demonstrate subjective improvement in quality of life. Baseline: 7/10 Goal status: INITIAL  3.  Pt will decrease 5xSTS to </=13 seconds w/o UE support in order to demonstrate decreased risk for falls and improved  functional bilateral LE strength and power. Baseline: 16.13 sec w/ hands on knees Goal status: INITIAL  4.  Pt will improve gait velocity to at least 2.0 ft/sec for improved gait efficiency and performance at mod I level  Baseline: 1.68 ft/sec (12/8) Goal status: INITIAL  5.  Pt will improve Berg score to 41/56 for decreased fall risk Baseline: 36/56 (12/8) Goal status: INITIAL  6.  DGI to be assessed at STG deadline if appropriate w/ LTG set. Baseline: To be determined. Goal status:  INITIAL  LONG TERM GOALS: Target date: 11/04/2022  Pt will maintain walking program greater than or equal to 4 days a week to increase ambulatory tolerance and general aerobic capacity. Baseline: To be established. Goal status: INITIAL  2.  Pt will ambulate >/= 600 feet on using LRAD to demonstrate improved functional endurance for home and community participation. Baseline: 53' w/ rollator Goal status: INITIAL  3. Pt will improve gait velocity to at least 2.5 ft/sec for improved gait efficiency and performance at mod I level  Baseline: 1.68 ft/sec (12/8) Goal status: INITIAL  4.  Pt will improve Berg score to 47/56 for decreased fall risk Baseline: 36/56 (12/8) Goal status: INITIAL  5.  Pt will improve DGI to 19/24 for decreased fall risk  Baseline: 11/24 (12/8) Goal status: INITIAL  ASSESSMENT:  CLINICAL IMPRESSION: Emphasis of skilled PT session on performing BBS, DGA, and gait speed assessments in order to set STG and LTG. Patient demonstrates increased fall risk as noted by score of 36/56 on Berg Balance Scale.  (<36= high risk for falls, close to 100%; 37-45 significant >80%; 46-51 moderate >50%; 52-55 lower >25%). Pt also indicates a high fall risk based on gait speed of 1.68 ft/sec and DGI score of 11/24. Pt limited by 9/10 pain in her low back and BLE this date as well as decreased endurance, continues to benefit from skilled therapy services. Continue POC.   OBJECTIVE  IMPAIRMENTS: Abnormal gait, decreased activity tolerance, decreased balance, decreased endurance, decreased strength, improper body mechanics, postural dysfunction, and pain.   ACTIVITY LIMITATIONS: carrying, lifting, bending, squatting, stairs, transfers, and locomotion level  PARTICIPATION LIMITATIONS: meal prep, cleaning, laundry, driving, shopping, and community activity  PERSONAL FACTORS: Age, Fitness, Past/current experiences, Time since onset of injury/illness/exacerbation, and 1-2 comorbidities: HTN, CKD  are also affecting patient's functional outcome.   REHAB POTENTIAL: Good  CLINICAL DECISION MAKING: Evolving/moderate complexity  EVALUATION COMPLEXITY: Moderate  PLAN:  PT FREQUENCY: 1x/week (will likely inc to 2x/week at re-cert as pt comes out of brace)  PT DURATION: 8 weeks  PLANNED INTERVENTIONS: Therapeutic exercises, Therapeutic activity, Neuromuscular re-education, Balance training, Gait training, Patient/Family education, Self Care, Stair training, Vestibular training, Orthotic/Fit training, DME instructions, scar mobilization, Manual therapy, and Re-evaluation  PLAN FOR NEXT SESSION: initiate HEP for LE strength and balance (step-taps, tandem stance, SLS)   Peter Congo, PT, DPT, CSRS 09/16/2022, 11:42 AM

## 2022-09-19 ENCOUNTER — Ambulatory Visit: Payer: Self-pay | Admitting: *Deleted

## 2022-09-19 NOTE — Telephone Encounter (Signed)
Seems discontinued because of being a historical medication. Last time prescribed in 2018.

## 2022-09-19 NOTE — Telephone Encounter (Signed)
Message from Kara Kelley sent at 09/19/2022  4:25 PM EST  Summary: medication change   The patient has been in contact with their neurosurgeon and shares that they no longer need a new medication          No contact with patient on this encounter. Message left from patient .  Reason for Disposition  NON-URGENT call redirected to PCP's office because it is open  Answer Assessment - Initial Assessment Questions N/A Message left from patient . No contact made with patient at this time.  Protocols used: No Contact or Duplicate Contact Call-A-AH

## 2022-09-19 NOTE — Telephone Encounter (Addendum)
Summary: medicatuin change   Pt states pregablin is not working  Pt requesting to be prescribed gabapentin (NEURONTIN) 100 MG capsule    Chief Complaint: requesting medication change from Neurontin to pregabalin Symptoms: reports Neurontin not helping manage pain to knees, legs, back. Non stop aches and can not sleep. Reports taking pregabalin prior to Neurontin but Neurontin not effective. Frequency: na Pertinent Negatives: Patient denies na Disposition: [] ED /[] Urgent Care (no appt availability in office) / [] Appointment(In office/virtual)/ []  [place] [facility]/ [] Home Care/ [] Refused Recommended Disposition /[] [facility]/ [x]  Follow-up with PCP Additional Notes:    Please advise if medication can be changed without appt.           Reason for Disposition  Prescription request for new medicine (not a refill)  Answer Assessment - Initial Assessment Questions 1. NAME of MEDICINE: "What medicine(s) are you calling about?"     Pregabalin and Neurontin 100 mg  2. QUESTION: "What is your question?" (e.g., double dose of medicine, side effect)     Can PCP switch neuontin to pregabalin ? 3. PRESCRIBER: "Who prescribed the medicine?" Reason: if prescribed by specialist, call should be referred to that group.     PCP 4. SYMPTOMS: "Do you have any symptoms?" If Yes, ask: "What symptoms are you having?"  "How bad are the symptoms (e.g., mild, moderate, severe)     Neurontin not helping manage pain in knees, legs and back.  5. PREGNANCY:  "Is there any chance that you are pregnant?" "When was your last menstrual period?"     na  Protocols used: Medication Question Call-A-AH

## 2022-09-23 ENCOUNTER — Ambulatory Visit: Payer: Medicaid Other | Admitting: Physical Therapy

## 2022-09-30 ENCOUNTER — Ambulatory Visit: Payer: Medicaid Other | Admitting: Physical Therapy

## 2022-09-30 DIAGNOSIS — M25561 Pain in right knee: Secondary | ICD-10-CM

## 2022-09-30 DIAGNOSIS — M5459 Other low back pain: Secondary | ICD-10-CM

## 2022-09-30 DIAGNOSIS — M6281 Muscle weakness (generalized): Secondary | ICD-10-CM | POA: Diagnosis not present

## 2022-09-30 DIAGNOSIS — R2689 Other abnormalities of gait and mobility: Secondary | ICD-10-CM

## 2022-09-30 DIAGNOSIS — M25562 Pain in left knee: Secondary | ICD-10-CM

## 2022-09-30 DIAGNOSIS — M48062 Spinal stenosis, lumbar region with neurogenic claudication: Secondary | ICD-10-CM

## 2022-09-30 NOTE — Therapy (Signed)
OUTPATIENT PHYSICAL THERAPY NEURO TREATMENT   Patient Name: Kara Kelley MRN: [id number] DOB:[birth year], 66 y.o., female Today's Date: 09/30/2022   PCP: Camillia Herter, NP REFERRING PROVIDER: Barbie Banner, PA-C  END OF SESSION:  PT End of Session - 09/30/22 1228     Visit Number 3    Number of Visits 9   8+eval   Date for PT Re-Evaluation 11/11/22   pushed out due to delay in scheduling   Authorization Type Traditional Medicaid    PT Start Time 1228    PT Stop Time 1310    PT Time Calculation (min) 42 min    Equipment Utilized During Treatment Other (comment);Gait belt   TLSO   Activity Tolerance Patient tolerated treatment well    Behavior During Therapy WFL for tasks assessed/performed               Past Medical History:  Diagnosis Date   Anemia    Anxiety    Arthritis    Asthma    Bunion    Callus    Chronic pain    Cocaine abuse (HCC)    COPD (chronic obstructive pulmonary disease) (HCC)    Corns and callosities    Degenerative joint disease    Depression    Diabetes mellitus    Endometrial polyp    ETOH abuse    Gall stones    GERD (gastroesophageal reflux disease)    Headache    history of Migraines   Hepatitis C    Hep C   Hyperlipidemia    Hypertension    Insomnia    Spondylolisthesis of lumbar region    Substance abuse ([place])    alcoholism   Tuberculosis 1985   Wears dentures    Wears glasses    Past Surgical History:  Procedure Laterality Date   ANTERIOR CERVICAL DECOMP/DISCECTOMY FUSION N/A 10/26/2021   Procedure: ACDF - C4-C5 - C5-C6 - C6-C7;  Surgeon: Earnie Larsson, MD;  Location: [facility];  Service: Neurosurgery;  Laterality: N/A;   BACK SURGERY  2018   CESAREAN SECTION     x3   CHOLECYSTECTOMY N/A 08/11/2016   Procedure: LAPAROSCOPIC CHOLECYSTECTOMY WITH   INTRAOPERATIVE CHOLANGIOGRAM;  Surgeon: Stark Klein, MD;  Location: [facility];  Service: General;  Laterality: N/A;   COLONOSCOPY     Cotton Osteotomy w/ Graft Left 06/18/2009    Excision of Benign Lesion Right 01/30/2013   Rt Plantar   FOOT SURGERY     HAMMER TOE REPAIR Right 02/12/2016   RIGHT #5   Hammertoe Repair Left 06/18/2009   Lt #5   HYSTEROSCOPY N/A 06/20/2017   Procedure: DILATION AND CURETTAGE, HYSTEROSCOPY w/ Polypectomy;  Surgeon: Lavonia Drafts, MD;  Location: [facility];  Service: Gynecology;  Laterality: N/A;   LUMBAR FUSION  2018   METATARSAL OSTEOTOMY Left 06/18/2009   #5   MULTIPLE TOOTH EXTRACTIONS     Nail Matrixectomy Left 06/18/2009   LT #1   OSTEOTOMY Right 01/30/2013   Rt #5   Phalangectomy Left 06/18/2009   LT #1   Phalangectomy Right 01/30/2013   Rt #1   TUBAL LIGATION     Patient Active Problem List   Diagnosis Date Noted   Candida vaginitis 09/16/2022   Spinal stenosis, lumbar 08/24/2022   Falls, initial encounter 08/20/2022   Frequent falls 08/20/2022   Leg weakness 08/20/2022   Lumbar stenosis 08/20/2022   Microcytic hypochromic anemia 08/20/2022   Bacterial vaginosis 05/16/2022   Cholelithiasis without obstruction 04/11/2022  Chronic idiopathic constipation 04/11/2022   Gallbladder calculus with acute cholecystitis and no obstruction 04/11/2022   History of adenomatous polyp of colon 04/11/2022   Morbid obesity ([place]) 04/11/2022   Polysubstance abuse ([place]) 04/11/2022   Right lower quadrant pain 04/11/2022   Type 2 diabetes mellitus ([place]) 04/11/2022   Type II diabetes mellitus ([place]) 04/11/2022   Pharyngoesophageal dysphagia 04/05/2022   Hyperthyroidism 12/29/2021   Cervical spondylosis with myelopathy and radiculopathy 10/26/2021   Bilateral impacted cerumen 09/16/2020   Itching of ear 09/16/2020   Heartburn 07/10/2020   Lumbar radiculopathy 04/03/2020   Hearing loss of right ear 03/12/2020   Plantar flexed metatarsal bone of left foot 08/14/2019   Skin burn 07/09/2019   Uncontrolled type 2 diabetes mellitus with hyperglycemia ([place]) 06/19/2019   Hypertensive retinopathy of both eyes 03/06/2019   Mild  nonproliferative diabetic retinopathy of both eyes ([place]) 03/06/2019   Essential hypertension 10/05/2018   Cramp and spasm 07/20/2018   Lactic acidosis    SIRS (systemic inflammatory response syndrome) ([place])    Acute renal failure ([place])    Type 2 diabetes mellitus with stage 3b chronic kidney disease, without long-term current use of insulin (HCC)    Acute blood loss anemia    Sepsis ([place]) 07/17/2017   Hyponatremia 07/17/2017   Postoperative wound infection 07/17/2017   AKI (acute kidney injury) ([place]) 07/17/2017   Dehydration 07/17/2017   DM type 2 (diabetes mellitus, type 2) ([place]) 07/17/2017   Acute encephalopathy 07/17/2017   Degenerative spondylolisthesis 07/03/2017   Post-menopausal bleeding 06/20/2017   Chronic hepatitis C ([place]) 12/09/2015   Inclusion cyst 09/16/2014   Onychomycosis 08/15/2014   Type I (juvenile type) diabetes mellitus with renal manifestations, not stated as uncontrolled(250.41) 06/10/2014   Unspecified constipation 05/22/2014   Gastroesophageal reflux disease without esophagitis 05/22/2014   Pain in lower limb 01/29/2014   Eschar of multiple sites 07/31/2013   Porokeratosis 07/02/2013   Cyst of joint of ankle or foot 06/04/2013   History of endometrial ablation 02/06/2013   Pain in joint, ankle and foot 01/23/2013   Callus of foot 01/23/2013   Ingrown nail 01/23/2013   Deformity of metatarsal 01/23/2013    ONSET DATE: 08/31/2022 (referral date)  REFERRING DIAG: M48.062 (ICD-10-CM) - Spinal stenosis of lumbar region with neurogenic claudication  THERAPY DIAG:  Muscle weakness (generalized)  Other low back pain  Other abnormalities of gait and mobility  Acute pain of right knee  Acute pain of left knee  Spinal stenosis of lumbar region with neurogenic claudication  Rationale for Evaluation and Treatment: Rehabilitation  SUBJECTIVE:  SUBJECTIVE STATEMENT: Pt reports her pain has been doing better, no med changes. No falls or acute changes. Pt does report she has an increase in her low back pain when she removes her TLSO at the end of the day.  Pt accompanied by: family member-daughter dropped pt off (sister is primary transportation)  PERTINENT HISTORY: GERD, DM II w/ retinopathy, Stage 3b CKD, HTN, right hearing loss, polysubstance abuse, hx of falls  "Presented to [facility] emergency department on 11/10 after reporting several falls and bilateral lower extremity weakness.  She is status post L4-5 and L5-S1 decompression and fusion surgery in 2018 and in 2021, respectively.   She also had C4-C7 ACDF in January 2023 by Dr. Jordan Likes.  Transferred to [facility]. Neurosurgery was consulted and MRI performed. She is followed by Dr. Dutch Quint who noted severe bilateral lower extremity weakness which left her nonambulatory.  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  She had failed conservative management including therapy and IV steroids.  She she was unable to stand or ambulate.  She was also reporting difficulty with bowel and bladder function.  She was taken to the operating room on 11/13 and underwent bilateral L3-4 decompressive laminectomies and foraminectomies, as well as posterior lumbar interbody fusion and arthrodesis."  PAIN:  Are you having pain? Yes: NPRS scale: 6/10 Pain location: low back and bilateral knees Pain description: sore, achy Aggravating factors: "I can't tell" Relieving factors: elevating legs  PRECAUTIONS: Back, Fall, and Other: TLSO-NO BENDING/LIFTING/TWISTING-PT to reach out to MD regarding wear time as not able to locate in chart, incision is healing but sensitive  WEIGHT BEARING RESTRICTIONS: No  FALLS: Has patient fallen in last 6 months? Yes. Number of falls 3; right leg  was giving out when she was using a cane, and general imbalance  LIVING ENVIRONMENT: Lives with: lives alone Lives in: House/apartment Stairs: No Has following equipment at home: Single point cane, Environmental consultant - 2 wheeled, Environmental consultant - 4 wheeled, and shower chair  PLOF: Independent with household mobility with device, Independent with community mobility with device, and Needs assistance with homemaking  PATIENT GOALS: "To get my balance back and be in control of my walking."  OBJECTIVE:   DIAGNOSTIC FINDINGS: Upcoming imaging of lumbar spine scheduled   TODAY'S TREATMENT:                                                                                                                               THER ACT: At countertop with one UE support: Tandem gait Resisted sidesteps with RTB Resisted monster walks with RTB Alt L/R 6" step-ups  At mat table standing with BUE support on rollator: SLS x 30 sec on each LE Tandem stance x 30 sec with each LE forwards  Added to HEP, see bolded below  Educated pt on importance of continuing to wear her TLSO as ordered by her surgeon until she gets the all  clear to stop wearing it. Also educated pt that her core musculature is going to be weaker following surgery and wearing the brace leading to increased pain and that once her restrictions are lifted therapy can begin to work on core strengthening which should improve her pain and stability of that region.  THER EX: SciFit multi-peaks level 1 for 8 minutes using BUE/BLEs for neural priming for reciprocal movement, dynamic cardiovascular warmup and increased amplitude of stepping.    PATIENT EDUCATION: Education details: initiated new HEP Person educated: Patient Education method: Explanation, Media planner, and Handouts Education comprehension: verbalized understanding  HOME EXERCISE PROGRAM: Access Code: [email or web address] URL: [email or web address] Date: 09/30/2022 Prepared by: Excell Seltzer  Exercises - Side Stepping with Resistance at Ankles and Counter Support  - 1 x daily - 7 x weekly - 3 sets - 10 reps - Forward Monster Walk with Resistance at Ankles and Counter Support  - 1 x daily - 7 x weekly - 3 sets - 10 reps - Forward Step Up with Counter Support  - 1 x daily - 7 x weekly - 3 sets - 10 reps - Standing Single Leg Stance with Counter Support  - 1 x daily - 7 x weekly - 1 sets - 10 reps - 30 sec hold - Standing Tandem Balance with Counter Support  - 1 x daily - 7 x weekly - 1 sets - 10 reps - 30 sec hold   GOALS: Goals reviewed with patient? Yes  SHORT TERM GOALS: Target date: 10/07/2022  Pt will be independent with strength and balance HEP. Baseline:  To be established. Goal status: INITIAL  2.  Pt will report pain at rest of </= 5/10 in low back and legs to demonstrate subjective improvement in quality of life. Baseline: 7/10 Goal status: INITIAL  3.  Pt will decrease 5xSTS to </=13 seconds w/o UE support in order to demonstrate decreased risk for falls and improved functional bilateral LE strength and power. Baseline: 16.13 sec w/ hands on knees Goal status: INITIAL  4.  Pt will improve gait velocity to at least 2.0 ft/sec for improved gait efficiency and performance at mod I level  Baseline: 1.68 ft/sec (12/8) Goal status: INITIAL  5.  Pt will improve Berg score to 41/56 for decreased fall risk Baseline: 36/56 (12/8) Goal status: INITIAL  6.  DGI to be assessed at STG deadline if appropriate w/ LTG set. Baseline: To be determined. Goal status: INITIAL  LONG TERM GOALS: Target date: 11/04/2022  Pt will maintain walking program greater than or equal to 4 days a week to increase ambulatory tolerance and general aerobic capacity. Baseline: To be established. Goal status: INITIAL  2.  Pt will ambulate >/= 600 feet on 6MWT using LRAD to demonstrate improved functional endurance for home and community participation. Baseline: 79' w/  rollator Goal status: INITIAL  3. Pt will improve gait velocity to at least 2.5 ft/sec for improved gait efficiency and performance at mod I level  Baseline: 1.68 ft/sec (12/8) Goal status: INITIAL  4.  Pt will improve Berg score to 47/56 for decreased fall risk Baseline: 36/56 (12/8) Goal status: INITIAL  5.  Pt will improve DGI to 19/24 for decreased fall risk  Baseline: 11/24 (12/8) Goal status: INITIAL  ASSESSMENT:  CLINICAL IMPRESSION: Emphasis of skilled PT session on initiating new HEP for LE strengthening and balance. Pt continues to exhibit decreased BLE strength with increased UE reliance during standing tasks. Pt also exhibits decreased balance in SLS and in  tandem stance. Pt continues to benefit from skilled therapy services to improve her strength and balance in order to decrease her fall risk. Pt will also benefit from core strengthening once her back precautions are lifted. Continue POC.   OBJECTIVE IMPAIRMENTS: Abnormal gait, decreased activity tolerance, decreased balance, decreased endurance, decreased strength, improper body mechanics, postural dysfunction, and pain.   ACTIVITY LIMITATIONS: carrying, lifting, bending, squatting, stairs, transfers, and locomotion level  PARTICIPATION LIMITATIONS: meal prep, cleaning, laundry, driving, shopping, and community activity  PERSONAL FACTORS: Age, Fitness, Past/current experiences, Time since onset of injury/illness/exacerbation, and 1-2 comorbidities: HTN, CKD  are also affecting patient's functional outcome.   REHAB POTENTIAL: Good  CLINICAL DECISION MAKING: Evolving/moderate complexity  EVALUATION COMPLEXITY: Moderate  PLAN:  PT FREQUENCY: 1x/week (will likely inc to 2x/week at re-cert as pt comes out of brace)  PT DURATION: 8 weeks  PLANNED INTERVENTIONS: Therapeutic exercises, Therapeutic activity, Neuromuscular re-education, Balance training, Gait training, Patient/Family education, Self Care, Stair training,  Vestibular training, Orthotic/Fit training, DME instructions, scar mobilization, Manual therapy, and Re-evaluation  PLAN FOR NEXT SESSION: how is HEP? Add to HEP as needed, assess STG 12/29   Excell Seltzer, PT, DPT, CSRS 09/30/2022, 1:17 PM

## 2022-10-02 ENCOUNTER — Other Ambulatory Visit: Payer: Self-pay | Admitting: Physician Assistant

## 2022-10-02 DIAGNOSIS — E785 Hyperlipidemia, unspecified: Secondary | ICD-10-CM

## 2022-10-07 ENCOUNTER — Encounter: Payer: Self-pay | Admitting: Physical Therapy

## 2022-10-07 ENCOUNTER — Ambulatory Visit: Payer: Medicaid Other | Admitting: Physical Therapy

## 2022-10-07 DIAGNOSIS — M25562 Pain in left knee: Secondary | ICD-10-CM

## 2022-10-07 DIAGNOSIS — M25561 Pain in right knee: Secondary | ICD-10-CM

## 2022-10-07 DIAGNOSIS — M5459 Other low back pain: Secondary | ICD-10-CM

## 2022-10-07 DIAGNOSIS — M48062 Spinal stenosis, lumbar region with neurogenic claudication: Secondary | ICD-10-CM

## 2022-10-07 DIAGNOSIS — M6281 Muscle weakness (generalized): Secondary | ICD-10-CM | POA: Diagnosis not present

## 2022-10-07 DIAGNOSIS — R2689 Other abnormalities of gait and mobility: Secondary | ICD-10-CM

## 2022-10-07 NOTE — Therapy (Signed)
OUTPATIENT PHYSICAL THERAPY NEURO TREATMENT   Patient Name: Kara Kelley MRN: [id number] DOB:05/01/1956, 66 y.o., female Today's Date: 10/07/2022   PCP: Camillia Herter, NP REFERRING PROVIDER: Barbie Banner, PA-C  END OF SESSION:  PT End of Session - 10/07/22 1316     Visit Number 4    Number of Visits 9   8+eval   Date for PT Re-Evaluation 11/11/22   pushed out due to delay in scheduling   Authorization Type Traditional Medicaid    PT Start Time 1316    PT Stop Time 1346    PT Time Calculation (min) 30 min    Equipment Utilized During Treatment Other (comment);Gait belt   TLSO   Activity Tolerance Patient tolerated treatment well    Behavior During Therapy WFL for tasks assessed/performed               Past Medical History:  Diagnosis Date   Anemia    Anxiety    Arthritis    Asthma    Bunion    Callus    Chronic pain    Cocaine abuse (HCC)    COPD (chronic obstructive pulmonary disease) (HCC)    Corns and callosities    Degenerative joint disease    Depression    Diabetes mellitus    Endometrial polyp    ETOH abuse    Gall stones    GERD (gastroesophageal reflux disease)    Headache    history of Migraines   Hepatitis C    Hep C   Hyperlipidemia    Hypertension    Insomnia    Spondylolisthesis of lumbar region    Substance abuse ([place])    alcoholism   Tuberculosis 1985   Wears dentures    Wears glasses    Past Surgical History:  Procedure Laterality Date   ANTERIOR CERVICAL DECOMP/DISCECTOMY FUSION N/A 10/26/2021   Procedure: ACDF - C4-C5 - C5-C6 - C6-C7;  Surgeon: Earnie Larsson, MD;  Location: [facility];  Service: Neurosurgery;  Laterality: N/A;   BACK SURGERY  2018   CESAREAN SECTION     x3   CHOLECYSTECTOMY N/A 08/11/2016   Procedure: LAPAROSCOPIC CHOLECYSTECTOMY WITH   INTRAOPERATIVE CHOLANGIOGRAM;  Surgeon: Stark Klein, MD;  Location: [facility];  Service: General;  Laterality: N/A;   COLONOSCOPY     Cotton Osteotomy w/ Graft Left 06/18/2009    Excision of Benign Lesion Right 01/30/2013   Rt Plantar   FOOT SURGERY     HAMMER TOE REPAIR Right 02/12/2016   RIGHT #5   Hammertoe Repair Left 06/18/2009   Lt #5   HYSTEROSCOPY N/A 06/20/2017   Procedure: DILATION AND CURETTAGE, HYSTEROSCOPY w/ Polypectomy;  Surgeon: Lavonia Drafts, MD;  Location: [facility];  Service: Gynecology;  Laterality: N/A;   LUMBAR FUSION  2018   METATARSAL OSTEOTOMY Left 06/18/2009   #5   MULTIPLE TOOTH EXTRACTIONS     Nail Matrixectomy Left 06/18/2009   LT #1   OSTEOTOMY Right 01/30/2013   Rt #5   Phalangectomy Left 06/18/2009   LT #1   Phalangectomy Right 01/30/2013   Rt #1   TUBAL LIGATION     Patient Active Problem List   Diagnosis Date Noted   Candida vaginitis 09/16/2022   Spinal stenosis, lumbar 08/24/2022   Falls, initial encounter 08/20/2022   Frequent falls 08/20/2022   Leg weakness 08/20/2022   Lumbar stenosis 08/20/2022   Microcytic hypochromic anemia 08/20/2022   Bacterial vaginosis 05/16/2022   Cholelithiasis without obstruction 04/11/2022  Chronic idiopathic constipation 04/11/2022   Gallbladder calculus with acute cholecystitis and no obstruction 04/11/2022   History of adenomatous polyp of colon 04/11/2022   Morbid obesity ([place]) 04/11/2022   Polysubstance abuse ([place]) 04/11/2022   Right lower quadrant pain 04/11/2022   Type 2 diabetes mellitus ([place]) 04/11/2022   Type II diabetes mellitus ([place]) 04/11/2022   Pharyngoesophageal dysphagia 04/05/2022   Hyperthyroidism 12/29/2021   Cervical spondylosis with myelopathy and radiculopathy 10/26/2021   Bilateral impacted cerumen 09/16/2020   Itching of ear 09/16/2020   Heartburn 07/10/2020   Lumbar radiculopathy 04/03/2020   Hearing loss of right ear 03/12/2020   Plantar flexed metatarsal bone of left foot 08/14/2019   Skin burn 07/09/2019   Uncontrolled type 2 diabetes mellitus with hyperglycemia ([place]) 06/19/2019   Hypertensive retinopathy of both eyes 03/06/2019   Mild  nonproliferative diabetic retinopathy of both eyes ([place]) 03/06/2019   Essential hypertension 10/05/2018   Cramp and spasm 07/20/2018   Lactic acidosis    SIRS (systemic inflammatory response syndrome) ([place])    Acute renal failure ([place])    Type 2 diabetes mellitus with stage 3b chronic kidney disease, without long-term current use of insulin (HCC)    Acute blood loss anemia    Sepsis ([place]) 07/17/2017   Hyponatremia 07/17/2017   Postoperative wound infection 07/17/2017   AKI (acute kidney injury) ([place]) 07/17/2017   Dehydration 07/17/2017   DM type 2 (diabetes mellitus, type 2) ([place]) 07/17/2017   Acute encephalopathy 07/17/2017   Degenerative spondylolisthesis 07/03/2017   Post-menopausal bleeding 06/20/2017   Chronic hepatitis C ([place]) 12/09/2015   Inclusion cyst 09/16/2014   Onychomycosis 08/15/2014   Type I (juvenile type) diabetes mellitus with renal manifestations, not stated as uncontrolled(250.41) 06/10/2014   Unspecified constipation 05/22/2014   Gastroesophageal reflux disease without esophagitis 05/22/2014   Pain in lower limb 01/29/2014   Eschar of multiple sites 07/31/2013   Porokeratosis 07/02/2013   Cyst of joint of ankle or foot 06/04/2013   History of endometrial ablation 02/06/2013   Pain in joint, ankle and foot 01/23/2013   Callus of foot 01/23/2013   Ingrown nail 01/23/2013   Deformity of metatarsal 01/23/2013    ONSET DATE: 08/31/2022 (referral date)  REFERRING DIAG: M48.062 (ICD-10-CM) - Spinal stenosis of lumbar region with neurogenic claudication  THERAPY DIAG:  Muscle weakness (generalized)  Other low back pain  Other abnormalities of gait and mobility  Acute pain of right knee  Acute pain of left knee  Spinal stenosis of lumbar region with neurogenic claudication  Rationale for Evaluation and Treatment: Rehabilitation  SUBJECTIVE:  SUBJECTIVE STATEMENT: Pt continues to endorses that her TLSO increases her back pain.  She states she does not wear the TLSO and is not wearing it today because of this.  She is unaware as to whether or not she is allowed to go without wearing the brace.  She denies falls or acute changes.  She canceled her follow-up appt on 10/12/2021 and has yet to reschedule.  She states that she is not doing any bending, lifting, or twisting other than to grab her cane when she sits it down. Pt accompanied by: family member-sister dropped her off today (sister is primary transportation)  PERTINENT HISTORY: GERD, DM II w/ retinopathy, Stage 3b CKD, HTN, right hearing loss, polysubstance abuse, hx of falls  "Presented to [facility] emergency department on 11/10 after reporting several falls and bilateral lower extremity weakness.  She is status post L4-5 and L5-S1 decompression and fusion surgery in 2018 and in 2021, respectively.   She also had C4-C7 ACDF in January 2023 by Dr. Annette Stable.  Transferred to [facility]. Neurosurgery was consulted and MRI performed. She is followed by Dr. Trenton Gammon who noted severe bilateral lower extremity weakness which left her nonambulatory.  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  She had failed conservative management including therapy and IV steroids.  She she was unable to stand or ambulate.  She was also reporting difficulty with bowel and bladder function.  She was taken to the operating room on 11/13 and underwent bilateral L3-4 decompressive laminectomies and foraminectomies, as well as posterior lumbar interbody fusion and arthrodesis."  PAIN:  Are you having pain? Yes: NPRS scale: 6/10 Pain location: low back Pain description: sore, achy Aggravating factors: "I can't tell" Relieving factors: elevating legs  PRECAUTIONS: Back, Fall, and Other:  TLSO-NO BENDING/LIFTING/TWISTING-PT to reach out to MD regarding wear time as not able to locate in chart, incision is healing but sensitive  WEIGHT BEARING RESTRICTIONS: No  FALLS: Has patient fallen in last 6 months? Yes. Number of falls 3; right leg was giving out when she was using a cane, and general imbalance  LIVING ENVIRONMENT: Lives with: lives alone Lives in: House/apartment Stairs: No Has following equipment at home: Single point cane, Environmental consultant - 2 wheeled, Environmental consultant - 4 wheeled, and shower chair  PLOF: Independent with household mobility with device, Independent with community mobility with device, and Needs assistance with homemaking  PATIENT GOALS: "To get my balance back and be in control of my walking."  OBJECTIVE:   DIAGNOSTIC FINDINGS: Upcoming imaging of lumbar spine scheduled   TODAY'S TREATMENT:                                                                                                                              -5xSTS:  11.69 sec w/ BUE support -10MWT w/ SPC:  10.93 sec = 0.91 m/sec OR 3.02 ft/sec -10MWT w/o AD:  11.22 sec = 0.89 m/sec OR  2.94 ft/sec -BERG:  [facility] PT Assessment - 10/07/22 1327       Berg Balance Test   Sit to Stand Able to stand without using hands and stabilize independently    Standing Unsupported Able to stand safely 2 minutes    Sitting with Back Unsupported but Feet Supported on Floor or Stool Able to sit safely and securely 2 minutes    Stand to Sit Sits safely with minimal use of hands    Transfers Able to transfer safely, minor use of hands    Standing Unsupported with Eyes Closed Able to stand 10 seconds safely    Standing Unsupported with Feet Together Able to place feet together independently and stand 1 minute safely    From Standing, Reach Forward with Outstretched Arm Can reach forward >5 cm safely (2")    From Standing Position, Pick up Object from Floor Unable to try/needs assist to keep balance   Did not have pt perform d/t  back precautions.   From Standing Position, Turn to Look Behind Over each Shoulder Turn sideways only but maintains balance   pt rotates neck   Turn 360 Degrees Able to turn 360 degrees safely in 4 seconds or less    Standing Unsupported, Alternately Place Feet on Step/Stool Able to stand independently and complete 8 steps >20 seconds    Standing Unsupported, One Foot in Front Able to take small step independently and hold 30 seconds    Standing on One Leg Tries to lift leg/unable to hold 3 seconds but remains standing independently    Total Score 42    Berg comment: 42/56=significant fall risk            PATIENT EDUCATION: Education details: Progress towards goals.  Encouraged to go ahead and reschedule follow-up and to wear back brace in therapy until MD clears her to go without.  Please continue walking w/ SPC until follow-up to prevent falls.  See pt instructions for note typed for pt to have MD sign at follow-up and return to clinic. Person educated: Patient Education method: Explanation, Demonstration, and Handouts Education comprehension: verbalized understanding  HOME EXERCISE PROGRAM: Access Code: [email or web address] URL: [email or web address] Date: 09/30/2022 Prepared by: Excell Seltzer  Exercises - Side Stepping with Resistance at Ankles and Counter Support  - 1 x daily - 7 x weekly - 3 sets - 10 reps - Forward Monster Walk with Resistance at Ankles and Counter Support  - 1 x daily - 7 x weekly - 3 sets - 10 reps - Forward Step Up with Counter Support  - 1 x daily - 7 x weekly - 3 sets - 10 reps - Standing Single Leg Stance with Counter Support  - 1 x daily - 7 x weekly - 1 sets - 10 reps - 30 sec hold - Standing Tandem Balance with Counter Support  - 1 x daily - 7 x weekly - 1 sets - 10 reps - 30 sec hold   GOALS: Goals reviewed with patient? Yes  SHORT TERM GOALS: Target date: 10/07/2022  Pt will be independent with strength and balance HEP. Baseline:   Compliant and needs advancement. Goal status: MET  2.  Pt will report pain at rest of </= 5/10 in low back and legs to demonstrate subjective improvement in quality of life. Baseline: 7/10; 6/10 (12/29) Goal status: IN PROGRESS  3.  Pt will decrease 5xSTS to </=13 seconds w/o UE support in order to demonstrate decreased risk for falls and improved functional bilateral  LE strength and power. Baseline: 16.13 sec w/ hands on knees; 11.69 sec w/ BUE support (12/29) Goal status: IN PROGRESS  4.  Pt will improve gait velocity to at least 2.0 ft/sec for improved gait efficiency and performance at mod I level  Baseline: 1.68 ft/sec (12/8); 3.02 ft/sec w/ SPC (12/29) Goal status: MET  5.  Pt will improve Berg score to 41/56 for decreased fall risk Baseline: 36/56 (12/8); 42/56 (12/29) Goal status: MET  6.  DGI to be assessed at STG deadline if appropriate w/ LTG set. Baseline: 11/24 (12/8) Goal status: MET  LONG TERM GOALS: Target date: 11/04/2022  Pt will maintain walking program greater than or equal to 4 days a week to increase ambulatory tolerance and general aerobic capacity. Baseline: To be established. Goal status: INITIAL  2.  Pt will ambulate >/= 600 feet on 6MWT using LRAD to demonstrate improved functional endurance for home and community participation. Baseline: 63' w/ rollator Goal status: INITIAL  3. Pt will improve gait velocity to at least 3.3 ft/sec for improved gait efficiency and performance at mod I level  Baseline: 1.68 ft/sec (12/8); 3.02 ft/sec w/ SPC (12/29) Goal status: REVISED  4.  Pt will improve Berg score to 47/56 for decreased fall risk Baseline: 36/56 (12/8) Goal status: INITIAL  5.  Pt will improve DGI to 19/24 for decreased fall risk  Baseline: 11/24 (12/8) Goal status: INITIAL  ASSESSMENT:  CLINICAL IMPRESSION: Pt is making great progress towards LTGs, but is somewhat limited by lingering precautions.  Pt met 4 of 6 goals today exceeding gait  speed long-term goal in addition to STG at 3.02 ft/sec.  DGI was assessed at prior visit w/ pt scoring 11/24 and demonstrating improvement on BERG to 42/56 this session.  She uses BUE to perform 5xSTS in under 13 seconds, but can stand and stabilize without UE use so will continue to work on LE strengthening and confidence with this.  Pt was not wearing TLSO today due to ongoing discomfort with donning and wearing for prolonged period.  PT typed up note for MD follow-up to have pt address lingering precautions with doctor in order to progress per POC.   OBJECTIVE IMPAIRMENTS: Abnormal gait, decreased activity tolerance, decreased balance, decreased endurance, decreased strength, improper body mechanics, postural dysfunction, and pain.   ACTIVITY LIMITATIONS: carrying, lifting, bending, squatting, stairs, transfers, and locomotion level  PARTICIPATION LIMITATIONS: meal prep, cleaning, laundry, driving, shopping, and community activity  PERSONAL FACTORS: Age, Fitness, Past/current experiences, Time since onset of injury/illness/exacerbation, and 1-2 comorbidities: HTN, CKD  are also affecting patient's functional outcome.   REHAB POTENTIAL: Good  CLINICAL DECISION MAKING: Evolving/moderate complexity  EVALUATION COMPLEXITY: Moderate  PLAN:  PT FREQUENCY: 1x/week (will likely inc to 2x/week at re-cert as pt comes out of brace)  PT DURATION: 8 weeks  PLANNED INTERVENTIONS: Therapeutic exercises, Therapeutic activity, Neuromuscular re-education, Balance training, Gait training, Patient/Family education, Self Care, Stair training, Vestibular training, Orthotic/Fit training, DME instructions, scar mobilization, Manual therapy, and Re-evaluation  PLAN FOR NEXT SESSION: Advance HEP- STS no UE support; did pt have MD sign and return precautions note typed last session?  If precautions lifted could do core on mat.   Bary Richard, PT, DPT 10/07/2022, 1:51 PM

## 2022-10-07 NOTE — Patient Instructions (Signed)
Please have doctor fill out and return to PT clinic: Remaining precautions (bending/lifting-weight limit specifically/twisting):  Can she go without the TLSO at any periods during the day?    Medication options for pain management (per pt request):   MD signature:   Date:

## 2022-10-12 ENCOUNTER — Ambulatory Visit: Payer: Medicaid Other | Admitting: Internal Medicine

## 2022-10-14 ENCOUNTER — Ambulatory Visit: Payer: Medicaid Other | Admitting: Physical Therapy

## 2022-10-14 NOTE — Therapy (Incomplete)
OUTPATIENT PHYSICAL THERAPY NEURO TREATMENT   Patient Name: Kara Kelley MRN: [id number] DOB:[birth year], 67 y.o., female Today's Date: 10/14/2022   PCP: Rema Fendt, NP REFERRING PROVIDER: Milinda Antis, PA-C  END OF SESSION:      Past Medical History:  Diagnosis Date   Anemia    Anxiety    Arthritis    Asthma    Bunion    Callus    Chronic pain    Cocaine abuse (HCC)    COPD (chronic obstructive pulmonary disease) (HCC)    Corns and callosities    Degenerative joint disease    Depression    Diabetes mellitus    Endometrial polyp    ETOH abuse    Gall stones    GERD (gastroesophageal reflux disease)    Headache    history of Migraines   Hepatitis C    Hep C   Hyperlipidemia    Hypertension    Insomnia    Spondylolisthesis of lumbar region    Substance abuse (HCC)    alcoholism   Tuberculosis 1985   Wears dentures    Wears glasses    Past Surgical History:  Procedure Laterality Date   ANTERIOR CERVICAL DECOMP/DISCECTOMY FUSION N/A 10/26/2021   Procedure: ACDF - C4-C5 - C5-C6 - C6-C7;  Surgeon: Julio Sicks, MD;  Location: [facility];  Service: Neurosurgery;  Laterality: N/A;   BACK SURGERY  2018   CESAREAN SECTION     x3   CHOLECYSTECTOMY N/A 08/11/2016   Procedure: LAPAROSCOPIC CHOLECYSTECTOMY WITH   INTRAOPERATIVE CHOLANGIOGRAM;  Surgeon: Almond Lint, MD;  Location: [facility];  Service: General;  Laterality: N/A;   COLONOSCOPY     Cotton Osteotomy w/ Graft Left 06/18/2009   Excision of Benign Lesion Right 01/30/2013   Rt Plantar   FOOT SURGERY     HAMMER TOE REPAIR Right 02/12/2016   RIGHT #5   Hammertoe Repair Left 06/18/2009   Lt #5   HYSTEROSCOPY N/A 06/20/2017   Procedure: DILATION AND CURETTAGE, HYSTEROSCOPY w/ Polypectomy;  Surgeon: Willodean Rosenthal, MD;  Location: [facility];  Service: Gynecology;  Laterality: N/A;   LUMBAR FUSION  2018   METATARSAL OSTEOTOMY Left 06/18/2009   #5   MULTIPLE TOOTH EXTRACTIONS     Nail Matrixectomy Left  06/18/2009   LT #1   OSTEOTOMY Right 01/30/2013   Rt #5   Phalangectomy Left 06/18/2009   LT #1   Phalangectomy Right 01/30/2013   Rt #1   TUBAL LIGATION     Patient Active Problem List   Diagnosis Date Noted   Candida vaginitis 09/16/2022   Spinal stenosis, lumbar 08/24/2022   Falls, initial encounter 08/20/2022   Frequent falls 08/20/2022   Leg weakness 08/20/2022   Lumbar stenosis 08/20/2022   Microcytic hypochromic anemia 08/20/2022   Bacterial vaginosis 05/16/2022   Cholelithiasis without obstruction 04/11/2022   Chronic idiopathic constipation 04/11/2022   Gallbladder calculus with acute cholecystitis and no obstruction 04/11/2022   History of adenomatous polyp of colon 04/11/2022   Morbid obesity (HCC) 04/11/2022   Polysubstance abuse (HCC) 04/11/2022   Right lower quadrant pain 04/11/2022   Type 2 diabetes mellitus (HCC) 04/11/2022   Type II diabetes mellitus (HCC) 04/11/2022   Pharyngoesophageal dysphagia 04/05/2022   Hyperthyroidism 12/29/2021   Cervical spondylosis with myelopathy and radiculopathy 10/26/2021   Bilateral impacted cerumen 09/16/2020   Itching of ear 09/16/2020   Heartburn 07/10/2020   Lumbar radiculopathy 04/03/2020   Hearing loss of right ear 03/12/2020  Plantar flexed metatarsal bone of left foot 08/14/2019   Skin burn 07/09/2019   Uncontrolled type 2 diabetes mellitus with hyperglycemia (HCC) 06/19/2019   Hypertensive retinopathy of both eyes 03/06/2019   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) 03/06/2019   Essential hypertension 10/05/2018   Cramp and spasm 07/20/2018   Lactic acidosis    SIRS (systemic inflammatory response syndrome) (HCC)    Acute renal failure (HCC)    Type 2 diabetes mellitus with stage 3b chronic kidney disease, without long-term current use of insulin (HCC)    Acute blood loss anemia    Sepsis (HCC) 07/17/2017   Hyponatremia 07/17/2017   Postoperative wound infection 07/17/2017   AKI (acute  kidney injury) (HCC) 07/17/2017   Dehydration 07/17/2017   DM type 2 (diabetes mellitus, type 2) (HCC) 07/17/2017   Acute encephalopathy 07/17/2017   Degenerative spondylolisthesis 07/03/2017   Post-menopausal bleeding 06/20/2017   Chronic hepatitis C (HCC) 12/09/2015   Inclusion cyst 09/16/2014   Onychomycosis 08/15/2014   Type I (juvenile type) diabetes mellitus with renal manifestations, not stated as uncontrolled(250.41) 06/10/2014   Unspecified constipation 05/22/2014   Gastroesophageal reflux disease without esophagitis 05/22/2014   Pain in lower limb 01/29/2014   Eschar of multiple sites 07/31/2013   Porokeratosis 07/02/2013   Cyst of joint of ankle or foot 06/04/2013   History of endometrial ablation 02/06/2013   Pain in joint, ankle and foot 01/23/2013   Callus of foot 01/23/2013   Ingrown nail 01/23/2013   Deformity of metatarsal 01/23/2013    ONSET DATE: 08/31/2022 (referral date)  REFERRING DIAG: M48.062 (ICD-10-CM) - Spinal stenosis of lumbar region with neurogenic claudication  THERAPY DIAG:  No diagnosis found.  Rationale for Evaluation and Treatment: Rehabilitation  SUBJECTIVE:                                                                                                                                                                                             SUBJECTIVE STATEMENT: ***  Pt accompanied by: family member-sister dropped her off today (sister is primary transportation)  PERTINENT HISTORY: GERD, DM II w/ retinopathy, Stage 3b CKD, HTN, right hearing loss, polysubstance abuse, hx of falls  "Presented to [facility] emergency department on 11/10 after reporting several falls and bilateral lower extremity weakness.  She is status post L4-5 and L5-S1 decompression and fusion surgery in 2018 and in 2021, respectively.   She also had C4-C7 ACDF in January 2023 by Dr. Jordan Likes.  Transferred to [facility]. Neurosurgery was consulted and MRI performed. She is  followed by Dr. Dutch Quint who noted severe bilateral lower extremity weakness which left her  nonambulatory.  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  She had failed conservative management including therapy and IV steroids.  She she was unable to stand or ambulate.  She was also reporting difficulty with bowel and bladder function.  She was taken to the operating room on 11/13 and underwent bilateral L3-4 decompressive laminectomies and foraminectomies, as well as posterior lumbar interbody fusion and arthrodesis."  PAIN:  Are you having pain? Yes: NPRS scale: 6/10 Pain location: low back Pain description: sore, achy Aggravating factors: "I can't tell" Relieving factors: elevating legs  PRECAUTIONS: Back, Fall, and Other: TLSO-NO BENDING/LIFTING/TWISTING-PT to reach out to MD regarding wear time as not able to locate in chart, incision is healing but sensitive  WEIGHT BEARING RESTRICTIONS: No  FALLS: Has patient fallen in last 6 months? Yes. Number of falls 3; right leg was giving out when she was using a cane, and general imbalance  LIVING ENVIRONMENT: Lives with: lives alone Lives in: House/apartment Stairs: No Has following equipment at home: Single point cane, Environmental consultant - 2 wheeled, Environmental consultant - 4 wheeled, and shower chair  PLOF: Independent with household mobility with device, Independent with community mobility with device, and Needs assistance with homemaking  PATIENT GOALS: "To get my balance back and be in control of my walking."  OBJECTIVE:   DIAGNOSTIC FINDINGS: Upcoming imaging of lumbar spine scheduled   TODAY'S TREATMENT:                                                                                                                              ***   PATIENT EDUCATION: Education details: Progress towards goals.  Encouraged to go ahead and reschedule follow-up and to wear back brace in therapy until MD clears her  to go without.  Please continue walking w/ SPC until follow-up to prevent falls.  See pt instructions for note typed for pt to have MD sign at follow-up and return to clinic.*** Person educated: Patient Education method: Explanation, Demonstration, and Handouts Education comprehension: verbalized understanding  HOME EXERCISE PROGRAM: Access Code: [email or web address] URL: [email or web address] Date: 09/30/2022 Prepared by: Excell Seltzer  Exercises - Side Stepping with Resistance at Ankles and Counter Support  - 1 x daily - 7 x weekly - 3 sets - 10 reps - Forward Monster Walk with Resistance at Ankles and Counter Support  - 1 x daily - 7 x weekly - 3 sets - 10 reps - Forward Step Up with Counter Support  - 1 x daily - 7 x weekly - 3 sets - 10 reps - Standing Single Leg Stance with Counter Support  - 1 x daily - 7 x weekly - 1 sets - 10 reps - 30 sec hold - Standing Tandem Balance with Counter Support  - 1 x daily - 7 x weekly - 1 sets - 10 reps - 30 sec hold   GOALS: Goals reviewed with patient? Yes  SHORT TERM  GOALS: Target date: 10/07/2022  Pt will be independent with strength and balance HEP. Baseline:  Compliant and needs advancement. Goal status: MET  2.  Pt will report pain at rest of </= 5/10 in low back and legs to demonstrate subjective improvement in quality of life. Baseline: 7/10; 6/10 (12/29) Goal status: IN PROGRESS  3.  Pt will decrease 5xSTS to </=13 seconds w/o UE support in order to demonstrate decreased risk for falls and improved functional bilateral LE strength and power. Baseline: 16.13 sec w/ hands on knees; 11.69 sec w/ BUE support (12/29) Goal status: IN PROGRESS  4.  Pt will improve gait velocity to at least 2.0 ft/sec for improved gait efficiency and performance at mod I level  Baseline: 1.68 ft/sec (12/8); 3.02 ft/sec w/ SPC (12/29) Goal status: MET  5.  Pt will improve Berg score to 41/56 for decreased fall risk Baseline: 36/56 (12/8); 42/56  (12/29) Goal status: MET  6.  DGI to be assessed at STG deadline if appropriate w/ LTG set. Baseline: 11/24 (12/8) Goal status: MET  LONG TERM GOALS: Target date: 11/04/2022  Pt will maintain walking program greater than or equal to 4 days a week to increase ambulatory tolerance and general aerobic capacity. Baseline: To be established. Goal status: INITIAL  2.  Pt will ambulate >/= 600 feet on 6MWT using LRAD to demonstrate improved functional endurance for home and community participation. Baseline: 41' w/ rollator Goal status: INITIAL  3. Pt will improve gait velocity to at least 3.3 ft/sec for improved gait efficiency and performance at mod I level  Baseline: 1.68 ft/sec (12/8); 3.02 ft/sec w/ SPC (12/29) Goal status: REVISED  4.  Pt will improve Berg score to 47/56 for decreased fall risk Baseline: 36/56 (12/8) Goal status: INITIAL  5.  Pt will improve DGI to 19/24 for decreased fall risk  Baseline: 11/24 (12/8) Goal status: INITIAL  ASSESSMENT:  CLINICAL IMPRESSION: Emphasis of skilled PT session*** Continue POC.    OBJECTIVE IMPAIRMENTS: Abnormal gait, decreased activity tolerance, decreased balance, decreased endurance, decreased strength, improper body mechanics, postural dysfunction, and pain.   ACTIVITY LIMITATIONS: carrying, lifting, bending, squatting, stairs, transfers, and locomotion level  PARTICIPATION LIMITATIONS: meal prep, cleaning, laundry, driving, shopping, and community activity  PERSONAL FACTORS: Age, Fitness, Past/current experiences, Time since onset of injury/illness/exacerbation, and 1-2 comorbidities: HTN, CKD  are also affecting patient's functional outcome.   REHAB POTENTIAL: Good  CLINICAL DECISION MAKING: Evolving/moderate complexity  EVALUATION COMPLEXITY: Moderate  PLAN:  PT FREQUENCY: 1x/week (will likely inc to 2x/week at re-cert as pt comes out of brace)  PT DURATION: 8 weeks  PLANNED INTERVENTIONS: Therapeutic exercises,  Therapeutic activity, Neuromuscular re-education, Balance training, Gait training, Patient/Family education, Self Care, Stair training, Vestibular training, Orthotic/Fit training, DME instructions, scar mobilization, Manual therapy, and Re-evaluation  PLAN FOR NEXT SESSION: Advance HEP- STS no UE support; did pt have MD sign and return precautions note typed last session?  If precautions lifted could do core on mat.***   Excell Seltzer, PT, DPT, CSRS 10/14/2022, 8:51 AM

## 2022-10-15 ENCOUNTER — Other Ambulatory Visit: Payer: Self-pay | Admitting: Physician Assistant

## 2022-10-20 NOTE — Therapy (Deleted)
OUTPATIENT OCCUPATIONAL THERAPY NEURO EVALUATION  Patient Name: Kara Kelley MRN: [id number] DOB:08-Dec-1955, 67 y.o., female Today's Date: 10/20/2022  PCP: *** REFERRING PROVIDER: ***  END OF SESSION:   Past Medical History:  Diagnosis Date   Anemia    Anxiety    Arthritis    Asthma    Bunion    Callus    Chronic pain    Cocaine abuse ([place])    COPD (chronic obstructive pulmonary disease) (HCC)    Corns and callosities    Degenerative joint disease    Depression    Diabetes mellitus    Endometrial polyp    ETOH abuse    Gall stones    GERD (gastroesophageal reflux disease)    Headache    history of Migraines   Hepatitis C    Hep C   Hyperlipidemia    Hypertension    Insomnia    Spondylolisthesis of lumbar region    Substance abuse ([place])    alcoholism   Tuberculosis 1985   Wears dentures    Wears glasses    Past Surgical History:  Procedure Laterality Date   ANTERIOR CERVICAL DECOMP/DISCECTOMY FUSION N/A 10/26/2021   Procedure: ACDF - C4-C5 - C5-C6 - C6-C7;  Surgeon: Earnie Larsson, MD;  Location: [facility];  Service: Neurosurgery;  Laterality: N/A;   BACK SURGERY  2018   CESAREAN SECTION     x3   CHOLECYSTECTOMY N/A 08/11/2016   Procedure: LAPAROSCOPIC CHOLECYSTECTOMY WITH   INTRAOPERATIVE CHOLANGIOGRAM;  Surgeon: Stark Klein, MD;  Location: [facility];  Service: General;  Laterality: N/A;   COLONOSCOPY     Cotton Osteotomy w/ Graft Left 06/18/2009   Excision of Benign Lesion Right 01/30/2013   Rt Plantar   FOOT SURGERY     HAMMER TOE REPAIR Right 02/12/2016   RIGHT #5   Hammertoe Repair Left 06/18/2009   Lt #5   HYSTEROSCOPY N/A 06/20/2017   Procedure: DILATION AND CURETTAGE, HYSTEROSCOPY w/ Polypectomy;  Surgeon: Lavonia Drafts, MD;  Location: [facility];  Service: Gynecology;  Laterality: N/A;   LUMBAR FUSION  2018   METATARSAL OSTEOTOMY Left 06/18/2009   #5   MULTIPLE TOOTH EXTRACTIONS     Nail Matrixectomy Left 06/18/2009   LT #1   OSTEOTOMY Right  01/30/2013   Rt #5   Phalangectomy Left 06/18/2009   LT #1   Phalangectomy Right 01/30/2013   Rt #1   TUBAL LIGATION     Patient Active Problem List   Diagnosis Date Noted   Candida vaginitis 09/16/2022   Spinal stenosis, lumbar 08/24/2022   Falls, initial encounter 08/20/2022   Frequent falls 08/20/2022   Leg weakness 08/20/2022   Lumbar stenosis 08/20/2022   Microcytic hypochromic anemia 08/20/2022   Bacterial vaginosis 05/16/2022   Cholelithiasis without obstruction 04/11/2022   Chronic idiopathic constipation 04/11/2022   Gallbladder calculus with acute cholecystitis and no obstruction 04/11/2022   History of adenomatous polyp of colon 04/11/2022   Morbid obesity ([place]) 04/11/2022   Polysubstance abuse ([place]) 04/11/2022   Right lower quadrant pain 04/11/2022   Type 2 diabetes mellitus ([place]) 04/11/2022   Type II diabetes mellitus ([place]) 04/11/2022   Pharyngoesophageal dysphagia 04/05/2022   Hyperthyroidism 12/29/2021   Cervical spondylosis with myelopathy and radiculopathy 10/26/2021   Bilateral impacted cerumen 09/16/2020   Itching of ear 09/16/2020   Heartburn 07/10/2020   Lumbar radiculopathy 04/03/2020   Hearing loss of right ear 03/12/2020   Plantar flexed metatarsal bone of left foot 08/14/2019   Skin  burn 07/09/2019   Uncontrolled type 2 diabetes mellitus with hyperglycemia ([place]) 06/19/2019   Hypertensive retinopathy of both eyes 03/06/2019   Mild nonproliferative diabetic retinopathy of both eyes ([place]) 03/06/2019   Essential hypertension 10/05/2018   Cramp and spasm 07/20/2018   Lactic acidosis    SIRS (systemic inflammatory response syndrome) ([place])    Acute renal failure ([place])    Type 2 diabetes mellitus with stage 3b chronic kidney disease, without long-term current use of insulin (HCC)    Acute blood loss anemia    Sepsis ([place]) 07/17/2017   Hyponatremia 07/17/2017   Postoperative wound infection 07/17/2017   AKI (acute kidney injury) ([place]) 07/17/2017    Dehydration 07/17/2017   DM type 2 (diabetes mellitus, type 2) ([place]) 07/17/2017   Acute encephalopathy 07/17/2017   Degenerative spondylolisthesis 07/03/2017   Post-menopausal bleeding 06/20/2017   Chronic hepatitis C ([place]) 12/09/2015   Inclusion cyst 09/16/2014   Onychomycosis 08/15/2014   Type I (juvenile type) diabetes mellitus with renal manifestations, not stated as uncontrolled(250.41) 06/10/2014   Unspecified constipation 05/22/2014   Gastroesophageal reflux disease without esophagitis 05/22/2014   Pain in lower limb 01/29/2014   Eschar of multiple sites 07/31/2013   Porokeratosis 07/02/2013   Cyst of joint of ankle or foot 06/04/2013   History of endometrial ablation 02/06/2013   Pain in joint, ankle and foot 01/23/2013   Callus of foot 01/23/2013   Ingrown nail 01/23/2013   Deformity of metatarsal 01/23/2013    ONSET DATE: ***  REFERRING DIAG: ***  THERAPY DIAG:  No diagnosis found.  Rationale for Evaluation and Treatment: {HABREHAB:27488}  SUBJECTIVE:   SUBJECTIVE STATEMENT: *** Pt accompanied by: {accompnied:27141}  PERTINENT HISTORY: ***  PRECAUTIONS: {Therapy precautions:24002}  WEIGHT BEARING RESTRICTIONS: {Yes ***/No:24003}  PAIN:  Are you having pain? {OPRCPAIN:27236}  FALLS: Has patient fallen in last 6 months? {fallsyesno:27318}  LIVING ENVIRONMENT: Lives with: {OPRC lives with:25569::"lives with their family"} Lives in: {Lives in:25570} Stairs: {opstairs:27293} Has following equipment at home: {Assistive devices:23999}  PLOF: {PLOF:24004}  PATIENT GOALS: ***  OBJECTIVE:   HAND DOMINANCE: {MISC; OT HAND DOMINANCE:[phone]}  ADLs: Overall ADLs: *** Transfers/ambulation related to ADLs: Eating: *** Grooming: *** UB Dressing: *** LB Dressing: *** Toileting: *** Bathing: *** Tub Shower transfers: *** Equipment: {equipment:25573}  IADLs: Shopping: *** Light housekeeping: *** Meal Prep: *** Community mobility: *** Medication  management: *** Financial management: *** Handwriting: {OTWRITTENEXPRESSION:25361}  MOBILITY STATUS: {OTMOBILITY:25360}  POSTURE COMMENTS:  {posture:25561} Sitting balance: {sitting balance:25483}  ACTIVITY TOLERANCE: Activity tolerance: ***  FUNCTIONAL OUTCOME MEASURES: {OTFUNCTIONALMEASURES:27238}  UPPER EXTREMITY ROM:    {AROM/PROM:27142} ROM Right eval Left eval  Shoulder flexion    Shoulder abduction    Shoulder adduction    Shoulder extension    Shoulder internal rotation    Shoulder external rotation    Elbow flexion    Elbow extension    Wrist flexion    Wrist extension    Wrist ulnar deviation    Wrist radial deviation    Wrist pronation    Wrist supination    (Blank rows = not tested)  UPPER EXTREMITY MMT:     MMT Right eval Left eval  Shoulder flexion    Shoulder abduction    Shoulder adduction    Shoulder extension    Shoulder internal rotation    Shoulder external rotation    Middle trapezius    Lower trapezius    Elbow flexion    Elbow extension    Wrist flexion    Wrist extension  Wrist ulnar deviation    Wrist radial deviation    Wrist pronation    Wrist supination    (Blank rows = not tested)  HAND FUNCTION: {handfunction:27230}  COORDINATION: {otcoordination:27237}  SENSATION: {sensation:27233}  EDEMA: ***  MUSCLE TONE: {UETONE:25567}  COGNITION: Overall cognitive status: {cognition:24006}  VISION: Subjective report: *** Baseline vision: {OTBASELINEVISION:25363} Visual history: {OTVISUALHISTORY:25364}  VISION ASSESSMENT: {visionassessment:27231}  Patient has difficulty with following activities due to following visual impairments: ***  PERCEPTION: {Perception:25564}  PRAXIS: {Praxis:25565}  OBSERVATIONS: ***   TODAY'S TREATMENT:                                                                                                                              DATE: ***   PATIENT EDUCATION: Education details:  *** Person educated: {Person educated:25204} Education method: {Education Method:25205} Education comprehension: {Education Comprehension:25206}  HOME EXERCISE PROGRAM: ***   GOALS: Goals reviewed with patient? {yes/no:20286}  SHORT TERM GOALS: Target date: ***  *** Baseline: Goal status: {GOALSTATUS:25110}  2.  *** Baseline:  Goal status: {GOALSTATUS:25110}  3.  *** Baseline:  Goal status: {GOALSTATUS:25110}  4.  *** Baseline:  Goal status: {GOALSTATUS:25110}  5.  *** Baseline:  Goal status: {GOALSTATUS:25110}  6.  *** Baseline:  Goal status: {GOALSTATUS:25110}  LONG TERM GOALS: Target date: ***  *** Baseline:  Goal status: {GOALSTATUS:25110}  2.  *** Baseline:  Goal status: {GOALSTATUS:25110}  3.  *** Baseline:  Goal status: {GOALSTATUS:25110}  4.  *** Baseline:  Goal status: {GOALSTATUS:25110}  5.  *** Baseline:  Goal status: {GOALSTATUS:25110}  6.  *** Baseline:  Goal status: {GOALSTATUS:25110}  ASSESSMENT:  CLINICAL IMPRESSION: Patient is a *** y.o. *** who was seen today for occupational therapy evaluation for ***.   PERFORMANCE DEFICITS: in functional skills including {OT physical skills:25468}, cognitive skills including {OT cognitive skills:25469}, and psychosocial skills including {OT psychosocial skills:25470}.   IMPAIRMENTS: are limiting patient from {OT performance deficits:25471}.   CO-MORBIDITIES: {Comorbidities:25485} that affects occupational performance. Patient will benefit from skilled OT to address above impairments and improve overall function.  MODIFICATION OR ASSISTANCE TO COMPLETE EVALUATION: {OT modification:25474}  OT OCCUPATIONAL PROFILE AND HISTORY: {OT PROFILE AND HISTORY:25484}  CLINICAL DECISION MAKING: {OT CDM:25475}  REHAB POTENTIAL: {rehabpotential:25112}  EVALUATION COMPLEXITY: {Evaluation complexity:25115}    PLAN:  OT FREQUENCY: {rehab frequency:25116}  OT DURATION: {rehab  duration:25117}  PLANNED INTERVENTIONS: {OT Interventions:25467}  RECOMMENDED OTHER SERVICES: ***  CONSULTED AND AGREED WITH PLAN OF CARE: [id number]  PLAN FOR NEXT SESSION: Dennis Bast, OT 10/20/2022, 3:26 PM

## 2022-10-21 ENCOUNTER — Ambulatory Visit: Payer: Medicaid Other | Attending: Physician Assistant | Admitting: Occupational Therapy

## 2022-10-21 ENCOUNTER — Ambulatory Visit: Payer: Medicaid Other | Admitting: Physical Therapy

## 2022-10-21 ENCOUNTER — Telehealth: Payer: Self-pay | Admitting: Physical Therapy

## 2022-10-21 DIAGNOSIS — M6281 Muscle weakness (generalized): Secondary | ICD-10-CM | POA: Insufficient documentation

## 2022-10-21 DIAGNOSIS — M5459 Other low back pain: Secondary | ICD-10-CM | POA: Insufficient documentation

## 2022-10-21 DIAGNOSIS — R2689 Other abnormalities of gait and mobility: Secondary | ICD-10-CM | POA: Insufficient documentation

## 2022-10-21 NOTE — Therapy (Signed)
[facility] [address] [place], [place], [address] Phone: [phone]   Fax:  [phone]  Patient Details -ARRIVED NO CHARGE Name: Kara Kelley MRN: [id number] Date of Birth: Feb 10, 1956 Referring Provider:  Camillia Herter, NP  Encounter Date: 10/21/2022  Pt ambulates into clinic today using rollator in forward bent position.  She explains to PT the past couple of days she has had a change in her symptoms where her back pain is radiating into the posterior thigh.  She continues to not wear her TLSO today (PT emphasized she needs to wear brace until cleared by surgeon).  Pt missed appt with Dr. Trenton Gammon so was unable to have paper provided with questions (see pt instructions) filled out.  Re-printed for pt as she does not know where this paper is.  Encouraged pt to reschedule appt with Dr. Trenton Gammon and to cancel following PT appt if can not see him prior to that appt next Friday due to recent change in symptoms in combination with consistent non-compliance to TLSO wear schedule.  Will follow-up with MD as able.  Bary Richard, PT, DPT 10/21/2022, 12:44 PM  [facility] [address] [place], [place], [address] Phone: [phone]   Fax:  [phone]

## 2022-10-21 NOTE — Telephone Encounter (Signed)
PT lvm for assistant in regards to remaining spinal precautions, pt compliance to TLSO wear schedule, and recent change in symptoms down BLE.  Pt needs to reschedule follow-up appt w/ surgeon prior to returning to PT.  Elease Etienne, PT, DPT

## 2022-10-25 ENCOUNTER — Inpatient Hospital Stay: Payer: Medicaid Other | Admitting: Family

## 2022-10-28 ENCOUNTER — Ambulatory Visit: Payer: Medicaid Other | Admitting: Physical Therapy

## 2022-10-28 ENCOUNTER — Encounter: Payer: Self-pay | Admitting: Physical Therapy

## 2022-10-28 DIAGNOSIS — M5459 Other low back pain: Secondary | ICD-10-CM

## 2022-10-28 DIAGNOSIS — M6281 Muscle weakness (generalized): Secondary | ICD-10-CM

## 2022-10-28 DIAGNOSIS — R2689 Other abnormalities of gait and mobility: Secondary | ICD-10-CM

## 2022-10-28 NOTE — Therapy (Signed)
OUTPATIENT PHYSICAL THERAPY NEURO TREATMENT   Patient Name: SHANDEE JERGENS MRN: [id number] DOB:01/25/1956, 67 y.o., female Today's Date: 10/28/2022   PCP: Camillia Herter, NP REFERRING PROVIDER: Barbie Banner, PA-C  END OF SESSION:  10/28/22 1237  PT Visits / Re-Eval  Visit Number 6  Number of Visits 9 (8+eval)  Date for PT Re-Evaluation 11/11/22 (pushed out due to delay in scheduling)  Authorization  Authorization Type Traditional Medicaid  PT Time Calculation  PT Start Time 1233  PT Stop Time 1315  PT Time Calculation (min) 42 min  PT - End of Session  Equipment Utilized During Treatment Gait belt  Activity Tolerance Patient tolerated treatment well  Behavior During Therapy WFL for tasks assessed/performed      Past Medical History:  Diagnosis Date   Anemia    Anxiety    Arthritis    Asthma    Bunion    Callus    Chronic pain    Cocaine abuse (HCC)    COPD (chronic obstructive pulmonary disease) (HCC)    Corns and callosities    Degenerative joint disease    Depression    Diabetes mellitus    Endometrial polyp    ETOH abuse    Gall stones    GERD (gastroesophageal reflux disease)    Headache    history of Migraines   Hepatitis C    Hep C   Hyperlipidemia    Hypertension    Insomnia    Spondylolisthesis of lumbar region    Substance abuse ([place])    alcoholism   Tuberculosis 1985   Wears dentures    Wears glasses    Past Surgical History:  Procedure Laterality Date   ANTERIOR CERVICAL DECOMP/DISCECTOMY FUSION N/A 10/26/2021   Procedure: ACDF - C4-C5 - C5-C6 - C6-C7;  Surgeon: Earnie Larsson, MD;  Location: [place] [facility];  Service: Neurosurgery;  Laterality: N/A;   BACK SURGERY  2018   CESAREAN SECTION     x3   CHOLECYSTECTOMY N/A 08/11/2016   Procedure: LAPAROSCOPIC CHOLECYSTECTOMY WITH   INTRAOPERATIVE CHOLANGIOGRAM;  Surgeon: Stark Klein, MD;  Location: [facility];  Service: General;  Laterality: N/A;   COLONOSCOPY     Cotton Osteotomy w/ Graft Left  06/18/2009   Excision of Benign Lesion Right 01/30/2013   Rt Plantar   FOOT SURGERY     HAMMER TOE REPAIR Right 02/12/2016   RIGHT #5   Hammertoe Repair Left 06/18/2009   Lt #5   HYSTEROSCOPY N/A 06/20/2017   Procedure: DILATION AND CURETTAGE, HYSTEROSCOPY w/ Polypectomy;  Surgeon: Lavonia Drafts, MD;  Location: [facility];  Service: Gynecology;  Laterality: N/A;   LUMBAR FUSION  2018   METATARSAL OSTEOTOMY Left 06/18/2009   #5   MULTIPLE TOOTH EXTRACTIONS     Nail Matrixectomy Left 06/18/2009   LT #1   OSTEOTOMY Right 01/30/2013   Rt #5   Phalangectomy Left 06/18/2009   LT #1   Phalangectomy Right 01/30/2013   Rt #1   TUBAL LIGATION     Patient Active Problem List   Diagnosis Date Noted   Candida vaginitis 09/16/2022   Spinal stenosis, lumbar 08/24/2022   Falls, initial encounter 08/20/2022   Frequent falls 08/20/2022   Leg weakness 08/20/2022   Lumbar stenosis 08/20/2022   Microcytic hypochromic anemia 08/20/2022   Bacterial vaginosis 05/16/2022   Cholelithiasis without obstruction 04/11/2022   Chronic idiopathic constipation 04/11/2022   Gallbladder calculus with acute cholecystitis and no obstruction 04/11/2022   History of adenomatous polyp  of colon 04/11/2022   Morbid obesity (HCC) 04/11/2022   Polysubstance abuse (HCC) 04/11/2022   Right lower quadrant pain 04/11/2022   Type 2 diabetes mellitus (HCC) 04/11/2022   Type II diabetes mellitus (HCC) 04/11/2022   Pharyngoesophageal dysphagia 04/05/2022   Hyperthyroidism 12/29/2021   Cervical spondylosis with myelopathy and radiculopathy 10/26/2021   Bilateral impacted cerumen 09/16/2020   Itching of ear 09/16/2020   Heartburn 07/10/2020   Lumbar radiculopathy 04/03/2020   Hearing loss of right ear 03/12/2020   Plantar flexed metatarsal bone of left foot 08/14/2019   Skin burn 07/09/2019   Uncontrolled type 2 diabetes mellitus with hyperglycemia (HCC) 06/19/2019   Hypertensive retinopathy of both eyes 03/06/2019   Mild  nonproliferative diabetic retinopathy of both eyes (HCC) 03/06/2019   Essential hypertension 10/05/2018   Cramp and spasm 07/20/2018   Lactic acidosis    SIRS (systemic inflammatory response syndrome) (HCC)    Acute renal failure (HCC)    Type 2 diabetes mellitus with stage 3b chronic kidney disease, without long-term current use of insulin (HCC)    Acute blood loss anemia    Sepsis (HCC) 07/17/2017   Hyponatremia 07/17/2017   Postoperative wound infection 07/17/2017   AKI (acute kidney injury) (HCC) 07/17/2017   Dehydration 07/17/2017   DM type 2 (diabetes mellitus, type 2) (HCC) 07/17/2017   Acute encephalopathy 07/17/2017   Degenerative spondylolisthesis 07/03/2017   Post-menopausal bleeding 06/20/2017   Chronic hepatitis C (HCC) 12/09/2015   Inclusion cyst 09/16/2014   Onychomycosis 08/15/2014   Type I (juvenile type) diabetes mellitus with renal manifestations, not stated as uncontrolled(250.41) 06/10/2014   Unspecified constipation 05/22/2014   Gastroesophageal reflux disease without esophagitis 05/22/2014   Pain in lower limb 01/29/2014   Eschar of multiple sites 07/31/2013   Porokeratosis 07/02/2013   Cyst of joint of ankle or foot 06/04/2013   History of endometrial ablation 02/06/2013   Pain in joint, ankle and foot 01/23/2013   Callus of foot 01/23/2013   Ingrown nail 01/23/2013   Deformity of metatarsal 01/23/2013    ONSET DATE: 08/31/2022 (referral date)  REFERRING DIAG: M48.062 (ICD-10-CM) - Spinal stenosis of lumbar region with neurogenic claudication  THERAPY DIAG:  Muscle weakness (generalized)  Other low back pain  Other abnormalities of gait and mobility  Rationale for Evaluation and Treatment: Rehabilitation  SUBJECTIVE:                                                                                                                                                                                             SUBJECTIVE STATEMENT: Pt brings note PT  provided prior for MD to update  precautions (saw Dr. Daiva Eves 10/26/2022), but paper lacks signature or detailed answer to precautions so PT to reach out directly a second time.  Pt states she was told to go without her TLSO and she has not been wearing it.  She further states she fell yesterday when she was trying to make her bed which is really high.  She was using a step stool, but did not push it back far enough and tripped herself on the stool and fell on her knees with the left one hurting worse today.  She was able to get herself up by crawling to the chair in her room to get up.  She ambulates in with her rollator again this visit.  Pt states she has been working on standing up and sitting down, but no other parts of HEP.  Pt states she does not even know where the paper is.  She has recently lost her blood glucose monitor, but states it is somewhere in her bedroom and fell out when she fell yesterday. Pt accompanied by: family member-sister dropped her off today (sister is primary transportation)  PERTINENT HISTORY: GERD, DM II w/ retinopathy, Stage 3b CKD, HTN, right hearing loss, polysubstance abuse, hx of falls  "Presented to [facility] emergency department on 11/10 after reporting several falls and bilateral lower extremity weakness.  She is status post L4-5 and L5-S1 decompression and fusion surgery in 2018 and in 2021, respectively.   She also had C4-C7 ACDF in January 2023 by Dr. Annette Stable.  Transferred to [facility]. Neurosurgery was consulted and MRI performed. She is followed by Dr. Trenton Gammon who noted severe bilateral lower extremity weakness which left her nonambulatory.  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  She had failed conservative management including therapy and IV steroids.  She she was unable to stand or ambulate.  She was also reporting difficulty with bowel and bladder function.  She was taken to the operating room on 11/13 and  underwent bilateral L3-4 decompressive laminectomies and foraminectomies, as well as posterior lumbar interbody fusion and arthrodesis."  PAIN:  Are you having pain? Yes: NPRS scale: 8/10 Pain location: low back and entire left leg Pain description: "pain"; "it's just constant" Aggravating factors: "I can't tell" Relieving factors: "I don't know, I've been trying not to do anything."  PRECAUTIONS: Back, Fall, and Other: TLSO-NO BENDING/LIFTING/TWISTING-PT to reach out to MD regarding wear time as not able to locate in chart, incision is healing but sensitive  WEIGHT BEARING RESTRICTIONS: No  FALLS: Has patient fallen in last 6 months? Yes. Number of falls 3; right leg was giving out when she was using a cane, and general imbalance  LIVING ENVIRONMENT: Lives with: lives alone Lives in: House/apartment Stairs: No Has following equipment at home: Single point cane, Environmental consultant - 2 wheeled, Environmental consultant - 4 wheeled, and shower chair  PLOF: Independent with household mobility with device, Independent with community mobility with device, and Needs assistance with homemaking  PATIENT GOALS: "To get my balance back and be in control of my walking."  OBJECTIVE:   DIAGNOSTIC FINDINGS: Upcoming imaging of lumbar spine scheduled   TODAY'S TREATMENT:                                                                                                                              -  PT assessed LLE prior to activity:  penny sized bruise on lateral aspect of left patella, full active and passive knee and ankle ROM, mild discomfort to MMT w/ scores consistent to eval.  Mild gastroc TTP at superior and lateral muscle head, no notable trigger point or abnormal lumps felt.  No notable edema and no reported sensation changes.  Ongoing hip flexor weakness 3/5 on left. -Reviewed HEP as below to promote safety and pt compliance, reprinted paper for pt.   PATIENT EDUCATION: Education details: Edu on PT reaching out to Dr.  Trenton Gammon again in regards to precautions.  Compliance to HEP. Person educated: Patient Education method: Explanation, Demonstration, and Handouts Education comprehension: verbalized understanding  HOME EXERCISE PROGRAM: Access Code: [email or web address] URL: [email or web address] Date: 09/30/2022 Prepared by: Excell Seltzer  Exercises - Side Stepping with Resistance at Ankles and Counter Support  - 1 x daily - 7 x weekly - 3 sets - 10 reps - Forward Monster Walk with Resistance at Ankles and Counter Support  - 1 x daily - 7 x weekly - 3 sets - 10 reps - Forward Step Up with Counter Support  - 1 x daily - 7 x weekly - 3 sets - 10 reps - Standing Single Leg Stance with Counter Support  - 1 x daily - 7 x weekly - 1 sets - 10 reps - 30 sec hold - Standing Tandem Balance with Counter Support  - 1 x daily - 7 x weekly - 1 sets - 10 reps - 30 sec hold   GOALS: Goals reviewed with patient? Yes  SHORT TERM GOALS: Target date: 10/07/2022  Pt will be independent with strength and balance HEP. Baseline:  Compliant and needs advancement. Goal status: MET  2.  Pt will report pain at rest of </= 5/10 in low back and legs to demonstrate subjective improvement in quality of life. Baseline: 7/10; 6/10 (12/29) Goal status: IN PROGRESS  3.  Pt will decrease 5xSTS to </=13 seconds w/o UE support in order to demonstrate decreased risk for falls and improved functional bilateral LE strength and power. Baseline: 16.13 sec w/ hands on knees; 11.69 sec w/ BUE support (12/29) Goal status: IN PROGRESS  4.  Pt will improve gait velocity to at least 2.0 ft/sec for improved gait efficiency and performance at mod I level  Baseline: 1.68 ft/sec (12/8); 3.02 ft/sec w/ SPC (12/29) Goal status: MET  5.  Pt will improve Berg score to 41/56 for decreased fall risk Baseline: 36/56 (12/8); 42/56 (12/29) Goal status: MET  6.  DGI to be assessed at STG deadline if appropriate w/ LTG set. Baseline: 11/24  (12/8) Goal status: MET  LONG TERM GOALS: Target date: 11/04/2022  Pt will maintain walking program greater than or equal to 4 days a week to increase ambulatory tolerance and general aerobic capacity. Baseline: To be established. Goal status: INITIAL  2.  Pt will ambulate >/= 600 feet on 6MWT using LRAD to demonstrate improved functional endurance for home and community participation. Baseline: 52' w/ rollator Goal status: INITIAL  3. Pt will improve gait velocity to at least 3.3 ft/sec for improved gait efficiency and performance at mod I level  Baseline: 1.68 ft/sec (12/8); 3.02 ft/sec w/ SPC (12/29) Goal status: REVISED  4.  Pt will improve Berg score to 47/56 for decreased fall risk Baseline: 36/56 (12/8) Goal status: INITIAL  5.  Pt will improve DGI to 19/24 for decreased fall risk  Baseline: 11/24 (12/8) Goal status: INITIAL  ASSESSMENT:  CLINICAL IMPRESSION: Pt has had a recent set back returning to ambulation w/ rollator vs SPC and endorsing symptoms into her posterior lower legs, but has had follow-up with surgeon for these concerns and lingering precautions.  Paper that PT provided not signed by MD so PT will reach back out to MD for confirmation for re-cert.  Plan to assess LTGs and re-cert patient next visit.  At current pt has not been compliant to HEP so reviewed this session w/ additional printed copy provided to patient.   OBJECTIVE IMPAIRMENTS: Abnormal gait, decreased activity tolerance, decreased balance, decreased endurance, decreased strength, improper body mechanics, postural dysfunction, and pain.   ACTIVITY LIMITATIONS: carrying, lifting, bending, squatting, stairs, transfers, and locomotion level  PARTICIPATION LIMITATIONS: meal prep, cleaning, laundry, driving, shopping, and community activity  PERSONAL FACTORS: Age, Fitness, Past/current experiences, Time since onset of injury/illness/exacerbation, and 1-2 comorbidities: HTN, CKD  are also affecting  patient's functional outcome.   REHAB POTENTIAL: Good  CLINICAL DECISION MAKING: Evolving/moderate complexity  EVALUATION COMPLEXITY: Moderate  PLAN:  PT FREQUENCY: 1x/week (will likely inc to 2x/week at re-cert as pt comes out of brace)  PT DURATION: 8 weeks  PLANNED INTERVENTIONS: Therapeutic exercises, Therapeutic activity, Neuromuscular re-education, Balance training, Gait training, Patient/Family education, Self Care, Stair training, Vestibular training, Orthotic/Fit training, DME instructions, scar mobilization, Manual therapy, and Re-evaluation  PLAN FOR NEXT SESSION: Advance HEP- STS no UE support; If precautions lifted could do core on mat.  Assess LTGs-re-cert.   Sadie Haber, PT, DPT 10/28/2022, 12:42 PM

## 2022-10-31 ENCOUNTER — Ambulatory Visit: Payer: Self-pay | Admitting: *Deleted

## 2022-10-31 NOTE — Telephone Encounter (Signed)
  Chief Complaint: Right earache Symptoms: For 5 days pain in right ear not getting better.   Hurts into her neck when she yawns. Frequency: Started 5 days ago Pertinent Negatives: Patient denies drainage or fever or URI symptoms Disposition: [] ED /[x] Urgent Care (no appt availability in office) / [] Appointment(In office/virtual)/ []  [place] [place] [facility]/ [] Home Care/ [] Refused Recommended Disposition /[] [place] [facility]/ []  Follow-up with PCP Additional Notes: No appts. Available at Primary Care at [facility] so she is agreeable to going to the urgent care.

## 2022-10-31 NOTE — Telephone Encounter (Signed)
Reason for Disposition  Earache  (Exceptions: brief ear pain of < 60 minutes duration, earache occurring during air travel  Answer Assessment - Initial Assessment Questions 1. LOCATION: "Which ear is involved?"     Right ear has pain in it.  When I yawn it hurts into my neck.  chewing hurts 2. ONSET: "When did the ear start hurting"      5 days ago  3. SEVERITY: "How bad is the pain?"  (Scale 1-10; mild, moderate or severe)   - MILD (1-3): doesn't interfere with normal activities    - MODERATE (4-7): interferes with normal activities or awakens from sleep    - SEVERE (8-10): excruciating pain, unable to do any normal activities      Moderate  4. URI SYMPTOMS: "Do you have a runny nose or cough?"     No to all the above 5. FEVER: "Do you have a fever?" If Yes, ask: "What is your temperature, how was it measured, and when did it start?"     No 6. CAUSE: "Have you been swimming recently?", "How often do you use Q-TIPS?", "Have you had any recent air travel or scuba diving?"     No to above 7. OTHER SYMPTOMS: "Do you have any other symptoms?" (e.g., headache, stiff neck, dizziness, vomiting, runny nose, decreased hearing)     No 8. PREGNANCY: "Is there any chance you are pregnant?" "When was your last menstrual period?"     N/A  Protocols used: Bethann Punches

## 2022-10-31 NOTE — Telephone Encounter (Signed)
Noted  

## 2022-11-04 ENCOUNTER — Telehealth: Payer: Self-pay | Admitting: Physical Therapy

## 2022-11-04 ENCOUNTER — Ambulatory Visit: Payer: Medicaid Other | Admitting: Physical Therapy

## 2022-11-04 NOTE — Telephone Encounter (Signed)
LVM w/ Dr. Marchelle Folks [name] regarding confirmation of lifting spinal precautions and TLSO wear schedule as presented by patient at last visit in addition to brief follow-up about pt progression of symptoms.  Pt to return to clinic following receipt of this information.  Provided clinic number for call back.  Elease Etienne, PT, DPT

## 2022-11-07 ENCOUNTER — Other Ambulatory Visit: Payer: Self-pay | Admitting: Family

## 2022-11-07 DIAGNOSIS — Z794 Long term (current) use of insulin: Secondary | ICD-10-CM

## 2022-11-07 NOTE — Telephone Encounter (Signed)
Requested Prescriptions  Pending Prescriptions Disp Refills   metFORMIN (GLUCOPHAGE-XR) 500 MG 24 hr tablet [Pharmacy Med Name: METFORMIN ER 500MG  24HR TABS] 90 tablet 1    Sig: TAKE 1 TABLET(500 MG) BY MOUTH DAILY WITH BREAKFAST     Endocrinology:  Diabetes - Biguanides Failed - 11/07/2022  8:04 AM      Failed - B12 Level in normal range and within 720 days    Vitamin B-12  Date Value Ref Range Status  07/19/2017 1,450 (H) 180 - 914 pg/mL Final    Comment:    (NOTE) This assay is not validated for testing neonatal or myeloproliferative syndrome specimens for Vitamin B12 levels.          Passed - Cr in normal range and within 360 days    Creat  Date Value Ref Range Status  09/23/2019 1.32 (H) 0.50 - 0.99 mg/dL Final    Comment:    For patients >70 years of age, the reference limit for Creatinine is approximately 13% higher for people identified as African-American. .    Creatinine, Ser  Date Value Ref Range Status  08/29/2022 0.92 0.44 - 1.00 mg/dL Final   Creatinine, POC  Date Value Ref Range Status  06/23/2020 100 mg/dL Final   Creatinine, Urine  Date Value Ref Range Status  07/17/2017 65.11 mg/dL Final    Comment:    Performed at [facility], [place] [address] [place], [place] [address]         Passed - HBA1C is between 0 and 7.9 and within 180 days    Hgb A1c MFr Bld  Date Value Ref Range Status  08/20/2022 7.4 (H) 4.8 - 5.6 % Final    Comment:    (NOTE) Pre diabetes:          5.7%-6.4%  Diabetes:              >6.4%  Glycemic control for   <7.0% adults with diabetes          Passed - eGFR in normal range and within 360 days    GFR, Est African American  Date Value Ref Range Status  09/23/2019 50 (L) > OR = 60 mL/min/1.53m2 Final   GFR calc Af Amer  Date Value Ref Range Status  11/26/2020 55 (L) >59 mL/min/1.73 Final    Comment:    **In accordance with recommendations from the NKF-ASN Task force,**   [facility] is in the process of updating its  eGFR calculation to the   2021 CKD-EPI creatinine equation that estimates kidney function   without a race variable.    GFR, Est Non African American  Date Value Ref Range Status  09/23/2019 43 (L) > OR = 60 mL/min/1.16m2 Final   GFR, Estimated  Date Value Ref Range Status  08/29/2022 >60 >60 mL/min Final    Comment:    (NOTE) Calculated using the CKD-EPI Creatinine Equation (2021)    eGFR  Date Value Ref Range Status  05/13/2022 72 >59 mL/min/1.73 Final         Passed - Valid encounter within last 6 months    Recent Outpatient Visits           1 month ago Hospital discharge follow-up   [facility], Amy J, NP   2 months ago Acute right-sided low back pain, unspecified whether sciatica present   [facility], Amy J, NP   5 months ago  Chronic abdominal pain   [place] [facility], Amy J, NP   8 months ago Type 2 diabetes mellitus with stage 3b chronic kidney disease, with long-term current use of insulin ([place])   [place] [facility], Cari S, PA-C   10 months ago Annual physical exam   [place] [facility], [place], NP       Future Appointments             In 1 month Camillia Herter, NP [facility] - CBC within normal limits and completed in the last 12 months    WBC  Date Value Ref Range Status  08/29/2022 8.9 4.0 - 10.5 K/uL Final   RBC  Date Value Ref Range Status  08/29/2022 3.21 (L) 3.87 - 5.11 MIL/uL Final   Hemoglobin  Date Value Ref Range Status  08/29/2022 7.7 (L) 12.0 - 15.0 g/dL Final    Comment:    Reticulocyte Hemoglobin testing may be clinically indicated, consider ordering this additional test [id number]   05/13/2022 10.5 (L) 11.1 - 15.9 g/dL Final   HCT  Date Value Ref Range Status  08/29/2022 23.3 (L) 36.0 - 46.0 % Final    Hematocrit  Date Value Ref Range Status  05/13/2022 31.8 (L) 34.0 - 46.6 % Final   MCHC  Date Value Ref Range Status  08/29/2022 33.0 30.0 - 36.0 g/dL Final   [facility]  Date Value Ref Range Status  08/29/2022 24.0 (L) 26.0 - 34.0 pg Final   MCV  Date Value Ref Range Status  08/29/2022 72.6 (L) 80.0 - 100.0 fL Final  05/13/2022 73 (L) 79 - 97 fL Final   No results found for: "PLTCOUNTKUC", "LABPLAT", "POCPLA" RDW  Date Value Ref Range Status  08/29/2022 15.0 11.5 - 15.5 % Final  05/13/2022 15.2 11.7 - 15.4 % Final

## 2022-11-09 ENCOUNTER — Ambulatory Visit (INDEPENDENT_AMBULATORY_CARE_PROVIDER_SITE_OTHER): Payer: Medicaid Other | Admitting: Family

## 2022-11-09 ENCOUNTER — Ambulatory Visit: Payer: Self-pay | Admitting: *Deleted

## 2022-11-09 ENCOUNTER — Encounter: Payer: Self-pay | Admitting: Family

## 2022-11-09 ENCOUNTER — Telehealth: Payer: Self-pay | Admitting: Family

## 2022-11-09 VITALS — BP 176/108 | HR 76 | Ht 59.0 in | Wt 193.0 lb

## 2022-11-09 DIAGNOSIS — M79601 Pain in right arm: Secondary | ICD-10-CM

## 2022-11-09 DIAGNOSIS — M79602 Pain in left arm: Secondary | ICD-10-CM | POA: Diagnosis not present

## 2022-11-09 MED ORDER — TRIAMCINOLONE ACETONIDE 40 MG/ML IJ SUSP
40.0000 mg | Freq: Once | INTRAMUSCULAR | Status: AC
  Administered 2022-11-09: 40 mg via INTRAMUSCULAR

## 2022-11-09 NOTE — Telephone Encounter (Signed)
Copied from [place] [id number]. Topic: General - Inquiry >> Nov 07, 2022  5:57 PM Leilani Able wrote: Reason for CRM: Pt daughter Lynne Leader daughter is very upset her mother did not get an appt until March 1.Not on DPR, She states that her mother is in a lot of pain and this is an emergency and she needs an appt now, I tried repeatedly to offer a nurse  to get clinical notes to expedite appt and speak to mother but she said she did not want to talk to anyone but Durene Fruits. Daughter literally was screaming in to the phone as I was trying to offer her NT and it just made her livid. She did not want to give any details re a note stating that only Amy needed details. She did mention that it was maybe due to her surgery on her neck. Daughter's phone was [phone] She states she is going to file a formal complaint against the office if call is not returned asap.She did say if she did not answer to call pt!!!

## 2022-11-09 NOTE — Telephone Encounter (Signed)
Patient has been scheduled by office- no need for call back at this time

## 2022-11-09 NOTE — Telephone Encounter (Signed)
PEC NT will reach out to triage the patient for the arm pain.

## 2022-11-09 NOTE — Progress Notes (Signed)
Pt here for bilateral arm pain, she states pain radiates down to fingers and up towards her shoulder blades, pain has worsened in the past 2 weeks

## 2022-11-09 NOTE — Telephone Encounter (Signed)
Returning pt's call to schedule next available w/ PCP for arm pain. Called pt 2xs, No answer, Mailbox full and cannot LVM.

## 2022-11-09 NOTE — Telephone Encounter (Signed)
Daughter has called with concerns about patient arm pain- requesting appointment for evaluation.  Attempt to contact patient to triage/assess and schedule. No answer- mailbox is full- unable to leave message- x2

## 2022-11-09 NOTE — Progress Notes (Signed)
Patient ID: BURNELL MATLIN, female    DOB: 04/25/1956  MRN: [id number]  CC: Arm Pain  Subjective: Kara Kelley is a 67 y.o. female who presents for arm pain. Her sister is waiting in the lobby.   Her concerns today include:  Patient reports bilateral arm pain x 2 weeks. Pain radiates down to fingers and up towards her shoulder blades with limited range of motion. Denies recent trauma/injury. Reports pain interrupting sleep. She denies red flag symptoms associated with arm pain. She has tried several over-the-counter medications without relief. Blood pressure elevated today, she denies red flag symptoms. This is likely secondary to bilateral arm pain. No further issues/concerns for today.  Patient Active Problem List   Diagnosis Date Noted   Candida vaginitis 09/16/2022   Spinal stenosis, lumbar 08/24/2022   Falls, initial encounter 08/20/2022   Frequent falls 08/20/2022   Leg weakness 08/20/2022   Lumbar stenosis 08/20/2022   Microcytic hypochromic anemia 08/20/2022   Bacterial vaginosis 05/16/2022   Cholelithiasis without obstruction 04/11/2022   Chronic idiopathic constipation 04/11/2022   Gallbladder calculus with acute cholecystitis and no obstruction 04/11/2022   History of adenomatous polyp of colon 04/11/2022   Morbid obesity ([place]) 04/11/2022   Polysubstance abuse ([place]) 04/11/2022   Right lower quadrant pain 04/11/2022   Type 2 diabetes mellitus ([place]) 04/11/2022   Type II diabetes mellitus ([place]) 04/11/2022   Pharyngoesophageal dysphagia 04/05/2022   Hyperthyroidism 12/29/2021   Cervical spondylosis with myelopathy and radiculopathy 10/26/2021   Bilateral impacted cerumen 09/16/2020   Itching of ear 09/16/2020   Heartburn 07/10/2020   Lumbar radiculopathy 04/03/2020   Hearing loss of right ear 03/12/2020   Plantar flexed metatarsal bone of left foot 08/14/2019   Skin burn 07/09/2019   Uncontrolled type 2 diabetes mellitus with hyperglycemia ([place]) 06/19/2019    Hypertensive retinopathy of both eyes 03/06/2019   Mild nonproliferative diabetic retinopathy of both eyes ([place]) 03/06/2019   Essential hypertension 10/05/2018   Cramp and spasm 07/20/2018   Lactic acidosis    SIRS (systemic inflammatory response syndrome) ([place])    Acute renal failure ([place])    Type 2 diabetes mellitus with stage 3b chronic kidney disease, without long-term current use of insulin ([place])    Acute blood loss anemia    Sepsis ([place]) 07/17/2017   Hyponatremia 07/17/2017   Postoperative wound infection 07/17/2017   AKI (acute kidney injury) ([place]) 07/17/2017   Dehydration 07/17/2017   DM type 2 (diabetes mellitus, type 2) ([place]) 07/17/2017   Acute encephalopathy 07/17/2017   Degenerative spondylolisthesis 07/03/2017   Post-menopausal bleeding 06/20/2017   Chronic hepatitis C ([place]) 12/09/2015   Inclusion cyst 09/16/2014   Onychomycosis 08/15/2014   Type I (juvenile type) diabetes mellitus with renal manifestations, not stated as uncontrolled(250.41) 06/10/2014   Unspecified constipation 05/22/2014   Gastroesophageal reflux disease without esophagitis 05/22/2014   Pain in lower limb 01/29/2014   Eschar of multiple sites 07/31/2013   Porokeratosis 07/02/2013   Cyst of joint of ankle or foot 06/04/2013   History of endometrial ablation 02/06/2013   Pain in joint, ankle and foot 01/23/2013   Callus of foot 01/23/2013   Ingrown nail 01/23/2013   Deformity of metatarsal 01/23/2013     Current Outpatient Medications on File Prior to Visit  Medication Sig Dispense Refill   Accu-Chek FastClix Lancets MISC Use as directed to check blood sugars 2 times per day dx: e11.65 102 each prn   Accu-Chek Softclix Lancets lancets Use as instructed to check blood  sugars twice daily E11.69 100 each 2   acetaminophen (TYLENOL) 325 MG tablet Take 1-2 tablets (325-650 mg total) by mouth every 4 (four) hours as needed for mild pain.     albuterol (VENTOLIN HFA) 108 (90 Base) MCG/ACT inhaler Inhale  2 puffs into the lungs every 6 (six) hours as needed for wheezing or shortness of breath. (Patient taking differently: Inhale 2 puffs into the lungs 2 (two) times daily as needed for wheezing or shortness of breath.) 18 g 2   ARIPiprazole (ABILIFY) 10 MG tablet Take 10 mg by mouth daily.      atorvastatin (LIPITOR) 10 MG tablet Take 1 tablet (10 mg total) by mouth daily. 30 tablet 0   Blood Glucose Monitoring Suppl (ACCU-CHEK GUIDE ME) w/Device KIT Use to check blood sugars twice daily E11.69 1 kit 1   buPROPion (WELLBUTRIN XL) 300 MG 24 hr tablet Take 1 tablet (300 mg total) by mouth daily. (Patient taking differently: Take 300 mg by mouth in the morning and at bedtime.) 90 tablet 0   diclofenac Sodium (VOLTAREN) 1 % GEL Apply 4 g topically 4 (four) times daily.     fluticasone furoate-vilanterol (BREO ELLIPTA) 200-25 MCG/ACT AEPB May finish hospital dispensed container, then resume home Advair as prescribed.     fluticasone-salmeterol (ADVAIR DISKUS) 250-50 MCG/ACT AEPB Inhale 1 puff into the lungs in the morning and at bedtime. 60 each 6   gabapentin (NEURONTIN) 100 MG capsule Take 2 capsules (200 mg total) by mouth 3 (three) times daily. 90 capsule 0   Glucagon (GVOKE HYPOPEN 2-PACK) 1 MG/0.2ML SOAJ Inject 1 each into the skin as needed. If blood sugar less than 60 0.4 mL 3   glucose blood (ACCU-CHEK GUIDE) test strip Use as directed to check blood sugars 2 times per day dx: e11.69 300 strip 2   hydrOXYzine (ATARAX) 25 MG tablet Take 1 tablet (25 mg total) by mouth every 8 (eight) hours as needed for anxiety. 30 tablet 1   insulin aspart (NOVOLOG) 100 UNIT/ML FlexPen Inject 5 Units into the skin 3 (three) times daily with meals. 15 mL 0   insulin detemir (LEVEMIR FLEXTOUCH) 100 UNIT/ML FlexPen Inject 15 Units into the skin 2 (two) times daily. 15 mL 0   Insulin Pen Needle (PEN NEEDLES) 31G X 8 MM MISC UAD 100 each 0   Insulin Syringe-Needle U-100 (INSULIN SYRINGE .3CC/30GX5/16") 30G X 5/16" 0.3 ML  MISC Use to inject Levemir twice daily. 100 each 2   Iron, Ferrous Sulfate, 325 (65 Fe) MG TABS Take 325 mg by mouth daily. 30 tablet 2   LINZESS 145 MCG CAPS capsule Take 145 mcg by mouth daily before breakfast.      methimazole (TAPAZOLE) 5 MG tablet Take 1 tablet (5 mg total) by mouth daily. 90 tablet 1   methocarbamol (ROBAXIN) 500 MG tablet Take 1 tablet (500 mg total) by mouth every 6 (six) hours as needed for muscle spasms. 60 tablet 1   Multiple Vitamins-Minerals (MULTIVITAMIN WITH MINERALS) tablet Take 1 tablet by mouth daily. Alive     omeprazole (PRILOSEC) 40 MG capsule TAKE 1 CAPSULE(40 MG) BY MOUTH DAILY BEFORE BREAKFAST 90 capsule 1   polyethylene glycol (MIRALAX / GLYCOLAX) 17 g packet Take 17 g by mouth 2 (two) times daily. 14 each 0   promethazine (PHENERGAN) 25 MG tablet Take 1 tablet (25 mg total) by mouth every 8 (eight) hours as needed for nausea or vomiting. 15 tablet 0   Semaglutide, 1 MG/DOSE, (OZEMPIC,  1 MG/DOSE,) 4 MG/3ML SOPN Inject 1 mg into the skin once a week. 9 mL 0   senna-docusate (SENOKOT-S) 8.6-50 MG tablet Take 1 tablet by mouth 2 (two) times daily. 30 tablet 0   traZODone (DESYREL) 100 MG tablet Take 1.5 tablets (150 mg total) by mouth at bedtime as needed for sleep. 30 tablet 0   VITAMIN D PO Take 1 capsule by mouth daily.     No current facility-administered medications on file prior to visit.    No Known Allergies  Social History   Socioeconomic History   Marital status: Single    Spouse name: Not on file   Number of children: 3   Years of education: Not on file   Highest education level: Not on file  Occupational History   Occupation: disablily  Tobacco Use   Smoking status: Former    Packs/day: 0.75    Years: 47.00    Total pack years: 35.25    Types: Cigarettes    Quit date: 2020    Years since quitting: 4.0    Passive exposure: Past   Smokeless tobacco: Never  Vaping Use   Vaping Use: Never used  Substance and Sexual Activity    Alcohol use: No    Alcohol/week: 0.0 standard drinks of alcohol    Comment: in rehab   Drug use: No    Comment: in rehab   Sexual activity: Not Currently    Birth control/protection: Post-menopausal  Other Topics Concern   Not on file  Social History Narrative   Not on file   Social Determinants of Health   Financial Resource Strain: Medium Risk (06/23/2020)   Overall Financial Resource Strain (CARDIA)    Difficulty of Paying Living Expenses: Somewhat hard  Food Insecurity: No Food Insecurity (03/27/2020)   Hunger Vital Sign    Worried About Running Out of Food in the Last Year: Never true    Ran Out of Food in the Last Year: Never true  Transportation Needs: No Transportation Needs (03/27/2020)   PRAPARE - Hydrologist (Medical): No    Lack of Transportation (Non-Medical): No  Physical Activity: Not on file  Stress: Not on file  Social Connections: Not on file  Intimate Partner Violence: Not on file    Family History  Problem Relation Age of Onset   Heart disease Mother    Diabetes Other        mat great aunt   Cirrhosis Other        mat great aunt   Cirrhosis Other        mat great uncles x 2   Colon cancer Neg Hx    Breast cancer Neg Hx     Past Surgical History:  Procedure Laterality Date   ANTERIOR CERVICAL DECOMP/DISCECTOMY FUSION N/A 10/26/2021   Procedure: ACDF - C4-C5 - C5-C6 - C6-C7;  Surgeon: Earnie Larsson, MD;  Location: [facility];  Service: Neurosurgery;  Laterality: N/A;   BACK SURGERY  2018   CESAREAN SECTION     x3   CHOLECYSTECTOMY N/A 08/11/2016   Procedure: LAPAROSCOPIC CHOLECYSTECTOMY WITH   INTRAOPERATIVE CHOLANGIOGRAM;  Surgeon: Stark Klein, MD;  Location: [facility];  Service: General;  Laterality: N/A;   COLONOSCOPY     [place] Osteotomy w/ Graft Left 06/18/2009   Excision of Benign Lesion Right 01/30/2013   Rt Plantar   FOOT SURGERY     HAMMER TOE REPAIR Right 02/12/2016   RIGHT #5   [place]  Repair Left 06/18/2009   Lt  #5   HYSTEROSCOPY N/A 06/20/2017   Procedure: DILATION AND CURETTAGE, HYSTEROSCOPY w/ Polypectomy;  Surgeon: Lavonia Drafts, MD;  Location: [facility];  Service: Gynecology;  Laterality: N/A;   LUMBAR FUSION  2018   METATARSAL OSTEOTOMY Left 06/18/2009   #5   MULTIPLE TOOTH EXTRACTIONS     Nail Matrixectomy Left 06/18/2009   LT #1   OSTEOTOMY Right 01/30/2013   Rt #5   Phalangectomy Left 06/18/2009   LT #1   Phalangectomy Right 01/30/2013   Rt #1   TUBAL LIGATION      ROS: Review of Systems Negative except as stated above  PHYSICAL EXAM: BP (!) 176/108 (BP Location: Left Arm, Patient Position: Sitting, Cuff Size: Normal)   Pulse 76   Ht 4\' 11"  (1.499 m)   Wt 193 lb (87.5 kg)   SpO2 96%   BMI 38.98 kg/m   Physical Exam HENT:     Head: Normocephalic and atraumatic.  Eyes:     Extraocular Movements: Extraocular movements intact.     Conjunctiva/sclera: Conjunctivae normal.     Pupils: Pupils are equal, round, and reactive to light.  Cardiovascular:     Rate and Rhythm: Normal rate and regular rhythm.     Pulses: Normal pulses.     Heart sounds: Normal heart sounds.  Pulmonary:     Effort: Pulmonary effort is normal.     Breath sounds: Normal breath sounds.  Musculoskeletal:     Right shoulder: Decreased range of motion.     Left shoulder: Decreased range of motion.     Right upper arm: Normal.     Left upper arm: Normal.     Right elbow: Normal.     Left elbow: Normal.     Right forearm: Normal.     Left forearm: Normal.     Right wrist: Normal.     Left wrist: Normal.     Right hand: Normal.     Left hand: Normal.     Cervical back: Normal range of motion and neck supple.  Neurological:     General: No focal deficit present.     Mental Status: She is alert and oriented to person, place, and time.  Psychiatric:        Mood and Affect: Mood normal.        Behavior: Behavior normal.     ASSESSMENT AND PLAN: 1. Bilateral arm pain - Triamcinolone acetonide  injection administered in office.  - Referral to Orthopedic Surgery for further evaluation/management.  - Follow-up with primary provider as scheduled.  - triamcinolone acetonide (KENALOG-40) injection 40 mg - Ambulatory referral to Orthopedic Surgery   Patient was given the opportunity to ask questions.  Patient verbalized understanding of the plan and was able to repeat key elements of the plan. Patient was given clear instructions to go to Emergency Department or return to medical center if symptoms don't improve, worsen, or new problems develop.The patient verbalized understanding.   Orders Placed This Encounter  Procedures   Ambulatory referral to Orthopedic Surgery   Follow-up with primary provider as scheduled.   Camillia Herter, NP

## 2022-11-09 NOTE — Telephone Encounter (Signed)
Patient scheduled today as per [place],  - [facility].

## 2022-11-10 ENCOUNTER — Ambulatory Visit: Payer: Self-pay | Admitting: *Deleted

## 2022-11-10 NOTE — Telephone Encounter (Signed)
Summary: Pt having pain in arm from shot   Pt stated she got a shot in the arm on yesterday and she is concerned because the pain has not gone away. Pt requests call back to discuss. Cb# [phone]       Chief Complaint: right arm pain after receiving injection kenolog 40 mg  Symptoms: right arm pain persists Frequency: 11/09/22 Pertinent Negatives: Patient denies fever  Disposition: [x] ED /[] Urgent Care (no appt availability in office) / [] Appointment(In office/virtual)/ []  [facility]/ [] Home Care/ [] Refused Recommended Disposition /[] [facility]/ []  Follow-up with PCP Additional Notes:   Recommended ED due to no appt. Patient reports she is not going to ED and sit for hours. Please advise. advise for pain management. Recommended to apply heat for 20 minutes  and remove . Can put pillow under arm for support as needed.     Reason for Disposition  [1] SEVERE pain AND [2] not improved 2 hours after pain medicine  Answer Assessment - Initial Assessment Questions 1. ONSET: "When did the pain start?"     Pain continues after receiving injections of kenolog 40 mg  2. LOCATION: "Where is the pain located?"     Right shoulder/ arm  3. PAIN: "How bad is the pain?" (Scale 1-10; or mild, moderate, severe)   - MILD (1-3): Doesn't interfere with normal activities.   - MODERATE (4-7): Interferes with normal activities (e.g., work or school) or awakens from sleep.   - SEVERE (8-10): Excruciating pain, unable to do any normal activities, unable to hold a cup of water.     Pain persists  4. WORK OR EXERCISE: "Has there been any recent work or exercise that involved this part of the body?"     na 5. CAUSE: "What do you think is causing the arm pain?"     Injection  6. OTHER SYMPTOMS: "Do you have any other symptoms?" (e.g., neck pain, swelling, rash, fever, numbness, weakness)     Pain in arm  7. PREGNANCY: "Is there any chance you are pregnant?" "When was your last  menstrual period?"     na  Protocols used: Arm Pain-A-AH

## 2022-11-11 NOTE — Telephone Encounter (Signed)
This encounter was created in error - please disregard.

## 2022-11-11 NOTE — Telephone Encounter (Signed)
  Chief Complaint: arm pain  Symptoms: R arm pain 10/10, swelling  Frequency: since 11/09/22, swelling noticed today Pertinent Negatives: NA Disposition: [] ED /[x] Urgent Care (no appt availability in office) / [] Appointment(In office/virtual)/ []  [facility]/ [] Home Care/ [] Refused Recommended Disposition /[] [facility]/ []  Follow-up with PCP Additional Notes: pt calling back saying that she now has swelling in her R arm where she had injection given and pain 10/10. No appts with practice d/t closed, offered UC appt, scheduled for 0900 on 11/12/22. Pt states she has taken Tylenol and Ibuprofen and applied heat for pain but no relief. Encouraged to go to ED if sx get worse. Pt verbalized understanding.

## 2022-11-12 ENCOUNTER — Other Ambulatory Visit: Payer: Self-pay | Admitting: Physician Assistant

## 2022-11-12 ENCOUNTER — Ambulatory Visit: Payer: Self-pay

## 2022-11-14 DIAGNOSIS — M5412 Radiculopathy, cervical region: Secondary | ICD-10-CM | POA: Diagnosis not present

## 2022-11-24 DIAGNOSIS — M4310 Spondylolisthesis, site unspecified: Secondary | ICD-10-CM | POA: Diagnosis not present

## 2022-11-24 DIAGNOSIS — Z6835 Body mass index (BMI) 35.0-35.9, adult: Secondary | ICD-10-CM | POA: Diagnosis not present

## 2022-11-28 ENCOUNTER — Encounter: Payer: Self-pay | Admitting: Pharmacist

## 2022-12-01 DIAGNOSIS — R200 Anesthesia of skin: Secondary | ICD-10-CM | POA: Diagnosis not present

## 2022-12-01 DIAGNOSIS — M79601 Pain in right arm: Secondary | ICD-10-CM | POA: Diagnosis not present

## 2022-12-01 DIAGNOSIS — M1812 Unilateral primary osteoarthritis of first carpometacarpal joint, left hand: Secondary | ICD-10-CM | POA: Diagnosis not present

## 2022-12-01 DIAGNOSIS — M1811 Unilateral primary osteoarthritis of first carpometacarpal joint, right hand: Secondary | ICD-10-CM | POA: Diagnosis not present

## 2022-12-06 NOTE — Progress Notes (Signed)
Patient ID: Kara Kelley, female    DOB: 07-Sep-1956  MRN: [id number]  CC: Chronic Care Management   Subjective: Kara Kelley is a 67 y.o. female who presents for chronic care management. She is accompanied by her daughter.  Her concerns today include:  - Doing well on blood pressure and cholesterol medications, no issues/concerns. She denies red flag symptoms such as but not limited to chest pain, shortness of breath, and worst headache of life. - Requests refills on Ozempic. States she has not heard from Endocrinology referral as of yet.  - Requests refills on Promethazine for nausea. She denies additional symptoms.  Patient Active Problem List   Diagnosis Date Noted   Candida vaginitis 09/16/2022   Spinal stenosis, lumbar 08/24/2022   Falls, initial encounter 08/20/2022   Frequent falls 08/20/2022   Leg weakness 08/20/2022   Lumbar stenosis 08/20/2022   Microcytic hypochromic anemia 08/20/2022   Bacterial vaginosis 05/16/2022   Cholelithiasis without obstruction 04/11/2022   Chronic idiopathic constipation 04/11/2022   Gallbladder calculus with acute cholecystitis and no obstruction 04/11/2022   History of adenomatous polyp of colon 04/11/2022   Morbid obesity ([place]) 04/11/2022   Polysubstance abuse ([place]) 04/11/2022   Right lower quadrant pain 04/11/2022   Type 2 diabetes mellitus ([place]) 04/11/2022   Type II diabetes mellitus ([place]) 04/11/2022   Pharyngoesophageal dysphagia 04/05/2022   Hyperthyroidism 12/29/2021   Cervical spondylosis with myelopathy and radiculopathy 10/26/2021   Bilateral impacted cerumen 09/16/2020   Itching of ear 09/16/2020   Heartburn 07/10/2020   Lumbar radiculopathy 04/03/2020   Hearing loss of right ear 03/12/2020   Plantar flexed metatarsal bone of left foot 08/14/2019   Skin burn 07/09/2019   Uncontrolled type 2 diabetes mellitus with hyperglycemia ([place]) 06/19/2019   Hypertensive retinopathy of both eyes 03/06/2019   Mild  nonproliferative diabetic retinopathy of both eyes ([place]) 03/06/2019   Essential hypertension 10/05/2018   Cramp and spasm 07/20/2018   Lactic acidosis    SIRS (systemic inflammatory response syndrome) ([place])    Acute renal failure ([place])    Type 2 diabetes mellitus with stage 3b chronic kidney disease, without long-term current use of insulin ([place])    Acute blood loss anemia    Sepsis ([place]) 07/17/2017   Hyponatremia 07/17/2017   Postoperative wound infection 07/17/2017   AKI (acute kidney injury) ([place]) 07/17/2017   Dehydration 07/17/2017   DM type 2 (diabetes mellitus, type 2) ([place]) 07/17/2017   Acute encephalopathy 07/17/2017   Degenerative spondylolisthesis 07/03/2017   Post-menopausal bleeding 06/20/2017   Chronic hepatitis C ([place]) 12/09/2015   Inclusion cyst 09/16/2014   Onychomycosis 08/15/2014   Type I (juvenile type) diabetes mellitus with renal manifestations, not stated as uncontrolled(250.41) 06/10/2014   Unspecified constipation 05/22/2014   Gastroesophageal reflux disease without esophagitis 05/22/2014   Pain in lower limb 01/29/2014   Eschar of multiple sites 07/31/2013   Porokeratosis 07/02/2013   Cyst of joint of ankle or foot 06/04/2013   History of endometrial ablation 02/06/2013   Pain in joint, ankle and foot 01/23/2013   Callus of foot 01/23/2013   Ingrown nail 01/23/2013   Deformity of metatarsal 01/23/2013     Current Outpatient Medications on File Prior to Visit  Medication Sig Dispense Refill   Accu-Chek FastClix Lancets MISC Use as directed to check blood sugars 2 times per day dx: e11.65 102 each prn   Accu-Chek Softclix Lancets lancets Use as instructed to check blood sugars twice daily E11.69 100 each 2  acetaminophen (TYLENOL) 325 MG tablet Take 1-2 tablets (325-650 mg total) by mouth every 4 (four) hours as needed for mild pain.     albuterol (VENTOLIN HFA) 108 (90 Base) MCG/ACT inhaler Inhale 2 puffs into the lungs every 6 (six) hours as needed for  wheezing or shortness of breath. (Patient taking differently: Inhale 2 puffs into the lungs 2 (two) times daily as needed for wheezing or shortness of breath.) 18 g 2   ARIPiprazole (ABILIFY) 10 MG tablet Take 10 mg by mouth daily.      Blood Glucose Monitoring Suppl (ACCU-CHEK GUIDE ME) w/Device KIT Use to check blood sugars twice daily E11.69 1 kit 1   Blood Glucose Monitoring Suppl DEVI 1 each by Does not apply route in the morning, at noon, and at bedtime. May substitute to any manufacturer covered by patient's insurance. 1 each 0   buPROPion (WELLBUTRIN XL) 300 MG 24 hr tablet Take 1 tablet (300 mg total) by mouth daily. (Patient taking differently: Take 300 mg by mouth in the morning and at bedtime.) 90 tablet 0   diclofenac Sodium (VOLTAREN) 1 % GEL Apply 4 g topically 4 (four) times daily.     fluticasone furoate-vilanterol (BREO ELLIPTA) 200-25 MCG/ACT AEPB May finish hospital dispensed container, then resume home Advair as prescribed.     fluticasone-salmeterol (ADVAIR DISKUS) 250-50 MCG/ACT AEPB Inhale 1 puff into the lungs in the morning and at bedtime. 60 each 6   gabapentin (NEURONTIN) 100 MG capsule Take 2 capsules (200 mg total) by mouth 3 (three) times daily. 90 capsule 0   Glucagon (GVOKE HYPOPEN 2-PACK) 1 MG/0.2ML SOAJ Inject 1 each into the skin as needed. If blood sugar less than 60 0.4 mL 3   glucose blood (ACCU-CHEK GUIDE) test strip Use as directed to check blood sugars 2 times per day dx: e11.69 300 strip 2   Glucose Blood (BLOOD GLUCOSE TEST STRIPS) STRP 1 each by In Vitro route in the morning, at noon, and at bedtime. May substitute to any manufacturer covered by patient's insurance. 100 strip 0   hydrOXYzine (ATARAX) 25 MG tablet Take 1 tablet (25 mg total) by mouth every 8 (eight) hours as needed for anxiety. 30 tablet 1   insulin aspart (NOVOLOG) 100 UNIT/ML FlexPen Inject 5 Units into the skin 3 (three) times daily with meals. 15 mL 0   insulin detemir (LEVEMIR FLEXTOUCH)  100 UNIT/ML FlexPen Inject 15 Units into the skin 2 (two) times daily. 15 mL 0   Insulin Pen Needle (PEN NEEDLES) 31G X 8 MM MISC UAD 100 each 0   Insulin Syringe-Needle U-100 (INSULIN SYRINGE .3CC/30GX5/16") 30G X 5/16" 0.3 ML MISC Use to inject Levemir twice daily. 100 each 2   Iron, Ferrous Sulfate, 325 (65 Fe) MG TABS Take 325 mg by mouth daily. 30 tablet 2   Lancet Device MISC 1 each by Does not apply route in the morning, at noon, and at bedtime. May substitute to any manufacturer covered by patient's insurance. 100 each 0   LINZESS 145 MCG CAPS capsule Take 145 mcg by mouth daily before breakfast.      methimazole (TAPAZOLE) 5 MG tablet Take 1 tablet (5 mg total) by mouth daily. 90 tablet 1   methocarbamol (ROBAXIN) 500 MG tablet Take 1 tablet (500 mg total) by mouth every 6 (six) hours as needed for muscle spasms. 60 tablet 1   Multiple Vitamins-Minerals (MULTIVITAMIN WITH MINERALS) tablet Take 1 tablet by mouth daily. Alive  omeprazole (PRILOSEC) 40 MG capsule TAKE 1 CAPSULE(40 MG) BY MOUTH DAILY BEFORE BREAKFAST 90 capsule 1   polyethylene glycol (MIRALAX / GLYCOLAX) 17 g packet Take 17 g by mouth 2 (two) times daily. 14 each 0   senna-docusate (SENOKOT-S) 8.6-50 MG tablet Take 1 tablet by mouth 2 (two) times daily. 30 tablet 0   traZODone (DESYREL) 100 MG tablet Take 1.5 tablets (150 mg total) by mouth at bedtime as needed for sleep. 30 tablet 0   VITAMIN D PO Take 1 capsule by mouth daily.     No current facility-administered medications on file prior to visit.    No Known Allergies  Social History   Socioeconomic History   Marital status: Single    Spouse name: Not on file   Number of children: 3   Years of education: Not on file   Highest education level: Not on file  Occupational History   Occupation: disablily  Tobacco Use   Smoking status: Former    Packs/day: 0.75    Years: 47.00    Total pack years: 35.25    Types: Cigarettes    Quit date: 2020    Years  since quitting: 4.1    Passive exposure: Past   Smokeless tobacco: Never  Vaping Use   Vaping Use: Never used  Substance and Sexual Activity   Alcohol use: No    Alcohol/week: 0.0 standard drinks of alcohol    Comment: in rehab   Drug use: No    Comment: in rehab   Sexual activity: Not Currently    Birth control/protection: Post-menopausal  Other Topics Concern   Not on file  Social History Narrative   Not on file   Social Determinants of Health   Financial Resource Strain: Medium Risk (06/23/2020)   Overall Financial Resource Strain (CARDIA)    Difficulty of Paying Living Expenses: Somewhat hard  Food Insecurity: No Food Insecurity (03/27/2020)   Hunger Vital Sign    Worried About Running Out of Food in the Last Year: Never true    Ran Out of Food in the Last Year: Never true  Transportation Needs: No Transportation Needs (03/27/2020)   PRAPARE - Hydrologist (Medical): No    Lack of Transportation (Non-Medical): No  Physical Activity: Not on file  Stress: Not on file  Social Connections: Not on file  Intimate Partner Violence: Not on file    Family History  Problem Relation Age of Onset   Heart disease Mother    Diabetes Other        mat great aunt   Cirrhosis Other        mat great aunt   Cirrhosis Other        mat great uncles x 2   Colon cancer Neg Hx    Breast cancer Neg Hx     Past Surgical History:  Procedure Laterality Date   ANTERIOR CERVICAL DECOMP/DISCECTOMY FUSION N/A 10/26/2021   Procedure: ACDF - C4-C5 - C5-C6 - C6-C7;  Surgeon: Earnie Larsson, MD;  Location: [facility];  Service: Neurosurgery;  Laterality: N/A;   BACK SURGERY  2018   CESAREAN SECTION     x3   CHOLECYSTECTOMY N/A 08/11/2016   Procedure: LAPAROSCOPIC CHOLECYSTECTOMY WITH   INTRAOPERATIVE CHOLANGIOGRAM;  Surgeon: Stark Klein, MD;  Location: [facility];  Service: General;  Laterality: N/A;   COLONOSCOPY     [place] Osteotomy w/ Graft Left 06/18/2009   Excision of  Benign Lesion Right 01/30/2013  Rt Plantar   FOOT SURGERY     HAMMER TOE REPAIR Right 02/12/2016   RIGHT #5   Hammertoe Repair Left 06/18/2009   Lt #5   HYSTEROSCOPY N/A 06/20/2017   Procedure: DILATION AND CURETTAGE, HYSTEROSCOPY w/ Polypectomy;  Surgeon: Lavonia Drafts, MD;  Location: [facility];  Service: Gynecology;  Laterality: N/A;   LUMBAR FUSION  2018   METATARSAL OSTEOTOMY Left 06/18/2009   #5   MULTIPLE TOOTH EXTRACTIONS     Nail Matrixectomy Left 06/18/2009   LT #1   OSTEOTOMY Right 01/30/2013   Rt #5   Phalangectomy Left 06/18/2009   LT #1   Phalangectomy Right 01/30/2013   Rt #1   TUBAL LIGATION      ROS: Review of Systems Negative except as stated above  PHYSICAL EXAM: BP 131/83 (BP Location: Left Arm, Patient Position: Sitting, Cuff Size: Large)   Pulse 88   Temp 98.3 F (36.8 C)   Resp 16   Ht 4' 11.02" (1.499 m)   Wt 186 lb (84.4 kg)   SpO2 94%   BMI 37.55 kg/m   Physical Exam HENT:     Head: Normocephalic and atraumatic.  Eyes:     Extraocular Movements: Extraocular movements intact.     Conjunctiva/sclera: Conjunctivae normal.     Pupils: Pupils are equal, round, and reactive to light.  Cardiovascular:     Rate and Rhythm: Normal rate and regular rhythm.     Pulses: Normal pulses.     Heart sounds: Normal heart sounds.  Pulmonary:     Effort: Pulmonary effort is normal.     Breath sounds: Normal breath sounds.  Musculoskeletal:     Cervical back: Normal range of motion and neck supple.  Neurological:     General: No focal deficit present.     Mental Status: She is alert and oriented to person, place, and time.  Psychiatric:        Mood and Affect: Mood normal.        Behavior: Behavior normal.    ASSESSMENT AND PLAN: 1. Primary hypertension - Continue Lisinopril as prescribed.  - Counseled on blood pressure goal of less than 140/90, low-sodium, DASH diet, medication compliance, 150 minutes of moderate intensity exercise per week as  tolerated. Discussed medication compliance, adverse effects. - Follow-up with primary provider in 3 months or sooner if needed.  - lisinopril (ZESTRIL) 10 MG tablet; Take 1 tablet (10 mg total) by mouth daily.  Dispense: 30 tablet; Refill: 2  2. Hyperlipidemia, unspecified hyperlipidemia type - Continue Atorvastatin as prescribed. - Routine screening.  - Follow-up with primary provider in 3 months or sooner if needed.  - Lipid panel - atorvastatin (LIPITOR) 10 MG tablet; Take 1 tablet (10 mg total) by mouth daily.  Dispense: 30 tablet; Refill: 2  3. Type 2 diabetes mellitus with stage 3b chronic kidney disease, with long-term current use of insulin (HCC) - Continue Levemir as prescribed. No refills needed as of present. - Continue Semaglutide as prescribed.  - Discussed the importance of healthy eating habits, low-carbohydrate diet, low-sugar diet, regular aerobic exercise (at least 150 minutes a week as tolerated) and medication compliance to achieve or maintain control of diabetes. - Referral to Endocrinology for further evaluation/management.  - Semaglutide, 1 MG/DOSE, (OZEMPIC, 1 MG/DOSE,) 4 MG/3ML SOPN; Inject 1 mg into the skin once a week.  Dispense: 9 mL; Refill: 0 - Ambulatory referral to Endocrinology  4. Hyperthyroidism - Referral to Endocrinology for further evaluation/management.  - Ambulatory  referral to Endocrinology  5. Nausea - Continue Promethazine as prescribed  - Follow-up with primary provider as scheduled.  - promethazine (PHENERGAN) 25 MG tablet; Take 1 tablet (25 mg total) by mouth every 8 (eight) hours as needed for nausea or vomiting.  Dispense: 30 tablet; Refill: 2   Patient was given the opportunity to ask questions.  Patient verbalized understanding of the plan and was able to repeat key elements of the plan. Patient was given clear instructions to go to Emergency Department or return to medical center if symptoms don't improve, worsen, or new problems  develop.The patient verbalized understanding.   Orders Placed This Encounter  Procedures   Lipid panel   Ambulatory referral to Endocrinology    Requested Prescriptions   Signed Prescriptions Disp Refills   lisinopril (ZESTRIL) 10 MG tablet 30 tablet 2    Sig: Take 1 tablet (10 mg total) by mouth daily.   atorvastatin (LIPITOR) 10 MG tablet 30 tablet 2    Sig: Take 1 tablet (10 mg total) by mouth daily.   promethazine (PHENERGAN) 25 MG tablet 30 tablet 2    Sig: Take 1 tablet (25 mg total) by mouth every 8 (eight) hours as needed for nausea or vomiting.   Semaglutide, 1 MG/DOSE, (OZEMPIC, 1 MG/DOSE,) 4 MG/3ML SOPN 9 mL 0    Sig: Inject 1 mg into the skin once a week.    Return in about 6 months (around 06/11/2023) for Follow-Up or next available chronic care mgmt .  Camillia Herter, NP

## 2022-12-07 ENCOUNTER — Telehealth: Payer: Self-pay | Admitting: Family

## 2022-12-07 NOTE — Telephone Encounter (Signed)
Pt says she needs a new glucose monitoring device because her current reader says error when she inserts strips. Preferred pharmacy is   [facility] [phone] - [place], [place] - [address]  [place] [address]  Phone: [phone] Fax: [phone]

## 2022-12-08 ENCOUNTER — Ambulatory Visit: Payer: Medicare HMO

## 2022-12-08 ENCOUNTER — Other Ambulatory Visit: Payer: Self-pay

## 2022-12-08 DIAGNOSIS — E1122 Type 2 diabetes mellitus with diabetic chronic kidney disease: Secondary | ICD-10-CM

## 2022-12-08 MED ORDER — LANCET DEVICE MISC: 1.0000 | Freq: Three times a day (TID) | 0 refills | Status: DC

## 2022-12-08 MED ORDER — BLOOD GLUCOSE TEST VI STRP: 1.0000 | ORAL_STRIP | Freq: Three times a day (TID) | 0 refills | Status: AC

## 2022-12-08 MED ORDER — BLOOD GLUCOSE MONITORING SUPPL DEVI: 1.0000 | Freq: Three times a day (TID) | 0 refills | Status: DC

## 2022-12-08 NOTE — Telephone Encounter (Signed)
Blood glucose kit has been reordered for pt

## 2022-12-09 ENCOUNTER — Ambulatory Visit (INDEPENDENT_AMBULATORY_CARE_PROVIDER_SITE_OTHER): Payer: Medicare HMO | Admitting: Family

## 2022-12-09 ENCOUNTER — Encounter: Payer: Self-pay | Admitting: Family

## 2022-12-09 ENCOUNTER — Telehealth: Payer: Self-pay | Admitting: Family

## 2022-12-09 VITALS — BP 131/83 | HR 88 | Temp 98.3°F | Resp 16 | Ht 59.02 in | Wt 186.0 lb

## 2022-12-09 DIAGNOSIS — N1832 Chronic kidney disease, stage 3b: Secondary | ICD-10-CM | POA: Diagnosis not present

## 2022-12-09 DIAGNOSIS — E785 Hyperlipidemia, unspecified: Secondary | ICD-10-CM

## 2022-12-09 DIAGNOSIS — Z794 Long term (current) use of insulin: Secondary | ICD-10-CM | POA: Diagnosis not present

## 2022-12-09 DIAGNOSIS — E1122 Type 2 diabetes mellitus with diabetic chronic kidney disease: Secondary | ICD-10-CM

## 2022-12-09 DIAGNOSIS — R110 Nausea: Secondary | ICD-10-CM

## 2022-12-09 DIAGNOSIS — I10 Essential (primary) hypertension: Secondary | ICD-10-CM | POA: Diagnosis not present

## 2022-12-09 DIAGNOSIS — E0590 Thyrotoxicosis, unspecified without thyrotoxic crisis or storm: Secondary | ICD-10-CM

## 2022-12-09 MED ORDER — PROMETHAZINE HCL 25 MG PO TABS: 25.0000 mg | ORAL_TABLET | Freq: Three times a day (TID) | ORAL | 2 refills | Status: DC | PRN

## 2022-12-09 MED ORDER — ATORVASTATIN CALCIUM 10 MG PO TABS: 10.0000 mg | ORAL_TABLET | Freq: Every day | ORAL | 2 refills | Status: DC

## 2022-12-09 MED ORDER — LISINOPRIL 10 MG PO TABS: 10.0000 mg | ORAL_TABLET | Freq: Every day | ORAL | 2 refills | Status: DC

## 2022-12-09 MED ORDER — OZEMPIC (1 MG/DOSE) 4 MG/3ML ~~LOC~~ SOPN: 1.0000 mg | PEN_INJECTOR | SUBCUTANEOUS | 0 refills | Status: DC

## 2022-12-09 NOTE — Progress Notes (Signed)
.  Pt presents for chronic care management   -req refill on promethazine been feeling nauseated and out of medication

## 2022-12-10 LAB — LIPID PANEL
Chol/HDL Ratio: 2.1 ratio (ref 0.0–4.4)
Cholesterol, Total: 149 mg/dL (ref 100–199)
HDL: 70 mg/dL (ref >39)
LDL Chol Calc (NIH): 66 mg/dL (ref 0–99)
Triglycerides: 61 mg/dL (ref 0–149)
VLDL Cholesterol Cal: 13 mg/dL (ref 5–40)

## 2022-12-15 ENCOUNTER — Other Ambulatory Visit: Payer: Self-pay | Admitting: Family

## 2022-12-15 DIAGNOSIS — I10 Essential (primary) hypertension: Secondary | ICD-10-CM

## 2022-12-15 DIAGNOSIS — M4310 Spondylolisthesis, site unspecified: Secondary | ICD-10-CM | POA: Diagnosis not present

## 2023-01-02 DIAGNOSIS — Z0189 Encounter for other specified special examinations: Secondary | ICD-10-CM | POA: Diagnosis not present

## 2023-01-04 ENCOUNTER — Other Ambulatory Visit: Payer: Self-pay

## 2023-01-04 ENCOUNTER — Telehealth: Payer: Self-pay | Admitting: Family

## 2023-01-04 NOTE — Progress Notes (Signed)
Patient was referred to the pharmacist by a quality report for assistance in transitioning from Levemir to an alternative basal insulin as [employer] will no longer be [facility].  Patient is awaiting endocrinology referral and next PCP appointment is 06/13/2023.  Will message PCP requesting alternative insulin be sent to patients pharmacy.  Joseph Art, Pharm.D. PGY-2 Ambulatory Care Pharmacy Resident

## 2023-01-04 NOTE — Telephone Encounter (Signed)
Copied from [place] [id number]. Topic: Referral - Status >> Jan 04, 2023  1:52 PM Ja-Kwan M wrote: Reason for CRM: Pt stated that she received a call informing her that she has an appt in [place] but she does not have transportation to get there. Pt stated she needs the referral to be in [facility]

## 2023-01-05 ENCOUNTER — Ambulatory Visit
Admission: RE | Admit: 2023-01-05 | Discharge: 2023-01-05 | Disposition: A | Discharge status: Home / Self Care | Payer: Medicare HMO | Source: Ambulatory Visit | Attending: Family | Admitting: Family

## 2023-01-05 DIAGNOSIS — Z1231 Encounter for screening mammogram for malignant neoplasm of breast: Secondary | ICD-10-CM | POA: Diagnosis not present

## 2023-01-13 ENCOUNTER — Other Ambulatory Visit: Payer: Self-pay | Admitting: Physician Assistant

## 2023-01-14 ENCOUNTER — Other Ambulatory Visit: Payer: Self-pay | Admitting: Family

## 2023-01-16 NOTE — Telephone Encounter (Signed)
Requested Prescriptions  Pending Prescriptions Disp Refills   ONETOUCH ULTRA test strip [Pharmacy Med Name: ONE TOUCH ULTRA BLUE TESTST(NEW)100] 100 strip 0    Sig: TEST EVERY MORNING, AT NOON AND AT BEDTIME     Endocrinology: Diabetes - Testing Supplies Passed - 01/14/2023  3:09 AM      Passed - Valid encounter within last 12 months    Recent Outpatient Visits           1 month ago Primary hypertension   [place] [facility], Amy J, NP   2 months ago Bilateral arm pain   [place] [facility], [place], NP   4 months ago Hospital discharge follow-up   [facility], Amy J, NP   5 months ago Acute right-sided low back pain, unspecified whether sciatica present   [facility], Amy J, NP   8 months ago Chronic abdominal pain   [place] [facility], Salomon Fick, NP       Future Appointments             In 4 months Rema Fendt, NP [facility]

## 2023-01-19 ENCOUNTER — Other Ambulatory Visit: Payer: Self-pay

## 2023-01-25 ENCOUNTER — Ambulatory Visit: Payer: Self-pay

## 2023-01-25 ENCOUNTER — Other Ambulatory Visit: Payer: Self-pay | Admitting: Family

## 2023-01-25 DIAGNOSIS — I10 Essential (primary) hypertension: Secondary | ICD-10-CM

## 2023-01-25 NOTE — Telephone Encounter (Signed)
  Chief Complaint: medication problems  Symptoms: increased heartburn, decreased appetite, nausea, stomach pain, difficulty sleeping and staying asleep, 2-3 hours of sleep per night  Frequency: ongoing for weeks  Pertinent Negatives: NA Disposition: ED /[] Urgent Care (no appt availability in office) / Appointment(In office/virtual)/  [place] [facility]/ Home Care/ Refused Recommended Disposition /[x] [place] [facility]/  Follow-up with PCP Additional Notes: pt states she feels like the Omeprazole  and Trazodone  is no longer working for her. She has the above sx as well and has upcoming appt with GI but not until June. Offered appt for 01/31/23 but pt states she has eye dr appt that day and uses transportation. Offered [facility] tomorrow and gave location details. Pt states she will try to go there and be seen.   Summary: acid refulx?/sleep issues   Pt states that she is still having issues with her acid reflex and stomach pain and feeling nauseous. Pt states that she has been taking her omeprazole (PRILOSEC) 40 MG capsule and it is not helping. Pt would like to know what to do.   Pt also states that she is taking traZODone (DESYREL) 100 MG tablet for sleeping and it is not helping. Per pt she went to sleep at 7am and woke back up at 9am.         Reason for Disposition  Prescription request for new medicine (not a refill)  Answer Assessment - Initial Assessment Questions 1. NAME of MEDICINE: "What medicine(s) are you calling about?"     Omeprazole  and Trazodone   2. QUESTION: "What is your question?" (e.g., double dose of medicine, side effect)     Feeling like meds are not working  3. PRESCRIBER: "Who prescribed the medicine?" Reason: if prescribed by specialist, call should be referred to that group.     Amy, NP 4. SYMPTOMS: "Do you have any symptoms?" If Yes, ask: "What symptoms are you having?"  "How bad are the symptoms (e.g., mild, moderate, severe)      Increased in heartburn, staying constant and decreased appetite, nausea, stomach pain comes and goes, 7-8/10  Difficulty going to sleep and staying asleep. 2-3 hours of sleep  Protocols used: Medication Question Call-A-AH

## 2023-01-25 NOTE — Telephone Encounter (Signed)
Medication Refill - Medication: spironolactone (ALDACTONE) 50 MG tablet  Pt states she is out of medication.  Has the patient contacted their pharmacy? Yes.   Per Pharmacy they already sent over refill request.    Preferred Pharmacy (with phone number or street name): [facility] [phone] - [place],  - [address]  Phone: [phone] Fax: [phone]  Has the patient been seen for an appointment in the last year OR does the patient have an upcoming appointment? Yes.    Agent: Please be advised that RX refills may take up to 3 business days. We ask that you follow-up with your pharmacy.

## 2023-01-26 NOTE — Telephone Encounter (Signed)
Requested medication (s) are due for refill today: yes  Requested medication (s) are on the active medication list: yes  Last refill:  07/03/20  Future visit scheduled: yes  Notes to clinic:  Unable to refill per protocol, last refill by another provider. Historical provider, routing for approval. Patient out of medication.     Requested Prescriptions  Pending Prescriptions Disp Refills   spironolactone (ALDACTONE) 50 MG tablet      Sig: Take 1 tablet (50 mg total) by mouth daily.     Cardiovascular: Diuretics - Aldosterone Antagonist Passed - 01/25/2023  4:30 PM      Passed - Cr in normal range and within 180 days    Creat  Date Value Ref Range Status  09/23/2019 1.32 (H) 0.50 - 0.99 mg/dL Final    Comment:    For patients >31 years of age, the reference limit for Creatinine is approximately 13% higher for people identified as African-American. .    Creatinine, Ser  Date Value Ref Range Status  08/29/2022 0.92 0.44 - 1.00 mg/dL Final   Creatinine, POC  Date Value Ref Range Status  06/23/2020 100 mg/dL Final   Creatinine, Urine  Date Value Ref Range Status  07/17/2017 65.11 mg/dL Final    Comment:    Performed at [facility], [address] [place], [place] [address]         Passed - K in normal range and within 180 days    Potassium  Date Value Ref Range Status  08/29/2022 4.0 3.5 - 5.1 mmol/L Final         Passed - Na in normal range and within 180 days    Sodium  Date Value Ref Range Status  08/29/2022 138 135 - 145 mmol/L Final  05/13/2022 138 134 - 144 mmol/L Final         Passed - eGFR is 30 or above and within 180 days    GFR, Est African American  Date Value Ref Range Status  09/23/2019 50 (L) > OR = 60 mL/min/1.73m2 Final   GFR calc Af Amer  Date Value Ref Range Status  11/26/2020 55 (L) >59 mL/min/1.73 Final    Comment:    **In accordance with recommendations from the NKF-ASN Task force,**   [facility] is in the process of updating its  eGFR calculation to the   2021 CKD-EPI creatinine equation that estimates kidney function   without a race variable.    GFR, Est Non African American  Date Value Ref Range Status  09/23/2019 43 (L) > OR = 60 mL/min/1.87m2 Final   GFR, Estimated  Date Value Ref Range Status  08/29/2022 >60 >60 mL/min Final    Comment:    (NOTE) Calculated using the CKD-EPI Creatinine Equation (2021)    eGFR  Date Value Ref Range Status  05/13/2022 72 >59 mL/min/1.73 Final         Passed - Last BP in normal range    BP Readings from Last 1 Encounters:  12/09/22 131/83         Passed - Valid encounter within last 6 months    Recent Outpatient Visits           1 month ago Primary hypertension   [place] [facility], Amy J, NP   2 months ago Bilateral arm pain   [place] [facility], [place], NP   4 months ago Hospital discharge follow-up  [facility], [place] J, NP   5 months ago Acute right-sided low back pain, unspecified whether sciatica present   [facility], Amy J, NP   8 months ago Chronic abdominal pain   [place] [facility] at [facility], Salomon Fick, NP       Future Appointments             In 4 months Rema Fendt, NP [facility] at [facility]

## 2023-01-27 ENCOUNTER — Ambulatory Visit: Payer: Self-pay | Admitting: *Deleted

## 2023-01-27 NOTE — Telephone Encounter (Signed)
Complete

## 2023-01-27 NOTE — Telephone Encounter (Signed)
Call patient with update. Patient's blood pressure at goal during most recent office visit on 12/09/2022. Patient was continued on Lisinopril at that time. Spironolactone appears as a historical medication and was discontinued during a previous hospital discharge. Please get more details from patient and notify me of patient's reply.

## 2023-01-27 NOTE — Telephone Encounter (Signed)
Opened chart to take pt's call.   She was disconnected or hung up on prior phone call earlier today.   When try to call her back her voicemail is not set up so can't leave a message. She hung up again before the call was transferred to me.  Pr agent she is calling regarding BP medication Spiraldactone.    I attempted to call her back on [phone] but voicemail box was full so unable to leave a message.

## 2023-01-27 NOTE — Telephone Encounter (Signed)
Patient calling to request information regarding medication spiraldactone per agent Sherene Sires. Upon transfer to NT call was disconnected. NT attempted to call patient back #[phone] to review message from A. Zonia Kief, NP from 01/27/23 regarding medications. Patient did not answer, VM box full unable to leave message to call back.

## 2023-01-30 NOTE — Telephone Encounter (Signed)
Noted  

## 2023-01-30 NOTE — Telephone Encounter (Signed)
Called patient.  Patient states she is unable to find pill bottle but states she has been taking Spirolactone until 2 weeks ago.   She does not see spironolactone on her medication list,   States she only has Lisinopril at present moment.Advised to take BP daily,  record, and report reading to PCP. Advised to report reading 140/90 or greater.

## 2023-01-31 LAB — HM DIABETES EYE EXAM

## 2023-02-01 NOTE — Progress Notes (Signed)
Patient ID: Kara Kelley, female    DOB: Feb 19, 1956  MRN: [id number]  CC: Follow-Up  Subjective: Kara Kelley is a 67 y.o. female who presents for follow-up.   Her concerns today include:  01/25/2023 per triage RN call note: Chief Complaint: medication problems  Symptoms: increased heartburn, decreased appetite, nausea, stomach pain, difficulty sleeping and staying asleep, 2-3 hours of sleep per night  Frequency: ongoing for weeks  Pertinent Negatives: NA Disposition: [] ED /[] Urgent Care (no appt availability in office) / [] Appointment(In office/virtual)/ []  [place] [facility]/ [] Home Care/ [] Refused Recommended Disposition /[x]  Mobile Bus/ []  Follow-up with PCP Additional Notes: pt states she feels like the Omeprazole 40mg  and Trazodone 100mg  is no longer working for her. She has the above sx as well and has upcoming appt with GI but not until June. Offered appt for 01/31/23 but pt states she has eye dr appt that day and uses transportation. Offered [facility] tomorrow and gave location details. Pt states she will try to go there and be seen.       Summary: acid refulx?/sleep issues    Pt states that she is still having issues with her acid reflex and stomach pain and feeling nauseous. Pt states that she has been taking her omeprazole (PRILOSEC) 40 MG capsule and it is not helping. Pt would like to know what to do.   Pt also states that she is taking traZODone (DESYREL) 100 MG tablet for sleeping and it is not helping. Per pt she went to sleep at 7am and woke back up at 9am.          Reason for Disposition  Prescription request for new medicine (not a refill)  Answer Assessment - Initial Assessment Questions 1. NAME of MEDICINE: "What medicine(s) are you calling about?"     Omeprazole 40mg  and Trazodone 100mg   2. QUESTION: "What is your question?" (e.g., double dose of medicine, side effect)     Feeling like meds are not working  3. PRESCRIBER: "Who prescribed the  medicine?" Reason: if prescribed by specialist, call should be referred to that group.     Osiris Charles, NP 4. SYMPTOMS: "Do you have any symptoms?" If Yes, ask: "What symptoms are you having?"  "How bad are the symptoms (e.g., mild, moderate, severe)     Increased in heartburn, staying constant and decreased appetite, nausea, stomach pain comes and goes, 7-8/10  Difficulty going to sleep and staying asleep. 2-3 hours of sleep  Protocols used: Medication Question Call-A-AH  Today's visit 02/10/2023: - Patient reports acid reflux persisting and causing nausea. States sometimes she has stomach cramps/muscle spasms. She is aware of her upcoming appointment with Gastroenterology in June 2024.  - Doing well on Lisinopril, no issues/concerns. I discussed with patient in detail recommendation to continue to hold Spironolactone for now due to blood pressure at goal and she was agreeable/verbalized understanding. - Needs refills of insulin. Reports Levemir has been discontinued "from the market" and would like to trial a replacement. She is checking her blood sugars in the home setting. Reports sometimes she does not take insulin if her blood sugars "run low".  - Reports she is established with a Psychiatrist who manages Trazodone for her. States she plans to schedule an appointment with the same when her schedule permits. She denies thoughts of self-harm, suicidal ideations, homicidal ideations. - No further issues/concerns for discussion today.   Patient Active Problem List   Diagnosis Date Noted   Candida vaginitis 09/16/2022  Spinal stenosis, lumbar 08/24/2022   Falls, initial encounter 08/20/2022   Frequent falls 08/20/2022   Leg weakness 08/20/2022   Lumbar stenosis 08/20/2022   Microcytic hypochromic anemia 08/20/2022   Bacterial vaginosis 05/16/2022   Cholelithiasis without obstruction 04/11/2022   Chronic idiopathic constipation 04/11/2022   Gallbladder calculus with acute cholecystitis and no  obstruction 04/11/2022   History of adenomatous polyp of colon 04/11/2022   Morbid obesity (HCC) 04/11/2022   Polysubstance abuse (HCC) 04/11/2022   Right lower quadrant pain 04/11/2022   Type 2 diabetes mellitus (HCC) 04/11/2022   Type II diabetes mellitus (HCC) 04/11/2022   Pharyngoesophageal dysphagia 04/05/2022   Hyperthyroidism 12/29/2021   Cervical spondylosis with myelopathy and radiculopathy 10/26/2021   Bilateral impacted cerumen 09/16/2020   Itching of ear 09/16/2020   Heartburn 07/10/2020   Lumbar radiculopathy 04/03/2020   Hearing loss of right ear 03/12/2020   Plantar flexed metatarsal bone of left foot 08/14/2019   Skin burn 07/09/2019   Uncontrolled type 2 diabetes mellitus with hyperglycemia (HCC) 06/19/2019   Hypertensive retinopathy of both eyes 03/06/2019   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) 03/06/2019   Essential hypertension 10/05/2018   Cramp and spasm 07/20/2018   Lactic acidosis    SIRS (systemic inflammatory response syndrome) (HCC)    Acute renal failure (HCC)    Type 2 diabetes mellitus with stage 3b chronic kidney disease, without long-term current use of insulin (HCC)    Acute blood loss anemia    Sepsis (HCC) 07/17/2017   Hyponatremia 07/17/2017   Postoperative wound infection 07/17/2017   AKI (acute kidney injury) (HCC) 07/17/2017   Dehydration 07/17/2017   DM type 2 (diabetes mellitus, type 2) (HCC) 07/17/2017   Acute encephalopathy 07/17/2017   Degenerative spondylolisthesis 07/03/2017   Post-menopausal bleeding 06/20/2017   Chronic hepatitis C (HCC) 12/09/2015   Inclusion cyst 09/16/2014   Onychomycosis 08/15/2014   Type I (juvenile type) diabetes mellitus with renal manifestations, not stated as uncontrolled(250.41) 06/10/2014   Unspecified constipation 05/22/2014   Gastroesophageal reflux disease without esophagitis 05/22/2014   Pain in lower limb 01/29/2014   Eschar of multiple sites 07/31/2013   Porokeratosis  07/02/2013   Cyst of joint of ankle or foot 06/04/2013   History of endometrial ablation 02/06/2013   Pain in joint, ankle and foot 01/23/2013   Callus of foot 01/23/2013   Ingrown nail 01/23/2013   Deformity of metatarsal 01/23/2013     Current Outpatient Medications on File Prior to Visit  Medication Sig Dispense Refill   Accu-Chek FastClix Lancets MISC Use as directed to check blood sugars 2 times per day dx: e11.65 102 each prn   Accu-Chek Softclix Lancets lancets Use as instructed to check blood sugars twice daily E11.69 100 each 2   acetaminophen (TYLENOL) 325 MG tablet Take 1-2 tablets (325-650 mg total) by mouth every 4 (four) hours as needed for mild pain.     albuterol (VENTOLIN HFA) 108 (90 Base) MCG/ACT inhaler Inhale 2 puffs into the lungs every 6 (six) hours as needed for wheezing or shortness of breath. (Patient taking differently: Inhale 2 puffs into the lungs 2 (two) times daily as needed for wheezing or shortness of breath.) 18 g 2   ARIPiprazole (ABILIFY) 10 MG tablet Take 10 mg by mouth daily.      atorvastatin (LIPITOR) 10 MG tablet Take 1 tablet (10 mg total) by mouth daily. 30 tablet 2   Blood Glucose Monitoring Suppl (ACCU-CHEK GUIDE ME) w/Device KIT Use to check  blood sugars twice daily E11.69 1 kit 1   Blood Glucose Monitoring Suppl DEVI 1 each by Does not apply route in the morning, at noon, and at bedtime. May substitute to any manufacturer covered by patient's insurance. 1 each 0   buPROPion (WELLBUTRIN XL) 300 MG 24 hr tablet Take 1 tablet (300 mg total) by mouth daily. (Patient taking differently: Take 300 mg by mouth in the morning and at bedtime.) 90 tablet 0   diclofenac Sodium (VOLTAREN) 1 % GEL Apply 4 g topically 4 (four) times daily.     fluticasone furoate-vilanterol (BREO ELLIPTA) 200-25 MCG/ACT AEPB May finish hospital dispensed container, then resume home Advair as prescribed.     fluticasone-salmeterol (ADVAIR DISKUS) 250-50 MCG/ACT AEPB Inhale 1 puff  into the lungs in the morning and at bedtime. 60 each 6   gabapentin (NEURONTIN) 100 MG capsule Take 2 capsules (200 mg total) by mouth 3 (three) times daily. 90 capsule 0   Glucagon (GVOKE HYPOPEN 2-PACK) 1 MG/0.2ML SOAJ Inject 1 each into the skin as needed. If blood sugar less than 60 0.4 mL 3   glucose blood (ONETOUCH ULTRA) test strip TEST EVERY MORNING, AT NOON AND AT BEDTIME 100 strip 0   hydrOXYzine (ATARAX) 25 MG tablet Take 1 tablet (25 mg total) by mouth every 8 (eight) hours as needed for anxiety. 30 tablet 1   insulin aspart (NOVOLOG) 100 UNIT/ML FlexPen Inject 5 Units into the skin 3 (three) times daily with meals. 15 mL 0   Insulin Pen Needle (PEN NEEDLES) 31G X 8 MM MISC UAD 100 each 0   Insulin Syringe-Needle U-100 (INSULIN SYRINGE .3CC/30GX5/16") 30G X 5/16" 0.3 ML MISC Use to inject Levemir twice daily. 100 each 2   Iron, Ferrous Sulfate, 325 (65 Fe) MG TABS Take 325 mg by mouth daily. 30 tablet 2   Lancet Device MISC 1 each by Does not apply route in the morning, at noon, and at bedtime. May substitute to any manufacturer covered by patient's insurance. 100 each 0   LINZESS 145 MCG CAPS capsule Take 145 mcg by mouth daily before breakfast.      methimazole (TAPAZOLE) 5 MG tablet Take 1 tablet (5 mg total) by mouth daily. 90 tablet 1   methocarbamol (ROBAXIN) 500 MG tablet Take 1 tablet (500 mg total) by mouth every 6 (six) hours as needed for muscle spasms. 60 tablet 1   Multiple Vitamins-Minerals (MULTIVITAMIN WITH MINERALS) tablet Take 1 tablet by mouth daily. Alive     polyethylene glycol (MIRALAX / GLYCOLAX) 17 g packet Take 17 g by mouth 2 (two) times daily. 14 each 0   promethazine (PHENERGAN) 25 MG tablet Take 1 tablet (25 mg total) by mouth every 8 (eight) hours as needed for nausea or vomiting. 30 tablet 2   Semaglutide, 1 MG/DOSE, (OZEMPIC, 1 MG/DOSE,) 4 MG/3ML SOPN Inject 1 mg into the skin once a week. 9 mL 0   senna-docusate (SENOKOT-S) 8.6-50 MG tablet Take 1 tablet  by mouth 2 (two) times daily. 30 tablet 0   spironolactone (ALDACTONE) 50 MG tablet Take 50 mg by mouth daily.     traZODone (DESYREL) 100 MG tablet Take 1.5 tablets (150 mg total) by mouth at bedtime as needed for sleep. 30 tablet 0   VITAMIN D PO Take 1 capsule by mouth daily.     No current facility-administered medications on file prior to visit.    No Known Allergies  Social History   Socioeconomic History  Marital status: Single    Spouse name: Not on file   Number of children: 3   Years of education: Not on file   Highest education level: Not on file  Occupational History   Occupation: disablily  Tobacco Use   Smoking status: Former    Packs/day: 0.75    Years: 47.00    Additional pack years: 0.00    Total pack years: 35.25    Types: Cigarettes    Quit date: 2020    Years since quitting: 4.3    Passive exposure: Past   Smokeless tobacco: Never  Vaping Use   Vaping Use: Never used  Substance and Sexual Activity   Alcohol use: No    Alcohol/week: 0.0 standard drinks of alcohol    Comment: in rehab   Drug use: No    Comment: in rehab   Sexual activity: Not Currently    Birth control/protection: Post-menopausal  Other Topics Concern   Not on file  Social History Narrative   Not on file   Social Determinants of Health   Financial Resource Strain: Medium Risk (06/23/2020)   Overall Financial Resource Strain (CARDIA)    Difficulty of Paying Living Expenses: Somewhat hard  Food Insecurity: No Food Insecurity (03/27/2020)   Hunger Vital Sign    Worried About Running Out of Food in the Last Year: Never true    Ran Out of Food in the Last Year: Never true  Transportation Needs: No Transportation Needs (03/27/2020)   PRAPARE - Administrator, Civil Service (Medical): No    Lack of Transportation (Non-Medical): No  Physical Activity: Not on file  Stress: Not on file  Social Connections: Not on file  Intimate Partner Violence: Not on file    Family  History  Problem Relation Age of Onset   Heart disease Mother    Diabetes Other        mat great aunt   Cirrhosis Other        mat great aunt   Cirrhosis Other        mat great uncles x 2   Colon cancer Neg Hx    Breast cancer Neg Hx     Past Surgical History:  Procedure Laterality Date   ANTERIOR CERVICAL DECOMP/DISCECTOMY FUSION N/A 10/26/2021   Procedure: ACDF - C4-C5 - C5-C6 - C6-C7;  Surgeon: Julio Sicks, MD;  Location: [facility];  Service: Neurosurgery;  Laterality: N/A;   BACK SURGERY  2018   CESAREAN SECTION     x3   CHOLECYSTECTOMY N/A 08/11/2016   Procedure: LAPAROSCOPIC CHOLECYSTECTOMY WITH   INTRAOPERATIVE CHOLANGIOGRAM;  Surgeon: Almond Lint, MD;  Location: [facility];  Service: General;  Laterality: N/A;   COLONOSCOPY     C[place]ton Osteotomy w/ Graft Left 06/18/2009   Excision of Benign Lesion Right 01/30/2013   Rt Plantar   FOOT SURGERY     HAMMER TOE REPAIR Right 02/12/2016   RIGHT #5   Hammertoe Repair Left 06/18/2009   Lt #5   HYSTEROSCOPY N/A 06/20/2017   Procedure: DILATION AND CURETTAGE, HYSTEROSCOPY w/ Polypectomy;  Surgeon: Willodean Rosenthal, MD;  Location: [facility];  Service: Gynecology;  Laterality: N/A;   LUMBAR FUSION  2018   METATARSAL OSTEOTOMY Left 06/18/2009   #5   MULTIPLE TOOTH EXTRACTIONS     Nail Matrixectomy Left 06/18/2009   LT #1   OSTEOTOMY Right 01/30/2013   Rt #5   Phalangectomy Left 06/18/2009   LT #1   Phalangectomy Right 01/30/2013  Rt #1   TUBAL LIGATION      ROS: Review of Systems Negative except as stated above  PHYSICAL EXAM: BP 126/74   Pulse 75   Resp 16   Wt 191 lb 3.2 oz (86.7 kg)   SpO2 95%   BMI 38.60 kg/m   Physical Exam HENT:     Head: Normocephalic and atraumatic.  Eyes:     Extraocular Movements: Extraocular movements intact.     Conjunctiva/sclera: Conjunctivae normal.     Pupils: Pupils are equal, round, and reactive to light.  Cardiovascular:     Rate and Rhythm: Normal rate and regular rhythm.     Pulses:  Normal pulses.     Heart sounds: Normal heart sounds.  Pulmonary:     Effort: Pulmonary effort is normal.     Breath sounds: Normal breath sounds.  Abdominal:     General: Bowel sounds are normal.     Palpations: Abdomen is soft.  Musculoskeletal:     Cervical back: Normal range of motion and neck supple.  Neurological:     General: No focal deficit present.     Mental Status: She is alert and oriented to person, place, and time.  Psychiatric:        Mood and Affect: Mood normal.        Behavior: Behavior normal.      ASSESSMENT AND PLAN: 1. Gastroesophageal reflux disease, unspecified whether esophagitis present - Continue Omeprazole as prescribed. - Begin Sucralfate as prescribed. Counseled on medication adherence/adverse effects.  - Ondansetron as prescribed. Counseled on medication adherence/adverse effects.  - Keep all scheduled appointments with Gastroenterology. During the interim follow-up with primary provider as scheduled.  - sucralfate (CARAFATE) 1 GM/10ML suspension; Take 10 mLs (1 g total) by mouth 2 (two) times daily.  Dispense: 420 mL; Refill: 1 - ondansetron (ZOFRAN-ODT) 4 MG disintegrating tablet; Take 1 tablet (4 mg total) by mouth every 8 (eight) hours as needed for nausea or vomiting.  Dispense: 30 tablet; Refill: 1 - omeprazole (PRILOSEC) 40 MG capsule; TAKE 1 CAPSULE(40 MG) BY MOUTH DAILY BEFORE BREAKFAST  Dispense: 90 capsule; Refill: 0  2. Insomnia, unspecified type - Keep all scheduled appointments with Psychiatry.   3. Primary hypertension - Continue Lisinopril as prescribed.  - I discussed with patient in detail recommendation to continue to hold Spironolactone for now due to blood pressure at goal. Patient was agreeable/verbalized understanding. - Counseled on blood pressure goal of less than 140/90, low-sodium, DASH diet, medication compliance, 150 minutes of moderate intensity exercise per week as tolerated. Discussed medication compliance, adverse  effects. - Follow-up with primary provider in 6 months or sooner if needed.  - lisinopril (ZESTRIL) 10 MG tablet; Take 1 tablet (10 mg total) by mouth daily.  Dispense: 90 tablet; Refill: 0  4. Type 2 diabetes mellitus with stage 3b chronic kidney disease, without long-term current use of insulin (HCC) - Insulin Degludec prescribed as alternative for Insulin Detemir.  - Keep all scheduled appointments with Endocrinology. During the interim follow-up with primary provider as scheduled.  - insulin degludec (TRESIBA) 100 UNIT/ML FlexTouch Pen; Inject 15 Units into the skin 2 (two) times daily.  Dispense: 27 mL; Refill: 0    Patient was given the opportunity to ask questions.  Patient verbalized understanding of the plan and was able to repeat key elements of the plan. Patient was given clear instructions to go to Emergency Department or return to medical center if symptoms don't improve, worsen, or new problems  develop.The patient verbalized understanding.   Requested Prescriptions   Signed Prescriptions Disp Refills   sucralfate (CARAFATE) 1 GM/10ML suspension 420 mL 1    Sig: Take 10 mLs (1 g total) by mouth 2 (two) times daily.   ondansetron (ZOFRAN-ODT) 4 MG disintegrating tablet 30 tablet 1    Sig: Take 1 tablet (4 mg total) by mouth every 8 (eight) hours as needed for nausea or vomiting.   insulin degludec (TRESIBA) 100 UNIT/ML FlexTouch Pen 27 mL 0    Sig: Inject 15 Units into the skin 2 (two) times daily.   lisinopril (ZESTRIL) 10 MG tablet 90 tablet 0    Sig: Take 1 tablet (10 mg total) by mouth daily.   omeprazole (PRILOSEC) 40 MG capsule 90 capsule 0    Sig: TAKE 1 CAPSULE(40 MG) BY MOUTH DAILY BEFORE BREAKFAST    Follow-up with primary provider as scheduled.   Rema Fendt, NP

## 2023-02-02 ENCOUNTER — Ambulatory Visit: Payer: 59 | Admitting: Family

## 2023-02-06 ENCOUNTER — Other Ambulatory Visit: Payer: Self-pay | Admitting: Family

## 2023-02-06 NOTE — Telephone Encounter (Signed)
Medication Refill - Medication: insulin aspart (NOVOLOG) 100 UNIT/ML FlexPen   Has the patient contacted their pharmacy? yes(Agent: If no, request that the patient contact the pharmacy for the refill. If patient does not wish to contact the pharmacy document the reason why and proceed with request.) (Agent: If yes, when and what did the pharmacy advise?)contact pcp  Preferred Pharmacy (with phone number or street name):  [facility] [phone] - [place], [place] - [address] AT [address] Phone: [phone]  Fax: [phone]     Has the patient been seen for an appointment in the last year OR does the patient have an upcoming appointment? yes Agent: Please be advised that RX refills may take up to 3 business days. We ask that you follow-up with your pharmacy.

## 2023-02-07 NOTE — Telephone Encounter (Signed)
Requested medication (s) are due for refill today: yes  Requested medication (s) are on the active medication list: yes  Last refill:  08/31/22  Future visit scheduled: yes  Notes to clinic:  Unable to refill per protocol, last refill by another provider. ED provider, routing for approval.     Requested Prescriptions  Pending Prescriptions Disp Refills   insulin aspart (NOVOLOG) 100 UNIT/ML FlexPen 15 mL 0    Sig: Inject 5 Units into the skin 3 (three) times daily with meals.     Endocrinology:  Diabetes - Insulins Passed - 02/06/2023  2:28 PM      Passed - HBA1C is between 0 and 7.9 and within 180 days    Hgb A1c MFr Bld  Date Value Ref Range Status  08/20/2022 7.4 (H) 4.8 - 5.6 % Final    Comment:    (NOTE) Pre diabetes:          5.7%-6.4%  Diabetes:              >6.4%  Glycemic control for   <7.0% adults with diabetes          Passed - Valid encounter within last 6 months    Recent Outpatient Visits           2 months ago Primary hypertension   [place] [facility], Amy J, NP   3 months ago Bilateral arm pain   [place] [facility], [place], NP   4 months ago Hospital discharge follow-up   [facility], Amy J, NP   5 months ago Acute right-sided low back pain, unspecified whether sciatica present   [facility], Amy J, NP   9 months ago Chronic abdominal pain   [place] [facility], Salomon Fick, NP       Future Appointments             In 3 days Rema Fendt, NP [facility]   In 4 months Rema Fendt, NP [facility] at [facility]

## 2023-02-10 ENCOUNTER — Ambulatory Visit (INDEPENDENT_AMBULATORY_CARE_PROVIDER_SITE_OTHER): Payer: 59 | Admitting: Family

## 2023-02-10 ENCOUNTER — Encounter: Payer: Self-pay | Admitting: Family

## 2023-02-10 VITALS — BP 126/74 | HR 75 | Resp 16 | Wt 191.2 lb

## 2023-02-10 DIAGNOSIS — K219 Gastro-esophageal reflux disease without esophagitis: Secondary | ICD-10-CM

## 2023-02-10 DIAGNOSIS — I10 Essential (primary) hypertension: Secondary | ICD-10-CM | POA: Diagnosis not present

## 2023-02-10 DIAGNOSIS — G4700 Insomnia, unspecified: Secondary | ICD-10-CM | POA: Diagnosis not present

## 2023-02-10 DIAGNOSIS — E1122 Type 2 diabetes mellitus with diabetic chronic kidney disease: Secondary | ICD-10-CM

## 2023-02-10 DIAGNOSIS — N1832 Chronic kidney disease, stage 3b: Secondary | ICD-10-CM

## 2023-02-10 MED ORDER — ONDANSETRON 4 MG PO TBDP: 4.0000 mg | ORAL_TABLET | Freq: Three times a day (TID) | ORAL | 1 refills | Status: DC | PRN

## 2023-02-10 MED ORDER — INSULIN DEGLUDEC 100 UNIT/ML ~~LOC~~ SOPN: 15.0000 [IU] | PEN_INJECTOR | Freq: Two times a day (BID) | SUBCUTANEOUS | 0 refills | Status: DC

## 2023-02-10 MED ORDER — OMEPRAZOLE 40 MG PO CPDR: DELAYED_RELEASE_CAPSULE | ORAL | 0 refills | Status: DC

## 2023-02-10 MED ORDER — SUCRALFATE 1 GM/10ML PO SUSP: 1.0000 g | Freq: Two times a day (BID) | ORAL | 1 refills | Status: DC

## 2023-02-10 MED ORDER — LISINOPRIL 10 MG PO TABS: 10.0000 mg | ORAL_TABLET | Freq: Every day | ORAL | 0 refills | Status: DC

## 2023-02-10 NOTE — Progress Notes (Signed)
Pt states her symptoms has been going on for weeks Pt is requesting here novolog to be refilled

## 2023-02-15 ENCOUNTER — Other Ambulatory Visit: Payer: Self-pay | Admitting: Family

## 2023-02-15 NOTE — Telephone Encounter (Signed)
Requested medication (s) are due for refill today: yes  Requested medication (s) are on the active medication list: yes  Last refill:  08/31/22  Future visit scheduled:yes  Notes to clinic:  Unable to refill per protocol, last refill by another provider.      Requested Prescriptions  Pending Prescriptions Disp Refills   insulin aspart (NOVOLOG) 100 UNIT/ML FlexPen 15 mL 0    Sig: Inject 5 Units into the skin 3 (three) times daily with meals.     Endocrinology:  Diabetes - Insulins Passed - 02/15/2023  3:48 PM      Passed - HBA1C is between 0 and 7.9 and within 180 days    Hgb A1c MFr Bld  Date Value Ref Range Status  08/20/2022 7.4 (H) 4.8 - 5.6 % Final    Comment:    (NOTE) Pre diabetes:          5.7%-6.4%  Diabetes:              >6.4%  Glycemic control for   <7.0% adults with diabetes          Passed - Valid encounter within last 6 months    Recent Outpatient Visits           5 days ago Gastroesophageal reflux disease, unspecified whether esophagitis present   [place] [facility], [place], NP   2 months ago Primary hypertension   [place] [facility], Amy J, NP   3 months ago Bilateral arm pain   [place] [facility], [place], NP   5 months ago Hospital discharge follow-up   [facility] at [facility], Amy J, NP   6 months ago Acute right-sided low back pain, unspecified whether sciatica present   [facility], Salomon Fick, NP       Future Appointments             In 3 months Rema Fendt, NP [facility] at [facility]

## 2023-02-15 NOTE — Telephone Encounter (Signed)
Copied from CRM [id number]. Topic: General - Other >> Feb 15, 2023  3:22 PM Everette C wrote: Reason for CRM: Medication Refill - Medication: insulin aspart (NOVOLOG) 100 UNIT/ML FlexPen [[id number]]  Has the patient contacted their pharmacy? Yes.   (Agent: If no, request that the patient contact the pharmacy for the refill. If patient does not wish to contact the pharmacy document the reason why and proceed with request.) (Agent: If yes, when and what did the pharmacy advise?)  Preferred Pharmacy (with phone number or street name): [facility] [phone] - [place], [place] - [address] AT [facility] [address] [place] [address] Phone: [phone] Fax: [phone] Hours: Not open 24 hours   Has the patient been seen for an appointment in the last year OR does the patient have an upcoming appointment? Yes.    Agent: Please be advised that RX refills may take up to 3 business days. We ask that you follow-up with your pharmacy.

## 2023-02-17 ENCOUNTER — Ambulatory Visit (INDEPENDENT_AMBULATORY_CARE_PROVIDER_SITE_OTHER): Payer: 59

## 2023-02-17 ENCOUNTER — Ambulatory Visit (INDEPENDENT_AMBULATORY_CARE_PROVIDER_SITE_OTHER): Payer: 59 | Admitting: Podiatry

## 2023-02-17 DIAGNOSIS — B351 Tinea unguium: Secondary | ICD-10-CM | POA: Diagnosis not present

## 2023-02-17 DIAGNOSIS — M79674 Pain in right toe(s): Secondary | ICD-10-CM | POA: Diagnosis not present

## 2023-02-17 DIAGNOSIS — S99922A Unspecified injury of left foot, initial encounter: Secondary | ICD-10-CM | POA: Diagnosis not present

## 2023-02-17 DIAGNOSIS — M79672 Pain in left foot: Secondary | ICD-10-CM | POA: Diagnosis not present

## 2023-02-17 DIAGNOSIS — M79675 Pain in left toe(s): Secondary | ICD-10-CM | POA: Diagnosis not present

## 2023-02-18 ENCOUNTER — Other Ambulatory Visit: Payer: Self-pay | Admitting: Family

## 2023-02-20 NOTE — Telephone Encounter (Signed)
Requested Prescriptions  Pending Prescriptions Disp Refills   ONETOUCH ULTRA test strip [Pharmacy Med Name: ONE TOUCH ULTRA BLUE TESTST(NEW)100] 100 strip 0    Sig: TEST EVERY MORNING AT NOON AND AT BEDTIME     Endocrinology: Diabetes - Testing Supplies Passed - 02/18/2023  3:10 AM      Passed - Valid encounter within last 12 months    Recent Outpatient Visits           1 week ago Gastroesophageal reflux disease, unspecified whether esophagitis present   [facility], Amy J, NP   2 months ago Primary hypertension   [facility], Amy J, NP   3 months ago Bilateral arm pain   [facility], [place], NP   5 months ago Hospital discharge follow-up   [facility], Amy J, NP   6 months ago Acute right-sided low back pain, unspecified whether sciatica present   [facility], Salomon Fick, NP       Future Appointments             In 3 months Rema Fendt, NP [facility]

## 2023-02-21 LAB — HM COLONOSCOPY

## 2023-02-23 ENCOUNTER — Telehealth: Payer: Self-pay | Admitting: Family

## 2023-02-23 NOTE — Telephone Encounter (Signed)
Contacted Kara Kelley to schedule their annual wellness visit. Welcome to Medicare visit Due by 11/11/23.  Kara Kelley AWV direct phone # [phone]   WTM before 11/11/23 per [place]

## 2023-02-25 NOTE — Progress Notes (Signed)
Subjective: Chief Complaint  Patient presents with   Toe Injury    Pt needs Xray. Fractured L pinky toe    Den 67 year old female presents for above concerns.  She thinks that she broke her left fifth toe but she had it.  No recent treatment.  No other injuries.  Also states that she is the nails tendons are thickened elongated and they are causing discomfort.  No swelling redness or any drainage of the toenail sites.  Objective: AAO x3, NAD DP/PT pulses palpable bilaterally, CRT less than 3 seconds There is trace edema to the left fifth toe Palpation on the toe itself there is no pain on the metatarsals or other areas of the foot except for the nails.  There is no erythema.  There is no open lesions. Nails are hypertrophic, dystrophic, brittle, discolored, elongated 10. No surrounding redness or drainage. Tenderness nails 1-5 bilaterally. No open lesions or pre-ulcerative lesions are identified today. No pain with calf compression, swelling, warmth, erythema  Assessment: 67 year old female contusion, possible fracture left fifth toe; symptomatic onychomycosis  Plan: -All treatment options discussed with the patient including all alternatives, risks, complications.  -X-rays were obtained reviewed.  3 views of the foot were obtained of the left side.  Status post bunionectomy with hardware intact.  Status post fifth metatarsal head resection.  Difficult to fully evaluate the fifth toe for concern for possible injury, small fracture -Given discomfort recommend immobilization surgical shoe on the left side.  If her symptoms improve she can start to transition to regular shoe as tolerated -Debrided nails x 10 without any complications or bleeding -Patient encouraged to call the office with any questions, concerns, change in symptoms.   Vivi Barrack DPM

## 2023-03-01 ENCOUNTER — Other Ambulatory Visit (HOSPITAL_COMMUNITY)
Admission: RE | Admit: 2023-03-01 | Discharge: 2023-03-01 | Disposition: A | Discharge status: Home / Self Care | Payer: 59 | Source: Ambulatory Visit | Attending: Internal Medicine | Admitting: Internal Medicine

## 2023-03-01 ENCOUNTER — Other Ambulatory Visit: Payer: Self-pay

## 2023-03-01 ENCOUNTER — Encounter: Payer: Self-pay | Admitting: Student

## 2023-03-01 ENCOUNTER — Ambulatory Visit (INDEPENDENT_AMBULATORY_CARE_PROVIDER_SITE_OTHER): Payer: 59 | Admitting: Student

## 2023-03-01 VITALS — BP 125/69 | HR 77 | Temp 98.2°F | Resp 28 | Ht 59.0 in | Wt 187.6 lb

## 2023-03-01 DIAGNOSIS — B9689 Other specified bacterial agents as the cause of diseases classified elsewhere: Secondary | ICD-10-CM

## 2023-03-01 DIAGNOSIS — Z794 Long term (current) use of insulin: Secondary | ICD-10-CM

## 2023-03-01 DIAGNOSIS — I10 Essential (primary) hypertension: Secondary | ICD-10-CM

## 2023-03-01 DIAGNOSIS — Z1211 Encounter for screening for malignant neoplasm of colon: Secondary | ICD-10-CM | POA: Insufficient documentation

## 2023-03-01 DIAGNOSIS — Z124 Encounter for screening for malignant neoplasm of cervix: Secondary | ICD-10-CM | POA: Diagnosis present

## 2023-03-01 DIAGNOSIS — E0590 Thyrotoxicosis, unspecified without thyrotoxic crisis or storm: Secondary | ICD-10-CM | POA: Diagnosis not present

## 2023-03-01 DIAGNOSIS — Z1151 Encounter for screening for human papillomavirus (HPV): Secondary | ICD-10-CM | POA: Insufficient documentation

## 2023-03-01 DIAGNOSIS — E11319 Type 2 diabetes mellitus with unspecified diabetic retinopathy without macular edema: Secondary | ICD-10-CM | POA: Diagnosis not present

## 2023-03-01 DIAGNOSIS — E1122 Type 2 diabetes mellitus with diabetic chronic kidney disease: Secondary | ICD-10-CM | POA: Diagnosis not present

## 2023-03-01 DIAGNOSIS — N1832 Chronic kidney disease, stage 3b: Secondary | ICD-10-CM | POA: Diagnosis not present

## 2023-03-01 DIAGNOSIS — I129 Hypertensive chronic kidney disease with stage 1 through stage 4 chronic kidney disease, or unspecified chronic kidney disease: Secondary | ICD-10-CM

## 2023-03-01 DIAGNOSIS — Z13820 Encounter for screening for osteoporosis: Secondary | ICD-10-CM | POA: Insufficient documentation

## 2023-03-01 DIAGNOSIS — Z01419 Encounter for gynecological examination (general) (routine) without abnormal findings: Secondary | ICD-10-CM | POA: Diagnosis not present

## 2023-03-01 DIAGNOSIS — Z6837 Body mass index (BMI) 37.0-37.9, adult: Secondary | ICD-10-CM

## 2023-03-01 DIAGNOSIS — N760 Acute vaginitis: Secondary | ICD-10-CM

## 2023-03-01 DIAGNOSIS — D62 Acute posthemorrhagic anemia: Secondary | ICD-10-CM

## 2023-03-01 LAB — POCT GLYCOSYLATED HEMOGLOBIN (HGB A1C): Hemoglobin A1C: 6.6 % — AB (ref 4.0–5.6)

## 2023-03-01 LAB — GLUCOSE, CAPILLARY: Glucose-Capillary: 153 mg/dL — ABNORMAL HIGH (ref 70–99)

## 2023-03-01 MED ORDER — MOUNJARO 2.5 MG/0.5ML ~~LOC~~ SOAJ
2.5000 mg | SUBCUTANEOUS | 2 refills | Status: DC
Start: 2023-03-01 — End: 2023-10-26

## 2023-03-01 MED ORDER — INSULIN LISPRO (1 UNIT DIAL) 100 UNIT/ML (KWIKPEN)
5.0000 [IU] | PEN_INJECTOR | Freq: Three times a day (TID) | SUBCUTANEOUS | 11 refills | Status: DC
Start: 2023-03-01 — End: 2023-10-02

## 2023-03-01 MED ORDER — INSULIN LISPRO (1 UNIT DIAL) 100 UNIT/ML (KWIKPEN): 5.0000 [IU] | PEN_INJECTOR | Freq: Three times a day (TID) | SUBCUTANEOUS | 11 refills | Status: DC

## 2023-03-01 NOTE — Assessment & Plan Note (Addendum)
Unclear about when patient had bleeding. On her most recent CBC, patient did have microcytic anemia. She denies any bleeding at this time. Will repeat CBC at this time.   Plan: -CBC pending -If still anemic will need to obtain more information with iron studies   Addendum: -CBC within normal limits.  Hemoglobin normal.

## 2023-03-01 NOTE — Progress Notes (Signed)
CC: New patient establishment  HPI:  [name] is a 67 y.o. female with past medical history of hypertension, type 2 diabetes, hyperparathyroidism, GERD who presents for establishing care.  Please see assessment and plan for full HPI.   Diabetes: Ozempic 1 mg weekly, Tresiba 15 units twice daily, HumoLog 5 units 3 times daily with meals Insomnia: 150 mg trazodone nightly as needed Hypothyroidism: Methimazole 5 mg daily Hypertension: Lisinopril 10 mg daily Depression:wellbuttrin 300 mg twice daily Anxiety: Atarax 25 mg every 8 hours, abilify Peptic ulcer disease: Omeprazole 40 mg daily, Carafate 1 g twice daily Iron deficiency: Ferrous sulfate 325 mg daily Diabetic neuropathy gabapentin 200 mg 3 times daily Asthma: Breo Ellipta, Advair Hyperlipidemia: 10 mg Lipitor  Most recent labs: Lipid panel 12/28/2022: Total cholesterol 149, HDL 70, LDL 66 BMP 08/29/2022 sodium 138, potassium 4.0, creatinine 0.92 CBC 11/19/723: White count 8.9, hemoglobin 8.3, MCV 72.8, platelets 219  Past Medical History:  Diagnosis Date   Anemia    Anxiety    Arthritis    Asthma    Bacterial vaginosis 05/16/2022   Bunion    Callus    Candida vaginitis 09/16/2022   Cholelithiasis without obstruction 04/11/2022   Chronic pain    Cocaine abuse (HCC)    COPD (chronic obstructive pulmonary disease) (HCC)    Corns and callosities    Degenerative joint disease    Depression    Diabetes mellitus    Endometrial polyp    ETOH abuse    Gall stones    Gallbladder calculus with acute cholecystitis and no obstruction 04/11/2022   GERD (gastroesophageal reflux disease)    Headache    history of Migraines   Hepatitis C    Hep C   History of adenomatous polyp of colon 04/11/2022   History of endometrial ablation 02/06/2013   Hyperlipidemia    Hypertension    Insomnia    Spondylolisthesis of lumbar region    Substance abuse (HCC)    alcoholism   Tuberculosis 1985   Wears dentures     Wears glasses      Current Outpatient Medications:    tirzepatide (MOUNJARO) 2.5 MG/0.5ML Pen, Inject 2.5 mg into the skin once a week., Disp: 2 mL, Rfl: 2   Accu-Chek FastClix Lancets MISC, Use as directed to check blood sugars 2 times per day dx: e11.65, Disp: 102 each, Rfl: prn   Accu-Chek Softclix Lancets lancets, Use as instructed to check blood sugars twice daily E11.69, Disp: 100 each, Rfl: 2   acetaminophen (TYLENOL) 325 MG tablet, Take 1-2 tablets (325-650 mg total) by mouth every 4 (four) hours as needed for mild pain., Disp: , Rfl:    albuterol (VENTOLIN HFA) 108 (90 Base) MCG/ACT inhaler, Inhale 2 puffs into the lungs every 6 (six) hours as needed for wheezing or shortness of breath. (Patient taking differently: Inhale 2 puffs into the lungs 2 (two) times daily as needed for wheezing or shortness of breath.), Disp: 18 g, Rfl: 2   ARIPiprazole (ABILIFY) 10 MG tablet, Take 10 mg by mouth daily. , Disp: , Rfl:    atorvastatin (LIPITOR) 10 MG tablet, Take 1 tablet (10 mg total) by mouth daily., Disp: 30 tablet, Rfl: 2   Blood Glucose Monitoring Suppl (ACCU-CHEK GUIDE ME) w/Device KIT, Use to check blood sugars twice daily E11.69, Disp: 1 kit, Rfl: 1   Blood Glucose Monitoring Suppl DEVI, 1 each by Does not apply route in the morning, at noon, and at bedtime. May substitute  to any manufacturer covered by patient's insurance., Disp: 1 each, Rfl: 0   buPROPion (WELLBUTRIN XL) 300 MG 24 hr tablet, Take 1 tablet (300 mg total) by mouth daily. (Patient taking differently: Take 300 mg by mouth in the morning and at bedtime.), Disp: 90 tablet, Rfl: 0   diclofenac Sodium (VOLTAREN) 1 % GEL, Apply 4 g topically 4 (four) times daily., Disp: , Rfl:    fluticasone furoate-vilanterol (BREO ELLIPTA) 200-25 MCG/ACT AEPB, May finish hospital dispensed container, then resume home Advair as prescribed., Disp: , Rfl:    fluticasone-salmeterol (ADVAIR DISKUS) 250-50 MCG/ACT AEPB, Inhale 1 puff into the lungs in  the morning and at bedtime., Disp: 60 each, Rfl: 6   hydrOXYzine (ATARAX) 25 MG tablet, Take 1 tablet (25 mg total) by mouth every 8 (eight) hours as needed for anxiety., Disp: 30 tablet, Rfl: 1   insulin degludec (TRESIBA) 100 UNIT/ML FlexTouch Pen, Inject 15 Units into the skin 2 (two) times daily., Disp: 27 mL, Rfl: 0   insulin lispro (HUMALOG) 100 UNIT/ML KwikPen, Inject 5 Units into the skin 3 (three) times daily with meals., Disp: 4.5 mL, Rfl: 11   Insulin Pen Needle (PEN NEEDLES) 31G X 8 MM MISC, UAD, Disp: 100 each, Rfl: 0   Insulin Syringe-Needle U-100 (INSULIN SYRINGE .3CC/30GX5/16") 30G X 5/16" 0.3 ML MISC, Use to inject Levemir twice daily., Disp: 100 each, Rfl: 2   Iron, Ferrous Sulfate, 325 (65 Fe) MG TABS, Take 325 mg by mouth daily., Disp: 30 tablet, Rfl: 2   Lancet Device MISC, 1 each by Does not apply route in the morning, at noon, and at bedtime. May substitute to any manufacturer covered by patient's insurance., Disp: 100 each, Rfl: 0   LINZESS 145 MCG CAPS capsule, Take 145 mcg by mouth daily before breakfast. , Disp: , Rfl:    lisinopril (ZESTRIL) 10 MG tablet, Take 1 tablet (10 mg total) by mouth daily., Disp: 90 tablet, Rfl: 0   methimazole (TAPAZOLE) 5 MG tablet, Take 1 tablet (5 mg total) by mouth daily., Disp: 90 tablet, Rfl: 1   Multiple Vitamins-Minerals (MULTIVITAMIN WITH MINERALS) tablet, Take 1 tablet by mouth daily. Alive, Disp: , Rfl:    omeprazole (PRILOSEC) 40 MG capsule, TAKE 1 CAPSULE(40 MG) BY MOUTH DAILY BEFORE BREAKFAST, Disp: 90 capsule, Rfl: 0   ondansetron (ZOFRAN-ODT) 4 MG disintegrating tablet, Take 1 tablet (4 mg total) by mouth every 8 (eight) hours as needed for nausea or vomiting., Disp: 30 tablet, Rfl: 1   ONETOUCH ULTRA test strip, TEST EVERY MORNING AT NOON AND AT BEDTIME, Disp: 100 strip, Rfl: 0   promethazine (PHENERGAN) 25 MG tablet, Take 1 tablet (25 mg total) by mouth every 8 (eight) hours as needed for nausea or vomiting., Disp: 30 tablet,  Rfl: 2   senna-docusate (SENOKOT-S) 8.6-50 MG tablet, Take 1 tablet by mouth 2 (two) times daily., Disp: 30 tablet, Rfl: 0   spironolactone (ALDACTONE) 50 MG tablet, Take 50 mg by mouth daily., Disp: , Rfl:    sucralfate (CARAFATE) 1 GM/10ML suspension, Take 10 mLs (1 g total) by mouth 2 (two) times daily., Disp: 420 mL, Rfl: 1   traZODone (DESYREL) 100 MG tablet, Take 1.5 tablets (150 mg total) by mouth at bedtime as needed for sleep., Disp: 30 tablet, Rfl: 0   VITAMIN D PO, Take 1 capsule by mouth daily., Disp: , Rfl:   Review of Systems:    Negative except what is stated in HPI.   Physical Exam:  Vitals:   03/01/23 1533  BP: 125/69  Pulse: 77  Resp: (!) 28  Temp: 98.2 F (36.8 C)  TempSrc: Oral  SpO2: 97%  Weight: 187 lb 9.6 oz (85.1 kg)  Height: 4\' 11"  (1.499 m)   General: Patient is sitting comfortably in the room  Head: Normocephalic, atraumatic  Cardio: Regular rate and rhythm, no murmurs, rubs or gallops. 2+ pulses to bilateral upper and lower extremities  Pulmonary: Clear to ausculation bilaterally with no rales, rhonchi, and crackles  GU: On exam noticed some purulent drainage around the cervical os  Assessment & Plan:   Essential hypertension Patient has a past medical history of hypertension.  Patient's current regimen includes lisinopril 10 mg daily, spironolactone 50 mg daily.  Patient states that she has been compliant with her medication.  She denies any chest pain, shortness of breath, or vision changes.  Blood pressure 125/69 today.  This is at goal.  Plan: -Continue lisinopril 10 mg daily -Continue spironolactone 50 mg daily -BMP pending     Controlled type 2 diabetes mellitus with diabetic retinopathy (HCC) Patient has a past medical history of diabetes mellitus.  Patient current regimen includes Tresiba 15 units twice daily.  She also is on Humalog 5 units 3 times daily with meals.  Patient also takes Ozempic 1 mg weekly.  She states she has been  having some intolerance to Ozempic, and would like to switch off.  A1c today 6.6 down from 7.4 6 months ago.   Plan: -Continue current insulin regimen with triseba 15 units BID and humalog 5 units with meals -Discontinue Ozempic next-start Mounjaro 2.5 mg weekly -Refer patient to diabetic coordinator -Follow-up in 1 month -Urine microalbumin/creatinine ratio pending -Eye exam records requested  Hyperthyroidism Patient has a past medical history of hyperparathyroidism.  Unclear etiology.  Patient has been on methimazole in the past.  Patient has not had TSH in a while.  Will update today.  Patient reports she has not been on her methimazole.  Plan: -TSH pending -If decreased, can restart methimazole, can also workup for etiology with thyroid receptor antibodies  Morbid obesity (HCC) Patient encouraged to lose some weight given this is adding to her comorbidities.  Counseled on weight loss, exercise, and diet.  Screening for cervical cancer Patient updated on Pap smear today.  Will follow-up results.  During exam, did notice some purulent drainage near cervical os.  Patient does have history of vaginal candidiasis as well as bacterial vaginitis.  Patient has been treated in the past.  Will obtain vaginal swab today.  Plan: -Follow-up vaginal swab -Follow-up Pap smear results  Osteoporosis screening Patient referred for DEXA scan   Colon cancer screening Patient endorses that she recently has been screened for colon cancer earlier on 02/21/2023. Did not see records. Will follow up results.  Acute blood loss anemia Unclear about when patient had bleeding. On her most recent CBC, patient did have microcytic anemia. She denies any bleeding at this time. Will repeat CBC at this time.   Plan: -CBC pending -If still anemic will need to obtain more information with iron studies   Patient discussed with Dr. Princella Pellegrini, DO PGY-1 Internal Medicine Resident  Pager: [phone]

## 2023-03-01 NOTE — Assessment & Plan Note (Addendum)
Patient has a past medical history of hypertension.  Patient's current regimen includes lisinopril 10 mg daily, spironolactone 50 mg daily.  Patient states that she has been compliant with her medication.  She denies any chest pain, shortness of breath, or vision changes.  Blood pressure 125/69 today.  This is at goal.  Plan: -Continue lisinopril 10 mg daily -Continue spironolactone 50 mg daily -BMP pending   Addendum: -BMP showing elevated creatinine.  Likely dehydration.  Encourage good hydration.

## 2023-03-01 NOTE — Assessment & Plan Note (Signed)
Patient updated on Pap smear today.  Will follow-up results.  During exam, did notice some purulent drainage near cervical os.  Patient does have history of vaginal candidiasis as well as bacterial vaginitis.  Patient has been treated in the past.  Will obtain vaginal swab today.  Plan: -Follow-up vaginal swab -Follow-up Pap smear results  Addendum: -Vaginal swab positive for bacterial vaginitis, will treat with metronidazole 500 mg twice daily for the next of days.  Will follow-up Pap smear.

## 2023-03-01 NOTE — Assessment & Plan Note (Signed)
Patient referred for DEXA scan

## 2023-03-01 NOTE — Assessment & Plan Note (Addendum)
Patient has a past medical history of hyperparathyroidism.  Unclear etiology.  Patient has been on methimazole in the past.  Patient has not had TSH in a while.  Will update today.  Patient reports she has not been on her methimazole.  Plan: -TSH pending -If decreased, can restart methimazole, can also workup for etiology with thyroid receptor antibodies  Addendum: -TSH decreased, free T4 within normal limits.  Will measure T3.  Patient does have follow-up with endocrinology.

## 2023-03-01 NOTE — Assessment & Plan Note (Signed)
Patient encouraged to lose some weight given this is adding to her comorbidities.  Counseled on weight loss, exercise, and diet.

## 2023-03-01 NOTE — Assessment & Plan Note (Addendum)
Patient has a past medical history of diabetes mellitus.  Patient current regimen includes Tresiba 15 units twice daily.  She also is on Humalog 5 units 3 times daily with meals.  Patient also takes Ozempic 1 mg weekly.  She states she has been having some intolerance to Ozempic, and would like to switch off.  A1c today 6.6 down from 7.4 6 months ago.   Plan: -Continue current insulin regimen with triseba 15 units BID and humalog 5 units with meals -Discontinue Ozempic next-start Mounjaro 2.5 mg weekly -Refer patient to diabetic coordinator -Follow-up in 1 month -Urine microalbumin/creatinine ratio pending -Eye exam records requested  Addendum: -Normal microalbumin/creatinine ratio.  Continue current management.

## 2023-03-01 NOTE — Patient Instructions (Addendum)
Kara Kelley you for allowing me to take part in your care today.  Here are your instructions.  1. For your diabetes, your A1c today is 6.6. This is at goal of less than 7. Please take your medications as prescribe. Humalog 5 units with meals (breakfast, lunch, dinner). Please take Triseba 15 units every morning and every night. Stop taking the ozempic and start this new medication called Mounjaro. Please take 2.5 mg weekly.   2. I have taken some labs today. Please wait for my phone call for the results.  3. Please call if you have any other concerns.   Thank you, Dr. Allena Katz  If you have any other questions please contact the internal medicine clinic at [phone]

## 2023-03-01 NOTE — Assessment & Plan Note (Signed)
Patient endorses that she recently has been screened for colon cancer earlier on 02/21/2023. Did not see records. Will follow up results.

## 2023-03-02 LAB — CBC
Hematocrit: 36.2 % (ref 34.0–46.6)
Hemoglobin: 12 g/dL (ref 11.1–15.9)
MCH: 25.2 pg — ABNORMAL LOW (ref 26.6–33.0)
MCHC: 33.1 g/dL (ref 31.5–35.7)
MCV: 76 fL — ABNORMAL LOW (ref 79–97)
Platelets: 174 10*3/uL (ref 150–450)
RBC: 4.77 x10E6/uL (ref 3.77–5.28)
RDW: 15 % (ref 11.7–15.4)
WBC: 5.5 10*3/uL (ref 3.4–10.8)

## 2023-03-02 LAB — BMP8+ANION GAP
Anion Gap: 17 mmol/L (ref 10.0–18.0)
BUN/Creatinine Ratio: 11 — ABNORMAL LOW (ref 12–28)
BUN: 13 mg/dL (ref 8–27)
CO2: 19 mmol/L — ABNORMAL LOW (ref 20–29)
Calcium: 9.3 mg/dL (ref 8.7–10.3)
Chloride: 103 mmol/L (ref 96–106)
Creatinine, Ser: 1.19 mg/dL — ABNORMAL HIGH (ref 0.57–1.00)
Glucose: 123 mg/dL — ABNORMAL HIGH (ref 70–99)
Potassium: 4.3 mmol/L (ref 3.5–5.2)
Sodium: 139 mmol/L (ref 134–144)
eGFR: 51 mL/min/{1.73_m2} — ABNORMAL LOW (ref >59)

## 2023-03-02 LAB — TSH RFX ON ABNORMAL TO FREE T4: TSH: 0.015 u[IU]/mL — ABNORMAL LOW (ref 0.450–4.500)

## 2023-03-02 LAB — CERVICOVAGINAL ANCILLARY ONLY
Bacterial Vaginitis (gardnerella): POSITIVE — AB
Candida Glabrata: NEGATIVE
Candida Vaginitis: NEGATIVE
Chlamydia: NEGATIVE
Comment: NEGATIVE
Comment: NEGATIVE
Comment: NEGATIVE
Comment: NEGATIVE
Comment: NEGATIVE
Comment: NORMAL
Neisseria Gonorrhea: NEGATIVE
Trichomonas: NEGATIVE

## 2023-03-02 LAB — MICROALBUMIN / CREATININE URINE RATIO
Creatinine, Urine: 63.1 mg/dL
Microalb/Creat Ratio: 15 mg/g creat (ref 0–29)
Microalbumin, Urine: 9.7 ug/mL

## 2023-03-02 LAB — T4F: T4,Free (Direct): 1.44 ng/dL (ref 0.82–1.77)

## 2023-03-02 MED ORDER — METRONIDAZOLE 500 MG PO TABS
500.0000 mg | ORAL_TABLET | Freq: Two times a day (BID) | ORAL | 0 refills | Status: AC
Start: 2023-03-02 — End: 2023-03-09

## 2023-03-02 NOTE — Addendum Note (Signed)
Addended by: Modena Slater on: 03/02/2023 05:22 PM   Modules accepted: Orders

## 2023-03-02 NOTE — Progress Notes (Signed)
ROI form faxed to [facility] [employer] for request of records.

## 2023-03-03 NOTE — Progress Notes (Signed)
Internal Medicine Clinic Attending  Case discussed with Dr. Patel  At the time of the visit.  We reviewed the resident's history and exam and pertinent patient test results.  I agree with the assessment, diagnosis, and plan of care documented in the resident's note.  

## 2023-03-06 LAB — CYTOLOGY - PAP
Adequacy: ABSENT
Comment: NEGATIVE
Diagnosis: NEGATIVE
High risk HPV: NEGATIVE

## 2023-03-07 ENCOUNTER — Telehealth: Payer: Self-pay | Admitting: Family Medicine

## 2023-03-07 ENCOUNTER — Ambulatory Visit: Payer: 59 | Admitting: Internal Medicine

## 2023-03-07 NOTE — Telephone Encounter (Signed)
Called pt to schedule annual medicare wellness visit pt stated she has new PCP she sees now.

## 2023-03-07 NOTE — Progress Notes (Deleted)
Name: Kara Kelley  MRN/ DOB: [id number], 29-Aug-1956    Age/ Sex: 67 y.o., female    PCP: Modena Slater, DO   Reason for Endocrinology Evaluation: Hyperthyroidism      Date of Initial Endocrinology Evaluation: 01/05/2023    HPI: Ms. Kara Kelley is a 67 y.o. female with a past medical history of T2DM, COPD, and HTN. The patient presented for initial endocrinology clinic visit on 01/05/2023 for consultative assistance with her Hyperthyroidism.   Pt has been diagnosed with hyperthyroidism with a suppressed TSH at < 0.005 uIU/mL on 12/28/2021, no concomitant FT4  , during routine physical    On her initial visit with me, she had a suppressed TSH <0.005 u IU/mL, she was started on methimazole 02/2023  No FH of thyroid disease    SUBJECTIVE:    Today (03/07/23):  Kara Kelley is here for follow-up on hyperthyroid      Weight has ben fluctuating  Has occasional palpitations  Denies loose stools or diarrhea but has constipation  Has noted worsening tremors ~ 3 weeks ago  Has anxiety   No prior diagnosis of Osteoporosis  Has DJD  No prior dx of cardiac arrhythmia  Denies local neck swelling      HISTORY:  Past Medical History:  Past Medical History:  Diagnosis Date   Anemia    Anxiety    Arthritis    Asthma    Bacterial vaginosis 05/16/2022   Bunion    Callus    Candida vaginitis 09/16/2022   Cholelithiasis without obstruction 04/11/2022   Chronic pain    Cocaine abuse (HCC)    COPD (chronic obstructive pulmonary disease) (HCC)    Corns and callosities    Degenerative joint disease    Depression    Diabetes mellitus    Endometrial polyp    ETOH abuse    Gall stones    Gallbladder calculus with acute cholecystitis and no obstruction 04/11/2022   GERD (gastroesophageal reflux disease)    Headache    history of Migraines   Hepatitis C    Hep C   History of adenomatous polyp of colon 04/11/2022   History of endometrial ablation 02/06/2013    Hyperlipidemia    Hypertension    Insomnia    Spondylolisthesis of lumbar region    Substance abuse (HCC)    alcoholism   Tuberculosis 1985   Wears dentures    Wears glasses    Past Surgical History:  Past Surgical History:  Procedure Laterality Date   ANTERIOR CERVICAL DECOMP/DISCECTOMY FUSION N/A 10/26/2021   Procedure: ACDF - C4-C5 - C5-C6 - C6-C7;  Surgeon: Julio Sicks, MD;  Location: [facility];  Service: Neurosurgery;  Laterality: N/A;   BACK SURGERY  2018   CESAREAN SECTION     x3   CHOLECYSTECTOMY N/A 08/11/2016   Procedure: LAPAROSCOPIC CHOLECYSTECTOMY WITH   INTRAOPERATIVE CHOLANGIOGRAM;  Surgeon: Almond Lint, MD;  Location: [facility];  Service: General;  Laterality: N/A;   COLONOSCOPY     Cotton Osteotomy w/ Graft Left 06/18/2009   Excision of Benign Lesion Right 01/30/2013   Rt Plantar   FOOT SURGERY     HAMMER TOE REPAIR Right 02/12/2016   RIGHT #5   Hammertoe Repair Left 06/18/2009   Lt #5   HYSTEROSCOPY N/A 06/20/2017   Procedure: DILATION AND CURETTAGE, HYSTEROSCOPY w/ Polypectomy;  Surgeon: Willodean Rosenthal, MD;  Location: [facility];  Service: Gynecology;  Laterality: N/A;   LUMBAR FUSION  2018  METATARSAL OSTEOTOMY Left 06/18/2009   #5   MULTIPLE TOOTH EXTRACTIONS     Nail Matrixectomy Left 06/18/2009   LT #1   OSTEOTOMY Right 01/30/2013   Rt #5   Phalangectomy Left 06/18/2009   LT #1   Phalangectomy Right 01/30/2013   Rt #1   TUBAL LIGATION      Social History:  reports that she quit smoking about 4 years ago. Her smoking use included cigarettes. She has a 35.25 pack-year smoking history. She has been exposed to tobacco smoke. She has never used smokeless tobacco. She reports that she does not drink alcohol and does not use drugs. Family History: family history includes Cirrhosis in some other family members; Diabetes in an other family member; Heart disease in her mother.   HOME MEDICATIONS: Allergies as of 03/07/2023   No Known Allergies      Medication List         Accurate as of Mar 07, 2023  7:33 AM. If you have any questions, ask your nurse or doctor.          Accu-Chek Guide Me w/Device Kit Use to check blood sugars twice daily E11.69   Blood Glucose Monitoring Suppl Devi 1 each by Does not apply route in the morning, at noon, and at bedtime. May substitute to any manufacturer covered by patient's insurance.   Accu-Chek Softclix Lancets lancets Use as instructed to check blood sugars twice daily E11.69   Accu-Chek FastClix Lancets Misc Use as directed to check blood sugars 2 times per day dx: e11.65   acetaminophen 325 MG tablet Commonly known as: TYLENOL Take 1-2 tablets (325-650 mg total) by mouth every 4 (four) hours as needed for mild pain.   albuterol 108 (90 Base) MCG/ACT inhaler Commonly known as: VENTOLIN HFA Inhale 2 puffs into the lungs every 6 (six) hours as needed for wheezing or shortness of breath. What changed: when to take this   ARIPiprazole 10 MG tablet Commonly known as: ABILIFY Take 10 mg by mouth daily.   atorvastatin 10 MG tablet Commonly known as: LIPITOR Take 1 tablet (10 mg total) by mouth daily.   buPROPion 300 MG 24 hr tablet Commonly known as: WELLBUTRIN XL Take 1 tablet (300 mg total) by mouth daily. What changed: when to take this   diclofenac Sodium 1 % Gel Commonly known as: VOLTAREN Apply 4 g topically 4 (four) times daily.   fluticasone furoate-vilanterol 200-25 MCG/ACT Aepb Commonly known as: BREO ELLIPTA May finish hospital dispensed container, then resume home Advair as prescribed.   fluticasone-salmeterol 250-50 MCG/ACT Aepb Commonly known as: Advair Diskus Inhale 1 puff into the lungs in the morning and at bedtime.   hydrOXYzine 25 MG tablet Commonly known as: ATARAX Take 1 tablet (25 mg total) by mouth every 8 (eight) hours as needed for anxiety.   insulin degludec 100 UNIT/ML FlexTouch Pen Commonly known as: TRESIBA Inject 15 Units into the skin 2 (two) times  daily.   insulin lispro 100 UNIT/ML KwikPen Commonly known as: HUMALOG Inject 5 Units into the skin 3 (three) times daily with meals.   INSULIN SYRINGE .3CC/30GX5/16" 30G X 5/16" 0.3 ML Misc Use to inject Levemir twice daily.   Iron (Ferrous Sulfate) 325 (65 Fe) MG Tabs Take 325 mg by mouth daily.   Lancet Device Misc 1 each by Does not apply route in the morning, at noon, and at bedtime. May substitute to any manufacturer covered by patient's insurance.   Linzess 145 MCG Caps capsule  Generic drug: linaclotide Take 145 mcg by mouth daily before breakfast.   lisinopril 10 MG tablet Commonly known as: ZESTRIL Take 1 tablet (10 mg total) by mouth daily.   methimazole 5 MG tablet Commonly known as: TAPAZOLE Take 1 tablet (5 mg total) by mouth daily.   metroNIDAZOLE 500 MG tablet Commonly known as: Flagyl Take 1 tablet (500 mg total) by mouth 2 (two) times daily for 7 days.   Mounjaro 2.5 MG/0.5ML Pen Generic drug: tirzepatide Inject 2.5 mg into the skin once a week.   multivitamin with minerals tablet Take 1 tablet by mouth daily. Alive   omeprazole 40 MG capsule Commonly known as: PRILOSEC TAKE 1 CAPSULE(40 MG) BY MOUTH DAILY BEFORE BREAKFAST   ondansetron 4 MG disintegrating tablet Commonly known as: ZOFRAN-ODT Take 1 tablet (4 mg total) by mouth every 8 (eight) hours as needed for nausea or vomiting.   OneTouch Ultra test strip Generic drug: glucose blood TEST EVERY MORNING AT NOON AND AT BEDTIME   Pen Needles 31G X 8 MM Misc UAD   promethazine 25 MG tablet Commonly known as: PHENERGAN Take 1 tablet (25 mg total) by mouth every 8 (eight) hours as needed for nausea or vomiting.   senna-docusate 8.6-50 MG tablet Commonly known as: Senokot-S Take 1 tablet by mouth 2 (two) times daily.   spironolactone 50 MG tablet Commonly known as: ALDACTONE Take 50 mg by mouth daily.   sucralfate 1 GM/10ML suspension Commonly known as: Carafate Take 10 mLs (1 g total)  by mouth 2 (two) times daily.   traZODone 100 MG tablet Commonly known as: DESYREL Take 1.5 tablets (150 mg total) by mouth at bedtime as needed for sleep.   VITAMIN D PO Take 1 capsule by mouth daily.          REVIEW OF SYSTEMS: A comprehensive ROS was conducted with the patient and is negative except as per HPI     OBJECTIVE:  VS: There were no vitals taken for this visit.   Wt Readings from Last 3 Encounters:  03/01/23 187 lb 9.6 oz (85.1 kg)  02/10/23 191 lb 3.2 oz (86.7 kg)  12/09/22 186 lb (84.4 kg)     EXAM: General: Pt appears well and is in NAD  Eyes: External eye exam normal without stare, lid lag or exophthalmos.  EOM intact.  P.  Neck: General: Supple without adenopathy. Thyroid: Thyroid size normal.  No goiter or nodules appreciated. Right Anterior incision noted with tenderness   Lungs: Clear with good BS bilat with no rales, rhonchi, or wheezes  Heart: Auscultation: RRR.  Abdomen: Normoactive bowel sounds, soft, nontender, without masses or organomegaly palpable  Extremities:  BL LE: No pretibial edema normal ROM and strength.  Mental Status: Judgment, insight: Intact Orientation: Oriented to time, place, and person Mood and affect: No depression, anxiety, or agitation     DATA REVIEWED:   Latest Reference Range & Units 03/01/23 16:25  TSH 0.450 - 4.500 uIU/mL 0.015 (L)  T4,Free (Direct) 0.82 - 1.77 ng/dL 1.61  (L): Data is abnormally low    Latest Reference Range & Units 03/01/23 16:25  WBC 3.4 - 10.8 x10E3/uL 5.5  RBC 3.77 - 5.28 x10E6/uL 4.77  Hemoglobin 11.1 - 15.9 g/dL 09.6  HCT 04.5 - 40.9 % 36.2  MCV 79 - 97 fL 76 (L)  MCH 26.6 - 33.0 pg 25.2 (L)  MCHC 31.5 - 35.7 g/dL 81.1  RDW 91.4 - 78.2 % 15.0  Platelets 150 - 450 x10E3/uL  174  (L): Data is abnormally low    Latest Reference Range & Units 03/01/23 16:25  Sodium 134 - 144 mmol/L 139  Potassium 3.5 - 5.2 mmol/L 4.3  Chloride 96 - 106 mmol/L 103  CO2 20 - 29 mmol/L 19 (L)   Glucose 70 - 99 mg/dL 478 (H)  BUN 8 - 27 mg/dL 13  Creatinine 2.95 - 6.21 mg/dL 3.08 (H)  Calcium 8.7 - 10.3 mg/dL 9.3  Anion gap 65.7 - 84.6 mmol/L 17.0  BUN/Creatinine Ratio 12 - 28  11 (L)  eGFR >59 mL/min/1.73 51 (L)    Latest Reference Range & Units 12/28/21 09:02  TSH 0.450 - 4.500 uIU/mL <0.005 (L)     ASSESSMENT/PLAN/RECOMMENDATIONS:   Hyperthyroidism :   - Pt with anxiety and palpitations as well as tremors  - NO local neck symptoms   - She had cervical spine sx  and the area is still tender , so will hold off on thyroid ultrasound at this time  - The causes of subclinical hyperthyroidism are the same as the causes of overt hyperthyroidism, and like overt hyperthyroidism, subclinical hyperthyroidism can be persistent or transient. Common causes of subclinical hyperthyroidism include autonomously functioning thyroid adenomas and multinodular goiters , or Graves' disease    - Most patients with subclinical hyperthyroidism have no clinical manifestations of hyperthyroidism, and those symptoms that are present (eg, tachycardia, tremor, dyspnea on exertion, weight loss) are mild and nonspecific." However, subclinical hyperthyroidism is associated with an increased risk of atrial fibrillation and, primarily in postmenopausal women, a decrease in bone mineral density.  - We discussed that Graves' Disease is a result of an autoimmune condition involving the thyroid.    We discussed with pt the benefits of methimazole in the Tx of hyperthyroidism, as well as the possible side effects/complications of anti-thyroid drug Tx (specifically detailing the rare, but serious side effect of agranulocytosis). She was informed of need for regular thyroid function monitoring while on methimazole to ensure appropriate dosage without over-treatment. As well, we discussed the possible side effects of methimazole including the chance of rash, the small chance of liver irritation/juandice and the <=1  in 300-400 chance of sudden onset agranulocytosis.  We discussed importance of going to ED promptly (and stopping methimazole) if shewere to develop significant fever with severe sore throat of other evidence of acute infection.     We extensively discussed the various treatment options for hyperthyroidism and Graves disease including ablation therapy with radioactive iodine versus antithyroid drug treatment versus surgical therapy.  We recommended to the patient that we felt, at this time, that thionamide  therapy would be most optimal.  We discussed the various possible benefits versus side effects of the various therapies.   I carefully explained to the patient that one of the consequences of I-131 ablation treatment would likely be permanent hypothyroidism which would require long-term replacement therapy with LT4.  - Will check TFT's and TRAb in 6 weeks     Medications : Start Methimazole 5 mg daily     F/U 3 months  Labs in 6 weeks   Signed electronically by: Lyndle Herrlich, MD  [facility] Endocrinology  [facility] [address] [place] [place], [place] [address] Phone: [phone] FAX: [phone]   CC: Modena Slater, DO [address] [place] [address] Phone: [phone] Fax: [phone]   Return to Endocrinology clinic as below: Future Appointments  Date Time Provider Department Center  03/07/2023 10:30 AM Malloree Raboin, Konrad Dolores,  MD [id number] None  04/25/2023 11:15 AM Ardelle Anton Lesia Sago, DPM [facility]

## 2023-03-08 ENCOUNTER — Other Ambulatory Visit: Payer: Self-pay

## 2023-03-08 DIAGNOSIS — R110 Nausea: Secondary | ICD-10-CM

## 2023-03-08 MED ORDER — PROMETHAZINE HCL 25 MG PO TABS
25.0000 mg | ORAL_TABLET | Freq: Three times a day (TID) | ORAL | 2 refills | Status: DC | PRN
Start: 2023-03-08 — End: 2023-11-01

## 2023-03-09 ENCOUNTER — Other Ambulatory Visit: Payer: 59

## 2023-03-09 DIAGNOSIS — E0590 Thyrotoxicosis, unspecified without thyrotoxic crisis or storm: Secondary | ICD-10-CM

## 2023-03-10 LAB — T3: T3, Total: 127 ng/dL (ref 71–180)

## 2023-03-12 ENCOUNTER — Other Ambulatory Visit: Payer: Self-pay | Admitting: Family

## 2023-03-12 DIAGNOSIS — E785 Hyperlipidemia, unspecified: Secondary | ICD-10-CM

## 2023-03-19 ENCOUNTER — Other Ambulatory Visit: Payer: Self-pay | Admitting: Family

## 2023-03-19 DIAGNOSIS — Z794 Long term (current) use of insulin: Secondary | ICD-10-CM

## 2023-03-20 ENCOUNTER — Telehealth: Payer: Self-pay

## 2023-03-20 NOTE — Telephone Encounter (Signed)
Return pt's call. Stated she has been taking Cyclobenzaprine for muscle spasms and cramps in the feet/legs/abd; no working. Requesting a different medication - Tizanidine. Thanks

## 2023-03-20 NOTE — Telephone Encounter (Signed)
Requesting to speak with a nurse about muscle relaxer and cramp medication. Please call pt back.

## 2023-03-21 ENCOUNTER — Other Ambulatory Visit: Payer: Self-pay | Admitting: Family

## 2023-03-21 DIAGNOSIS — K219 Gastro-esophageal reflux disease without esophagitis: Secondary | ICD-10-CM

## 2023-03-22 NOTE — Telephone Encounter (Signed)
Pt was called / informed to schedule an appt to discuss muscle spasms per Dr Nooruddin - pt agreed. Appt schedule 6/24 @ 1015AM w/Dr Cliffton Asters.

## 2023-03-27 ENCOUNTER — Other Ambulatory Visit: Payer: Self-pay | Admitting: Acute Care

## 2023-03-27 DIAGNOSIS — Z87891 Personal history of nicotine dependence: Secondary | ICD-10-CM

## 2023-03-27 DIAGNOSIS — Z122 Encounter for screening for malignant neoplasm of respiratory organs: Secondary | ICD-10-CM

## 2023-03-28 ENCOUNTER — Telehealth: Payer: Self-pay

## 2023-03-28 NOTE — Telephone Encounter (Signed)
Thanks. I have spoken to her about her labs.

## 2023-03-28 NOTE — Telephone Encounter (Signed)
Requesting test results, please call pt back.  

## 2023-03-30 ENCOUNTER — Encounter: Payer: 59 | Admitting: Student

## 2023-04-07 ENCOUNTER — Other Ambulatory Visit: Payer: Self-pay | Admitting: Family

## 2023-04-07 DIAGNOSIS — Z794 Long term (current) use of insulin: Secondary | ICD-10-CM

## 2023-04-09 ENCOUNTER — Other Ambulatory Visit: Payer: Self-pay | Admitting: Student

## 2023-04-09 DIAGNOSIS — Z794 Long term (current) use of insulin: Secondary | ICD-10-CM

## 2023-04-19 ENCOUNTER — Other Ambulatory Visit: Payer: Self-pay

## 2023-04-19 MED ORDER — ONETOUCH ULTRA VI STRP: ORAL_STRIP | 0 refills | Status: DC

## 2023-04-20 ENCOUNTER — Ambulatory Visit (INDEPENDENT_AMBULATORY_CARE_PROVIDER_SITE_OTHER): Payer: 59 | Admitting: Student

## 2023-04-20 ENCOUNTER — Other Ambulatory Visit: Payer: Self-pay | Admitting: *Deleted

## 2023-04-20 ENCOUNTER — Ambulatory Visit (INDEPENDENT_AMBULATORY_CARE_PROVIDER_SITE_OTHER): Payer: 59

## 2023-04-20 ENCOUNTER — Encounter: Payer: Self-pay | Admitting: Student

## 2023-04-20 VITALS — BP 154/81 | HR 68 | Temp 97.8°F | Ht 59.0 in | Wt 199.1 lb

## 2023-04-20 DIAGNOSIS — Z794 Long term (current) use of insulin: Secondary | ICD-10-CM | POA: Diagnosis not present

## 2023-04-20 DIAGNOSIS — R109 Unspecified abdominal pain: Secondary | ICD-10-CM | POA: Diagnosis not present

## 2023-04-20 DIAGNOSIS — Z7985 Long-term (current) use of injectable non-insulin antidiabetic drugs: Secondary | ICD-10-CM

## 2023-04-20 DIAGNOSIS — Z0000 Encounter for general adult medical examination without abnormal findings: Secondary | ICD-10-CM

## 2023-04-20 DIAGNOSIS — E11319 Type 2 diabetes mellitus with unspecified diabetic retinopathy without macular edema: Secondary | ICD-10-CM

## 2023-04-20 DIAGNOSIS — D509 Iron deficiency anemia, unspecified: Secondary | ICD-10-CM

## 2023-04-20 DIAGNOSIS — I10 Essential (primary) hypertension: Secondary | ICD-10-CM | POA: Diagnosis not present

## 2023-04-20 MED ORDER — IRON (FERROUS SULFATE) 325 (65 FE) MG PO TABS
325.0000 mg | ORAL_TABLET | Freq: Every day | ORAL | 2 refills | Status: DC
Start: 2023-04-20 — End: 2023-06-08

## 2023-04-20 MED ORDER — LISINOPRIL 20 MG PO TABS: 20.0000 mg | ORAL_TABLET | Freq: Every day | ORAL | 11 refills | Status: DC

## 2023-04-20 MED ORDER — TIZANIDINE HCL 2 MG PO TABS: 2.0000 mg | ORAL_TABLET | Freq: Three times a day (TID) | ORAL | 0 refills | Status: DC | PRN

## 2023-04-20 NOTE — Progress Notes (Signed)
CC: Abdominal spasms, HTN  HPI:  [name] is a 67 y.o. female living with a history stated below and presents today for abdominal spasms and HTN. Please see problem based assessment and plan for additional details.  Past Medical History:  Diagnosis Date   Anemia    Anxiety    Arthritis    Asthma    Bacterial vaginosis 05/16/2022   Bunion    Callus    Candida vaginitis 09/16/2022   Cholelithiasis without obstruction 04/11/2022   Chronic pain    Cocaine abuse (HCC)    COPD (chronic obstructive pulmonary disease) (HCC)    Corns and callosities    Degenerative joint disease    Depression    Diabetes mellitus    Endometrial polyp    ETOH abuse    Gall stones    Gallbladder calculus with acute cholecystitis and no obstruction 04/11/2022   GERD (gastroesophageal reflux disease)    Headache    history of Migraines   Hepatitis C    Hep C   History of adenomatous polyp of colon 04/11/2022   History of endometrial ablation 02/06/2013   Hyperlipidemia    Hypertension    Insomnia    Spondylolisthesis of lumbar region    Substance abuse (HCC)    alcoholism   Tuberculosis 1985   Wears dentures    Wears glasses     Current Outpatient Medications on File Prior to Visit  Medication Sig Dispense Refill   Accu-Chek FastClix Lancets MISC Use as directed to check blood sugars 2 times per day dx: e11.65 102 each prn   Accu-Chek Softclix Lancets lancets Use as instructed to check blood sugars twice daily E11.69 100 each 2   acetaminophen (TYLENOL) 325 MG tablet Take 1-2 tablets (325-650 mg total) by mouth every 4 (four) hours as needed for mild pain.     albuterol (VENTOLIN HFA) 108 (90 Base) MCG/ACT inhaler Inhale 2 puffs into the lungs every 6 (six) hours as needed for wheezing or shortness of breath. (Patient taking differently: Inhale 2 puffs into the lungs 2 (two) times daily as needed for wheezing or shortness of breath.) 18 g 2   ARIPiprazole (ABILIFY) 10 MG tablet  Take 10 mg by mouth daily.      atorvastatin (LIPITOR) 10 MG tablet Take 1 tablet (10 mg total) by mouth daily. 30 tablet 2   Blood Glucose Monitoring Suppl (ACCU-CHEK GUIDE ME) w/Device KIT Use to check blood sugars twice daily E11.69 1 kit 1   Blood Glucose Monitoring Suppl DEVI 1 each by Does not apply route in the morning, at noon, and at bedtime. May substitute to any manufacturer covered by patient's insurance. 1 each 0   buPROPion (WELLBUTRIN XL) 300 MG 24 hr tablet Take 1 tablet (300 mg total) by mouth daily. (Patient taking differently: Take 300 mg by mouth in the morning and at bedtime.) 90 tablet 0   cyclobenzaprine (FLEXERIL) 10 MG tablet TAKE 1 TABLET BY MOUTH THREE TIMES DAILY 30 tablet 0   diclofenac Sodium (VOLTAREN) 1 % GEL Apply 4 g topically 4 (four) times daily.     fluticasone furoate-vilanterol (BREO ELLIPTA) 200-25 MCG/ACT AEPB May finish hospital dispensed container, then resume home Advair as prescribed.     fluticasone-salmeterol (ADVAIR DISKUS) 250-50 MCG/ACT AEPB Inhale 1 puff into the lungs in the morning and at bedtime. 60 each 6   glucose blood (ONETOUCH ULTRA) test strip TEST EVERY MORNING AT NOON AND AT BEDTIME 100 strip 0  hydrOXYzine (ATARAX) 25 MG tablet Take 1 tablet (25 mg total) by mouth every 8 (eight) hours as needed for anxiety. 30 tablet 1   insulin degludec (TRESIBA) 100 UNIT/ML FlexTouch Pen Inject 15 Units into the skin 2 (two) times daily. 27 mL 0   insulin lispro (HUMALOG) 100 UNIT/ML KwikPen Inject 5 Units into the skin 3 (three) times daily with meals. 4.5 mL 11   Insulin Pen Needle (PEN NEEDLES) 31G X 8 MM MISC UAD 100 each 0   Insulin Syringe-Needle U-100 (INSULIN SYRINGE .3CC/30GX5/16") 30G X 5/16" 0.3 ML MISC Use to inject Levemir twice daily. 100 each 2   Iron, Ferrous Sulfate, 325 (65 Fe) MG TABS Take 325 mg by mouth daily. 30 tablet 2   Lancet Device MISC 1 each by Does not apply route in the morning, at noon, and at bedtime. May substitute to  any manufacturer covered by patient's insurance. 100 each 0   LINZESS 145 MCG CAPS capsule Take 145 mcg by mouth daily before breakfast.      lisinopril (ZESTRIL) 10 MG tablet Take 1 tablet (10 mg total) by mouth daily. 90 tablet 0   metFORMIN (GLUCOPHAGE-XR) 500 MG 24 hr tablet TAKE 1 TABLET(500 MG) BY MOUTH DAILY WITH BREAKFAST 90 tablet 1   methimazole (TAPAZOLE) 5 MG tablet Take 1 tablet (5 mg total) by mouth daily. 90 tablet 1   Multiple Vitamins-Minerals (MULTIVITAMIN WITH MINERALS) tablet Take 1 tablet by mouth daily. Alive     omeprazole (PRILOSEC) 40 MG capsule TAKE 1 CAPSULE(40 MG) BY MOUTH DAILY BEFORE BREAKFAST 90 capsule 0   ondansetron (ZOFRAN-ODT) 4 MG disintegrating tablet Take 1 tablet (4 mg total) by mouth every 8 (eight) hours as needed for nausea or vomiting. 30 tablet 1   promethazine (PHENERGAN) 25 MG tablet Take 1 tablet (25 mg total) by mouth every 8 (eight) hours as needed for nausea or vomiting. 30 tablet 2   senna-docusate (SENOKOT-S) 8.6-50 MG tablet Take 1 tablet by mouth 2 (two) times daily. 30 tablet 0   spironolactone (ALDACTONE) 50 MG tablet Take 50 mg by mouth daily.     sucralfate (CARAFATE) 1 GM/10ML suspension SHAKE LIQUID AND TAKE 10 ML(1 GRAM) BY MOUTH TWICE DAILY 420 mL 1   tirzepatide (MOUNJARO) 2.5 MG/0.5ML Pen Inject 2.5 mg into the skin once a week. 2 mL 2   traZODone (DESYREL) 100 MG tablet Take 1.5 tablets (150 mg total) by mouth at bedtime as needed for sleep. 30 tablet 0   VITAMIN D PO Take 1 capsule by mouth daily.     No current facility-administered medications on file prior to visit.   Review of Systems: ROS negative except for what is noted on the assessment and plan.  Vitals:   04/20/23 0909 04/20/23 1003  BP: (!) 166/90 (!) 154/81  Pulse: 68   Temp: 97.8 F (36.6 C)   TempSrc: Oral   SpO2: 96%   Weight: 199 lb 1.6 oz (90.3 kg)   Height: 4\' 11"  (1.499 m)    Physical Exam: Constitutional: Alert, female sitting on exam table, in no  acute distress Cardiovascular: regular rate and rhythm Pulmonary/Chest: normal work of breathing on room air, breath sounds clear Abdominal: bowel sounds present, soft, non-distended, diffuse abdominal wall tenderness, no guarding Neurological: alert & oriented x 3, no focal deficits Skin: warm and dry  Assessment & Plan:   Essential hypertension BP remains elevated on repeat at 154/81.  She is taking lisinopril 10 mg daily.  She  reports not on spironolactone after it was discontinued for few months, possibly due to hyperkalemia.  Denies chest pain, shortness of breath or vision changes.   Plan -Increase lisinopril to 20 mg daily -Reassess BP at next OV, check BMP then  Abdominal wall pain Presents with chronic abdominal spasms that she reports for almost a year.  States pain/spasms are diffusely over her abdomen wraps to her.  Denies vomiting, constipation or diarrhea. Has tried Flexeril without much relief.  Sometimes she has muscle spasms in UE and LE. Abdominal exam today suggestive more of abdominal wall tenderness. Reports hx of cervical and lumbar fusion surgery which is seen on prior imaging. Imaging to assess for possible anterior cutaneous nerve entrapment.   Plan -Start tizanidine 2 mg every 8 hours PRN for spasms -T-spine xray   Controlled type 2 diabetes mellitus with diabetic retinopathy (HCC) Last A1c 6.6% in May. She is on Tresiba 15 units BID and Humalog 5 units TID AC. At last OV, transition from Ozempic to [facility] 2.5 mg weekly. States tolerating well with good adherence. Pending eye exam records.   Plan -Continue Evaristo Bury, Humalog and Mounjaro  -Recheck A1c in 1.5 month    Patient discussed with Dr. Layne Benton, D.O. [facility] Internal [facility], PGY-2 Phone: [phone] Date 04/20/2023 Time 9:39 AM

## 2023-04-20 NOTE — Patient Instructions (Addendum)
Thank you, Ms.Wai V Kalina for allowing [place] to provide your care today. Today we discussed your abdominal spasms and blood pressure and diabetes.  -We will do imaging of your spine to see if any causes for your abdominal muscle spasms. -Will send prescription for Tizanidine to pharmacy. Please take it as needed for the spasms and contact us if any concerns for side effects. Try not to take it regularly.  -Blood pressure still elevated. Will increase Lisinopril to 20 mg daily.  -Contact our office if you notice any low blood sugars.  -See you back in the office in 1 month.  Follow up:  1 month    Should you have any questions or concerns please call the internal medicine clinic at [phone].    Rana Snare, D.O. [facility]

## 2023-04-20 NOTE — Progress Notes (Signed)
Subjective:   Kara Kelley is a 67 y.o. female who presents for an Initial Medicare Annual Wellness Visit.  Visit Complete: In person  Patient Medicare AWV questionnaire was completed by the patient on 04/20/2023; I have confirmed that all information answered by patient is correct and no changes since this date.  Review of Systems    Defer to PCP       Objective:    Today's Vitals   04/20/23 1048 04/20/23 1049  BP: (!) 166/90 (!) 154/81  Pulse: 68   Temp: 97.8 F (36.6 C)   TempSrc: Oral   SpO2: 96%   Weight: 199 lb 1.6 oz (90.3 kg)   Height: 4\' 11"  (1.499 m)   PainSc: 8     Body mass index is 40.21 kg/m.     04/20/2023   10:51 AM 04/20/2023    9:13 AM 03/01/2023    3:33 PM 09/08/2022    3:44 PM 08/24/2022    6:00 PM 08/19/2022    6:00 PM 07/13/2022    6:11 PM  Advanced Directives  Does Patient Have a Medical Advance Directive? No No No No No No No  Does patient want to make changes to medical advance directive? Yes (ED - Information included in AVS) Yes (ED - Information included in AVS)       Would patient like information on creating a medical advance directive?   Yes (MAU/Ambulatory/Procedural Areas - Information given) Yes (MAU/Ambulatory/Procedural Areas - Information given) No - Patient declined No - Patient declined     Current Medications (verified) Outpatient Encounter Medications as of 04/20/2023  Medication Sig   Accu-Chek FastClix Lancets MISC Use as directed to check blood sugars 2 times per day dx: e11.65   Accu-Chek Softclix Lancets lancets Use as instructed to check blood sugars twice daily E11.69   acetaminophen (TYLENOL) 325 MG tablet Take 1-2 tablets (325-650 mg total) by mouth every 4 (four) hours as needed for mild pain.   albuterol (VENTOLIN HFA) 108 (90 Base) MCG/ACT inhaler Inhale 2 puffs into the lungs every 6 (six) hours as needed for wheezing or shortness of breath. (Patient taking differently: Inhale 2 puffs into the lungs 2 (two)  times daily as needed for wheezing or shortness of breath.)   ARIPiprazole (ABILIFY) 10 MG tablet Take 10 mg by mouth daily.    atorvastatin (LIPITOR) 10 MG tablet Take 1 tablet (10 mg total) by mouth daily.   Blood Glucose Monitoring Suppl (ACCU-CHEK GUIDE ME) w/Device KIT Use to check blood sugars twice daily E11.69   Blood Glucose Monitoring Suppl DEVI 1 each by Does not apply route in the morning, at noon, and at bedtime. May substitute to any manufacturer covered by patient's insurance.   buPROPion (WELLBUTRIN XL) 300 MG 24 hr tablet Take 1 tablet (300 mg total) by mouth daily. (Patient taking differently: Take 300 mg by mouth in the morning and at bedtime.)   cyclobenzaprine (FLEXERIL) 10 MG tablet TAKE 1 TABLET BY MOUTH THREE TIMES DAILY   diclofenac Sodium (VOLTAREN) 1 % GEL Apply 4 g topically 4 (four) times daily.   fluticasone furoate-vilanterol (BREO ELLIPTA) 200-25 MCG/ACT AEPB May finish hospital dispensed container, then resume home Advair as prescribed.   fluticasone-salmeterol (ADVAIR DISKUS) 250-50 MCG/ACT AEPB Inhale 1 puff into the lungs in the morning and at bedtime.   glucose blood (ONETOUCH ULTRA) test strip TEST EVERY MORNING AT NOON AND AT BEDTIME   hydrOXYzine (ATARAX) 25 MG tablet Take 1 tablet (25 mg  total) by mouth every 8 (eight) hours as needed for anxiety.   insulin degludec (TRESIBA) 100 UNIT/ML FlexTouch Pen Inject 15 Units into the skin 2 (two) times daily.   insulin lispro (HUMALOG) 100 UNIT/ML KwikPen Inject 5 Units into the skin 3 (three) times daily with meals.   Insulin Pen Needle (PEN NEEDLES) 31G X 8 MM MISC UAD   Insulin Syringe-Needle U-100 (INSULIN SYRINGE .3CC/30GX5/16") 30G X 5/16" 0.3 ML MISC Use to inject Levemir twice daily.   Iron, Ferrous Sulfate, 325 (65 Fe) MG TABS Take 325 mg by mouth daily.   Lancet Device MISC 1 each by Does not apply route in the morning, at noon, and at bedtime. May substitute to any manufacturer covered by patient's  insurance.   LINZESS 145 MCG CAPS capsule Take 145 mcg by mouth daily before breakfast.    metFORMIN (GLUCOPHAGE-XR) 500 MG 24 hr tablet TAKE 1 TABLET(500 MG) BY MOUTH DAILY WITH BREAKFAST   methimazole (TAPAZOLE) 5 MG tablet Take 1 tablet (5 mg total) by mouth daily.   Multiple Vitamins-Minerals (MULTIVITAMIN WITH MINERALS) tablet Take 1 tablet by mouth daily. Alive   omeprazole (PRILOSEC) 40 MG capsule TAKE 1 CAPSULE(40 MG) BY MOUTH DAILY BEFORE BREAKFAST   ondansetron (ZOFRAN-ODT) 4 MG disintegrating tablet Take 1 tablet (4 mg total) by mouth every 8 (eight) hours as needed for nausea or vomiting.   promethazine (PHENERGAN) 25 MG tablet Take 1 tablet (25 mg total) by mouth every 8 (eight) hours as needed for nausea or vomiting.   senna-docusate (SENOKOT-S) 8.6-50 MG tablet Take 1 tablet by mouth 2 (two) times daily.   spironolactone (ALDACTONE) 50 MG tablet Take 50 mg by mouth daily.   sucralfate (CARAFATE) 1 GM/10ML suspension SHAKE LIQUID AND TAKE 10 ML(1 GRAM) BY MOUTH TWICE DAILY   tirzepatide (MOUNJARO) 2.5 MG/0.5ML Pen Inject 2.5 mg into the skin once a week.   traZODone (DESYREL) 100 MG tablet Take 1.5 tablets (150 mg total) by mouth at bedtime as needed for sleep.   VITAMIN D PO Take 1 capsule by mouth daily.   No facility-administered encounter medications on file as of 04/20/2023.    Allergies (verified) Patient has no known allergies.   History: Past Medical History:  Diagnosis Date   Anemia    Anxiety    Arthritis    Asthma    Bacterial vaginosis 05/16/2022   Bunion    Callus    Candida vaginitis 09/16/2022   Cholelithiasis without obstruction 04/11/2022   Chronic pain    Cocaine abuse (HCC)    COPD (chronic obstructive pulmonary disease) (HCC)    Corns and callosities    Degenerative joint disease    Depression    Diabetes mellitus    Endometrial polyp    ETOH abuse    Gall stones    Gallbladder calculus with acute cholecystitis and no obstruction 04/11/2022    GERD (gastroesophageal reflux disease)    Headache    history of Migraines   Hepatitis C    Hep C   History of adenomatous polyp of colon 04/11/2022   History of endometrial ablation 02/06/2013   Hyperlipidemia    Hypertension    Insomnia    Spondylolisthesis of lumbar region    Substance abuse (HCC)    alcoholism   Tuberculosis 1985   Wears dentures    Wears glasses    Past Surgical History:  Procedure Laterality Date   ANTERIOR CERVICAL DECOMP/DISCECTOMY FUSION N/A 10/26/2021   Procedure: ACDF - C4-C5 -  C5-C6 - C6-C7;  Surgeon: Julio Sicks, MD;  Location: [facility] OR;  Service: Neurosurgery;  Laterality: N/A;   BACK SURGERY  2018   CESAREAN SECTION     x3   CHOLECYSTECTOMY N/A 08/11/2016   Procedure: LAPAROSCOPIC CHOLECYSTECTOMY WITH   INTRAOPERATIVE CHOLANGIOGRAM;  Surgeon: Almond Lint, MD;  Location: [facility] ORS;  Service: General;  Laterality: N/A;   COLONOSCOPY     Cotton Osteotomy w/ Graft Left 06/18/2009   Excision of Benign Lesion Right 01/30/2013   Rt Plantar   FOOT SURGERY     HAMMER TOE REPAIR Right 02/12/2016   RIGHT #5   Hammertoe Repair Left 06/18/2009   Lt #5   HYSTEROSCOPY N/A 06/20/2017   Procedure: DILATION AND CURETTAGE, HYSTEROSCOPY w/ Polypectomy;  Surgeon: Willodean Rosenthal, MD;  Location: [facility] ORS;  Service: Gynecology;  Laterality: N/A;   LUMBAR FUSION  2018   METATARSAL OSTEOTOMY Left 06/18/2009   #5   MULTIPLE TOOTH EXTRACTIONS     Nail Matrixectomy Left 06/18/2009   LT #1   OSTEOTOMY Right 01/30/2013   Rt #5   Phalangectomy Left 06/18/2009   LT #1   Phalangectomy Right 01/30/2013   Rt #1   TUBAL LIGATION     Family History  Problem Relation Age of Onset   Heart disease Mother    Diabetes Other        mat great aunt   Cirrhosis Other        mat great aunt   Cirrhosis Other        mat great uncles x 2   Colon cancer Neg Hx    Breast cancer Neg Hx    Social History   Socioeconomic History   Marital status: Single    Spouse name: Not on file    Number of children: 3   Years of education: Not on file   Highest education level: Not on file  Occupational History   Occupation: disablily  Tobacco Use   Smoking status: Former    Current packs/day: 0.00    Average packs/day: 0.8 packs/day for 47.0 years (35.3 ttl pk-yrs)    Types: Cigarettes    Start date: 7    Quit date: 2020    Years since quitting: 4.5    Passive exposure: Past   Smokeless tobacco: Never  Vaping Use   Vaping status: Never Used  Substance and Sexual Activity   Alcohol use: No    Alcohol/week: 0.0 standard drinks of alcohol    Comment: in rehab   Drug use: No    Comment: in rehab   Sexual activity: Not Currently    Birth control/protection: Post-menopausal  Other Topics Concern   Not on file  Social History Narrative   Not on file   Social Determinants of Health   Financial Resource Strain: Low Risk  (04/20/2023)   Overall Financial Resource Strain (CARDIA)    Difficulty of Paying Living Expenses: Not hard at all  Food Insecurity: No Food Insecurity (04/20/2023)   Hunger Vital Sign    Worried About Running Out of Food in the Last Year: Never true    Ran Out of Food in the Last Year: Never true  Transportation Needs: No Transportation Needs (04/20/2023)   PRAPARE - Administrator, Civil Service (Medical): No    Lack of Transportation (Non-Medical): No  Physical Activity: Sufficiently Active (04/20/2023)   Exercise Vital Sign    Days of Exercise per Week: 3 days    Minutes of Exercise  per Session: 60 min  Stress: No Stress Concern Present (04/20/2023)   [employer] of Occupational Health - Occupational Stress Questionnaire    Feeling of Stress : Only a little  Social Connections: Moderately Integrated (04/20/2023)   Social Connection and Isolation Panel [NHANES]    Frequency of Communication with Friends and Family: More than three times a week    Frequency of Social Gatherings with Friends and Family: More than three times a  week    Attends Religious Services: More than 4 times per year    Active Member of [employer] or Organizations: Yes    Attends Banker Meetings: More than 4 times per year    Marital Status: Widowed    Tobacco Counseling Counseling given: Not Answered   Clinical Intake:  Pre-visit preparation completed: Yes  Pain : 0-10 Pain Score: 8  Pain Type: Acute pain Pain Location: Abdomen Pain Radiating Towards: arms and legs Pain Descriptors / Indicators: Spasm, Cramping Pain Onset: More than a month ago Pain Frequency: Constant Pain Relieving Factors: tylenol Effect of Pain on Daily Activities: constant  Pain Relieving Factors: tylenol  Nutritional Risks: None Diabetes: Yes CBG done?: No CBG resulted in Enter/ Edit results?: No Did pt. bring in CBG monitor from home?: No  How often do you need to have someone help you when you read instructions, pamphlets, or other written materials from your doctor or pharmacy?: 1 - Never What is the last grade level you completed in school?: 10th grade  Interpreter Needed?: No  Information entered by :: Lucillia Corson,cma   Activities of Daily Living    04/20/2023   10:50 AM 04/20/2023    9:13 AM  In your present state of health, do you have any difficulty performing the following activities:  Hearing? 0 0  Vision? 0 0  Difficulty concentrating or making decisions? 0 0  Walking or climbing stairs? 1 1  Dressing or bathing? 0 0  Doing errands, shopping? 1 1    Patient Care Team: Modena Slater, DO as PCP - General  Indicate any recent Medical Services you may have received from other than [facility] providers in the past year (date may be approximate).     Assessment:   This is a routine wellness examination for Blayke.  Hearing/Vision screen No results found.  Dietary issues and exercise activities discussed:     Goals Addressed   None   Depression Screen    03/01/2023    3:40 PM 02/10/2023   10:25 AM 09/15/2022    10:09 AM 08/19/2022   10:21 AM 05/13/2022    9:41 AM 04/11/2022    1:45 PM 02/14/2022    9:13 AM  PHQ 2/9 Scores  PHQ - 2 Score 2 2 0 0 0 0 0  PHQ- 9 Score 14 10    9 10     Fall Risk    04/20/2023   10:51 AM 04/20/2023    9:12 AM 03/01/2023    3:31 PM 02/10/2023    9:06 AM 11/09/2022    3:36 PM  Fall Risk   Falls in the past year? 1 1 1 1  0  Number falls in past yr: 1 1 1 1  0  Injury with Fall? 1 1 1 1  0  Risk for fall due to : History of fall(s);Impaired balance/gait Impaired balance/gait;Other (Comment);History of fall(s) History of fall(s);Impaired balance/gait  History of fall(s)  Risk for fall due to: Comment  hurt knee and back legs give out.  Follow up Falls evaluation completed;Falls prevention discussed Falls evaluation completed;Falls prevention discussed Falls evaluation completed;Falls prevention discussed  Falls evaluation completed    MEDICARE RISK AT HOME:  Medicare Risk at Home - 04/20/23 1053     Any stairs in or around the home? Yes    If so, are there any without handrails? No    Home free of loose throw rugs in walkways, pet beds, electrical cords, etc? Yes    Adequate lighting in your home to reduce risk of falls? Yes    Life alert? No    Use of a cane, walker or w/c? Yes    Grab bars in the bathroom? No    Shower chair or bench in shower? Yes    Elevated toilet seat or a handicapped toilet? Yes             TIMED UP AND GO:  Was the test performed? No    Cognitive Function:        04/20/2023   10:52 AM  6CIT Screen  What Year? 0 points  What month? 0 points  What time? 0 points  Count back from 20 0 points  Months in reverse 0 points  Repeat phrase 0 points  Total Score 0 points    Immunizations Immunization History  Administered Date(s) Administered   Fluad Quad(high Dose 65+) 08/19/2022   Hep A / Hep B 01/01/2018, 01/29/2018, 09/28/2018   Influenza,inj,Quad PF,6+ Mos 06/27/2014, 06/19/2019, 06/23/2020, 06/29/2021   PFIZER(Purple  Top)SARS-COV-2 Vaccination 12/30/2019, 01/22/2020, 09/07/2020, 01/30/2021   Pneumococcal Polysaccharide-23 06/06/2014, 08/02/2022   Tdap 08/02/2012   Zoster Recombinant(Shingrix) 12/01/2021, 01/24/2022    TDAP status: Due, Education has been provided regarding the importance of this vaccine. Advised may receive this vaccine at local pharmacy or Health Dept. Aware to provide a copy of the vaccination record if obtained from local pharmacy or Health Dept. Verbalized acceptance and understanding.  Flu Vaccine status: Up to date  Pneumococcal vaccine status: Up to date  Covid-19 vaccine status: Information provided on how to obtain vaccines.   Qualifies for Shingles Vaccine? Yes   Zostavax completed No   Shingrix Completed?: Yes  Screening Tests Health Maintenance  Topic Date Due   OPHTHALMOLOGY EXAM  12/09/2020   Colonoscopy  11/16/2021   COVID-19 Vaccine (5 - 2023-24 season) 06/10/2022   DTaP/Tdap/Td (2 - Td or Tdap) 08/02/2022   DEXA SCAN  Never done   Lung Cancer Screening  05/19/2023   INFLUENZA VACCINE  05/11/2023   Pneumonia Vaccine 43+ Years old (2 of 2 - PCV) 08/03/2023   HEMOGLOBIN A1C  09/01/2023   Diabetic kidney evaluation - eGFR measurement  02/29/2024   Diabetic kidney evaluation - Urine ACR  02/29/2024   FOOT EXAM  02/29/2024   Medicare Annual Wellness (AWV)  04/19/2024   MAMMOGRAM  01/04/2025   PAP SMEAR-Modifier  02/28/2026   Hepatitis C Screening  Completed   HIV Screening  Completed   Zoster Vaccines- Shingrix  Completed   HPV VACCINES  Aged Out    Health Maintenance  Health Maintenance Due  Topic Date Due   OPHTHALMOLOGY EXAM  12/09/2020   Colonoscopy  11/16/2021   COVID-19 Vaccine (5 - 2023-24 season) 06/10/2022   DTaP/Tdap/Td (2 - Td or Tdap) 08/02/2022   DEXA SCAN  Never done   Lung Cancer Screening  05/19/2023     Mammogram status: Completed 01/05/2023. Repeat every year:2  Bone Density status: Ordered 03/01/2023. Pt provided with contact  info and advised to  call to schedule appt.  Lung Cancer Screening: (Low Dose CT Chest recommended if Age 74-80 years, 20 pack-year currently smoking OR have quit w/in 15years.) does not qualify.   Lung Cancer Screening Referral: N/A  Additional Screening:  Hepatitis C Screening: does not qualify; Completed 06/23/2020  Vision Screening: Recommended annual ophthalmology exams for early detection of glaucoma and other disorders of the eye. Is the patient up to date with their annual eye exam?  Yes  Who is the provider or what is the name of the office in which the patient attends annual eye exams? N/A If pt is not established with a provider, would they like to be referred to a provider to establish care? Yes .   Dental Screening: Recommended annual dental exams for proper oral hygiene  Diabetic Foot Exam: Diabetic Foot Exam: Completed 03/01/2023  Community Resource Referral / Chronic Care Management: CRR required this visit?  No   CCM required this visit?  No     Plan:     I have personally reviewed and noted the following in the patient's chart:   Medical and social history Use of alcohol, tobacco or illicit drugs  Current medications and supplements including opioid prescriptions. Patient is not currently taking opioid prescriptions. Functional ability and status Nutritional status Physical activity Advanced directives List of other physicians Hospitalizations, surgeries, and ER visits in previous 12 months Vitals Screenings to include cognitive, depression, and falls Referrals and appointments  In addition, I have reviewed and discussed with patient certain preventive protocols, quality metrics, and best practice recommendations. A written personalized care plan for preventive services as well as general preventive health recommendations were provided to patient.     Cala Bradford, [name]   04/20/2023   After Visit Summary: (MyChart) Due to this being a telephonic visit,  the after visit summary with patients personalized plan was offered to patient via MyChart   Nurse Notes: Face-To-Face Visit  Ms. Leedom , Thank you for taking time to come for your Medicare Wellness Visit. I appreciate your ongoing commitment to your health goals. Please review the following plan we discussed and let me know if I can assist you in the future.   These are the goals we discussed:  Goals   None     This is a list of the screening recommended for you and due dates:  Health Maintenance  Topic Date Due   Eye exam for diabetics  12/09/2020   Colon Cancer Screening  11/16/2021   COVID-19 Vaccine (5 - 2023-24 season) 06/10/2022   DTaP/Tdap/Td vaccine (2 - Td or Tdap) 08/02/2022   DEXA scan (bone density measurement)  Never done   Screening for Lung Cancer  05/19/2023   Flu Shot  05/11/2023   Pneumonia Vaccine (2 of 2 - PCV) 08/03/2023   Hemoglobin A1C  09/01/2023   Yearly kidney function blood test for diabetes  02/29/2024   Yearly kidney health urinalysis for diabetes  02/29/2024   Complete foot exam   02/29/2024   Medicare Annual Wellness Visit  04/19/2024   Mammogram  01/04/2025   Pap Smear  02/28/2026   Hepatitis C Screening  Completed   HIV Screening  Completed   Zoster (Shingles) Vaccine  Completed   HPV Vaccine  Aged Out

## 2023-04-21 DIAGNOSIS — R109 Unspecified abdominal pain: Secondary | ICD-10-CM | POA: Insufficient documentation

## 2023-04-21 NOTE — Assessment & Plan Note (Signed)
BP remains elevated on repeat at 154/81.  She is taking lisinopril 10 mg daily.  She reports not on spironolactone after it was discontinued for few months, possibly due to hyperkalemia.  Denies chest pain, shortness of breath or vision changes.   Plan -Increase lisinopril to 20 mg daily -Reassess BP at next OV, check BMP then

## 2023-04-21 NOTE — Assessment & Plan Note (Signed)
Last A1c 6.6% in May. She is on Tresiba 15 units BID and Humalog 5 units TID AC. At last OV, transition from Ozempic to [facility] 2.5 mg weekly. States tolerating well with good adherence. Pending eye exam records.   Plan -Continue Evaristo Bury, Humalog and Mounjaro  -Recheck A1c in 1.5 month

## 2023-04-21 NOTE — Assessment & Plan Note (Addendum)
Presents with chronic abdominal spasms that she reports for almost a year.  States pain/spasms are diffusely over her abdomen wraps to her.  Denies vomiting, constipation or diarrhea. Has tried Flexeril without much relief.  Sometimes she has muscle spasms in UE and LE. Abdominal exam today suggestive more of abdominal wall tenderness. Reports hx of cervical and lumbar fusion surgery which is seen on prior imaging. Imaging to assess for possible anterior cutaneous nerve entrapment.   Plan -Start tizanidine 2 mg every 8 hours PRN for spasms -T-spine xray

## 2023-04-24 ENCOUNTER — Encounter: Payer: 59 | Admitting: Dietician

## 2023-04-25 ENCOUNTER — Ambulatory Visit (INDEPENDENT_AMBULATORY_CARE_PROVIDER_SITE_OTHER): Payer: 59

## 2023-04-25 ENCOUNTER — Ambulatory Visit (INDEPENDENT_AMBULATORY_CARE_PROVIDER_SITE_OTHER): Payer: 59 | Admitting: Podiatry

## 2023-04-25 DIAGNOSIS — M2062 Acquired deformities of toe(s), unspecified, left foot: Secondary | ICD-10-CM

## 2023-04-25 DIAGNOSIS — B351 Tinea unguium: Secondary | ICD-10-CM | POA: Diagnosis not present

## 2023-04-25 DIAGNOSIS — M79674 Pain in right toe(s): Secondary | ICD-10-CM | POA: Diagnosis not present

## 2023-04-25 DIAGNOSIS — S92302D Fracture of unspecified metatarsal bone(s), left foot, subsequent encounter for fracture with routine healing: Secondary | ICD-10-CM | POA: Diagnosis not present

## 2023-04-25 DIAGNOSIS — M79675 Pain in left toe(s): Secondary | ICD-10-CM

## 2023-04-27 ENCOUNTER — Other Ambulatory Visit: Payer: Self-pay | Admitting: Family

## 2023-04-27 DIAGNOSIS — N1832 Chronic kidney disease, stage 3b: Secondary | ICD-10-CM

## 2023-04-27 NOTE — Progress Notes (Signed)
Internal Medicine Clinic Attending  Case discussed with the resident at the time of the visit.  We reviewed the resident's history and exam and pertinent patient test results.  I agree with the assessment, diagnosis, and plan of care documented in the resident's note.  

## 2023-04-30 NOTE — Progress Notes (Signed)
Subjective: Chief Complaint  Patient presents with   Foot Pain    Left foot injury follow-up, X-Rays     67 year old female presents for above concerns.  Since she does not have pain to the subtalar unit does not help as it kept coming out.  No new injuries.  Her nails are also thick and elongated she cannot trim them herself because of discomfort.  No open lesions.    Objective: AAO x3, NAD DP/PT pulses palpable bilaterally, CRT less than 3 seconds There is trace edema to the left fifth toe There is no area of pinpoint tenderness particular the fifth toe at the toe does sit dorsiflexed somewhat over  Nails are hypertrophic, dystrophic, brittle, discolored, elongated 8. No surrounding redness or drainage. Tenderness nails 2-5 bilaterally. No open lesions or pre-ulcerative lesions are identified today. No pain with calf compression, swelling, warmth, erythema  Assessment: 67 year old female contusion, possible fracture left fifth toe; symptomatic onychomycosis  Plan: -All treatment options discussed with the patient including all alternatives, risks, complications.  -X-rays were obtained reviewed.  3 views of the foot were obtained of the left side.  Status post bunionectomy with hardware intact.  Status post fifth metatarsal head resection.  No definitive evidence of acute fracture. -I dispensed a different toe separator, more of a tailor's bunion pad to help hold the 5th toe and offload.  -Debrided nails x 10 without any complications or bleeding -Patient encouraged to call the office with any questions, concerns, change in symptoms.   Vivi Barrack DPM

## 2023-05-04 NOTE — Progress Notes (Signed)
Internal Medicine Clinic Attending  Case, documentation, and findings reviewed. I agree with the assessment, diagnosis, and plan of care as outlined in the AWV note.     

## 2023-05-08 ENCOUNTER — Other Ambulatory Visit: Payer: Self-pay | Admitting: Family

## 2023-05-08 DIAGNOSIS — I10 Essential (primary) hypertension: Secondary | ICD-10-CM

## 2023-05-09 NOTE — Telephone Encounter (Signed)
Call patient with update. Lisinopril was discontinued and increased on 04/20/2023 by Rana Snare, DO at [facility]. Please ask patient to consult with the same if appropriate for medication refill by them. Please let me know if I can further assist. Thank you.

## 2023-05-20 ENCOUNTER — Other Ambulatory Visit: Payer: Self-pay | Admitting: Family Medicine

## 2023-05-22 ENCOUNTER — Other Ambulatory Visit: Payer: Self-pay

## 2023-05-22 ENCOUNTER — Ambulatory Visit
Admission: RE | Admit: 2023-05-22 | Discharge: 2023-05-22 | Disposition: A | Discharge status: Home / Self Care | Payer: 59 | Source: Ambulatory Visit | Attending: Acute Care | Admitting: Acute Care

## 2023-05-22 DIAGNOSIS — Z87891 Personal history of nicotine dependence: Secondary | ICD-10-CM

## 2023-05-22 DIAGNOSIS — N1832 Chronic kidney disease, stage 3b: Secondary | ICD-10-CM

## 2023-05-22 DIAGNOSIS — Z122 Encounter for screening for malignant neoplasm of respiratory organs: Secondary | ICD-10-CM

## 2023-05-22 MED ORDER — ONETOUCH ULTRA VI STRP: ORAL_STRIP | 0 refills | Status: DC

## 2023-05-23 ENCOUNTER — Encounter: Payer: 59 | Admitting: Student

## 2023-05-23 ENCOUNTER — Other Ambulatory Visit: Payer: Self-pay | Admitting: Family

## 2023-05-23 ENCOUNTER — Telehealth: Payer: Self-pay | Admitting: Family

## 2023-05-23 ENCOUNTER — Telehealth: Payer: Self-pay

## 2023-05-23 NOTE — Telephone Encounter (Signed)
Patient called she stated she found her CBG meter.[name]

## 2023-05-23 NOTE — Telephone Encounter (Signed)
Noted* future appts have been canceled.

## 2023-05-23 NOTE — Telephone Encounter (Signed)
There is a refill pending on this patient for a meter I na family practice note in this chart. She is a family practice patient.

## 2023-05-23 NOTE — Telephone Encounter (Signed)
Noted  

## 2023-05-23 NOTE — Telephone Encounter (Signed)
Copied from CRM [id number]. Topic: General - Other >> May 23, 2023  3:26 PM Epimenio Foot F wrote: Reason for CRM: Pt is calling in to inform the office that she is being seen at Internal Medicine now.

## 2023-05-23 NOTE — Telephone Encounter (Signed)
Medication Refill - Medication: Blood Glucose Monitoring Suppl (ACCU-CHEK GUIDE ME) w/Device KIT   Has the patient contacted their pharmacy? No.   Preferred Pharmacy (with phone number or street name):  [facility] [phone] - [place], [place] - [address] Phone: [phone]  Fax: [phone]     Has the patient been seen for an appointment in the last year OR does the patient have an upcoming appointment? Yes.    Agent: Please be advised that RX refills may take up to 3 business days. We ask that you follow-up with your pharmacy.  Patient called stated she went on vacation and left her meter behind. Patient has no way of checking her sugar numbers

## 2023-05-23 NOTE — Telephone Encounter (Signed)
Pt is requesting a call back .Kara Kelley She stated that she  went on vacation  and left her meter and she is in need of a new one

## 2023-05-25 ENCOUNTER — Other Ambulatory Visit: Payer: Self-pay | Admitting: Student

## 2023-05-25 ENCOUNTER — Other Ambulatory Visit: Payer: Self-pay | Admitting: Acute Care

## 2023-05-25 DIAGNOSIS — Z87891 Personal history of nicotine dependence: Secondary | ICD-10-CM

## 2023-05-25 DIAGNOSIS — Z122 Encounter for screening for malignant neoplasm of respiratory organs: Secondary | ICD-10-CM

## 2023-05-25 NOTE — Telephone Encounter (Signed)
Requested medications are due for refill today.  unsure  Requested medications are on the active medications list.  yes  Last refill. 07/20/2021   Future visit scheduled.   no  Notes to clinic.  Rx was signed by Arnette Felts. Pt is requesting a new monitor.    Requested Prescriptions  Pending Prescriptions Disp Refills   Blood Glucose Monitoring Suppl (ACCU-CHEK GUIDE ME) w/Device KIT 1 kit 1    Sig: Use to check blood sugars twice daily E11.69     Endocrinology: Diabetes - Testing Supplies Passed - 05/23/2023  2:28 PM      Passed - Valid encounter within last 12 months    Recent Outpatient Visits           3 months ago Gastroesophageal reflux disease, unspecified whether esophagitis present   [facility], [place], NP   5 months ago Primary hypertension   [facility], Amy J, NP   6 months ago Bilateral arm pain   [facility], [place], NP   8 months ago Hospital discharge follow-up   [facility], Amy J, NP   9 months ago Acute right-sided low back pain, unspecified whether sciatica present   [facility], Salomon Fick, NP

## 2023-05-26 NOTE — Telephone Encounter (Signed)
Patient is already prescribed flexeril

## 2023-05-29 ENCOUNTER — Ambulatory Visit: Payer: 59 | Admitting: Podiatry

## 2023-05-29 ENCOUNTER — Telehealth: Payer: Self-pay | Admitting: Student

## 2023-05-29 NOTE — Telephone Encounter (Signed)
Pt states she [facility]23 and tested positive for Covid and is still testing positive as of this past Saturday 05/27/2023.  Pt states she was instructed to call her PCP office as she is in a Day Program and can't go back until she has a negative Test.  Pt states she is feeling well and is having no symptoms.

## 2023-05-29 NOTE — Telephone Encounter (Signed)
Pt was called - stated she was tested covid+ on the 12th. She was prescribed and took Paxlovid. Re-tested Sat  at [facility] on [address]; tested + again. Stated she feels fine; denies sob, fever, cp.h/a's,loss of taste. Informed pt to re-test herself ; stated her sister will bring her a testing kit or she will go to the pharmacy. Stated she goes to a Environmental manager and she will need a letter stating she's negative (told her to let us know when she's negative).

## 2023-06-06 ENCOUNTER — Encounter: Payer: Self-pay | Admitting: Internal Medicine

## 2023-06-06 ENCOUNTER — Ambulatory Visit: Payer: 59 | Admitting: Internal Medicine

## 2023-06-08 ENCOUNTER — Ambulatory Visit (INDEPENDENT_AMBULATORY_CARE_PROVIDER_SITE_OTHER): Payer: 59 | Admitting: Student

## 2023-06-08 ENCOUNTER — Encounter: Payer: Self-pay | Admitting: Student

## 2023-06-08 ENCOUNTER — Other Ambulatory Visit: Payer: Self-pay

## 2023-06-08 VITALS — BP 134/70 | HR 73 | Temp 98.2°F | Ht 60.0 in | Wt 205.5 lb

## 2023-06-08 DIAGNOSIS — E1165 Type 2 diabetes mellitus with hyperglycemia: Secondary | ICD-10-CM | POA: Diagnosis not present

## 2023-06-08 DIAGNOSIS — Z23 Encounter for immunization: Secondary | ICD-10-CM

## 2023-06-08 DIAGNOSIS — G4700 Insomnia, unspecified: Secondary | ICD-10-CM

## 2023-06-08 DIAGNOSIS — M48061 Spinal stenosis, lumbar region without neurogenic claudication: Secondary | ICD-10-CM | POA: Diagnosis not present

## 2023-06-08 DIAGNOSIS — Z794 Long term (current) use of insulin: Secondary | ICD-10-CM

## 2023-06-08 DIAGNOSIS — Z7984 Long term (current) use of oral hypoglycemic drugs: Secondary | ICD-10-CM | POA: Diagnosis not present

## 2023-06-08 DIAGNOSIS — I10 Essential (primary) hypertension: Secondary | ICD-10-CM | POA: Diagnosis not present

## 2023-06-08 DIAGNOSIS — E11311 Type 2 diabetes mellitus with unspecified diabetic retinopathy with macular edema: Secondary | ICD-10-CM

## 2023-06-08 DIAGNOSIS — D509 Iron deficiency anemia, unspecified: Secondary | ICD-10-CM

## 2023-06-08 DIAGNOSIS — Z87891 Personal history of nicotine dependence: Secondary | ICD-10-CM

## 2023-06-08 LAB — GLUCOSE, CAPILLARY: Glucose-Capillary: 123 mg/dL — ABNORMAL HIGH (ref 70–99)

## 2023-06-08 LAB — POCT GLYCOSYLATED HEMOGLOBIN (HGB A1C): Hemoglobin A1C: 8.2 % — AB (ref 4.0–5.6)

## 2023-06-08 MED ORDER — TRAZODONE HCL 50 MG PO TABS: 50.0000 mg | ORAL_TABLET | Freq: Every evening | ORAL | Status: DC | PRN

## 2023-06-08 MED ORDER — LIDOCAINE 4 % EX PTCH
1.0000 | MEDICATED_PATCH | CUTANEOUS | 4 refills | Status: AC
Start: 2023-06-08 — End: 2023-07-08

## 2023-06-08 MED ORDER — NAPROXEN 500 MG PO TABS: 500.0000 mg | ORAL_TABLET | Freq: Two times a day (BID) | ORAL | 0 refills | Status: AC

## 2023-06-08 MED ORDER — DULOXETINE HCL 30 MG PO CPEP: 30.0000 mg | ORAL_CAPSULE | Freq: Every day | ORAL | 2 refills | Status: DC

## 2023-06-08 MED ORDER — TRAZODONE HCL 50 MG PO TABS
150.0000 mg | ORAL_TABLET | Freq: Every evening | ORAL | Status: DC | PRN
Start: 2023-06-08 — End: 2023-06-26

## 2023-06-08 MED ORDER — IRON (FERROUS SULFATE) 325 (65 FE) MG PO TABS
325.0000 mg | ORAL_TABLET | Freq: Every day | ORAL | 2 refills | Status: DC
Start: 2023-06-08 — End: 2024-08-08

## 2023-06-08 NOTE — Assessment & Plan Note (Addendum)
Patient has a past medical history of type 2 diabetes mellitus.  Her current regimen includes Tresiba 15 units twice daily and metformin 500 mg daily.  She also uses Humalog 5 units 3 times daily.  Patient per charting has been on [facility], but do not see this on the list, the patient is unsure about this medication.  Patient does not seem to know which medications that she is on, and which ones she is not on.  She has an extensive medication list, which I did try to address, but patient did not bring a medication bag, nor did she know which medication she is taking and which ones she is not taking.  Did recommend her to bring medications at next visit.  A1c today elevated at 8.2. Sugars at home: 130-135. Has not missed any doses of her insulin. She reports that she is drinking a lot and voiding a lot.   Plan: -Increase metformin to 1000 mg twice daily -Continue Tresiba 15 units twice daily -Continue Humalog 5 units 3 times daily -Recheck A1c at next visit in 3 months

## 2023-06-08 NOTE — Progress Notes (Addendum)
CC: Follow-up visit  HPI:  Kara Kelley is a 67 y.o. female with past medical history of hypertension, hyperthyroidism, type 2 diabetes who presents for follow-up visit.  Please see assessment and plan for full HPI.  Meds: Type 2 diabetes mellitus: Tresiba 15 units twice daily, metformin 500 mg daily, Humalog 5 units 3 times daily, supposed to be on [employer], do not see this on the list Vitamin D deficiency: Vitamin D supplementation Hypertension: Lisinopril 20 mg daily GERD: Omeprazole 40 mg daily Insomnia: Trazodone 150 mg nightly as needed Pain: Flexeril 10 mg 3 times daily Hyperlipidemia: Atorvastatin 10 mg daily  Past Medical History:  Diagnosis Date   Anemia    Anxiety    Arthritis    Asthma    Bacterial vaginosis 05/16/2022   Bunion    Callus    Candida vaginitis 09/16/2022   Cholelithiasis without obstruction 04/11/2022   Chronic pain    Cocaine abuse (HCC)    COPD (chronic obstructive pulmonary disease) (HCC)    Corns and callosities    Degenerative joint disease    Depression    Diabetes mellitus    Endometrial polyp    ETOH abuse    Gall stones    Gallbladder calculus with acute cholecystitis and no obstruction 04/11/2022   GERD (gastroesophageal reflux disease)    Headache    history of Migraines   Hepatitis C    Hep C   History of adenomatous polyp of colon 04/11/2022   History of endometrial ablation 02/06/2013   Hyperlipidemia    Hypertension    Insomnia    Spondylolisthesis of lumbar region    Substance abuse (HCC)    alcoholism   Tuberculosis 1985   Wears dentures    Wears glasses      Current Outpatient Medications:    DULoxetine (CYMBALTA) 30 MG capsule, Take 1 capsule (30 mg total) by mouth daily., Disp: 30 capsule, Rfl: 2   lidocaine (HM LIDOCAINE PATCH) 4 %, Place 1 patch onto the skin daily., Disp: 5 patch, Rfl: 4   naproxen (NAPROSYN) 500 MG tablet, Take 1 tablet (500 mg total) by mouth 2 (two) times daily with a meal for 7  days., Disp: 14 tablet, Rfl: 0   Accu-Chek FastClix Lancets MISC, Use as directed to check blood sugars 2 times per day dx: e11.65, Disp: 102 each, Rfl: prn   Accu-Chek Softclix Lancets lancets, Use as instructed to check blood sugars twice daily E11.69, Disp: 100 each, Rfl: 2   acetaminophen (TYLENOL) 325 MG tablet, Take 1-2 tablets (325-650 mg total) by mouth every 4 (four) hours as needed for mild pain., Disp: , Rfl:    albuterol (VENTOLIN HFA) 108 (90 Base) MCG/ACT inhaler, Inhale 2 puffs into the lungs every 6 (six) hours as needed for wheezing or shortness of breath. (Patient taking differently: Inhale 2 puffs into the lungs 2 (two) times daily as needed for wheezing or shortness of breath.), Disp: 18 g, Rfl: 2   ARIPiprazole (ABILIFY) 10 MG tablet, Take 10 mg by mouth daily. , Disp: , Rfl:    atorvastatin (LIPITOR) 10 MG tablet, Take 1 tablet (10 mg total) by mouth daily., Disp: 30 tablet, Rfl: 2   Blood Glucose Monitoring Suppl (ACCU-CHEK GUIDE ME) w/Device KIT, Use to check blood sugars twice daily E11.69, Disp: 1 kit, Rfl: 1   buPROPion (WELLBUTRIN XL) 300 MG 24 hr tablet, Take 1 tablet (300 mg total) by mouth daily. (Patient taking differently: Take 300 mg by  mouth in the morning and at bedtime.), Disp: 90 tablet, Rfl: 0   cyclobenzaprine (FLEXERIL) 10 MG tablet, TAKE 1 TABLET BY MOUTH THREE TIMES DAILY, Disp: 30 tablet, Rfl: 0   diclofenac Sodium (VOLTAREN) 1 % GEL, Apply 4 g topically 4 (four) times daily., Disp: , Rfl:    fluticasone-salmeterol (ADVAIR DISKUS) 250-50 MCG/ACT AEPB, Inhale 1 puff into the lungs in the morning and at bedtime., Disp: 60 each, Rfl: 6   glucose blood (ONETOUCH ULTRA) test strip, TEST EVERY MORNING AT NOON AND AT BEDTIME, Disp: 100 strip, Rfl: 0   hydrOXYzine (ATARAX) 25 MG tablet, Take 1 tablet (25 mg total) by mouth every 8 (eight) hours as needed for anxiety., Disp: 30 tablet, Rfl: 1   insulin lispro (HUMALOG) 100 UNIT/ML KwikPen, Inject 5 Units into the skin  3 (three) times daily with meals., Disp: 4.5 mL, Rfl: 11   Insulin Pen Needle (PEN NEEDLES) 31G X 8 MM MISC, UAD, Disp: 100 each, Rfl: 0   Insulin Syringe-Needle U-100 (INSULIN SYRINGE .3CC/30GX5/16") 30G X 5/16" 0.3 ML MISC, Use to inject Levemir twice daily., Disp: 100 each, Rfl: 2   Iron, Ferrous Sulfate, 325 (65 Fe) MG TABS, Take 325 mg by mouth daily., Disp: 30 tablet, Rfl: 2   Lancet Device MISC, 1 each by Does not apply route in the morning, at noon, and at bedtime. May substitute to any manufacturer covered by patient's insurance., Disp: 100 each, Rfl: 0   LINZESS 145 MCG CAPS capsule, Take 145 mcg by mouth daily before breakfast. , Disp: , Rfl:    lisinopril (ZESTRIL) 20 MG tablet, Take 1 tablet (20 mg total) by mouth daily., Disp: 30 tablet, Rfl: 11   metFORMIN (GLUCOPHAGE-XR) 500 MG 24 hr tablet, TAKE 1 TABLET(500 MG) BY MOUTH DAILY WITH BREAKFAST, Disp: 90 tablet, Rfl: 1   methimazole (TAPAZOLE) 5 MG tablet, Take 1 tablet (5 mg total) by mouth daily., Disp: 90 tablet, Rfl: 1   Multiple Vitamins-Minerals (MULTIVITAMIN WITH MINERALS) tablet, Take 1 tablet by mouth daily. Alive, Disp: , Rfl:    omeprazole (PRILOSEC) 40 MG capsule, TAKE 1 CAPSULE(40 MG) BY MOUTH DAILY BEFORE BREAKFAST, Disp: 90 capsule, Rfl: 0   ondansetron (ZOFRAN-ODT) 4 MG disintegrating tablet, Take 1 tablet (4 mg total) by mouth every 8 (eight) hours as needed for nausea or vomiting., Disp: 30 tablet, Rfl: 1   promethazine (PHENERGAN) 25 MG tablet, Take 1 tablet (25 mg total) by mouth every 8 (eight) hours as needed for nausea or vomiting., Disp: 30 tablet, Rfl: 2   traZODone (DESYREL) 50 MG tablet, Take 3 tablets (150 mg total) by mouth at bedtime as needed for sleep., Disp: , Rfl:    TRESIBA FLEXTOUCH 100 UNIT/ML FlexTouch Pen, ADMINISTER 15 UNITS UNDER THE SKIN TWICE DAILY, Disp: 27 mL, Rfl: 0   VITAMIN D PO, Take 1 capsule by mouth daily., Disp: , Rfl:   Review of Systems:    MSK: Patient endorses back  pain  Physical Exam:  Vitals:   06/08/23 0955  BP: 134/70  Pulse: 73  Temp: 98.2 F (36.8 C)  TempSrc: Oral  SpO2: 97%  Weight: 205 lb 8 oz (93.2 kg)  Height: 5' (1.524 m)    General: Patient is sitting comfortably in the room  Head: Normocephalic, atraumatic  Cardio: Regular rate and rhythm, no murmurs, rubs or gallops Pulmonary: Clear to ausculation bilaterally with no rales, rhonchi, and crackles  Back: Lumbar midline tenderness, no lumbar paraspinal muscle tenderness, negative straight leg  raise bilaterally    Assessment & Plan:   Essential hypertension Patient has a past medical history of hypertension.  At previous visit, lisinopril was increased to 20 mg daily.  Blood pressure today 134/70.  Well-controlled.  BMP today.  Plan: -Continue lisinopril 20 mg daily -Follow-up BMP  Uncontrolled type 2 diabetes mellitus with hyperglycemia (HCC) Patient has a past medical history of type 2 diabetes mellitus.  Her current regimen includes Tresiba 15 units twice daily and metformin 500 mg daily.  She also uses Humalog 5 units 3 times daily.  Patient per charting has been on [facility], but do not see this on the list, the patient is unsure about this medication.  Patient does not seem to know which medications that she is on, and which ones she is not on.  She has an extensive medication list, which I did try to address, but patient did not bring a medication bag, nor did she know which medication she is taking and which ones she is not taking.  Did recommend her to bring medications at next visit.  A1c today elevated at 8.2. Sugars at home: 130-135. Has not missed any doses of her insulin. She reports that she is drinking a lot and voiding a lot.   Plan: -Increase metformin to 1000 mg twice daily -Continue Tresiba 50 units twice daily -Continue Humalog 5 units 3 times daily -Recheck A1c at next visit in 3 months  Spinal stenosis, lumbar Patient has an extensive history of lumbar  spinal stenosis.  Patient previously had surgery with neurosurgery for bilateral L3-4 decompression laminotomies and foraminotomies.  Patient has not followed up with neurosurgery for this.  She states she had a fall about 3 to 4 days ago causing exacerbation of this pain.  She states that her muscle relaxer is not helping anymore.  She describes this to be a sharp lower back pain that is constant.  She states Tylenol was helping, and is not helping anymore.  She denies any saddle anesthesia, bowel incontinence, or bladder incontinence.  She reports a 10/10 pain in intensity.  On exam, patient has bilateral negative straight leg raise test.  Negative for any lumbar paraspinal tenderness.  Patient does have L3-4 midline spinal tenderness.  With his chronic back pain, patient needs to go to neurosurgery to see if they can offer any other treatments.  Do not think this is related to an infection.  Do not think this is related to a muscle strain.  Did talk to the patient about goals when it comes to pain control and informed her that some days will be good, and some days will be bad.  The goal for pain control is to make the pain tolerable, as it can never go away.  Patient voiced understanding.  Plan: -Start Cymbalta 30 mg daily -Encourage patient to reach out to her neurosurgeon -Refer patient to physical therapy -Lidocaine patches offered -Naproxen offered for days that pain is not tolerable, and encourage patient to not take more than 2 a day and do not take naproxen for more than 2-3 days in a row  Patient discussed with Dr. Letta Kocher, DO PGY-2 Internal Medicine Resident  Pager: [phone]

## 2023-06-08 NOTE — Patient Instructions (Addendum)
Sonda Primes you for allowing me to take part in your care today.  Here are your instructions.  1.  For your back pain, we are going to try a new medicine called Cymbalta.  Please take 30 mg daily.  See if this can help.  2.  I am also referring you to physical therapy. 3.  I am writing you for some naproxen.  This is an anti-inflammatory medication.  Please take this only on bad days.  Do not take more than 2 or 3 days in a row.  4.  I am taking some labs today to check your A1c and kidney function.  I will call you with the results.  5.  Please call your neurosurgeon.  6.  Will follow-up with you in 3 months.  Thank you, Dr. Allena Katz  If you have any other questions please contact the internal medicine clinic at [phone]

## 2023-06-08 NOTE — Assessment & Plan Note (Signed)
Patient has an extensive history of lumbar spinal stenosis.  Patient previously had surgery with neurosurgery for bilateral L3-4 decompression laminotomies and foraminotomies.  Patient has not followed up with neurosurgery for this.  She states she had a fall about 3 to 4 days ago causing exacerbation of this pain.  She states that her muscle relaxer is not helping anymore.  She describes this to be a sharp lower back pain that is constant.  She states Tylenol was helping, and is not helping anymore.  She denies any saddle anesthesia, bowel incontinence, or bladder incontinence.  She reports a 10/10 pain in intensity.  On exam, patient has bilateral negative straight leg raise test.  Negative for any lumbar paraspinal tenderness.  Patient does have L3-4 midline spinal tenderness.  With his chronic back pain, patient needs to go to neurosurgery to see if they can offer any other treatments.  Do not think this is related to an infection.  Do not think this is related to a muscle strain.  Did talk to the patient about goals when it comes to pain control and informed her that some days will be good, and some days will be bad.  The goal for pain control is to make the pain tolerable, as it can never go away.  Patient voiced understanding.  Plan: -Start Cymbalta 30 mg daily -Encourage patient to reach out to her neurosurgeon -Refer patient to physical therapy -Lidocaine patches offered -Naproxen offered for days that pain is not tolerable, and encourage patient to not take more than 2 a day and do not take naproxen for more than 2-3 days in a row

## 2023-06-08 NOTE — Assessment & Plan Note (Signed)
Patient has a past medical history of hypertension.  At previous visit, lisinopril was increased to 20 mg daily.  Blood pressure today 134/70.  Well-controlled.  BMP today.  Plan: -Continue lisinopril 20 mg daily -Follow-up BMP

## 2023-06-09 ENCOUNTER — Other Ambulatory Visit: Payer: Self-pay | Admitting: Pulmonary Disease

## 2023-06-09 DIAGNOSIS — J4540 Moderate persistent asthma, uncomplicated: Secondary | ICD-10-CM

## 2023-06-09 NOTE — Addendum Note (Signed)
Addended by: Modena Slater on: 06/09/2023 09:24 AM   Modules accepted: Level of Service

## 2023-06-09 NOTE — Progress Notes (Signed)
 Internal Medicine Clinic Attending  Case discussed with the resident physician at the time of the visit.  We reviewed the patient's history, exam, and pertinent patient test results.  I agree with the assessment, diagnosis, and plan of care documented in the resident's note.

## 2023-06-09 NOTE — Addendum Note (Signed)
Addended by: Modena Slater on: 06/09/2023 09:18 AM   Modules accepted: Level of Service

## 2023-06-10 LAB — BMP8+ANION GAP
Anion Gap: 17 mmol/L (ref 10.0–18.0)
BUN/Creatinine Ratio: 11 — ABNORMAL LOW (ref 12–28)
BUN: 13 mg/dL (ref 8–27)
CO2: 16 mmol/L — ABNORMAL LOW (ref 20–29)
Calcium: 9.2 mg/dL (ref 8.7–10.3)
Chloride: 104 mmol/L (ref 96–106)
Creatinine, Ser: 1.14 mg/dL — ABNORMAL HIGH (ref 0.57–1.00)
Glucose: 114 mg/dL — ABNORMAL HIGH (ref 70–99)
Potassium: 3.8 mmol/L (ref 3.5–5.2)
Sodium: 137 mmol/L (ref 134–144)
eGFR: 53 mL/min/{1.73_m2} — ABNORMAL LOW (ref >59)

## 2023-06-13 ENCOUNTER — Ambulatory Visit: Payer: Medicare HMO | Admitting: Family

## 2023-06-15 NOTE — Telephone Encounter (Signed)
See routing comment(s) for message information.

## 2023-06-16 NOTE — Progress Notes (Signed)
This encounter was created in error - please disregard.

## 2023-06-19 ENCOUNTER — Ambulatory Visit: Payer: 59 | Admitting: Internal Medicine

## 2023-06-22 ENCOUNTER — Telehealth: Payer: Self-pay

## 2023-06-22 DIAGNOSIS — G4700 Insomnia, unspecified: Secondary | ICD-10-CM

## 2023-06-22 NOTE — Telephone Encounter (Signed)
Pt is requesting a call back .[name] She left a vmail on Doris  machine  that her blood  sugars keep dropping .[name] And she is wanting to know what to do

## 2023-06-22 NOTE — Telephone Encounter (Signed)
RTC to patient now has sugar up to 98.  Has been dropping to 48.  Will forward to doctor and D. Plyler.  Also Trazodone prescription  was not sent to Pharmacy.  Please send new prescription.

## 2023-06-22 NOTE — Telephone Encounter (Signed)
Called patient back regarding her blood sugars.  This morning her 4 a.m. sugars were 43.  At 9 AM it was 55.  In between the 2 checks, patient did not eat anything.  She does report that she felt symptomatic when her sugars were low.  I did tell her that she needs to eat if she gets lows.  Her Evaristo Bury might be too much for her at her current dose of 15 units twice daily.  With suggestion of our registered dietitian, we can do [place] 20 units once a day.  We can measure blood sugars to see how they trend.  I called the patient and let her know this.  She agreed with the plan.  The new plan will be [place] 20 units once a day.  Thank you to our registered dietitian for the suggestion, I think it was a great suggestion.

## 2023-06-22 NOTE — Telephone Encounter (Signed)
I talked to pt who stated she was sitting outside, she felt strange so she checked her BS and it was 55. She drank some coffee and now it's 125. She wants to know why it keeps dropping; and she's afraid it will drop while she's asleep. I did tell her of Donna's response "I suggest decreasing the tresiba insulin to 20-25 units once a day since it has a 42 hour duration."  Her pharmacy is [facility] on [address]. And she would like a call back from the doctor. Thanks

## 2023-06-22 NOTE — Telephone Encounter (Signed)
Pt is calling again about there blood sugar .[name]...it is still dropping and she has not taken her shot .[name] And reports that it is now 5 .[name] Sh did eat today around 12 noon

## 2023-06-22 NOTE — Telephone Encounter (Signed)
I suggest decreasing the tresiba insulin to 20-25 units once a day since it has a 42 hour duration.

## 2023-06-26 ENCOUNTER — Other Ambulatory Visit: Payer: Self-pay | Admitting: Student

## 2023-06-26 DIAGNOSIS — G4700 Insomnia, unspecified: Secondary | ICD-10-CM

## 2023-06-26 MED ORDER — TRAZODONE HCL 50 MG PO TABS
150.0000 mg | ORAL_TABLET | Freq: Every evening | ORAL | Status: DC | PRN
Start: 2023-06-26 — End: 2023-06-28

## 2023-06-26 NOTE — Telephone Encounter (Signed)
  traZODone (DESYREL) 50 MG tablet  [facility] [phone] - , [place] - [address] (Ph: [phone])

## 2023-06-27 ENCOUNTER — Ambulatory Visit: Payer: 59 | Admitting: Podiatry

## 2023-06-28 ENCOUNTER — Other Ambulatory Visit: Payer: Self-pay

## 2023-06-28 DIAGNOSIS — G4700 Insomnia, unspecified: Secondary | ICD-10-CM

## 2023-06-28 MED ORDER — TRAZODONE HCL 50 MG PO TABS
150.0000 mg | ORAL_TABLET | Freq: Every evening | ORAL | 0 refills | Status: DC | PRN
Start: 2023-06-28 — End: 2023-07-31

## 2023-07-03 NOTE — Therapy (Unsigned)
OUTPATIENT PHYSICAL THERAPY THORACOLUMBAR EVALUATION   Patient Name: Kara Kelley MRN: [id number] DOB:09/09/1956, 67 y.o., female Today's Date: 07/05/2023  END OF SESSION:  PT End of Session - 07/04/23 1231     Visit Number 1    Number of Visits 16    Date for PT Re-Evaluation 08/29/23    Authorization Type [employer] Dual Complete, MCD    PT Start Time 1230    PT Stop Time 1315    PT Time Calculation (min) 45 min    Activity Tolerance Patient tolerated treatment well    Behavior During Therapy [facility] for tasks assessed/performed             Past Medical History:  Diagnosis Date   Anemia    Anxiety    Arthritis    Asthma    Bacterial vaginosis 05/16/2022   Bunion    Callus    Candida vaginitis 09/16/2022   Cholelithiasis without obstruction 04/11/2022   Chronic pain    Cocaine abuse (HCC)    COPD (chronic obstructive pulmonary disease) (HCC)    Corns and callosities    Degenerative joint disease    Depression    Diabetes mellitus    Endometrial polyp    ETOH abuse    Gall stones    Gallbladder calculus with acute cholecystitis and no obstruction 04/11/2022   GERD (gastroesophageal reflux disease)    Headache    history of Migraines   Hepatitis C    Hep C   History of adenomatous polyp of colon 04/11/2022   History of endometrial ablation 02/06/2013   Hyperlipidemia    Hypertension    Insomnia    Spondylolisthesis of lumbar region    Substance abuse (HCC)    alcoholism   Tuberculosis 1985   Wears dentures    Wears glasses    Past Surgical History:  Procedure Laterality Date   ANTERIOR CERVICAL DECOMP/DISCECTOMY FUSION N/A 10/26/2021   Procedure: ACDF - C4-C5 - C5-C6 - C6-C7;  Surgeon: Julio Sicks, MD;  Location: [facility] OR;  Service: Neurosurgery;  Laterality: N/A;   BACK SURGERY  2018   CESAREAN SECTION     x3   CHOLECYSTECTOMY N/A 08/11/2016   Procedure: LAPAROSCOPIC CHOLECYSTECTOMY WITH   INTRAOPERATIVE CHOLANGIOGRAM;  Surgeon: Almond Lint, MD;   Location: [facility];  Service: General;  Laterality: N/A;   COLONOSCOPY     Cotton Osteotomy w/ Graft Left 06/18/2009   Excision of Benign Lesion Right 01/30/2013   Rt Plantar   FOOT SURGERY     HAMMER TOE REPAIR Right 02/12/2016   RIGHT #5   Hammertoe Repair Left 06/18/2009   Lt #5   HYSTEROSCOPY N/A 06/20/2017   Procedure: DILATION AND CURETTAGE, HYSTEROSCOPY w/ Polypectomy;  Surgeon: Willodean Rosenthal, MD;  Location: [facility];  Service: Gynecology;  Laterality: N/A;   LUMBAR FUSION  2018   METATARSAL OSTEOTOMY Left 06/18/2009   #5   MULTIPLE TOOTH EXTRACTIONS     Nail Matrixectomy Left 06/18/2009   LT #1   OSTEOTOMY Right 01/30/2013   Rt #5   Phalangectomy Left 06/18/2009   LT #1   Phalangectomy Right 01/30/2013   Rt #1   TUBAL LIGATION     Patient Active Problem List   Diagnosis Date Noted   Colon cancer screening 03/01/2023   Spinal stenosis, lumbar 08/24/2022   Morbid obesity (HCC) 04/11/2022   Polysubstance abuse (HCC) 04/11/2022   Hyperthyroidism 12/29/2021   Cervical spondylosis with myelopathy and radiculopathy 10/26/2021   Hypertensive retinopathy  of both eyes 03/06/2019   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) 03/06/2019   Essential hypertension 10/05/2018   Uncontrolled type 2 diabetes mellitus with hyperglycemia (HCC)    Chronic hepatitis C (HCC) 12/09/2015   Gastroesophageal reflux disease without esophagitis 05/22/2014    UJW:JXBJY, Azucena Cecil DO   REFERRING PROVIDER: Tyson Alias, MD  REFERRING DIAG: [id number] (ICD-10-CM) - Spinal stenosis of lumbar region without neurogenic claudication  Rationale for Evaluation and Treatment: Rehabilitation  THERAPY DIAG:  Muscle weakness (generalized)  Other abnormalities of gait and mobility  Other low back pain  Spinal stenosis of lumbar region with neurogenic claudication  ONSET DATE: chronic   SUBJECTIVE:                                                                                                                                                                                            SUBJECTIVE STATEMENT: "I feel like I am worse since finishing therapy last time (was at Neuro).  The Rt side is weaker.  I have a hard time putting pants and shoes on" Back pain prevents her from bending, standing, walking.  She sometimes uses her walker in the house, has a cane as well.  My dtr says I need it more. Numb in Rt LE sometimes. She has not seen Dr Jordan Likes but has an appt this week. Patient has frequent falls due to Rt LE buckling.   PERTINENT HISTORY:  Patient has an extensive history of lumbar spinal stenosis neurosurgery for bilateral L3-4 decompression laminotomies and foraminotomies.   diabetes  PAIN:  Are you having pain? Yes: NPRS scale: 8/10 Pain location: low back and Rt LE to her shin  Pain description: toothache, nagging  Aggravating factors: walking, standing, bending  Relieving factors: medicine not effectiven, changing positions briefly,has to move a lot   PRECAUTIONS: Fall  RED FLAGS: None   WEIGHT BEARING RESTRICTIONS: No  FALLS:  Has patient fallen in last 6 months? Yes. Number of falls 3, leg giving out.   LIVING ENVIRONMENT: Lives with: lives alone has been staying with her sister during this time of falling and pain  Lives in: House/apartment sister has 4 steps  Stairs: No none in her home  Has following equipment at home: Single point cane and Walker - 4 wheeled  OCCUPATION: on disability   PLOF: Independent with basic ADLs, Independent with household mobility without device, and Independent with community mobility without device  PATIENT GOALS: Pt would like to be able to walk better and help my balance , less pain .   NEXT MD VISIT: upcoming this week   OBJECTIVE:  DIAGNOSTIC FINDINGS:  None recent but has MRI and XR in the past   PATIENT SURVEYS:  Modified Oswestry NT  FOTO 34%    SCREENING FOR RED FLAGS: Bowel or bladder  incontinence: No Spinal tumors: No Cauda equina syndrome: No Compression fracture: No Abdominal aneurysm: No  COGNITION: Overall cognitive status: Within functional limits for tasks assessed     SENSATION: WFL occ. Numbness and tingling in Rt LE at times   MUSCLE LENGTH: Hamstrings: Right WFL with Pain  deg; Left WNL  deg   POSTURE: rounded shoulders, forward head, increased thoracic kyphosis, and hips tight in ER and IR   PALPATION: Tender with gross palpation in lumbar spine and glutes   LUMBAR ROM:   AROM eval  Flexion 50% limited pain   Extension 80% limited pain   Right lateral flexion 50%  Left lateral flexion 50%  Right rotation 50%  Left rotation 50%   (Blank rows = not tested)  LOWER EXTREMITY ROM:   Painful all planes of motion, passively moved each hip to hip flexion (100 deg), about 35 deg ER and 25 deg IR  LOWER EXTREMITY MMT:    MMT Right eval Left eval  Hip flexion 3 4-  Hip extension    Hip abduction 3 3  Hip adduction    Hip internal rotation    Hip external rotation    Knee flexion 3+ 5  Knee extension 4 5  Ankle dorsiflexion 4- 5  Ankle plantarflexion 3+ 3+  Ankle inversion    Ankle eversion     (Blank rows = not tested)  LUMBAR SPECIAL TESTS:  Straight leg raise test: Negative  FUNCTIONAL TESTS:  5 times sit to stand: 18 sec   Unable to do heel raise or walk on heels    Unable to do SLS    Semi tandem hold with need for min A  GAIT: Distance walked: 75 Assistive device utilized: None Level of assistance: Modified independence Comments: slow pace, limp  TODAY'S TREATMENT:                                                                                                                              DATE: 07/04/23 PT eval:  Seated trunk flexion, hamstring, trunk rotation, sit to stand Verbal review of HEP Body mechanics sit to supine     PATIENT EDUCATION:  Education details: PT, POC , HEP Person educated: Patient Education  method: Explanation Education comprehension: verbalized understanding  HOME EXERCISE PROGRAM: Access Code: [email or web address] URL: [email or web address] Date: 07/04/2023 Prepared by: Karie Mainland  Exercises - Supine Lower Trunk Rotation  - 1 x daily - 7 x weekly - 2 sets - 10 reps - 10 hold - Supine Pelvic Tilt  - 1 x daily - 7 x weekly - 2 sets - 10 reps - 5 hold - Seated Hamstring Stretch  - 1 x daily - 7 x weekly - 1 sets - 5 reps - 30  hold - Seated Flexion Stretch  - 1 x daily - 7 x weekly - 1 sets - 5 reps - 30 hold - Sit to Stand Without Arm Support  - 1 x daily - 7 x weekly - 2 sets - 10 reps  ASSESSMENT:  CLINICAL IMPRESSION: Patient presents for PT evaluation for chronic low back pain s/p lumbar fusion.  She cont to hae Rt LE weakness and severe pain.  She will be seeing Dr. Jordan Likes for further recommendations but would like to begin PT at this time.     OBJECTIVE IMPAIRMENTS: Abnormal gait, decreased activity tolerance, decreased balance, decreased mobility, difficulty walking, decreased ROM, decreased strength, increased fascial restrictions, impaired flexibility, improper body mechanics, obesity, and pain.   ACTIVITY LIMITATIONS: carrying, lifting, bending, sitting, standing, squatting, sleeping, stairs, transfers, bed mobility, and locomotion level  PARTICIPATION LIMITATIONS: meal prep, cleaning, shopping, and community activity  PERSONAL FACTORS: Time since onset of injury/illness/exacerbation and 3+ comorbidities: obesity, previous surgery x 2 , diabetes   are also affecting patient's functional outcome.   REHAB POTENTIAL: Fair due to ongoing issues s/p surgeries   CLINICAL DECISION MAKING: Stable/uncomplicated  EVALUATION COMPLEXITY: Low   GOALS: Goals reviewed with patient? Yes  SHORT TERM GOALS: Target date:    Patient will be independent with home program for trunk flexibility and core Baseline: Goal status: INITIAL  2.  Patient will be able to  tolerate supine exercises for therapy sessions Baseline:  Goal status: INITIAL  3.  Patient will complete balance screen and functional testing Baseline:  Goal status: INITIAL  4.  Patient will report mild improvement in baseline lower back pain with functional activities  Baseline:  Goal status: INITIAL   LONG TERM GOALS: Target date: 08/30/2023    Pt will be able to show I with HEP upon discharge  Baseline:  Goal status: INITIAL  2.  Pt will be able to stand for 20 min with pain < 4/10 for ADLs, light home tasks.  Baseline:  Goal status: INITIAL  3.  Pt will be able to walk in the community with LRAD for errands (20-30 min) with pain no more than 5/10  Baseline:  Goal status: INITIAL  4.  Balance TBA  based on initial findings  Baseline:  Goal status: INITIAL  5.  Rt LE strength will improve to 4/5 for maximal support in standing and walking.  Baseline:  Goal status: INITIAL  6.  FOTO will improve to 50% upon discharge  Baseline: 34% Goal status: INITIAL  PLAN:  PT FREQUENCY: 1-2x/week  PT DURATION: 8 weeks  PLANNED INTERVENTIONS: Therapeutic exercises, Therapeutic activity, Neuromuscular re-education, Balance training, Gait training, Patient/Family education, Self Care, Joint mobilization, Aquatic Therapy, Electrical stimulation, Spinal mobilization, Cryotherapy, Moist heat, Manual therapy, and Re-evaluation.  PLAN FOR NEXT SESSION: check HEP, balance assessment    Laqueta Bonaventura, PT 07/05/2023, 7:50 AM   Karie Mainland, PT 07/05/23 11:56 AM Phone: [phone] Fax: [phone]   Date of referral: 06/08/23 Referring provider: Tyson Alias, MD Referring diagnosis? M48.061 (ICD-10-CM) - Spinal stenosis of lumbar region without neurogenic claudication Treatment diagnosis? (if different than referring diagnosis) low back pain, weakness  What was this (referring dx) caused by? Surgery (Type: lumbar fusion ) and Ongoing Issue  Nature of Condition:  Chronic (continuous duration > 3 months)   Laterality: Rt  Current Functional Measure Score: FOTO 34%  Objective measurements identify impairments when they are compared to normal values, the uninvolved extremity, and prior level of function.  [x]  Yes  []   No  Objective assessment of functional ability: Moderate functional limitations   Briefly describe symptoms: Painful low back, Rt LE.  Falls.  Weakness.  Spine stiffness  How did symptoms start: Ongoing since surgeries   Average pain intensity:  Last 24 hours: 8/10-10/10  Past week: 8/10-10/10  How often does the pt experience symptoms? Constantly  How much have the symptoms interfered with usual daily activities? Quite a bit  How has condition changed since care began at this facility? NA - initial visit  In general, how is the patients overall health? Libby Maw, PT 07/05/23 12:01 PM Phone: [phone] Fax: [phone]

## 2023-07-04 ENCOUNTER — Ambulatory Visit: Payer: 59 | Attending: Student in an Organized Health Care Education/Training Program | Admitting: Physical Therapy

## 2023-07-04 DIAGNOSIS — M6281 Muscle weakness (generalized): Secondary | ICD-10-CM | POA: Insufficient documentation

## 2023-07-04 DIAGNOSIS — R2689 Other abnormalities of gait and mobility: Secondary | ICD-10-CM | POA: Diagnosis present

## 2023-07-04 DIAGNOSIS — M5459 Other low back pain: Secondary | ICD-10-CM | POA: Insufficient documentation

## 2023-07-04 DIAGNOSIS — M48061 Spinal stenosis, lumbar region without neurogenic claudication: Secondary | ICD-10-CM | POA: Insufficient documentation

## 2023-07-04 DIAGNOSIS — M48062 Spinal stenosis, lumbar region with neurogenic claudication: Secondary | ICD-10-CM | POA: Insufficient documentation

## 2023-07-09 ENCOUNTER — Other Ambulatory Visit: Payer: Self-pay | Admitting: Family

## 2023-07-09 DIAGNOSIS — E1122 Type 2 diabetes mellitus with diabetic chronic kidney disease: Secondary | ICD-10-CM

## 2023-07-11 ENCOUNTER — Other Ambulatory Visit: Payer: Self-pay | Admitting: Physician Assistant

## 2023-07-11 DIAGNOSIS — K219 Gastro-esophageal reflux disease without esophagitis: Secondary | ICD-10-CM

## 2023-07-13 ENCOUNTER — Telehealth: Payer: Self-pay

## 2023-07-13 NOTE — Telephone Encounter (Signed)
Patient called she is requesting a rx refill for tiZANidine , the medication is not on patients med list anymore. Patient is requesting the rx to be sent to  [id number] - [place], [place] - [address]

## 2023-07-17 NOTE — Telephone Encounter (Signed)
Please advise rx refill 

## 2023-07-18 ENCOUNTER — Other Ambulatory Visit: Payer: Self-pay | Admitting: Student

## 2023-07-18 MED ORDER — TIZANIDINE HCL 2 MG PO TABS: 2.0000 mg | ORAL_TABLET | Freq: Three times a day (TID) | ORAL | 1 refills | Status: DC

## 2023-07-18 NOTE — Telephone Encounter (Signed)
I called the patient and spoke with her today about her refills for tizanidine.  I sent a new prescription for tizanidine 2 mg 3 times daily for 2 months until her next office visit in December.  I canceled the Flexeril medication.  I informed the patient about sedating effects of the tizanidine.  I informed the patient of the goal is to slowly wean off of the tizanidine and increase Cymbalta as appropriate with physical therapy.

## 2023-07-18 NOTE — Therapy (Unsigned)
OUTPATIENT PHYSICAL THERAPY THORACOLUMBAR EVALUATION   Patient Name: Kara Kelley MRN: [id number] DOB:08-04-1956, 67 y.o., female Today's Date: 07/19/2023  END OF SESSION:  PT End of Session - 07/19/23 1106     Visit Number 2    Number of Visits 16    Date for PT Re-Evaluation 08/29/23    Authorization Type U[employer] Dual Complete, MCD    PT Start Time 1104    PT Stop Time 1150    PT Time Calculation (min) 46 min    Activity Tolerance Patient tolerated treatment well;Patient limited by pain    Behavior During Therapy [facility] for tasks assessed/performed              Past Medical History:  Diagnosis Date   Anemia    Anxiety    Arthritis    Asthma    Bacterial vaginosis 05/16/2022   Bunion    Callus    Candida vaginitis 09/16/2022   Cholelithiasis without obstruction 04/11/2022   Chronic pain    Cocaine abuse (HCC)    COPD (chronic obstructive pulmonary disease) (HCC)    Corns and callosities    Degenerative joint disease    Depression    Diabetes mellitus    Endometrial polyp    ETOH abuse    Gall stones    Gallbladder calculus with acute cholecystitis and no obstruction 04/11/2022   GERD (gastroesophageal reflux disease)    Headache    history of Migraines   Hepatitis C    Hep C   History of adenomatous polyp of colon 04/11/2022   History of endometrial ablation 02/06/2013   Hyperlipidemia    Hypertension    Insomnia    Spondylolisthesis of lumbar region    Substance abuse (HCC)    alcoholism   Tuberculosis 1985   Wears dentures    Wears glasses    Past Surgical History:  Procedure Laterality Date   ANTERIOR CERVICAL DECOMP/DISCECTOMY FUSION N/A 10/26/2021   Procedure: ACDF - C4-C5 - C5-C6 - C6-C7;  Surgeon: Julio Sicks, MD;  Location: [facility] OR;  Service: Neurosurgery;  Laterality: N/A;   BACK SURGERY  2018   CESAREAN SECTION     x3   CHOLECYSTECTOMY N/A 08/11/2016   Procedure: LAPAROSCOPIC CHOLECYSTECTOMY WITH   INTRAOPERATIVE CHOLANGIOGRAM;  Surgeon:  Almond Lint, MD;  Location: [facility];  Service: General;  Laterality: N/A;   COLONOSCOPY     Cotton Osteotomy w/ Graft Left 06/18/2009   Excision of Benign Lesion Right 01/30/2013   Rt Plantar   FOOT SURGERY     HAMMER TOE REPAIR Right 02/12/2016   RIGHT #5   Hammertoe Repair Left 06/18/2009   Lt #5   HYSTEROSCOPY N/A 06/20/2017   Procedure: DILATION AND CURETTAGE, HYSTEROSCOPY w/ Polypectomy;  Surgeon: Willodean Rosenthal, MD;  Location: [facility];  Service: Gynecology;  Laterality: N/A;   LUMBAR FUSION  2018   METATARSAL OSTEOTOMY Left 06/18/2009   #5   MULTIPLE TOOTH EXTRACTIONS     Nail Matrixectomy Left 06/18/2009   LT #1   OSTEOTOMY Right 01/30/2013   Rt #5   Phalangectomy Left 06/18/2009   LT #1   Phalangectomy Right 01/30/2013   Rt #1   TUBAL LIGATION     Patient Active Problem List   Diagnosis Date Noted   Colon cancer screening 03/01/2023   Spinal stenosis, lumbar 08/24/2022   Morbid obesity (HCC) 04/11/2022   Polysubstance abuse (HCC) 04/11/2022   Hyperthyroidism 12/29/2021   Cervical spondylosis with myelopathy and radiculopathy 10/26/2021  Hypertensive retinopathy of both eyes 03/06/2019   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) 03/06/2019   Essential hypertension 10/05/2018   Uncontrolled type 2 diabetes mellitus with hyperglycemia (HCC)    Chronic hepatitis C (HCC) 12/09/2015   Gastroesophageal reflux disease without esophagitis 05/22/2014    [id number], Azucena Cecil DO   REFERRING PROVIDER: Tyson Alias, MD  REFERRING DIAG: [phone] (ICD-10-CM) - Spinal stenosis of lumbar region without neurogenic claudication  Rationale for Evaluation and Treatment: Rehabilitation  THERAPY DIAG:  Muscle weakness (generalized)  Other abnormalities of gait and mobility  Other low back pain  Spinal stenosis of lumbar region with neurogenic claudication  ONSET DATE: chronic   SUBJECTIVE:                                                                                                                                                                                            SUBJECTIVE STATEMENT: Patient reports a different kind of pain since about Friday. She feels like her leg locks up and not sure if it is a spasm.  She reports this as a new symptom but it sounds like a familiar pain as she goes into detail.     PERTINENT HISTORY:  Patient has an extensive history of lumbar spinal stenosis neurosurgery for bilateral L3-4 decompression laminotomies and foraminotomies.   diabetes  PAIN:  Are you having pain? Yes: NPRS scale: 7/10 Pain location: low back and Rt LE to her shin  Pain description: toothache, nagging  Aggravating factors: walking, standing, bending  Relieving factors: medicine not effectiven, changing positions briefly,has to move a lot   PRECAUTIONS: Fall  RED FLAGS: None   WEIGHT BEARING RESTRICTIONS: No  FALLS:  Has patient fallen in last 6 months? Yes. Number of falls 3, leg giving out.   LIVING ENVIRONMENT: Lives with: lives alone has been staying with her sister during this time of falling and pain  Lives in: House/apartment sister has 4 steps  Stairs: No none in her home  Has following equipment at home: Single point cane and Walker - 4 wheeled  OCCUPATION: on disability   PLOF: Independent with basic ADLs, Independent with household mobility without device, and Independent with community mobility without device  PATIENT GOALS: Pt would like to be able to walk better and help my balance , less pain .   NEXT MD VISIT: upcoming this week   OBJECTIVE:   DIAGNOSTIC FINDINGS:  None recent but has MRI and XR in the past   PATIENT SURVEYS:  Modified Oswestry NT  FOTO 34%    SCREENING FOR RED FLAGS: Bowel or bladder incontinence: No Spinal  tumors: No Cauda equina syndrome: No Compression fracture: No Abdominal aneurysm: No  COGNITION: Overall cognitive status: Within functional limits for tasks  assessed     SENSATION: WFL occ. Numbness and tingling in Rt LE at times   MUSCLE LENGTH: Hamstrings: Right WFL with Pain  deg; Left WNL  deg   POSTURE: rounded shoulders, forward head, increased thoracic kyphosis, and hips tight in ER and IR   PALPATION: Tender with gross palpation in lumbar spine and glutes   LUMBAR ROM:   AROM eval  Flexion 50% limited pain   Extension 80% limited pain   Right lateral flexion 50%  Left lateral flexion 50%  Right rotation 50%  Left rotation 50%   (Blank rows = not tested)  LOWER EXTREMITY ROM:   Painful all planes of motion, passively moved each hip to hip flexion (100 deg), about 35 deg ER and 25 deg IR  LOWER EXTREMITY MMT:    MMT Right eval Left eval  Hip flexion 3 4-  Hip extension    Hip abduction 3 3  Hip adduction    Hip internal rotation    Hip external rotation    Knee flexion 3+ 5  Knee extension 4 5  Ankle dorsiflexion 4- 5  Ankle plantarflexion 3+ 3+  Ankle inversion    Ankle eversion     (Blank rows = not tested)  LUMBAR SPECIAL TESTS:  Straight leg raise test: Negative  FUNCTIONAL TESTS:  5 times sit to stand: 18 sec   Unable to do heel raise or walk on heels    Unable to do SLS    Semi tandem hold with need for min A  GAIT: Distance walked: 75 Assistive device utilized: None Level of assistance: Modified independence Comments: slow pace, limp  TODAY'S TREATMENT:          [facility] Adult PT Treatment:                                                DATE: 07/19/23 Therapeutic Exercise: Supine Lower Trunk Rotation  x 10  Supine Pelvic Tilt - unable despite cues  Supine knee to chest with towel 20 sec x 3  Seated Hamstring Stretch /nerve flossing  Standing L stretch/ Flexion Stretch  NuStep L4 UE and LE  Modalities: IFC to tolerance (10/5 v) seated with MHP 15 min                                                                                                                  DATE: 07/04/23 PT eval:   Seated trunk flexion, hamstring, trunk rotation, sit to stand Verbal review of HEP Body mechanics sit to supine     PATIENT EDUCATION:  Education details: PT, POC , HEP Person educated: Patient Education method: Explanation Education comprehension: verbalized understanding  HOME EXERCISE PROGRAM: Access Code: [email or web address] URL: [email or web address] Date: 07/04/2023 Prepared  by: Karie Mainland  Exercises - Supine Lower Trunk Rotation  - 1 x daily - 7 x weekly - 2 sets - 10 reps - 10 hold - Supine Pelvic Tilt  - 1 x daily - 7 x weekly - 2 sets - 10 reps - 5 hold - Seated Hamstring Stretch  - 1 x daily - 7 x weekly - 1 sets - 5 reps - 30 hold - Seated Flexion Stretch  - 1 x daily - 7 x weekly - 1 sets - 5 reps - 30 hold - Sit to Stand Without Arm Support  - 1 x daily - 7 x weekly - 2 sets - 10 reps  ASSESSMENT:  CLINICAL IMPRESSION: Patient did not see her doctor.  She has not been doing her HEP given on evaluation. Pt was encouraged to do a little bit of gentle motion to get circulation to her tissues and joints. If Offered IFC as a trial for a TENS unit to manage her chronic pain. Will cont to progress as able now that she has more consistent appts made.    OBJECTIVE IMPAIRMENTS: Abnormal gait, decreased activity tolerance, decreased balance, decreased mobility, difficulty walking, decreased ROM, decreased strength, increased fascial restrictions, impaired flexibility, improper body mechanics, obesity, and pain.   ACTIVITY LIMITATIONS: carrying, lifting, bending, sitting, standing, squatting, sleeping, stairs, transfers, bed mobility, and locomotion level  PARTICIPATION LIMITATIONS: meal prep, cleaning, shopping, and community activity  PERSONAL FACTORS: Time since onset of injury/illness/exacerbation and 3+ comorbidities: obesity, previous surgery x 2 , diabetes   are also affecting patient's functional outcome.   REHAB POTENTIAL: Fair due to ongoing issues s/p  surgeries   CLINICAL DECISION MAKING: Stable/uncomplicated  EVALUATION COMPLEXITY: Low   GOALS: Goals reviewed with patient? Yes  SHORT TERM GOALS: Target date:    Patient will be independent with home program for trunk flexibility and core Baseline: Goal status: INITIAL  2.  Patient will be able to tolerate supine exercises for therapy sessions Baseline:  Goal status: INITIAL  3.  Patient will complete balance screen and functional testing Baseline:  Goal status: INITIAL  4.  Patient will report mild improvement in baseline lower back pain with functional activities  Baseline:  Goal status: INITIAL   LONG TERM GOALS: Target date: 08/30/2023    Pt will be able to show I with HEP upon discharge  Baseline:  Goal status: INITIAL  2.  Pt will be able to stand for 20 min with pain < 4/10 for ADLs, light home tasks.  Baseline:  Goal status: INITIAL  3.  Pt will be able to walk in the community with LRAD for errands (20-30 min) with pain no more than 5/10  Baseline:  Goal status: INITIAL  4.  Balance TBA  based on initial findings  Baseline:  Goal status: INITIAL  5.  Rt LE strength will improve to 4/5 for maximal support in standing and walking.  Baseline:  Goal status: INITIAL  6.  FOTO will improve to 50% upon discharge  Baseline: 34% Goal status: INITIAL  PLAN:  PT FREQUENCY: 1-2x/week  PT DURATION: 8 weeks  PLANNED INTERVENTIONS: Therapeutic exercises, Therapeutic activity, Neuromuscular re-education, Balance training, Gait training, Patient/Family education, Self Care, Joint mobilization, Aquatic Therapy, Electrical stimulation, Spinal mobilization, Cryotherapy, Moist heat, Manual therapy, and Re-evaluation.  PLAN FOR NEXT SESSION: check HEP, balance assessment . Did she like IFC? Dtr has TENS unit    Javarious Elsayed, PT 07/19/2023, 11:40 AM   Karie Mainland, PT 07/19/23 11:40 AM  Phone: [phone] Fax: [phone]    Karie Mainland, PT 07/19/23  11:40 AM Phone: [phone] Fax: [phone]

## 2023-07-19 ENCOUNTER — Ambulatory Visit: Payer: 59 | Attending: Student in an Organized Health Care Education/Training Program | Admitting: Physical Therapy

## 2023-07-19 ENCOUNTER — Encounter: Payer: Self-pay | Admitting: Physical Therapy

## 2023-07-19 DIAGNOSIS — R2689 Other abnormalities of gait and mobility: Secondary | ICD-10-CM | POA: Insufficient documentation

## 2023-07-19 DIAGNOSIS — M48062 Spinal stenosis, lumbar region with neurogenic claudication: Secondary | ICD-10-CM | POA: Diagnosis present

## 2023-07-19 DIAGNOSIS — M6281 Muscle weakness (generalized): Secondary | ICD-10-CM | POA: Diagnosis present

## 2023-07-19 DIAGNOSIS — M5459 Other low back pain: Secondary | ICD-10-CM | POA: Insufficient documentation

## 2023-07-20 ENCOUNTER — Other Ambulatory Visit: Payer: Self-pay | Admitting: Student

## 2023-07-20 DIAGNOSIS — N1832 Chronic kidney disease, stage 3b: Secondary | ICD-10-CM

## 2023-07-20 NOTE — Therapy (Signed)
OUTPATIENT PHYSICAL THERAPY NOTE    Patient Name: Kara Kelley MRN: [id number] DOB:[birth year], 67 y.o., female Today's Date: 07/21/2023  END OF SESSION:  PT End of Session - 07/21/23 1116     Visit Number 3    Number of Visits 16    Date for PT Re-Evaluation 08/29/23    Authorization Type UHC Dual Complete, MCD    PT Start Time 1109    PT Stop Time 1200    PT Time Calculation (min) 51 min    Activity Tolerance Patient tolerated treatment well;Patient limited by pain    Behavior During Therapy [facility] for tasks assessed/performed               Past Medical History:  Diagnosis Date   Anemia    Anxiety    Arthritis    Asthma    Bacterial vaginosis 05/16/2022   Bunion    Callus    Candida vaginitis 09/16/2022   Cholelithiasis without obstruction 04/11/2022   Chronic pain    Cocaine abuse (HCC)    COPD (chronic obstructive pulmonary disease) (HCC)    Corns and callosities    Degenerative joint disease    Depression    Diabetes mellitus    Endometrial polyp    ETOH abuse    Gall stones    Gallbladder calculus with acute cholecystitis and no obstruction 04/11/2022   GERD (gastroesophageal reflux disease)    Headache    history of Migraines   Hepatitis C    Hep C   History of adenomatous polyp of colon 04/11/2022   History of endometrial ablation 02/06/2013   Hyperlipidemia    Hypertension    Insomnia    Spondylolisthesis of lumbar region    Substance abuse (HCC)    alcoholism   Tuberculosis 1985   Wears dentures    Wears glasses    Past Surgical History:  Procedure Laterality Date   ANTERIOR CERVICAL DECOMP/DISCECTOMY FUSION N/A 10/26/2021   Procedure: ACDF - C4-C5 - C5-C6 - C6-C7;  Surgeon: Julio Sicks, MD;  Location: [facility] OR;  Service: Neurosurgery;  Laterality: N/A;   BACK SURGERY  2018   CESAREAN SECTION     x3   CHOLECYSTECTOMY N/A 08/11/2016   Procedure: LAPAROSCOPIC CHOLECYSTECTOMY WITH   INTRAOPERATIVE CHOLANGIOGRAM;  Surgeon: Almond Lint, MD;   Location: [facility];  Service: General;  Laterality: N/A;   COLONOSCOPY     Cotton Osteotomy w/ Graft Left 06/18/2009   Excision of Benign Lesion Right 01/30/2013   Rt Plantar   FOOT SURGERY     HAMMER TOE REPAIR Right 02/12/2016   RIGHT #5   Hammertoe Repair Left 06/18/2009   Lt #5   HYSTEROSCOPY N/A 06/20/2017   Procedure: DILATION AND CURETTAGE, HYSTEROSCOPY w/ Polypectomy;  Surgeon: Willodean Rosenthal, MD;  Location: [facility];  Service: Gynecology;  Laterality: N/A;   LUMBAR FUSION  2018   METATARSAL OSTEOTOMY Left 06/18/2009   #5   MULTIPLE TOOTH EXTRACTIONS     Nail Matrixectomy Left 06/18/2009   LT #1   OSTEOTOMY Right 01/30/2013   Rt #5   Phalangectomy Left 06/18/2009   LT #1   Phalangectomy Right 01/30/2013   Rt #1   TUBAL LIGATION     Patient Active Problem List   Diagnosis Date Noted   Colon cancer screening 03/01/2023   Spinal stenosis, lumbar 08/24/2022   Morbid obesity (HCC) 04/11/2022   Polysubstance abuse (HCC) 04/11/2022   Hyperthyroidism 12/29/2021   Cervical spondylosis with myelopathy and radiculopathy  10/26/2021   Hypertensive retinopathy of both eyes 03/06/2019   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) 03/06/2019   Essential hypertension 10/05/2018   Uncontrolled type 2 diabetes mellitus with hyperglycemia (HCC)    Chronic hepatitis C (HCC) 12/09/2015   Gastroesophageal reflux disease without esophagitis 05/22/2014    [id number], Azucena Cecil DO   REFERRING PROVIDER: Tyson Alias, MD  REFERRING DIAG: [phone] (ICD-10-CM) - Spinal stenosis of lumbar region without neurogenic claudication  Rationale for Evaluation and Treatment: Rehabilitation  THERAPY DIAG:  Muscle weakness (generalized)  Other abnormalities of gait and mobility  Other low back pain  Spinal stenosis of lumbar region with neurogenic claudication  ONSET DATE: chronic   SUBJECTIVE:                                                                                                                                                                                            SUBJECTIVE STATEMENT: It was kind of sore after last time.  Back pain is 8/10, less leg pain today. Leg spasms yesterday. Her daughter can't find the TENs unit. Call ed the doctor for an earlier appt.     PERTINENT HISTORY:  Patient has an extensive history of lumbar spinal stenosis neurosurgery for bilateral L3-4 decompression laminotomies and foraminotomies.   diabetes  PAIN:  Are you having pain? Yes: NPRS scale: 8/10 Pain location: low back and Rt LE to her shin  Pain description: toothache, nagging  Aggravating factors: walking, standing, bending  Relieving factors: medicine not effectiven, changing positions briefly,has to move a lot   PRECAUTIONS: Fall  RED FLAGS: None   WEIGHT BEARING RESTRICTIONS: No  FALLS:  Has patient fallen in last 6 months? Yes. Number of falls 3, leg giving out.   LIVING ENVIRONMENT: Lives with: lives alone has been staying with her sister during this time of falling and pain  Lives in: House/apartment sister has 4 steps  Stairs: No none in her home  Has following equipment at home: Single point cane and Walker - 4 wheeled  OCCUPATION: on disability   PLOF: Independent with basic ADLs, Independent with household mobility without device, and Independent with community mobility without device  PATIENT GOALS: Pt would like to be able to walk better and help my balance , less pain .   NEXT MD VISIT: upcoming this week   OBJECTIVE:   DIAGNOSTIC FINDINGS:  None recent but has MRI and XR in the past   PATIENT SURVEYS:  Modified Oswestry NT  FOTO 34%    SCREENING FOR RED FLAGS: Bowel or bladder incontinence: No Spinal tumors: No Cauda equina syndrome: No Compression  fracture: No Abdominal aneurysm: No  COGNITION: Overall cognitive status: Within functional limits for tasks assessed     SENSATION: WFL occ. Numbness and tingling  in Rt LE at times   MUSCLE LENGTH: Hamstrings: Right WFL with Pain  deg; Left WNL  deg   POSTURE: rounded shoulders, forward head, increased thoracic kyphosis, and hips tight in ER and IR   PALPATION: Tender with gross palpation in lumbar spine and glutes   LUMBAR ROM:   AROM eval  Flexion 50% limited pain   Extension 80% limited pain   Right lateral flexion 50%  Left lateral flexion 50%  Right rotation 50%  Left rotation 50%   (Blank rows = not tested)  LOWER EXTREMITY ROM:   Painful all planes of motion, passively moved each hip to hip flexion (100 deg), about 35 deg ER and 25 deg IR  LOWER EXTREMITY MMT:    MMT Right eval Left eval  Hip flexion 3 4-  Hip extension    Hip abduction 3 3  Hip adduction    Hip internal rotation    Hip external rotation    Knee flexion 3+ 5  Knee extension 4 5  Ankle dorsiflexion 4- 5  Ankle plantarflexion 3+ 3+  Ankle inversion    Ankle eversion     (Blank rows = not tested)  LUMBAR SPECIAL TESTS:  Straight leg raise test: Negative  FUNCTIONAL TESTS:  5 times sit to stand: 18 sec   Unable to do heel raise or walk on heels    Unable to do SLS    Semi tandem hold with need for min A  GAIT: Distance walked: 75 Assistive device utilized: None Level of assistance: Modified independence Comments: slow pace, limp  TODAY'S TREATMENT:         [facility] Adult PT Treatment:                                                DATE: 07/21/23 Therapeutic Exercise: NuStep L5 UE and LE for 5 min  Sidelying open book  x 10 , cues for technique  Hip abduction x 10, very challenging  Clam x 10 x 2 sets  Knee to chest added knee extension used towel under thigh Standing core press with ball  x 10  L stretch at ball  Standing row and extension x 15 green band  Modalities: IFC with MHP 15 min seated to tolerance    [facility] Adult PT Treatment:                                                DATE: 07/19/23 Therapeutic Exercise: Supine Lower Trunk  Rotation  x 10  Supine Pelvic Tilt - unable despite cues  Supine knee to chest with towel 20 sec x 3  Seated Hamstring Stretch /nerve flossing  Standing L stretch/ Flexion Stretch  NuStep L4 UE and LE  Modalities: IFC to tolerance (10/5 v) seated with MHP 15 min  DATE: 07/04/23 PT eval:  Seated trunk flexion, hamstring, trunk rotation, sit to stand Verbal review of HEP Body mechanics sit to supine     PATIENT EDUCATION:  Education details: PT, POC , HEP Person educated: Patient Education method: Explanation Education comprehension: verbalized understanding  HOME EXERCISE PROGRAM: Access Code: [email or web address] URL: [email or web address] Date: 07/04/2023 Prepared by: Karie Mainland  Exercises - Supine Lower Trunk Rotation  - 1 x daily - 7 x weekly - 2 sets - 10 reps - 10 hold - Supine Pelvic Tilt  - 1 x daily - 7 x weekly - 2 sets - 10 reps - 5 hold - Seated Hamstring Stretch  - 1 x daily - 7 x weekly - 1 sets - 5 reps - 30 hold - Seated Flexion Stretch  - 1 x daily - 7 x weekly - 1 sets - 5 reps - 30 hold - Sit to Stand Without Arm Support  - 1 x daily - 7 x weekly - 2 sets - 10 reps  ASSESSMENT:  CLINICAL IMPRESSION: Patient will see her MD and ask for a referral for a TENS unit.  She needs increased time to transition between exercises due to pain . She was in too much pain for balance assessment, opted to manage symptoms today.  Patient reports continued pain after session not rated. she will cont to benefit from skilled PT in order to improve functional mobility and activity tolerance.     OBJECTIVE IMPAIRMENTS: Abnormal gait, decreased activity tolerance, decreased balance, decreased mobility, difficulty walking, decreased ROM, decreased strength, increased fascial restrictions, impaired flexibility, improper body mechanics, obesity, and pain.   ACTIVITY  LIMITATIONS: carrying, lifting, bending, sitting, standing, squatting, sleeping, stairs, transfers, bed mobility, and locomotion level  PARTICIPATION LIMITATIONS: meal prep, cleaning, shopping, and community activity  PERSONAL FACTORS: Time since onset of injury/illness/exacerbation and 3+ comorbidities: obesity, previous surgery x 2 , diabetes   are also affecting patient's functional outcome.   REHAB POTENTIAL: Fair due to ongoing issues s/p surgeries   CLINICAL DECISION MAKING: Stable/uncomplicated  EVALUATION COMPLEXITY: Low   GOALS: Goals reviewed with patient? Yes  SHORT TERM GOALS: Target date:    Patient will be independent with home program for trunk flexibility and core Baseline: Goal status: ongoing   2.  Patient will be able to tolerate supine exercises for therapy sessions Baseline:  Goal status: ongoing   3.  Patient will complete balance screen and functional testing Baseline:  Goal status: ongoing   4.  Patient will report mild improvement in baseline lower back pain with functional activities  Baseline:  Goal status:ongoing   LONG TERM GOALS: Target date: 08/30/2023    Pt will be able to show I with HEP upon discharge  Baseline:  Goal status: INITIAL  2.  Pt will be able to stand for 20 min with pain < 4/10 for ADLs, light home tasks.  Baseline:  Goal status: INITIAL  3.  Pt will be able to walk in the community with LRAD for errands (20-30 min) with pain no more than 5/10  Baseline:  Goal status: INITIAL  4.  Balance TBA  based on initial findings  Baseline:  Goal status: INITIAL  5.  Rt LE strength will improve to 4/5 for maximal support in standing and walking.  Baseline:  Goal status: INITIAL  6.  FOTO will improve to 50% upon discharge  Baseline: 34% Goal status: INITIAL  PLAN:  PT FREQUENCY: 1-2x/week  PT DURATION: 8 weeks  PLANNED INTERVENTIONS:  Therapeutic exercises, Therapeutic activity, Neuromuscular re-education, Balance  training, Gait training, Patient/Family education, Self Care, Joint mobilization, Aquatic Therapy, Electrical stimulation, Spinal mobilization, Cryotherapy, Moist heat, Manual therapy, and Re-evaluation.  PLAN FOR NEXT SESSION: check HEP, balance assessment . Modalities as needed   Karie Mainland, PT 07/21/23 12:35 PM Phone: [phone] Fax: [phone]   Karie Mainland, PT 07/21/23 12:35 PM Phone: [phone] Fax: [phone]

## 2023-07-21 ENCOUNTER — Encounter: Payer: Self-pay | Admitting: Physical Therapy

## 2023-07-21 ENCOUNTER — Ambulatory Visit: Payer: 59 | Admitting: Physical Therapy

## 2023-07-21 DIAGNOSIS — M6281 Muscle weakness (generalized): Secondary | ICD-10-CM

## 2023-07-21 DIAGNOSIS — R2689 Other abnormalities of gait and mobility: Secondary | ICD-10-CM

## 2023-07-21 DIAGNOSIS — M5459 Other low back pain: Secondary | ICD-10-CM

## 2023-07-21 DIAGNOSIS — M48062 Spinal stenosis, lumbar region with neurogenic claudication: Secondary | ICD-10-CM

## 2023-07-21 NOTE — Telephone Encounter (Signed)
Next appt scheduled 12/2 with Dr Ned Card.

## 2023-07-25 ENCOUNTER — Ambulatory Visit: Payer: 59 | Admitting: Physical Therapy

## 2023-07-25 ENCOUNTER — Other Ambulatory Visit: Payer: Self-pay | Admitting: Student

## 2023-07-25 ENCOUNTER — Encounter: Payer: Self-pay | Admitting: Physical Therapy

## 2023-07-25 DIAGNOSIS — M48062 Spinal stenosis, lumbar region with neurogenic claudication: Secondary | ICD-10-CM

## 2023-07-25 DIAGNOSIS — G4700 Insomnia, unspecified: Secondary | ICD-10-CM

## 2023-07-25 DIAGNOSIS — R2689 Other abnormalities of gait and mobility: Secondary | ICD-10-CM

## 2023-07-25 DIAGNOSIS — M6281 Muscle weakness (generalized): Secondary | ICD-10-CM

## 2023-07-25 DIAGNOSIS — M5459 Other low back pain: Secondary | ICD-10-CM

## 2023-07-25 NOTE — Therapy (Signed)
OUTPATIENT PHYSICAL THERAPY NOTE    Patient Name: Kara Kelley MRN: [id number] DOB:1956/08/12, 67 y.o., female Today's Date: 07/25/2023  END OF SESSION:  PT End of Session - 07/25/23 1150     Visit Number 4    Number of Visits 16    Date for PT Re-Evaluation 08/29/23    Authorization Type [employer] Dual Complete, MCD    PT Start Time 1148    PT Stop Time 1245    PT Time Calculation (min) 57 min    Activity Tolerance Patient tolerated treatment well;Patient limited by pain    Behavior During Therapy [facility] for tasks assessed/performed                Past Medical History:  Diagnosis Date   Anemia    Anxiety    Arthritis    Asthma    Bacterial vaginosis 05/16/2022   Bunion    Callus    Candida vaginitis 09/16/2022   Cholelithiasis without obstruction 04/11/2022   Chronic pain    Cocaine abuse (HCC)    COPD (chronic obstructive pulmonary disease) (HCC)    Corns and callosities    Degenerative joint disease    Depression    Diabetes mellitus    Endometrial polyp    ETOH abuse    Gall stones    Gallbladder calculus with acute cholecystitis and no obstruction 04/11/2022   GERD (gastroesophageal reflux disease)    Headache    history of Migraines   Hepatitis C    Hep C   History of adenomatous polyp of colon 04/11/2022   History of endometrial ablation 02/06/2013   Hyperlipidemia    Hypertension    Insomnia    Spondylolisthesis of lumbar region    Substance abuse (HCC)    alcoholism   Tuberculosis 1985   Wears dentures    Wears glasses    Past Surgical History:  Procedure Laterality Date   ANTERIOR CERVICAL DECOMP/DISCECTOMY FUSION N/A 10/26/2021   Procedure: ACDF - C4-C5 - C5-C6 - C6-C7;  Surgeon: Julio Sicks, MD;  Location: [facility] OR;  Service: Neurosurgery;  Laterality: N/A;   BACK SURGERY  2018   CESAREAN SECTION     x3   CHOLECYSTECTOMY N/A 08/11/2016   Procedure: LAPAROSCOPIC CHOLECYSTECTOMY WITH   INTRAOPERATIVE CHOLANGIOGRAM;  Surgeon: Almond Lint,  MD;  Location: [facility] ORS;  Service: General;  Laterality: N/A;   COLONOSCOPY     Cotton Osteotomy w/ Graft Left 06/18/2009   Excision of Benign Lesion Right 01/30/2013   Rt Plantar   FOOT SURGERY     HAMMER TOE REPAIR Right 02/12/2016   RIGHT #5   Hammertoe Repair Left 06/18/2009   Lt #5   HYSTEROSCOPY N/A 06/20/2017   Procedure: DILATION AND CURETTAGE, HYSTEROSCOPY w/ Polypectomy;  Surgeon: Willodean Rosenthal, MD;  Location: [facility] ORS;  Service: Gynecology;  Laterality: N/A;   LUMBAR FUSION  2018   METATARSAL OSTEOTOMY Left 06/18/2009   #5   MULTIPLE TOOTH EXTRACTIONS     Nail Matrixectomy Left 06/18/2009   LT #1   OSTEOTOMY Right 01/30/2013   Rt #5   Phalangectomy Left 06/18/2009   LT #1   Phalangectomy Right 01/30/2013   Rt #1   TUBAL LIGATION     Patient Active Problem List   Diagnosis Date Noted   Colon cancer screening 03/01/2023   Spinal stenosis, lumbar 08/24/2022   Morbid obesity (HCC) 04/11/2022   Polysubstance abuse (HCC) 04/11/2022   Hyperthyroidism 12/29/2021   Cervical spondylosis with myelopathy and  radiculopathy 10/26/2021   Hypertensive retinopathy of both eyes 03/06/2019   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) 03/06/2019   Essential hypertension 10/05/2018   Uncontrolled type 2 diabetes mellitus with hyperglycemia (HCC)    Chronic hepatitis C (HCC) 12/09/2015   Gastroesophageal reflux disease without esophagitis 05/22/2014    ZOX:WRUEA, Azucena Cecil DO   REFERRING PROVIDER: Tyson Alias, MD  REFERRING DIAG: [phone] (ICD-10-CM) - Spinal stenosis of lumbar region without neurogenic claudication  Rationale for Evaluation and Treatment: Rehabilitation  THERAPY DIAG:  Muscle weakness (generalized)  Other abnormalities of gait and mobility  Other low back pain  Spinal stenosis of lumbar region with neurogenic claudication  ONSET DATE: chronic   SUBJECTIVE:                                                                                                                                                                                            SUBJECTIVE STATEMENT: Leg pain is improving.  Back is hurting, the same.    PERTINENT HISTORY:  Patient has an extensive history of lumbar spinal stenosis neurosurgery for bilateral L3-4 decompression laminotomies and foraminotomies.   diabetes  PAIN:  Are you having pain? Yes: NPRS scale: 8/10 Pain location: low back and Rt LE to her shin  Pain description: toothache, nagging  Aggravating factors: walking, standing, bending  Relieving factors: medicine not effectiven, changing positions briefly,has to move a lot   PRECAUTIONS: Fall  RED FLAGS: None   WEIGHT BEARING RESTRICTIONS: No  FALLS:  Has patient fallen in last 6 months? Yes. Number of falls 3, leg giving out.   LIVING ENVIRONMENT: Lives with: lives alone has been staying with her sister during this time of falling and pain  Lives in: House/apartment sister has 4 steps  Stairs: No none in her home  Has following equipment at home: Single point cane and Walker - 4 wheeled  OCCUPATION: on disability   PLOF: Independent with basic ADLs, Independent with household mobility without device, and Independent with community mobility without device  PATIENT GOALS: Pt would like to be able to walk better and help my balance , less pain .   NEXT MD VISIT: upcoming this week   OBJECTIVE:   DIAGNOSTIC FINDINGS:  None recent but has MRI and XR in the past   PATIENT SURVEYS:  Modified Oswestry NT  FOTO 34%    SCREENING FOR RED FLAGS: Bowel or bladder incontinence: No Spinal tumors: No Cauda equina syndrome: No Compression fracture: No Abdominal aneurysm: No  COGNITION: Overall cognitive status: Within functional limits for tasks assessed     SENSATION: WFL occ. Numbness and  tingling in Rt LE at times   MUSCLE LENGTH: Hamstrings: Right WFL with Pain  deg; Left WNL  deg   POSTURE: rounded shoulders,  forward head, increased thoracic kyphosis, and hips tight in ER and IR   PALPATION: Tender with gross palpation in lumbar spine and glutes   LUMBAR ROM:   AROM eval  Flexion 50% limited pain   Extension 80% limited pain   Right lateral flexion 50%  Left lateral flexion 50%  Right rotation 50%  Left rotation 50%   (Blank rows = not tested)  LOWER EXTREMITY ROM:   Painful all planes of motion, passively moved each hip to hip flexion (100 deg), about 35 deg ER and 25 deg IR  LOWER EXTREMITY MMT:    MMT Right eval Left eval  Hip flexion 3 4-  Hip extension    Hip abduction 3 3  Hip adduction    Hip internal rotation    Hip external rotation    Knee flexion 3+ 5  Knee extension 4 5  Ankle dorsiflexion 4- 5  Ankle plantarflexion 3+ 3+  Ankle inversion    Ankle eversion     (Blank rows = not tested)  LUMBAR SPECIAL TESTS:  Straight leg raise test: Negative  FUNCTIONAL TESTS:  5 times sit to stand: 18 sec   Unable to do heel raise or walk on heels    Unable to do SLS    Semi tandem hold with need for min A  GAIT: Distance walked: 75 Assistive device utilized: None Level of assistance: Modified independence Comments: slow pace, limp  TODAY'S TREATMENT:         [facility] Adult PT Treatment:                                                DATE: 07/25/23 Therapeutic Exercise: NuStep L4 UE and LE for 6 min   Therapeutic Activity: BERG BALANCE  Sitting to Standing: Numbers; 0-4: 4 Standing unsupported: Numbers; 0-4: 4 Sitting unsupported: Numbers; 0-4: 4 Standing to Sitting: Numbers; 0-4: 4 Transfers: Numbers; 0-4: 3 Standing with eyes closed: Numbers; 0-4: 4 Standing with feet together: Numbers; 0-4: 3 Reaching forward with outstretched arm: Numbers; 0-4: 4 Retrieving object from the floor: Numbers; 0-4: 2 Turning to look behind: Numbers; 0-4: 4 Turning 360 degrees: Numbers; 0-4: 3 Place alternate foot on stool: Numbers; 0-4: 4 Standing with one foot in front:  Numbers; 0-4: 2 Standing on one foot: Numbers; 0-4: 1  Total Score: 46/56  Modalities: IFC with MHP 15 min seated  Self Care: TENS unit, balance HEP, fall risk    [facility] Adult PT Treatment:                                                DATE: 07/21/23 Therapeutic Exercise: NuStep L5 UE and LE for 5 min  Sidelying open book  x 10 , cues for technique  Hip abduction x 10, very challenging  Clam x 10 x 2 sets  Knee to chest added knee extension used towel under thigh Standing core press with ball  x 10  L stretch at ball  Standing row and extension x 15 green band  Modalities: IFC with MHP 15 min  seated to tolerance    [facility] Adult PT Treatment:                                                DATE: 07/19/23 Therapeutic Exercise: Supine Lower Trunk Rotation  x 10  Supine Pelvic Tilt - unable despite cues  Supine knee to chest with towel 20 sec x 3  Seated Hamstring Stretch /nerve flossing  Standing L stretch/ Flexion Stretch  NuStep L4 UE and LE  Modalities: IFC to tolerance (10/5 v) seated with MHP 15 min                                                                                                                  DATE: 07/04/23 PT eval:  Seated trunk flexion, hamstring, trunk rotation, sit to stand Verbal review of HEP Body mechanics sit to supine     PATIENT EDUCATION:  Education details: PT, POC , HEP Person educated: Patient Education method: Explanation Education comprehension: verbalized understanding  HOME EXERCISE PROGRAM: Access Code: [email or web address] URL: [email or web address] Date: 07/25/2023 Prepared by: Karie Mainland  Exercises - Supine Lower Trunk Rotation  - 1 x daily - 7 x weekly - 2 sets - 10 reps - 10 hold - Supine Pelvic Tilt  - 1 x daily - 7 x weekly - 2 sets - 10 reps - 5 hold - Seated Hamstring Stretch  - 1 x daily - 7 x weekly - 1 sets - 5 reps - 30 hold - Seated Flexion Stretch  - 1 x daily - 7 x weekly - 1 sets - 5 reps - 30 hold - Sit  to Stand Without Arm Support  - 1 x daily - 7 x weekly - 2 sets - 10 reps - Standing Tandem Balance with Counter Support  - 1 x daily - 7 x weekly - 1 sets - 3-5 reps - 30 hold - Heel Raises with Counter Support  - 1 x daily - 7 x weekly - 2 sets - 10 reps - 5 hold - Narrow Stance with Counter Support  - 1 x daily - 7 x weekly - 1 sets - 3-5 reps - 30 hold - Narrow Stance with Head Rotations and Unilateral Counter Support  - 1 x daily - 7 x weekly - 1 sets - 10 reps   ASSESSMENT:  CLINICAL IMPRESSION:  Patient continues to have severe back pain, limiting time in standing, walking. Her Berg Balance Score was 46/56 and she had marked difficulty with narrow stance, tandem.  Added HEP for standing balance.  She will continue to benefit from skilled PT in order to improve functional mobility and activity tolerance.     OBJECTIVE IMPAIRMENTS: Abnormal gait, decreased activity tolerance, decreased balance, decreased mobility, difficulty walking, decreased ROM, decreased strength, increased fascial restrictions, impaired flexibility, improper body mechanics, obesity, and pain.   ACTIVITY LIMITATIONS: carrying, lifting, bending,  sitting, standing, squatting, sleeping, stairs, transfers, bed mobility, and locomotion level  PARTICIPATION LIMITATIONS: meal prep, cleaning, shopping, and community activity  PERSONAL FACTORS: Time since onset of injury/illness/exacerbation and 3+ comorbidities: obesity, previous surgery x 2 , diabetes   are also affecting patient's functional outcome.   REHAB POTENTIAL: Fair due to ongoing issues s/p surgeries   CLINICAL DECISION MAKING: Stable/uncomplicated  EVALUATION COMPLEXITY: Low   GOALS: Goals reviewed with patient? Yes  SHORT TERM GOALS: Target date:    Patient will be independent with home program for trunk flexibility and core Baseline: Goal status: ongoing   2.  Patient will be able to tolerate supine exercises for therapy sessions Baseline:  Goal  status: ongoing   3.  Patient will complete balance screen and functional testing Baseline:  Goal status: MET   4.  Patient will report mild improvement in baseline lower back pain with functional activities  Baseline:  Goal status:ongoing   LONG TERM GOALS: Target date: 08/30/2023    Pt will be able to show I with HEP upon discharge  Baseline:  Goal status: INITIAL  2.  Pt will be able to stand for 20 min with pain < 4/10 for ADLs, light home tasks.  Baseline:  Goal status: INITIAL  3.  Pt will be able to walk in the community with LRAD for errands (20-30 min) with pain no more than 5/10  Baseline:  Goal status: INITIAL  4.  Berg Balance score will improve to 51/56 to demo improved fall risk.  Baseline: 46/56 cane recommended  Goal status: INITIAL  5.  Rt LE strength will improve to 4/5 for maximal support in standing and walking.  Baseline:  Goal status: INITIAL  6.  FOTO will improve to 50% upon discharge  Baseline: 34% Goal status: INITIAL  PLAN:  PT FREQUENCY: 1-2x/week  PT DURATION: 8 weeks  PLANNED INTERVENTIONS: Therapeutic exercises, Therapeutic activity, Neuromuscular re-education, Balance training, Gait training, Patient/Family education, Self Care, Joint mobilization, Aquatic Therapy, Electrical stimulation, Spinal mobilization, Cryotherapy, Moist heat, Manual therapy, and Re-evaluation.  PLAN FOR NEXT SESSION: check HEP, Standing balance   Karie Mainland, PT 07/25/23 12:56 PM Phone: [phone] Fax: [phone]   Karie Mainland, PT 07/25/23 12:56 PM Phone: [phone] Fax: [phone]

## 2023-07-25 NOTE — Telephone Encounter (Signed)
Next appt scheduled 12/2 with Dr Ned Card.

## 2023-07-25 NOTE — Patient Instructions (Signed)

## 2023-07-27 ENCOUNTER — Ambulatory Visit: Payer: 59 | Admitting: Physical Therapy

## 2023-07-28 ENCOUNTER — Emergency Department (HOSPITAL_COMMUNITY)
Admission: EM | Admit: 2023-07-28 | Discharge: 2023-07-28 | Disposition: A | Discharge status: Home / Self Care | Payer: 59 | Attending: Emergency Medicine | Admitting: Emergency Medicine

## 2023-07-28 ENCOUNTER — Emergency Department (HOSPITAL_COMMUNITY): Payer: 59

## 2023-07-28 ENCOUNTER — Encounter (HOSPITAL_COMMUNITY): Payer: Self-pay

## 2023-07-28 ENCOUNTER — Other Ambulatory Visit: Payer: Self-pay

## 2023-07-28 DIAGNOSIS — I10 Essential (primary) hypertension: Secondary | ICD-10-CM | POA: Insufficient documentation

## 2023-07-28 DIAGNOSIS — Z794 Long term (current) use of insulin: Secondary | ICD-10-CM | POA: Insufficient documentation

## 2023-07-28 DIAGNOSIS — Z7984 Long term (current) use of oral hypoglycemic drugs: Secondary | ICD-10-CM | POA: Diagnosis not present

## 2023-07-28 DIAGNOSIS — Z79899 Other long term (current) drug therapy: Secondary | ICD-10-CM | POA: Diagnosis not present

## 2023-07-28 DIAGNOSIS — M542 Cervicalgia: Secondary | ICD-10-CM | POA: Diagnosis not present

## 2023-07-28 DIAGNOSIS — E119 Type 2 diabetes mellitus without complications: Secondary | ICD-10-CM | POA: Diagnosis not present

## 2023-07-28 DIAGNOSIS — Z7951 Long term (current) use of inhaled steroids: Secondary | ICD-10-CM | POA: Insufficient documentation

## 2023-07-28 DIAGNOSIS — J449 Chronic obstructive pulmonary disease, unspecified: Secondary | ICD-10-CM | POA: Insufficient documentation

## 2023-07-28 DIAGNOSIS — J45909 Unspecified asthma, uncomplicated: Secondary | ICD-10-CM | POA: Insufficient documentation

## 2023-07-28 DIAGNOSIS — M25511 Pain in right shoulder: Secondary | ICD-10-CM | POA: Diagnosis present

## 2023-07-28 DIAGNOSIS — Y92410 Unspecified street and highway as the place of occurrence of the external cause: Secondary | ICD-10-CM | POA: Insufficient documentation

## 2023-07-28 MED ORDER — CYCLOBENZAPRINE HCL 10 MG PO TABS: 10.0000 mg | ORAL_TABLET | Freq: Two times a day (BID) | ORAL | 0 refills | Status: DC | PRN

## 2023-07-28 MED ORDER — LIDOCAINE 5 % EX PTCH
2.0000 | MEDICATED_PATCH | CUTANEOUS | Status: DC
  Administered 2023-07-28: 2 via TRANSDERMAL
  Filled 2023-07-28: qty 2

## 2023-07-28 MED ORDER — ACETAMINOPHEN 325 MG PO TABS
650.0000 mg | ORAL_TABLET | Freq: Once | ORAL | Status: AC
  Administered 2023-07-28: 650 mg via ORAL
  Filled 2023-07-28: qty 2

## 2023-07-28 NOTE — Discharge Instructions (Addendum)
Your CT and x-ray are reassuring today. Please take tylenol/ibuprofen/naproxen and muscle relaxant for pain. I recommend close follow-up with PCP for reevaluation.  Please do not hesitate to return to emergency department if worrisome signs symptoms we discussed become apparent.

## 2023-07-28 NOTE — ED Provider Notes (Signed)
Patient's care assumed by me at 3:30 PM patient is waiting for results of right shoulder x-ray.  Shoulder x-ray is read by the radiologist as no fracture.  Patient is counseled on results..  X-ray right shoulder shows no fracture.  CT cervical spine shows no traumatic injury.  Is discharged in stable condition   Kara Kelley 07/28/23 1608    Benjiman Core, MD 07/28/23 [phone]
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
## 2023-07-31 ENCOUNTER — Other Ambulatory Visit: Payer: Self-pay | Admitting: *Deleted

## 2023-07-31 DIAGNOSIS — G4700 Insomnia, unspecified: Secondary | ICD-10-CM

## 2023-07-31 MED ORDER — TRAZODONE HCL 50 MG PO TABS: 150.0000 mg | ORAL_TABLET | Freq: Every evening | ORAL | 0 refills | Status: DC | PRN

## 2023-07-31 MED ORDER — HYDROXYZINE HCL 25 MG PO TABS: 25.0000 mg | ORAL_TABLET | Freq: Three times a day (TID) | ORAL | 1 refills | Status: DC | PRN
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
## 2023-07-31 NOTE — Telephone Encounter (Signed)
First, pt is needing refills on Trazodone and Hydroxyzine.  Second, pt stated she was taken off Ozempic and she suppose to be on a different once a week medication; I told her it's not on her med list but I will ask the doctor.

## 2023-08-01 ENCOUNTER — Telehealth: Payer: Self-pay

## 2023-08-01 ENCOUNTER — Ambulatory Visit: Payer: 59 | Admitting: Physical Therapy

## 2023-08-01 ENCOUNTER — Encounter: Payer: Self-pay | Admitting: Physical Therapy

## 2023-08-01 DIAGNOSIS — M48062 Spinal stenosis, lumbar region with neurogenic claudication: Secondary | ICD-10-CM

## 2023-08-01 DIAGNOSIS — M5459 Other low back pain: Secondary | ICD-10-CM

## 2023-08-01 DIAGNOSIS — M6281 Muscle weakness (generalized): Secondary | ICD-10-CM | POA: Diagnosis not present

## 2023-08-01 DIAGNOSIS — R2689 Other abnormalities of gait and mobility: Secondary | ICD-10-CM

## 2023-08-01 NOTE — Telephone Encounter (Signed)
Pt   called stating she had a form for [facility]  that  she wanted to bring in to get filled our and signed I explained that  she would need and appt .[name] She opted  to keep her Dec appt and await till then to have the request done

## 2023-08-03 ENCOUNTER — Ambulatory Visit: Payer: 59 | Admitting: Physical Therapy

## 2023-08-08 ENCOUNTER — Ambulatory Visit: Payer: 59 | Admitting: Physical Therapy

## 2023-08-10 ENCOUNTER — Ambulatory Visit: Payer: 59 | Admitting: Physical Therapy

## 2023-08-11 ENCOUNTER — Other Ambulatory Visit: Payer: Self-pay

## 2023-08-11 DIAGNOSIS — N1832 Chronic kidney disease, stage 3b: Secondary | ICD-10-CM

## 2023-08-11 MED ORDER — ONETOUCH ULTRA TEST VI STRP: ORAL_STRIP | 0 refills | Status: DC

## 2023-08-15 ENCOUNTER — Ambulatory Visit: Payer: 59

## 2023-08-17 ENCOUNTER — Ambulatory Visit: Payer: 59 | Admitting: Physical Therapy

## 2023-08-22 ENCOUNTER — Telehealth: Payer: Self-pay

## 2023-08-22 ENCOUNTER — Encounter: Payer: 59 | Admitting: Physical Therapy

## 2023-08-22 NOTE — Telephone Encounter (Signed)
We received a referral for a previous patient of Dr Celene Squibb, last seen in 2017 for an [facility] EMG visit. The new referral is for progressive cognitive concerns and possible tardive dyskinesia. The referring provider, Kallie Locks FNP-C, X is requesting that the patient see Dr Terrace Arabia.  Is this ok to add to your schedule Dr Terrace Arabia?

## 2023-08-23 ENCOUNTER — Ambulatory Visit (INDEPENDENT_AMBULATORY_CARE_PROVIDER_SITE_OTHER): Payer: 59 | Admitting: Student

## 2023-08-23 ENCOUNTER — Other Ambulatory Visit: Payer: Self-pay

## 2023-08-23 VITALS — BP 143/75 | HR 69 | Temp 98.3°F | Ht 60.0 in | Wt 209.9 lb

## 2023-08-23 DIAGNOSIS — M542 Cervicalgia: Secondary | ICD-10-CM | POA: Diagnosis not present

## 2023-08-23 DIAGNOSIS — E1122 Type 2 diabetes mellitus with diabetic chronic kidney disease: Secondary | ICD-10-CM

## 2023-08-23 MED ORDER — DULOXETINE HCL 60 MG PO CPEP: 60.0000 mg | ORAL_CAPSULE | Freq: Every day | ORAL | 3 refills | Status: DC

## 2023-08-23 MED ORDER — TRESIBA FLEXTOUCH 100 UNIT/ML ~~LOC~~ SOPN
15.0000 [IU] | PEN_INJECTOR | Freq: Two times a day (BID) | SUBCUTANEOUS | 0 refills | Status: DC
Start: 2023-08-23 — End: 2023-10-13
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
## 2023-08-23 NOTE — Patient Instructions (Addendum)
Thank you so much for coming to the clinic today!   We are increasing your Cymbalta. This type of pain may take a while to heal, so I would recommend a lot of neck massages if possible, as well as picking up over the counter Icy hot or Voltaren gel.   If you have any questions please feel free to the call the clinic at anytime at [phone]. It was a pleasure seeing you!  Best, Dr. Thomasene Ripple
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
## 2023-08-24 ENCOUNTER — Ambulatory Visit: Payer: 59 | Admitting: Physical Therapy

## 2023-08-26 ENCOUNTER — Encounter: Payer: Self-pay | Admitting: Student

## 2023-08-26 NOTE — Telephone Encounter (Signed)
Called patient and she stated she went to [facility].  Care team updated and letter sent for eye exam notes.  Patient stated that her eye has been itching and she thinks she has a stye. Patient states that she does not have any crust forming, no fever and no other issues.   Informed patient to keep hands and eyes clean, to use a warm compress to help with swelling and a cold compress to help with itching. Advised patient to call for an appointment on Monday/Tuesday if stye does not improve.

## 2023-08-28 NOTE — Telephone Encounter (Signed)
Agreed thanks!

## 2023-08-29 ENCOUNTER — Encounter: Payer: 59 | Admitting: Physical Therapy

## 2023-08-31 ENCOUNTER — Other Ambulatory Visit: Payer: Self-pay | Admitting: Student

## 2023-08-31 DIAGNOSIS — G4700 Insomnia, unspecified: Secondary | ICD-10-CM

## 2023-08-31 NOTE — Progress Notes (Signed)
Internal Medicine Clinic Attending  Case discussed with the resident at the time of the visit.  We reviewed the resident's history and exam and pertinent patient test results.  I agree with the assessment, diagnosis, and plan of care documented in the resident's note.  

## 2023-09-05 ENCOUNTER — Telehealth: Payer: Self-pay | Admitting: *Deleted

## 2023-09-05 NOTE — Telephone Encounter (Signed)
Return pt's call - no answer; mailbox is full,unable to leave a message.I do not see a recent urine test.

## 2023-09-05 NOTE — Telephone Encounter (Signed)
Patient calling requesting urine test results from her last visit.

## 2023-09-07 ENCOUNTER — Other Ambulatory Visit: Payer: Self-pay | Admitting: Student

## 2023-09-07 DIAGNOSIS — M48061 Spinal stenosis, lumbar region without neurogenic claudication: Secondary | ICD-10-CM

## 2023-09-11 ENCOUNTER — Encounter: Payer: 59 | Admitting: Internal Medicine
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
## 2023-09-16 ENCOUNTER — Other Ambulatory Visit: Payer: Self-pay | Admitting: Student

## 2023-09-16 DIAGNOSIS — E1122 Type 2 diabetes mellitus with diabetic chronic kidney disease: Secondary | ICD-10-CM

## 2023-09-18 NOTE — Telephone Encounter (Signed)
  tiZANidine Expired 09/16/2023 Expired - tiZANidine (ZANAFLEX) 2 MG tablet    [id number] - [place], [place] - [address] AT [facility] [address] (Ph: [phone]

## 2023-09-19 ENCOUNTER — Telehealth: Payer: Self-pay | Admitting: Dietician

## 2023-09-19 NOTE — Telephone Encounter (Signed)
Call to find out if results of received fax of recent eye exam show retinopathy or not. Report shows "Retinal imaging reviewed- yes" , but was told they do not do diabetes eye exams because all their doctors are remote. Patient was asked to make an appointment elsewhere for a diabetes eye exam.

## 2023-09-20 ENCOUNTER — Other Ambulatory Visit: Payer: Self-pay | Admitting: Student

## 2023-09-20 ENCOUNTER — Telehealth: Payer: Self-pay | Admitting: Dietician

## 2023-09-20 NOTE — Telephone Encounter (Signed)
Eye exam from [facility] showed retinal imaging reviewed but no mention of diabetes or retinopathy or what the results of the images indicated. Call to [facility] they say they do not do diabetes eye exams because all their doctors are remote. they said thy refer their patients with diabetes to other doctor for a compete exam. Left message with [facility] to see if they can give [place] results of retinal imaging Left voicemail with patient for further information regarding diabetes eye exams and a return call

## 2023-09-20 NOTE — Telephone Encounter (Signed)
Sent in 1 week refill of Tizanidine for this patient. Last note states "I sent a new prescription for tizanidine 2 mg 3 times daily for 2 months until her next office visit in December. I canceled the Flexeril medication. I informed the patient about sedating effects of the tizanidine. I informed the patient of the goal is to slowly wean off of the tizanidine and increase Cymbalta as appropriate with physical therapy."  Her appt with the [facility] is on 12/20, so I sent in a one week supply to last her until this appt.

## 2023-09-25 ENCOUNTER — Other Ambulatory Visit: Payer: Self-pay

## 2023-09-25 DIAGNOSIS — E1122 Type 2 diabetes mellitus with diabetic chronic kidney disease: Secondary | ICD-10-CM

## 2023-09-25 MED ORDER — ONETOUCH ULTRA TEST VI STRP: ORAL_STRIP | 0 refills | Status: DC

## 2023-09-26 ENCOUNTER — Ambulatory Visit: Payer: 59 | Admitting: Nurse Practitioner

## 2023-09-27 ENCOUNTER — Other Ambulatory Visit: Payer: Self-pay

## 2023-09-27 ENCOUNTER — Other Ambulatory Visit: Payer: Self-pay | Admitting: Internal Medicine

## 2023-09-27 DIAGNOSIS — K219 Gastro-esophageal reflux disease without esophagitis: Secondary | ICD-10-CM

## 2023-09-27 MED ORDER — OMEPRAZOLE 40 MG PO CPDR: DELAYED_RELEASE_CAPSULE | ORAL | 0 refills | Status: DC

## 2023-09-27 NOTE — Telephone Encounter (Signed)
Next appt scheduled 12/20 with Dr Merrilee Jansky.

## 2023-09-29 ENCOUNTER — Encounter: Payer: 59 | Admitting: Student

## 2023-10-01 ENCOUNTER — Other Ambulatory Visit: Payer: Self-pay | Admitting: Student

## 2023-10-02 ENCOUNTER — Other Ambulatory Visit: Payer: Self-pay | Admitting: Student

## 2023-10-02 DIAGNOSIS — E1122 Type 2 diabetes mellitus with diabetic chronic kidney disease: Secondary | ICD-10-CM

## 2023-10-05 ENCOUNTER — Other Ambulatory Visit: Payer: Self-pay | Admitting: Student

## 2023-10-10 ENCOUNTER — Other Ambulatory Visit: Payer: Self-pay | Admitting: Student

## 2023-10-10 DIAGNOSIS — E1122 Type 2 diabetes mellitus with diabetic chronic kidney disease: Secondary | ICD-10-CM

## 2023-10-17 ENCOUNTER — Other Ambulatory Visit: Payer: Self-pay | Admitting: Student

## 2023-10-18 NOTE — Telephone Encounter (Signed)
 Patient has been scheduled to come in 11/01/23.

## 2023-10-25 ENCOUNTER — Other Ambulatory Visit: Payer: Self-pay | Admitting: Student

## 2023-10-25 DIAGNOSIS — E1122 Type 2 diabetes mellitus with diabetic chronic kidney disease: Secondary | ICD-10-CM

## 2023-10-26 NOTE — Telephone Encounter (Signed)
Medication sent to pharmacy  

## 2023-10-30 ENCOUNTER — Other Ambulatory Visit: Payer: Self-pay | Admitting: Student

## 2023-10-30 DIAGNOSIS — N1832 Chronic kidney disease, stage 3b: Secondary | ICD-10-CM

## 2023-11-01 ENCOUNTER — Ambulatory Visit (INDEPENDENT_AMBULATORY_CARE_PROVIDER_SITE_OTHER): Payer: 59 | Admitting: Student

## 2023-11-01 ENCOUNTER — Encounter: Payer: Self-pay | Admitting: Student

## 2023-11-01 VITALS — BP 158/82 | HR 73 | Temp 98.2°F | Ht 60.0 in | Wt 222.5 lb

## 2023-11-01 DIAGNOSIS — Z7984 Long term (current) use of oral hypoglycemic drugs: Secondary | ICD-10-CM

## 2023-11-01 DIAGNOSIS — M4712 Other spondylosis with myelopathy, cervical region: Secondary | ICD-10-CM

## 2023-11-01 DIAGNOSIS — M48061 Spinal stenosis, lumbar region without neurogenic claudication: Secondary | ICD-10-CM | POA: Diagnosis not present

## 2023-11-01 DIAGNOSIS — E1165 Type 2 diabetes mellitus with hyperglycemia: Secondary | ICD-10-CM | POA: Diagnosis not present

## 2023-11-01 DIAGNOSIS — Z1211 Encounter for screening for malignant neoplasm of colon: Secondary | ICD-10-CM

## 2023-11-01 DIAGNOSIS — Z23 Encounter for immunization: Secondary | ICD-10-CM | POA: Diagnosis not present

## 2023-11-01 DIAGNOSIS — Z794 Long term (current) use of insulin: Secondary | ICD-10-CM

## 2023-11-01 DIAGNOSIS — I10 Essential (primary) hypertension: Secondary | ICD-10-CM

## 2023-11-01 DIAGNOSIS — M4722 Other spondylosis with radiculopathy, cervical region: Secondary | ICD-10-CM | POA: Diagnosis not present

## 2023-11-01 DIAGNOSIS — N1832 Chronic kidney disease, stage 3b: Secondary | ICD-10-CM

## 2023-11-01 LAB — POCT GLYCOSYLATED HEMOGLOBIN (HGB A1C): Hemoglobin A1C: 7.4 % — AB (ref 4.0–5.6)

## 2023-11-01 LAB — GLUCOSE, CAPILLARY: Glucose-Capillary: 92 mg/dL (ref 70–99)

## 2023-11-01 MED ORDER — TIRZEPATIDE 5 MG/0.5ML ~~LOC~~ SOAJ: 5.0000 mg | SUBCUTANEOUS | 2 refills | Status: AC

## 2023-11-01 MED ORDER — DICLOFENAC SODIUM 1 % EX GEL: 4.0000 g | Freq: Four times a day (QID) | CUTANEOUS | 2 refills | Status: DC

## 2023-11-01 MED ORDER — TRESIBA FLEXTOUCH 100 UNIT/ML ~~LOC~~ SOPN: 10.0000 [IU] | PEN_INJECTOR | Freq: Every day | SUBCUTANEOUS | Status: DC

## 2023-11-01 MED ORDER — EMPAGLIFLOZIN 10 MG PO TABS
10.0000 mg | ORAL_TABLET | Freq: Every day | ORAL | 2 refills | Status: DC
Start: 2023-11-01 — End: 2024-02-08

## 2023-11-01 NOTE — Assessment & Plan Note (Signed)
Patient has a history of back pain.  She has been seen for this multiple times.  She reports the same pain today.  She has been increased on her Cymbalta to 60 mg.  However, when asked about which medicine she is taking, she is unsure.  On my exam today, she does not have much tenderness noted.  Given that she is unsure if she is taking Cymbalta or not, I encouraged her to take this medication.  Patient agrees to this.  Will also write Voltaren gel for patient.   Plan: -Voltaren gel -Cymbalta 60 mg daily -If she continues to have issues, she might need to reach out to her neurosurgeon

## 2023-11-01 NOTE — Assessment & Plan Note (Signed)
Patient has a past medical history of hypertension.  Current medication includes lisinopril 20 mg daily.  On exam today, patient has blood pressure of 156/96.  She denies any chest pain, shortness of breath, vision changes, or headache.  Plan: -Will have patient keep a blood pressure log and return in about 1 month for recheck -Continue lisinopril 20 mg daily -Follow-up BMP

## 2023-11-01 NOTE — Assessment & Plan Note (Signed)
Patient referred for colonoscopy

## 2023-11-01 NOTE — Progress Notes (Signed)
CC: Diabetes follow-up  HPI:  Kara Kelley is a 68 y.o. [name] is a 68 y.o. female with past medical history of hypertension, hyperthyroidism, type 2 diabetes who presents for follow-up visit.  Please see assessment and plan for full HPI.   Meds: Type 2 diabetes mellitus: Tresiba 20 units once a day metformin 500 mg daily, Humalog 5 units 3 times daily, supposed to be on Mounjaro, Vitamin D deficiency: Vitamin D supplementation Hypertension: Lisinopril 20 mg daily GERD: Omeprazole 40 mg daily, Linzess 145 mcg daily Insomnia: Trazodone 150 mg nightly as needed Pain: Flexeril 10 mg 3 times daily Hyperlipidemia: Atorvastatin 10 mg daily Mood disorder: Abilify 10 mg daily, Wellbutrin 300 mg twice daily, Atarax 25 mg every 8 hours Asthma: Advair  Past Medical History:  Diagnosis Date   Anemia    Anxiety    Arthritis    Asthma    Bacterial vaginosis 05/16/2022   Bunion    Callus    Candida vaginitis 09/16/2022   Cholelithiasis without obstruction 04/11/2022   Chronic pain    Cocaine abuse (HCC)    COPD (chronic obstructive pulmonary disease) (HCC)    Corns and callosities    Degenerative joint disease    Depression    Diabetes mellitus    Endometrial polyp    ETOH abuse    Gall stones    Gallbladder calculus with acute cholecystitis and no obstruction 04/11/2022   GERD (gastroesophageal reflux disease)    Headache    history of Migraines   Hepatitis C    Hep C   History of adenomatous polyp of colon 04/11/2022   History of endometrial ablation 02/06/2013   Hyperlipidemia    Hypertension    Insomnia    Spondylolisthesis of lumbar region    Substance abuse (HCC)    alcoholism   Tuberculosis 1985   Wears dentures    Wears glasses      Current Outpatient Medications:    diclofenac Sodium (VOLTAREN) 1 % GEL, Apply 4 g topically 4 (four) times daily., Disp: 100 g, Rfl: 2   empagliflozin (JARDIANCE) 10 MG TABS tablet, Take 1 tablet (10 mg total)  by mouth daily before breakfast., Disp: 30 tablet, Rfl: 2   tirzepatide (MOUNJARO) 5 MG/0.5ML Pen, Inject 5 mg into the skin once a week., Disp: 2 mL, Rfl: 2   Accu-Chek FastClix Lancets MISC, Use as directed to check blood sugars 2 times per day dx: e11.65, Disp: 102 each, Rfl: prn   Accu-Chek Softclix Lancets lancets, Use as instructed to check blood sugars twice daily E11.69, Disp: 100 each, Rfl: 2   acetaminophen (TYLENOL) 325 MG tablet, Take 1-2 tablets (325-650 mg total) by mouth every 4 (four) hours as needed for mild pain., Disp: , Rfl:    ADVAIR DISKUS 250-50 MCG/ACT AEPB, INHALE 1 PUFF INTO THE LUNGS IN THE MORNING AND AT BEDTIME, Disp: 60 each, Rfl: 6   albuterol (VENTOLIN HFA) 108 (90 Base) MCG/ACT inhaler, Inhale 2 puffs into the lungs every 6 (six) hours as needed for wheezing or shortness of breath. (Patient taking differently: Inhale 2 puffs into the lungs 2 (two) times daily as needed for wheezing or shortness of breath.), Disp: 18 g, Rfl: 2   ARIPiprazole (ABILIFY) 10 MG tablet, Take 10 mg by mouth daily. , Disp: , Rfl:    atorvastatin (LIPITOR) 10 MG tablet, Take 1 tablet (10 mg total) by mouth daily., Disp: 30 tablet, Rfl: 2   Blood Glucose Monitoring Suppl (  ACCU-CHEK GUIDE ME) w/Device KIT, Use to check blood sugars twice daily E11.69, Disp: 1 kit, Rfl: 1   buPROPion (WELLBUTRIN XL) 300 MG 24 hr tablet, Take 1 tablet (300 mg total) by mouth daily. (Patient taking differently: Take 300 mg by mouth in the morning and at bedtime.), Disp: 90 tablet, Rfl: 0   DULoxetine (CYMBALTA) 60 MG capsule, Take 1 capsule (60 mg total) by mouth daily., Disp: 90 capsule, Rfl: 3   hydrOXYzine (ATARAX) 25 MG tablet, Take 1 tablet (25 mg total) by mouth every 8 (eight) hours as needed for anxiety., Disp: 30 tablet, Rfl: 1   insulin degludec (TRESIBA FLEXTOUCH) 100 UNIT/ML FlexTouch Pen, Inject 10 Units into the skin at bedtime., Disp: , Rfl:    insulin lispro (HUMALOG KWIKPEN) 100 UNIT/ML KwikPen,  ADMINISTER 5 UNITS UNDER THE SKIN THREE TIMES DAILY WITH MEALS, Disp: 3 mL, Rfl: 1   Insulin Pen Needle (PEN NEEDLES) 31G X 8 MM MISC, UAD, Disp: 100 each, Rfl: 0   Insulin Syringe-Needle U-100 (INSULIN SYRINGE .3CC/30GX5/16") 30G X 5/16" 0.3 ML MISC, Use to inject Levemir twice daily., Disp: 100 each, Rfl: 2   Iron, Ferrous Sulfate, 325 (65 Fe) MG TABS, Take 325 mg by mouth daily., Disp: 30 tablet, Rfl: 2   Lancet Device MISC, 1 each by Does not apply route in the morning, at noon, and at bedtime. May substitute to any manufacturer covered by patient's insurance., Disp: 100 each, Rfl: 0   LINZESS 145 MCG CAPS capsule, Take 145 mcg by mouth daily before breakfast. , Disp: , Rfl:    lisinopril (ZESTRIL) 20 MG tablet, Take 1 tablet (20 mg total) by mouth daily., Disp: 30 tablet, Rfl: 11   metFORMIN (GLUCOPHAGE-XR) 500 MG 24 hr tablet, TAKE 1 TABLET(500 MG) BY MOUTH DAILY WITH BREAKFAST, Disp: 90 tablet, Rfl: 0   Multiple Vitamins-Minerals (MULTIVITAMIN WITH MINERALS) tablet, Take 1 tablet by mouth daily. Alive, Disp: , Rfl:    omeprazole (PRILOSEC) 40 MG capsule, TAKE 1 CAPSULE(40 MG) BY MOUTH DAILY BEFORE BREAKFAST, Disp: 90 capsule, Rfl: 0   ondansetron (ZOFRAN-ODT) 4 MG disintegrating tablet, Take 1 tablet (4 mg total) by mouth every 8 (eight) hours as needed for nausea or vomiting., Disp: 30 tablet, Rfl: 1   ONETOUCH ULTRA test strip, USE AS DIRECTED, Disp: 100 strip, Rfl: 0   traZODone (DESYREL) 50 MG tablet, TAKE 3 TABLETS(150 MG) BY MOUTH AT BEDTIME AS NEEDED FOR SLEEP, Disp: 90 tablet, Rfl: 0  Review of Systems:    MSK: Patient endorses back pain  Physical Exam:  Vitals:   11/01/23 1403 11/01/23 1430  BP: (!) 156/96 (!) 158/82  Pulse: 75 73  Temp: 98.2 F (36.8 C)   TempSrc: Oral   SpO2: 100%   Weight: 222 lb 8 oz (100.9 kg)   Height: 5' (1.524 m)    General: Patient is sitting comfortably in the room  Cardio: Regular rate and rhythm, no murmurs, rubs or gallops Pulmonary:  Clear to ausculation bilaterally with no rales, rhonchi, and crackles  Abdomen: Soft, nontender, no rebound or guarding Back: No midline spinal tenderness, some lumbar paraspinal muscle tenderness appreciated   Assessment & Plan:   Essential hypertension Patient has a past medical history of hypertension.  Current medication includes lisinopril 20 mg daily.  On exam today, patient has blood pressure of 156/96.  She denies any chest pain, shortness of breath, vision changes, or headache.  Plan: -Will have patient keep a blood pressure log and return in  about 1 month for recheck -Continue lisinopril 20 mg daily -Follow-up BMP  Uncontrolled type 2 diabetes mellitus with hyperglycemia (HCC) Patient presents for diabetes follow-up.  4 months ago A1c was at 8.1, and down to 7.4 today.  Her current regimen includes metformin 1000 mg twice daily, Tresiba 10 units daily, Humalog 5 units 3 times daily, Mounjaro 2.5 mg weekly.  She states she is adherent to her medications.  She denies any polyuria or polydipsia.  A1c is slowly improving.  Given patient has CKD, do recommend starting SGLT2 inhibitor today.  Plan: -Continue metformin 1000 mg twice daily -Continue Tresiba 10 units daily -Continue Humalog 5 units 3 times daily -Increase Mounjaro to 5 mg weekly -Start Jardiance 10 mg daily -Obtain BMP today -Follow-up in 3 months - If patient continues to tolerate Mounjaro well can titrate up to 7.5 mg next month  Spinal stenosis, lumbar Patient has a history of back pain.  She has been seen for this multiple times.  She reports the same pain today.  She has been increased on her Cymbalta to 60 mg.  However, when asked about which medicine she is taking, she is unsure.  On my exam today, she does not have much tenderness noted.  Given that she is unsure if she is taking Cymbalta or not, I encouraged her to take this medication.  Patient agrees to this.  Will also write Voltaren gel for patient.    Plan: -Voltaren gel -Cymbalta 60 mg daily -If she continues to have issues, she might need to reach out to her neurosurgeon  Colon cancer screening Patient referred for colonoscopy.  Cervical spondylosis with myelopathy and radiculopathy Administered Tdap vaccine today.  Vaccine for streptococcus pneumoniae and influenza Administered pneumonia vaccine today.  Patient discussed with Dr. Letta Kocher, DO PGY-2 Internal Medicine Resident  Pager: [phone]

## 2023-11-01 NOTE — Patient Instructions (Addendum)
Sonda Primes you for allowing me to take part in your care today.  Here are your instructions.  1.  Regarding your diabetes, please continue taking Tresiba 20 units daily.  Please continue taking your Humalog.  Please continue taking your metformin.  Increase your Mounjaro to 5 mg weekly.  I have started Jardiance.  Please take this once a day.  2.  I am checking your kidney function.  I will call with the results.  3.  For your pain, this is likely musculoskeletal.  I will give you some gel to apply to the area.  Continue taking Cymbalta.  4.  Please come back in 1 month, and if you are doing well on Mounjaro we can potentially increase your dose of Mounjaro as well.  5.  I have referred you for colonoscopy.  Please await phone call to schedule.  6.  You have received vaccines today.  If you do develop any fevers please take Tylenol.  Thank you, Dr. Allena Katz  If you have any other questions please contact the internal medicine clinic at [phone] If it is after hours, please call the [facility] at [phone] and then ask the person who picks up for the resident on call.

## 2023-11-01 NOTE — Assessment & Plan Note (Signed)
Administered Tdap vaccine today.

## 2023-11-01 NOTE — Assessment & Plan Note (Addendum)
Patient presents for diabetes follow-up.  4 months ago A1c was at 8.1, and down to 7.4 today.  Her current regimen includes metformin 1000 mg twice daily, Tresiba 10 units daily, Humalog 5 units 3 times daily, Mounjaro 2.5 mg weekly.  She states she is adherent to her medications.  She denies any polyuria or polydipsia.  A1c is slowly improving.  Given patient has CKD, do recommend starting SGLT2 inhibitor today.  Plan: -Continue metformin 1000 mg twice daily -Continue Tresiba 10 units daily -Continue Humalog 5 units 3 times daily -Increase Mounjaro to 5 mg weekly -Start Jardiance 10 mg daily -Obtain BMP today -Follow-up in 3 months - If patient continues to tolerate Mounjaro well can titrate up to 7.5 mg next month

## 2023-11-01 NOTE — Assessment & Plan Note (Signed)
Administered pneumonia vaccine today.

## 2023-11-02 ENCOUNTER — Encounter: Payer: Self-pay | Admitting: Student

## 2023-11-02 LAB — BMP8+ANION GAP
Anion Gap: 16 mmol/L (ref 10.0–18.0)
BUN/Creatinine Ratio: 9 — ABNORMAL LOW (ref 12–28)
BUN: 11 mg/dL (ref 8–27)
CO2: 20 mmol/L (ref 20–29)
Calcium: 8.5 mg/dL — ABNORMAL LOW (ref 8.7–10.3)
Chloride: 98 mmol/L (ref 96–106)
Creatinine, Ser: 1.21 mg/dL — ABNORMAL HIGH (ref 0.57–1.00)
Glucose: 110 mg/dL — ABNORMAL HIGH (ref 70–99)
Potassium: 4.1 mmol/L (ref 3.5–5.2)
Sodium: 134 mmol/L (ref 134–144)
eGFR: 50 mL/min/{1.73_m2} — ABNORMAL LOW (ref >59)

## 2023-11-02 NOTE — Progress Notes (Signed)
 Internal Medicine Clinic Attending  Case discussed with the resident physician at the time of the visit.  We reviewed the patient's history, exam, and pertinent patient test results.  I agree with the assessment, diagnosis, and plan of care documented in the resident's note.

## 2023-11-08 ENCOUNTER — Other Ambulatory Visit: Payer: Self-pay

## 2023-11-08 ENCOUNTER — Ambulatory Visit
Admission: RE | Admit: 2023-11-08 | Discharge: 2023-11-08 | Disposition: A | Discharge status: Home / Self Care | Payer: 59 | Source: Ambulatory Visit | Attending: Internal Medicine | Admitting: Internal Medicine

## 2023-11-08 ENCOUNTER — Other Ambulatory Visit: Payer: Self-pay | Admitting: Student

## 2023-11-08 DIAGNOSIS — Z13820 Encounter for screening for osteoporosis: Secondary | ICD-10-CM

## 2023-11-08 DIAGNOSIS — G4700 Insomnia, unspecified: Secondary | ICD-10-CM

## 2023-11-08 MED ORDER — TRAZODONE HCL 50 MG PO TABS: 50.0000 mg | ORAL_TABLET | Freq: Every evening | ORAL | 0 refills | Status: DC | PRN

## 2023-11-08 NOTE — Telephone Encounter (Signed)
Patient is also requesting a rx refill for Tizanidine, medication expired off of med list

## 2023-11-09 ENCOUNTER — Telehealth: Payer: Self-pay | Admitting: Student

## 2023-11-09 ENCOUNTER — Encounter: Payer: Self-pay | Admitting: Student

## 2023-11-09 ENCOUNTER — Other Ambulatory Visit: Payer: Self-pay | Admitting: Student

## 2023-11-09 DIAGNOSIS — M810 Age-related osteoporosis without current pathological fracture: Secondary | ICD-10-CM | POA: Insufficient documentation

## 2023-11-09 DIAGNOSIS — N1832 Chronic kidney disease, stage 3b: Secondary | ICD-10-CM

## 2023-11-09 MED ORDER — ALENDRONATE SODIUM 10 MG PO TABS: 10.0000 mg | ORAL_TABLET | Freq: Every day | ORAL | 11 refills | Status: DC

## 2023-11-09 NOTE — Assessment & Plan Note (Signed)
New diagnosis of osteoporosis.  Starting alendronate 10 mg daily.

## 2023-11-09 NOTE — Telephone Encounter (Signed)
Medication sent to pharmacy

## 2023-11-09 NOTE — Telephone Encounter (Signed)
Please see result note.  Called patient regarding new diagnosis of osteoporosis.  Starting alendronate 10 mg daily.  Patient prefers daily dosing over weekly dosing.

## 2023-11-10 ENCOUNTER — Emergency Department (HOSPITAL_COMMUNITY): Payer: 59

## 2023-11-10 ENCOUNTER — Encounter (HOSPITAL_COMMUNITY): Payer: Self-pay

## 2023-11-10 ENCOUNTER — Ambulatory Visit (HOSPITAL_COMMUNITY)
Admission: EM | Admit: 2023-11-10 | Discharge: 2023-11-10 | Disposition: A | Discharge status: Home / Self Care | Payer: 59

## 2023-11-10 ENCOUNTER — Other Ambulatory Visit: Payer: Self-pay

## 2023-11-10 ENCOUNTER — Emergency Department (HOSPITAL_COMMUNITY)
Admission: EM | Admit: 2023-11-10 | Discharge: 2023-11-10 | Discharge status: Against Medical Advice | Payer: 59 | Attending: Emergency Medicine | Admitting: Emergency Medicine

## 2023-11-10 ENCOUNTER — Encounter (HOSPITAL_COMMUNITY): Payer: Self-pay | Admitting: Emergency Medicine

## 2023-11-10 DIAGNOSIS — H538 Other visual disturbances: Secondary | ICD-10-CM | POA: Insufficient documentation

## 2023-11-10 DIAGNOSIS — R531 Weakness: Secondary | ICD-10-CM

## 2023-11-10 DIAGNOSIS — R519 Headache, unspecified: Secondary | ICD-10-CM | POA: Diagnosis not present

## 2023-11-10 DIAGNOSIS — R4182 Altered mental status, unspecified: Secondary | ICD-10-CM | POA: Diagnosis not present

## 2023-11-10 DIAGNOSIS — M852 Hyperostosis of skull: Secondary | ICD-10-CM | POA: Insufficient documentation

## 2023-11-10 DIAGNOSIS — I6782 Cerebral ischemia: Secondary | ICD-10-CM | POA: Insufficient documentation

## 2023-11-10 DIAGNOSIS — R29810 Facial weakness: Secondary | ICD-10-CM | POA: Insufficient documentation

## 2023-11-10 DIAGNOSIS — Z5321 Procedure and treatment not carried out due to patient leaving prior to being seen by health care provider: Secondary | ICD-10-CM | POA: Insufficient documentation

## 2023-11-10 LAB — URINALYSIS, ROUTINE W REFLEX MICROSCOPIC
Bilirubin Urine: NEGATIVE
Glucose, UA: >=500 mg/dL — AB
Hgb urine dipstick: NEGATIVE
Ketones, ur: NEGATIVE mg/dL
Nitrite: NEGATIVE
Protein, ur: NEGATIVE mg/dL
Specific Gravity, Urine: 1.005 (ref 1.005–1.030)
pH: 6 (ref 5.0–8.0)

## 2023-11-10 LAB — RAPID URINE DRUG SCREEN, HOSP PERFORMED
Amphetamines: NOT DETECTED
Barbiturates: NOT DETECTED
Benzodiazepines: NOT DETECTED
Cocaine: NOT DETECTED
Opiates: NOT DETECTED
Tetrahydrocannabinol: NOT DETECTED

## 2023-11-10 LAB — CBC
HCT: 37.1 % (ref 36.0–46.0)
Hemoglobin: 11.8 g/dL — ABNORMAL LOW (ref 12.0–15.0)
MCH: 24.5 pg — ABNORMAL LOW (ref 26.0–34.0)
MCHC: 31.8 g/dL (ref 30.0–36.0)
MCV: 77.1 fL — ABNORMAL LOW (ref 80.0–100.0)
Platelets: 130 10*3/uL — ABNORMAL LOW (ref 150–400)
RBC: 4.81 MIL/uL (ref 3.87–5.11)
RDW: 14.7 % (ref 11.5–15.5)
WBC: 5.6 10*3/uL (ref 4.0–10.5)
nRBC: 0 % (ref 0.0–0.2)

## 2023-11-10 LAB — DIFFERENTIAL
Abs Immature Granulocytes: 0 10*3/uL (ref 0.00–0.07)
Basophils Absolute: 0.1 10*3/uL (ref 0.0–0.1)
Basophils Relative: 2 %
Eosinophils Absolute: 0.3 10*3/uL (ref 0.0–0.5)
Eosinophils Relative: 5 %
Lymphocytes Relative: 44 %
Lymphs Abs: 2.5 10*3/uL (ref 0.7–4.0)
Monocytes Absolute: 0.1 10*3/uL (ref 0.1–1.0)
Monocytes Relative: 2 %
Neutro Abs: 2.6 10*3/uL (ref 1.7–7.7)
Neutrophils Relative %: 47 %
nRBC: 0 /100{WBCs}

## 2023-11-10 LAB — I-STAT CHEM 8, ED
BUN: 13 mg/dL (ref 8–23)
Calcium, Ion: 1.14 mmol/L — ABNORMAL LOW (ref 1.15–1.40)
Chloride: 110 mmol/L (ref 98–111)
Creatinine, Ser: 1.3 mg/dL — ABNORMAL HIGH (ref 0.44–1.00)
Glucose, Bld: 141 mg/dL — ABNORMAL HIGH (ref 70–99)
HCT: 38 % (ref 36.0–46.0)
Hemoglobin: 12.9 g/dL (ref 12.0–15.0)
Potassium: 4.3 mmol/L (ref 3.5–5.1)
Sodium: 141 mmol/L (ref 135–145)
TCO2: 22 mmol/L (ref 22–32)

## 2023-11-10 LAB — COMPREHENSIVE METABOLIC PANEL
ALT: 15 U/L (ref 0–44)
AST: 20 U/L (ref 15–41)
Albumin: 3.9 g/dL (ref 3.5–5.0)
Alkaline Phosphatase: 117 U/L (ref 38–126)
Anion gap: 9 (ref 5–15)
BUN: 11 mg/dL (ref 8–23)
CO2: 20 mmol/L — ABNORMAL LOW (ref 22–32)
Calcium: 9.1 mg/dL (ref 8.9–10.3)
Chloride: 109 mmol/L (ref 98–111)
Creatinine, Ser: 1.31 mg/dL — ABNORMAL HIGH (ref 0.44–1.00)
GFR, Estimated: 45 mL/min — ABNORMAL LOW (ref >60)
Glucose, Bld: 148 mg/dL — ABNORMAL HIGH (ref 70–99)
Potassium: 4.2 mmol/L (ref 3.5–5.1)
Sodium: 138 mmol/L (ref 135–145)
Total Bilirubin: 0.3 mg/dL (ref 0.0–1.2)
Total Protein: 7.4 g/dL (ref 6.5–8.1)

## 2023-11-10 LAB — PROTIME-INR
INR: 1 (ref 0.8–1.2)
Prothrombin Time: 13.5 s (ref 11.4–15.2)

## 2023-11-10 LAB — ETHANOL: Alcohol, Ethyl (B): <10 mg/dL (ref <10)

## 2023-11-10 LAB — APTT: aPTT: 26 s (ref 24–36)

## 2023-11-10 NOTE — ED Triage Notes (Signed)
PT sent over from PCP for evaluation of right sided facial droop that started yesterday. Pt states she has had headache and blurred vision for 2 weeks, weakness on left side for 2 months.

## 2023-11-10 NOTE — ED Triage Notes (Addendum)
Patient's daughter reports that th epatient had left facial drooping yesterday at approx 1300 yesterday. Patient was having a virtual visit and states she was told to come  to the UC. Patient also c/o headache. Patient also states, "my left side has had weakness and states it has been going on for a while."    No hand drift, leg drift. No slurred speech.

## 2023-11-10 NOTE — Discharge Instructions (Addendum)
 Go to ER for further evaluation and treatment.

## 2023-11-10 NOTE — ED Provider Triage Note (Cosign Needed)
Emergency Medicine Provider Triage Evaluation Note  ALIKA EPPES , a 68 y.o. female  was evaluated in triage.  Pt complains of facial droop.  Review of Systems  Positive:  Negative:   Physical Exam  BP (!) 143/72 (BP Location: Right Arm)   Pulse 70   Temp 98.4 F (36.9 C) (Oral)   Resp (!) 25   Ht 4\' 11"  (1.499 m)   Wt 93 kg   SpO2 99%   BMI 41.40 kg/m  Gen:   Awake, no distress   Resp:  Normal effort  MSK:   Moves extremities without difficulty  Other:    Medical Decision Making  Medically screening exam initiated at 6:01 PM.  Appropriate orders placed.  MARCELA ALATORRE was informed that the remainder of the evaluation will be completed by another provider, this initial triage assessment does not replace that evaluation, and the importance of remaining in the ED until their evaluation is complete.  Right sided facial droop since 1PM yesterday.  Patient with headache and blurred vision x2 weeks. Weakness of left side x2 months. Patient with history of migraines but states that this feels different. Patient went to PCP who referred her to ED.   Dorthy Cooler, [place] 11/10/23 [phone]

## 2023-11-10 NOTE — ED Triage Notes (Signed)
Patient is being discharged from the Urgent Care and sent to the Emergency Department via POV . Per Georgina Quint., NP, patient is in need of higher level of care due to stroke symptoms. Patient is aware and verbalizes understanding of plan of care.  Vitals:   11/10/23 1723  BP: (!) 144/80  Pulse: 81  Temp: (!) 97.3 F (36.3 C)  SpO2: 96%

## 2023-11-10 NOTE — ED Notes (Signed)
PT decided to leave . 

## 2023-11-10 NOTE — ED Provider Notes (Signed)
[facility]    CSN: [id number] Arrival date & time: 11/10/23  1716      History   Chief Complaint No chief complaint on file.   HPI Kara Kelley is a 68 y.o. female.   Patient presents with daughter for facial droop that began yesterday around 1 PM.  Patient states she had a virtual visit and was told to come to the urgent care for symptoms.  Patient also endorses headache and left-sided weakness that she states has been going on for "awhile".     Past Medical History:  Diagnosis Date   Anemia    Anxiety    Arthritis    Asthma    Bacterial vaginosis 05/16/2022   Bunion    Callus    Candida vaginitis 09/16/2022   Cholelithiasis without obstruction 04/11/2022   Chronic pain    Cocaine abuse (HCC)    COPD (chronic obstructive pulmonary disease) (HCC)    Corns and callosities    Degenerative joint disease    Depression    Diabetes mellitus    Endometrial polyp    ETOH abuse    Gall stones    Gallbladder calculus with acute cholecystitis and no obstruction 04/11/2022   GERD (gastroesophageal reflux disease)    Headache    history of Migraines   Hepatitis C    Hep C   History of adenomatous polyp of colon 04/11/2022   History of endometrial ablation 02/06/2013   Hyperlipidemia    Hypertension    Insomnia    Spondylolisthesis of lumbar region    Substance abuse (HCC)    alcoholism   Tuberculosis 1985   Wears dentures    Wears glasses     Patient Active Problem List   Diagnosis Date Noted   Osteoporosis 11/09/2023   Need for diphtheria-tetanus-pertussis (Tdap) vaccine 11/01/2023   Vaccine for streptococcus pneumoniae and influenza 11/01/2023   Neck pain 08/23/2023   Colon cancer screening 03/01/2023   Spinal stenosis, lumbar 08/24/2022   Morbid obesity (HCC) 04/11/2022   Polysubstance abuse (HCC) 04/11/2022   Hyperthyroidism 12/29/2021   Cervical spondylosis with myelopathy and radiculopathy 10/26/2021   Hypertensive retinopathy of both  eyes 03/06/2019   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) 03/06/2019   Essential hypertension 10/05/2018   Uncontrolled type 2 diabetes mellitus with hyperglycemia (HCC)    Chronic hepatitis C (HCC) 12/09/2015   Gastroesophageal reflux disease without esophagitis 05/22/2014    Past Surgical History:  Procedure Laterality Date   ANTERIOR CERVICAL DECOMP/DISCECTOMY FUSION N/A 10/26/2021   Procedure: ACDF - C4-C5 - C5-C6 - C6-C7;  Surgeon: Julio Sicks, MD;  Location: [facility];  Service: Neurosurgery;  Laterality: N/A;   BACK SURGERY  2018   CESAREAN SECTION     x3   CHOLECYSTECTOMY N/A 08/11/2016   Procedure: LAPAROSCOPIC CHOLECYSTECTOMY WITH   INTRAOPERATIVE CHOLANGIOGRAM;  Surgeon: Almond Lint, MD;  Location: [facility];  Service: General;  Laterality: N/A;   COLONOSCOPY     Cotton Osteotomy w/ Graft Left 06/18/2009   Excision of Benign Lesion Right 01/30/2013   Rt Plantar   FOOT SURGERY     HAMMER TOE REPAIR Right 02/12/2016   RIGHT #5   Hammertoe Repair Left 06/18/2009   Lt #5   HYSTEROSCOPY N/A 06/20/2017   Procedure: DILATION AND CURETTAGE, HYSTEROSCOPY w/ Polypectomy;  Surgeon: Willodean Rosenthal, MD;  Location: [facility];  Service: Gynecology;  Laterality: N/A;   LUMBAR FUSION  2018   METATARSAL OSTEOTOMY Left 06/18/2009   #  5   MULTIPLE TOOTH EXTRACTIONS     Nail Matrixectomy Left 06/18/2009   LT #1   OSTEOTOMY Right 01/30/2013   Rt #5   Phalangectomy Left 06/18/2009   LT #1   Phalangectomy Right 01/30/2013   Rt #1   TUBAL LIGATION      OB History     Gravida  4   Para  3   Term      Preterm      AB  1   Living  3      SAB      IAB  1   Ectopic      Multiple      Live Births               Home Medications    Prior to Admission medications   Medication Sig Start Date End Date Taking? Authorizing Provider  Accu-Chek FastClix Lancets MISC Use as directed to check blood sugars 2 times per day dx: e11.65 08/31/22   Valetta Fuller, Lynnell Jude,  PA-C  Accu-Chek Softclix Lancets lancets Use as instructed to check blood sugars twice daily E11.69 07/20/21   Arnette Felts, FNP  acetaminophen (TYLENOL) 325 MG tablet Take 1-2 tablets (325-650 mg total) by mouth every 4 (four) hours as needed for mild pain. 08/31/22   Setzer, Lynnell Jude, PA-C  ADVAIR DISKUS 250-50 MCG/ACT AEPB INHALE 1 PUFF INTO THE LUNGS IN THE MORNING AND AT BEDTIME 06/09/23   Martina Sinner, MD  albuterol (VENTOLIN HFA) 108 (90 Base) MCG/ACT inhaler Inhale 2 puffs into the lungs every 6 (six) hours as needed for wheezing or shortness of breath. Patient taking differently: Inhale 2 puffs into the lungs 2 (two) times daily as needed for wheezing or shortness of breath. 06/29/21   Arnette Felts, FNP  alendronate (FOSAMAX) 10 MG tablet Take 1 tablet (10 mg total) by mouth daily before breakfast. Take with a full glass of water on an empty stomach. 11/09/23 11/08/24  Modena Slater, DO  atorvastatin (LIPITOR) 10 MG tablet Take 1 tablet (10 mg total) by mouth daily. 12/09/22 03/09/23  Rema Fendt, NP  Blood Glucose Monitoring Suppl (ACCU-CHEK GUIDE ME) w/Device KIT Use to check blood sugars twice daily E11.69 07/20/21   Arnette Felts, FNP  buPROPion (WELLBUTRIN XL) 300 MG 24 hr tablet Take 1 tablet (300 mg total) by mouth daily. Patient taking differently: Take 300 mg by mouth in the morning and at bedtime. 10/12/21   Arnette Felts, FNP  diclofenac Sodium (VOLTAREN) 1 % GEL Apply 4 g topically 4 (four) times daily. 11/01/23   Modena Slater, DO  DULoxetine (CYMBALTA) 60 MG capsule Take 1 capsule (60 mg total) by mouth daily. 08/23/23 08/22/24  Nooruddin, Jason Fila, MD  empagliflozin (JARDIANCE) 10 MG TABS tablet Take 1 tablet (10 mg total) by mouth daily before breakfast. 11/01/23 01/30/24  Modena Slater, DO  hydrOXYzine (ATARAX) 25 MG tablet Take 1 tablet (25 mg total) by mouth every 8 (eight) hours as needed for anxiety. 07/31/23   Modena Slater, DO  insulin degludec (TRESIBA FLEXTOUCH) 100 UNIT/ML  FlexTouch Pen Inject 10 Units into the skin at bedtime. 11/01/23   Modena Slater, DO  insulin lispro (HUMALOG KWIKPEN) 100 UNIT/ML KwikPen ADMINISTER 5 UNITS UNDER THE SKIN THREE TIMES DAILY WITH MEALS 11/09/23   Modena Slater, DO  Insulin Pen Needle (PEN NEEDLES) 31G X 8 MM MISC UAD 09/15/22   Rema Fendt, NP  Insulin Syringe-Needle U-100 (INSULIN SYRINGE .3CC/30GX5/16") 30G X 5/16"  0.3 ML MISC Use to inject Levemir twice daily. 09/06/22   Hoy Register, MD  Iron, Ferrous Sulfate, 325 (65 Fe) MG TABS Take 325 mg by mouth daily. 06/08/23   Modena Slater, DO  Lancet Device MISC 1 each by Does not apply route in the morning, at noon, and at bedtime. May substitute to any manufacturer covered by patient's insurance. 12/08/22   Rema Fendt, NP  LINZESS 145 MCG CAPS capsule Take 145 mcg by mouth daily before breakfast.  06/25/19   [provider]  lisinopril (ZESTRIL) 20 MG tablet Take 1 tablet (20 mg total) by mouth daily. 04/20/23 04/19/24  Rana Snare, DO  metFORMIN (GLUCOPHAGE-XR) 500 MG 24 hr tablet TAKE 1 TABLET(500 MG) BY MOUTH DAILY WITH BREAKFAST 09/18/23   Modena Slater, DO  Multiple Vitamins-Minerals (MULTIVITAMIN WITH MINERALS) tablet Take 1 tablet by mouth daily. Alive    [provider]  omeprazole (PRILOSEC) 40 MG capsule TAKE 1 CAPSULE(40 MG) BY MOUTH DAILY BEFORE BREAKFAST 09/27/23   Zonia Kief, Amy J, NP  ondansetron (ZOFRAN-ODT) 4 MG disintegrating tablet Take 1 tablet (4 mg total) by mouth every 8 (eight) hours as needed for nausea or vomiting. 02/10/23   Rema Fendt, NP  [facility] ULTRA test strip USE AS DIRECTED 10/31/23   Modena Slater, DO  tirzepatide [facility]) 5 MG/0.5ML Pen Inject 5 mg into the skin once a week. 11/01/23 01/30/24  Modena Slater, DO  traZODone (DESYREL) 50 MG tablet Take 1 tablet (50 mg total) by mouth at bedtime as needed for sleep. 11/08/23 02/06/24  Modena Slater, DO    Family History Family History  Problem Relation Age of Onset   Heart disease Mother     Diabetes Other        mat great aunt   Cirrhosis Other        mat great aunt   Cirrhosis Other        mat great uncles x 2   Colon cancer Neg Hx    Breast cancer Neg Hx     Social History Social History   Tobacco Use   Smoking status: Former    Current packs/day: 0.00    Average packs/day: 0.8 packs/day for 47.0 years (35.3 ttl pk-yrs)    Types: Cigarettes    Start date: 10    Quit date: 2020    Years since quitting: 5.0    Passive exposure: Past   Smokeless tobacco: Never  Vaping Use   Vaping status: Never Used  Substance Use Topics   Alcohol use: No    Alcohol/week: 0.0 standard drinks of alcohol    Comment: in rehab   Drug use: No    Comment: in rehab     Allergies   Patient has no known allergies.   Review of Systems Review of Systems  Neurological:  Positive for facial asymmetry, speech difficulty, weakness and headaches. Negative for numbness.     Physical Exam Triage Vital Signs ED Triage Vitals [11/10/23 1723]  Encounter Vitals Group     BP (!) 144/80     Systolic BP Percentile      Diastolic BP Percentile      Pulse Rate 81     Resp      Temp (!) 97.3 F (36.3 C)     Temp Source Oral     SpO2 96 %     Weight      Height      Head Circumference      Peak  Flow      Pain Score 7     Pain Loc      Pain Education      Exclude from Growth Chart    No data found.  Updated Vital Signs BP (!) 144/80   Pulse 81   Temp (!) 97.3 F (36.3 C) (Oral)   SpO2 96%   Visual Acuity Right Eye Distance:   Left Eye Distance:   Bilateral Distance:    Right Eye Near:   Left Eye Near:    Bilateral Near:     Physical Exam Vitals and nursing note reviewed.  Constitutional:      General: She is awake. She is not in acute distress.    Appearance: Normal appearance. She is well-developed and well-groomed. She is not ill-appearing.  Eyes:     Extraocular Movements: Extraocular movements intact.     Pupils: Pupils are equal, round, and reactive  to light.  Cardiovascular:     Rate and Rhythm: Normal rate and regular rhythm.  Pulmonary:     Effort: Pulmonary effort is normal.     Breath sounds: Normal breath sounds.  Musculoskeletal:     Cervical back: Normal range of motion and neck supple.  Skin:    General: Skin is warm and dry.  Neurological:     Mental Status: She is alert and oriented to person, place, and time. Mental status is at baseline.     GCS: GCS eye subscore is 4. GCS verbal subscore is 5. GCS motor subscore is 6.     Cranial Nerves: Facial asymmetry present.     Sensory: Sensation is intact.     Motor: Weakness present.     Coordination: Coordination is intact.     Gait: Gait is intact.  Psychiatric:        Attention and Perception: Attention and perception normal.        Mood and Affect: Mood and affect normal.        Speech: Speech is slurred.        Behavior: Behavior normal. Behavior is cooperative.        Thought Content: Thought content normal.        Cognition and Memory: Cognition and memory normal.        Judgment: Judgment normal.      UC Treatments / Results  Labs (all labs ordered are listed, but only abnormal results are displayed) Labs Reviewed - No data to display  EKG   Radiology No results found.  Procedures Procedures (including critical care time)  Medications Ordered in UC Medications - No data to display  Initial Impression / Assessment and Plan / UC Course  I have reviewed the triage vital signs and the nursing notes.  Pertinent labs & imaging results that were available during my care of the patient were reviewed by me and considered in my medical decision making (see chart for details).     Patient presented with daughter for facial droop that began yesterday around 1 PM.  Patient also endorses headache and left-sided weakness that she states has been going on for "awhile".  Patient was unable to determine exactly when the symptoms began.  History of  hypertension.  Upon assessment patient has slight facial droop to the left side of her face.  Patient also has notably weaker grips and strength on the left side.  Denies any loss of sensation, numbness, and tingling.  Daughter states that speech sounds slightly slurred compared to normal.  Patient is somewhat slow to respond and is a little confused when answering questions about recent symptoms.  Daughter reports that patient is A&O to baseline.  Patient is mildly hypertensive at 144/80.  Discussed with patient and daughter that patient would benefit from further evaluation and treatment in the emergency department due to new onset of neurological symptoms.  Patient and daughter agreeable to plan at this time patient is stable enough at this time to arrived to ER via POV.  Final diagnoses:  Facial droop  Weakness     Discharge Instructions      Go to ER for further evaluation and treatment.      ED Prescriptions   None    PDMP not reviewed this encounter.   Wynonia Lawman A, NP 11/10/23 [phone]

## 2023-11-13 ENCOUNTER — Other Ambulatory Visit: Payer: Self-pay | Admitting: Student

## 2023-11-13 ENCOUNTER — Telehealth: Payer: Self-pay | Admitting: *Deleted

## 2023-11-13 LAB — PATHOLOGIST SMEAR REVIEW

## 2023-11-13 NOTE — Telephone Encounter (Signed)
Patient brought in [facility] form for completion / patient was seen 11-01-2023. Spoke with patient ,says she  needs someone to cook for her so she needs more hours for the aid to be there . At present patient  is receiving 80 hours (per 2 [facility] pcs )of PCS. If patient needs more hours she will have to come in to be assessed.

## 2023-11-13 NOTE — Telephone Encounter (Signed)
Called patient unable to leave voice mail -mail box full. Was calling to let patient know that PCS form has been faxed and she would be contacted by medicaid to set up assessment appointment,

## 2023-11-13 NOTE — Telephone Encounter (Signed)
PCS form faxed to [facility] # [phone] - sent via [facility] for in home assessment appointment,

## 2023-11-14 NOTE — Telephone Encounter (Signed)
Patient sent message via my chart to contact our office to schedule an appointment. 

## 2023-11-21 ENCOUNTER — Encounter: Payer: 59 | Admitting: Student

## 2023-11-22 ENCOUNTER — Ambulatory Visit: Payer: 59 | Admitting: Student

## 2023-11-22 ENCOUNTER — Telehealth: Payer: Self-pay | Admitting: Student

## 2023-11-22 VITALS — BP 150/82 | HR 74 | Temp 97.5°F | Ht 60.0 in | Wt 218.9 lb

## 2023-11-22 DIAGNOSIS — I10 Essential (primary) hypertension: Secondary | ICD-10-CM

## 2023-11-22 DIAGNOSIS — Z1231 Encounter for screening mammogram for malignant neoplasm of breast: Secondary | ICD-10-CM

## 2023-11-22 DIAGNOSIS — Z794 Long term (current) use of insulin: Secondary | ICD-10-CM | POA: Diagnosis not present

## 2023-11-22 DIAGNOSIS — Z7985 Long-term (current) use of injectable non-insulin antidiabetic drugs: Secondary | ICD-10-CM | POA: Diagnosis not present

## 2023-11-22 DIAGNOSIS — G4700 Insomnia, unspecified: Secondary | ICD-10-CM | POA: Insufficient documentation

## 2023-11-22 DIAGNOSIS — E1165 Type 2 diabetes mellitus with hyperglycemia: Secondary | ICD-10-CM

## 2023-11-22 DIAGNOSIS — Z7984 Long term (current) use of oral hypoglycemic drugs: Secondary | ICD-10-CM | POA: Diagnosis not present

## 2023-11-22 MED ORDER — LOSARTAN POTASSIUM-HCTZ 100-25 MG PO TABS: 1.0000 | ORAL_TABLET | Freq: Every day | ORAL | 3 refills | Status: DC

## 2023-11-22 MED ORDER — METFORMIN HCL ER 500 MG PO TB24: 1000.0000 mg | ORAL_TABLET | Freq: Every day | ORAL | 3 refills | Status: DC

## 2023-11-22 MED ORDER — TIZANIDINE HCL 4 MG PO TABS: 4.0000 mg | ORAL_TABLET | Freq: Three times a day (TID) | ORAL | 1 refills | Status: AC

## 2023-11-22 MED ORDER — TRAZODONE HCL 50 MG PO TABS: 50.0000 mg | ORAL_TABLET | Freq: Every evening | ORAL | 0 refills | Status: DC | PRN

## 2023-11-22 NOTE — Progress Notes (Deleted)
CC: Medication refill  HPI:  [name] is a 68 y.o. female living with a history stated below and presents today for medication refill. Please see problem based assessment and plan for additional details.  Past Medical History:  Diagnosis Date   Anemia    Anxiety    Arthritis    Asthma    Bacterial vaginosis 05/16/2022   Bunion    Callus    Candida vaginitis 09/16/2022   Cholelithiasis without obstruction 04/11/2022   Chronic pain    Cocaine abuse (HCC)    COPD (chronic obstructive pulmonary disease) (HCC)    Corns and callosities    Degenerative joint disease    Depression    Diabetes mellitus    Endometrial polyp    ETOH abuse    Gall stones    Gallbladder calculus with acute cholecystitis and no obstruction 04/11/2022   GERD (gastroesophageal reflux disease)    Headache    history of Migraines   Hepatitis C    Hep C   History of adenomatous polyp of colon 04/11/2022   History of endometrial ablation 02/06/2013   Hyperlipidemia    Hypertension    Insomnia    Spondylolisthesis of lumbar region    Substance abuse (HCC)    alcoholism   Tuberculosis 1985   Wears dentures    Wears glasses     Current Outpatient Medications on File Prior to Visit  Medication Sig Dispense Refill   Accu-Chek FastClix Lancets MISC Use as directed to check blood sugars 2 times per day dx: e11.65 102 each prn   Accu-Chek Softclix Lancets lancets Use as instructed to check blood sugars twice daily E11.69 100 each 2   acetaminophen (TYLENOL) 325 MG tablet Take 1-2 tablets (325-650 mg total) by mouth every 4 (four) hours as needed for mild pain.     ADVAIR DISKUS 250-50 MCG/ACT AEPB INHALE 1 PUFF INTO THE LUNGS IN THE MORNING AND AT BEDTIME 60 each 6   albuterol (VENTOLIN HFA) 108 (90 Base) MCG/ACT inhaler Inhale 2 puffs into the lungs every 6 (six) hours as needed for wheezing or shortness of breath. (Patient taking differently: Inhale 2 puffs into the lungs 2 (two) times  daily as needed for wheezing or shortness of breath.) 18 g 2   alendronate (FOSAMAX) 10 MG tablet Take 1 tablet (10 mg total) by mouth daily before breakfast. Take with a full glass of water on an empty stomach. 30 tablet 11   atorvastatin (LIPITOR) 10 MG tablet Take 1 tablet (10 mg total) by mouth daily. 30 tablet 2   Blood Glucose Monitoring Suppl (ACCU-CHEK GUIDE ME) w/Device KIT Use to check blood sugars twice daily E11.69 1 kit 1   buPROPion (WELLBUTRIN XL) 300 MG 24 hr tablet Take 1 tablet (300 mg total) by mouth daily. (Patient taking differently: Take 300 mg by mouth in the morning and at bedtime.) 90 tablet 0   diclofenac Sodium (VOLTAREN) 1 % GEL Apply 4 g topically 4 (four) times daily. 100 g 2   DULoxetine (CYMBALTA) 60 MG capsule Take 1 capsule (60 mg total) by mouth daily. 90 capsule 3   empagliflozin (JARDIANCE) 10 MG TABS tablet Take 1 tablet (10 mg total) by mouth daily before breakfast. 30 tablet 2   hydrOXYzine (ATARAX) 25 MG tablet Take 1 tablet (25 mg total) by mouth every 8 (eight) hours as needed for anxiety. 30 tablet 1   insulin degludec (TRESIBA FLEXTOUCH) 100 UNIT/ML FlexTouch Pen Inject 10 Units  into the skin at bedtime.     insulin lispro (HUMALOG KWIKPEN) 100 UNIT/ML KwikPen ADMINISTER 5 UNITS UNDER THE SKIN THREE TIMES DAILY WITH MEALS 3 mL 3   Insulin Pen Needle (PEN NEEDLES) 31G X 8 MM MISC UAD 100 each 0   Insulin Syringe-Needle U-100 (INSULIN SYRINGE .3CC/30GX5/16") 30G X 5/16" 0.3 ML MISC Use to inject Levemir twice daily. 100 each 2   Iron, Ferrous Sulfate, 325 (65 Fe) MG TABS Take 325 mg by mouth daily. 30 tablet 2   Lancet Device MISC 1 each by Does not apply route in the morning, at noon, and at bedtime. May substitute to any manufacturer covered by patient's insurance. 100 each 0   LINZESS 145 MCG CAPS capsule Take 145 mcg by mouth daily before breakfast.      lisinopril (ZESTRIL) 20 MG tablet Take 1 tablet (20 mg total) by mouth daily. 30 tablet 11    metFORMIN (GLUCOPHAGE-XR) 500 MG 24 hr tablet TAKE 1 TABLET(500 MG) BY MOUTH DAILY WITH BREAKFAST 90 tablet 0   Multiple Vitamins-Minerals (MULTIVITAMIN WITH MINERALS) tablet Take 1 tablet by mouth daily. Alive     omeprazole (PRILOSEC) 40 MG capsule TAKE 1 CAPSULE(40 MG) BY MOUTH DAILY BEFORE BREAKFAST 90 capsule 0   ondansetron (ZOFRAN-ODT) 4 MG disintegrating tablet Take 1 tablet (4 mg total) by mouth every 8 (eight) hours as needed for nausea or vomiting. 30 tablet 1   ONETOUCH ULTRA test strip USE AS DIRECTED 100 strip 0   tirzepatide (MOUNJARO) 5 MG/0.5ML Pen Inject 5 mg into the skin once a week. 2 mL 2   traZODone (DESYREL) 50 MG tablet Take 1 tablet (50 mg total) by mouth at bedtime as needed for sleep. 90 tablet 0   No current facility-administered medications on file prior to visit.    Family History  Problem Relation Age of Onset   Heart disease Mother    Diabetes Other        mat great aunt   Cirrhosis Other        mat great aunt   Cirrhosis Other        mat great uncles x 2   Colon cancer Neg Hx    Breast cancer Neg Hx     Social History   Socioeconomic History   Marital status: Single    Spouse name: Not on file   Number of children: 3   Years of education: Not on file   Highest education level: Not on file  Occupational History   Occupation: disablily  Tobacco Use   Smoking status: Former    Current packs/day: 0.00    Average packs/day: 0.8 packs/day for 47.0 years (35.3 ttl pk-yrs)    Types: Cigarettes    Start date: 89    Quit date: 2020    Years since quitting: 5.1    Passive exposure: Past   Smokeless tobacco: Never  Vaping Use   Vaping status: Never Used  Substance and Sexual Activity   Alcohol use: No    Alcohol/week: 0.0 standard drinks of alcohol    Comment: in rehab   Drug use: No    Comment: in rehab   Sexual activity: Not Currently    Birth control/protection: Post-menopausal  Other Topics Concern   Not on file  Social History  Narrative   Not on file   Social Drivers of Health   Financial Resource Strain: Low Risk  (04/20/2023)   Overall Financial Resource Strain (CARDIA)    Difficulty  of Paying Living Expenses: Not hard at all  Food Insecurity: No Food Insecurity (04/20/2023)   Hunger Vital Sign    Worried About Running Out of Food in the Last Year: Never true    Ran Out of Food in the Last Year: Never true  Transportation Needs: No Transportation Needs (04/20/2023)   PRAPARE - Administrator, Civil Service (Medical): No    Lack of Transportation (Non-Medical): No  Physical Activity: Sufficiently Active (04/20/2023)   Exercise Vital Sign    Days of Exercise per Week: 3 days    Minutes of Exercise per Session: 60 min  Stress: No Stress Concern Present (04/20/2023)   [employer] of Occupational Health - Occupational Stress Questionnaire    Feeling of Stress : Only a little  Social Connections: Moderately Integrated (04/20/2023)   Social Connection and Isolation Panel [NHANES]    Frequency of Communication with Friends and Family: More than three times a week    Frequency of Social Gatherings with Friends and Family: More than three times a week    Attends Religious Services: More than 4 times per year    Active Member of [employer] or Organizations: Yes    Attends Banker Meetings: More than 4 times per year    Marital Status: Widowed  Intimate Partner Violence: Not At Risk (04/20/2023)   Humiliation, Afraid, Rape, and Kick questionnaire    Fear of Current or Ex-Partner: No    Emotionally Abused: No    Physically Abused: No    Sexually Abused: No    Review of Systems: ROS negative except for what is noted on the assessment and plan.  Vitals:   11/22/23 1423 11/22/23 1445  BP: (!) 154/84 (!) 150/82  Pulse: 77 74  Temp: (!) 97.5 F (36.4 C)   TempSrc: Oral   SpO2: 100%   Weight: 218 lb 14.4 oz (99.3 kg)   Height: 5' (1.524 m)     Physical Exam: Constitutional:  well-appearing *** sitting in ***, in no acute distress HENT: normocephalic atraumatic, mucous membranes moist Eyes: conjunctiva non-erythematous Cardiovascular: regular rate and rhythm, no m/r/g Pulmonary/Chest: normal work of breathing on room air, lungs clear to auscultation bilaterally Abdominal: soft, non-tender, non-distended MSK: normal bulk and tone Neurological: alert & oriented x 3, no focal deficit Skin: warm and dry Psych: normal mood and behavior  Assessment & Plan:   No problem-specific Assessment & Plan notes found for this encounter.     Patient {GC/GE:[id number]::"discussed with","seen with"} Dr. {WUJWJ:[id number]::"NFAOZHYQ","M. Hoffman","Mullen","Narendra","Vincent","Guilloud","Lau","Machen"}    Kathleen Lime, M.D [facility] Phone: [phone] Date 11/22/2023 Time 3:29 PM

## 2023-11-22 NOTE — Patient Instructions (Addendum)
Thank you, [name] for allowing [place] to provide your care today. Today we discussed discussion chronic conditions and  medication refill.  I have refilled all of your medications for you.  Your blood sugar levels are somewhat unchanged.  Continue taking all of her medications as prescribed.  I have ordered the following labs for you:  Lab Orders         Basic metabolic panel       Tests ordered today:    Referrals ordered today:   Referral Orders  No referral(s) requested today     I have ordered the following medication/changed the following medications:   Stop the following medications: Medications Discontinued During This Encounter  Medication Reason   lisinopril (ZESTRIL) 20 MG tablet Change in therapy   metFORMIN (GLUCOPHAGE-XR) 500 MG 24 hr tablet Reorder   traZODone (DESYREL) 50 MG tablet Reorder     Start the following medications: Meds ordered this encounter  Medications   traZODone (DESYREL) 50 MG tablet    Sig: Take 1 tablet (50 mg total) by mouth at bedtime as needed for sleep.    Dispense:  90 tablet    Refill:  0   metFORMIN (GLUCOPHAGE-XR) 500 MG 24 hr tablet    Sig: Take 2 tablets (1,000 mg total) by mouth daily with breakfast.    Dispense:  90 tablet    Refill:  3   losartan-hydrochlorothiazide (HYZAAR) 100-25 MG tablet    Sig: Take 1 tablet by mouth daily.    Dispense:  90 tablet    Refill:  3     Follow up: 3-4 months   Remember:   Should you have any questions or concerns please call the internal medicine clinic at [phone].    Kathleen Lime, M.D [facility]

## 2023-11-22 NOTE — Progress Notes (Unsigned)
CC: Medication Refill  HPI:  [name] is a 68 y.o. female living with a history stated below and presents today for medication refill. Please see problem based assessment and plan for additional details.  Past Medical History:  Diagnosis Date   Anemia    Anxiety    Arthritis    Asthma    Bacterial vaginosis 05/16/2022   Bunion    Callus    Candida vaginitis 09/16/2022   Cholelithiasis without obstruction 04/11/2022   Chronic pain    Cocaine abuse (HCC)    COPD (chronic obstructive pulmonary disease) (HCC)    Corns and callosities    Degenerative joint disease    Depression    Diabetes mellitus    Endometrial polyp    ETOH abuse    Gall stones    Gallbladder calculus with acute cholecystitis and no obstruction 04/11/2022   GERD (gastroesophageal reflux disease)    Headache    history of Migraines   Hepatitis C    Hep C   History of adenomatous polyp of colon 04/11/2022   History of endometrial ablation 02/06/2013   Hyperlipidemia    Hypertension    Insomnia    Spondylolisthesis of lumbar region    Substance abuse (HCC)    alcoholism   Tuberculosis 1985   Wears dentures    Wears glasses     Current Outpatient Medications on File Prior to Visit  Medication Sig Dispense Refill   Accu-Chek FastClix Lancets MISC Use as directed to check blood sugars 2 times per day dx: e11.65 102 each prn   Accu-Chek Softclix Lancets lancets Use as instructed to check blood sugars twice daily E11.69 100 each 2   acetaminophen (TYLENOL) 325 MG tablet Take 1-2 tablets (325-650 mg total) by mouth every 4 (four) hours as needed for mild pain.     ADVAIR DISKUS 250-50 MCG/ACT AEPB INHALE 1 PUFF INTO THE LUNGS IN THE MORNING AND AT BEDTIME 60 each 6   albuterol (VENTOLIN HFA) 108 (90 Base) MCG/ACT inhaler Inhale 2 puffs into the lungs every 6 (six) hours as needed for wheezing or shortness of breath. (Patient taking differently: Inhale 2 puffs into the lungs 2 (two) times daily  as needed for wheezing or shortness of breath.) 18 g 2   alendronate (FOSAMAX) 10 MG tablet Take 1 tablet (10 mg total) by mouth daily before breakfast. Take with a full glass of water on an empty stomach. 30 tablet 11   atorvastatin (LIPITOR) 10 MG tablet Take 1 tablet (10 mg total) by mouth daily. 30 tablet 2   Blood Glucose Monitoring Suppl (ACCU-CHEK GUIDE ME) w/Device KIT Use to check blood sugars twice daily E11.69 1 kit 1   buPROPion (WELLBUTRIN XL) 300 MG 24 hr tablet Take 1 tablet (300 mg total) by mouth daily. (Patient taking differently: Take 300 mg by mouth in the morning and at bedtime.) 90 tablet 0   diclofenac Sodium (VOLTAREN) 1 % GEL Apply 4 g topically 4 (four) times daily. 100 g 2   DULoxetine (CYMBALTA) 60 MG capsule Take 1 capsule (60 mg total) by mouth daily. 90 capsule 3   empagliflozin (JARDIANCE) 10 MG TABS tablet Take 1 tablet (10 mg total) by mouth daily before breakfast. 30 tablet 2   hydrOXYzine (ATARAX) 25 MG tablet Take 1 tablet (25 mg total) by mouth every 8 (eight) hours as needed for anxiety. 30 tablet 1   insulin degludec (TRESIBA FLEXTOUCH) 100 UNIT/ML FlexTouch Pen Inject 10 Units  into the skin at bedtime.     insulin lispro (HUMALOG KWIKPEN) 100 UNIT/ML KwikPen ADMINISTER 5 UNITS UNDER THE SKIN THREE TIMES DAILY WITH MEALS 3 mL 3   Insulin Pen Needle (PEN NEEDLES) 31G X 8 MM MISC UAD 100 each 0   Insulin Syringe-Needle U-100 (INSULIN SYRINGE .3CC/30GX5/16") 30G X 5/16" 0.3 ML MISC Use to inject Levemir twice daily. 100 each 2   Iron, Ferrous Sulfate, 325 (65 Fe) MG TABS Take 325 mg by mouth daily. 30 tablet 2   Lancet Device MISC 1 each by Does not apply route in the morning, at noon, and at bedtime. May substitute to any manufacturer covered by patient's insurance. 100 each 0   LINZESS 145 MCG CAPS capsule Take 145 mcg by mouth daily before breakfast.      Multiple Vitamins-Minerals (MULTIVITAMIN WITH MINERALS) tablet Take 1 tablet by mouth daily. Alive      omeprazole (PRILOSEC) 40 MG capsule TAKE 1 CAPSULE(40 MG) BY MOUTH DAILY BEFORE BREAKFAST 90 capsule 0   ondansetron (ZOFRAN-ODT) 4 MG disintegrating tablet Take 1 tablet (4 mg total) by mouth every 8 (eight) hours as needed for nausea or vomiting. 30 tablet 1   ONETOUCH ULTRA test strip USE AS DIRECTED 100 strip 0   tirzepatide (MOUNJARO) 5 MG/0.5ML Pen Inject 5 mg into the skin once a week. 2 mL 2   No current facility-administered medications on file prior to visit.    Family History  Problem Relation Age of Onset   Heart disease Mother    Diabetes Other        mat great aunt   Cirrhosis Other        mat great aunt   Cirrhosis Other        mat great uncles x 2   Colon cancer Neg Hx    Breast cancer Neg Hx     Social History   Socioeconomic History   Marital status: Single    Spouse name: Not on file   Number of children: 3   Years of education: Not on file   Highest education level: Not on file  Occupational History   Occupation: disablily  Tobacco Use   Smoking status: Former    Current packs/day: 0.00    Average packs/day: 0.8 packs/day for 47.0 years (35.3 ttl pk-yrs)    Types: Cigarettes    Start date: 61    Quit date: 2020    Years since quitting: 5.1    Passive exposure: Past   Smokeless tobacco: Never  Vaping Use   Vaping status: Never Used  Substance and Sexual Activity   Alcohol use: No    Alcohol/week: 0.0 standard drinks of alcohol    Comment: in rehab   Drug use: No    Comment: in rehab   Sexual activity: Not Currently    Birth control/protection: Post-menopausal  Other Topics Concern   Not on file  Social History Narrative   Not on file   Social Drivers of Health   Financial Resource Strain: Low Risk  (04/20/2023)   Overall Financial Resource Strain (CARDIA)    Difficulty of Paying Living Expenses: Not hard at all  Food Insecurity: No Food Insecurity (04/20/2023)   Hunger Vital Sign    Worried About Running Out of Food in the Last Year:  Never true    Ran Out of Food in the Last Year: Never true  Transportation Needs: No Transportation Needs (04/20/2023)   PRAPARE - Transportation    Lack  of Transportation (Medical): No    Lack of Transportation (Non-Medical): No  Physical Activity: Sufficiently Active (04/20/2023)   Exercise Vital Sign    Days of Exercise per Week: 3 days    Minutes of Exercise per Session: 60 min  Stress: No Stress Concern Present (04/20/2023)   [employer] of Occupational Health - Occupational Stress Questionnaire    Feeling of Stress : Only a little  Social Connections: Moderately Integrated (04/20/2023)   Social Connection and Isolation Panel [NHANES]    Frequency of Communication with Friends and Family: More than three times a week    Frequency of Social Gatherings with Friends and Family: More than three times a week    Attends Religious Services: More than 4 times per year    Active Member of [employer] or Organizations: Yes    Attends Banker Meetings: More than 4 times per year    Marital Status: Widowed  Intimate Partner Violence: Not At Risk (04/20/2023)   Humiliation, Afraid, Rape, and Kick questionnaire    Fear of Current or Ex-Partner: No    Emotionally Abused: No    Physically Abused: No    Sexually Abused: No    Review of Systems: ROS negative except for what is noted on the assessment and plan.  Vitals:   11/22/23 1423 11/22/23 1445  BP: (!) 154/84 (!) 150/82  Pulse: 77 74  Temp: (!) 97.5 F (36.4 C)   TempSrc: Oral   SpO2: 100%   Weight: 218 lb 14.4 oz (99.3 kg)   Height: 5' (1.524 m)     Physical Exam: Constitutional: well-appearing woman, sitting in chair , in no acute distress Cardiovascular: regular rate and rhythm, no m/r/g Pulmonary/Chest: normal work of breathing on room air, lungs clear to auscultation bilaterally MSK: normal bulk and tone Neurological: alert & oriented x 3, no focal deficit Skin: warm and dry Psych: normal mood and  behavior  Assessment & Plan:   Essential hypertension BP Readings from Last 3 Encounters:  11/22/23 (!) 150/82  11/10/23 (!) 143/72  11/01/23 (!) 158/82  Hx of HTN currently on Lisinopril 20 mg. BP continues to be resistant at stage 2. Due to her diabetes and BMI, I worry that this patient may suffer a CVA and will benefit from a tight blood pressure control at this time. Patient was recently seen in the ED  for facial palsy secondary to Stroke but all imaging workup was unrevealing. Patient left without being seen after the imaging studies. I wonder if she had a TIA. She reports she left because it was taking too long to be re-evaluated.I will switch her BP regimen to a dual combo therapy. BMP and medications reviewed.  - Start Hyzaar 100-25mg  - Will repeat BMP - Follow up in 3 months   Uncontrolled type 2 diabetes mellitus with hyperglycemia (HCC) Hx of Diabetes on Metformin, Tresiba,Humalog,Mounjaro. Reports good adherence to these medications.           Component Ref Range & Units (hover) 3 wk ago (11/01/23) 5 mo ago (06/08/23) 8 mo ago (03/01/23) 1 yr ago (08/20/22) 1 yr ago (12/28/21) 2 yr ago (10/19/21) 2 yr ago (06/29/21)  Hemoglobin A1C 7.4 Abnormal  8.2 Abnormal  6.6 Abnormal  7.4 High  R, CM 7.2 Abnormal  6.9 High  R, CM 6.9 High  R, CM    Hgb A1c at 7.4 2 weeks ago. Patient reports good tolerance to her Greggory Keen and has no concerns at this time.  Plan -  Continue metformin 1000 mg twice daily -Continue Tresiba 10 units daily -Continue Humalog 5 units 3 times daily -Increase Mounjaro to 5 mg weekly - Continue Jardiance 10 mg daily   Patient discussed with Dr. Lavonna Monarch, M.D [facility] Phone: [phone] Date 11/23/2023 Time 12:01 AM

## 2023-11-22 NOTE — Telephone Encounter (Signed)
[facility] will need a new order placed for this patient as she is due for her annual mammogram.  The pt's last Mammogram was as follows:  MM 3D SCREEN BREAST BILATERAL (Accession [id number]) (Order [id number]) Imaging Date: 01/05/2023 Department: [facility]    Can a new order be placed as she is sch for an appointment today with [facility]  and would like to be sch as soon as possible?

## 2023-11-22 NOTE — Assessment & Plan Note (Signed)
BP Readings from Last 3 Encounters:  11/22/23 (!) 150/82  11/10/23 (!) 143/72  11/01/23 (!) 158/82  Hx of HTN currently on Lisinopril 20 mg. BP continues to be resistant at stage 2. Due to her diabetes and BMI, I worry that this patient may suffer a CVA and will benefit from a tight blood pressure control at this time. Patient was recently seen in the ED  for facial palsy secondary to Stroke but all imaging workup was unrevealing. Patient left without being seen after the imaging studies. I wonder if she had a TIA. She reports she left because it was taking too long to be re-evaluated.I will switch her BP regimen to a dual combo therapy. BMP and medications reviewed.  - Start Hyzaar 100-25mg  - Will repeat BMP - Follow up in 3 months

## 2023-11-23 ENCOUNTER — Encounter: Payer: Self-pay | Admitting: Student

## 2023-11-23 NOTE — Assessment & Plan Note (Signed)
Hx of Diabetes on Metformin, Tresiba,Humalog,Mounjaro. Reports good adherence to these medications.           Component Ref Range & Units (hover) 3 wk ago (11/01/23) 5 mo ago (06/08/23) 8 mo ago (03/01/23) 1 yr ago (08/20/22) 1 yr ago (12/28/21) 2 yr ago (10/19/21) 2 yr ago (06/29/21)  Hemoglobin A1C 7.4 Abnormal  8.2 Abnormal  6.6 Abnormal  7.4 High  R, CM 7.2 Abnormal  6.9 High  R, CM 6.9 High  R, CM    Hgb A1c at 7.4 2 weeks ago. Patient reports good tolerance to her Greggory Keen and has no concerns at this time.  Plan -Continue metformin 1000 mg twice daily -Continue Tresiba 10 units daily -Continue Humalog 5 units 3 times daily -Continue Mounjaro to 5 mg weekly -Continue Jardiance 10 mg daily

## 2023-11-30 NOTE — Progress Notes (Signed)
Internal Medicine Clinic Attending  Case discussed with the resident at the time of the visit.  We reviewed the resident's history and exam and pertinent patient test results.  I agree with the assessment, diagnosis, and plan of care documented in the resident's note. We discussed possibility of a TIA at the time of patient's ED visit and that a carotid doppler would be next step in evaluation.

## 2023-12-04 ENCOUNTER — Other Ambulatory Visit: Payer: Self-pay | Admitting: Dietician

## 2023-12-04 DIAGNOSIS — E1165 Type 2 diabetes mellitus with hyperglycemia: Secondary | ICD-10-CM

## 2023-12-04 NOTE — Telephone Encounter (Signed)
 Lost her meter. Needs a new one sent to [facility] [place] on [address]

## 2023-12-05 ENCOUNTER — Other Ambulatory Visit: Payer: Self-pay | Admitting: Student

## 2023-12-05 DIAGNOSIS — K219 Gastro-esophageal reflux disease without esophagitis: Secondary | ICD-10-CM

## 2023-12-05 MED ORDER — OMEPRAZOLE 40 MG PO CPDR: DELAYED_RELEASE_CAPSULE | ORAL | 0 refills | Status: DC

## 2023-12-05 MED ORDER — ONETOUCH VERIO REFLECT W/DEVICE KIT
PACK | 0 refills | Status: AC
Start: 2023-12-05 — End: ?

## 2023-12-05 NOTE — Telephone Encounter (Signed)
 Patient notified

## 2023-12-05 NOTE — Telephone Encounter (Signed)
  omeprazole (PRILOSEC) 40 MG capsule  [id number] - [place], [place] - [address]

## 2023-12-07 ENCOUNTER — Other Ambulatory Visit: Payer: Self-pay

## 2023-12-07 DIAGNOSIS — K219 Gastro-esophageal reflux disease without esophagitis: Secondary | ICD-10-CM

## 2023-12-07 MED ORDER — OMEPRAZOLE 40 MG PO CPDR: DELAYED_RELEASE_CAPSULE | ORAL | 0 refills | Status: DC

## 2023-12-14 ENCOUNTER — Telehealth: Payer: Self-pay | Admitting: *Deleted

## 2023-12-14 ENCOUNTER — Other Ambulatory Visit: Payer: Self-pay | Admitting: Student

## 2023-12-14 DIAGNOSIS — E1165 Type 2 diabetes mellitus with hyperglycemia: Secondary | ICD-10-CM

## 2023-12-14 NOTE — Telephone Encounter (Signed)
 Copied from CRM [id number]. Topic: Clinical - Medication Question >> Dec 14, 2023  4:16 PM Alcus Dad H wrote: Reason for CRM: Patient wants to know if the doctor can prescribe her something else for her muscle spasms because tizandidine 4mg  isn't work, says she wants to try methocarbamol

## 2023-12-14 NOTE — Telephone Encounter (Signed)
 Medication last refilled on 11/22/23 with 3 refills.

## 2023-12-15 ENCOUNTER — Encounter: Payer: Self-pay | Admitting: Pulmonary Disease

## 2023-12-15 ENCOUNTER — Ambulatory Visit: Payer: 59 | Admitting: Pulmonary Disease

## 2023-12-15 VITALS — BP 127/80 | HR 71 | Ht 59.0 in | Wt 223.0 lb

## 2023-12-15 DIAGNOSIS — R0982 Postnasal drip: Secondary | ICD-10-CM

## 2023-12-15 DIAGNOSIS — J4489 Other specified chronic obstructive pulmonary disease: Secondary | ICD-10-CM | POA: Diagnosis not present

## 2023-12-15 DIAGNOSIS — R053 Chronic cough: Secondary | ICD-10-CM

## 2023-12-15 DIAGNOSIS — K219 Gastro-esophageal reflux disease without esophagitis: Secondary | ICD-10-CM

## 2023-12-15 DIAGNOSIS — J4540 Moderate persistent asthma, uncomplicated: Secondary | ICD-10-CM

## 2023-12-15 DIAGNOSIS — R0602 Shortness of breath: Secondary | ICD-10-CM

## 2023-12-15 MED ORDER — ALBUTEROL SULFATE HFA 108 (90 BASE) MCG/ACT IN AERS: 1.0000 | INHALATION_SPRAY | Freq: Four times a day (QID) | RESPIRATORY_TRACT | 2 refills | Status: AC | PRN

## 2023-12-15 MED ORDER — FLUTICASONE PROPIONATE 50 MCG/ACT NA SUSP: 1.0000 | Freq: Every day | NASAL | 2 refills | Status: DC

## 2023-12-15 NOTE — Progress Notes (Signed)
 Synopsis: Referred in May 2023 for asthma by Ricky Stabs, NP  Subjective:   PATIENT ID: Kara Kelley GENDER: female DOB: Jul 03, 1956, MRN: [id number]  HPI  Chief Complaint  Patient presents with   Follow-up    Pt states she's been coughing but advair has helped alot   Kara Kelley is a 68 year old woman, former smoker with history of DMII and GERD who returns to pulmonary clinic for asthma.   She reports she is coughing less since last visit with starting fluticasone nasal spray and elevating the head of her bed for GERD management.   PFTs today show non-specific pulmonary function pattern.   Initial OV 02/23/22 She reports having shortness of breath, intermittent cough and wheezing for many years.  She is coughing up small amounts of clear phlegm.  She has increased coughing episodes at nighttime.  She continues to experience intermittent wheezing.  Triggers that bring on her symptoms include cigarette smoke exposure, hot humid days, strong perfumes/colognes or strong cleaning agents.  Spring allergies can also bother her breathing and lead to nasal congestion and drainage.  She is not currently taking any allergy medicines.  She can experience some nighttime awakenings due to cough but does not experience wheezing at that time.  She is currently taking lisinopril.  She has history of GERD and is currently taking omeprazole daily which controls her reflux well.  She uses as needed albuterol with little relief in her symptoms.  She is not currently on any maintenance inhaler.  She is a former smoker and quit 3 years ago.  She has a greater than 20-pack-year smoking history as she smoked from age [age], half a pack per day.  He reports being treated for tuberculosis in 1985 when she was living in [place].   OV 12/15/23 She has been experiencing a dry cough that had previously resolved but has returned over the past two weeks. The cough is sometimes accompanied by wheezing, which  can occur even when she is sitting and doing nothing. The cough occasionally wakes her up from sleep, and she experiences wheezing at night during these episodes. No shortness of breath is associated with the cough.  She has been using an Advair inhaler as needed, typically once or twice a day, despite the instructions to use it twice daily. She also has an albuterol inhaler, which she carries with her but has not been using frequently. She takes omeprazole in the morning for reflux.  She reports sinus drainage running down the back of her throat but does not use any nasal sprays or over-the-counter allergy medications.  Past Medical History:  Diagnosis Date   Anemia    Anxiety    Arthritis    Asthma    Bacterial vaginosis 05/16/2022   Bunion    Callus    Candida vaginitis 09/16/2022   Cholelithiasis without obstruction 04/11/2022   Chronic pain    Cocaine abuse (HCC)    COPD (chronic obstructive pulmonary disease) (HCC)    Corns and callosities    Degenerative joint disease    Depression    Diabetes mellitus    Endometrial polyp    ETOH abuse    Gall stones    Gallbladder calculus with acute cholecystitis and no obstruction 04/11/2022   GERD (gastroesophageal reflux disease)    Headache    history of Migraines   Hepatitis C    Hep C   History of adenomatous polyp of colon 04/11/2022   History of  endometrial ablation 02/06/2013   Hyperlipidemia    Hypertension    Insomnia    Spondylolisthesis of lumbar region    Substance abuse (HCC)    alcoholism   Tuberculosis 1985   Wears dentures    Wears glasses      Family History  Problem Relation Age of Onset   Heart disease Mother    Diabetes Other        mat great aunt   Cirrhosis Other        mat great aunt   Cirrhosis Other        mat great uncles x 2   Colon cancer Neg Hx    Breast cancer Neg Hx      Social History   Socioeconomic History   Marital status: Single    Spouse name: Not on file   Number of  children: 3   Years of education: Not on file   Highest education level: Not on file  Occupational History   Occupation: disablily  Tobacco Use   Smoking status: Former    Current packs/day: 0.00    Average packs/day: 0.8 packs/day for 47.0 years (35.3 ttl pk-yrs)    Types: Cigarettes    Start date: 1    Quit date: 2020    Years since quitting: 5.1    Passive exposure: Past   Smokeless tobacco: Never  Vaping Use   Vaping status: Never Used  Substance and Sexual Activity   Alcohol use: No    Alcohol/week: 0.0 standard drinks of alcohol    Comment: in rehab   Drug use: No    Comment: in rehab   Sexual activity: Not Currently    Birth control/protection: Post-menopausal  Other Topics Concern   Not on file  Social History Narrative   Not on file   Social Drivers of Health   Financial Resource Strain: Low Risk  (04/20/2023)   Overall Financial Resource Strain (CARDIA)    Difficulty of Paying Living Expenses: Not hard at all  Food Insecurity: No Food Insecurity (04/20/2023)   Hunger Vital Sign    Worried About Running Out of Food in the Last Year: Never true    Ran Out of Food in the Last Year: Never true  Transportation Needs: No Transportation Needs (04/20/2023)   PRAPARE - Administrator, Civil Service (Medical): No    Lack of Transportation (Non-Medical): No  Physical Activity: Sufficiently Active (04/20/2023)   Exercise Vital Sign    Days of Exercise per Week: 3 days    Minutes of Exercise per Session: 60 min  Stress: No Stress Concern Present (04/20/2023)   [employer] of Occupational Health - Occupational Stress Questionnaire    Feeling of Stress : Only a little  Social Connections: Moderately Integrated (04/20/2023)   Social Connection and Isolation Panel [NHANES]    Frequency of Communication with Friends and Family: More than three times a week    Frequency of Social Gatherings with Friends and Family: More than three times a week    Attends  Religious Services: More than 4 times per year    Active Member of [employer] or Organizations: Yes    Attends Banker Meetings: More than 4 times per year    Marital Status: Widowed  Intimate Partner Violence: Not At Risk (04/20/2023)   Humiliation, Afraid, Rape, and Kick questionnaire    Fear of Current or Ex-Partner: No    Emotionally Abused: No    Physically Abused: No  Sexually Abused: No     No Known Allergies   Outpatient Medications Prior to Visit  Medication Sig Dispense Refill   Accu-Chek FastClix Lancets MISC Use as directed to check blood sugars 2 times per day dx: e11.65 102 each prn   Accu-Chek Softclix Lancets lancets Use as instructed to check blood sugars twice daily E11.69 100 each 2   acetaminophen (TYLENOL) 325 MG tablet Take 1-2 tablets (325-650 mg total) by mouth every 4 (four) hours as needed for mild pain.     ADVAIR DISKUS 250-50 MCG/ACT AEPB INHALE 1 PUFF INTO THE LUNGS IN THE MORNING AND AT BEDTIME 60 each 6   alendronate (FOSAMAX) 10 MG tablet Take 1 tablet (10 mg total) by mouth daily before breakfast. Take with a full glass of water on an empty stomach. 30 tablet 11   Blood Glucose Monitoring Suppl (ONETOUCH VERIO REFLECT) w/Device KIT Use to check blood sugar up to 3 times a day 1 kit 0   buPROPion (WELLBUTRIN XL) 300 MG 24 hr tablet Take 1 tablet (300 mg total) by mouth daily. (Patient taking differently: Take 300 mg by mouth in the morning and at bedtime.) 90 tablet 0   diclofenac Sodium (VOLTAREN) 1 % GEL Apply 4 g topically 4 (four) times daily. 100 g 2   DULoxetine (CYMBALTA) 60 MG capsule Take 1 capsule (60 mg total) by mouth daily. 90 capsule 3   empagliflozin (JARDIANCE) 10 MG TABS tablet Take 1 tablet (10 mg total) by mouth daily before breakfast. 30 tablet 2   hydrOXYzine (ATARAX) 25 MG tablet Take 1 tablet (25 mg total) by mouth every 8 (eight) hours as needed for anxiety. 30 tablet 1   insulin degludec (TRESIBA FLEXTOUCH) 100 UNIT/ML  FlexTouch Pen Inject 10 Units into the skin at bedtime.     insulin lispro (HUMALOG KWIKPEN) 100 UNIT/ML KwikPen ADMINISTER 5 UNITS UNDER THE SKIN THREE TIMES DAILY WITH MEALS 3 mL 3   Insulin Pen Needle (PEN NEEDLES) 31G X 8 MM MISC UAD 100 each 0   Insulin Syringe-Needle U-100 (INSULIN SYRINGE .3CC/30GX5/16") 30G X 5/16" 0.3 ML MISC Use to inject Levemir twice daily. 100 each 2   Iron, Ferrous Sulfate, 325 (65 Fe) MG TABS Take 325 mg by mouth daily. 30 tablet 2   Lancet Device MISC 1 each by Does not apply route in the morning, at noon, and at bedtime. May substitute to any manufacturer covered by patient's insurance. 100 each 0   LINZESS 145 MCG CAPS capsule Take 145 mcg by mouth daily before breakfast.      losartan-hydrochlorothiazide (HYZAAR) 100-25 MG tablet Take 1 tablet by mouth daily. 90 tablet 3   metFORMIN (GLUCOPHAGE-XR) 500 MG 24 hr tablet Take 2 tablets (1,000 mg total) by mouth daily with breakfast. 90 tablet 3   Multiple Vitamins-Minerals (MULTIVITAMIN WITH MINERALS) tablet Take 1 tablet by mouth daily. Alive     omeprazole (PRILOSEC) 40 MG capsule TAKE 1 CAPSULE(40 MG) BY MOUTH DAILY BEFORE BREAKFAST 90 capsule 0   ondansetron (ZOFRAN-ODT) 4 MG disintegrating tablet Take 1 tablet (4 mg total) by mouth every 8 (eight) hours as needed for nausea or vomiting. 30 tablet 1   ONETOUCH ULTRA test strip USE AS DIRECTED 100 strip 0   tirzepatide (MOUNJARO) 5 MG/0.5ML Pen Inject 5 mg into the skin once a week. 2 mL 2   tiZANidine (ZANAFLEX) 4 MG tablet Take 1 tablet (4 mg total) by mouth 3 (three) times daily. 90 tablet 1  traZODone (DESYREL) 50 MG tablet Take 1 tablet (50 mg total) by mouth at bedtime as needed for sleep. 90 tablet 0   albuterol (VENTOLIN HFA) 108 (90 Base) MCG/ACT inhaler Inhale 2 puffs into the lungs every 6 (six) hours as needed for wheezing or shortness of breath. (Patient taking differently: Inhale 2 puffs into the lungs 2 (two) times daily as needed for wheezing or  shortness of breath.) 18 g 2   atorvastatin (LIPITOR) 10 MG tablet Take 1 tablet (10 mg total) by mouth daily. 30 tablet 2   No facility-administered medications prior to visit.   Review of Systems  Constitutional:  Negative for chills, fever, malaise/fatigue and weight loss.  HENT:  Negative for congestion, sinus pain and sore throat.   Eyes: Negative.   Respiratory:  Positive for cough, shortness of breath and wheezing. Negative for hemoptysis and sputum production.   Cardiovascular:  Negative for chest pain, palpitations, orthopnea, claudication and leg swelling.  Gastrointestinal:  Negative for abdominal pain, heartburn, nausea and vomiting.  Genitourinary: Negative.   Musculoskeletal:  Negative for joint pain and myalgias.  Skin:  Negative for rash.  Neurological:  Negative for weakness.  Endo/Heme/Allergies:  Positive for environmental allergies.  Psychiatric/Behavioral: Negative.      Objective:   Vitals:   12/15/23 0921  BP: 127/80  Pulse: 71  SpO2: 91%  Weight: 223 lb (101.2 kg)  Height: 4\' 11"  (1.499 m)   Physical Exam Constitutional:      General: She is not in acute distress.    Appearance: She is not ill-appearing.  HENT:     Head: Normocephalic and atraumatic.  Cardiovascular:     Rate and Rhythm: Normal rate and regular rhythm.     Pulses: Normal pulses.     Heart sounds: Normal heart sounds. No murmur heard. Pulmonary:     Effort: Pulmonary effort is normal.     Breath sounds: Wheezing (mild left anterior wheeze) present. No rhonchi or rales.  Musculoskeletal:     Right lower leg: No edema.     Left lower leg: No edema.  Skin:    General: Skin is warm and dry.  Neurological:     General: No focal deficit present.     Mental Status: She is alert.    CBC    Component Value Date/Time   WBC 5.6 11/10/2023 1805   RBC 4.81 11/10/2023 1805   HGB 12.9 11/10/2023 1827   HGB 12.0 03/01/2023 1625   HCT 38.0 11/10/2023 1827   HCT 36.2 03/01/2023 1625    PLT 130 (L) 11/10/2023 1805   PLT 174 03/01/2023 1625   MCV 77.1 (L) 11/10/2023 1805   MCV 76 (L) 03/01/2023 1625   MCH 24.5 (L) 11/10/2023 1805   MCHC 31.8 11/10/2023 1805   RDW 14.7 11/10/2023 1805   RDW 15.0 03/01/2023 1625   LYMPHSABS 2.5 11/10/2023 1805   MONOABS 0.1 11/10/2023 1805   EOSABS 0.3 11/10/2023 1805   BASOSABS 0.1 11/10/2023 1805      Latest Ref Rng & Units 11/10/2023    6:27 PM 11/10/2023    6:05 PM 11/01/2023    2:47 PM  BMP  Glucose 70 - 99 mg/dL 829  562  130   BUN 8 - 23 mg/dL 13  11  11    Creatinine 0.44 - 1.00 mg/dL 8.65  7.84  6.96   BUN/Creat Ratio 12 - 28   9   Sodium 135 - 145 mmol/L 141  138  134  Potassium 3.5 - 5.1 mmol/L 4.3  4.2  4.1   Chloride 98 - 111 mmol/L 110  109  98   CO2 22 - 32 mmol/L  20  20   Calcium 8.9 - 10.3 mg/dL  9.1  8.5    Chest imaging: CXR 09/20/2021 Trachea is midline.   Cardiomediastinal contours and hilar structures are stable. Lung volumes are diminished. LEFT lower lobe airspace disease is noted. No gross interstitial thickening. No sign of pleural effusion. Added density over the spine on the lateral view.  PFT:    Latest Ref Rng & Units 06/06/2022   10:39 AM  PFT Results  FVC-Pre L 1.57   FVC-Predicted Pre % 60   FVC-Post L 1.55   FVC-Predicted Post % 59   Pre FEV1/FVC % % 86   Post FEV1/FCV % % 82   FEV1-Pre L 1.34   FEV1-Predicted Pre % 67   FEV1-Post L 1.27   DLCO uncorrected ml/min/mmHg 13.48   DLCO UNC% % 80   DLCO corrected ml/min/mmHg 15.01   DLCO COR %Predicted % 89   DLVA Predicted % 108   TLC L 3.88   TLC % Predicted % 90   RV % Predicted % 101   2023: Non-specific pulmonary function  Labs:  Path:  Echo:  Heart Catheterization:  Assessment & Plan:   Moderate persistent asthma without complication  Chronic cough  Post-nasal drip - Plan: fluticasone (FLONASE) 50 MCG/ACT nasal spray  Shortness of breath - Plan: albuterol (VENTOLIN HFA) 108 (90 Base) MCG/ACT  inhaler  Discussion: Kara Kelley is a 68 year old woman, former smoker with history of DMII and GERD who returns to pulmonary clinic for asthma.   Asthma/COPD Recurrence of dry cough and wheezing, likely related to COPD. No dyspnea. Smoking cessation five years ago. Slight wheeze on the left side.  - Use Advair inhaler scheduled, one puff in the morning and one puff in the evening, rinse mouth after use. - Ensure albuterol inhaler is available for use as needed, up to every four hours. - Refill albuterol inhaler prescription. - Monitor symptoms and report if using albuterol more than two to three times a day.  Post-nasal drip Sinus drainage contributing to cough. Mucus drainage irritates throat, causing cough. - Prescribe Flonase nasal spray, one spray per nostril once a day. - Instruct on proper technique for using nasal spray.  Gastroesophageal Reflux Disease (GERD) Occasional heartburn without increased cough. Reflux can irritate throat and contribute to dry cough. - Continue omeprazole in the morning. - Consider adding a second medication at bedtime if reflux symptoms worsen.  Follow up in 6 months  Melody Comas, MD [facility] Office: [phone]   Current Outpatient Medications:    Accu-Chek FastClix Lancets MISC, Use as directed to check blood sugars 2 times per day dx: e11.65, Disp: 102 each, Rfl: prn   Accu-Chek Softclix Lancets lancets, Use as instructed to check blood sugars twice daily E11.69, Disp: 100 each, Rfl: 2   acetaminophen (TYLENOL) 325 MG tablet, Take 1-2 tablets (325-650 mg total) by mouth every 4 (four) hours as needed for mild pain., Disp: , Rfl:    ADVAIR DISKUS 250-50 MCG/ACT AEPB, INHALE 1 PUFF INTO THE LUNGS IN THE MORNING AND AT BEDTIME, Disp: 60 each, Rfl: 6   alendronate (FOSAMAX) 10 MG tablet, Take 1 tablet (10 mg total) by mouth daily before breakfast. Take with a full glass of water on an empty stomach., Disp: 30  tablet, Rfl:  11   Blood Glucose Monitoring Suppl (ONETOUCH VERIO REFLECT) w/Device KIT, Use to check blood sugar up to 3 times a day, Disp: 1 kit, Rfl: 0   buPROPion (WELLBUTRIN XL) 300 MG 24 hr tablet, Take 1 tablet (300 mg total) by mouth daily. (Patient taking differently: Take 300 mg by mouth in the morning and at bedtime.), Disp: 90 tablet, Rfl: 0   diclofenac Sodium (VOLTAREN) 1 % GEL, Apply 4 g topically 4 (four) times daily., Disp: 100 g, Rfl: 2   DULoxetine (CYMBALTA) 60 MG capsule, Take 1 capsule (60 mg total) by mouth daily., Disp: 90 capsule, Rfl: 3   empagliflozin (JARDIANCE) 10 MG TABS tablet, Take 1 tablet (10 mg total) by mouth daily before breakfast., Disp: 30 tablet, Rfl: 2   fluticasone (FLONASE) 50 MCG/ACT nasal spray, Place 1 spray into both nostrils daily., Disp: 16 g, Rfl: 2   hydrOXYzine (ATARAX) 25 MG tablet, Take 1 tablet (25 mg total) by mouth every 8 (eight) hours as needed for anxiety., Disp: 30 tablet, Rfl: 1   insulin degludec (TRESIBA FLEXTOUCH) 100 UNIT/ML FlexTouch Pen, Inject 10 Units into the skin at bedtime., Disp: , Rfl:    insulin lispro (HUMALOG KWIKPEN) 100 UNIT/ML KwikPen, ADMINISTER 5 UNITS UNDER THE SKIN THREE TIMES DAILY WITH MEALS, Disp: 3 mL, Rfl: 3   Insulin Pen Needle (PEN NEEDLES) 31G X 8 MM MISC, UAD, Disp: 100 each, Rfl: 0   Insulin Syringe-Needle U-100 (INSULIN SYRINGE .3CC/30GX5/16") 30G X 5/16" 0.3 ML MISC, Use to inject Levemir twice daily., Disp: 100 each, Rfl: 2   Iron, Ferrous Sulfate, 325 (65 Fe) MG TABS, Take 325 mg by mouth daily., Disp: 30 tablet, Rfl: 2   Lancet Device MISC, 1 each by Does not apply route in the morning, at noon, and at bedtime. May substitute to any manufacturer covered by patient's insurance., Disp: 100 each, Rfl: 0   LINZESS 145 MCG CAPS capsule, Take 145 mcg by mouth daily before breakfast. , Disp: , Rfl:    losartan-hydrochlorothiazide (HYZAAR) 100-25 MG tablet, Take 1 tablet by mouth daily., Disp: 90 tablet, Rfl:  3   metFORMIN (GLUCOPHAGE-XR) 500 MG 24 hr tablet, Take 2 tablets (1,000 mg total) by mouth daily with breakfast., Disp: 90 tablet, Rfl: 3   Multiple Vitamins-Minerals (MULTIVITAMIN WITH MINERALS) tablet, Take 1 tablet by mouth daily. Alive, Disp: , Rfl:    omeprazole (PRILOSEC) 40 MG capsule, TAKE 1 CAPSULE(40 MG) BY MOUTH DAILY BEFORE BREAKFAST, Disp: 90 capsule, Rfl: 0   ondansetron (ZOFRAN-ODT) 4 MG disintegrating tablet, Take 1 tablet (4 mg total) by mouth every 8 (eight) hours as needed for nausea or vomiting., Disp: 30 tablet, Rfl: 1   ONETOUCH ULTRA test strip, USE AS DIRECTED, Disp: 100 strip, Rfl: 0   tirzepatide (MOUNJARO) 5 MG/0.5ML Pen, Inject 5 mg into the skin once a week., Disp: 2 mL, Rfl: 2   tiZANidine (ZANAFLEX) 4 MG tablet, Take 1 tablet (4 mg total) by mouth 3 (three) times daily., Disp: 90 tablet, Rfl: 1   traZODone (DESYREL) 50 MG tablet, Take 1 tablet (50 mg total) by mouth at bedtime as needed for sleep., Disp: 90 tablet, Rfl: 0   albuterol (VENTOLIN HFA) 108 (90 Base) MCG/ACT inhaler, Inhale 1-2 puffs into the lungs every 6 (six) hours as needed for wheezing or shortness of breath., Disp: 18 g, Rfl: 2   atorvastatin (LIPITOR) 10 MG tablet, Take 1 tablet (10 mg total) by mouth daily., Disp: 30 tablet, Rfl: 2

## 2023-12-15 NOTE — Patient Instructions (Addendum)
 Use advair inhaler 1 puff twice daily - leave by your toothbrush to use morning and evening time and then brush/rinse your mouth out after each use  Use albuterol inhaler 1-2 puffs every 4-6 hours as needed  Start flonase nasal spray, 1 spray per nostril daily  If symptoms aren't improving please message [place]  Follow up in 6 months

## 2023-12-18 NOTE — Telephone Encounter (Signed)
 Copied from CRM [id number]. Topic: Appointments - Appointment Scheduling >> Dec 18, 2023  1:53 PM [name] wrote: Patient would like an appointment, but first available is May. Patient would like a sooner appointment please advise.  Spoke with the pt and she has sch an appt for the following day:  Name: Kara Kelley, Fiorini MRN: [id number]  Date: 12/21/2023 Status: Sch  Time: 2:15 PM Length: 30  Visit Type: OPEN ESTABLISHED [726] Copay: $0.00  Provider: Lovie Macadamia, MD

## 2023-12-18 NOTE — Telephone Encounter (Addendum)
 Pt stated she continues to the muscle spasms; requesting an appt to discuss with the doctor. Call transferred to front office to schedule pt an appt. Front office stated pt refuse an appt on Wed and stated she does not needs an appt.

## 2023-12-21 ENCOUNTER — Ambulatory Visit: Admitting: Student

## 2023-12-21 VITALS — BP 159/64 | HR 83 | Temp 97.6°F | Ht 59.0 in | Wt 226.1 lb

## 2023-12-21 DIAGNOSIS — R252 Cramp and spasm: Secondary | ICD-10-CM

## 2023-12-21 DIAGNOSIS — E162 Hypoglycemia, unspecified: Secondary | ICD-10-CM

## 2023-12-21 DIAGNOSIS — R7989 Other specified abnormal findings of blood chemistry: Secondary | ICD-10-CM

## 2023-12-21 DIAGNOSIS — E1122 Type 2 diabetes mellitus with diabetic chronic kidney disease: Secondary | ICD-10-CM

## 2023-12-21 DIAGNOSIS — D649 Anemia, unspecified: Secondary | ICD-10-CM | POA: Diagnosis not present

## 2023-12-21 DIAGNOSIS — E1165 Type 2 diabetes mellitus with hyperglycemia: Secondary | ICD-10-CM

## 2023-12-21 DIAGNOSIS — Z7985 Long-term (current) use of injectable non-insulin antidiabetic drugs: Secondary | ICD-10-CM

## 2023-12-21 DIAGNOSIS — D509 Iron deficiency anemia, unspecified: Secondary | ICD-10-CM

## 2023-12-21 DIAGNOSIS — B182 Chronic viral hepatitis C: Secondary | ICD-10-CM

## 2023-12-21 DIAGNOSIS — F1910 Other psychoactive substance abuse, uncomplicated: Secondary | ICD-10-CM

## 2023-12-21 DIAGNOSIS — I10 Essential (primary) hypertension: Secondary | ICD-10-CM

## 2023-12-21 DIAGNOSIS — N1832 Chronic kidney disease, stage 3b: Secondary | ICD-10-CM

## 2023-12-21 DIAGNOSIS — Z794 Long term (current) use of insulin: Secondary | ICD-10-CM

## 2023-12-21 LAB — GLUCOSE, CAPILLARY
Glucose-Capillary: 46 mg/dL — ABNORMAL LOW (ref 70–99)
Glucose-Capillary: 66 mg/dL — ABNORMAL LOW (ref 70–99)
Glucose-Capillary: 91 mg/dL (ref 70–99)

## 2023-12-21 MED ORDER — GLUCOSE 40 % PO GEL
1.0000 | Freq: Once | ORAL | Status: AC
  Administered 2023-12-21: 31 g via ORAL

## 2023-12-21 MED ORDER — ONETOUCH ULTRA VI STRP: 1.0000 | ORAL_STRIP | 0 refills | Status: DC

## 2023-12-21 NOTE — Progress Notes (Signed)
 Subjective:  CC: Cramps  HPI:  Kara Kelley is a 68 y.o. person with a past medical history stated below and presents today for the stated chief complaint. Please see problem based assessment and plan for additional details.  Past Medical History:  Diagnosis Date   Anemia    Anxiety    Arthritis    Asthma    Bacterial vaginosis 05/16/2022   Bunion    Callus    Candida vaginitis 09/16/2022   Cholelithiasis without obstruction 04/11/2022   Chronic pain    Cocaine abuse (HCC)    COPD (chronic obstructive pulmonary disease) (HCC)    Corns and callosities    Degenerative joint disease    Depression    Diabetes mellitus    Endometrial polyp    ETOH abuse    Gall stones    Gallbladder calculus with acute cholecystitis and no obstruction 04/11/2022   GERD (gastroesophageal reflux disease)    Headache    history of Migraines   Hepatitis C    Hep C   History of adenomatous polyp of colon 04/11/2022   History of endometrial ablation 02/06/2013   Hyperlipidemia    Hypertension    Insomnia    Spondylolisthesis of lumbar region    Substance abuse (HCC)    alcoholism   Tuberculosis 1985   Wears dentures    Wears glasses     Current Outpatient Medications on File Prior to Visit  Medication Sig Dispense Refill   Accu-Chek FastClix Lancets MISC Use as directed to check blood sugars 2 times per day dx: e11.65 102 each prn   Accu-Chek Softclix Lancets lancets Use as instructed to check blood sugars twice daily E11.69 100 each 2   acetaminophen (TYLENOL) 325 MG tablet Take 1-2 tablets (325-650 mg total) by mouth every 4 (four) hours as needed for mild pain.     ADVAIR DISKUS 250-50 MCG/ACT AEPB INHALE 1 PUFF INTO THE LUNGS IN THE MORNING AND AT BEDTIME 60 each 6   albuterol (VENTOLIN HFA) 108 (90 Base) MCG/ACT inhaler Inhale 1-2 puffs into the lungs every 6 (six) hours as needed for wheezing or shortness of breath. 18 g 2   alendronate (FOSAMAX) 10 MG tablet Take 1  tablet (10 mg total) by mouth daily before breakfast. Take with a full glass of water on an empty stomach. 30 tablet 11   atorvastatin (LIPITOR) 10 MG tablet Take 1 tablet (10 mg total) by mouth daily. 30 tablet 2   Blood Glucose Monitoring Suppl (ONETOUCH VERIO REFLECT) w/Device KIT Use to check blood sugar up to 3 times a day 1 kit 0   buPROPion (WELLBUTRIN XL) 300 MG 24 hr tablet Take 1 tablet (300 mg total) by mouth daily. (Patient taking differently: Take 300 mg by mouth in the morning and at bedtime.) 90 tablet 0   diclofenac Sodium (VOLTAREN) 1 % GEL Apply 4 g topically 4 (four) times daily. 100 g 2   DULoxetine (CYMBALTA) 60 MG capsule Take 1 capsule (60 mg total) by mouth daily. 90 capsule 3   empagliflozin (JARDIANCE) 10 MG TABS tablet Take 1 tablet (10 mg total) by mouth daily before breakfast. 30 tablet 2   fluticasone (FLONASE) 50 MCG/ACT nasal spray Place 1 spray into both nostrils daily. 16 g 2   hydrOXYzine (ATARAX) 25 MG tablet Take 1 tablet (25 mg total) by mouth every 8 (eight) hours as needed for anxiety. 30 tablet 1   insulin degludec (TRESIBA FLEXTOUCH) 100 UNIT/ML  FlexTouch Pen Inject 10 Units into the skin at bedtime.     insulin lispro (HUMALOG KWIKPEN) 100 UNIT/ML KwikPen ADMINISTER 5 UNITS UNDER THE SKIN THREE TIMES DAILY WITH MEALS 3 mL 3   Insulin Pen Needle (PEN NEEDLES) 31G X 8 MM MISC UAD 100 each 0   Insulin Syringe-Needle U-100 (INSULIN SYRINGE .3CC/30GX5/16") 30G X 5/16" 0.3 ML MISC Use to inject Levemir twice daily. 100 each 2   Iron, Ferrous Sulfate, 325 (65 Fe) MG TABS Take 325 mg by mouth daily. 30 tablet 2   Lancet Device MISC 1 each by Does not apply route in the morning, at noon, and at bedtime. May substitute to any manufacturer covered by patient's insurance. 100 each 0   LINZESS 145 MCG CAPS capsule Take 145 mcg by mouth daily before breakfast.      losartan-hydrochlorothiazide (HYZAAR) 100-25 MG tablet Take 1 tablet by mouth daily. 90 tablet 3    metFORMIN (GLUCOPHAGE-XR) 500 MG 24 hr tablet Take 2 tablets (1,000 mg total) by mouth daily with breakfast. 90 tablet 3   Multiple Vitamins-Minerals (MULTIVITAMIN WITH MINERALS) tablet Take 1 tablet by mouth daily. Alive     omeprazole (PRILOSEC) 40 MG capsule TAKE 1 CAPSULE(40 MG) BY MOUTH DAILY BEFORE BREAKFAST 90 capsule 0   ondansetron (ZOFRAN-ODT) 4 MG disintegrating tablet Take 1 tablet (4 mg total) by mouth every 8 (eight) hours as needed for nausea or vomiting. 30 tablet 1   tirzepatide (MOUNJARO) 5 MG/0.5ML Pen Inject 5 mg into the skin once a week. 2 mL 2   tiZANidine (ZANAFLEX) 4 MG tablet Take 1 tablet (4 mg total) by mouth 3 (three) times daily. 90 tablet 1   traZODone (DESYREL) 50 MG tablet Take 1 tablet (50 mg total) by mouth at bedtime as needed for sleep. 90 tablet 0   No current facility-administered medications on file prior to visit.    Review of Systems: Please see assessment and plan for pertinent positives and negatives.  Objective:   Vitals:   12/21/23 1410 12/21/23 1444  BP: (!) 144/63 (!) 159/64  Pulse: 65 83  Temp: 97.6 F (36.4 C)   TempSrc: Oral   SpO2: 99%   Weight: 226 lb 1.6 oz (102.6 kg)   Height: 4\' 11"  (1.499 m)     Physical Exam: Constitutional: Well-appearing Cardiovascular: Regular rate and rhythm Pulmonary/Chest: lungs clear to auscultation bilaterally Abdominal: Distended, diffuse mild tenderness to palpation.   Extremities: No edema of the lower extremities bilaterally Psych: Pleasant affect Thought process is linear and is goal-directed.     Assessment & Plan:  Uncontrolled type 2 diabetes mellitus with hyperglycemia (HCC) Relatively controlled, last A1c 7.4.  She did have an episode of hypoglycemia while in office with [place] today.  Today her blood glucose did drop to 46, she was treated with 2 tubes of glucose gel.  Cramps, muscle, general Ongoing for many years now per chart review, unclear etiology.  She has been treated with  multiple medications, currently on tizanidine with little relief.  She says the cramping and spasms are all over her body, into her hands and legs and cause them to not help.  Unsure if they are worse at night, but it seems like they may be.  I discussed with her that it is difficult to find a cause for this sometimes and describes can be difficult to treat.  In looking back at some of her lab work and history, she has a history of hepatitis C as  well as polysubstance abuse including alcohol use.  She states that she used to drink as many beers that she could on any given day.  I do see she had thrombocytopenia in the past, she does have a good amount of abdominal distention today.  I also see history of fatty infiltration of the liver on prior imaging studies.  Fib 4 score calculated today and elevated to 2.62.  Liver pathology can sometimes be associated with cramping. She does also have history of abnormal thyroid studies, most recently TSH was low, with a normal T3 and T4.  This certainly may be contributing to some of her symptoms.  Plan: TSH Free T4 CMP Right upper quadrant ultrasound Mag Reccommended patient to take B complex vitamins   Anemia History of anemia, currently on iron supplementation.  She feels as if her iron levels are low because she states she is cold.  Likely unrelated, though she has not had her iron levels checked in some years.  Would benefit from iron panel and CBC to guide further therapy. Plan: CBC Iron panel    Patient discussed with Dr. Alver Sorrow MD [facility]  PGY-1 Pager: [phone]  Phone: [phone] Date 12/21/2023  Time 3:19 PM

## 2023-12-21 NOTE — Assessment & Plan Note (Signed)
 History of anemia, currently on iron supplementation.  She feels as if her iron levels are low because she states she is cold.  Likely unrelated, though she has not had her iron levels checked in some years.  Would benefit from iron panel and CBC to guide further therapy. Plan: CBC Iron panel

## 2023-12-21 NOTE — Progress Notes (Signed)
 Hypoglycemic Event   CBG: 46 at 14:48  Treatment: 2 tubes glucose gel  Symptoms: Sweaty, Shaky, and Hungry  Follow-up  CBG: Time:  15:21 CBG Result:66  Symptoms: Shaky  Treatment: Rondel Oh Butter (last ate last night)  Follow -up CBG: Time: 15:41  CBG Result: 91  Possible Reasons for Event: Inadequate meal intake  Comments/MD notified:MD Aware    Kara Kelley

## 2023-12-21 NOTE — Assessment & Plan Note (Signed)
 Relatively controlled, last A1c 7.4.  She did have an episode of hypoglycemia while in office with [place] today.  Today her blood glucose did drop to 46, she was treated with 2 tubes of glucose gel.

## 2023-12-21 NOTE — Assessment & Plan Note (Addendum)
 Ongoing for many years now per chart review, unclear etiology.  She has been treated with multiple medications, currently on tizanidine with little relief.  She says the cramping and spasms are all over her body, into her hands and legs and cause them to not help.  Unsure if they are worse at night, but it seems like they may be.  I discussed with her that it is difficult to find a cause for this sometimes and describes can be difficult to treat.  In looking back at some of her lab work and history, she has a history of hepatitis C as well as polysubstance abuse including alcohol use.  She states that she used to drink as many beers that she could on any given day.  I do see she had thrombocytopenia in the past, she does have a good amount of abdominal distention today.  I also see history of fatty infiltration of the liver on prior imaging studies.  Fib 4 score calculated today and elevated to 2.62.  Liver pathology can sometimes be associated with cramping. She does also have history of abnormal thyroid studies, most recently TSH was low, with a normal T3 and T4.  This certainly may be contributing to some of her symptoms.  Plan: TSH Free T4 CMP Right upper quadrant ultrasound Mag Reccommended patient to take B complex vitamins

## 2023-12-21 NOTE — Patient Instructions (Addendum)
 Thank you, [name] for allowing [place] to provide your care today.  I have ordered the following tests for you:   Lab Orders         Glucose, capillary         TSH         T4, Free         CBC no Diff         CMP14 + Anion Gap         Iron, TIBC and Ferritin Panel         Magnesium      I will order an ultrasound of your abdomen to evaluate you liver. They should reach out to you regarding how the follow this up.   I have ordered the following medication/changed the following medications:   Please pick up an over the counter B-Complex vitamin supplement and begin taking this daily.    Follow up:  6-8 weeks  for A1c, Diabetes follow up    We look forward to seeing you next time. Please call our clinic at [phone] if you have any questions or concerns. The best time to call is Monday-Friday from 9am-4pm, but there is someone available 24/7. If after hours or the weekend, call the main hospital number and ask for the Internal Medicine Resident On-Call. If you need medication refills, please notify your pharmacy one week in advance and they will send [place] a request.   Thank you for trusting me with your care. Wishing you the best!  Lovie Macadamia MD [facility]

## 2023-12-22 LAB — CBC
Hematocrit: 33.9 % — ABNORMAL LOW (ref 34.0–46.6)
Hemoglobin: 10.7 g/dL — ABNORMAL LOW (ref 11.1–15.9)
MCH: 24.2 pg — ABNORMAL LOW (ref 26.6–33.0)
MCHC: 31.6 g/dL (ref 31.5–35.7)
MCV: 77 fL — ABNORMAL LOW (ref 79–97)
Platelets: 163 10*3/uL (ref 150–450)
RBC: 4.43 x10E6/uL (ref 3.77–5.28)
RDW: 15.3 % (ref 11.7–15.4)
WBC: 7.4 10*3/uL (ref 3.4–10.8)

## 2023-12-22 LAB — CMP14 + ANION GAP
ALT: 11 IU/L (ref 0–32)
AST: 14 IU/L (ref 0–40)
Albumin: 4.2 g/dL (ref 3.9–4.9)
Alkaline Phosphatase: 135 IU/L — ABNORMAL HIGH (ref 44–121)
Anion Gap: 16 mmol/L (ref 10.0–18.0)
BUN/Creatinine Ratio: 10 — ABNORMAL LOW (ref 12–28)
BUN: 13 mg/dL (ref 8–27)
Bilirubin Total: <0.2 mg/dL (ref 0.0–1.2)
CO2: 18 mmol/L — ABNORMAL LOW (ref 20–29)
Calcium: 8.7 mg/dL (ref 8.7–10.3)
Chloride: 106 mmol/L (ref 96–106)
Creatinine, Ser: 1.31 mg/dL — ABNORMAL HIGH (ref 0.57–1.00)
Globulin, Total: 2.5 g/dL (ref 1.5–4.5)
Glucose: 71 mg/dL (ref 70–99)
Potassium: 4.3 mmol/L (ref 3.5–5.2)
Sodium: 140 mmol/L (ref 134–144)
Total Protein: 6.7 g/dL (ref 6.0–8.5)
eGFR: 45 mL/min/{1.73_m2} — ABNORMAL LOW (ref >59)

## 2023-12-22 LAB — IRON,TIBC AND FERRITIN PANEL
Ferritin: 171 ng/mL — ABNORMAL HIGH (ref 15–150)
Iron Saturation: 24 % (ref 15–55)
Iron: 72 ug/dL (ref 27–139)
Total Iron Binding Capacity: 298 ug/dL (ref 250–450)
UIBC: 226 ug/dL (ref 118–369)

## 2023-12-22 LAB — TSH: TSH: 2.05 u[IU]/mL (ref 0.450–4.500)

## 2023-12-22 LAB — T4, FREE: Free T4: 1.04 ng/dL (ref 0.82–1.77)

## 2023-12-22 LAB — MAGNESIUM: Magnesium: 1.6 mg/dL (ref 1.6–2.3)

## 2023-12-26 ENCOUNTER — Encounter: Payer: Self-pay | Admitting: Student

## 2023-12-27 ENCOUNTER — Other Ambulatory Visit: Payer: Self-pay | Admitting: Student

## 2023-12-27 DIAGNOSIS — G4700 Insomnia, unspecified: Secondary | ICD-10-CM

## 2023-12-28 ENCOUNTER — Other Ambulatory Visit: Payer: Self-pay | Admitting: Student

## 2023-12-28 DIAGNOSIS — N1832 Chronic kidney disease, stage 3b: Secondary | ICD-10-CM

## 2023-12-28 NOTE — Progress Notes (Signed)
 Internal Medicine Clinic Attending  Case discussed with the resident at the time of the visit.  We reviewed the resident's history and exam and pertinent patient test results.  I agree with the assessment, diagnosis, and plan of care documented in the resident's note.

## 2023-12-28 NOTE — Telephone Encounter (Signed)
 Copied from CRM [id number]. Topic: Clinical - Medication Refill >> Dec 28, 2023 12:06 PM Irine Seal wrote: Most Recent Primary Care Visit:  Provider: Lovie Macadamia  Department: [facility]  Visit Type: OPEN ESTABLISHED  Date: 12/21/2023  Medication: glucose blood (ONETOUCH ULTRA) test strip,  Accu-Chek FastClix Lancets Accu-Chek Softclix Lancets    Has the patient contacted their pharmacy? Yes (Agent: If no, request that the patient contact the pharmacy for the refill. If patient does not wish to contact the pharmacy document the reason why and proceed with request.) (Agent: If yes, when and what did the pharmacy advise?)  Is this the correct pharmacy for this prescription? Yes If no, delete pharmacy and type the correct one.  This is the patient's preferred pharmacy:    [facility] [phone] - [place], [place] - [address] AT [facility] [address] [place] [address] Phone: [phone] Fax: [phone]   Has the prescription been filled recently? No  Is the patient out of the medication? Yes  Has the patient been seen for an appointment in the last year OR does the patient have an upcoming appointment? Yes  Can we respond through MyChart? Yes  Agent: Please be advised that Rx refills may take up to 3 business days. We ask that you follow-up with your pharmacy.

## 2023-12-29 DIAGNOSIS — M25561 Pain in right knee: Secondary | ICD-10-CM | POA: Diagnosis not present

## 2023-12-29 DIAGNOSIS — M170 Bilateral primary osteoarthritis of knee: Secondary | ICD-10-CM | POA: Diagnosis not present

## 2023-12-29 DIAGNOSIS — M25562 Pain in left knee: Secondary | ICD-10-CM | POA: Diagnosis not present

## 2023-12-29 DIAGNOSIS — R262 Difficulty in walking, not elsewhere classified: Secondary | ICD-10-CM | POA: Diagnosis not present

## 2023-12-29 MED ORDER — ACCU-CHEK FASTCLIX LANCETS MISC: 99 refills | Status: AC

## 2023-12-29 MED ORDER — ACCU-CHEK SOFTCLIX LANCETS MISC: 2 refills | Status: DC

## 2023-12-29 NOTE — Telephone Encounter (Signed)
 Medication sent to pharmacy

## 2024-01-03 ENCOUNTER — Ambulatory Visit: Admitting: Family Medicine

## 2024-01-05 DIAGNOSIS — M25562 Pain in left knee: Secondary | ICD-10-CM | POA: Diagnosis not present

## 2024-01-05 DIAGNOSIS — M25561 Pain in right knee: Secondary | ICD-10-CM | POA: Diagnosis not present

## 2024-01-05 DIAGNOSIS — M170 Bilateral primary osteoarthritis of knee: Secondary | ICD-10-CM | POA: Diagnosis not present

## 2024-01-05 DIAGNOSIS — R262 Difficulty in walking, not elsewhere classified: Secondary | ICD-10-CM | POA: Diagnosis not present

## 2024-01-08 ENCOUNTER — Ambulatory Visit
Admission: RE | Admit: 2024-01-08 | Discharge: 2024-01-08 | Disposition: A | Discharge status: Home / Self Care | Payer: 59 | Source: Ambulatory Visit | Attending: Internal Medicine

## 2024-01-08 DIAGNOSIS — Z1231 Encounter for screening mammogram for malignant neoplasm of breast: Secondary | ICD-10-CM | POA: Diagnosis not present

## 2024-01-12 ENCOUNTER — Other Ambulatory Visit: Payer: Self-pay | Admitting: Student

## 2024-01-12 DIAGNOSIS — N1832 Chronic kidney disease, stage 3b: Secondary | ICD-10-CM

## 2024-01-12 DIAGNOSIS — R262 Difficulty in walking, not elsewhere classified: Secondary | ICD-10-CM | POA: Diagnosis not present

## 2024-01-12 DIAGNOSIS — M170 Bilateral primary osteoarthritis of knee: Secondary | ICD-10-CM | POA: Diagnosis not present

## 2024-01-12 DIAGNOSIS — M25561 Pain in right knee: Secondary | ICD-10-CM | POA: Diagnosis not present

## 2024-01-12 DIAGNOSIS — M25562 Pain in left knee: Secondary | ICD-10-CM | POA: Diagnosis not present

## 2024-01-15 ENCOUNTER — Ambulatory Visit (HOSPITAL_COMMUNITY)

## 2024-01-15 DIAGNOSIS — M545 Low back pain, unspecified: Secondary | ICD-10-CM | POA: Diagnosis not present

## 2024-01-15 DIAGNOSIS — M542 Cervicalgia: Secondary | ICD-10-CM | POA: Diagnosis not present

## 2024-01-15 DIAGNOSIS — Z131 Encounter for screening for diabetes mellitus: Secondary | ICD-10-CM | POA: Diagnosis not present

## 2024-01-15 DIAGNOSIS — G8929 Other chronic pain: Secondary | ICD-10-CM | POA: Diagnosis not present

## 2024-01-15 DIAGNOSIS — E559 Vitamin D deficiency, unspecified: Secondary | ICD-10-CM | POA: Diagnosis not present

## 2024-01-15 DIAGNOSIS — R0602 Shortness of breath: Secondary | ICD-10-CM | POA: Diagnosis not present

## 2024-01-15 DIAGNOSIS — Z1159 Encounter for screening for other viral diseases: Secondary | ICD-10-CM | POA: Diagnosis not present

## 2024-01-15 DIAGNOSIS — M129 Arthropathy, unspecified: Secondary | ICD-10-CM | POA: Diagnosis not present

## 2024-01-15 DIAGNOSIS — Z79899 Other long term (current) drug therapy: Secondary | ICD-10-CM | POA: Diagnosis not present

## 2024-01-15 DIAGNOSIS — E039 Hypothyroidism, unspecified: Secondary | ICD-10-CM | POA: Diagnosis not present

## 2024-01-15 DIAGNOSIS — R5383 Other fatigue: Secondary | ICD-10-CM | POA: Diagnosis not present

## 2024-01-15 DIAGNOSIS — M546 Pain in thoracic spine: Secondary | ICD-10-CM | POA: Diagnosis not present

## 2024-01-15 DIAGNOSIS — E7800 Pure hypercholesterolemia, unspecified: Secondary | ICD-10-CM | POA: Diagnosis not present

## 2024-01-19 DIAGNOSIS — M25562 Pain in left knee: Secondary | ICD-10-CM | POA: Diagnosis not present

## 2024-01-19 DIAGNOSIS — M25661 Stiffness of right knee, not elsewhere classified: Secondary | ICD-10-CM | POA: Diagnosis not present

## 2024-01-19 DIAGNOSIS — M170 Bilateral primary osteoarthritis of knee: Secondary | ICD-10-CM | POA: Diagnosis not present

## 2024-01-19 DIAGNOSIS — M1711 Unilateral primary osteoarthritis, right knee: Secondary | ICD-10-CM | POA: Diagnosis not present

## 2024-01-19 DIAGNOSIS — M1712 Unilateral primary osteoarthritis, left knee: Secondary | ICD-10-CM | POA: Diagnosis not present

## 2024-01-19 DIAGNOSIS — R262 Difficulty in walking, not elsewhere classified: Secondary | ICD-10-CM | POA: Diagnosis not present

## 2024-01-19 DIAGNOSIS — M25662 Stiffness of left knee, not elsewhere classified: Secondary | ICD-10-CM | POA: Diagnosis not present

## 2024-01-19 DIAGNOSIS — M25561 Pain in right knee: Secondary | ICD-10-CM | POA: Diagnosis not present

## 2024-01-25 ENCOUNTER — Other Ambulatory Visit: Payer: Self-pay | Admitting: Pulmonary Disease

## 2024-01-25 DIAGNOSIS — E119 Type 2 diabetes mellitus without complications: Secondary | ICD-10-CM | POA: Diagnosis not present

## 2024-01-25 DIAGNOSIS — M545 Low back pain, unspecified: Secondary | ICD-10-CM | POA: Diagnosis not present

## 2024-01-25 DIAGNOSIS — R29818 Other symptoms and signs involving the nervous system: Secondary | ICD-10-CM | POA: Diagnosis not present

## 2024-01-25 DIAGNOSIS — R0602 Shortness of breath: Secondary | ICD-10-CM

## 2024-01-25 DIAGNOSIS — E7800 Pure hypercholesterolemia, unspecified: Secondary | ICD-10-CM | POA: Diagnosis not present

## 2024-01-25 DIAGNOSIS — E559 Vitamin D deficiency, unspecified: Secondary | ICD-10-CM | POA: Diagnosis not present

## 2024-01-25 DIAGNOSIS — Z794 Long term (current) use of insulin: Secondary | ICD-10-CM | POA: Diagnosis not present

## 2024-01-25 DIAGNOSIS — G8929 Other chronic pain: Secondary | ICD-10-CM | POA: Diagnosis not present

## 2024-01-25 DIAGNOSIS — R030 Elevated blood-pressure reading, without diagnosis of hypertension: Secondary | ICD-10-CM | POA: Diagnosis not present

## 2024-01-25 DIAGNOSIS — R768 Other specified abnormal immunological findings in serum: Secondary | ICD-10-CM | POA: Diagnosis not present

## 2024-01-26 DIAGNOSIS — R262 Difficulty in walking, not elsewhere classified: Secondary | ICD-10-CM | POA: Diagnosis not present

## 2024-01-26 DIAGNOSIS — M25561 Pain in right knee: Secondary | ICD-10-CM | POA: Diagnosis not present

## 2024-01-26 DIAGNOSIS — M170 Bilateral primary osteoarthritis of knee: Secondary | ICD-10-CM | POA: Diagnosis not present

## 2024-01-26 DIAGNOSIS — M25562 Pain in left knee: Secondary | ICD-10-CM | POA: Diagnosis not present

## 2024-01-28 ENCOUNTER — Other Ambulatory Visit: Payer: Self-pay | Admitting: Student

## 2024-01-28 DIAGNOSIS — G4700 Insomnia, unspecified: Secondary | ICD-10-CM

## 2024-02-05 ENCOUNTER — Other Ambulatory Visit: Payer: Self-pay | Admitting: *Deleted

## 2024-02-05 ENCOUNTER — Telehealth: Payer: Self-pay | Admitting: Student

## 2024-02-05 ENCOUNTER — Other Ambulatory Visit: Payer: Self-pay | Admitting: Pulmonary Disease

## 2024-02-05 ENCOUNTER — Other Ambulatory Visit: Payer: Self-pay | Admitting: Student

## 2024-02-05 DIAGNOSIS — N1832 Chronic kidney disease, stage 3b: Secondary | ICD-10-CM

## 2024-02-05 DIAGNOSIS — J4540 Moderate persistent asthma, uncomplicated: Secondary | ICD-10-CM

## 2024-02-05 MED ORDER — ONETOUCH ULTRA VI STRP: 1.0000 | ORAL_STRIP | 0 refills | Status: DC

## 2024-02-05 NOTE — Telephone Encounter (Signed)
 Copied from CRM [id number]. Topic: Clinical - Prescription Issue >> Feb 05, 2024  4:13 PM Jayson Michael wrote: Reason for CRM: Patient reports that [facility] continues to send her bupropion , but she does not need any refills at this time. She has received multiple extra bottles and is concerned it may cause issues with her insurance due to frequent shipments.

## 2024-02-05 NOTE — Addendum Note (Signed)
 Addended by: Calbert Hulsebus F on: 02/05/2024 04:48 PM   Modules accepted: Orders

## 2024-02-05 NOTE — Telephone Encounter (Signed)
 Copied from CRM [id number]. Topic: Clinical - Medication Refill >> Feb 05, 2024  4:10 PM Jayson Michael wrote: Most Recent Primary Care Visit:  Provider: Sheree Dieter  Department: [facility]  Visit Type: OPEN ESTABLISHED  Date: 12/21/2023  Medication: glucose blood (ONETOUCH ULTRA) test strip  Has the patient contacted their pharmacy? No (Agent: If no, request that the patient contact the pharmacy for the refill. If patient does not wish to contact the pharmacy document the reason why and proceed with request.) (Agent: If yes, when and what did the pharmacy advise?)  Is this the correct pharmacy for this prescription? Yes If no, delete pharmacy and type the correct one.  This is the patient's preferred pharmacy:   [facility] [phone] - [place], [place] - [address] AT [facility] [address] [place] [address] Phone: [phone] Fax: [phone]   Has the prescription been filled recently? No  Is the patient out of the medication? Yes  Has the patient been seen for an appointment in the last year OR does the patient have an upcoming appointment? Yes  Can we respond through MyChart? Yes  Agent: Please be advised that Rx refills may take up to 3 business days. We ask that you follow-up with your pharmacy.

## 2024-02-05 NOTE — Telephone Encounter (Signed)
 RTC to patient informed her to call er Pharmacy about getting multiple deliveries of the Bupropion .  Patient asked for refill on her test strips .  Patient then asked what happen to her Jardiance  refill.  Looks as if Jardiance  was discontinued.

## 2024-02-06 ENCOUNTER — Ambulatory Visit: Payer: Self-pay | Admitting: Student

## 2024-02-06 ENCOUNTER — Ambulatory Visit: Admitting: Student

## 2024-02-06 VITALS — BP 122/65 | HR 77 | Wt 221.2 lb

## 2024-02-06 DIAGNOSIS — W101XXA Fall (on)(from) sidewalk curb, initial encounter: Secondary | ICD-10-CM

## 2024-02-06 DIAGNOSIS — M48061 Spinal stenosis, lumbar region without neurogenic claudication: Secondary | ICD-10-CM

## 2024-02-06 MED ORDER — ONETOUCH ULTRA VI STRP: 1.0000 | ORAL_STRIP | 0 refills | Status: DC

## 2024-02-06 MED ORDER — LIDOCAINE 5 % EX PTCH: 1.0000 | MEDICATED_PATCH | CUTANEOUS | 0 refills | Status: AC

## 2024-02-06 MED ORDER — DICLOFENAC SODIUM 1 % EX GEL: 4.0000 g | Freq: Four times a day (QID) | CUTANEOUS | 2 refills | Status: AC

## 2024-02-06 MED ORDER — LIDOCAINE 5 % EX PTCH: 1.0000 | MEDICATED_PATCH | CUTANEOUS | 0 refills | Status: DC

## 2024-02-06 MED ORDER — DICLOFENAC SODIUM 1 % EX GEL: 4.0000 g | Freq: Four times a day (QID) | CUTANEOUS | 2 refills | Status: DC

## 2024-02-06 NOTE — Patient Instructions (Addendum)
 Thank you, [name] for allowing us  to provide your care today. Today we discussed:   - You can apply Voltaren  Gel up to 4 times a day  - You can apply lidocaine  patches, one patch for 24 hours   - Continue taking Hydrocodone  as needed up to 4 times a day - ADD Tylenol  650 mg every 6-8 hours as needed for pain control   If your symptoms do not improve 3-4 weeks, please come back and see us    I have ordered the following labs for you:  Lab Orders  No laboratory test(s) ordered today     Tests ordered today:  None   Referrals ordered today:   Referral Orders  No referral(s) requested today     I have ordered the following medication/changed the following medications:   Stop the following medications: Medications Discontinued During This Encounter  Medication Reason   diclofenac  Sodium (VOLTAREN ) 1 % GEL Reorder     Start the following medications: Meds ordered this encounter  Medications   lidocaine  (LIDODERM ) 5 %    Sig: Place 1 patch onto the skin daily. Remove & Discard patch within 12 hours or as directed by MD    Dispense:  30 patch    Refill:  0   diclofenac  Sodium (VOLTAREN ) 1 % GEL    Sig: Apply 4 g topically 4 (four) times daily.    Dispense:  100 g    Refill:  2     Follow up:  As needed if symptoms worsen      Remember:   Should you have any questions or concerns please call the internal medicine clinic at [phone].     Kara Pitts, DO [facility]

## 2024-02-06 NOTE — Telephone Encounter (Signed)
 Chief Complaint: Marvell Slider yesterday and hit right knee  Symptoms: Pain under right breast/rib, 10/10 pain level constant Pertinent Negatives: Patient denies hitting head, losing consciousness, open skin  Disposition: [x] Appointment(In office)  Additional Notes: Patient scheduled for an appointment today. This RN educated pt on new-worsening symptoms and when to call back/seek emergent care. Pt verbalized understanding and agrees to plan.     Copied from CRM [id number]. Topic: Clinical - Red Word Triage >> Feb 06, 2024 11:00 AM Tiffany H wrote: [facility] that prompted transfer to Nurse Triage: Patient called to advise that she fell yesterday. Patient advised that she tripped over the curb and fell flat on her left knee and left side. Did not hit head. She advised that she has bad pain under her breast and in knee from fall. No bruising or bleeding. Reason for Disposition  [1] MODERATE weakness (i.e., interferes with work, school, normal activities) AND [2] new-onset or worsening  Answer Assessment - Initial Assessment Questions Chief Complaint: Marvell Slider yesterday and hit right knee  Symptoms: Pain under right breast/rib, 10/10 pain level constant  Pertinent Negatives: Patient denies hitting head, losing consciousness, open skin  Protocols used: Falls and [facility]

## 2024-02-06 NOTE — Telephone Encounter (Signed)
  Chief Complaint: Med refill, supply refill Symptoms: NA Frequency: NA Pertinent Negatives: Patient denies NA Disposition: [] ED /[] Urgent Care (no appt availability in office) / [] Appointment(In office/virtual)/ []  [facility]/ [] Home Care/ [] Refused Recommended Disposition /[] [place] [facility]/ []  Follow-up with PCP Additional Notes:   Requesting refill of Jardiance  that ended 01/30/24.  Requesting One Touch Ultra test strips.    Communication Reason for CRM: Patient reports that [facility] continues to send her bupropion , but she does not need any refills at this time. She has received multiple extra bottles and is concerned it may cause issues with her insurance due to frequent shipments.   Communication Most Recent Primary Care Visit:  Provider: Sheree Dieter  [facility]  Visit Type: OPEN ESTABLISHED  Date: 12/21/2023 Medication: glucose blood (ONETOUCH ULTRA) test strip Has the patient contacted their pharmacy? No  (Agent: If no, request that the patient contact the pharmacy for the refill. If patient does not wish to contact the pharmacy document the reason why and proceed with request.)  (Agent: If yes, when and what did the pharmacy advise?) Is this the correct pharmacy for this prescription? Yes If no, delete pharmacy and type the correct one. This is the patient's preferred pharmacy:  [facility] [phone] - [place], [place] - [address] AT [facility] OF [address] [place] [address] Phone: [phone] Fax: [phone] Has the prescription been filled recently? No Is the patient out of the medication? Yes Has the patient been seen for an appointment in the last year OR does the patient have an upcoming appointment? Yes Can we respond through MyChart? Yes Agent: Please be advised that Rx refills may take up to 3 business days. We ask that you follow-up with your  pharmacy.   Communication Most Recent Primary Care Visit:  Provider: Sheree Dieter  Department: [facility]  Visit Type: OPEN ESTABLISHED  Date: 12/21/2023 Medication: Jardiance  Has the patient contacted their pharmacy? Yes (Agent: If no, request that the patient contact the pharmacy for the refill. If patient does not wish to contact the pharmacy document the reason why and proceed with request.) (Agent: If yes, when and what did the pharmacy advise?) She needs a new refill request. Is this the correct pharmacy for this prescription? Yes If no, delete pharmacy and type the correct one. This is the patient's preferred pharmacy:

## 2024-02-06 NOTE — Progress Notes (Unsigned)
   Established Patient Office Visit  Subjective   Patient ID: Kara Kelley, female    DOB: Jul 15, 1956  Age: 68 y.o. MRN: [id number]  No chief complaint on file.   HPI This is a 68 year old female living with a history stated below and presents today for an acute visit, after a fall yesterday. Please see problem based assessment and plan for additional details.  PMHx of uncontrolled diabetes, chronic myalgias, HTN   Past Medical History:  Diagnosis Date   Anemia    Anxiety    Arthritis    Asthma    Bacterial vaginosis 05/16/2022   Bunion    Callus    Candida vaginitis 09/16/2022   Cholelithiasis without obstruction 04/11/2022   Chronic pain    Cocaine abuse (HCC)    COPD (chronic obstructive pulmonary disease) (HCC)    Corns and callosities    Degenerative joint disease    Depression    Diabetes mellitus    Endometrial polyp    ETOH abuse    Gall stones    Gallbladder calculus with acute cholecystitis and no obstruction 04/11/2022   GERD (gastroesophageal reflux disease)    Headache    history of Migraines   Hepatitis C    Hep C   History of adenomatous polyp of colon 04/11/2022   History of endometrial ablation 02/06/2013   Hyperlipidemia    Hypertension    Insomnia    Spondylolisthesis of lumbar region    Substance abuse (HCC)    alcoholism   Tuberculosis 1985   Wears dentures    Wears glasses      ROS    Objective:     There were no vitals taken for this visit. BP Readings from Last 3 Encounters:  12/21/23 (!) 159/64  12/15/23 127/80  11/22/23 (!) 150/82   Wt Readings from Last 3 Encounters:  12/21/23 226 lb 1.6 oz (102.6 kg)  12/15/23 223 lb (101.2 kg)  11/22/23 218 lb 14.4 oz (99.3 kg)   SpO2 Readings from Last 3 Encounters:  12/21/23 99%  12/15/23 91%  11/22/23 100%      Physical Exam   No results found for any visits on 02/06/24.  {Labs (Optional):23779}  The 10-year ASCVD risk score (Arnett DK, et al., 2019) is: 25.5%     Assessment & Plan:  Mechanical Fall - Reports she fell yesterday 04/28 around the afternoon time, states that she tripped over the curb on the concrete. She fell on her R side, braced her fall with knees. No head trauma. No LOC. Reports R sided lower ribs pain, constant. Deep inhalation and cough makes the pain worse. She has been taking her Hydracodone/acetaminophen  10-325 mg wo much relief. Per exam, no ecchymosis or edema noted R antero-lateral chest. TTP along Ribs 9-11      Problem List Items Addressed This Visit   None   No follow-ups on file.    Lanney Pitts, DO

## 2024-02-06 NOTE — Telephone Encounter (Signed)
 duplicate

## 2024-02-07 ENCOUNTER — Telehealth: Payer: Self-pay

## 2024-02-07 ENCOUNTER — Other Ambulatory Visit: Payer: Self-pay | Admitting: Student

## 2024-02-07 DIAGNOSIS — E1165 Type 2 diabetes mellitus with hyperglycemia: Secondary | ICD-10-CM

## 2024-02-07 DIAGNOSIS — W101XXA Fall (on)(from) sidewalk curb, initial encounter: Secondary | ICD-10-CM | POA: Insufficient documentation

## 2024-02-07 DIAGNOSIS — W19XXXD Unspecified fall, subsequent encounter: Secondary | ICD-10-CM | POA: Insufficient documentation

## 2024-02-07 NOTE — Assessment & Plan Note (Signed)
 This is a 69 year old female who presents today after a fall that she had yesterday 4/28 roughly around afternoon.  Patient reports that she was outside, she tripped over the curb, fell on the concrete.  Patient fell on her right side, she tried to brace her fall with her knees.  Denies any head trauma.  Denies any loss of conscious.  Patient reports that she has right sided lower ribs pain that is constant, worse with deep inhalation and cough.  Patient reports that she has been taking her home medication hydrocodone , acetaminophen  10-325 mg without much relief.  Per exam, she has no ecchymosis or edema that is noted on the right anterolateral chest.  Chest exam shows lungs are clear to auscultation bilaterally without any collapsing of the lung.  She has point tenderness along the anterior right ribs 9 through 11.  Her vital signs are stable.  I have low suspicion for rib fracture at this time.  Patient is advised to do the following: -Continue taking your home hydrocodone /acetaminophen  10-325 mg as needed 4 times a day -In addition, she can take Tylenol  650 mg as needed every 8 hours. -Otherwise she can try some topical treatments such as Voltaren  gel and lidocaine  patches -If her symptoms do not improve in the next 3 to 4 weeks, patient is advised to come back and we can x-ray her ribs.

## 2024-02-07 NOTE — Telephone Encounter (Signed)
 Prior Authorization for patient (Lidoderm  5% patches) came through on cover my meds was submitted with last office notes awaiting approval or denial.  KEY:[id number]

## 2024-02-08 ENCOUNTER — Telehealth: Payer: Self-pay | Admitting: *Deleted

## 2024-02-08 NOTE — Telephone Encounter (Signed)
 RTC to [facility].Aaron Aas Asked and responded to additional questions.  To be resubmitted Case # [id number].  Awaiting determination from [facility].   Copied from CRM [id number]. Topic: General - Other >> Feb 08, 2024  9:26 AM Shelby Dessert H wrote: Reason for CRM: Shy from [employer] called and has some clinical questions that she would like to ask about a prior authorization, Shy callback number is [phone] case number is [id number]. If a respond is not received by May 3 then the case may be denied.   May from the clinic informed me that Jada was in clinic at the moment. Sending CRM.

## 2024-02-08 NOTE — Telephone Encounter (Signed)
 Patient Name: Kara Kelley Patient DOB: 1956/08/02 Patient ID: [id number] Status of Request: Deny Medication Name: Lidoderm  Dis 5% Patch GPI/NDC: [id number] Decision Notes: LIDODERM  DIS 5% PATCH is not covered. The requested medication with the submitted Generic Product Identifier (GPI)/National Drug Code [facility]) is not properly listed with the Food and Drug Administration (FDA) and does not meet regulatory requirements that would constitute a Part D-eligible drug. Therefore, the medication is not covered under your Part D prescription drug plan

## 2024-02-08 NOTE — Telephone Encounter (Signed)
 I received additional questions via fax from the patients insurance. The from has been completed and faxed back to her insurance.

## 2024-02-09 NOTE — Progress Notes (Signed)
 Internal Medicine Clinic Attending  Case discussed with the resident at the time of the visit.  We reviewed the resident's history and exam and pertinent patient test results.  I agree with the assessment, diagnosis, and plan of care documented in the resident's note.

## 2024-02-14 NOTE — Addendum Note (Signed)
 Addended by: Manfred Seed on: 02/14/2024 03:02 PM   Modules accepted: Orders

## 2024-02-15 ENCOUNTER — Telehealth: Payer: Self-pay | Admitting: Dietician

## 2024-02-15 NOTE — Telephone Encounter (Signed)
 Call to patient, we had a poor connection, but I think she said she has already gotten her CGM and the company does not need the paperwork. I informed her that she can get her CGM supplies at her pharmacy as well.

## 2024-02-19 ENCOUNTER — Telehealth: Payer: Self-pay | Admitting: Dietician

## 2024-02-19 NOTE — Telephone Encounter (Signed)
 Abe Abed, Please see message below in reference to a CGM form being completed and faxed back.    Copied from CRM [id number]. Topic: General - Other >> Feb 16, 2024  4:01 PM [name] wrote: Reason for CRM: Bless calling from [facility]  following up on status a fax    sent on 02/08/24 for ,durable medical equipment [employer] glucose monitor call back number for [facility]  : [phone] option 3  [facility] will be refaxing the request to [phone]

## 2024-02-19 NOTE — Telephone Encounter (Signed)
 Call to [facility], they are refaxing the form.

## 2024-02-21 ENCOUNTER — Other Ambulatory Visit: Payer: Self-pay | Admitting: Student

## 2024-02-21 ENCOUNTER — Ambulatory Visit: Payer: Self-pay

## 2024-02-21 DIAGNOSIS — I10 Essential (primary) hypertension: Secondary | ICD-10-CM

## 2024-02-21 MED ORDER — LOSARTAN POTASSIUM-HCTZ 100-25 MG PO TABS: 1.0000 | ORAL_TABLET | Freq: Every day | ORAL | 3 refills | Status: DC

## 2024-02-21 NOTE — Telephone Encounter (Signed)
 Chief Complaint: fall Symptoms: hi[ and leg pain Frequency: x 2 weeks Pertinent Negatives: Patient denies  Disposition: [] ED /[] Urgent Care (no appt availability in office) / [x] Appointment(In office/virtual)/ []  [facility]/ [] Home Care/ [] Refused Recommended Disposition /[]  Mobile Bus/ []  Follow-up with PCP Additional Notes: pt states that she wasn't paying attention and she tripped over a curve and second thing it was raining and she slipped in her building. States she is having pain in her left hip and both legs since the fall. States pain 10/10 and taking hydrocodone . Pt is wanting an earlier appt but nothing available.   Copied from CRM [id number]. Topic: Clinical - Red Word Triage >> Feb 21, 2024 12:39 PM Adrianna P wrote: Red Word that prompted transfer to Nurse Triage: fell twice in one week , pain in thigh Reason for Disposition  [1] MODERATE pain (e.g., interferes with normal activities) AND [2] present > 3 days  Answer Assessment - Initial Assessment Questions 1. ONSET: "When did the muscle aches or body pains start?"      About a week ago 2. LOCATION: "What part of your body is hurting?" (e.g., entire body, arms, legs)      Both legs and left hip 3. SEVERITY: "How bad is the pain?" (Scale 1-10; or mild, moderate, severe)   - MILD (1-3): doesn't interfere with normal activities    - MODERATE (4-7): interferes with normal activities or awakens from sleep    - SEVERE (8-10):  excruciating pain, unable to do any normal activities      severe 4. CAUSE: "What do you think is causing the pains?"     falls 5. FEVER: "Have you been having fever?"     no 6. OTHER SYMPTOMS: "Do you have any other symptoms?" (e.g., chest pain, weakness, rash, cold or flu symptoms, weight loss)      Weakness in legs.  Protocols used: Muscle Aches and Body Pain-A-AH

## 2024-02-21 NOTE — Telephone Encounter (Signed)
 Copied from CRM [id number]. Topic: Clinical - Medication Refill >> Feb 21, 2024  3:37 PM Shelby Dessert H wrote: Medication: losartan -hydrochlorothiazide (HYZAAR) 100-25 MG tablet  Has the patient contacted their pharmacy? Yes (Agent: If no, request that the patient contact the pharmacy for the refill. If patient does not wish to contact the pharmacy document the reason why and proceed with request.) (Agent: If yes, when and what did the pharmacy advise?)  This is the patient's preferred pharmacy:  [facility] [phone] - [place], [place] - [address] AT [facility] [address] [place] [address] Phone: [phone]  Is this the correct pharmacy for this prescription? Yes If no, delete pharmacy and type the correct one.   Has the prescription been filled recently? Yes  Is the patient out of the medication? Yes  Has the patient been seen for an appointment in the last year OR does the patient have an upcoming appointment? Yes  Can we respond through MyChart? Yes  Agent: Please be advised that Rx refills may take up to 3 business days. We ask that you follow-up with your pharmacy.

## 2024-02-21 NOTE — Telephone Encounter (Signed)
 Pt's appt is 5/20 with Dr Lydia Sams.

## 2024-02-22 ENCOUNTER — Telehealth: Payer: Self-pay | Admitting: *Deleted

## 2024-02-22 NOTE — Telephone Encounter (Signed)
 Fax with clinical notes was put in Doctors box yesterday for signature. We can ask Hme if it was faxed.

## 2024-02-22 NOTE — Telephone Encounter (Signed)
 Copied from CRM [id number]. Topic: Clinical - Order For Equipment >> Feb 21, 2024  4:21 PM Danelle Dunning F wrote: Reason for CRM:   Caller: Pam  Calling From: [facility]  Calling to confirm if their fax for a request for glucose supplies and applicable office notes was received and sent back with the patient's provider signature.   Fax was sent over on 02/14/2024  Contact Information:  Phone: [phone] Fax Number: [phone]

## 2024-02-23 NOTE — Telephone Encounter (Signed)
 Please see messages below in reference to the pt's Paperwork per Donna's message.  Copied from CRM [id number]. Topic: Clinical - Request for Lab/Test Order >> Feb 23, 2024 10:26 AM Tilton Fontan wrote: Reason for CRM: Sean with [facility] called to follow up on request for DME. Declined to file a complaint due to turnaround time being over 10 days. Associated CRMs: #[id number]

## 2024-02-26 ENCOUNTER — Other Ambulatory Visit: Payer: Self-pay | Admitting: Student

## 2024-02-26 DIAGNOSIS — K219 Gastro-esophageal reflux disease without esophagitis: Secondary | ICD-10-CM

## 2024-02-26 NOTE — Telephone Encounter (Signed)
 I have not seen the paperwork since I put it in the red team box in the old office. Maybe we can ask Doctors on the red team if they have seen it?

## 2024-02-27 ENCOUNTER — Encounter: Payer: Self-pay | Admitting: Student

## 2024-02-27 ENCOUNTER — Ambulatory Visit (HOSPITAL_COMMUNITY)
Admission: RE | Admit: 2024-02-27 | Discharge: 2024-02-27 | Disposition: A | Discharge status: Home / Self Care | Source: Ambulatory Visit | Attending: Family Medicine | Admitting: Family Medicine

## 2024-02-27 ENCOUNTER — Ambulatory Visit (INDEPENDENT_AMBULATORY_CARE_PROVIDER_SITE_OTHER): Payer: Self-pay | Admitting: Student

## 2024-02-27 VITALS — BP 147/87 | HR 90 | Temp 98.2°F | Ht 59.0 in | Wt 219.0 lb

## 2024-02-27 DIAGNOSIS — M160 Bilateral primary osteoarthritis of hip: Secondary | ICD-10-CM | POA: Insufficient documentation

## 2024-02-27 DIAGNOSIS — Z981 Arthrodesis status: Secondary | ICD-10-CM | POA: Diagnosis not present

## 2024-02-27 DIAGNOSIS — E1122 Type 2 diabetes mellitus with diabetic chronic kidney disease: Secondary | ICD-10-CM

## 2024-02-27 DIAGNOSIS — Z79899 Other long term (current) drug therapy: Secondary | ICD-10-CM | POA: Insufficient documentation

## 2024-02-27 DIAGNOSIS — I10 Essential (primary) hypertension: Secondary | ICD-10-CM

## 2024-02-27 DIAGNOSIS — K219 Gastro-esophageal reflux disease without esophagitis: Secondary | ICD-10-CM | POA: Diagnosis not present

## 2024-02-27 DIAGNOSIS — W101XXA Fall (on)(from) sidewalk curb, initial encounter: Secondary | ICD-10-CM | POA: Insufficient documentation

## 2024-02-27 DIAGNOSIS — N1832 Chronic kidney disease, stage 3b: Secondary | ICD-10-CM | POA: Diagnosis not present

## 2024-02-27 DIAGNOSIS — E785 Hyperlipidemia, unspecified: Secondary | ICD-10-CM | POA: Diagnosis not present

## 2024-02-27 DIAGNOSIS — E1165 Type 2 diabetes mellitus with hyperglycemia: Secondary | ICD-10-CM | POA: Diagnosis not present

## 2024-02-27 DIAGNOSIS — Z794 Long term (current) use of insulin: Secondary | ICD-10-CM | POA: Diagnosis not present

## 2024-02-27 DIAGNOSIS — W19XXXD Unspecified fall, subsequent encounter: Secondary | ICD-10-CM

## 2024-02-27 DIAGNOSIS — I129 Hypertensive chronic kidney disease with stage 1 through stage 4 chronic kidney disease, or unspecified chronic kidney disease: Secondary | ICD-10-CM

## 2024-02-27 DIAGNOSIS — M25561 Pain in right knee: Secondary | ICD-10-CM | POA: Diagnosis present

## 2024-02-27 DIAGNOSIS — M818 Other osteoporosis without current pathological fracture: Secondary | ICD-10-CM

## 2024-02-27 LAB — POCT GLYCOSYLATED HEMOGLOBIN (HGB A1C): Hemoglobin A1C: 8.1 % — AB (ref 4.0–5.6)

## 2024-02-27 LAB — GLUCOSE, CAPILLARY: Glucose-Capillary: 187 mg/dL — ABNORMAL HIGH (ref 70–99)

## 2024-02-27 MED ORDER — MOUNJARO 2.5 MG/0.5ML ~~LOC~~ SOAJ: 2.5000 mg | SUBCUTANEOUS | 3 refills | Status: DC

## 2024-02-27 MED ORDER — ATORVASTATIN CALCIUM 10 MG PO TABS: 10.0000 mg | ORAL_TABLET | Freq: Every day | ORAL | 11 refills | Status: DC

## 2024-02-27 MED ORDER — METFORMIN HCL ER 500 MG PO TB24: 1000.0000 mg | ORAL_TABLET | Freq: Two times a day (BID) | ORAL | 11 refills | Status: AC

## 2024-02-27 MED ORDER — BUPROPION HCL ER (XL) 300 MG PO TB24: 300.0000 mg | ORAL_TABLET | Freq: Two times a day (BID) | ORAL | 1 refills | Status: DC

## 2024-02-27 MED ORDER — ALENDRONATE SODIUM 10 MG PO TABS: 10.0000 mg | ORAL_TABLET | Freq: Every day | ORAL | 11 refills | Status: DC

## 2024-02-27 MED ORDER — LOSARTAN POTASSIUM-HCTZ 100-25 MG PO TABS: 1.0000 | ORAL_TABLET | Freq: Every day | ORAL | 3 refills | Status: AC

## 2024-02-27 MED ORDER — LOSARTAN POTASSIUM-HCTZ 100-25 MG PO TABS: 1.0000 | ORAL_TABLET | Freq: Every day | ORAL | 3 refills | Status: DC

## 2024-02-27 MED ORDER — OMEPRAZOLE 40 MG PO CPDR: DELAYED_RELEASE_CAPSULE | ORAL | 3 refills | Status: DC

## 2024-02-27 MED ORDER — EMPAGLIFLOZIN 10 MG PO TABS
10.0000 mg | ORAL_TABLET | Freq: Every day | ORAL | 11 refills | Status: AC
Start: 2024-02-27 — End: ?

## 2024-02-27 NOTE — Patient Instructions (Addendum)
 Kara Kelley you for allowing me to take part in your care today.  Here are your instructions.  1. Here are all of your medications    Type 2 diabetes mellitus: Tresiba  15 units twice daily, jardiance  10 mg daily, metformin  1000 mg daily, Humalog  5 units 3 times daily Vitamin D  deficiency: Vitamin D  supplementation Hypertension: Losartan -HCTZ 100-25 mg daily GERD: Omeprazole  40 mg daily, Linzess  145 mcg daily Insomnia: Trazodone  150 mg nightly as needed Pain: Tylenol , Voltaren  gel, Cymbalta  60 mg daily Hyperlipidemia: Atorvastatin  10 mg daily Mood disorder: Wellbutrin  300 mg twice daily, Atarax  25 mg every 8 hours Asthma: Advair, albuterol  Osteoporosis: Fosamax  10 mg daily Hyperlipidemia: Lipitor 10 mg daily  2.  I am getting x-rays for you.  Please take Voltaren  gel and lidocaine  patches.  Please do stretches.  I have referred you to physical therapy.  You can take Tylenol  1000 mg every 8 hours.  On bad days you can take Advil .  3.  Please come back in 1 month.  We can discuss your insulin  at that time.  Your A1c was 8.1 today.  4. I hope that you are doing well. I am messaging you in regards on how to ramp up the metformin . Below are the instructions.   Week 1: Start with metformin  500 mg (1 tablet) with breakfast.   Week 2: Continue to do 500 mg (1 tablet) with breakfast and start taking 500 mg (1 tablet) with dinner.    Week 3: Increase your morning dose to 1000 mg (2 tablets) with breakfast and continue 500 mg (1 tablet) with dinner.   Week 4 and on: Please do 1000 mg (2 tablets) in the morning with breakfast and 1000 mg (2 tablets) with dinner.     Thank you, Dr. Lydia Sams  If you have any other questions please contact the internal medicine clinic at [phone] If it is after hours, please call the [facility] at [phone] and then ask the person who picks up for the resident on call.

## 2024-02-27 NOTE — Assessment & Plan Note (Signed)
 Patient has a large medication list.  She has polypharmacy.  Will need to discuss with patient about her medication list and see what medication she can potentially stop.  Trazodone  can be a fall risk for patient.  I also am having concerns of her remembering her medications.  She never brings them into the office nor does she ever keep a list with her.  Her therapist was wondering if she may have a early onset dementia.  At her next visit we will do a MoCA score.  Type 2 diabetes mellitus: Tresiba  15 units BID, jardiance  10 mg daily, metformin  1000 mg daily, Humalog  5 units 3 times daily, Mounjaro  2.5 mg weekly Vitamin D  deficiency: Vitamin D  supplementation Hypertension: Losartan -HCTZ 100-25 mg daily GERD: Omeprazole  40 mg daily, Linzess  145 mcg daily Insomnia: Trazodone  50 mg nightly as needed Pain: Tylenol , Voltaren  gel, Cymbalta  60 mg daily Hyperlipidemia: Atorvastatin  10 mg daily Mood disorder: Wellbutrin  300 mg twice daily, Atarax  25 mg every 8 hours Asthma: Advair, albuterol  Osteoporosis: Fosamax  10 mg daily Hyperlipidemia: Lipitor 10 mg daily

## 2024-02-27 NOTE — Assessment & Plan Note (Addendum)
 Patient has a past medical history of uncontrolled diabetes.  A1c today is 8.1.  She reports that she is not adherent to her Tresiba .  She takes it when she wants to.  She also reports that she does not eat all the time, but does inject her short acting.  She states she did have some episodes of low blood sugars, but knows how to treat those.  Today I took the time to discuss the importance of diabetes management.  She understood this.  I told her it is important to check her sugars regularly as she has not been doing so.  I gave her an entire list of her medications.  Patient does not know which medicine she is taking it when she is taking them.  She does not check her sugars regularly.  I took the time to make sure that she understands that she needs to check her sugars and take her medicines as prescribed.  She understood this.  Plan: - Continue Tresiba  15 units twice daily - Continue Jardiance  10 mg daily - Ramp-up metformin  to 1000 mg twice daily - Continue Humalog  5 units 3 times daily - Start Mounjaro  2.5 mg weekly - Return in 1 month with sugar logs

## 2024-02-27 NOTE — Progress Notes (Signed)
 Internal Medicine Clinic Attending  Case discussed with the resident at the time of the visit.  We reviewed the resident's history and exam and pertinent patient test results.  I agree with the assessment, diagnosis, and plan of care documented in the resident's note.

## 2024-02-27 NOTE — Assessment & Plan Note (Signed)
 Patient has a past medical history of GERD.  Well-controlled with omeprazole .  Refilled omeprazole  today.  Plan: - Continue omeprazole  40 mg daily

## 2024-02-27 NOTE — Progress Notes (Signed)
 CC: Fall follow up  HPI:  [name] is a 68 y.o. female with a past medical history of hypertension, type 2 diabetes who presents for acute visit after having a fall.  Please see assessment and plan for full HPI.  Medications: Type 2 diabetes mellitus: Tresiba  15 units BID, jardiance  10 mg daily, metformin  1000 mg daily, Humalog  5 units 3 times daily, Mounjaro  2.5 mg weekly Vitamin D  deficiency: Vitamin D  supplementation Hypertension: Losartan -HCTZ 100-25 mg daily GERD: Omeprazole  40 mg daily, Linzess  145 mcg daily Insomnia: Trazodone  50 mg nightly as needed Pain: Tylenol , Voltaren  gel, Cymbalta  60 mg daily Hyperlipidemia: Atorvastatin  10 mg daily Mood disorder: Wellbutrin  300 mg twice daily, Atarax  25 mg every 8 hours Asthma: Advair, albuterol  Osteoporosis: Fosamax  10 mg daily Hyperlipidemia: Lipitor 10 mg daily  Past Medical History:  Diagnosis Date   Anemia    Anxiety    Arthritis    Asthma    Bacterial vaginosis 05/16/2022   Bunion    Callus    Candida vaginitis 09/16/2022   Cholelithiasis without obstruction 04/11/2022   Chronic pain    Cocaine abuse (HCC)    COPD (chronic obstructive pulmonary disease) (HCC)    Corns and callosities    Degenerative joint disease    Depression    Diabetes mellitus    Endometrial polyp    ETOH abuse    Gall stones    Gallbladder calculus with acute cholecystitis and no obstruction 04/11/2022   GERD (gastroesophageal reflux disease)    Headache    history of Migraines   Hepatitis C    Hep C   History of adenomatous polyp of colon 04/11/2022   History of endometrial ablation 02/06/2013   Hyperlipidemia    Hypertension    Insomnia    Spondylolisthesis of lumbar region    Substance abuse (HCC)    alcoholism   Tuberculosis 1985   Wears dentures    Wears glasses      Current Outpatient Medications:    tirzepatide  (MOUNJARO ) 2.5 MG/0.5ML Pen, Inject 2.5 mg into the skin once a week., Disp: 0.5 mL, Rfl: 3    Accu-Chek FastClix Lancets MISC, Use as directed to check blood sugars 2 times per day dx: e11.65, Disp: 102 each, Rfl: prn   Accu-Chek Softclix Lancets lancets, Use as instructed to check blood sugars twice daily E11.69, Disp: 100 each, Rfl: 2   acetaminophen  (TYLENOL ) 325 MG tablet, Take 1-2 tablets (325-650 mg total) by mouth every 4 (four) hours as needed for mild pain., Disp: , Rfl:    albuterol  (VENTOLIN  HFA) 108 (90 Base) MCG/ACT inhaler, Inhale 1-2 puffs into the lungs every 6 (six) hours as needed for wheezing or shortness of breath., Disp: 18 g, Rfl: 2   alendronate  (FOSAMAX ) 10 MG tablet, Take 1 tablet (10 mg total) by mouth daily before breakfast. Take with a full glass of water on an empty stomach., Disp: 30 tablet, Rfl: 11   atorvastatin  (LIPITOR) 10 MG tablet, Take 1 tablet (10 mg total) by mouth daily., Disp: 30 tablet, Rfl: 11   Blood Glucose Monitoring Suppl (ONETOUCH VERIO REFLECT) w/Device KIT, Use to check blood sugar up to 3 times a day, Disp: 1 kit, Rfl: 0   buPROPion  (WELLBUTRIN  XL) 300 MG 24 hr tablet, Take 1 tablet (300 mg total) by mouth in the morning and at bedtime., Disp: 180 tablet, Rfl: 1   diclofenac  Sodium (VOLTAREN ) 1 % GEL, Apply 4 g topically 4 (four) times daily., Disp: 100  g, Rfl: 2   DULoxetine  (CYMBALTA ) 60 MG capsule, Take 1 capsule (60 mg total) by mouth daily., Disp: 90 capsule, Rfl: 3   empagliflozin  (JARDIANCE ) 10 MG TABS tablet, Take 1 tablet (10 mg total) by mouth daily., Disp: 30 tablet, Rfl: 11   fluticasone  (FLONASE ) 50 MCG/ACT nasal spray, Place 1 spray into both nostrils daily., Disp: 16 g, Rfl: 2   fluticasone -salmeterol (ADVAIR) 250-50 MCG/ACT AEPB, INHALE 1 PUFF BY MOUTH INTO LUNGS TWICE DAILY IN THE MORNING AND AT BEDTIME, Disp: 60 each, Rfl: 2   glucose blood (ONETOUCH ULTRA) test strip, 1 each by Other route See admin instructions., Disp: 100 strip, Rfl: 0   hydrOXYzine  (ATARAX ) 25 MG tablet, Take 1 tablet (25 mg total) by mouth every 8 (eight)  hours as needed for anxiety., Disp: 30 tablet, Rfl: 1   insulin  degludec (TRESIBA  FLEXTOUCH) 100 UNIT/ML FlexTouch Pen, ADMINISTER 15 UNITS UNDER THE SKIN TWICE DAILY, Disp: 27 mL, Rfl: 0   insulin  lispro (HUMALOG  KWIKPEN) 100 UNIT/ML KwikPen, ADMINISTER 5 UNITS UNDER THE SKIN THREE TIMES DAILY WITH MEALS, Disp: 3 mL, Rfl: 3   Insulin  Pen Needle (PEN NEEDLES) 31G X 8 MM MISC, UAD, Disp: 100 each, Rfl: 0   Insulin  Syringe-Needle U-100 (INSULIN  SYRINGE .3CC/30GX5/16") 30G X 5/16" 0.3 ML MISC, Use to inject Levemir  twice daily., Disp: 100 each, Rfl: 2   Iron , Ferrous Sulfate , 325 (65 Fe) MG TABS, Take 325 mg by mouth daily., Disp: 30 tablet, Rfl: 2   Lancet Device MISC, 1 each by Does not apply route in the morning, at noon, and at bedtime. May substitute to any manufacturer covered by patient's insurance., Disp: 100 each, Rfl: 0   lidocaine  (LIDODERM ) 5 %, Place 1 patch onto the skin daily. Remove & Discard patch within 12 hours or as directed by MD, Disp: 30 patch, Rfl: 0   LINZESS  145 MCG CAPS capsule, Take 145 mcg by mouth daily before breakfast. , Disp: , Rfl:    losartan -hydrochlorothiazide (HYZAAR) 100-25 MG tablet, Take 1 tablet by mouth daily., Disp: 90 tablet, Rfl: 3   metFORMIN  (GLUCOPHAGE -XR) 500 MG 24 hr tablet, Take 2 tablets (1,000 mg total) by mouth 2 (two) times daily with a meal., Disp: 120 tablet, Rfl: 11   Multiple Vitamins-Minerals (MULTIVITAMIN WITH MINERALS) tablet, Take 1 tablet by mouth daily. Alive, Disp: , Rfl:    omeprazole  (PRILOSEC) 40 MG capsule, TAKE 1 CAPSULE(40 MG) BY MOUTH DAILY BEFORE BREAKFAST, Disp: 90 capsule, Rfl: 3   traZODone  (DESYREL ) 50 MG tablet, TAKE 3 TABLETS(150 MG) BY MOUTH AT BEDTIME AS NEEDED FOR SLEEP, Disp: 90 tablet, Rfl: 0  Review of Systems:    MSK: Patient endorses bilateral hip pain  Physical Exam:  Vitals:   02/27/24 0900 02/27/24 0959  BP: (!) 146/85 (!) 147/87  Pulse: 92 90  Temp: 98.2 F (36.8 C)   TempSrc: Oral   SpO2: 100%    Weight: 219 lb (99.3 kg)   Height: 4\' 11"  (1.499 m)    General: Patient is sitting comfortably in the room  Head: Normocephalic, atraumatic  Cardio: Regular rate and rhythm, no murmurs, rubs or gallops Pulmonary: Clear to ausculation bilaterally with no rales, rhonchi, and crackles  MSK: Bilateral hips with tenderness to palpation, no obvious deformities.  4/5 strength noted to hip flexion and extension.  Knee strength 5 out of 5 for knee flexion and extension.  Ankle strength 5 out of 5 on dorsiflexion and plantarflexion.   Assessment & Plan:  Essential hypertension Patient has a past medical history of hypertension.  She has had multiple visits where she has had elevated blood pressures.  She reports she has been out of her medications.  Her blood pressure today is 146/85.  She has been out of her medications for about 2 weeks now.  She is unsure how to refill her medications.  I instructed her on how to do it.    Plan: - Refill losartan -HCTZ 100-25 mg daily - Follow-up in 1 month - Obtain BMP at that time  Gastroesophageal reflux disease Patient has a past medical history of GERD.  Well-controlled with omeprazole .  Refilled omeprazole  today.  Plan: - Continue omeprazole  40 mg daily  Uncontrolled type 2 diabetes mellitus with hyperglycemia (HCC) Patient has a past medical history of uncontrolled diabetes.  A1c today is 8.1.  She reports that she is not adherent to her Tresiba .  She takes it when she wants to.  She also reports that she does not eat all the time, but does inject her short acting.  She states she did have some episodes of low blood sugars, but knows how to treat those.  Today I took the time to discuss the importance of diabetes management.  She understood this.  I told her it is important to check her sugars regularly as she has not been doing so.  I gave her an entire list of her medications.  Patient does not know which medicine she is taking it when she is taking  them.  She does not check her sugars regularly.  I took the time to make sure that she understands that she needs to check her sugars and take her medicines as prescribed.  She understood this.  Plan: - Continue Tresiba  15 units twice daily - Continue Jardiance  10 mg daily - Ramp-up metformin  to 1000 mg twice daily - Continue Humalog  5 units 3 times daily - Start Mounjaro  2.5 mg weekly - Return in 1 month with sugar logs  Falls, subsequent encounter Patient presents today with bilateral hip pain after falling again.  She states she slipped on a rainy day and fell on her left hip.  She since has had aching and throbbing type pains.  She was seen in the clinic for this, and was told to get Voltaren  gel and lidocaine  patches, but never did.  She states she has tried Tylenol  with no improvement.  She states it is worse when she stands up and starts moving, but gets better as she starts moving.  On my exam she does have some tenderness noted to the left hip.  She does have 4/5 strength noted on bilateral hips at hip flexion.  5/5 strength noted to knee flexion and knee extension.  5/5 strength noted to ankle dorsiflexion and plantarflexion.  No obvious bruising or rashes noted.  No obvious deformities.  Likely this is a myalgia from her fall.  I do not think she has a fracture however given history of osteoporosis we will evaluate to rule out any fracture.   Plan: - Conservative management with Tylenol , lidocaine  patches, Voltaren  gel, Advil  on bad days - Refer to physical therapy - Pelvic x-ray pending  Hyperlipidemia Patient has a past medical history of hyperlipidemia.  She has not been adherent to her atorvastatin .  She did not even know she was prescribed this medicine.  Plan: - Counseled patient on the importance of taking her statin - Patient agreed to take her statin  Polypharmacy Patient has a  large medication list.  She has polypharmacy.  Will need to discuss with patient about her  medication list and see what medication she can potentially stop.  Trazodone  can be a fall risk for patient.  I also am having concerns of her remembering her medications.  She never brings them into the office nor does she ever keep a list with her.  Her therapist was wondering if she may have a early onset dementia.  At her next visit we will do a MoCA score.  Type 2 diabetes mellitus: Tresiba  15 units BID, jardiance  10 mg daily, metformin  1000 mg daily, Humalog  5 units 3 times daily, Mounjaro  2.5 mg weekly Vitamin D  deficiency: Vitamin D  supplementation Hypertension: Losartan -HCTZ 100-25 mg daily GERD: Omeprazole  40 mg daily, Linzess  145 mcg daily Insomnia: Trazodone  50 mg nightly as needed Pain: Tylenol , Voltaren  gel, Cymbalta  60 mg daily Hyperlipidemia: Atorvastatin  10 mg daily Mood disorder: Wellbutrin  300 mg twice daily, Atarax  25 mg every 8 hours Asthma: Advair, albuterol  Osteoporosis: Fosamax  10 mg daily Hyperlipidemia: Lipitor 10 mg daily  Patient discussed with Dr. Teola Felling, DO PGY-2 Internal Medicine Resident

## 2024-02-27 NOTE — Assessment & Plan Note (Signed)
 Patient presents today with bilateral hip pain after falling again.  She states she slipped on a rainy day and fell on her left hip.  She since has had aching and throbbing type pains.  She was seen in the clinic for this, and was told to get Voltaren  gel and lidocaine  patches, but never did.  She states she has tried Tylenol  with no improvement.  She states it is worse when she stands up and starts moving, but gets better as she starts moving.  On my exam she does have some tenderness noted to the left hip.  She does have 4/5 strength noted on bilateral hips at hip flexion.  5/5 strength noted to knee flexion and knee extension.  5/5 strength noted to ankle dorsiflexion and plantarflexion.  No obvious bruising or rashes noted.  No obvious deformities.  Likely this is a myalgia from her fall.  I do not think she has a fracture however given history of osteoporosis we will evaluate to rule out any fracture.   Plan: - Conservative management with Tylenol , lidocaine  patches, Voltaren  gel, Advil  on bad days - Refer to physical therapy - Pelvic x-ray pending

## 2024-02-27 NOTE — Assessment & Plan Note (Signed)
 Patient has a past medical history of hypertension.  She has had multiple visits where she has had elevated blood pressures.  She reports she has been out of her medications.  Her blood pressure today is 146/85.  She has been out of her medications for about 2 weeks now.  She is unsure how to refill her medications.  I instructed her on how to do it.    Plan: - Refill losartan -HCTZ 100-25 mg daily - Follow-up in 1 month - Obtain BMP at that time

## 2024-02-27 NOTE — Assessment & Plan Note (Signed)
 Patient has a past medical history of hyperlipidemia.  She has not been adherent to her atorvastatin .  She did not even know she was prescribed this medicine.  Plan: - Counseled patient on the importance of taking her statin - Patient agreed to take her statin

## 2024-02-27 NOTE — Telephone Encounter (Signed)
 I am seeing her in the office today.  I will ask her what she needs DME for.

## 2024-02-27 NOTE — Telephone Encounter (Signed)
 The paperwork is in the red team box under miscellaneous paperwork. Forms is for her diabetic supply. I forward the message to Abe Abed, so she can called the company back. Paperwork is in the work room.

## 2024-02-28 NOTE — Telephone Encounter (Signed)
 Forms has been completed and faxed back to [employer], transmission went through. Forms will be scanned in pt chart under media.

## 2024-03-05 ENCOUNTER — Telehealth: Payer: Self-pay | Admitting: *Deleted

## 2024-03-05 ENCOUNTER — Other Ambulatory Visit: Payer: Self-pay | Admitting: Student

## 2024-03-05 ENCOUNTER — Ambulatory Visit: Payer: Self-pay | Admitting: Student

## 2024-03-05 DIAGNOSIS — G4700 Insomnia, unspecified: Secondary | ICD-10-CM

## 2024-03-05 NOTE — Telephone Encounter (Signed)
 I called pt again- no answer; left message on self-identified vm that she needs to do PT per Dr Lydia Sams. And to call the office for any questions.

## 2024-03-05 NOTE — Telephone Encounter (Signed)
 I called pt - no answer. Neuro referral ordered 08/22/23 per Arnell Lange per chart. Left message on pt's vm to call the office. I will ask the doctor about her question of PT.

## 2024-03-05 NOTE — Telephone Encounter (Signed)
 Copied from CRM [id number]. Topic: General - Other >> Mar 05, 2024  1:11 PM Adrianna P wrote: Reason for CRM: Patient wants to know if she still needs to do physical therapy, and she wants an update on neurologist appt

## 2024-03-08 ENCOUNTER — Other Ambulatory Visit: Payer: Self-pay | Admitting: Student

## 2024-03-08 DIAGNOSIS — E1122 Type 2 diabetes mellitus with diabetic chronic kidney disease: Secondary | ICD-10-CM

## 2024-03-11 NOTE — Telephone Encounter (Signed)
 Medication sent to pharmacy

## 2024-03-12 ENCOUNTER — Telehealth: Payer: Self-pay | Admitting: *Deleted

## 2024-03-12 NOTE — Telephone Encounter (Signed)
 Call to patient states that she does not use the Sensors for Continuous glucose readings and has no idea why we are receiving the calls.  Unable to reach Ayliah.  Copied from CRM [id number]. Topic: Clinical - Order For Equipment >> Mar 12, 2024 11:53 AM [name] wrote: Reason for CRM: Theola Fitch - calling on status   5/22 and 03/05/24 fax over continous Glucose monitor  will be refaxing the order today 03/12/2024 to the [phone] call back [phone]

## 2024-03-12 NOTE — Telephone Encounter (Signed)
 Lvm ([facility]- [phone] call back regarding total hours per month patient is receiving in [facility]. Office to return call to [phone] internal medicine office

## 2024-03-19 DIAGNOSIS — N1831 Chronic kidney disease, stage 3a: Secondary | ICD-10-CM | POA: Diagnosis not present

## 2024-03-19 DIAGNOSIS — G8929 Other chronic pain: Secondary | ICD-10-CM | POA: Diagnosis not present

## 2024-03-19 DIAGNOSIS — Z794 Long term (current) use of insulin: Secondary | ICD-10-CM | POA: Diagnosis not present

## 2024-03-19 DIAGNOSIS — Z9181 History of falling: Secondary | ICD-10-CM | POA: Diagnosis not present

## 2024-03-19 DIAGNOSIS — E119 Type 2 diabetes mellitus without complications: Secondary | ICD-10-CM | POA: Diagnosis not present

## 2024-03-19 DIAGNOSIS — E559 Vitamin D deficiency, unspecified: Secondary | ICD-10-CM | POA: Diagnosis not present

## 2024-03-19 DIAGNOSIS — Z79899 Other long term (current) drug therapy: Secondary | ICD-10-CM | POA: Diagnosis not present

## 2024-03-19 DIAGNOSIS — M545 Low back pain, unspecified: Secondary | ICD-10-CM | POA: Diagnosis not present

## 2024-03-19 DIAGNOSIS — Z0000 Encounter for general adult medical examination without abnormal findings: Secondary | ICD-10-CM | POA: Diagnosis not present

## 2024-03-20 ENCOUNTER — Telehealth: Payer: Self-pay

## 2024-03-20 ENCOUNTER — Telehealth: Payer: Self-pay | Admitting: *Deleted

## 2024-03-20 ENCOUNTER — Ambulatory Visit: Attending: Student | Admitting: Physical Therapy

## 2024-03-20 DIAGNOSIS — W101XXA Fall (on)(from) sidewalk curb, initial encounter: Secondary | ICD-10-CM | POA: Insufficient documentation

## 2024-03-20 DIAGNOSIS — M25552 Pain in left hip: Secondary | ICD-10-CM | POA: Insufficient documentation

## 2024-03-20 DIAGNOSIS — R296 Repeated falls: Secondary | ICD-10-CM | POA: Insufficient documentation

## 2024-03-20 DIAGNOSIS — M6281 Muscle weakness (generalized): Secondary | ICD-10-CM | POA: Insufficient documentation

## 2024-03-20 NOTE — Therapy (Signed)
 OUTPATIENT PHYSICAL THERAPY LOWER EXTREMITY EVALUATION   Patient Name: Kara Kelley MRN: [id number] DOB:November 13, 1955, 67 y.o., female Today's Date: 03/20/2024  END OF SESSION:  PT End of Session - 03/20/24 1102     Visit Number 1    Number of Visits 12             Past Medical History:  Diagnosis Date   Anemia    Anxiety    Arthritis    Asthma    Bacterial vaginosis 05/16/2022   Bunion    Callus    Candida vaginitis 09/16/2022   Cholelithiasis without obstruction 04/11/2022   Chronic pain    Cocaine abuse (HCC)    COPD (chronic obstructive pulmonary disease) (HCC)    Corns and callosities    Degenerative joint disease    Depression    Diabetes mellitus    Endometrial polyp    ETOH abuse    Gall stones    Gallbladder calculus with acute cholecystitis and no obstruction 04/11/2022   GERD (gastroesophageal reflux disease)    Headache    history of Migraines   Hepatitis C    Hep C   History of adenomatous polyp of colon 04/11/2022   History of endometrial ablation 02/06/2013   Hyperlipidemia    Hypertension    Insomnia    Spondylolisthesis of lumbar region    Substance abuse (HCC)    alcoholism   Tuberculosis 1985   Wears dentures    Wears glasses    Past Surgical History:  Procedure Laterality Date   ANTERIOR CERVICAL DECOMP/DISCECTOMY FUSION N/A 10/26/2021   Procedure: ACDF - C4-C5 - C5-C6 - C6-C7;  Surgeon: Agustina Aldrich, MD;  Location: [facility] OR;  Service: Neurosurgery;  Laterality: N/A;   BACK SURGERY  2018   CESAREAN SECTION     x3   CHOLECYSTECTOMY N/A 08/11/2016   Procedure: LAPAROSCOPIC CHOLECYSTECTOMY WITH   INTRAOPERATIVE CHOLANGIOGRAM;  Surgeon: Lockie Rima, MD;  Location: [facility];  Service: General;  Laterality: N/A;   COLONOSCOPY     Cotton Osteotomy w/ Graft Left 06/18/2009   Excision of Benign Lesion Right 01/30/2013   Rt Plantar   FOOT SURGERY     HAMMER TOE REPAIR Right 02/12/2016   RIGHT #5   Hammertoe Repair Left 06/18/2009   Lt #5    HYSTEROSCOPY N/A 06/20/2017   Procedure: DILATION AND CURETTAGE, HYSTEROSCOPY w/ Polypectomy;  Surgeon: Lenord Radon, MD;  Location: [facility];  Service: Gynecology;  Laterality: N/A;   LUMBAR FUSION  2018   METATARSAL OSTEOTOMY Left 06/18/2009   #5   MULTIPLE TOOTH EXTRACTIONS     Nail Matrixectomy Left 06/18/2009   LT #1   OSTEOTOMY Right 01/30/2013   Rt #5   Phalangectomy Left 06/18/2009   LT #1   Phalangectomy Right 01/30/2013   Rt #1   TUBAL LIGATION     Patient Active Problem List   Diagnosis Date Noted   Polypharmacy 02/27/2024   Hyperlipidemia 02/27/2024   Gastroesophageal reflux disease 02/27/2024   Falls, subsequent encounter 02/07/2024   Anemia 12/21/2023   Insomnia 11/22/2023   Osteoporosis 11/09/2023   Need for diphtheria-tetanus-pertussis (Tdap) vaccine 11/01/2023   Vaccine for streptococcus pneumoniae and influenza 11/01/2023   Neck pain 08/23/2023   Colon cancer screening 03/01/2023   Spinal stenosis, lumbar 08/24/2022   Morbid obesity (HCC) 04/11/2022   Polysubstance abuse (HCC) 04/11/2022   Type 2 diabetes mellitus with stage 3b chronic kidney disease, with long-term current use of insulin  (HCC) 04/11/2022  Hyperthyroidism 12/29/2021   Cervical spondylosis with myelopathy and radiculopathy 10/26/2021   Hypertensive retinopathy of both eyes 03/06/2019   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  Rationale for Evaluation and Treatment: Rehabilitation  ONSET DATE: 3 weeks    SUBJECTIVE:   SUBJECTIVE STATEMENT: Daughter Kara Kelley presents with her mother for PT eval.   Pt reports about 3 weeks ago she was not paying attention while talking to someone, turned and caught her foot up.  She fell on concrete, hit her knee. Not long after that, she slipped while coming into her building. She landed on her L hip.  Both times she was not using an assistive device.  The hip is hurting a lot, Rt knee less so. She does have chronic low back pain as well which she had PT for.  She cont to have pain pain with sitting, standing, walking.  [facility].  She feels weak in both legs. Numbness in LLE the other day but not consistent.    PERTINENT HISTORY: Lumbar surgery COPD Neck surgery    MD note: Patient presents today with bilateral hip pain after falling again.  She states she slipped on a rainy day and fell on her left hip.  She since has had aching and throbbing type pains.  She was seen in the clinic for this, and was told to get Voltaren  gel and lidocaine  patches, but never did.  She states she has tried Tylenol  with no improvement.  She states it is worse when she stands up and starts moving, but gets better as she starts moving.  On my exam she does have some tenderness noted to the left hip.  She does have 4/5 strength noted on bilateral hips at hip flexion.  5/5 strength noted to knee flexion and knee extension.  5/5 strength noted to ankle dorsiflexion and plantarflexion.  No obvious bruising or rashes noted.  No obvious deformities.  Likely this is a myalgia from her fall.  I do not think she has a fracture however given history of osteoporosis we will evaluate to rule out any fracture.     W10.1XXA (ICD-10-CM) - Fall (on)(from) sidewalk curb, initial encounter PAIN:  Are you having pain? Yes: NPRS scale: 8/10  Pain location: L hip Pain description: aching sore  Aggravating factors: standing  Relieving factors: medicine,  heating pad  PRECAUTIONS: Fall  RED FLAGS: None   WEIGHT BEARING RESTRICTIONS: No  FALLS:  Has patient fallen in last 6 months? Yes. Number of falls 2  LIVING ENVIRONMENT: Lives with: lives alone Lives in: House/apartment Stairs: No Has following equipment at home: Single point cane, Environmental consultant - 4 wheeled, and shower chair  OCCUPATION: not working  PLOF: WAS independent, now Independent with household mobility with device, Independent with community mobility with device, Needs assistance with ADLs, Needs assistance with homemaking, and Leisure: family time, grandkids    PATIENT GOALS: Pt would liek to get my balance together so I dont keep falling    NEXT MD VISIT: follow up  OBJECTIVE:  Note: Objective measures were completed at Evaluation unless otherwise noted.  DIAGNOSTIC FINDINGS:  XR FINDINGS: No evidence of pelvic fracture. Chronic irregularity about the right inferior pubic ramus. Pubic symphysis and sacroiliac joints are congruent. Both femoral heads are well-seated in the respective acetabula. Mild bilateral hip joint space narrowing. Lumbosacral fusion hardware, partially included.   IMPRESSION: 1. No acute fracture of the pelvis. 2. Mild bilateral hip osteoarthritis.  PATIENT SURVEYS:  LEFS TBA   COGNITION: Overall cognitive status: Within functional limits for tasks assessed     SENSATION: WFL  EDEMA:  NT   PALPATION: TTP throughout L lumbar and L posterior/lateral hip , unable to fully assess level of tension in the muscles   LOWER EXTREMITY JYN:WGNF ROM   Passive ROM Right eval Left eval  Hip flexion    Hip extension    Hip abduction    Hip adduction    Hip internal rotation    Hip external rotation    Knee flexion 115 120  Knee extension 0 0  Ankle dorsiflexion    Ankle plantarflexion    Ankle inversion    Ankle eversion     (Blank rows = not tested)  LOWER EXTREMITY MMT:  MMT Right eval Left eval  Hip flexion 3+ 4-  Hip  extension    Hip abduction    Hip adduction    Hip internal rotation Full  Full no pain   Hip external rotation Full  Full no pain   Knee flexion 4 4  Knee extension 4 4  Ankle dorsiflexion    Ankle plantarflexion    Ankle inversion    Ankle eversion     (Blank rows = not tested)  LOWER EXTREMITY SPECIAL TESTS:  NT   FUNCTIONAL TESTS:  5 times sit to stand: 55 sec  2 minute walk test: 155 feet  GAIT: Distance walked: 155 Assistive device utilized: Walker - 4 wheeled Level of assistance: Modified independence Comments: short step length, safe                                                                                                                                 TREATMENT DATE:  [facility] Adult PT Treatment:                                                DATE: 03/20/24  Therapeutic Activity: 2 min walk completed  POC reviewed, MMT  HEP review and demo  Hamstring Sit to stand  Piriformis    PATIENT EDUCATION:  Education details: see above  Person educated: Patient Education method: Explanation, Demonstration, and Handouts Education comprehension: verbalized understanding, returned demonstration, and needs further education  HOME EXERCISE PROGRAM: Access Code: [email or web address] URL: [email or web address] Date: 03/20/2024 Prepared by: Marci Setter  Exercises - Seated Hamstring Stretch  -  1 x daily - 7 x weekly - 1 sets - 3 reps - 30 hold - Seated Posterior Pelvic Tilt  - 1 x daily - 7 x weekly - 2 sets - 10 reps - 5 hold - Sit to Stand with Counter Support  - 3-5 x daily - 7 x weekly - 2 sets - 10 reps - 5 hold - Seated Figure 4 Piriformis Stretch  - 1 x daily - 7 x weekly - 1 sets - 3 reps - 30 hold  ASSESSMENT:  CLINICAL IMPRESSION: Patient is a 68 y.o. female who was seen today for physical therapy evaluation and treatment for muscular pain s/p fall. She has pain in her Rt knee but ROM was normal, strength symmetrical.  Pain is hip is more posterior  aspect and at the proximal thigh.  She will benefit from skilled PT to further assess balance and improve general mobility. She is now mindful to use her walker at all times.   OBJECTIVE IMPAIRMENTS: Abnormal gait, cardiopulmonary status limiting activity, decreased activity tolerance, decreased balance, decreased endurance, decreased knowledge of use of DME, decreased mobility, difficulty walking, decreased ROM, decreased strength, increased fascial restrictions, postural dysfunction, obesity, and pain.   ACTIVITY LIMITATIONS: carrying, lifting, bending, sitting, standing, squatting, sleeping, stairs, transfers, bed mobility, and locomotion level  PARTICIPATION LIMITATIONS: meal prep, cleaning, interpersonal relationship, shopping, and community activity  PERSONAL FACTORS: 1-2 comorbidities: chronic neck and back pain s/p surgery are also affecting patient's functional outcome.   REHAB POTENTIAL: Good  CLINICAL DECISION MAKING: Evolving/moderate complexity  EVALUATION COMPLEXITY: Moderate   GOALS: LONG TERM GOALS: Target date: 05/01/2024    Patient will be independent with final HEP upon discharge from PT and report consistent benefit following exercise completion.   Baseline: unknown Goal status: INITIAL  2.  Patient will improve 5 x STS to 30 sec or less, no UEs to demo decreased fall risk.  Baseline: 55 sec Goal status: INITIAL  3.  Pt will be able to demonstrate good standing balance as evidenced by balance recovery from min to mod dynamic balance challenges.  Baseline: TBA with balance assessment  Goal status: INITIAL  4.  Pt will be able to demonstrate 5/5  strength in hip/knee flexion/extension to improve transfers, gait pattern. Baseline: 3+/5 to 4/5 Goal status: INITIAL   PLAN:  PT FREQUENCY: 2x/week  PT DURATION: 6 weeks  PLANNED INTERVENTIONS: [id number]- PT Re-evaluation, 97750- Physical Performance Testing, 97110-Therapeutic exercises, 97530- Therapeutic activity,  [id number]- Neuromuscular re-education, 97535- Self Care, [id number]- Manual therapy, [phone]- Gait training, Patient/Family education, Balance training, Cryotherapy, and Moist heat  PLAN FOR NEXT SESSION: LEFS? Balance assessment , check HEP   Kristen Fromm, PT 03/20/2024, 11:07 AM

## 2024-03-20 NOTE — Telephone Encounter (Signed)
 Returned call to sylvia from  [facility] ( [phone]) regarding total number hour pcs hours patient is receiving. Ms Demuro is receiving total of 70 per month .

## 2024-03-20 NOTE — Telephone Encounter (Signed)
 Patient was identified as falling into the [place] Measure - Diabetes.   Patient was: Appointment scheduled with primary care provider in the next 30 days.

## 2024-03-21 DIAGNOSIS — Z79899 Other long term (current) drug therapy: Secondary | ICD-10-CM | POA: Diagnosis not present

## 2024-03-22 ENCOUNTER — Encounter: Payer: Self-pay | Admitting: Student

## 2024-03-22 ENCOUNTER — Other Ambulatory Visit: Payer: Self-pay

## 2024-03-22 ENCOUNTER — Ambulatory Visit: Payer: Self-pay | Admitting: *Deleted

## 2024-03-22 ENCOUNTER — Ambulatory Visit: Admitting: Student

## 2024-03-22 VITALS — BP 128/63 | HR 91 | Temp 98.4°F | Ht 60.0 in | Wt 207.4 lb

## 2024-03-22 DIAGNOSIS — B182 Chronic viral hepatitis C: Secondary | ICD-10-CM | POA: Diagnosis not present

## 2024-03-22 DIAGNOSIS — R051 Acute cough: Secondary | ICD-10-CM | POA: Insufficient documentation

## 2024-03-22 MED ORDER — BENZONATATE 100 MG PO CAPS: 100.0000 mg | ORAL_CAPSULE | Freq: Three times a day (TID) | ORAL | 0 refills | Status: DC | PRN

## 2024-03-22 MED ORDER — AZITHROMYCIN 500 MG PO TABS: 500.0000 mg | ORAL_TABLET | Freq: Every day | ORAL | 0 refills | Status: AC

## 2024-03-22 MED ORDER — PREDNISONE 20 MG PO TABS: 40.0000 mg | ORAL_TABLET | Freq: Every day | ORAL | 0 refills | Status: AC

## 2024-03-22 NOTE — Assessment & Plan Note (Addendum)
 Presents with productive cough for past 2-3 weeks. Endorses body aches, nasal congestion and wheezing. Increased dyspnea from baseline which she has been using her albuterol  more. Still taking Advair for COPD BID. Denies fever, chills, sore throat, n/v. Poor appetite but able to tolerate fluids well. Unable sick contacts but around her grandchildren prior to this. Tried OTC Nyquil, benadryl  without relief. Exam today is VSS, afebrile with clear breath sounds and minimal wheezing. Saturating well on RA.   Suspect possible viral URI with subsequent COPD exacerbation. She is stable appearing, exam stable and on RA. She is concern given recently seen her grandchildren, will do swab. Will treat for COPD exacerbation.   Plan -Nasal swab for COVID/Flu/RSV -Prednisone  40 mg daily x 5 days -Azithromycin  500 mg daily x 3 days -Tessalon perles TID PRN -F/u in 2-3 weeks and return precautions discussed with patient

## 2024-03-22 NOTE — Patient Instructions (Signed)
 Thank you, [name] for allowing us  to provide your care today. Today we discussed   -Suspect cough and congestion from respiratory virus and caused COPD flare up. -Start Prednisone  40 mg (2 tablets) once a day for 5 days -Start Azithromycin  500 mg once a day for 3 days -Start Tessalon perles up to 3 times a day as needed for cough -Pending nasal swab, will message with results    I have ordered the following medication/changed the following medications:  Start the following medications: Meds ordered this encounter  Medications   predniSONE  (DELTASONE ) 20 MG tablet    Sig: Take 2 tablets (40 mg total) by mouth daily with breakfast for 5 days.    Dispense:  10 tablet    Refill:  0   azithromycin  (ZITHROMAX ) 500 MG tablet    Sig: Take 1 tablet (500 mg total) by mouth daily for 3 days. Take 1 tablet daily for 3 days.    Dispense:  3 tablet    Refill:  0   benzonatate (TESSALON PERLES) 100 MG capsule    Sig: Take 1 capsule (100 mg total) by mouth 3 (three) times daily as needed for cough.    Dispense:  40 capsule    Refill:  0     Follow up: 2-3 weeks   Should you have any questions or concerns please call the internal medicine clinic at [phone].    Mitsy Owen, D.O. [facility]

## 2024-03-22 NOTE — Assessment & Plan Note (Signed)
 Hx of HCV. Did not fully address this today. Reports being treated several years ago and was seen by physician then but unable to recall it. Patient states will try to find records and/or names to follow up on at next visit.

## 2024-03-22 NOTE — Progress Notes (Signed)
 CC: acute visit: cough, congestion  HPI:  Kara Kelley is a 68 y.o. female living with a history stated below and presents today for acute visit. Please see problem based assessment and plan for additional details.  Past Medical History:  Diagnosis Date   Anemia    Anxiety    Arthritis    Asthma    Bacterial vaginosis 05/16/2022   Bunion    Callus    Candida vaginitis 09/16/2022   Cholelithiasis without obstruction 04/11/2022   Chronic pain    Cocaine abuse (HCC)    COPD (chronic obstructive pulmonary disease) (HCC)    Corns and callosities    Degenerative joint disease    Depression    Diabetes mellitus    Endometrial polyp    ETOH abuse    Gall stones    Gallbladder calculus with acute cholecystitis and no obstruction 04/11/2022   GERD (gastroesophageal reflux disease)    Headache    history of Migraines   Hepatitis C    Hep C   History of adenomatous polyp of colon 04/11/2022   History of endometrial ablation 02/06/2013   Hyperlipidemia    Hypertension    Insomnia    Spondylolisthesis of lumbar region    Substance abuse (HCC)    alcoholism   Tuberculosis 1985   Wears dentures    Wears glasses     Current Outpatient Medications on File Prior to Visit  Medication Sig Dispense Refill   Accu-Chek FastClix Lancets MISC Use as directed to check blood sugars 2 times per day dx: e11.65 102 each prn   Accu-Chek Softclix Lancets lancets Use as instructed to check blood sugars twice daily E11.69 100 each 2   acetaminophen  (TYLENOL ) 325 MG tablet Take 1-2 tablets (325-650 mg total) by mouth every 4 (four) hours as needed for mild pain.     albuterol  (VENTOLIN  HFA) 108 (90 Base) MCG/ACT inhaler Inhale 1-2 puffs into the lungs every 6 (six) hours as needed for wheezing or shortness of breath. 18 g 2   alendronate  (FOSAMAX ) 10 MG tablet Take 1 tablet (10 mg total) by mouth daily before breakfast. Take with a full glass of water on an empty stomach. 30 tablet 11    atorvastatin  (LIPITOR) 10 MG tablet Take 1 tablet (10 mg total) by mouth daily. 30 tablet 11   Blood Glucose Monitoring Suppl (ONETOUCH VERIO REFLECT) w/Device KIT Use to check blood sugar up to 3 times a day 1 kit 0   buPROPion  (WELLBUTRIN  XL) 300 MG 24 hr tablet Take 1 tablet (300 mg total) by mouth in the morning and at bedtime. 180 tablet 1   diclofenac  Sodium (VOLTAREN ) 1 % GEL Apply 4 g topically 4 (four) times daily. 100 g 2   DULoxetine  (CYMBALTA ) 60 MG capsule Take 1 capsule (60 mg total) by mouth daily. 90 capsule 3   empagliflozin  (JARDIANCE ) 10 MG TABS tablet Take 1 tablet (10 mg total) by mouth daily. 30 tablet 11   fluticasone  (FLONASE ) 50 MCG/ACT nasal spray Place 1 spray into both nostrils daily. 16 g 2   fluticasone -salmeterol (ADVAIR) 250-50 MCG/ACT AEPB INHALE 1 PUFF BY MOUTH INTO LUNGS TWICE DAILY IN THE MORNING AND AT BEDTIME 60 each 2   hydrOXYzine  (ATARAX ) 25 MG tablet Take 1 tablet (25 mg total) by mouth every 8 (eight) hours as needed for anxiety. 30 tablet 1   insulin  degludec (TRESIBA  FLEXTOUCH) 100 UNIT/ML FlexTouch Pen ADMINISTER 15 UNITS UNDER THE SKIN TWICE DAILY 27  mL 0   insulin  lispro (HUMALOG  KWIKPEN) 100 UNIT/ML KwikPen ADMINISTER 5 UNITS UNDER THE SKIN THREE TIMES DAILY WITH MEALS 3 mL 3   Insulin  Pen Needle (PEN NEEDLES) 31G X 8 MM MISC UAD 100 each 0   Insulin  Syringe-Needle U-100 (INSULIN  SYRINGE .3CC/30GX5/16) 30G X 5/16 0.3 ML MISC Use to inject Levemir  twice daily. 100 each 2   Iron , Ferrous Sulfate , 325 (65 Fe) MG TABS Take 325 mg by mouth daily. 30 tablet 2   Lancet Device MISC 1 each by Does not apply route in the morning, at noon, and at bedtime. May substitute to any manufacturer covered by patient's insurance. 100 each 0   lidocaine  (LIDODERM ) 5 % Place 1 patch onto the skin daily. Remove & Discard patch within 12 hours or as directed by MD 30 patch 0   LINZESS  145 MCG CAPS capsule Take 145 mcg by mouth daily before breakfast.       losartan -hydrochlorothiazide (HYZAAR) 100-25 MG tablet Take 1 tablet by mouth daily. 90 tablet 3   metFORMIN  (GLUCOPHAGE -XR) 500 MG 24 hr tablet Take 2 tablets (1,000 mg total) by mouth 2 (two) times daily with a meal. 120 tablet 11   Multiple Vitamins-Minerals (MULTIVITAMIN WITH MINERALS) tablet Take 1 tablet by mouth daily. Alive     omeprazole  (PRILOSEC) 40 MG capsule TAKE 1 CAPSULE(40 MG) BY MOUTH DAILY BEFORE BREAKFAST 90 capsule 3   ONETOUCH ULTRA test strip TEST AS DIRECTED UPTO 3 TIMES DAILY 100 strip 0   tirzepatide  (MOUNJARO ) 2.5 MG/0.5ML Pen Inject 2.5 mg into the skin once a week. 0.5 mL 3   traZODone  (DESYREL ) 50 MG tablet TAKE 3 TABLETS(150 MG) BY MOUTH AT BEDTIME AS NEEDED FOR SLEEP 90 tablet 0   No current facility-administered medications on file prior to visit.    Family History  Problem Relation Age of Onset   Heart disease Mother    Diabetes Other        mat great aunt   Cirrhosis Other        mat great aunt   Cirrhosis Other        mat great uncles x 2   Colon cancer Neg Hx    Breast cancer Neg Hx     Social History   Socioeconomic History   Marital status: Single    Spouse name: Not on file   Number of children: 3   Years of education: Not on file   Highest education level: Not on file  Occupational History   Occupation: disablily  Tobacco Use   Smoking status: Former    Current packs/day: 0.00    Average packs/day: 0.8 packs/day for 47.0 years (35.3 ttl pk-yrs)    Types: Cigarettes    Start date: 75    Quit date: 2020    Years since quitting: 5.4    Passive exposure: Past   Smokeless tobacco: Never  Vaping Use   Vaping status: Never Used  Substance and Sexual Activity   Alcohol use: No    Alcohol/week: 0.0 standard drinks of alcohol    Comment: in rehab   Drug use: No    Comment: in rehab   Sexual activity: Not Currently    Birth control/protection: Post-menopausal  Other Topics Concern   Not on file  Social History Narrative   Not on  file   Social Drivers of Health   Financial Resource Strain: Low Risk  (04/20/2023)   Overall Financial Resource Strain (CARDIA)    Difficulty of  Paying Living Expenses: Not hard at all  Food Insecurity: No Food Insecurity (04/20/2023)   Hunger Vital Sign    Worried About Running Out of Food in the Last Year: Never true    Ran Out of Food in the Last Year: Never true  Transportation Needs: No Transportation Needs (04/20/2023)   PRAPARE - Administrator, Civil Service (Medical): No    Lack of Transportation (Non-Medical): No  Physical Activity: Sufficiently Active (04/20/2023)   Exercise Vital Sign    Days of Exercise per Week: 3 days    Minutes of Exercise per Session: 60 min  Stress: No Stress Concern Present (04/20/2023)   [employer] of Occupational Health - Occupational Stress Questionnaire    Feeling of Stress : Only a little  Social Connections: Moderately Integrated (04/20/2023)   Social Connection and Isolation Panel    Frequency of Communication with Friends and Family: More than three times a week    Frequency of Social Gatherings with Friends and Family: More than three times a week    Attends Religious Services: More than 4 times per year    Active Member of [employer] or Organizations: Yes    Attends Banker Meetings: More than 4 times per year    Marital Status: Widowed  Intimate Partner Violence: Not At Risk (04/20/2023)   Humiliation, Afraid, Rape, and Kick questionnaire    Fear of Current or Ex-Partner: No    Emotionally Abused: No    Physically Abused: No    Sexually Abused: No    Review of Systems: ROS negative except for what is noted on the assessment and plan.  Vitals:   03/22/24 1051 03/22/24 1054  BP: (!) 140/67 128/63  Pulse: 99 91  Temp: 98.4 F (36.9 C)   TempSrc: Oral   SpO2: 100%   Weight: 207 lb 6.4 oz (94.1 kg)   Height: 5' (1.524 m)    Physical Exam: Constitutional: alert, sitting up in chair comfortably, in no  acute distress HENT: mucous membranes moist, congestion  Cardiovascular: regular rate and rhythm Pulmonary/Chest: normal work of breathing on room air, minimal wheezing, normal breath sounds Neurological: alert & oriented x 3  Assessment & Plan:   Acute cough Presents with productive cough for past 2-3 weeks. Endorses body aches, nasal congestion and wheezing. Increased dyspnea from baseline which she has been using her albuterol  more. Still taking Advair for COPD BID. Denies fever, chills, sore throat, n/v. Poor appetite but able to tolerate fluids well. Unable sick contacts but around her grandchildren prior to this. Tried OTC Nyquil, benadryl  without relief. Exam today is VSS, afebrile with clear breath sounds and minimal wheezing. Saturating well on RA.   Suspect possible viral URI with subsequent COPD exacerbation. She is stable appearing, exam stable and on RA. She is concern given recently seen her grandchildren, will do swab. Will treat for COPD exacerbation.   Plan -Nasal swab for COVID/Flu/RSV -Prednisone  40 mg daily x 5 days -Azithromycin  500 mg daily x 3 days -Tessalon perles TID PRN -F/u in 2-3 weeks and return precautions discussed with patient   Chronic hepatitis C (HCC) Hx of HCV. Did not fully address this today. Reports being treated several years ago and was seen by physician then but unable to recall it. Patient states will try to find records and/or names to follow up on at next visit.    Patient discussed with Dr. Nelson Bandy, D.O. [facility], PGY-2 Phone:  [phone] Date 03/22/2024 Time 3:02 PM

## 2024-03-22 NOTE — Telephone Encounter (Signed)
                     FYI Only or Action Required?: Action required by provider  Patient was last seen in primary care on 02/27/2024 by Jonelle Neri, DO. Called Nurse Triage reporting Cough. Symptoms began several weeks ago. Interventions attempted: OTC medications: Nyquil, coricidin, tylenol  . Symptoms are: gradually worsening.  Triage Disposition: See HCP Within 4 Hours (Or PCP Triage)  Patient/caregiver understands and will follow disposition?: Yes                 Copied from CRM [id number]. Topic: Clinical - Red Word Triage >> Mar 22, 2024  8:43 AM Julie Oddi wrote: Red Word that prompted transfer to Nurse Triage: Coughing, Shortness of Breath Reason for Disposition  [1] MILD difficulty breathing (e.g., minimal/no SOB at rest, SOB with walking, pulse <100) AND [2] still present when not coughing  Answer Assessment - Initial Assessment Questions 1. ONSET: When did the cough begin?      3 weeks  2. SEVERITY: How bad is the cough today?      Getting worse  3. SPUTUM: Describe the color of your sputum (none, dry cough; clear, white, yellow, green)     Green to brown 4. HEMOPTYSIS: Are you coughing up any blood? If so ask: How much? (flecks, streaks, tablespoons, etc.)     Na  5. DIFFICULTY BREATHING: Are you having difficulty breathing? If Yes, ask: How bad is it? (e.g., mild, moderate, severe)    - MILD: No SOB at rest, mild SOB with walking, speaks normally in sentences, can lie down, no retractions, pulse < 100.    - MODERATE: SOB at rest, SOB with minimal exertion and prefers to sit, cannot lie down flat, speaks in phrases, mild retractions, audible wheezing, pulse 100-120.    - SEVERE: Very SOB at rest, speaks in single words, struggling to breathe, sitting hunched forward, retractions, pulse > 120      SOB at rest report hx COPD 6. FEVER: Do you have a fever? If Yes, ask: What is your temperature, how was it measured, and when did it  start?     na 7. CARDIAC HISTORY: Do you have any history of heart disease? (e.g., heart attack, congestive heart failure)      See hx  8. LUNG HISTORY: Do you have any history of lung disease?  (e.g., pulmonary embolus, asthma, emphysema)     Hx COPD, asthma bronchitis 9. PE RISK FACTORS: Do you have a history of blood clots? (or: recent major surgery, recent prolonged travel, bedridden)     na 10. OTHER SYMPTOMS: Do you have any other symptoms? (e.g., runny nose, wheezing, chest pain)       Cough productive green brown in color SOB at rest.  11. PREGNANCY: Is there any chance you are pregnant? When was your last menstrual period?       na 12. TRAVEL: Have you traveled out of the country in the last month? (e.g., travel history, exposures)       Na Appt scheduled today by CAL .  Protocols used: Cough - Acute Productive-A-AH

## 2024-03-23 ENCOUNTER — Emergency Department (HOSPITAL_COMMUNITY)
Admission: EM | Admit: 2024-03-23 | Discharge: 2024-03-24 | Disposition: A | Discharge status: Home / Self Care | Attending: Emergency Medicine | Admitting: Emergency Medicine

## 2024-03-23 ENCOUNTER — Other Ambulatory Visit: Payer: Self-pay

## 2024-03-23 DIAGNOSIS — E871 Hypo-osmolality and hyponatremia: Secondary | ICD-10-CM | POA: Insufficient documentation

## 2024-03-23 DIAGNOSIS — E876 Hypokalemia: Secondary | ICD-10-CM | POA: Insufficient documentation

## 2024-03-23 DIAGNOSIS — R112 Nausea with vomiting, unspecified: Secondary | ICD-10-CM | POA: Diagnosis not present

## 2024-03-23 DIAGNOSIS — R911 Solitary pulmonary nodule: Secondary | ICD-10-CM | POA: Insufficient documentation

## 2024-03-23 DIAGNOSIS — K439 Ventral hernia without obstruction or gangrene: Secondary | ICD-10-CM | POA: Diagnosis not present

## 2024-03-23 DIAGNOSIS — R197 Diarrhea, unspecified: Secondary | ICD-10-CM | POA: Diagnosis not present

## 2024-03-23 DIAGNOSIS — R1031 Right lower quadrant pain: Secondary | ICD-10-CM | POA: Insufficient documentation

## 2024-03-23 LAB — COVID-19, FLU A+B AND RSV
Influenza A, NAA: NOT DETECTED
Influenza B, NAA: NOT DETECTED
RSV, NAA: NOT DETECTED
SARS-CoV-2, NAA: NOT DETECTED

## 2024-03-23 NOTE — ED Triage Notes (Signed)
 Abd pain for 3-4 days with some shortness of breath n v and diarrhea

## 2024-03-24 ENCOUNTER — Emergency Department (HOSPITAL_COMMUNITY)

## 2024-03-24 ENCOUNTER — Ambulatory Visit: Payer: Self-pay | Admitting: Student

## 2024-03-24 DIAGNOSIS — K439 Ventral hernia without obstruction or gangrene: Secondary | ICD-10-CM | POA: Diagnosis not present

## 2024-03-24 DIAGNOSIS — R1031 Right lower quadrant pain: Secondary | ICD-10-CM | POA: Diagnosis not present

## 2024-03-24 LAB — CBC
HCT: 32.6 % — ABNORMAL LOW (ref 36.0–46.0)
Hemoglobin: 10.9 g/dL — ABNORMAL LOW (ref 12.0–15.0)
MCH: 24.8 pg — ABNORMAL LOW (ref 26.0–34.0)
MCHC: 33.4 g/dL (ref 30.0–36.0)
MCV: 74.3 fL — ABNORMAL LOW (ref 80.0–100.0)
Platelets: 222 10*3/uL (ref 150–400)
RBC: 4.39 MIL/uL (ref 3.87–5.11)
RDW: 14.3 % (ref 11.5–15.5)
WBC: 9 10*3/uL (ref 4.0–10.5)
nRBC: 0 % (ref 0.0–0.2)

## 2024-03-24 LAB — COMPREHENSIVE METABOLIC PANEL WITH GFR
ALT: 15 U/L (ref 0–44)
AST: 21 U/L (ref 15–41)
Albumin: 3.6 g/dL (ref 3.5–5.0)
Alkaline Phosphatase: 81 U/L (ref 38–126)
Anion gap: 12 (ref 5–15)
BUN: 18 mg/dL (ref 8–23)
CO2: 20 mmol/L — ABNORMAL LOW (ref 22–32)
Calcium: 9.3 mg/dL (ref 8.9–10.3)
Chloride: 101 mmol/L (ref 98–111)
Creatinine, Ser: 1.37 mg/dL — ABNORMAL HIGH (ref 0.44–1.00)
GFR, Estimated: 43 mL/min — ABNORMAL LOW (ref >60)
Glucose, Bld: 221 mg/dL — ABNORMAL HIGH (ref 70–99)
Potassium: 3.2 mmol/L — ABNORMAL LOW (ref 3.5–5.1)
Sodium: 133 mmol/L — ABNORMAL LOW (ref 135–145)
Total Bilirubin: 0.4 mg/dL (ref 0.0–1.2)
Total Protein: 7.5 g/dL (ref 6.5–8.1)

## 2024-03-24 LAB — URINALYSIS, ROUTINE W REFLEX MICROSCOPIC
Bacteria, UA: NONE SEEN
Bilirubin Urine: NEGATIVE
Glucose, UA: >=500 mg/dL — AB
Hgb urine dipstick: NEGATIVE
Ketones, ur: NEGATIVE mg/dL
Nitrite: NEGATIVE
Protein, ur: NEGATIVE mg/dL
Specific Gravity, Urine: 1.006 (ref 1.005–1.030)
pH: 6 (ref 5.0–8.0)

## 2024-03-24 LAB — LIPASE, BLOOD: Lipase: 33 U/L (ref 11–51)

## 2024-03-24 MED ORDER — LACTATED RINGERS IV BOLUS
1000.0000 mL | Freq: Once | INTRAVENOUS | Status: AC
  Administered 2024-03-24: 1000 mL via INTRAVENOUS

## 2024-03-24 MED ORDER — DOCUSATE SODIUM 100 MG PO CAPS: 100.0000 mg | ORAL_CAPSULE | Freq: Two times a day (BID) | ORAL | 0 refills | Status: DC

## 2024-03-24 MED ORDER — IOHEXOL 350 MG/ML SOLN
75.0000 mL | Freq: Once | INTRAVENOUS | Status: AC | PRN
  Administered 2024-03-24: 75 mL via INTRAVENOUS

## 2024-03-24 MED ORDER — POLYETHYLENE GLYCOL 3350 17 GM/SCOOP PO POWD: 17.0000 g | Freq: Two times a day (BID) | ORAL | 0 refills | Status: AC

## 2024-03-24 MED ORDER — ONDANSETRON HCL 4 MG/2ML IJ SOLN
4.0000 mg | Freq: Once | INTRAMUSCULAR | Status: AC
  Administered 2024-03-24: 4 mg via INTRAVENOUS
  Filled 2024-03-24: qty 2

## 2024-03-24 MED ORDER — HYDROMORPHONE HCL 1 MG/ML IJ SOLN
0.5000 mg | Freq: Once | INTRAMUSCULAR | Status: AC
  Administered 2024-03-24: 0.5 mg via INTRAVENOUS
  Filled 2024-03-24: qty 1

## 2024-03-24 NOTE — ED Notes (Signed)
 Patient returned from CT

## 2024-03-24 NOTE — ED Notes (Signed)
 Patient transported to CT

## 2024-03-24 NOTE — ED Provider Notes (Signed)
 [facility] Emergency Department Provider Note MRN:  [id number]  Arrival date & time: 03/24/24     Chief Complaint   Abdominal Pain   History of Present Illness   Kara Kelley is a 68 y.o. year-old female presents to the ED with chief complaint of right sided abdominal pain.  States that it started a couple of days ago.  States that it feels like she is being stabbed with a knife.  Reports having associated cough, nausea, vomiting, and diarrhea.  Denies fever or chills. States that the pain is mainly in her lower abdomen and moves to her naval.    History provided by patient.   Review of Systems  Pertinent positive and negative review of systems noted in HPI.    Physical Exam   Vitals:   03/23/24 2348 03/24/24 0245  BP: (!) 155/89 (!) 157/76  Pulse: 86 81  Resp: (!) 24 (!) 22  Temp: 98.7 F (37.1 C)   SpO2: 96% 99%    CONSTITUTIONAL:  uncomfortable-appearing, NAD NEURO:  Alert and oriented x 3, CN 3-12 grossly intact EYES:  eyes equal and reactive ENT/NECK:  Supple, no stridor  CARDIO:  normal rate, regular rhythm, appears well-perfused  PULM:  No respiratory distress, CTAB GI/GU:  non-distended, RLQ TTP MSK/SPINE:  No gross deformities, no edema, moves all extremities  SKIN:  no rash, atraumatic   *Additional and/or pertinent findings included in MDM below  Diagnostic and Interventional Summary    EKG Interpretation Date/Time:    Ventricular Rate:    PR Interval:    QRS Duration:    QT Interval:    QTC Calculation:   R Axis:      Text Interpretation:         Labs Reviewed  COMPREHENSIVE METABOLIC PANEL WITH GFR - Abnormal; Notable for the following components:      Result Value   Sodium 133 (*)    Potassium 3.2 (*)    CO2 20 (*)    Glucose, Bld 221 (*)    Creatinine, Ser 1.37 (*)    GFR, Estimated 43 (*)    All other components within normal limits  CBC - Abnormal; Notable for the following components:   Hemoglobin  10.9 (*)    HCT 32.6 (*)    MCV 74.3 (*)    MCH 24.8 (*)    All other components within normal limits  URINALYSIS, ROUTINE W REFLEX MICROSCOPIC - Abnormal; Notable for the following components:   Color, Urine STRAW (*)    Glucose, UA >=500 (*)    Leukocytes,Ua TRACE (*)    All other components within normal limits  LIPASE, BLOOD    CT ABDOMEN PELVIS W CONTRAST  Final Result      Medications  lactated ringers  bolus 1,000 mL (0 mLs Intravenous Stopped 03/24/24 0419)  ondansetron  (ZOFRAN ) injection 4 mg (4 mg Intravenous Given 03/24/24 0315)  HYDROmorphone  (DILAUDID ) injection 0.5 mg (0.5 mg Intravenous Given 03/24/24 0314)  iohexol  (OMNIPAQUE ) 350 MG/ML injection 75 mL (75 mLs Intravenous Contrast Given 03/24/24 0540)     Procedures  /  Critical Care Procedures  ED Course and Medical Decision Making  I have reviewed the triage vital signs, the nursing notes, and pertinent available records from the EMR.  Social Determinants Affecting Complexity of Care: Patient has no clinically significant social determinants affecting this chief complaint..   ED Course:    Medical Decision Making Patient here with nausea, vomiting, diarrhea.  Has some right-sided  abdominal pain.  Will check CT to rule out appendicitis.  Labs are fairly reassuring.  Mildly elevated creatinine, decreased sodium and potassium and chloride, likely slightly dehydrated.  Will give fluids.  Will treat pain with IV pain medicine.  CT shows no emergent pathology.  Will have her take stool softener.  She did have incidental nodule, which I have discussed with her, and she will follow-up with PCP.  Return precautions discussed.  She is stable and ready for discharge.    Amount and/or Complexity of Data Reviewed Radiology: ordered.  Risk OTC drugs. Prescription drug management.         Consultants: No consultations were needed in caring for this patient.   Treatment and Plan: I considered admission due to  patient's initial presentation, but after considering the examination and diagnostic results, patient will not require admission and can be discharged with outpatient follow-up.    Final Clinical Impressions(s) / ED Diagnoses     ICD-10-CM   1. Right lower quadrant abdominal pain  R10.31     2. Nausea vomiting and diarrhea  R11.2    R19.7     3. Lung nodule  R91.1       ED Discharge Orders          Ordered    docusate sodium  (COLACE) 100 MG capsule  Every 12 hours        03/24/24 0620    polyethylene glycol powder (GLYCOLAX /MIRALAX ) 17 GM/SCOOP powder  2 times daily        03/24/24 0620              Discharge Instructions Discussed with and Provided to Patient:     Discharge Instructions      Your CT didn't show any emergent condition. Please take the stool softener as directed.  Return for new or worsening symptoms.  Please follow-up with your doctor.  Please discuss the following incidental findings seen on CT.  2. 10 mm nodule in the posterior left costophrenic sulcus. This may  be atelectatic but warrants close follow-up. Consider one of the  following in 3 months for both low-risk and high-risk individuals:  (a) repeat chest CT, (b) follow-up PET-CT, or (c) tissue sampling.  This recommendation follows the consensus statement: Guidelines for  Management of Incidental Pulmonary Nodules Detected on CT Images:  From the Fleischner Society 2017; Radiology 2017; 284:228-243.        Sherel Dikes, PA-C 03/24/24 1610    Rory Collard, MD 03/24/24 [phone]

## 2024-03-24 NOTE — Discharge Instructions (Signed)
 Your CT didn't show any emergent condition. Please take the stool softener as directed.  Return for new or worsening symptoms.  Please follow-up with your doctor.  Please discuss the following incidental findings seen on CT.  2. 10 mm nodule in the posterior left costophrenic sulcus. This may  be atelectatic but warrants close follow-up. Consider one of the  following in 3 months for both low-risk and high-risk individuals:  (a) repeat chest CT, (b) follow-up PET-CT, or (c) tissue sampling.  This recommendation follows the consensus statement: Guidelines for  Management of Incidental Pulmonary Nodules Detected on CT Images:  From the Fleischner Society 2017; Radiology 2017; 284:228-243.

## 2024-03-25 ENCOUNTER — Ambulatory Visit: Payer: Self-pay

## 2024-03-25 NOTE — Telephone Encounter (Signed)
 FYI Only or Action Required?: Action required by provider  Patient was last seen in primary care on 03/22/2024 by Jearldine Mina, DO. Called Nurse Triage reporting Nausea. Symptoms began several days ago. Interventions attempted: Nothing. Symptoms are: gradually worsening.  Triage Disposition: See PCP When Office is Open (Within 3 Days)  Patient/caregiver understands and will follow disposition?: NoCopied from CRM [id number]. Topic: Clinical - Red Word Triage >> Mar 25, 2024  2:07 PM [name] wrote: Red Word that prompted transfer to Nurse Triage: patient went to the emergency on Saturday 03/23/24 due to bowel blockage ,  Patient have stomach pain rate 7 and when cough its worse, also vomiting , patient is nausea (patient is requesting medication for nausea  Call back [phone] Reason for Disposition  Nausea lasts > 1 week  Answer Assessment - Initial Assessment Questions 1. NAUSEA SEVERITY: How bad is the nausea? (e.g., mild, moderate, severe; dehydration, weight loss)   - MILD: loss of appetite without change in eating habits   - MODERATE: decreased oral intake without significant weight loss, dehydration, or malnutrition   - SEVERE: inadequate caloric or fluid intake, significant weight loss, symptoms of dehydration     moderate 2. ONSET: When did the nausea begin?     Not sure 3. VOMITING: Any vomiting? If Yes, ask: How many times today?     Once today   5. CAUSE: What do you think is causing the nausea?     constipation   Pt went to ED on 6/14 with bowel blockage. Pt is battling nausea. Pt has had bowel movement. Pt stated she has vomited once today. Pt has refused appt. Pt wants nausea medication and  a muscle relaxer called in. Please advise  Protocols used: Nausea-A-AH

## 2024-03-26 ENCOUNTER — Telehealth: Payer: Self-pay | Admitting: *Deleted

## 2024-03-26 ENCOUNTER — Encounter: Admitting: Student

## 2024-03-26 NOTE — Telephone Encounter (Signed)
 Copied from CRM [id number]. Topic: Clinical - Prescription Issue >> Mar 26, 2024  1:52 PM Kara Kelley H wrote: Reason for CRM: Patient called in because she is trying to see what the status is on her muscle spasm medicine and nausea, patient can be called back at [phone].

## 2024-03-26 NOTE — Telephone Encounter (Signed)
 Noted that appointment is made for 04/04/24. Will see her then. Thank you.

## 2024-03-27 NOTE — Therapy (Incomplete)
 OUTPATIENT PHYSICAL THERAPY LOWER EXTREMITY EVALUATION   Patient Name: Kara Kelley MRN: [id number] DOB:May 27, 1956, 68 y.o., female Today's Date: 03/27/2024  END OF SESSION:    Past Medical History:  Diagnosis Date   Anemia    Anxiety    Arthritis    Asthma    Bacterial vaginosis 05/16/2022   Bunion    Callus    Candida vaginitis 09/16/2022   Cholelithiasis without obstruction 04/11/2022   Chronic pain    Cocaine abuse (HCC)    COPD (chronic obstructive pulmonary disease) (HCC)    Corns and callosities    Degenerative joint disease    Depression    Diabetes mellitus    Endometrial polyp    ETOH abuse    Gall stones    Gallbladder calculus with acute cholecystitis and no obstruction 04/11/2022   GERD (gastroesophageal reflux disease)    Headache    history of Migraines   Hepatitis C    Hep C   History of adenomatous polyp of colon 04/11/2022   History of endometrial ablation 02/06/2013   Hyperlipidemia    Hypertension    Insomnia    Spondylolisthesis of lumbar region    Substance abuse (HCC)    alcoholism   Tuberculosis 1985   Wears dentures    Wears glasses    Past Surgical History:  Procedure Laterality Date   ANTERIOR CERVICAL DECOMP/DISCECTOMY FUSION N/A 10/26/2021   Procedure: ACDF - C4-C5 - C5-C6 - C6-C7;  Surgeon: Agustina Aldrich, MD;  Location: [facility];  Service: Neurosurgery;  Laterality: N/A;   BACK SURGERY  2018   CESAREAN SECTION     x3   CHOLECYSTECTOMY N/A 08/11/2016   Procedure: LAPAROSCOPIC CHOLECYSTECTOMY WITH   INTRAOPERATIVE CHOLANGIOGRAM;  Surgeon: Lockie Rima, MD;  Location: [facility];  Service: General;  Laterality: N/A;   COLONOSCOPY     Cotton Osteotomy w/ Graft Left 06/18/2009   Excision of Benign Lesion Right 01/30/2013   Rt Plantar   FOOT SURGERY     HAMMER TOE REPAIR Right 02/12/2016   RIGHT #5   Hammertoe Repair Left 06/18/2009   Lt #5   HYSTEROSCOPY N/A 06/20/2017   Procedure: DILATION AND CURETTAGE, HYSTEROSCOPY w/ Polypectomy;   Surgeon: Lenord Radon, MD;  Location: [facility];  Service: Gynecology;  Laterality: N/A;   LUMBAR FUSION  2018   METATARSAL OSTEOTOMY Left 06/18/2009   #5   MULTIPLE TOOTH EXTRACTIONS     Nail Matrixectomy Left 06/18/2009   LT #1   OSTEOTOMY Right 01/30/2013   Rt #5   Phalangectomy Left 06/18/2009   LT #1   Phalangectomy Right 01/30/2013   Rt #1   TUBAL LIGATION     Patient Active Problem List   Diagnosis Date Noted   Acute cough 03/22/2024   Polypharmacy 02/27/2024   Hyperlipidemia 02/27/2024   Falls, subsequent encounter 02/07/2024   Anemia 12/21/2023   Insomnia 11/22/2023   Osteoporosis 11/09/2023   Neck pain 08/23/2023   Spinal stenosis, lumbar 08/24/2022   Morbid obesity (HCC) 04/11/2022   Polysubstance abuse (HCC) 04/11/2022   Type 2 diabetes mellitus with stage 3b chronic kidney disease, with long-term current use of insulin  (HCC) 04/11/2022   Hyperthyroidism 12/29/2021   Cervical spondylosis with myelopathy and radiculopathy 10/26/2021   Hypertensive retinopathy of both eyes 03/06/2019   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  Rationale for Evaluation and Treatment: Rehabilitation  ONSET DATE: 3 weeks   SUBJECTIVE:   SUBJECTIVE STATEMENT:   EVAL: Daughter Katharine Paling presents with her mother for PT eval.   Pt reports about 3 weeks ago she was not paying attention while talking to someone, turned and caught her foot up.  She fell on concrete,  hit her knee. Not long after that, she slipped while coming into her building. She landed on her L hip.  Both times she was not using an assistive device.  The hip is hurting a lot, Rt knee less so. She does have chronic low back pain as well which she had PT for.  She cont to have pain pain with sitting, standing, walking.  Goes to the pain clinic [facility]) for medicine.  She feels weak in both legs. Numbness in LLE the other day but not consistent.    PERTINENT HISTORY: Lumbar surgery COPD Neck surgery    MD note: Patient presents today with bilateral hip pain after falling again.  She states she slipped on a rainy day and fell on her left hip.  She since has had aching and throbbing type pains.  She was seen in the clinic for this, and was told to get Voltaren  gel and lidocaine  patches, but never did.  She states she has tried Tylenol  with no improvement.  She states it is worse when she stands up and starts moving, but gets better as she starts moving.  On my exam she does have some tenderness noted to the left hip.  She does have 4/5 strength noted on bilateral hips at hip flexion.  5/5 strength noted to knee flexion and knee extension.  5/5 strength noted to ankle dorsiflexion and plantarflexion.  No obvious bruising or rashes noted.  No obvious deformities.  Likely this is a myalgia from her fall.  I do not think she has a fracture however given history of osteoporosis we will evaluate to rule out any fracture.     W10.1XXA (ICD-10-CM) - Fall (on)(from) sidewalk curb, initial encounter PAIN:  Are you having pain? Yes: NPRS scale: 8/10  Pain location: L hip Pain description: aching sore  Aggravating factors: standing  Relieving factors: medicine, heating pad  PRECAUTIONS: Fall  RED FLAGS: None   WEIGHT BEARING RESTRICTIONS: No  FALLS:  Has patient fallen in last 6 months? Yes. Number of falls 2  LIVING ENVIRONMENT: Lives with: lives alone Lives in:  House/apartment Stairs: No Has following equipment at home: Single point cane, Environmental consultant - 4 wheeled, and shower chair  OCCUPATION: not working  PLOF: WAS independent, now Independent with household mobility with device, Independent with community mobility with device, Needs assistance with ADLs, Needs assistance with homemaking, and Leisure: family time, grandkids    PATIENT GOALS: Pt would liek to get my balance together so I dont keep falling    NEXT MD VISIT: follow up   OBJECTIVE:  Note: Objective measures were completed at Evaluation unless otherwise noted.  DIAGNOSTIC FINDINGS:  XR FINDINGS: No evidence of pelvic fracture. Chronic irregularity about the right inferior pubic ramus. Pubic symphysis and sacroiliac joints are congruent. Both femoral heads are well-seated in the  respective acetabula. Mild bilateral hip joint space narrowing. Lumbosacral fusion hardware, partially included.   IMPRESSION: 1. No acute fracture of the pelvis. 2. Mild bilateral hip osteoarthritis.  PATIENT SURVEYS:  LEFS TBA   COGNITION: Overall cognitive status: Within functional limits for tasks assessed     SENSATION: WFL  EDEMA:  NT   PALPATION: TTP throughout L lumbar and L posterior/lateral hip , unable to fully assess level of tension in the muscles   LOWER EXTREMITY ZOX:WRUE ROM   Passive ROM Right eval Left eval  Hip flexion    Hip extension    Hip abduction    Hip adduction    Hip internal rotation    Hip external rotation    Knee flexion 115 120  Knee extension 0 0  Ankle dorsiflexion    Ankle plantarflexion    Ankle inversion    Ankle eversion     (Blank rows = not tested)  LOWER EXTREMITY MMT:  MMT Right eval Left eval  Hip flexion 3+ 4-  Hip extension    Hip abduction    Hip adduction    Hip internal rotation Full  Full no pain   Hip external rotation Full  Full no pain   Knee flexion 4 4  Knee extension 4 4  Ankle dorsiflexion    Ankle  plantarflexion    Ankle inversion    Ankle eversion     (Blank rows = not tested)  LOWER EXTREMITY SPECIAL TESTS:  NT   FUNCTIONAL TESTS:  5 times sit to stand: 55 sec  2 minute walk test: 155 feet  GAIT: Distance walked: 155 Assistive device utilized: Walker - 4 wheeled Level of assistance: Modified independence Comments: short step length, safe                                                                                                                                 TREATMENT DATE:  [facility] Adult PT Treatment:                                                DATE: 03/28/24 Therapeutic Exercise: *** Manual Therapy: *** Neuromuscular re-ed: *** Therapeutic Activity: *** Modalities: *** Self Care: Renal[facility] Caroli Adult PT Treatment:                                                DATE: 03/20/24 Therapeutic Activity: 2 min walk completed  POC reviewed, MMT  HEP review and demo  Hamstring Sit to stand  Piriformis    PATIENT EDUCATION:  Education details: see above  Person educated: Patient Education method: Explanation, Demonstration, and Handouts Education comprehension: verbalized understanding, returned demonstration,  and needs further education  HOME EXERCISE PROGRAM: Access Code: [email or web address] URL: [email or web address] Date: 03/20/2024 Prepared by: Marci Setter  Exercises - Seated Hamstring Stretch  - 1 x daily - 7 x weekly - 1 sets - 3 reps - 30 hold - Seated Posterior Pelvic Tilt  - 1 x daily - 7 x weekly - 2 sets - 10 reps - 5 hold - Sit to Stand with Counter Support  - 3-5 x daily - 7 x weekly - 2 sets - 10 reps - 5 hold - Seated Figure 4 Piriformis Stretch  - 1 x daily - 7 x weekly - 1 sets - 3 reps - 30 hold  ASSESSMENT:  CLINICAL IMPRESSION:   EVAL: Patient is a 68 y.o. female who was seen today for physical therapy evaluation and treatment for muscular pain s/p fall. She has pain in her Rt knee but ROM was normal, strength symmetrical.   Pain is hip is more posterior aspect and at the proximal thigh.  She will benefit from skilled PT to further assess balance and improve general mobility. She is now mindful to use her walker at all times.   OBJECTIVE IMPAIRMENTS: Abnormal gait, cardiopulmonary status limiting activity, decreased activity tolerance, decreased balance, decreased endurance, decreased knowledge of use of DME, decreased mobility, difficulty walking, decreased ROM, decreased strength, increased fascial restrictions, postural dysfunction, obesity, and pain.   ACTIVITY LIMITATIONS: carrying, lifting, bending, sitting, standing, squatting, sleeping, stairs, transfers, bed mobility, and locomotion level  PARTICIPATION LIMITATIONS: meal prep, cleaning, interpersonal relationship, shopping, and community activity  PERSONAL FACTORS: 1-2 comorbidities: chronic neck and back pain s/p surgery are also affecting patient's functional outcome.   REHAB POTENTIAL: Good  CLINICAL DECISION MAKING: Evolving/moderate complexity  EVALUATION COMPLEXITY: Moderate   GOALS: LONG TERM GOALS: Target date: 05/01/2024    Patient will be independent with final HEP upon discharge from PT and report consistent benefit following exercise completion.   Baseline: unknown Goal status: INITIAL  2.  Patient will improve 5 x STS to 30 sec or less, no UEs to demo decreased fall risk.  Baseline: 55 sec Goal status: INITIAL  3.  Pt will be able to demonstrate good standing balance as evidenced by balance recovery from min to mod dynamic balance challenges.  Baseline: TBA with balance assessment  Goal status: INITIAL  4.  Pt will be able to demonstrate 5/5  strength in hip/knee flexion/extension to improve transfers, gait pattern. Baseline: 3+/5 to 4/5 Goal status: INITIAL   PLAN:  PT FREQUENCY: 2x/week  PT DURATION: 6 weeks  PLANNED INTERVENTIONS: [id number]- PT Re-evaluation, 97750- Physical Performance Testing, 97110-Therapeutic exercises,  97530- Therapeutic activity, [id number]- Neuromuscular re-education, 97535- Self Care, [id number]- Manual therapy, [phone]- Gait training, Patient/Family education, Balance training, Cryotherapy, and Moist heat  PLAN FOR NEXT SESSION: LEFS? Balance assessment , check HEP   Tashema Tiller MS, PT 03/27/24 9:13 AM

## 2024-03-28 ENCOUNTER — Encounter

## 2024-03-29 NOTE — Progress Notes (Signed)
 Internal Medicine Clinic Attending  Case discussed with the resident at the time of the visit.  We reviewed the resident's history and exam and pertinent patient test results.  I agree with the assessment, diagnosis, and plan of care documented in the resident's note.

## 2024-03-31 ENCOUNTER — Other Ambulatory Visit: Payer: Self-pay | Admitting: Student

## 2024-03-31 DIAGNOSIS — E1122 Type 2 diabetes mellitus with diabetic chronic kidney disease: Secondary | ICD-10-CM

## 2024-04-03 ENCOUNTER — Encounter: Payer: Self-pay | Admitting: Physical Therapy

## 2024-04-03 ENCOUNTER — Telehealth: Payer: Self-pay | Admitting: *Deleted

## 2024-04-03 ENCOUNTER — Ambulatory Visit: Admitting: Physical Therapy

## 2024-04-03 DIAGNOSIS — R296 Repeated falls: Secondary | ICD-10-CM | POA: Diagnosis not present

## 2024-04-03 DIAGNOSIS — M25552 Pain in left hip: Secondary | ICD-10-CM | POA: Diagnosis not present

## 2024-04-03 DIAGNOSIS — M6281 Muscle weakness (generalized): Secondary | ICD-10-CM | POA: Diagnosis not present

## 2024-04-03 NOTE — Telephone Encounter (Signed)
 Copied from CRM [id number]. Topic: Clinical - Order For Equipment >> Apr 03, 2024  8:47 AM [name] wrote: Reason for CRM: [facility] called in requesting expedited request for glucose monitor order that was faxed over several times. Requesting order to be completed today an faxed back. Any additional questions please contact  [phone].

## 2024-04-03 NOTE — Therapy (Signed)
 OUTPATIENT PHYSICAL THERAPY LOWER EXTREMITY TREATMENT   Patient Name: Kara Kelley MRN: [id number] DOB:Sep 22, 1956, 68 y.o., female Today's Date: 04/03/2024  END OF SESSION:  PT End of Session - 04/03/24 1058     Visit Number 2    Number of Visits 12    Date for PT Re-Evaluation 05/01/24    Authorization Type U[employer] MCR Dual complete    PT Start Time 1100    PT Stop Time 1140    PT Time Calculation (min) 40 min          Past Medical History:  Diagnosis Date   Anemia    Anxiety    Arthritis    Asthma    Bacterial vaginosis 05/16/2022   Bunion    Callus    Candida vaginitis 09/16/2022   Cholelithiasis without obstruction 04/11/2022   Chronic pain    Cocaine abuse (HCC)    COPD (chronic obstructive pulmonary disease) (HCC)    Corns and callosities    Degenerative joint disease    Depression    Diabetes mellitus    Endometrial polyp    ETOH abuse    Gall stones    Gallbladder calculus with acute cholecystitis and no obstruction 04/11/2022   GERD (gastroesophageal reflux disease)    Headache    history of Migraines   Hepatitis C    Hep C   History of adenomatous polyp of colon 04/11/2022   History of endometrial ablation 02/06/2013   Hyperlipidemia    Hypertension    Insomnia    Spondylolisthesis of lumbar region    Substance abuse (HCC)    alcoholism   Tuberculosis 1985   Wears dentures    Wears glasses    Past Surgical History:  Procedure Laterality Date   ANTERIOR CERVICAL DECOMP/DISCECTOMY FUSION N/A 10/26/2021   Procedure: ACDF - C4-C5 - C5-C6 - C6-C7;  Surgeon: Louis Shove, MD;  Location: [facility];  Service: Neurosurgery;  Laterality: N/A;   BACK SURGERY  2018   CESAREAN SECTION     x3   CHOLECYSTECTOMY N/A 08/11/2016   Procedure: LAPAROSCOPIC CHOLECYSTECTOMY WITH   INTRAOPERATIVE CHOLANGIOGRAM;  Surgeon: Jina Nephew, MD;  Location: [facility];  Service: General;  Laterality: N/A;   COLONOSCOPY     Cotton Osteotomy w/ Graft Left 06/18/2009   Excision of  Benign Lesion Right 01/30/2013   Rt Plantar   FOOT SURGERY     HAMMER TOE REPAIR Right 02/12/2016   RIGHT #5   Hammertoe Repair Left 06/18/2009   Lt #5   HYSTEROSCOPY N/A 06/20/2017   Procedure: DILATION AND CURETTAGE, HYSTEROSCOPY w/ Polypectomy;  Surgeon: Corene Coy, MD;  Location: [facility];  Service: Gynecology;  Laterality: N/A;   LUMBAR FUSION  2018   METATARSAL OSTEOTOMY Left 06/18/2009   #5   MULTIPLE TOOTH EXTRACTIONS     Nail Matrixectomy Left 06/18/2009   LT #1   OSTEOTOMY Right 01/30/2013   Rt #5   Phalangectomy Left 06/18/2009   LT #1   Phalangectomy Right 01/30/2013   Rt #1   TUBAL LIGATION     Patient Active Problem List   Diagnosis Date Noted   Acute cough 03/22/2024   Polypharmacy 02/27/2024   Hyperlipidemia 02/27/2024   Falls, subsequent encounter 02/07/2024   Anemia 12/21/2023   Insomnia 11/22/2023   Osteoporosis 11/09/2023   Neck pain 08/23/2023   Spinal stenosis, lumbar 08/24/2022   Morbid obesity (HCC) 04/11/2022   Polysubstance abuse (HCC) 04/11/2022   Type 2 diabetes mellitus with stage  3b chronic kidney disease, with long-term current use of insulin  (HCC) 04/11/2022   Hyperthyroidism 12/29/2021   Cervical spondylosis with myelopathy and radiculopathy 10/26/2021   Hypertensive retinopathy of both eyes 03/06/2019   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) 03/06/2019   Essential hypertension 10/05/2018   Chronic obstructive pulmonary disease (HCC) 10/05/2018   Cramps, muscle, general 07/20/2018   Uncontrolled type 2 diabetes mellitus with hyperglycemia (HCC)    Chronic hepatitis C (HCC) 12/09/2015   Gastroesophageal reflux disease 05/22/2014    PCP: Jeanelle Layman CROME, MD  REFERRING PROVIDER: Jeanelle Layman CROME, MD  REFERRING DIAG: W10.1XXA (ICD-10-CM) - Fall (on)(from) sidewalk curb, initial encounter  THERAPY DIAG:  Pain in left hip  Muscle weakness (generalized)  Rationale for Evaluation and Treatment:  Rehabilitation  ONSET DATE: 3 weeks   SUBJECTIVE:   SUBJECTIVE STATEMENT: The left hip is still painful. 8/10 in left buttock. Can be 10/10. Took pain meds before coming today.    EVAL: Daughter [name] presents with her mother for PT eval.   Pt reports about 3 weeks ago she was not paying attention while talking to someone, turned and caught her foot up.  She fell on concrete, hit her knee. Not long after that, she slipped while coming into her building. She landed on her L hip.  Both times she was not using an assistive device.  The hip is hurting a lot, Rt knee less so. She does have chronic low back pain as well which she had PT for.  She cont to have pain pain with sitting, standing, walking.  Goes to the pain clinic [facility] for medicine.  She feels weak in both legs. Numbness in LLE the other day but not consistent.    PERTINENT HISTORY: Lumbar surgery COPD Neck surgery    MD note: Patient presents today with bilateral hip pain after falling again.  She states she slipped on a rainy day and fell on her left hip.  She since has had aching and throbbing type pains.  She was seen in the clinic for this, and was told to get Voltaren  gel and lidocaine  patches, but never did.  She states she has tried Tylenol  with no improvement.  She states it is worse when she stands up and starts moving, but gets better as she starts moving.  On my exam she does have some tenderness noted to the left hip.  She does have 4/5 strength noted on bilateral hips at hip flexion.  5/5 strength noted to knee flexion and knee extension.  5/5 strength noted to ankle dorsiflexion and plantarflexion.  No obvious bruising or rashes noted.  No obvious deformities.  Likely this is a myalgia from her fall.  I do not think she has a fracture however given history of osteoporosis we will evaluate to rule out any fracture.     W10.1XXA (ICD-10-CM) - Fall (on)(from) sidewalk curb, initial encounter PAIN:  Are  you having pain? Yes: NPRS scale: 8/10  Pain location: L hip Pain description: aching sore  Aggravating factors: standing  Relieving factors: medicine, heating pad  PRECAUTIONS: Fall  RED FLAGS: None   WEIGHT BEARING RESTRICTIONS: No  FALLS:  Has patient fallen in last 6 months? Yes. Number of falls 2  LIVING ENVIRONMENT: Lives with: lives alone Lives in: House/apartment Stairs: No Has following equipment at home: Single point cane, Walker - 4 wheeled, and shower chair  OCCUPATION: not working  PLOF: WAS independent, now Independent with household mobility with  device, Independent with community mobility with device, Needs assistance with ADLs, Needs assistance with homemaking, and Leisure: family time, grandkids    PATIENT GOALS: Pt would liek to get my balance together so I dont keep falling    NEXT MD VISIT: follow up   OBJECTIVE:  Note: Objective measures were completed at Evaluation unless otherwise noted.  DIAGNOSTIC FINDINGS:  XR FINDINGS: No evidence of pelvic fracture. Chronic irregularity about the right inferior pubic ramus. Pubic symphysis and sacroiliac joints are congruent. Both femoral heads are well-seated in the respective acetabula. Mild bilateral hip joint space narrowing. Lumbosacral fusion hardware, partially included.   IMPRESSION: 1. No acute fracture of the pelvis. 2. Mild bilateral hip osteoarthritis.  PATIENT SURVEYS:  LEFS TBA   COGNITION: Overall cognitive status: Within functional limits for tasks assessed     SENSATION: WFL  EDEMA:  NT   PALPATION: TTP throughout L lumbar and L posterior/lateral hip , unable to fully assess level of tension in the muscles   LOWER EXTREMITY MNF:qloo ROM   Passive ROM Right eval Left eval  Hip flexion    Hip extension    Hip abduction    Hip adduction    Hip internal rotation    Hip external rotation    Knee flexion 115 120  Knee extension 0 0  Ankle dorsiflexion    Ankle  plantarflexion    Ankle inversion    Ankle eversion     (Blank rows = not tested)  LOWER EXTREMITY MMT:  MMT Right eval Left eval  Hip flexion 3+ 4-  Hip extension    Hip abduction    Hip adduction    Hip internal rotation Full  Full no pain   Hip external rotation Full  Full no pain   Knee flexion 4 4  Knee extension 4 4  Ankle dorsiflexion    Ankle plantarflexion    Ankle inversion    Ankle eversion     (Blank rows = not tested)  LOWER EXTREMITY SPECIAL TESTS:  NT   FUNCTIONAL TESTS:  5 times sit to stand: 55 sec  2 minute walk test: 155 feet  GAIT: Distance walked: 155 Assistive device utilized: Walker - 4 wheeled Level of assistance: Modified independence Comments: short step length, safe                                                                                                                                 TREATMENT DATE:  [facility] Adult PT Treatment:                                                DATE: 04/03/24 Therapeutic Exercise: Seated h/s stretch 5 x 10 sec left LE  Seated Figure 4 stretch push and pull  Seated clam with RTB x 10  Nustep L3 UE/LE x 5 minutes STS with counter support 10 x 2   Self Care: Tennis ball for self TPR to left buttock x 3    [facility] Adult PT Treatment:                                                DATE: 03/20/24  Therapeutic Activity: 2 min walk completed  POC reviewed, MMT  HEP review and demo  Hamstring Sit to stand  Piriformis    PATIENT EDUCATION:  Education details: see above  Person educated: Patient Education method: Explanation, Demonstration, and Handouts Education comprehension: verbalized understanding, returned demonstration, and needs further education  HOME EXERCISE PROGRAM: Access Code: [email or web address] URL: [email or web address] Date: 03/20/2024 Prepared by: Delon Norma  Exercises - Seated Hamstring Stretch  - 1 x daily - 7 x weekly - 1 sets - 3 reps - 30 hold - Seated Posterior  Pelvic Tilt  - 1 x daily - 7 x weekly - 2 sets - 10 reps - 5 hold - Sit to Stand with Counter Support  - 3-5 x daily - 7 x weekly - 2 sets - 10 reps - 5 hold - Seated Figure 4 Piriformis Stretch  - 1 x daily - 7 x weekly - 1 sets - 3 reps - 30 hold-ADDED IR PULL 04/03/24  ASSESSMENT:  CLINICAL IMPRESSION: Pt reports no new falls and compliance some with HEP, wants to complete them more consistently. Helps to sit on donut to left buttock to keep pressure off. Reviewed HEP and began NUSTEP. Pt required Mod cues and did not recall her HEP. Instructed pt in self care using Tennis ball for self Trigger point release to left buttock. At end of session pt reported no increased pain.    EVAL: Patient is a 68 y.o. female who was seen today for physical therapy evaluation and treatment for muscular pain s/p fall. She has pain in her Rt knee but ROM was normal, strength symmetrical.  Pain is hip is more posterior aspect and at the proximal thigh.  She will benefit from skilled PT to further assess balance and improve general mobility. She is now mindful to use her walker at all times.   OBJECTIVE IMPAIRMENTS: Abnormal gait, cardiopulmonary status limiting activity, decreased activity tolerance, decreased balance, decreased endurance, decreased knowledge of use of DME, decreased mobility, difficulty walking, decreased ROM, decreased strength, increased fascial restrictions, postural dysfunction, obesity, and pain.   ACTIVITY LIMITATIONS: carrying, lifting, bending, sitting, standing, squatting, sleeping, stairs, transfers, bed mobility, and locomotion level  PARTICIPATION LIMITATIONS: meal prep, cleaning, interpersonal relationship, shopping, and community activity  PERSONAL FACTORS: 1-2 comorbidities: chronic neck and back pain s/p surgery are also affecting patient's functional outcome.   REHAB POTENTIAL: Good  CLINICAL DECISION MAKING: Evolving/moderate complexity  EVALUATION COMPLEXITY:  Moderate   GOALS: LONG TERM GOALS: Target date: 05/01/2024    Patient will be independent with final HEP upon discharge from PT and report consistent benefit following exercise completion.   Baseline: unknown Goal status: INITIAL  2.  Patient will improve 5 x STS to 30 sec or less, no UEs to demo decreased fall risk.  Baseline: 55 sec Goal status: INITIAL  3.  Pt will be able to demonstrate good standing balance as evidenced by balance recovery from min to mod dynamic balance challenges.  Baseline: TBA with balance assessment  Goal status: INITIAL  4.  Pt will be able to demonstrate 5/5  strength in hip/knee flexion/extension to improve transfers, gait pattern. Baseline: 3+/5 to 4/5 Goal status: INITIAL   PLAN:  PT FREQUENCY: 2x/week  PT DURATION: 6 weeks  PLANNED INTERVENTIONS: 97164- PT Re-evaluation, 97750- Physical Performance Testing, 97110-Therapeutic exercises, 97530- Therapeutic activity, [id number]- Neuromuscular re-education, 97535- Self Care, [id number]- Manual therapy, [phone]- Gait training, Patient/Family education, Balance training, Cryotherapy, and Moist heat  PLAN FOR NEXT SESSION: LEFS? Balance assessment , check HEP   Harlene Persons, PTA 04/03/24 11:41 AM Phone: [phone] Fax: [phone]

## 2024-04-03 NOTE — Telephone Encounter (Signed)
 I called the pt - no answer; and mailbox is full. I called pt's daughter, Alnisa, about glucose meter. Pt's daughter stated pt already has a meter.

## 2024-04-04 ENCOUNTER — Ambulatory Visit: Admitting: Student

## 2024-04-04 ENCOUNTER — Encounter: Payer: Self-pay | Admitting: Student

## 2024-04-04 ENCOUNTER — Other Ambulatory Visit: Payer: Self-pay | Admitting: Student

## 2024-04-04 VITALS — BP 131/68 | HR 84 | Temp 98.3°F | Ht 60.0 in | Wt 211.6 lb

## 2024-04-04 DIAGNOSIS — I10 Essential (primary) hypertension: Secondary | ICD-10-CM

## 2024-04-04 DIAGNOSIS — Z79899 Other long term (current) drug therapy: Secondary | ICD-10-CM | POA: Diagnosis not present

## 2024-04-04 DIAGNOSIS — N1832 Chronic kidney disease, stage 3b: Secondary | ICD-10-CM

## 2024-04-04 DIAGNOSIS — Z7985 Long-term (current) use of injectable non-insulin antidiabetic drugs: Secondary | ICD-10-CM | POA: Diagnosis not present

## 2024-04-04 DIAGNOSIS — Z7984 Long term (current) use of oral hypoglycemic drugs: Secondary | ICD-10-CM | POA: Diagnosis not present

## 2024-04-04 DIAGNOSIS — E1165 Type 2 diabetes mellitus with hyperglycemia: Secondary | ICD-10-CM

## 2024-04-04 DIAGNOSIS — B182 Chronic viral hepatitis C: Secondary | ICD-10-CM | POA: Diagnosis not present

## 2024-04-04 DIAGNOSIS — Z794 Long term (current) use of insulin: Secondary | ICD-10-CM

## 2024-04-04 DIAGNOSIS — G4700 Insomnia, unspecified: Secondary | ICD-10-CM

## 2024-04-04 DIAGNOSIS — E1122 Type 2 diabetes mellitus with diabetic chronic kidney disease: Secondary | ICD-10-CM

## 2024-04-04 MED ORDER — FREESTYLE LIBRE 3 PLUS SENSOR MISC: 11 refills | Status: AC

## 2024-04-04 MED ORDER — FREESTYLE LIBRE 3 READER DEVI
0 refills | Status: AC
Start: 2024-04-04 — End: ?

## 2024-04-04 MED ORDER — PEN NEEDLES 32G X 4 MM MISC: 5 refills | Status: AC

## 2024-04-04 MED ORDER — TRESIBA FLEXTOUCH 100 UNIT/ML ~~LOC~~ SOPN
15.0000 [IU] | PEN_INJECTOR | Freq: Every day | SUBCUTANEOUS | 1 refills | Status: DC
Start: 2024-04-04 — End: 2024-06-21

## 2024-04-04 NOTE — Progress Notes (Signed)
 CC: medication refill  HPI:  Kara Kelley is a 68 y.o. female living with a history stated below and presents today for medication refill. Please see problem based assessment and plan for additional details.  Type 2 diabetes mellitus: Tresiba  15 units BID, jardiance  10 mg daily, metformin  1000 mg daily, Humalog  5 units 3 times daily, Mounjaro  2.5 mg weekly Vitamin D  deficiency: Vitamin D  supplementation Hypertension: Losartan -HCTZ 100-25 mg daily GERD: Omeprazole  40 mg daily, Linzess  145 mcg daily Insomnia: Trazodone  50 mg nightly as needed Pain: Tylenol , Voltaren  gel, Cymbalta  60 mg daily Hyperlipidemia: Atorvastatin  10 mg daily Mood disorder: Wellbutrin  300 mg twice daily, Atarax  25 mg every 8 hours Asthma: Advair, albuterol  Osteoporosis: Fosamax  10 mg daily Hyperlipidemia: Lipitor 10 mg daily  Past Medical History:  Diagnosis Date   Anemia    Anxiety    Arthritis    Asthma    Bacterial vaginosis 05/16/2022   Bunion    Callus    Candida vaginitis 09/16/2022   Cholelithiasis without obstruction 04/11/2022   Chronic pain    Cocaine abuse (HCC)    COPD (chronic obstructive pulmonary disease) (HCC)    Corns and callosities    Degenerative joint disease    Depression    Diabetes mellitus    Endometrial polyp    ETOH abuse    Gall stones    Gallbladder calculus with acute cholecystitis and no obstruction 04/11/2022   GERD (gastroesophageal reflux disease)    Headache    history of Migraines   Hepatitis C    Hep C   History of adenomatous polyp of colon 04/11/2022   History of endometrial ablation 02/06/2013   Hyperlipidemia    Hypertension    Insomnia    Spondylolisthesis of lumbar region    Substance abuse (HCC)    alcoholism   Tuberculosis 1985   Wears dentures    Wears glasses     Current Outpatient Medications on File Prior to Visit  Medication Sig Dispense Refill   Accu-Chek FastClix Lancets MISC Use as directed to check blood sugars 2 times per  day dx: e11.65 102 each prn   Accu-Chek Softclix Lancets lancets Use as instructed to check blood sugars twice daily E11.69 100 each 2   acetaminophen  (TYLENOL ) 325 MG tablet Take 1-2 tablets (325-650 mg total) by mouth every 4 (four) hours as needed for mild pain.     albuterol  (VENTOLIN  HFA) 108 (90 Base) MCG/ACT inhaler Inhale 1-2 puffs into the lungs every 6 (six) hours as needed for wheezing or shortness of breath. 18 g 2   alendronate  (FOSAMAX ) 10 MG tablet Take 1 tablet (10 mg total) by mouth daily before breakfast. Take with a full glass of water on an empty stomach. 30 tablet 11   atorvastatin  (LIPITOR) 10 MG tablet Take 1 tablet (10 mg total) by mouth daily. 30 tablet 11   benzonatate  (TESSALON  PERLES) 100 MG capsule Take 1 capsule (100 mg total) by mouth 3 (three) times daily as needed for cough. 40 capsule 0   Blood Glucose Monitoring Suppl (ONETOUCH VERIO REFLECT) w/Device KIT Use to check blood sugar up to 3 times a day 1 kit 0   buPROPion  (WELLBUTRIN  XL) 300 MG 24 hr tablet Take 1 tablet (300 mg total) by mouth in the morning and at bedtime. 180 tablet 1   diclofenac  Sodium (VOLTAREN ) 1 % GEL Apply 4 g topically 4 (four) times daily. 100 g 2   docusate sodium  (COLACE) 100 MG capsule Take  1 capsule (100 mg total) by mouth every 12 (twelve) hours. 60 capsule 0   DULoxetine  (CYMBALTA ) 60 MG capsule Take 1 capsule (60 mg total) by mouth daily. 90 capsule 3   empagliflozin  (JARDIANCE ) 10 MG TABS tablet Take 1 tablet (10 mg total) by mouth daily. 30 tablet 11   fluticasone  (FLONASE ) 50 MCG/ACT nasal spray Place 1 spray into both nostrils daily. 16 g 2   fluticasone -salmeterol (ADVAIR) 250-50 MCG/ACT AEPB INHALE 1 PUFF BY MOUTH INTO LUNGS TWICE DAILY IN THE MORNING AND AT BEDTIME 60 each 2   hydrOXYzine  (ATARAX ) 25 MG tablet Take 1 tablet (25 mg total) by mouth every 8 (eight) hours as needed for anxiety. 30 tablet 1   insulin  lispro (HUMALOG  KWIKPEN) 100 UNIT/ML KwikPen ADMINISTER 5 UNITS  UNDER THE SKIN THREE TIMES DAILY WITH MEALS 3 mL 3   Insulin  Syringe-Needle U-100 (INSULIN  SYRINGE .3CC/30GX5/16) 30G X 5/16 0.3 ML MISC Use to inject Levemir  twice daily. 100 each 2   Iron , Ferrous Sulfate , 325 (65 Fe) MG TABS Take 325 mg by mouth daily. 30 tablet 2   Lancet Device MISC 1 each by Does not apply route in the morning, at noon, and at bedtime. May substitute to any manufacturer covered by patient's insurance. 100 each 0   lidocaine  (LIDODERM ) 5 % Place 1 patch onto the skin daily. Remove & Discard patch within 12 hours or as directed by MD 30 patch 0   LINZESS  145 MCG CAPS capsule Take 145 mcg by mouth daily before breakfast.      losartan -hydrochlorothiazide (HYZAAR) 100-25 MG tablet Take 1 tablet by mouth daily. 90 tablet 3   metFORMIN  (GLUCOPHAGE -XR) 500 MG 24 hr tablet Take 2 tablets (1,000 mg total) by mouth 2 (two) times daily with a meal. 120 tablet 11   Multiple Vitamins-Minerals (MULTIVITAMIN WITH MINERALS) tablet Take 1 tablet by mouth daily. Alive     omeprazole  (PRILOSEC) 40 MG capsule TAKE 1 CAPSULE(40 MG) BY MOUTH DAILY BEFORE BREAKFAST 90 capsule 3   ONETOUCH ULTRA test strip TEST AS DIRECTED UPTO 3 TIMES DAILY 100 strip 0   polyethylene glycol powder (GLYCOLAX /MIRALAX ) 17 GM/SCOOP powder Take 17 g by mouth 2 (two) times daily. Until daily soft stools OTC 238 g 0   tirzepatide  (MOUNJARO ) 2.5 MG/0.5ML Pen Inject 2.5 mg into the skin once a week. 0.5 mL 3   No current facility-administered medications on file prior to visit.    Family History  Problem Relation Age of Onset   Heart disease Mother    Diabetes Other        mat great aunt   Cirrhosis Other        mat great aunt   Cirrhosis Other        mat great uncles x 2   Colon cancer Neg Hx    Breast cancer Neg Hx     Social History   Socioeconomic History   Marital status: Single    Spouse name: Not on file   Number of children: 3   Years of education: Not on file   Highest education level: Not on  file  Occupational History   Occupation: disablily  Tobacco Use   Smoking status: Former    Current packs/day: 0.00    Average packs/day: 0.8 packs/day for 47.0 years (35.3 ttl pk-yrs)    Types: Cigarettes    Start date: 68    Quit date: 2020    Years since quitting: 5.4    Passive exposure:  Past   Smokeless tobacco: Never  Vaping Use   Vaping status: Never Used  Substance and Sexual Activity   Alcohol use: No    Alcohol/week: 0.0 standard drinks of alcohol    Comment: in rehab   Drug use: No    Comment: in rehab   Sexual activity: Not Currently    Birth control/protection: Post-menopausal  Other Topics Concern   Not on file  Social History Narrative   Not on file   Social Drivers of Health   Financial Resource Strain: Low Risk  (04/20/2023)   Overall Financial Resource Strain (CARDIA)    Difficulty of Paying Living Expenses: Not hard at all  Food Insecurity: No Food Insecurity (04/20/2023)   Hunger Vital Sign    Worried About Running Out of Food in the Last Year: Never true    Ran Out of Food in the Last Year: Never true  Transportation Needs: No Transportation Needs (04/20/2023)   PRAPARE - Administrator, Civil Service (Medical): No    Lack of Transportation (Non-Medical): No  Physical Activity: Sufficiently Active (04/20/2023)   Exercise Vital Sign    Days of Exercise per Week: 3 days    Minutes of Exercise per Session: 60 min  Stress: No Stress Concern Present (04/20/2023)   [employer] of Occupational Health - Occupational Stress Questionnaire    Feeling of Stress : Only a little  Social Connections: Moderately Integrated (04/20/2023)   Social Connection and Isolation Panel    Frequency of Communication with Friends and Family: More than three times a week    Frequency of Social Gatherings with Friends and Family: More than three times a week    Attends Religious Services: More than 4 times per year    Active Member of [employer] or Organizations:  Yes    Attends Banker Meetings: More than 4 times per year    Marital Status: Widowed  Intimate Partner Violence: Not At Risk (04/20/2023)   Humiliation, Afraid, Rape, and Kick questionnaire    Fear of Current or Ex-Partner: No    Emotionally Abused: No    Physically Abused: No    Sexually Abused: No    Review of Systems: ROS negative except for what is noted on the assessment and plan.  Vitals:   04/04/24 1003  BP: 131/68  Pulse: 84  Temp: 98.3 F (36.8 C)  TempSrc: Oral  SpO2: 100%  Weight: 211 lb 9.6 oz (96 kg)  Height: 5' (1.524 m)   Physical Exam: Constitutional: alert, sitting up comfortably, in no acute distress Cardiovascular: regular rate and rhythm Pulmonary/Chest: normal work of breathing on room air Neurological: alert & oriented x 3 Skin: warm and dry  Assessment & Plan:   Uncontrolled type 2 diabetes mellitus on insulin  (HCC) Status: uncontrolled. Last A1c of 8.1 in 02/2024.  A1c today is Not Due.  Currently taking Tresiba  20 units daily (prescribed 15 units BID), Humalog  5 units TID AC, Jardiance  10 mg, metformin  1000 mg BID, Mounjaro  2.5 mg weekly (new).  Patient endorses hypoglycemia. No meter today. Reports blood glucose this AM was 95. Daughter states patient gets 2-3 hypoglycemic episodes per week. Will decrease insulin  regimen and have close f/u in 2 weeks.  Plan -DECREASE Tresiba  to 15 units daily w/ 2 week f/u visit  -Continue Humalog  5 units TID AC (>50% meal), metformin , Jardiance   -Hold Mounjaro  next week given mild nausea, restart if tolerating  -Sent CGM: Freestyle Libre 3 Plus sensor and receiver -  A1c in 2 months  -Ophthalmology exam: patient reported [facility] 2 months ago  -Urine ACR normal 02/2023 -LDL 66 on 12/2022, taking atorvastatin  10 mg  -Check vitamin B12 today since on metformin    Essential hypertension Status: controlled. BP today 131/68. Reports taking losartan -HCTZ 100-25 mg daily.  Last BMP in 03/2024  was K 3.2, CKD 3B.   Plan -Continue: losartan -hydrochlorothiazide  -BMP today   Chronic hepatitis C (HCC) Unable to provide records. Last ID note was in 2017 by Dr. Efrain as initial visit, was to start treatment. Unable to determine if treatment completed but patient stated she did complete treatment then and followed up. Unfortunately unable to locate that. Will repeat viral load for HCV today, referral back to [facility] if indicated.   Polypharmacy Referral to [facility] for polypharmacy.    Patient discussed with Dr. Karna Ozell Nearing, D.O. [facility] Internal Medicine, PGY-2 Phone: [phone] Date 04/04/2024 Time 4:32 PM

## 2024-04-04 NOTE — Assessment & Plan Note (Addendum)
 Status: uncontrolled. Last A1c of 8.1 in 02/2024.  A1c today is Not Due.  Currently taking Tresiba  20 units daily (prescribed 15 units BID), Humalog  5 units TID AC, Jardiance  10 mg, metformin  1000 mg BID, Mounjaro  2.5 mg weekly (new).  Patient endorses hypoglycemia. No meter today. Reports blood glucose this AM was 95. Daughter states patient gets 2-3 hypoglycemic episodes per week. Will decrease insulin  regimen and have close f/u in 2 weeks.  Plan -DECREASE Tresiba  to 15 units daily w/ 2 week f/u visit  -Continue Humalog  5 units TID AC (>50% meal), metformin , Jardiance   -Hold Mounjaro  next week given mild nausea, restart if tolerating  -Sent CGM: Freestyle Libre 3 Plus sensor and receiver -A1c in 2 months  -Ophthalmology exam: patient reported [facility] 2 months ago  -Urine ACR normal 02/2023 -LDL 66 on 12/2022, taking atorvastatin  10 mg  -Check vitamin B12 today since on metformin 

## 2024-04-04 NOTE — Patient Instructions (Addendum)
 Thank you, Ms.Maisey V Umland for allowing us  to provide your care today. Today we discussed:  -DECREASE Tresiba  to 15 units once a day -Continue Humalog  5 units before meals (eating >50% of meal), metformin , Jardiance  -Hold Mounjaro  next week to see if helps with nausea -Sent over [employer] sensors and device for you to start tracking your blood sugar 24/7.  -Come back to office in 2 weeks to see how your diabetes is doing.  -Bring all your medications at that time.  -Blood work today to check electrolytes, kidneys and vitamin B12  Follow up: 2-3 weeks   Remember: Check your blood sugar every morning and with meals. Call the office if you have have low blood sugar < 70.   Should you have any questions or concerns please call the internal medicine clinic at [phone].    Almee Pelphrey, D.O. [facility]   Referral 08/2023  Dr. Onita [facility] [phone] [place], [place] [address] [phone]

## 2024-04-04 NOTE — Assessment & Plan Note (Signed)
 Status: controlled. BP today 131/68. Reports taking losartan -HCTZ 100-25 mg daily.  Last BMP in 03/2024 was K 3.2, CKD 3B.   Plan -Continue: losartan -hydrochlorothiazide  -BMP today

## 2024-04-04 NOTE — Assessment & Plan Note (Signed)
 Referral to [facility] for polypharmacy.

## 2024-04-04 NOTE — Assessment & Plan Note (Signed)
 Unable to provide records. Last ID note was in 2017 by Dr. Efrain as initial visit, was to start treatment. Unable to determine if treatment completed but patient stated she did complete treatment then and followed up. Unfortunately unable to locate that. Will repeat viral load for HCV today, referral back to [facility] if indicated.

## 2024-04-05 ENCOUNTER — Encounter: Payer: Self-pay | Admitting: Physical Therapy

## 2024-04-05 ENCOUNTER — Ambulatory Visit: Admitting: Physical Therapy

## 2024-04-05 DIAGNOSIS — R296 Repeated falls: Secondary | ICD-10-CM | POA: Diagnosis not present

## 2024-04-05 DIAGNOSIS — M25552 Pain in left hip: Secondary | ICD-10-CM | POA: Diagnosis not present

## 2024-04-05 DIAGNOSIS — M6281 Muscle weakness (generalized): Secondary | ICD-10-CM

## 2024-04-05 LAB — BMP8+ANION GAP
Anion Gap: 20 mmol/L — ABNORMAL HIGH (ref 10.0–18.0)
BUN/Creatinine Ratio: 14 (ref 12–28)
BUN: 19 mg/dL (ref 8–27)
CO2: 18 mmol/L — ABNORMAL LOW (ref 20–29)
Calcium: 9 mg/dL (ref 8.7–10.3)
Chloride: 105 mmol/L (ref 96–106)
Creatinine, Ser: 1.33 mg/dL — ABNORMAL HIGH (ref 0.57–1.00)
Glucose: 115 mg/dL — ABNORMAL HIGH (ref 70–99)
Potassium: 3.5 mmol/L (ref 3.5–5.2)
Sodium: 143 mmol/L (ref 134–144)
eGFR: 44 mL/min/{1.73_m2} — ABNORMAL LOW (ref >59)

## 2024-04-05 LAB — VITAMIN B12: Vitamin B-12: 589 pg/mL (ref 232–1245)

## 2024-04-05 NOTE — Progress Notes (Signed)
 Internal Medicine Clinic Attending  Case discussed with the resident at the time of the visit.  We reviewed the resident's history and exam and pertinent patient test results.  I agree with the assessment, diagnosis, and plan of care documented in the resident's note.

## 2024-04-05 NOTE — Therapy (Signed)
 OUTPATIENT PHYSICAL THERAPY LOWER EXTREMITY TREATMENT   Patient Name: Kara Kelley MRN: [id number] DOB:1956/02/14, 68 y.o., female Today's Date: 04/05/2024  END OF SESSION:  PT End of Session - 04/05/24 1058     Visit Number 3    Number of Visits 12    Date for PT Re-Evaluation 05/01/24    Authorization Type UHC MCR Dual complete    PT Start Time 1100    PT Stop Time 1140    PT Time Calculation (min) 40 min          Past Medical History:  Diagnosis Date   Anemia    Anxiety    Arthritis    Asthma    Bacterial vaginosis 05/16/2022   Bunion    Callus    Candida vaginitis 09/16/2022   Cholelithiasis without obstruction 04/11/2022   Chronic pain    Cocaine abuse (HCC)    COPD (chronic obstructive pulmonary disease) (HCC)    Corns and callosities    Degenerative joint disease    Depression    Diabetes mellitus    Endometrial polyp    ETOH abuse    Gall stones    Gallbladder calculus with acute cholecystitis and no obstruction 04/11/2022   GERD (gastroesophageal reflux disease)    Headache    history of Migraines   Hepatitis C    Hep C   History of adenomatous polyp of colon 04/11/2022   History of endometrial ablation 02/06/2013   Hyperlipidemia    Hypertension    Insomnia    Spondylolisthesis of lumbar region    Substance abuse (HCC)    alcoholism   Tuberculosis 1985   Wears dentures    Wears glasses    Past Surgical History:  Procedure Laterality Date   ANTERIOR CERVICAL DECOMP/DISCECTOMY FUSION N/A 10/26/2021   Procedure: ACDF - C4-C5 - C5-C6 - C6-C7;  Surgeon: Louis Shove, MD;  Location: [facility] OR;  Service: Neurosurgery;  Laterality: N/A;   BACK SURGERY  2018   CESAREAN SECTION     x3   CHOLECYSTECTOMY N/A 08/11/2016   Procedure: LAPAROSCOPIC CHOLECYSTECTOMY WITH   INTRAOPERATIVE CHOLANGIOGRAM;  Surgeon: Jina Nephew, MD;  Location: [facility] ORS;  Service: General;  Laterality: N/A;   COLONOSCOPY     Cotton Osteotomy w/ Graft Left 06/18/2009   Excision of  Benign Lesion Right 01/30/2013   Rt Plantar   FOOT SURGERY     HAMMER TOE REPAIR Right 02/12/2016   RIGHT #5   Hammertoe Repair Left 06/18/2009   Lt #5   HYSTEROSCOPY N/A 06/20/2017   Procedure: DILATION AND CURETTAGE, HYSTEROSCOPY w/ Polypectomy;  Surgeon: Corene Coy, MD;  Location: [facility]S;  Service: Gynecology;  Laterality: N/A;   LUMBAR FUSION  2018   METATARSAL OSTEOTOMY Left 06/18/2009   #5   MULTIPLE TOOTH EXTRACTIONS     Nail Matrixectomy Left 06/18/2009   LT #1   OSTEOTOMY Right 01/30/2013   Rt #5   Phalangectomy Left 06/18/2009   LT #1   Phalangectomy Right 01/30/2013   Rt #1   TUBAL LIGATION     Patient Active Problem List   Diagnosis Date Noted   Acute cough 03/22/2024   Polypharmacy 02/27/2024   Hyperlipidemia 02/27/2024   Falls, subsequent encounter 02/07/2024   Anemia 12/21/2023   Insomnia 11/22/2023   Osteoporosis 11/09/2023   Neck pain 08/23/2023   Spinal stenosis, lumbar 08/24/2022   Uncontrolled type 2 diabetes mellitus on insulin  (HCC) 04/11/2022   Morbid obesity (HCC) 04/11/2022   Polysubstance  abuse (HCC) 04/11/2022   CKD stage 3b, GFR 30-44 ml/min (HCC) 04/2022   Hyperthyroidism 12/29/2021   Cervical spondylosis with myelopathy and radiculopathy 10/26/2021   Hypertensive retinopathy of both eyes 03/06/2019   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) 03/06/2019   Essential hypertension 10/05/2018   Chronic obstructive pulmonary disease (HCC) 10/05/2018   Cramps, muscle, general 07/20/2018   Chronic hepatitis C (HCC) 12/09/2015   Gastroesophageal reflux disease 05/22/2014    PCP: Jeanelle Layman CROME, MD  REFERRING PROVIDER: Jeanelle Layman CROME, MD  REFERRING DIAG: W10.1XXA (ICD-10-CM) - Fall (on)(from) sidewalk curb, initial encounter  THERAPY DIAG:  Pain in left hip  Muscle weakness (generalized)  Repeated falls  Rationale for Evaluation and Treatment: Rehabilitation  ONSET DATE: 3 weeks   SUBJECTIVE:    SUBJECTIVE STATEMENT: The left hip is still painful. 8/10 in left buttock. I have been using the tennis ball and it helps. I have been doing the stretches.    EVAL: Daughter Kara Kelley presents with her mother for PT eval.   Pt reports about 3 weeks ago she was not paying attention while talking to someone, turned and caught her foot up.  She fell on concrete, hit her knee. Not long after that, she slipped while coming into her building. She landed on her L hip.  Both times she was not using an assistive device.  The hip is hurting a lot, Rt knee less so. She does have chronic low back pain as well which she had PT for.  She cont to have pain pain with sitting, standing, walking.  Goes to the pain clinic [facility]) for medicine.  She feels weak in both legs. Numbness in LLE the other day but not consistent.    PERTINENT HISTORY: Lumbar surgery COPD Neck surgery    MD note: Patient presents today with bilateral hip pain after falling again.  She states she slipped on a rainy day and fell on her left hip.  She since has had aching and throbbing type pains.  She was seen in the clinic for this, and was told to get Voltaren  gel and lidocaine  patches, but never did.  She states she has tried Tylenol  with no improvement.  She states it is worse when she stands up and starts moving, but gets better as she starts moving.  On my exam she does have some tenderness noted to the left hip.  She does have 4/5 strength noted on bilateral hips at hip flexion.  5/5 strength noted to knee flexion and knee extension.  5/5 strength noted to ankle dorsiflexion and plantarflexion.  No obvious bruising or rashes noted.  No obvious deformities.  Likely this is a myalgia from her fall.  I do not think she has a fracture however given history of osteoporosis we will evaluate to rule out any fracture.     W10.1XXA (ICD-10-CM) - Fall (on)(from) sidewalk curb, initial encounter PAIN:  Are you having pain? Yes:  NPRS scale: 8/10  Pain location: L hip Pain description: aching sore  Aggravating factors: standing  Relieving factors: medicine, heating pad  PRECAUTIONS: Fall  RED FLAGS: None   WEIGHT BEARING RESTRICTIONS: No  FALLS:  Has patient fallen in last 6 months? Yes. Number of falls 2  LIVING ENVIRONMENT: Lives with: lives alone Lives in: House/apartment Stairs: No Has following equipment at home: Single point cane, Walker - 4 wheeled, and shower chair  OCCUPATION: not working  PLOF: WAS independent, now Independent with household mobility with  device, Independent with community mobility with device, Needs assistance with ADLs, Needs assistance with homemaking, and Leisure: family time, grandkids    PATIENT GOALS: Pt would liek to get my balance together so I dont keep falling    NEXT MD VISIT: follow up   OBJECTIVE:  Note: Objective measures were completed at Evaluation unless otherwise noted.  DIAGNOSTIC FINDINGS:  XR FINDINGS: No evidence of pelvic fracture. Chronic irregularity about the right inferior pubic ramus. Pubic symphysis and sacroiliac joints are congruent. Both femoral heads are well-seated in the respective acetabula. Mild bilateral hip joint space narrowing. Lumbosacral fusion hardware, partially included.   IMPRESSION: 1. No acute fracture of the pelvis. 2. Mild bilateral hip osteoarthritis.  PATIENT SURVEYS:  LEFS TBA   COGNITION: Overall cognitive status: Within functional limits for tasks assessed     SENSATION: WFL  EDEMA:  NT   PALPATION: TTP throughout L lumbar and L posterior/lateral hip , unable to fully assess level of tension in the muscles   LOWER EXTREMITY MNF:qloo ROM   Passive ROM Right eval Left eval  Hip flexion    Hip extension    Hip abduction    Hip adduction    Hip internal rotation    Hip external rotation    Knee flexion 115 120  Knee extension 0 0  Ankle dorsiflexion    Ankle plantarflexion    Ankle  inversion    Ankle eversion     (Blank rows = not tested)  LOWER EXTREMITY MMT:  MMT Right eval Left eval  Hip flexion 3+ 4-  Hip extension    Hip abduction    Hip adduction    Hip internal rotation Full  Full no pain   Hip external rotation Full  Full no pain   Knee flexion 4 4  Knee extension 4 4  Ankle dorsiflexion    Ankle plantarflexion    Ankle inversion    Ankle eversion     (Blank rows = not tested)  LOWER EXTREMITY SPECIAL TESTS:  NT   FUNCTIONAL TESTS:  5 times sit to stand: 55 sec  2 minute walk test: 155 feet  GAIT: Distance walked: 155 Assistive device utilized: Walker - 4 wheeled Level of assistance: Modified independence Comments: short step length, safe                                                                                                                                 [facility] Adult PT Treatment:                                                DATE: 04/05/24 Therapeutic Exercise: Seated h/s stretch 5 x 10 sec Left LE  Seated left figure 4 push and pull left 10 sec x 5 each way  HL glut  squeeze x 5  HL mini bridge- increased back pain HL GTB clam  HL ball squeeze  HL March  LTR Supine figure 4 push and pull 10 sec  x 4 left and right  Seated lumbar flexion stretch x 3   Therapeutic Activity: Nustep L3 UE/LE x 5 minutes  STS 5 x 2 standard chair, without UE  Adjusted cane to lower setting       [facility] Adult PT Treatment:                                                DATE: 04/03/24 Therapeutic Exercise: Seated h/s stretch 5 x 10 sec left LE  Seated Figure 4 stretch push and pull  Seated clam with RTB x 10  Nustep L3 UE/LE x 5 minutes STS with counter support 10 x 2   Self Care: Tennis ball for self TPR to left buttock x 3    [facility] Adult PT Treatment:                                                DATE: 03/20/24  Therapeutic Activity: 2 min walk completed  POC reviewed, MMT  HEP review and demo  Hamstring Sit to stand   Piriformis    PATIENT EDUCATION:  Education details: see above  Person educated: Patient Education method: Explanation, Demonstration, and Handouts Education comprehension: verbalized understanding, returned demonstration, and needs further education  HOME EXERCISE PROGRAM: Access Code: [email or web address] URL: [email or web address] Date: 03/20/2024 Prepared by: Delon Norma  Exercises - Seated Hamstring Stretch  - 1 x daily - 7 x weekly - 1 sets - 3 reps - 30 hold - Seated Posterior Pelvic Tilt  - 1 x daily - 7 x weekly - 2 sets - 10 reps - 5 hold - Sit to Stand with Counter Support  - 3-5 x daily - 7 x weekly - 2 sets - 10 reps - 5 hold - Seated Figure 4 Piriformis Stretch  - 1 x daily - 7 x weekly - 1 sets - 3 reps - 30 hold-ADDED IR PULL 04/03/24  ASSESSMENT:  CLINICAL IMPRESSION: Pt reports compliance with HEP and continued pain in left buttock. Today progressed to hook lying activity which she tolerated well while in the position but noted increased lumbar pain after sitting back up.  Lumbar flexion in seated provided min relief. Better stretches felt in left buttock with hook lying, however pt may not be able to tolerate due to back pain. Will further assess response next session. She did have back pain with attempts to bridge. Needs balance assessment.     EVAL: Patient is a 68 y.o. female who was seen today for physical therapy evaluation and treatment for muscular pain s/p fall. She has pain in her Rt knee but ROM was normal, strength symmetrical.  Pain is hip is more posterior aspect and at the proximal thigh.  She will benefit from skilled PT to further assess balance and improve general mobility. She is now mindful to use her walker at all times.   OBJECTIVE IMPAIRMENTS: Abnormal gait, cardiopulmonary status limiting activity, decreased activity tolerance, decreased balance, decreased endurance, decreased knowledge of use of DME, decreased mobility, difficulty  walking, decreased ROM, decreased  strength, increased fascial restrictions, postural dysfunction, obesity, and pain.   ACTIVITY LIMITATIONS: carrying, lifting, bending, sitting, standing, squatting, sleeping, stairs, transfers, bed mobility, and locomotion level  PARTICIPATION LIMITATIONS: meal prep, cleaning, interpersonal relationship, shopping, and community activity  PERSONAL FACTORS: 1-2 comorbidities: chronic neck and back pain s/p surgery are also affecting patient's functional outcome.   REHAB POTENTIAL: Good  CLINICAL DECISION MAKING: Evolving/moderate complexity  EVALUATION COMPLEXITY: Moderate   GOALS: LONG TERM GOALS: Target date: 05/01/2024    Patient will be independent with final HEP upon discharge from PT and report consistent benefit following exercise completion.   Baseline: unknown Goal status: INITIAL  2.  Patient will improve 5 x STS to 30 sec or less, no UEs to demo decreased fall risk.  Baseline: 55 sec Goal status: INITIAL  3.  Pt will be able to demonstrate good standing balance as evidenced by balance recovery from min to mod dynamic balance challenges.  Baseline: TBA with balance assessment  Goal status: INITIAL  4.  Pt will be able to demonstrate 5/5  strength in hip/knee flexion/extension to improve transfers, gait pattern. Baseline: 3+/5 to 4/5 Goal status: INITIAL   PLAN:  PT FREQUENCY: 2x/week  PT DURATION: 6 weeks  PLANNED INTERVENTIONS: 97164- PT Re-evaluation, 97750- Physical Performance Testing, 97110-Therapeutic exercises, 97530- Therapeutic activity, [id number]- Neuromuscular re-education, 97535- Self Care, [id number]- Manual therapy, [phone]- Gait training, Patient/Family education, Balance training, Cryotherapy, and Moist heat  PLAN FOR NEXT SESSION: LEFS? Balance assessment , check HEP   Harlene Persons, PTA 04/05/24 11:50 AM Phone: [phone] Fax: [phone]

## 2024-04-06 LAB — HCV RNA QUANT: Hepatitis C Quantitation: NOT DETECTED [IU]/mL

## 2024-04-08 ENCOUNTER — Ambulatory Visit: Payer: Self-pay | Admitting: Student

## 2024-04-08 ENCOUNTER — Ambulatory Visit: Admitting: Physical Therapy

## 2024-04-09 ENCOUNTER — Telehealth: Payer: Self-pay | Admitting: *Deleted

## 2024-04-09 NOTE — Progress Notes (Signed)
 Care Guide Pharmacy Note  04/09/2024 Name: [name] MRN: [id number] DOB: 11/21/1955  Referred By: Tobie Gaines, DO Reason for referral: Complex Care Management (Initial outreach to schedule referral with PharmD [place] )   Kara Kelley is a 68 y.o. year old female who is a primary care patient of Tobie Gaines, DO.  [name] was referred to the pharmacist for assistance related to: DMII  An unsuccessful telephone outreach was attempted today to contact the patient who was referred to the pharmacy team for assistance with medication management. Additional attempts will be made to contact the patient.  Harlene Satterfield  [facility] Guide  Direct Dial : [phone]  Fax [phone]

## 2024-04-09 NOTE — Therapy (Signed)
 OUTPATIENT PHYSICAL THERAPY LOWER EXTREMITY TREATMENT   Patient Name: Kara Kelley MRN: [id number] DOB:August 05, 1956, 68 y.o., female Today's Date: 04/10/2024  END OF SESSION:  PT End of Session - 04/10/24 1102     Visit Number 4    Number of Visits 12    Date for PT Re-Evaluation 05/01/24    Authorization Type [employer] MCR Dual complete    PT Start Time 1100    PT Stop Time 1140    PT Time Calculation (min) 40 min    Activity Tolerance Patient tolerated treatment well;Patient limited by pain    Behavior During Therapy [facility] for tasks assessed/performed           Past Medical History:  Diagnosis Date   Anemia    Anxiety    Arthritis    Asthma    Bacterial vaginosis 05/16/2022   Bunion    Callus    Candida vaginitis 09/16/2022   Cholelithiasis without obstruction 04/11/2022   Chronic pain    Cocaine abuse (HCC)    COPD (chronic obstructive pulmonary disease) (HCC)    Corns and callosities    Degenerative joint disease    Depression    Diabetes mellitus    Endometrial polyp    ETOH abuse    Gall stones    Gallbladder calculus with acute cholecystitis and no obstruction 04/11/2022   GERD (gastroesophageal reflux disease)    Headache    history of Migraines   Hepatitis C    Hep C   History of adenomatous polyp of colon 04/11/2022   History of endometrial ablation 02/06/2013   Hyperlipidemia    Hypertension    Insomnia    Spondylolisthesis of lumbar region    Substance abuse (HCC)    alcoholism   Tuberculosis 1985   Wears dentures    Wears glasses    Past Surgical History:  Procedure Laterality Date   ANTERIOR CERVICAL DECOMP/DISCECTOMY FUSION N/A 10/26/2021   Procedure: ACDF - C4-C5 - C5-C6 - C6-C7;  Surgeon: Louis Shove, MD;  Location: [facility] OR;  Service: Neurosurgery;  Laterality: N/A;   BACK SURGERY  2018   CESAREAN SECTION     x3   CHOLECYSTECTOMY N/A 08/11/2016   Procedure: LAPAROSCOPIC CHOLECYSTECTOMY WITH   INTRAOPERATIVE CHOLANGIOGRAM;  Surgeon: Jina Nephew, MD;  Location: [facility] ORS;  Service: General;  Laterality: N/A;   COLONOSCOPY     Cotton Osteotomy w/ Graft Left 06/18/2009   Excision of Benign Lesion Right 01/30/2013   Rt Plantar   FOOT SURGERY     HAMMER TOE REPAIR Right 02/12/2016   RIGHT #5   Hammertoe Repair Left 06/18/2009   Lt #5   HYSTEROSCOPY N/A 06/20/2017   Procedure: DILATION AND CURETTAGE, HYSTEROSCOPY w/ Polypectomy;  Surgeon: Corene Coy, MD;  Location: [facility] ORS;  Service: Gynecology;  Laterality: N/A;   LUMBAR FUSION  2018   METATARSAL OSTEOTOMY Left 06/18/2009   #5   MULTIPLE TOOTH EXTRACTIONS     Nail Matrixectomy Left 06/18/2009   LT #1   OSTEOTOMY Right 01/30/2013   Rt #5   Phalangectomy Left 06/18/2009   LT #1   Phalangectomy Right 01/30/2013   Rt #1   TUBAL LIGATION     Patient Active Problem List   Diagnosis Date Noted   Acute cough 03/22/2024   Polypharmacy 02/27/2024   Hyperlipidemia 02/27/2024   Falls, subsequent encounter 02/07/2024   Anemia 12/21/2023   Insomnia 11/22/2023   Osteoporosis 11/09/2023   Neck pain 08/23/2023   Spinal  stenosis, lumbar 08/24/2022   Uncontrolled type 2 diabetes mellitus on insulin  (HCC) 04/11/2022   Morbid obesity (HCC) 04/11/2022   Polysubstance abuse (HCC) 04/11/2022   CKD stage 3b, GFR 30-44 ml/min (HCC) 04/2022   Hyperthyroidism 12/29/2021   Cervical spondylosis with myelopathy and radiculopathy 10/26/2021   Hypertensive retinopathy of both eyes 03/06/2019   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) 03/06/2019   Essential hypertension 10/05/2018   Chronic obstructive pulmonary disease (HCC) 10/05/2018   Cramps, muscle, general 07/20/2018   Chronic hepatitis C (HCC) 12/09/2015   Gastroesophageal reflux disease 05/22/2014    PCP: Jeanelle Layman CROME, MD  REFERRING PROVIDER: Jeanelle Layman CROME, MD  REFERRING DIAG: W10.1XXA (ICD-10-CM) - Fall (on)(from) sidewalk curb, initial encounter  THERAPY DIAG:  Pain in left  hip  Muscle weakness (generalized)  Repeated falls  Other abnormalities of gait and mobility  Rationale for Evaluation and Treatment: Rehabilitation  ONSET DATE: 3 weeks   SUBJECTIVE:   SUBJECTIVE STATEMENT: Pt reports she picked up her grandchild yesterday and that aggravated her R low back. She notes her R knee is hurting more due to the rain. Overall, her progress re: pain has been up and down.  EVAL: Daughter [name] presents with her mother for PT eval.   Pt reports about 3 weeks ago she was not paying attention while talking to someone, turned and caught her foot up.  She fell on concrete, hit her knee. Not long after that, she slipped while coming into her building. She landed on her L hip.  Both times she was not using an assistive device.  The hip is hurting a lot, Rt knee less so. She does have chronic low back pain as well which she had PT for.  She cont to have pain pain with sitting, standing, walking.  Goes to the pain clinic [facility]) for medicine.  She feels weak in both legs. Numbness in LLE the other day but not consistent.    PERTINENT HISTORY: Lumbar surgery COPD Neck surgery  MD note: Patient presents today with bilateral hip pain after falling again.  She states she slipped on a rainy day and fell on her left hip.  She since has had aching and throbbing type pains.  She was seen in the clinic for this, and was told to get Voltaren  gel and lidocaine  patches, but never did.  She states she has tried Tylenol  with no improvement.  She states it is worse when she stands up and starts moving, but gets better as she starts moving.  On my exam she does have some tenderness noted to the left hip.  She does have 4/5 strength noted on bilateral hips at hip flexion.  5/5 strength noted to knee flexion and knee extension.  5/5 strength noted to ankle dorsiflexion and plantarflexion.  No obvious bruising or rashes noted.  No obvious deformities.  Likely this is a myalgia  from her fall.  I do not think she has a fracture however given history of osteoporosis we will evaluate to rule out any fracture.     W10.1XXA (ICD-10-CM) - Fall (on)(from) sidewalk curb, initial encounter PAIN:  Are you having pain? Yes: NPRS scale: 8/10  Pain location: L hip Pain description: aching sore  Aggravating factors: standing  Relieving factors: medicine, heating pad  PRECAUTIONS: Fall  RED FLAGS: None   WEIGHT BEARING RESTRICTIONS: No  FALLS:  Has patient fallen in last 6 months? Yes. Number of falls 2  LIVING ENVIRONMENT: Lives  with: lives alone Lives in: House/apartment Stairs: No Has following equipment at home: Single point cane, Environmental consultant - 4 wheeled, and shower chair  OCCUPATION: not working  PLOF: WAS independent, now Independent with household mobility with device, Independent with community mobility with device, Needs assistance with ADLs, Needs assistance with homemaking, and Leisure: family time, grandkids    PATIENT GOALS: Pt would liek to get my balance together so I dont keep falling    NEXT MD VISIT: follow up   OBJECTIVE:  Note: Objective measures were completed at Evaluation unless otherwise noted.  DIAGNOSTIC FINDINGS:  XR FINDINGS: No evidence of pelvic fracture. Chronic irregularity about the right inferior pubic ramus. Pubic symphysis and sacroiliac joints are congruent. Both femoral heads are well-seated in the respective acetabula. Mild bilateral hip joint space narrowing. Lumbosacral fusion hardware, partially included.   IMPRESSION: 1. No acute fracture of the pelvis. 2. Mild bilateral hip osteoarthritis.  PATIENT SURVEYS:  LEFS TBA   COGNITION: Overall cognitive status: Within functional limits for tasks assessed     SENSATION: WFL  EDEMA:  NT   PALPATION: TTP throughout L lumbar and L posterior/lateral hip , unable to fully assess level of tension in the muscles   LOWER EXTREMITY MNF:qloo ROM   Passive ROM  Right eval Left eval  Hip flexion    Hip extension    Hip abduction    Hip adduction    Hip internal rotation    Hip external rotation    Knee flexion 115 120  Knee extension 0 0  Ankle dorsiflexion    Ankle plantarflexion    Ankle inversion    Ankle eversion     (Blank rows = not tested)  LOWER EXTREMITY MMT:  MMT Right eval Left eval  Hip flexion 3+ 4-  Hip extension    Hip abduction    Hip adduction    Hip internal rotation Full  Full no pain   Hip external rotation Full  Full no pain   Knee flexion 4 4  Knee extension 4 4  Ankle dorsiflexion    Ankle plantarflexion    Ankle inversion    Ankle eversion     (Blank rows = not tested)  LOWER EXTREMITY SPECIAL TESTS:  NT   FUNCTIONAL TESTS:  5 times sit to stand: 55 sec  2 minute walk test: 155 feet  GAIT: Distance walked: 155 Assistive device utilized: Environmental consultant - 4 wheeled Level of assistance: Modified independence Comments: short step length, safe    [facility] Adult PT Treatment:                                                DATE: 04/10/24 Therapeutic Exercise: Seated h/s stretch 5 x 10 sec Left LE  Seated left figure 4 push and pull left 10 sec x 5 each way  HL glut squeeze x10 5 HL mini bridgex5- increased back pain HL GTB clam x10 HL ball squeeze x15 HL March x10 GTB LTR Supine figure 4 push and pull 10 sec  x 4 left and right  Seated lumbar flexion stretch x 3  Therapeutic Activity: Nustep L4 UE/LE x 5 minutes  STS 5 x 2 standard chair, without UE (5xSTS 26)  [facility] Adult PT Treatment:                                                DATE: 04/05/24 Therapeutic Exercise: Seated h/s stretch 5 x 10 sec Left LE  Seated left figure 4 push and pull left 10 sec x 5 each way  HL glut squeeze x 5  HL mini bridge- increased back pain HL GTB clam  HL ball squeeze  HL March  LTR Supine figure 4  push and pull 10 sec  x 4 left and right  Seated lumbar flexion stretch x 3   Therapeutic Activity: Nustep L3 UE/LE x 5 minutes  STS 5 x 2 standard chair, without UE  Adjusted cane to lower setting   [facility] Adult PT Treatment:                                                DATE: 04/03/24 Therapeutic Exercise: Seated h/s stretch 5 x 10 sec left LE  Seated Figure 4 stretch push and pull  Seated clam with RTB x 10  Nustep L3 UE/LE x 5 minutes STS with counter support 10 x 2   Self Care: Tennis ball for self TPR to left buttock x 3   PATIENT EDUCATION:  Education details: see above  Person educated: Patient Education method: Explanation, Demonstration, and Handouts Education comprehension: verbalized understanding, returned demonstration, and needs further education  HOME EXERCISE PROGRAM: Access Code: [email or web address] URL: [email or web address] Date: 03/20/2024 Prepared by: Delon Norma  Exercises - Seated Hamstring Stretch  - 1 x daily - 7 x weekly - 1 sets - 3 reps - 30 hold - Seated Posterior Pelvic Tilt  - 1 x daily - 7 x weekly - 2 sets - 10 reps - 5 hold - Sit to Stand with Counter Support  - 3-5 x daily - 7 x weekly - 2 sets - 10 reps - 5 hold - Seated Figure 4 Piriformis Stretch  - 1 x daily - 7 x weekly - 1 sets - 3 reps - 30 hold-ADDED IR PULL 04/03/24  ASSESSMENT:  CLINICAL IMPRESSION: PT was continued for lumbopelvic flexibility and strengthening. Pt's reports of pain have not improved, but pt did demonstrate improved function with completing the 5XSTS significantly faster than on the eval. Pt tolerated the prescribed exercises today without adverse effects. Pt tolerated flexibility exs in sitting better. Pt will continue to benefit from skilled PT to address impairments for improved function.   EVAL: Patient is a 68 y.o. female who was seen today for physical therapy evaluation and treatment for muscular pain s/p fall. She has pain in her Rt knee but ROM was  normal, strength symmetrical.  Pain is hip is more posterior aspect and at the proximal thigh.  She will benefit from skilled PT to further assess balance and improve general mobility. She is now mindful to use her walker at all times.   OBJECTIVE IMPAIRMENTS: Abnormal gait, cardiopulmonary status limiting activity, decreased activity tolerance, decreased balance, decreased endurance, decreased knowledge of use of DME, decreased mobility, difficulty walking, decreased ROM, decreased strength, increased fascial restrictions, postural dysfunction, obesity, and pain.   ACTIVITY LIMITATIONS: carrying, lifting, bending, sitting, standing, squatting, sleeping, stairs, transfers, bed mobility,  and locomotion level  PARTICIPATION LIMITATIONS: meal prep, cleaning, interpersonal relationship, shopping, and community activity  PERSONAL FACTORS: 1-2 comorbidities: chronic neck and back pain s/p surgery are also affecting patient's functional outcome.   REHAB POTENTIAL: Good  CLINICAL DECISION MAKING: Evolving/moderate complexity  EVALUATION COMPLEXITY: Moderate   GOALS: LONG TERM GOALS: Target date: 05/01/2024    Patient will be independent with final HEP upon discharge from PT and report consistent benefit following exercise completion.   Baseline: unknown Goal status: INITIAL  2.  Patient will improve 5 x STS to 30 sec or less, no UEs to demo decreased fall risk.  Baseline: 55 sec 04/10/24: 26 s use of hands bari-chair Goal status: MET  3.  Pt will be able to demonstrate good standing balance as evidenced by balance recovery from min to mod dynamic balance challenges.  Baseline: TBA with balance assessment  Goal status: INITIAL  4.  Pt will be able to demonstrate 5/5  strength in hip/knee flexion/extension to improve transfers, gait pattern. Baseline: 3+/5 to 4/5 Goal status: INITIAL   PLAN:  PT FREQUENCY: 2x/week  PT DURATION: 6 weeks  PLANNED INTERVENTIONS: 97164- PT Re-evaluation,  97750- Physical Performance Testing, 97110-Therapeutic exercises, 97530- Therapeutic activity, [id number]- Neuromuscular re-education, 97535- Self Care, [id number]- Manual therapy, [phone]- Gait training, Patient/Family education, Balance training, Cryotherapy, and Moist heat  PLAN FOR NEXT SESSION: LEFS? Balance assessment , check HEP   Jaselyn Nahm MS, PT 04/10/24 11:47 AM

## 2024-04-10 ENCOUNTER — Telehealth: Payer: Self-pay | Admitting: *Deleted

## 2024-04-10 ENCOUNTER — Ambulatory Visit: Attending: Family Medicine

## 2024-04-10 DIAGNOSIS — R296 Repeated falls: Secondary | ICD-10-CM | POA: Diagnosis not present

## 2024-04-10 DIAGNOSIS — R2689 Other abnormalities of gait and mobility: Secondary | ICD-10-CM | POA: Diagnosis not present

## 2024-04-10 DIAGNOSIS — M6281 Muscle weakness (generalized): Secondary | ICD-10-CM | POA: Diagnosis not present

## 2024-04-10 DIAGNOSIS — M25552 Pain in left hip: Secondary | ICD-10-CM | POA: Insufficient documentation

## 2024-04-10 DIAGNOSIS — M5459 Other low back pain: Secondary | ICD-10-CM | POA: Insufficient documentation

## 2024-04-10 NOTE — Telephone Encounter (Signed)
 CORRECTION TO PREVIOUS NOTE  care provided by [facility] (not hands) [phone].

## 2024-04-10 NOTE — Telephone Encounter (Signed)
 Unable to reach the pt as her VM is full.  The pt however is sch to discuss the requested visited at the following appointment:  Name: Kara Kelley, Kara Kelley MRN: [id number]  Date: 04/18/2024 Status: Sch  Time: 1:30 PM Length: 60  Visit Type: OPEN ESTABLISHED [726] Copay: $0.00  Provider: Bernadine Manos, MD            Copied from CRM [id number]. Topic: Referral - Request for Referral >> Apr 04, 2024  2:59 PM [name] wrote: Did the patient discuss referral with their provider in the last year? Yes (If No - schedule appointment) (If Yes - send message)  Appointment offered? Yes  Type of order/referral and detailed reason for visit: Nuerologist  Preference of office, provider, location: n/a  If referral order, have you been seen by this specialty before? No (If Yes, this issue or another issue? When? Where?  Can we respond through MyChart? Yes

## 2024-04-10 NOTE — Telephone Encounter (Signed)
 Patient is receiving PCS care provided by [facility] [phone] / patient is receiving at total of 70 hours per month of care.

## 2024-04-15 DIAGNOSIS — R1011 Right upper quadrant pain: Secondary | ICD-10-CM | POA: Diagnosis not present

## 2024-04-15 DIAGNOSIS — K5900 Constipation, unspecified: Secondary | ICD-10-CM | POA: Diagnosis not present

## 2024-04-16 ENCOUNTER — Encounter: Payer: Self-pay | Admitting: Physical Therapy

## 2024-04-16 ENCOUNTER — Ambulatory Visit: Admitting: Physical Therapy

## 2024-04-16 DIAGNOSIS — M5459 Other low back pain: Secondary | ICD-10-CM | POA: Diagnosis not present

## 2024-04-16 DIAGNOSIS — M25552 Pain in left hip: Secondary | ICD-10-CM

## 2024-04-16 DIAGNOSIS — M6281 Muscle weakness (generalized): Secondary | ICD-10-CM

## 2024-04-16 DIAGNOSIS — R2689 Other abnormalities of gait and mobility: Secondary | ICD-10-CM

## 2024-04-16 DIAGNOSIS — R296 Repeated falls: Secondary | ICD-10-CM

## 2024-04-16 NOTE — Therapy (Unsigned)
 OUTPATIENT PHYSICAL THERAPY LOWER EXTREMITY TREATMENT   Patient Name: Kara Kelley MRN: [id number] DOB:1955/11/12, 68 y.o., female Today's Date: 04/16/2024  END OF SESSION:  PT End of Session - 04/16/24 1107     Visit Number 5    Number of Visits 12    Date for PT Re-Evaluation 05/01/24    Authorization Type [employer] MCR Dual complete    PT Start Time 1105    PT Stop Time 1145    PT Time Calculation (min) 40 min    Activity Tolerance Patient tolerated treatment well;Patient limited by pain    Behavior During Therapy [facility] for tasks assessed/performed            Past Medical History:  Diagnosis Date   Anemia    Anxiety    Arthritis    Asthma    Bacterial vaginosis 05/16/2022   Bunion    Callus    Candida vaginitis 09/16/2022   Cholelithiasis without obstruction 04/11/2022   Chronic pain    Cocaine abuse (HCC)    COPD (chronic obstructive pulmonary disease) (HCC)    Corns and callosities    Degenerative joint disease    Depression    Diabetes mellitus    Endometrial polyp    ETOH abuse    Gall stones    Gallbladder calculus with acute cholecystitis and no obstruction 04/11/2022   GERD (gastroesophageal reflux disease)    Headache    history of Migraines   Hepatitis C    Hep C   History of adenomatous polyp of colon 04/11/2022   History of endometrial ablation 02/06/2013   Hyperlipidemia    Hypertension    Insomnia    Spondylolisthesis of lumbar region    Substance abuse (HCC)    alcoholism   Tuberculosis 1985   Wears dentures    Wears glasses    Past Surgical History:  Procedure Laterality Date   ANTERIOR CERVICAL DECOMP/DISCECTOMY FUSION N/A 10/26/2021   Procedure: ACDF - C4-C5 - C5-C6 - C6-C7;  Surgeon: Louis Shove, MD;  Location: [facility] OR;  Service: Neurosurgery;  Laterality: N/A;   BACK SURGERY  2018   CESAREAN SECTION     x3   CHOLECYSTECTOMY N/A 08/11/2016   Procedure: LAPAROSCOPIC CHOLECYSTECTOMY WITH   INTRAOPERATIVE CHOLANGIOGRAM;  Surgeon: Jina Nephew, MD;  Location: [facility] ORS;  Service: General;  Laterality: N/A;   COLONOSCOPY     Cotton Osteotomy w/ Graft Left 06/18/2009   Excision of Benign Lesion Right 01/30/2013   Rt Plantar   FOOT SURGERY     HAMMER TOE REPAIR Right 02/12/2016   RIGHT #5   Hammertoe Repair Left 06/18/2009   Lt #5   HYSTEROSCOPY N/A 06/20/2017   Procedure: DILATION AND CURETTAGE, HYSTEROSCOPY w/ Polypectomy;  Surgeon: Corene Coy, MD;  Location: [facility] ORS;  Service: Gynecology;  Laterality: N/A;   LUMBAR FUSION  2018   METATARSAL OSTEOTOMY Left 06/18/2009   #5   MULTIPLE TOOTH EXTRACTIONS     Nail Matrixectomy Left 06/18/2009   LT #1   OSTEOTOMY Right 01/30/2013   Rt #5   Phalangectomy Left 06/18/2009   LT #1   Phalangectomy Right 01/30/2013   Rt #1   TUBAL LIGATION     Patient Active Problem List   Diagnosis Date Noted   Acute cough 03/22/2024   Polypharmacy 02/27/2024   Hyperlipidemia 02/27/2024   Falls, subsequent encounter 02/07/2024   Anemia 12/21/2023   Insomnia 11/22/2023   Osteoporosis 11/09/2023   Neck pain 08/23/2023  Spinal stenosis, lumbar 08/24/2022   Uncontrolled type 2 diabetes mellitus on insulin  (HCC) 04/11/2022   Morbid obesity (HCC) 04/11/2022   Polysubstance abuse (HCC) 04/11/2022   CKD stage 3b, GFR 30-44 ml/min (HCC) 04/2022   Hyperthyroidism 12/29/2021   Cervical spondylosis with myelopathy and radiculopathy 10/26/2021   Hypertensive retinopathy of both eyes 03/06/2019   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) 03/06/2019   Essential hypertension 10/05/2018   Chronic obstructive pulmonary disease (HCC) 10/05/2018   Cramps, muscle, general 07/20/2018   Chronic hepatitis C (HCC) 12/09/2015   Gastroesophageal reflux disease 05/22/2014    PCP: Jeanelle Layman CROME, MD  REFERRING PROVIDER: Jeanelle Layman CROME, MD  REFERRING DIAG: W10.1XXA (ICD-10-CM) - Fall (on)(from) sidewalk curb, initial encounter  THERAPY DIAG:  Pain in left  hip  Muscle weakness (generalized)  Repeated falls  Other abnormalities of gait and mobility  Rationale for Evaluation and Treatment: Rehabilitation  ONSET DATE: 3 weeks   SUBJECTIVE:   SUBJECTIVE STATEMENT: Patient reports she is feeling a bit less pain .  She has difficulty bending, lifting.  I'm walking a lot better.  I just have to pay attention.  My knee hurts all the time.   EVAL: Daughter [name] presents with her mother for PT eval.   Pt reports about 3 weeks ago she was not paying attention while talking to someone, turned and caught her foot up.  She fell on concrete, hit her knee. Not long after that, she slipped while coming into her building. She landed on her L hip.  Both times she was not using an assistive device.  The hip is hurting a lot, Rt knee less so. She does have chronic low back pain as well which she had PT for.  She cont to have pain pain with sitting, standing, walking.  Goes to the pain clinic [facility]) for medicine.  She feels weak in both legs. Numbness in LLE the other day but not consistent.    PERTINENT HISTORY: Lumbar surgery COPD Neck surgery  MD note: Patient presents today with bilateral hip pain after falling again.  She states she slipped on a rainy day and fell on her left hip.  She since has had aching and throbbing type pains.  She was seen in the clinic for this, and was told to get Voltaren  gel and lidocaine  patches, but never did.  She states she has tried Tylenol  with no improvement.  She states it is worse when she stands up and starts moving, but gets better as she starts moving.  On my exam she does have some tenderness noted to the left hip.  She does have 4/5 strength noted on bilateral hips at hip flexion.  5/5 strength noted to knee flexion and knee extension.  5/5 strength noted to ankle dorsiflexion and plantarflexion.  No obvious bruising or rashes noted.  No obvious deformities.  Likely this is a myalgia from her fall.  I  do not think she has a fracture however given history of osteoporosis we will evaluate to rule out any fracture.     W10.1XXA (ICD-10-CM) - Fall (on)(from) sidewalk curb, initial encounter PAIN:  Are you having pain? Yes: NPRS scale: 7/10  Pain location: L hip Pain description: aching sore  Aggravating factors: standing  Relieving factors: medicine, heating pad  PRECAUTIONS: Fall  RED FLAGS: None   WEIGHT BEARING RESTRICTIONS: No  FALLS:  Has patient fallen in last 6 months? Yes. Number of falls 2  LIVING ENVIRONMENT:  Lives with: lives alone Lives in: House/apartment Stairs: No Has following equipment at home: Single point cane, Environmental consultant - 4 wheeled, and shower chair  OCCUPATION: not working  PLOF: WAS independent, now Independent with household mobility with device, Independent with community mobility with device, Needs assistance with ADLs, Needs assistance with homemaking, and Leisure: family time, grandkids    PATIENT GOALS: Pt would liek to get my balance together so I dont keep falling    NEXT MD VISIT: follow up   OBJECTIVE:  Note: Objective measures were completed at Evaluation unless otherwise noted.  DIAGNOSTIC FINDINGS:  XR FINDINGS: No evidence of pelvic fracture. Chronic irregularity about the right inferior pubic ramus. Pubic symphysis and sacroiliac joints are congruent. Both femoral heads are well-seated in the respective acetabula. Mild bilateral hip joint space narrowing. Lumbosacral fusion hardware, partially included.   IMPRESSION: 1. No acute fracture of the pelvis. 2. Mild bilateral hip osteoarthritis.  PATIENT SURVEYS:  LEFS TBA   COGNITION: Overall cognitive status: Within functional limits for tasks assessed     SENSATION: WFL  EDEMA:  NT   PALPATION: TTP throughout L lumbar and L posterior/lateral hip , unable to fully assess level of tension in the muscles   LOWER EXTREMITY MNF:qloo ROM   Passive ROM Right eval Left eval   Hip flexion    Hip extension    Hip abduction    Hip adduction    Hip internal rotation    Hip external rotation    Knee flexion 115 120  Knee extension 0 0  Ankle dorsiflexion    Ankle plantarflexion    Ankle inversion    Ankle eversion     (Blank rows = not tested)  LOWER EXTREMITY MMT:  MMT Right eval Left eval  Hip flexion 3+ 4-  Hip extension    Hip abduction    Hip adduction    Hip internal rotation Full  Full no pain   Hip external rotation Full  Full no pain   Knee flexion 4 4  Knee extension 4 4  Ankle dorsiflexion    Ankle plantarflexion    Ankle inversion    Ankle eversion     (Blank rows = not tested)  LOWER EXTREMITY SPECIAL TESTS:  NT   FUNCTIONAL TESTS:  5 times sit to stand: 55 sec  2 minute walk test: 155 feet  GAIT: Distance walked: 155 Assistive device utilized: Environmental consultant - 4 wheeled Level of assistance: Modified independence Comments: short step length, safe    [facility] Adult PT Treatment:                                                DATE: 04/16/24 Therapeutic Exercise: Nustep L4 UE/LE x 5 minutes  Hamstring stretch 30 sec x  3 each side  Supine core: pelvic tilt , legs on bolster  Bridge legs on bolster  Knee to chest using towel for A  Lower trunk rotation x 10  Therapeutic Activity: TUG x 3 (20.1 sec, 18. 3 sec , 14 sec )  Standing wall sit 3 x 15 sec Heel raises 1 UE  Tandem stance   [facility] Adult PT Treatment:  DATE: 04/10/24 Therapeutic Exercise: Seated h/s stretch 5 x 10 sec Left LE  Seated left figure 4 push and pull left 10 sec x 5 each way  HL glut squeeze x10 5 HL mini bridgex5- increased back pain HL GTB clam x10 HL ball squeeze x15 HL March x10 GTB LTR Supine figure 4 push and pull 10 sec  x 4 left and right  Seated lumbar flexion stretch x 3  Therapeutic Activity: Nustep L4 UE/LE x 5 minutes  STS 5 x 2 standard chair, without UE (5xSTS 26)                                                                                                                      [facility] Adult PT Treatment:                                                DATE: 04/05/24 Therapeutic Exercise: Seated h/s stretch 5 x 10 sec Left LE  Seated left figure 4 push and pull left 10 sec x 5 each way  HL glut squeeze x 5  HL mini bridge- increased back pain HL GTB clam  HL ball squeeze  HL March  LTR Supine figure 4 push and pull 10 sec  x 4 left and right  Seated lumbar flexion stretch x 3   Therapeutic Activity: Nustep L3 UE/LE x 5 minutes  STS 5 x 2 standard chair, without UE  Adjusted cane to lower setting   [facility] Adult PT Treatment:                                                DATE: 04/03/24 Therapeutic Exercise: Seated h/s stretch 5 x 10 sec left LE  Seated Figure 4 stretch push and pull  Seated clam with RTB x 10  Nustep L3 UE/LE x 5 minutes STS with counter support 10 x 2   Self Care: Tennis ball for self TPR to left buttock x 3   PATIENT EDUCATION:  Education details: see above  Person educated: Patient Education method: Explanation, Demonstration, and Handouts Education comprehension: verbalized understanding, returned demonstration, and needs further education  HOME EXERCISE PROGRAM: Access Code: [email or web address] URL: [email or web address] Date: 03/20/2024 Prepared by: Delon Norma Access Code: [email or web address] URL: [email or web address] Date: 04/16/2024 Prepared by: Delon Norma  Exercises - Seated Hamstring Stretch  - 1 x daily - 7 x weekly - 1 sets - 3 reps - 30 hold - Seated Posterior Pelvic Tilt  - 1 x daily - 7 x weekly - 2 sets - 10 reps - 5 hold - Sit to Stand with Counter Support  - 3-5 x daily - 7 x weekly - 2 sets - 10 reps - 5 hold - Seated Figure  4 Piriformis Stretch  - 1 x daily - 7 x weekly - 1 sets - 3 reps - 30 hold - Seated Piriformis Stretch  - 1 x daily - 7 x weekly - 1 sets - 3 reps - 30 hold - Seated Hip Abduction with Resistance   - 1 x daily - 7 x weekly - 2 sets - 10 reps - 2 hold - Seated March with Resistance  - 1 x daily - 7 x weekly - 2 sets - 10 reps - 2 hold  ASSESSMENT:  CLINICAL IMPRESSION: Patient able to tolerate standing for balance and general strength. She has pain when lying on the mat and transition to sitting.  When cued for body mechanics she was able to sit without increased pain in her back. She was able to improve her TUG score each time she performed it.  She will continue to benefit from skilled PT to improve her overall balance and general mobility.   EVAL: Patient is a 67 y.o. female who was seen today for physical therapy evaluation and treatment for muscular pain s/p fall. She has pain in her Rt knee but ROM was normal, strength symmetrical.  Pain is hip is more posterior aspect and at the proximal thigh.  She will benefit from skilled PT to further assess balance and improve general mobility. She is now mindful to use her walker at all times.   OBJECTIVE IMPAIRMENTS: Abnormal gait, cardiopulmonary status limiting activity, decreased activity tolerance, decreased balance, decreased endurance, decreased knowledge of use of DME, decreased mobility, difficulty walking, decreased ROM, decreased strength, increased fascial restrictions, postural dysfunction, obesity, and pain.   ACTIVITY LIMITATIONS: carrying, lifting, bending, sitting, standing, squatting, sleeping, stairs, transfers, bed mobility, and locomotion level  PARTICIPATION LIMITATIONS: meal prep, cleaning, interpersonal relationship, shopping, and community activity  PERSONAL FACTORS: 1-2 comorbidities: chronic neck and back pain s/p surgery are also affecting patient's functional outcome.   REHAB POTENTIAL: Good  CLINICAL DECISION MAKING: Evolving/moderate complexity  EVALUATION COMPLEXITY: Moderate   GOALS: LONG TERM GOALS: Target date: 05/01/2024    Patient will be independent with final HEP upon discharge from PT and report  consistent benefit following exercise completion.   Baseline: unknown Goal status: INITIAL  2.  Patient will improve 5 x STS to 30 sec or less, no UEs to demo decreased fall risk.  Baseline: 55 sec 04/10/24: 26 s use of hands bari-chair Goal status: MET  3.  Pt will be able to demonstrate good standing balance as evidenced by balance recovery from min to mod dynamic balance challenges.  Baseline: TUG , needs 1 UE assist for tandem and narrow stance  Goal status: ongoing   4.  Pt will be able to demonstrate 5/5  strength in hip/knee flexion/extension to improve transfers, gait pattern. Baseline: 3+/5 to 4/5 Goal status: INITIAL   PLAN:  PT FREQUENCY: 2x/week  PT DURATION: 6 weeks  PLANNED INTERVENTIONS: 97164- PT Re-evaluation, 97750- Physical Performance Testing, 97110-Therapeutic exercises, 97530- Therapeutic activity, 97112- Neuromuscular re-education, 97535- Self Care, [id number]- Manual therapy, [phone]- Gait training, Patient/Family education, Balance training, Cryotherapy, and Moist heat  PLAN FOR NEXT SESSION:work on balance, build standing tolerance, hips and core   Delon Norma, PT 04/16/24 11:46 AM Phone: [phone] Fax: [phone]

## 2024-04-16 NOTE — Progress Notes (Signed)
 Care Guide Pharmacy Note  04/16/2024 Name: Kara Kelley MRN: [id number] DOB: 06/26/1956  Referred By: Tobie Gaines, DO Reason for referral: Complex Care Management (Initial outreach to schedule referral with PharmD [place] )   Kara Kelley is a 68 y.o. year old female who is a primary care patient of Tobie Gaines, DO.  Kara Kelley was referred to the pharmacist for assistance related to: DMII and Poly-pharmacy   Successful contact was made with the patient to discuss pharmacy services including being ready for the pharmacist to call at least 5 minutes before the scheduled appointment time and to have medication bottles and any blood pressure readings ready for review. The patient agreed to meet with the pharmacist via telephone visit on (date/time).7/11 at 11:00 AM  Kara Kelley  [facility], [facility] Guide  Direct Dial : [phone]  Fax [phone]

## 2024-04-17 DIAGNOSIS — Z794 Long term (current) use of insulin: Secondary | ICD-10-CM | POA: Diagnosis not present

## 2024-04-17 DIAGNOSIS — M545 Low back pain, unspecified: Secondary | ICD-10-CM | POA: Diagnosis not present

## 2024-04-17 DIAGNOSIS — K5903 Drug induced constipation: Secondary | ICD-10-CM | POA: Diagnosis not present

## 2024-04-17 DIAGNOSIS — E1122 Type 2 diabetes mellitus with diabetic chronic kidney disease: Secondary | ICD-10-CM | POA: Diagnosis not present

## 2024-04-17 DIAGNOSIS — Z9181 History of falling: Secondary | ICD-10-CM | POA: Diagnosis not present

## 2024-04-17 DIAGNOSIS — G8929 Other chronic pain: Secondary | ICD-10-CM | POA: Diagnosis not present

## 2024-04-17 DIAGNOSIS — E559 Vitamin D deficiency, unspecified: Secondary | ICD-10-CM | POA: Diagnosis not present

## 2024-04-17 DIAGNOSIS — N1831 Chronic kidney disease, stage 3a: Secondary | ICD-10-CM | POA: Diagnosis not present

## 2024-04-17 DIAGNOSIS — T402X5A Adverse effect of other opioids, initial encounter: Secondary | ICD-10-CM | POA: Diagnosis not present

## 2024-04-17 DIAGNOSIS — Z79899 Other long term (current) drug therapy: Secondary | ICD-10-CM | POA: Diagnosis not present

## 2024-04-18 ENCOUNTER — Other Ambulatory Visit (HOSPITAL_COMMUNITY)
Admission: RE | Admit: 2024-04-18 | Discharge: 2024-04-18 | Disposition: A | Discharge status: Home / Self Care | Source: Ambulatory Visit | Attending: Internal Medicine | Admitting: Internal Medicine

## 2024-04-18 ENCOUNTER — Ambulatory Visit: Admitting: Physical Therapy

## 2024-04-18 ENCOUNTER — Ambulatory Visit

## 2024-04-18 ENCOUNTER — Encounter: Payer: Self-pay | Admitting: Physical Therapy

## 2024-04-18 VITALS — BP 108/74 | HR 97 | Temp 98.2°F | Ht 60.0 in | Wt 209.4 lb

## 2024-04-18 DIAGNOSIS — M5459 Other low back pain: Secondary | ICD-10-CM | POA: Diagnosis not present

## 2024-04-18 DIAGNOSIS — M6281 Muscle weakness (generalized): Secondary | ICD-10-CM | POA: Diagnosis not present

## 2024-04-18 DIAGNOSIS — L292 Pruritus vulvae: Secondary | ICD-10-CM | POA: Diagnosis not present

## 2024-04-18 DIAGNOSIS — I10 Essential (primary) hypertension: Secondary | ICD-10-CM

## 2024-04-18 DIAGNOSIS — E1165 Type 2 diabetes mellitus with hyperglycemia: Secondary | ICD-10-CM | POA: Diagnosis not present

## 2024-04-18 DIAGNOSIS — R2689 Other abnormalities of gait and mobility: Secondary | ICD-10-CM

## 2024-04-18 DIAGNOSIS — Z7984 Long term (current) use of oral hypoglycemic drugs: Secondary | ICD-10-CM

## 2024-04-18 DIAGNOSIS — R296 Repeated falls: Secondary | ICD-10-CM | POA: Diagnosis not present

## 2024-04-18 DIAGNOSIS — N898 Other specified noninflammatory disorders of vagina: Secondary | ICD-10-CM | POA: Diagnosis not present

## 2024-04-18 DIAGNOSIS — M25552 Pain in left hip: Secondary | ICD-10-CM | POA: Diagnosis not present

## 2024-04-18 DIAGNOSIS — Z794 Long term (current) use of insulin: Secondary | ICD-10-CM

## 2024-04-18 MED ORDER — HYDROCORTISONE 1 % EX CREA: TOPICAL_CREAM | CUTANEOUS | 1 refills | Status: AC

## 2024-04-18 MED ORDER — AQUAPHOR EX OINT: TOPICAL_OINTMENT | CUTANEOUS | 0 refills | Status: AC | PRN

## 2024-04-18 NOTE — Assessment & Plan Note (Addendum)
 BP Readings from Last 3 Encounters:  04/18/24: 108/74 mmHg  04/04/24: 131/68 mmHg  03/24/24: 141/82 mmHg (elevated)  Patient denies any chest pain, headache, shortness of breath, or vision changes at home.  Physical exam of lungs and heart unremarkable.  Plan: Continue Losartan -HCTZ 100/25 mg daily.  Labs: Last BMP on 06/26 shows BUN 19, creatinine 1.3, GFR 44, potassium 3.5.

## 2024-04-18 NOTE — Therapy (Addendum)
 OUTPATIENT PHYSICAL THERAPY LOWER EXTREMITY TREATMENT DISCHARGE   Patient Name: Kara Kelley MRN: [id number] DOB:1956-05-17, 68 y.o., female Today's Date: 04/18/2024   PHYSICAL THERAPY DISCHARGE SUMMARY  Visits from Start of Care: 6  Current functional level related to goals / functional outcomes: See below   Remaining deficits: Unknown has not returned   Education / Equipment: HEP, balance    Patient agrees to discharge. Patient goals were partially met. Patient is being discharged due to not returning since the last visit.    END OF SESSION:  PT End of Session - 04/18/24 1109     Visit Number 6    Number of Visits 12    Date for PT Re-Evaluation 05/01/24    Authorization Type [employer] MCR Dual complete    PT Start Time 1103    PT Stop Time 1145    PT Time Calculation (min) 42 min    Activity Tolerance Patient tolerated treatment well;Patient limited by pain             Past Medical History:  Diagnosis Date   Anemia    Anxiety    Arthritis    Asthma    Bacterial vaginosis 05/16/2022   Bunion    Callus    Candida vaginitis 09/16/2022   Cholelithiasis without obstruction 04/11/2022   Chronic pain    Cocaine abuse (HCC)    COPD (chronic obstructive pulmonary disease) (HCC)    Corns and callosities    Degenerative joint disease    Depression    Diabetes mellitus    Endometrial polyp    ETOH abuse    Gall stones    Gallbladder calculus with acute cholecystitis and no obstruction 04/11/2022   GERD (gastroesophageal reflux disease)    Headache    history of Migraines   Hepatitis C    Hep C   History of adenomatous polyp of colon 04/11/2022   History of endometrial ablation 02/06/2013   Hyperlipidemia    Hypertension    Insomnia    Spondylolisthesis of lumbar region    Substance abuse (HCC)    alcoholism   Tuberculosis 1985   Wears dentures    Wears glasses    Past Surgical History:  Procedure Laterality Date   ANTERIOR CERVICAL  DECOMP/DISCECTOMY FUSION N/A 10/26/2021   Procedure: ACDF - C4-C5 - C5-C6 - C6-C7;  Surgeon: Kara Shove, MD;  Location: [facility] OR;  Service: Neurosurgery;  Laterality: N/A;   BACK SURGERY  2018   CESAREAN SECTION     x3   CHOLECYSTECTOMY N/A 08/11/2016   Procedure: LAPAROSCOPIC CHOLECYSTECTOMY WITH   INTRAOPERATIVE CHOLANGIOGRAM;  Surgeon: Kara Nephew, MD;  Location: [facility];  Service: General;  Laterality: N/A;   COLONOSCOPY     Cotton Osteotomy w/ Graft Left 06/18/2009   Excision of Benign Lesion Right 01/30/2013   Rt Plantar   FOOT SURGERY     HAMMER TOE REPAIR Right 02/12/2016   RIGHT #5   Hammertoe Repair Left 06/18/2009   Lt #5   HYSTEROSCOPY N/A 06/20/2017   Procedure: DILATION AND CURETTAGE, HYSTEROSCOPY w/ Polypectomy;  Surgeon: Kara Coy, MD;  Location: [facility];  Service: Gynecology;  Laterality: N/A;   LUMBAR FUSION  2018   METATARSAL OSTEOTOMY Left 06/18/2009   #5   MULTIPLE TOOTH EXTRACTIONS     Nail Matrixectomy Left 06/18/2009   LT #1   OSTEOTOMY Right 01/30/2013   Rt #5   Phalangectomy Left 06/18/2009   LT #1   Phalangectomy Right 01/30/2013  Rt #1   TUBAL LIGATION     Patient Active Problem List   Diagnosis Date Noted   Acute cough 03/22/2024   Polypharmacy 02/27/2024   Hyperlipidemia 02/27/2024   Falls, subsequent encounter 02/07/2024   Anemia 12/21/2023   Insomnia 11/22/2023   Osteoporosis 11/09/2023   Neck pain 08/23/2023   Spinal stenosis, lumbar 08/24/2022   Uncontrolled type 2 diabetes mellitus on insulin  (HCC) 04/11/2022   Morbid obesity (HCC) 04/11/2022   Polysubstance abuse (HCC) 04/11/2022   CKD stage 3b, GFR 30-44 ml/min (HCC) 04/2022   Hyperthyroidism 12/29/2021   Cervical spondylosis with myelopathy and radiculopathy 10/26/2021   Hypertensive retinopathy of both eyes 03/06/2019   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) 03/06/2019   Essential hypertension 10/05/2018   Chronic obstructive pulmonary disease (HCC)  10/05/2018   Cramps, muscle, general 07/20/2018   Chronic hepatitis C (HCC) 12/09/2015   Gastroesophageal reflux disease 05/22/2014    PCP: Kara Layman CROME, MD  REFERRING PROVIDER: Jeanelle Layman CROME, MD  REFERRING DIAG: W10.1XXA (ICD-10-CM) - Fall (on)(from) sidewalk curb, initial encounter  THERAPY DIAG:  Pain in left hip  Muscle weakness (generalized)  Repeated falls  Other abnormalities of gait and mobility  Other low back pain  Rationale for Evaluation and Treatment: Rehabilitation  ONSET DATE: 3 weeks   SUBJECTIVE:   SUBJECTIVE STATEMENT: I am sore!   EVAL: Daughter Kara Kelley presents with her mother for PT eval.   Pt reports about 3 weeks ago she was not paying attention while talking to someone, turned and caught her foot up.  She fell on concrete, hit her knee. Not long after that, she slipped while coming into her building. She landed on her L hip.  Both times she was not using an assistive device.  The hip is hurting a lot, Rt knee less so. She does have chronic low back pain as well which she had PT for.  She cont to have pain pain with sitting, standing, walking.  Goes to the pain clinic [facility] for medicine.  She feels weak in both legs. Numbness in LLE the other day but not consistent.    PERTINENT HISTORY: Lumbar surgery COPD Neck surgery  MD note: Patient presents today with bilateral hip pain after falling again.  She states she slipped on a rainy day and fell on her left hip.  She since has had aching and throbbing type pains.  She was seen in the clinic for this, and was told to get Voltaren  gel and lidocaine  patches, but never did.  She states she has tried Tylenol  with no improvement.  She states it is worse when she stands up and starts moving, but gets better as she starts moving.  On my exam she does have some tenderness noted to the left hip.  She does have 4/5 strength noted on bilateral hips at hip flexion.  5/5 strength noted  to knee flexion and knee extension.  5/5 strength noted to ankle dorsiflexion and plantarflexion.  No obvious bruising or rashes noted.  No obvious deformities.  Likely this is a myalgia from her fall.  I do not think she has a fracture however given history of osteoporosis we will evaluate to rule out any fracture.     W10.1XXA (ICD-10-CM) - Fall (on)(from) sidewalk curb, initial encounter PAIN:  Are you having pain? Yes: NPRS scale: 8/10  Pain location: L hip Pain description: aching sore  Aggravating factors: standing  Relieving factors: medicine, heating pad  PRECAUTIONS:  Fall  RED FLAGS: None   WEIGHT BEARING RESTRICTIONS: No  FALLS:  Has patient fallen in last 6 months? Yes. Number of falls 2  LIVING ENVIRONMENT: Lives with: lives alone Lives in: House/apartment Stairs: No Has following equipment at home: Single point cane, Environmental consultant - 4 wheeled, and shower chair  OCCUPATION: not working  PLOF: WAS independent, now Independent with household mobility with device, Independent with community mobility with device, Needs assistance with ADLs, Needs assistance with homemaking, and Leisure: family time, grandkids    PATIENT GOALS: Pt would liek to get my balance together so I dont keep falling    NEXT MD VISIT: follow up   OBJECTIVE:  Note: Objective measures were completed at Evaluation unless otherwise noted.  DIAGNOSTIC FINDINGS:  XR FINDINGS: No evidence of pelvic fracture. Chronic irregularity about the right inferior pubic ramus. Pubic symphysis and sacroiliac joints are congruent. Both femoral heads are well-seated in the respective acetabula. Mild bilateral hip joint space narrowing. Lumbosacral fusion hardware, partially included.   IMPRESSION: 1. No acute fracture of the pelvis. 2. Mild bilateral hip osteoarthritis.  PATIENT SURVEYS:  LEFS TBA   COGNITION: Overall cognitive status: Within functional limits for tasks  assessed     SENSATION: WFL  EDEMA:  NT   PALPATION: TTP throughout L lumbar and L posterior/lateral hip , unable to fully assess level of tension in the muscles   LOWER EXTREMITY MNF:qloo ROM   Passive ROM Right eval Left eval  Hip flexion    Hip extension    Hip abduction    Hip adduction    Hip internal rotation    Hip external rotation    Knee flexion 115 120  Knee extension 0 0  Ankle dorsiflexion    Ankle plantarflexion    Ankle inversion    Ankle eversion     (Blank rows = not tested)  LOWER EXTREMITY MMT:  MMT Right eval Left eval  Hip flexion 3+ 4-  Hip extension    Hip abduction    Hip adduction    Hip internal rotation Full  Full no pain   Hip external rotation Full  Full no pain   Knee flexion 4 4  Knee extension 4 4  Ankle dorsiflexion    Ankle plantarflexion    Ankle inversion    Ankle eversion     (Blank rows = not tested)  LOWER EXTREMITY SPECIAL TESTS:  NT   FUNCTIONAL TESTS:  5 times sit to stand: 55 sec  2 minute walk test: 155 feet  GAIT: Distance walked: 155 Assistive device utilized: Environmental consultant - 4 wheeled Level of assistance: Modified independence Comments: short step length, safe    [facility] Adult PT Treatment:                                                DATE: 04/18/24 Therapeutic Activity: Seated ball roll outs Standing balance/strength in parallel bars  Tandem Narrow Head turns  Forward and retrogait  Sidestepping Standing hip abd green band  x 10  Squat with band  x 10  Heel raises with eyes closed UE support  Sidestep with green band Forward and retrogait with green band around thighs  [facility] Adult PT Treatment:  DATE: 04/16/24 Therapeutic Exercise: Nustep L4 UE/LE x 5 minutes  Hamstring stretch 30 sec x  3 each side  Supine core: pelvic tilt , legs on bolster  Bridge legs on bolster  Knee to chest using towel for A  Lower trunk rotation x 10  Therapeutic  Activity: TUG x 3 (20.1 sec, 18. 3 sec , 14 sec )  Standing wall sit 3 x 15 sec Heel raises 1 UE  Tandem stance   [facility] Adult PT Treatment:                                                DATE: 04/10/24 Therapeutic Exercise: Seated h/s stretch 5 x 10 sec Left LE  Seated left figure 4 push and pull left 10 sec x 5 each way  HL glut squeeze x10 5 HL mini bridgex5- increased back pain HL GTB clam x10 HL ball squeeze x15 HL March x10 GTB LTR Supine figure 4 push and pull 10 sec  x 4 left and right  Seated lumbar flexion stretch x 3  Therapeutic Activity: Nustep L4 UE/LE x 5 minutes  STS 5 x 2 standard chair, without UE (5xSTS 26)                                                                                                                     [facility] Adult PT Treatment:                                                DATE: 04/05/24 Therapeutic Exercise: Seated h/s stretch 5 x 10 sec Left LE  Seated left figure 4 push and pull left 10 sec x 5 each way  HL glut squeeze x 5  HL mini bridge- increased back pain HL GTB clam  HL ball squeeze  HL March  LTR Supine figure 4 push and pull 10 sec  x 4 left and right  Seated lumbar flexion stretch x 3   Therapeutic Activity: Nustep L3 UE/LE x 5 minutes  STS 5 x 2 standard chair, without UE  Adjusted cane to lower setting   [facility] Adult PT Treatment:                                                DATE: 04/03/24 Therapeutic Exercise: Seated h/s stretch 5 x 10 sec left LE  Seated Figure 4 stretch push and pull  Seated clam with RTB x 10  Nustep L3 UE/LE x 5 minutes STS with counter support 10 x 2   Self Care: Tennis ball for self TPR to left buttock x  3   PATIENT EDUCATION:  Education details: see above  Person educated: Patient Education method: Explanation, Demonstration, and Handouts Education comprehension: verbalized understanding, returned demonstration, and needs further education  HOME EXERCISE PROGRAM: Access Code:  [email or web address] URL: [email or web address] Date: 03/20/2024 Prepared by: Delon Norma Access Code: [email or web address] URL: [email or web address] Date: 04/16/2024 Prepared by: Delon Norma  Exercises - Seated Hamstring Stretch  - 1 x daily - 7 x weekly - 1 sets - 3 reps - 30 hold - Seated Posterior Pelvic Tilt  - 1 x daily - 7 x weekly - 2 sets - 10 reps - 5 hold - Sit to Stand with Counter Support  - 3-5 x daily - 7 x weekly - 2 sets - 10 reps - 5 hold - Seated Figure 4 Piriformis Stretch  - 1 x daily - 7 x weekly - 1 sets - 3 reps - 30 hold - Seated Piriformis Stretch  - 1 x daily - 7 x weekly - 1 sets - 3 reps - 30 hold - Seated Hip Abduction with Resistance  - 1 x daily - 7 x weekly - 2 sets - 10 reps - 2 hold - Seated March with Resistance  - 1 x daily - 7 x weekly - 2 sets - 10 reps - 2 hold  ASSESSMENT:  CLINICAL IMPRESSION: Patient reports a great deal of soreness today in her lower back and left hip.  She was ready to exercise though and it did not limit what we did.  The majority of the session was spent in the parallel bars working on balance and general lower body strengthening.  She did have to at 1 point go to the bathroom as she was very nauseous, due to reflux?  She resumed exercise without a problem.  She reported the session felt like what she needed.  She reports she sometimes goes without an assistive device in her house but does use a walker when she is using the bathroom just so that she can walk a little bit faster.  She understands that she should probably use a cane in the community for safety.     EVAL: Patient is a 68 y.o. female who was seen today for physical therapy evaluation and treatment for muscular pain s/p fall. She has pain in her Rt knee but ROM was normal, strength symmetrical.  Pain is hip is more posterior aspect and at the proximal thigh.  She will benefit from skilled PT to further assess balance and improve general mobility. She is now  mindful to use her walker at all times.   OBJECTIVE IMPAIRMENTS: Abnormal gait, cardiopulmonary status limiting activity, decreased activity tolerance, decreased balance, decreased endurance, decreased knowledge of use of DME, decreased mobility, difficulty walking, decreased ROM, decreased strength, increased fascial restrictions, postural dysfunction, obesity, and pain.   ACTIVITY LIMITATIONS: carrying, lifting, bending, sitting, standing, squatting, sleeping, stairs, transfers, bed mobility, and locomotion level  PARTICIPATION LIMITATIONS: meal prep, cleaning, interpersonal relationship, shopping, and community activity  PERSONAL FACTORS: 1-2 comorbidities: chronic neck and back pain s/p surgery are also affecting patient's functional outcome.   REHAB POTENTIAL: Good  CLINICAL DECISION MAKING: Evolving/moderate complexity  EVALUATION COMPLEXITY: Moderate   GOALS: LONG TERM GOALS: Target date: 05/01/2024    Patient will be independent with final HEP upon discharge from PT and report consistent benefit following exercise completion.   Baseline: unknown Goal status: INITIAL  2.  Patient will improve 5 x STS to 30 sec or less, no UEs to demo decreased  fall risk.  Baseline: 55 sec 04/10/24: 26 s use of hands bari-chair Goal status: MET  3.  Pt will be able to demonstrate good standing balance as evidenced by balance recovery from min to mod dynamic balance challenges.  Baseline: TUG , needs 1 UE assist for tandem and narrow stance  Goal status: ongoing   4.  Pt will be able to demonstrate 5/5  strength in hip/knee flexion/extension to improve transfers, gait pattern. Baseline: 3+/5 to 4/5 Goal status: INITIAL   PLAN:  PT FREQUENCY: 2x/week  PT DURATION: 6 weeks  PLANNED INTERVENTIONS: [id number]- PT Re-evaluation, 97750- Physical Performance Testing, 97110-Therapeutic exercises, 97530- Therapeutic activity, 97112- Neuromuscular re-education, 97535- Self Care, [id number]- Manual  therapy, [phone]- Gait training, Patient/Family education, Balance training, Cryotherapy, and Moist heat  PLAN FOR NEXT SESSION: Continue to work on balance, build standing tolerance, hips and core   Delon Norma, PT 04/18/24 11:50 AM Phone: [phone] Fax: [phone]

## 2024-04-18 NOTE — Assessment & Plan Note (Signed)
 Patient reports clear vaginal discharge starting one week ago, which has been worsening, along with significant itching. She denies any new exposure to medications or clothing. There is no malodor, no cottage cheese-like discharge, and no bleeding.  On physical exam, there was no vaginal discharge or redness observed. Skin examination showed no redness, scaling, or satellite lesions.  Assessment: Irritant dermatitis of the vulva.  Plan: Started hydrocortisone  cream and petroleum jelly. To rule out Candida vulvovaginitis and bacterial vaginosis, a wet prep was performed. If results return positive, appropriate treatment will be added. Consider lichen sclerosus if symptoms persist, although the exam was not suggestive of this at present.

## 2024-04-18 NOTE — Assessment & Plan Note (Addendum)
 Patient reports no hypoglycemic episodes and no new concerns. She is taking her medications regularly. Blood sugar was 130 mg/dL.  Foot exam was performed: sensation intact, no ulcers or scratches observed. Dorsalis pedis pulses were intact and normal.  Medications:  Tresiba  15 units daily  Continue Humalog  5 units TID AC (with >50% of meals) Continue metformin  and Jardiance   Ophthalmology: Patient reported being seen at [facility]. She was advised to have them send us  the ophthalmology report.

## 2024-04-18 NOTE — Progress Notes (Signed)
 CC: vaginal discharge  HPI:  [name] is a 68 y.o. female living with a history stated below and presents today for vaginal discharge and itching which started 1 week ago. Please see problem based assessment and plan for additional details.  Past Medical History:  Diagnosis Date   Anemia    Anxiety    Arthritis    Asthma    Bacterial vaginosis 05/16/2022   Bunion    Callus    Candida vaginitis 09/16/2022   Cholelithiasis without obstruction 04/11/2022   Chronic pain    Cocaine abuse (HCC)    COPD (chronic obstructive pulmonary disease) (HCC)    Corns and callosities    Degenerative joint disease    Depression    Diabetes mellitus    Endometrial polyp    ETOH abuse    Gall stones    Gallbladder calculus with acute cholecystitis and no obstruction 04/11/2022   GERD (gastroesophageal reflux disease)    Headache    history of Migraines   Hepatitis C    Hep C   History of adenomatous polyp of colon 04/11/2022   History of endometrial ablation 02/06/2013   Hyperlipidemia    Hypertension    Insomnia    Spondylolisthesis of lumbar region    Substance abuse (HCC)    alcoholism   Tuberculosis 1985   Wears dentures    Wears glasses     Current Outpatient Medications on File Prior to Visit  Medication Sig Dispense Refill   Accu-Chek FastClix Lancets MISC Use as directed to check blood sugars 2 times per day dx: e11.65 102 each prn   Accu-Chek Softclix Lancets lancets Use as instructed to check blood sugars twice daily E11.69 100 each 2   acetaminophen  (TYLENOL ) 325 MG tablet Take 1-2 tablets (325-650 mg total) by mouth every 4 (four) hours as needed for mild pain.     albuterol  (VENTOLIN  HFA) 108 (90 Base) MCG/ACT inhaler Inhale 1-2 puffs into the lungs every 6 (six) hours as needed for wheezing or shortness of breath. 18 g 2   alendronate  (FOSAMAX ) 10 MG tablet Take 1 tablet (10 mg total) by mouth daily before breakfast. Take with a full glass of water on an  empty stomach. 30 tablet 11   atorvastatin  (LIPITOR) 10 MG tablet Take 1 tablet (10 mg total) by mouth daily. 30 tablet 11   benzonatate  (TESSALON  PERLES) 100 MG capsule Take 1 capsule (100 mg total) by mouth 3 (three) times daily as needed for cough. 40 capsule 0   Blood Glucose Monitoring Suppl (ONETOUCH VERIO REFLECT) w/Device KIT Use to check blood sugar up to 3 times a day 1 kit 0   buPROPion  (WELLBUTRIN  XL) 300 MG 24 hr tablet Take 1 tablet (300 mg total) by mouth in the morning and at bedtime. 180 tablet 1   Continuous Glucose Receiver (FREESTYLE LIBRE 3 READER) DEVI Use to check your blood sugar continuously. 1 each 0   Continuous Glucose Sensor (FREESTYLE LIBRE 3 PLUS SENSOR) MISC Change sensor every 15 days. Use to check your blood sugar continuously. 2 each 11   diclofenac  Sodium (VOLTAREN ) 1 % GEL Apply 4 g topically 4 (four) times daily. 100 g 2   docusate sodium  (COLACE) 100 MG capsule Take 1 capsule (100 mg total) by mouth every 12 (twelve) hours. 60 capsule 0   DULoxetine  (CYMBALTA ) 60 MG capsule Take 1 capsule (60 mg total) by mouth daily. 90 capsule 3   empagliflozin  (JARDIANCE ) 10 MG TABS  tablet Take 1 tablet (10 mg total) by mouth daily. 30 tablet 11   fluticasone  (FLONASE ) 50 MCG/ACT nasal spray Place 1 spray into both nostrils daily. 16 g 2   fluticasone -salmeterol (ADVAIR) 250-50 MCG/ACT AEPB INHALE 1 PUFF BY MOUTH INTO LUNGS TWICE DAILY IN THE MORNING AND AT BEDTIME 60 each 2   hydrOXYzine  (ATARAX ) 25 MG tablet Take 1 tablet (25 mg total) by mouth every 8 (eight) hours as needed for anxiety. 30 tablet 1   insulin  degludec (TRESIBA  FLEXTOUCH) 100 UNIT/ML FlexTouch Pen Inject 15 Units into the skin daily. 27 mL 1   insulin  lispro (HUMALOG  KWIKPEN) 100 UNIT/ML KwikPen ADMINISTER 5 UNITS UNDER THE SKIN THREE TIMES DAILY WITH MEALS 3 mL 3   Insulin  Pen Needle (PEN NEEDLES) 32G X 4 MM MISC Use new pen needle for each insulin  injection up to 4 times a day. 200 each 5   Insulin   Syringe-Needle U-100 (INSULIN  SYRINGE .3CC/30GX5/16) 30G X 5/16 0.3 ML MISC Use to inject Levemir  twice daily. 100 each 2   Iron , Ferrous Sulfate , 325 (65 Fe) MG TABS Take 325 mg by mouth daily. 30 tablet 2   Lancet Device MISC 1 each by Does not apply route in the morning, at noon, and at bedtime. May substitute to any manufacturer covered by patient's insurance. 100 each 0   lidocaine  (LIDODERM ) 5 % Place 1 patch onto the skin daily. Remove & Discard patch within 12 hours or as directed by MD 30 patch 0   LINZESS  145 MCG CAPS capsule Take 145 mcg by mouth daily before breakfast.      losartan -hydrochlorothiazide (HYZAAR) 100-25 MG tablet Take 1 tablet by mouth daily. 90 tablet 3   metFORMIN  (GLUCOPHAGE -XR) 500 MG 24 hr tablet Take 2 tablets (1,000 mg total) by mouth 2 (two) times daily with a meal. 120 tablet 11   Multiple Vitamins-Minerals (MULTIVITAMIN WITH MINERALS) tablet Take 1 tablet by mouth daily. Alive     omeprazole  (PRILOSEC) 40 MG capsule TAKE 1 CAPSULE(40 MG) BY MOUTH DAILY BEFORE BREAKFAST 90 capsule 3   ONETOUCH ULTRA test strip TEST AS DIRECTED UPTO 3 TIMES DAILY 100 strip 0   polyethylene glycol powder (GLYCOLAX /MIRALAX ) 17 GM/SCOOP powder Take 17 g by mouth 2 (two) times daily. Until daily soft stools OTC 238 g 0   tirzepatide  (MOUNJARO ) 2.5 MG/0.5ML Pen Inject 2.5 mg into the skin once a week. 0.5 mL 3   traZODone  (DESYREL ) 50 MG tablet TAKE 3 TABLETS(150 MG) BY MOUTH AT BEDTIME AS NEEDED FOR SLEEP 90 tablet 0   No current facility-administered medications on file prior to visit.    Family History  Problem Relation Age of Onset   Heart disease Mother    Diabetes Other        mat great aunt   Cirrhosis Other        mat great aunt   Cirrhosis Other        mat great uncles x 2   Colon cancer Neg Hx    Breast cancer Neg Hx     Social History   Socioeconomic History   Marital status: Single    Spouse name: Not on file   Number of children: 3   Years of education:  Not on file   Highest education level: Not on file  Occupational History   Occupation: disablily  Tobacco Use   Smoking status: Former    Current packs/day: 0.00    Average packs/day: 0.8 packs/day for 47.0 years (35.3  ttl pk-yrs)    Types: Cigarettes    Start date: 33    Quit date: 2020    Years since quitting: 5.5    Passive exposure: Past   Smokeless tobacco: Never  Vaping Use   Vaping status: Never Used  Substance and Sexual Activity   Alcohol use: No    Alcohol/week: 0.0 standard drinks of alcohol    Comment: in rehab   Drug use: No    Comment: in rehab   Sexual activity: Not Currently    Birth control/protection: Post-menopausal  Other Topics Concern   Not on file  Social History Narrative   Not on file   Social Drivers of Health   Financial Resource Strain: Low Risk  (04/20/2023)   Overall Financial Resource Strain (CARDIA)    Difficulty of Paying Living Expenses: Not hard at all  Food Insecurity: No Food Insecurity (04/20/2023)   Hunger Vital Sign    Worried About Running Out of Food in the Last Year: Never true    Ran Out of Food in the Last Year: Never true  Transportation Needs: No Transportation Needs (04/20/2023)   PRAPARE - Administrator, Civil Service (Medical): No    Lack of Transportation (Non-Medical): No  Physical Activity: Sufficiently Active (04/20/2023)   Exercise Vital Sign    Days of Exercise per Week: 3 days    Minutes of Exercise per Session: 60 min  Stress: No Stress Concern Present (04/20/2023)   [employer] of Occupational Health - Occupational Stress Questionnaire    Feeling of Stress : Only a little  Social Connections: Moderately Integrated (04/20/2023)   Social Connection and Isolation Panel    Frequency of Communication with Friends and Family: More than three times a week    Frequency of Social Gatherings with Friends and Family: More than three times a week    Attends Religious Services: More than 4 times per year     Active Member of [employer] or Organizations: Yes    Attends Banker Meetings: More than 4 times per year    Marital Status: Widowed  Intimate Partner Violence: Not At Risk (04/20/2023)   Humiliation, Afraid, Rape, and Kick questionnaire    Fear of Current or Ex-Partner: No    Emotionally Abused: No    Physically Abused: No    Sexually Abused: No    Review of Systems: ROS negative except for what is noted on the assessment and plan.  Vitals:   04/18/24 1336  BP: 108/74  Pulse: 97  Temp: 98.2 F (36.8 C)  TempSrc: Oral  SpO2: 97%  Weight: 209 lb 6.4 oz (95 kg)  Height: 5' (1.524 m)    Physical Exam  Physical Exam: Constitutional: well-appearing in no acute distress Cardiovascular: regular rate and rhythm, no m/r/g Pulmonary/Chest: normal work of breathing on room air, lungs clear to auscultation bilaterally Vaginal and vulvar exam:no vaginal discharge or redness observed. Skin examination showed no redness, scaling, or satellite lesions.Mild Ant wall prolapse. Foot exam: sensation intact, no ulcers or scratches observed. Dorsalis pedis pulses were intact and normal.  Assessment & Plan:   Itching in the vaginal area Patient reports clear vaginal discharge starting one week ago, which has been worsening, along with significant itching. She denies any new exposure to medications or clothing. There is no malodor, no cottage cheese-like discharge, and no bleeding.  On physical exam, there was no vaginal discharge or redness observed. Skin examination showed no redness, scaling, or satellite lesions.  Assessment: Irritant dermatitis of the vulva.  Plan: Started hydrocortisone  cream and petroleum jelly. To rule out Candida vulvovaginitis and bacterial vaginosis, a wet prep was performed. If results return positive, appropriate treatment will be added. Consider lichen sclerosus if symptoms persist, although the exam was not suggestive of this at  present.    Uncontrolled type 2 diabetes mellitus on insulin  [facility]) Patient reports no hypoglycemic episodes and no new concerns. She is taking her medications regularly. Blood sugar was 130 mg/dL.  Foot exam was performed: sensation intact, no ulcers or scratches observed. Dorsalis pedis pulses were intact and normal.  Medications:  Tresiba  15 units daily  Continue Humalog  5 units TID AC (with >50% of meals) Continue metformin  and Jardiance   Ophthalmology: Patient reported being seen at [facility]. She was advised to have them send us  the ophthalmology report.  Essential hypertension BP Readings from Last 3 Encounters:  04/18/24: 108/74 mmHg  04/04/24: 131/68 mmHg  03/24/24: 141/82 mmHg (elevated)  Patient denies any chest pain, headache, shortness of breath, or vision changes at home.  Physical exam of lungs and heart unremarkable.  Plan: Continue Losartan -HCTZ 100/25 mg daily.  Labs: Last BMP on 06/26 shows BUN 19, creatinine 1.3, GFR 44, potassium 3.5.   Patient seen with Dr. CHARLENA Rosan Armando Bernadine M.D [facility] Internal Medicine, PGY-1 Phone: [phone] Date 04/18/2024 Time 3:03 PM

## 2024-04-18 NOTE — Patient Instructions (Signed)
 Thank you, Kara Kelley. Martone, for allowing us  to provide your care today. We discussed the itching you have been experiencing, and we performed the necessary tests. There was no discharge or sign of infection observed. We have started you on hydrocortisone  cream and are currently awaiting the results of your wet prep test. Additional medication will be prescribed if needed.  Please don't hesitate to reach out if you have any questions or concerns. I have ordered the following labs for you:   Lab Orders         Urinalysis, Reflex Microscopic         POCT Wet Prep [facility])      Tests ordered today:   Referrals ordered today:   Referral Orders  No referral(s) requested today     I have ordered the following medication/changed the following medications:   Stop the following medications: There are no discontinued medications.   Start the following medications: Meds ordered this encounter  Medications   hydrocortisone  cream 1 %    Sig: Apply to affected area 2 times daily    Dispense:  30 g    Refill:  1   mineral oil-hydrophilic petrolatum  (AQUAPHOR) ointment    Sig: Apply topically as needed for dry skin.    Dispense:  420 g    Refill:  0     Follow up: 4-6 months   Remember:   Should you have any questions or concerns please call the internal medicine clinic at [phone].    Armando Rossetti, M.D [facility] Internal Medicine Center

## 2024-04-19 ENCOUNTER — Ambulatory Visit: Payer: Self-pay

## 2024-04-19 ENCOUNTER — Other Ambulatory Visit: Payer: Self-pay | Admitting: Pharmacist

## 2024-04-19 DIAGNOSIS — Z79899 Other long term (current) drug therapy: Secondary | ICD-10-CM | POA: Diagnosis not present

## 2024-04-19 DIAGNOSIS — N898 Other specified noninflammatory disorders of vagina: Secondary | ICD-10-CM

## 2024-04-19 LAB — CERVICOVAGINAL ANCILLARY ONLY
Bacterial Vaginitis (gardnerella): NEGATIVE
Candida Glabrata: NEGATIVE
Candida Vaginitis: POSITIVE — AB
Chlamydia: NEGATIVE
Comment: NEGATIVE
Comment: NEGATIVE
Comment: NEGATIVE
Comment: NEGATIVE
Comment: NEGATIVE
Comment: NORMAL
Neisseria Gonorrhea: NEGATIVE
Trichomonas: NEGATIVE

## 2024-04-19 LAB — URINALYSIS, ROUTINE W REFLEX MICROSCOPIC
Bilirubin, UA: NEGATIVE
Ketones, UA: NEGATIVE
Nitrite, UA: NEGATIVE
RBC, UA: NEGATIVE
Specific Gravity, UA: 1.014 (ref 1.005–1.030)
Urobilinogen, Ur: 0.2 mg/dL (ref 0.2–1.0)
pH, UA: 5.5 (ref 5.0–7.5)

## 2024-04-19 LAB — MICROSCOPIC EXAMINATION
Bacteria, UA: NONE SEEN
Casts: NONE SEEN /LPF
Epithelial Cells (non renal): >10 /HPF — AB (ref 0–10)
RBC, Urine: NONE SEEN /HPF (ref 0–2)

## 2024-04-19 NOTE — Progress Notes (Signed)
   04/19/2024  Patient ID: Kara Kelley, female   DOB: 1956/05/31, 68 y.o.   MRN: [id number]  Attempted to contact patient for scheduled appointment for medication management. Left HIPAA compliant text message for patient to return my call at their convenience (since mailbox was full).  Plan to discuss medication adherence (potential pill packaging) and DM today.  If patient returns my call, please forward to my number at [phone].    Aloysius Lewis, PharmD [facility]  Phone Number: [phone]

## 2024-04-20 ENCOUNTER — Other Ambulatory Visit: Payer: Self-pay | Admitting: Student

## 2024-04-20 DIAGNOSIS — E1122 Type 2 diabetes mellitus with diabetic chronic kidney disease: Secondary | ICD-10-CM

## 2024-04-22 MED ORDER — CLOTRIMAZOLE 1 % EX CREA: TOPICAL_CREAM | Freq: Two times a day (BID) | CUTANEOUS | Status: DC

## 2024-04-22 MED ORDER — FLUCONAZOLE 150 MG PO TABS: 150.0000 mg | ORAL_TABLET | Freq: Every day | ORAL | 0 refills | Status: DC

## 2024-04-22 NOTE — Progress Notes (Signed)
 Hello Kara Kelley,  I just wanted to let you know that your test came back positive for a yeast infection. I've sent in a prescription for a new topical ointment and oral tablets.  Please take one tablet now, and the second one after 72 hours. Apply the cream twice daily.  Let us  know if you have any questions or if any issues come up.

## 2024-04-24 ENCOUNTER — Telehealth: Payer: Self-pay | Admitting: *Deleted

## 2024-04-24 ENCOUNTER — Ambulatory Visit: Admitting: Physical Therapy

## 2024-04-24 NOTE — Progress Notes (Signed)
 Complex Care Management Care Guide Note  04/24/2024 Name: [name] MRN: [id number] DOB: 12-26-1955  Kara Kelley is a 68 y.o. year old female who is a primary care patient of Tobie Gaines, DO and is actively engaged with the care management team. I reached out to Kara Kelley by phone today to assist with re-scheduling  with the Pharmacist.  Follow up plan: Unsuccessful telephone outreach attempt made. A HIPAA compliant phone message was left for the patient providing contact information and requesting a return call.  Harlene Satterfield  [facility]  [facility] Guide  Direct Dial : [phone]  Fax [phone]

## 2024-04-26 NOTE — Progress Notes (Signed)
Internal Medicine Clinic Attending  I was physically present during the key portions of the resident provided service and participated in the medical decision making of patient's management care. I reviewed pertinent patient test results.  The assessment, diagnosis, and plan were formulated together and I agree with the documentation in the resident's note.  Gust Rung, DO

## 2024-04-29 NOTE — Progress Notes (Signed)
 Complex Care Management Care Guide Note  04/29/2024 Name: SINDA LEEDOM MRN: [id number] DOB: 1956/04/10  Kara Kelley is a 68 y.o. year old female who is a primary care patient of Tobie Gaines, DO and is actively engaged with the care management team. I reached out to Kara Kelley by phone today to assist with re-scheduling  with the RN Case Manager.  Follow up plan: Telephone appointment with complex care management team member scheduled for:  8/28 per patient request.   Harlene Satterfield  [facility], [facility] Guide  Direct Dial : [phone]  Fax [phone]

## 2024-05-09 ENCOUNTER — Other Ambulatory Visit: Payer: Self-pay | Admitting: *Deleted

## 2024-05-09 NOTE — Telephone Encounter (Signed)
 Copied from CRM [id number]. Topic: Clinical - Medication Question >> May 09, 2024  1:55 PM Cherylann RAMAN wrote: Reason for CRM: Patient is requesting a new prescription for tiZANidine  (ZANAFLEX ) 2 MG tablet. Please contact patient at [phone].  [facility] [phone] - [place], [place] - [address] AT [facility] [address]  [place] [address] Phone: [phone] Fax: [phone] Hours: Not open 24 hours

## 2024-05-09 NOTE — Telephone Encounter (Signed)
 Tizanidine  had expired; fallen off current med list. LOV 04/18/24.

## 2024-05-13 ENCOUNTER — Telehealth: Payer: Self-pay | Admitting: *Deleted

## 2024-05-13 DIAGNOSIS — Z9181 History of falling: Secondary | ICD-10-CM | POA: Diagnosis not present

## 2024-05-13 DIAGNOSIS — Z794 Long term (current) use of insulin: Secondary | ICD-10-CM | POA: Diagnosis not present

## 2024-05-13 DIAGNOSIS — E1122 Type 2 diabetes mellitus with diabetic chronic kidney disease: Secondary | ICD-10-CM | POA: Diagnosis not present

## 2024-05-13 DIAGNOSIS — K5903 Drug induced constipation: Secondary | ICD-10-CM | POA: Diagnosis not present

## 2024-05-13 DIAGNOSIS — M545 Low back pain, unspecified: Secondary | ICD-10-CM | POA: Diagnosis not present

## 2024-05-13 DIAGNOSIS — G8929 Other chronic pain: Secondary | ICD-10-CM | POA: Diagnosis not present

## 2024-05-13 DIAGNOSIS — Z79899 Other long term (current) drug therapy: Secondary | ICD-10-CM | POA: Diagnosis not present

## 2024-05-13 DIAGNOSIS — T402X5A Adverse effect of other opioids, initial encounter: Secondary | ICD-10-CM | POA: Diagnosis not present

## 2024-05-13 DIAGNOSIS — E559 Vitamin D deficiency, unspecified: Secondary | ICD-10-CM | POA: Diagnosis not present

## 2024-05-13 DIAGNOSIS — N1831 Chronic kidney disease, stage 3a: Secondary | ICD-10-CM | POA: Diagnosis not present

## 2024-05-13 NOTE — Telephone Encounter (Signed)
 RTC to patient's daughter unable to leave a message to find out what the patient's insurance will cover. Copied from CRM [id number]. Topic: Clinical - Prescription Issue >> May 10, 2024  9:57 AM Zane F wrote: Reason for CRM:   Patient is contacting the office to inform them that her [employer] recently sent her a letter detailing that starting in the month of August the patient's diabetic support supplies will no longer be covered by them. The patient is requesting that alternatives for all diabetic support supplies (receiver, sensor, pen needles, flex touch pens) be setup that are covered by her insurance. The patient wants the office to know she has all items listed as of right now from the previous prescriptions put in on 04/04/2024.   Please callback the patient's daughter Dennis Pines at [phone] for more clarification.   All new prescriptions need to be sent to:   [facility] [phone] - [place], [place] - [address] AT [facility] [address] [place] [address] Phone: [phone] Fax: [phone] Hours: Not open 24 hours

## 2024-05-15 DIAGNOSIS — Z79899 Other long term (current) drug therapy: Secondary | ICD-10-CM | POA: Diagnosis not present

## 2024-05-28 ENCOUNTER — Other Ambulatory Visit: Payer: Self-pay | Admitting: Student

## 2024-05-28 DIAGNOSIS — E1122 Type 2 diabetes mellitus with diabetic chronic kidney disease: Secondary | ICD-10-CM

## 2024-05-28 NOTE — Telephone Encounter (Signed)
 Received a fax from the pharmacy regarding a rx for onetouch test strips. Per the pharmacy patient plan does not cover the medication prescribed. Please send in a alternative rx.

## 2024-05-30 ENCOUNTER — Other Ambulatory Visit: Payer: Self-pay | Admitting: Student

## 2024-05-30 DIAGNOSIS — G4700 Insomnia, unspecified: Secondary | ICD-10-CM

## 2024-05-30 MED ORDER — TRAZODONE HCL 50 MG PO TABS: 50.0000 mg | ORAL_TABLET | Freq: Every evening | ORAL | 0 refills | Status: DC | PRN

## 2024-05-30 NOTE — Telephone Encounter (Signed)
 Copied from CRM #[id number]. Topic: Clinical - Medication Refill >> May 30, 2024  2:38 PM Susanna ORN wrote: Medication: traZODone  (DESYREL ) 50 MG tablet  Has the patient contacted their pharmacy? No (Agent: If no, request that the patient contact the pharmacy for the refill. If patient does not wish to contact the pharmacy document the reason why and proceed with request.) (Agent: If yes, when and what did the pharmacy advise?)  This is the patient's preferred pharmacy:   [facility] [phone] - [place], [place] - [address] AT [facility] [address]  [place] [address] Phone: [phone] Fax: [phone]  Is this the correct pharmacy for this prescription? Yes If no, delete pharmacy and type the correct one.   Has the prescription been filled recently? Yes  Is the patient out of the medication? Yes, patient states she's completely out and has none for tonight.  Has the patient been seen for an appointment in the last year OR does the patient have an upcoming appointment? Yes  Can we respond through MyChart? Yes  Agent: Please be advised that Rx refills may take up to 3 business days. We ask that you follow-up with your pharmacy.

## 2024-05-31 DIAGNOSIS — R262 Difficulty in walking, not elsewhere classified: Secondary | ICD-10-CM | POA: Diagnosis not present

## 2024-05-31 DIAGNOSIS — M25661 Stiffness of right knee, not elsewhere classified: Secondary | ICD-10-CM | POA: Diagnosis not present

## 2024-05-31 DIAGNOSIS — M25561 Pain in right knee: Secondary | ICD-10-CM | POA: Diagnosis not present

## 2024-05-31 DIAGNOSIS — M1711 Unilateral primary osteoarthritis, right knee: Secondary | ICD-10-CM | POA: Diagnosis not present

## 2024-06-05 ENCOUNTER — Encounter: Payer: Self-pay | Admitting: Student

## 2024-06-05 ENCOUNTER — Ambulatory Visit: Admitting: Student

## 2024-06-05 ENCOUNTER — Ambulatory Visit: Payer: Self-pay | Admitting: Student

## 2024-06-05 ENCOUNTER — Ambulatory Visit: Payer: Self-pay

## 2024-06-05 VITALS — BP 127/78 | HR 75 | Temp 98.2°F | Ht 61.0 in | Wt 203.6 lb

## 2024-06-05 DIAGNOSIS — E1165 Type 2 diabetes mellitus with hyperglycemia: Secondary | ICD-10-CM | POA: Diagnosis not present

## 2024-06-05 DIAGNOSIS — M48061 Spinal stenosis, lumbar region without neurogenic claudication: Secondary | ICD-10-CM | POA: Diagnosis not present

## 2024-06-05 DIAGNOSIS — M810 Age-related osteoporosis without current pathological fracture: Secondary | ICD-10-CM | POA: Diagnosis not present

## 2024-06-05 DIAGNOSIS — Z7985 Long-term (current) use of injectable non-insulin antidiabetic drugs: Secondary | ICD-10-CM

## 2024-06-05 DIAGNOSIS — Z794 Long term (current) use of insulin: Secondary | ICD-10-CM | POA: Diagnosis not present

## 2024-06-05 DIAGNOSIS — Z7984 Long term (current) use of oral hypoglycemic drugs: Secondary | ICD-10-CM | POA: Diagnosis not present

## 2024-06-05 DIAGNOSIS — E785 Hyperlipidemia, unspecified: Secondary | ICD-10-CM

## 2024-06-05 DIAGNOSIS — W19XXXD Unspecified fall, subsequent encounter: Secondary | ICD-10-CM | POA: Diagnosis not present

## 2024-06-05 DIAGNOSIS — G4700 Insomnia, unspecified: Secondary | ICD-10-CM | POA: Diagnosis not present

## 2024-06-05 DIAGNOSIS — M818 Other osteoporosis without current pathological fracture: Secondary | ICD-10-CM

## 2024-06-05 DIAGNOSIS — I10 Essential (primary) hypertension: Secondary | ICD-10-CM | POA: Diagnosis not present

## 2024-06-05 MED ORDER — ATORVASTATIN CALCIUM 10 MG PO TABS: 10.0000 mg | ORAL_TABLET | Freq: Every day | ORAL | 11 refills | Status: DC

## 2024-06-05 MED ORDER — ALENDRONATE SODIUM 10 MG PO TABS: 10.0000 mg | ORAL_TABLET | Freq: Every day | ORAL | 11 refills | Status: DC

## 2024-06-05 MED ORDER — TRAZODONE HCL 50 MG PO TABS: 50.0000 mg | ORAL_TABLET | Freq: Every evening | ORAL | 0 refills | Status: DC | PRN

## 2024-06-05 MED ORDER — BUPROPION HCL ER (XL) 300 MG PO TB24: 300.0000 mg | ORAL_TABLET | Freq: Two times a day (BID) | ORAL | 1 refills | Status: DC

## 2024-06-05 MED ORDER — SEMAGLUTIDE(0.25 OR 0.5MG/DOS) 2 MG/3ML ~~LOC~~ SOPN
0.2500 mg | PEN_INJECTOR | SUBCUTANEOUS | 3 refills | Status: DC
Start: 2024-06-05 — End: 2024-06-06

## 2024-06-05 NOTE — Telephone Encounter (Signed)
 Pt states that she has been having more falls, last one was 2 weeks ago, pt is requesting a scooter. Pt also would like to be switched back to Ozempic .

## 2024-06-05 NOTE — Telephone Encounter (Signed)
 FYI Only or Action Required?: FYI only for provider.  Patient was last seen in primary care on 04/18/2024 by Bernadine Manos, MD.  Called Nurse Triage reporting Fall.  Symptoms began several weeks ago.  Interventions attempted: Prescription medications: hydrocodone .  Symptoms are: unchanged.  Triage Disposition: See PCP Within 2 Weeks  Patient/caregiver understands and will follow disposition?: Yes     Reason for Disposition  [1] Falling (two or more unexpected falls) AND [2] in past year  Answer Assessment - Initial Assessment Questions 1. MECHANISM: How did the fall happen?     Just falls  2. DOMESTIC VIOLENCE AND ELDER ABUSE SCREENING: Did you fall because someone pushed you or tried to hurt you? If Yes, ask: Are you safe now?     no 3. ONSET: When did the fall happen? (e.g., minutes, hours, or days ago)     2 weeks ago  4. LOCATION: What part of the body hit the ground? (e.g., back, buttocks, head, hips, knees, hands, head, stomach)     hip 5. INJURY: Did you hurt (injure) yourself when you fell? If Yes, ask: What did you injure? Tell me more about this? (e.g., body area; type of injury; pain severity)     hip 6. PAIN: Is there any pain? If Yes, ask: How bad is the pain? (e.g., Scale 0-10; or none, mild,      8/10 7. SIZE: For cuts, bruises, or swelling, ask: How large is it? (e.g., inches or centimeters)      no  9. OTHER SYMPTOMS: Do you have any other symptoms? (e.g., dizziness, fever, weakness; new-onset or worsening).      no 10. CAUSE: What do you think caused the fall (or falling)? (e.g., dizzy spell, tripped)       Just falls due to balance  Protocols used: Falls and [facility]

## 2024-06-05 NOTE — Assessment & Plan Note (Signed)
 No acute concerns.  Patient doing well with this medication.  Plan: - Refill alendronate  10 mg daily

## 2024-06-05 NOTE — Assessment & Plan Note (Addendum)
 Patient was seen by me for bilateral hip pain and falls back in May 2025.  She returns again today with concerns of falls again.  This time this is not pain related.  She has been to physical therapy, but reports she has still been having some falls.  Her last fall was last month.  She states she missed a step and fell on the curb.  She states that she has felt like her legs have been giving out.  She denies any blackout episodes.  She denies any vision changes, tremors, palpitations, chest pain, sweating, headaches, or presyncopal episodes.  She denies any urinary incontinence, tongue biting, or hypoglycemic episodes.  On my exam, patient does have right lower extremity weakness. When compared with the left, right has more weakness.  She has had MRI findings previously showing significant left spinal stenosis, but not a lot of right.  I wonder if her symptoms have progressed.  Will evaluate with MRI.  Did also consider polymyalgia rheumatica, polymyositis, but given equal strength to upper extremities, do not think this is related.  Also patient has more weakness to right lower extremity compared to left where his autoimmune disease would likely be bilateral.  Plan: - Follow-up MRI lumbar without contrast - DME scooter

## 2024-06-05 NOTE — Assessment & Plan Note (Signed)
 Blood pressure on exam today 127/78.  This is at goal.  Continue losartan -HCTZ 100-25 mg daily.  Plan: - Continue losartan -HCTZ 100-25 mg daily

## 2024-06-05 NOTE — Assessment & Plan Note (Signed)
 No acute concerns.  Plan: - Refilled trazodone  dose

## 2024-06-05 NOTE — Assessment & Plan Note (Signed)
 Patient has a past medical history of diabetes. Her current medications included metformin  1000 mg BID, humalog  5 units TID with meals, tresiba  15 units daily, Mounjaro . A1c 3 months ago at 8.1. Needs A1c at next visit.   Plan: - Change Mounjaro  to ozempic  as patient is not able to tolerate Ozempic  - Continue tresiba  15 units  - Continue Humalog  5 units TID - Continue metformin  1000 mg BID  - A1c check at next visit

## 2024-06-05 NOTE — Progress Notes (Addendum)
 CC: Falls  HPI:  Ms.Nekisha V Hottle is a 68 y.o. female with history of back GERD, hypothyroidism, type 2 diabetes, back pain, COPD, hypertension who presents with concerns of multiple falls and lower extremity weakness.  Please see assessment and plan for full HPI.  Past Medical History:  Diagnosis Date   Anemia    Anxiety    Arthritis    Asthma    Bacterial vaginosis 05/16/2022   Bunion    Callus    Candida vaginitis 09/16/2022   Cholelithiasis without obstruction 04/11/2022   Chronic pain    Cocaine abuse (HCC)    COPD (chronic obstructive pulmonary disease) (HCC)    Corns and callosities    Degenerative joint disease    Depression    Diabetes mellitus    Endometrial polyp    ETOH abuse    Gall stones    Gallbladder calculus with acute cholecystitis and no obstruction 04/11/2022   GERD (gastroesophageal reflux disease)    Headache    history of Migraines   Hepatitis C    Hep C   History of adenomatous polyp of colon 04/11/2022   History of endometrial ablation 02/06/2013   Hyperlipidemia    Hypertension    Insomnia    Spondylolisthesis of lumbar region    Substance abuse (HCC)    alcoholism   Tuberculosis 1985   Wears dentures    Wears glasses      Current Outpatient Medications:    Semaglutide ,0.25 or 0.5MG /DOS, 2 MG/3ML SOPN, Inject 0.25 mg into the skin once a week., Disp: 3 mL, Rfl: 3   Accu-Chek FastClix Lancets MISC, Use as directed to check blood sugars 2 times per day dx: e11.65, Disp: 102 each, Rfl: prn   Accu-Chek Softclix Lancets lancets, Use as instructed to check blood sugars twice daily E11.69, Disp: 100 each, Rfl: 2   acetaminophen  (TYLENOL ) 325 MG tablet, Take 1-2 tablets (325-650 mg total) by mouth every 4 (four) hours as needed for mild pain., Disp: , Rfl:    albuterol  (VENTOLIN  HFA) 108 (90 Base) MCG/ACT inhaler, Inhale 1-2 puffs into the lungs every 6 (six) hours as needed for wheezing or shortness of breath., Disp: 18 g, Rfl: 2    alendronate  (FOSAMAX ) 10 MG tablet, Take 1 tablet (10 mg total) by mouth daily before breakfast. Take with a full glass of water on an empty stomach., Disp: 30 tablet, Rfl: 11   atorvastatin  (LIPITOR) 10 MG tablet, Take 1 tablet (10 mg total) by mouth daily., Disp: 30 tablet, Rfl: 11   benzonatate  (TESSALON  PERLES) 100 MG capsule, Take 1 capsule (100 mg total) by mouth 3 (three) times daily as needed for cough., Disp: 40 capsule, Rfl: 0   Blood Glucose Monitoring Suppl (ONETOUCH VERIO REFLECT) w/Device KIT, Use to check blood sugar up to 3 times a day, Disp: 1 kit, Rfl: 0   buPROPion  (WELLBUTRIN  XL) 300 MG 24 hr tablet, Take 1 tablet (300 mg total) by mouth in the morning and at bedtime., Disp: 180 tablet, Rfl: 1   Continuous Glucose Receiver (FREESTYLE LIBRE 3 READER) DEVI, Use to check your blood sugar continuously., Disp: 1 each, Rfl: 0   Continuous Glucose Sensor (FREESTYLE LIBRE 3 PLUS SENSOR) MISC, Change sensor every 15 days. Use to check your blood sugar continuously., Disp: 2 each, Rfl: 11   diclofenac  Sodium (VOLTAREN ) 1 % GEL, Apply 4 g topically 4 (four) times daily., Disp: 100 g, Rfl: 2   docusate sodium  (COLACE) 100 MG capsule,  Take 1 capsule (100 mg total) by mouth every 12 (twelve) hours., Disp: 60 capsule, Rfl: 0   DULoxetine  (CYMBALTA ) 60 MG capsule, Take 1 capsule (60 mg total) by mouth daily., Disp: 90 capsule, Rfl: 3   empagliflozin  (JARDIANCE ) 10 MG TABS tablet, Take 1 tablet (10 mg total) by mouth daily., Disp: 30 tablet, Rfl: 11   fluconazole  (DIFLUCAN ) 150 MG tablet, Take 1 tablet (150 mg total) by mouth daily., Disp: 2 tablet, Rfl: 0   fluticasone  (FLONASE ) 50 MCG/ACT nasal spray, Place 1 spray into both nostrils daily., Disp: 16 g, Rfl: 2   fluticasone -salmeterol (ADVAIR) 250-50 MCG/ACT AEPB, INHALE 1 PUFF BY MOUTH INTO LUNGS TWICE DAILY IN THE MORNING AND AT BEDTIME, Disp: 60 each, Rfl: 2   glucose blood (ONETOUCH ULTRA) test strip, TEST AS DIRECTED UPTO 3 TIMES DAILY, Disp:  100 strip, Rfl: 1   insulin  degludec (TRESIBA  FLEXTOUCH) 100 UNIT/ML FlexTouch Pen, Inject 15 Units into the skin daily., Disp: 27 mL, Rfl: 1   insulin  lispro (HUMALOG  KWIKPEN) 100 UNIT/ML KwikPen, ADMINISTER 5 UNITS UNDER THE SKIN THREE TIMES DAILY WITH MEALS, Disp: 3 mL, Rfl: 3   Insulin  Pen Needle (PEN NEEDLES) 32G X 4 MM MISC, Use new pen needle for each insulin  injection up to 4 times a day., Disp: 200 each, Rfl: 5   Insulin  Syringe-Needle U-100 (INSULIN  SYRINGE .3CC/30GX5/16) 30G X 5/16 0.3 ML MISC, Use to inject Levemir  twice daily., Disp: 100 each, Rfl: 2   Iron , Ferrous Sulfate , 325 (65 Fe) MG TABS, Take 325 mg by mouth daily., Disp: 30 tablet, Rfl: 2   Lancet Device MISC, 1 each by Does not apply route in the morning, at noon, and at bedtime. May substitute to any manufacturer covered by patient's insurance., Disp: 100 each, Rfl: 0   lidocaine  (LIDODERM ) 5 %, Place 1 patch onto the skin daily. Remove & Discard patch within 12 hours or as directed by MD, Disp: 30 patch, Rfl: 0   LINZESS  145 MCG CAPS capsule, Take 145 mcg by mouth daily before breakfast. , Disp: , Rfl:    losartan -hydrochlorothiazide (HYZAAR) 100-25 MG tablet, Take 1 tablet by mouth daily., Disp: 90 tablet, Rfl: 3   metFORMIN  (GLUCOPHAGE -XR) 500 MG 24 hr tablet, Take 2 tablets (1,000 mg total) by mouth 2 (two) times daily with a meal., Disp: 120 tablet, Rfl: 11   mineral oil-hydrophilic petrolatum  (AQUAPHOR) ointment, Apply topically as needed for dry skin., Disp: 420 g, Rfl: 0   Multiple Vitamins-Minerals (MULTIVITAMIN WITH MINERALS) tablet, Take 1 tablet by mouth daily. Alive, Disp: , Rfl:    omeprazole  (PRILOSEC) 40 MG capsule, TAKE 1 CAPSULE(40 MG) BY MOUTH DAILY BEFORE BREAKFAST, Disp: 90 capsule, Rfl: 3   polyethylene glycol powder (GLYCOLAX /MIRALAX ) 17 GM/SCOOP powder, Take 17 g by mouth 2 (two) times daily. Until daily soft stools OTC, Disp: 238 g, Rfl: 0   traZODone  (DESYREL ) 50 MG tablet, Take 1 tablet (50 mg  total) by mouth at bedtime as needed for sleep., Disp: 90 tablet, Rfl: 0  Current Facility-Administered Medications:    clotrimazole  (LOTRIMIN ) 1 % cream, , Topical, BID,   Review of Systems:    Neuro: Lower extremity weakness  Physical Exam:  Vitals:   06/05/24 1453  BP: 127/78  Pulse: 75  Temp: 98.2 F (36.8 C)  TempSrc: Oral  SpO2: 97%  Weight: 203 lb 9.6 oz (92.4 kg)  Height: 5' 1 (1.549 m)    General: Patient is sitting comfortably in the room  Eyes: Pupils  equal and reactive to light Head: Normocephalic, atraumatic  Cardio: Regular rate and rhythm, no murmurs, rubs or gallops Pulmonary: Clear to ausculation bilaterally with no rales, rhonchi, and crackles  Neuro: No sensation deficits appreciated,  2+ patellar reflex, Achilles reflex, and Babinski reflex negative. Normal Gait  Back: Full range of motion with pain Extremities:  3/5 strength noted to hip flexion on right lower extremity, 4/5 noted on hip flexion to left lower extremity.  4/5 strength noted to knee extension and flexion on bilateral lower extremities. Bilateral upper extremities with 5/5 strength noted to elbow flexion, elbow extension, shoulder abduction, and shoulder adduction.    Assessment & Plan:   Essential hypertension Blood pressure on exam today 127/78.  This is at goal.  Continue losartan -HCTZ 100-25 mg daily.  Plan: - Continue losartan -HCTZ 100-25 mg daily  Falls, subsequent encounter Patient was seen by me for bilateral hip pain and falls back in May 2025.  She returns again today with concerns of falls again.  This time this is not pain related.  She has been to physical therapy, but reports she has still been having some falls.  Her last fall was last month.  She states she missed a step and fell on the curb.  She states that she has felt like her legs have been giving out.  She denies any blackout episodes.  She denies any vision changes, tremors, palpitations, chest pain, sweating,  headaches, or presyncopal episodes.  She denies any urinary incontinence, tongue biting, or hypoglycemic episodes.  On my exam, patient does have right lower extremity weakness. When compared with the left, right has more weakness.  She has had MRI findings previously showing significant left spinal stenosis, but not a lot of right.  I wonder if her symptoms have progressed.  Will evaluate with MRI.  Did also consider polymyalgia rheumatica, polymyositis, but given equal strength to upper extremities, do not think this is related.  Also patient has more weakness to right lower extremity compared to left where his autoimmune disease would likely be bilateral.  Plan: - Follow-up MRI lumbar without contrast - DME scooter   Insomnia No acute concerns.  Plan: - Refilled trazodone  dose  Osteoporosis No acute concerns.  Patient doing well with this medication.  Plan: - Refill alendronate  10 mg daily  Uncontrolled type 2 diabetes mellitus on insulin  [facility]) Patient has a past medical history of diabetes. Her current medications included metformin  1000 mg BID, humalog  5 units TID with meals, tresiba  15 units daily, Mounjaro . A1c 3 months ago at 8.1. Needs A1c at next visit.   Plan: - Change Mounjaro  to ozempic  as patient is not able to tolerate Ozempic  - Continue tresiba  15 units  - Continue Humalog  5 units TID - Continue metformin  1000 mg BID  - A1c check at next visit    This patient was seen and evaluated by a medical student. I have independently evaluated and assessed the patient and agree with the finding displated by the medical student. Together, we have formulated a plan agreed on by our attending physician.   Patient discussed with Dr. Trudy Libby Blanch, DO Internal Medicine Resident PGY-3

## 2024-06-05 NOTE — Patient Instructions (Addendum)
 Thank you, Kara Kelley for allowing us  to provide your care today. Today we discussed your falls.    Ordered:  MRI  We have sent your medications to the pharmacy and an order for your scooter.   My Chart Access: [email or web address]  Please follow-up in: 3 months    We look forward to seeing you next time. Please call our clinic at [phone] if you have any questions or concerns. The best time to call is Monday-Friday from 9am-4pm, but there is someone available 24/7. If after hours or the weekend, call the main hospital number and ask for the Internal Medicine Resident On-Call. If you need medication refills, please notify your pharmacy one week in advance and they will send us  a request.   Thank you for letting us  take part in your care. Wishing you the best!

## 2024-06-05 NOTE — Progress Notes (Deleted)
 This is a Psychologist, occupational Note.  The care of the patient was discussed with Dr. Tobie and the assessment and plan was formulated with their assistance.  Please see their note for official documentation of the patient encounter.   Subjective:   Patient ID: Kara Kelley female   DOB: 08-24-1956 68 y.o.   MRN: [id number]  HPI: [name] is a 68 y.o. with PMH BV, COPD, T2DM, depression, GERD who presents for an acute visit regarding falls.   Clemens last month Lack of stability  Hard to lift legs Falling for years Legs giving out  No dizziess Hypoglycemia? Tremors x Sweating x Palp x Cp x Headache x Vision changes x Prescynx Loss of consciousnessx Headstrike x Urinary incontinence x Fecal incontinencex Tongue biting x Hypoglycemic resultsx   Diet low in vegetables/leafy greens y Weakness in legs y Sometimes has numbness and tingling Shaking xsometimes Anxiety xsometimes Sob yes; 3 years  COPD  Glucose/dm2: ? Fasting gluocse? Around 100,  Last A1c 8.08 Feb 2024  Hydrocodone  aceit for back at pain mangagement/neurosurgery and spine since 02/2023 4 times a day  Plan:  Sxs and exa  Med rec:  Humalog  5 TID Trieseba once a day 20 units Mounjaro  2.5mg  injection once a week>>ozempic  0.25mg   Metformin  500mg  BID Omeprazole  Burproprion>depression Duloxetine  Hydrocodone /aceitominophen>back, for a couple of months, pain management Alendronate  Atorvastatin  Cyclobenzaprine  x  Duloxetine  Busprion Jardiance  10 Apripriazole Meloxicam  Losartan  Trazadone 150 mg a night atarax   Jardiance  10  Atarax  25 - once a day, sometimes more Insulin  degludec 15 units daily Insulin  lispro 5 units daily Metformin  500mg  BID Trazadone 50mg  at bedtime - every night at bedtime (3 tablets every night150mg  at night) Wellbutrin  300mg  BID Duloxetine  60mg  daily Linzess  Losartan -hydrochlorothiazide 100-25mg  Atorvastatin  Alendronate   No eating many vegetables Meats oncea  day Has sweets Has a lot of fatigue throughout day Has pain in knees Tobacco: 2 pack a day 15-60 2 packs a day Alcohol: no Drugs: no; 5 years ago; crack, weed, heroine  For the details of today's visit, please refer to the assessment and plan.     Past Medical History:  Diagnosis Date   Anemia    Anxiety    Arthritis    Asthma    Bacterial vaginosis 05/16/2022   Bunion    Callus    Candida vaginitis 09/16/2022   Cholelithiasis without obstruction 04/11/2022   Chronic pain    Cocaine abuse (HCC)    COPD (chronic obstructive pulmonary disease) (HCC)    Corns and callosities    Degenerative joint disease    Depression    Diabetes mellitus    Endometrial polyp    ETOH abuse    Gall stones    Gallbladder calculus with acute cholecystitis and no obstruction 04/11/2022   GERD (gastroesophageal reflux disease)    Headache    history of Migraines   Hepatitis C    Hep C   History of adenomatous polyp of colon 04/11/2022   History of endometrial ablation 02/06/2013   Hyperlipidemia    Hypertension    Insomnia    Spondylolisthesis of lumbar region    Substance abuse (HCC)    alcoholism   Tuberculosis 1985   Wears dentures    Wears glasses    Current Outpatient Medications  Medication Sig Dispense Refill   Accu-Chek FastClix Lancets MISC Use as directed to check blood sugars 2 times per day dx: e11.65 102 each prn   Accu-Chek Softclix Lancets lancets Use as instructed  to check blood sugars twice daily E11.69 100 each 2   acetaminophen  (TYLENOL ) 325 MG tablet Take 1-2 tablets (325-650 mg total) by mouth every 4 (four) hours as needed for mild pain.     albuterol  (VENTOLIN  HFA) 108 (90 Base) MCG/ACT inhaler Inhale 1-2 puffs into the lungs every 6 (six) hours as needed for wheezing or shortness of breath. 18 g 2   alendronate  (FOSAMAX ) 10 MG tablet Take 1 tablet (10 mg total) by mouth daily before breakfast. Take with a full glass of water on an empty stomach. 30 tablet 11    atorvastatin  (LIPITOR) 10 MG tablet Take 1 tablet (10 mg total) by mouth daily. 30 tablet 11   benzonatate  (TESSALON  PERLES) 100 MG capsule Take 1 capsule (100 mg total) by mouth 3 (three) times daily as needed for cough. 40 capsule 0   Blood Glucose Monitoring Suppl (ONETOUCH VERIO REFLECT) w/Device KIT Use to check blood sugar up to 3 times a day 1 kit 0   buPROPion  (WELLBUTRIN  XL) 300 MG 24 hr tablet Take 1 tablet (300 mg total) by mouth in the morning and at bedtime. 180 tablet 1   Continuous Glucose Receiver (FREESTYLE LIBRE 3 READER) DEVI Use to check your blood sugar continuously. 1 each 0   Continuous Glucose Sensor (FREESTYLE LIBRE 3 PLUS SENSOR) MISC Change sensor every 15 days. Use to check your blood sugar continuously. 2 each 11   diclofenac  Sodium (VOLTAREN ) 1 % GEL Apply 4 g topically 4 (four) times daily. 100 g 2   docusate sodium  (COLACE) 100 MG capsule Take 1 capsule (100 mg total) by mouth every 12 (twelve) hours. 60 capsule 0   DULoxetine  (CYMBALTA ) 60 MG capsule Take 1 capsule (60 mg total) by mouth daily. 90 capsule 3   empagliflozin  (JARDIANCE ) 10 MG TABS tablet Take 1 tablet (10 mg total) by mouth daily. 30 tablet 11   fluconazole  (DIFLUCAN ) 150 MG tablet Take 1 tablet (150 mg total) by mouth daily. 2 tablet 0   fluticasone  (FLONASE ) 50 MCG/ACT nasal spray Place 1 spray into both nostrils daily. 16 g 2   fluticasone -salmeterol (ADVAIR) 250-50 MCG/ACT AEPB INHALE 1 PUFF BY MOUTH INTO LUNGS TWICE DAILY IN THE MORNING AND AT BEDTIME 60 each 2   glucose blood (ONETOUCH ULTRA) test strip TEST AS DIRECTED UPTO 3 TIMES DAILY 100 strip 1   hydrOXYzine  (ATARAX ) 25 MG tablet Take 1 tablet (25 mg total) by mouth every 8 (eight) hours as needed for anxiety. 30 tablet 1   insulin  degludec (TRESIBA  FLEXTOUCH) 100 UNIT/ML FlexTouch Pen Inject 15 Units into the skin daily. 27 mL 1   insulin  lispro (HUMALOG  KWIKPEN) 100 UNIT/ML KwikPen ADMINISTER 5 UNITS UNDER THE SKIN THREE TIMES DAILY WITH  MEALS 3 mL 3   Insulin  Pen Needle (PEN NEEDLES) 32G X 4 MM MISC Use new pen needle for each insulin  injection up to 4 times a day. 200 each 5   Insulin  Syringe-Needle U-100 (INSULIN  SYRINGE .3CC/30GX5/16) 30G X 5/16 0.3 ML MISC Use to inject Levemir  twice daily. 100 each 2   Iron , Ferrous Sulfate , 325 (65 Fe) MG TABS Take 325 mg by mouth daily. 30 tablet 2   Lancet Device MISC 1 each by Does not apply route in the morning, at noon, and at bedtime. May substitute to any manufacturer covered by patient's insurance. 100 each 0   lidocaine  (LIDODERM ) 5 % Place 1 patch onto the skin daily. Remove & Discard patch within 12 hours or as  directed by MD 30 patch 0   LINZESS  145 MCG CAPS capsule Take 145 mcg by mouth daily before breakfast.      losartan -hydrochlorothiazide (HYZAAR) 100-25 MG tablet Take 1 tablet by mouth daily. 90 tablet 3   metFORMIN  (GLUCOPHAGE -XR) 500 MG 24 hr tablet Take 2 tablets (1,000 mg total) by mouth 2 (two) times daily with a meal. 120 tablet 11   mineral oil-hydrophilic petrolatum  (AQUAPHOR) ointment Apply topically as needed for dry skin. 420 g 0   Multiple Vitamins-Minerals (MULTIVITAMIN WITH MINERALS) tablet Take 1 tablet by mouth daily. Alive     omeprazole  (PRILOSEC) 40 MG capsule TAKE 1 CAPSULE(40 MG) BY MOUTH DAILY BEFORE BREAKFAST 90 capsule 3   polyethylene glycol powder (GLYCOLAX /MIRALAX ) 17 GM/SCOOP powder Take 17 g by mouth 2 (two) times daily. Until daily soft stools OTC 238 g 0   tirzepatide  (MOUNJARO ) 2.5 MG/0.5ML Pen Inject 2.5 mg into the skin once a week. 0.5 mL 3   traZODone  (DESYREL ) 50 MG tablet Take 1 tablet (50 mg total) by mouth at bedtime as needed for sleep. 90 tablet 0   Current Facility-Administered Medications  Medication Dose Route Frequency Provider Last Rate Last Admin   clotrimazole  (LOTRIMIN ) 1 % cream   Topical BID        Family History  Problem Relation Age of Onset   Heart disease Mother    Diabetes Other        mat great aunt    Cirrhosis Other        mat great aunt   Cirrhosis Other        mat great uncles x 2   Colon cancer Neg Hx    Breast cancer Neg Hx    Social History   Socioeconomic History   Marital status: Single    Spouse name: Not on file   Number of children: 3   Years of education: Not on file   Highest education level: Not on file  Occupational History   Occupation: disablily  Tobacco Use   Smoking status: Former    Current packs/day: 0.00    Average packs/day: 0.8 packs/day for 47.0 years (35.3 ttl pk-yrs)    Types: Cigarettes    Start date: 69    Quit date: 2020    Years since quitting: 5.6    Passive exposure: Past   Smokeless tobacco: Never  Vaping Use   Vaping status: Never Used  Substance and Sexual Activity   Alcohol use: No    Alcohol/week: 0.0 standard drinks of alcohol    Comment: in rehab   Drug use: No    Comment: in rehab   Sexual activity: Not Currently    Birth control/protection: Post-menopausal  Other Topics Concern   Not on file  Social History Narrative   Not on file   Social Drivers of Health   Financial Resource Strain: Low Risk  (04/20/2023)   Overall Financial Resource Strain (CARDIA)    Difficulty of Paying Living Expenses: Not hard at all  Food Insecurity: No Food Insecurity (04/20/2023)   Hunger Vital Sign    Worried About Running Out of Food in the Last Year: Never true    Ran Out of Food in the Last Year: Never true  Transportation Needs: No Transportation Needs (04/20/2023)   PRAPARE - Administrator, Civil Service (Medical): No    Lack of Transportation (Non-Medical): No  Physical Activity: Sufficiently Active (04/20/2023)   Exercise Vital Sign    Days of  Exercise per Week: 3 days    Minutes of Exercise per Session: 60 min  Stress: No Stress Concern Present (04/20/2023)   [employer] of Occupational Health - Occupational Stress Questionnaire    Feeling of Stress : Only a little  Social Connections: Moderately Integrated  (04/20/2023)   Social Connection and Isolation Panel    Frequency of Communication with Friends and Family: More than three times a week    Frequency of Social Gatherings with Friends and Family: More than three times a week    Attends Religious Services: More than 4 times per year    Active Member of [employer] or Organizations: Yes    Attends Banker Meetings: More than 4 times per year    Marital Status: Widowed   Review of Systems: {Review Of Systems:30496} Objective:  Physical Exam: There were no vitals filed for this visit.  Constitutional: NAD, appears comfortable HEENT: Atraumatic, normocephalic. PERRL, anicteric sclera.  Neck: Supple, trachea midline.  Cardiovascular: RRR, no murmurs, rubs, or gallops.  Pulmonary/Chest: CTAB, no wheezes, rales, or rhonchi. No chest wall abnormalities.  Abdominal: Soft, non tender, non distended. +BS.  GU: ***  Extremities: Warm and well perfused. Distal pulses intact. No edema.  Neurological: A&Ox3, CN II - XII grossly intact.  Skin: No rashes or erythema  Psychiatric: Normal mood and affect  Assessment & Plan:   No problem-specific Assessment & Plan notes found for this encounter.

## 2024-06-06 ENCOUNTER — Other Ambulatory Visit: Payer: Self-pay | Admitting: Student

## 2024-06-06 ENCOUNTER — Telehealth: Payer: Self-pay

## 2024-06-06 ENCOUNTER — Other Ambulatory Visit: Payer: Self-pay | Admitting: *Deleted

## 2024-06-06 ENCOUNTER — Telehealth: Payer: Self-pay | Admitting: *Deleted

## 2024-06-06 ENCOUNTER — Other Ambulatory Visit

## 2024-06-06 DIAGNOSIS — G4700 Insomnia, unspecified: Secondary | ICD-10-CM

## 2024-06-06 DIAGNOSIS — M818 Other osteoporosis without current pathological fracture: Secondary | ICD-10-CM

## 2024-06-06 DIAGNOSIS — E785 Hyperlipidemia, unspecified: Secondary | ICD-10-CM

## 2024-06-06 MED ORDER — SEMAGLUTIDE(0.25 OR 0.5MG/DOS) 2 MG/3ML ~~LOC~~ SOPN: 0.2500 mg | PEN_INJECTOR | SUBCUTANEOUS | 3 refills | Status: DC

## 2024-06-06 MED ORDER — TRAZODONE HCL 50 MG PO TABS: 50.0000 mg | ORAL_TABLET | Freq: Every evening | ORAL | 0 refills | Status: DC | PRN

## 2024-06-06 MED ORDER — ATORVASTATIN CALCIUM 10 MG PO TABS: 10.0000 mg | ORAL_TABLET | Freq: Every day | ORAL | 11 refills | Status: AC

## 2024-06-06 MED ORDER — DULOXETINE HCL 60 MG PO CPEP: 60.0000 mg | ORAL_CAPSULE | Freq: Every day | ORAL | 3 refills | Status: AC

## 2024-06-06 MED ORDER — BUPROPION HCL ER (XL) 300 MG PO TB24: 300.0000 mg | ORAL_TABLET | Freq: Two times a day (BID) | ORAL | 1 refills | Status: DC

## 2024-06-06 MED ORDER — ALENDRONATE SODIUM 10 MG PO TABS: 10.0000 mg | ORAL_TABLET | Freq: Every day | ORAL | 11 refills | Status: AC

## 2024-06-06 NOTE — Progress Notes (Deleted)
 06/06/2024 Name: Kara Kelley MRN: [id number] DOB: 1956-02-16  No chief complaint on file.   Kara Kelley is a 68 y.o. year old female who presented for a telephone visit.   They were referred to the pharmacist by their PCP for assistance in managing diabetes and polypharmacy. PMH includes HTN, COPD, hepatitis C, GERD, T2DM with retinopathy, osteoporosis, CKD3, HLD.    Subjective: Patient was last seen by PCP, Libby Blanch, DO, on 06/05/24. At last visit, BP was controlled on losartan -hydrochlorothiazide. Her last A1C was 8.1% in May, increased from 7.4%. Previously, she was transitioned from Ozempic  1 mg to Mounjaro  2.5 mg weekly, but at her most recent visit she reported not tolerating Mounjaro  and was instructed to restart Ozempic  at 0.25 mg weekly. She was instructed to continue Tresiba , Humalog , and metformin  as previously prescribed. She was outreached by pharmacy via telephone on 04/19/24, but outreach was unsuccessful.   Today, patient reports   Care Team: Primary Care Provider: Blanch Libby, DO ; Next Scheduled Visit: needs to be scheduled ~Nov-Dec 2025   Medication Access/Adherence  Current Pharmacy:  [facility] - [place], [place] - [address] [place] [address] Phone: [phone] Fax: [phone]  [id number] - [place], [place] - [address] AT [facility] [address] [place] [address] Phone: [phone] Fax: [phone]   Patient reports affordability concerns with their medications: {YES/NO:21197} Patient reports access/transportation concerns to their pharmacy: {YES/NO:21197} Patient reports adherence concerns with their medications:  {YES/NO:21197} ***  *** Patient denies adherence with medications, reports missing *** medications *** times per week, on average.   Diabetes:  Current medications: Ozempic  0.25 mg weekly (has not started, Mounjaro  last dispensed  on 05/28/24 for 28 ds), Tresiba  15 units daily, Humalog  5 units TID with meals, metformin  XR 1000 mg BID Medications tried in the past: Mounjaro  - GI AE***,   Current glucose readings: *** Using *** meter; testing *** times daily  Date of Download: *** % Time CGM is active: ***% Average Glucose: *** mg/dL Glucose Management Indicator: ***  Glucose Variability: *** (goal <36%) Time in Goal:  - Time in range 70-180: ***% - Time above range: ***% - Time below range: ***% Observed patterns:  Patient {Actions; denies-reports:120008} hypoglycemic s/sx including ***dizziness, shakiness, sweating. Patient {Actions; denies-reports:120008} hyperglycemic symptoms including ***polyuria, polydipsia, polyphagia, nocturia, neuropathy, blurred vision.  Current meal patterns:  - Breakfast: *** - Lunch *** - Supper *** - Snacks *** - Drinks ***  Current physical activity: ***  Current medication access support: ***  Hypertension:  Current medications: *** Medications previously tried:   Patient {HAS/DOES NOT YJCZ:65250} a validated, automated, upper arm home BP cuff Current blood pressure readings readings: ***  Patient {Actions; denies-reports:120008} hypotensive s/sx including ***dizziness, lightheadedness.  Patient {Actions; denies-reports:120008} hypertensive symptoms including ***headache, chest pain, shortness of breath  Current meal patterns: ***  Current physical activity: ***   Hyperlipidemia/ASCVD Risk Reduction  Current lipid lowering medications:  Medications tried in the past:   Antiplatelet regimen:   ASCVD History:  Family History:  Risk Factors:   Current physical activity: ***  Current medication access support: ***  Clinical ASCVD: No  The 10-year ASCVD risk score (Arnett DK, et al., 2019) is: 16%   Values used to calculate the score:     Age: 81 years     Clincally relevant sex: Female     Is Non-Hispanic African  American: Yes     Diabetic: Yes      Tobacco smoker: No     Systolic Blood Pressure: 127 mmHg     Is BP treated: Yes     HDL Cholesterol: 70 mg/dL     Total Cholesterol: 149 mg/dL    Objective:  BP Readings from Last 3 Encounters:  06/05/24 127/78  04/18/24 108/74  04/04/24 131/68    Lab Results  Component Value Date   HGBA1C 8.1 (A) 02/27/2024   HGBA1C 7.4 (A) 11/01/2023   HGBA1C 8.2 (A) 06/08/2023       Latest Ref Rng & Units 04/04/2024   11:06 AM 03/24/2024   12:09 AM 12/21/2023    3:12 PM  BMP  Glucose 70 - 99 mg/dL 884  778  71   BUN 8 - 27 mg/dL 19  18  13    Creatinine 0.57 - 1.00 mg/dL 8.66  8.62  8.68   BUN/Creat Ratio 12 - 28 14   10    Sodium 134 - 144 mmol/L 143  133  140   Potassium 3.5 - 5.2 mmol/L 3.5  3.2  4.3   Chloride 96 - 106 mmol/L 105  101  106   CO2 20 - 29 mmol/L 18  20  18    Calcium  8.7 - 10.3 mg/dL 9.0  9.3  8.7     Lab Results  Component Value Date   CHOL 149 12/09/2022   HDL 70 12/09/2022   LDLCALC 66 12/09/2022   TRIG 61 12/09/2022   CHOLHDL 2.1 12/09/2022    Medications Reviewed Today   Medications were not reviewed in this encounter       Assessment/Plan:   Metformin  right at renal cutoff for TDD of 1000 > consider dose reduction after next BMP if still < 45.  Bring in person for A1C ? Or wait til she has been on Ozempic  longer?  Diabetes: - Currently {CHL Controlled/[phone]} with most recent A1C of *** {Desc; above/below:16086} goal {CCM A1c HNJOD:78908453}. Medication adherence appears ***. Control is suboptimal due to ***.   - Last UACR *** - Patient denies personal or family history of multiple endocrine neoplasia type 2, medullary thyroid  cancer; personal history of pancreatitis or gallbladder disease.*** - Reviewed long term cardiovascular and renal outcomes of uncontrolled blood sugar - Reviewed goal A1c, goal fasting, and goal 2 hour post prandial glucose - Reviewed hypoglycemia management plan and the rule of 15*** - Reviewed dietary  modifications including *** utilizing the healthy plate method, limiting portion size of carbohydrate foods, increasing intake of protein and non-starchy vegetables. Counseled patient to stay hydrated with water throughout the day. - Reviewed lifestyle modifications including: aiming for 150 minutes of moderate intensity exercise every week. *** - Recommend to ***  - Recommend to check glucose twice daily: fasting and 2-hr PPG ***. Counseled patient to bring glucometer or BG log to every appointment. - Next A1C due ***    Meets financial criteria for *** patient assistance program through ***. Will collaborate with provider, CPhT, and patient to pursue assistance.     Hypertension: - Currently {CHL Controlled/[phone]} with *** BP consistently {Desc; above/below:16086} goal {BP Goal:27557}. Patient {ACTION; IS/IS WNU:78978602} having s/sx of hypo- or hyper-tension. Medication adherence appears {Desc; appropriate/inappropriate:30686}. Control is suboptimal due to ***.  - Reviewed long term cardiovascular and renal outcomes of uncontrolled blood pressure - Reviewed appropriate blood pressure monitoring technique and reviewed goal blood pressure. Recommended to check home blood pressure and heart rate *** once  daily and keep a log to bring to upcoming appointments - Recommend to ***     Hyperlipidemia/ASCVD Risk Reduction: - Currently {CHL Controlled/Uncontrolled:8634980655} with most recent LDL-C of *** {Desc; above/below:16086} goal {LDL Goals:32982} given ***. *** intensity statin indicated. - Reviewed long term complications of uncontrolled cholesterol - Reviewed lifestyle recommendations to lower LDL-C including regular physical activity, 5-10% weight loss, eating a diet low in saturated fat, and increasing intake of fiber to at least 25 g per day. - Reviewed lifestyle recommendations to lower TG including following a low-carb diet, restricting alcohol intake, and increasing  physical activity and exercise - Recommend to ***     Written patient instructions provided. Patient verbalized understanding of treatment plan.   Follow Up Plan: *** Pharmacist *** PCP clinic visit needs to be scheduled  Lorain Baseman, PharmD [facility] [phone]

## 2024-06-06 NOTE — Telephone Encounter (Signed)
 Copied from CRM [id number]. Topic: Clinical - Prescription Issue >> Jun 06, 2024 10:21 AM Mercer PEDLAR wrote: Reason for CRM:  Patient is requesting prescriptions below to be sent to [facility] instead of [facility].  alendronate  (FOSAMAX ) 10 MG tablet atorvastatin  (LIPITOR) 10 MG tablet buPROPion  (WELLBUTRIN  XL) 300 MG 24 hr tablet Semaglutide ,0.25 or 0.5MG /DOS, 2 MG/3ML SOPN   traZODone  (DESYREL ) 50 MG tablet  [id number] - [place], [place] - [address] AT [facility] OF [address] [place] [address] Phone: [phone] Fax: [phone]

## 2024-06-06 NOTE — Telephone Encounter (Signed)
 Reminder call to patient regarding her MR lumbar spine with contrast at [facility] on 06/14/2024 @ 2:00 pm/ patient  is to call [phone]. Appointment mailed to patient.

## 2024-06-06 NOTE — Progress Notes (Signed)
 Pt stated she called the pharmacy and was told a new rx is needed.

## 2024-06-06 NOTE — Telephone Encounter (Signed)
 Copied from CRM #[id number]. Topic: Clinical - Medication Question >> Jun 06, 2024  1:09 PM Diannia H wrote: Reason for CRM: Provider took patient off anxiety medicine yesterday and she was suppose to have another anxiety medicine bu she is not sure what it is. Could you assist because she is having the shakes and she can not be still and needs her meds. Patients callback is [phone].

## 2024-06-06 NOTE — Telephone Encounter (Signed)
 Pt stated the doctor took her off Hydroxyzine  b/c she was already on med for anxiety. Informed pt Duloxetine  was ordered 08/2023; stated she did not get it- instructed her to call the pharmacy.

## 2024-06-06 NOTE — Progress Notes (Signed)
 Attempted to contact patient for scheduled appointment for medication management. Left HIPAA compliant message for patient to return my call at their convenience.   Lorain Baseman, PharmD [facility] [phone]

## 2024-06-06 NOTE — Addendum Note (Signed)
 Addended by: CAMMIE GAETANA DEL on: 06/06/2024 12:15 PM   Modules accepted: Orders

## 2024-06-13 DIAGNOSIS — M545 Low back pain, unspecified: Secondary | ICD-10-CM | POA: Diagnosis not present

## 2024-06-13 DIAGNOSIS — E1122 Type 2 diabetes mellitus with diabetic chronic kidney disease: Secondary | ICD-10-CM | POA: Diagnosis not present

## 2024-06-13 DIAGNOSIS — Z794 Long term (current) use of insulin: Secondary | ICD-10-CM | POA: Diagnosis not present

## 2024-06-13 DIAGNOSIS — Z79899 Other long term (current) drug therapy: Secondary | ICD-10-CM | POA: Diagnosis not present

## 2024-06-13 DIAGNOSIS — Z9181 History of falling: Secondary | ICD-10-CM | POA: Diagnosis not present

## 2024-06-13 DIAGNOSIS — T402X5A Adverse effect of other opioids, initial encounter: Secondary | ICD-10-CM | POA: Diagnosis not present

## 2024-06-13 DIAGNOSIS — G8929 Other chronic pain: Secondary | ICD-10-CM | POA: Diagnosis not present

## 2024-06-13 DIAGNOSIS — E559 Vitamin D deficiency, unspecified: Secondary | ICD-10-CM | POA: Diagnosis not present

## 2024-06-13 DIAGNOSIS — K5903 Drug induced constipation: Secondary | ICD-10-CM | POA: Diagnosis not present

## 2024-06-13 DIAGNOSIS — N1831 Chronic kidney disease, stage 3a: Secondary | ICD-10-CM | POA: Diagnosis not present

## 2024-06-14 ENCOUNTER — Ambulatory Visit (HOSPITAL_COMMUNITY)
Admission: RE | Admit: 2024-06-14 | Discharge: 2024-06-14 | Disposition: A | Discharge status: Home / Self Care | Source: Ambulatory Visit | Attending: Internal Medicine | Admitting: Internal Medicine

## 2024-06-14 DIAGNOSIS — M25561 Pain in right knee: Secondary | ICD-10-CM | POA: Diagnosis not present

## 2024-06-14 DIAGNOSIS — M48061 Spinal stenosis, lumbar region without neurogenic claudication: Secondary | ICD-10-CM

## 2024-06-14 DIAGNOSIS — M25562 Pain in left knee: Secondary | ICD-10-CM | POA: Diagnosis not present

## 2024-06-14 DIAGNOSIS — R262 Difficulty in walking, not elsewhere classified: Secondary | ICD-10-CM | POA: Diagnosis not present

## 2024-06-14 DIAGNOSIS — M170 Bilateral primary osteoarthritis of knee: Secondary | ICD-10-CM | POA: Diagnosis not present

## 2024-06-14 NOTE — Progress Notes (Signed)
 Internal Medicine Clinic Attending  Case discussed with the resident and student at the time of the visit.  I reviewed the student and resident's history and exam and pertinent patient test results.  I agree with the assessment, diagnosis, and plan of care documented in the student's note.

## 2024-06-17 ENCOUNTER — Ambulatory Visit: Payer: Self-pay | Admitting: Student

## 2024-06-17 DIAGNOSIS — W19XXXD Unspecified fall, subsequent encounter: Secondary | ICD-10-CM

## 2024-06-18 NOTE — Telephone Encounter (Signed)
-----   Message from Mliss Arlean Pouch sent at 06/18/2024  9:39 AM EDT ----- Yes you're correct. As long as there are no UE symptoms at all, should not be cervical.  She'll probably need a referral to neuro for her gait abnormality regardless.   ----- Message ----- From: Tobie Gaines, DO Sent: 06/18/2024   8:17 AM EDT To: Mliss Arlean Pouch, MD  That is a good thought, but it is just weakness at the legs, nothing in the arms, would be an odd presentation just for the legs if it were cervical right?  ----- Message ----- From: Pouch Mliss Arlean, MD Sent: 06/17/2024   4:03 PM EDT To: Gaines Tobie, DO  Thanks Yulisa Chirico, I don't recall the details of her case but cervical myelopathy might need to be considered.   ----- Message ----- From: Tobie Gaines, DO Sent: 06/17/2024   3:52 PM EDT To: Mliss Arlean Pouch, MD  Patient notified, no changes to care at this time, No obvious findings on why patient has these falls. ----- Message ----- From: Interface, Rad Results In Sent: 06/17/2024   3:40 PM EDT To: Gaines Tobie, DO

## 2024-06-19 ENCOUNTER — Encounter: Payer: Self-pay | Admitting: Student

## 2024-06-19 ENCOUNTER — Ambulatory Visit: Admitting: Student

## 2024-06-19 ENCOUNTER — Telehealth: Payer: Self-pay

## 2024-06-19 VITALS — BP 115/75 | HR 77 | Temp 98.1°F | Ht 61.0 in | Wt 202.4 lb

## 2024-06-19 DIAGNOSIS — Z7984 Long term (current) use of oral hypoglycemic drugs: Secondary | ICD-10-CM

## 2024-06-19 DIAGNOSIS — E1122 Type 2 diabetes mellitus with diabetic chronic kidney disease: Secondary | ICD-10-CM

## 2024-06-19 DIAGNOSIS — I10 Essential (primary) hypertension: Secondary | ICD-10-CM

## 2024-06-19 DIAGNOSIS — Z79899 Other long term (current) drug therapy: Secondary | ICD-10-CM | POA: Diagnosis not present

## 2024-06-19 DIAGNOSIS — E785 Hyperlipidemia, unspecified: Secondary | ICD-10-CM | POA: Diagnosis not present

## 2024-06-19 DIAGNOSIS — I129 Hypertensive chronic kidney disease with stage 1 through stage 4 chronic kidney disease, or unspecified chronic kidney disease: Secondary | ICD-10-CM

## 2024-06-19 DIAGNOSIS — Z1211 Encounter for screening for malignant neoplasm of colon: Secondary | ICD-10-CM

## 2024-06-19 DIAGNOSIS — W19XXXD Unspecified fall, subsequent encounter: Secondary | ICD-10-CM

## 2024-06-19 DIAGNOSIS — Z122 Encounter for screening for malignant neoplasm of respiratory organs: Secondary | ICD-10-CM

## 2024-06-19 DIAGNOSIS — Z7985 Long-term (current) use of injectable non-insulin antidiabetic drugs: Secondary | ICD-10-CM | POA: Diagnosis not present

## 2024-06-19 DIAGNOSIS — E1165 Type 2 diabetes mellitus with hyperglycemia: Secondary | ICD-10-CM

## 2024-06-19 DIAGNOSIS — Z9181 History of falling: Secondary | ICD-10-CM | POA: Diagnosis not present

## 2024-06-19 DIAGNOSIS — Z794 Long term (current) use of insulin: Secondary | ICD-10-CM | POA: Diagnosis not present

## 2024-06-19 DIAGNOSIS — N1832 Chronic kidney disease, stage 3b: Secondary | ICD-10-CM

## 2024-06-19 LAB — POCT GLYCOSYLATED HEMOGLOBIN (HGB A1C): HbA1c, POC (controlled diabetic range): 8.6 % — AB (ref 0.0–7.0)

## 2024-06-19 LAB — GLUCOSE, CAPILLARY: Glucose-Capillary: 109 mg/dL — ABNORMAL HIGH (ref 70–99)

## 2024-06-19 MED ORDER — SEMAGLUTIDE(0.25 OR 0.5MG/DOS) 2 MG/3ML ~~LOC~~ SOPN: 0.5000 mg | PEN_INJECTOR | SUBCUTANEOUS | 0 refills | Status: DC

## 2024-06-19 MED ORDER — SEMAGLUTIDE (1 MG/DOSE) 4 MG/3ML ~~LOC~~ SOPN: 1.0000 mg | PEN_INJECTOR | SUBCUTANEOUS | 0 refills | Status: DC

## 2024-06-19 MED ORDER — OZEMPIC (2 MG/DOSE) 8 MG/3ML ~~LOC~~ SOPN: 2.0000 mg | PEN_INJECTOR | SUBCUTANEOUS | 3 refills | Status: DC

## 2024-06-19 NOTE — Assessment & Plan Note (Signed)
 Lipid panel today.  Intermittent adherence with her atorvastatin , only 2 dispenses this year. -Will adjust therapy as indicated based on lipid

## 2024-06-19 NOTE — Assessment & Plan Note (Addendum)
 A1c 8.6 today.  This is a trend of increased over this past year.  She is on insulin .  Recently switched from Mounjaro  to Ozempic  as she was unable to tolerate the Mounjaro .  Pursue titration. - Tresiba  15 units and Humalog  5 units 3 times daily, no recent hypoglycemia - Continue metformin  1000 twice daily - Continue Jardiance  10 daily - Continue Ozempic  titration.  4 weeks of 0.25 g weekly, followed by 4 weeks of 0.5, followed by 4 weeks of 1, followed by 4 weeks of 2mg  weekly -ACR today -Return for A1c in 3 months

## 2024-06-19 NOTE — Progress Notes (Signed)
   CC: Office visit  HPI:  [name] is a 68 y.o. female with a PMH stated below who presents today for evaluation. She has no acute complaints.  Please see problem based assessment and plan for additional details.  Past Medical History:  Diagnosis Date   Anemia    Anxiety    Arthritis    Asthma    Bacterial vaginosis 05/16/2022   Bunion    Callus    Candida vaginitis 09/16/2022   Cholelithiasis without obstruction 04/11/2022   Chronic pain    Cocaine abuse (HCC)    COPD (chronic obstructive pulmonary disease) (HCC)    Corns and callosities    Degenerative joint disease    Depression    Diabetes mellitus    Endometrial polyp    ETOH abuse    Gall stones    Gallbladder calculus with acute cholecystitis and no obstruction 04/11/2022   GERD (gastroesophageal reflux disease)    Headache    history of Migraines   Hepatitis C    Hep C   History of adenomatous polyp of colon 04/11/2022   History of endometrial ablation 02/06/2013   Hyperlipidemia    Hypertension    Insomnia    Spondylolisthesis of lumbar region    Substance abuse (HCC)    alcoholism   Tuberculosis 1985   Wears dentures    Wears glasses    Review of Systems: ROS negative except for what is noted on the assessment and plan.  Vitals:   06/19/24 1316  BP: 115/75  Pulse: 77  Temp: 98.1 F (36.7 C)  TempSrc: Oral  SpO2: 91%  Weight: 202 lb 6.4 oz (91.8 kg)  Height: 5' 1 (1.549 m)   Physical Exam: Constitutional: well-appearing woman in no acute distress. Gait is slow but regular and not wide or shuffling. Cardiovascular: regular rate and rhythm, no m/r/g Pulmonary/Chest: normal work of breathing on room air, lungs clear to auscultation bilaterally MSK: normal bulk and tone Skin: warm and dry Psych: normal mood and behavior  Assessment & Plan:   Patient discussed with Dr. Trudy  Uncontrolled type 2 diabetes mellitus on insulin  (HCC) A1c 8.6 today.  This is a trend of  increased over this past year.  She is on insulin .  Recently switched from Mounjaro  to Ozempic  as she was unable to tolerate the Mounjaro .  Pursue titration. - Tresiba  15 units and Humalog  5 units 3 times daily, no recent hypoglycemia - Continue metformin  1000 twice daily - Continue Jardiance  10 daily - Continue Ozempic  titration.  4 weeks of 0.25 g weekly, followed by 4 weeks of 0.5, followed by 4 weeks of 1, followed by 4 weeks of 2mg  weekly -ACR today -Return for A1c in 3 months  Hyperlipidemia Lipid panel today.  Intermittent adherence with her atorvastatin , only 2 dispenses this year. -Will adjust therapy as indicated based on lipid  Essential hypertension At goal today.  115/75. -Continue losartan -HCTZ 100-25 mg daily  RTC in 3 months for diabetes and general checkup.  Lonni Africa, D.O. [facility], PGY-2 Phone: [phone] Date 06/19/2024 Time 4:46 PM

## 2024-06-19 NOTE — Telephone Encounter (Signed)
 Prior Authorization for patient (Ozempic  (2 MG/DOSE) 8MG /3ML pen-injectors) came through on cover my meds was submitted with last office notes and labs awaiting approval or denial.  XZB:[id number]

## 2024-06-19 NOTE — Assessment & Plan Note (Signed)
 Was recently referred to neurology to pursue workup for this issue.  No falls in the last couple months.  See office note from 8/27.

## 2024-06-19 NOTE — Patient Instructions (Signed)
 Let's start increasing your dose of ozempic .  Finish three more weeks of your 0.25mg  dose.  Then, your next refill will be for 0.5mg  per week. Do that for four weeks.  Then, your next refill will be for 1mg  per week. Do that for four weeks.  Then, your next refill will be for 2mg  per week. Do that indefinitely.  Please return for a checkup in 3 months.

## 2024-06-19 NOTE — Assessment & Plan Note (Signed)
 At goal today.  115/75. -Continue losartan -HCTZ 100-25 mg daily

## 2024-06-20 ENCOUNTER — Ambulatory Visit: Payer: Self-pay | Admitting: Student

## 2024-06-20 DIAGNOSIS — K5900 Constipation, unspecified: Secondary | ICD-10-CM | POA: Diagnosis not present

## 2024-06-20 LAB — MICROALBUMIN / CREATININE URINE RATIO
Creatinine, Urine: 46.5 mg/dL
Microalb/Creat Ratio: 23 mg/g{creat} (ref 0–29)
Microalbumin, Urine: 10.9 ug/mL

## 2024-06-20 LAB — LIPID PANEL
Chol/HDL Ratio: 3.2 ratio (ref 0.0–4.4)
Cholesterol, Total: 184 mg/dL (ref 100–199)
HDL: 58 mg/dL (ref >39)
LDL Chol Calc (NIH): 111 mg/dL — ABNORMAL HIGH (ref 0–99)
Triglycerides: 84 mg/dL (ref 0–149)
VLDL Cholesterol Cal: 15 mg/dL (ref 5–40)

## 2024-06-21 ENCOUNTER — Other Ambulatory Visit: Payer: Self-pay

## 2024-06-21 ENCOUNTER — Encounter (HOSPITAL_COMMUNITY): Payer: Self-pay

## 2024-06-21 ENCOUNTER — Emergency Department (HOSPITAL_COMMUNITY)
Admission: EM | Admit: 2024-06-21 | Discharge: 2024-06-21 | Disposition: A | Discharge status: Home / Self Care | Attending: Emergency Medicine | Admitting: Emergency Medicine

## 2024-06-21 ENCOUNTER — Other Ambulatory Visit: Payer: Self-pay | Admitting: Student

## 2024-06-21 ENCOUNTER — Emergency Department (HOSPITAL_COMMUNITY)

## 2024-06-21 DIAGNOSIS — M25559 Pain in unspecified hip: Secondary | ICD-10-CM | POA: Diagnosis not present

## 2024-06-21 DIAGNOSIS — Y92000 Kitchen of unspecified non-institutional (private) residence as  the place of occurrence of the external cause: Secondary | ICD-10-CM | POA: Diagnosis not present

## 2024-06-21 DIAGNOSIS — M25562 Pain in left knee: Secondary | ICD-10-CM | POA: Diagnosis not present

## 2024-06-21 DIAGNOSIS — Z981 Arthrodesis status: Secondary | ICD-10-CM | POA: Diagnosis not present

## 2024-06-21 DIAGNOSIS — W010XXA Fall on same level from slipping, tripping and stumbling without subsequent striking against object, initial encounter: Secondary | ICD-10-CM | POA: Insufficient documentation

## 2024-06-21 DIAGNOSIS — M25572 Pain in left ankle and joints of left foot: Secondary | ICD-10-CM | POA: Diagnosis present

## 2024-06-21 DIAGNOSIS — S8252XA Displaced fracture of medial malleolus of left tibia, initial encounter for closed fracture: Secondary | ICD-10-CM | POA: Diagnosis not present

## 2024-06-21 DIAGNOSIS — Z794 Long term (current) use of insulin: Secondary | ICD-10-CM | POA: Insufficient documentation

## 2024-06-21 DIAGNOSIS — S82842A Displaced bimalleolar fracture of left lower leg, initial encounter for closed fracture: Secondary | ICD-10-CM | POA: Diagnosis not present

## 2024-06-21 DIAGNOSIS — R079 Chest pain, unspecified: Secondary | ICD-10-CM | POA: Diagnosis not present

## 2024-06-21 DIAGNOSIS — E1122 Type 2 diabetes mellitus with diabetic chronic kidney disease: Secondary | ICD-10-CM

## 2024-06-21 DIAGNOSIS — S82432A Displaced oblique fracture of shaft of left fibula, initial encounter for closed fracture: Secondary | ICD-10-CM | POA: Diagnosis not present

## 2024-06-21 LAB — CBC WITH DIFFERENTIAL/PLATELET
Abs Immature Granulocytes: 0.03 K/uL (ref 0.00–0.07)
Basophils Absolute: 0 K/uL (ref 0.0–0.1)
Basophils Relative: 0 %
Eosinophils Absolute: 0 K/uL (ref 0.0–0.5)
Eosinophils Relative: 0 %
HCT: 38.4 % (ref 36.0–46.0)
Hemoglobin: 12.5 g/dL (ref 12.0–15.0)
Immature Granulocytes: 0 %
Lymphocytes Relative: 25 %
Lymphs Abs: 2.2 K/uL (ref 0.7–4.0)
MCH: 24.9 pg — ABNORMAL LOW (ref 26.0–34.0)
MCHC: 32.6 g/dL (ref 30.0–36.0)
MCV: 76.5 fL — ABNORMAL LOW (ref 80.0–100.0)
Monocytes Absolute: 0.4 K/uL (ref 0.1–1.0)
Monocytes Relative: 5 %
Neutro Abs: 6.1 K/uL (ref 1.7–7.7)
Neutrophils Relative %: 70 %
Platelets: 194 K/uL (ref 150–400)
RBC: 5.02 MIL/uL (ref 3.87–5.11)
RDW: 14.6 % (ref 11.5–15.5)
WBC: 8.7 K/uL (ref 4.0–10.5)
nRBC: 0 % (ref 0.0–0.2)

## 2024-06-21 LAB — I-STAT CHEM 8, ED
BUN: 11 mg/dL (ref 8–23)
Calcium, Ion: 1.08 mmol/L — ABNORMAL LOW (ref 1.15–1.40)
Chloride: 100 mmol/L (ref 98–111)
Creatinine, Ser: 1.4 mg/dL — ABNORMAL HIGH (ref 0.44–1.00)
Glucose, Bld: 136 mg/dL — ABNORMAL HIGH (ref 70–99)
HCT: 41 % (ref 36.0–46.0)
Hemoglobin: 13.9 g/dL (ref 12.0–15.0)
Potassium: 3.7 mmol/L (ref 3.5–5.1)
Sodium: 137 mmol/L (ref 135–145)
TCO2: 22 mmol/L (ref 22–32)

## 2024-06-21 LAB — COMPREHENSIVE METABOLIC PANEL WITH GFR
ALT: 18 U/L (ref 0–44)
AST: 21 U/L (ref 15–41)
Albumin: 4.3 g/dL (ref 3.5–5.0)
Alkaline Phosphatase: 84 U/L (ref 38–126)
Anion gap: 14 (ref 5–15)
BUN: 10 mg/dL (ref 8–23)
CO2: 22 mmol/L (ref 22–32)
Calcium: 9.3 mg/dL (ref 8.9–10.3)
Chloride: 98 mmol/L (ref 98–111)
Creatinine, Ser: 1.35 mg/dL — ABNORMAL HIGH (ref 0.44–1.00)
GFR, Estimated: 43 mL/min — ABNORMAL LOW (ref >60)
Glucose, Bld: 135 mg/dL — ABNORMAL HIGH (ref 70–99)
Potassium: 3.6 mmol/L (ref 3.5–5.1)
Sodium: 134 mmol/L — ABNORMAL LOW (ref 135–145)
Total Bilirubin: 0.5 mg/dL (ref 0.0–1.2)
Total Protein: 7.7 g/dL (ref 6.5–8.1)

## 2024-06-21 LAB — CK: Total CK: 96 U/L (ref 38–234)

## 2024-06-21 MED ORDER — OXYCODONE-ACETAMINOPHEN 5-325 MG PO TABS
2.0000 | ORAL_TABLET | Freq: Once | ORAL | Status: AC
  Administered 2024-06-21: 2 via ORAL
  Filled 2024-06-21: qty 2

## 2024-06-21 MED ORDER — OXYCODONE-ACETAMINOPHEN 5-325 MG PO TABS: 1.0000 | ORAL_TABLET | Freq: Four times a day (QID) | ORAL | 0 refills | Status: DC | PRN

## 2024-06-21 MED ORDER — ONDANSETRON HCL 4 MG/2ML IJ SOLN
4.0000 mg | Freq: Once | INTRAMUSCULAR | Status: AC
  Administered 2024-06-21: 4 mg via INTRAVENOUS
  Filled 2024-06-21: qty 2

## 2024-06-21 MED ORDER — MORPHINE SULFATE (PF) 4 MG/ML IV SOLN
4.0000 mg | Freq: Once | INTRAVENOUS | Status: AC
  Administered 2024-06-21: 4 mg via INTRAVENOUS
  Filled 2024-06-21: qty 1

## 2024-06-21 NOTE — Progress Notes (Signed)
 Orthopedic Tech Progress Note Patient Details:  Kara Kelley [birth year] [id number]  Ortho Devices Type of Ortho Device: Cotton web roll, Ace wrap, Post (short) splint, Stirrup splint Splint Material: Fiberglass Ortho Device/Splint Location: L ANKLE Ortho Device/Splint Interventions: Ordered, Application, Adjustment   Post Interventions Patient Tolerated: Well Instructions Provided: Care of device   Herrick Hartog L Adalida Garver 06/21/2024, 10:01 PM

## 2024-06-21 NOTE — ED Provider Triage Note (Signed)
 Emergency Medicine Provider Triage Evaluation Note  Kara Kelley , a 68 y.o. female  was evaluated in triage.  Pt complains of left ankle/lower leg pain.  Patient reports that she got up in the melanite to go to the bathroom and came back in her bed as have the ground.  She reports that she was stepping on a stool to get up but fell off the stool.  She denies hitting her head.  Denies any blood thinner use.  Has been having left lower leg/ankle pain since.  Was on the floor all night until called daughter to pick her up.  She denies any chest pain or shortness breath.  Denies any any other injury.  Denies numbness or tingling.  Review of Systems  Positive:  Negative:   Physical Exam  BP 139/83 (BP Location: Left Arm)   Pulse 96   Temp 97.9 F (36.6 C)   Resp 18   SpO2 99%  Gen:   Awake, no distress   Resp:  Normal effort  MSK:   Moves extremities without difficulty  Other:  Palpable pulse. Surgically missing partial great toe. Swelling present to the bilateral malleoli of the left ankle and into the lower leg. Compartmetns still soft. Sensation reproted intact.  She does have some tenderness to the distal tib but mainly seems like referring pain.  No tenderness to the anterior knee, femur.  No tenderness on pelvic shape.  No abdominal tenderness.  No chest tenderness.  No scalp tenderness palpation.  No cervical spine tenderness.   Medical Decision Making  Medically screening exam initiated at 2:39 PM.  Appropriate orders placed.  Kara Kelley was informed that the remainder of the evaluation will be completed by another provider, this initial triage assessment does not replace that evaluation, and the importance of remaining in the ED until their evaluation is complete.  XR ordered. Labs ordered. Patient reports mechanical fall and has been on the floor, labs ordered. Charge nurse alert for hallway bed.    Bernis Ernst, PA-C 06/21/24 1453

## 2024-06-21 NOTE — ED Provider Notes (Signed)
 [place] EMERGENCY DEPARTMENT AT [facility] Provider Note   CSN: [id number] Arrival date & time: 06/21/24  1211     Patient presents with: Ankle Pain   Kara Kelley is a 68 y.o. female.   Pt s/p fall at home last night - had gotten up to kitchen, was coming back to bed and was stepping up on stool when it fell twisting left ankle. C/o left ankle pain, constant, dull, non radiating. Denies other pain or injury. No head injury or loc. No headache. No new neck or back pain. No chest pain or sob. No abd pain or nv. No other extremity pain or injury. Skin intact. No numbness/weakness.   The history is provided by the patient, a relative and medical records.  Ankle Pain Associated symptoms: no fever and no neck pain        Prior to Admission medications   Medication Sig Start Date End Date Taking? Authorizing Provider  Accu-Chek FastClix Lancets MISC Use as directed to check blood sugars 2 times per day dx: e11.65 12/29/23   Tobie Gaines, DO  Accu-Chek Softclix Lancets lancets Use as instructed to check blood sugars twice daily E11.69 12/29/23   Tobie Gaines, DO  acetaminophen  (TYLENOL ) 325 MG tablet Take 1-2 tablets (325-650 mg total) by mouth every 4 (four) hours as needed for mild pain. 08/31/22   Setzer, Sandra J, PA-C  albuterol  (VENTOLIN  HFA) 108 (90 Base) MCG/ACT inhaler Inhale 1-2 puffs into the lungs every 6 (six) hours as needed for wheezing or shortness of breath. 12/15/23   Kara Dorn NOVAK, MD  alendronate  (FOSAMAX ) 10 MG tablet Take 1 tablet (10 mg total) by mouth daily before breakfast. Take with a full glass of water on an empty stomach. 06/06/24 06/06/25  Tobie Gaines, DO  atorvastatin  (LIPITOR) 10 MG tablet Take 1 tablet (10 mg total) by mouth daily. 06/06/24 09/04/24  Tobie Gaines, DO  benzonatate  (TESSALON  PERLES) 100 MG capsule Take 1 capsule (100 mg total) by mouth 3 (three) times daily as needed for cough. 03/22/24   Zheng, Michael, DO  Blood Glucose Monitoring  Suppl (ONETOUCH VERIO REFLECT) w/Device KIT Use to check blood sugar up to 3 times a day 12/05/23   Atway, Rayann N, DO  buPROPion  (WELLBUTRIN  XL) 300 MG 24 hr tablet Take 1 tablet (300 mg total) by mouth in the morning and at bedtime. 06/06/24   Tobie Gaines, DO  Continuous Glucose Receiver (FREESTYLE LIBRE 3 READER) DEVI Use to check your blood sugar continuously. 04/04/24   Elicia Sharper, DO  Continuous Glucose Sensor (FREESTYLE LIBRE 3 PLUS SENSOR) MISC Change sensor every 15 days. Use to check your blood sugar continuously. 04/04/24   Zheng, Michael, DO  diclofenac  Sodium (VOLTAREN ) 1 % GEL Apply 4 g topically 4 (four) times daily. 02/06/24   Tawkaliyar, Roya, DO  docusate sodium  (COLACE) 100 MG capsule Take 1 capsule (100 mg total) by mouth every 12 (twelve) hours. 03/24/24   Vicky Charleston, PA-C  DULoxetine  (CYMBALTA ) 60 MG capsule Take 1 capsule (60 mg total) by mouth daily. 06/06/24 06/06/25  Tobie Gaines, DO  empagliflozin  (JARDIANCE ) 10 MG TABS tablet Take 1 tablet (10 mg total) by mouth daily. 02/27/24   Tobie Gaines, DO  fluconazole  (DIFLUCAN ) 150 MG tablet Take 1 tablet (150 mg total) by mouth daily. 04/22/24   Azadegan, Maryam, MD  fluticasone  (FLONASE ) 50 MCG/ACT nasal spray Place 1 spray into both nostrils daily. 12/15/23   Kara Dorn NOVAK, MD  fluticasone -salmeterol (ADVAIR)  250-50 MCG/ACT AEPB INHALE 1 PUFF BY MOUTH INTO LUNGS TWICE DAILY IN THE MORNING AND AT BEDTIME 02/06/24   Kara Dorn NOVAK, MD  glucose blood (ONETOUCH ULTRA) test strip TEST AS DIRECTED UPTO 3 TIMES DAILY 05/28/24   Tobie Gaines, DO  insulin  lispro (HUMALOG  KWIKPEN) 100 UNIT/ML KwikPen ADMINISTER 5 UNITS UNDER THE SKIN THREE TIMES DAILY WITH MEALS 11/09/23   Tobie Gaines, DO  Insulin  Pen Needle (PEN NEEDLES) 32G X 4 MM MISC Use new pen needle for each insulin  injection up to 4 times a day. 04/04/24   Elicia Sharper, DO  Insulin  Syringe-Needle U-100 (INSULIN  SYRINGE .3CC/30GX5/16) 30G X 5/16 0.3 ML MISC Use to inject  Levemir  twice daily. 09/06/22   Newlin, Enobong, MD  Iron , Ferrous Sulfate , 325 (65 Fe) MG TABS Take 325 mg by mouth daily. 06/08/23   Tobie Gaines, DO  Lancet Device MISC 1 each by Does not apply route in the morning, at noon, and at bedtime. May substitute to any manufacturer covered by patient's insurance. 12/08/22   Lorren Greig PARAS, NP  lidocaine  (LIDODERM ) 5 % Place 1 patch onto the skin daily. Remove & Discard patch within 12 hours or as directed by MD 02/06/24   Heddy Barren, DO  LINZESS  145 MCG CAPS capsule Take 145 mcg by mouth daily before breakfast.  06/25/19   [provider]  losartan -hydrochlorothiazide (HYZAAR) 100-25 MG tablet Take 1 tablet by mouth daily. 02/27/24 02/26/25  Tobie Gaines, DO  metFORMIN  (GLUCOPHAGE -XR) 500 MG 24 hr tablet Take 2 tablets (1,000 mg total) by mouth 2 (two) times daily with a meal. 02/27/24   Tobie Gaines, DO  mineral oil-hydrophilic petrolatum  (AQUAPHOR) ointment Apply topically as needed for dry skin. 04/18/24   Azadegan, Maryam, MD  Multiple Vitamins-Minerals (MULTIVITAMIN WITH MINERALS) tablet Take 1 tablet by mouth daily. Alive    [provider]  omeprazole  (PRILOSEC) 40 MG capsule TAKE 1 CAPSULE(40 MG) BY MOUTH DAILY BEFORE BREAKFAST 02/27/24   Tobie Gaines, DO  polyethylene glycol powder (GLYCOLAX /MIRALAX ) 17 GM/SCOOP powder Take 17 g by mouth 2 (two) times daily. Until daily soft stools OTC 03/24/24   Vicky Charleston, PA-C  Semaglutide , 1 MG/DOSE, 4 MG/3ML SOPN Inject 1 mg into the skin once a week for 4 doses. 07/29/24 08/20/24  Harrie Bruckner, DO  Semaglutide , 2 MG/DOSE, (OZEMPIC , 2 MG/DOSE,) 8 MG/3ML SOPN Inject 2 mg into the skin once a week. 08/29/24   Harrie Bruckner, DO  Semaglutide ,0.25 or 0.5MG /DOS, 2 MG/3ML SOPN Inject 0.5 mg into the skin once a week for 4 doses. 07/03/24 07/25/24  Harrie Bruckner, DO  traZODone  (DESYREL ) 50 MG tablet Take 1 tablet (50 mg total) by mouth at bedtime as needed for sleep. 06/06/24  09/04/24  Tobie Gaines, DO  TRESIBA  FLEXTOUCH 100 UNIT/ML FlexTouch Pen ADMINISTER 15 UNITS UNDER THE SKIN TWICE DAILY 06/21/24   Tobie Gaines, DO    Allergies: Patient has no known allergies.    Review of Systems  Constitutional:  Negative for fever.  Respiratory:  Negative for shortness of breath.   Cardiovascular:  Negative for chest pain.  Gastrointestinal:  Negative for abdominal pain, nausea and vomiting.  Genitourinary:  Negative for flank pain.  Musculoskeletal:  Negative for neck pain.  Skin:  Negative for wound.  Neurological:  Negative for weakness, numbness and headaches.  Hematological:  Does not bruise/bleed easily.  Psychiatric/Behavioral:  Negative for confusion.     Updated Vital Signs BP 121/86 (BP Location: Left Arm)   Pulse 80  Temp 97.9 F (36.6 C)   Resp 17   Ht 1.549 m (5' 1)   Wt 91.8 kg   SpO2 100%   BMI 38.24 kg/m   Physical Exam Vitals and nursing note reviewed.  Constitutional:      Appearance: Normal appearance. She is well-developed.  HENT:     Head: Atraumatic.     Comments: No head, scalp, or neck contusion or bruising.     Nose: Nose normal.     Mouth/Throat:     Mouth: Mucous membranes are moist.  Eyes:     General: No scleral icterus.    Conjunctiva/sclera: Conjunctivae normal.     Pupils: Pupils are equal, round, and reactive to light.  Neck:     Trachea: No tracheal deviation.  Cardiovascular:     Rate and Rhythm: Normal rate and regular rhythm.     Pulses: Normal pulses.     Heart sounds: Normal heart sounds. No murmur heard.    No friction rub. No gallop.  Pulmonary:     Effort: Pulmonary effort is normal. No respiratory distress.     Breath sounds: Normal breath sounds.  Abdominal:     General: Bowel sounds are normal. There is no distension.     Palpations: Abdomen is soft.     Tenderness: There is no abdominal tenderness.  Musculoskeletal:        General: No swelling.     Cervical back: Normal range of motion and  neck supple. No rigidity or tenderness. No muscular tenderness.     Comments: CTLS spine, non tender, aligned, no step off. Sts and tenderness med and lat malleolus left ankle. No gross deformity noted. Distal pulses palp. Foot is of normal color and warmth. Normal cap refill distally. No prox tib fib or knee pain or tenderness. Otherwise good rom bil extremities without pain or other focal bony tenderness.   Skin:    General: Skin is warm and dry.     Findings: No rash.  Neurological:     Mental Status: She is alert.     Comments: Alert, speech normal. LLE and foot nvi with intact motor/sens fxn.   Psychiatric:        Mood and Affect: Mood normal.     (all labs ordered are listed, but only abnormal results are displayed) Results for orders placed or performed during the hospital encounter of 06/21/24  CK   Collection Time: 06/21/24  2:46 PM  Result Value Ref Range   Total CK 96 38 - 234 U/L  CBC with Differential   Collection Time: 06/21/24  2:46 PM  Result Value Ref Range   WBC 8.7 4.0 - 10.5 K/uL   RBC 5.02 3.87 - 5.11 MIL/uL   Hemoglobin 12.5 12.0 - 15.0 g/dL   HCT 61.5 63.9 - 53.9 %   MCV 76.5 (L) 80.0 - 100.0 fL   MCH 24.9 (L) 26.0 - 34.0 pg   MCHC 32.6 30.0 - 36.0 g/dL   RDW 85.3 88.4 - 84.4 %   Platelets 194 150 - 400 K/uL   nRBC 0.0 0.0 - 0.2 %   Neutrophils Relative % 70 %   Neutro Abs 6.1 1.7 - 7.7 K/uL   Lymphocytes Relative 25 %   Lymphs Abs 2.2 0.7 - 4.0 K/uL   Monocytes Relative 5 %   Monocytes Absolute 0.4 0.1 - 1.0 K/uL   Eosinophils Relative 0 %   Eosinophils Absolute 0.0 0.0 - 0.5 K/uL   Basophils Relative  0 %   Basophils Absolute 0.0 0.0 - 0.1 K/uL   Immature Granulocytes 0 %   Abs Immature Granulocytes 0.03 0.00 - 0.07 K/uL  Comprehensive metabolic panel   Collection Time: 06/21/24  2:46 PM  Result Value Ref Range   Sodium 134 (L) 135 - 145 mmol/L   Potassium 3.6 3.5 - 5.1 mmol/L   Chloride 98 98 - 111 mmol/L   CO2 22 22 - 32 mmol/L   Glucose,  Bld 135 (H) 70 - 99 mg/dL   BUN 10 8 - 23 mg/dL   Creatinine, Ser 8.64 (H) 0.44 - 1.00 mg/dL   Calcium  9.3 8.9 - 10.3 mg/dL   Total Protein 7.7 6.5 - 8.1 g/dL   Albumin 4.3 3.5 - 5.0 g/dL   AST 21 15 - 41 U/L   ALT 18 0 - 44 U/L   Alkaline Phosphatase 84 38 - 126 U/L   Total Bilirubin 0.5 0.0 - 1.2 mg/dL   GFR, Estimated 43 (L) >60 mL/min   Anion gap 14 5 - 15  I-stat chem 8, ed   Collection Time: 06/21/24  3:11 PM  Result Value Ref Range   Sodium 137 135 - 145 mmol/L   Potassium 3.7 3.5 - 5.1 mmol/L   Chloride 100 98 - 111 mmol/L   BUN 11 8 - 23 mg/dL   Creatinine, Ser 8.59 (H) 0.44 - 1.00 mg/dL   Glucose, Bld 863 (H) 70 - 99 mg/dL   Calcium , Ion 1.08 (L) 1.15 - 1.40 mmol/L   TCO2 22 22 - 32 mmol/L   Hemoglobin 13.9 12.0 - 15.0 g/dL   HCT 58.9 63.9 - 53.9 %   *Note: Due to a large number of results and/or encounters for the requested time period, some results have not been displayed. A complete set of results can be found in Results Review.   DG Foot Complete Left Result Date: 06/21/2024 CLINICAL DATA:  Pain after fall. EXAM: LEFT ANKLE COMPLETE - 3+ VIEW; LEFT FOOT - COMPLETE 3+ VIEW COMPARISON:  Left foot radiographs dated 04/25/2023. FINDINGS: Oblique transsyndesmotic fracture of the distal fibular metadiaphysis with approximately 3 mm of lateral displacement of the distal fracture component. Minimally displaced fracture of the medial malleolus. There is associated lateral subluxation of the tibiotalar joint. Soft tissue swelling of the ankle. Redemonstrated remote postsurgical changes of the first metatarsal head, base of the first proximal phalanx, distal half of the distal phalanx of the great toe, head and neck of the fifth metatarsal, and the distal fifth proximal phalanx. IMPRESSION: 1. Acute mildly displaced bimalleolar ankle fractures with associated lateral subluxation of the tibiotalar joint. 2. Redemonstrated remote postsurgical changes of the forefoot. Electronically  Signed   By: Harrietta Sherry M.D.   On: 06/21/2024 16:46   DG Ankle Complete Left Result Date: 06/21/2024 CLINICAL DATA:  Pain after fall. EXAM: LEFT ANKLE COMPLETE - 3+ VIEW; LEFT FOOT - COMPLETE 3+ VIEW COMPARISON:  Left foot radiographs dated 04/25/2023. FINDINGS: Oblique transsyndesmotic fracture of the distal fibular metadiaphysis with approximately 3 mm of lateral displacement of the distal fracture component. Minimally displaced fracture of the medial malleolus. There is associated lateral subluxation of the tibiotalar joint. Soft tissue swelling of the ankle. Redemonstrated remote postsurgical changes of the first metatarsal head, base of the first proximal phalanx, distal half of the distal phalanx of the great toe, head and neck of the fifth metatarsal, and the distal fifth proximal phalanx. IMPRESSION: 1. Acute mildly displaced bimalleolar ankle fractures with associated  lateral subluxation of the tibiotalar joint. 2. Redemonstrated remote postsurgical changes of the forefoot. Electronically Signed   By: Harrietta Sherry M.D.   On: 06/21/2024 16:46   DG Chest 2 View Result Date: 06/21/2024 CLINICAL DATA:  Pain after fall. EXAM: CHEST - 2 VIEW COMPARISON:  09/20/2021. FINDINGS: The heart size and mediastinal contours are within normal limits. No focal consolidation, pleural effusion, or pneumothorax. Calcified granulomas in the left mid to lower lung zone. ACDF hardware. Degenerative changes of the thoracic spine. No acute osseous abnormality identified. IMPRESSION: No acute findings in the chest. Electronically Signed   By: Harrietta Sherry M.D.   On: 06/21/2024 16:35   DG Knee Complete 4 Views Left Result Date: 06/21/2024 CLINICAL DATA:  Pain after fall. EXAM: LEFT KNEE - COMPLETE 4+ VIEW COMPARISON:  Left knee radiographs dated 10/08/2016. FINDINGS: No acute fracture or dislocation. No sizeable joint effusion. No significant arthropathy. Soft tissues are unremarkable. IMPRESSION: No acute  osseous abnormality. Electronically Signed   By: Harrietta Sherry M.D.   On: 06/21/2024 16:32   DG Pelvis 1-2 Views Result Date: 06/21/2024 CLINICAL DATA:  Pain after fall. EXAM: PELVIS - 1-2 VIEW COMPARISON:  02/27/2024. FINDINGS: No acute fracture or dislocation. Chronic healed deformity of the right inferior pubic ramus. Femoral heads are seated within the acetabula. Sacroiliac joints and pubic symphysis appear anatomically aligned. Postoperative changes of the lumbar spine. IMPRESSION: No acute osseous abnormality. Electronically Signed   By: Harrietta Sherry M.D.   On: 06/21/2024 16:31   MR Lumbar Spine Wo Contrast Result Date: 06/17/2024 CLINICAL DATA:  Acute onset right proximal lower extremity weakness EXAM: MRI LUMBAR SPINE WITHOUT CONTRAST TECHNIQUE: Multiplanar, multisequence MR imaging of the lumbar spine was performed. No intravenous contrast was administered. COMPARISON:  August 20, 2022 FINDINGS: Bone marrow: No significant abnormality Conus and cauda equina: No significant abnormality Paraspinal tissues: No significant abnormality L1-L2: The disc is normal.  Mild facet arthropathy L2-L3: There is a mild disc bulge. Mild facet arthropathy with mild facet enlargement. There is mild spinal stenosis L3-L4: Previous PLIF with posterolateral fusion. No spinal stenosis or foraminal stenosis L4-L5: Previous PLIF with posterolateral fusion. No spinal stenosis or foraminal stenosis L5-S1: Previous PLIF with posterolateral fusion. No spinal stenosis or foraminal stenosis IMPRESSION: Previous PLIF with posterolateral fusion at L3-4, L4-5 and L5-S1. There is no spinal stenosis at these levels. There is mild spinal stenosis at L2-3 due to mild disc bulge and facet arthropathy. Electronically Signed   By: Nancyann Burns M.D.   On: 06/17/2024 15:37     EKG: None  Radiology: DG Foot Complete Left Result Date: 06/21/2024 CLINICAL DATA:  Pain after fall. EXAM: LEFT ANKLE COMPLETE - 3+ VIEW; LEFT FOOT -  COMPLETE 3+ VIEW COMPARISON:  Left foot radiographs dated 04/25/2023. FINDINGS: Oblique transsyndesmotic fracture of the distal fibular metadiaphysis with approximately 3 mm of lateral displacement of the distal fracture component. Minimally displaced fracture of the medial malleolus. There is associated lateral subluxation of the tibiotalar joint. Soft tissue swelling of the ankle. Redemonstrated remote postsurgical changes of the first metatarsal head, base of the first proximal phalanx, distal half of the distal phalanx of the great toe, head and neck of the fifth metatarsal, and the distal fifth proximal phalanx. IMPRESSION: 1. Acute mildly displaced bimalleolar ankle fractures with associated lateral subluxation of the tibiotalar joint. 2. Redemonstrated remote postsurgical changes of the forefoot. Electronically Signed   By: Harrietta Sherry M.D.   On: 06/21/2024 16:46   DG  Ankle Complete Left Result Date: 06/21/2024 CLINICAL DATA:  Pain after fall. EXAM: LEFT ANKLE COMPLETE - 3+ VIEW; LEFT FOOT - COMPLETE 3+ VIEW COMPARISON:  Left foot radiographs dated 04/25/2023. FINDINGS: Oblique transsyndesmotic fracture of the distal fibular metadiaphysis with approximately 3 mm of lateral displacement of the distal fracture component. Minimally displaced fracture of the medial malleolus. There is associated lateral subluxation of the tibiotalar joint. Soft tissue swelling of the ankle. Redemonstrated remote postsurgical changes of the first metatarsal head, base of the first proximal phalanx, distal half of the distal phalanx of the great toe, head and neck of the fifth metatarsal, and the distal fifth proximal phalanx. IMPRESSION: 1. Acute mildly displaced bimalleolar ankle fractures with associated lateral subluxation of the tibiotalar joint. 2. Redemonstrated remote postsurgical changes of the forefoot. Electronically Signed   By: Harrietta Sherry M.D.   On: 06/21/2024 16:46   DG Chest 2 View Result Date:  06/21/2024 CLINICAL DATA:  Pain after fall. EXAM: CHEST - 2 VIEW COMPARISON:  09/20/2021. FINDINGS: The heart size and mediastinal contours are within normal limits. No focal consolidation, pleural effusion, or pneumothorax. Calcified granulomas in the left mid to lower lung zone. ACDF hardware. Degenerative changes of the thoracic spine. No acute osseous abnormality identified. IMPRESSION: No acute findings in the chest. Electronically Signed   By: Harrietta Sherry M.D.   On: 06/21/2024 16:35   DG Knee Complete 4 Views Left Result Date: 06/21/2024 CLINICAL DATA:  Pain after fall. EXAM: LEFT KNEE - COMPLETE 4+ VIEW COMPARISON:  Left knee radiographs dated 10/08/2016. FINDINGS: No acute fracture or dislocation. No sizeable joint effusion. No significant arthropathy. Soft tissues are unremarkable. IMPRESSION: No acute osseous abnormality. Electronically Signed   By: Harrietta Sherry M.D.   On: 06/21/2024 16:32   DG Pelvis 1-2 Views Result Date: 06/21/2024 CLINICAL DATA:  Pain after fall. EXAM: PELVIS - 1-2 VIEW COMPARISON:  02/27/2024. FINDINGS: No acute fracture or dislocation. Chronic healed deformity of the right inferior pubic ramus. Femoral heads are seated within the acetabula. Sacroiliac joints and pubic symphysis appear anatomically aligned. Postoperative changes of the lumbar spine. IMPRESSION: No acute osseous abnormality. Electronically Signed   By: Harrietta Sherry M.D.   On: 06/21/2024 16:31     Procedures   Medications Ordered in the ED  morphine  (PF) 4 MG/ML injection 4 mg (has no administration in time range)  ondansetron  (ZOFRAN ) injection 4 mg (has no administration in time range)  oxyCODONE -acetaminophen  (PERCOCET/ROXICET) 5-325 MG per tablet 2 tablet (2 tablets Oral Given 06/21/24 1451)                                    Medical Decision Making Problems Addressed: Bimalleolar fracture of left ankle, closed, initial encounter: acute illness or injury with systemic symptoms that  poses a threat to life or bodily functions Fall from slip, trip, or stumble, initial encounter: acute illness or injury with systemic symptoms that poses a threat to life or bodily functions  Amount and/or Complexity of Data Reviewed Independent Historian:     Details: Family, hx External Data Reviewed: notes. Radiology: ordered and independent interpretation performed. Decision-making details documented in ED Course. Discussion of management or test interpretation with external provider(s): Orthopedics.  Risk Prescription drug management. Parenteral controlled substances. Decision regarding hospitalization.   Iv ns. Labs ordered/sent. Imaging ordered.   Differential diagnosis includes ankle fx, other fx/traumatic injury, etc. Dispo decision including potential  need for admission considered - will get labs and imaging and reassess.   Reviewed nursing notes and prior charts for additional history. External reports reviewed. Additional history from: family.  Labs reviewed/interpreted by me - wbc and hgb normal. Chem largely unremarkable. Ck normal.   Morphine  iv. Zofran  iv.   Xrays reviewed/interpreted by me - +bimall fx left ankle. Discussed w pt. Elevate, ice. Splint with improved position - with splint placement mild pressure applied to help improve the mild lat sublux.    Ortho consulted, discussed pt and imaging w Dr Beverley - he indicates splint and have f/u this Monday AM at 0830 in office.   Splint applied by ortho tech. Recheck pain improved. Intact distal pulse, normal cap refill in toes.  Pt indicates has family who can help her at home.   Rec close ortho f/u.  Return precautions provided.       Final diagnoses:  None    ED Discharge Orders     None          Bernard Drivers, MD 06/21/24 2213

## 2024-06-21 NOTE — ED Notes (Signed)
 Pt back from X-Ray.

## 2024-06-21 NOTE — Telephone Encounter (Addendum)
 I called and spoke to [place] C. With [facility] requesting a pa status, Lonell stated that she did not see a pa request on file. A new pa request for Ozempic  0.5 mg (starting dose) has been submitted and faxed along with last office notes and lab awaiting approval or denial.

## 2024-06-21 NOTE — ED Triage Notes (Signed)
 Pt here from home with c/o left ankle pain from a fall last night , has been on the floor all night

## 2024-06-21 NOTE — Discharge Instructions (Addendum)
 It was our pleasure to provide your ER care today - we hope that you feel better.  Elevate left foot/ankle as much as possible to help with the swelling and pain.  Keep splint clean and dry.  Use walker or wheelchair - no weight bearing on your left leg/foot until cleared to do so by orthopedist.   You may take percocet as need for pain. No driving for the next 6 hours or when taking percocet. Also, do not take tylenol  or other acetaminophen  containing medication, or hydrocodone ,  when taking percocet.   We discussed your case with our orthopedist, Dr Beverley - he indicates for you to follow up with him in his office this Monday morning at 830 AM - see attached info for office address.   Return to ER if worse, new symptoms, fevers, new/severe or intractable pain, numbness/weakness, or other concern.

## 2024-06-24 DIAGNOSIS — S82892A Other fracture of left lower leg, initial encounter for closed fracture: Secondary | ICD-10-CM | POA: Diagnosis not present

## 2024-06-24 NOTE — Progress Notes (Signed)
 Internal Medicine Clinic Attending  Case discussed with the resident at the time of the visit.  We reviewed the resident's history and exam and pertinent patient test results.  I agree with the assessment, diagnosis, and plan of care documented in the resident's note.

## 2024-06-25 ENCOUNTER — Encounter (HOSPITAL_BASED_OUTPATIENT_CLINIC_OR_DEPARTMENT_OTHER): Payer: Self-pay | Admitting: Orthopedic Surgery

## 2024-06-25 ENCOUNTER — Other Ambulatory Visit: Payer: Self-pay

## 2024-06-25 NOTE — Progress Notes (Signed)
   06/25/24 1625  PAT Phone Screen  Is the patient taking a GLP-1 receptor agonist? (S)  Yes (Ozempic  LD 06-17-24)  Has the patient been informed on holding medication? Yes  Do You Have Diabetes? Yes  Do You Have Hypertension? Yes  Have You Ever Been to the ER for Asthma? No  Have You Taken Oral Steroids in the Past 3 Months? No  Do you Take Phenteramine or any Other Diet Drugs? No  Recent  Lab Work, EKG, CXR? Yes  Where was this test performed? 06-21-24 CMP, CBC/diff, A1c 8.6  Do you have a history of heart problems? No  Any Recent Hospitalizations? No  Height 5' 1 (1.549 m)  Weight 93.4 kg  Pat Appointment Scheduled No  Reason for No Appointment Not Needed

## 2024-06-26 ENCOUNTER — Telehealth: Payer: Self-pay | Admitting: Student

## 2024-06-26 ENCOUNTER — Encounter

## 2024-06-26 NOTE — H&P (Signed)
 PREOPERATIVE H&P  Chief Complaint: displaced bimalleolar fracture of left ankle, Syndesmotic disruption of left ankle  HPI: Kara Kelley is a 68 y.o. female who presents with a diagnosis of displaced bimalleolar fracture of left ankle, Syndesmotic disruption of left ankle. Symptoms are rated as moderate to severe, and have been worsening.  This is significantly impairing activities of daily living.  She has elected for surgical management.   Past Medical History:  Diagnosis Date   Anemia    Anxiety    Arthritis    Asthma    Bacterial vaginosis 05/16/2022   Bunion    Callus    Candida vaginitis 09/16/2022   Cholelithiasis without obstruction 04/11/2022   Chronic pain    Cocaine abuse (HCC)    COPD (chronic obstructive pulmonary disease) (HCC)    Corns and callosities    Degenerative joint disease    Depression    Diabetes mellitus    Endometrial polyp    ETOH abuse    Gall stones    Gallbladder calculus with acute cholecystitis and no obstruction 04/11/2022   GERD (gastroesophageal reflux disease)    Headache    history of Migraines   Hepatitis C    Hep C   History of adenomatous polyp of colon 04/11/2022   History of endometrial ablation 02/06/2013   Hyperlipidemia    Hypertension    Insomnia    Spondylolisthesis of lumbar region    Substance abuse (HCC)    alcoholism   Tuberculosis 1985   Wears dentures    Wears glasses    Past Surgical History:  Procedure Laterality Date   ANTERIOR CERVICAL DECOMP/DISCECTOMY FUSION N/A 10/26/2021   Procedure: ACDF - C4-C5 - C5-C6 - C6-C7;  Surgeon: Louis Shove, MD;  Location: [facility];  Service: Neurosurgery;  Laterality: N/A;   BACK SURGERY  2018   CESAREAN SECTION     x3   CHOLECYSTECTOMY N/A 08/11/2016   Procedure: LAPAROSCOPIC CHOLECYSTECTOMY WITH   INTRAOPERATIVE CHOLANGIOGRAM;  Surgeon: Jina Nephew, MD;  Location: [facility];  Service: General;  Laterality: N/A;   COLONOSCOPY     Cotton Osteotomy w/ Graft Left 06/18/2009    Excision of Benign Lesion Right 01/30/2013   Rt Plantar   FOOT SURGERY     HAMMER TOE REPAIR Right 02/12/2016   RIGHT #5   Hammertoe Repair Left 06/18/2009   Lt #5   HYSTEROSCOPY N/A 06/20/2017   Procedure: DILATION AND CURETTAGE, HYSTEROSCOPY w/ Polypectomy;  Surgeon: Corene Coy, MD;  Location: [facility];  Service: Gynecology;  Laterality: N/A;   LUMBAR FUSION  2018   METATARSAL OSTEOTOMY Left 06/18/2009   #5   MULTIPLE TOOTH EXTRACTIONS     Nail Matrixectomy Left 06/18/2009   LT #1   OSTEOTOMY Right 01/30/2013   Rt #5   Phalangectomy Left 06/18/2009   LT #1   Phalangectomy Right 01/30/2013   Rt #1   TUBAL LIGATION     Social History   Socioeconomic History   Marital status: Single    Spouse name: Not on file   Number of children: 3   Years of education: Not on file   Highest education level: Not on file  Occupational History   Occupation: disablily  Tobacco Use   Smoking status: Former    Current packs/day: 0.00    Average packs/day: 0.8 packs/day for 47.0 years (35.3 ttl pk-yrs)    Types: Cigarettes    Start date: 69    Quit date: 2020    Years  since quitting: 5.7    Passive exposure: Past   Smokeless tobacco: Never  Vaping Use   Vaping status: Never Used  Substance and Sexual Activity   Alcohol use: No    Comment: no ETOH 6 yrs   Drug use: No    Comment: in rehab   Sexual activity: Not Currently    Birth control/protection: Post-menopausal  Other Topics Concern   Not on file  Social History Narrative   Not on file   Social Drivers of Health   Financial Resource Strain: Low Risk  (04/20/2023)   Overall Financial Resource Strain (CARDIA)    Difficulty of Paying Living Expenses: Not hard at all  Food Insecurity: No Food Insecurity (04/20/2023)   Hunger Vital Sign    Worried About Running Out of Food in the Last Year: Never true    Ran Out of Food in the Last Year: Never true  Transportation Needs: No Transportation Needs (04/20/2023)   PRAPARE -  Administrator, Civil Service (Medical): No    Lack of Transportation (Non-Medical): No  Physical Activity: Sufficiently Active (04/20/2023)   Exercise Vital Sign    Days of Exercise per Week: 3 days    Minutes of Exercise per Session: 60 min  Stress: No Stress Concern Present (04/20/2023)   [employer] of Occupational Health - Occupational Stress Questionnaire    Feeling of Stress : Only a little  Social Connections: Moderately Integrated (04/20/2023)   Social Connection and Isolation Panel    Frequency of Communication with Friends and Family: More than three times a week    Frequency of Social Gatherings with Friends and Family: More than three times a week    Attends Religious Services: More than 4 times per year    Active Member of [employer] or Organizations: Yes    Attends Banker Meetings: More than 4 times per year    Marital Status: Widowed   Family History  Problem Relation Age of Onset   Heart disease Mother    Diabetes Other        mat great aunt   Cirrhosis Other        mat great aunt   Cirrhosis Other        mat great uncles x 2   Colon cancer Neg Hx    Breast cancer Neg Hx    No Known Allergies Prior to Admission medications   Medication Sig Start Date End Date Taking? Authorizing Provider  Accu-Chek FastClix Lancets MISC Use as directed to check blood sugars 2 times per day dx: e11.65 12/29/23  Yes Tobie Gaines, DO  albuterol  (VENTOLIN  HFA) 108 (90 Base) MCG/ACT inhaler Inhale 1-2 puffs into the lungs every 6 (six) hours as needed for wheezing or shortness of breath. 12/15/23  Yes Kara Dorn NOVAK, MD  alendronate  (FOSAMAX ) 10 MG tablet Take 1 tablet (10 mg total) by mouth daily before breakfast. Take with a full glass of water on an empty stomach. 06/06/24 06/06/25 Yes Patel, Gaines, DO  atorvastatin  (LIPITOR) 10 MG tablet Take 1 tablet (10 mg total) by mouth daily. 06/06/24 09/04/24 Yes Tobie Gaines, DO  Blood Glucose Monitoring Suppl (ONETOUCH  VERIO REFLECT) w/Device KIT Use to check blood sugar up to 3 times a day 12/05/23  Yes Atway, Rayann N, DO  buPROPion  (WELLBUTRIN  XL) 300 MG 24 hr tablet Take 1 tablet (300 mg total) by mouth in the morning and at bedtime. 06/06/24  Yes Tobie Gaines, DO  DULoxetine  (CYMBALTA )  60 MG capsule Take 1 capsule (60 mg total) by mouth daily. 06/06/24 06/06/25 Yes Tobie Gaines, DO  empagliflozin  (JARDIANCE ) 10 MG TABS tablet Take 1 tablet (10 mg total) by mouth daily. 02/27/24  Yes Patel, Gaines, DO  fluticasone -salmeterol (ADVAIR) 250-50 MCG/ACT AEPB INHALE 1 PUFF BY MOUTH INTO LUNGS TWICE DAILY IN THE MORNING AND AT BEDTIME 02/06/24  Yes Kara Dorn NOVAK, MD  insulin  lispro (HUMALOG  KWIKPEN) 100 UNIT/ML KwikPen ADMINISTER 5 UNITS UNDER THE SKIN THREE TIMES DAILY WITH MEALS 11/09/23  Yes Patel, Amar, DO  Iron , Ferrous Sulfate , 325 (65 Fe) MG TABS Take 325 mg by mouth daily. 06/08/23  Yes Tobie Gaines, DO  LINZESS  145 MCG CAPS capsule Take 145 mcg by mouth daily before breakfast.  06/25/19  Yes [provider]  losartan -hydrochlorothiazide (HYZAAR) 100-25 MG tablet Take 1 tablet by mouth daily. 02/27/24 02/26/25 Yes Tobie Gaines, DO  metFORMIN  (GLUCOPHAGE -XR) 500 MG 24 hr tablet Take 2 tablets (1,000 mg total) by mouth 2 (two) times daily with a meal. 02/27/24  Yes Tobie Gaines, DO  mineral oil-hydrophilic petrolatum  (AQUAPHOR) ointment Apply topically as needed for dry skin. 04/18/24  Yes Azadegan, Maryam, MD  Multiple Vitamins-Minerals (MULTIVITAMIN WITH MINERALS) tablet Take 1 tablet by mouth daily. Alive   Yes [provider]  omeprazole  (PRILOSEC) 40 MG capsule TAKE 1 CAPSULE(40 MG) BY MOUTH DAILY BEFORE BREAKFAST 02/27/24  Yes Tobie Gaines, DO  oxyCODONE -acetaminophen  (PERCOCET/ROXICET) 5-325 MG tablet Take 1 tablet by mouth every 6 (six) hours as needed for severe pain (pain score 7-10). 06/21/24  Yes Steinl, Kevin, MD  polyethylene glycol powder (GLYCOLAX /MIRALAX ) 17 GM/SCOOP powder Take 17 g by mouth 2  (two) times daily. Until daily soft stools OTC 03/24/24  Yes Vicky Charleston, PA-C  traZODone  (DESYREL ) 50 MG tablet Take 1 tablet (50 mg total) by mouth at bedtime as needed for sleep. 06/06/24 09/04/24 Yes Tobie Gaines, DO  TRESIBA  FLEXTOUCH 100 UNIT/ML FlexTouch Pen ADMINISTER 15 UNITS UNDER THE SKIN TWICE DAILY 06/21/24  Yes Tobie Gaines, DO  Accu-Chek Softclix Lancets lancets Use as instructed to check blood sugars twice daily E11.69 12/29/23   Tobie Gaines, DO  Continuous Glucose Receiver (FREESTYLE LIBRE 3 READER) DEVI Use to check your blood sugar continuously. 04/04/24   Elicia Sharper, DO  Continuous Glucose Sensor (FREESTYLE LIBRE 3 PLUS SENSOR) MISC Change sensor every 15 days. Use to check your blood sugar continuously. 04/04/24   Zheng, Michael, DO  diclofenac  Sodium (VOLTAREN ) 1 % GEL Apply 4 g topically 4 (four) times daily. 02/06/24   Tawkaliyar, Roya, DO  fluconazole  (DIFLUCAN ) 150 MG tablet Take 1 tablet (150 mg total) by mouth daily. 04/22/24   Azadegan, Maryam, MD  glucose blood (ONETOUCH ULTRA) test strip TEST AS DIRECTED UPTO 3 TIMES DAILY 05/28/24   Tobie Gaines, DO  Insulin  Pen Needle (PEN NEEDLES) 32G X 4 MM MISC Use new pen needle for each insulin  injection up to 4 times a day. 04/04/24   Elicia Sharper, DO  Lancet Device MISC 1 each by Does not apply route in the morning, at noon, and at bedtime. May substitute to any manufacturer covered by patient's insurance. 12/08/22   Lorren Greig PARAS, NP  lidocaine  (LIDODERM ) 5 % Place 1 patch onto the skin daily. Remove & Discard patch within 12 hours or as directed by MD 02/06/24   Tawkaliyar, Roya, DO  Semaglutide , 1 MG/DOSE, 4 MG/3ML SOPN Inject 1 mg into the skin once a week for 4 doses. 07/29/24 08/20/24  Harrie Bruckner,  DO  Semaglutide , 2 MG/DOSE, (OZEMPIC , 2 MG/DOSE,) 8 MG/3ML SOPN Inject 2 mg into the skin once a week. 08/29/24   Harrie Bruckner, DO  Semaglutide ,0.25 or 0.5MG /DOS, 2 MG/3ML SOPN Inject 0.5 mg into the skin once a week  for 4 doses. 07/03/24 07/25/24  Harrie Bruckner, DO     Positive ROS: All other systems have been reviewed and were otherwise negative with the exception of those mentioned in the HPI and as above.  Physical Exam: General: Alert, no acute distress Cardiovascular: No pedal edema Respiratory: No cyanosis, no use of accessory musculature GI: No organomegaly, abdomen is soft and non-tender Skin: No lesions in the area of chief complaint Neurologic: Sensation intact distally Psychiatric: Patient is competent for consent with normal mood and affect Lymphatic: No axillary or cervical lymphadenopathy  MUSCULOSKELETAL: well fitted splint L lower leg, can wiggle toes, NVI   Imaging: xrays show a bimalleolar ankle fracture with some lateral subluxation   Assessment: displaced bimalleolar fracture of left ankle, Syndesmotic disruption of left ankle  Plan: Plan for Procedure(s): OPEN REDUCTION INTERNAL FIXATION (ORIF) ANKLE FRACTURE  The risks benefits and alternatives were discussed with the patient including but not limited to the risks of nonoperative treatment, versus surgical intervention including infection, bleeding, nerve injury,  blood clots, cardiopulmonary complications, morbidity, mortality, among others, and they were willing to proceed.   Weightbearing: NWB LLE Orthopedic devices: splint Showering: POD 3 Dressing: reinforce PRN Medicines: ASA, Oxy, Tylenol , Mobic , Zofran   Discharge: home Follow up: 07/12/24 at 11:45am with me    Gerard CHRISTELLA Large, PA-C Office [phone] 06/29/2024 2:40 PM

## 2024-06-26 NOTE — Telephone Encounter (Signed)
 Per [employer] rx automated line pa has been approved effective 06/21/24.  PA# [id number]   I called and spoke to the pharmacy per pharmacists the patient picked up Ozempic  1 mg on 06/22/24.

## 2024-06-26 NOTE — Telephone Encounter (Unsigned)
 Copied from CRM [id number]. Topic: Clinical - Lab/Test Results >> Jun 25, 2024  3:03 PM Graeme ORN wrote: Reason for CRM: Patient called. Has appt with Pharmacists 9/25. Is having surgery Tuesday - fell and broke ankle. Unable to come in will need to reschedule. Thank You

## 2024-06-27 NOTE — Telephone Encounter (Signed)
 RS 07/04/24  [facility]  [facility] Guide  Direct Dial : [phone]  Fax [phone]

## 2024-06-29 NOTE — Anesthesia Preprocedure Evaluation (Signed)
 Anesthesia Evaluation  Patient identified by MRN, date of birth, ID band Patient awake    Reviewed: Allergy & Precautions, H&P , NPO status , Patient's Chart, lab work & pertinent test results  Airway Mallampati: II   Neck ROM: full    Dental   Pulmonary asthma , COPD, former smoker Quit smoking 2020, 35 pack year history   breath sounds clear to auscultation       Cardiovascular hypertension, Pt. on medications  Rhythm:regular Rate:Normal     Neuro/Psych  Headaches PSYCHIATRIC DISORDERS Anxiety Depression       GI/Hepatic ,GERD  Controlled and Medicated,,(+)     substance abuse  alcohol use and cocaine use, Hepatitis -, C  Endo/Other  diabetes, Well Controlled, Type 2, Insulin  Dependent, Oral Hypoglycemic Agents Hyperthyroidism Class 3 obesityObesity BMI 39  Renal/GU Renal disease (cr 1.40)  negative genitourinary   Musculoskeletal  (+) Arthritis , Osteoarthritis,    Abdominal   Peds  Hematology negative hematology ROS (+)   Anesthesia Other Findings Ozempic  LD:   Reproductive/Obstetrics negative OB ROS                              Anesthesia Physical Anesthesia Plan  ASA: 3  Anesthesia Plan: General and Regional   Post-op Pain Management: Regional block* and Tylenol  PO (pre-op)*   Induction: Intravenous  PONV Risk Score and Plan: 3 and Ondansetron , Dexamethasone , Midazolam  and Treatment may vary due to age or medical condition  Airway Management Planned: LMA  Additional Equipment: None  Intra-op Plan:   Post-operative Plan: Extubation in OR  Informed Consent: I have reviewed the patients History and Physical, chart, labs and discussed the procedure including the risks, benefits and alternatives for the proposed anesthesia with the patient or authorized representative who has indicated his/her understanding and acceptance.     Dental advisory given  Plan Discussed with:  CRNA, Anesthesiologist and Surgeon  Anesthesia Plan Comments:          Anesthesia Quick Evaluation

## 2024-07-01 ENCOUNTER — Other Ambulatory Visit

## 2024-07-02 ENCOUNTER — Encounter (HOSPITAL_BASED_OUTPATIENT_CLINIC_OR_DEPARTMENT_OTHER)
Admission: RE | Disposition: A | Discharge status: Home / Self Care | Payer: Self-pay | Source: Home / Self Care | Attending: Orthopedic Surgery

## 2024-07-02 ENCOUNTER — Ambulatory Visit (HOSPITAL_BASED_OUTPATIENT_CLINIC_OR_DEPARTMENT_OTHER): Admitting: Anesthesiology

## 2024-07-02 ENCOUNTER — Other Ambulatory Visit: Payer: Self-pay

## 2024-07-02 ENCOUNTER — Ambulatory Visit (HOSPITAL_BASED_OUTPATIENT_CLINIC_OR_DEPARTMENT_OTHER)
Admission: RE | Admit: 2024-07-02 | Discharge: 2024-07-02 | Disposition: A | Discharge status: Home / Self Care | Attending: Orthopedic Surgery | Admitting: Orthopedic Surgery

## 2024-07-02 ENCOUNTER — Ambulatory Visit (HOSPITAL_BASED_OUTPATIENT_CLINIC_OR_DEPARTMENT_OTHER)

## 2024-07-02 ENCOUNTER — Encounter (HOSPITAL_BASED_OUTPATIENT_CLINIC_OR_DEPARTMENT_OTHER): Admitting: Anesthesiology

## 2024-07-02 ENCOUNTER — Encounter (HOSPITAL_BASED_OUTPATIENT_CLINIC_OR_DEPARTMENT_OTHER): Payer: Self-pay | Admitting: Orthopedic Surgery

## 2024-07-02 DIAGNOSIS — N1832 Chronic kidney disease, stage 3b: Secondary | ICD-10-CM | POA: Diagnosis not present

## 2024-07-02 DIAGNOSIS — S93432A Sprain of tibiofibular ligament of left ankle, initial encounter: Secondary | ICD-10-CM | POA: Diagnosis not present

## 2024-07-02 DIAGNOSIS — X58XXXA Exposure to other specified factors, initial encounter: Secondary | ICD-10-CM | POA: Diagnosis not present

## 2024-07-02 DIAGNOSIS — S82842A Displaced bimalleolar fracture of left lower leg, initial encounter for closed fracture: Secondary | ICD-10-CM | POA: Insufficient documentation

## 2024-07-02 DIAGNOSIS — J449 Chronic obstructive pulmonary disease, unspecified: Secondary | ICD-10-CM | POA: Diagnosis not present

## 2024-07-02 DIAGNOSIS — S82842D Displaced bimalleolar fracture of left lower leg, subsequent encounter for closed fracture with routine healing: Secondary | ICD-10-CM | POA: Diagnosis not present

## 2024-07-02 DIAGNOSIS — Z01818 Encounter for other preprocedural examination: Secondary | ICD-10-CM

## 2024-07-02 DIAGNOSIS — I10 Essential (primary) hypertension: Secondary | ICD-10-CM | POA: Diagnosis not present

## 2024-07-02 DIAGNOSIS — I129 Hypertensive chronic kidney disease with stage 1 through stage 4 chronic kidney disease, or unspecified chronic kidney disease: Secondary | ICD-10-CM | POA: Diagnosis not present

## 2024-07-02 DIAGNOSIS — G8918 Other acute postprocedural pain: Secondary | ICD-10-CM | POA: Diagnosis not present

## 2024-07-02 DIAGNOSIS — S93432D Sprain of tibiofibular ligament of left ankle, subsequent encounter: Secondary | ICD-10-CM

## 2024-07-02 DIAGNOSIS — E1122 Type 2 diabetes mellitus with diabetic chronic kidney disease: Secondary | ICD-10-CM | POA: Diagnosis not present

## 2024-07-02 HISTORY — PX: ORIF ANKLE FRACTURE: SHX5408

## 2024-07-02 LAB — GLUCOSE, CAPILLARY
Glucose-Capillary: 145 mg/dL — ABNORMAL HIGH (ref 70–99)
Glucose-Capillary: 154 mg/dL — ABNORMAL HIGH (ref 70–99)

## 2024-07-02 SURGERY — OPEN REDUCTION INTERNAL FIXATION (ORIF) ANKLE FRACTURE
Anesthesia: Regional | Site: Ankle | Laterality: Left

## 2024-07-02 MED ORDER — DEXMEDETOMIDINE HCL IN NACL 80 MCG/20ML IV SOLN
INTRAVENOUS | Status: DC | PRN
  Administered 2024-07-02: 8 ug via INTRAVENOUS

## 2024-07-02 MED ORDER — MIDAZOLAM HCL 2 MG/2ML IJ SOLN
1.0000 mg | Freq: Once | INTRAMUSCULAR | Status: AC
  Administered 2024-07-02: 1 mg via INTRAVENOUS

## 2024-07-02 MED ORDER — FENTANYL CITRATE (PF) 100 MCG/2ML IJ SOLN
50.0000 ug | Freq: Once | INTRAMUSCULAR | Status: AC
  Administered 2024-07-02: 50 ug via INTRAVENOUS

## 2024-07-02 MED ORDER — ACETAMINOPHEN 500 MG PO TABS
1000.0000 mg | ORAL_TABLET | Freq: Once | ORAL | Status: AC
  Administered 2024-07-02: 1000 mg via ORAL

## 2024-07-02 MED ORDER — HYDROMORPHONE HCL 1 MG/ML IJ SOLN: 0.2500 mg | INTRAMUSCULAR | Status: DC | PRN

## 2024-07-02 MED ORDER — CEFAZOLIN SODIUM-DEXTROSE 2-4 GM/100ML-% IV SOLN
2.0000 g | INTRAVENOUS | Status: AC
  Administered 2024-07-02: 2 g via INTRAVENOUS

## 2024-07-02 MED ORDER — MELOXICAM 15 MG PO TABS: 15.0000 mg | ORAL_TABLET | Freq: Every day | ORAL | 0 refills | Status: AC | PRN

## 2024-07-02 MED ORDER — POVIDONE-IODINE 10 % EX SWAB
2.0000 | Freq: Once | CUTANEOUS | Status: DC
Start: 2024-07-02 — End: 2024-07-02

## 2024-07-02 MED ORDER — ONDANSETRON HCL 4 MG/2ML IJ SOLN
INTRAMUSCULAR | Status: AC
  Filled 2024-07-02: qty 2

## 2024-07-02 MED ORDER — FENTANYL CITRATE (PF) 100 MCG/2ML IJ SOLN
INTRAMUSCULAR | Status: AC
  Filled 2024-07-02: qty 2

## 2024-07-02 MED ORDER — OXYCODONE HCL 5 MG PO TABS: 5.0000 mg | ORAL_TABLET | Freq: Two times a day (BID) | ORAL | 0 refills | Status: DC | PRN

## 2024-07-02 MED ORDER — BUPIVACAINE-EPINEPHRINE (PF) 0.5% -1:200000 IJ SOLN
INTRAMUSCULAR | Status: DC | PRN
  Administered 2024-07-02: 15 mL via PERINEURAL
  Administered 2024-07-02: 25 mL via PERINEURAL

## 2024-07-02 MED ORDER — ASPIRIN 81 MG PO TBEC: 81.0000 mg | DELAYED_RELEASE_TABLET | Freq: Two times a day (BID) | ORAL | 0 refills | Status: DC

## 2024-07-02 MED ORDER — CEFAZOLIN SODIUM-DEXTROSE 2-4 GM/100ML-% IV SOLN
INTRAVENOUS | Status: AC
  Filled 2024-07-02: qty 100

## 2024-07-02 MED ORDER — DEXAMETHASONE SODIUM PHOSPHATE 10 MG/ML IJ SOLN
INTRAMUSCULAR | Status: AC
Start: 2024-07-02 — End: 2024-07-02
  Filled 2024-07-02: qty 1

## 2024-07-02 MED ORDER — SUCCINYLCHOLINE CHLORIDE 200 MG/10ML IV SOSY
PREFILLED_SYRINGE | INTRAVENOUS | Status: DC | PRN
  Administered 2024-07-02: 100 mg via INTRAVENOUS

## 2024-07-02 MED ORDER — PHENYLEPHRINE HCL (PRESSORS) 10 MG/ML IV SOLN
INTRAVENOUS | Status: DC | PRN
  Administered 2024-07-02 (×2): 80 ug via INTRAVENOUS

## 2024-07-02 MED ORDER — 0.9 % SODIUM CHLORIDE (POUR BTL) OPTIME
TOPICAL | Status: DC | PRN
  Administered 2024-07-02: 1000 mL

## 2024-07-02 MED ORDER — ACETAMINOPHEN 500 MG PO TABS
ORAL_TABLET | ORAL | Status: AC
  Filled 2024-07-02: qty 2

## 2024-07-02 MED ORDER — LIDOCAINE 2% (20 MG/ML) 5 ML SYRINGE
INTRAMUSCULAR | Status: AC
  Filled 2024-07-02: qty 5

## 2024-07-02 MED ORDER — PROPOFOL 10 MG/ML IV BOLUS
INTRAVENOUS | Status: AC
  Filled 2024-07-02: qty 20

## 2024-07-02 MED ORDER — OXYCODONE HCL 5 MG/5ML PO SOLN: 5.0000 mg | Freq: Once | ORAL | Status: DC | PRN

## 2024-07-02 MED ORDER — LACTATED RINGERS IV SOLN: INTRAVENOUS | Status: DC

## 2024-07-02 MED ORDER — DEXAMETHASONE SODIUM PHOSPHATE 10 MG/ML IJ SOLN
INTRAMUSCULAR | Status: AC
  Filled 2024-07-02: qty 1

## 2024-07-02 MED ORDER — LIDOCAINE HCL (CARDIAC) PF 100 MG/5ML IV SOSY
PREFILLED_SYRINGE | INTRAVENOUS | Status: DC | PRN
  Administered 2024-07-02: 60 mg via INTRAVENOUS

## 2024-07-02 MED ORDER — OXYCODONE HCL 5 MG PO TABS: 5.0000 mg | ORAL_TABLET | Freq: Once | ORAL | Status: DC | PRN

## 2024-07-02 MED ORDER — ACETAMINOPHEN 500 MG PO TABS: 1000.0000 mg | ORAL_TABLET | Freq: Once | ORAL | Status: DC

## 2024-07-02 MED ORDER — PROPOFOL 10 MG/ML IV BOLUS
INTRAVENOUS | Status: DC | PRN
  Administered 2024-07-02: 150 mg via INTRAVENOUS
  Administered 2024-07-02 (×2): 50 mg via INTRAVENOUS
  Administered 2024-07-02: 100 mg via INTRAVENOUS

## 2024-07-02 MED ORDER — MIDAZOLAM HCL 2 MG/2ML IJ SOLN
INTRAMUSCULAR | Status: AC
  Filled 2024-07-02: qty 2

## 2024-07-02 MED ORDER — ONDANSETRON 4 MG PO TBDP: 4.0000 mg | ORAL_TABLET | Freq: Three times a day (TID) | ORAL | 0 refills | Status: AC | PRN

## 2024-07-02 MED ORDER — FENTANYL CITRATE (PF) 100 MCG/2ML IJ SOLN
INTRAMUSCULAR | Status: DC | PRN
  Administered 2024-07-02 (×2): 50 ug via INTRAVENOUS

## 2024-07-02 MED ORDER — ONDANSETRON HCL 4 MG/2ML IJ SOLN
4.0000 mg | Freq: Once | INTRAMUSCULAR | Status: AC | PRN
  Administered 2024-07-02: 4 mg via INTRAVENOUS

## 2024-07-02 MED ORDER — AMISULPRIDE (ANTIEMETIC) 5 MG/2ML IV SOLN: 10.0000 mg | Freq: Once | INTRAVENOUS | Status: DC | PRN

## 2024-07-02 SURGICAL SUPPLY — 59 items
BIT DRILL 122X3.5XAO NS (BIT) IMPLANT
BIT DRILL 2.6X VARIAX 2 (BIT) IMPLANT
BIT DRILL SRG 2.7XCANN AO CPLG (BIT) IMPLANT
BLADE SURG 15 STRL LF DISP TIS (BLADE) ×2 IMPLANT
BNDG COHESIVE 4X5 TAN STRL LF (GAUZE/BANDAGES/DRESSINGS) ×1 IMPLANT
BNDG COMPR ESMARK 6X3 LF (GAUZE/BANDAGES/DRESSINGS) ×1 IMPLANT
BNDG ELASTIC 4INX 5YD STR LF (GAUZE/BANDAGES/DRESSINGS) ×1 IMPLANT
BNDG ELASTIC 6INX 5YD STR LF (GAUZE/BANDAGES/DRESSINGS) ×1 IMPLANT
CHLORAPREP W/TINT 26 (MISCELLANEOUS) ×1 IMPLANT
CLSR STERI-STRIP ANTIMIC 1/2X4 (GAUZE/BANDAGES/DRESSINGS) ×1 IMPLANT
COVER BACK TABLE 60X90IN (DRAPES) ×1 IMPLANT
CUFF TRNQT CYL 24X4X16.5-23 (TOURNIQUET CUFF) IMPLANT
CUFF TRNQT CYL 34X4.125X (TOURNIQUET CUFF) IMPLANT
DRAPE EXTREMITY T 121X128X90 (DISPOSABLE) ×1 IMPLANT
DRAPE IMP U-DRAPE 54X76 (DRAPES) ×1 IMPLANT
DRAPE OEC MINIVIEW 54X84 (DRAPES) ×1 IMPLANT
DRAPE U-SHAPE 47X51 STRL (DRAPES) ×1 IMPLANT
DRSG EMULSION OIL 3X3 NADH (GAUZE/BANDAGES/DRESSINGS) ×1 IMPLANT
ELECTRODE REM PT RTRN 9FT ADLT (ELECTROSURGICAL) ×1 IMPLANT
GAUZE PAD ABD 8X10 STRL (GAUZE/BANDAGES/DRESSINGS) ×1 IMPLANT
GAUZE SPONGE 4X4 12PLY STRL (GAUZE/BANDAGES/DRESSINGS) ×1 IMPLANT
GLOVE BIO SURGEON STRL SZ7.5 (GLOVE) ×1 IMPLANT
GLOVE BIOGEL PI IND STRL 7.0 (GLOVE) ×1 IMPLANT
GLOVE BIOGEL PI IND STRL 8 (GLOVE) ×1 IMPLANT
GLOVE SURG SYN 7.0 PF PI (GLOVE) ×1 IMPLANT
GOWN STRL REUS W/ TWL LRG LVL3 (GOWN DISPOSABLE) ×2 IMPLANT
GOWN STRL REUS W/ TWL XL LVL3 (GOWN DISPOSABLE) ×1 IMPLANT
KWIRE FX150X1.25XNS LF SS (WIRE) IMPLANT
NDL HYPO 22X1.5 SAFETY MO (MISCELLANEOUS) IMPLANT
NEEDLE HYPO 22X1.5 SAFETY MO (MISCELLANEOUS) IMPLANT
NS IRRIG 1000ML POUR BTL (IV SOLUTION) ×1 IMPLANT
PACK BASIN DAY SURGERY FS (CUSTOM PROCEDURE TRAY) ×1 IMPLANT
PAD CAST 4YDX4 CTTN HI CHSV (CAST SUPPLIES) ×1 IMPLANT
PADDING CAST ABS COTTON 4X4 ST (CAST SUPPLIES) ×2 IMPLANT
PADDING CAST COTTON 6X4 STRL (CAST SUPPLIES) ×1 IMPLANT
PENCIL SMOKE EVACUATOR (MISCELLANEOUS) ×1 IMPLANT
PLATE BONE 83 1/3 TUB 7H NS (Plate) IMPLANT
SCREW BN T10 FT 14X3.5XSTRDR (Screw) IMPLANT
SCREW BONE 18X3.5 (Screw) IMPLANT
SCREW BONE THRD T10 3.5X12 (Screw) IMPLANT
SCREW CANN 4.0X30 STRL (Screw) IMPLANT
SLEEVE SCD COMPRESS KNEE MED (STOCKING) IMPLANT
SPIKE FLUID TRANSFER (MISCELLANEOUS) IMPLANT
SPLINT PLASTER CAST FAST 5X30 (CAST SUPPLIES) ×20 IMPLANT
SPONGE T-LAP 4X18 ~~LOC~~+RFID (SPONGE) ×1 IMPLANT
SUCTION TUBE FRAZIER 10FR DISP (SUCTIONS) ×1 IMPLANT
SUT ETHILON 3 0 PS 1 (SUTURE) IMPLANT
SUT ETHILON 4 0 PS 2 18 (SUTURE) IMPLANT
SUT MNCRL AB 4-0 PS2 18 (SUTURE) IMPLANT
SUT MON AB 2-0 CT1 36 (SUTURE) IMPLANT
SUT MON AB 3-0 SH27 (SUTURE) IMPLANT
SUT VIC AB 2-0 SH 27XBRD (SUTURE) IMPLANT
SUT VICRYL 0 SH 27 (SUTURE) ×1 IMPLANT
SYNDESMOSIS TIGHTROPE XP (Orthopedic Implant) IMPLANT
SYR BULB EAR ULCER 3OZ GRN STR (SYRINGE) ×1 IMPLANT
SYR CONTROL 10ML LL (SYRINGE) IMPLANT
TOWEL GREEN STERILE FF (TOWEL DISPOSABLE) ×2 IMPLANT
TUBE CONNECTING 20X1/4 (TUBING) ×1 IMPLANT
UNDERPAD 30X36 HEAVY ABSORB (UNDERPADS AND DIAPERS) ×1 IMPLANT

## 2024-07-02 NOTE — Anesthesia Procedure Notes (Signed)
 Procedure Name: LMA Insertion Date/Time: 07/02/2024 11:30 AM  Performed by: Julieanne Fairy BROCKS, CRNAPre-anesthesia Checklist: Patient identified, Emergency Drugs available, Suction available and Patient being monitored Patient Re-evaluated:Patient Re-evaluated prior to induction Oxygen Delivery Method: Circle system utilized Preoxygenation: Pre-oxygenation with 100% oxygen Induction Type: IV induction Ventilation: Mask ventilation without difficulty LMA: LMA inserted LMA Size: 4.0 Number of attempts: 1 Airway Equipment and Method: Bite block Placement Confirmation: positive ETCO2 Tube secured with: Tape Dental Injury: Teeth and Oropharynx as per pre-operative assessment

## 2024-07-02 NOTE — Op Note (Signed)
 07/02/2024  12:23 PM  PATIENT:  Kara Kelley    PRE-OPERATIVE DIAGNOSIS:  displaced bimalleolar fracture of left ankle, Syndesmotic disruption of left ankle  POST-OPERATIVE DIAGNOSIS:  Same  PROCEDURE:  OPEN REDUCTION INTERNAL FIXATION (ORIF) ANKLE FRACTURE  SURGEON:  [name], MD  ASSISTANT: Gerard Large, PA-C, he was present and scrubbed throughout the case, critical for completion in a timely fashion, and for retraction, instrumentation, and closure.   ANESTHESIA:   gen/block   PREOPERATIVE INDICATIONS:  [name] is a  68 y.o. female with a diagnosis of displaced bimalleolar fracture of left ankle, Syndesmotic disruption of left ankle who failed conservative measures and elected for surgical management.    The risks benefits and alternatives were discussed with the patient preoperatively including but not limited to the risks of infection, bleeding, nerve injury, cardiopulmonary complications, the need for revision surgery, among others, and the patient was willing to proceed.  OPERATIVE IMPLANTS: stryker plate,  tightrope  OPERATIVE FINDINGS: Unstable ankle fracture. Stable syndesmosis post op  BLOOD LOSS: min  COMPLICATIONS: none  TOURNIQUET TIME: 45 min  OPERATIVE PROCEDURE:  Patient was identified in the preoperative holding area and site was marked by me He was transported to the operating theater and placed on the table in supine position taking care to pad all bony prominences. After a preincinduction time out anesthesia was induced. The left lower extremity was prepped and draped in normal sterile fashion and a pre-incision timeout was performed. Kara GAILS Dorey received ancef  for preoperative antibiotics.   I made a lateral incision of roughly 7 cm dissection was carried down sharply to the distal fibula and then spreading dissection was used proximally to protect the superficial peroneal nerve. I sharply incised the periosteum and took care to  protect the peroneal tendons. I then debrided the fracture site and performed a reduction maneuver which was held in place with a clamp.   I placed a lag screw across the fracture  I then selected a 7-hole one third tubular plate and placed in a neutralization fashion care was taken distally so as not to penetrate the joint with the cancellus screws.  I then turned my attention medially where I created a 4 cm incision and dissected sharply down to the medial Mal fracture taking care to protect the saphenous vein. I debrided the fracture and reduced and held in place with a tenaculum. I then drilled and placed 1 partially threaded 45 mm cannulated screws one anterior and one posterior across the fracture.  I then stressed the syndesmosis and it was unstable for syndesmotic fixation I performed a reduction maneuver with a and placed a tightrope  The wound was then thoroughly irrigated and closed using a 0 Vicryl and absorbable Monocryl sutures. He was placed in a short leg splint.   POST OPERATIVE PLAN: Non-weightbearing. DVT prophylaxis will consist of mobilization and chemical px

## 2024-07-02 NOTE — Anesthesia Procedure Notes (Signed)
 Anesthesia Regional Block: Adductor canal block   Pre-Anesthetic Checklist: , timeout performed,  Correct Patient, Correct Site, Correct Laterality,  Correct Procedure, Correct Position, site marked,  Risks and benefits discussed,  Surgical consent,  Pre-op evaluation,  At surgeon's request and post-op pain management  Laterality: Left  Prep: chloraprep       Needles:  Injection technique: Single-shot  Needle Type: Echogenic Needle     Needle Length: 9cm  Needle Gauge: 21     Additional Needles:   Narrative:  Start time: 07/02/2024 11:11 AM End time: 07/02/2024 11:14 AM Injection made incrementally with aspirations every 5 mL.  Performed by: Personally  Anesthesiologist: Maryclare Cornet, MD  Additional Notes: Pt tolerated the procedure well.

## 2024-07-02 NOTE — Interval H&P Note (Signed)
 History and Physical Interval Note:  07/02/2024 10:03 AM  Kara Kelley  has presented today for surgery, with the diagnosis of displaced bimalleolar fracture of left ankle, Syndesmotic disruption of left ankle.  The various methods of treatment have been discussed with the patient and family. After consideration of risks, benefits and other options for treatment, the patient has consented to  Procedure(s): OPEN REDUCTION INTERNAL FIXATION (ORIF) ANKLE FRACTURE (Left) as a surgical intervention.  The patient's history has been reviewed, patient examined, no change in status, stable for surgery.  I have reviewed the patient's chart and labs.  Questions were answered to the patient's satisfaction.     [name]

## 2024-07-02 NOTE — Discharge Instructions (Addendum)
 POST-OPERATIVE OPIOID TAPER INSTRUCTIONS: It is important to wean off of your opioid medication as soon as possible. If you do not need pain medication after your surgery it is ok to stop day one. Opioids include: Codeine, Hydrocodone (Norco, Vicodin), Oxycodone (Percocet, oxycontin ) and hydromorphone  amongst others.  Long term and even short term use of opiods can cause: Increased pain response Dependence Constipation Depression Respiratory depression And more.  Withdrawal symptoms can include Flu like symptoms Nausea, vomiting And more Techniques to manage these symptoms Hydrate well Eat regular healthy meals Stay active Use relaxation techniques(deep breathing, meditating, yoga) Do Not substitute Alcohol to help with tapering If you have been on opioids for less than two weeks and do not have pain than it is ok to stop all together.  Plan to wean off of opioids This plan should start within one week post op of your joint replacement. Maintain the same interval or time between taking each dose and first decrease the dose.  Cut the total daily intake of opioids by one tablet each day Next start to increase the time between doses. The last dose that should be eliminated is the evening dose.     No Tylenol  until after 4:20 today if needed  Post Anesthesia Home Care Instructions  Activity: Get plenty of rest for the remainder of the day. A responsible individual must stay with you for 24 hours following the procedure.  For the next 24 hours, DO NOT: -Drive a car -Advertising copywriter -Drink alcoholic beverages -Take any medication unless instructed by your physician -Make any legal decisions or sign important papers.  Meals: Start with liquid foods such as gelatin or soup. Progress to regular foods as tolerated. Avoid greasy, spicy, heavy foods. If nausea and/or vomiting occur, drink only clear liquids until the nausea and/or vomiting subsides. Call your physician if vomiting  continues.  Special Instructions/Symptoms: Your throat may feel dry or sore from the anesthesia or the breathing tube placed in your throat during surgery. If this causes discomfort, gargle with warm salt water. The discomfort should disappear within 24 hours.  If you had a scopolamine  patch placed behind your ear for the management of post- operative nausea and/or vomiting:  1. The medication in the patch is effective for 72 hours, after which it should be removed.  Wrap patch in a tissue and discard in the trash. Wash hands thoroughly with soap and water. 2. You may remove the patch earlier than 72 hours if you experience unpleasant side effects which may include dry mouth, dizziness or visual disturbances. 3. Avoid touching the patch. Wash your hands with soap and water after contact with the patch.  Regional Anesthesia Blocks  1. You may not be able to move or feel the blocked extremity after a regional anesthetic block. This may last may last from 3-48 hours after placement, but it will go away. The length of time depends on the medication injected and your individual response to the medication. As the nerves start to wake up, you may experience tingling as the movement and feeling returns to your extremity. If the numbness and inability to move your extremity has not gone away after 48 hours, please call your surgeon.   2. The extremity that is blocked will need to be protected until the numbness is gone and the strength has returned. Because you cannot feel it, you will need to take extra care to avoid injury. Because it may be weak, you may have difficulty moving it or using it.  You may not know what position it is in without looking at it while the block is in effect.  3. For blocks in the legs and feet, returning to weight bearing and walking needs to be done carefully. You will need to wait until the numbness is entirely gone and the strength has returned. You should be able to move  your leg and foot normally before you try and bear weight or walk. You will need someone to be with you when you first try to ensure you do not fall and possibly risk injury.  4. Bruising and tenderness at the needle site are common side effects and will resolve in a few days.  5. Persistent numbness or new problems with movement should be communicated to the surgeon or the [facility] [phone] [facility] [phone]).        Post Anesthesia Home Care Instructions  Activity: Get plenty of rest for the remainder of the day. A responsible individual must stay with you for 24 hours following the procedure.  For the next 24 hours, DO NOT: -Drive a car -Advertising copywriter -Drink alcoholic beverages -Take any medication unless instructed by your physician -Make any legal decisions or sign important papers.  Meals: Start with liquid foods such as gelatin or soup. Progress to regular foods as tolerated. Avoid greasy, spicy, heavy foods. If nausea and/or vomiting occur, drink only clear liquids until the nausea and/or vomiting subsides. Call your physician if vomiting continues.  Special Instructions/Symptoms: Your throat may feel dry or sore from the anesthesia or the breathing tube placed in your throat during surgery. If this causes discomfort, gargle with warm salt water. The discomfort should disappear within 24 hours.  If you had a scopolamine  patch placed behind your ear for the management of post- operative nausea and/or vomiting:  1. The medication in the patch is effective for 72 hours, after which it should be removed.  Wrap patch in a tissue and discard in the trash. Wash hands thoroughly with soap and water. 2. You may remove the patch earlier than 72 hours if you experience unpleasant side effects which may include dry mouth, dizziness or visual disturbances. 3. Avoid touching the patch. Wash your hands with soap and water after contact with the  patch.

## 2024-07-02 NOTE — Transfer of Care (Signed)
 Immediate Anesthesia Transfer of Care Note  Patient: Kara Kelley  Procedure(s) Performed: OPEN REDUCTION INTERNAL FIXATION (ORIF) ANKLE FRACTURE (Left: Ankle)  Patient Location: PACU  Anesthesia Type:GA combined with regional for post-op pain  Level of Consciousness: awake, alert , and oriented  Airway & Oxygen Therapy: Patient Spontanous Breathing and Patient connected to face mask oxygen  Post-op Assessment: Report given to RN and Post -op Vital signs reviewed and stable  Post vital signs: Reviewed and stable  Last Vitals:  Vitals Value Taken Time  BP    Temp    Pulse 116 07/02/24 13:06  Resp 18 07/02/24 13:06  SpO2 100 % 07/02/24 13:06  Vitals shown include unfiled device data.  Last Pain:  Vitals:   07/02/24 1017  TempSrc: Temporal  PainSc: 10-Worst pain ever      Patients Stated Pain Goal: 8 (07/02/24 1017)  Complications: No notable events documented.

## 2024-07-02 NOTE — Progress Notes (Signed)
 Assisted Dr. Maryclare with left ultrasound guided popliteal and adductor canal block  block. Side rails up, monitors on throughout procedure. See vital signs in flow sheet. Tolerated Procedure well.

## 2024-07-02 NOTE — Anesthesia Postprocedure Evaluation (Signed)
 Anesthesia Post Note  Patient: Kara Kelley  Procedure(s) Performed: OPEN REDUCTION INTERNAL FIXATION (ORIF) ANKLE FRACTURE (Left: Ankle)     Patient location during evaluation: PACU Anesthesia Type: Regional and General Level of consciousness: awake and alert Pain management: pain level controlled Vital Signs Assessment: post-procedure vital signs reviewed and stable Respiratory status: spontaneous breathing, nonlabored ventilation, respiratory function stable and patient connected to nasal cannula oxygen Cardiovascular status: blood pressure returned to baseline and stable Postop Assessment: no apparent nausea or vomiting Anesthetic complications: no   No notable events documented.  Last Vitals:  Vitals:   07/02/24 1330 07/02/24 1350  BP: (!) 125/52 (!) 147/72  Pulse: 99   Resp: 11 14  Temp:  37 C  SpO2: 100% 96%    Last Pain:  Vitals:   07/02/24 1350  TempSrc:   PainSc: 0-No pain                 Azjah Pardo S

## 2024-07-02 NOTE — Anesthesia Procedure Notes (Signed)
 Procedure Name: Intubation Date/Time: 07/02/2024 11:37 AM  Performed by: Julieanne Fairy BROCKS, CRNAPre-anesthesia Checklist: Patient identified, Emergency Drugs available, Suction available and Patient being monitored Patient Re-evaluated:Patient Re-evaluated prior to induction Oxygen Delivery Method: Circle system utilized Preoxygenation: Pre-oxygenation with 100% oxygen Induction Type: IV induction Ventilation: Mask ventilation without difficulty Laryngoscope Size: Mac and 3 Grade View: Grade II Tube type: Oral Tube size: 7.0 mm Number of attempts: 1 Airway Equipment and Method: Stylet and Oral airway Placement Confirmation: ETT inserted through vocal cords under direct vision, positive ETCO2 and breath sounds checked- equal and bilateral Secured at: 21 cm Tube secured with: Tape Dental Injury: Teeth and Oropharynx as per pre-operative assessment

## 2024-07-02 NOTE — Anesthesia Procedure Notes (Signed)
 Anesthesia Regional Block: Popliteal block   Pre-Anesthetic Checklist: , timeout performed,  Correct Patient, Correct Site, Correct Laterality,  Correct Procedure, Correct Position, site marked,  Risks and benefits discussed,  Surgical consent,  Pre-op evaluation,  At surgeon's request and post-op pain management  Laterality: Left  Prep: chloraprep       Needles:  Injection technique: Single-shot  Needle Type: Echogenic Stimulator Needle          Additional Needles:   Procedures:, nerve stimulator,,,,,     Nerve Stimulator or Paresthesia:  Response: plantar flexion of foot, 0.45 mA  Additional Responses:   Narrative:  Start time: 07/02/2024 11:01 AM End time: 07/02/2024 11:10 AM Injection made incrementally with aspirations every 5 mL.  Performed by: Personally  Anesthesiologist: Maryclare Cornet, MD  Additional Notes: Functioning IV was confirmed and monitors were applied.  A 90mm 21ga Arrow echogenic stimulator needle was used. Sterile prep and drape,hand hygiene and sterile gloves were used.  Negative aspiration and negative test dose prior to incremental administration of local anesthetic. The patient tolerated the procedure well.  Ultrasound guidance: relevent anatomy identified, needle position confirmed, local anesthetic spread visualized around nerve(s), vascular puncture avoided.  Image printed for medical record.

## 2024-07-03 ENCOUNTER — Encounter (HOSPITAL_BASED_OUTPATIENT_CLINIC_OR_DEPARTMENT_OTHER): Payer: Self-pay | Admitting: Orthopedic Surgery

## 2024-07-03 ENCOUNTER — Telehealth: Payer: Self-pay

## 2024-07-03 NOTE — Telephone Encounter (Signed)
 Patient was identified as falling into the [place] Measure - Diabetes.   Patient was: Patient is not currently using our practice.

## 2024-07-04 ENCOUNTER — Telehealth: Payer: Self-pay

## 2024-07-04 ENCOUNTER — Other Ambulatory Visit

## 2024-07-04 NOTE — Progress Notes (Deleted)
 07/04/2024 Name: Kara Kelley MRN: [id number] DOB: June 14, 1956  No chief complaint on file.   Kara Kelley is a 68 y.o. year old female who presented for a telephone visit.   They were referred to the pharmacist by their PCP for assistance in managing diabetes and polypharmacy. PMH includes HTN, COPD, hepatitis C, GERD, T2DM with retinopathy, osteoporosis, CKD3, HLD.    Subjective: Patient was last seen by PCP, Libby Blanch, DO, on 06/05/24. At last visit, BP was controlled on losartan -hydrochlorothiazide. Her last A1C was 8.1% in May, increased from 7.4%. Previously, she was transitioned from Ozempic  1 mg to Mounjaro  2.5 mg weekly, but at her most recent visit she reported not tolerating Mounjaro  and was instructed to restart Ozempic  at 0.25 mg weekly. She was instructed to continue Tresiba , Humalog , and metformin  as previously prescribed. She was outreached by pharmacy via telephone on 04/19/24, but outreach was unsuccessful.   Metformin  right at renal cutoff for TDD of 1000 > consider dose reduction after next BMP if still < 45.  Bring in person for A1C ? Or wait til she has been on Ozempic  longer?  Today, patient reports   Care Team: Primary Care Provider: Blanch Libby, DO ; Next Scheduled Visit: needs to be scheduled ~Nov-Dec 2025   Medication Access/Adherence  Current Pharmacy:  [facility] [phone] - [place], [place] - [address] AT [facility] [address] [place] [address] Phone: [phone] Fax: [phone]   Patient reports affordability concerns with their medications: {YES/NO:21197} Patient reports access/transportation concerns to their pharmacy: {YES/NO:21197} Patient reports adherence concerns with their medications:  {YES/NO:21197} ***  *** Patient denies adherence with medications, reports missing *** medications *** times per week, on average.   Diabetes:  Current medications: Ozempic  0.25 mg weekly (has  not started, Mounjaro  last dispensed on 05/28/24 for 28 ds), Tresiba  15 units daily, Humalog  5 units TID with meals, metformin  XR 1000 mg BID Medications tried in the past: Mounjaro  - GI AE***,   Current glucose readings: *** Using *** meter; testing *** times daily  Date of Download: *** % Time CGM is active: ***% Average Glucose: *** mg/dL Glucose Management Indicator: ***  Glucose Variability: *** (goal <36%) Time in Goal:  - Time in range 70-180: ***% - Time above range: ***% - Time below range: ***% Observed patterns:  Patient {Actions; denies-reports:120008} hypoglycemic s/sx including ***dizziness, shakiness, sweating. Patient {Actions; denies-reports:120008} hyperglycemic symptoms including ***polyuria, polydipsia, polyphagia, nocturia, neuropathy, blurred vision.  Current meal patterns:  - Breakfast: *** - Lunch *** - Supper *** - Snacks *** - Drinks ***  Current physical activity: ***  Current medication access support: ***  Hypertension:  Current medications: *** Medications previously tried:   Patient {HAS/DOES NOT YJCZ:65250} a validated, automated, upper arm home BP cuff Current blood pressure readings readings: ***  Patient {Actions; denies-reports:120008} hypotensive s/sx including ***dizziness, lightheadedness.  Patient {Actions; denies-reports:120008} hypertensive symptoms including ***headache, chest pain, shortness of breath  Current meal patterns: ***  Current physical activity: ***   Hyperlipidemia/ASCVD Risk Reduction  Current lipid lowering medications:  Medications tried in the past:   Antiplatelet regimen:   ASCVD History:  Family History:  Risk Factors:   Current physical activity: ***  Current medication access support: ***  Clinical ASCVD: No  The 10-year ASCVD risk score (Arnett DK, et al., 2019) is: 26.6%   Values used to calculate the score:     Age: 53 years  Clincally relevant sex: Female     Is Non-Hispanic African  American: Yes     Diabetic: Yes     Tobacco smoker: No     Systolic Blood Pressure: 147 mmHg     Is BP treated: Yes     HDL Cholesterol: 58 mg/dL     Total Cholesterol: 184 mg/dL    Objective:  BP Readings from Last 3 Encounters:  07/02/24 (!) 147/72  06/21/24 118/67  06/19/24 115/75    Lab Results  Component Value Date   HGBA1C 8.6 (A) 06/19/2024   HGBA1C 8.1 (A) 02/27/2024   HGBA1C 7.4 (A) 11/01/2023       Latest Ref Rng & Units 06/21/2024    3:11 PM 06/21/2024    2:46 PM 04/04/2024   11:06 AM  BMP  Glucose 70 - 99 mg/dL 863  864  884   BUN 8 - 23 mg/dL 11  10  19    Creatinine 0.44 - 1.00 mg/dL 8.59  8.64  8.66   BUN/Creat Ratio 12 - 28   14   Sodium 135 - 145 mmol/L 137  134  143   Potassium 3.5 - 5.1 mmol/L 3.7  3.6  3.5   Chloride 98 - 111 mmol/L 100  98  105   CO2 22 - 32 mmol/L  22  18   Calcium  8.9 - 10.3 mg/dL  9.3  9.0     Lab Results  Component Value Date   CHOL 184 06/19/2024   HDL 58 06/19/2024   LDLCALC 111 (H) 06/19/2024   TRIG 84 06/19/2024   CHOLHDL 3.2 06/19/2024    Medications Reviewed Today   Medications were not reviewed in this encounter       Assessment/Plan:   Metformin  right at renal cutoff for TDD of 1000 > consider dose reduction after next BMP if still < 45.  Bring in person for A1C ? Or wait til she has been on Ozempic  longer?  Diabetes: - Currently {CHL Controlled/Uncontrolled:[phone]} with most recent A1C of *** {Desc; above/below:16086} goal {CCM A1c HNJOD:[id number]}. Medication adherence appears ***. Control is suboptimal due to ***.   - Last UACR *** - Patient denies personal or family history of multiple endocrine neoplasia type 2, medullary thyroid  cancer; personal history of pancreatitis or gallbladder disease.*** - Reviewed long term cardiovascular and renal outcomes of uncontrolled blood sugar - Reviewed goal A1c, goal fasting, and goal 2 hour post prandial glucose - Reviewed hypoglycemia management plan and the  rule of 15*** - Reviewed dietary modifications including *** utilizing the healthy plate method, limiting portion size of carbohydrate foods, increasing intake of protein and non-starchy vegetables. Counseled patient to stay hydrated with water throughout the day. - Reviewed lifestyle modifications including: aiming for 150 minutes of moderate intensity exercise every week. *** - Recommend to ***  - Recommend to check glucose twice daily: fasting and 2-hr PPG ***. Counseled patient to bring glucometer or BG log to every appointment. - Next A1C due ***    Meets financial criteria for *** patient assistance program through ***. Will collaborate with provider, CPhT, and patient to pursue assistance.     Hypertension: - Currently {CHL Controlled/Uncontrolled:[phone]} with *** BP consistently {Desc; above/below:16086} goal {BP Goal:27557}. Patient {ACTION; IS/IS WNU:78978602} having s/sx of hypo- or hyper-tension. Medication adherence appears {Desc; appropriate/inappropriate:30686}. Control is suboptimal due to ***.  - Reviewed long term cardiovascular and renal outcomes of uncontrolled blood pressure - Reviewed appropriate blood pressure monitoring technique and reviewed goal blood pressure.  Recommended to check home blood pressure and heart rate *** once daily and keep a log to bring to upcoming appointments - Recommend to ***     Hyperlipidemia/ASCVD Risk Reduction: - Currently {CHL Controlled/Uncontrolled:[phone]} with most recent LDL-C of *** {Desc; above/below:16086} goal {LDL Goals:32982} given ***. *** intensity statin indicated. - Reviewed long term complications of uncontrolled cholesterol - Reviewed lifestyle recommendations to lower LDL-C including regular physical activity, 5-10% weight loss, eating a diet low in saturated fat, and increasing intake of fiber to at least 25 g per day. - Reviewed lifestyle recommendations to lower TG including following a low-carb diet, restricting  alcohol intake, and increasing physical activity and exercise - Recommend to ***     Written patient instructions provided. Patient verbalized understanding of treatment plan.   Follow Up Plan: *** Pharmacist *** PCP clinic visit needs to be scheduled  Lorain Baseman, PharmD [facility] [phone]

## 2024-07-04 NOTE — Telephone Encounter (Signed)
 Attempted to contact patient for scheduled appointment for medication management. Left HIPAA compliant message for patient to return my call at their convenience.   Of note, patient had a fall on 06/21/24 resulting in L ankle fracture. She had surgery on 07/02/24. Assume she may be recovering and unable to complete appt today. Will tentatively reschedule for next week, unless I hear from patient.   Lorain Baseman, PharmD [facility] [phone]

## 2024-07-08 ENCOUNTER — Telehealth: Payer: Self-pay

## 2024-07-08 ENCOUNTER — Other Ambulatory Visit

## 2024-07-08 ENCOUNTER — Encounter: Payer: Self-pay | Admitting: Student

## 2024-07-08 DIAGNOSIS — E1165 Type 2 diabetes mellitus with hyperglycemia: Secondary | ICD-10-CM

## 2024-07-08 DIAGNOSIS — Z794 Long term (current) use of insulin: Secondary | ICD-10-CM

## 2024-07-08 DIAGNOSIS — N1832 Chronic kidney disease, stage 3b: Secondary | ICD-10-CM

## 2024-07-08 NOTE — Progress Notes (Signed)
 07/08/2024 Name: Kara Kelley MRN: [id number] DOB: 01-09-1956  Chief Complaint  Patient presents with   Diabetes    Kara Kelley is a 67 y.o. year old female who presented for a telephone visit.   They were referred to the pharmacist by their PCP for assistance in managing diabetes and polypharmacy. PMH includes HTN, COPD, hepatitis C, GERD, T2DM with retinopathy, osteoporosis, CKD3, HLD.    Subjective: Patient was seen by PCP, Libby Blanch, DO, on 06/05/24. At last visit, BP was controlled on losartan -hydrochlorothiazide. Her last A1C was 8.1% in May, increased from 7.4%. Previously, she was transitioned from Ozempic  1 mg to Mounjaro  2.5 mg weekly, but at her most recent visit she reported not tolerating Mounjaro  and was instructed to restart Ozempic  at 0.25 mg weekly. She was instructed to continue Tresiba , Humalog , and metformin  as previously prescribed. She was outreached by pharmacy via telephone on 04/19/24, but outreach was unsuccessful. Her pharmacy appt has been rescheduled multiple times. She was seen by Dr. Harrie on 06/19/24. A1C increased to 8.6%. She was instructed to titrate Ozempic . eGFR has remained < 45 mL/min on past 2 BMPs. She had a fall on 06/21/24 with L ankle fracture, and underwent surgery on 07/02/24.  Today, patient reports she is not doing well. She is recovering from her surgery and is in a lot of pain. Her daughter is not home, and she reports that her daughter helps manage her medications. When I spoke with her daughter earlier, she preferred that I call Ms. Fehrman for the appointment. While we were unable to review her full medication list, she was agreeable to review her diabetes medications.    Care Team: Primary Care Provider: Blanch Libby, DO ; Next Scheduled Visit: needs to be scheduled ~Nov-Dec 2025   Medication Access/Adherence  Current Pharmacy:  [facility] [phone] - [place] - [address] AT [facility] [address] [place] [address] Phone: [phone] Fax: [phone]   Patient reports affordability concerns with their medications: No  Patient reports access/transportation concerns to their pharmacy: Yes  - Daughter usually takes her to [facility] to pick up her medications Patient reports adherence concerns with their medications:  Yes  - patient unable to recall insulin  doses, reports her daughter [name]) usually helps give her the medications. Reports she has not started Ozempic  yet.   Diabetes:  Current medications: Ozempic  0.25 mg weekly (has not started - reports she can start today, it is in her fridge at home), Tresiba  15 units BID (states she takes 20 units daily), Humalog  5 units TID with meals (reports she is taking), metformin  XR 1000 mg BID (reports she is taking one tablet with breakfast, then states she actually is taking 2 tablets twice a day), Jardiance  10 mg daily  Medications tried in the past: Mounjaro  - GI AE  Current glucose readings: Reports that she is not currently checking. Has One Touch meter at home. Also has Freestyle Libre sensors that she has not set up yet. Reports she has an Android phone, and she is going to see if her daughter can help her set these up.   Patient denies hypoglycemic s/sx including dizziness, shakiness, sweating. Patient denies hyperglycemic symptoms including polyuria, polydipsia, polyphagia, nocturia, neuropathy, blurred vision.  Current meal patterns: did not discuss in depth today  Current physical activity: limited by ankle fracture currently (cannot walk at all) - has f/u on 07/12/24  Current medication access support:  [employer] Dual Complete  Hypertension:  Current medications:  losartan -hydrochlorothiazide 100-25 mg daily  Hyperlipidemia/ASCVD Risk Reduction  Current lipid lowering medications: atorvastatin  10 mg daily  Antiplatelet regimen: aspirin  81 mg daily  ASCVD History: none known Family History:  heart disease in mother Risk Factors: HTN, T2DM, LDL > 100 mg/dL, former smoker  Clinical ASCVD: No  The 10-year ASCVD risk score (Arnett DK, et al., 2019) is: 26.6%   Values used to calculate the score:     Age: 61 years     Clincally relevant sex: Female     Is Non-Hispanic African American: Yes     Diabetic: Yes     Tobacco smoker: No     Systolic Blood Pressure: 147 mmHg     Is BP treated: Yes     HDL Cholesterol: 58 mg/dL     Total Cholesterol: 184 mg/dL    Objective:  BP Readings from Last 3 Encounters:  07/02/24 (!) 147/72  06/21/24 118/67  06/19/24 115/75    Lab Results  Component Value Date   HGBA1C 8.6 (A) 06/19/2024   HGBA1C 8.1 (A) 02/27/2024   HGBA1C 7.4 (A) 11/01/2023       Latest Ref Rng & Units 06/21/2024    3:11 PM 06/21/2024    2:46 PM 04/04/2024   11:06 AM  BMP  Glucose 70 - 99 mg/dL 863  864  884   BUN 8 - 23 mg/dL 11  10  19    Creatinine 0.44 - 1.00 mg/dL 8.59  8.64  8.66   BUN/Creat Ratio 12 - 28   14   Sodium 135 - 145 mmol/L 137  134  143   Potassium 3.5 - 5.1 mmol/L 3.7  3.6  3.5   Chloride 98 - 111 mmol/L 100  98  105   CO2 22 - 32 mmol/L  22  18   Calcium  8.9 - 10.3 mg/dL  9.3  9.0     Lab Results  Component Value Date   CHOL 184 06/19/2024   HDL 58 06/19/2024   LDLCALC 111 (H) 06/19/2024   TRIG 84 06/19/2024   CHOLHDL 3.2 06/19/2024    Medications Reviewed Today     Reviewed by Brinda Lorain SQUIBB, RPH (Pharmacist) on 07/08/24 at 1544  Med List Status: <None>   Medication Order Taking? Sig Documenting Provider Last Dose Status Informant  Accu-Chek FastClix Lancets MISC [id number]  Use as directed to check blood sugars 2 times per day dx: e11.65 Tobie Gaines, DO  Active   Accu-Chek Softclix Lancets lancets [id number]  Use as instructed to check blood sugars twice daily E11.69 Tobie Gaines, DO  Active   albuterol  (VENTOLIN  HFA) 108 (90 Base) MCG/ACT inhaler [id number]  Inhale 1-2 puffs into the lungs every 6 (six) hours as needed for  wheezing or shortness of breath. Kara Dorn NOVAK, MD  Active   alendronate  (FOSAMAX ) 10 MG tablet [id number]  Take 1 tablet (10 mg total) by mouth daily before breakfast. Take with a full glass of water on an empty stomach. Tobie Gaines, DO  Active   aspirin  EC 81 MG tablet [id number]  Take 1 tablet (81 mg total) by mouth 2 (two) times daily. To prevent blood clots for 30 days after surgery. Gawne, Meghan M, PA-C  Active   atorvastatin  (LIPITOR) 10 MG tablet [id number]  Take 1 tablet (10 mg total) by mouth daily. Tobie Gaines, DO  Active   Blood Glucose Monitoring Suppl [facility] VERIO REFLECT) w/Device KIT [id number]  Use to  check blood sugar up to 3 times a day Atway, Rayann N, DO  Active   buPROPion  (WELLBUTRIN  XL) 300 MG 24 hr tablet [id number]  Take 1 tablet (300 mg total) by mouth in the morning and at bedtime. Tobie Gaines, DO  Active   Continuous Glucose Receiver (FREESTYLE LIBRE 3 READER) DEVI [id number]  Use to check your blood sugar continuously.  Patient not taking: Reported on 07/08/2024   Elicia Sharper, DO  Active   Continuous Glucose Sensor (FREESTYLE LIBRE 3 PLUS SENSOR) [place] [id number]  Change sensor every 15 days. Use to check your blood sugar continuously.  Patient not taking: Reported on 07/08/2024   Zheng, Michael, DO  Active   diclofenac  Sodium (VOLTAREN ) 1 % GEL [id number]  Apply 4 g topically 4 (four) times daily. Tawkaliyar, Roya, DO  Active   DULoxetine  (CYMBALTA ) 60 MG capsule [id number]  Take 1 capsule (60 mg total) by mouth daily. Tobie Gaines, DO  Active   empagliflozin  (JARDIANCE ) 10 MG TABS tablet [id number] Yes Take 1 tablet (10 mg total) by mouth daily. Tobie Gaines, DO  Active   fluconazole  (DIFLUCAN ) 150 MG tablet [id number]  Take 1 tablet (150 mg total) by mouth daily. Azadegan, Maryam, MD  Active   fluticasone -salmeterol (ADVAIR) 250-50 MCG/ACT AEPB [id number]  INHALE 1 PUFF BY MOUTH INTO LUNGS TWICE DAILY IN THE MORNING AND AT BEDTIME Kara Dorn NOVAK, MD  Active   glucose  blood (ONETOUCH ULTRA) test strip [id number]  TEST AS DIRECTED UPTO 3 TIMES DAILY Tobie Gaines, DO  Active   insulin  lispro (HUMALOG  KWIKPEN) 100 UNIT/ML KwikPen [id number] Yes ADMINISTER 5 UNITS UNDER THE SKIN THREE TIMES DAILY WITH MEALS Tobie Gaines, DO  Active   Insulin  Pen Needle (PEN NEEDLES) 32G X 4 MM MISC [id number]  Use new pen needle for each insulin  injection up to 4 times a day. Elicia Sharper, DO  Active   Iron , Ferrous Sulfate , 325 (65 Fe) MG TABS [id number]  Take 325 mg by mouth daily. Tobie Gaines, DO  Active   Lancet Device MISC [id number]  1 each by Does not apply route in the morning, at noon, and at bedtime. May substitute to any manufacturer covered by patient's insurance. Lorren Greig PARAS, NP  Active   lidocaine  (LIDODERM ) 5 % [id number]  Place 1 patch onto the skin daily. Remove & Discard patch within 12 hours or as directed by MD Heddy Barren, DO  Active   LINZESS  145 MCG CAPS capsule [id number]  Take 145 mcg by mouth daily before breakfast.  [provider]  Active Self  losartan -hydrochlorothiazide (HYZAAR) 100-25 MG tablet [id number]  Take 1 tablet by mouth daily. Tobie Gaines, DO  Active   meloxicam  (MOBIC ) 15 MG tablet [id number]  Take 1 tablet (15 mg total) by mouth daily as needed for pain (and inflammation). Gawne, Meghan M, PA-C  Active   metFORMIN  (GLUCOPHAGE -XR) 500 MG 24 hr tablet [id number] Yes Take 2 tablets (1,000 mg total) by mouth 2 (two) times daily with a meal. Tobie Gaines, DO  Active   mineral oil-hydrophilic petrolatum  (AQUAPHOR) ointment [id number]  Apply topically as needed for dry skin. Azadegan, Maryam, MD  Active   Multiple Vitamins-Minerals (MULTIVITAMIN WITH MINERALS) tablet [id number]  Take 1 tablet by mouth daily. Alive [provider]  Active Self  omeprazole  (PRILOSEC) 40 MG capsule [id number]  TAKE 1 CAPSULE(40 MG) BY MOUTH DAILY BEFORE BREAKFAST Tobie Gaines, DO  Active   ondansetron  (ZOFRAN -ODT) 4 MG disintegrating tablet [id number]  Take 1  tablet (  4 mg total) by mouth every 8 (eight) hours as needed for nausea or vomiting. Gawne, Meghan M, PA-C  Active   oxyCODONE  (ROXICODONE ) 5 MG immediate release tablet [id number]  Take 1 tablet (5 mg total) by mouth 2 (two) times daily as needed for severe pain (pain score 7-10) or breakthrough pain. in the ankle after surgery for a fracture that is not resolved by first taking your normal daily dose of Hydrocodone  for chronic pain Gawne, Meghan M, PA-C  Active   oxyCODONE -acetaminophen  (PERCOCET/ROXICET) 5-325 MG tablet [id number]  Take 1 tablet by mouth every 6 (six) hours as needed for severe pain (pain score 7-10). Steinl, Kevin, MD  Active   polyethylene glycol powder (GLYCOLAX /MIRALAX ) 17 GM/SCOOP powder [id number]  Take 17 g by mouth 2 (two) times daily. Until daily soft stools OTC Vicky Charleston, PA-C  Active   Semaglutide , 1 MG/DOSE, 4 MG/3ML SOPN [id number]  Inject 1 mg into the skin once a week for 4 doses. Harrie Bruckner, DO  Active   Semaglutide , 2 MG/DOSE, (OZEMPIC , 2 MG/DOSE,) 8 MG/3ML SOPN [id number]  Inject 2 mg into the skin once a week. Harrie Bruckner, DO  Active   Semaglutide ,0.25 or 0.5MG /DOS, 2 MG/3ML SOPN [id number]  Inject 0.5 mg into the skin once a week for 4 doses.  Patient not taking: Reported on 07/08/2024   Harrie Bruckner, DO  Active   traZODone  (DESYREL ) 50 MG tablet [id number]  Take 1 tablet (50 mg total) by mouth at bedtime as needed for sleep. Tobie Gaines, DO  Active   TRESIBA  FLEXTOUCH 100 UNIT/ML FlexTouch Pen [id number] Yes ADMINISTER 15 UNITS UNDER THE SKIN TWICE DAILY  Patient taking differently: No sig reported   Tobie Gaines, DO  Active               Assessment/Plan:   Diabetes: - Currently uncontrolled with most recent A1C of 8.6% above goal <7% and worsened from 8.1% in May 2025 in the setting of discontinuing Mounjaro  due to GI AE. Patient reports she has not restarted Ozempic  yet, but she can do this today. Patient is not currently  monitoring BG, advised her that I would send instructions via MyChart for her daughter to reference to set up her CGM.  - Last UACR Sept 2025 - 23 mg/g - Reviewed long term cardiovascular and renal outcomes of uncontrolled blood sugar - Reviewed goal A1c, goal fasting, and goal 2 hour post prandial glucose - Reviewed hypoglycemia management plan and the rule of 15 - Recommend to continue Tresiba  at current dose (written as 15 units BID, but patient reports she is taking 20 units daily - will clarify when I am able to speak with her daughter)  - Recommend to continue Humalog  5 units TID with meals - Recommend to START Ozempic  0.25 mg x 4 weeks then increase to 0.5 mg weekly as previously instructed - Recommend to continue metformin  XR 1000 mg BID. Patient is at cusp of needing dose reduction to TDD of 1000 mg/d with eGFR ~45 mL/min.  - Recommend to continue Jardiance  10 mg daily. Recommend increasing to 25 mg daily once glycemic control improves.  - Recommend to check glucose continuously using CGM - will send instructions via MyChart today - Next A1C due Dec 2025  - Noted that patient is overdue for eye exam. She requested new referral to ophthalmology. Collaborated with provider to place today.     Hypertension: - Currently controlled with clinic BP consistently below goal less than 130/80. - Recommend to  continue losartan -hydrochlorothiazide 100-25 mg daily     Hyperlipidemia/ASCVD Risk Reduction: - Currently uncontrolled with most recent LDL-C of 111 mg/dL above goal < 70 mg/dL. Patient is prescribed moderate intensity statin. Appears she restarted after period of nonadherence in May. Should consider escalating to high intensity statin at follow-up, given presence of multiple cardiac risk factors.  - Recommend to continue atorvastatin  10 mg daily   Written patient instructions provided. Patient verbalized understanding of treatment plan.   Follow Up Plan:  Pharmacist telephone  07/29/24 PCP clinic visit needs to be scheduled  Lorain Baseman, PharmD [facility] [phone]

## 2024-07-08 NOTE — Telephone Encounter (Signed)
 Attempted to contact patient for scheduled appointment for medication management. Left HIPAA compliant message for patient to return my call at their convenience.   Spoke with patient's daughter, Alnisa, who reported she is staying with Ms. Sposito after her surgery. Reported pt is currently asleep, but would likely be available for telephone call this afternoon. Will plan to outreach around 2 PM.   Lorain Baseman, PharmD [facility] [phone]

## 2024-07-09 ENCOUNTER — Telehealth: Payer: Self-pay | Admitting: *Deleted

## 2024-07-09 DIAGNOSIS — S82892A Other fracture of left lower leg, initial encounter for closed fracture: Secondary | ICD-10-CM

## 2024-07-09 NOTE — Telephone Encounter (Signed)
 Return call from [place] OT[facility] - stated he did a screening on Thursday. Stated pt has difficulty getting up and moving, even standing .Requesting referral for  Home OT; stated pt and family need training.

## 2024-07-09 NOTE — Telephone Encounter (Signed)
 Copied from CRM #[id number]. Topic: Clinical - Home Health Verbal Orders >> Jul 09, 2024  2:55 PM Miquel SAILOR wrote: Caller/Agency: Selinda from [facility] Number: [phone] Service Requested: requesting Occupational Therapy Frequency: n/a Any new concerns about the patient? No

## 2024-07-09 NOTE — Telephone Encounter (Signed)
 Selinda returning call, transferred to clinic.

## 2024-07-09 NOTE — Telephone Encounter (Signed)
 Return call to [place] with [facility]); no answer, left message of office's return call.

## 2024-07-12 DIAGNOSIS — Z23 Encounter for immunization: Secondary | ICD-10-CM | POA: Diagnosis not present

## 2024-07-12 DIAGNOSIS — E559 Vitamin D deficiency, unspecified: Secondary | ICD-10-CM | POA: Diagnosis not present

## 2024-07-12 DIAGNOSIS — Z9889 Other specified postprocedural states: Secondary | ICD-10-CM | POA: Diagnosis not present

## 2024-07-12 DIAGNOSIS — S82842D Displaced bimalleolar fracture of left lower leg, subsequent encounter for closed fracture with routine healing: Secondary | ICD-10-CM | POA: Diagnosis not present

## 2024-07-12 DIAGNOSIS — Z794 Long term (current) use of insulin: Secondary | ICD-10-CM | POA: Diagnosis not present

## 2024-07-12 DIAGNOSIS — G8929 Other chronic pain: Secondary | ICD-10-CM | POA: Diagnosis not present

## 2024-07-12 DIAGNOSIS — Z79899 Other long term (current) drug therapy: Secondary | ICD-10-CM | POA: Diagnosis not present

## 2024-07-12 DIAGNOSIS — M545 Low back pain, unspecified: Secondary | ICD-10-CM | POA: Diagnosis not present

## 2024-07-12 DIAGNOSIS — M129 Arthropathy, unspecified: Secondary | ICD-10-CM | POA: Diagnosis not present

## 2024-07-12 DIAGNOSIS — E1122 Type 2 diabetes mellitus with diabetic chronic kidney disease: Secondary | ICD-10-CM | POA: Diagnosis not present

## 2024-07-12 DIAGNOSIS — K5903 Drug induced constipation: Secondary | ICD-10-CM | POA: Diagnosis not present

## 2024-07-12 DIAGNOSIS — E7800 Pure hypercholesterolemia, unspecified: Secondary | ICD-10-CM | POA: Diagnosis not present

## 2024-07-12 DIAGNOSIS — Z8781 Personal history of (healed) traumatic fracture: Secondary | ICD-10-CM | POA: Diagnosis not present

## 2024-07-12 DIAGNOSIS — N1831 Chronic kidney disease, stage 3a: Secondary | ICD-10-CM | POA: Diagnosis not present

## 2024-07-16 DIAGNOSIS — Z79899 Other long term (current) drug therapy: Secondary | ICD-10-CM | POA: Diagnosis not present

## 2024-07-17 ENCOUNTER — Ambulatory Visit: Payer: Self-pay | Admitting: Student

## 2024-07-17 ENCOUNTER — Ambulatory Visit: Payer: Self-pay

## 2024-07-17 NOTE — Telephone Encounter (Signed)
 FYI Only or Action Required?: Action required by provider: request for appointment.  Patient was last seen in primary care on 06/19/2024 by Kara Bruckner, DO.  Called Nurse Triage reporting Anxiety.  Symptoms began several days ago.  Interventions attempted: Nothing.  Symptoms are: gradually worsening.  Triage Disposition: See Physician Within 24 Hours  Patient/caregiver understands and will follow disposition?: YesCopied from CRM #[id number]. Topic: Clinical - Red Word Triage >> Jul 17, 2024 12:05 PM Kara Kelley wrote: Red Word that prompted transfer to Nurse Triage: Anxiety concerns Reason for Disposition  Patient sounds very upset or troubled to the triager  Answer Assessment - Initial Assessment Questions Pt has history of anxiety but hasn't taken any medication recently. Pt lost 2 family members 2 days ago. Pt needs virtual appt due to broken left ankle.        1. CONCERN: Did anything happen that prompted you to call today?      Family members killed 2 days ago 2. ANXIETY SYMPTOMS: Can you describe how you (your loved one; patient) have been feeling? (e.g., tense, restless, panicky, anxious, keyed up, overwhelmed, sense of impending doom).      Anxious/jittery; 3. ONSET: How long have you been feeling this way? (e.g., hours, days, weeks)     2 days 4. SEVERITY: How would you rate the level of anxiety? (e.g., 0 - 10; or mild, moderate, severe).     severe 5. FUNCTIONAL IMPAIRMENT: How have these feelings affected your ability to do daily activities? Have you had more difficulty than usual doing your normal daily activities? (e.g., getting better, same, worse; self-care, school, work, interactions)     yes 6. HISTORY: Have you felt this way before? Have you ever been diagnosed with an anxiety problem in the past? (e.g., generalized anxiety disorder, panic attacks, PTSD). If Yes, ask: How was this problem treated? (e.g., medicines, counseling, etc.)      yes 7. RISK OF HARM - SUICIDAL IDEATION: Do you ever have thoughts of hurting or killing yourself? If Yes, ask:  Do you have these feelings now? Do you have a plan on how you would do this?    no 8. TREATMENT:  What has been done so far to treat this anxiety? (e.g., medicines, relaxation strategies). What has helped?     na 9. THERAPIST: Do you have a counselor or therapist? If Yes, ask: What is their name?     na 10. POTENTIAL TRIGGERS: Do you drink caffeinated beverages (e.g., coffee, colas, teas), and how much daily? Do you drink alcohol or use any drugs? Have you started any new medicines recently?       Death of family  57. PATIENT SUPPORT: Who is with you now? Who do you live with? Do you have family or friends who you can talk to?        Family  12. OTHER SYMPTOMS: Do you have any other symptoms? (e.g., feeling depressed, trouble concentrating, trouble sleeping, trouble breathing, palpitations or fast heartbeat, chest pain, sweating, nausea, or diarrhea) menstrual period?       Headache  Protocols used: Anxiety and Panic Attack-A-AH

## 2024-07-17 NOTE — Telephone Encounter (Signed)
 Called pt - call was disconnected. Called pt back - no answer.

## 2024-07-17 NOTE — Telephone Encounter (Signed)
 Called pt back - no answer; left message of office's return call. Pt had an appt this afternoon which was cancelled.

## 2024-07-22 ENCOUNTER — Ambulatory Visit: Payer: Self-pay | Admitting: *Deleted

## 2024-07-22 ENCOUNTER — Telehealth: Payer: Self-pay

## 2024-07-22 NOTE — Telephone Encounter (Signed)
 Pt stated has nausea over 1 week; she cannot keep food down , only water. I asked if she tried Zofran -ODTl; stated it makes the nausea worse. She has the first available appt - Wed 10/15 with Dr Tobie. Advised pt to go to ER if her symptoms become worse.

## 2024-07-22 NOTE — Telephone Encounter (Signed)
 ERROR

## 2024-07-22 NOTE — Telephone Encounter (Signed)
 FYI Only or Action Required?: FYI only for provider.  Patient was last seen in primary care on 06/19/2024 by Harrie Bruckner, DO.  Called Nurse Triage reporting Nausea.  Symptoms began several weeks ago.  Interventions attempted: Prescription medications: Zofran .  Symptoms are: unchanged.  Triage Disposition: See Physician Within 24 Hours  Patient/caregiver understands and will follow disposition?: Yes  Patient states she has broken ankle and has to arrange transportation- she is requesting change in her medication for nausea.  Reason for Disposition  [1] MILD or MODERATE vomiting AND [2] present > 48 hours (2 days)  (Exception: Mild vomiting with associated diarrhea.)  Answer Assessment - Initial Assessment Questions 1. NAUSEA SEVERITY: How bad is the nausea? (e.g., mild, moderate, severe; dehydration, weight loss)     Loss of appetite, possible weight loss 2. ONSET: When did the nausea begin?     Over 1 week 3. VOMITING: Any vomiting? If Yes, ask: How many times today?     Yes- not a lot- not able to hold food down, not every day 4. RECURRENT SYMPTOM: Have you had nausea before? If Yes, ask: When was the last time? What happened that time?     Not like this- always had stomach problems- patient states it sometimes gets worse 5. CAUSE: What do you think is causing the nausea?     Unsure- medication not helping- akes it worse.  Answer Assessment - Initial Assessment Questions 1. VOMITING SEVERITY: How many times have you vomited in the past 24 hours?      yesterday 2. ONSET: When did the vomiting begin?      4-5 times /week- over 1 week 3. FLUIDS: What fluids or food have you vomited up today? Have you been able to keep any fluids down?     Trying to drink water 4. ABDOMEN PAIN: Are your having any abdomen pain? If Yes : How bad is it and what does it feel like? (e.g., crampy, dull, intermittent, constant)      sore 5. DIARRHEA: Is there any  diarrhea? If Yes, ask: How many times today?      A little  7. CAUSE: What do you think is causing your vomiting?     unsure 8. HYDRATION STATUS: Any signs of dehydration? (e.g., dry mouth [not only dry lips], too weak to stand) When did you last urinate?     Urinates alot 9. OTHER SYMPTOMS: Do you have any other symptoms? (e.g., fever, headache, vertigo, vomiting blood or coffee grounds, recent head injury)     Headache  Protocols used: Nausea-A-AH, Vomiting-A-AH Copied from CRM [id number]. Topic: Clinical - Red Word Triage >> Jul 22, 2024  3:01 PM [name] wrote: Red Word that prompted transfer to Nurse Triage: Nausea +1 Week, Loss of Appetite, Headache

## 2024-07-22 NOTE — Telephone Encounter (Signed)
 Patient is returning call, CAL stated that they will check with Jada to make sure if she needs to contact the patient.

## 2024-07-23 ENCOUNTER — Telehealth: Payer: Self-pay | Admitting: Student

## 2024-07-23 NOTE — Telephone Encounter (Signed)
 Patient was identified as falling into the [place] Measure - Diabetes.   Patient was: Patient is not currently using our practice.

## 2024-07-24 ENCOUNTER — Encounter: Payer: Self-pay | Admitting: Student

## 2024-07-24 ENCOUNTER — Ambulatory Visit (INDEPENDENT_AMBULATORY_CARE_PROVIDER_SITE_OTHER): Admitting: Student

## 2024-07-24 ENCOUNTER — Ambulatory Visit: Payer: Self-pay

## 2024-07-24 VITALS — BP 142/82 | HR 87 | Temp 98.4°F | Ht 61.0 in

## 2024-07-24 DIAGNOSIS — R1310 Dysphagia, unspecified: Secondary | ICD-10-CM | POA: Diagnosis not present

## 2024-07-24 DIAGNOSIS — K219 Gastro-esophageal reflux disease without esophagitis: Secondary | ICD-10-CM

## 2024-07-24 DIAGNOSIS — Z9181 History of falling: Secondary | ICD-10-CM | POA: Diagnosis not present

## 2024-07-24 DIAGNOSIS — W19XXXD Unspecified fall, subsequent encounter: Secondary | ICD-10-CM

## 2024-07-24 DIAGNOSIS — Z79899 Other long term (current) drug therapy: Secondary | ICD-10-CM | POA: Diagnosis not present

## 2024-07-24 DIAGNOSIS — R1030 Lower abdominal pain, unspecified: Secondary | ICD-10-CM | POA: Diagnosis not present

## 2024-07-24 MED ORDER — OMEPRAZOLE 40 MG PO CPDR
DELAYED_RELEASE_CAPSULE | ORAL | 3 refills | Status: AC
Start: 2024-07-24 — End: ?

## 2024-07-24 NOTE — Assessment & Plan Note (Signed)
 Patient has previously seen by me for falls.  Request the scooter.  Will give her referral for scooter.  Plan: - DME order for scooter

## 2024-07-24 NOTE — Patient Instructions (Addendum)
 Kara Kelley Harvin Dearl you for allowing me to take part in your care today.  Here are your instructions.  1.  Please stop taking your Ozempic .  2.  I have referred you to gastroenterology  3.  Make sure when you take your alendronate  you are drinking it with a full glass of water and you are staying straight up for 30 minutes prior to lying down.  4.  Continue taking Pepto-Bismol.  5.  If endoscopy is normal we will evaluate you for gastroparesis.   PLEASE BRING YOUR MEDICATIONS TO EVERY APPOINTMENT  Thank you, Dr. Tobie  If you have any other questions please contact the internal medicine clinic at [phone] If it is after hours, please call the [place] hospital at [phone] and then ask the person who picks up for the resident on call.

## 2024-07-24 NOTE — Assessment & Plan Note (Signed)
 Patient has polypharmacy.  Patient needs education about her medications.  Patient needs a good med reconciliation.  Plan: - Refer to value-based care for pharmacist

## 2024-07-24 NOTE — Progress Notes (Signed)
 CC: Dysphagia, nausea, vomiting, abdominal pain  HPI:  [name] is a 68 y.o. female with history of back GERD, hypothyroidism, type 2 diabetes, back pain, COPD, hypertension who presents with concerns of nausea, loss of appetite, headaches.  Please see assessment and plan for full HPI.  Past Medical History:  Diagnosis Date   Anemia    Anxiety    Arthritis    Asthma    Bacterial vaginosis 05/16/2022   Bunion    Callus    Candida vaginitis 09/16/2022   Cholelithiasis without obstruction 04/11/2022   Chronic pain    Cocaine abuse (HCC)    COPD (chronic obstructive pulmonary disease) (HCC)    Corns and callosities    Degenerative joint disease    Depression    Diabetes mellitus    Endometrial polyp    ETOH abuse    Gall stones    Gallbladder calculus with acute cholecystitis and no obstruction 04/11/2022   GERD (gastroesophageal reflux disease)    Headache    history of Migraines   Hepatitis C    Hep C   History of adenomatous polyp of colon 04/11/2022   History of endometrial ablation 02/06/2013   Hyperlipidemia    Hypertension    Insomnia    Spondylolisthesis of lumbar region    Substance abuse (HCC)    alcoholism   Tuberculosis 1985   Wears dentures    Wears glasses      Current Outpatient Medications:    Accu-Chek FastClix Lancets MISC, Use as directed to check blood sugars 2 times per day dx: e11.65, Disp: 102 each, Rfl: prn   Accu-Chek Softclix Lancets lancets, Use as instructed to check blood sugars twice daily E11.69, Disp: 100 each, Rfl: 2   albuterol  (VENTOLIN  HFA) 108 (90 Base) MCG/ACT inhaler, Inhale 1-2 puffs into the lungs every 6 (six) hours as needed for wheezing or shortness of breath., Disp: 18 g, Rfl: 2   alendronate  (FOSAMAX ) 10 MG tablet, Take 1 tablet (10 mg total) by mouth daily before breakfast. Take with a full glass of water on an empty stomach., Disp: 30 tablet, Rfl: 11   aspirin  EC 81 MG tablet, Take 1 tablet (81 mg total) by  mouth 2 (two) times daily. To prevent blood clots for 30 days after surgery., Disp: 60 tablet, Rfl: 0   atorvastatin  (LIPITOR) 10 MG tablet, Take 1 tablet (10 mg total) by mouth daily., Disp: 30 tablet, Rfl: 11   Blood Glucose Monitoring Suppl (ONETOUCH VERIO REFLECT) w/Device KIT, Use to check blood sugar up to 3 times a day, Disp: 1 kit, Rfl: 0   buPROPion  (WELLBUTRIN  XL) 300 MG 24 hr tablet, Take 1 tablet (300 mg total) by mouth in the morning and at bedtime., Disp: 180 tablet, Rfl: 1   Continuous Glucose Receiver (FREESTYLE LIBRE 3 READER) DEVI, Use to check your blood sugar continuously. (Patient not taking: Reported on 07/08/2024), Disp: 1 each, Rfl: 0   Continuous Glucose Sensor (FREESTYLE LIBRE 3 PLUS SENSOR) MISC, Change sensor every 15 days. Use to check your blood sugar continuously. (Patient not taking: Reported on 07/08/2024), Disp: 2 each, Rfl: 11   diclofenac  Sodium (VOLTAREN ) 1 % GEL, Apply 4 g topically 4 (four) times daily., Disp: 100 g, Rfl: 2   DULoxetine  (CYMBALTA ) 60 MG capsule, Take 1 capsule (60 mg total) by mouth daily., Disp: 90 capsule, Rfl: 3   empagliflozin  (JARDIANCE ) 10 MG TABS tablet, Take 1 tablet (10 mg total) by mouth daily., Disp:  30 tablet, Rfl: 11   fluconazole  (DIFLUCAN ) 150 MG tablet, Take 1 tablet (150 mg total) by mouth daily., Disp: 2 tablet, Rfl: 0   fluticasone -salmeterol (ADVAIR) 250-50 MCG/ACT AEPB, INHALE 1 PUFF BY MOUTH INTO LUNGS TWICE DAILY IN THE MORNING AND AT BEDTIME, Disp: 60 each, Rfl: 2   glucose blood (ONETOUCH ULTRA) test strip, TEST AS DIRECTED UPTO 3 TIMES DAILY, Disp: 100 strip, Rfl: 1   insulin  lispro (HUMALOG  KWIKPEN) 100 UNIT/ML KwikPen, ADMINISTER 5 UNITS UNDER THE SKIN THREE TIMES DAILY WITH MEALS, Disp: 3 mL, Rfl: 3   Insulin  Pen Needle (PEN NEEDLES) 32G X 4 MM MISC, Use new pen needle for each insulin  injection up to 4 times a day., Disp: 200 each, Rfl: 5   Iron , Ferrous Sulfate , 325 (65 Fe) MG TABS, Take 325 mg by mouth daily., Disp:  30 tablet, Rfl: 2   Lancet Device MISC, 1 each by Does not apply route in the morning, at noon, and at bedtime. May substitute to any manufacturer covered by patient's insurance., Disp: 100 each, Rfl: 0   lidocaine  (LIDODERM ) 5 %, Place 1 patch onto the skin daily. Remove & Discard patch within 12 hours or as directed by MD, Disp: 30 patch, Rfl: 0   LINZESS  145 MCG CAPS capsule, Take 145 mcg by mouth daily before breakfast. , Disp: , Rfl:    losartan -hydrochlorothiazide (HYZAAR) 100-25 MG tablet, Take 1 tablet by mouth daily., Disp: 90 tablet, Rfl: 3   meloxicam  (MOBIC ) 15 MG tablet, Take 1 tablet (15 mg total) by mouth daily as needed for pain (and inflammation)., Disp: 30 tablet, Rfl: 0   metFORMIN  (GLUCOPHAGE -XR) 500 MG 24 hr tablet, Take 2 tablets (1,000 mg total) by mouth 2 (two) times daily with a meal., Disp: 120 tablet, Rfl: 11   mineral oil-hydrophilic petrolatum  (AQUAPHOR) ointment, Apply topically as needed for dry skin., Disp: 420 g, Rfl: 0   Multiple Vitamins-Minerals (MULTIVITAMIN WITH MINERALS) tablet, Take 1 tablet by mouth daily. Alive, Disp: , Rfl:    omeprazole  (PRILOSEC) 40 MG capsule, TAKE 1 CAPSULE(40 MG) BY MOUTH DAILY BEFORE BREAKFAST, Disp: 90 capsule, Rfl: 3   ondansetron  (ZOFRAN -ODT) 4 MG disintegrating tablet, Take 1 tablet (4 mg total) by mouth every 8 (eight) hours as needed for nausea or vomiting., Disp: 15 tablet, Rfl: 0   oxyCODONE  (ROXICODONE ) 5 MG immediate release tablet, Take 1 tablet (5 mg total) by mouth 2 (two) times daily as needed for severe pain (pain score 7-10) or breakthrough pain. in the ankle after surgery for a fracture that is not resolved by first taking your normal daily dose of Hydrocodone  for chronic pain, Disp: 14 tablet, Rfl: 0   oxyCODONE -acetaminophen  (PERCOCET/ROXICET) 5-325 MG tablet, Take 1 tablet by mouth every 6 (six) hours as needed for severe pain (pain score 7-10)., Disp: 20 tablet, Rfl: 0   polyethylene glycol powder (GLYCOLAX /MIRALAX )  17 GM/SCOOP powder, Take 17 g by mouth 2 (two) times daily. Until daily soft stools OTC, Disp: 238 g, Rfl: 0   [START ON 07/29/2024] Semaglutide , 1 MG/DOSE, 4 MG/3ML SOPN, Inject 1 mg into the skin once a week for 4 doses., Disp: 3 mL, Rfl: 0   [START ON 08/29/2024] Semaglutide , 2 MG/DOSE, (OZEMPIC , 2 MG/DOSE,) 8 MG/3ML SOPN, Inject 2 mg into the skin once a week., Disp: 3 mL, Rfl: 3   Semaglutide ,0.25 or 0.5MG /DOS, 2 MG/3ML SOPN, Inject 0.5 mg into the skin once a week for 4 doses. (Patient not taking: Reported on 07/08/2024), Disp:  3 mL, Rfl: 0   traZODone  (DESYREL ) 50 MG tablet, Take 1 tablet (50 mg total) by mouth at bedtime as needed for sleep., Disp: 90 tablet, Rfl: 0   TRESIBA  FLEXTOUCH 100 UNIT/ML FlexTouch Pen, ADMINISTER 15 UNITS UNDER THE SKIN TWICE DAILY (Patient taking differently: No sig reported), Disp: 27 mL, Rfl: 1  Review of Systems:    GI: Patient endorses abdominal pain, dysphagia, nausea, vomiting  Physical Exam:  Vitals:   07/24/24 1449  BP: (!) 142/82  Pulse: 87  Temp: 98.4 F (36.9 C)  TempSrc: Oral  Height: 5' 1 (1.549 m)   General: Patient is sitting comfortably in the room  Head: Normocephalic, atraumatic  Cardio: Regular rate and rhythm, no murmurs, rubs or gallops Pulmonary: Clear to ausculation bilaterally with no rales, rhonchi, and crackles  Abdomen: Soft, with some right upper quadrant tenderness, normal active bowel signs, no distention, no rebound, no guarding   Assessment & Plan:   Assessment & Plan Falls, subsequent encounter Patient has previously seen by me for falls.  Request the scooter.  Will give her referral for scooter.  Plan: - DME order for scooter Dysphagia, unspecified type Patient reports 2-47-month history of dysphagia.  She states she feels as if solids get stuck in her throat.  She states sometimes she has to regurgitate the food.  This is partially digested food she states.  She states sometimes she has bile that she throws up  as well.  She has been passing gas.  She reports having a good bowel movement yesterday.  She denies any abdominal distention.  On my exam, patient does have right upper quadrant tenderness, otherwise benign exam.  Given patient's smoking history of 35 pack smoking years my first concern is esophageal mass.  Will refer to gastroenterology for potential endoscopy.  Did discuss concern for pill esophagitis as patient is on alendronate .  She states she takes this medication in the morning before breakfast with a full glass of water.  She does not lay down after taking any of her medications she states.  I do not think this is related to pill esophagitis.  Will encouraged her to double up her omeprazole  40 mg twice daily  Plan: - Refer to GI for endoscopy - Increase Omeprazole  to 40 mg BID  Polypharmacy Patient has polypharmacy.  Patient needs education about her medications.  Patient needs a good med reconciliation.  Plan: - Refer to value-based care for pharmacist  Lower abdominal pain Patient reports having lower abdominal pain.  This has been going on for 2-3 weeks.  She also reports having nausea and vomiting.  She has been staying hydrated, but has not been able to keep much solids down.  She kept on some crackers yesterday.  She has been eating open plain diet.  No one else in her house is sick.  On my exam, patient does have right upper quadrant tenderness, otherwise has a benign abdominal exam.  This pain is not intermittent.  It is rather constant.  She also reports constant nausea.  She describes it as a cramping in her lower abdomen.  It is followed by vomiting.  Patient is on Ozempic .  This could be contributing to her current symptoms.  Will stop Ozempic  today.  Will continue with Pepto-Bismol and Zofran .  If the symptoms not resolved with the discontinuation of Ozempic , potentially will need to send her for gastric emptying study to evaluate for gastroparesis.  Plan:  - Stop Ozempic   -  Supportive  care for now, if not improved with stopping ozempic , will need to evaluate with gastric emptying study - Refer to Gastroenterology    Patient discussed with Dr. Francesco Libby Blanch, DO Internal Medicine Resident PGY-3

## 2024-07-24 NOTE — Telephone Encounter (Signed)
 FYI Only or Action Required?: Action required by provider: update on patient condition and antiemetic request.  Patient was last seen in primary care on 07/24/2024 by Tobie Gaines, DO.  Called Nurse Triage reporting Vomiting.  Symptoms began today.  Interventions attempted: Rest, hydration, or home remedies.  Symptoms are: unchanged.  Triage Disposition: Home Care, Go to ED Now (or PCP Triage)  Patient/caregiver understands and will follow disposition?: No   Message from Chiquita SQUIBB sent at 07/24/2024  4:48 PM EDT  Reason for Triage: Patient is calling in stating that she saw the doctor today and the medicine she got made her sick, patient states she has been throwing up since the appointment. Patient is requesting a new medication for nausea.   Reason for Disposition  High-risk adult (e.g., diabetes mellitus, brain tumor, V-P shunt, hernia)    Evaluated today in office for same symptoms, no worse.  MILD to MODERATE vomiting (e.g., 1 - 5 times / day)  Answer Assessment - Initial Assessment Questions Additional info; Evaluated in office today for abdominal pain and nausea. Advised to stop ozempic , referral to GI. Patient calling back to request medication for nausea, she has vomited twice since her OV today. She is not eating due to poor appetite. Drinking fluids. No other concerns at this time. Requesting antiemetic to [facility].    1. VOMITING SEVERITY: How many times have you vomited in the past 24 hours?      Twice  2. ONSET: When did the vomiting begin?      After appointment today 3. FLUIDS: What fluids or food have you vomited up today? Have you been able to keep any fluids down?     Has not eaten today. Able to drink water.  4. ABDOMEN PAIN: Are your having any abdomen pain? If Yes : How bad is it and what does it feel like? (e.g., crampy, dull, intermittent, constant)      Mild  5. DIARRHEA: Is there any diarrhea? If Yes, ask: How many times today?       no 6. CONTACTS: Is there anyone else in the family with the same symptoms?       7. CAUSE: What do you think is causing your vomiting?     Unsure  8. HYDRATION STATUS: Any signs of dehydration? (e.g., dry mouth [not only dry lips], too weak to stand) When did you last urinate?     Dehydrated  9. OTHER SYMPTOMS: Do you have any other symptoms? (e.g., fever, headache, vertigo, vomiting blood or coffee grounds, recent head injury)     None at this time  10. PREGNANCY: Is there any chance you are pregnant? When was your last menstrual period?  Protocols used: Vomiting-A-AH

## 2024-07-25 ENCOUNTER — Other Ambulatory Visit: Payer: Self-pay | Admitting: Student

## 2024-07-25 MED ORDER — METOCLOPRAMIDE HCL 10 MG PO TABS: 10.0000 mg | ORAL_TABLET | Freq: Three times a day (TID) | ORAL | 0 refills | Status: AC | PRN

## 2024-07-25 NOTE — Progress Notes (Signed)
 Internal Medicine Clinic Attending  Case discussed with the resident at the time of the visit.  We reviewed the resident's history and exam and pertinent patient test results.  I agree with the assessment, diagnosis, and plan of care documented in the resident's note.

## 2024-07-25 NOTE — Telephone Encounter (Signed)
 Will forward message to Dr. Allena Katz

## 2024-07-25 NOTE — Telephone Encounter (Addendum)
 FYI Only or Action Required?: Action required by provider: referral request and clinical question for provider.Pt reports persistent severe headaches for past month. Seen in office yesterday. Has appt to see neurology 11/2024 but asking if referral can be sent to get in sooner and for any other recommendations.  Patient was last seen in primary care on 07/24/2024 by Tobie Gaines, DO.  Called Nurse Triage reporting Vomiting.  Symptoms began several months ago.  Interventions attempted: OTC medications: Tylenol  arthritis and Prescription medications: Hydrocodone  (has rx for back pain from a pain clinic).  Symptoms are: gradually worsening.  Triage Disposition: Call PCP when office is open.  Patient/caregiver understands and will follow disposition?: Yes  Pt calling back today reporting constant headaches near back of head towards the right, 10/10 in the morning. Drops down to 7/10 the rest of the day. Onset a few months ago. No neuro changes. Has hydrocodone  rx prescribed for back pain which has helped headaches, as well as tylenol  arthritis. No neuro changes. Has hx htn, does not check BP at home. BP during PCP office visit yesterday 142/82. Takes hyzaar. Reports she inquired about headaches during office visit yesterday but did not receive any treatment recommendation at that time. Asking to have neurology referral placed to be seen sooner, current neuro appt is scheduled for 11/2024.  Reason for Disposition  [1] MODERATE headache (e.g., interferes with normal activities) AND [2] present > 24 hours AND [3] unexplained  (Exceptions: Pain medicines not tried, typical migraine, or headache part of viral illness.)  Answer Assessment - Initial Assessment Questions 1. LOCATION: Where does it hurt?      Near back of head towards the right  2. ONSET: When did the headache start? (e.g., minutes, hours, days)      Several months ago  3. PATTERN: Does the pain come and go, or has it been constant  since it started?     Constant, worse in the morning.  4. SEVERITY: How bad is the pain? and What does it keep you from doing?  (e.g., Scale 1-10; mild, moderate, or severe)     7-10/10  5. RECURRENT SYMPTOM: Have you ever had headaches before? If Yes, ask: When was the last time? and What happened that time?      Not this type of head pain  6. CAUSE: What do you think is causing the headache?     Unsure  7. MIGRAINE: Have you been diagnosed with migraine headaches? If Yes, ask: Is this headache similar?      Has been getting migraines since childhood but only once a week. Reports this pain feels different than a migraine.  8. HEAD INJURY: Has there been any recent injury to your head?      No  9. OTHER SYMPTOMS: Do you have any other symptoms? (e.g., fever, stiff neck, eye pain, sore throat, cold symptoms)     No  Protocols used: Headache-A-AH

## 2024-07-29 ENCOUNTER — Telehealth: Payer: Self-pay

## 2024-07-29 ENCOUNTER — Other Ambulatory Visit

## 2024-07-29 NOTE — Telephone Encounter (Signed)
 Contacted patient for pharmacy telephone appointment scheduled this afternoon. She says she is not available to talk at this time and requests to reschedule. She reports that she is still feeling a bit nauseous. She is planning to stay off Ozempic  for now as instructed. Reports that I can talk with her and her daughter about her medications next Monday morning. Denies any urgent needs at this time.  Lorain Baseman, PharmD [facility] [phone]

## 2024-07-29 NOTE — Progress Notes (Deleted)
 07/29/2024 Name: Kara Kelley MRN: [id number] DOB: 1956/02/28  No chief complaint on file.   Kara Kelley is a 68 y.o. year old female who presented for a telephone visit.   They were referred to the pharmacist by their PCP for assistance in managing diabetes and polypharmacy. PMH includes HTN, COPD, hepatitis C, GERD, T2DM with retinopathy, osteoporosis, CKD3, HLD.    Subjective: Patient was seen by PCP, Kara Blanch, DO, on 06/05/24. At last visit, BP was controlled on losartan -hydrochlorothiazide. Her last A1C was 8.1% in May, increased from 7.4%. Previously, she was transitioned from Ozempic  1 mg to Mounjaro  2.5 mg weekly, but at her most recent visit she reported not tolerating Mounjaro  and was instructed to restart Ozempic  at 0.25 mg weekly. She was instructed to continue Tresiba , Humalog , and metformin  as previously prescribed. She was outreached by pharmacy via telephone on 04/19/24, but outreach was unsuccessful. Her pharmacy appt has been rescheduled multiple times. She was seen by Dr. Harrie on 06/19/24. A1C increased to 8.6%. She was instructed to titrate Ozempic . eGFR has remained < 45 mL/min on past 2 BMPs. She had a fall on 06/21/24 with L ankle fracture, and underwent surgery on 07/02/24. She was engaged by pharmacy via telephone on 07/08/24. She was instructed to start Ozempic  at 0.25 mg x4 weeks, then increase to 0.5 mg weekly. She reported her daughter would be able to help her set up CGM monitoring. She came to [facility] to see Dr. Blanch on 07/24/24, reporting worsened abdominal pain and nausea/vomiting. Also reported 2-3 mo hx of dysphagia. Ozempic  was discontinued and omeprazole  was increased to 40 mg twice daily. She was also referred to GI for an endoscopy.  Clarify insulin  dosing Revisit CGM  Today, patient reports she is not doing well. She is recovering from her surgery and is in a lot of pain. Her daughter is not home, and she reports that her daughter helps manage her  medications. When I spoke with her daughter earlier, she preferred that I call Kara Kelley for the appointment. While we were unable to review her full medication list, she was agreeable to review her diabetes medications.    Care Team: Primary Care Provider: Doyal Miyamoto, MD on 09/20/24   Medication Access/Adherence  Current Pharmacy:  [facility] [phone] - [place], [place] - [address] AT [facility] [address] [place] [address] Phone: [phone] Fax: [phone]   Patient reports affordability concerns with their medications: No  Patient reports access/transportation concerns to their pharmacy: Yes  - Daughter usually takes her to [facility] to pick up her medications Patient reports adherence concerns with their medications:  Yes  - patient unable to recall insulin  doses, reports her daughter Kara Kelley) usually helps give her the medications. Reports she has not started Ozempic  yet.   Diabetes:  Current medications: Tresiba  15 units BID (states she takes 20 units daily), Humalog  5 units TID with meals (reports she is taking), metformin  XR 1000 mg BID (reports she is taking one tablet with breakfast, then states she actually is taking 2 tablets twice a day), Jardiance  10 mg daily  Medications tried in the past: Mounjaro  - GI AE, Ozempic  - GI AE (worsened dysphagia, abdominal pain, n/v)  Current glucose readings: Reports that she is not currently checking. Has One Touch meter at home. Also has Freestyle Libre sensors that she has not set up yet. Reports she has an Android phone, and she is going  to see if her daughter can help her set these up.   Patient denies hypoglycemic s/sx including dizziness, shakiness, sweating. Patient denies hyperglycemic symptoms including polyuria, polydipsia, polyphagia, nocturia, neuropathy, blurred vision.  Current meal patterns: did not discuss in depth today  Current physical activity: limited by  ankle fracture currently (cannot walk at all) - has f/u on 07/12/24  Current medication access support: UHC Dual Complete  Hypertension:  Current medications:  losartan -hydrochlorothiazide 100-25 mg daily  Hyperlipidemia/ASCVD Risk Reduction  Current lipid lowering medications: atorvastatin  10 mg daily  Antiplatelet regimen: aspirin  81 mg daily  ASCVD History: none known Family History: heart disease in mother Risk Factors: HTN, T2DM, LDL > 100 mg/dL, former smoker  Clinical ASCVD: No  The 10-year ASCVD risk score (Arnett DK, et al., 2019) is: 24.8%   Values used to calculate the score:     Age: 64 years     Clincally relevant sex: Female     Is Non-Hispanic African American: Yes     Diabetic: Yes     Tobacco smoker: No     Systolic Blood Pressure: 142 mmHg     Is BP treated: Yes     HDL Cholesterol: 58 mg/dL     Total Cholesterol: 184 mg/dL    Objective:  BP Readings from Last 3 Encounters:  07/24/24 (!) 142/82  07/02/24 (!) 147/72  06/21/24 118/67    Lab Results  Component Value Date   HGBA1C 8.6 (A) 06/19/2024   HGBA1C 8.1 (A) 02/27/2024   HGBA1C 7.4 (A) 11/01/2023       Latest Ref Rng & Units 06/21/2024    3:11 PM 06/21/2024    2:46 PM 04/04/2024   11:06 AM  BMP  Glucose 70 - 99 mg/dL 863  864  884   BUN 8 - 23 mg/dL 11  10  19    Creatinine 0.44 - 1.00 mg/dL 8.59  8.64  8.66   BUN/Creat Ratio 12 - 28   14   Sodium 135 - 145 mmol/L 137  134  143   Potassium 3.5 - 5.1 mmol/L 3.7  3.6  3.5   Chloride 98 - 111 mmol/L 100  98  105   CO2 22 - 32 mmol/L  22  18   Calcium  8.9 - 10.3 mg/dL  9.3  9.0     Lab Results  Component Value Date   CHOL 184 06/19/2024   HDL 58 06/19/2024   LDLCALC 111 (H) 06/19/2024   TRIG 84 06/19/2024   CHOLHDL 3.2 06/19/2024    Medications Reviewed Today   Medications were not reviewed in this encounter       Assessment/Plan:   Diabetes: - Currently uncontrolled with most recent A1C of 8.6% above goal <7% and  worsened from 8.1% in May 2025 in the setting of discontinuing Mounjaro  due to GI AE. Patient reports she has not restarted Ozempic  yet, but she can do this today. Patient is not currently monitoring BG, advised her that I would send instructions via MyChart for her daughter to reference to set up her CGM.  - Last UACR Sept 2025 - 23 mg/g - Reviewed long term cardiovascular and renal outcomes of uncontrolled blood sugar - Reviewed goal A1c, goal fasting, and goal 2 hour post prandial glucose - Reviewed hypoglycemia management plan and the rule of 15 - Recommend to continue Tresiba  at current dose (written as 15 units BID, but patient reports she is taking 20 units daily - will clarify when I am  able to speak with her daughter)  - Recommend to continue Humalog  5 units TID with meals - Recommend to START Ozempic  0.25 mg x 4 weeks then increase to 0.5 mg weekly as previously instructed - Recommend to continue metformin  XR 1000 mg BID. Patient is at cusp of needing dose reduction to TDD of 1000 mg/d with eGFR ~45 mL/min.  - Recommend to continue Jardiance  10 mg daily. Recommend increasing to 25 mg daily once glycemic control improves.  - Recommend to check glucose continuously using CGM - will send instructions via MyChart today - Next A1C due Dec 2025  - Noted that patient is overdue for eye exam. She requested new referral to ophthalmology. Collaborated with provider to place today.     Hypertension: - Currently controlled with clinic BP consistently below goal less than 130/80. - Recommend to continue losartan -hydrochlorothiazide 100-25 mg daily     Hyperlipidemia/ASCVD Risk Reduction: - Currently uncontrolled with most recent LDL-C of 111 mg/dL above goal < 70 mg/dL. Patient is prescribed moderate intensity statin. Appears she restarted after period of nonadherence in May. Should consider escalating to high intensity statin at follow-up, given presence of multiple cardiac risk factors.  -  Recommend to continue atorvastatin  10 mg daily   Written patient instructions provided. Patient verbalized understanding of treatment plan.   Follow Up Plan:  Pharmacist telephone 07/29/24 PCP clinic visit 09/20/24  Lorain Baseman, PharmD [facility] [phone]

## 2024-08-05 ENCOUNTER — Other Ambulatory Visit

## 2024-08-05 DIAGNOSIS — M25572 Pain in left ankle and joints of left foot: Secondary | ICD-10-CM | POA: Diagnosis not present

## 2024-08-05 NOTE — Progress Notes (Deleted)
 08/05/2024 Name: Kara Kelley MRN: [id number] DOB: 1956/03/15  No chief complaint on file.   Kara Kelley is a 68 y.o. year old female who presented for a telephone visit.   They were referred to the pharmacist by their PCP for assistance in managing diabetes and polypharmacy. PMH includes HTN, COPD, hepatitis C, GERD, T2DM with retinopathy, osteoporosis, CKD3, HLD.    Subjective: Patient was seen by PCP, Kara Blanch, DO, on 06/05/24. At last visit, BP was controlled on losartan -hydrochlorothiazide. Her last A1C was 8.1% in May, increased from 7.4%. Previously, she was transitioned from Ozempic  1 mg to Mounjaro  2.5 mg weekly, but at her most recent visit she reported not tolerating Mounjaro  and was instructed to restart Ozempic  at 0.25 mg weekly. She was instructed to continue Tresiba , Humalog , and metformin  as previously prescribed. She was outreached by pharmacy via telephone on 04/19/24, but outreach was unsuccessful. Her pharmacy appt has been rescheduled multiple times. She was seen by Dr. Harrie on 06/19/24. A1C increased to 8.6%. She was instructed to titrate Ozempic . eGFR has remained < 45 mL/min on past 2 BMPs. She had a fall on 06/21/24 with L ankle fracture, and underwent surgery on 07/02/24. She was engaged by pharmacy via telephone on 07/08/24. She was instructed to start Ozempic  at 0.25 mg x4 weeks, then increase to 0.5 mg weekly. She reported her daughter would be able to help her set up CGM monitoring. She came to [facility] to see Dr. Blanch on 07/24/24, reporting worsened abdominal pain and nausea/vomiting. Also reported 2-3 mo hx of dysphagia. Ozempic  was discontinued and omeprazole  was increased to 40 mg twice daily. She was also referred to GI for an endoscopy.  Clarify insulin  dosing - may need to increase since discontinued Ozempic  Revisit CGM Increase statin  Today, patient reports she is not doing well. She is recovering from her surgery and is in a lot of pain. Her daughter is  not home, and she reports that her daughter helps manage her medications. When I spoke with her daughter earlier, she preferred that I call Ms. Kara Kelley for the appointment. While we were unable to review her full medication list, she was agreeable to review her diabetes medications.    Care Team: Primary Care Provider: Doyal Miyamoto, MD on 09/20/24   Medication Access/Adherence  Current Pharmacy:  Kara Kelley [phone] - [place], [place] - [address] AT [facility] [address]  [place] [address] Phone: [phone] Fax: [phone]   Patient reports affordability concerns with their medications: No  Patient reports access/transportation concerns to their pharmacy: Yes  - Daughter usually takes her to [facility] to [facility] her [facility] Patient reports adherence concerns with their medications:  Yes  - patient unable to recall insulin  doses, reports her daughter Kara Kelley) usually helps give her the medications. Reports she has not started Ozempic  yet.   Diabetes:  Current medications: Tresiba  15 units BID (states she takes 20 units daily), Humalog  5 units TID with meals (reports she is taking), metformin  XR 1000 mg BID (reports she is taking one tablet with breakfast, then states she actually is taking 2 tablets twice a day), Jardiance  10 mg daily  Medications tried in the past: Mounjaro  - GI AE, Ozempic  - GI AE (worsened dysphagia, abdominal pain, n/v)  Current glucose readings: Reports that she is not currently checking. Has One Touch meter at home. Also has Freestyle Libre sensors that she has not set up yet.  Reports she has an Android phone, and she is going to see if her daughter can help her set these up.   Patient denies hypoglycemic s/sx including dizziness, shakiness, sweating. Patient denies hyperglycemic symptoms including polyuria, polydipsia, polyphagia, nocturia, neuropathy, blurred vision.  Current meal patterns: did not  discuss in depth today  Current physical activity: limited by ankle fracture currently (cannot walk at all) - has f/u on 07/12/24  Current medication access support: UHC Dual Complete  Hypertension:  Current medications:  losartan -hydrochlorothiazide 100-25 mg daily  Hyperlipidemia/ASCVD Risk Reduction  Current lipid lowering medications: atorvastatin  10 mg daily  Antiplatelet regimen: aspirin  81 mg daily  ASCVD History: none known Family History: heart disease in mother Risk Factors: HTN, T2DM, LDL > 100 mg/dL, former smoker  Clinical ASCVD: No  The 10-year ASCVD risk score (Arnett DK, et al., 2019) is: 24.8%   Values used to calculate the score:     Age: 38 years     Clincally relevant sex: Female     Is Non-Hispanic African American: Yes     Diabetic: Yes     Tobacco smoker: No     Systolic Blood Pressure: 142 mmHg     Is BP treated: Yes     HDL Cholesterol: 58 mg/dL     Total Cholesterol: 184 mg/dL    Objective:  BP Readings from Last 3 Encounters:  07/24/24 (!) 142/82  07/02/24 (!) 147/72  06/21/24 118/67    Lab Results  Component Value Date   HGBA1C 8.6 (A) 06/19/2024   HGBA1C 8.1 (A) 02/27/2024   HGBA1C 7.4 (A) 11/01/2023       Latest Ref Rng & Units 06/21/2024    3:11 PM 06/21/2024    2:46 PM 04/04/2024   11:06 AM  BMP  Glucose 70 - 99 mg/dL 863  864  884   BUN 8 - 23 mg/dL 11  10  19    Creatinine 0.44 - 1.00 mg/dL 8.59  8.64  8.66   BUN/Creat Ratio 12 - 28   14   Sodium 135 - 145 mmol/L 137  134  143   Potassium 3.5 - 5.1 mmol/L 3.7  3.6  3.5   Chloride 98 - 111 mmol/L 100  98  105   CO2 22 - 32 mmol/L  22  18   Calcium  8.9 - 10.3 mg/dL  9.3  9.0     Lab Results  Component Value Date   CHOL 184 06/19/2024   HDL 58 06/19/2024   LDLCALC 111 (H) 06/19/2024   TRIG 84 06/19/2024   CHOLHDL 3.2 06/19/2024    Medications Reviewed Today   Medications were not reviewed in this encounter       Assessment/Plan:   Diabetes: - Currently  uncontrolled with most recent A1C of 8.6% above goal <7% and worsened from 8.1% in May 2025 in the setting of discontinuing Mounjaro  due to GI AE. Patient reports she has not restarted Ozempic  yet, but she can do this today. Patient is not currently monitoring BG, advised her that I would send instructions via MyChart for her daughter to reference to set up her CGM.  - Last UACR Sept 2025 - 23 mg/g - Reviewed long term cardiovascular and renal outcomes of uncontrolled blood sugar - Reviewed goal A1c, goal fasting, and goal 2 hour post prandial glucose - Reviewed hypoglycemia management plan and the rule of 15 - Recommend to continue Tresiba  at current dose (written as 15 units BID, but patient reports she is  taking 20 units daily - will clarify when I am able to speak with her daughter)  - Recommend to continue Humalog  5 units TID with meals - Recommend to START Ozempic  0.25 mg x 4 weeks then increase to 0.5 mg weekly as previously instructed - Recommend to continue metformin  XR 1000 mg BID. Patient is at cusp of needing dose reduction to TDD of 1000 mg/d with eGFR ~45 mL/min.  - Recommend to continue Jardiance  10 mg daily. Recommend increasing to 25 mg daily once glycemic control improves.  - Recommend to check glucose continuously using CGM - will send instructions via MyChart today - Next A1C due Dec 2025  - Noted that patient is overdue for eye exam. She requested new referral to ophthalmology. Collaborated with provider to place today.     Hypertension: - Currently controlled with clinic BP consistently below goal less than 130/80. - Recommend to continue losartan -hydrochlorothiazide 100-25 mg daily     Hyperlipidemia/ASCVD Risk Reduction: - Currently uncontrolled with most recent LDL-C of 111 mg/dL above goal < 70 mg/dL. Patient is prescribed moderate intensity statin. Appears she restarted after period of nonadherence in May. Should consider escalating to high intensity statin at  follow-up, given presence of multiple cardiac risk factors.  - Recommend to continue atorvastatin  10 mg daily   Written patient instructions provided. Patient verbalized understanding of treatment plan.   Follow Up Plan:  Pharmacist telephone 07/29/24 PCP clinic visit 09/20/24  Lorain Baseman, PharmD [facility] [phone]

## 2024-08-08 ENCOUNTER — Other Ambulatory Visit: Payer: Self-pay

## 2024-08-08 ENCOUNTER — Ambulatory Visit: Admitting: Orthopedic Surgery

## 2024-08-08 ENCOUNTER — Other Ambulatory Visit (INDEPENDENT_AMBULATORY_CARE_PROVIDER_SITE_OTHER): Payer: Self-pay

## 2024-08-08 DIAGNOSIS — M25572 Pain in left ankle and joints of left foot: Secondary | ICD-10-CM

## 2024-08-08 DIAGNOSIS — T847XXA Infection and inflammatory reaction due to other internal orthopedic prosthetic devices, implants and grafts, initial encounter: Secondary | ICD-10-CM | POA: Diagnosis not present

## 2024-08-08 DIAGNOSIS — K219 Gastro-esophageal reflux disease without esophagitis: Secondary | ICD-10-CM | POA: Diagnosis not present

## 2024-08-08 DIAGNOSIS — R1310 Dysphagia, unspecified: Secondary | ICD-10-CM | POA: Diagnosis not present

## 2024-08-08 NOTE — Progress Notes (Signed)
 PCP - Libby Patel,DO Cardiologist - denies  PPM/ICD - denies Device Orders -  Rep Notified -   Chest x-ray - na EKG - 03/23/24 Stress Test - denies ECHO - denies Cardiac Cath - denies  Sleep Study - denies CPAP - no  Fasting Blood Sugar -  Checks Blood Sugar - unknown  WHAT DO I DO ABOUT MY DIABETES MEDICATION? Do not take oral diabetes medicines (pills) the morning of surgery. Do not take Jardiance (empagliflozin ) the morning of surgery.  THE NIGHT BEFORE SURGERY, take 50 units of Tresiba  insulin .    If your CBG is greater than 220 mg/dL the morning of surgery, you may take  dose (7 units) of your Humalog (lispro) insulin .  Check your blood sugar the morning of your surgery when you wake up and every 2 hours until you get to the Short Stay unit.  If your blood sugar is less than 70 mg/dL, you will need to treat for low blood sugar: Do not take insulin . Treat a low blood sugar (less than 70 mg/dL) with  cup of clear juice (cranberry or apple), 4 glucose tablets, OR glucose gel. Recheck blood sugar in 15 minutes after treatment (to make sure it is greater than 70 mg/dL). If your blood sugar is not greater than 70 mg/dL on recheck, call [phone] for further instructions. Report your blood sugar to the short stay nurse when you get to Short Stay.  Last dose of GLP1 agonist-  pt's daughter,Alnisa Greene,states that pt last took Ozempic  over a week ago. GLP1 instructions: hold Ozempic  prior to surgery.   Instructions for checking blood sugar and diabetes medications reviewed with patient's daughter,Alnisa Greene who verbalized understanding of all instructions given.   Blood Thinner Instructions:na Aspirin  Instructions: follow DrRONITA Crist instructions regarding aspirin .   ERAS Protcol - clears until 1015 PRE-SURGERY Ensure or G2- no  COVID TEST- na   Anesthesia review: no  Patient denies shortness of breath, fever, cough and chest pain over phone call.    All  instructions explained to the patient, with a verbal understanding of the material. The opportunity to ask questions was provided.

## 2024-08-09 ENCOUNTER — Encounter: Payer: Self-pay | Admitting: Orthopedic Surgery

## 2024-08-09 ENCOUNTER — Ambulatory Visit (HOSPITAL_COMMUNITY)

## 2024-08-09 ENCOUNTER — Encounter (HOSPITAL_COMMUNITY)
Admission: RE | Disposition: A | Discharge status: Home / Self Care | Payer: Self-pay | Source: Home / Self Care | Attending: Orthopedic Surgery

## 2024-08-09 ENCOUNTER — Observation Stay (HOSPITAL_COMMUNITY)
Admission: RE | Admit: 2024-08-09 | Discharge: 2024-08-13 | Disposition: A | Attending: Orthopedic Surgery | Admitting: Orthopedic Surgery

## 2024-08-09 ENCOUNTER — Ambulatory Visit (HOSPITAL_BASED_OUTPATIENT_CLINIC_OR_DEPARTMENT_OTHER)

## 2024-08-09 ENCOUNTER — Encounter (HOSPITAL_COMMUNITY): Payer: Self-pay | Admitting: Orthopedic Surgery

## 2024-08-09 DIAGNOSIS — S82842R Displaced bimalleolar fracture of left lower leg, subsequent encounter for open fracture type IIIA, IIIB, or IIIC with malunion: Principal | ICD-10-CM | POA: Insufficient documentation

## 2024-08-09 DIAGNOSIS — Z794 Long term (current) use of insulin: Secondary | ICD-10-CM | POA: Diagnosis not present

## 2024-08-09 DIAGNOSIS — Z87891 Personal history of nicotine dependence: Secondary | ICD-10-CM | POA: Diagnosis not present

## 2024-08-09 DIAGNOSIS — Z79899 Other long term (current) drug therapy: Secondary | ICD-10-CM | POA: Diagnosis not present

## 2024-08-09 DIAGNOSIS — T8469XA Infection and inflammatory reaction due to internal fixation device of other site, initial encounter: Secondary | ICD-10-CM | POA: Diagnosis not present

## 2024-08-09 DIAGNOSIS — T847XXA Infection and inflammatory reaction due to other internal orthopedic prosthetic devices, implants and grafts, initial encounter: Secondary | ICD-10-CM | POA: Diagnosis not present

## 2024-08-09 DIAGNOSIS — J4489 Other specified chronic obstructive pulmonary disease: Secondary | ICD-10-CM | POA: Diagnosis not present

## 2024-08-09 DIAGNOSIS — Z7984 Long term (current) use of oral hypoglycemic drugs: Secondary | ICD-10-CM | POA: Diagnosis not present

## 2024-08-09 DIAGNOSIS — I10 Essential (primary) hypertension: Secondary | ICD-10-CM

## 2024-08-09 DIAGNOSIS — Z7982 Long term (current) use of aspirin: Secondary | ICD-10-CM | POA: Insufficient documentation

## 2024-08-09 DIAGNOSIS — Y792 Prosthetic and other implants, materials and accessory orthopedic devices associated with adverse incidents: Secondary | ICD-10-CM | POA: Insufficient documentation

## 2024-08-09 DIAGNOSIS — S91002A Unspecified open wound, left ankle, initial encounter: Principal | ICD-10-CM | POA: Diagnosis present

## 2024-08-09 DIAGNOSIS — E119 Type 2 diabetes mellitus without complications: Secondary | ICD-10-CM | POA: Diagnosis not present

## 2024-08-09 HISTORY — PX: HARDWARE REMOVAL: SHX979

## 2024-08-09 LAB — GLUCOSE, CAPILLARY
Glucose-Capillary: 117 mg/dL — ABNORMAL HIGH (ref 70–99)
Glucose-Capillary: 84 mg/dL (ref 70–99)
Glucose-Capillary: 99 mg/dL (ref 70–99)
Glucose-Capillary: 144 mg/dL — ABNORMAL HIGH (ref 70–99)
Glucose-Capillary: 142 mg/dL — ABNORMAL HIGH (ref 70–99)

## 2024-08-09 LAB — COMPREHENSIVE METABOLIC PANEL WITH GFR
ALT: 11 U/L (ref 0–44)
AST: 20 U/L (ref 15–41)
Albumin: 3.4 g/dL — ABNORMAL LOW (ref 3.5–5.0)
Alkaline Phosphatase: 60 U/L (ref 38–126)
Anion gap: 12 (ref 5–15)
BUN: 6 mg/dL — ABNORMAL LOW (ref 8–23)
CO2: 19 mmol/L — ABNORMAL LOW (ref 22–32)
Calcium: 8.2 mg/dL — ABNORMAL LOW (ref 8.9–10.3)
Chloride: 108 mmol/L (ref 98–111)
Creatinine, Ser: 0.89 mg/dL (ref 0.44–1.00)
GFR, Estimated: >60 mL/min (ref >60)
Glucose, Bld: 102 mg/dL — ABNORMAL HIGH (ref 70–99)
Potassium: 4.3 mmol/L (ref 3.5–5.1)
Sodium: 139 mmol/L (ref 135–145)
Total Bilirubin: 0.5 mg/dL (ref 0.0–1.2)
Total Protein: 6.4 g/dL — ABNORMAL LOW (ref 6.5–8.1)

## 2024-08-09 LAB — CBC
HCT: 32.8 % — ABNORMAL LOW (ref 36.0–46.0)
Hemoglobin: 10.4 g/dL — ABNORMAL LOW (ref 12.0–15.0)
MCH: 24.8 pg — ABNORMAL LOW (ref 26.0–34.0)
MCHC: 31.7 g/dL (ref 30.0–36.0)
MCV: 78.3 fL — ABNORMAL LOW (ref 80.0–100.0)
Platelets: 192 K/uL (ref 150–400)
RBC: 4.19 MIL/uL (ref 3.87–5.11)
RDW: 16.9 % — ABNORMAL HIGH (ref 11.5–15.5)
WBC: 5.4 K/uL (ref 4.0–10.5)
nRBC: 0 % (ref 0.0–0.2)

## 2024-08-09 SURGERY — REMOVAL, HARDWARE
Anesthesia: General | Laterality: Left

## 2024-08-09 MED ORDER — FENTANYL CITRATE (PF) 100 MCG/2ML IJ SOLN
25.0000 ug | INTRAMUSCULAR | Status: DC | PRN
  Administered 2024-08-09 (×3): 50 ug via INTRAVENOUS

## 2024-08-09 MED ORDER — DEXAMETHASONE SOD PHOSPHATE PF 10 MG/ML IJ SOLN
INTRAMUSCULAR | Status: DC | PRN
  Administered 2024-08-09: 4 mg via INTRAVENOUS

## 2024-08-09 MED ORDER — ONDANSETRON HCL 4 MG/2ML IJ SOLN: 4.0000 mg | Freq: Once | INTRAMUSCULAR | Status: DC | PRN

## 2024-08-09 MED ORDER — ONDANSETRON HCL 4 MG PO TABS
4.0000 mg | ORAL_TABLET | Freq: Three times a day (TID) | ORAL | Status: DC | PRN
  Administered 2024-08-10 – 2024-08-11 (×2): 4 mg via ORAL
  Filled 2024-08-09 (×2): qty 1

## 2024-08-09 MED ORDER — FLUTICASONE FUROATE-VILANTEROL 200-25 MCG/ACT IN AEPB
1.0000 | INHALATION_SPRAY | Freq: Every day | RESPIRATORY_TRACT | Status: DC
  Administered 2024-08-10 – 2024-08-12 (×3): 1 via RESPIRATORY_TRACT
  Filled 2024-08-09: qty 28

## 2024-08-09 MED ORDER — FENTANYL CITRATE (PF) 100 MCG/2ML IJ SOLN
INTRAMUSCULAR | Status: AC
  Filled 2024-08-09: qty 2

## 2024-08-09 MED ORDER — OXYCODONE HCL 5 MG PO TABS: 5.0000 mg | ORAL_TABLET | Freq: Once | ORAL | Status: DC | PRN

## 2024-08-09 MED ORDER — VASHE WOUND IRRIGATION OPTIME
TOPICAL | Status: DC | PRN
  Administered 2024-08-09: 34 [oz_av]

## 2024-08-09 MED ORDER — MIDAZOLAM HCL (PF) 2 MG/2ML IJ SOLN
INTRAMUSCULAR | Status: DC | PRN
  Administered 2024-08-09: 2 mg via INTRAVENOUS

## 2024-08-09 MED ORDER — ACETAMINOPHEN 10 MG/ML IV SOLN
INTRAVENOUS | Status: AC
  Filled 2024-08-09: qty 100

## 2024-08-09 MED ORDER — LACTATED RINGERS IV SOLN: INTRAVENOUS | Status: DC

## 2024-08-09 MED ORDER — PROPOFOL 10 MG/ML IV BOLUS: INTRAVENOUS | Status: DC | PRN

## 2024-08-09 MED ORDER — ALBUTEROL SULFATE HFA 108 (90 BASE) MCG/ACT IN AERS: 1.0000 | INHALATION_SPRAY | Freq: Four times a day (QID) | RESPIRATORY_TRACT | Status: DC | PRN

## 2024-08-09 MED ORDER — OXYCODONE HCL 5 MG PO TABS
10.0000 mg | ORAL_TABLET | ORAL | Status: DC | PRN
  Administered 2024-08-09 (×2): 15 mg via ORAL
  Administered 2024-08-11 – 2024-08-12 (×3): 10 mg via ORAL
  Filled 2024-08-09: qty 3
  Filled 2024-08-09: qty 2
  Filled 2024-08-09: qty 3
  Filled 2024-08-09: qty 2

## 2024-08-09 MED ORDER — EMPAGLIFLOZIN 10 MG PO TABS
10.0000 mg | ORAL_TABLET | Freq: Every day | ORAL | Status: DC
Start: 2024-08-09 — End: 2024-08-13
  Administered 2024-08-10 – 2024-08-12 (×3): 10 mg via ORAL
  Filled 2024-08-09 (×5): qty 1

## 2024-08-09 MED ORDER — ACETAMINOPHEN 500 MG PO TABS
1000.0000 mg | ORAL_TABLET | Freq: Four times a day (QID) | ORAL | Status: AC
  Administered 2024-08-09 – 2024-08-10 (×4): 1000 mg via ORAL
  Filled 2024-08-09 (×4): qty 2

## 2024-08-09 MED ORDER — MIDAZOLAM HCL 2 MG/2ML IJ SOLN
INTRAMUSCULAR | Status: AC
  Filled 2024-08-09: qty 2

## 2024-08-09 MED ORDER — ALBUTEROL SULFATE (2.5 MG/3ML) 0.083% IN NEBU: 2.5000 mg | INHALATION_SOLUTION | RESPIRATORY_TRACT | Status: DC | PRN

## 2024-08-09 MED ORDER — METFORMIN HCL ER 500 MG PO TB24
1000.0000 mg | ORAL_TABLET | Freq: Two times a day (BID) | ORAL | Status: DC
  Administered 2024-08-09 – 2024-08-13 (×8): 1000 mg via ORAL
  Filled 2024-08-09 (×8): qty 2

## 2024-08-09 MED ORDER — LINACLOTIDE 145 MCG PO CAPS: 290.0000 ug | ORAL_CAPSULE | Freq: Every day | ORAL | Status: DC | PRN

## 2024-08-09 MED ORDER — TRAZODONE HCL 50 MG PO TABS
50.0000 mg | ORAL_TABLET | Freq: Every evening | ORAL | Status: DC | PRN
  Administered 2024-08-10: 50 mg via ORAL
  Filled 2024-08-09: qty 1

## 2024-08-09 MED ORDER — POLYETHYLENE GLYCOL 3350 17 GM/SCOOP PO POWD
17.0000 g | Freq: Two times a day (BID) | ORAL | Status: DC
  Administered 2024-08-09: 17 g via ORAL
  Filled 2024-08-09 (×2): qty 119

## 2024-08-09 MED ORDER — INSULIN ASPART 100 UNIT/ML IJ SOLN
15.0000 [IU] | Freq: Three times a day (TID) | INTRAMUSCULAR | Status: DC
  Administered 2024-08-09 – 2024-08-10 (×2): 15 [IU] via SUBCUTANEOUS
  Filled 2024-08-09: qty 15

## 2024-08-09 MED ORDER — LOSARTAN POTASSIUM-HCTZ 100-25 MG PO TABS: 1.0000 | ORAL_TABLET | Freq: Every day | ORAL | Status: DC

## 2024-08-09 MED ORDER — PANTOPRAZOLE SODIUM 40 MG PO TBEC
80.0000 mg | DELAYED_RELEASE_TABLET | Freq: Every day | ORAL | Status: DC
  Administered 2024-08-09 – 2024-08-12 (×4): 80 mg via ORAL
  Filled 2024-08-09 (×4): qty 2

## 2024-08-09 MED ORDER — VANCOMYCIN HCL 1000 MG IV SOLR
INTRAVENOUS | Status: DC | PRN
Start: 2024-08-09 — End: 2024-08-09
  Administered 2024-08-09: 1000 mg via TOPICAL

## 2024-08-09 MED ORDER — CEFAZOLIN SODIUM-DEXTROSE 2-4 GM/100ML-% IV SOLN
2.0000 g | Freq: Once | INTRAVENOUS | Status: DC
  Filled 2024-08-09: qty 100

## 2024-08-09 MED ORDER — DOCUSATE SODIUM 100 MG PO CAPS
100.0000 mg | ORAL_CAPSULE | Freq: Two times a day (BID) | ORAL | Status: DC
  Administered 2024-08-09 – 2024-08-12 (×5): 100 mg via ORAL
  Filled 2024-08-09 (×6): qty 1

## 2024-08-09 MED ORDER — FENTANYL CITRATE (PF) 250 MCG/5ML IJ SOLN
INTRAMUSCULAR | Status: DC | PRN
  Administered 2024-08-09 (×2): 25 ug via INTRAVENOUS
  Administered 2024-08-09: 50 ug via INTRAVENOUS
  Administered 2024-08-09 (×3): 25 ug via INTRAVENOUS
  Administered 2024-08-09: 75 ug via INTRAVENOUS

## 2024-08-09 MED ORDER — BUPROPION HCL ER (XL) 150 MG PO TB24
300.0000 mg | ORAL_TABLET | Freq: Two times a day (BID) | ORAL | Status: DC
  Administered 2024-08-09 – 2024-08-13 (×8): 300 mg via ORAL
  Filled 2024-08-09 (×8): qty 2

## 2024-08-09 MED ORDER — LIDOCAINE 5 % EX PTCH
1.0000 | MEDICATED_PATCH | CUTANEOUS | Status: DC
  Administered 2024-08-10 – 2024-08-12 (×3): 1 via TRANSDERMAL
  Filled 2024-08-09 (×3): qty 1

## 2024-08-09 MED ORDER — OXYCODONE HCL 5 MG PO TABS
5.0000 mg | ORAL_TABLET | ORAL | Status: DC | PRN
  Administered 2024-08-11 – 2024-08-13 (×2): 10 mg via ORAL
  Filled 2024-08-09 (×2): qty 2
  Filled 2024-08-09: qty 1
  Filled 2024-08-09: qty 2

## 2024-08-09 MED ORDER — CYCLOBENZAPRINE HCL 10 MG PO TABS
10.0000 mg | ORAL_TABLET | Freq: Every day | ORAL | Status: DC
  Administered 2024-08-09 – 2024-08-12 (×4): 10 mg via ORAL
  Filled 2024-08-09 (×4): qty 1

## 2024-08-09 MED ORDER — ALENDRONATE SODIUM 10 MG PO TABS
10.0000 mg | ORAL_TABLET | Freq: Every day | ORAL | Status: DC
  Filled 2024-08-09: qty 1

## 2024-08-09 MED ORDER — ACETAMINOPHEN 10 MG/ML IV SOLN
1000.0000 mg | Freq: Once | INTRAVENOUS | Status: DC | PRN
  Administered 2024-08-09: 1000 mg via INTRAVENOUS

## 2024-08-09 MED ORDER — DEXMEDETOMIDINE HCL IN NACL 80 MCG/20ML IV SOLN
INTRAVENOUS | Status: DC | PRN
  Administered 2024-08-09: 10 ug via INTRAVENOUS

## 2024-08-09 MED ORDER — INSULIN LISPRO (1 UNIT DIAL) 100 UNIT/ML (KWIKPEN): 15.0000 [IU] | PEN_INJECTOR | Freq: Three times a day (TID) | SUBCUTANEOUS | Status: DC

## 2024-08-09 MED ORDER — ACETAMINOPHEN 325 MG PO TABS: 325.0000 mg | ORAL_TABLET | Freq: Four times a day (QID) | ORAL | Status: DC | PRN

## 2024-08-09 MED ORDER — DULOXETINE HCL 60 MG PO CPEP
60.0000 mg | ORAL_CAPSULE | Freq: Two times a day (BID) | ORAL | Status: DC
  Administered 2024-08-09 – 2024-08-12 (×7): 60 mg via ORAL
  Filled 2024-08-09 (×7): qty 1

## 2024-08-09 MED ORDER — HYDROMORPHONE HCL 1 MG/ML IJ SOLN
0.5000 mg | INTRAMUSCULAR | Status: DC | PRN
  Administered 2024-08-09 – 2024-08-10 (×3): 1 mg via INTRAVENOUS
  Filled 2024-08-09 (×3): qty 1

## 2024-08-09 MED ORDER — HYDROCHLOROTHIAZIDE 25 MG PO TABS
25.0000 mg | ORAL_TABLET | Freq: Every day | ORAL | Status: DC
  Administered 2024-08-09 – 2024-08-12 (×4): 25 mg via ORAL
  Filled 2024-08-09 (×4): qty 1

## 2024-08-09 MED ORDER — CEFAZOLIN SODIUM-DEXTROSE 2-4 GM/100ML-% IV SOLN
2.0000 g | Freq: Four times a day (QID) | INTRAVENOUS | Status: AC
  Administered 2024-08-09 – 2024-08-10 (×2): 2 g via INTRAVENOUS
  Filled 2024-08-09 (×2): qty 100

## 2024-08-09 MED ORDER — ATORVASTATIN CALCIUM 10 MG PO TABS
10.0000 mg | ORAL_TABLET | Freq: Every day | ORAL | Status: DC
Start: 2024-08-09 — End: 2024-08-13
  Administered 2024-08-10 – 2024-08-12 (×3): 10 mg via ORAL
  Filled 2024-08-09 (×3): qty 1

## 2024-08-09 MED ORDER — PROPOFOL 10 MG/ML IV BOLUS
INTRAVENOUS | Status: DC | PRN
  Administered 2024-08-09: 30 mg via INTRAVENOUS

## 2024-08-09 MED ORDER — OXYCODONE HCL 5 MG/5ML PO SOLN: 5.0000 mg | Freq: Once | ORAL | Status: DC | PRN

## 2024-08-09 MED ORDER — LOSARTAN POTASSIUM 50 MG PO TABS
100.0000 mg | ORAL_TABLET | Freq: Every day | ORAL | Status: DC
  Administered 2024-08-09 – 2024-08-12 (×4): 100 mg via ORAL
  Filled 2024-08-09 (×4): qty 2

## 2024-08-09 MED ORDER — ORAL CARE MOUTH RINSE: 15.0000 mL | Freq: Once | OROMUCOSAL | Status: AC

## 2024-08-09 MED ORDER — CHLORHEXIDINE GLUCONATE 0.12 % MT SOLN
15.0000 mL | Freq: Once | OROMUCOSAL | Status: AC
  Administered 2024-08-09: 15 mL via OROMUCOSAL
  Filled 2024-08-09: qty 15

## 2024-08-09 MED ORDER — ONDANSETRON HCL 4 MG/2ML IJ SOLN
INTRAMUSCULAR | Status: DC | PRN
  Administered 2024-08-09: 4 mg via INTRAVENOUS

## 2024-08-09 MED ORDER — INSULIN ASPART 100 UNIT/ML IJ SOLN: 0.0000 [IU] | INTRAMUSCULAR | Status: DC | PRN

## 2024-08-09 MED ORDER — INSULIN ASPART 100 UNIT/ML IJ SOLN
0.0000 [IU] | Freq: Three times a day (TID) | INTRAMUSCULAR | Status: DC
  Administered 2024-08-09 – 2024-08-11 (×3): 1 [IU] via SUBCUTANEOUS
  Administered 2024-08-12 (×2): 2 [IU] via SUBCUTANEOUS
  Administered 2024-08-12 – 2024-08-13 (×2): 1 [IU] via SUBCUTANEOUS
  Filled 2024-08-09 (×2): qty 1

## 2024-08-09 MED ORDER — ENOXAPARIN SODIUM 40 MG/0.4ML IJ SOSY
40.0000 mg | PREFILLED_SYRINGE | INTRAMUSCULAR | Status: DC
  Administered 2024-08-10 – 2024-08-12 (×3): 40 mg via SUBCUTANEOUS
  Filled 2024-08-09 (×3): qty 0.4

## 2024-08-09 MED ORDER — ASPIRIN 81 MG PO TBEC: 81.0000 mg | DELAYED_RELEASE_TABLET | Freq: Two times a day (BID) | ORAL | Status: DC

## 2024-08-09 MED ORDER — VANCOMYCIN HCL 1000 MG IV SOLR
INTRAVENOUS | Status: AC
  Filled 2024-08-09: qty 20

## 2024-08-09 MED ORDER — CEFAZOLIN SODIUM-DEXTROSE 2-4 GM/100ML-% IV SOLN
2.0000 g | INTRAVENOUS | Status: AC
  Administered 2024-08-09: 2 g via INTRAVENOUS

## 2024-08-09 MED ORDER — METOCLOPRAMIDE HCL 5 MG PO TABS
10.0000 mg | ORAL_TABLET | Freq: Three times a day (TID) | ORAL | Status: DC | PRN
  Administered 2024-08-12: 10 mg via ORAL
  Filled 2024-08-09: qty 2

## 2024-08-09 MED ORDER — LIDOCAINE 2% (20 MG/ML) 5 ML SYRINGE: INTRAMUSCULAR | Status: DC | PRN

## 2024-08-09 MED ORDER — FENTANYL CITRATE (PF) 250 MCG/5ML IJ SOLN
INTRAMUSCULAR | Status: AC
  Filled 2024-08-09: qty 5

## 2024-08-09 SURGICAL SUPPLY — 41 items
BAG COUNTER SPONGE SURGICOUNT (BAG) ×1 IMPLANT
BLADE SURG 21 STRL SS (BLADE) IMPLANT
BNDG COHESIVE 4X5 TAN STRL LF (GAUZE/BANDAGES/DRESSINGS) IMPLANT
BNDG COMPR ESMARK 6X3 LF (GAUZE/BANDAGES/DRESSINGS) IMPLANT
BNDG GAUZE DERMACEA FLUFF 4 (GAUZE/BANDAGES/DRESSINGS) ×1 IMPLANT
CNTNR URN SCR LID CUP LEK RST (MISCELLANEOUS) IMPLANT
COVER SURGICAL LIGHT HANDLE (MISCELLANEOUS) ×2 IMPLANT
CUFF TOURN SGL QUICK 42 (TOURNIQUET CUFF) IMPLANT
CUFF TRNQT CYL 34X4.125X (TOURNIQUET CUFF) IMPLANT
DRAPE C-ARM 42X72 X-RAY (DRAPES) IMPLANT
DRAPE INCISE IOBAN 66X45 STRL (DRAPES) IMPLANT
DRAPE SURG ORHT 6 SPLT 77X108 (DRAPES) IMPLANT
DRAPE U-SHAPE 47X51 STRL (DRAPES) ×1 IMPLANT
DRSG EMULSION OIL 3X3 NADH (GAUZE/BANDAGES/DRESSINGS) ×1 IMPLANT
DRSG VAC PEEL AND PLACE LRG (GAUZE/BANDAGES/DRESSINGS) IMPLANT
DURAPREP 26ML APPLICATOR (WOUND CARE) ×1 IMPLANT
ELECTRODE REM PT RTRN 9FT ADLT (ELECTROSURGICAL) ×1 IMPLANT
GAUZE PAD ABD 8X10 STRL (GAUZE/BANDAGES/DRESSINGS) ×1 IMPLANT
GAUZE SPONGE 4X4 12PLY STRL (GAUZE/BANDAGES/DRESSINGS) ×1 IMPLANT
GLOVE BIOGEL PI IND STRL 9 (GLOVE) ×1 IMPLANT
GLOVE SURG ORTHO 9.0 STRL STRW (GLOVE) ×1 IMPLANT
GOWN STRL REUS W/ TWL XL LVL3 (GOWN DISPOSABLE) ×3 IMPLANT
GRAFT SKIN WND MARIGEN OMEGA3 (Tissue) IMPLANT
KIT BASIN OR (CUSTOM PROCEDURE TRAY) ×1 IMPLANT
KIT DRSG PREVENA PLUS 7DAY 125 (MISCELLANEOUS) IMPLANT
KIT TURNOVER KIT B (KITS) ×1 IMPLANT
MANIFOLD NEPTUNE II (INSTRUMENTS) ×1 IMPLANT
PACK ORTHO EXTREMITY (CUSTOM PROCEDURE TRAY) ×1 IMPLANT
PAD ARMBOARD POSITIONER FOAM (MISCELLANEOUS) ×2 IMPLANT
PENCIL BUTTON HOLSTER BLD 10FT (ELECTRODE) IMPLANT
SOLN 0.9% NACL POUR BTL 1000ML (IV SOLUTION) ×1 IMPLANT
SOLN STERILE WATER BTL 1000 ML (IV SOLUTION) ×1 IMPLANT
SPONGE T-LAP 18X18 ~~LOC~~+RFID (SPONGE) IMPLANT
STAPLER SKIN PROX 35W (STAPLE) IMPLANT
STOCKINETTE IMPERVIOUS 9X36 MD (GAUZE/BANDAGES/DRESSINGS) IMPLANT
SUT ETHILON 2 0 PSLX (SUTURE) IMPLANT
SUT VIC AB 0 CT1 27XBRD ANBCTR (SUTURE) IMPLANT
SUT VIC AB 2-0 CT1 TAPERPNT 27 (SUTURE) IMPLANT
TOWEL GREEN STERILE (TOWEL DISPOSABLE) ×1 IMPLANT
TOWEL GREEN STERILE FF (TOWEL DISPOSABLE) ×1 IMPLANT
UNDERPAD 30X36 HEAVY ABSORB (UNDERPADS AND DIAPERS) ×1 IMPLANT

## 2024-08-09 NOTE — H&P (Signed)
 Kara Kelley is an 68 y.o. female.   Chief Complaint:  HPI: 68 y/o female with infected hardware s/p ORIF left displaced bimalleolar fracture of left ankle, Syndesmotic disruption of left ankle by Dr. Velinda Chancy on 07/02/24.  She has developed an ulcer with infection.  Past Medical History:  Diagnosis Date   Anemia    Anxiety    Arthritis    Asthma    Bacterial vaginosis 05/16/2022   Bunion    Callus    Candida vaginitis 09/16/2022   Cholelithiasis without obstruction 04/11/2022   Chronic pain    Cocaine abuse (HCC)    COPD (chronic obstructive pulmonary disease) (HCC)    Corns and callosities    Degenerative joint disease    Depression    Diabetes mellitus    Endometrial polyp    ETOH abuse    Gall stones    Gallbladder calculus with acute cholecystitis and no obstruction 04/11/2022   GERD (gastroesophageal reflux disease)    Headache    history of Migraines   Hepatitis C    Hep C   History of adenomatous polyp of colon 04/11/2022   History of endometrial ablation 02/06/2013   Hyperlipidemia    Hypertension    Insomnia    Spondylolisthesis of lumbar region    Substance abuse (HCC)    alcoholism   Tuberculosis 1985   Wears dentures    Wears glasses     Past Surgical History:  Procedure Laterality Date   ANTERIOR CERVICAL DECOMP/DISCECTOMY FUSION N/A 10/26/2021   Procedure: ACDF - C4-C5 - C5-C6 - C6-C7;  Surgeon: Louis Shove, MD;  Location: [facility];  Service: Neurosurgery;  Laterality: N/A;   BACK SURGERY  2018   CESAREAN SECTION     x3   CHOLECYSTECTOMY N/A 08/11/2016   Procedure: LAPAROSCOPIC CHOLECYSTECTOMY WITH   INTRAOPERATIVE CHOLANGIOGRAM;  Surgeon: Jina Nephew, MD;  Location: [facility];  Service: General;  Laterality: N/A;   COLONOSCOPY     Cotton Osteotomy w/ Graft Left 06/18/2009   Excision of Benign Lesion Right 01/30/2013   Rt Plantar   FOOT SURGERY     HAMMER TOE REPAIR Right 02/12/2016   RIGHT #5   Hammertoe Repair Left 06/18/2009   Lt #5    HYSTEROSCOPY N/A 06/20/2017   Procedure: DILATION AND CURETTAGE, HYSTEROSCOPY w/ Polypectomy;  Surgeon: Corene Coy, MD;  Location: [facility];  Service: Gynecology;  Laterality: N/A;   LUMBAR FUSION  2018   METATARSAL OSTEOTOMY Left 06/18/2009   #5   MULTIPLE TOOTH EXTRACTIONS     Nail Matrixectomy Left 06/18/2009   LT #1   ORIF ANKLE FRACTURE Left 07/02/2024   Procedure: OPEN REDUCTION INTERNAL FIXATION (ORIF) ANKLE FRACTURE;  Surgeon: Chancy Evalene BIRCH, MD;  Location: [facility];  Service: Orthopedics;  Laterality: Left;   OSTEOTOMY Right 01/30/2013   Rt #5   Phalangectomy Left 06/18/2009   LT #1   Phalangectomy Right 01/30/2013   Rt #1   TUBAL LIGATION      Family History  Problem Relation Age of Onset   Heart disease Mother    Diabetes Other        mat great aunt   Cirrhosis Other        mat great aunt   Cirrhosis Other        mat great uncles x 2   Colon cancer Neg Hx    Breast cancer Neg Hx    Social History:  reports that she quit smoking  about 5 years ago. Her smoking use included cigarettes. She started smoking about 52 years ago. She has a 35.3 pack-year smoking history. She has been exposed to tobacco smoke. She has never used smokeless tobacco. She reports that she does not drink alcohol and does not use drugs.  Allergies: No Known Allergies  Medications Prior to Admission  Medication Sig Dispense Refill   acetaminophen  (TYLENOL  8 HOUR) 650 MG CR tablet Take 1,300 mg by mouth every 8 (eight) hours as needed for pain.     alendronate  (FOSAMAX ) 10 MG tablet Take 1 tablet (10 mg total) by mouth daily before breakfast. Take with a full glass of water on an empty stomach. 30 tablet 11   cyclobenzaprine  (FLEXERIL ) 10 MG tablet Take 10 mg by mouth daily.     DULoxetine  (CYMBALTA ) 60 MG capsule Take 1 capsule (60 mg total) by mouth daily. (Patient taking differently: Take 60 mg by mouth 2 (two) times daily.) 90 capsule 3   empagliflozin  (JARDIANCE ) 10 MG  TABS tablet Take 1 tablet (10 mg total) by mouth daily. 30 tablet 11   fluticasone -salmeterol (ADVAIR) 250-50 MCG/ACT AEPB INHALE 1 PUFF BY MOUTH INTO LUNGS TWICE DAILY IN THE MORNING AND AT BEDTIME 60 each 2   HYDROcodone -acetaminophen  (NORCO) 10-325 MG tablet Take 1 tablet by mouth 4 (four) times daily as needed for moderate pain (pain score 4-6).     insulin  lispro (HUMALOG  KWIKPEN) 100 UNIT/ML KwikPen ADMINISTER 5 UNITS UNDER THE SKIN THREE TIMES DAILY WITH MEALS (Patient taking differently: Inject 15 Units into the skin 3 (three) times daily before meals.) 3 mL 3   linaclotide  (LINZESS ) 290 MCG CAPS capsule Take 290 mcg by mouth daily as needed (Constipation).     losartan -hydrochlorothiazide (HYZAAR) 100-25 MG tablet Take 1 tablet by mouth daily. 90 tablet 3   meloxicam  (MOBIC ) 15 MG tablet Take 1 tablet (15 mg total) by mouth daily as needed for pain (and inflammation). 30 tablet 0   metFORMIN  (GLUCOPHAGE -XR) 500 MG 24 hr tablet Take 2 tablets (1,000 mg total) by mouth 2 (two) times daily with a meal. 120 tablet 11   metoCLOPramide  (REGLAN ) 10 MG tablet Take 1 tablet (10 mg total) by mouth every 8 (eight) hours as needed for nausea. 30 tablet 0   mineral oil-hydrophilic petrolatum  (AQUAPHOR) ointment Apply topically as needed for dry skin. 420 g 0   omeprazole  (PRILOSEC) 40 MG capsule TAKE 1 CAPSULE(40 MG) BY MOUTH Twice DAILY BEFORE BREAKFAST (Patient taking differently: Take 40 mg by mouth daily as needed (Heartburn).) 90 capsule 3   Semaglutide , 2 MG/DOSE, (OZEMPIC , 2 MG/DOSE,) 8 MG/3ML SOPN Inject 2 mg of amoxicillin into the skin once a week.     traZODone  (DESYREL ) 50 MG tablet Take 1 tablet (50 mg total) by mouth at bedtime as needed for sleep. (Patient taking differently: Take 150 mg by mouth at bedtime.) 90 tablet 0   TRESIBA  FLEXTOUCH 100 UNIT/ML FlexTouch Pen ADMINISTER 15 UNITS UNDER THE SKIN TWICE DAILY (Patient taking differently: Inject 100 Units into the skin at bedtime.) 27 mL 1    Accu-Chek FastClix Lancets MISC Use as directed to check blood sugars 2 times per day dx: e11.65 102 each prn   Accu-Chek Softclix Lancets lancets Use as instructed to check blood sugars twice daily E11.69 100 each 2   albuterol  (VENTOLIN  HFA) 108 (90 Base) MCG/ACT inhaler Inhale 1-2 puffs into the lungs every 6 (six) hours as needed for wheezing or shortness of breath. 18 g 2  aspirin  EC 81 MG tablet Take 1 tablet (81 mg total) by mouth 2 (two) times daily. To prevent blood clots for 30 days after surgery. (Patient not taking: Reported on 08/08/2024) 60 tablet 0   atorvastatin  (LIPITOR) 10 MG tablet Take 1 tablet (10 mg total) by mouth daily. 30 tablet 11   Blood Glucose Monitoring Suppl (ONETOUCH VERIO REFLECT) w/Device KIT Use to check blood sugar up to 3 times a day 1 kit 0   buPROPion  (WELLBUTRIN  XL) 300 MG 24 hr tablet Take 1 tablet (300 mg total) by mouth in the morning and at bedtime. (Patient not taking: Reported on 08/08/2024) 180 tablet 1   Continuous Glucose Receiver (FREESTYLE LIBRE 3 READER) DEVI Use to check your blood sugar continuously. (Patient not taking: No sig reported) 1 each 0   Continuous Glucose Sensor (FREESTYLE LIBRE 3 PLUS SENSOR) MISC Change sensor every 15 days. Use to check your blood sugar continuously. (Patient not taking: Reported on 07/08/2024) 2 each 11   diclofenac  Sodium (VOLTAREN ) 1 % GEL Apply 4 g topically 4 (four) times daily. 100 g 2   glucose blood (ONETOUCH ULTRA) test strip TEST AS DIRECTED UPTO 3 TIMES DAILY 100 strip 1   Insulin  Pen Needle (PEN NEEDLES) 32G X 4 MM MISC Use new pen needle for each insulin  injection up to 4 times a day. 200 each 5   Lancet Device MISC 1 each by Does not apply route in the morning, at noon, and at bedtime. May substitute to any manufacturer covered by patient's insurance. 100 each 0   lidocaine  (LIDODERM ) 5 % Place 1 patch onto the skin daily. Remove & Discard patch within 12 hours or as directed by MD (Patient not taking:  Reported on 08/08/2024) 30 patch 0   ondansetron  (ZOFRAN -ODT) 4 MG disintegrating tablet Take 1 tablet (4 mg total) by mouth every 8 (eight) hours as needed for nausea or vomiting. (Patient not taking: Reported on 08/08/2024) 15 tablet 0   oxyCODONE  (ROXICODONE ) 5 MG immediate release tablet Take 1 tablet (5 mg total) by mouth 2 (two) times daily as needed for severe pain (pain score 7-10) or breakthrough pain. in the ankle after surgery for a fracture that is not resolved by first taking your normal daily dose of Hydrocodone  for chronic pain (Patient not taking: Reported on 08/08/2024) 14 tablet 0   polyethylene glycol powder (GLYCOLAX /MIRALAX ) 17 GM/SCOOP powder Take 17 g by mouth 2 (two) times daily. Until daily soft stools OTC (Patient not taking: Reported on 08/08/2024) 238 g 0    Results for orders placed or performed during the hospital encounter of 08/09/24 (from the past 48 hours)  CBC per protocol     Status: Abnormal   Collection Time: 08/09/24 10:42 AM  Result Value Ref Range   WBC 5.4 4.0 - 10.5 K/uL   RBC 4.19 3.87 - 5.11 MIL/uL   Hemoglobin 10.4 (L) 12.0 - 15.0 g/dL   HCT 67.1 (L) 63.9 - 53.9 %   MCV 78.3 (L) 80.0 - 100.0 fL   MCH 24.8 (L) 26.0 - 34.0 pg   MCHC 31.7 30.0 - 36.0 g/dL   RDW 83.0 (H) 88.4 - 84.4 %   Platelets 192 150 - 400 K/uL   nRBC 0.0 0.0 - 0.2 %    Comment: Performed at [facility], [address], [place], [place] [address]  Glucose, capillary     Status: Abnormal   Collection Time: 08/09/24 10:43 AM  Result Value Ref Range  Glucose-Capillary 117 (H) 70 - 99 mg/dL    Comment: Glucose reference range applies only to samples taken after fasting for at least 8 hours.   *Note: Due to a large number of results and/or encounters for the requested time period, some results have not been displayed. A complete set of results can be found in Results Review.   No results found.  Review of Systems  All other systems reviewed and are negative.   Blood  pressure (!) 170/72, pulse 70, temperature 97.9 F (36.6 C), temperature source Oral, resp. rate 20, height 4' 11 (1.499 m), weight 98 kg, SpO2 100%. Physical Exam  Lungs non labored breathing Palpable pedal pulses Ulcer over hardware probes to bone General: Alert, no acute distress Cardiovascular: No pedal edema Respiratory: No cyanosis, no use of accessory musculature GI: No organomegaly, abdomen is soft and non-tender Skin: No lesions in the area of chief complaint Neurologic: Sensation intact distally Psychiatric: Patient is competent for consent with normal mood and affect Lymphatic: No axillary or cervical lymphadenopathy  ulcer probes to bone on exam.  Assessment/Plan Removal of infected hardware left ankle with irrigation and debridement of the ankle by Dr. Harden on 08/09/24.       Maurilio Deland Collet, PA-C 08/09/2024, 11:49 AM

## 2024-08-09 NOTE — Op Note (Signed)
 08/09/2024  2:30 PM  PATIENT:  Kara Kelley    PRE-OPERATIVE DIAGNOSIS:  Infected Left  Ankle Hardware  POST-OPERATIVE DIAGNOSIS:  Same  PROCEDURE: Removal of deep retained hardware including interfrag screw plate and associated screws and the syndesmosis tight rope. Application of Kerecis micro graft 19 cm and 1 g vancomycin  powder. Wound tissue sent for cultures. Local tissue transfer for wound closure 10 x 4 cm.  SURGEON:  Jerona GAILS Sage, MD  PHYSICIAN ASSISTANT:None ANESTHESIA:   General  PREOPERATIVE INDICATIONS:  Kara Kelley is a  68 y.o. female with a diagnosis of Infected Left  Ankle Hardware who failed conservative measures and elected for surgical management.    The risks benefits and alternatives were discussed with the patient preoperatively including but not limited to the risks of infection, bleeding, nerve injury, cardiopulmonary complications, the need for revision surgery, among others, and the patient was willing to proceed.  OPERATIVE IMPLANTS:   Implant Name Type Inv. Item Serial No. Manufacturer Lot No. LRB No. Used Action  GRAFT SKIN WND MARIGEN OMEGA3 - [id number] Tissue GRAFT SKIN WND MARIGEN OMEGA3  [employer] [id number] Left 1 Implanted    @ENCIMAGES @  OPERATIVE FINDINGS: Tissue margins were clear.  Tissue sent for cultures.  Wound filled with 19 cm Kerecis micro graft and 1 g vancomycin  powder.  OPERATIVE PROCEDURE: Patient was brought the operating room and underwent general anesthetic.  After adequate level anesthesia obtained patient's left lower extremity was prepped using DuraPrep draped into a sterile field a timeout was called.  Elliptical incision was made around the ulcerative tissue and this left the wound that was 10 x 4 cm.  Tissue margins were undermined to allow for local tissue transfer.  The plate and associated compression screws were removed without complication.  The interfrag screw was removed.  The tight rope was removed.   Partial fibular excision was performed over the surface of the fibula to get the bone back to bleeding viable petechial bone.  The wound was irrigated with Vashe electrocautery was used for hemostasis.  The wound bed was filled with 1 g vancomycin  powder and 19 cm of Kerecis micro graft to cover wound surface area greater than 40 cm.  Local tissue transfer was used to close the wound 10 x 4 cm.  The wound was covered with a peel in place wound VAC sponge this had a good suction fit patient was extubated taken the PACU in stable condition   DISCHARGE PLANNING:  Antibiotic duration: 24 hours  Weightbearing: Weightbearing as tolerated in a cam boot  Pain medication: Opioid pathway  Dressing care/ Wound VAC: Wound VAC for 1 week  Ambulatory devices: Walker  Discharge to: Anticipate discharge to home after observation  Follow-up: In the office 1 week post operative.

## 2024-08-09 NOTE — Progress Notes (Signed)
 Office Visit Note   Patient: Kara Kelley           Date of Birth: [birth year]           MRN: [id number] Visit Date: 08/08/2024              Requested by: Tobie Gaines, DO [address], [address] [place],  [place] [address] PCP: Tobie Gaines, DO  Chief Complaint  Patient presents with   Left Ankle - Wound Check    // ORIF left ankle with Dr. Beverley possible infection       HPI: Discussed the use of AI scribe software for clinical note transcription with the patient, who gave verbal consent to proceed.  History of Present Illness Kara Kelley is a 68 year old female with diabetes who presents with an infected ankle wound post-surgery. She is accompanied by her daughter, Toy.  Five weeks ago, she underwent surgery on her ankle. Initially, the incision appeared to be closed after the stitches were removed, but it subsequently opened up. She has not been on any antibiotics since the surgery.  She has a history of diabetes, with her most recent hemoglobin A1c being 8.6. She believes her A1c may be lower now due to dietary changes, including reducing sweets and losing weight.  She has a history of tobacco use but is not currently smoking.     Assessment & Plan: Visit Diagnoses:  1. Pain in left ankle and joints of left foot   2. Hardware complicating wound infection, initial encounter     Plan: Assessment and Plan Assessment & Plan Infected left ankle hardware with exposed ulcer Infected left ankle hardware with exposed ulcer over fibular plate. Ulcer probes to bone, indicating significant infection risk. Suboptimal diabetes control and tobacco use impair healing. Radiographs show intact hardware, no gas-forming infection. - Plan for partial fibula excision and removal of tight rope and internal fixation of fibula. - Start empiric antibiotics. - Schedule surgery for tomorrow. - Obtain cultures during surgery to guide antibiotic therapy. - Assess healing status with  x-rays to determine if new hardware is needed.      Follow-Up Instructions: Return in about 2 weeks (around 08/22/2024).   Ortho Exam  Patient is alert, oriented, no adenopathy, well-dressed, normal affect, normal respiratory effort. Physical Exam CARDIOVASCULAR: Strong dorsalis pedis pulse. SKIN: Ulcer over fibular plate probes to bone. No redness, cellulitis, or odor.      Imaging: XR Ankle Complete Left Result Date: 08/09/2024 Three-view radiographs of the left foot shows retained hardware status post plate and screw internal fixation for the  fibular fracture and internal fixation for the medial malleolar fracture and a tight rope for the syndesmosis injury.  No images are attached to the encounter.  Labs: Lab Results  Component Value Date   HGBA1C 8.6 (A) 06/19/2024   HGBA1C 8.1 (A) 02/27/2024   HGBA1C 7.4 (A) 11/01/2023   ESRSEDRATE 22 03/12/2020   ESRSEDRATE 71 (H) 07/18/2017   CRP 20.1 (H) 07/17/2017   REPTSTATUS 08/26/2022 FINAL 08/25/2022   CULT (A) 08/25/2022    <10,000 COLONIES/mL INSIGNIFICANT GROWTH Performed at [facility], [address], [place], [place] [address]      Lab Results  Component Value Date   ALBUMIN 3.4 (L) 08/09/2024   ALBUMIN 4.3 06/21/2024   ALBUMIN 3.6 03/24/2024    Lab Results  Component Value Date   MG 1.6 12/21/2023   MG 1.6 (L) 08/25/2022   MG 1.7  09/24/2019   Lab Results  Component Value Date   VD25OH 53 05/15/2009    No results found for: PREALBUMIN    Latest Ref Rng & Units 08/09/2024   10:42 AM 06/21/2024    3:11 PM 06/21/2024    2:46 PM  CBC EXTENDED  WBC 4.0 - 10.5 K/uL 5.4   8.7   RBC 3.87 - 5.11 MIL/uL 4.19   5.02   Hemoglobin 12.0 - 15.0 g/dL 89.5  86.0  87.4   HCT 36.0 - 46.0 % 32.8  41.0  38.4   Platelets 150 - 400 K/uL 192   194   NEUT# 1.7 - 7.7 K/uL   6.1   Lymph# 0.7 - 4.0 K/uL   2.2      There is no height or weight on file to calculate BMI.  Orders:  Orders Placed This  Encounter  Procedures   XR Ankle Complete Left   No orders of the defined types were placed in this encounter.    Procedures: No procedures performed  Clinical Data: No additional findings.  ROS:  All other systems negative, except as noted in the HPI. Review of Systems  Objective: Vital Signs: There were no vitals taken for this visit.  Specialty Comments:  No specialty comments available.  PMFS History: Patient Active Problem List   Diagnosis Date Noted   Itching in the vaginal area 04/18/2024   Polypharmacy 02/27/2024   Hyperlipidemia 02/27/2024   Falls, subsequent encounter 02/07/2024   Anemia 12/21/2023   Insomnia 11/22/2023   Osteoporosis 11/09/2023   Neck pain 08/23/2023   Spinal stenosis, lumbar 08/24/2022   Uncontrolled type 2 diabetes mellitus on insulin  (HCC) 04/11/2022   Morbid obesity (HCC) 04/11/2022   Polysubstance abuse (HCC) 04/11/2022   CKD stage 3b, GFR 30-44 ml/min (HCC) 04/2022   Hyperthyroidism 12/29/2021   Cervical spondylosis with myelopathy and radiculopathy 10/26/2021   Hypertensive retinopathy of both eyes 03/06/2019   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) 03/06/2019   Essential hypertension 10/05/2018   Chronic obstructive pulmonary disease (HCC) 10/05/2018   Chronic hepatitis C (HCC) 12/09/2015   Gastroesophageal reflux disease 05/22/2014   Past Medical History:  Diagnosis Date   Anemia    Anxiety    Arthritis    Asthma    Bacterial vaginosis 05/16/2022   Bunion    Callus    Candida vaginitis 09/16/2022   Cholelithiasis without obstruction 04/11/2022   Chronic pain    Cocaine abuse (HCC)    COPD (chronic obstructive pulmonary disease) (HCC)    Corns and callosities    Degenerative joint disease    Depression    Diabetes mellitus    Endometrial polyp    ETOH abuse    Gall stones    Gallbladder calculus with acute cholecystitis and no obstruction 04/11/2022   GERD (gastroesophageal reflux  disease)    Headache    history of Migraines   Hepatitis C    Hep C   History of adenomatous polyp of colon 04/11/2022   History of endometrial ablation 02/06/2013   Hyperlipidemia    Hypertension    Insomnia    Spondylolisthesis of lumbar region    Substance abuse (HCC)    alcoholism   Tuberculosis 1985   Wears dentures    Wears glasses     Family History  Problem Relation Age of Onset   Heart disease Mother    Diabetes Other        mat great aunt  Cirrhosis Other        mat great aunt   Cirrhosis Other        mat great uncles x 2   Colon cancer Neg Hx    Breast cancer Neg Hx     Past Surgical History:  Procedure Laterality Date   ANTERIOR CERVICAL DECOMP/DISCECTOMY FUSION N/A 10/26/2021   Procedure: ACDF - C4-C5 - C5-C6 - C6-C7;  Surgeon: Louis Shove, MD;  Location: [facility];  Service: Neurosurgery;  Laterality: N/A;   BACK SURGERY  2018   CESAREAN SECTION     x3   CHOLECYSTECTOMY N/A 08/11/2016   Procedure: LAPAROSCOPIC CHOLECYSTECTOMY WITH   INTRAOPERATIVE CHOLANGIOGRAM;  Surgeon: Jina Nephew, MD;  Location: [facility];  Service: General;  Laterality: N/A;   COLONOSCOPY     Cotton Osteotomy w/ Graft Left 06/18/2009   Excision of Benign Lesion Right 01/30/2013   Rt Plantar   FOOT SURGERY     HAMMER TOE REPAIR Right 02/12/2016   RIGHT #5   Hammertoe Repair Left 06/18/2009   Lt #5   HYSTEROSCOPY N/A 06/20/2017   Procedure: DILATION AND CURETTAGE, HYSTEROSCOPY w/ Polypectomy;  Surgeon: Corene Coy, MD;  Location: [facility];  Service: Gynecology;  Laterality: N/A;   LUMBAR FUSION  2018   METATARSAL OSTEOTOMY Left 06/18/2009   #5   MULTIPLE TOOTH EXTRACTIONS     Nail Matrixectomy Left 06/18/2009   LT #1   ORIF ANKLE FRACTURE Left 07/02/2024   Procedure: OPEN REDUCTION INTERNAL FIXATION (ORIF) ANKLE FRACTURE;  Surgeon: Beverley Evalene BIRCH, MD;  Location: [facility];  Service: Orthopedics;  Laterality: Left;   OSTEOTOMY Right 01/30/2013   Rt #5    Phalangectomy Left 06/18/2009   LT #1   Phalangectomy Right 01/30/2013   Rt #1   TUBAL LIGATION     Social History   Occupational History   Occupation: disablily  Tobacco Use   Smoking status: Former    Current packs/day: 0.00    Average packs/day: 0.8 packs/day for 47.0 years (35.3 ttl pk-yrs)    Types: Cigarettes    Start date: 53    Quit date: 2020    Years since quitting: 5.8    Passive exposure: Past   Smokeless tobacco: Never  Vaping Use   Vaping status: Never Used  Substance and Sexual Activity   Alcohol use: No    Comment: no ETOH 6 yrs   Drug use: No    Comment: in rehab   Sexual activity: Not Currently    Birth control/protection: Post-menopausal

## 2024-08-09 NOTE — Anesthesia Procedure Notes (Signed)
 Procedure Name: LMA Insertion Date/Time: 08/09/2024 1:29 PM  Performed by: Roslynn Waddell LABOR, CRNAPre-anesthesia Checklist: Patient identified, Emergency Drugs available, Suction available and Patient being monitored Patient Re-evaluated:Patient Re-evaluated prior to induction Oxygen Delivery Method: Circle System Utilized Preoxygenation: Pre-oxygenation with 100% oxygen Induction Type: IV induction LMA: LMA inserted LMA Size: 4.0 Number of attempts: 1 Airway Equipment and Method: Bite block Placement Confirmation: positive ETCO2 Tube secured with: Tape Dental Injury: Teeth and Oropharynx as per pre-operative assessment  Comments: Atraumatic induction/LMA insertion. Edentulous uppers. Remaining dentition and oral mucosa as per preop.

## 2024-08-09 NOTE — Progress Notes (Signed)
 Orthopedic Tech Progress Note Patient Details:  Kara Kelley 1956-08-02 [id number]  Patient ID: Kara Kelley, female   DOB: 07-13-1956, 68 y.o.   MRN: [id number] Pt. Already has CAM boot. Mya Suell K [name] 08/09/2024, 4:30 PM

## 2024-08-09 NOTE — Transfer of Care (Signed)
 Immediate Anesthesia Transfer of Care Note  Patient: Kara Kelley  Procedure(s) Performed: DEBRIDEMENT LEFT ANKLE AND REMOVAL OF HARDWARE (Left)  Patient Location: PACU  Anesthesia Type:General  Level of Consciousness: awake, alert , and oriented  Airway & Oxygen Therapy: Patient Spontanous Breathing and Patient connected to nasal cannula oxygen  Post-op Assessment: Report given to RN and Post -op Vital signs reviewed and stable  Post vital signs: Reviewed and stable  Last Vitals:  Vitals Value Taken Time  BP 161/81 08/09/24 15:57  Temp 36.4 C 08/09/24 15:57  Pulse 68 08/09/24 15:57  Resp 16 08/09/24 15:57  SpO2 100 % 08/09/24 15:57    Last Pain:  Vitals:   08/09/24 1557  TempSrc: Oral  PainSc:       Patients Stated Pain Goal: 4 (08/09/24 1530)  Complications: No notable events documented.

## 2024-08-09 NOTE — Anesthesia Preprocedure Evaluation (Addendum)
 Anesthesia Evaluation  Patient identified by MRN, date of birth, ID band Patient awake    Reviewed: Allergy & Precautions, NPO status , Patient's Chart, lab work & pertinent test results, reviewed documented beta blocker date and time   Airway Mallampati: II  TM Distance: >3 FB     Dental  (+) Edentulous Upper   Pulmonary asthma , COPD, former smoker   breath sounds clear to auscultation       Cardiovascular hypertension, (-) Past MI, (-) Cardiac Stents, (-) CABG, (-) Peripheral Vascular Disease and (-) DVT  Rhythm:Regular Rate:Normal     Neuro/Psych  Headaches PSYCHIATRIC DISORDERS Anxiety Depression     Neuromuscular disease    GI/Hepatic ,GERD  ,,(+) Hepatitis -  Endo/Other  diabetes, Type 2 Hyperthyroidism   Renal/GU Renal disease     Musculoskeletal  (+) Arthritis ,    Abdominal   Peds  Hematology  (+) Blood dyscrasia, anemia   Anesthesia Other Findings   Reproductive/Obstetrics                              Anesthesia Physical Anesthesia Plan  ASA: 3  Anesthesia Plan: General   Post-op Pain Management:    Induction: Intravenous  PONV Risk Score and Plan: 2 and Ondansetron  and Dexamethasone   Airway Management Planned: LMA  Additional Equipment:   Intra-op Plan:   Post-operative Plan: Extubation in OR  Informed Consent: I have reviewed the patients History and Physical, chart, labs and discussed the procedure including the risks, benefits and alternatives for the proposed anesthesia with the patient or authorized representative who has indicated his/her understanding and acceptance.     Dental advisory given  Plan Discussed with: CRNA  Anesthesia Plan Comments:         Anesthesia Quick Evaluation

## 2024-08-10 ENCOUNTER — Encounter (HOSPITAL_COMMUNITY): Payer: Self-pay | Admitting: Orthopedic Surgery

## 2024-08-10 DIAGNOSIS — T847XXA Infection and inflammatory reaction due to other internal orthopedic prosthetic devices, implants and grafts, initial encounter: Secondary | ICD-10-CM | POA: Diagnosis present

## 2024-08-10 DIAGNOSIS — S82842R Displaced bimalleolar fracture of left lower leg, subsequent encounter for open fracture type IIIA, IIIB, or IIIC with malunion: Secondary | ICD-10-CM | POA: Diagnosis not present

## 2024-08-10 LAB — GLUCOSE, CAPILLARY
Glucose-Capillary: 142 mg/dL — ABNORMAL HIGH (ref 70–99)
Glucose-Capillary: 69 mg/dL — ABNORMAL LOW (ref 70–99)
Glucose-Capillary: 90 mg/dL (ref 70–99)
Glucose-Capillary: 83 mg/dL (ref 70–99)
Glucose-Capillary: 102 mg/dL — ABNORMAL HIGH (ref 70–99)

## 2024-08-10 MED ORDER — TRAZODONE HCL 50 MG PO TABS
150.0000 mg | ORAL_TABLET | Freq: Every evening | ORAL | Status: DC | PRN
  Administered 2024-08-11 – 2024-08-12 (×2): 150 mg via ORAL
  Filled 2024-08-10 (×2): qty 1

## 2024-08-10 MED ORDER — INSULIN ASPART 100 UNIT/ML IJ SOLN
5.0000 [IU] | Freq: Three times a day (TID) | INTRAMUSCULAR | Status: DC
  Administered 2024-08-11 – 2024-08-13 (×6): 5 [IU] via SUBCUTANEOUS
  Filled 2024-08-10 (×3): qty 5

## 2024-08-10 NOTE — Care Management Obs Status (Signed)
 MEDICARE OBSERVATION STATUS NOTIFICATION   Patient Details  Name: Kara Kelley MRN: [id number] Date of Birth: June 06, 1956   Medicare Observation Status Notification Given:  Yes    Robynn Eileen Hoose, RN 08/10/2024, 1:56 PM

## 2024-08-10 NOTE — Progress Notes (Signed)
 Wound vac canister changed this morning. Canister volume 50ml

## 2024-08-10 NOTE — Progress Notes (Signed)
 Patient ID: Kara Kelley, female   DOB: Apr 07, 1956, 68 y.o.   MRN: [id number] Patient is postoperative day 1 removal of infected hardware placement of antibiotic and biologic tissue graft.  Initial cultures are showing no organisms.  Patient states that she now no longer has assistance at home and wants to discharge to a skilled nursing facility.  Patient needs a new canister for the Prevena plus portable wound VAC pump.  Patient may be weightbearing as tolerated in a cam boot walker on the left.

## 2024-08-10 NOTE — Progress Notes (Addendum)
Wound vac changed.

## 2024-08-10 NOTE — Progress Notes (Signed)
 PHARMACIST - PHYSICIAN COMMUNICATION  CONCERNING: P&T Medication Policy Regarding Oral Bisphosphonates  RECOMMENDATION: Your order for alendronate  (Fosamax ), ibandronate (Boniva), or risedronate (Actonel) has been discontinued at this time.  If the patient's post-hospital medical condition warrants safe use of this class of drugs, please resume the pre-hospital regimen upon discharge.  DESCRIPTION:  Alendronate  (Fosamax ), ibandronate (Boniva), and risedronate (Actonel) can cause severe esophageal erosions in patients who are unable to remain upright at least 30 minutes after taking this medication.   Since brief interruptions in therapy are thought to have minimal impact on bone mineral density, the Pharmacy & Therapeutics Committee has established that bisphosphonate orders should be routinely discontinued during hospitalization.   To override this safety policy and permit administration of Boniva, Fosamax , or Actonel in the hospital, prescribers must write "DO NOT HOLD" in the comments section when placing the order for this class of medications.  Vito Ralph, PharmD, BCPS Please see amion for complete clinical pharmacist phone list 08/10/2024 4:12 PM

## 2024-08-10 NOTE — Evaluation (Signed)
 Physical Therapy Evaluation Patient Details Name: Kara Kelley MRN: [id number] DOB: 1956-01-10 Today's Date: 08/10/2024  History of Present Illness  Patient is 68 yo female admitted on 08/09/24 for infected hardware s/p ORIF L displaced bimallelar fracture of L ankle on 07/02/24. Wound vac placed. PMH significant for DM II, hypothyroidism, cervical myelopathy, C4-C7 ACDF in 10/2021, L4-S1 decompression and fusion, anxiety, OA, COPD, depression, HTN, TB.  Clinical Impression  PTA, patient living alone in one level apartment. Independent with ambulation/ADLs. She has been primarily using manual wheelchair since her most recent fall. Patient presents today, anxious, 10/10 pain, impaired functional mobility, and generalized weakness. Patient reports she is most anxious about taking care of her wound vac upon returning home, RN notified patient would benefit from further education. PT extensively educated patient throughout session including safety with mobility upon return home and use of CAM boot for ambulation.  Patient's sister present during session, encouraging to patient. She will be helping intermittently upon return home. Patient will benefit from continued skilled acute PT services. Recommend discharge home with initial increased assistance as well as HHPT. Recommendations also for elevating L LE leg rest as patient has new wound vac.       If plan is discharge home, recommend the following: A little help with walking and/or transfers;A little help with bathing/dressing/bathroom;Help with stairs or ramp for entrance;Assistance with cooking/housework   Can travel by private vehicle        Equipment Recommendations Other (comment) (elevating leg rest for LLE)  Recommendations for Other Services       Functional Status Assessment Patient has had a recent decline in their functional status and demonstrates the ability to make significant improvements in function in a reasonable and  predictable amount of time.     Precautions / Restrictions Precautions Precautions: Fall Recall of Precautions/Restrictions: Intact Required Braces or Orthoses: Other Brace Other Brace: CAM boot LLE Restrictions Weight Bearing Restrictions Per Provider Order: Yes LLE Weight Bearing Per Provider Order: Weight bearing as tolerated      Mobility  Bed Mobility               General bed mobility comments: Not assessed. Patient seated in chair upon arrival.    Transfers Overall transfer level: Needs assistance Equipment used: Rolling walker (2 wheels) Transfers: Sit to/from Stand Sit to Stand: Supervision           General transfer comment: supervision from recliner to 2WW x 4 reps    Ambulation/Gait Ambulation/Gait assistance: Contact guard assist Gait Distance (Feet): 65 Feet Assistive device: Rolling walker (2 wheels) Gait Pattern/deviations: Step-to pattern, Decreased stride length       General Gait Details: reduced gait speed  Stairs            Wheelchair Mobility     Tilt Bed    Modified Rankin (Stroke Patients Only)       Balance Overall balance assessment: Needs assistance Sitting-balance support: Feet supported Sitting balance-Leahy Scale: Good     Standing balance support: Reliant on assistive device for balance, During functional activity Standing balance-Leahy Scale: Poor Standing balance comment: use of 2WW for standing due to limited tolerance for WB LLE                             Pertinent Vitals/Pain Pain Assessment Pain Assessment: 0-10 Pain Score: 10-Worst pain ever Pain Location: L foot/ankle Pain Descriptors / Indicators: Aching, Discomfort Pain Intervention(s): Monitored  during session, Patient requesting pain meds-RN notified (Patient declined ice)    Home Living Family/patient expects to be discharged to:: Private residence Living Arrangements: Alone Available Help at Discharge: Family;Other  (Comment) (sister) Type of Home: Apartment Home Access: Ramped entrance       Home Layout: One level Home Equipment: Wheelchair - Nurse, Children's (2 wheels);Rollator (4 wheels)      Prior Function Prior Level of Function : Independent/Modified Independent             Mobility Comments: Patient is independent at baseline, no use of AD. Since most recent fall and s/p ORIF L ankle, she has been primarily using wheelchair for mobility inside home.       Extremity/Trunk Assessment        Lower Extremity Assessment Lower Extremity Assessment: Generalized weakness    Cervical / Trunk Assessment Cervical / Trunk Assessment: Normal  Communication   Communication Communication: No apparent difficulties    Cognition Arousal: Alert Behavior During Therapy: Anxious   PT - Cognitive impairments: Memory, Attention                         Following commands: Intact       Cueing Cueing Techniques: Verbal cues, Gestural cues, Tactile cues     General Comments General comments (skin integrity, edema, etc.): Education provided to patient regarding safe mobility, fall prevention, CAM boot when OOB, monitoring LE, elevating LEs, HEP, HHPT, and safe mobility with wound vac.    Exercises     Assessment/Plan    PT Assessment Patient needs continued PT services  PT Problem List Decreased strength;Decreased balance;Decreased knowledge of precautions;Decreased activity tolerance;Decreased safety awareness;Decreased knowledge of use of DME;Pain       PT Treatment Interventions DME instruction;Therapeutic activities;Gait training;Therapeutic exercise;Patient/family education;Balance training;Functional mobility training;Wheelchair mobility training    PT Goals (Current goals can be found in the Care Plan section)  Acute Rehab PT Goals Patient Stated Goal: to return home if able PT Goal Formulation: With patient Time For Goal Achievement:  08/24/24 Potential to Achieve Goals: Good    Frequency Min 2X/week     Co-evaluation               AM-PAC PT 6 Clicks Mobility  Outcome Measure Help needed turning from your back to your side while in a flat bed without using bedrails?: A Little Help needed moving from lying on your back to sitting on the side of a flat bed without using bedrails?: A Little Help needed moving to and from a bed to a chair (including a wheelchair)?: A Little Help needed standing up from a chair using your arms (e.g., wheelchair or bedside chair)?: A Little Help needed to walk in hospital room?: A Little Help needed climbing 3-5 steps with a railing? : A Lot 6 Click Score: 17    End of Session Equipment Utilized During Treatment: Gait belt;Other (comment) (CAM boot LLE, wound vac) Activity Tolerance: Patient tolerated treatment well Patient left: in chair;with call bell/phone within reach;with family/visitor present Nurse Communication: Mobility status;Patient requests pain meds (would benefit from wound vac education) PT Visit Diagnosis: Unsteadiness on feet (R26.81);Other abnormalities of gait and mobility (R26.89);Muscle weakness (generalized) (M62.81)    Time: 8956-8887 PT Time Calculation (min) (ACUTE ONLY): 29 min   Charges:   PT Evaluation $PT Eval Moderate Complexity: 1 Mod PT Treatments $Therapeutic Activity: 8-22 mins PT General Charges $$ ACUTE PT VISIT: 1 Visit  Sherryle Iredell, PT, DPT [facility] Acute Rehabilitation Office: [phone]   Sherryle VEAR [place] 08/10/2024, 12:36 PM

## 2024-08-10 NOTE — Plan of Care (Signed)
  Problem: Pain Managment: Goal: General experience of comfort will improve and/or be controlled Outcome: Progressing   Problem: Safety: Goal: Ability to remain free from injury will improve Outcome: Progressing   Problem: Skin Integrity: Goal: Risk for impaired skin integrity will decrease Outcome: Progressing

## 2024-08-10 NOTE — Progress Notes (Signed)
 Patient complained of difficulty in sleeping, requesting for her Trazodone  to be increased to 150mg . MD made aware thru paged. Awaiting for new order.

## 2024-08-10 NOTE — Care Management CC44 (Signed)
 Condition Code 44 Documentation Completed  Patient Details  Name: Kara Kelley MRN: [id number] Date of Birth: 1956-01-07   Condition Code 44 given:  Yes Patient signature on Condition Code 44 notice:  Yes Documentation of 2 MD's agreement:  Yes Code 44 added to claim:  Yes    Robynn Eileen Hoose, RN 08/10/2024, 1:56 PM

## 2024-08-10 NOTE — Anesthesia Postprocedure Evaluation (Signed)
 Anesthesia Post Note  Patient: MAJESTIC MOLONY  Procedure(s) Performed: DEBRIDEMENT LEFT ANKLE AND REMOVAL OF HARDWARE (Left)     Patient location during evaluation: PACU Anesthesia Type: General Level of consciousness: awake and alert Pain management: pain level controlled Vital Signs Assessment: post-procedure vital signs reviewed and stable Respiratory status: spontaneous breathing, nonlabored ventilation, respiratory function stable and patient connected to nasal cannula oxygen Cardiovascular status: blood pressure returned to baseline and stable Postop Assessment: no apparent nausea or vomiting Anesthetic complications: no   No notable events documented.  Last Vitals:  Vitals:   08/10/24 0503 08/10/24 0740  BP: 133/62   Pulse: 72 72  Resp: 18 18  Temp: 36.8 C   SpO2: 94% 95%    Last Pain:  Vitals:   08/10/24 0546  TempSrc:   PainSc: 4                  Lynwood MARLA Cornea

## 2024-08-11 DIAGNOSIS — S82842R Displaced bimalleolar fracture of left lower leg, subsequent encounter for open fracture type IIIA, IIIB, or IIIC with malunion: Secondary | ICD-10-CM | POA: Diagnosis not present

## 2024-08-11 LAB — GLUCOSE, CAPILLARY
Glucose-Capillary: 103 mg/dL — ABNORMAL HIGH (ref 70–99)
Glucose-Capillary: 127 mg/dL — ABNORMAL HIGH (ref 70–99)
Glucose-Capillary: 106 mg/dL — ABNORMAL HIGH (ref 70–99)
Glucose-Capillary: 75 mg/dL (ref 70–99)

## 2024-08-11 MED ORDER — CEPHALEXIN 500 MG PO CAPS
500.0000 mg | ORAL_CAPSULE | Freq: Three times a day (TID) | ORAL | Status: DC
Start: 2024-08-11 — End: 2024-08-18
  Administered 2024-08-11 – 2024-08-13 (×5): 500 mg via ORAL
  Filled 2024-08-11 (×6): qty 1

## 2024-08-11 NOTE — Plan of Care (Signed)
   Problem: Nutrition: Goal: Adequate nutrition will be maintained Outcome: Progressing   Problem: Pain Managment: Goal: General experience of comfort will improve and/or be controlled Outcome: Progressing   Problem: Safety: Goal: Ability to remain free from injury will improve Outcome: Progressing

## 2024-08-11 NOTE — Progress Notes (Signed)
 Patient stable.  Resting comfortably in bed.  Wound VAC is functional. Ankle dorsiflexion plantarflexion intact.  Plan for discharge to skilled nursing next week. Patient is able to ambulate in the room

## 2024-08-11 NOTE — Plan of Care (Signed)
  Problem: Education: Goal: Knowledge of General Education information will improve Description: Including pain rating scale, medication(s)/side effects and non-pharmacologic comfort measures Outcome: Progressing   Problem: Clinical Measurements: Goal: Will remain free from infection Outcome: Progressing Goal: Diagnostic test results will improve Outcome: Progressing   Problem: Health Behavior/Discharge Planning: Goal: Ability to manage health-related needs will improve Outcome: Progressing

## 2024-08-11 NOTE — Plan of Care (Signed)
  Problem: Pain Managment: Goal: General experience of comfort will improve and/or be controlled 08/11/2024 1904 by Aldean Jeoffrey CROME, RN Outcome: Progressing 08/11/2024 1903 by Aldean Jeoffrey CROME, RN Outcome: Progressing   Problem: Safety: Goal: Ability to remain free from injury will improve 08/11/2024 1904 by Aldean Jeoffrey CROME, RN Outcome: Progressing 08/11/2024 1903 by Aldean Jeoffrey CROME, RN Outcome: Progressing   Problem: Nutrition: Goal: Adequate nutrition will be maintained 08/11/2024 1904 by Aldean Jeoffrey CROME, RN Outcome: Progressing 08/11/2024 1903 by Aldean Jeoffrey CROME, RN Outcome: Progressing

## 2024-08-12 ENCOUNTER — Other Ambulatory Visit

## 2024-08-12 ENCOUNTER — Encounter: Payer: Self-pay | Admitting: Radiology

## 2024-08-12 DIAGNOSIS — S82842R Displaced bimalleolar fracture of left lower leg, subsequent encounter for open fracture type IIIA, IIIB, or IIIC with malunion: Secondary | ICD-10-CM | POA: Diagnosis not present

## 2024-08-12 LAB — GLUCOSE, CAPILLARY
Glucose-Capillary: 136 mg/dL — ABNORMAL HIGH (ref 70–99)
Glucose-Capillary: 181 mg/dL — ABNORMAL HIGH (ref 70–99)
Glucose-Capillary: 178 mg/dL — ABNORMAL HIGH (ref 70–99)
Glucose-Capillary: 74 mg/dL (ref 70–99)

## 2024-08-12 NOTE — Plan of Care (Signed)
   Problem: Health Behavior/Discharge Planning: Goal: Ability to manage health-related needs will improve Outcome: Progressing   Problem: Activity: Goal: Risk for activity intolerance will decrease Outcome: Progressing   Problem: Nutrition: Goal: Adequate nutrition will be maintained Outcome: Progressing   Problem: Coping: Goal: Level of anxiety will decrease Outcome: Progressing

## 2024-08-12 NOTE — Progress Notes (Signed)
 Patient ID: Kara Kelley, female   DOB: [birth year], 68 y.o.   MRN: [id number] Patient is making progress with therapy.  She states she will be able to discharge to her sisters for support.  Plan for discharge tomorrow.

## 2024-08-12 NOTE — TOC Initial Note (Addendum)
 Transition of Care (TOC) - Initial/Assessment Note   Patient from home alone, but at discharge plans to stay with her sister. Sister's address is [address], [place] [address]   Discussed home health PT with patient. PAtient agreeable . Choice offered. Patient has had [facility] in past and would like them again. NCM sent message to Artavia with [facility] await determination. [facility] accepted   PT also recommending elevating leg rest for patient's wheelchair at home. Elevating leg rests will need to be ordered with same company that provided the wheelchair.   Patient unsure of the company that provided her wheelchair , but states she received wheelchair at last discharge from hospital. NCM looked back through chart and [facility] and cannot find record of wheelchair. NCM called [employer] and [employer] they did not provide wheelchair. NCM went back to patient to explain. Patient called daughter. Daughter ordered wheelchair off of [employer] .   For NCM to order elevating leg rests will need to order wheelchair also . PAtient voiced understanding and does not want another wheelchair. They will look into getting elevating leg rests off [employer] to fit her current wheelchair   Patient already has prevena VAC contacted to dressing   [facility] ran insurance, they are out of network. Only agency in network is [employer]. Florence Rosella with [facility] and she accepted  Patient Details  Name: Kara Kelley MRN: [id number] Date of Birth: Jul 26, 1956  [facility] CM/SW Contact:    Stephane Powell Jansky, RN Phone Number: 08/12/2024, 12:46 PM  Clinical Narrative:                   Expected Discharge Plan: Home w Home Health Services Barriers to Discharge: Continued Medical Work up   Patient Goals and CMS Choice Patient states their goals for this hospitalization and ongoing recovery are:: to stay with sister at discharge CMS Medicare.gov Compare Post Acute Care list provided to::  Patient Choice offered to / list presented to : Patient      Expected Discharge Plan and Services   Discharge Planning Services: CM Consult Post Acute Care Choice: Home Health Living arrangements for the past 2 months: Single Family Home                 DME Arranged:  (see note)         HH Arranged: PT HH Agency: [facility] ([facility]) Date HH Agency Contacted: 08/12/24 Time HH Agency Contacted: 1244 Representative spoke with at [facility] Agency: Artavia left message  Prior Living Arrangements/Services Living arrangements for the past 2 months: Single Family Home Lives with:: Self Patient language and need for interpreter reviewed:: Yes Do you feel safe going back to the place where you live?: Yes      Need for Family Participation in Patient Care: Yes (Comment) Care giver support system in place?: Yes (comment) Current home services: DME Criminal Activity/Legal Involvement Pertinent to Current Situation/Hospitalization: No - Comment as needed  Activities of Daily Living   ADL Screening (condition at time of admission) Independently performs ADLs?: Yes (appropriate for developmental age) Is the patient deaf or have difficulty hearing?: No Does the patient have difficulty seeing, even when wearing glasses/contacts?: No Does the patient have difficulty concentrating, remembering, or making decisions?: Yes  Permission Sought/Granted   Permission granted to share information with : Yes, Verbal Permission Granted     Permission granted to share info w AGENCY: DME companies and home health agencies  Emotional Assessment Appearance:: Appears stated age Attitude/Demeanor/Rapport: Engaged Affect (typically observed): Appropriate Orientation: : Oriented to Self, Oriented to Place, Oriented to  Time, Oriented to Situation Alcohol / Substance Use: Not Applicable Psych Involvement: No (comment)  Admission diagnosis:  Infected hardware in left lower extremity,  initial encounter [T84.7XXA] Ankle wound, left, initial encounter [S91.002A] Infected hardware in left leg, initial encounter [T84.7XXA] Patient Active Problem List   Diagnosis Date Noted   Infected hardware in left leg, initial encounter 08/10/2024   Hardware complicating wound infection 08/09/2024   Ankle wound, left, initial encounter 08/09/2024   Itching in the vaginal area 04/18/2024   Polypharmacy 02/27/2024   Hyperlipidemia 02/27/2024   Falls, subsequent encounter 02/07/2024   Anemia 12/21/2023   Insomnia 11/22/2023   Osteoporosis 11/09/2023   Neck pain 08/23/2023   Spinal stenosis, lumbar 08/24/2022   Uncontrolled type 2 diabetes mellitus on insulin  (HCC) 04/11/2022   Morbid obesity (HCC) 04/11/2022   Polysubstance abuse (HCC) 04/11/2022   CKD stage 3b, GFR 30-44 ml/min (HCC) 04/2022   Hyperthyroidism 12/29/2021   Cervical spondylosis with myelopathy and radiculopathy 10/26/2021   Hypertensive retinopathy of both eyes 03/06/2019   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) 03/06/2019   Essential hypertension 10/05/2018   Chronic obstructive pulmonary disease (HCC) 10/05/2018   Chronic hepatitis C (HCC) 12/09/2015   Gastroesophageal reflux disease 05/22/2014   PCP:  Tobie Gaines, DO Pharmacy:   [facility] [phone] - [place], [place] - [address] AT [facility] [address] [place] [address] Phone: [phone] Fax: [phone]     Social Drivers of Health (SDOH) Social History: SDOH Screenings   Food Insecurity: No Food Insecurity (08/10/2024)  Housing: Low Risk  (08/09/2024)  Transportation Needs: No Transportation Needs (08/09/2024)  Utilities: Not At Risk (08/09/2024)  Alcohol Screen: Low Risk  (04/20/2023)  Depression (PHQ2-9): Low Risk  (06/19/2024)  Financial Resource Strain: Low Risk  (04/20/2023)  Physical Activity: Sufficiently Active (04/20/2023)  Social Connections: Moderately  Integrated (08/09/2024)  Stress: No Stress Concern Present (04/20/2023)  Tobacco Use: Medium Risk (08/09/2024)  Health Literacy: Adequate Health Literacy (04/20/2023)   SDOH Interventions:     Readmission Risk Interventions     No data to display

## 2024-08-12 NOTE — Progress Notes (Addendum)
 Mobility Specialist Progress Note:    08/12/24 1441  Mobility  Activity Ambulated with assistance (In hallway)  Level of Assistance Contact guard assist, steadying assist  Assistive Device Front wheel walker  Distance Ambulated (ft) 65 ft  LLE Weight Bearing Per Provider Order WBAT  Activity Response Tolerated well  Mobility Referral Yes  Mobility visit 1 Mobility  Mobility Specialist Start Time (ACUTE ONLY) 1338  Mobility Specialist Stop Time (ACUTE ONLY) 1351  Mobility Specialist Time Calculation (min) (ACUTE ONLY) 13 min   Received pt in bed and agreeable to mobility. Pt required MinG for safety. C/o LLE discomfort and SOB; SPO2 @ 95%, otherwise tolerated well. Returned to room without fault. Left pt in bed. Personal belongings and call light within reach. All needs met.  Lavanda Pollack Mobility Specialist  Please contact via [employer] or  Rehab Office [phone]

## 2024-08-12 NOTE — Progress Notes (Deleted)
 08/12/2024 Name: Kara Kelley MRN: [id number] DOB: [birth year]  No chief complaint on file.   Kara Kelley is a 68 y.o. year old female who presented for a telephone visit.   They were referred to the pharmacist by their PCP for assistance in managing diabetes and polypharmacy. PMH includes HTN, COPD, hepatitis C, GERD, T2DM with retinopathy, osteoporosis, CKD3, HLD.    Subjective: Patient was seen by PCP, Kara Blanch, DO, on 06/05/24. At last visit, BP was controlled on losartan -hydrochlorothiazide. Her last A1C was 8.1% in May, increased from 7.4%. Previously, she was transitioned from Ozempic  1 mg to Mounjaro  2.5 mg weekly, but at her most recent visit she reported not tolerating Mounjaro  and was instructed to restart Ozempic  at 0.25 mg weekly. She was instructed to continue Tresiba , Humalog , and metformin  as previously prescribed. She was outreached by pharmacy via telephone on 04/19/24, but outreach was unsuccessful. Her pharmacy appt has been rescheduled multiple times. She was seen by Dr. Harrie on 06/19/24. A1C increased to 8.6%. She was instructed to titrate Ozempic . eGFR has remained < 45 mL/min on past 2 BMPs. She had a fall on 06/21/24 with L ankle fracture, and underwent surgery on 07/02/24. She was engaged by pharmacy via telephone on 07/08/24. She was instructed to start Ozempic  at 0.25 mg x4 weeks, then increase to 0.5 mg weekly. She reported her daughter would be able to help her set up CGM monitoring. She came to [facility] to see Dr. Blanch on 07/24/24, reporting worsened abdominal pain and nausea/vomiting. Also reported 2-3 mo hx of dysphagia. Ozempic  was discontinued and omeprazole  was increased to 40 mg twice daily. She was also referred to GI for an endoscopy.  Clarify insulin  dosing - may need to increase since discontinued Ozempic  Revisit CGM Increase statin  Today, patient reports she is not doing well. She is recovering from her surgery and is in a lot of pain. Her daughter is  not home, and she reports that her daughter helps manage her medications. When I spoke with her daughter earlier, she preferred that I call Kara Kelley for the appointment. While we were unable to review her full medication list, she was agreeable to review her diabetes medications.    Care Team: Primary Care Provider: Doyal Miyamoto, MD on 09/20/24   Medication Access/Adherence  Current Pharmacy:  [facility] [phone] - [place], [place] - [address] AT [facility] [address] [place] [address] Phone: [phone] Fax: [phone]   Patient reports affordability concerns with their medications: No  Patient reports access/transportation concerns to their pharmacy: Yes  - Daughter usually takes her to [facility] to pick up her medications Patient reports adherence concerns with their medications:  Yes  - patient unable to recall insulin  doses, reports her daughter Kara Kelley) usually helps give her the medications. Reports she has not started Ozempic  yet.   Diabetes:  Current medications: Tresiba  15 units BID (states she takes 20 units daily), Humalog  5 units TID with meals (reports she is taking), metformin  XR 1000 mg BID (reports she is taking one tablet with breakfast, then states she actually is taking 2 tablets twice a day), Jardiance  10 mg daily  Medications tried in the past: Mounjaro  - GI AE, Ozempic  - GI AE (worsened dysphagia, abdominal pain, n/v)  Current glucose readings: Reports that she is not currently checking. Has One Touch meter at home. Also has Freestyle Libre sensors that she has not set up yet.  Reports she has an Android phone, and she is going to see if her daughter can help her set these up.   Patient denies hypoglycemic s/sx including dizziness, shakiness, sweating. Patient denies hyperglycemic symptoms including polyuria, polydipsia, polyphagia, nocturia, neuropathy, blurred vision.  Current meal patterns: did not  discuss in depth today  Current physical activity: limited by ankle fracture currently (cannot walk at all) - has f/u on 07/12/24  Current medication access support: [employer] Dual Complete  Hypertension:  Current medications:  losartan -hydrochlorothiazide 100-25 mg daily  Hyperlipidemia/ASCVD Risk Reduction  Current lipid lowering medications: atorvastatin  10 mg daily  Antiplatelet regimen: aspirin  81 mg daily  ASCVD History: none known Family History: heart disease in mother Risk Factors: HTN, T2DM, LDL > 100 mg/dL, former smoker  Clinical ASCVD: No  The 10-year ASCVD risk score (Arnett DK, et al., 2019) is: 24.1%   Values used to calculate the score:     Age: 42 years     Clincally relevant sex: Female     Is Non-Hispanic African American: Yes     Diabetic: Yes     Tobacco smoker: No     Systolic Blood Pressure: 140 mmHg     Is BP treated: Yes     HDL Cholesterol: 58 mg/dL     Total Cholesterol: 184 mg/dL    Objective:  BP Readings from Last 3 Encounters:  08/12/24 (!) 140/81  07/24/24 (!) 142/82  07/02/24 (!) 147/72    Lab Results  Component Value Date   HGBA1C 8.6 (A) 06/19/2024   HGBA1C 8.1 (A) 02/27/2024   HGBA1C 7.4 (A) 11/01/2023       Latest Ref Rng & Units 08/09/2024   10:42 AM 06/21/2024    3:11 PM 06/21/2024    2:46 PM  BMP  Glucose 70 - 99 mg/dL 897  863  864   BUN 8 - 23 mg/dL 6  11  10    Creatinine 0.44 - 1.00 mg/dL 9.10  8.59  8.64   Sodium 135 - 145 mmol/L 139  137  134   Potassium 3.5 - 5.1 mmol/L 4.3  3.7  3.6   Chloride 98 - 111 mmol/L 108  100  98   CO2 22 - 32 mmol/L 19   22   Calcium  8.9 - 10.3 mg/dL 8.2   9.3     Lab Results  Component Value Date   CHOL 184 06/19/2024   HDL 58 06/19/2024   LDLCALC 111 (H) 06/19/2024   TRIG 84 06/19/2024   CHOLHDL 3.2 06/19/2024    Medications Reviewed Today   Medications were not reviewed in this encounter    Audit from Current Admission     Reviewed by Kelley, Greta, RN (Registered  Nurse) on 08/09/24 at 1051  Med List Status: Complete   Medication Order Taking? Sig Documenting Provider Last Dose Status Informant  Accu-Chek FastClix Lancets MISC [id number]  Use as directed to check blood sugars 2 times per day dx: e11.65 Tobie Gaines, DO  Active Child  Accu-Chek Softclix Lancets lancets [id number]  Use as instructed to check blood sugars twice daily E11.69 Tobie Gaines, DO  Active Child  acetaminophen  (TYLENOL  8 HOUR) 650 MG CR tablet [id number] Yes Take 1,300 mg by mouth every 8 (eight) hours as needed for pain. [provider] 08/08/2024 Active Child  albuterol  (VENTOLIN  HFA) 108 (90 Base) MCG/ACT inhaler [id number] No Inhale 1-2 puffs into the lungs every 6 (six) hours as needed for wheezing or shortness of breath. Kara Carrier  B, MD More than a month Active Child  alendronate  (FOSAMAX ) 10 MG tablet [id number] Yes Take 1 tablet (10 mg total) by mouth daily before breakfast. Take with a full glass of water on an empty stomach. Tobie Gaines, DO Unknown Active Child  aspirin  EC 81 MG tablet [id number] No Take 1 tablet (81 mg total) by mouth 2 (two) times daily. To prevent blood clots for 30 days after surgery.  Patient not taking: Reported on 08/08/2024   Ted Gerard HERO, PA-C Not Taking Active Child  atorvastatin  (LIPITOR) 10 MG tablet [id number] No Take 1 tablet (10 mg total) by mouth daily. Tobie Gaines, DO More than a month Active Child  Blood Glucose Monitoring Suppl (ONETOUCH VERIO REFLECT) w/Device KIT [id number]  Use to check blood sugar up to 3 times a day Atway, Rayann N, DO  Active Child  buPROPion  (WELLBUTRIN  XL) 300 MG 24 hr tablet [id number] No Take 1 tablet (300 mg total) by mouth in the morning and at bedtime.  Patient not taking: Reported on 08/08/2024   Tobie Gaines, DO Not Taking Active Child  Continuous Glucose Receiver (FREESTYLE LIBRE 3 READER) DEVI [id number]  Use to check your blood sugar continuously.  Patient not taking: No sig reported   Elicia Sharper, DO  Active Child  Continuous Glucose Sensor (FREESTYLE LIBRE 3 PLUS SENSOR) MISC [id number]  Change sensor every 15 days. Use to check your blood sugar continuously.  Patient not taking: Reported on 07/08/2024   Zheng, Michael, DO  Active Child  cyclobenzaprine  (FLEXERIL ) 10 MG tablet [id number] Yes Take 10 mg by mouth daily. [provider] Past Month Active Child  diclofenac  Sodium (VOLTAREN ) 1 % GEL [id number] No Apply 4 g topically 4 (four) times daily. Heddy Barren, DO More than a month Active Child  DULoxetine  (CYMBALTA ) 60 MG capsule [id number] Yes Take 1 capsule (60 mg total) by mouth daily.  Patient taking differently: Take 60 mg by mouth 2 (two) times daily.   Tobie Gaines, DO 08/08/2024 Active Child  empagliflozin  (JARDIANCE ) 10 MG TABS tablet [id number] Yes Take 1 tablet (10 mg total) by mouth daily. Tobie Gaines, DO 08/08/2024 Active Child  fluticasone -salmeterol (ADVAIR) 250-50 MCG/ACT AEPB [id number] Yes INHALE 1 PUFF BY MOUTH INTO LUNGS TWICE DAILY IN THE MORNING AND AT BEDTIME Kara Dorn NOVAK, MD 08/08/2024 Active Child  glucose blood (ONETOUCH ULTRA) test strip [id number]  TEST AS DIRECTED UPTO 3 TIMES DAILY Tobie Gaines, DO  Active Child  HYDROcodone -acetaminophen  (NORCO) 10-325 MG tablet [id number] Yes Take 1 tablet by mouth 4 (four) times daily as needed for moderate pain (pain score 4-6). [provider] 08/09/2024  8:30 AM Active Child  insulin  lispro (HUMALOG  KWIKPEN) 100 UNIT/ML KwikPen [id number] Yes ADMINISTER 5 UNITS UNDER THE SKIN THREE TIMES DAILY WITH MEALS  Patient taking differently: Inject 15 Units into the skin 3 (three) times daily before meals.   Tobie Gaines, DO 08/08/2024 Active Child  Insulin  Pen Needle (PEN NEEDLES) 32G X 4 MM MISC [id number]  Use new pen needle for each insulin  injection up to 4 times a day. Elicia Sharper, DO  Active Child  Lancet Device MISC [id number]  1 each by Does not apply route in the morning, at noon, and at  bedtime. May substitute to any manufacturer covered by patient's insurance. Lorren Greig PARAS, NP  Active Child  lidocaine  (LIDODERM ) 5 % [id number] No Place 1 patch onto the skin daily. Remove & Discard patch within 12 hours or as directed by MD  Patient not  taking: Reported on 08/08/2024   Heddy Barren, DO Not Taking Active Child  linaclotide  (LINZESS ) 290 MCG CAPS capsule [id number] Yes Take 290 mcg by mouth daily as needed (Constipation). [provider] 08/07/2024 Active Self, Child  losartan -hydrochlorothiazide (HYZAAR) 100-25 MG tablet [id number] Yes Take 1 tablet by mouth daily. Tobie Gaines, DO 08/08/2024 Active Child  meloxicam  (MOBIC ) 15 MG tablet [id number] Yes Take 1 tablet (15 mg total) by mouth daily as needed for pain (and inflammation). Gawne, Meghan M, PA-C Past Week Active Child  metFORMIN  (GLUCOPHAGE -XR) 500 MG 24 hr tablet [id number] Yes Take 2 tablets (1,000 mg total) by mouth 2 (two) times daily with a meal. Tobie Gaines, DO 08/08/2024 Active Child  metoCLOPramide  (REGLAN ) 10 MG tablet [id number] Yes Take 1 tablet (10 mg total) by mouth every 8 (eight) hours as needed for nausea. Tobie Gaines, DO 08/08/2024 Active Child  mineral oil-hydrophilic petrolatum  (AQUAPHOR) ointment [id number] Yes Apply topically as needed for dry skin. Azadegan, Maryam, MD  Active Child           Med Note CHRISTIE ALYSON Schaumann Aug 08, 2024  4:18 PM) Ran out  omeprazole  (PRILOSEC) 40 MG capsule [id number] Yes TAKE 1 CAPSULE(40 MG) BY MOUTH Twice DAILY BEFORE BREAKFAST  Patient taking differently: Take 40 mg by mouth daily as needed (Heartburn).   Tobie Gaines, DO 08/08/2024 Active Child  ondansetron  (ZOFRAN -ODT) 4 MG disintegrating tablet [id number] No Take 1 tablet (4 mg total) by mouth every 8 (eight) hours as needed for nausea or vomiting.  Patient not taking: Reported on 08/08/2024   Ted Sayres M, PA-C Not Taking Active Child  oxyCODONE  (ROXICODONE ) 5 MG immediate release tablet [id number] No  Take 1 tablet (5 mg total) by mouth 2 (two) times daily as needed for severe pain (pain score 7-10) or breakthrough pain. in the ankle after surgery for a fracture that is not resolved by first taking your normal daily dose of Hydrocodone  for chronic pain  Patient not taking: Reported on 08/08/2024   Gawne, Meghan M, PA-C Not Taking Active Child  polyethylene glycol powder (GLYCOLAX /MIRALAX ) 17 GM/SCOOP powder [id number] No Take 17 g by mouth 2 (two) times daily. Until daily soft stools OTC  Patient not taking: Reported on 08/08/2024   Vicky Charleston, PA-C Not Taking Active Child  Semaglutide , 2 MG/DOSE, (OZEMPIC , 2 MG/DOSE,) 8 MG/3ML SOPN [id number] Yes Inject 2 mg of amoxicillin into the skin once a week. [provider]  Active Child           Med Note VERLENA, Kara   Fri Aug 09, 2024 10:51 AM) Not taking  traZODone  (DESYREL ) 50 MG tablet [id number] Yes Take 1 tablet (50 mg total) by mouth at bedtime as needed for sleep.  Patient taking differently: Take 150 mg by mouth at bedtime.   Tobie Gaines, DO 08/08/2024 Active Child  TRESIBA  FLEXTOUCH 100 UNIT/ML FlexTouch Pen [id number] Yes ADMINISTER 15 UNITS UNDER THE SKIN TWICE DAILY  Patient taking differently: Inject 100 Units into the skin at bedtime.   Tobie Gaines, DO 08/08/2024 Active Child         Reviewed by Horton, Jeannetta, CPhT (Pharmacy Technician) on 08/08/24 at 1621  Med List Status: Complete   Medication Order Taking? Sig Documenting Provider Last Dose Status Informant  Accu-Chek FastClix Lancets MISC [id number]  Use as directed to check blood sugars 2 times per day dx: e11.65 Tobie Gaines, DO  Active Child  Accu-Chek Softclix Lancets lancets [id number]  Use as instructed to check blood  sugars twice daily E11.69 Tobie Gaines, DO  Active Child  acetaminophen  (TYLENOL  8 HOUR) 650 MG CR tablet [id number] Yes Take 1,300 mg by mouth every 8 (eight) hours as needed for pain. [provider]  Active Child  albuterol   (VENTOLIN  HFA) 108 (90 Base) MCG/ACT inhaler [id number] Yes Inhale 1-2 puffs into the lungs every 6 (six) hours as needed for wheezing or shortness of breath. Kara Dorn NOVAK, MD  Active Child  alendronate  (FOSAMAX ) 10 MG tablet [id number] Yes Take 1 tablet (10 mg total) by mouth daily before breakfast. Take with a full glass of water on an empty stomach. Tobie Gaines, DO  Active Child  aspirin  EC 81 MG tablet [id number] No Take 1 tablet (81 mg total) by mouth 2 (two) times daily. To prevent blood clots for 30 days after surgery.  Patient not taking: Reported on 08/08/2024   Ted Gerard HERO, PA-C Not Taking Active Child  atorvastatin  (LIPITOR) 10 MG tablet [id number] Yes Take 1 tablet (10 mg total) by mouth daily. Tobie Gaines, DO  Active Child  Blood Glucose Monitoring Suppl (ONETOUCH VERIO REFLECT) w/Device KIT [id number]  Use to check blood sugar up to 3 times a day Atway, Rayann N, DO  Active Child  buPROPion  (WELLBUTRIN  XL) 300 MG 24 hr tablet [id number] No Take 1 tablet (300 mg total) by mouth in the morning and at bedtime.  Patient not taking: Reported on 08/08/2024   Tobie Gaines, DO Not Taking Active Child  Continuous Glucose Receiver (FREESTYLE LIBRE 3 READER) DEVI [id number]  Use to check your blood sugar continuously.  Patient not taking: No sig reported   Elicia Sharper, DO  Active Child  Continuous Glucose Sensor (FREESTYLE LIBRE 3 PLUS SENSOR) MISC [id number]  Change sensor every 15 days. Use to check your blood sugar continuously.  Patient not taking: Reported on 07/08/2024   Zheng, Michael, DO  Active Child  cyclobenzaprine  (FLEXERIL ) 10 MG tablet [id number] Yes Take 10 mg by mouth daily. [provider]  Active Child  diclofenac  Sodium (VOLTAREN ) 1 % GEL [id number] Yes Apply 4 g topically 4 (four) times daily. Heddy Barren, DO  Active Child  DULoxetine  (CYMBALTA ) 60 MG capsule [id number] Yes Take 1 capsule (60 mg total) by mouth daily.  Patient taking differently: Take 60 mg by  mouth 2 (two) times daily.   Tobie Gaines, DO  Active Child  empagliflozin  (JARDIANCE ) 10 MG TABS tablet [id number] Yes Take 1 tablet (10 mg total) by mouth daily. Tobie Gaines, DO  Active Child  Patient not taking:  Discontinued 08/08/24 1613 (Completed Course)   fluticasone -salmeterol (ADVAIR) 250-50 MCG/ACT AEPB [id number] Yes INHALE 1 PUFF BY MOUTH INTO LUNGS TWICE DAILY IN THE MORNING AND AT BEDTIME Kara Dorn NOVAK, MD  Active Child  glucose blood (ONETOUCH ULTRA) test strip [id number]  TEST AS DIRECTED UPTO 3 TIMES DAILY Tobie Gaines, DO  Active Child  HYDROcodone -acetaminophen  (NORCO) 10-325 MG tablet [id number] Yes Take 1 tablet by mouth 4 (four) times daily as needed for moderate pain (pain score 4-6). [provider]  Active Child  insulin  lispro (HUMALOG  KWIKPEN) 100 UNIT/ML KwikPen [id number] Yes ADMINISTER 5 UNITS UNDER THE SKIN THREE TIMES DAILY WITH MEALS  Patient taking differently: Inject 15 Units into the skin 3 (three) times daily before meals.   Tobie Gaines, DO  Active Child  Insulin  Pen Needle (PEN NEEDLES) 32G X 4 MM MISC [id number]  Use new pen needle for each insulin  injection up to 4 times a day. Zheng, Michael, DO  Active Child  Patient not taking:  Discontinued 08/08/24 1617 (Completed Course)   Lancet Device MISC [id number]  1 each by Does not apply route in the morning, at noon, and at bedtime. May substitute to any manufacturer covered by patient's insurance. Lorren Greig PARAS, NP  Active Child  lidocaine  (LIDODERM ) 5 % [id number] No Place 1 patch onto the skin daily. Remove & Discard patch within 12 hours or as directed by MD  Patient not taking: Reported on 08/08/2024   Heddy Barren, DO Not Taking Active Child  linaclotide  (LINZESS ) 290 MCG CAPS capsule [id number] Yes Take 290 mcg by mouth daily as needed (Constipation). [provider]  Active Self, Child  losartan -hydrochlorothiazide (HYZAAR) 100-25 MG tablet [id number] Yes Take 1 tablet by mouth daily.  Tobie Gaines, DO  Active Child  meloxicam  (MOBIC ) 15 MG tablet [id number] Yes Take 1 tablet (15 mg total) by mouth daily as needed for pain (and inflammation). Gawne, Meghan M, PA-C  Active Child  metFORMIN  (GLUCOPHAGE -XR) 500 MG 24 hr tablet [id number] Yes Take 2 tablets (1,000 mg total) by mouth 2 (two) times daily with a meal. Tobie Gaines, DO  Active Child  metoCLOPramide  (REGLAN ) 10 MG tablet [id number] Yes Take 1 tablet (10 mg total) by mouth every 8 (eight) hours as needed for nausea. Tobie Gaines, DO  Active Child  mineral oil-hydrophilic petrolatum  (AQUAPHOR) ointment [id number] Yes Apply topically as needed for dry skin. Azadegan, Maryam, MD  Active Child           Med Note CHRISTIE ALYSON Schaumann Aug 08, 2024  4:18 PM) Ran out    Discontinued 08/08/24 1618 (Patient Preference) omeprazole  (PRILOSEC) 40 MG capsule [id number] Yes TAKE 1 CAPSULE(40 MG) BY MOUTH Twice DAILY BEFORE BREAKFAST  Patient taking differently: Take 40 mg by mouth daily as needed (Heartburn).   Tobie Gaines, DO  Active Child  ondansetron  (ZOFRAN -ODT) 4 MG disintegrating tablet [id number] No Take 1 tablet (4 mg total) by mouth every 8 (eight) hours as needed for nausea or vomiting.  Patient not taking: Reported on 08/08/2024   Ted Sayres M, PA-C Not Taking Active Child  oxyCODONE  (ROXICODONE ) 5 MG immediate release tablet [id number] No Take 1 tablet (5 mg total) by mouth 2 (two) times daily as needed for severe pain (pain score 7-10) or breakthrough pain. in the ankle after surgery for a fracture that is not resolved by first taking your normal daily dose of Hydrocodone  for chronic pain  Patient not taking: Reported on 08/08/2024   Ted Sayres HERO, PA-C Not Taking Active Child  Patient not taking:  Discontinued 08/08/24 1612 (Completed Course)   polyethylene glycol powder (GLYCOLAX /MIRALAX ) 17 GM/SCOOP powder [id number] No Take 17 g by mouth 2 (two) times daily. Until daily soft stools OTC  Patient not taking: Reported on  08/08/2024   Vicky Charleston, PA-C Not Taking Active Child  Semaglutide , 2 MG/DOSE, (OZEMPIC , 2 MG/DOSE,) 8 MG/3ML SOPN [id number] Yes Inject 2 mg of amoxicillin into the skin once a week. [provider]  Active Child  traZODone  (DESYREL ) 50 MG tablet [id number] Yes Take 1 tablet (50 mg total) by mouth at bedtime as needed for sleep.  Patient taking differently: Take 150 mg by mouth at bedtime.   Tobie Gaines, DO  Active Child  TRESIBA  FLEXTOUCH 100 UNIT/ML FlexTouch Pen [id number] Yes ADMINISTER 15 UNITS UNDER THE SKIN TWICE DAILY  Patient taking differently: Inject 100 Units into the skin at bedtime.   Tobie Gaines, DO  Active Child  Assessment/Plan:   Diabetes: - Currently uncontrolled with most recent A1C of 8.6% above goal <7% and worsened from 8.1% in May 2025 in the setting of discontinuing Mounjaro  due to GI AE. Patient reports she has not restarted Ozempic  yet, but she can do this today. Patient is not currently monitoring BG, advised her that I would send instructions via MyChart for her daughter to reference to set up her CGM.  - Last UACR Sept 2025 - 23 mg/g - Reviewed long term cardiovascular and renal outcomes of uncontrolled blood sugar - Reviewed goal A1c, goal fasting, and goal 2 hour post prandial glucose - Reviewed hypoglycemia management plan and the rule of 15 - Recommend to continue Tresiba  at current dose (written as 15 units BID, but patient reports she is taking 20 units daily - will clarify when I am able to speak with her daughter)  - Recommend to continue Humalog  5 units TID with meals - Recommend to START Ozempic  0.25 mg x 4 weeks then increase to 0.5 mg weekly as previously instructed - Recommend to continue metformin  XR 1000 mg BID. Patient is at cusp of needing dose reduction to TDD of 1000 mg/d with eGFR ~45 mL/min.  - Recommend to continue Jardiance  10 mg daily. Recommend increasing to 25 mg daily once glycemic control improves.  -  Recommend to check glucose continuously using CGM - will send instructions via MyChart today - Next A1C due Dec 2025  - Noted that patient is overdue for eye exam. She requested new referral to ophthalmology. Collaborated with provider to place today.     Hypertension: - Currently controlled with clinic BP consistently below goal less than 130/80. - Recommend to continue losartan -hydrochlorothiazide 100-25 mg daily     Hyperlipidemia/ASCVD Risk Reduction: - Currently uncontrolled with most recent LDL-C of 111 mg/dL above goal < 70 mg/dL. Patient is prescribed moderate intensity statin. Appears she restarted after period of nonadherence in May. Should consider escalating to high intensity statin at follow-up, given presence of multiple cardiac risk factors.  - Recommend to continue atorvastatin  10 mg daily   Written patient instructions provided. Patient verbalized understanding of treatment plan.   Follow Up Plan:  Pharmacist telephone 07/29/24 PCP clinic visit 09/20/24  Lorain Baseman, PharmD [facility] [phone]

## 2024-08-13 ENCOUNTER — Other Ambulatory Visit: Payer: Self-pay | Admitting: Orthopedic Surgery

## 2024-08-13 DIAGNOSIS — S82842R Displaced bimalleolar fracture of left lower leg, subsequent encounter for open fracture type IIIA, IIIB, or IIIC with malunion: Secondary | ICD-10-CM | POA: Diagnosis not present

## 2024-08-13 LAB — GLUCOSE, CAPILLARY: Glucose-Capillary: 146 mg/dL — ABNORMAL HIGH (ref 70–99)

## 2024-08-13 MED ORDER — OXYCODONE-ACETAMINOPHEN 5-325 MG PO TABS: 1.0000 | ORAL_TABLET | ORAL | 0 refills | Status: AC | PRN

## 2024-08-13 MED ORDER — DOXYCYCLINE HYCLATE 100 MG PO TABS: 100.0000 mg | ORAL_TABLET | Freq: Two times a day (BID) | ORAL | 0 refills | Status: AC

## 2024-08-13 NOTE — Discharge Summary (Signed)
 Physician Discharge Summary  Patient ID: KAAVYA PUSKARICH MRN: [id number] DOB/AGE: [birth year] 68 y.o.  Admit date: 08/09/2024 Discharge date: 08/13/2024  Admission Diagnoses:  Principal Problem:   Ankle wound, left, initial encounter Active Problems:   Hardware complicating wound infection   Infected hardware in left leg, initial encounter   Discharge Diagnoses:  Same  Past Medical History:  Diagnosis Date   Anemia    Anxiety    Arthritis    Asthma    Bacterial vaginosis 05/16/2022   Bunion    Callus    Candida vaginitis 09/16/2022   Cholelithiasis without obstruction 04/11/2022   Chronic pain    Cocaine abuse (HCC)    COPD (chronic obstructive pulmonary disease) (HCC)    Corns and callosities    Degenerative joint disease    Depression    Diabetes mellitus    Endometrial polyp    ETOH abuse    Gall stones    Gallbladder calculus with acute cholecystitis and no obstruction 04/11/2022   GERD (gastroesophageal reflux disease)    Headache    history of Migraines   Hepatitis C    Hep C   History of adenomatous polyp of colon 04/11/2022   History of endometrial ablation 02/06/2013   Hyperlipidemia    Hypertension    Insomnia    Spondylolisthesis of lumbar region    Substance abuse (HCC)    alcoholism   Tuberculosis 1985   Wears dentures    Wears glasses     Surgeries: Procedure(s): DEBRIDEMENT LEFT ANKLE AND REMOVAL OF HARDWARE on 08/09/2024   Consultants:   Discharged Condition: Improved  Hospital Course: LAYONNA DOBIE is an 68 y.o. female who was admitted 08/09/2024 with a chief complaint of No chief complaint on file. , and found to have a diagnosis of Ankle wound, left, initial encounter.  They were brought to the operating room on 08/09/2024 and underwent the above named procedures.    They were given perioperative antibiotics:  Anti-infectives (From admission, onward)    Start     Dose/Rate Route Frequency Ordered Stop   08/11/24 1800   cephALEXin  (KEFLEX ) capsule 500 mg        500 mg Oral Every 8 hours 08/11/24 1622 08/18/24 2159   08/10/24 0600  ceFAZolin  (ANCEF ) IVPB 2g/100 mL premix        2 g 200 mL/hr over 30 Minutes Intravenous On call to O.R. 08/09/24 1204 08/09/24 1340   08/09/24 1800  ceFAZolin  (ANCEF ) IVPB 2g/100 mL premix        2 g 200 mL/hr over 30 Minutes Intravenous Every 6 hours 08/09/24 1618 08/10/24 0922   08/09/24 1359  vancomycin  (VANCOCIN ) powder  Status:  Discontinued          As needed 08/09/24 1400 08/09/24 1422   08/09/24 1100  ceFAZolin  (ANCEF ) IVPB 2g/100 mL premix  Status:  Discontinued        2 g 200 mL/hr over 30 Minutes Intravenous  Once 08/09/24 1058 08/09/24 1205     .  They were given compression stockings, early ambulation, and chemoprophylaxis for DVT prophylaxis.  They benefited maximally from their hospital stay and there were no complications.    Recent vital signs:  Vitals:   08/12/24 2155 08/13/24 0559  BP: 126/82 134/83  Pulse: 87 91  Resp: 18   Temp: 98 F (36.7 C) 98.3 F (36.8 C)  SpO2: 100% 94%    Recent laboratory studies:  Results for orders placed or performed during  the hospital encounter of 08/09/24  Comprehensive metabolic panel per protocol   Collection Time: 08/09/24 10:42 AM  Result Value Ref Range   Sodium 139 135 - 145 mmol/L   Potassium 4.3 3.5 - 5.1 mmol/L   Chloride 108 98 - 111 mmol/L   CO2 19 (L) 22 - 32 mmol/L   Glucose, Bld 102 (H) 70 - 99 mg/dL   BUN 6 (L) 8 - 23 mg/dL   Creatinine, Ser 9.10 0.44 - 1.00 mg/dL   Calcium  8.2 (L) 8.9 - 10.3 mg/dL   Total Protein 6.4 (L) 6.5 - 8.1 g/dL   Albumin 3.4 (L) 3.5 - 5.0 g/dL   AST 20 15 - 41 U/L   ALT 11 0 - 44 U/L   Alkaline Phosphatase 60 38 - 126 U/L   Total Bilirubin 0.5 0.0 - 1.2 mg/dL   GFR, Estimated >39 >39 mL/min   Anion gap 12 5 - 15  CBC per protocol   Collection Time: 08/09/24 10:42 AM  Result Value Ref Range   WBC 5.4 4.0 - 10.5 K/uL   RBC 4.19 3.87 - 5.11 MIL/uL    Hemoglobin 10.4 (L) 12.0 - 15.0 g/dL   HCT 67.1 (L) 63.9 - 53.9 %   MCV 78.3 (L) 80.0 - 100.0 fL   MCH 24.8 (L) 26.0 - 34.0 pg   MCHC 31.7 30.0 - 36.0 g/dL   RDW 83.0 (H) 88.4 - 84.4 %   Platelets 192 150 - 400 K/uL   nRBC 0.0 0.0 - 0.2 %  Glucose, capillary   Collection Time: 08/09/24 10:43 AM  Result Value Ref Range   Glucose-Capillary 117 (H) 70 - 99 mg/dL  Glucose, capillary   Collection Time: 08/09/24 12:50 PM  Result Value Ref Range   Glucose-Capillary 84 70 - 99 mg/dL  Aerobic/Anaerobic Culture w Gram Stain (surgical/deep wound)   Collection Time: 08/09/24  1:48 PM   Specimen: Soft Tissue, Other  Result Value Ref Range   Specimen Description TISSUE LEFT ANKLE    Special Requests NONE    Gram Stain      NO WBC SEEN NO ORGANISMS SEEN RED BLOOD CELLS PRESENT Performed at [facility], [address] [place], [place] [address]    Culture      RARE STAPHYLOCOCCUS LUGDUNENSIS NO ANAEROBES ISOLATED; CULTURE IN PROGRESS FOR 5 DAYS    Report Status PENDING    Organism ID, Bacteria STAPHYLOCOCCUS LUGDUNENSIS       Susceptibility   Staphylococcus lugdunensis - MIC*    CIPROFLOXACIN  <=0.5 SENSITIVE Sensitive     ERYTHROMYCIN >=8 RESISTANT Resistant     GENTAMICIN <=0.5 SENSITIVE Sensitive     OXACILLIN 2 SENSITIVE Sensitive     TETRACYCLINE <=1 SENSITIVE Sensitive     VANCOMYCIN  <=0.5 SENSITIVE Sensitive     TRIMETH/SULFA <=10 SENSITIVE Sensitive     CLINDAMYCIN  RESISTANT Resistant     RIFAMPIN <=0.5 SENSITIVE Sensitive     Inducible Clindamycin  POSITIVE Resistant     * RARE STAPHYLOCOCCUS LUGDUNENSIS  Glucose, capillary   Collection Time: 08/09/24  2:37 PM  Result Value Ref Range   Glucose-Capillary 99 70 - 99 mg/dL  Glucose, capillary   Collection Time: 08/09/24  5:06 PM  Result Value Ref Range   Glucose-Capillary 144 (H) 70 - 99 mg/dL   Comment 1 Notify RN   Glucose, capillary   Collection Time: 08/09/24  9:10 PM  Result Value Ref Range    Glucose-Capillary 142 (H) 70 - 99 mg/dL  Glucose, capillary   Collection Time: 08/10/24  7:49 AM  Result Value Ref Range   Glucose-Capillary 142 (H) 70 - 99 mg/dL   Comment 1 Notify RN   Glucose, capillary   Collection Time: 08/10/24 11:58 AM  Result Value Ref Range   Glucose-Capillary 69 (L) 70 - 99 mg/dL  Glucose, capillary   Collection Time: 08/10/24 12:18 PM  Result Value Ref Range   Glucose-Capillary 90 70 - 99 mg/dL   Comment 1 Notify RN   Glucose, capillary   Collection Time: 08/10/24  5:01 PM  Result Value Ref Range   Glucose-Capillary 83 70 - 99 mg/dL   Comment 1 Notify RN   Glucose, capillary   Collection Time: 08/10/24  9:36 PM  Result Value Ref Range   Glucose-Capillary 102 (H) 70 - 99 mg/dL  Glucose, capillary   Collection Time: 08/11/24  7:41 AM  Result Value Ref Range   Glucose-Capillary 103 (H) 70 - 99 mg/dL  Glucose, capillary   Collection Time: 08/11/24 11:55 AM  Result Value Ref Range   Glucose-Capillary 127 (H) 70 - 99 mg/dL  Glucose, capillary   Collection Time: 08/11/24  4:55 PM  Result Value Ref Range   Glucose-Capillary 106 (H) 70 - 99 mg/dL  Glucose, capillary   Collection Time: 08/11/24  9:26 PM  Result Value Ref Range   Glucose-Capillary 75 70 - 99 mg/dL  Glucose, capillary   Collection Time: 08/12/24  7:45 AM  Result Value Ref Range   Glucose-Capillary 136 (H) 70 - 99 mg/dL  Glucose, capillary   Collection Time: 08/12/24 11:49 AM  Result Value Ref Range   Glucose-Capillary 181 (H) 70 - 99 mg/dL  Glucose, capillary   Collection Time: 08/12/24  6:16 PM  Result Value Ref Range   Glucose-Capillary 178 (H) 70 - 99 mg/dL  Glucose, capillary   Collection Time: 08/12/24  9:54 PM  Result Value Ref Range   Glucose-Capillary 74 70 - 99 mg/dL   *Note: Due to a large number of results and/or encounters for the requested time period, some results have not been displayed. A complete set of results can be found in Results Review.    Discharge  Medications:   Allergies as of 08/13/2024   No Known Allergies      Medication List     STOP taking these medications    HYDROcodone -acetaminophen  10-325 MG tablet Commonly known as: NORCO   oxyCODONE  5 MG immediate release tablet Commonly known as: Roxicodone        TAKE these medications    Accu-Chek FastClix Lancets Misc Use as directed to check blood sugars 2 times per day dx: e11.65   Accu-Chek Softclix Lancets lancets Use as instructed to check blood sugars twice daily E11.69   albuterol  108 (90 Base) MCG/ACT inhaler Commonly known as: VENTOLIN  HFA Inhale 1-2 puffs into the lungs every 6 (six) hours as needed for wheezing or shortness of breath.   alendronate  10 MG tablet Commonly known as: FOSAMAX  Take 1 tablet (10 mg total) by mouth daily before breakfast. Take with a full glass of water on an empty stomach.   aspirin  EC 81 MG tablet Take 1 tablet (81 mg total) by mouth 2 (two) times daily. To prevent blood clots for 30 days after surgery.   atorvastatin  10 MG tablet Commonly known as: LIPITOR Take 1 tablet (10 mg total) by mouth daily.   buPROPion  300 MG 24 hr tablet Commonly known as: WELLBUTRIN  XL Take 1 tablet (300 mg total)  by mouth in the morning and at bedtime.   cyclobenzaprine  10 MG tablet Commonly known as: FLEXERIL  Take 10 mg by mouth daily.   diclofenac  Sodium 1 % Gel Commonly known as: Voltaren  Apply 4 g topically 4 (four) times daily.   DULoxetine  60 MG capsule Commonly known as: Cymbalta  Take 1 capsule (60 mg total) by mouth daily. What changed: when to take this   empagliflozin  10 MG Tabs tablet Commonly known as: Jardiance  Take 1 tablet (10 mg total) by mouth daily.   fluticasone -salmeterol 250-50 MCG/ACT Aepb Commonly known as: ADVAIR INHALE 1 PUFF BY MOUTH INTO LUNGS TWICE DAILY IN THE MORNING AND AT BEDTIME   FreeStyle Libre 3 Plus Sensor Misc Change sensor every 15 days. Use to check your blood sugar continuously.    FreeStyle Libre 3 Reader [place] Use to check your blood sugar continuously.   HumaLOG  KwikPen 100 UNIT/ML KwikPen Generic drug: insulin  lispro ADMINISTER 5 UNITS UNDER THE SKIN THREE TIMES DAILY WITH MEALS What changed: See the new instructions.   Lancet Device Misc 1 each by Does not apply route in the morning, at noon, and at bedtime. May substitute to any manufacturer covered by patient's insurance.   lidocaine  5 % Commonly known as: Lidoderm  Place 1 patch onto the skin daily. Remove & Discard patch within 12 hours or as directed by MD   linaclotide  290 MCG Caps capsule Commonly known as: LINZESS  Take 290 mcg by mouth daily as needed (Constipation).   losartan -hydrochlorothiazide 100-25 MG tablet Commonly known as: Hyzaar Take 1 tablet by mouth daily.   meloxicam  15 MG tablet Commonly known as: MOBIC  Take 1 tablet (15 mg total) by mouth daily as needed for pain (and inflammation).   metFORMIN  500 MG 24 hr tablet Commonly known as: GLUCOPHAGE -XR Take 2 tablets (1,000 mg total) by mouth 2 (two) times daily with a meal.   metoCLOPramide  10 MG tablet Commonly known as: REGLAN  Take 1 tablet (10 mg total) by mouth every 8 (eight) hours as needed for nausea.   mineral oil-hydrophilic petrolatum  ointment Apply topically as needed for dry skin.   omeprazole  40 MG capsule Commonly known as: PRILOSEC TAKE 1 CAPSULE(40 MG) BY MOUTH Twice DAILY BEFORE BREAKFAST What changed:  how much to take how to take this when to take this reasons to take this additional instructions   ondansetron  4 MG disintegrating tablet Commonly known as: ZOFRAN -ODT Take 1 tablet (4 mg total) by mouth every 8 (eight) hours as needed for nausea or vomiting.   OneTouch Ultra test strip Generic drug: glucose blood TEST AS DIRECTED UPTO 3 TIMES DAILY   OneTouch Verio Reflect w/Device Kit Use to check blood sugar up to 3 times a day   oxyCODONE -acetaminophen  5-325 MG tablet Commonly known as:  PERCOCET/ROXICET Take 1 tablet by mouth every 4 (four) hours as needed.   Ozempic  (2 MG/DOSE) 8 MG/3ML Sopn Generic drug: Semaglutide  (2 MG/DOSE) Inject 2 mg of amoxicillin into the skin once a week.   Pen Needles 32G X 4 MM Misc Use new pen needle for each insulin  injection up to 4 times a day.   polyethylene glycol powder 17 GM/SCOOP powder Commonly known as: GLYCOLAX /MIRALAX  Take 17 g by mouth 2 (two) times daily. Until daily soft stools OTC   traZODone  50 MG tablet Commonly known as: DESYREL  Take 1 tablet (50 mg total) by mouth at bedtime as needed for sleep. What changed:  how much to take when to take this   Tresiba  FlexTouch 100  UNIT/ML FlexTouch Pen Generic drug: insulin  degludec ADMINISTER 15 UNITS UNDER THE SKIN TWICE DAILY What changed: See the new instructions.   Tylenol  8 Hour 650 MG CR tablet Generic drug: acetaminophen  Take 1,300 mg by mouth every 8 (eight) hours as needed for pain.        Diagnostic Studies: XR Ankle Complete Left Result Date: 08/09/2024 Three-view radiographs of the left foot shows retained hardware status post plate and screw internal fixation for the  fibular fracture and internal fixation for the medial malleolar fracture and a tight rope for the syndesmosis injury.   Disposition: Discharge disposition: 01-Home or Self Care       Discharge Instructions     Call MD / Call 911   Complete by: As directed    If you experience chest pain or shortness of breath, CALL 911 and be transported to the hospital emergency room.  If you develope a fever above 101 F, pus (white drainage) or increased drainage or redness at the wound, or calf pain, call your surgeon's office.   Constipation Prevention   Complete by: As directed    Drink plenty of fluids.  Prune juice may be helpful.  You may use a stool softener, such as Colace (over the counter) 100 mg twice a day.  Use MiraLax  (over the counter) for constipation as needed.   Diet - low  sodium heart healthy   Complete by: As directed    Increase activity slowly as tolerated   Complete by: As directed    Negative Pressure Wound Therapy - Incisional   Complete by: As directed    Continue the Prevena pump.  Give patient a new canister for discharge.   Post-operative opioid taper instructions:   Complete by: As directed    POST-OPERATIVE OPIOID TAPER INSTRUCTIONS: It is important to wean off of your opioid medication as soon as possible. If you do not need pain medication after your surgery it is ok to stop day one. Opioids include: Codeine, Hydrocodone (Norco, Vicodin), Oxycodone (Percocet, oxycontin ) and hydromorphone  amongst others.  Long term and even short term use of opiods can cause: Increased pain response Dependence Constipation Depression Respiratory depression And more.  Withdrawal symptoms can include Flu like symptoms Nausea, vomiting And more Techniques to manage these symptoms Hydrate well Eat regular healthy meals Stay active Use relaxation techniques(deep breathing, meditating, yoga) Do Not substitute Alcohol to help with tapering If you have been on opioids for less than two weeks and do not have pain than it is ok to stop all together.  Plan to wean off of opioids This plan should start within one week post op of your joint replacement. Maintain the same interval or time between taking each dose and first decrease the dose.  Cut the total daily intake of opioids by one tablet each day Next start to increase the time between doses. The last dose that should be eliminated is the evening dose.           Contact information for follow-up providers     Care, [facility] Follow up.   Contact information: [address] [place] [address] [phone]         Harden Jerona GAILS, MD Follow up in 1 week(s).   Specialty: Orthopedic Surgery Why: Follow-up on Friday. Contact information: [address] [place]  [address] [phone]              Contact information for after-discharge care     Home Medical  Care     [facility]) .   Service: Home Health Services                      Signed: Jerona LULLA Sage 08/13/2024, 7:36 AM

## 2024-08-13 NOTE — Telephone Encounter (Signed)
 Message forwarded to [facility] to f/u with the follow up with the  request from PT/OT. Please see the previous f/u message before contacting [place] from [facility].   Type Date User Summary Attachment  General 07/11/2024  9:54 AM Shirlean Fujisawa F Auto: Referral message -  Note: ----- Message ----- From: Shirlean Fujisawa FALCON Sent: 07/11/2024   9:54 AM EDT To: Dannie JAYSON Footman, NT     Type Date User Summary Attachment  General 07/24/2024  3:28 PM Footman Dannie C, NT Patient just had surgery done on 07-09-2024,patient has gone on one visit to surgeon and has another  visit coming in on 08-08-2024.The surgeon has not told the patient to do any PT or OT as of yet Dannie JAYSON Footman, NT10/15/253:28 PM -   ----- Message ----- From: Footman Dannie JAYSON, NT Sent: 07/24/2024   3:34 PM EDT To: Fujisawa FALCON Shirlean   I spoke to the patient today, she has been back to the surgeon and will go back on the 08-08-2024 he has not said for her to have any PT or thearpy  as of yet Dannie JAYSON Footman, NT10/15/253:34 PM  Copied from CRM [id number]. Topic: Referral - Question >> Aug 08, 2024  2:37 PM Mercer PEDLAR wrote: Reason for CRM: Selinda from [facility] regarding occupational therapy and physical therapy referral which was placed on 07/10/24. He stated that patient has not heard back to get those scheduled.  Selinda would like a callback.  Callback: [phone]

## 2024-08-13 NOTE — Progress Notes (Signed)
 Discharge Nurse Summary: DC order noted per MD. DC RN at bedside with patient. Patient agreeable with discharge plan, states family will arrive soon for pickup. AVS printed/reviewed. PIV removed, skin intact. No DME needs. No home/TOC meds. CP/Edu resolved. Telemonitor not present on assessment. All belongings accounted for. Dressing to wound CDI. Wound vac in proper function, wound vac container exchanged prior to dc. See LDAs. Patient wheeled downstairs for discharge by private auto.   Rosario EMERSON Lund, RN

## 2024-08-13 NOTE — TOC Progression Note (Signed)
 Transition of Care [facility]) - Progression Note   Discharge today , updated Cheryl with [facility] Patient Details  Name: Kara Kelley MRN: [id number] Date of Birth: 07-08-1956  Transition of [facility]) CM/SW Contact  Jibri Schriefer, Powell Jansky, RN Phone Number: 08/13/2024, 9:30 AM  Clinical Narrative:       Expected Discharge Plan: Home w Home Health Services Barriers to Discharge: Continued Medical Work up               Expected Discharge Plan and Services   Discharge Planning Services: CM Consult Post Acute Care Choice: Home Health Living arrangements for the past 2 months: Single Family Home Expected Discharge Date: 08/13/24               DME Arranged:  (see note)         HH Arranged: PT HH Agency: [facility] Date HH Agency Contacted: 08/12/24 Time HH Agency Contacted: 1244 Representative spoke with at [facility] Agency: [place] left message   Social Drivers of Health (SDOH) Interventions SDOH Screenings   Food Insecurity: No Food Insecurity (08/10/2024)  Housing: Low Risk  (08/09/2024)  Transportation Needs: No Transportation Needs (08/09/2024)  Utilities: Not At Risk (08/09/2024)  Alcohol Screen: Low Risk  (04/20/2023)  Depression (PHQ2-9): Low Risk  (06/19/2024)  Financial Resource Strain: Low Risk  (04/20/2023)  Physical Activity: Sufficiently Active (04/20/2023)  Social Connections: Moderately Integrated (08/09/2024)  Stress: No Stress Concern Present (04/20/2023)  Tobacco Use: Medium Risk (08/09/2024)  Health Literacy: Adequate Health Literacy (04/20/2023)    Readmission Risk Interventions     No data to display

## 2024-08-13 NOTE — Progress Notes (Signed)
 Patient ID: Kara Kelley, female   DOB: 12-Dec-1955, 68 y.o.   MRN: [id number] Wound VAC was alarming this morning from low battery.  The wound VAC was plugged in and functioning well.  Plan for discharge to home today.  Show patient how to plug in the wound VAC to keep it charged.  Plan to follow-up on Friday.

## 2024-08-13 NOTE — Plan of Care (Signed)
  Problem: Pain Managment: Goal: General experience of comfort will improve and/or be controlled Outcome: Progressing   Problem: Safety: Goal: Ability to remain free from injury will improve Outcome: Progressing   Problem: Skin Integrity: Goal: Risk for impaired skin integrity will decrease Outcome: Progressing

## 2024-08-14 ENCOUNTER — Other Ambulatory Visit: Payer: Self-pay | Admitting: Student

## 2024-08-14 DIAGNOSIS — G4700 Insomnia, unspecified: Secondary | ICD-10-CM

## 2024-08-14 LAB — AEROBIC/ANAEROBIC CULTURE W GRAM STAIN (SURGICAL/DEEP WOUND): Gram Stain: NONE SEEN

## 2024-08-14 MED ORDER — TRAZODONE HCL 50 MG PO TABS: 50.0000 mg | ORAL_TABLET | Freq: Every evening | ORAL | 0 refills | Status: AC | PRN

## 2024-08-14 NOTE — Telephone Encounter (Signed)
 Copied from CRM [id number]. Topic: Clinical - Medication Refill >> Aug 14, 2024  9:29 AM Cherylann RAMAN wrote: Medication: traZODone  (DESYREL ) 50 MG tablet  Has the patient contacted their pharmacy? No (Agent: If no, request that the patient contact the pharmacy for the refill. If patient does not wish to contact the pharmacy document the reason why and proceed with request.) (Agent: If yes, when and what did the pharmacy advise?)  This is the patient's preferred pharmacy:  [facility] [phone] - [place], [place] - [address] AT [facility] [address] [place] [address] Phone: [phone] Fax: [phone]  Is this the correct pharmacy for this prescription? Yes If no, delete pharmacy and type the correct one.   Has the prescription been filled recently? No  Is the patient out of the medication? Yes  Has the patient been seen for an appointment in the last year OR does the patient have an upcoming appointment? Yes  Can we respond through MyChart? Yes  Agent: Please be advised that Rx refills may take up to 3 business days. We ask that you follow-up with your pharmacy.

## 2024-08-15 ENCOUNTER — Other Ambulatory Visit: Payer: Self-pay | Admitting: Student

## 2024-08-15 NOTE — Telephone Encounter (Signed)
 Copied from CRM [id number]. Topic: Clinical - Medication Refill >> Aug 15, 2024  9:39 AM Graeme ORN wrote: Medication: Neither - listed as current but patient states still takes both cyclobenzaprine  (FLEXERIL ) 10 MG tablet hydrOXYzine  (ATARAX ) 25 MG tablet   Has the patient contacted their pharmacy? Yes (Agent: If no, request that the patient contact the pharmacy for the refill. If patient does not wish to contact the pharmacy document the reason why and proceed with request.) (Agent: If yes, when and what did the pharmacy advise?) Contact provider  This is the patient's preferred pharmacy:  [facility] [phone] - [place], [place] - [address] AT [facility] [address] [place] [address] Phone: [phone] Fax: [phone]  Is this the correct pharmacy for this prescription? Yes If no, delete pharmacy and type the correct one.   Has the prescription been filled recently? No  Is the patient out of the medication? Yes  Has the patient been seen for an appointment in the last year OR does the patient have an upcoming appointment? Yes  Can we respond through MyChart? No  Agent: Please be advised that Rx refills may take up to 3 business days. We ask that you follow-up with your pharmacy.

## 2024-08-16 ENCOUNTER — Telehealth: Payer: Self-pay | Admitting: Orthopedic Surgery

## 2024-08-16 NOTE — Telephone Encounter (Signed)
 Pt has an appt on Monday. Will hold and update after in office evaluation.

## 2024-08-16 NOTE — Telephone Encounter (Signed)
 Leita called. She will be working with patient in home. She would like to know the WB status for patient. Also would like orders for PT 2wk 2, 1wk 6 and an order for a nurse Eval. Her [phone]

## 2024-08-19 ENCOUNTER — Ambulatory Visit: Admitting: Physician Assistant

## 2024-08-19 ENCOUNTER — Ambulatory Visit: Payer: Self-pay

## 2024-08-19 ENCOUNTER — Other Ambulatory Visit

## 2024-08-19 ENCOUNTER — Encounter: Payer: Self-pay | Admitting: Physician Assistant

## 2024-08-19 DIAGNOSIS — T847XXA Infection and inflammatory reaction due to other internal orthopedic prosthetic devices, implants and grafts, initial encounter: Secondary | ICD-10-CM

## 2024-08-19 DIAGNOSIS — E1122 Type 2 diabetes mellitus with diabetic chronic kidney disease: Secondary | ICD-10-CM

## 2024-08-19 DIAGNOSIS — N1832 Chronic kidney disease, stage 3b: Secondary | ICD-10-CM

## 2024-08-19 DIAGNOSIS — E1165 Type 2 diabetes mellitus with hyperglycemia: Secondary | ICD-10-CM

## 2024-08-19 MED ORDER — INSULIN LISPRO (1 UNIT DIAL) 100 UNIT/ML (KWIKPEN): 5.0000 [IU] | PEN_INJECTOR | Freq: Three times a day (TID) | SUBCUTANEOUS | 3 refills | Status: AC

## 2024-08-19 MED ORDER — TRESIBA FLEXTOUCH 100 UNIT/ML ~~LOC~~ SOPN: 20.0000 [IU] | PEN_INJECTOR | Freq: Every day | SUBCUTANEOUS | 3 refills | Status: AC

## 2024-08-19 NOTE — Progress Notes (Addendum)
 Office Visit Note   Patient: Kara Kelley           Date of Birth: [birth year]           MRN: [id number] Visit Date: 08/19/2024              Requested by: Tobie Gaines, DO [address] [place],  [place] [address] PCP: Tobie Gaines, DO  Chief Complaint  Patient presents with   Left Ankle - Routine Post Op    08/09/2024 debridement of ankle and removal of hardware       HPI:  Kara Kelley is a  68 y.o. female that sustained a bimalleolar fracture that was repaired by Dr. Velinda Chancy on 07/02/24.  She was sent to our office for consultation due to infected hardware.   The hardware was removed on 08/09/24.  She was discharged with wound vac in place.  She has been weight bearing as tolerates in a cam boot.    She is now 6 weeks post op from ORIF of the bimalleolar fracture.   Plan for removal of vac, sutures and x rays to check the ankle for healing.  She denies much pain with weight bearing in the cam boot.  Home health PT will be coming to her house.    Cultures grew STAPHYLOCOCCUS LUGDUNENSIS she was prescribed Doxycycline  oral BID 100 mg.  She denies fever and chills.    Assessment & Plan: Visit Diagnoses:  1. Hardware complicating wound infection, initial encounter     Plan: Wean out of the cam boot as tolerates into a comfortable shoe after 1 more week.  We discussed the importance of elevation to decrease swelling and promote incisional healing.  Sutures were removed today and steri strips were placed over the incision.  She may shower as needed.    Follow-Up Instructions: Return in about 2 years (around 08/19/2026).   Ortho Exam  Patient is alert, oriented, no adenopathy, well-dressed, normal affect, normal respiratory effort. Moderate edema in the lateral malleolus area.  Tenderness to palpation near the incision.  Palpable DP pulse, superficial skin separation without drainage.  Sutures intact.  Good ankle ROM Plantar/dorsiflexion inversion and eversion  intact.         Imaging: Stable syndesmosis  space acceptable between the tibia and fibula  without widening.  No signs of bony collapse.   Uniform joint spaces. The lateral and medial malleoli   Labs: Lab Results  Component Value Date   HGBA1C 8.6 (A) 06/19/2024   HGBA1C 8.1 (A) 02/27/2024   HGBA1C 7.4 (A) 11/01/2023   ESRSEDRATE 22 03/12/2020   ESRSEDRATE 71 (H) 07/18/2017   CRP 20.1 (H) 07/17/2017   REPTSTATUS 08/14/2024 FINAL 08/09/2024   GRAMSTAIN  08/09/2024    NO WBC SEEN NO ORGANISMS SEEN RED BLOOD CELLS PRESENT    CULT  08/09/2024    RARE STAPHYLOCOCCUS LUGDUNENSIS NO ANAEROBES ISOLATED Performed at [facility], [address], [place], [place] [address]    [facility] STAPHYLOCOCCUS LUGDUNENSIS 08/09/2024     Lab Results  Component Value Date   ALBUMIN 3.4 (L) 08/09/2024   ALBUMIN 4.3 06/21/2024   ALBUMIN 3.6 03/24/2024    Lab Results  Component Value Date   MG 1.6 12/21/2023   MG 1.6 (L) 08/25/2022   MG 1.7 09/24/2019   Lab Results  Component Value Date   VD25OH 53 05/15/2009    No results found for: PREALBUMIN    Latest Ref Rng &  Units 08/09/2024   10:42 AM 06/21/2024    3:11 PM 06/21/2024    2:46 PM  CBC EXTENDED  WBC 4.0 - 10.5 K/uL 5.4   8.7   RBC 3.87 - 5.11 MIL/uL 4.19   5.02   Hemoglobin 12.0 - 15.0 g/dL 89.5  86.0  87.4   HCT 36.0 - 46.0 % 32.8  41.0  38.4   Platelets 150 - 400 K/uL 192   194   NEUT# 1.7 - 7.7 K/uL   6.1   Lymph# 0.7 - 4.0 K/uL   2.2      There is no height or weight on file to calculate BMI.  Orders:  Orders Placed This Encounter  Procedures   XR Ankle Complete Left   No orders of the defined types were placed in this encounter.    Procedures: No procedures performed  Clinical Data: No additional findings.  ROS:  All other systems negative, except as noted in the HPI. Review of Systems  Objective: Vital Signs: There were no vitals taken for this visit.  Specialty Comments:  No  specialty comments available.  PMFS History: Patient Active Problem List   Diagnosis Date Noted   Infected hardware in left leg, initial encounter 08/10/2024   Hardware complicating wound infection 08/09/2024   Ankle wound, left, initial encounter 08/09/2024   Itching in the vaginal area 04/18/2024   Polypharmacy 02/27/2024   Hyperlipidemia 02/27/2024   Falls, subsequent encounter 02/07/2024   Anemia 12/21/2023   Insomnia 11/22/2023   Osteoporosis 11/09/2023   Neck pain 08/23/2023   Spinal stenosis, lumbar 08/24/2022   Uncontrolled type 2 diabetes mellitus on insulin  (HCC) 04/11/2022   Morbid obesity (HCC) 04/11/2022   Polysubstance abuse (HCC) 04/11/2022   CKD stage 3b, GFR 30-44 ml/min (HCC) 04/2022   Hyperthyroidism 12/29/2021   Cervical spondylosis with myelopathy and radiculopathy 10/26/2021   Hypertensive retinopathy of both eyes 03/06/2019   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) 03/06/2019   Essential hypertension 10/05/2018   Chronic obstructive pulmonary disease (HCC) 10/05/2018   Chronic hepatitis C (HCC) 12/09/2015   Gastroesophageal reflux disease 05/22/2014   Past Medical History:  Diagnosis Date   Anemia    Anxiety    Arthritis    Asthma    Bacterial vaginosis 05/16/2022   Bunion    Callus    Candida vaginitis 09/16/2022   Cholelithiasis without obstruction 04/11/2022   Chronic pain    Cocaine abuse (HCC)    COPD (chronic obstructive pulmonary disease) (HCC)    Corns and callosities    Degenerative joint disease    Depression    Diabetes mellitus    Endometrial polyp    ETOH abuse    Gall stones    Gallbladder calculus with acute cholecystitis and no obstruction 04/11/2022   GERD (gastroesophageal reflux disease)    Headache    history of Migraines   Hepatitis C    Hep C   History of adenomatous polyp of colon 04/11/2022   History of endometrial ablation 02/06/2013   Hyperlipidemia    Hypertension    Insomnia     Spondylolisthesis of lumbar region    Substance abuse (HCC)    alcoholism   Tuberculosis 1985   Wears dentures    Wears glasses     Family History  Problem Relation Age of Onset   Heart disease Mother    Diabetes Other        mat great aunt   Cirrhosis Other  mat great aunt   Cirrhosis Other        mat great uncles x 2   Colon cancer Neg Hx    Breast cancer Neg Hx     Past Surgical History:  Procedure Laterality Date   ANTERIOR CERVICAL DECOMP/DISCECTOMY FUSION N/A 10/26/2021   Procedure: ACDF - C4-C5 - C5-C6 - C6-C7;  Surgeon: Louis Shove, MD;  Location: [facility] OR;  Service: Neurosurgery;  Laterality: N/A;   BACK SURGERY  2018   CESAREAN SECTION     x3   CHOLECYSTECTOMY N/A 08/11/2016   Procedure: LAPAROSCOPIC CHOLECYSTECTOMY WITH   INTRAOPERATIVE CHOLANGIOGRAM;  Surgeon: Jina Nephew, MD;  Location: [facility] ORS;  Service: General;  Laterality: N/A;   COLONOSCOPY     Cotton Osteotomy w/ Graft Left 06/18/2009   Excision of Benign Lesion Right 01/30/2013   Rt Plantar   FOOT SURGERY     HAMMER TOE REPAIR Right 02/12/2016   RIGHT #5   Hammertoe Repair Left 06/18/2009   Lt #5   HARDWARE REMOVAL Left 08/09/2024   Procedure: DEBRIDEMENT LEFT ANKLE AND REMOVAL OF HARDWARE;  Surgeon: Harden Jerona GAILS, MD;  Location: [facility] OR;  Service: Orthopedics;  Laterality: Left;  HARDWARE REMOVAL LEFT ANKLE   HYSTEROSCOPY N/A 06/20/2017   Procedure: DILATION AND CURETTAGE, HYSTEROSCOPY w/ Polypectomy;  Surgeon: Corene Coy, MD;  Location: [facility] ORS;  Service: Gynecology;  Laterality: N/A;   LUMBAR FUSION  2018   METATARSAL OSTEOTOMY Left 06/18/2009   #5   MULTIPLE TOOTH EXTRACTIONS     Nail Matrixectomy Left 06/18/2009   LT #1   ORIF ANKLE FRACTURE Left 07/02/2024   Procedure: OPEN REDUCTION INTERNAL FIXATION (ORIF) ANKLE FRACTURE;  Surgeon: Beverley Evalene BIRCH, MD;  Location:  SURGERY CENTER;  Service: Orthopedics;  Laterality: Left;   OSTEOTOMY Right 01/30/2013   Rt #5   Phalangectomy  Left 06/18/2009   LT #1   Phalangectomy Right 01/30/2013   Rt #1   TUBAL LIGATION     Social History   Occupational History   Occupation: disablily  Tobacco Use   Smoking status: Former    Current packs/day: 0.00    Average packs/day: 0.8 packs/day for 47.0 years (35.3 ttl pk-yrs)    Types: Cigarettes    Start date: 73    Quit date: 2020    Years since quitting: 5.8    Passive exposure: Past   Smokeless tobacco: Never  Vaping Use   Vaping status: Never Used  Substance and Sexual Activity   Alcohol use: No    Comment: no ETOH 6 yrs   Drug use: No    Comment: in rehab   Sexual activity: Not Currently    Birth control/protection: Post-menopausal

## 2024-08-19 NOTE — Progress Notes (Signed)
 08/19/2024 Name: Kara Kelley MRN: [id number] DOB: January 06, 1956  Chief Complaint  Patient presents with   Diabetes    Kara Kelley is a 68 y.o. year old female who presented for a telephone visit.   They were referred to the pharmacist by their PCP for assistance in managing diabetes and polypharmacy. PMH includes HTN, COPD, hepatitis C, GERD, T2DM with retinopathy, osteoporosis, CKD3, HLD.    Subjective: Patient was seen by PCP, Kara Blanch, DO, on 06/05/24. At last visit, BP was controlled on losartan -hydrochlorothiazide. Her last A1C was 8.1% in May, increased from 7.4%. Previously, she was transitioned from Ozempic  1 mg to Mounjaro  2.5 mg weekly, but at her most recent visit she reported not tolerating Mounjaro  and was instructed to restart Ozempic  at 0.25 mg weekly. She was instructed to continue Tresiba , Humalog , and metformin  as previously prescribed. She was outreached by pharmacy via telephone on 04/19/24, but outreach was unsuccessful. Her pharmacy appt has been rescheduled multiple times. She was seen by Dr. Harrie on 06/19/24. A1C increased to 8.6%. She was instructed to titrate Ozempic . eGFR has remained < 45 mL/min on past 2 BMPs. She had a fall on 06/21/24 with L ankle fracture, and underwent surgery on 07/02/24. She was engaged by pharmacy via telephone on 07/08/24. She was instructed to start Ozempic  at 0.25 mg x4 weeks, then increase to 0.5 mg weekly. She reported her daughter would be able to help her set up CGM monitoring. She came to [facility] to see Dr. Blanch on 07/24/24, reporting worsened abdominal pain and nausea/vomiting. Also reported 2-3 mo hx of dysphagia. Ozempic  was discontinued and omeprazole  was increased to 40 mg twice daily. She was also referred to GI for an endoscopy.  Today, patient reports doing ok. She has been staying with her sister for the past week, after she was readmitted for an infection following ankle surgery. She has not had gluocometer with her for the  past week. Reports she has been taking Tresiba  but has been out of Humalog  for a week. She did stop Ozempic  as previously instructed. Reports she has continued to have nausea. No vomiting. States she cannot tell a difference in GI AE since stopping Ozempic . Appears she did seen GI on 08/08/24, but unable to view note.    Care Team: Primary Care Provider: Doyal Miyamoto, MD on 09/20/24   Medication Access/Adherence  Current Pharmacy:  [facility] [phone] - [place], [place] - [address] AT [facility] [address] [place] [address] Phone: [phone] Fax: [phone]   Patient reports affordability concerns with their medications: No  Patient reports access/transportation concerns to their pharmacy: Yes  - Daughter usually takes her to [facility] to pick up her medications. Declines transition to mail order pharmacy at this time.  Patient reports adherence concerns with their medications:  Yes  - has been out of Humalog  for one week   Diabetes:  Current medications: Tresiba  15 units BID (states she takes 20 units daily around midday), Humalog  5 units TID with meals (was taking as prescribed prior to running out), metformin  XR 1000 mg BID, Jardiance  10 mg daily  Medications tried in the past: Mounjaro  - GI AE, Ozempic  - GI AE (worsened dysphagia, abdominal pain, n/v)  Current glucose readings: Reports that she is not currently checking. Has One Touch meter at home. Also has Freestyle Libre sensors that she has not set up yet. 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  When she was at home, stated she was checking 2-3 times per day, recalls readings of 140-150 mg/dL.   Patient denies hypoglycemic s/sx including dizziness, shakiness, sweating. Patient denies hyperglycemic symptoms including polyuria, polydipsia, polyphagia, nocturia, neuropathy, blurred vision.  Current meal  patterns: did not discuss in depth today  Current physical activity: limited by ankle fracture currently s/p surgical intervention   Current medication access support: UHC Dual Complete  Hypertension:  Current medications:  losartan -hydrochlorothiazide 100-25 mg daily  Hyperlipidemia/ASCVD Risk Reduction  Current lipid lowering medications: atorvastatin  10 mg daily  Antiplatelet regimen: aspirin  81 mg daily  ASCVD History: none known Family History: heart disease in mother Risk Factors: HTN, T2DM, LDL > 100 mg/dL, former smoker  Clinical ASCVD: No  The 10-year ASCVD risk score (Arnett DK, et al., 2019) is: 22%   Values used to calculate the score:     Age: 69 years     Clincally relevant sex: Female     Is Non-Hispanic African American: Yes     Diabetic: Yes     Tobacco smoker: No     Systolic Blood Pressure: 134 mmHg     Is BP treated: Yes     HDL Cholesterol: 58 mg/dL     Total Cholesterol: 184 mg/dL    Objective:  BP Readings from Last 3 Encounters:  08/13/24 134/83  07/24/24 (!) 142/82  07/02/24 (!) 147/72    Lab Results  Component Value Date   HGBA1C 8.6 (A) 06/19/2024   HGBA1C 8.1 (A) 02/27/2024   HGBA1C 7.4 (A) 11/01/2023       Latest Ref Rng & Units 08/09/2024   10:42 AM 06/21/2024    3:11 PM 06/21/2024    2:46 PM  BMP  Glucose 70 - 99 mg/dL 897  863  864   BUN 8 - 23 mg/dL 6  11  10    Creatinine 0.44 - 1.00 mg/dL 9.10  8.59  8.64   Sodium 135 - 145 mmol/L 139  137  134   Potassium 3.5 - 5.1 mmol/L 4.3  3.7  3.6   Chloride 98 - 111 mmol/L 108  100  98   CO2 22 - 32 mmol/L 19   22   Calcium  8.9 - 10.3 mg/dL 8.2   9.3     Lab Results  Component Value Date   CHOL 184 06/19/2024   HDL 58 06/19/2024   LDLCALC 111 (H) 06/19/2024   TRIG 84 06/19/2024   CHOLHDL 3.2 06/19/2024    Medications Reviewed Today     Reviewed by Kara Kelley, RPH (Pharmacist) on 08/19/24 at 1457  Med List Status: <None>   Medication Order Taking? Sig Documenting  Provider Last Dose Status Informant  Accu-Chek FastClix Lancets MISC [id number]  Use as directed to check blood sugars 2 times per day dx: e11.65 Tobie Gaines, DO  Active Child  Accu-Chek Softclix Lancets lancets [id number]  Use as instructed to check blood sugars twice daily E11.69 Tobie Gaines, DO  Active Child  acetaminophen  (TYLENOL  8 HOUR) 650 MG CR tablet [id number]  Take 1,300 mg by mouth every 8 (eight) hours as needed for pain. [provider]  Active Child  albuterol  (VENTOLIN  HFA) 108 (90 Base) MCG/ACT inhaler [id number]  Inhale 1-2 puffs into the lungs every 6 (six) hours as needed for wheezing or shortness of breath. Kara Dorn NOVAK, MD  Active Child  alendronate  (FOSAMAX ) 10  MG tablet [id number]  Take 1 tablet (10 mg total) by mouth daily before breakfast. Take with a full glass of water on an empty stomach. Tobie Gaines, DO  Active Child  aspirin  EC 81 MG tablet [id number]  Take 1 tablet (81 mg total) by mouth 2 (two) times daily. To prevent blood clots for 30 days after surgery.  Patient not taking: Reported on 08/08/2024   Gawne, Meghan M, PA-C  Active Child  atorvastatin  (LIPITOR) 10 MG tablet [id number]  Take 1 tablet (10 mg total) by mouth daily. Tobie Gaines, DO  Active Child  Blood Glucose Monitoring Suppl (ONETOUCH VERIO REFLECT) w/Device KIT [id number]  Use to check blood sugar up to 3 times a day Atway, Rayann N, DO  Active Child  buPROPion  (WELLBUTRIN  XL) 300 MG 24 hr tablet [id number]  Take 1 tablet (300 mg total) by mouth in the morning and at bedtime.  Patient not taking: Reported on 08/08/2024   Tobie Gaines, DO  Active Child  Continuous Glucose Receiver (FREESTYLE LIBRE 3 READER) DEVI [id number]  Use to check your blood sugar continuously.  Patient not taking: Reported on 08/19/2024   Zheng, Michael, DO  Active Child  Continuous Glucose Sensor (FREESTYLE LIBRE 3 PLUS SENSOR) MISC [id number]  Change sensor every 15 days. Use to check your blood sugar continuously.  Patient  not taking: Reported on 08/19/2024   Zheng, Michael, DO  Active Child  cyclobenzaprine  (FLEXERIL ) 10 MG tablet [id number]  Take 10 mg by mouth daily. [provider]  Active Child  diclofenac  Sodium (VOLTAREN ) 1 % GEL [id number]  Apply 4 g topically 4 (four) times daily. Heddy Barren, DO  Active Child  doxycycline  (VIBRA -TABS) 100 MG tablet [id number]  Take 1 tablet (100 mg total) by mouth 2 (two) times daily. Harden Jerona GAILS, MD  Active   DULoxetine  (CYMBALTA ) 60 MG capsule [id number]  Take 1 capsule (60 mg total) by mouth daily.  Patient taking differently: Take 60 mg by mouth 2 (two) times daily.   Tobie Gaines, DO  Active Child  empagliflozin  (JARDIANCE ) 10 MG TABS tablet [id number] Yes Take 1 tablet (10 mg total) by mouth daily. Tobie Gaines, DO  Active Child  fluticasone -salmeterol (ADVAIR) 250-50 MCG/ACT AEPB [id number]  INHALE 1 PUFF BY MOUTH INTO LUNGS TWICE DAILY IN THE MORNING AND AT BEDTIME Kara Dorn NOVAK, MD  Active Child  glucose blood [facility] ULTRA) test strip [id number]  TEST AS DIRECTED UPTO 3 TIMES DAILY Tobie Gaines, DO  Active Child  insulin  degludec (TRESIBA  FLEXTOUCH) 100 UNIT/ML FlexTouch Pen [id number]  Inject 20 Units into the skin daily. May increase up to 40 units daily if instructed by your provider. Tobie Gaines, DO  Active   insulin  lispro (HUMALOG  KWIKPEN) 100 UNIT/ML KwikPen [id number]  Inject 5 Units into the skin 3 (three) times daily before meals. Tobie Gaines, DO  Active   Insulin  Pen Needle (PEN NEEDLES) 32G X 4 MM MISC [id number]  Use new pen needle for each insulin  injection up to 4 times a day. Elicia Sharper, DO  Active Child  Lancet Device MISC [id number]  1 each by Does not apply route in the morning, at noon, and at bedtime. May substitute to any manufacturer covered by patient's insurance. Jaycee Greig PARAS, NP  Active Child  lidocaine  (LIDODERM ) 5 % [id number]  Place 1 patch onto the skin daily. Remove & Discard patch within 12 hours or as directed by MD   Patient not taking: Reported on 08/08/2024   Tawkaliyar, Roya, DO  Active Child  linaclotide  (LINZESS ) 290 MCG CAPS capsule [id number]  Take 290 mcg by mouth daily as needed (Constipation). [provider]  Active Self, Child  losartan -hydrochlorothiazide (HYZAAR) 100-25 MG tablet [id number]  Take 1 tablet by mouth daily. Tobie Gaines, DO  Active Child  meloxicam  (MOBIC ) 15 MG tablet [id number]  Take 1 tablet (15 mg total) by mouth daily as needed for pain (and inflammation). Gawne, Meghan M, PA-C  Active Child  metFORMIN  (GLUCOPHAGE -XR) 500 MG 24 hr tablet [id number] Yes Take 2 tablets (1,000 mg total) by mouth 2 (two) times daily with a meal. Tobie Gaines, DO  Active Child  metoCLOPramide  (REGLAN ) 10 MG tablet [id number]  Take 1 tablet (10 mg total) by mouth every 8 (eight) hours as needed for nausea. Tobie Gaines, DO  Active Child  mineral oil-hydrophilic petrolatum  (AQUAPHOR) ointment [id number]  Apply topically as needed for dry skin. Azadegan, Maryam, MD  Active Child           Med Note CHRISTIE ALYSON Schaumann Aug 08, 2024  4:18 PM) Ran out  omeprazole  (PRILOSEC) 40 MG capsule [id number]  TAKE 1 CAPSULE(40 MG) BY MOUTH Twice DAILY BEFORE BREAKFAST  Patient taking differently: Take 40 mg by mouth daily as needed (Heartburn).   Tobie Gaines, DO  Active Child  ondansetron  (ZOFRAN -ODT) 4 MG disintegrating tablet [id number]  Take 1 tablet (4 mg total) by mouth every 8 (eight) hours as needed for nausea or vomiting.  Patient not taking: Reported on 08/08/2024   Gawne, Meghan M, PA-C  Active Child  oxyCODONE -acetaminophen  (PERCOCET/ROXICET) 5-325 MG tablet [id number]  Take 1 tablet by mouth every 4 (four) hours as needed. Harden Jerona GAILS, MD  Active   polyethylene glycol powder (GLYCOLAX /MIRALAX ) 17 GM/SCOOP powder [id number]  Take 17 g by mouth 2 (two) times daily. Until daily soft stools OTC  Patient not taking: Reported on 08/08/2024   Vicky Charleston, DEVONNA  Active Child    Discontinued 08/19/24  1454 (Side effect (s))          Med Note VERLENA, GRETA   Fri Aug 09, 2024 10:51 AM) Not taking  traZODone  (DESYREL ) 50 MG tablet [id number]  Take 1 tablet (50 mg total) by mouth at bedtime as needed for sleep. Tobie Gaines, DO  Active               Assessment/Plan:   Diabetes: - Currently uncontrolled with most recent A1C of 8.6% above goal <7% and worsened from 8.1% in May 2025 in the setting of discontinuing Mounjaro  due to GI AE. She was not able to tolerate Ozempic  either, with nausea, vomiting, and worsening dysphagia, though patient reports symptoms are largely unchanged since she stopped Ozempic . To be cautious, would not recommend re-trial of GLP-1RA or GLP-1/GIP RA at this time. Unable to safely adjust basal or prandial insulin  doses as she has not been monitoring her blood sugar. Patient agreeable to monitor and review values in 2 weeks.  - Last UACR Sept 2025 - 23 mg/g - Reviewed long term cardiovascular and renal outcomes of uncontrolled blood sugar - Reviewed goal A1c, goal fasting, and goal 2 hour post prandial glucose - Reviewed hypoglycemia management plan and the rule of 15 - Recommend to continue Tresiba  at 20 units daily - Recommend to restart Humalog  5 units TID with meals. Collaborated with PCP to place orders for refill today. - Recommend to continue metformin  XR 1000 mg BID. Recommend repeat BMP at PCP appt in Dec to determine if dose reduction  is needed if eGFR < 45 mL/min.  - Recommend to continue Jardiance  10 mg daily. Recommend increasing to 25 mg daily once glycemic control improves.  - Recommend to check glucose continuously using CGM - will send instructions via MyChart today. We have discussed this multiple times. I offered in person appt to set up CGM, which patient declined.  - Next A1C due Dec 2025     Hypertension: - Typically controlled with clinic BP below goal less than 130/80, however last couple readings were elevated in the setting of recent  fracture and pain. Continue to monitor. - Recommend to continue losartan -hydrochlorothiazide 100-25 mg daily     Hyperlipidemia/ASCVD Risk Reduction: - Currently uncontrolled with most recent LDL-C of 111 mg/dL above goal < 70 mg/dL given U7IF + comorbidties. Patient is prescribed moderate intensity statin. Appears she restarted after period of nonadherence in May. Should consider escalating to high intensity statin at follow-up, given presence of multiple cardiac risk factors. Did not discuss today given focus on DM. - Recommend to continue atorvastatin  10 mg daily    Written patient instructions provided. Patient verbalized understanding of treatment plan.    Follow Up Plan:  Pharmacist telephone 09/02/24 PCP clinic visit 09/20/24   Lorain Baseman, PharmD [facility] [phone]

## 2024-08-20 NOTE — Telephone Encounter (Signed)
 I called and sw therapist she said that the pt has been WTBAT in fx boot and advised that she will continue this for another week and then transition to a regular shoe. Advised that stitches were removed yesterday and steri strip applied to the incision and advised pt that she should elevate and use compression. Verbal ok for orders as requested by PT and will call with any questions. Advised she will be in the fx boot x 1 more week and then can transition to a regular shoe.

## 2024-08-21 ENCOUNTER — Ambulatory Visit (HOSPITAL_BASED_OUTPATIENT_CLINIC_OR_DEPARTMENT_OTHER)
Admission: RE | Admit: 2024-08-21 | Discharge: 2024-08-21 | Disposition: A | Discharge status: Home / Self Care | Source: Ambulatory Visit | Attending: Internal Medicine | Admitting: Internal Medicine

## 2024-08-21 DIAGNOSIS — Z87891 Personal history of nicotine dependence: Secondary | ICD-10-CM | POA: Insufficient documentation

## 2024-08-21 DIAGNOSIS — I700 Atherosclerosis of aorta: Secondary | ICD-10-CM | POA: Diagnosis not present

## 2024-08-21 DIAGNOSIS — I2510 Atherosclerotic heart disease of native coronary artery without angina pectoris: Secondary | ICD-10-CM | POA: Insufficient documentation

## 2024-08-21 DIAGNOSIS — J439 Emphysema, unspecified: Secondary | ICD-10-CM | POA: Diagnosis not present

## 2024-08-21 DIAGNOSIS — Z122 Encounter for screening for malignant neoplasm of respiratory organs: Secondary | ICD-10-CM | POA: Diagnosis present

## 2024-08-27 ENCOUNTER — Other Ambulatory Visit: Payer: Self-pay | Admitting: Student

## 2024-08-30 ENCOUNTER — Telehealth: Payer: Self-pay | Admitting: Student

## 2024-08-30 ENCOUNTER — Other Ambulatory Visit: Payer: Self-pay | Admitting: *Deleted

## 2024-08-30 DIAGNOSIS — Z87891 Personal history of nicotine dependence: Secondary | ICD-10-CM

## 2024-08-30 DIAGNOSIS — Z122 Encounter for screening for malignant neoplasm of respiratory organs: Secondary | ICD-10-CM

## 2024-08-30 NOTE — Telephone Encounter (Unsigned)
 Copied from CRM [id number]. Topic: Clinical - Prescription Issue >> Aug 30, 2024 12:44 PM Kara Kelley wrote: Reason for CRM: Patient is calling in stating that her insurance is no longer covering the Blood Glucose Monitoring Suppl (ONETOUCH VERIO REFLECT) w/Device KIT [[id number]]. Patient is asking to switch to the Accu Check, patient will need the strips as well. Patient has been without it for about a week. Please advise the patient of any updates.

## 2024-09-02 ENCOUNTER — Encounter: Payer: Self-pay | Admitting: Physician Assistant

## 2024-09-02 ENCOUNTER — Telehealth: Payer: Self-pay | Admitting: Dietician

## 2024-09-02 ENCOUNTER — Other Ambulatory Visit

## 2024-09-02 ENCOUNTER — Ambulatory Visit: Payer: Self-pay

## 2024-09-02 ENCOUNTER — Ambulatory Visit: Admitting: Physician Assistant

## 2024-09-02 DIAGNOSIS — T847XXA Infection and inflammatory reaction due to other internal orthopedic prosthetic devices, implants and grafts, initial encounter: Secondary | ICD-10-CM

## 2024-09-02 NOTE — Progress Notes (Signed)
 Office Visit Note   Patient: Kara Kelley           Date of Birth: [birth year]           MRN: [id number] Visit Date: 09/02/2024              Requested by: Tobie Gaines, DO [address], [address] [place],  [place] [address] PCP: Tobie Gaines, DO  Chief Complaint  Patient presents with   Left Ankle - Routine Post Op    08/09/24 Left ankle debridement and hardware removal.      HPI:  ARNIE MAIOLO is a  68 y.o. female that sustained a bimalleolar fracture that was repaired by Dr. Velinda Chancy on 07/02/24.  She was sent to our office for consultation due to infected hardware.   The hardware was removed on 08/09/24.  Her original ORIF was performed on 07/02/24.  She is now 7 and a half weeks s/p ankle repair.   Sutures were removed on the last visit.  We will take an x ray today.   Cultures grew STAPHYLOCOCCUS LUGDUNENSIS she was prescribed Doxycycline  oral BID 100 mg.  She denies fever and chills.     Assessment & Plan: Visit Diagnoses:  1. Hardware complicating wound infection, initial encounter     Plan: Elevate to decrease swelling.  Perform active flexion extension of the ankle daily.  Will have them use Vashe to do wet to dry dressing changes for the superficial separation of the distal incision.  Continue to wean out of the cam boot into a comfortable shoe.    Follow-Up Instructions: Return in about 3 weeks (around 09/23/2024). They will call if they have concerns.  Ortho Exam  Patient is alert, oriented, no adenopathy, well-dressed, normal affect, normal respiratory effort. Left lateral ankle with edema and superficial incision separation.  No cellulitis or active drainage.  Palpable DP pulse.  Good ankle ROM Plantar/dorsiflexion.            Imaging: Mortis intact, the fibula is well aligned and the medial malleolus fracture in is good alignment.  No widening of the syndesmotic ligament.     Labs: Lab Results  Component Value Date   HGBA1C 8.6 (A) 06/19/2024    HGBA1C 8.1 (A) 02/27/2024   HGBA1C 7.4 (A) 11/01/2023   ESRSEDRATE 22 03/12/2020   ESRSEDRATE 71 (H) 07/18/2017   CRP 20.1 (H) 07/17/2017   REPTSTATUS 08/14/2024 FINAL 08/09/2024   GRAMSTAIN  08/09/2024    NO WBC SEEN NO ORGANISMS SEEN RED BLOOD CELLS PRESENT    CULT  08/09/2024    RARE STAPHYLOCOCCUS LUGDUNENSIS NO ANAEROBES ISOLATED Performed at [facility], [address], [place], [place] [address]    [facility] STAPHYLOCOCCUS LUGDUNENSIS 08/09/2024     Lab Results  Component Value Date   ALBUMIN 3.4 (L) 08/09/2024   ALBUMIN 4.3 06/21/2024   ALBUMIN 3.6 03/24/2024    Lab Results  Component Value Date   MG 1.6 12/21/2023   MG 1.6 (L) 08/25/2022   MG 1.7 09/24/2019   Lab Results  Component Value Date   VD25OH 53 05/15/2009    No results found for: PREALBUMIN    Latest Ref Rng & Units 08/09/2024   10:42 AM 06/21/2024    3:11 PM 06/21/2024    2:46 PM  CBC EXTENDED  WBC 4.0 - 10.5 K/uL 5.4   8.7   RBC 3.87 - 5.11 MIL/uL 4.19   5.02   Hemoglobin 12.0 - 15.0  g/dL 89.5  86.0  87.4   HCT 36.0 - 46.0 % 32.8  41.0  38.4   Platelets 150 - 400 K/uL 192   194   NEUT# 1.7 - 7.7 K/uL   6.1   Lymph# 0.7 - 4.0 K/uL   2.2      There is no height or weight on file to calculate BMI.  Orders:  Orders Placed This Encounter  Procedures   XR Ankle Complete Left   No orders of the defined types were placed in this encounter.    Procedures: No procedures performed  Clinical Data: No additional findings.  ROS:  All other systems negative, except as noted in the HPI. Review of Systems  Objective: Vital Signs: There were no vitals taken for this visit.  Specialty Comments:  No specialty comments available.  PMFS History: Patient Active Problem List   Diagnosis Date Noted   Infected hardware in left leg, initial encounter 08/10/2024   Hardware complicating wound infection 08/09/2024   Ankle wound, left, initial encounter 08/09/2024   Itching in the  vaginal area 04/18/2024   Polypharmacy 02/27/2024   Hyperlipidemia 02/27/2024   Falls, subsequent encounter 02/07/2024   Anemia 12/21/2023   Insomnia 11/22/2023   Osteoporosis 11/09/2023   Neck pain 08/23/2023   Spinal stenosis, lumbar 08/24/2022   Uncontrolled type 2 diabetes mellitus on insulin  (HCC) 04/11/2022   Morbid obesity (HCC) 04/11/2022   Polysubstance abuse (HCC) 04/11/2022   CKD stage 3b, GFR 30-44 ml/min (HCC) 04/2022   Hyperthyroidism 12/29/2021   Cervical spondylosis with myelopathy and radiculopathy 10/26/2021   Hypertensive retinopathy of both eyes 03/06/2019   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) 03/06/2019   Essential hypertension 10/05/2018   Chronic obstructive pulmonary disease (HCC) 10/05/2018   Chronic hepatitis C (HCC) 12/09/2015   Gastroesophageal reflux disease 05/22/2014   Past Medical History:  Diagnosis Date   Anemia    Anxiety    Arthritis    Asthma    Bacterial vaginosis 05/16/2022   Bunion    Callus    Candida vaginitis 09/16/2022   Cholelithiasis without obstruction 04/11/2022   Chronic pain    Cocaine abuse (HCC)    COPD (chronic obstructive pulmonary disease) (HCC)    Corns and callosities    Degenerative joint disease    Depression    Diabetes mellitus    Endometrial polyp    ETOH abuse    Gall stones    Gallbladder calculus with acute cholecystitis and no obstruction 04/11/2022   GERD (gastroesophageal reflux disease)    Headache    history of Migraines   Hepatitis C    Hep C   History of adenomatous polyp of colon 04/11/2022   History of endometrial ablation 02/06/2013   Hyperlipidemia    Hypertension    Insomnia    Spondylolisthesis of lumbar region    Substance abuse (HCC)    alcoholism   Tuberculosis 1985   Wears dentures    Wears glasses     Family History  Problem Relation Age of Onset   Heart disease Mother    Diabetes Other        mat great aunt   Cirrhosis Other        mat great  aunt   Cirrhosis Other        mat great uncles x 2   Colon cancer Neg Hx    Breast cancer Neg Hx     Past Surgical History:  Procedure Laterality Date  ANTERIOR CERVICAL DECOMP/DISCECTOMY FUSION N/A 10/26/2021   Procedure: ACDF - C4-C5 - C5-C6 - C6-C7;  Surgeon: Louis Shove, MD;  Location: [facility];  Service: Neurosurgery;  Laterality: N/A;   BACK SURGERY  2018   CESAREAN SECTION     x3   CHOLECYSTECTOMY N/A 08/11/2016   Procedure: LAPAROSCOPIC CHOLECYSTECTOMY WITH   INTRAOPERATIVE CHOLANGIOGRAM;  Surgeon: Jina Nephew, MD;  Location: [facility];  Service: General;  Laterality: N/A;   COLONOSCOPY     Cotton Osteotomy w/ Graft Left 06/18/2009   Excision of Benign Lesion Right 01/30/2013   Rt Plantar   FOOT SURGERY     HAMMER TOE REPAIR Right 02/12/2016   RIGHT #5   Hammertoe Repair Left 06/18/2009   Lt #5   HARDWARE REMOVAL Left 08/09/2024   Procedure: DEBRIDEMENT LEFT ANKLE AND REMOVAL OF HARDWARE;  Surgeon: Harden Jerona GAILS, MD;  Location: [facility] OR;  Service: Orthopedics;  Laterality: Left;  HARDWARE REMOVAL LEFT ANKLE   HYSTEROSCOPY N/A 06/20/2017   Procedure: DILATION AND CURETTAGE, HYSTEROSCOPY w/ Polypectomy;  Surgeon: Corene Coy, MD;  Location: [facility];  Service: Gynecology;  Laterality: N/A;   LUMBAR FUSION  2018   METATARSAL OSTEOTOMY Left 06/18/2009   #5   MULTIPLE TOOTH EXTRACTIONS     Nail Matrixectomy Left 06/18/2009   LT #1   ORIF ANKLE FRACTURE Left 07/02/2024   Procedure: OPEN REDUCTION INTERNAL FIXATION (ORIF) ANKLE FRACTURE;  Surgeon: Beverley Evalene BIRCH, MD;  Location: [facility];  Service: Orthopedics;  Laterality: Left;   OSTEOTOMY Right 01/30/2013   Rt #5   Phalangectomy Left 06/18/2009   LT #1   Phalangectomy Right 01/30/2013   Rt #1   TUBAL LIGATION     Social History   Occupational History   Occupation: disablily  Tobacco Use   Smoking status: Former    Current packs/day: 0.00    Average packs/day: 0.8 packs/day for 47.0 years (35.3 ttl  pk-yrs)    Types: Cigarettes    Start date: 79    Quit date: 2020    Years since quitting: 5.9    Passive exposure: Past   Smokeless tobacco: Never  Vaping Use   Vaping status: Never Used  Substance and Sexual Activity   Alcohol use: No    Comment: no ETOH 6 yrs   Drug use: No    Comment: in rehab   Sexual activity: Not Currently    Birth control/protection: Post-menopausal

## 2024-09-02 NOTE — Progress Notes (Deleted)
 09/02/2024 Name: Kara Kelley MRN: [id number] DOB: [birth year]  No chief complaint on file.   Kara Kelley is a 68 y.o. year old female who presented for a telephone visit.   They were referred to the pharmacist by their PCP for assistance in managing diabetes and polypharmacy. PMH includes HTN, COPD, hepatitis C, GERD, T2DM with retinopathy, osteoporosis, CKD3, HLD.    Subjective: Patient was seen by PCP, Libby Blanch, DO, on 06/05/24. At last visit, BP was controlled on losartan -hydrochlorothiazide . Her last A1C was 8.1% in May, increased from 7.4%. Previously, she was transitioned from Ozempic  1 mg to Mounjaro  2.5 mg weekly, but at her most recent visit she reported not tolerating Mounjaro  and was instructed to restart Ozempic  at 0.25 mg weekly. She was instructed to continue Tresiba , Humalog , and metformin  as previously prescribed. She was outreached by pharmacy via telephone on 04/19/24, but outreach was unsuccessful. Her pharmacy appt has been rescheduled multiple times. She was seen by Dr. Harrie on 06/19/24. A1C increased to 8.6%. She was instructed to titrate Ozempic . eGFR has remained < 45 mL/min on past 2 BMPs. She had a fall on 06/21/24 with L ankle fracture, and underwent surgery on 07/02/24. She was engaged by pharmacy via telephone on 07/08/24. She was instructed to start Ozempic  at 0.25 mg x4 weeks, then increase to 0.5 mg weekly. She reported her daughter would be able to help her set up CGM monitoring. She came to [facility] to see Dr. Blanch on 07/24/24, reporting worsened abdominal pain and nausea/vomiting. Also reported 2-3 mo hx of dysphagia. Ozempic  was discontinued and omeprazole  was increased to 40 mg twice daily. She was also referred to GI for an endoscopy.  Today, patient reports doing ok. She has been staying with her sister for the past week, after she was readmitted for an infection following ankle surgery. She has not had gluocometer with her for the past week. Reports she has  been taking Tresiba  but has been out of Humalog  for a week. She did stop Ozempic  as previously instructed. Reports she has continued to have nausea. No vomiting. States she cannot tell a difference in GI AE since stopping Ozempic . Appears she did seen GI on 08/08/24, but unable to view note.    Care Team: Primary Care Provider: Doyal Miyamoto, MD on 09/20/24   Medication Access/Adherence  Current Pharmacy:  [facility] [phone] - [place] - [address] [facility] [address] [place] [address] Phone: [phone] Fax: [phone]   Patient reports affordability concerns with their medications: No  Patient reports access/transportation concerns to their pharmacy: Yes  - Daughter usually takes her to [facility] to pick up her medications. Declines transition to mail order pharmacy at this time.  Patient reports adherence concerns with their medications:  Yes  - has been out of Humalog  for one week   Diabetes:  Current medications: Tresiba  15 units BID (states she takes 20 units daily around midday), Humalog  5 units TID with meals (was taking as prescribed prior to running out), metformin  XR 1000 mg BID, Jardiance  10 mg daily  Medications tried in the past: Mounjaro  - GI AE, Ozempic  - GI AE (worsened dysphagia, abdominal pain, n/v)  Current glucose readings: Reports that she is not currently checking. Has One Touch meter at home. Also has Freestyle Libre sensors that she has not set up yet. Reports she has an Android phone, and she is going to see if  her daughter or sister can help her set these up.  When she was at home, stated she was checking 2-3 times per day, recalls readings of 140-150 mg/dL.   Patient denies hypoglycemic s/sx including dizziness, shakiness, sweating. Patient denies hyperglycemic symptoms including polyuria, polydipsia, polyphagia, nocturia, neuropathy, blurred vision.  Current meal patterns: did not discuss  in depth today  Current physical activity: limited by ankle fracture currently s/p surgical intervention   Current medication access support: [employer] Dual Complete  Hypertension:  Current medications:  losartan -hydrochlorothiazide  100-25 mg daily  Hyperlipidemia/ASCVD Risk Reduction  Current lipid lowering medications: atorvastatin  10 mg daily  Antiplatelet regimen: aspirin  81 mg daily  ASCVD History: none known Family History: heart disease in mother Risk Factors: HTN, T2DM, LDL > 100 mg/dL, former smoker  Clinical ASCVD: No  The 10-year ASCVD risk score (Arnett DK, et al., 2019) is: 22%   Values used to calculate the score:     Age: 13 years     Clincally relevant sex: Female     Is Non-Hispanic African American: Yes     Diabetic: Yes     Tobacco smoker: No     Systolic Blood Pressure: 134 mmHg     Is BP treated: Yes     HDL Cholesterol: 58 mg/dL     Total Cholesterol: 184 mg/dL    Objective:  BP Readings from Last 3 Encounters:  08/13/24 134/83  07/24/24 (!) 142/82  07/02/24 (!) 147/72    Lab Results  Component Value Date   HGBA1C 8.6 (A) 06/19/2024   HGBA1C 8.1 (A) 02/27/2024   HGBA1C 7.4 (A) 11/01/2023       Latest Ref Rng & Units 08/09/2024   10:42 AM 06/21/2024    3:11 PM 06/21/2024    2:46 PM  BMP  Glucose 70 - 99 mg/dL 897  863  864   BUN 8 - 23 mg/dL 6  11  10    Creatinine 0.44 - 1.00 mg/dL 9.10  8.59  8.64   Sodium 135 - 145 mmol/L 139  137  134   Potassium 3.5 - 5.1 mmol/L 4.3  3.7  3.6   Chloride 98 - 111 mmol/L 108  100  98   CO2 22 - 32 mmol/L 19   22   Calcium  8.9 - 10.3 mg/dL 8.2   9.3     Lab Results  Component Value Date   CHOL 184 06/19/2024   HDL 58 06/19/2024   LDLCALC 111 (H) 06/19/2024   TRIG 84 06/19/2024   CHOLHDL 3.2 06/19/2024    Medications Reviewed Today   Medications were not reviewed in this encounter       Assessment/Plan:   Diabetes: - Currently uncontrolled with most recent A1C of 8.6% above goal <7% and  worsened from 8.1% in May 2025 in the setting of discontinuing Mounjaro  due to GI AE. She was not able to tolerate Ozempic  either, with nausea, vomiting, and worsening dysphagia, though patient reports symptoms are largely unchanged since she stopped Ozempic . To be cautious, would not recommend re-trial of GLP-1RA or GLP-1/GIP RA at this time. Unable to safely adjust basal or prandial insulin  doses as she has not been monitoring her blood sugar. Patient agreeable to monitor and review values in 2 weeks.  - Last UACR Sept 2025 - 23 mg/g - Reviewed long term cardiovascular and renal outcomes of uncontrolled blood sugar - Reviewed goal A1c, goal fasting, and goal 2 hour post prandial glucose - Reviewed hypoglycemia management plan and  the rule of 15 - Recommend to continue Tresiba  at 20 units daily - Recommend to restart Humalog  5 units TID with meals. Collaborated with PCP to place orders for refill today. - Recommend to continue metformin  XR 1000 mg BID. Recommend repeat BMP at PCP appt in Dec to determine if dose reduction is needed if eGFR < 45 mL/min.  - Recommend to continue Jardiance  10 mg daily. Recommend increasing to 25 mg daily once glycemic control improves.  - Recommend to check glucose continuously using CGM - will send instructions via MyChart today. We have discussed this multiple times. I offered in person appt to set up CGM, which patient declined.  - Next A1C due Dec 2025     Hypertension: - Typically controlled with clinic BP below goal less than 130/80, however last couple readings were elevated in the setting of recent fracture and pain. Continue to monitor. - Recommend to continue losartan -hydrochlorothiazide  100-25 mg daily     Hyperlipidemia/ASCVD Risk Reduction: - Currently uncontrolled with most recent LDL-C of 111 mg/dL above goal < 70 mg/dL given U7IF + comorbidties. Patient is prescribed moderate intensity statin. Appears she restarted after period of nonadherence in  May. Should consider escalating to high intensity statin at follow-up, given presence of multiple cardiac risk factors. Did not discuss today given focus on DM. - Recommend to continue atorvastatin  10 mg daily    Written patient instructions provided. Patient verbalized understanding of treatment plan.    Follow Up Plan:  Pharmacist telephone 09/02/24 PCP clinic visit 09/20/24   Lorain Baseman, PharmD [facility] [phone]

## 2024-09-02 NOTE — Telephone Encounter (Signed)
 Received a fax from [facility]  med supply requesting ICD 10 code for type 1 or type 2 diabetes, commentary that patient is adhering to the  CGM regimen and diabetes treatment plan and date of last visit mush be within 6 months. Note fax sent back to [facility]  ned from our office dated 08/27/2024 requesting the same things. Request office notes from 06/19/2024 Dr. Harrie visit be faxed back.

## 2024-09-03 ENCOUNTER — Other Ambulatory Visit: Payer: Self-pay | Admitting: Student

## 2024-09-03 MED ORDER — ACCU-CHEK SOFTCLIX LANCETS MISC: 2 refills | Status: AC

## 2024-09-03 MED ORDER — ACCU-CHEK GUIDE W/DEVICE KIT: PACK | 0 refills | Status: DC

## 2024-09-03 NOTE — Telephone Encounter (Signed)
 Medical records has been faxed to [facility]  med supply, confirmation went through.

## 2024-09-09 ENCOUNTER — Telehealth: Payer: Self-pay

## 2024-09-09 ENCOUNTER — Other Ambulatory Visit

## 2024-09-09 NOTE — Telephone Encounter (Signed)
 Attempted to contact patient for telephone appt to review blood sugars and adjust medications. Patient reports that she is on her way to an appt and cannot talk right now. Our telephone appt has now been rescheduled several times. Confirmed that she will be able to attend PCP appt on 09/20/24. Encouraged her to check blood sugar regularly until that appt and bring her glucometer with her for review. Will plan to follow-up after this date.   Kara Kelley, PharmD [facility] [phone]

## 2024-09-09 NOTE — Progress Notes (Deleted)
 09/09/2024 Name: Kara Kelley MRN: [id number] DOB: 1956-07-24  No chief complaint on file.   Kara Kelley is a 68 y.o. year old female who presented for a telephone visit.   They were referred to the pharmacist by their PCP for assistance in managing diabetes and polypharmacy. PMH includes HTN, COPD, hepatitis C, GERD, T2DM with retinopathy, osteoporosis, CKD3, HLD.    Subjective: Patient was seen by PCP, Kara Blanch, DO, on 06/05/24. At last visit, BP was controlled on losartan -hydrochlorothiazide . Her last A1C was 8.1% in May, increased from 7.4%. Previously, she was transitioned from Ozempic  1 mg to Mounjaro  2.5 mg weekly, but at her most recent visit she reported not tolerating Mounjaro  and was instructed to restart Ozempic  at 0.25 mg weekly. She was instructed to continue Tresiba , Humalog , and metformin  as previously prescribed. She was outreached by pharmacy via telephone on 04/19/24, but outreach was unsuccessful. Her pharmacy appt has been rescheduled multiple times. She was seen by Kara Kelley on 06/19/24. A1C increased to 8.6%. She was instructed to titrate Ozempic . eGFR has remained < 45 mL/min on past 2 BMPs. She had a fall on 06/21/24 with L ankle fracture, and underwent surgery on 07/02/24. She was engaged by pharmacy via telephone on 07/08/24. She was instructed to start Ozempic  at 0.25 mg x4 weeks, then increase to 0.5 mg weekly. She reported her daughter would be able to help her set up CGM monitoring. She came to [facility] to see Dr. Blanch on 07/24/24, reporting worsened abdominal pain and nausea/vomiting. Also reported 2-3 mo hx of dysphagia. Ozempic  was discontinued and omeprazole  was increased to 40 mg twice daily. She was also referred to GI for an endoscopy.  Today, patient reports doing ok. She has been staying with her sister for the past week, after she was readmitted for an infection following ankle surgery. She has not had gluocometer with her for the past week. Reports she has  been taking Tresiba  but has been out of Humalog  for a week. She did stop Ozempic  as previously instructed. Reports she has continued to have nausea. No vomiting. States she cannot tell a difference in GI AE since stopping Ozempic . Appears she did seen GI on 08/08/24, but unable to view note.    Care Team: Primary Care Provider: Doyal Miyamoto, MD on 09/20/24   Medication Access/Adherence  Current Pharmacy:  [facility] [phone] - [place], [place] - [address] AT [facility] [address] [place] [address] Phone: [phone] Fax: [phone]   Patient reports affordability concerns with their medications: No  Patient reports access/transportation concerns to their pharmacy: Yes  - Daughter usually takes her to [facility] to pick up her medications. Declines transition to mail order pharmacy at this time.  Patient reports adherence concerns with their medications:  Yes  - has been out of Humalog  for one week   Diabetes:  Current medications: Tresiba  15 units BID (states she takes 20 units daily around midday), Humalog  5 units TID with meals (was taking as prescribed prior to running out), metformin  XR 1000 mg BID, Jardiance  10 mg daily  Medications tried in the past: Mounjaro  - GI AE, Ozempic  - GI AE (worsened dysphagia, abdominal pain, n/v)  Current glucose readings: Reports that she is not currently checking. Has One Touch meter at home. Also has Freestyle Libre sensors that she has not set up yet. Reports she has an Android phone, and she is going to see if  her daughter or sister can help her set these up.  When she was at home, stated she was checking 2-3 times per day, recalls readings of 140-150 mg/dL.   Patient denies hypoglycemic s/sx including dizziness, shakiness, sweating. Patient denies hyperglycemic symptoms including polyuria, polydipsia, polyphagia, nocturia, neuropathy, blurred vision.  Current meal patterns: did not discuss  in depth today  Current physical activity: limited by ankle fracture currently s/p surgical intervention   Current medication access support: UHC Dual Complete  Hypertension:  Current medications:  losartan -hydrochlorothiazide  100-25 mg daily  Hyperlipidemia/ASCVD Risk Reduction  Current lipid lowering medications: atorvastatin  10 mg daily  Antiplatelet regimen: aspirin  81 mg daily  ASCVD History: none known Family History: heart disease in mother Risk Factors: HTN, T2DM, LDL > 100 mg/dL, former smoker  Clinical ASCVD: No  The 10-year ASCVD risk score (Arnett DK, et al., 2019) is: 22%   Values used to calculate the score:     Age: 47 years     Clincally relevant sex: Female     Is Non-Hispanic African American: Yes     Diabetic: Yes     Tobacco smoker: No     Systolic Blood Pressure: 134 mmHg     Is BP treated: Yes     HDL Cholesterol: 58 mg/dL     Total Cholesterol: 184 mg/dL    Objective:  BP Readings from Last 3 Encounters:  08/13/24 134/83  07/24/24 (!) 142/82  07/02/24 (!) 147/72    Lab Results  Component Value Date   HGBA1C 8.6 (A) 06/19/2024   HGBA1C 8.1 (A) 02/27/2024   HGBA1C 7.4 (A) 11/01/2023       Latest Ref Rng & Units 08/09/2024   10:42 AM 06/21/2024    3:11 PM 06/21/2024    2:46 PM  BMP  Glucose 70 - 99 mg/dL 897  863  864   BUN 8 - 23 mg/dL 6  11  10    Creatinine 0.44 - 1.00 mg/dL 9.10  8.59  8.64   Sodium 135 - 145 mmol/L 139  137  134   Potassium 3.5 - 5.1 mmol/L 4.3  3.7  3.6   Chloride 98 - 111 mmol/L 108  100  98   CO2 22 - 32 mmol/L 19   22   Calcium  8.9 - 10.3 mg/dL 8.2   9.3     Lab Results  Component Value Date   CHOL 184 06/19/2024   HDL 58 06/19/2024   LDLCALC 111 (H) 06/19/2024   TRIG 84 06/19/2024   CHOLHDL 3.2 06/19/2024    Medications Reviewed Today   Medications were not reviewed in this encounter       Assessment/Plan:   Diabetes: - Currently uncontrolled with most recent A1C of 8.6% above goal <7% and  worsened from 8.1% in May 2025 in the setting of discontinuing Mounjaro  due to GI AE. She was not able to tolerate Ozempic  either, with nausea, vomiting, and worsening dysphagia, though patient reports symptoms are largely unchanged since she stopped Ozempic . To be cautious, would not recommend re-trial of GLP-1RA or GLP-1/GIP RA at this time. Unable to safely adjust basal or prandial insulin  doses as she has not been monitoring her blood sugar. Patient agreeable to monitor and review values in 2 weeks.  - Last UACR Sept 2025 - 23 mg/g - Reviewed long term cardiovascular and renal outcomes of uncontrolled blood sugar - Reviewed goal A1c, goal fasting, and goal 2 hour post prandial glucose - Reviewed hypoglycemia management plan and  the rule of 15 - Recommend to continue Tresiba  at 20 units daily - Recommend to restart Humalog  5 units TID with meals. Collaborated with PCP to place orders for refill today. - Recommend to continue metformin  XR 1000 mg BID. Recommend repeat BMP at PCP appt in Dec to determine if dose reduction is needed if eGFR < 45 mL/min.  - Recommend to continue Jardiance  10 mg daily. Recommend increasing to 25 mg daily once glycemic control improves.  - Recommend to check glucose continuously using CGM - will send instructions via MyChart today. We have discussed this multiple times. I offered in person appt to set up CGM, which patient declined.  - Next A1C due Dec 2025     Hypertension: - Typically controlled with clinic BP below goal less than 130/80, however last couple readings were elevated in the setting of recent fracture and pain. Continue to monitor. - Recommend to continue losartan -hydrochlorothiazide  100-25 mg daily     Hyperlipidemia/ASCVD Risk Reduction: - Currently uncontrolled with most recent LDL-C of 111 mg/dL above goal < 70 mg/dL given U7IF + comorbidties. Patient is prescribed moderate intensity statin. Appears she restarted after period of nonadherence in  May. Should consider escalating to high intensity statin at follow-up, given presence of multiple cardiac risk factors. Did not discuss today given focus on DM. - Recommend to continue atorvastatin  10 mg daily    Written patient instructions provided. Patient verbalized understanding of treatment plan.    Follow Up Plan:  Pharmacist telephone 09/02/24 PCP clinic visit 09/20/24   Kara Kelley, PharmD [facility] [phone]

## 2024-09-12 ENCOUNTER — Telehealth: Payer: Self-pay | Admitting: *Deleted

## 2024-09-12 NOTE — Telephone Encounter (Signed)
 Copied from CRM #[id number]. Topic: General - Other >> Sep 09, 2024  1:51 PM Cherylann S wrote: Reason for CRM: Patient called to verify a time she could drop off paperwork. Informed her that she could do so anytime >> Sep 12, 2024  2:37 PM Miquel SAILOR wrote: PT requesting pick up FL2 forms wil pick up 12/04 or 12/05. No time frame but I let them know Mon-Thurs 8-4:30pm, Friday 8-12 noon Closed for Lunch Mon-Thurs 12:00pm-1:00pm [phone]

## 2024-09-12 NOTE — Telephone Encounter (Signed)
 I called and spoke with pt to let her know the form is in the doctor box, and has not been completed. Advised pt she has to keep the appt for 09/20/2024 with Dr. Nguyen to discuss about this form. Advised pt to call back if she needs further assistance.

## 2024-09-12 NOTE — Telephone Encounter (Signed)
 Will froward to C. Shirlean and H. Rcom Copied from CRM [id number]. Topic: General - Other >> Sep 12, 2024  9:34 AM Farrel B wrote: Reason for CRM: Patient called from [phone] Kara Kelley stated she dropped off FL2 form on 09/11/2024 for assistance I advised the patient that documentation can take up to 7-10 business days but she stated she needed asap please call and advise

## 2024-09-13 NOTE — Telephone Encounter (Signed)
 This has already been address on another open encounter.

## 2024-09-19 NOTE — Telephone Encounter (Signed)
 Message forwarded to hme Copied from CRM #[id number]. Topic: General - Other >> Sep 19, 2024  1:54 PM Diannia H wrote: Reason for CRM: Patient called and stated that her daughter will be picking up the application on tomorrow 12/12.

## 2024-09-19 NOTE — Telephone Encounter (Signed)
 Message forwarded to [facility]   Copied from CRM #[id number]. Topic: General - Other >> Sep 19, 2024  1:50 PM Vivian Z wrote: Reason for CRM:  Patient is checking status of FL2 form if it is ready for pickup. Please contact patient to let her know if it is ready for pick up.

## 2024-09-20 ENCOUNTER — Ambulatory Visit

## 2024-09-21 ENCOUNTER — Other Ambulatory Visit (HOSPITAL_BASED_OUTPATIENT_CLINIC_OR_DEPARTMENT_OTHER): Payer: Self-pay | Admitting: Family Medicine

## 2024-09-21 DIAGNOSIS — M810 Age-related osteoporosis without current pathological fracture: Secondary | ICD-10-CM

## 2024-09-23 ENCOUNTER — Encounter: Payer: Self-pay | Admitting: Physician Assistant

## 2024-09-23 ENCOUNTER — Ambulatory Visit: Admitting: Physician Assistant

## 2024-09-23 ENCOUNTER — Other Ambulatory Visit: Payer: Self-pay

## 2024-09-23 DIAGNOSIS — S90512A Abrasion, left ankle, initial encounter: Secondary | ICD-10-CM

## 2024-09-23 DIAGNOSIS — T847XXA Infection and inflammatory reaction due to other internal orthopedic prosthetic devices, implants and grafts, initial encounter: Secondary | ICD-10-CM

## 2024-09-23 DIAGNOSIS — M25572 Pain in left ankle and joints of left foot: Secondary | ICD-10-CM

## 2024-09-23 MED ORDER — AMOXICILLIN-POT CLAVULANATE 875-125 MG PO TABS: 1.0000 | ORAL_TABLET | Freq: Two times a day (BID) | ORAL | 0 refills | Status: AC

## 2024-09-23 NOTE — Progress Notes (Signed)
 Office Visit Note   Patient: Kara Kelley           Date of Birth: Jan 31, 1956           MRN: [id number] Visit Date: 09/23/2024              Requested by: Tobie Gaines, DO [address] [place],  [place] [address] PCP: Ricky Alfrieda DASEN, DO  Chief Complaint  Patient presents with   Left Ankle - Routine Post Op    08/09/24 Left ankle debridement and hardware removal.      HPI: Kara Kelley is a  68 y.o. female that sustained a bimalleolar fracture that was repaired by Dr. Velinda Chancy on 07/02/24.  She was sent to our office for consultation due to infected hardware.   The hardware was removed on 08/09/24.  Her original ORIF was performed on 07/02/24.  She is now 7 and a half weeks s/p ankle repair.              Sutures were removed on the last visit.  We will take an x ray today.   Cultures grew STAPHYLOCOCCUS LUGDUNENSIS she was prescribed Doxycycline  oral BID 100 mg.  She denies fever and chills.  Assessment & Plan: Visit Diagnoses:  1. Pain in left ankle and joints of left foot   2. Abrasion of left ankle without infection, initial encounter   3. Hardware complicating wound infection, initial encounter     Plan: return to the OR for irrigation and debridement of the left lateral infected ankle.  She was on Doxycycline  after her previous surgery.  We reviewed the sensitives and placed on her Augmentin  until surgery.    Follow-Up Instructions: Return in about 2 days (around 09/25/2024) for surgery plan for 12/.   Ortho Exam  Patient is alert, oriented, no adenopathy, well-dressed, normal affect, normal respiratory effort. On exam she has increase lateral ankle edema with purulent drainage.  She has significant tenderness to palpation over the lateral ankle.  Flexion and extension of the ankle are intact.  She has a palpable DP pulse.    Xray taken shows no bony malalignment.        Imaging: No results found. No images are attached to the  encounter.  Labs: Lab Results  Component Value Date   HGBA1C 8.6 (A) 06/19/2024   HGBA1C 8.1 (A) 02/27/2024   HGBA1C 7.4 (A) 11/01/2023   ESRSEDRATE 22 03/12/2020   ESRSEDRATE 71 (H) 07/18/2017   CRP 20.1 (H) 07/17/2017   REPTSTATUS 08/14/2024 FINAL 08/09/2024   GRAMSTAIN  08/09/2024    NO WBC SEEN NO ORGANISMS SEEN RED BLOOD CELLS PRESENT    CULT  08/09/2024    RARE STAPHYLOCOCCUS LUGDUNENSIS NO ANAEROBES ISOLATED Performed at [facility], [address] [place], [place] [address]    [facility] STAPHYLOCOCCUS LUGDUNENSIS 08/09/2024     Lab Results  Component Value Date   ALBUMIN 3.4 (L) 08/09/2024   ALBUMIN 4.3 06/21/2024   ALBUMIN 3.6 03/24/2024    Lab Results  Component Value Date   MG 1.6 12/21/2023   MG 1.6 (L) 08/25/2022   MG 1.7 09/24/2019   Lab Results  Component Value Date   VD25OH 53 05/15/2009    No results found for: PREALBUMIN    Latest Ref Rng & Units 08/09/2024   10:42 AM 06/21/2024    3:11 PM 06/21/2024    2:46 PM  CBC EXTENDED  WBC 4.0 - 10.5 K/uL  5.4   8.7   RBC 3.87 - 5.11 MIL/uL 4.19   5.02   Hemoglobin 12.0 - 15.0 g/dL 89.5  86.0  87.4   HCT 36.0 - 46.0 % 32.8  41.0  38.4   Platelets 150 - 400 K/uL 192   194   NEUT# 1.7 - 7.7 K/uL   6.1   Lymph# 0.7 - 4.0 K/uL   2.2      There is no height or weight on file to calculate BMI.  Orders:  Orders Placed This Encounter  Procedures   XR Ankle Complete Left   Meds ordered this encounter  Medications   amoxicillin -clavulanate (AUGMENTIN ) 875-125 MG tablet    Sig: Take 1 tablet by mouth 2 (two) times daily.    Dispense:  14 tablet    Refill:  0    Supervising Provider:   DUDA, MARCUS V [[id number]]     Procedures: No procedures performed  Clinical Data: No additional findings.  ROS:  All other systems negative, except as noted in the HPI. Review of Systems  Objective: Vital Signs: There were no vitals taken for this visit.  Specialty Comments:  No specialty comments  available.  PMFS History: Patient Active Problem List   Diagnosis Date Noted   Infected hardware in left leg, initial encounter 08/10/2024   Hardware complicating wound infection 08/09/2024   Ankle wound, left, initial encounter 08/09/2024   Itching in the vaginal area 04/18/2024   Polypharmacy 02/27/2024   Hyperlipidemia 02/27/2024   Falls, subsequent encounter 02/07/2024   Anemia 12/21/2023   Insomnia 11/22/2023   Osteoporosis 11/09/2023   Neck pain 08/23/2023   Spinal stenosis, lumbar 08/24/2022   Uncontrolled type 2 diabetes mellitus on insulin  (HCC) 04/11/2022   Morbid obesity (HCC) 04/11/2022   Polysubstance abuse (HCC) 04/11/2022   CKD stage 3b, GFR 30-44 ml/min (HCC) 04/2022   Hyperthyroidism 12/29/2021   Cervical spondylosis with myelopathy and radiculopathy 10/26/2021   Hypertensive retinopathy of both eyes 03/06/2019   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) 03/06/2019   Essential hypertension 10/05/2018   Chronic obstructive pulmonary disease (HCC) 10/05/2018   Chronic hepatitis C (HCC) 12/09/2015   Gastroesophageal reflux disease 05/22/2014   Past Medical History:  Diagnosis Date   Anemia    Anxiety    Arthritis    Asthma    Bacterial vaginosis 05/16/2022   Bunion    Callus    Candida vaginitis 09/16/2022   Cholelithiasis without obstruction 04/11/2022   Chronic pain    Cocaine abuse (HCC)    COPD (chronic obstructive pulmonary disease) (HCC)    Corns and callosities    Degenerative joint disease    Depression    Diabetes mellitus    Endometrial polyp    ETOH abuse    Gall stones    Gallbladder calculus with acute cholecystitis and no obstruction 04/11/2022   GERD (gastroesophageal reflux disease)    Headache    history of Migraines   Hepatitis C    Hep C   History of adenomatous polyp of colon 04/11/2022   History of endometrial ablation 02/06/2013   Hyperlipidemia    Hypertension    Insomnia    Spondylolisthesis of  lumbar region    Substance abuse (HCC)    alcoholism   Tuberculosis 1985   Wears dentures    Wears glasses     Family History  Problem Relation Age of Onset   Heart disease Mother    Diabetes Other  mat great aunt   Cirrhosis Other        mat great aunt   Cirrhosis Other        mat great uncles x 2   Colon cancer Neg Hx    Breast cancer Neg Hx     Past Surgical History:  Procedure Laterality Date   ANTERIOR CERVICAL DECOMP/DISCECTOMY FUSION N/A 10/26/2021   Procedure: ACDF - C4-C5 - C5-C6 - C6-C7;  Surgeon: Louis Shove, MD;  Location: [facility];  Service: Neurosurgery;  Laterality: N/A;   BACK SURGERY  2018   CESAREAN SECTION     x3   CHOLECYSTECTOMY N/A 08/11/2016   Procedure: LAPAROSCOPIC CHOLECYSTECTOMY WITH   INTRAOPERATIVE CHOLANGIOGRAM;  Surgeon: Jina Nephew, MD;  Location: [facility];  Service: General;  Laterality: N/A;   COLONOSCOPY     Cotton Osteotomy w/ Graft Left 06/18/2009   Excision of Benign Lesion Right 01/30/2013   Rt Plantar   FOOT SURGERY     HAMMER TOE REPAIR Right 02/12/2016   RIGHT #5   Hammertoe Repair Left 06/18/2009   Lt #5   HARDWARE REMOVAL Left 08/09/2024   Procedure: DEBRIDEMENT LEFT ANKLE AND REMOVAL OF HARDWARE;  Surgeon: Harden Jerona GAILS, MD;  Location: [facility];  Service: Orthopedics;  Laterality: Left;  HARDWARE REMOVAL LEFT ANKLE   HYSTEROSCOPY N/A 06/20/2017   Procedure: DILATION AND CURETTAGE, HYSTEROSCOPY w/ Polypectomy;  Surgeon: Corene Coy, MD;  Location: [facility];  Service: Gynecology;  Laterality: N/A;   LUMBAR FUSION  2018   METATARSAL OSTEOTOMY Left 06/18/2009   #5   MULTIPLE TOOTH EXTRACTIONS     Nail Matrixectomy Left 06/18/2009   LT #1   ORIF ANKLE FRACTURE Left 07/02/2024   Procedure: OPEN REDUCTION INTERNAL FIXATION (ORIF) ANKLE FRACTURE;  Surgeon: Beverley Evalene BIRCH, MD;  Location: [facility];  Service: Orthopedics;  Laterality: Left;   OSTEOTOMY Right 01/30/2013   Rt #5   Phalangectomy Left 06/18/2009   LT #1    Phalangectomy Right 01/30/2013   Rt #1   TUBAL LIGATION     Social History   Occupational History   Occupation: disablily  Tobacco Use   Smoking status: Former    Current packs/day: 0.00    Average packs/day: 0.8 packs/day for 47.0 years (35.3 ttl pk-yrs)    Types: Cigarettes    Start date: 44    Quit date: 2020    Years since quitting: 5.9    Passive exposure: Past   Smokeless tobacco: Never  Vaping Use   Vaping status: Never Used  Substance and Sexual Activity   Alcohol use: No    Comment: no ETOH 6 yrs   Drug use: No    Comment: in rehab   Sexual activity: Not Currently    Birth control/protection: Post-menopausal

## 2024-09-24 ENCOUNTER — Other Ambulatory Visit: Payer: Self-pay

## 2024-09-24 ENCOUNTER — Encounter (HOSPITAL_COMMUNITY): Payer: Self-pay | Admitting: Orthopedic Surgery

## 2024-09-24 NOTE — Telephone Encounter (Signed)
 Pt daughter came and pick up the original fl2 form.

## 2024-09-24 NOTE — H&P (Signed)
 Kara Kelley is an 68 y.o. female.   Chief Complaint: left ankle infection HPI: 68 y/o female with infected hardware s/p ORIF left displaced bimalleolar fracture of left ankle, Syndesmotic disruption of left ankle by Dr. Velinda Chancy on 07/02/24.  She has developed an ulcer with infection.  She was taken to the OR by Dr. Harden on 08/09/24. She came in for follow up exam and has a fall on the stirs.  X rays did not show any new fractures.  She did have purulent drainage from the lateral ankle incision with edema and tenderness.    Past Medical History:  Diagnosis Date   Anemia    Anxiety    Arthritis    Asthma    Bacterial vaginosis 05/16/2022   Bunion    Callus    Candida vaginitis 09/16/2022   Cholelithiasis without obstruction 04/11/2022   Chronic pain    Cocaine abuse (HCC)    COPD (chronic obstructive pulmonary disease) (HCC)    Corns and callosities    Degenerative joint disease    Depression    Diabetes mellitus    Endometrial polyp    ETOH abuse    Gall stones    Gallbladder calculus with acute cholecystitis and no obstruction 04/11/2022   GERD (gastroesophageal reflux disease)    Headache    history of Migraines   Hepatitis C    Hep C   History of adenomatous polyp of colon 04/11/2022   History of endometrial ablation 02/06/2013   Hyperlipidemia    Hypertension    Insomnia    Spondylolisthesis of lumbar region    Substance abuse (HCC)    alcoholism   Tuberculosis 1985   Wears dentures    Wears glasses     Past Surgical History:  Procedure Laterality Date   ANTERIOR CERVICAL DECOMP/DISCECTOMY FUSION N/A 10/26/2021   Procedure: ACDF - C4-C5 - C5-C6 - C6-C7;  Surgeon: Louis Shove, MD;  Location: [facility];  Service: Neurosurgery;  Laterality: N/A;   BACK SURGERY  2018   CESAREAN SECTION     x3   CHOLECYSTECTOMY N/A 08/11/2016   Procedure: LAPAROSCOPIC CHOLECYSTECTOMY WITH   INTRAOPERATIVE CHOLANGIOGRAM;  Surgeon: Jina Nephew, MD;  Location: [facility];  Service:  General;  Laterality: N/A;   COLONOSCOPY     Cotton Osteotomy w/ Graft Left 06/18/2009   Excision of Benign Lesion Right 01/30/2013   Rt Plantar   FOOT SURGERY     HAMMER TOE REPAIR Right 02/12/2016   RIGHT #5   Hammertoe Repair Left 06/18/2009   Lt #5   HARDWARE REMOVAL Left 08/09/2024   Procedure: DEBRIDEMENT LEFT ANKLE AND REMOVAL OF HARDWARE;  Surgeon: Harden Jerona LULLA, MD;  Location: [facility];  Service: Orthopedics;  Laterality: Left;  HARDWARE REMOVAL LEFT ANKLE   HYSTEROSCOPY N/A 06/20/2017   Procedure: DILATION AND CURETTAGE, HYSTEROSCOPY w/ Polypectomy;  Surgeon: Corene Coy, MD;  Location: [facility];  Service: Gynecology;  Laterality: N/A;   LUMBAR FUSION  2018   METATARSAL OSTEOTOMY Left 06/18/2009   #5   MULTIPLE TOOTH EXTRACTIONS     Nail Matrixectomy Left 06/18/2009   LT #1   ORIF ANKLE FRACTURE Left 07/02/2024   Procedure: OPEN REDUCTION INTERNAL FIXATION (ORIF) ANKLE FRACTURE;  Surgeon: Chancy Evalene BIRCH, MD;  Location: [facility];  Service: Orthopedics;  Laterality: Left;   OSTEOTOMY Right 01/30/2013   Rt #5   Phalangectomy Left 06/18/2009   LT #1   Phalangectomy Right 01/30/2013   Rt #1  TUBAL LIGATION      Family History  Problem Relation Age of Onset   Heart disease Mother    Diabetes Other        mat great aunt   Cirrhosis Other        mat great aunt   Cirrhosis Other        mat great uncles x 2   Colon cancer Neg Hx    Breast cancer Neg Hx    Social History:  reports that she quit smoking about 5 years ago. Her smoking use included cigarettes. She started smoking about 52 years ago. She has a 35.3 pack-year smoking history. She has been exposed to tobacco smoke. She has never used smokeless tobacco. She reports that she does not drink alcohol and does not use drugs.  Allergies: Allergies[1]  No medications prior to admission.    No results found. However, due to the size of the patient record, not all encounters were searched. Please  check Results Review for a complete set of results. No results found.  Review of Systems  All other systems reviewed and are negative.   There were no vitals taken for this visit. Physical Exam  On exam she has increase lateral ankle edema with purulent drainage. She has significant tenderness to palpation over the lateral ankle. Flexion and extension of the ankle are intact. She has a palpable DP pulse.        Assessment/Plan 1. Pain in left ankle and joints of left foot   2. Abrasion of left ankle without infection, initial encounter   3. Hardware complicating wound infection, initial encounter       Plan: return to the OR for irrigation and debridement of the left lateral infected ankle.  She was on Doxycycline  after her previous surgery.  We reviewed the sensitives and placed on her Augmentin  until surgery.    Maurilio Deland Collet, PA-C 09/24/2024, 1:52 PM        [1]  Allergies Allergen Reactions   Mounjaro  [Tirzepatide ] Nausea And Vomiting   Ozempic  (0.25 Or 0.5 Mg-Dose) [Semaglutide (0.25 Or 0.5mg -Dos)] Nausea And Vomiting    Worsened dysphagia

## 2024-09-24 NOTE — Pre-Procedure Instructions (Signed)
 -------------  SDW INSTRUCTIONS given:  Your procedure is scheduled on 12/17.  Report to [facility] Main Entrance A at 08:30 A.M., and check in at the Admitting office.  Any questions or running late day of surgery: call [phone]    Remember:  Do not eat after midnight the night before your surgery  You may drink clear liquids until 08:00 AM the morning of your surgery.   Clear liquids allowed are: Water, Non-Citrus Juices (without pulp), Carbonated Beverages, Clear Tea, Black Coffee Only, and Gatorade    Take these medicines the morning of surgery with A SIP OF WATER  amoxicillin -clavulanate (AUGMENTIN )  atorvastatin  (LIPITOR)  buPROPion  (WELLBUTRIN  XL)  cyclobenzaprine  (FLEXERIL )  doxycycline  (VIBRA -TABS)  DULoxetine  (CYMBALTA )  fluticasone -salmeterol (ADVAIR)    May take these medicines IF NEEDED: acetaminophen  (TYLENOL  8 HOUR)  albuterol  (VENTOLIN  HFA)  omeprazole  (PRILOSEC)  HYDROcodone -acetaminophen  (NORCO)   As of today, STOP taking any Aspirin  (unless otherwise instructed by your surgeon) Aleve , Naproxen , Ibuprofen , Motrin , Advil , Goody's, BC's, all herbal medications, fish oil, and all vitamins.  WHAT DO I DO ABOUT MY DIABETES MEDICATION?   Do not take metFORMIN  (GLUCOPHAGE -XR)   the morning of surgery.  STOP taking empagliflozin  (JARDIANCE ) 72 hours prior to surgery.  The night before surgery, take 10 units (50%) of insulin  degludec (TRESIBA  FLEXTOUCH)     If your CBG is greater than 220 mg/dL, you may take  of your sliding scale (correction) dose of insulin  lispro (HUMALOG  KWIKPEN)   HOW TO MANAGE YOUR DIABETES BEFORE AND AFTER SURGERY  Why is it important to control my blood sugar before and after surgery? Improving blood sugar levels before and after surgery helps healing and can limit problems. A way of improving blood sugar control is eating a healthy diet by:  Eating less sugar and carbohydrates  Increasing activity/exercise  Talking with your  doctor about reaching your blood sugar goals High blood sugars (greater than 180 mg/dL) can raise your risk of infections and slow your recovery, so you will need to focus on controlling your diabetes during the weeks before surgery. Make sure that the doctor who takes care of your diabetes knows about your planned surgery including the date and location.  How do I manage my blood sugar before surgery? Check your blood sugar at least 4 times a day, starting 2 days before surgery, to make sure that the level is not too high or low.  Check your blood sugar the morning of your surgery when you wake up and every 2 hours until you get to the Short Stay unit.  If your blood sugar is less than 70 mg/dL, you will need to treat for low blood sugar: Do not take insulin . Treat a low blood sugar (less than 70 mg/dL) with  cup of clear juice (cranberry or apple), 4 glucose tablets, OR glucose gel. Recheck blood sugar in 15 minutes after treatment (to make sure it is greater than 70 mg/dL). If your blood sugar is not greater than 70 mg/dL on recheck, call [phone] for further instructions. Report your blood sugar to the short stay nurse when you get to Short Stay.  If you are admitted to the hospital after surgery: Your blood sugar will be checked by the staff and you will probably be given insulin  after surgery (instead of oral diabetes medicines) to make sure you have good blood sugar levels. The goal for blood sugar control after surgery is 80-180 mg/dL.    Do NOT Smoke (Tobacco/Vaping) 24 hours  prior to your procedure  If you use a CPAP at night, you may bring all equipment for your overnight stay.     You will be asked to remove any contacts, glasses, piercing's, hearing aid's, dentures/partials prior to surgery. Please bring cases for these items if needed.     Patients discharged the day of surgery will not be allowed to drive home, and someone needs to stay with them for 24  hours.  SURGICAL WAITING ROOM VISITATION Patients may have no more than 2 support people in the waiting area - these visitors may rotate.   Pre-op nurse will coordinate an appropriate time for 1 ADULT support person, who may not rotate, to accompany patient in pre-op.  Children under the age of 56 must have an adult with them who is not the patient and must remain in the main waiting area with an adult.  If the patient needs to stay at the hospital during part of their recovery, the visitor guidelines for inpatient rooms apply.  Please refer to the [facility] website for the visitor guidelines for any additional information.   Special instructions:   - Preparing For Surgery   Please follow these instructions carefully.   Shower the NIGHT BEFORE SURGERY and the MORNING OF SURGERY with DIAL  Soap.   Pat yourself dry with a CLEAN TOWEL.  Wear CLEAN PAJAMAS to bed the night before surgery  Place CLEAN SHEETS on your bed the night of your first shower and DO NOT SLEEP WITH PETS.   Additional instructions for the day of surgery: DO NOT APPLY any lotions, deodorants, cologne, or perfumes.   Do not wear jewelry or makeup Do not wear nail polish, gel polish, artificial nails, or any other type of covering on natural nails (fingers and toes) Do not bring valuables to the hospital. [facility] is not responsible for valuables/personal belongings. Put on clean/comfortable clothes.  Please brush your teeth.  Ask your nurse before applying any prescription medications to the skin.

## 2024-09-24 NOTE — Progress Notes (Signed)
 PCP - Dr. Alfrieda Pillar Cardiologist - denies  PPM/ICD - denies   Chest x-ray - 06/21/24 EKG - 03/23/24 Stress Test - denies ECHO - denies Cardiac Cath - denies  CPAP - denies  Fasting Blood Sugar - 80-100 Checks Blood Sugar three times/day  ASA/Blood Thinner Instructions: n/a   ERAS Protcol - clears until 0800  COVID TEST- n/a  Anesthesia review: no  Patient verbally denies any shortness of breath, fever, cough and chest pain during phone call      Questions were answered. Patient verbalized understanding of instructions.

## 2024-09-25 ENCOUNTER — Encounter (HOSPITAL_COMMUNITY): Payer: Self-pay | Admitting: Orthopedic Surgery

## 2024-09-25 ENCOUNTER — Ambulatory Visit (HOSPITAL_COMMUNITY)

## 2024-09-25 ENCOUNTER — Observation Stay (HOSPITAL_COMMUNITY)
Admission: RE | Admit: 2024-09-25 | Discharge: 2024-09-27 | Disposition: A | Discharge status: Home / Self Care | Attending: Orthopedic Surgery | Admitting: Orthopedic Surgery

## 2024-09-25 ENCOUNTER — Encounter (HOSPITAL_COMMUNITY): Admission: RE | Payer: Self-pay | Source: Home / Self Care

## 2024-09-25 DIAGNOSIS — I10 Essential (primary) hypertension: Secondary | ICD-10-CM | POA: Diagnosis not present

## 2024-09-25 DIAGNOSIS — T81329A Deep disruption or dehiscence of operation wound, unspecified, initial encounter: Secondary | ICD-10-CM | POA: Diagnosis not present

## 2024-09-25 DIAGNOSIS — T8130XA Disruption of wound, unspecified, initial encounter: Secondary | ICD-10-CM

## 2024-09-25 DIAGNOSIS — Z7984 Long term (current) use of oral hypoglycemic drugs: Secondary | ICD-10-CM | POA: Diagnosis not present

## 2024-09-25 DIAGNOSIS — E1122 Type 2 diabetes mellitus with diabetic chronic kidney disease: Secondary | ICD-10-CM

## 2024-09-25 DIAGNOSIS — E119 Type 2 diabetes mellitus without complications: Secondary | ICD-10-CM | POA: Diagnosis not present

## 2024-09-25 DIAGNOSIS — I129 Hypertensive chronic kidney disease with stage 1 through stage 4 chronic kidney disease, or unspecified chronic kidney disease: Secondary | ICD-10-CM

## 2024-09-25 DIAGNOSIS — T84625A Infection and inflammatory reaction due to internal fixation device of left fibula, initial encounter: Secondary | ICD-10-CM | POA: Diagnosis not present

## 2024-09-25 DIAGNOSIS — N1832 Chronic kidney disease, stage 3b: Secondary | ICD-10-CM

## 2024-09-25 DIAGNOSIS — S91002A Unspecified open wound, left ankle, initial encounter: Principal | ICD-10-CM

## 2024-09-25 DIAGNOSIS — Y792 Prosthetic and other implants, materials and accessory orthopedic devices associated with adverse incidents: Secondary | ICD-10-CM | POA: Diagnosis not present

## 2024-09-25 DIAGNOSIS — Z79899 Other long term (current) drug therapy: Secondary | ICD-10-CM | POA: Diagnosis not present

## 2024-09-25 DIAGNOSIS — J4489 Other specified chronic obstructive pulmonary disease: Secondary | ICD-10-CM | POA: Insufficient documentation

## 2024-09-25 DIAGNOSIS — L02416 Cutaneous abscess of left lower limb: Secondary | ICD-10-CM | POA: Diagnosis not present

## 2024-09-25 DIAGNOSIS — L089 Local infection of the skin and subcutaneous tissue, unspecified: Secondary | ICD-10-CM | POA: Diagnosis present

## 2024-09-25 DIAGNOSIS — S90512A Abrasion, left ankle, initial encounter: Secondary | ICD-10-CM | POA: Diagnosis present

## 2024-09-25 DIAGNOSIS — Z87891 Personal history of nicotine dependence: Secondary | ICD-10-CM | POA: Insufficient documentation

## 2024-09-25 DIAGNOSIS — Z794 Long term (current) use of insulin: Secondary | ICD-10-CM | POA: Diagnosis not present

## 2024-09-25 HISTORY — PX: INCISION AND DRAINAGE OF DEEP ABSCESS, ANKLE: SHX7360

## 2024-09-25 LAB — GLUCOSE, CAPILLARY
Glucose-Capillary: 187 mg/dL — ABNORMAL HIGH (ref 70–99)
Glucose-Capillary: 190 mg/dL — ABNORMAL HIGH (ref 70–99)
Glucose-Capillary: 191 mg/dL — ABNORMAL HIGH (ref 70–99)
Glucose-Capillary: 175 mg/dL — ABNORMAL HIGH (ref 70–99)
Glucose-Capillary: 208 mg/dL — ABNORMAL HIGH (ref 70–99)

## 2024-09-25 LAB — CBC WITH DIFFERENTIAL/PLATELET
Abs Immature Granulocytes: 0.03 K/uL (ref 0.00–0.07)
Basophils Absolute: 0 K/uL (ref 0.0–0.1)
Basophils Relative: 1 %
Eosinophils Absolute: 0.3 K/uL (ref 0.0–0.5)
Eosinophils Relative: 4 %
HCT: 31.3 % — ABNORMAL LOW (ref 36.0–46.0)
Hemoglobin: 10.1 g/dL — ABNORMAL LOW (ref 12.0–15.0)
Immature Granulocytes: 0 %
Lymphocytes Relative: 25 %
Lymphs Abs: 1.9 K/uL (ref 0.7–4.0)
MCH: 25.1 pg — ABNORMAL LOW (ref 26.0–34.0)
MCHC: 32.3 g/dL (ref 30.0–36.0)
MCV: 77.9 fL — ABNORMAL LOW (ref 80.0–100.0)
Monocytes Absolute: 0.4 K/uL (ref 0.1–1.0)
Monocytes Relative: 6 %
Neutro Abs: 4.9 K/uL (ref 1.7–7.7)
Neutrophils Relative %: 64 %
Platelets: 174 K/uL (ref 150–400)
RBC: 4.02 MIL/uL (ref 3.87–5.11)
RDW: 16 % — ABNORMAL HIGH (ref 11.5–15.5)
WBC: 7.6 K/uL (ref 4.0–10.5)
nRBC: 0 % (ref 0.0–0.2)

## 2024-09-25 LAB — COMPREHENSIVE METABOLIC PANEL WITH GFR
ALT: 9 U/L (ref 0–44)
AST: 15 U/L (ref 15–41)
Albumin: 3.7 g/dL (ref 3.5–5.0)
Alkaline Phosphatase: 78 U/L (ref 38–126)
Anion gap: 13 (ref 5–15)
BUN: 12 mg/dL (ref 8–23)
CO2: 23 mmol/L (ref 22–32)
Calcium: 9.3 mg/dL (ref 8.9–10.3)
Chloride: 105 mmol/L (ref 98–111)
Creatinine, Ser: 0.87 mg/dL (ref 0.44–1.00)
GFR, Estimated: >60 mL/min (ref >60)
Glucose, Bld: 185 mg/dL — ABNORMAL HIGH (ref 70–99)
Potassium: 2.8 mmol/L — ABNORMAL LOW (ref 3.5–5.1)
Sodium: 141 mmol/L (ref 135–145)
Total Bilirubin: 0.3 mg/dL (ref 0.0–1.2)
Total Protein: 6.3 g/dL — ABNORMAL LOW (ref 6.5–8.1)

## 2024-09-25 LAB — HEMOGLOBIN A1C
Hgb A1c MFr Bld: 6.7 % — ABNORMAL HIGH (ref 4.8–5.6)
Mean Plasma Glucose: 145.59 mg/dL

## 2024-09-25 SURGERY — INCISION AND DRAINAGE OF DEEP ABSCESS, ANKLE
Anesthesia: General | Site: Ankle | Laterality: Left

## 2024-09-25 MED ORDER — ROCURONIUM BROMIDE 10 MG/ML (PF) SYRINGE
PREFILLED_SYRINGE | INTRAVENOUS | Status: AC
  Filled 2024-09-25: qty 10

## 2024-09-25 MED ORDER — ASPIRIN 81 MG PO TBEC: 81.0000 mg | DELAYED_RELEASE_TABLET | Freq: Two times a day (BID) | ORAL | Status: DC

## 2024-09-25 MED ORDER — INSULIN ASPART 100 UNIT/ML IJ SOLN: 0.0000 [IU] | INTRAMUSCULAR | Status: DC | PRN

## 2024-09-25 MED ORDER — INSULIN ASPART 100 UNIT/ML IJ SOLN
0.0000 [IU] | Freq: Three times a day (TID) | INTRAMUSCULAR | Status: DC
  Administered 2024-09-25 – 2024-09-26 (×3): 2 [IU] via SUBCUTANEOUS
  Filled 2024-09-25: qty 2

## 2024-09-25 MED ORDER — FENTANYL CITRATE (PF) 100 MCG/2ML IJ SOLN
INTRAMUSCULAR | Status: AC
  Filled 2024-09-25: qty 2

## 2024-09-25 MED ORDER — ACETAMINOPHEN 10 MG/ML IV SOLN
1000.0000 mg | Freq: Once | INTRAVENOUS | Status: DC | PRN
  Administered 2024-09-25: 14:00:00 1000 mg via INTRAVENOUS

## 2024-09-25 MED ORDER — ACETAMINOPHEN 325 MG PO TABS: 325.0000 mg | ORAL_TABLET | Freq: Four times a day (QID) | ORAL | Status: DC | PRN

## 2024-09-25 MED ORDER — LOSARTAN POTASSIUM 50 MG PO TABS
100.0000 mg | ORAL_TABLET | Freq: Every day | ORAL | Status: DC
  Administered 2024-09-25 – 2024-09-27 (×3): 100 mg via ORAL
  Filled 2024-09-25 (×3): qty 2

## 2024-09-25 MED ORDER — DOCUSATE SODIUM 100 MG PO CAPS
100.0000 mg | ORAL_CAPSULE | Freq: Two times a day (BID) | ORAL | Status: DC
  Administered 2024-09-25 – 2024-09-27 (×4): 100 mg via ORAL
  Filled 2024-09-25 (×4): qty 1

## 2024-09-25 MED ORDER — OXYCODONE HCL 5 MG/5ML PO SOLN: 5.0000 mg | Freq: Once | ORAL | Status: AC | PRN

## 2024-09-25 MED ORDER — POTASSIUM CHLORIDE 10 MEQ/100ML IV SOLN
10.0000 meq | INTRAVENOUS | Status: AC
  Administered 2024-09-25: 11:00:00 10 meq via INTRAVENOUS
  Filled 2024-09-25 (×2): qty 100

## 2024-09-25 MED ORDER — FENTANYL CITRATE (PF) 100 MCG/2ML IJ SOLN
25.0000 ug | INTRAMUSCULAR | Status: DC | PRN
  Administered 2024-09-25: 12:00:00 50 ug via INTRAVENOUS

## 2024-09-25 MED ORDER — ONDANSETRON HCL 4 MG/2ML IJ SOLN
INTRAMUSCULAR | Status: AC
  Filled 2024-09-25: qty 2

## 2024-09-25 MED ORDER — POTASSIUM CHLORIDE 10 MEQ/100ML IV SOLN
INTRAVENOUS | Status: DC | PRN
  Administered 2024-09-25: 12:00:00 10 meq via INTRAVENOUS

## 2024-09-25 MED ORDER — METFORMIN HCL ER 500 MG PO TB24
1000.0000 mg | ORAL_TABLET | Freq: Two times a day (BID) | ORAL | Status: DC
  Administered 2024-09-25 – 2024-09-27 (×4): 1000 mg via ORAL
  Filled 2024-09-25 (×4): qty 2

## 2024-09-25 MED ORDER — TOBRAMYCIN SULFATE 1.2 G IJ SOLR
INTRAMUSCULAR | Status: AC
  Filled 2024-09-25: qty 1.2

## 2024-09-25 MED ORDER — ONDANSETRON HCL 4 MG/2ML IJ SOLN: 4.0000 mg | Freq: Once | INTRAMUSCULAR | Status: DC | PRN

## 2024-09-25 MED ORDER — INSULIN DEGLUDEC 100 UNIT/ML ~~LOC~~ SOPN: 20.0000 [IU] | PEN_INJECTOR | Freq: Every day | SUBCUTANEOUS | Status: DC

## 2024-09-25 MED ORDER — TOBRAMYCIN SULFATE 1.2 G IJ SOLR
INTRAMUSCULAR | Status: DC | PRN
  Administered 2024-09-25: 11:00:00 1.2 g

## 2024-09-25 MED ORDER — HYDROMORPHONE HCL 1 MG/ML IJ SOLN
0.5000 mg | INTRAMUSCULAR | Status: DC | PRN
  Administered 2024-09-25 – 2024-09-26 (×2): 1 mg via INTRAVENOUS
  Filled 2024-09-25 (×2): qty 1

## 2024-09-25 MED ORDER — PIPERACILLIN-TAZOBACTAM 3.375 G IVPB
3.3750 g | Freq: Three times a day (TID) | INTRAVENOUS | Status: DC
  Administered 2024-09-25 – 2024-09-26 (×5): 3.375 g via INTRAVENOUS
  Filled 2024-09-25 (×6): qty 50

## 2024-09-25 MED ORDER — CYCLOBENZAPRINE HCL 10 MG PO TABS
10.0000 mg | ORAL_TABLET | Freq: Every day | ORAL | Status: DC
  Administered 2024-09-25 – 2024-09-27 (×3): 10 mg via ORAL
  Filled 2024-09-25 (×3): qty 1

## 2024-09-25 MED ORDER — ATORVASTATIN CALCIUM 10 MG PO TABS
10.0000 mg | ORAL_TABLET | Freq: Every day | ORAL | Status: DC
  Administered 2024-09-25 – 2024-09-26 (×2): 10 mg via ORAL
  Filled 2024-09-25 (×2): qty 1

## 2024-09-25 MED ORDER — VASHE WOUND IRRIGATION OPTIME
TOPICAL | Status: DC | PRN
  Administered 2024-09-25: 11:00:00 34 [oz_av]

## 2024-09-25 MED ORDER — ACETAMINOPHEN 10 MG/ML IV SOLN
INTRAVENOUS | Status: AC
  Filled 2024-09-25: qty 100

## 2024-09-25 MED ORDER — INSULIN GLARGINE 100 UNIT/ML ~~LOC~~ SOLN
20.0000 [IU] | Freq: Every day | SUBCUTANEOUS | Status: DC
  Administered 2024-09-26 – 2024-09-27 (×2): 20 [IU] via SUBCUTANEOUS
  Filled 2024-09-25 (×2): qty 0.2

## 2024-09-25 MED ORDER — HYDROMORPHONE HCL 1 MG/ML IJ SOLN
0.5000 mg | INTRAMUSCULAR | Status: DC | PRN
  Administered 2024-09-25 (×2): 0.5 mg via INTRAVENOUS

## 2024-09-25 MED ORDER — LINACLOTIDE 145 MCG PO CAPS: 290.0000 ug | ORAL_CAPSULE | Freq: Every day | ORAL | Status: DC | PRN

## 2024-09-25 MED ORDER — LIDOCAINE 2% (20 MG/ML) 5 ML SYRINGE
INTRAMUSCULAR | Status: DC | PRN
  Administered 2024-09-25: 11:00:00 100 mg via INTRAVENOUS

## 2024-09-25 MED ORDER — ONDANSETRON HCL 4 MG PO TABS: 4.0000 mg | ORAL_TABLET | Freq: Four times a day (QID) | ORAL | Status: DC | PRN

## 2024-09-25 MED ORDER — ACETAMINOPHEN 500 MG PO TABS
1000.0000 mg | ORAL_TABLET | Freq: Four times a day (QID) | ORAL | Status: AC
  Administered 2024-09-26 (×2): 1000 mg via ORAL
  Filled 2024-09-25 (×4): qty 2

## 2024-09-25 MED ORDER — PROPOFOL 10 MG/ML IV BOLUS
INTRAVENOUS | Status: AC
  Filled 2024-09-25: qty 20

## 2024-09-25 MED ORDER — OXYCODONE HCL 5 MG PO TABS: 5.0000 mg | ORAL_TABLET | ORAL | Status: DC | PRN

## 2024-09-25 MED ORDER — LACTATED RINGERS IV SOLN: INTRAVENOUS | Status: DC

## 2024-09-25 MED ORDER — BUPROPION HCL ER (XL) 150 MG PO TB24
300.0000 mg | ORAL_TABLET | Freq: Every day | ORAL | Status: DC
  Administered 2024-09-26 – 2024-09-27 (×2): 300 mg via ORAL
  Filled 2024-09-25 (×2): qty 2

## 2024-09-25 MED ORDER — ESMOLOL HCL 100 MG/10ML IV SOLN
INTRAVENOUS | Status: AC
  Filled 2024-09-25: qty 10

## 2024-09-25 MED ORDER — CHLORHEXIDINE GLUCONATE 0.12 % MT SOLN
15.0000 mL | OROMUCOSAL | Status: AC
  Administered 2024-09-25: 09:00:00 15 mL via OROMUCOSAL
  Filled 2024-09-25: qty 15

## 2024-09-25 MED ORDER — VANCOMYCIN HCL 1000 MG IV SOLR
INTRAVENOUS | Status: AC
  Filled 2024-09-25: qty 20

## 2024-09-25 MED ORDER — AMISULPRIDE (ANTIEMETIC) 5 MG/2ML IV SOLN: 10.0000 mg | Freq: Once | INTRAVENOUS | Status: DC | PRN

## 2024-09-25 MED ORDER — PHENYLEPHRINE 80 MCG/ML (10ML) SYRINGE FOR IV PUSH (FOR BLOOD PRESSURE SUPPORT)
PREFILLED_SYRINGE | INTRAVENOUS | Status: DC | PRN
  Administered 2024-09-25: 12:00:00 200 ug via INTRAVENOUS
  Administered 2024-09-25: 11:00:00 40 ug via INTRAVENOUS

## 2024-09-25 MED ORDER — OXYCODONE HCL 5 MG PO TABS
10.0000 mg | ORAL_TABLET | ORAL | Status: DC | PRN
  Administered 2024-09-26 – 2024-09-27 (×6): 15 mg via ORAL
  Filled 2024-09-25 (×6): qty 3

## 2024-09-25 MED ORDER — TRAZODONE HCL 50 MG PO TABS
50.0000 mg | ORAL_TABLET | Freq: Every evening | ORAL | Status: DC | PRN
  Administered 2024-09-25: 23:00:00 50 mg via ORAL
  Filled 2024-09-25: qty 1

## 2024-09-25 MED ORDER — ENOXAPARIN SODIUM 40 MG/0.4ML IJ SOSY
40.0000 mg | PREFILLED_SYRINGE | INTRAMUSCULAR | Status: DC
  Administered 2024-09-26: 20:00:00 40 mg via SUBCUTANEOUS
  Filled 2024-09-25: qty 0.4

## 2024-09-25 MED ORDER — OXYCODONE HCL 5 MG PO TABS
ORAL_TABLET | ORAL | Status: AC
  Filled 2024-09-25: qty 1

## 2024-09-25 MED ORDER — METOCLOPRAMIDE HCL 5 MG/ML IJ SOLN: 5.0000 mg | Freq: Three times a day (TID) | INTRAMUSCULAR | Status: DC | PRN

## 2024-09-25 MED ORDER — METOCLOPRAMIDE HCL 5 MG PO TABS: 5.0000 mg | ORAL_TABLET | Freq: Three times a day (TID) | ORAL | Status: DC | PRN

## 2024-09-25 MED ORDER — LOSARTAN POTASSIUM-HCTZ 100-25 MG PO TABS: 1.0000 | ORAL_TABLET | Freq: Every day | ORAL | Status: DC

## 2024-09-25 MED ORDER — VANCOMYCIN HCL IN DEXTROSE 1-5 GM/200ML-% IV SOLN
1000.0000 mg | Freq: Two times a day (BID) | INTRAVENOUS | Status: AC
  Administered 2024-09-25: 16:00:00 1000 mg via INTRAVENOUS
  Filled 2024-09-25: qty 200

## 2024-09-25 MED ORDER — FENTANYL CITRATE (PF) 250 MCG/5ML IJ SOLN
INTRAMUSCULAR | Status: DC | PRN
  Administered 2024-09-25 (×2): 100 ug via INTRAVENOUS

## 2024-09-25 MED ORDER — EMPAGLIFLOZIN 10 MG PO TABS
10.0000 mg | ORAL_TABLET | Freq: Every day | ORAL | Status: DC
  Administered 2024-09-26 – 2024-09-27 (×2): 10 mg via ORAL
  Filled 2024-09-25 (×2): qty 1

## 2024-09-25 MED ORDER — POLYETHYLENE GLYCOL 3350 17 G PO PACK
17.0000 g | PACK | Freq: Two times a day (BID) | ORAL | Status: DC
  Administered 2024-09-25 – 2024-09-27 (×3): 17 g via ORAL
  Filled 2024-09-25 (×4): qty 1

## 2024-09-25 MED ORDER — ALBUTEROL SULFATE (2.5 MG/3ML) 0.083% IN NEBU: 2.5000 mg | INHALATION_SOLUTION | Freq: Four times a day (QID) | RESPIRATORY_TRACT | Status: DC | PRN

## 2024-09-25 MED ORDER — SUCCINYLCHOLINE CHLORIDE 200 MG/10ML IV SOSY
PREFILLED_SYRINGE | INTRAVENOUS | Status: DC | PRN
  Administered 2024-09-25: 11:00:00 100 mg via INTRAVENOUS

## 2024-09-25 MED ORDER — DULOXETINE HCL 60 MG PO CPEP
60.0000 mg | ORAL_CAPSULE | Freq: Every day | ORAL | Status: DC
  Administered 2024-09-26 – 2024-09-27 (×2): 60 mg via ORAL
  Filled 2024-09-25 (×2): qty 1

## 2024-09-25 MED ORDER — FLUTICASONE FUROATE-VILANTEROL 200-25 MCG/ACT IN AEPB
1.0000 | INHALATION_SPRAY | Freq: Every day | RESPIRATORY_TRACT | Status: DC
  Administered 2024-09-26 – 2024-09-27 (×2): 1 via RESPIRATORY_TRACT
  Filled 2024-09-25: qty 28

## 2024-09-25 MED ORDER — PHENYLEPHRINE 80 MCG/ML (10ML) SYRINGE FOR IV PUSH (FOR BLOOD PRESSURE SUPPORT)
PREFILLED_SYRINGE | INTRAVENOUS | Status: AC
  Filled 2024-09-25: qty 10

## 2024-09-25 MED ORDER — DEXAMETHASONE SOD PHOSPHATE PF 10 MG/ML IJ SOLN
INTRAMUSCULAR | Status: DC | PRN
  Administered 2024-09-25: 11:00:00 4 mg via INTRAVENOUS

## 2024-09-25 MED ORDER — INSULIN ASPART 100 UNIT/ML IJ SOLN
INTRAMUSCULAR | Status: AC
  Administered 2024-09-25: 11:00:00 4 [IU] via SUBCUTANEOUS
  Filled 2024-09-25: qty 4

## 2024-09-25 MED ORDER — NAPROXEN 250 MG PO TABS: 375.0000 mg | ORAL_TABLET | Freq: Three times a day (TID) | ORAL | Status: DC | PRN

## 2024-09-25 MED ORDER — AMOXICILLIN-POT CLAVULANATE 875-125 MG PO TABS: 1.0000 | ORAL_TABLET | Freq: Two times a day (BID) | ORAL | Status: DC

## 2024-09-25 MED ORDER — ONDANSETRON HCL 4 MG/2ML IJ SOLN: 4.0000 mg | Freq: Four times a day (QID) | INTRAMUSCULAR | Status: DC | PRN

## 2024-09-25 MED ORDER — VANCOMYCIN HCL 1000 MG IV SOLR
INTRAVENOUS | Status: DC | PRN
  Administered 2024-09-25: 11:00:00 1000 mg

## 2024-09-25 MED ORDER — OXYCODONE HCL 5 MG PO TABS
5.0000 mg | ORAL_TABLET | Freq: Once | ORAL | Status: AC | PRN
  Administered 2024-09-25: 14:00:00 5 mg via ORAL

## 2024-09-25 MED ORDER — DEXMEDETOMIDINE HCL IN NACL 80 MCG/20ML IV SOLN
INTRAVENOUS | Status: DC | PRN
  Administered 2024-09-25: 12:00:00 10 ug via INTRAVENOUS

## 2024-09-25 MED ORDER — PANTOPRAZOLE SODIUM 40 MG PO TBEC
40.0000 mg | DELAYED_RELEASE_TABLET | Freq: Every day | ORAL | Status: DC
  Administered 2024-09-26 – 2024-09-27 (×2): 40 mg via ORAL
  Filled 2024-09-25 (×2): qty 1

## 2024-09-25 MED ORDER — CEFAZOLIN SODIUM-DEXTROSE 2-4 GM/100ML-% IV SOLN
2.0000 g | INTRAVENOUS | Status: AC
  Administered 2024-09-25: 11:00:00 2 g via INTRAVENOUS
  Filled 2024-09-25: qty 100

## 2024-09-25 MED ORDER — HYDROCHLOROTHIAZIDE 25 MG PO TABS
25.0000 mg | ORAL_TABLET | Freq: Every day | ORAL | Status: DC
  Administered 2024-09-25 – 2024-09-27 (×3): 25 mg via ORAL
  Filled 2024-09-25 (×3): qty 1

## 2024-09-25 MED ADMIN — PROPOFOL 200 MG/20ML IV EMUL: 70 mg | INTRAVENOUS | @ 12:00:00 | NDC 00069020910

## 2024-09-25 MED ADMIN — PROPOFOL 200 MG/20ML IV EMUL: 130 mg | INTRAVENOUS | @ 11:00:00 | NDC 00069020910

## 2024-09-25 MED ADMIN — Ondansetron HCl Inj 4 MG/2ML (2 MG/ML): 4 mg | INTRAVENOUS | @ 11:00:00 | NDC 60505613005

## 2024-09-25 MED FILL — Midazolam HCl Inj 2 MG/2ML (Base Equivalent): INTRAMUSCULAR | Qty: 2 | Status: AC

## 2024-09-25 MED FILL — Hydromorphone HCl Inj 1 MG/ML: INTRAMUSCULAR | Qty: 1 | Status: AC

## 2024-09-25 MED FILL — Fentanyl Citrate Preservative Free (PF) Inj 100 MCG/2ML: INTRAMUSCULAR | Qty: 2 | Status: AC

## 2024-09-25 SURGICAL SUPPLY — 40 items
BAG COUNTER SPONGE SURGICOUNT (BAG) IMPLANT
BLADE SURG 21 STRL SS (BLADE) ×1 IMPLANT
BNDG COHESIVE 4X5 TAN STRL LF (GAUZE/BANDAGES/DRESSINGS) IMPLANT
BNDG COHESIVE 6X5 TAN NS LF (GAUZE/BANDAGES/DRESSINGS) IMPLANT
BNDG COHESIVE 6X5 TAN ST LF (GAUZE/BANDAGES/DRESSINGS) IMPLANT
BNDG GAUZE DERMACEA FLUFF 4 (GAUZE/BANDAGES/DRESSINGS) IMPLANT
CANISTER WOUNDNEG PRESSURE 500 (CANNISTER) IMPLANT
CLEANSER WND VASHE 34 (WOUND CARE) IMPLANT
CLEANSER WND VASHE INSTL 34OZ (WOUND CARE) IMPLANT
COVER SURGICAL LIGHT HANDLE (MISCELLANEOUS) ×2 IMPLANT
DRAPE DERMATAC (DRAPES) IMPLANT
DRAPE U-SHAPE 47X51 STRL (DRAPES) ×1 IMPLANT
DRESSING PREVENA PLUS CUSTOM (GAUZE/BANDAGES/DRESSINGS) IMPLANT
DRESSING VERAFLO CLEANS CC MED (GAUZE/BANDAGES/DRESSINGS) IMPLANT
DRSG ADAPTIC 3X8 NADH LF (GAUZE/BANDAGES/DRESSINGS) ×1 IMPLANT
DRSG VAC PEEL AND PLACE LRG (GAUZE/BANDAGES/DRESSINGS) IMPLANT
DURAPREP 26ML APPLICATOR (WOUND CARE) ×1 IMPLANT
ELECTRODE REM PT RTRN 9FT ADLT (ELECTROSURGICAL) IMPLANT
GAUZE PAD ABD 8X10 STRL (GAUZE/BANDAGES/DRESSINGS) IMPLANT
GAUZE SPONGE 4X4 12PLY STRL (GAUZE/BANDAGES/DRESSINGS) IMPLANT
GLOVE BIOGEL PI IND STRL 9 (GLOVE) ×1 IMPLANT
GLOVE BIOGEL PI ORTHO SZ9 (GLOVE) ×1 IMPLANT
GOWN STRL REUS W/ TWL XL LVL3 (GOWN DISPOSABLE) ×2 IMPLANT
GRAFT SKIN WND MICRO 38 (Tissue) IMPLANT
KIT BASIN OR (CUSTOM PROCEDURE TRAY) ×1 IMPLANT
KIT DRSG PREVENA PLUS 7DAY 125 (MISCELLANEOUS) IMPLANT
KIT TURNOVER KIT B (KITS) ×1 IMPLANT
MANIFOLD NEPTUNE II (INSTRUMENTS) ×1 IMPLANT
PACK ORTHO EXTREMITY (CUSTOM PROCEDURE TRAY) ×1 IMPLANT
PAD ARMBOARD POSITIONER FOAM (MISCELLANEOUS) ×2 IMPLANT
PAD NEG PRESSURE SENSATRAC (MISCELLANEOUS) IMPLANT
SET HNDPC FAN SPRY TIP SCT (DISPOSABLE) IMPLANT
SOLN 0.9% NACL POUR BTL 1000ML (IV SOLUTION) ×1 IMPLANT
STOCKINETTE IMPERVIOUS 9X36 MD (GAUZE/BANDAGES/DRESSINGS) IMPLANT
SUT ETHILON 2 0 PSLX (SUTURE) ×1 IMPLANT
SWAB COLLECTION DEVICE MRSA (MISCELLANEOUS) ×1 IMPLANT
SWAB CULTURE ESWAB REG 1ML (MISCELLANEOUS) IMPLANT
TOWEL GREEN STERILE (TOWEL DISPOSABLE) ×1 IMPLANT
TUBE CONNECTING 12X1/4 (SUCTIONS) ×1 IMPLANT
YANKAUER SUCT BULB TIP NO VENT (SUCTIONS) ×1 IMPLANT

## 2024-09-25 NOTE — Anesthesia Preprocedure Evaluation (Addendum)
 Anesthesia Evaluation  Patient identified by MRN, date of birth, ID band Patient awake    Reviewed: Allergy & Precautions, NPO status , Patient's Chart, lab work & pertinent test results  History of Anesthesia Complications Negative for: history of anesthetic complications  Airway Mallampati: II  TM Distance: >3 FB Neck ROM: Full    Dental  (+) Teeth Intact, Dental Advisory Given, Edentulous Upper, Missing   Pulmonary asthma , COPD, former smoker   breath sounds clear to auscultation       Cardiovascular hypertension,  Rhythm:Regular Rate:Normal     Neuro/Psych  Headaches, neg Seizures    GI/Hepatic ,GERD  ,,Esophageal Dysphagia w/ vomiting   Endo/Other  diabetes, Type 2, Insulin  Dependent Hyperthyroidism   Renal/GU      Musculoskeletal  (+) Arthritis , Osteoarthritis,  Osteoporosis  S/p ACDF   Abdominal   Peds  Hematology  (+) Blood dyscrasia, anemia Hgb 10.1, Plts 174K (09/25/24)   Anesthesia Other Findings   Reproductive/Obstetrics                              Anesthesia Physical Anesthesia Plan  ASA: 3  Anesthesia Plan: General   Post-op Pain Management:    Induction: Intravenous  PONV Risk Score and Plan: 2 and Treatment may vary due to age or medical condition  Airway Management Planned: Oral ETT and Video Laryngoscope Planned  Additional Equipment: None  Intra-op Plan:   Post-operative Plan: Extubation in OR  Informed Consent:      Dental advisory given  Plan Discussed with: CRNA  Anesthesia Plan Comments:         Anesthesia Quick Evaluation

## 2024-09-25 NOTE — Anesthesia Procedure Notes (Signed)
 Procedure Name: Intubation Date/Time: 09/25/2024 11:17 AM  Performed by: Hedy Jarred, CRNAPre-anesthesia Checklist: Patient identified, Emergency Drugs available, Suction available and Patient being monitored Patient Re-evaluated:Patient Re-evaluated prior to induction Oxygen Delivery Method: Circle System Utilized Preoxygenation: Pre-oxygenation with 100% oxygen Induction Type: IV induction and Rapid sequence Laryngoscope Size: Glidescope and 3 Grade View: Grade I Tube type: Oral Tube size: 7.0 mm Number of attempts: 1 Airway Equipment and Method: Stylet and Oral airway Placement Confirmation: ETT inserted through vocal cords under direct vision, positive ETCO2 and breath sounds checked- equal and bilateral Secured at: 21 cm Tube secured with: Tape Dental Injury: Teeth and Oropharynx as per pre-operative assessment

## 2024-09-25 NOTE — Progress Notes (Signed)
 CBG 187 on arrival to short stay. Patient stated she took 20 units of insulin  this morning prior to leaving the house at 0700. Anesthesia made aware. Instructed to check CBG in thirty minutes. Orders received and carried out.

## 2024-09-25 NOTE — Anesthesia Postprocedure Evaluation (Signed)
 Anesthesia Post Note  Patient: Kara Kelley  Procedure(s) Performed: INCISION AND DRAINAGE OF LEFT ANKLE (Left: Ankle)     Patient location during evaluation: PACU Anesthesia Type: General Level of consciousness: awake Pain management: pain level controlled Vital Signs Assessment: post-procedure vital signs reviewed and stable Respiratory status: spontaneous breathing and nonlabored ventilation Cardiovascular status: blood pressure returned to baseline Postop Assessment: no apparent nausea or vomiting and adequate PO intake Anesthetic complications: no   No notable events documented.             Lauraine DASEN Colhoun

## 2024-09-25 NOTE — Evaluation (Signed)
 Physical Therapy Evaluation Patient Details Name: Kara Kelley MRN: [id number] DOB: 02-Apr-1956 Today's Date: 09/25/2024  History of Present Illness  68 y.o. F presented to [facility] 09/25/24 for   leg ankle abscess with wound dehiscence  s/p ORIF L displaced bimallelar fracture of L ankle (07/02/24). Pt s/p excisional debridement of skin, soft tissue, muscle, and fascia, partial excision of the distal fibula, and wound vac application 12/17. PMHx: T2DM, hypothyroidism, cervical myelopathy, C4-C7 ACDF in 10/2021, L4-S1 decompression and fusion, anxiety, OA, COPD, depression, HTN. Of note, pt had another infection requiring OR for washout 08/09/24.   Clinical Impression  Pt admitted with above diagnosis. PTA, pt was modI for functional mobility using a RW and independent with ADLs. She lives alone in a one level apartment with a ramped entrance. Pt currently with functional limitations due to the deficits listed below (see PT Problem List). She performed bed mobility with supervision and required CGA for transfers and gait using RW. Pt is currently limited by LLE pain, weight bearing status (LLE WBAT in cam boot), and decreased activity tolerance. Pt will benefit from acute skilled PT to increase her independence and safety with mobility to allow discharge. Recommend HHPT to increase strength, improve balance, decrease fall risk, and optimize safety within the home environment.      If plan is discharge home, recommend the following: A little help with walking and/or transfers;A little help with bathing/dressing/bathroom;Assistance with cooking/housework;Assist for transportation;Help with stairs or ramp for entrance   Can travel by private vehicle        Equipment Recommendations None recommended by PT  Recommendations for Other Services       Functional Status Assessment Patient has had a recent decline in their functional status and demonstrates the ability to make significant improvements in  function in a reasonable and predictable amount of time.     Precautions / Restrictions Precautions Precautions: Fall Recall of Precautions/Restrictions: Intact Required Braces or Orthoses: Other Brace Other Brace: LLE Cam Boot Restrictions Weight Bearing Restrictions Per Provider Order: Yes LLE Weight Bearing Per Provider Order: Weight bearing as tolerated (in cam boot)      Mobility  Bed Mobility Overal bed mobility: Needs Assistance Bed Mobility: Supine to Sit     Supine to sit: Supervision, HOB elevated     General bed mobility comments: Pt sat up on R side of bed with increased time. Assist to manage lines/leads.    Transfers Overall transfer level: Needs assistance Equipment used: Rolling walker (2 wheels) Transfers: Sit to/from Stand, Bed to chair/wheelchair/BSC Sit to Stand: Contact guard assist   Step pivot transfers: Contact guard assist       General transfer comment: Pt stood from lowest bed height. She demonstrated proper hand placement using RW. Powered up without physical assist. CGA for safety. Transferred to recliner chair. Good eccentric control.    Ambulation/Gait Ambulation/Gait assistance: Contact guard assist Gait Distance (Feet): 15 Feet Assistive device: Rolling walker (2 wheels) Gait Pattern/deviations: Step-through pattern, Decreased stance time - left, Decreased weight shift to left, Antalgic Gait velocity: decreased     General Gait Details: Pt ambulated with a mild antalgic gait pattern d/t LLE pain. She self-limited weight acceptance on LLE. Pt navigated room well taking slow short steps. She maintained upright posture and good proximity to RW. Distance limited d/t pain.  Stairs            Wheelchair Mobility     Tilt Bed    Modified Rankin (Stroke Patients Only)  Balance Overall balance assessment: Needs assistance Sitting-balance support: No upper extremity supported, Feet supported Sitting balance-Leahy Scale:  Good     Standing balance support: Bilateral upper extremity supported, During functional activity, Reliant on assistive device for balance Standing balance-Leahy Scale: Poor Standing balance comment: Pt dependent on RW                             Pertinent Vitals/Pain Pain Assessment Pain Assessment: 0-10 Pain Score: 8  Pain Location: L ankle/foot Pain Descriptors / Indicators: Grimacing, Discomfort, Aching Pain Intervention(s): Monitored during session, Limited activity within patient's tolerance, Repositioned    Home Living Family/patient expects to be discharged to:: Private residence Living Arrangements: Alone Available Help at Discharge: Family;Available PRN/intermittently Type of Home: Apartment Home Access: Ramped entrance       Home Layout: One level Home Equipment: Wheelchair - Nurse, Children's (2 wheels);Rollator (4 wheels) Additional Comments: Pt reports she has been recieving HHPT 1x/wk.    Prior Function Prior Level of Function : Independent/Modified Independent             Mobility Comments: Indep, no AD at baseline. Since fall and injury of L ankle in 06/2024 pt has been modI. She was primarily using manual w/c, but advanced to ambulating using RW in the past month. Denies recent falls. ADLs Comments: Indep with ADLs and most IADLs. She reports her daughter will cook for her and family will aid in transporting her where she needs to go.     Extremity/Trunk Assessment   Upper Extremity Assessment Upper Extremity Assessment: Overall WFL for tasks assessed    Lower Extremity Assessment Lower Extremity Assessment: LLE deficits/detail LLE Deficits / Details: Pt with decrease ankle AROM. Hip and Knee AROM WFL. Pt with decreased strength, grossly 3/5. LLE: Unable to fully assess due to pain LLE Sensation: WNL LLE Coordination: WNL    Cervical / Trunk Assessment Cervical / Trunk Assessment: Other exceptions Cervical / Trunk  Exceptions: Increased Body Habitus  Communication   Communication Communication: No apparent difficulties    Cognition Arousal: Alert Behavior During Therapy: WFL for tasks assessed/performed   PT - Cognitive impairments: No apparent impairments                       PT - Cognition Comments: Pt A,Ox4 Following commands: Intact       Cueing Cueing Techniques: Verbal cues     General Comments General comments (skin integrity, edema, etc.): VSS on RA    Exercises     Assessment/Plan    PT Assessment Patient needs continued PT services  PT Problem List Decreased strength;Decreased activity tolerance;Decreased balance;Decreased mobility;Decreased knowledge of precautions;Decreased skin integrity       PT Treatment Interventions DME instruction;Gait training;Functional mobility training;Therapeutic activities;Therapeutic exercise;Balance training;Patient/family education    PT Goals (Current goals can be found in the Care Plan section)  Acute Rehab PT Goals Patient Stated Goal: Return Home PT Goal Formulation: With patient Time For Goal Achievement: 10/09/24 Potential to Achieve Goals: Good    Frequency Min 2X/week     Co-evaluation               AM-PAC PT 6 Clicks Mobility  Outcome Measure Help needed turning from your back to your side while in a flat bed without using bedrails?: A Little Help needed moving from lying on your back to sitting on the side of a flat bed without using bedrails?: A  Little Help needed moving to and from a bed to a chair (including a wheelchair)?: A Little Help needed standing up from a chair using your arms (e.g., wheelchair or bedside chair)?: A Little Help needed to walk in hospital room?: A Little Help needed climbing 3-5 steps with a railing? : A Lot 6 Click Score: 17    End of Session Equipment Utilized During Treatment: Gait belt Activity Tolerance: Patient tolerated treatment well;Patient limited by  pain Patient left: in chair;with call bell/phone within reach;with chair alarm set Nurse Communication: Mobility status PT Visit Diagnosis: Pain;Difficulty in walking, not elsewhere classified (R26.2);Other abnormalities of gait and mobility (R26.89);Unsteadiness on feet (R26.81) Pain - Right/Left: Left Pain - part of body: Leg;Ankle and joints of foot    Time: 8375-8358 PT Time Calculation (min) (ACUTE ONLY): 17 min   Charges:   PT Evaluation $PT Eval Moderate Complexity: 1 Mod   PT General Charges $$ ACUTE PT VISIT: 1 Visit         Randall SAUNDERS, PT, DPT Acute Rehabilitation Services Office: [phone] Secure Chat Preferred  Delon CHRISTELLA Callander 09/25/2024, 5:23 PM

## 2024-09-25 NOTE — Transfer of Care (Signed)
 Immediate Anesthesia Transfer of Care Note  Patient: Kara Kelley  Procedure(s) Performed: INCISION AND DRAINAGE OF LEFT ANKLE (Left: Ankle)  Patient Location: PACU  Anesthesia Type:General  Level of Consciousness: awake, alert , and oriented  Airway & Oxygen Therapy: Patient Spontanous Breathing  Post-op Assessment: Report given to RN and Post -op Vital signs reviewed and stable  Post vital signs: Reviewed and stable  Last Vitals:  Vitals Value Taken Time  BP 135/75 09/25/24 12:15  Temp    Pulse 106 09/25/24 12:26  Resp 19 09/25/24 12:26  SpO2 94 % 09/25/24 12:26  Vitals shown include unfiled device data.  Last Pain:  Vitals:   09/25/24 1213  TempSrc:   PainSc: 10-Worst pain ever      Patients Stated Pain Goal: 5 (09/25/24 0854)  Complications: No notable events documented.

## 2024-09-25 NOTE — Op Note (Signed)
 09/25/2024  11:56 AM  PATIENT:  Kara Kelley    PRE-OPERATIVE DIAGNOSIS:  Left Ankle abscess with wound dehiscence  POST-OPERATIVE DIAGNOSIS:  Same  PROCEDURE: Excisional debridement skin soft tissue muscle and fascia. Partial excision of the distal fibula. Tissue sent for cultures. Local tissue transfer for wound closure 10 x 4 cm. Application Kerecis micro graft 38 cm. Application of vancomycin  1 g and tobramycin  powder 1.2 g. Application of peel in place wound VAC sponge.  SURGEON:  Jerona GAILS Sage, MD  PHYSICIAN ASSISTANT:None ANESTHESIA:   General  PREOPERATIVE INDICATIONS:  Kara Kelley is a  68 y.o. female with a diagnosis of Left Ankle Infection who failed conservative measures and elected for surgical management.    The risks benefits and alternatives were discussed with the patient preoperatively including but not limited to the risks of infection, bleeding, nerve injury, cardiopulmonary complications, the need for revision surgery, among others, and the patient was willing to proceed.  OPERATIVE IMPLANTS:   Implant Name Type Inv. Item Serial No. Manufacturer Lot No. LRB No. Used Action  GRAFT SKIN WND MICRO 38 - [id number] Tissue GRAFT SKIN WND MICRO 38  [employer] [id number] Left 1 Implanted    @ENCIMAGES @  OPERATIVE FINDINGS: Soft tissue and bone was sent for cultures.  OPERATIVE PROCEDURE: Patient was brought the operating room and underwent a general anesthetic.  After adequate levels anesthesia were obtained patient's left lower extremity was prepped using DuraPrep draped into a sterile field a timeout was called.  An elliptical incision was made around the previous incision in the area of wound dehiscence.  This left the wound that was 10 x 4 cm.  A 21 blade knife and rondure were used to excise skin soft tissue muscle and fascia.  A rondure was used to further debride and excise partial distal fibula.  There is no signs of infection from the previous  screw holes or anchor holes in the fibula.  Electrocautery was used for hemostasis.  The wound was irrigated with Vashe.  Soft tissue and bone was sent for cultures.  The tissue margins were undermined to allow for local tissue transfer.  The wound bed was filled with a mixture of 38 cm of Kerecis micro graft, 1 g vancomycin  powder, 1.2 g tobramycin  powder.  Local tissue transfer was used to close the wound 10 x 4 cm with 2-0 nylon.  A Prevena peel in place wound VAC sponge was applied this had a good suction fit patient was extubated taken the PACU in stable condition.   DISCHARGE PLANNING:  Antibiotic duration: 24 hours IV antibiotics  Weightbearing: Weightbearing as tolerated in a cam boot  Pain medication: Opioid pathway  Dressing care/ Wound VAC: Wound VAC discharge with the Prevena plus pump  Ambulatory devices: Walker  Discharge to: Anticipate discharge to home  Follow-up: In the office 1 week post operative.

## 2024-09-25 NOTE — Progress Notes (Signed)
 Orthopedic Tech Progress Note Patient Details:  Kara Kelley 04/02/1956 [id number]  Patient stated she had her own CAM WALKER BOOT and wanted that boot instead. PACU RN is aware   Patient ID: Kara Kelley, female   DOB: 02-25-1956, 68 y.o.   MRN: [id number]  Kara Kelley Pac 09/25/2024, 1:45 PM

## 2024-09-25 NOTE — Interval H&P Note (Signed)
 History and Physical Interval Note:  09/25/2024 9:34 AM  Kara Kelley  has presented today for surgery, with the diagnosis of Left Ankle Infection.  The various methods of treatment have been discussed with the patient and family. After consideration of risks, benefits and other options for treatment, the patient has consented to  Procedures with comments: INCISION AND DRAINAGE OF DEEP ABSCESS, ANKLE (Left) - IRRIGATION AND DEBRIDEMENT LEFT ANKLE as a surgical intervention.  The patient's history has been reviewed, patient examined, no change in status, stable for surgery.  I have reviewed the patient's chart and labs.  Questions were answered to the patient's satisfaction.     Quentyn Kolbeck V Suzana Sohail

## 2024-09-26 ENCOUNTER — Other Ambulatory Visit

## 2024-09-26 ENCOUNTER — Encounter (HOSPITAL_COMMUNITY): Payer: Self-pay | Admitting: Orthopedic Surgery

## 2024-09-26 DIAGNOSIS — T81329A Deep disruption or dehiscence of operation wound, unspecified, initial encounter: Secondary | ICD-10-CM | POA: Diagnosis not present

## 2024-09-26 LAB — GLUCOSE, CAPILLARY
Glucose-Capillary: 167 mg/dL — ABNORMAL HIGH (ref 70–99)
Glucose-Capillary: 155 mg/dL — ABNORMAL HIGH (ref 70–99)
Glucose-Capillary: 107 mg/dL — ABNORMAL HIGH (ref 70–99)
Glucose-Capillary: 163 mg/dL — ABNORMAL HIGH (ref 70–99)

## 2024-09-26 NOTE — Plan of Care (Signed)
°  Problem: Education: Goal: Knowledge of General Education information will improve Description: Including pain rating scale, medication(s)/side effects and non-pharmacologic comfort measures Outcome: Progressing   Problem: Clinical Measurements: Goal: Cardiovascular complication will be avoided Outcome: Progressing   Problem: Nutrition: Goal: Adequate nutrition will be maintained Outcome: Progressing   Problem: Elimination: Goal: Will not experience complications related to urinary retention Outcome: Progressing   Problem: Pain Managment: Goal: General experience of comfort will improve and/or be controlled Outcome: Progressing

## 2024-09-26 NOTE — Progress Notes (Signed)
 Mobility Specialist Progress Note:    09/26/24 1145  Mobility  Activity Pivoted/transferred to/from [facility]  Level of Assistance Contact guard assist, steadying assist  Assistive Device Front wheel walker  Distance Ambulated (ft) 6 ft  LLE Weight Bearing Per Provider Order WBAT  Activity Response Tolerated well  Mobility Referral Yes  Mobility visit 1 Mobility  Mobility Specialist Start Time (ACUTE ONLY) 1135  Mobility Specialist Stop Time (ACUTE ONLY) 1145  Mobility Specialist Time Calculation (min) (ACUTE ONLY) 10 min   Received pt's bed alarm sounding and pt requesting to use BSC. Pt required MinG for safety. No c/o. Pt refused further mobility; reason unspecified. Pt left in bed with alarm on. Personal belongings and call light within reach. All needs met.  Lavanda Pollack Mobility Specialist  Please contact via [employer] or  Rehab Office [phone]

## 2024-09-26 NOTE — Progress Notes (Signed)
 Patient ID: Kara Kelley, female   DOB: 13-Mar-1956, 68 y.o.   MRN: [id number] Patient is postoperative day 1 debridement left ankle.  There is vancomycin  and tobramycin  powder within the wound as well as Kerecis tissue graft.  The wound VAC is functioning well.  Anticipate discharge to home on Friday.  Tissue cultures are showing no organisms.

## 2024-09-26 NOTE — Care Management Obs Status (Signed)
 MEDICARE OBSERVATION STATUS NOTIFICATION   Patient Details  Name: Kara Kelley MRN: [id number] Date of Birth: 07-22-1956   Medicare Observation Status Notification Given:  Yes    [name] 09/26/2024, 1:08 PM

## 2024-09-27 DIAGNOSIS — T81329A Deep disruption or dehiscence of operation wound, unspecified, initial encounter: Secondary | ICD-10-CM | POA: Diagnosis not present

## 2024-09-27 LAB — GLUCOSE, CAPILLARY: Glucose-Capillary: 112 mg/dL — ABNORMAL HIGH (ref 70–99)

## 2024-09-27 MED ORDER — HYDROCODONE-ACETAMINOPHEN 10-325 MG PO TABS: 1.0000 | ORAL_TABLET | Freq: Four times a day (QID) | ORAL | 0 refills | Status: AC | PRN

## 2024-09-27 NOTE — Progress Notes (Signed)
 Physical Therapy Treatment Patient Details Name: Kara Kelley MRN: [id number] DOB: 25-Dec-1955 Today's Date: 09/27/2024   History of Present Illness 68 y.o. F presented to [facility] 09/25/24 for   leg ankle abscess with wound dehiscence  s/p ORIF L displaced bimallelar fracture of L ankle (07/02/24). Pt s/p excisional debridement of skin, soft tissue, muscle, and fascia, partial excision of the distal fibula, and wound vac application 12/17. PMHx: T2DM, hypothyroidism, cervical myelopathy, C4-C7 ACDF in 10/2021, L4-S1 decompression and fusion, anxiety, OA, COPD, depression, HTN. Of note, pt had another infection requiring OR for washout 08/09/24.    PT Comments  Pt admitted with above diagnosis. Pt educated in HEP as pt going home today and states she didn't currently have an HEP at home. Pt given handout and performed exercises in bed.   Pt declined walking and has ramp and states she doesn't need to practice steps. Pt also states her 2 daughters are CNA's and will assist her at home.  Pt currently with functional limitations due to the deficits listed below (see PT Problem List). Pt will benefit from acute skilled PT to increase their independence and safety with mobility to allow discharge.       If plan is discharge home, recommend the following: A little help with walking and/or transfers;A little help with bathing/dressing/bathroom;Assistance with cooking/housework;Assist for transportation;Help with stairs or ramp for entrance   Can travel by private vehicle        Equipment Recommendations  None recommended by PT    Recommendations for Other Services       Precautions / Restrictions Precautions Precautions: Fall Recall of Precautions/Restrictions: Intact Required Braces or Orthoses: Other Brace Other Brace: LLE Cam Boot Restrictions Weight Bearing Restrictions Per Provider Order: Yes LLE Weight Bearing Per Provider Order: Weight bearing as tolerated     Mobility  Bed Mobility                General bed mobility comments: Pt would not sit up as she states she is going home. Asked pt if she performed exercises and she said she didnt.  Educated in exercise program below. Pt able to complete all of exercises. Handout given as well.    Transfers                        Ambulation/Gait                   Stairs             Wheelchair Mobility     Tilt Bed    Modified Rankin (Stroke Patients Only)       Balance                                            Communication Communication Communication: No apparent difficulties  Cognition Arousal: Alert Behavior During Therapy: WFL for tasks assessed/performed   PT - Cognitive impairments: No apparent impairments                       PT - Cognition Comments: Pt A,Ox4 Following commands: Intact      Cueing Cueing Techniques: Verbal cues  Exercises General Exercises - Lower Extremity Ankle Circles/Pumps: AROM, Both, 10 reps, Supine Quad Sets: AROM, Both, 10 reps, Supine Gluteal Sets: AROM, Both, 10 reps, Supine Heel Slides: AAROM,  Both, 10 reps, Supine Hip ABduction/ADduction: AAROM, Both, 10 reps, Supine    General Comments General comments (skin integrity, edema, etc.): VSS on RA      Pertinent Vitals/Pain Pain Assessment Pain Assessment: Faces Faces Pain Scale: Hurts even more Pain Location: L ankle/foot Pain Descriptors / Indicators: Grimacing, Discomfort, Aching Pain Intervention(s): Limited activity within patient's tolerance, Monitored during session, Repositioned    Home Living                          Prior Function            PT Goals (current goals can now be found in the care plan section) Acute Rehab PT Goals Patient Stated Goal: Return Home Progress towards PT goals: Progressing toward goals    Frequency    Min 2X/week      PT Plan      Co-evaluation              AM-PAC PT 6 Clicks  Mobility   Outcome Measure  Help needed turning from your back to your side while in a flat bed without using bedrails?: A Little Help needed moving from lying on your back to sitting on the side of a flat bed without using bedrails?: A Little Help needed moving to and from a bed to a chair (including a wheelchair)?: A Little Help needed standing up from a chair using your arms (e.g., wheelchair or bedside chair)?: A Little Help needed to walk in hospital room?: A Little Help needed climbing 3-5 steps with a railing? : A Lot 6 Click Score: 17    End of Session Equipment Utilized During Treatment: Gait belt Activity Tolerance: Patient tolerated treatment well;Patient limited by pain Patient left: with call bell/phone within reach;in bed;with bed alarm set Nurse Communication: Mobility status PT Visit Diagnosis: Pain;Difficulty in walking, not elsewhere classified (R26.2);Other abnormalities of gait and mobility (R26.89);Unsteadiness on feet (R26.81) Pain - Right/Left: Left Pain - part of body: Leg;Ankle and joints of foot     Time: 9161-9097 PT Time Calculation (min) (ACUTE ONLY): 24 min  Charges:    $Therapeutic Exercise: 23-37 mins PT General Charges $$ ACUTE PT VISIT: 1 Visit                     Jozelynn Danielson M,PT Acute Rehab Services [phone]    Stephane JULIANNA Bevel 09/27/2024, 12:41 PM

## 2024-09-27 NOTE — Progress Notes (Addendum)
 Patient is ready for discharge and niece is taking her home. Discharge instructions given to them both and they both verbalized understanding. All questions answered. Prevena was placed in room and this nurse, along with another nurse, placed it on her successfully and ensured that it was working effectively. CAM boot was on left leg as instructed. No TOC meds, all prescriptions were sent to her local pharmacy  Harrold Clause BSN RN

## 2024-09-27 NOTE — TOC Transition Note (Addendum)
 Transition of Care [facility]) - Discharge Note   Patient Details  Name: Kara Kelley MRN: [id number] Date of Birth: Jan 01, 1956  [facility] CM/SW Contact:  Rosalva Jon Bloch, RN Phone Number: 09/27/2024, 9:49 AM   Clinical Narrative:    Patient will DC to: home Anticipated DC date: 09/27/2024 Family notified: yes Transport by: car  Presented with infected hardware s/p ORIF left displaced bimalleolar fracture of left ankle.       - s/p L ankle debridement, 12/17  Per MD patient ready for DC today . RN, patient, patient's  daughter and  [facility] notified of DC. Pt without DME needs, has needed equipment @ home (W/C,RW and cane). Post hospital f/u noted on AVS. Pt without RX med concerns or transportation issues. Daughter to provide transportation to home.  RNCM will sign off for now as intervention is no longer needed. Please consult us  again if new needs arise.   Final next level of care: Home w Home Health Services Barriers to Discharge: No Barriers Identified   Patient Goals and CMS Choice            Discharge Placement                       Discharge Plan and Services Additional resources added to the After Visit Summary for                                       Social Drivers of Health (SDOH) Interventions SDOH Screenings   Food Insecurity: No Food Insecurity (09/25/2024)  Housing: Low Risk (09/25/2024)  Transportation Needs: No Transportation Needs (09/25/2024)  Utilities: Not At Risk (09/25/2024)  Alcohol Screen: Low Risk (04/20/2023)  Depression (PHQ2-9): Low Risk (06/19/2024)  Financial Resource Strain: Low Risk (04/20/2023)  Physical Activity: Sufficiently Active (04/20/2023)  Social Connections: Moderately Integrated (09/25/2024)  Stress: No Stress Concern Present (04/20/2023)  Tobacco Use: Medium Risk (09/25/2024)  Health Literacy: Adequate Health Literacy (04/20/2023)     Readmission Risk Interventions     No  data to display

## 2024-09-27 NOTE — Plan of Care (Signed)
" °  Problem: Health Behavior/Discharge Planning: Goal: Ability to manage health-related needs will improve Outcome: Progressing   Problem: Clinical Measurements: Goal: Ability to maintain clinical measurements within normal limits will improve Outcome: Progressing Goal: Cardiovascular complication will be avoided Outcome: Progressing   Problem: Activity: Goal: Risk for activity intolerance will decrease Outcome: Progressing   Problem: Nutrition: Goal: Adequate nutrition will be maintained Outcome: Progressing   Problem: Elimination: Goal: Will not experience complications related to bowel motility Outcome: Progressing Goal: Will not experience complications related to urinary retention Outcome: Progressing   "

## 2024-09-27 NOTE — Discharge Instructions (Signed)
 Weight bearing as tolerates cam boot left LE.  Elevate laying down to decrease swelling and pain.  Keep wound vac clean and dry.

## 2024-09-27 NOTE — Discharge Summary (Signed)
 " Physician Discharge Summary  Patient ID: Kara Kelley MRN: [id number] DOB/AGE: [birth year] 66 y.o.  Admit date: 09/25/2024 Discharge date: 09/27/2024  Admission Diagnoses:  Principal Problem:   Abrasion of ankle, left, infected Active Problems:   Abscess of left lower leg   Discharge Diagnoses:  Same  Past Medical History:  Diagnosis Date   Anemia    Anxiety    Arthritis    Asthma    Bacterial vaginosis 05/16/2022   Bunion    Callus    Candida vaginitis 09/16/2022   Cholelithiasis without obstruction 04/11/2022   Chronic pain    Cocaine abuse (HCC)    COPD (chronic obstructive pulmonary disease) (HCC)    Corns and callosities    Degenerative joint disease    Depression    Diabetes mellitus    Endometrial polyp    ETOH abuse    Gall stones    Gallbladder calculus with acute cholecystitis and no obstruction 04/11/2022   GERD (gastroesophageal reflux disease)    Headache    history of Migraines   Hepatitis C    Hep C   History of adenomatous polyp of colon 04/11/2022   History of endometrial ablation 02/06/2013   Hyperlipidemia    Hypertension    Insomnia    Spondylolisthesis of lumbar region    Substance abuse (HCC)    alcoholism   Tuberculosis 1985   Wears dentures    Wears glasses     Surgeries: Procedures: INCISION AND DRAINAGE OF LEFT ANKLE on 09/25/2024   Consultants:   Discharged Condition: Improved  Hospital Course: Kara Kelley is an 68 y.o. female who was admitted 09/25/2024 with a chief complaint of No chief complaint on file. , and found to have a diagnosis of Abrasion of ankle, left, infected.  They were brought to the operating room on 09/25/2024 and underwent the above named procedures.    They were given perioperative antibiotics:  Anti-infectives (From admission, onward)    Start     Dose/Rate Route Frequency Ordered Stop   09/25/24 2200  amoxicillin -clavulanate (AUGMENTIN ) 875-125 MG per tablet 1 tablet  Status:   Discontinued        1 tablet Oral 2 times daily 09/25/24 1444 09/25/24 1451   09/25/24 1600  piperacillin -tazobactam (ZOSYN ) IVPB 3.375 g        3.375 g 12.5 mL/hr over 240 Minutes Intravenous Every 8 hours 09/25/24 1451     09/25/24 1530  vancomycin  (VANCOCIN ) IVPB 1000 mg/200 mL premix        1,000 mg 200 mL/hr over 60 Minutes Intravenous Every 12 hours 09/25/24 1444 09/25/24 1740   09/25/24 1052  vancomycin  (VANCOCIN ) powder  Status:  Discontinued          As needed 09/25/24 1052 09/25/24 1201   09/25/24 1052  tobramycin  (NEBCIN ) powder  Status:  Discontinued          As needed 09/25/24 1053 09/25/24 1201   09/25/24 0830  ceFAZolin  (ANCEF ) IVPB 2g/100 mL premix        2 g 200 mL/hr over 30 Minutes Intravenous On call to O.R. 09/25/24 9182 09/25/24 1121     .  They were given compression stockings, early ambulation, and chemoprophylaxis for DVT prophylaxis.  They benefited maximally from their hospital stay and there were no complications.    Recent vital signs:  Vitals:   09/26/24 2034 09/27/24 0345  BP: (!) 150/83 136/77  Pulse: 81 79  Resp: 18 18  Temp: 98.2 F (  36.8 C) 98.6 F (37 C)  SpO2: 96% 100%    Recent laboratory studies:  Results for orders placed or performed during the hospital encounter of 09/25/24  Glucose, capillary   Collection Time: 09/25/24  8:49 AM  Result Value Ref Range   Glucose-Capillary 187 (H) 70 - 99 mg/dL  CBC WITH DIFFERENTIAL   Collection Time: 09/25/24  9:16 AM  Result Value Ref Range   WBC 7.6 4.0 - 10.5 K/uL   RBC 4.02 3.87 - 5.11 MIL/uL   Hemoglobin 10.1 (L) 12.0 - 15.0 g/dL   HCT 68.6 (L) 63.9 - 53.9 %   MCV 77.9 (L) 80.0 - 100.0 fL   MCH 25.1 (L) 26.0 - 34.0 pg   MCHC 32.3 30.0 - 36.0 g/dL   RDW 83.9 (H) 88.4 - 84.4 %   Platelets 174 150 - 400 K/uL   nRBC 0.0 0.0 - 0.2 %   Neutrophils Relative % 64 %   Neutro Abs 4.9 1.7 - 7.7 K/uL   Lymphocytes Relative 25 %   Lymphs Abs 1.9 0.7 - 4.0 K/uL   Monocytes Relative 6 %    Monocytes Absolute 0.4 0.1 - 1.0 K/uL   Eosinophils Relative 4 %   Eosinophils Absolute 0.3 0.0 - 0.5 K/uL   Basophils Relative 1 %   Basophils Absolute 0.0 0.0 - 0.1 K/uL   Immature Granulocytes 0 %   Abs Immature Granulocytes 0.03 0.00 - 0.07 K/uL  Comprehensive metabolic panel   Collection Time: 09/25/24  9:16 AM  Result Value Ref Range   Sodium 141 135 - 145 mmol/L   Potassium 2.8 (L) 3.5 - 5.1 mmol/L   Chloride 105 98 - 111 mmol/L   CO2 23 22 - 32 mmol/L   Glucose, Bld 185 (H) 70 - 99 mg/dL   BUN 12 8 - 23 mg/dL   Creatinine, Ser 9.12 0.44 - 1.00 mg/dL   Calcium  9.3 8.9 - 10.3 mg/dL   Total Protein 6.3 (L) 6.5 - 8.1 g/dL   Albumin 3.7 3.5 - 5.0 g/dL   AST 15 15 - 41 U/L   ALT 9 0 - 44 U/L   Alkaline Phosphatase 78 38 - 126 U/L   Total Bilirubin 0.3 0.0 - 1.2 mg/dL   GFR, Estimated >39 >39 mL/min   Anion gap 13 5 - 15  Hemoglobin A1c   Collection Time: 09/25/24  9:16 AM  Result Value Ref Range   Hgb A1c MFr Bld 6.7 (H) 4.8 - 5.6 %   Mean Plasma Glucose 145.59 mg/dL  Glucose, capillary   Collection Time: 09/25/24  9:58 AM  Result Value Ref Range   Glucose-Capillary 190 (H) 70 - 99 mg/dL  Aerobic/Anaerobic Culture w Gram Stain (surgical/deep wound)   Collection Time: 09/25/24 11:26 AM   Specimen: Soft Tissue, Other  Result Value Ref Range   Specimen Description TISSUE LEFT ANKLE    Special Requests PT ON ANCEF     Gram Stain NO WBC SEEN NO ORGANISMS SEEN     Culture      NO GROWTH < 24 HOURS Performed at [facility], [address], [place], [place] [address]    Report Status PENDING   Glucose, capillary   Collection Time: 09/25/24 12:14 PM  Result Value Ref Range   Glucose-Capillary 191 (H) 70 - 99 mg/dL  Glucose, capillary   Collection Time: 09/25/24  2:53 PM  Result Value Ref Range   Glucose-Capillary 175 (H) 70 - 99 mg/dL  Glucose, capillary  Collection Time: 09/25/24  9:15 PM  Result Value Ref Range   Glucose-Capillary 208 (H) 70 - 99 mg/dL   Glucose, capillary   Collection Time: 09/26/24  6:16 AM  Result Value Ref Range   Glucose-Capillary 167 (H) 70 - 99 mg/dL  Glucose, capillary   Collection Time: 09/26/24 11:24 AM  Result Value Ref Range   Glucose-Capillary 155 (H) 70 - 99 mg/dL  Glucose, capillary   Collection Time: 09/26/24  4:21 PM  Result Value Ref Range   Glucose-Capillary 107 (H) 70 - 99 mg/dL  Glucose, capillary   Collection Time: 09/26/24  9:08 PM  Result Value Ref Range   Glucose-Capillary 163 (H) 70 - 99 mg/dL  Glucose, capillary   Collection Time: 09/27/24  6:11 AM  Result Value Ref Range   Glucose-Capillary 112 (H) 70 - 99 mg/dL   *Note: Due to a large number of results and/or encounters for the requested time period, some results have not been displayed. A complete set of results can be found in Results Review.    Discharge Medications:   Allergies as of 09/27/2024       Reactions   Mounjaro  [tirzepatide ] Nausea And Vomiting   Ozempic  (0.25 Or 0.5 Mg-dose) [semaglutide (0.25 Or 0.5mg -dos)] Nausea And Vomiting   Worsened dysphagia        Medication List     TAKE these medications    Accu-Chek FastClix Lancets Misc Use as directed to check blood sugars 2 times per day dx: e11.65   Accu-Chek Softclix Lancets lancets Use as instructed to check blood sugars twice daily E11.69   albuterol  108 (90 Base) MCG/ACT inhaler Commonly known as: VENTOLIN  HFA Inhale 1-2 puffs into the lungs every 6 (six) hours as needed for wheezing or shortness of breath.   alendronate  10 MG tablet Commonly known as: FOSAMAX  Take 1 tablet (10 mg total) by mouth daily before breakfast. Take with a full glass of water on an empty stomach.   amoxicillin -clavulanate 875-125 MG tablet Commonly known as: AUGMENTIN  Take 1 tablet by mouth 2 (two) times daily.   atorvastatin  10 MG tablet Commonly known as: LIPITOR Take 1 tablet (10 mg total) by mouth daily.   buPROPion  300 MG 24 hr tablet Commonly known as:  WELLBUTRIN  XL TAKE 1 TABLET(300 MG) BY MOUTH IN THE MORNING AND AT BEDTIME   buPROPion  150 MG 24 hr tablet Commonly known as: WELLBUTRIN  XL Take 150 mg by mouth daily.   cyclobenzaprine  10 MG tablet Commonly known as: FLEXERIL  Take 10 mg by mouth daily.   diclofenac  Sodium 1 % Gel Commonly known as: Voltaren  Apply 4 g topically 4 (four) times daily. What changed:  how much to take when to take this   doxycycline  100 MG tablet Commonly known as: VIBRA -TABS Take 1 tablet (100 mg total) by mouth 2 (two) times daily.   DULoxetine  60 MG capsule Commonly known as: Cymbalta  Take 1 capsule (60 mg total) by mouth daily.   empagliflozin  10 MG Tabs tablet Commonly known as: Jardiance  Take 1 tablet (10 mg total) by mouth daily.   fluticasone -salmeterol 250-50 MCG/ACT Aepb Commonly known as: ADVAIR INHALE 1 PUFF BY MOUTH INTO LUNGS TWICE DAILY IN THE MORNING AND AT BEDTIME What changed: See the new instructions.   FreeStyle Libre 3 Plus Sensor Misc Change sensor every 15 days. Use to check your blood sugar continuously.   HYDROcodone -acetaminophen  10-325 MG tablet Commonly known as: NORCO Take 1 tablet by mouth every 6 (six) hours as needed for moderate  pain (pain score 4-6). What changed:  how much to take when to take this reasons to take this   hydrOXYzine  25 MG tablet Commonly known as: ATARAX  Take 25 mg by mouth in the morning and at bedtime.   insulin  lispro 100 UNIT/ML KwikPen Commonly known as: HumaLOG  KwikPen Inject 5 Units into the skin 3 (three) times daily before meals.   Klor-Con  20 MEQ packet Generic drug: potassium chloride  Take 20 mEq by mouth daily.   lidocaine  5 % Commonly known as: Lidoderm  Place 1 patch onto the skin daily. Remove & Discard patch within 12 hours or as directed by MD   linaclotide  290 MCG Caps capsule Commonly known as: LINZESS  Take 290 mcg by mouth daily as needed (Constipation).   losartan -hydrochlorothiazide  100-25 MG  tablet Commonly known as: Hyzaar Take 1 tablet by mouth daily.   meloxicam  15 MG tablet Commonly known as: MOBIC  Take 1 tablet (15 mg total) by mouth daily as needed for pain (and inflammation). What changed: when to take this   metFORMIN  500 MG 24 hr tablet Commonly known as: GLUCOPHAGE -XR Take 2 tablets (1,000 mg total) by mouth 2 (two) times daily with a meal.   metoCLOPramide  10 MG tablet Commonly known as: REGLAN  Take 1 tablet (10 mg total) by mouth every 8 (eight) hours as needed for nausea.   mineral oil-hydrophilic petrolatum  ointment Apply topically as needed for dry skin.   omeprazole  40 MG capsule Commonly known as: PRILOSEC TAKE 1 CAPSULE(40 MG) BY MOUTH Twice DAILY BEFORE BREAKFAST What changed:  how much to take how to take this when to take this reasons to take this additional instructions   ondansetron  4 MG disintegrating tablet Commonly known as: ZOFRAN -ODT Take 1 tablet (4 mg total) by mouth every 8 (eight) hours as needed for nausea or vomiting.   OneTouch Ultra test strip Generic drug: glucose blood TEST AS DIRECTED UPTO 3 TIMES DAILY   oxyCODONE -acetaminophen  5-325 MG tablet Commonly known as: PERCOCET/ROXICET Take 1 tablet by mouth every 4 (four) hours as needed.   OZEMPIC  (2 MG/DOSE) [place] Inject 2 mg into the skin once a week.   Pen Needles 32G X 4 MM Misc Use new pen needle for each insulin  injection up to 4 times a day.   polyethylene glycol powder 17 GM/SCOOP powder Commonly known as: GLYCOLAX /MIRALAX  Take 17 g by mouth 2 (two) times daily. Until daily soft stools OTC What changed:  when to take this reasons to take this additional instructions   traZODone  50 MG tablet Commonly known as: DESYREL  Take 1 tablet (50 mg total) by mouth at bedtime as needed for sleep.   traZODone  100 MG tablet Commonly known as: DESYREL  Take 200 mg by mouth at bedtime.   Tresiba  FlexTouch 100 UNIT/ML FlexTouch Pen Generic drug: insulin   degludec Inject 20 Units into the skin daily. May increase up to 40 units daily if instructed by your provider. What changed:  when to take this additional instructions   Tylenol  8 Hour 650 MG CR tablet Generic drug: acetaminophen  Take 1,300 mg by mouth daily as needed for pain.               Durable Medical Equipment  (From admission, onward)           Start     Ordered   09/27/24 0809  For home use only DME Vac  Once       Comments: Attach the wound VAC dressing to the Prevena plus portable wound VAC  pump for discharge.  Show patient how to plug this and to keep it charged.   09/27/24 0810            Diagnostic Studies: No results found.  Disposition: Discharge disposition: 01-Home or Self Care       Discharge Instructions     Call MD / Call 911   Complete by: As directed    If you experience chest pain or shortness of breath, CALL 911 and be transported to the hospital emergency room.  If you develope a fever above 101 F, pus (white drainage) or increased drainage or redness at the wound, or calf pain, call your surgeon's office.   Constipation Prevention   Complete by: As directed    Drink plenty of fluids.  Prune juice may be helpful.  You may use a stool softener, such as Colace (over the counter) 100 mg twice a day.  Use MiraLax  (over the counter) for constipation as needed.   Increase activity slowly as tolerated   Complete by: As directed    Post-operative opioid taper instructions:   Complete by: As directed    POST-OPERATIVE OPIOID TAPER INSTRUCTIONS: It is important to wean off of your opioid medication as soon as possible. If you do not need pain medication after your surgery it is ok to stop day one. Opioids include: Codeine, Hydrocodone (Norco, Vicodin), Oxycodone (Percocet, oxycontin ) and hydromorphone  amongst others.  Long term and even short term use of opiods can cause: Increased pain  response Dependence Constipation Depression Respiratory depression And more.  Withdrawal symptoms can include Flu like symptoms Nausea, vomiting And more Techniques to manage these symptoms Hydrate well Eat regular healthy meals Stay active Use relaxation techniques(deep breathing, meditating, yoga) Do Not substitute Alcohol to help with tapering If you have been on opioids for less than two weeks and do not have pain than it is ok to stop all together.  Plan to wean off of opioids This plan should start within one week post op of your joint replacement. Maintain the same interval or time between taking each dose and first decrease the dose.  Cut the total daily intake of opioids by one tablet each day Next start to increase the time between doses. The last dose that should be eliminated is the evening dose.           Contact information for follow-up providers     Harden Jerona GAILS, MD Follow up in 1 week(s).   Specialty: Orthopedic Surgery Contact information: [address] [place]  [place] [address] [phone]         Ricky Pax T, DO Follow up.   Specialty: Family Medicine Contact information: [address], [address] [place] [address] [phone]              Contact information for after-discharge care     Home Medical Care     [facility]) .   Service: Home Health Services Contact information: [address] [place] [address] [place]  [address] [phone]                      Signed: Jerona GAILS Harden 09/27/2024, 8:10 AM  "

## 2024-09-27 NOTE — Progress Notes (Signed)
 Orthopedic Tech Progress Note Patient Details:  Kara Kelley 1955-10-12 [id number]  Patient ID: Kara Kelley, female   DOB: 09-23-1956, 68 y.o.   MRN: [id number] Pt. Brought CAM boot from home. Does not need another. Kara Kelley 09/27/2024, 9:12 AM

## 2024-09-28 ENCOUNTER — Other Ambulatory Visit: Payer: Self-pay | Admitting: Physician Assistant

## 2024-09-28 LAB — AEROBIC/ANAEROBIC CULTURE W GRAM STAIN (SURGICAL/DEEP WOUND)
Culture: NO GROWTH
Gram Stain: NONE SEEN

## 2024-09-30 ENCOUNTER — Ambulatory Visit: Admitting: Orthopedic Surgery

## 2024-09-30 ENCOUNTER — Telehealth: Payer: Self-pay

## 2024-09-30 ENCOUNTER — Encounter: Payer: Self-pay | Admitting: Orthopedic Surgery

## 2024-09-30 DIAGNOSIS — S90512A Abrasion, left ankle, initial encounter: Secondary | ICD-10-CM

## 2024-09-30 DIAGNOSIS — T847XXA Infection and inflammatory reaction due to other internal orthopedic prosthetic devices, implants and grafts, initial encounter: Secondary | ICD-10-CM

## 2024-09-30 DIAGNOSIS — M25572 Pain in left ankle and joints of left foot: Secondary | ICD-10-CM

## 2024-09-30 NOTE — Transitions of Care (Post Inpatient/ED Visit) (Signed)
" ° °  09/30/2024  Name: Kara Kelley MRN: [id number] DOB: [birth year]  Today's TOC FU Call Status: Today's TOC FU Call Status:: Unsuccessful Call (1st Attempt) Unsuccessful Call (1st Attempt) Date: 09/30/24  Attempted to reach the patient regarding the most recent Inpatient/ED visit.  Follow Up Plan: Additional outreach attempts will be made to reach the patient to complete the Transitions of Care (Post Inpatient/ED visit) call.   Signature Julian Lemmings, LPN [facility] Nurse Health Advisor Direct Dial  [phone]  "

## 2024-09-30 NOTE — Progress Notes (Signed)
 "  Office Visit Note   Patient: Kara Kelley           Date of Birth: [birth year]           MRN: [id number] Visit Date: 09/30/2024              Requested by: Ricky Alfrieda DASEN, DO [address] [place],  [place] [address] PCP: Ricky Alfrieda DASEN, DO  Chief Complaint  Patient presents with   Left Ankle - Routine Post Op    09/25/2024 left ankle I&D      HPI: Discussed the use of AI scribe software for clinical note transcription with the patient, who gave verbal consent to proceed.  History of Present Illness Kara Kelley is a 68 year old female, five days status post left ankle irrigation and debridement, who presents with increased wound drainage and swelling.  Five days ago, she underwent surgical irrigation and debridement of the left ankle for wound infection. Postoperatively, a wound vac was in place until it became detached from the dressing the day prior to presentation. Following detachment, she noted a significant increase in clear serosanguineous drainage from the wound and has been covering the area with clean gauze as instructed.  She reports persistent swelling and pain of the left ankle and foot, which worsen with inadequate elevation. Swelling is more pronounced when the limb is not elevated at heart level, and pain intensifies with weight bearing and when wearing the boot. She finds the boot uncomfortable, particularly when rising from a seated position, but is able to move her toes and ankle without difficulty. She has minimized ambulation and weight bearing.  She denies new systemic symptoms such as fever or malaise and otherwise feels well.     Assessment & Plan: Visit Diagnoses:  1. Hardware complicating wound infection, initial encounter   2. Abrasion of left ankle without infection, initial encounter   3. Pain in left ankle and joints of left foot     Plan: Assessment and Plan Assessment & Plan Postoperative wound care of left ankle Five days  post irrigation and debridement. Wound vac detached, increased serosanguineous drainage. Well-approximated wound edges, maceration, swelling, no new infection. Cultures negative. Wound healing appropriate. Swelling and pain due to dependent positioning. Weight bearing risks wound dehiscence. - Instructed to cleanse wound with soap and water daily. - Advised daily application and change of dry gauze dressing. - Applied smaller ACE wrap to left ankle. - Advised strict elevation of left leg at heart level. - Recommended minimizing weight bearing, remain non-weight bearing as much as possible. - Instructed to continue wearing boot when ambulating, avoid ambulation to prevent wound dehiscence. - Educated that pain likely secondary to swelling, should improve with elevation. - Scheduled follow-up in two weeks. - Instructed to contact office for any concerning changes.      Follow-Up Instructions: Return in about 2 weeks (around 10/14/2024).   Ortho Exam  Patient is alert, oriented, no adenopathy, well-dressed, normal affect, normal respiratory effort. Physical Exam EXTREMITIES: Clear serosanguineous drainage from left ankle wound. Wound edges well approximated with maceration from drainage. Good pulse in left ankle.  No cellulitis.   Interoperative cultures are negative.   Imaging: No results found. No images are attached to the encounter.  Labs: Lab Results  Component Value Date   HGBA1C 6.7 (H) 09/25/2024   HGBA1C 8.6 (A) 06/19/2024   HGBA1C 8.1 (A) 02/27/2024   ESRSEDRATE 22 03/12/2020   ESRSEDRATE 71 (H) 07/18/2017  CRP 20.1 (H) 07/17/2017   REPTSTATUS 09/28/2024 FINAL 09/25/2024   GRAMSTAIN NO WBC SEEN NO ORGANISMS SEEN  09/25/2024   CULT  09/25/2024    RARE FINEGOLDIA MAGNA NO GROWTH AEROBICALLY Performed at [facility], [address], [place], [place] [address]    [facility] STAPHYLOCOCCUS LUGDUNENSIS 08/09/2024     Lab Results  Component Value Date   ALBUMIN  3.7 09/25/2024   ALBUMIN 3.4 (L) 08/09/2024   ALBUMIN 4.3 06/21/2024    Lab Results  Component Value Date   MG 1.6 12/21/2023   MG 1.6 (L) 08/25/2022   MG 1.7 09/24/2019   Lab Results  Component Value Date   VD25OH 53 05/15/2009    No results found for: PREALBUMIN    Latest Ref Rng & Units 09/25/2024    9:16 AM 08/09/2024   10:42 AM 06/21/2024    3:11 PM  CBC EXTENDED  WBC 4.0 - 10.5 K/uL 7.6  5.4    RBC 3.87 - 5.11 MIL/uL 4.02  4.19    Hemoglobin 12.0 - 15.0 g/dL 89.8  89.5  86.0   HCT 36.0 - 46.0 % 31.3  32.8  41.0   Platelets 150 - 400 K/uL 174  192    NEUT# 1.7 - 7.7 K/uL 4.9     Lymph# 0.7 - 4.0 K/uL 1.9        There is no height or weight on file to calculate BMI.  Orders:  No orders of the defined types were placed in this encounter.  No orders of the defined types were placed in this encounter.    Procedures: No procedures performed  Clinical Data: No additional findings.  ROS:  All other systems negative, except as noted in the HPI. Review of Systems  Objective: Vital Signs: There were no vitals taken for this visit.  Specialty Comments:  No specialty comments available.  PMFS History: Patient Active Problem List   Diagnosis Date Noted   Abscess of left lower leg 09/25/2024   Abrasion of ankle, left, infected 09/25/2024   Infected hardware in left leg, initial encounter 08/10/2024   Hardware complicating wound infection 08/09/2024   Ankle wound, left, initial encounter 08/09/2024   Itching in the vaginal area 04/18/2024   Polypharmacy 02/27/2024   Hyperlipidemia 02/27/2024   Falls, subsequent encounter 02/07/2024   Anemia 12/21/2023   Insomnia 11/22/2023   Osteoporosis 11/09/2023   Neck pain 08/23/2023   Spinal stenosis, lumbar 08/24/2022   Uncontrolled type 2 diabetes mellitus on insulin  (HCC) 04/11/2022   Morbid obesity (HCC) 04/11/2022   Polysubstance abuse (HCC) 04/11/2022   CKD stage 3b, GFR 30-44 ml/min (HCC) 04/2022    Hyperthyroidism 12/29/2021   Cervical spondylosis with myelopathy and radiculopathy 10/26/2021   Hypertensive retinopathy of both eyes 03/06/2019   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) 03/06/2019   Essential hypertension 10/05/2018   Chronic obstructive pulmonary disease (HCC) 10/05/2018   Chronic hepatitis C (HCC) 12/09/2015   Gastroesophageal reflux disease 05/22/2014   Past Medical History:  Diagnosis Date   Anemia    Anxiety    Arthritis    Asthma    Bacterial vaginosis 05/16/2022   Bunion    Callus    Candida vaginitis 09/16/2022   Cholelithiasis without obstruction 04/11/2022   Chronic pain    Cocaine abuse (HCC)    COPD (chronic obstructive pulmonary disease) (HCC)    Corns and callosities    Degenerative joint disease    Depression    Diabetes  mellitus    Endometrial polyp    ETOH abuse    Gall stones    Gallbladder calculus with acute cholecystitis and no obstruction 04/11/2022   GERD (gastroesophageal reflux disease)    Headache    history of Migraines   Hepatitis C    Hep C   History of adenomatous polyp of colon 04/11/2022   History of endometrial ablation 02/06/2013   Hyperlipidemia    Hypertension    Insomnia    Spondylolisthesis of lumbar region    Substance abuse (HCC)    alcoholism   Tuberculosis 1985   Wears dentures    Wears glasses     Family History  Problem Relation Age of Onset   Heart disease Mother    Diabetes Other        mat great aunt   Cirrhosis Other        mat great aunt   Cirrhosis Other        mat great uncles x 2   Colon cancer Neg Hx    Breast cancer Neg Hx     Past Surgical History:  Procedure Laterality Date   ANTERIOR CERVICAL DECOMP/DISCECTOMY FUSION N/A 10/26/2021   Procedure: ACDF - C4-C5 - C5-C6 - C6-C7;  Surgeon: Louis Shove, MD;  Location: [facility];  Service: Neurosurgery;  Laterality: N/A;   BACK SURGERY  2018   CESAREAN SECTION     x3   CHOLECYSTECTOMY N/A 08/11/2016   Procedure:  LAPAROSCOPIC CHOLECYSTECTOMY WITH   INTRAOPERATIVE CHOLANGIOGRAM;  Surgeon: Jina Nephew, MD;  Location: [facility];  Service: General;  Laterality: N/A;   COLONOSCOPY     Cotton Osteotomy w/ Graft Left 06/18/2009   Excision of Benign Lesion Right 01/30/2013   Rt Plantar   FOOT SURGERY     HAMMER TOE REPAIR Right 02/12/2016   RIGHT #5   Hammertoe Repair Left 06/18/2009   Lt #5   HARDWARE REMOVAL Left 08/09/2024   Procedure: DEBRIDEMENT LEFT ANKLE AND REMOVAL OF HARDWARE;  Surgeon: Harden Jerona GAILS, MD;  Location: [facility];  Service: Orthopedics;  Laterality: Left;  HARDWARE REMOVAL LEFT ANKLE   HYSTEROSCOPY N/A 06/20/2017   Procedure: DILATION AND CURETTAGE, HYSTEROSCOPY w/ Polypectomy;  Surgeon: Corene Coy, MD;  Location: [facility];  Service: Gynecology;  Laterality: N/A;   INCISION AND DRAINAGE OF DEEP ABSCESS, ANKLE Left 09/25/2024   Procedure: INCISION AND DRAINAGE OF LEFT ANKLE;  Surgeon: Harden Jerona GAILS, MD;  Location: [facility];  Service: Orthopedics;  Laterality: Left;   LUMBAR FUSION  2018   METATARSAL OSTEOTOMY Left 06/18/2009   #5   MULTIPLE TOOTH EXTRACTIONS     Nail Matrixectomy Left 06/18/2009   LT #1   ORIF ANKLE FRACTURE Left 07/02/2024   Procedure: OPEN REDUCTION INTERNAL FIXATION (ORIF) ANKLE FRACTURE;  Surgeon: Beverley Evalene BIRCH, MD;  Location: [facility];  Service: Orthopedics;  Laterality: Left;   OSTEOTOMY Right 01/30/2013   Rt #5   Phalangectomy Left 06/18/2009   LT #1   Phalangectomy Right 01/30/2013   Rt #1   TUBAL LIGATION     Social History   Occupational History   Occupation: disablily  Tobacco Use   Smoking status: Former    Current packs/day: 0.00    Average packs/day: 0.8 packs/day for 47.0 years (35.3 ttl pk-yrs)    Types: Cigarettes    Start date: 20    Quit date: 2020    Years since quitting: 5.9    Passive exposure: Past  Smokeless tobacco: Never  Vaping Use   Vaping status: Never Used  Substance and Sexual Activity   Alcohol use:  Not Currently    Comment: no ETOH 6 yrs   Drug use: Not Currently   Sexual activity: Not Currently    Birth control/protection: Post-menopausal         "

## 2024-10-01 NOTE — Transitions of Care (Post Inpatient/ED Visit) (Signed)
" ° °  10/01/2024  Name: Kara Kelley MRN: [id number] DOB: July 05, 1956  Today's TOC FU Call Status: Today's TOC FU Call Status:: Unsuccessful Call (2nd Attempt) Unsuccessful Call (1st Attempt) Date: 09/30/24 Unsuccessful Call (2nd Attempt) Date: 10/01/24  Attempted to reach the patient regarding the most recent Inpatient/ED visit.  Follow Up Plan: Additional outreach attempts will be made to reach the patient to complete the Transitions of Care (Post Inpatient/ED visit) call.   Signature Julian Lemmings, LPN [facility] Nurse Health Advisor Direct Dial  [phone]  "

## 2024-10-07 NOTE — Transitions of Care (Post Inpatient/ED Visit) (Signed)
" ° °  10/07/2024  Name: Kara Kelley MRN: [id number] DOB: 01/31/1956  Today's TOC FU Call Status: Today's TOC FU Call Status:: Unsuccessful Call (3rd Attempt) Unsuccessful Call (1st Attempt) Date: 09/30/24 Unsuccessful Call (2nd Attempt) Date: 10/01/24 Unsuccessful Call (3rd Attempt) Date: 10/07/24  Attempted to reach the patient regarding the most recent Inpatient/ED visit.  Follow Up Plan: No further outreach attempts will be made at this time. We have been unable to contact the patient.  Signature Julian Lemmings, LPN [facility] Nurse Health Advisor Direct Dial  [phone]  "

## 2024-10-11 ENCOUNTER — Telehealth: Payer: Self-pay | Admitting: Radiology

## 2024-10-11 NOTE — Telephone Encounter (Signed)
 Kara Kelley, from [facility] called, states that the patient missed her appointment to day for Re-cert. But they are going out Monday, for this and she is requesting 1x per wk for 8 wks.  Please call her back to advise. Se states it is fine to leave a message if she don't answer. Call back number [phone]

## 2024-10-11 NOTE — Telephone Encounter (Signed)
 I called and sw laura and advised verbal ok for orders as requested below.

## 2024-10-14 ENCOUNTER — Encounter: Admitting: Physician Assistant

## 2024-10-14 ENCOUNTER — Encounter: Payer: Self-pay | Admitting: Physician Assistant

## 2024-10-14 DIAGNOSIS — S90512D Abrasion, left ankle, subsequent encounter: Secondary | ICD-10-CM

## 2024-10-14 NOTE — Progress Notes (Signed)
 "  Office Visit Note   Patient: Kara Kelley           Date of Birth: 10/31/1955           MRN: [id number] Visit Date: 10/14/2024              Requested by: Ricky Alfrieda DASEN, DO [address], [address] [place],  [place] [address] PCP: Ricky Alfrieda DASEN, DO  Chief Complaint  Patient presents with   Left Ankle - Routine Post Op    09/25/2024 left ankle I&D      HPI: Kara Kelley is a 69 year old female s/p ankle hardware removal due to infection on 08/09/24 followed by left ankle irrigation and debridement for infection 09/25/24.   She was given Ancef  pre-op and post op.  She was given Doxycycline  for 15 days and has finished these.  She denies fever and chills.  No drainage.    Assessment & Plan: Visit Diagnoses:  1. Abrasion of left ankle without infection, subsequent encounter     Plan: 2 sutures were removed today proximally.  The residual sutures were left intact to allow for more incision healing.  She is WBAT in the Cam boot.  Elevation when at rest is encouraged.    Follow-Up Instructions: Return in about 2 weeks (around 10/28/2024).   Ortho Exam  Patient is alert, oriented, no adenopathy, well-dressed, normal affect, normal respiratory effort. No cellulitis or active drainage.  There is a small dehiscence of the distal incision I will maintain the sutures.  She has good dorsiflexion and plantar flexion of the ankle.  Minimal edema.  Palpable DP pulse.     Imaging:   Labs: Lab Results  Component Value Date   HGBA1C 6.7 (H) 09/25/2024   HGBA1C 8.6 (A) 06/19/2024   HGBA1C 8.1 (A) 02/27/2024   ESRSEDRATE 22 03/12/2020   ESRSEDRATE 71 (H) 07/18/2017   CRP 20.1 (H) 07/17/2017   REPTSTATUS 09/28/2024 FINAL 09/25/2024   GRAMSTAIN NO WBC SEEN NO ORGANISMS SEEN  09/25/2024   CULT  09/25/2024    RARE FINEGOLDIA MAGNA NO GROWTH AEROBICALLY Performed at [facility], [address] [place], [place] [address]    [facility] STAPHYLOCOCCUS LUGDUNENSIS 08/09/2024      Lab Results  Component Value Date   ALBUMIN 3.7 09/25/2024   ALBUMIN 3.4 (L) 08/09/2024   ALBUMIN 4.3 06/21/2024    Lab Results  Component Value Date   MG 1.6 12/21/2023   MG 1.6 (L) 08/25/2022   MG 1.7 09/24/2019   Lab Results  Component Value Date   VD25OH 53 05/15/2009    No results found for: PREALBUMIN    Latest Ref Rng & Units 09/25/2024    9:16 AM 08/09/2024   10:42 AM 06/21/2024    3:11 PM  CBC EXTENDED  WBC 4.0 - 10.5 K/uL 7.6  5.4    RBC 3.87 - 5.11 MIL/uL 4.02  4.19    Hemoglobin 12.0 - 15.0 g/dL 89.8  89.5  86.0   HCT 36.0 - 46.0 % 31.3  32.8  41.0   Platelets 150 - 400 K/uL 174  192    NEUT# 1.7 - 7.7 K/uL 4.9     Lymph# 0.7 - 4.0 K/uL 1.9        There is no height or weight on file to calculate BMI.  Orders:  No orders of the defined types were placed in this encounter.  No orders of the defined types were  placed in this encounter.    Procedures: No procedures performed  Clinical Data: No additional findings.  ROS:  All other systems negative, except as noted in the HPI. Review of Systems  Objective: Vital Signs: There were no vitals taken for this visit.  Specialty Comments:  No specialty comments available.  PMFS History: Patient Active Problem List   Diagnosis Date Noted   Abscess of left lower leg 09/25/2024   Abrasion of ankle, left, infected 09/25/2024   Infected hardware in left leg, initial encounter 08/10/2024   Hardware complicating wound infection 08/09/2024   Ankle wound, left, initial encounter 08/09/2024   Itching in the vaginal area 04/18/2024   Polypharmacy 02/27/2024   Hyperlipidemia 02/27/2024   Falls, subsequent encounter 02/07/2024   Anemia 12/21/2023   Insomnia 11/22/2023   Osteoporosis 11/09/2023   Neck pain 08/23/2023   Spinal stenosis, lumbar 08/24/2022   Uncontrolled type 2 diabetes mellitus on insulin  (HCC) 04/11/2022   Morbid obesity (HCC) 04/11/2022   Polysubstance abuse (HCC) 04/11/2022    CKD stage 3b, GFR 30-44 ml/min (HCC) 04/2022   Hyperthyroidism 12/29/2021   Cervical spondylosis with myelopathy and radiculopathy 10/26/2021   Hypertensive retinopathy of both eyes 03/06/2019   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) 03/06/2019   Essential hypertension 10/05/2018   Chronic obstructive pulmonary disease (HCC) 10/05/2018   Chronic hepatitis C (HCC) 12/09/2015   Gastroesophageal reflux disease 05/22/2014   Past Medical History:  Diagnosis Date   Anemia    Anxiety    Arthritis    Asthma    Bacterial vaginosis 05/16/2022   Bunion    Callus    Candida vaginitis 09/16/2022   Cholelithiasis without obstruction 04/11/2022   Chronic pain    Cocaine abuse (HCC)    COPD (chronic obstructive pulmonary disease) (HCC)    Corns and callosities    Degenerative joint disease    Depression    Diabetes mellitus    Endometrial polyp    ETOH abuse    Gall stones    Gallbladder calculus with acute cholecystitis and no obstruction 04/11/2022   GERD (gastroesophageal reflux disease)    Headache    history of Migraines   Hepatitis C    Hep C   History of adenomatous polyp of colon 04/11/2022   History of endometrial ablation 02/06/2013   Hyperlipidemia    Hypertension    Insomnia    Spondylolisthesis of lumbar region    Substance abuse (HCC)    alcoholism   Tuberculosis 1985   Wears dentures    Wears glasses     Family History  Problem Relation Age of Onset   Heart disease Mother    Diabetes Other        mat great aunt   Cirrhosis Other        mat great aunt   Cirrhosis Other        mat great uncles x 2   Colon cancer Neg Hx    Breast cancer Neg Hx     Past Surgical History:  Procedure Laterality Date   ANTERIOR CERVICAL DECOMP/DISCECTOMY FUSION N/A 10/26/2021   Procedure: ACDF - C4-C5 - C5-C6 - C6-C7;  Surgeon: Louis Shove, MD;  Location: [facility];  Service: Neurosurgery;  Laterality: N/A;   BACK SURGERY  2018   CESAREAN SECTION     x3    CHOLECYSTECTOMY N/A 08/11/2016   Procedure: LAPAROSCOPIC CHOLECYSTECTOMY WITH   INTRAOPERATIVE CHOLANGIOGRAM;  Surgeon: Jina Nephew, MD;  Location: [facility];  Service: General;  Laterality: N/A;   COLONOSCOPY     Cotton Osteotomy w/ Graft Left 06/18/2009   Excision of Benign Lesion Right 01/30/2013   Rt Plantar   FOOT SURGERY     HAMMER TOE REPAIR Right 02/12/2016   RIGHT #5   Hammertoe Repair Left 06/18/2009   Lt #5   HARDWARE REMOVAL Left 08/09/2024   Procedure: DEBRIDEMENT LEFT ANKLE AND REMOVAL OF HARDWARE;  Surgeon: Harden Jerona GAILS, MD;  Location: [facility] OR;  Service: Orthopedics;  Laterality: Left;  HARDWARE REMOVAL LEFT ANKLE   HYSTEROSCOPY N/A 06/20/2017   Procedure: DILATION AND CURETTAGE, HYSTEROSCOPY w/ Polypectomy;  Surgeon: Corene Coy, MD;  Location: [facility];  Service: Gynecology;  Laterality: N/A;   INCISION AND DRAINAGE OF DEEP ABSCESS, ANKLE Left 09/25/2024   Procedure: INCISION AND DRAINAGE OF LEFT ANKLE;  Surgeon: Harden Jerona GAILS, MD;  Location: [facility];  Service: Orthopedics;  Laterality: Left;   LUMBAR FUSION  2018   METATARSAL OSTEOTOMY Left 06/18/2009   #5   MULTIPLE TOOTH EXTRACTIONS     Nail Matrixectomy Left 06/18/2009   LT #1   ORIF ANKLE FRACTURE Left 07/02/2024   Procedure: OPEN REDUCTION INTERNAL FIXATION (ORIF) ANKLE FRACTURE;  Surgeon: Beverley Evalene BIRCH, MD;  Location: [facility];  Service: Orthopedics;  Laterality: Left;   OSTEOTOMY Right 01/30/2013   Rt #5   Phalangectomy Left 06/18/2009   LT #1   Phalangectomy Right 01/30/2013   Rt #1   TUBAL LIGATION     Social History   Occupational History   Occupation: disablily  Tobacco Use   Smoking status: Former    Current packs/day: 0.00    Average packs/day: 0.8 packs/day for 47.0 years (35.3 ttl pk-yrs)    Types: Cigarettes    Start date: 9    Quit date: 2020    Years since quitting: 6.0    Passive exposure: Past   Smokeless tobacco: Never  Vaping Use   Vaping status: Never Used   Substance and Sexual Activity   Alcohol use: Not Currently    Comment: no ETOH 6 yrs   Drug use: Not Currently   Sexual activity: Not Currently    Birth control/protection: Post-menopausal       "

## 2024-10-18 ENCOUNTER — Telehealth: Payer: Self-pay | Admitting: Orthopedic Surgery

## 2024-10-18 NOTE — Telephone Encounter (Signed)
 Gave VO approval to Leita. She stated that pt is NOT wearing her Cam boot as it is causing her foot to swell. Leita advised that she at least wear it outside of the home. She stated that pt called to report this to us , but I let her know that I don't have any record of her calling to let us  know of this.  I did call the patient on home #. Left her a message to call me back to discuss what Deland advises on the cam boot situation. (Doesn't have to wear it in the home, but needs to elevate while resting and has to wear it outside the home for protection)

## 2024-10-18 NOTE — Telephone Encounter (Signed)
 Kara Kelley with [facility]  call back number [phone]  Request verbal order for: 1) PT once a week for 8 weeks 2) skilled nursing eval for wound care  3) weight bearing status

## 2024-10-21 ENCOUNTER — Encounter: Payer: Self-pay | Admitting: Physician Assistant

## 2024-10-21 ENCOUNTER — Ambulatory Visit: Admitting: Physician Assistant

## 2024-10-21 ENCOUNTER — Telehealth: Payer: Self-pay

## 2024-10-21 DIAGNOSIS — T8131XA Disruption of external operation (surgical) wound, not elsewhere classified, initial encounter: Secondary | ICD-10-CM

## 2024-10-21 DIAGNOSIS — T847XXA Infection and inflammatory reaction due to other internal orthopedic prosthetic devices, implants and grafts, initial encounter: Secondary | ICD-10-CM

## 2024-10-21 NOTE — Telephone Encounter (Signed)
 Patient called concerning her left foot.  Advised her of previous message in chart, but patient stated that the bottom of her foot is hurting.  Stated to call her daughter's phone.  CB# [phone].  Please advise.  Thank you.

## 2024-10-21 NOTE — Progress Notes (Signed)
 "  Office Visit Note   Patient: Kara Kelley           Date of Birth: [birth year]           MRN: [id number] Visit Date: 10/21/2024              Requested by: Ricky Alfrieda DASEN, DO [address] [place],  [place] [address] PCP: Ricky Alfrieda DASEN, DO  Chief Complaint  Patient presents with   Left Ankle - Routine Post Op    Complaining of bottom of left foot hurting      HPI: Kara Kelley is a 69 year old female s/p ankle hardware removal due to infection on 08/09/24 followed by left ankle irrigation and debridement for infection 09/25/24.   She was given Ancef  pre-op and post op.  She was given Doxycycline  for 15 days and has finished these.  She denies fever and chills.  No drainage.    The ankle incision had superficial separation at the incision line on her last visit, so we had her return today after more healing prior to removing the sutures.  She has had some pain along the incision due to the boot rubbing.  She also states the boot is heavy.  She is not quit sure if her daughter she is staying with is changing the dressing correctly.    Assessment & Plan: Visit Diagnoses:  1. Hardware complicating wound infection, initial encounter   2. Surgical wound breakdown, initial encounter     Plan: Post op wound infection after hardware removal Sutures were removed today. She will do wet to dry Vashe dressing changes daily Elevation when at rest laying down Cam boot WBAT   Follow-Up Instructions: Return in about 2 weeks (around 11/04/2024).   Ortho Exam  Patient is alert, oriented, no adenopathy, well-dressed, normal affect, normal respiratory effort. Central incision has skin line separation 1.5 cm and 0.5 cm depth.  No drainage or cellulitis.  She has minimal local edema with tenderness to palpation near the incision.  Palpable DP pulse, active ROM of the ankle and toes are intact without pain.      Imaging:   Labs: Lab Results  Component Value Date   HGBA1C  6.7 (H) 09/25/2024   HGBA1C 8.6 (A) 06/19/2024   HGBA1C 8.1 (A) 02/27/2024   ESRSEDRATE 22 03/12/2020   ESRSEDRATE 71 (H) 07/18/2017   CRP 20.1 (H) 07/17/2017   REPTSTATUS 09/28/2024 FINAL 09/25/2024   GRAMSTAIN NO WBC SEEN NO ORGANISMS SEEN  09/25/2024   CULT  09/25/2024    RARE FINEGOLDIA MAGNA NO GROWTH AEROBICALLY Performed at [facility], [address] [place], [place] [address]    [facility] STAPHYLOCOCCUS LUGDUNENSIS 08/09/2024     Lab Results  Component Value Date   ALBUMIN 3.7 09/25/2024   ALBUMIN 3.4 (L) 08/09/2024   ALBUMIN 4.3 06/21/2024    Lab Results  Component Value Date   MG 1.6 12/21/2023   MG 1.6 (L) 08/25/2022   MG 1.7 09/24/2019   Lab Results  Component Value Date   VD25OH 53 05/15/2009    No results found for: PREALBUMIN    Latest Ref Rng & Units 09/25/2024    9:16 AM 08/09/2024   10:42 AM 06/21/2024    3:11 PM  CBC EXTENDED  WBC 4.0 - 10.5 K/uL 7.6  5.4    RBC 3.87 - 5.11 MIL/uL 4.02  4.19    Hemoglobin 12.0 - 15.0 g/dL 89.8  89.5  13.9   HCT 36.0 - 46.0 % 31.3  32.8  41.0   Platelets 150 - 400 K/uL 174  192    NEUT# 1.7 - 7.7 K/uL 4.9     Lymph# 0.7 - 4.0 K/uL 1.9        There is no height or weight on file to calculate BMI.  Orders:  No orders of the defined types were placed in this encounter.  No orders of the defined types were placed in this encounter.    Procedures: No procedures performed  Clinical Data: No additional findings.  ROS:  All other systems negative, except as noted in the HPI. Review of Systems  Objective: Vital Signs: There were no vitals taken for this visit.  Specialty Comments:  No specialty comments available.  PMFS History: Patient Active Problem List   Diagnosis Date Noted   Abscess of left lower leg 09/25/2024   Abrasion of ankle, left, infected 09/25/2024   Infected hardware in left leg, initial encounter 08/10/2024   Hardware complicating wound infection 08/09/2024   Ankle  wound, left, initial encounter 08/09/2024   Itching in the vaginal area 04/18/2024   Polypharmacy 02/27/2024   Hyperlipidemia 02/27/2024   Falls, subsequent encounter 02/07/2024   Anemia 12/21/2023   Insomnia 11/22/2023   Osteoporosis 11/09/2023   Neck pain 08/23/2023   Spinal stenosis, lumbar 08/24/2022   Uncontrolled type 2 diabetes mellitus on insulin  (HCC) 04/11/2022   Morbid obesity (HCC) 04/11/2022   Polysubstance abuse (HCC) 04/11/2022   CKD stage 3b, GFR 30-44 ml/min (HCC) 04/2022   Hyperthyroidism 12/29/2021   Cervical spondylosis with myelopathy and radiculopathy 10/26/2021   Hypertensive retinopathy of both eyes 03/06/2019   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) 03/06/2019   Essential hypertension 10/05/2018   Chronic obstructive pulmonary disease (HCC) 10/05/2018   Chronic hepatitis C (HCC) 12/09/2015   Gastroesophageal reflux disease 05/22/2014   Past Medical History:  Diagnosis Date   Anemia    Anxiety    Arthritis    Asthma    Bacterial vaginosis 05/16/2022   Bunion    Callus    Candida vaginitis 09/16/2022   Cholelithiasis without obstruction 04/11/2022   Chronic pain    Cocaine abuse (HCC)    COPD (chronic obstructive pulmonary disease) (HCC)    Corns and callosities    Degenerative joint disease    Depression    Diabetes mellitus    Endometrial polyp    ETOH abuse    Gall stones    Gallbladder calculus with acute cholecystitis and no obstruction 04/11/2022   GERD (gastroesophageal reflux disease)    Headache    history of Migraines   Hepatitis C    Hep C   History of adenomatous polyp of colon 04/11/2022   History of endometrial ablation 02/06/2013   Hyperlipidemia    Hypertension    Insomnia    Spondylolisthesis of lumbar region    Substance abuse (HCC)    alcoholism   Tuberculosis 1985   Wears dentures    Wears glasses     Family History  Problem Relation Age of Onset   Heart disease Mother    Diabetes Other         mat great aunt   Cirrhosis Other        mat great aunt   Cirrhosis Other        mat great uncles x 2   Colon cancer Neg Hx    Breast cancer Neg Hx  Past Surgical History:  Procedure Laterality Date   ANTERIOR CERVICAL DECOMP/DISCECTOMY FUSION N/A 10/26/2021   Procedure: ACDF - C4-C5 - C5-C6 - C6-C7;  Surgeon: Louis Shove, MD;  Location: [facility];  Service: Neurosurgery;  Laterality: N/A;   BACK SURGERY  2018   CESAREAN SECTION     x3   CHOLECYSTECTOMY N/A 08/11/2016   Procedure: LAPAROSCOPIC CHOLECYSTECTOMY WITH   INTRAOPERATIVE CHOLANGIOGRAM;  Surgeon: Jina Nephew, MD;  Location: [facility];  Service: General;  Laterality: N/A;   COLONOSCOPY     Cotton Osteotomy w/ Graft Left 06/18/2009   Excision of Benign Lesion Right 01/30/2013   Rt Plantar   FOOT SURGERY     HAMMER TOE REPAIR Right 02/12/2016   RIGHT #5   Hammertoe Repair Left 06/18/2009   Lt #5   HARDWARE REMOVAL Left 08/09/2024   Procedure: DEBRIDEMENT LEFT ANKLE AND REMOVAL OF HARDWARE;  Surgeon: Harden Jerona GAILS, MD;  Location: [facility];  Service: Orthopedics;  Laterality: Left;  HARDWARE REMOVAL LEFT ANKLE   HYSTEROSCOPY N/A 06/20/2017   Procedure: DILATION AND CURETTAGE, HYSTEROSCOPY w/ Polypectomy;  Surgeon: Corene Coy, MD;  Location: [facility];  Service: Gynecology;  Laterality: N/A;   INCISION AND DRAINAGE OF DEEP ABSCESS, ANKLE Left 09/25/2024   Procedure: INCISION AND DRAINAGE OF LEFT ANKLE;  Surgeon: Harden Jerona GAILS, MD;  Location: [facility];  Service: Orthopedics;  Laterality: Left;   LUMBAR FUSION  2018   METATARSAL OSTEOTOMY Left 06/18/2009   #5   MULTIPLE TOOTH EXTRACTIONS     Nail Matrixectomy Left 06/18/2009   LT #1   ORIF ANKLE FRACTURE Left 07/02/2024   Procedure: OPEN REDUCTION INTERNAL FIXATION (ORIF) ANKLE FRACTURE;  Surgeon: Beverley Evalene BIRCH, MD;  Location: [facility];  Service: Orthopedics;  Laterality: Left;   OSTEOTOMY Right 01/30/2013   Rt #5   Phalangectomy Left 06/18/2009   LT #1    Phalangectomy Right 01/30/2013   Rt #1   TUBAL LIGATION     Social History   Occupational History   Occupation: disablily  Tobacco Use   Smoking status: Former    Current packs/day: 0.00    Average packs/day: 0.8 packs/day for 47.0 years (35.3 ttl pk-yrs)    Types: Cigarettes    Start date: 30    Quit date: 2020    Years since quitting: 6.0    Passive exposure: Past   Smokeless tobacco: Never  Vaping Use   Vaping status: Never Used  Substance and Sexual Activity   Alcohol use: Not Currently    Comment: no ETOH 6 yrs   Drug use: Not Currently   Sexual activity: Not Currently    Birth control/protection: Post-menopausal       "

## 2024-10-21 NOTE — Telephone Encounter (Signed)
 Called daughter, said she spoke with someone a few minutes ago stating that she has an appt today at 1:45 pm. I said I am sorry for the extra call, I am calling in regards to the foot pain and wanted to follow up on that aspect. I checked the appt's and she didn't have an appt today it was for next Monday on 10/28/24. I let her know this and her daughter was demanding stating well what are we going to do about the incident that happened this weekend? I let her know that I wasn't aware of an incident due to the message I received only stated that the bottom of her left foot hurt. She said can we get an appointment then? I did let her know that yes I can work her in that was what I was trying to do for further eval of the situation. She is scheduled today at 2:15 pm.

## 2024-10-28 ENCOUNTER — Encounter: Admitting: Physician Assistant

## 2024-11-04 ENCOUNTER — Encounter: Admitting: Physician Assistant

## 2024-11-07 ENCOUNTER — Encounter: Payer: Self-pay | Admitting: Physician Assistant

## 2024-11-07 ENCOUNTER — Other Ambulatory Visit: Payer: Self-pay

## 2024-11-07 ENCOUNTER — Ambulatory Visit: Admitting: Physician Assistant

## 2024-11-07 DIAGNOSIS — T847XXA Infection and inflammatory reaction due to other internal orthopedic prosthetic devices, implants and grafts, initial encounter: Secondary | ICD-10-CM

## 2024-11-07 NOTE — Progress Notes (Signed)
 "  Office Visit Note   Patient: Kara Kelley           Date of Birth: [birth year]           MRN: [id number] Visit Date: 11/07/2024              Requested by: Ricky Alfrieda DASEN, DO [address] [place],  [place] [address] PCP: Ricky Alfrieda DASEN, DO  Chief Complaint  Patient presents with   Left Ankle - Routine Post Op    09/25/2024 I&D left ankle       HPI: Kara Kelley is a 69 year old female s/p ankle hardware removal due to infection on 08/09/24 followed by left ankle irrigation and debridement for infection 09/25/24.   She was given Ancef  pre-op and post op.  She was given Doxycycline  for 15 days and has finished these.  She denies fever and chills.  No drainage.    She has no new complaints and is performing daily dressing changes with Vashe.    Assessment & Plan: Visit Diagnoses:  1. Hardware complicating wound infection, initial encounter     Plan: Dressing daily with Vashe and band aide She may wean out of the boot into a comfortable shoe at this time.     Follow-Up Instructions: Return in about 3 weeks (around 11/28/2024).   Ortho Exam  Patient is alert, oriented, no adenopathy, well-dressed, normal affect, normal respiratory effort. Left lateral ankle incision has well approximated skin lines with 0.3 mm superficial area.  No edema or cellulitis.  No signs of infection.    X ray AP/Lat ankle shows well healed fracture medial malleolus without hardware lucency and the fibula has healed with good bone callus formation.    Imaging: No results found.   Labs: Lab Results  Component Value Date   HGBA1C 6.7 (H) 09/25/2024   HGBA1C 8.6 (A) 06/19/2024   HGBA1C 8.1 (A) 02/27/2024   ESRSEDRATE 22 03/12/2020   ESRSEDRATE 71 (H) 07/18/2017   CRP 20.1 (H) 07/17/2017   REPTSTATUS 09/28/2024 FINAL 09/25/2024   GRAMSTAIN NO WBC SEEN NO ORGANISMS SEEN  09/25/2024   CULT  09/25/2024    RARE FINEGOLDIA MAGNA NO GROWTH AEROBICALLY Performed at [facility], [address], [place], [place] [address]    [facility] STAPHYLOCOCCUS LUGDUNENSIS 08/09/2024     Lab Results  Component Value Date   ALBUMIN 3.7 09/25/2024   ALBUMIN 3.4 (L) 08/09/2024   ALBUMIN 4.3 06/21/2024    Lab Results  Component Value Date   MG 1.6 12/21/2023   MG 1.6 (L) 08/25/2022   MG 1.7 09/24/2019   Lab Results  Component Value Date   VD25OH 53 05/15/2009    No results found for: PREALBUMIN    Latest Ref Rng & Units 09/25/2024    9:16 AM 08/09/2024   10:42 AM 06/21/2024    3:11 PM  CBC EXTENDED  WBC 4.0 - 10.5 K/uL 7.6  5.4    RBC 3.87 - 5.11 MIL/uL 4.02  4.19    Hemoglobin 12.0 - 15.0 g/dL 89.8  89.5  86.0   HCT 36.0 - 46.0 % 31.3  32.8  41.0   Platelets 150 - 400 K/uL 174  192    NEUT# 1.7 - 7.7 K/uL 4.9     Lymph# 0.7 - 4.0 K/uL 1.9        There is no height or weight on file to calculate BMI.  Orders:  Orders Placed  This Encounter  Procedures   XR Ankle 2 Views Left   No orders of the defined types were placed in this encounter.    Procedures: No procedures performed  Clinical Data: No additional findings.  ROS:  All other systems negative, except as noted in the HPI. Review of Systems  Objective: Vital Signs: There were no vitals taken for this visit.  Specialty Comments:  No specialty comments available.  PMFS History: Patient Active Problem List   Diagnosis Date Noted   Abscess of left lower leg 09/25/2024   Abrasion of ankle, left, infected 09/25/2024   Infected hardware in left leg, initial encounter 08/10/2024   Hardware complicating wound infection 08/09/2024   Ankle wound, left, initial encounter 08/09/2024   Itching in the vaginal area 04/18/2024   Polypharmacy 02/27/2024   Hyperlipidemia 02/27/2024   Falls, subsequent encounter 02/07/2024   Anemia 12/21/2023   Insomnia 11/22/2023   Osteoporosis 11/09/2023   Neck pain 08/23/2023   Spinal stenosis, lumbar 08/24/2022   Uncontrolled type 2 diabetes  mellitus on insulin  (HCC) 04/11/2022   Morbid obesity (HCC) 04/11/2022   Polysubstance abuse (HCC) 04/11/2022   CKD stage 3b, GFR 30-44 ml/min (HCC) 04/2022   Hyperthyroidism 12/29/2021   Cervical spondylosis with myelopathy and radiculopathy 10/26/2021   Hypertensive retinopathy of both eyes 03/06/2019   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) 03/06/2019   Essential hypertension 10/05/2018   Chronic obstructive pulmonary disease (HCC) 10/05/2018   Chronic hepatitis C (HCC) 12/09/2015   Gastroesophageal reflux disease 05/22/2014   Past Medical History:  Diagnosis Date   Anemia    Anxiety    Arthritis    Asthma    Bacterial vaginosis 05/16/2022   Bunion    Callus    Candida vaginitis 09/16/2022   Cholelithiasis without obstruction 04/11/2022   Chronic pain    Cocaine abuse (HCC)    COPD (chronic obstructive pulmonary disease) (HCC)    Corns and callosities    Degenerative joint disease    Depression    Diabetes mellitus    Endometrial polyp    ETOH abuse    Gall stones    Gallbladder calculus with acute cholecystitis and no obstruction 04/11/2022   GERD (gastroesophageal reflux disease)    Headache    history of Migraines   Hepatitis C    Hep C   History of adenomatous polyp of colon 04/11/2022   History of endometrial ablation 02/06/2013   Hyperlipidemia    Hypertension    Insomnia    Spondylolisthesis of lumbar region    Substance abuse (HCC)    alcoholism   Tuberculosis 1985   Wears dentures    Wears glasses     Family History  Problem Relation Age of Onset   Heart disease Mother    Diabetes Other        mat great aunt   Cirrhosis Other        mat great aunt   Cirrhosis Other        mat great uncles x 2   Colon cancer Neg Hx    Breast cancer Neg Hx     Past Surgical History:  Procedure Laterality Date   ANTERIOR CERVICAL DECOMP/DISCECTOMY FUSION N/A 10/26/2021   Procedure: ACDF - C4-C5 - C5-C6 - C6-C7;  Surgeon: Louis Shove,  MD;  Location: [facility];  Service: Neurosurgery;  Laterality: N/A;   BACK SURGERY  2018   CESAREAN SECTION     x3   CHOLECYSTECTOMY N/A 08/11/2016  Procedure: LAPAROSCOPIC CHOLECYSTECTOMY WITH   INTRAOPERATIVE CHOLANGIOGRAM;  Surgeon: Jina Nephew, MD;  Location: [facility];  Service: General;  Laterality: N/A;   COLONOSCOPY     Cotton Osteotomy w/ Graft Left 06/18/2009   Excision of Benign Lesion Right 01/30/2013   Rt Plantar   FOOT SURGERY     HAMMER TOE REPAIR Right 02/12/2016   RIGHT #5   Hammertoe Repair Left 06/18/2009   Lt #5   HARDWARE REMOVAL Left 08/09/2024   Procedure: DEBRIDEMENT LEFT ANKLE AND REMOVAL OF HARDWARE;  Surgeon: Harden Jerona GAILS, MD;  Location: [facility];  Service: Orthopedics;  Laterality: Left;  HARDWARE REMOVAL LEFT ANKLE   HYSTEROSCOPY N/A 06/20/2017   Procedure: DILATION AND CURETTAGE, HYSTEROSCOPY w/ Polypectomy;  Surgeon: Corene Coy, MD;  Location: [facility];  Service: Gynecology;  Laterality: N/A;   INCISION AND DRAINAGE OF DEEP ABSCESS, ANKLE Left 09/25/2024   Procedure: INCISION AND DRAINAGE OF LEFT ANKLE;  Surgeon: Harden Jerona GAILS, MD;  Location: [facility];  Service: Orthopedics;  Laterality: Left;   LUMBAR FUSION  2018   METATARSAL OSTEOTOMY Left 06/18/2009   #5   MULTIPLE TOOTH EXTRACTIONS     Nail Matrixectomy Left 06/18/2009   LT #1   ORIF ANKLE FRACTURE Left 07/02/2024   Procedure: OPEN REDUCTION INTERNAL FIXATION (ORIF) ANKLE FRACTURE;  Surgeon: Beverley Evalene BIRCH, MD;  Location: [facility];  Service: Orthopedics;  Laterality: Left;   OSTEOTOMY Right 01/30/2013   Rt #5   Phalangectomy Left 06/18/2009   LT #1   Phalangectomy Right 01/30/2013   Rt #1   TUBAL LIGATION     Social History   Occupational History   Occupation: disablily  Tobacco Use   Smoking status: Former    Current packs/day: 0.00    Average packs/day: 0.8 packs/day for 47.0 years (35.3 ttl pk-yrs)    Types: Cigarettes    Start date: 64    Quit date: 2020    Years since  quitting: 6.0    Passive exposure: Past   Smokeless tobacco: Never  Vaping Use   Vaping status: Never Used  Substance and Sexual Activity   Alcohol use: Not Currently    Comment: no ETOH 6 yrs   Drug use: Not Currently   Sexual activity: Not Currently    Birth control/protection: Post-menopausal       "

## 2024-11-15 ENCOUNTER — Other Ambulatory Visit: Payer: Self-pay | Admitting: Student

## 2024-11-15 DIAGNOSIS — G4700 Insomnia, unspecified: Secondary | ICD-10-CM

## 2024-11-15 NOTE — Telephone Encounter (Signed)
 NO LONGER [facility] PATIENT

## 2024-11-21 ENCOUNTER — Ambulatory Visit: Admitting: Neurology

## 2024-11-26 ENCOUNTER — Other Ambulatory Visit (HOSPITAL_BASED_OUTPATIENT_CLINIC_OR_DEPARTMENT_OTHER)

## 2024-11-28 ENCOUNTER — Encounter: Admitting: Physician Assistant
